# Patient Record
Sex: Female | Born: 1945
Health system: Southern US, Community
[De-identification: ages and names within clinical notes are randomized; demographics above are authoritative.]

## PROBLEM LIST (undated history)

## (undated) DIAGNOSIS — K56609 Unspecified intestinal obstruction, unspecified as to partial versus complete obstruction: Secondary | ICD-10-CM

## (undated) DIAGNOSIS — F329 Major depressive disorder, single episode, unspecified: Secondary | ICD-10-CM

## (undated) DIAGNOSIS — Z87442 Personal history of urinary calculi: Secondary | ICD-10-CM

## (undated) DIAGNOSIS — G43711 Chronic migraine without aura, intractable, with status migrainosus: Secondary | ICD-10-CM

## (undated) DIAGNOSIS — F419 Anxiety disorder, unspecified: Secondary | ICD-10-CM

## (undated) DIAGNOSIS — D72829 Elevated white blood cell count, unspecified: Secondary | ICD-10-CM

## (undated) DIAGNOSIS — M48061 Spinal stenosis, lumbar region without neurogenic claudication: Secondary | ICD-10-CM

## (undated) DIAGNOSIS — I071 Rheumatic tricuspid insufficiency: Secondary | ICD-10-CM

## (undated) DIAGNOSIS — J449 Chronic obstructive pulmonary disease, unspecified: Secondary | ICD-10-CM

## (undated) DIAGNOSIS — G459 Transient cerebral ischemic attack, unspecified: Secondary | ICD-10-CM

## (undated) DIAGNOSIS — E559 Vitamin D deficiency, unspecified: Secondary | ICD-10-CM

## (undated) DIAGNOSIS — M1712 Unilateral primary osteoarthritis, left knee: Secondary | ICD-10-CM

## (undated) DIAGNOSIS — R109 Unspecified abdominal pain: Secondary | ICD-10-CM

## (undated) DIAGNOSIS — R519 Headache, unspecified: Secondary | ICD-10-CM

## (undated) DIAGNOSIS — E538 Deficiency of other specified B group vitamins: Secondary | ICD-10-CM

## (undated) DIAGNOSIS — G5600 Carpal tunnel syndrome, unspecified upper limb: Secondary | ICD-10-CM

## (undated) DIAGNOSIS — K921 Melena: Secondary | ICD-10-CM

## (undated) DIAGNOSIS — I639 Cerebral infarction, unspecified: Secondary | ICD-10-CM

## (undated) DIAGNOSIS — Z7901 Long term (current) use of anticoagulants: Secondary | ICD-10-CM

## (undated) DIAGNOSIS — H269 Unspecified cataract: Secondary | ICD-10-CM

## (undated) DIAGNOSIS — G8929 Other chronic pain: Secondary | ICD-10-CM

## (undated) DIAGNOSIS — K5090 Crohn's disease, unspecified, without complications: Secondary | ICD-10-CM

## (undated) DIAGNOSIS — I700 Atherosclerosis of aorta: Secondary | ICD-10-CM

## (undated) DIAGNOSIS — Z9081 Acquired absence of spleen: Secondary | ICD-10-CM

## (undated) DIAGNOSIS — G4730 Sleep apnea, unspecified: Secondary | ICD-10-CM

## (undated) DIAGNOSIS — K219 Gastro-esophageal reflux disease without esophagitis: Secondary | ICD-10-CM

## (undated) DIAGNOSIS — F32A Depression, unspecified: Secondary | ICD-10-CM

## (undated) DIAGNOSIS — Z86718 Personal history of other venous thrombosis and embolism: Secondary | ICD-10-CM

## (undated) DIAGNOSIS — J189 Pneumonia, unspecified organism: Secondary | ICD-10-CM

## (undated) DIAGNOSIS — M1990 Unspecified osteoarthritis, unspecified site: Secondary | ICD-10-CM

## (undated) DIAGNOSIS — Z8719 Personal history of other diseases of the digestive system: Secondary | ICD-10-CM

## (undated) HISTORY — DX: Unspecified intestinal obstruction, unspecified as to partial versus complete obstruction: K56.609

## (undated) HISTORY — PX: APPENDECTOMY: SHX54

## (undated) HISTORY — PX: VAGINAL HYSTERECTOMY: SHX2639

## (undated) HISTORY — DX: Personal history of other venous thrombosis and embolism: Z86.718

## (undated) HISTORY — PX: CHOLECYSTECTOMY: SHX55

## (undated) HISTORY — PX: TONSILLECTOMY: SUR1361

## (undated) HISTORY — PX: CARPAL TUNNEL RELEASE: SHX101

## (undated) HISTORY — DX: Unspecified abdominal pain: R10.9

## (undated) HISTORY — PX: GIVENS CAPSULE STUDY: SHX5432

## (undated) HISTORY — PX: ESOPHAGOGASTRODUODENOSCOPY: SHX1529

## (undated) HISTORY — DX: Crohn's disease, unspecified, without complications: K50.90

## (undated) HISTORY — PX: ROTATOR CUFF REPAIR: SHX139

## (undated) HISTORY — PX: COLONOSCOPY: SHX174

## (undated) HISTORY — PX: INSERTION OF VENA CAVA FILTER: SHX5871

## (undated) HISTORY — PX: TONSILLECTOMY AND ADENOIDECTOMY: SUR1326

## (undated) HISTORY — PX: NISSEN FUNDOPLICATION: SHX2091

## (undated) HISTORY — PX: CATARACT EXTRACTION: SUR2

## (undated) HISTORY — PX: OTHER SURGICAL HISTORY: SHX169

## (undated) HISTORY — PX: ABDOMINAL HYSTERECTOMY: SHX81

## (undated) HISTORY — PX: ANAL SPHINCTER PROSTHESIS PLACEMENT: SUR1036

## (undated) HISTORY — PX: BACK SURGERY: SHX140

## (undated) HISTORY — DX: Other chronic pain: G89.29

## (undated) HISTORY — PX: SPLENECTOMY: SUR1306

---

## 1997-06-14 DIAGNOSIS — Z9889 Other specified postprocedural states: Secondary | ICD-10-CM

## 1997-06-14 HISTORY — DX: Other specified postprocedural states: Z98.890

## 1997-06-14 HISTORY — PX: CARDIAC CATHETERIZATION: SHX172

## 1999-11-12 DIAGNOSIS — K56609 Unspecified intestinal obstruction, unspecified as to partial versus complete obstruction: Secondary | ICD-10-CM

## 1999-11-12 HISTORY — DX: Unspecified intestinal obstruction, unspecified as to partial versus complete obstruction: K56.609

## 2000-08-28 ENCOUNTER — Encounter: Admission: RE | Admit: 2000-08-28 | Discharge: 2000-08-28 | Payer: Self-pay | Admitting: Family Medicine

## 2000-08-28 ENCOUNTER — Encounter: Payer: Self-pay | Admitting: Family Medicine

## 2001-01-27 ENCOUNTER — Encounter: Payer: Self-pay | Admitting: Family Medicine

## 2001-01-27 ENCOUNTER — Ambulatory Visit (HOSPITAL_COMMUNITY): Admission: RE | Admit: 2001-01-27 | Discharge: 2001-01-27 | Payer: Self-pay | Admitting: Family Medicine

## 2003-04-06 ENCOUNTER — Ambulatory Visit (HOSPITAL_COMMUNITY): Admission: RE | Admit: 2003-04-06 | Discharge: 2003-04-06 | Payer: Self-pay | Admitting: Urology

## 2003-04-06 ENCOUNTER — Encounter: Payer: Self-pay | Admitting: Urology

## 2003-11-12 HISTORY — PX: CERVICAL FUSION: SHX112

## 2004-08-11 ENCOUNTER — Ambulatory Visit: Payer: Self-pay | Admitting: Internal Medicine

## 2004-08-16 ENCOUNTER — Ambulatory Visit: Payer: Self-pay

## 2004-08-19 ENCOUNTER — Emergency Department: Payer: Self-pay | Admitting: Emergency Medicine

## 2004-09-13 ENCOUNTER — Emergency Department (HOSPITAL_COMMUNITY): Admission: EM | Admit: 2004-09-13 | Discharge: 2004-09-13 | Payer: Self-pay | Admitting: Family Medicine

## 2004-09-19 ENCOUNTER — Ambulatory Visit: Payer: Self-pay | Admitting: Specialist

## 2004-11-01 ENCOUNTER — Ambulatory Visit (HOSPITAL_COMMUNITY): Admission: RE | Admit: 2004-11-01 | Discharge: 2004-11-01 | Payer: Self-pay | Admitting: Neurosurgery

## 2004-11-07 ENCOUNTER — Inpatient Hospital Stay (HOSPITAL_COMMUNITY): Admission: RE | Admit: 2004-11-07 | Discharge: 2004-11-09 | Payer: Self-pay | Admitting: Neurosurgery

## 2004-12-24 ENCOUNTER — Inpatient Hospital Stay: Payer: Self-pay | Admitting: Unknown Physician Specialty

## 2004-12-30 ENCOUNTER — Ambulatory Visit: Payer: Self-pay | Admitting: Internal Medicine

## 2005-01-22 ENCOUNTER — Ambulatory Visit: Payer: Self-pay | Admitting: Unknown Physician Specialty

## 2005-02-18 ENCOUNTER — Ambulatory Visit (HOSPITAL_BASED_OUTPATIENT_CLINIC_OR_DEPARTMENT_OTHER): Admission: RE | Admit: 2005-02-18 | Discharge: 2005-02-18 | Payer: Self-pay | Admitting: Orthopedic Surgery

## 2005-05-04 ENCOUNTER — Inpatient Hospital Stay: Payer: Self-pay | Admitting: Gastroenterology

## 2005-08-07 ENCOUNTER — Ambulatory Visit: Payer: Self-pay | Admitting: Unknown Physician Specialty

## 2005-08-28 ENCOUNTER — Ambulatory Visit: Payer: Self-pay | Admitting: Unknown Physician Specialty

## 2005-10-18 ENCOUNTER — Inpatient Hospital Stay: Payer: Self-pay | Admitting: Unknown Physician Specialty

## 2006-01-29 ENCOUNTER — Ambulatory Visit: Payer: Self-pay | Admitting: Unknown Physician Specialty

## 2006-02-10 ENCOUNTER — Inpatient Hospital Stay: Payer: Self-pay | Admitting: Unknown Physician Specialty

## 2006-02-25 IMAGING — CT CT HEAD WITHOUT AND WITH CONTRAST
1 series · 16 of 29 positions shown, 20 images · non-contrast
Comparison: none

REASON FOR EXAM: Headache, neuralgia, neuritis
COMMENTS:

[Series 2: without 5.0 h40s · axial · non-contrast · 0.39mm/px · z∈[-42,+88]mm · 16 of 29 slices shown, 20 images]
[im 2/29  brain]
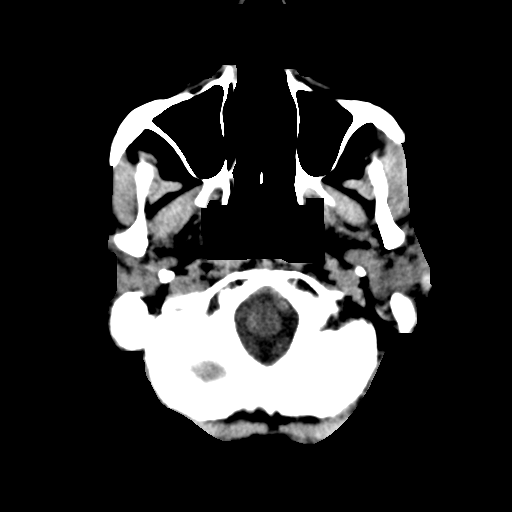
[im 2/29  bone]
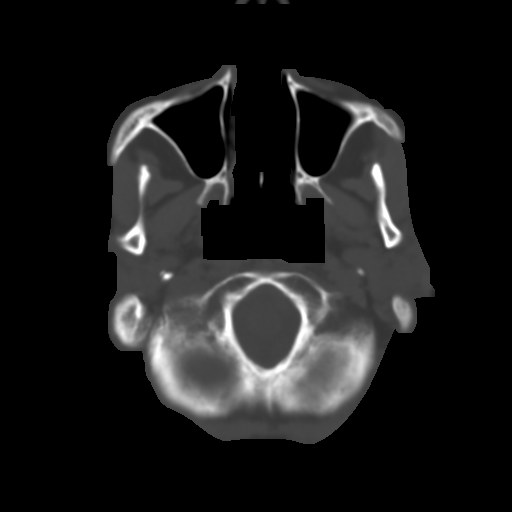
[im 4/29  brain]
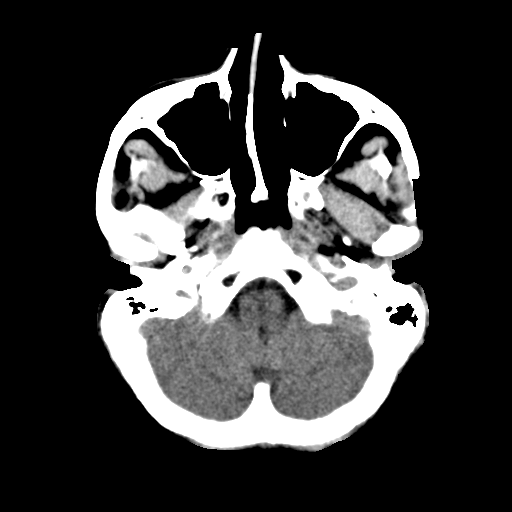
[im 6/29  brain]
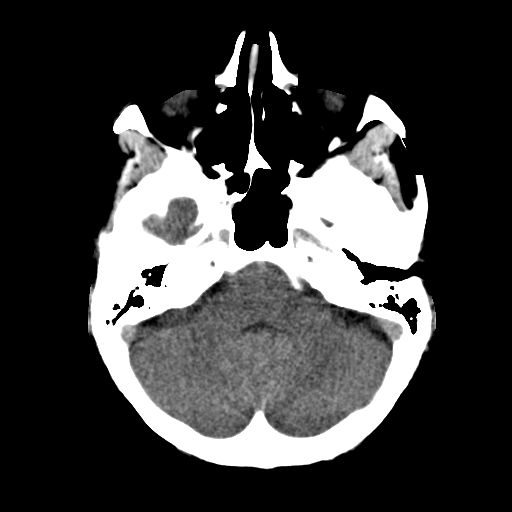
[im 7/29  brain]
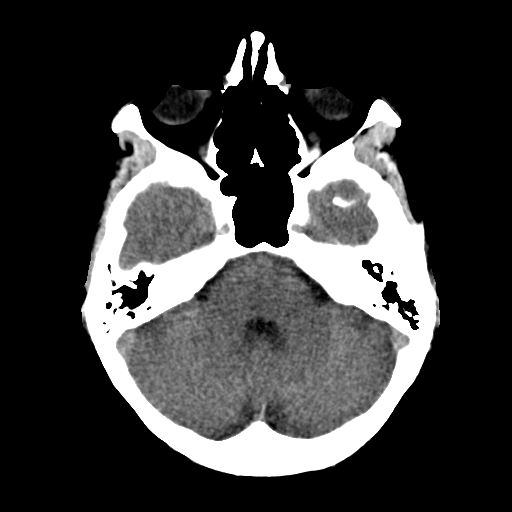
[im 9/29  brain]
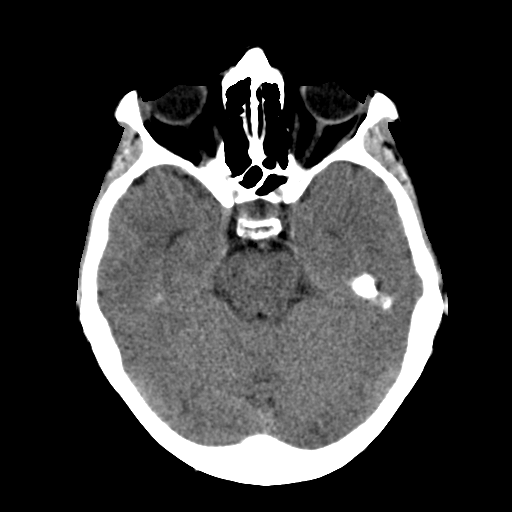
[im 9/29  bone]
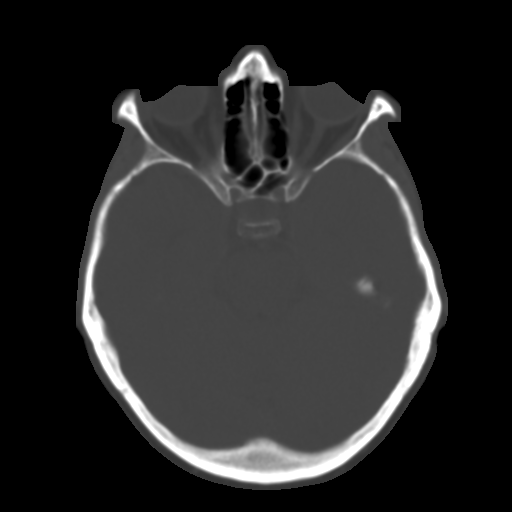
[im 11/29  brain]
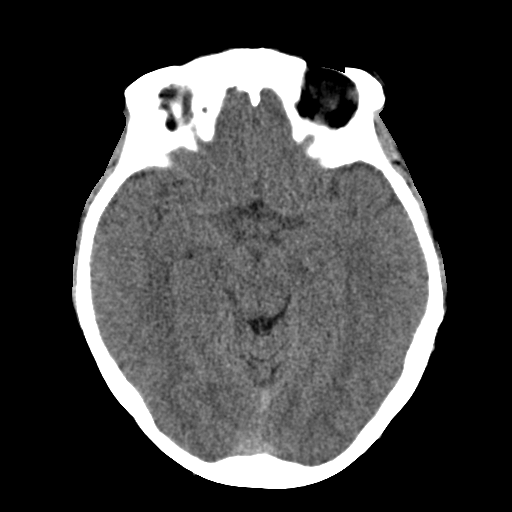
[im 12/29  brain]
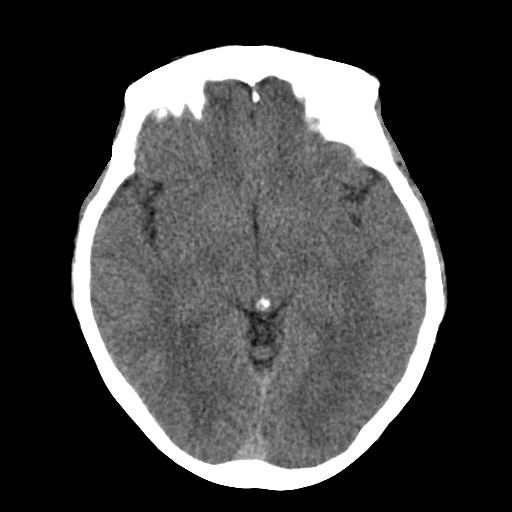
[im 14/29  brain]
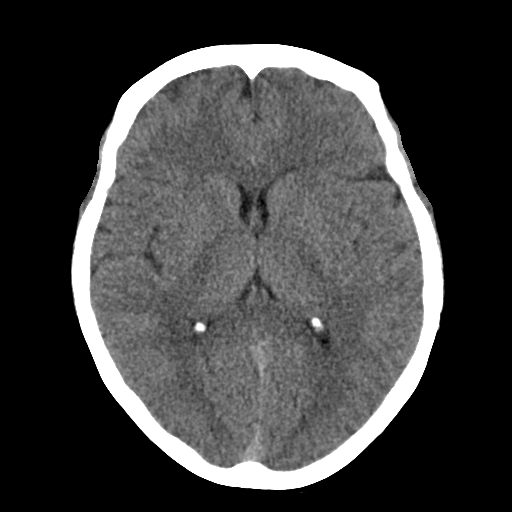
[im 16/29  brain]
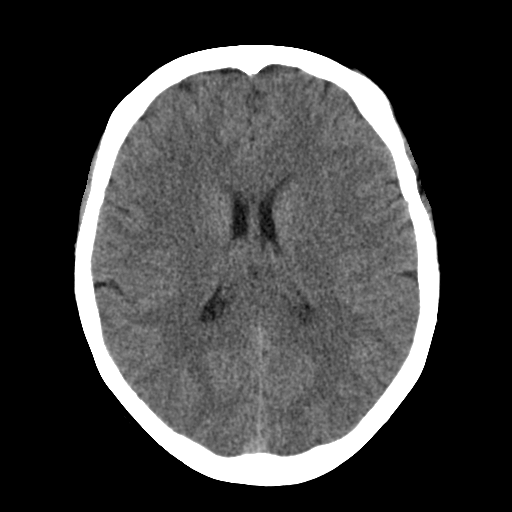
[im 16/29  bone]
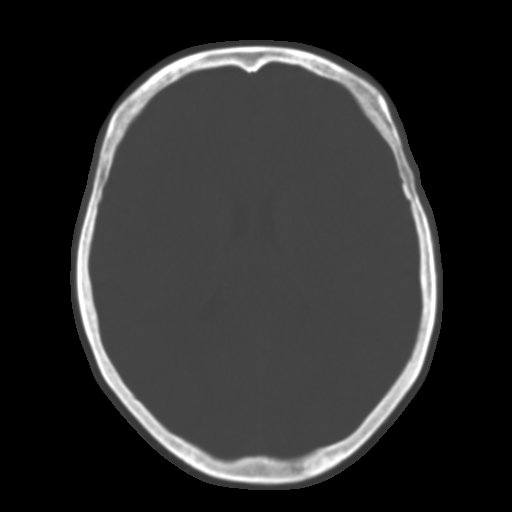
[im 18/29  brain]
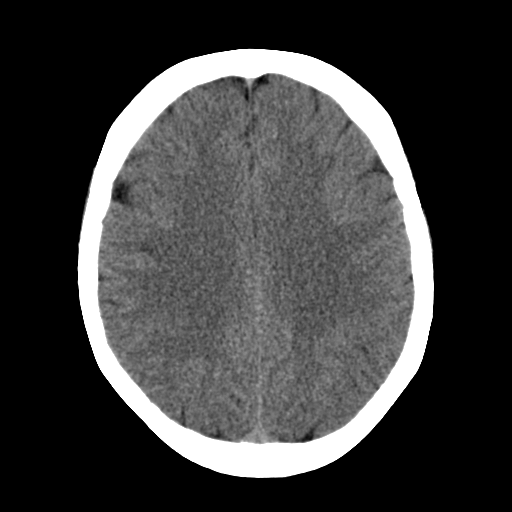
[im 19/29  brain]
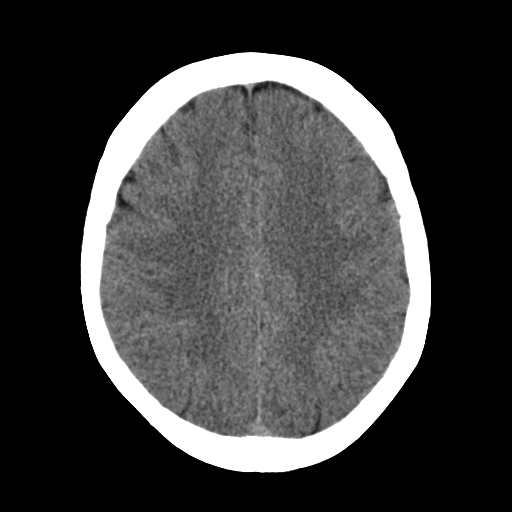
[im 21/29  brain]
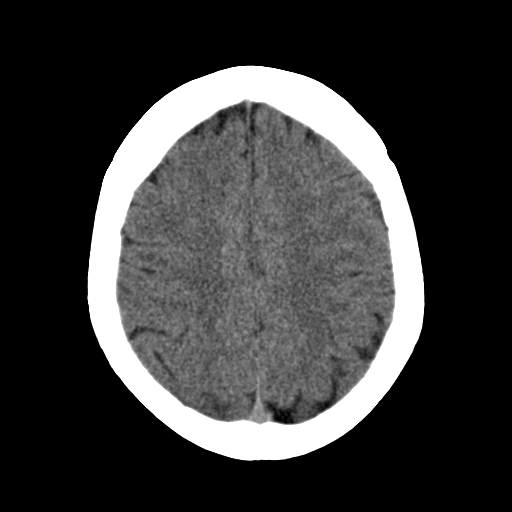
[im 23/29  brain]
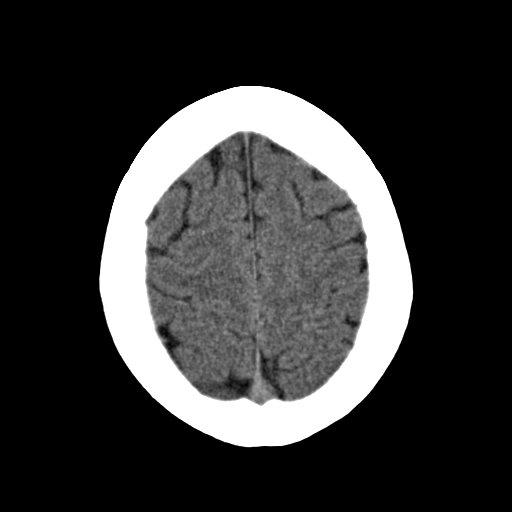
[im 23/29  bone]
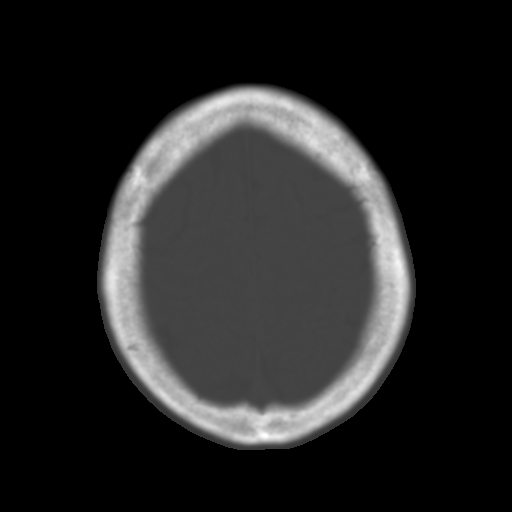
[im 24/29  brain]
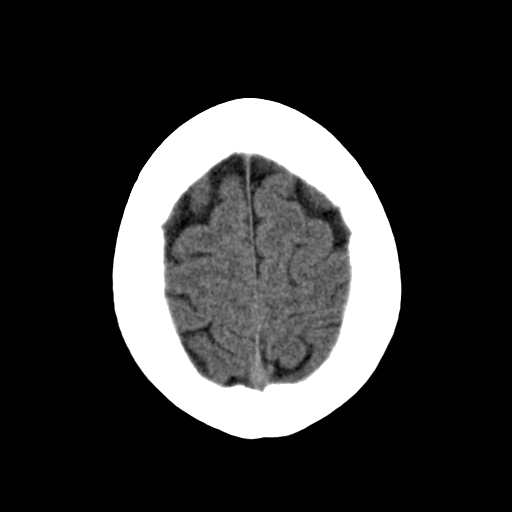
[im 26/29  brain]
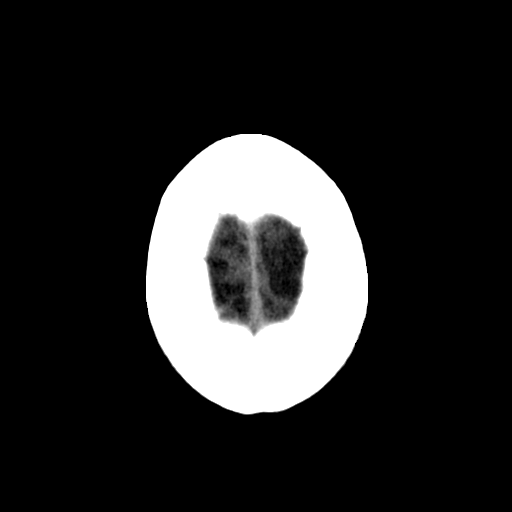
[im 28/29  brain]
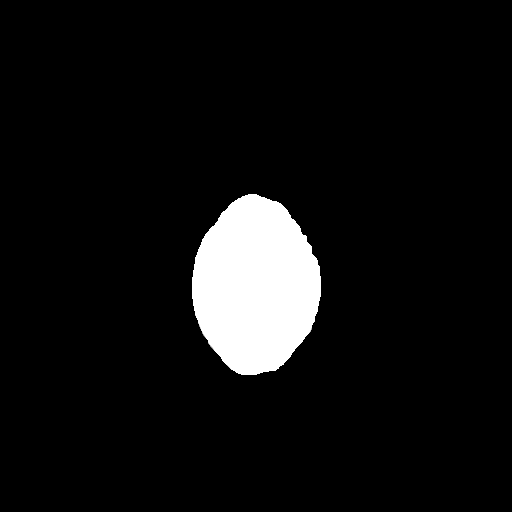

[16 of 29 positions shown; findings below may reference images not displayed]

PROCEDURE:     CT  - CT HEAD W/WO  - August 16, 2004 [DATE]

RESULT:     There is no evidence of intraaxial or extraaxial fluid
collections or evidence of acute hemorrhage.  No secondary signs are
appreciated to suggest mass effect, subacute or chronic infarction.
Evaluation of the brain parenchyma status post administration of intravenous
contrast demonstrates no evidence of abnormal parenchymal enhancement or
enhancing masses.
IMPRESSION: Unremarkable pre and post contrast head CT as described above.

## 2006-02-25 IMAGING — CT CT NECK WITH CONTRAST
1 of 3 series · 7 of 14 positions shown, 9 images · non-contrast
Comparison: none

REASON FOR EXAM: HA neuralgia, neuritis
COMMENTS:

[Series 3: neck soft tissue · axial · 0.38mm/px · z∈[-201,-30]mm · 7 of 76 slices shown, 9 images]
[im 10/76  soft-tissue]
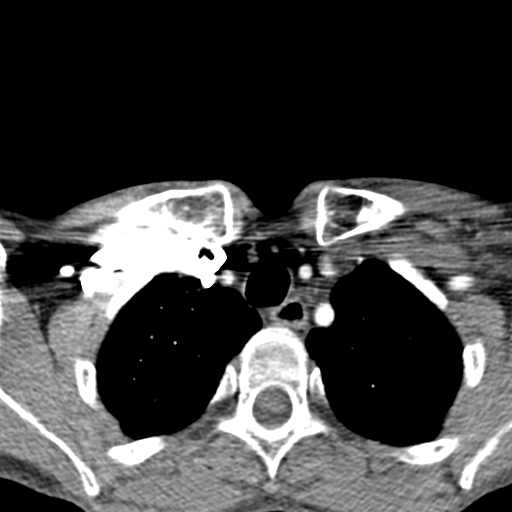
[im 10/76  bone]
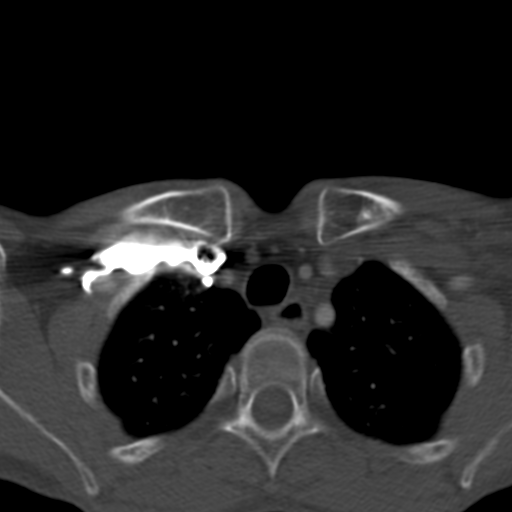
[im 19/76  bone]
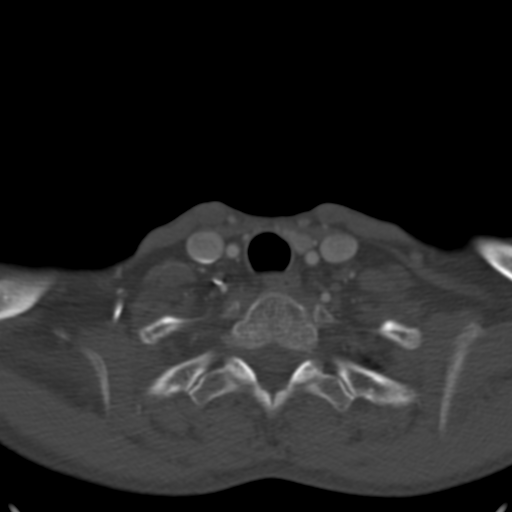
[im 29/76  bone]
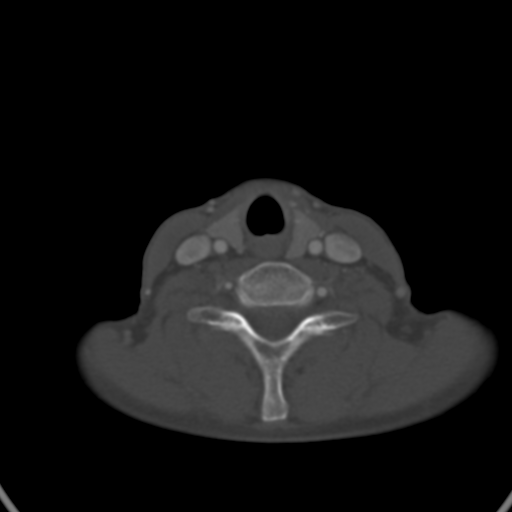
[im 38/76  bone]
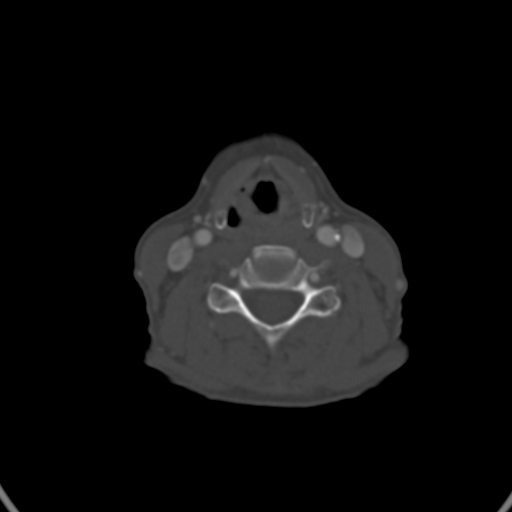
[im 47/76  soft-tissue]
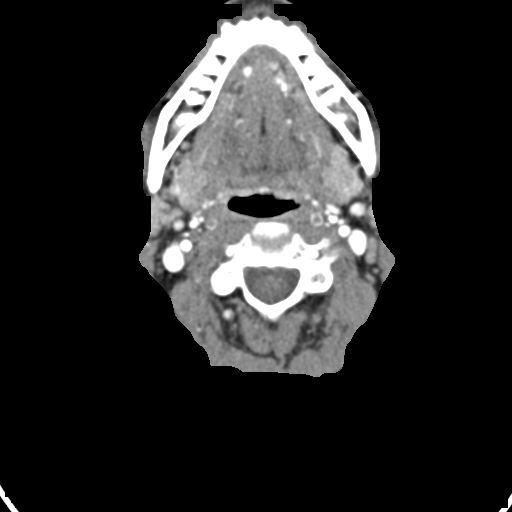
[im 47/76  bone]
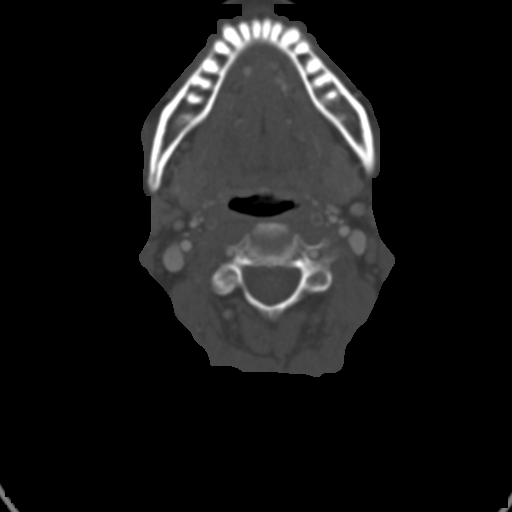
[im 57/76  bone]
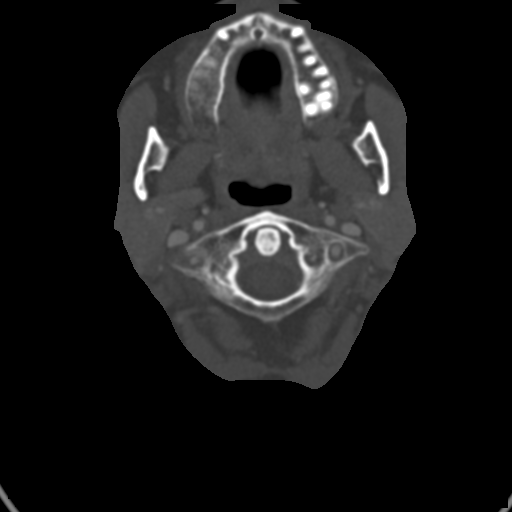
[im 66/76  bone]
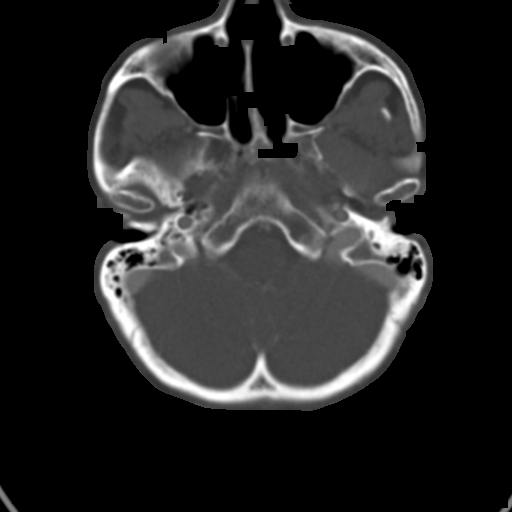

[7 of 14 positions shown; findings below may reference images not displayed]

PROCEDURE:     CT  - CT NECK WITH CONTRAST  - August 16, 2004 [DATE]

RESULT:       Axial 3 mm sections were obtained from the skull base through
the thoracic inlet.  This was performed status post intravenous
administration of 75 cc of Isovue 370.  Evaluation of the neck demonstrates
no evidence of free fluid, drainable loculated fluid collection nor masses.
A 7 mm lymph node is demonstrated within the RIGHT parotid space laterally.
The opacified vascular structures appear unremarkable.  Evaluation of the
lung apices demonstrates no evidence of focal infiltrates, effusions, edema,
masses or nodules.
IMPRESSION: RIGHT 7 mm lymph node in the parotid space.  Otherwise no further evidence
of masses, nodules, free fluid or drainable loculated fluid collections.

## 2006-03-25 IMAGING — CR DG HAND COMPLETE 3+V*R*
3 series · 3 of 3 positions shown · non-contrast
Comparison: none

CLINICAL DATA: Twisted hand with pain. 
 RIGHT HAND 3-VIEW:
 Three views of the right hand were obtained.  No acute fracture is seen.  Alignment is normal.

[view not recorded (1 of 3)]
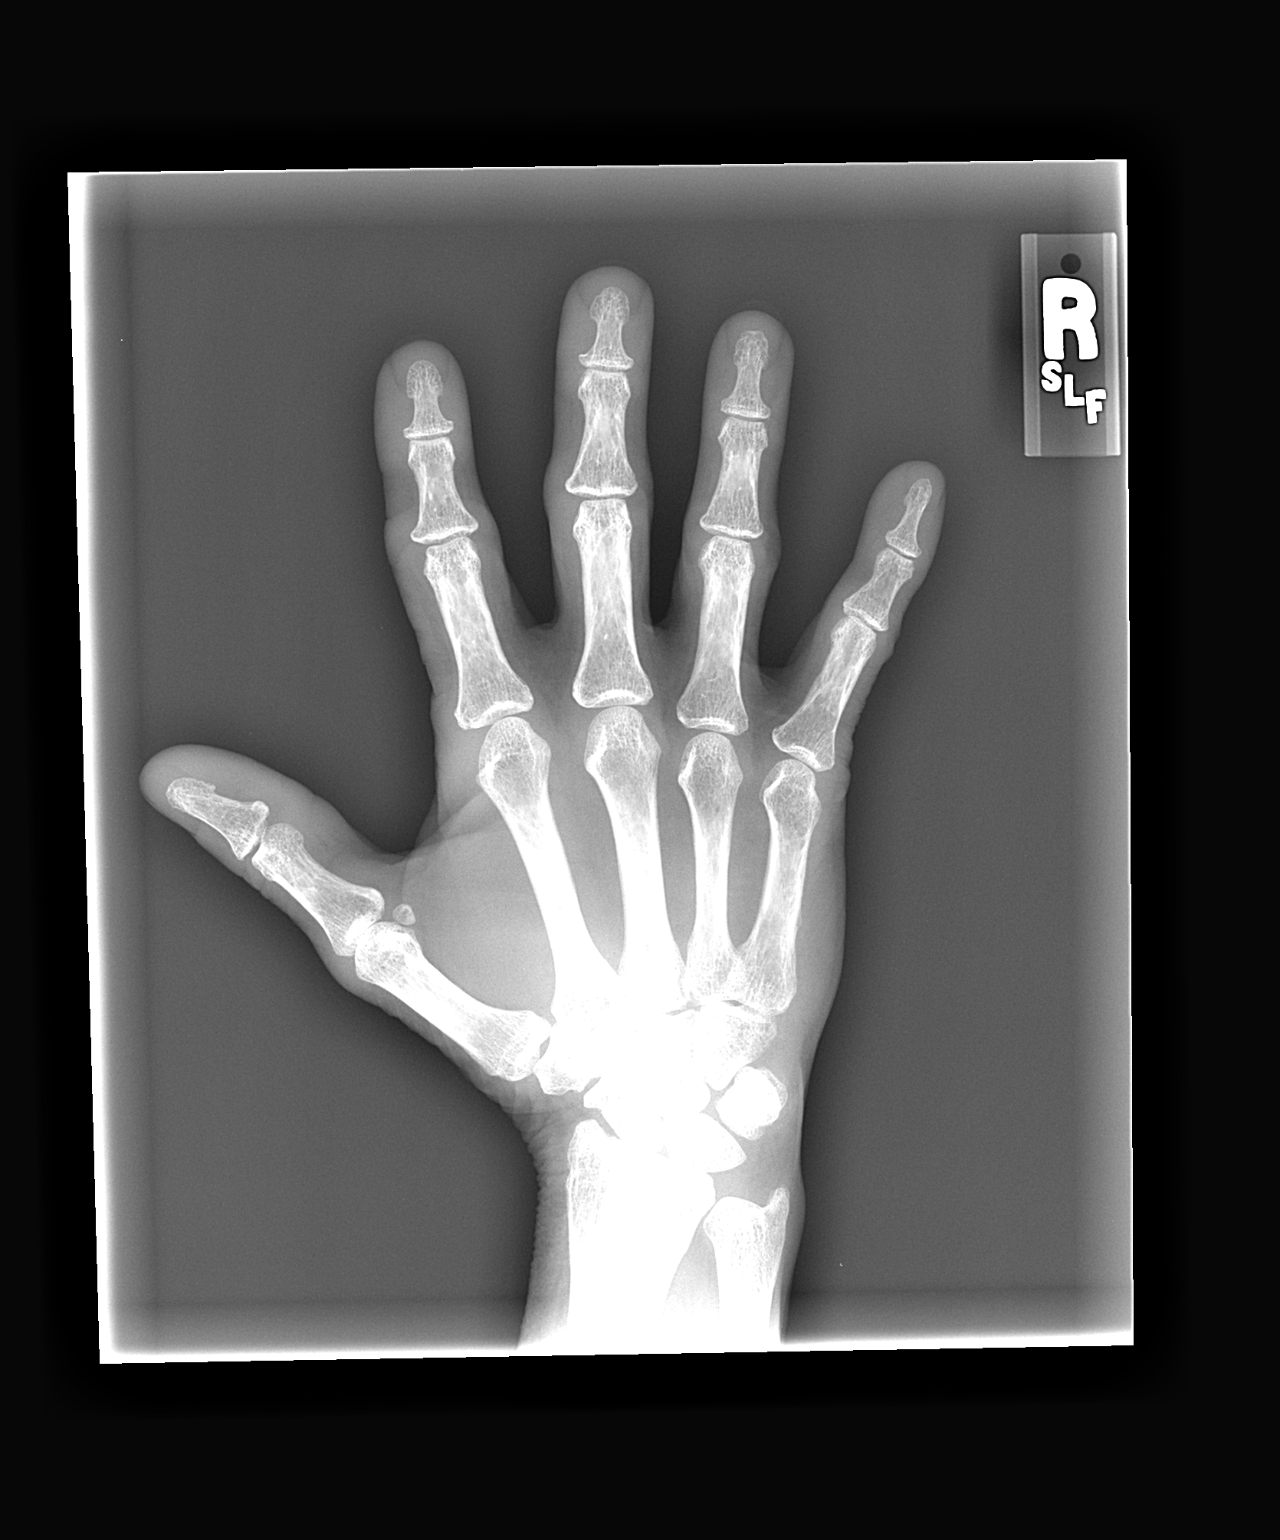

[view not recorded (2 of 3)]
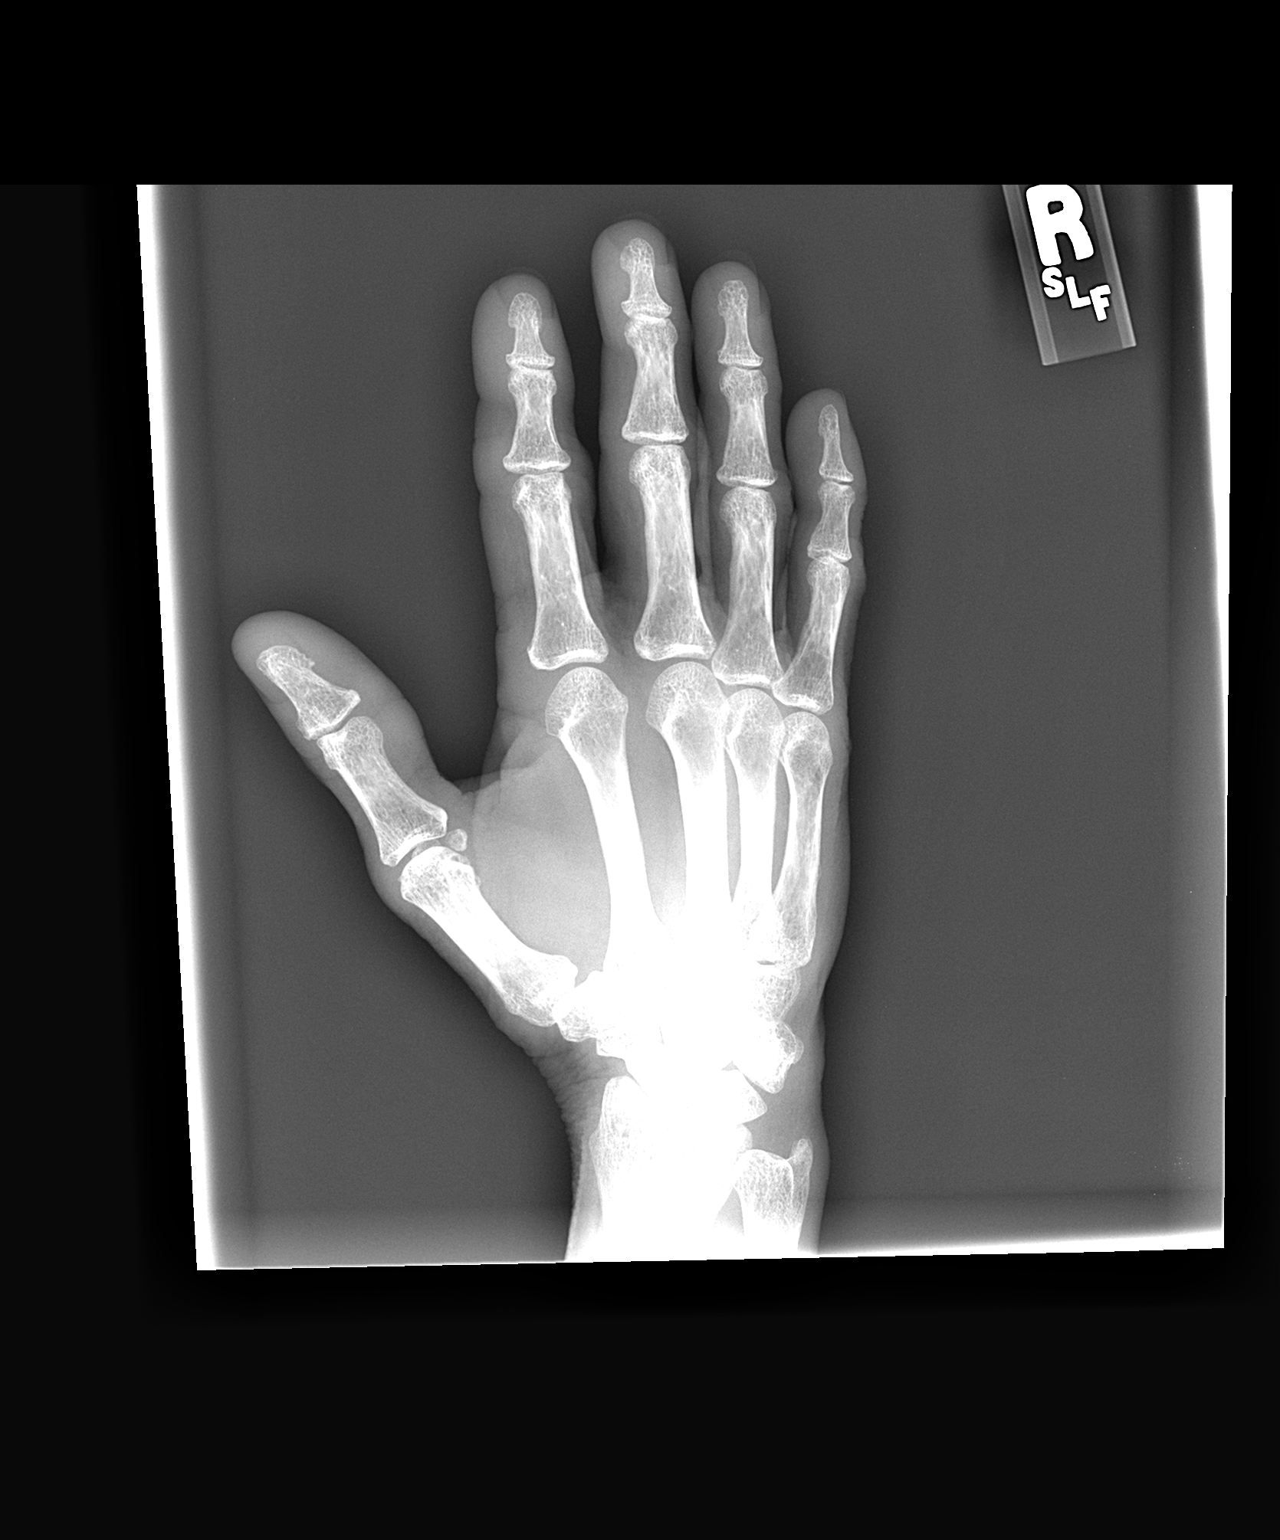

[view not recorded (3 of 3)]
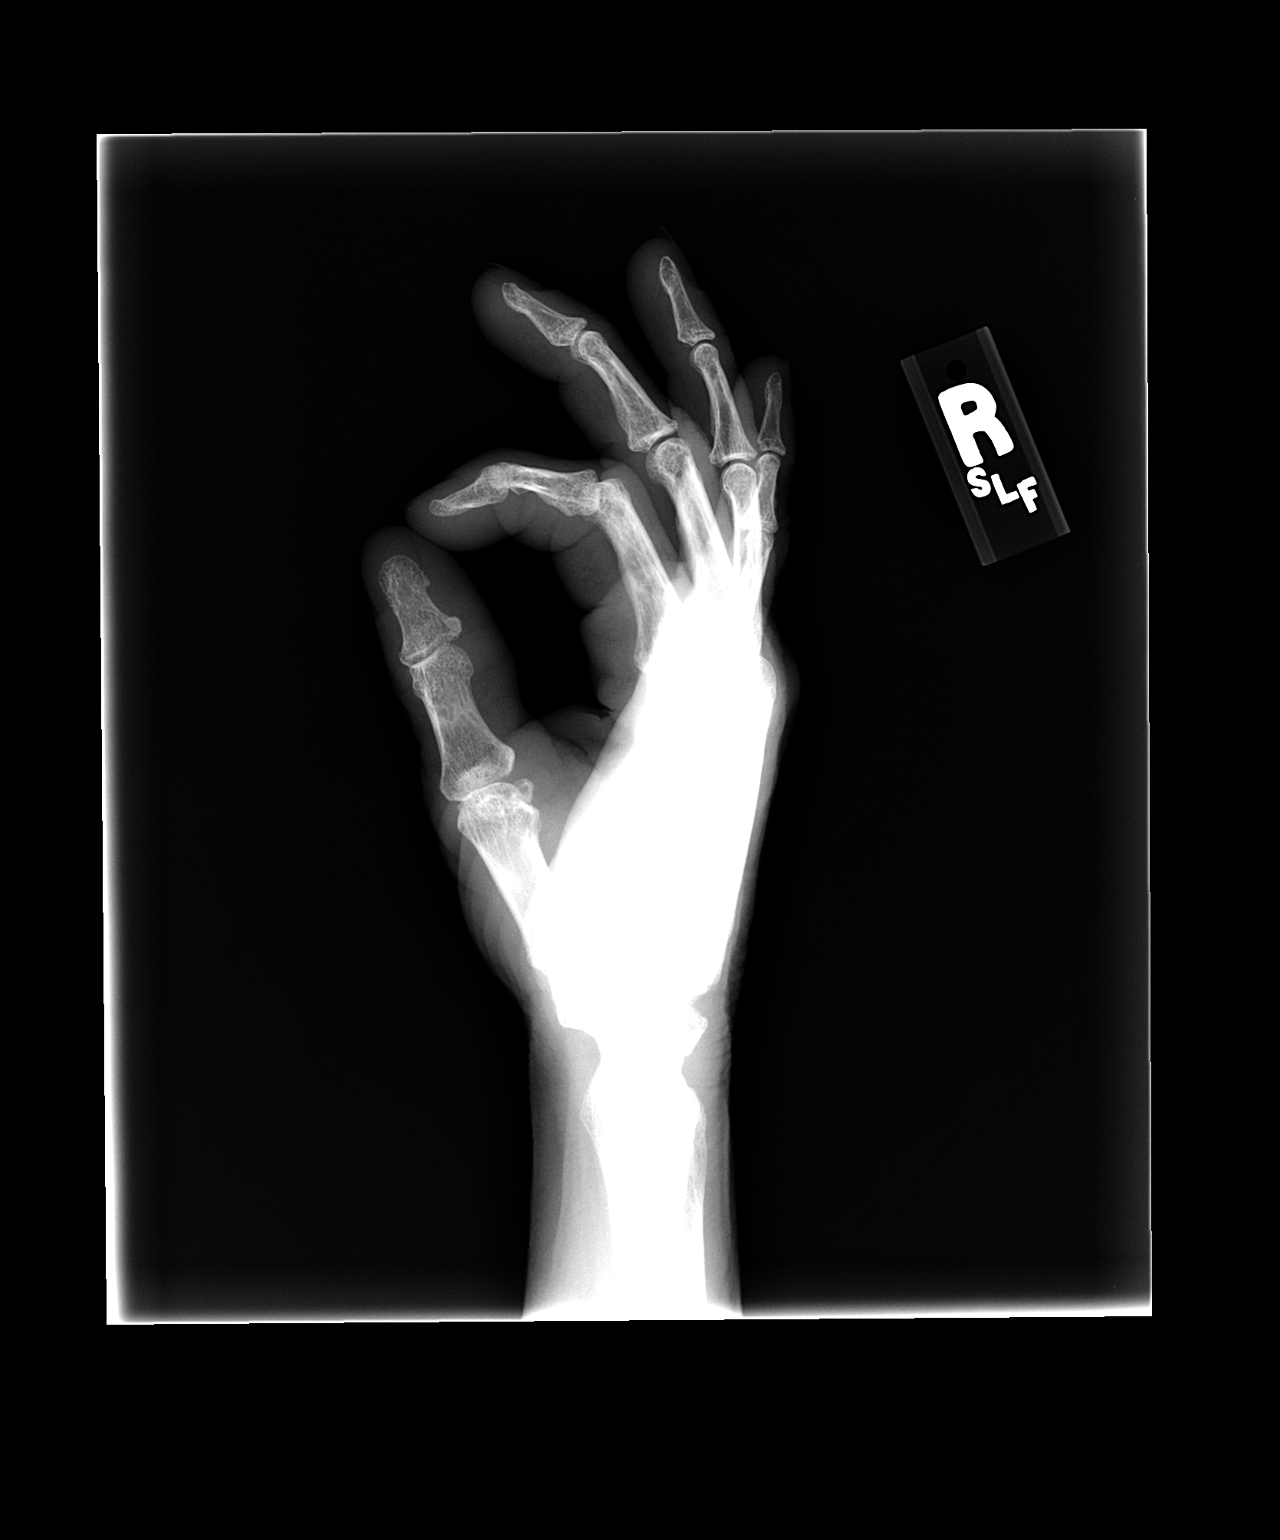

[3 of 3 positions shown; findings below may reference images not displayed]

IMPRESSION: Negative right hand.

## 2006-03-31 IMAGING — MR MRI CERVICAL SPINE WITHOUT CONTRAST
6 of 10 series · 23 of 48 positions shown · non-contrast
Comparison: none

REASON FOR EXAM: Headaches, LEFT arm numbness and pain.
COMMENTS:  [id number]

[Series 10: T2 · sagittal · 3.0mm · 0.81mm/px · 4 of 11 slices shown (1 of 2)]
[im 1/11]
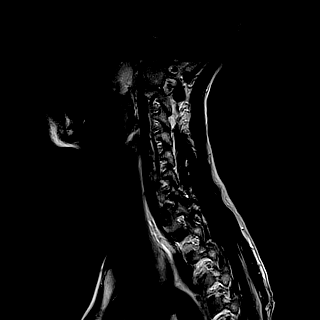
[im 4/11]
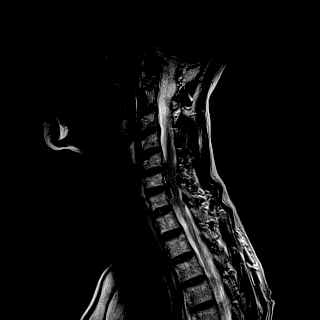
[im 7/11]
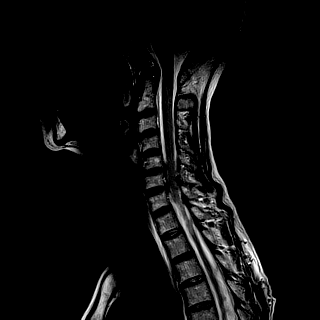
[im 11/11]
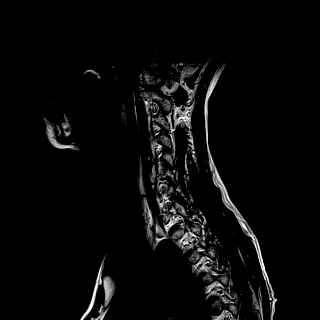

[Series 5001: T2 · sagittal · 3.0mm · 0.81mm/px · 2 of 4 slices shown (2 of 2)]
[im 1/4]
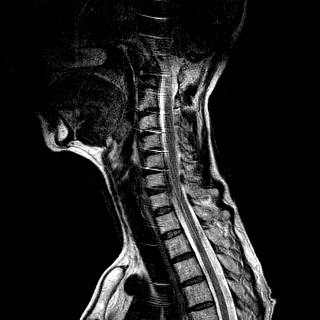
[im 4/4]
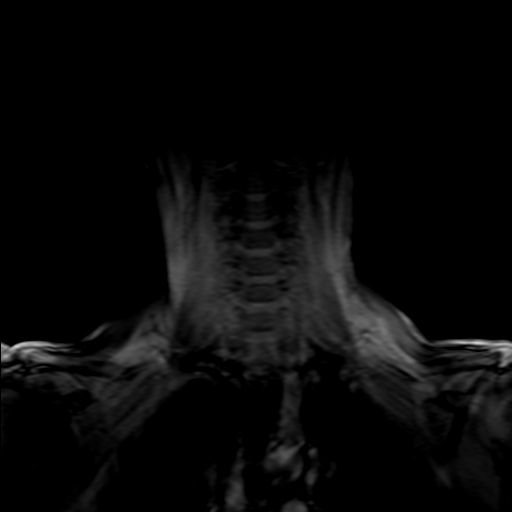

[Series 5002: t1_tse_sag_cv_1 · sagittal · 3.0mm · 0.51mm/px · 5 of 11 slices shown]
[im 1/11]
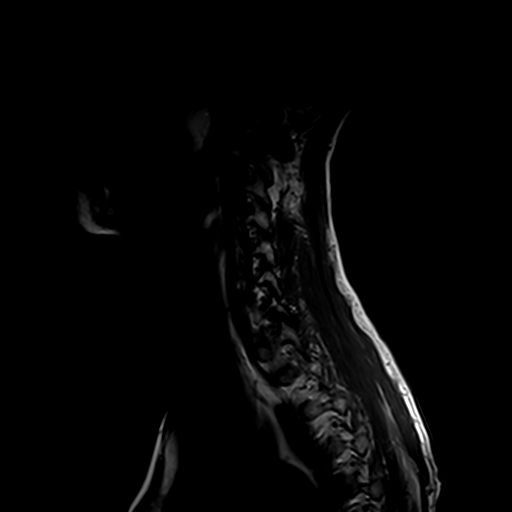
[im 3/11]
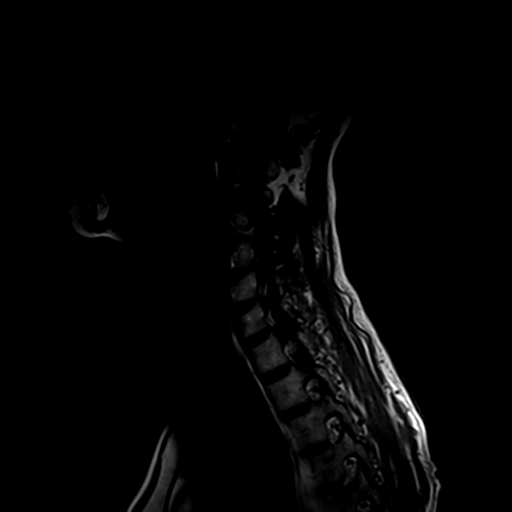
[im 6/11]
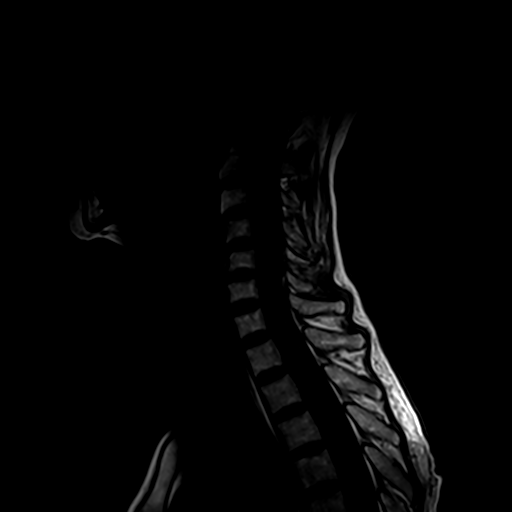
[im 8/11]
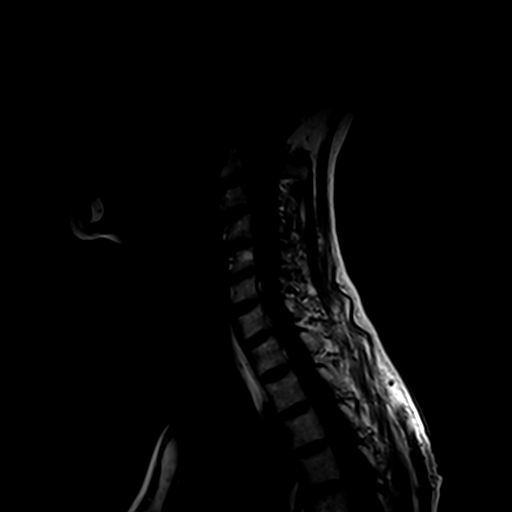
[im 11/11]
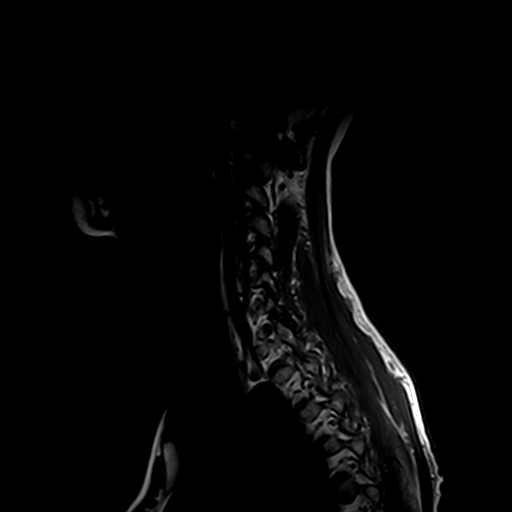

[Series 5003: posdisp: ((id)) t1_tse_sag_cv_1 · sagittal · 3.0mm · 0.81mm/px · 2 of 4 slices shown]
[im 1/4]
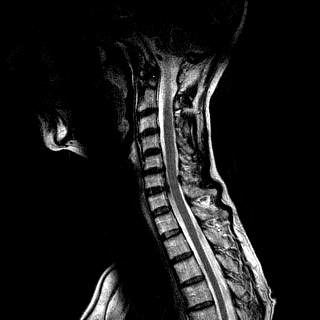
[im 4/4]
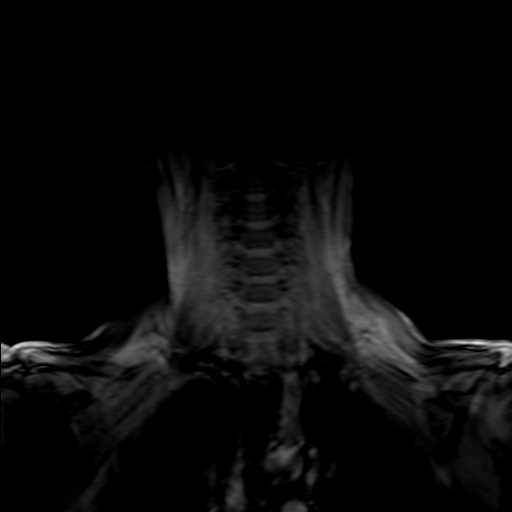

[Series 5004: t2_me2d_ax_pat2_cv_1 · axial · 3.0mm · 0.39mm/px · z∈[-76,+15]mm · 8 of 25 slices shown]
[im 1/25]
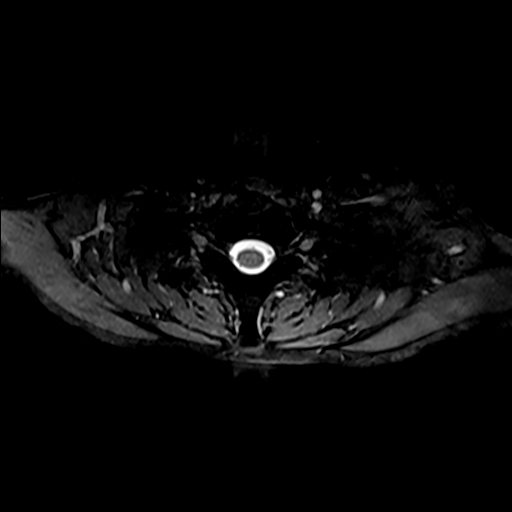
[im 5/25]
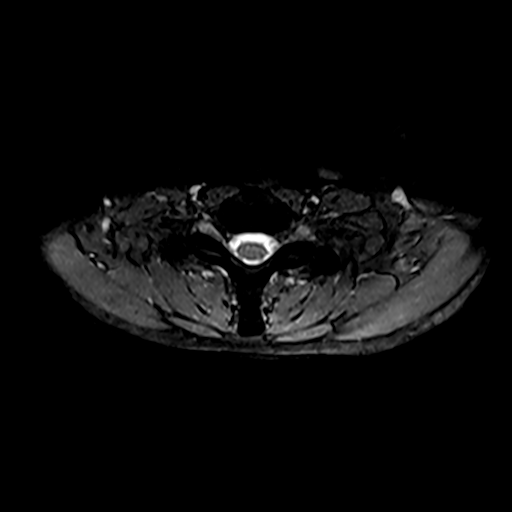
[im 7/25]
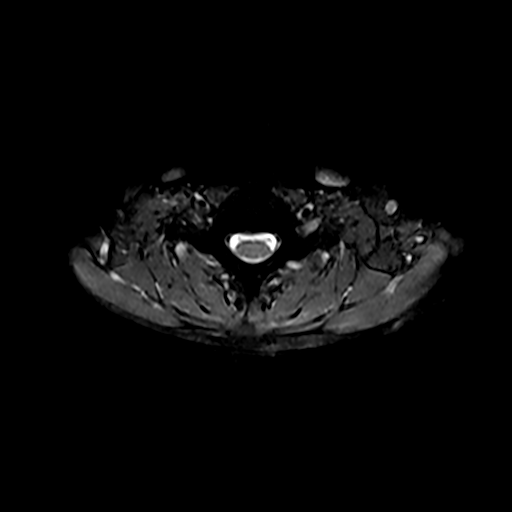
[im 11/25]
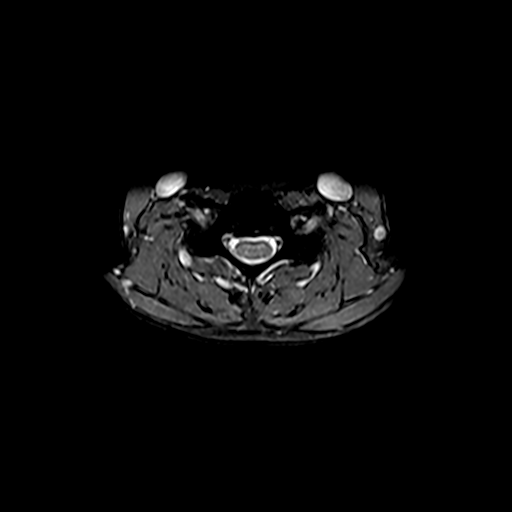
[im 14/25]
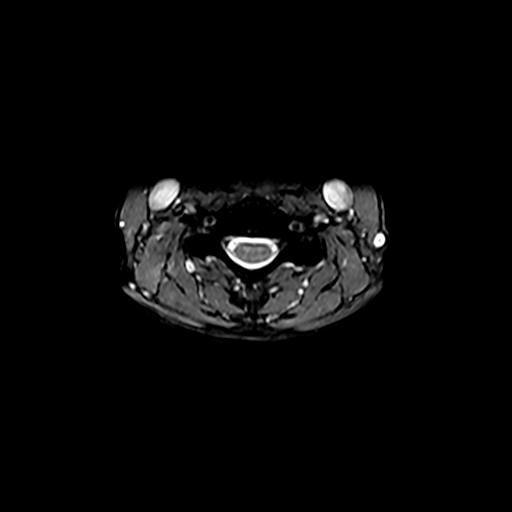
[im 18/25]
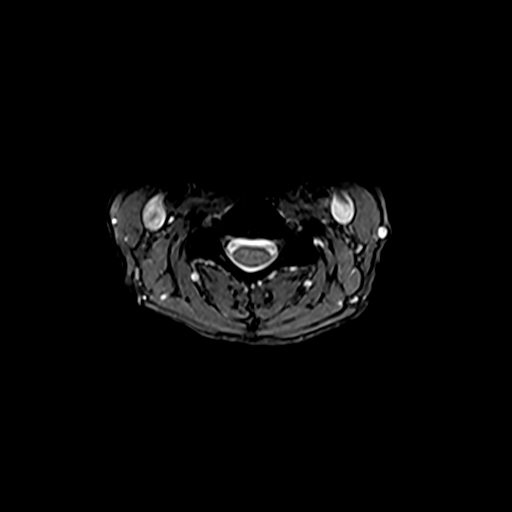
[im 20/25]
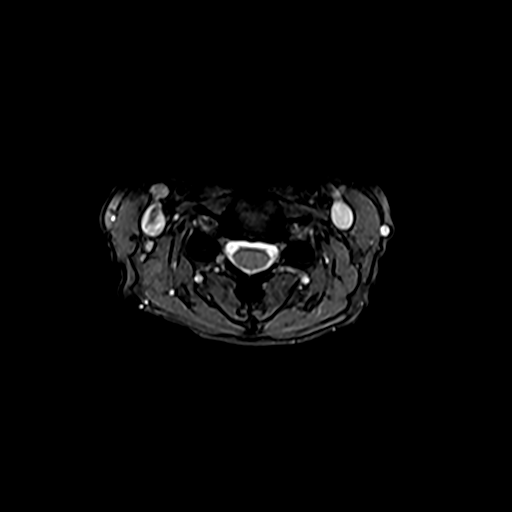
[im 25/25]
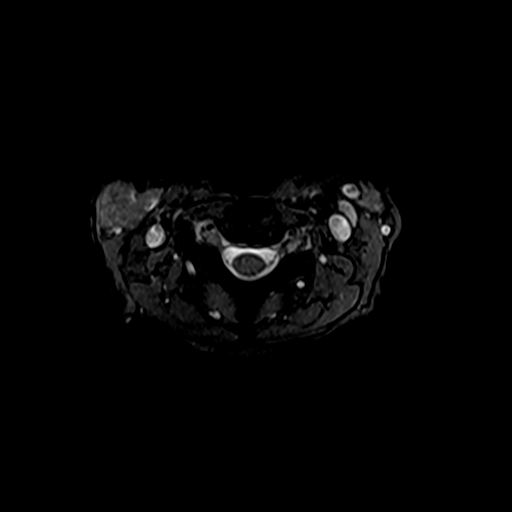

[Series 5005: t1_tse_ax_cv_1 · axial · 3.0mm · 0.35mm/px · z∈[-75,-60]mm · 2 of 25 slices shown]
[im 1/25]
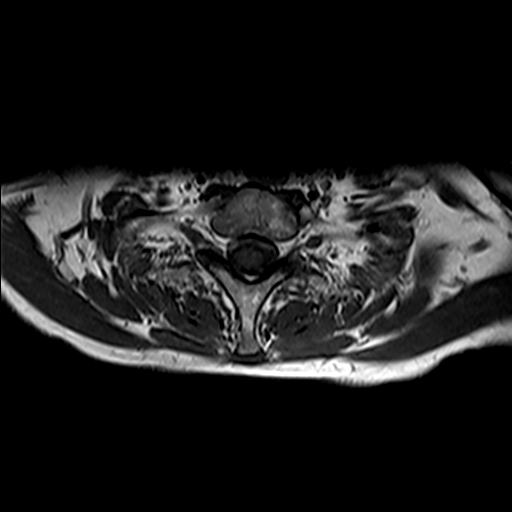
[im 5/25]
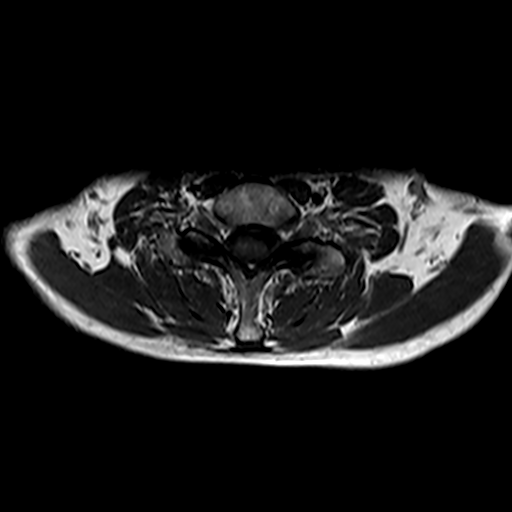

[23 of 48 positions shown; findings below may reference images not displayed]

PROCEDURE:     MR  - MR CERVICAL SPINE WO CONT  - September 19, 2004  [DATE]

RESULT:     Routine pulsing imaging sequences were utilized in the sagittal
and axial planes.

There is noted a herniated disk in the midline and to the RIGHT of the
midline at C6-7 with slight impingement upon the cervical cord and
associated mild stenosis.  No additional herniated disks or spinal stenoses
are seen.

The bone marrow signal appears within normal limits.
IMPRESSION: Herniated disk at C6-7.

## 2006-04-03 ENCOUNTER — Inpatient Hospital Stay: Payer: Self-pay | Admitting: Internal Medicine

## 2006-05-13 IMAGING — MR MR [PERSON_NAME] UP JT W/O CM*L*
5 of 11 series · 19 of 40 positions shown · IV contrast (agent unspecified)
Comparison: none

CLINICAL DATA: Shoulder pain.  No known injury.
MRI OF THE LEFT SHOULDER WITHOUT CONTRAST:
No comparison exams.

[Series 2: PD · axial · 4.0mm · 0.27mm/px · z∈[-57,+18]mm · 6 of 32 slices shown (1 of 5)]
[im 1/32]
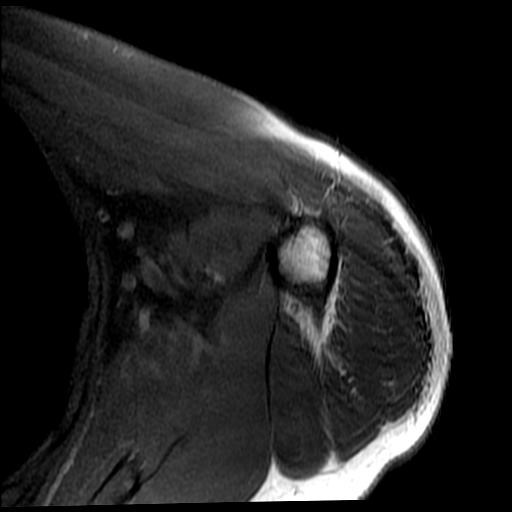
[im 7/32]
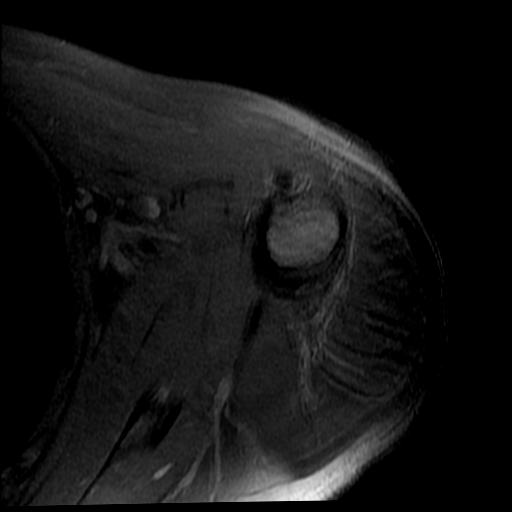
[im 13/32]
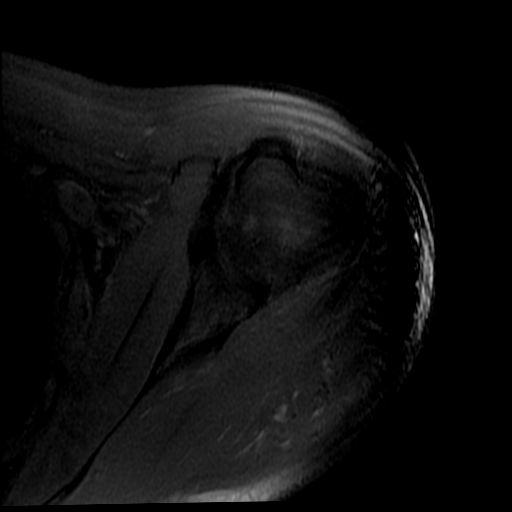
[im 19/32]
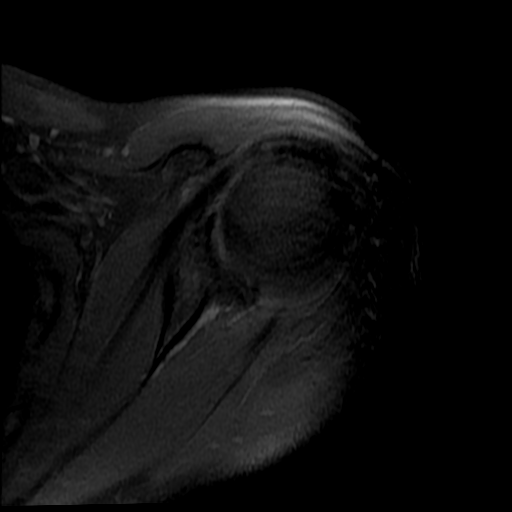
[im 25/32]
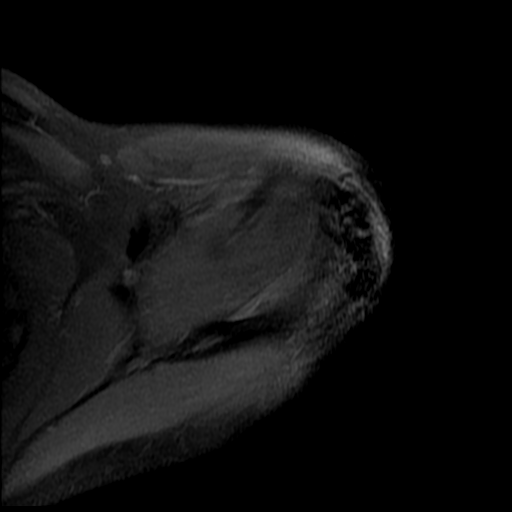
[im 32/32]
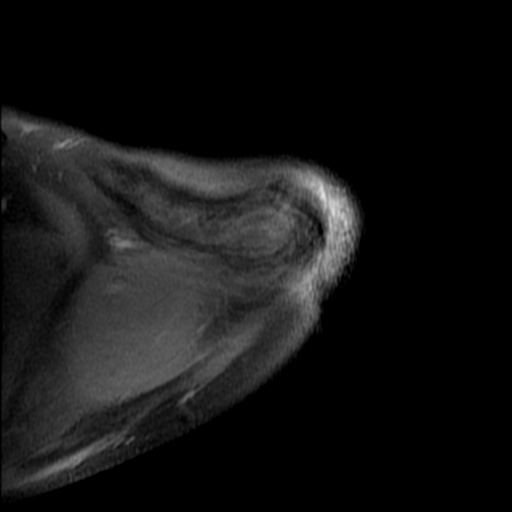

[Series 5: PD · axial · 4.0mm · 0.27mm/px · z∈[-57,+18]mm · 6 of 32 slices shown (2 of 5)]
[im 1/32]
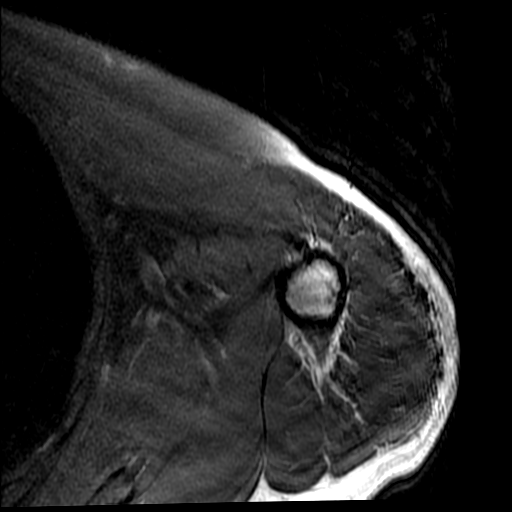
[im 7/32]
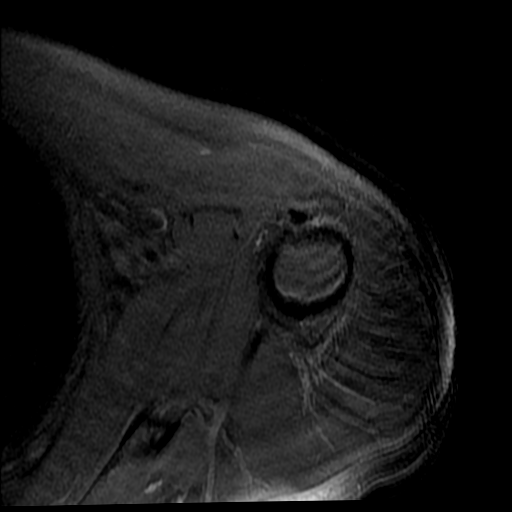
[im 13/32]
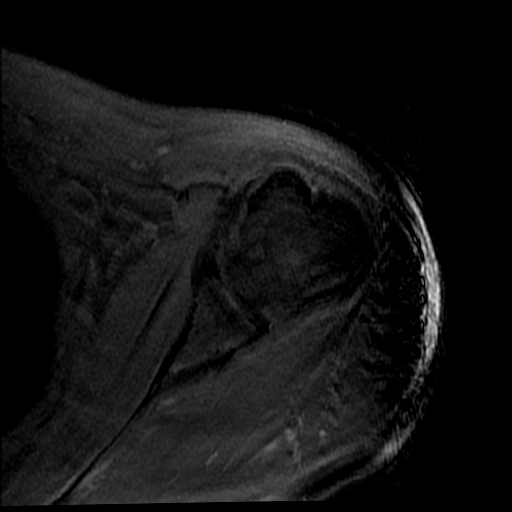
[im 19/32]
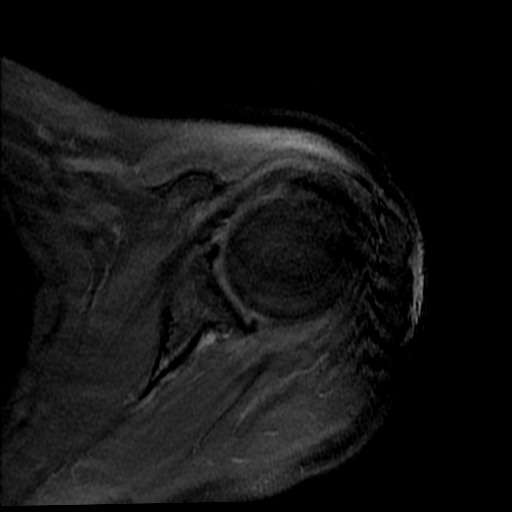
[im 25/32]
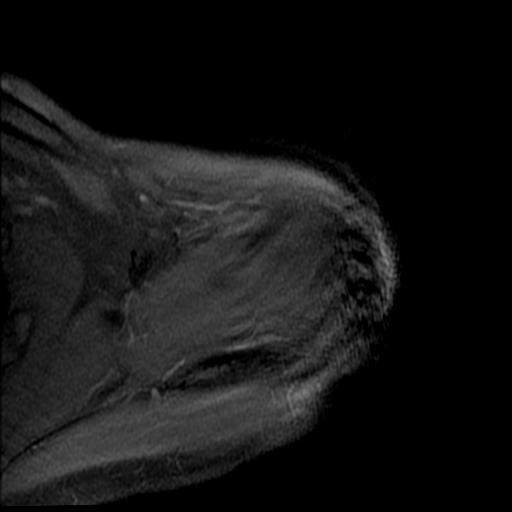
[im 32/32]
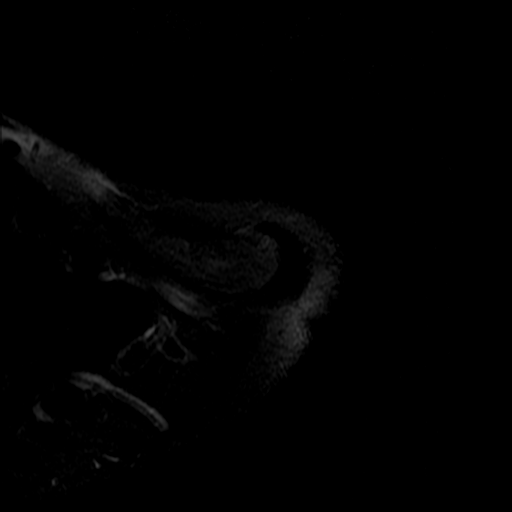

[Series 6: PD · oblique · 4.0mm · 0.27mm/px · 3 of 14 slices shown (3 of 5)]
[im 1/14]
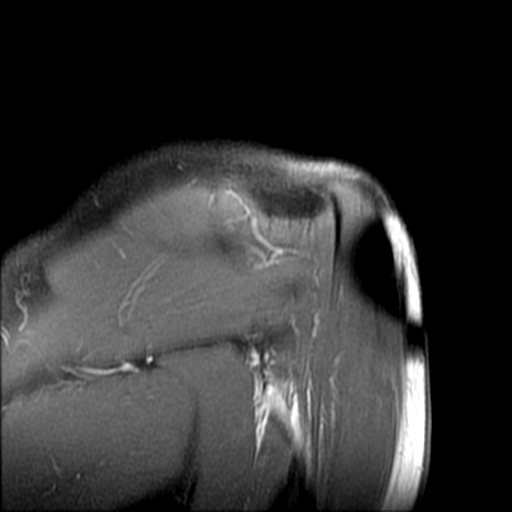
[im 7/14]
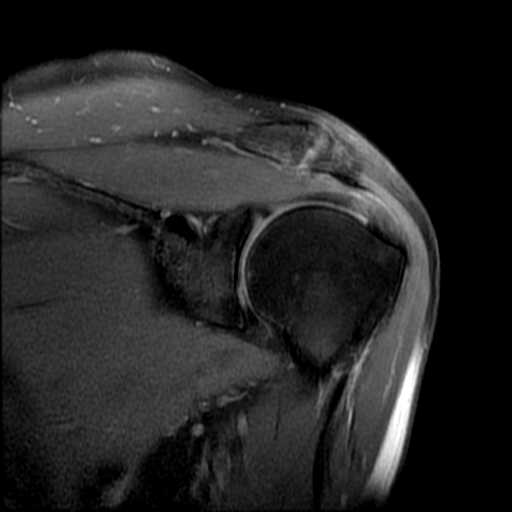
[im 14/14]
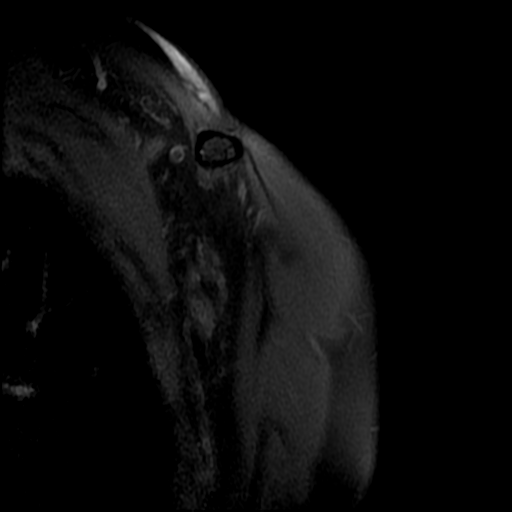

[Series 9: PD · oblique · 4.0mm · 0.27mm/px · 3 of 14 slices shown (4 of 5)]
[im 1/14]
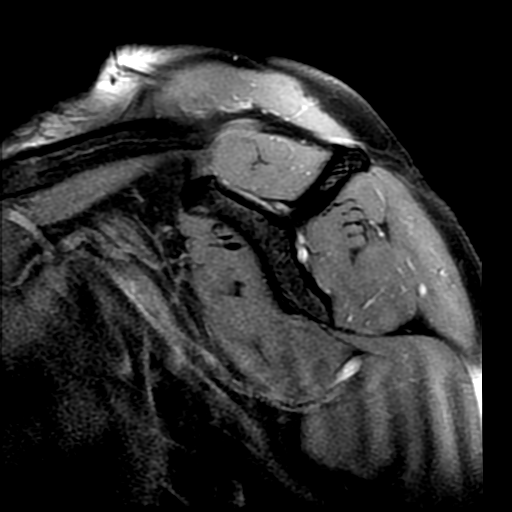
[im 7/14]
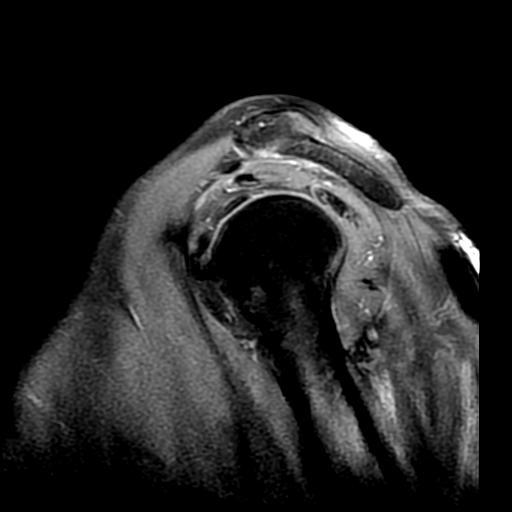
[im 14/14]
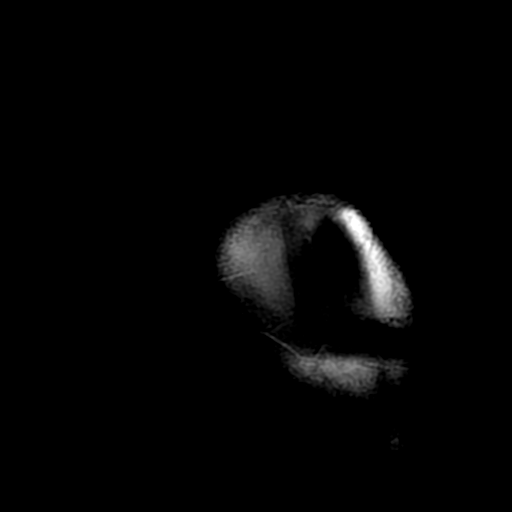

[Series 10: PD · axial · 4.0mm · 0.27mm/px · 1 of 16 slices shown (5 of 5)]
[im 1/16]
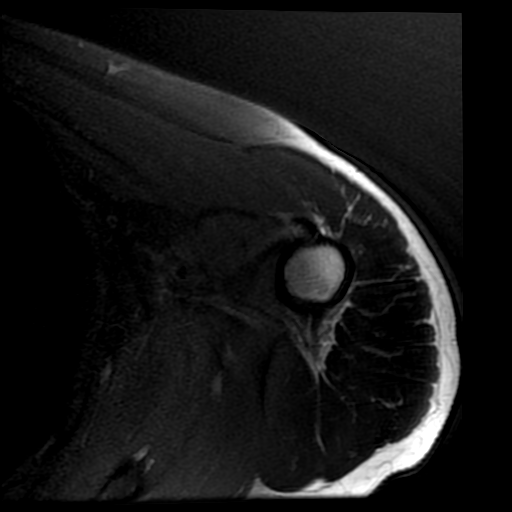

[19 of 40 positions shown; findings below may reference images not displayed]

FINDINGS: Moderate acromioclavicular joint degenerative changes with bony overgrowth and surrounding edema.  Lateral down-sloping acromion.  Mild curvature anterior acromion.  These findings contribute to narrowing of the passage way of the underlying rotator cuff musculature and tendons.
Prominent supraspinatus tendon and infraspinatus tendon tendonosis-type changes with areas of mild bursa surface and undersurface irregularity, with scattered intrasubstance partial tears.  No large full thickness retracted rotator cuff tear with a fluid-filled gap detected on the present exam.  It is possible that with this degree of tendinopathy with slight probing, the distal aspect of the diseased-appearing tendon may prove to be easily traversed.  
No evidence of labral tear.  The biceps tendon is located appropriately.
IMPRESSION: 1. Prominent tendinopathy supraspinatus tendon and infraspinatus tendon with regions of undersurface and bursa surface irregularity and scattered small partial intrasubstance tears without a large full thickness retracted rotator tear, as described above.
2. Moderate acromioclavicular joint degenerative changes.  Lateral down-sloping acromion and mild curvature anterior aspect of the acromion.

## 2006-07-08 IMAGING — RF DG UGI W/ SMALL BOWEL
2 series · 14 of 24 positions shown · non-contrast
Comparison: none

REASON FOR EXAM: ileus
COMMENTS:  LMP: Post Hysterectomy

[Series 1: run · 5 of 15 slices shown (1 of 2)]
[im 1/15]
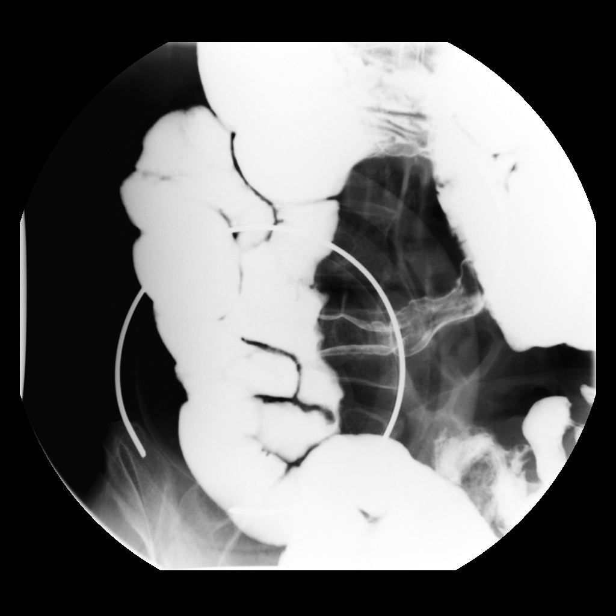
[im 4/15]
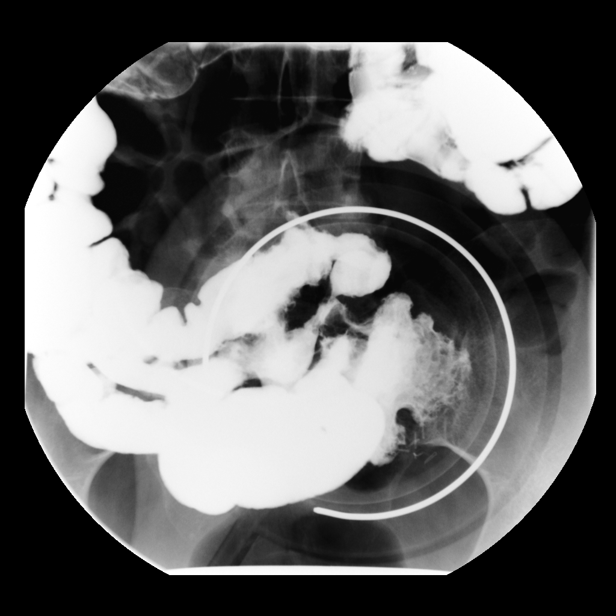
[im 8/15]
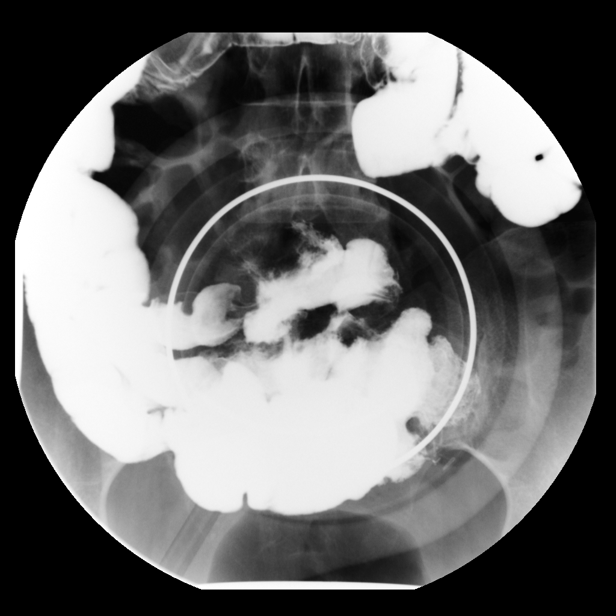
[im 11/15]
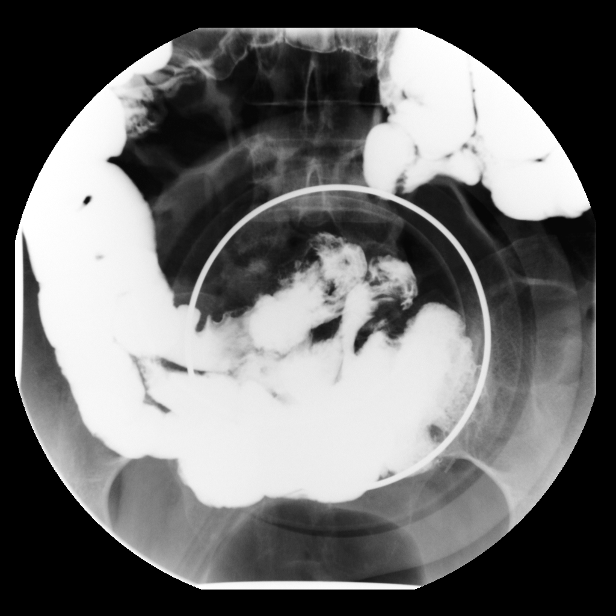
[im 13/15]
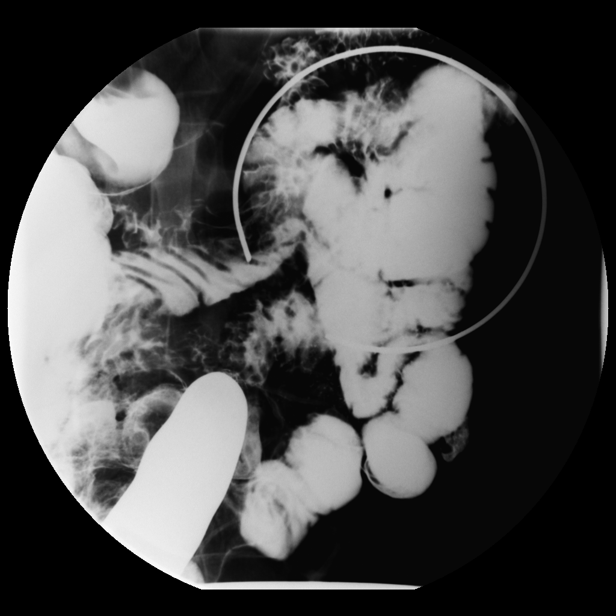

[Series 1: run · 9 of 27 slices shown (2 of 2)]
[im 1/27]
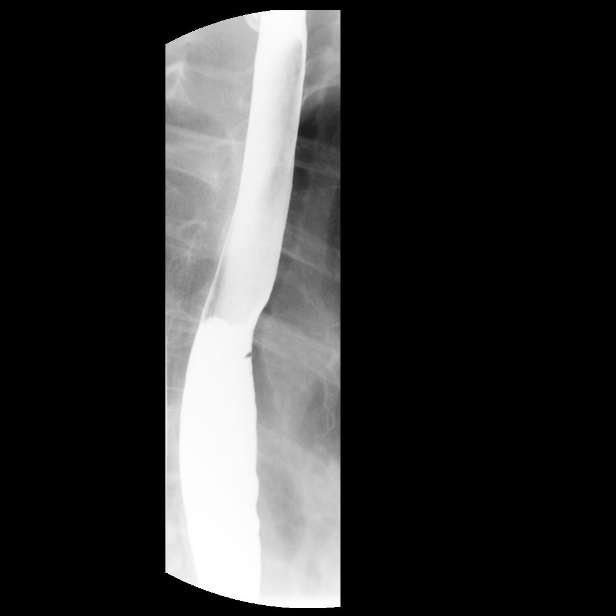
[im 4/27]
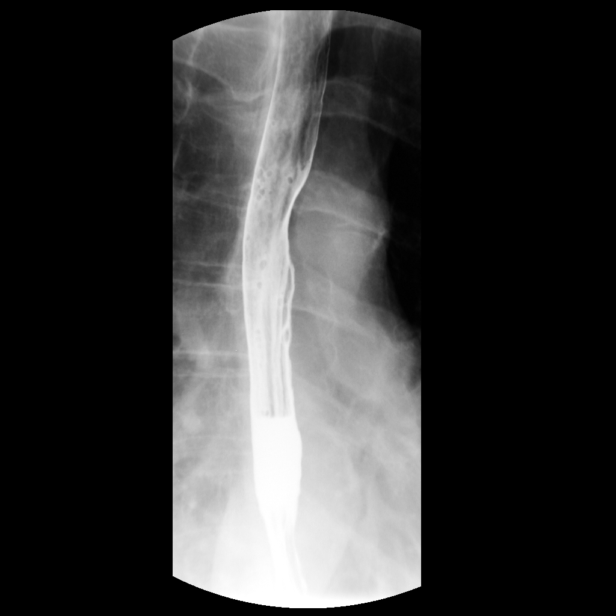
[im 6/27]
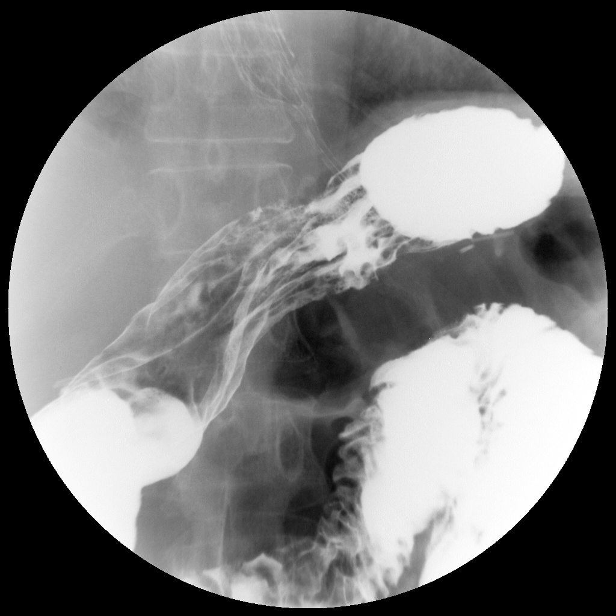
[im 10/27]
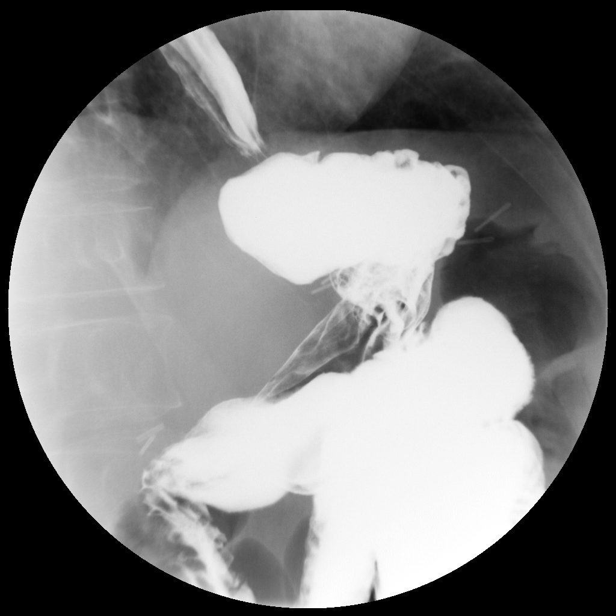
[im 14/27]
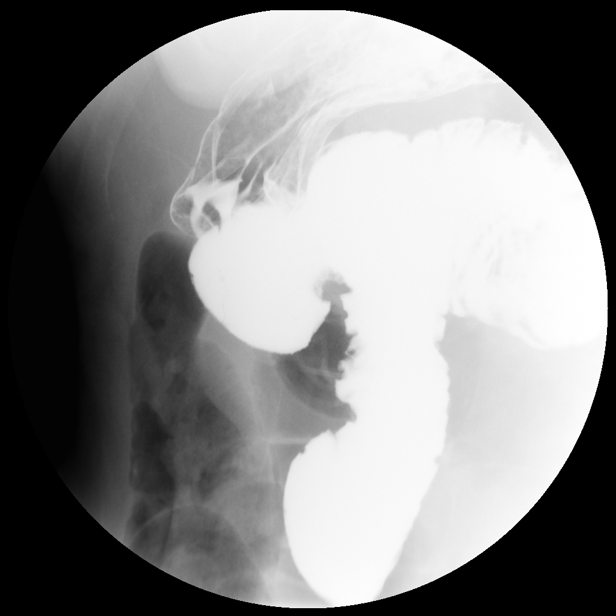
[im 17/27]
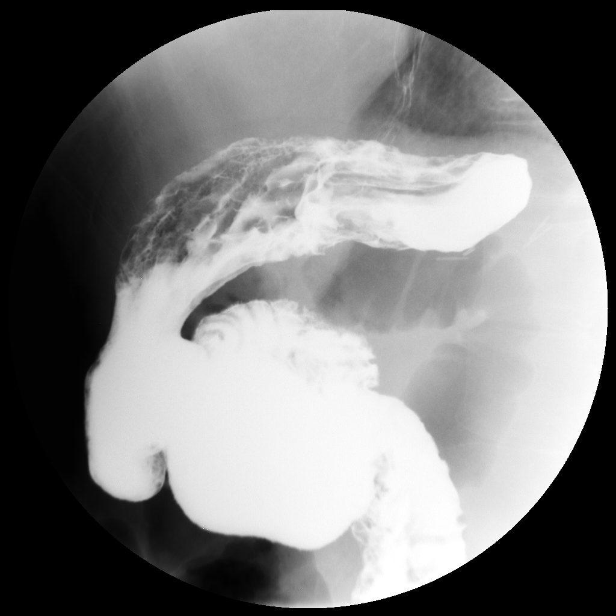
[im 19/27]
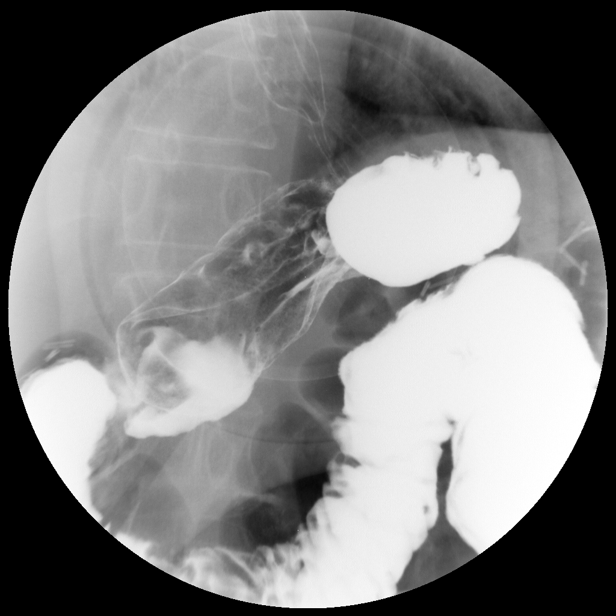
[im 23/27]
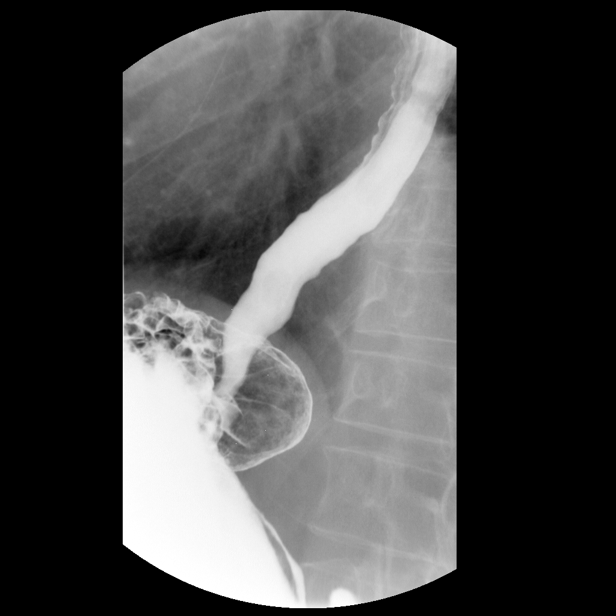
[im 27/27]
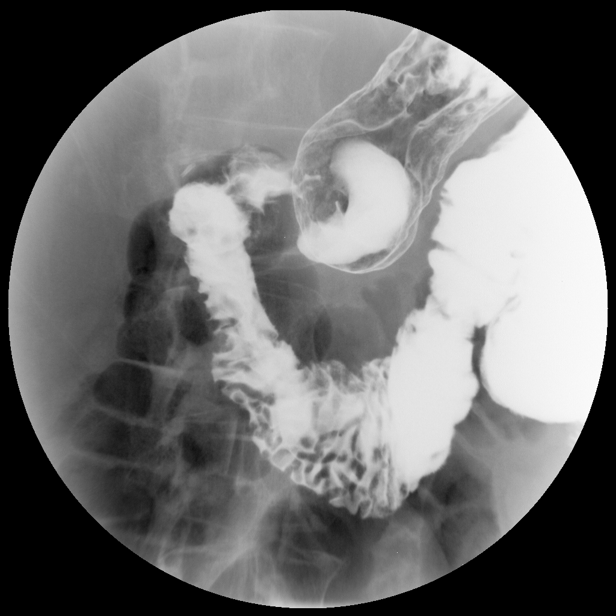

[14 of 24 positions shown; findings below may reference images not displayed]

PROCEDURE:     FL  - FL UPPER GI WITH SMALL BOWEL  - December 27, 2004  [DATE]

RESULT:     The patient was given oral barium and the barium column was
followed through the upper gastrointestinal tract as well as the small
bowel.

Evaluation of the esophagus demonstrates no evidence of filling defects,
strictural narrowing, nor extrinsic compression. The patient has a history
of a Nissen fundoplication.  There does appear to be appropriate relaxation
of the Tiger wrap.  No evidence of gastroesophageal reflux is identified.
Evaluation of the stomach demonstrates no evidence of filling defects,
strictural narrowing nor extrinsic compression. Evaluation of the duodenum
and duodenal bulb demonstrate no radiographic abnormalities.  Specifically,
there is no evidence of filling defects, strictural narrowing or extrinsic
compression. There is appropriate rotation of the duodenum and appropriate
placement of the ligament of Treitz.

Evaluation of the small bowel follow-through demonstrates no evidence of
extrinsic compression, strictural narrowing nor filling defects within the
small bowel. The terminal ileum is partially visualized and demonstrates no
radiographic abnormality. If there is specific clinical concern further
evaluation with direct visualization colonoscopy of the terminal ileum is
recommended if clinically warranted. Transit time to the terminal ileum is
approximately 1.5 hours.
IMPRESSION: 1)Unremarkable upper GI small bowel follow-through as described above.

## 2006-08-03 IMAGING — CT CT ANGIOGRAPHY ABDOMEN
2 of 6 series · 13 of 42 positions shown, 18 images · IV contrast (APPLIED)
Comparison: none

REASON FOR EXAM: Generalized abdominal pain.
COMMENTS:

[Series 5: inspace · axial · 0.63mm/px · z∈[-606,-265]mm · 10 of 504 slices shown, 15 images]
[im 39/504  soft-tissue]
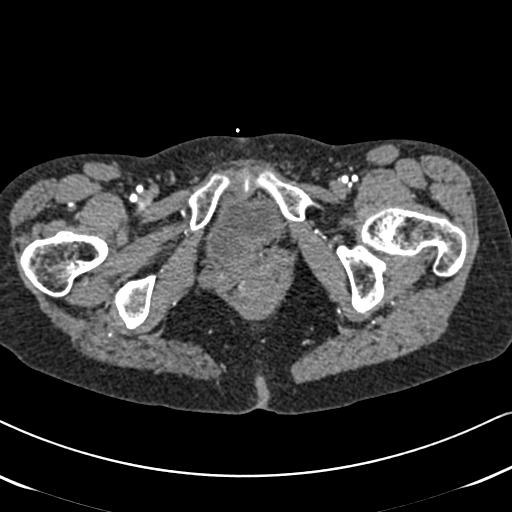
[im 39/504  bone]
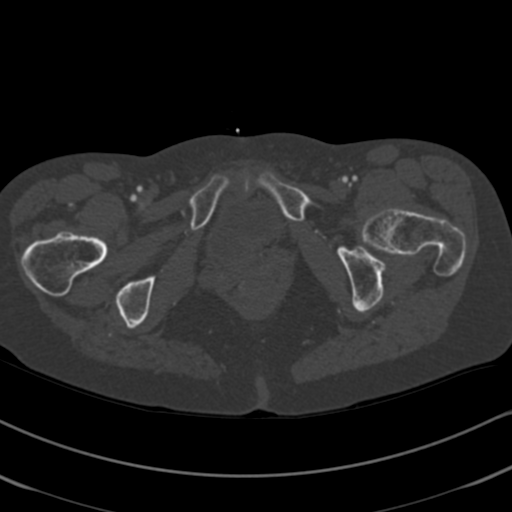
[im 117/504  soft-tissue]
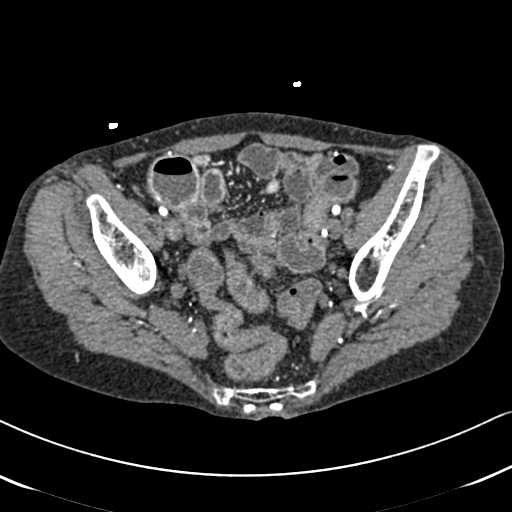
[im 155/504  soft-tissue]
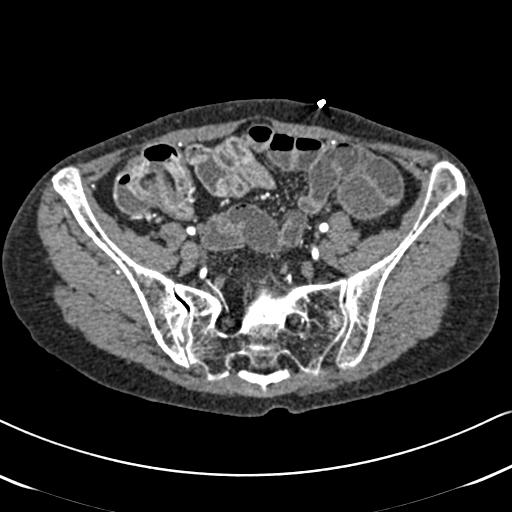
[im 194/504  soft-tissue]
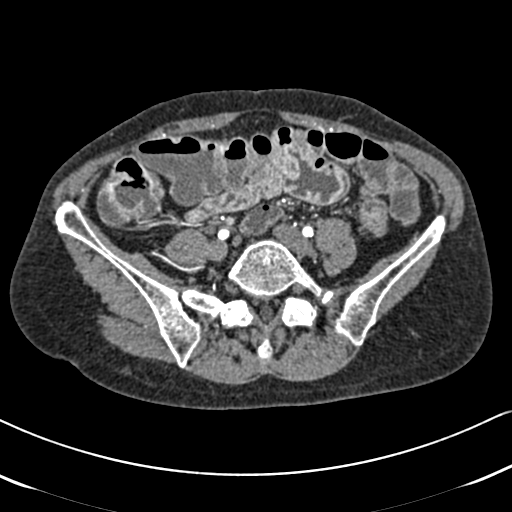
[im 271/504  soft-tissue]
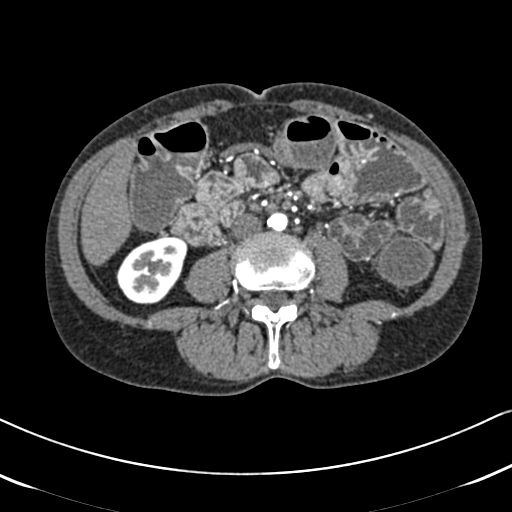
[im 310/504  soft-tissue]
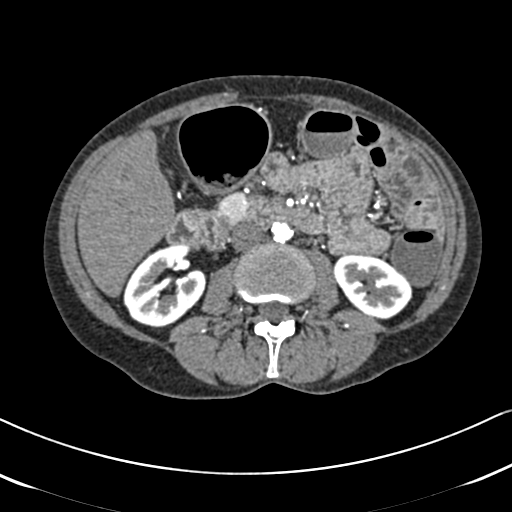
[im 349/504  soft-tissue]
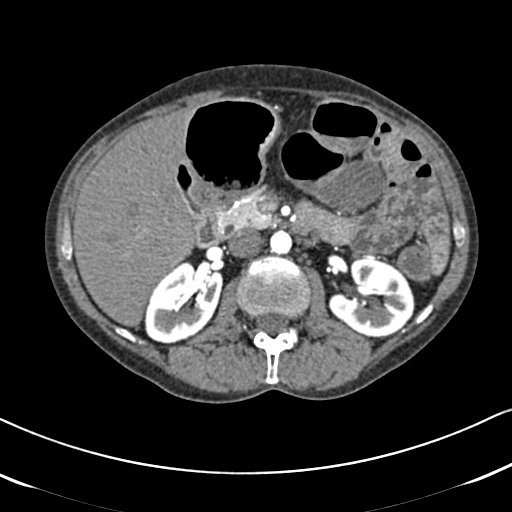
[im 349/504  lung]
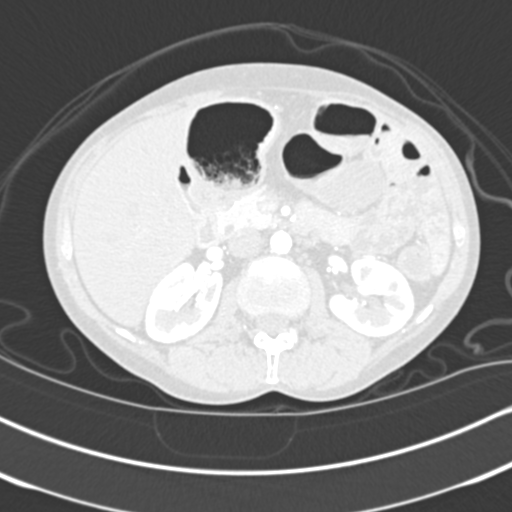
[im 387/504  lung]
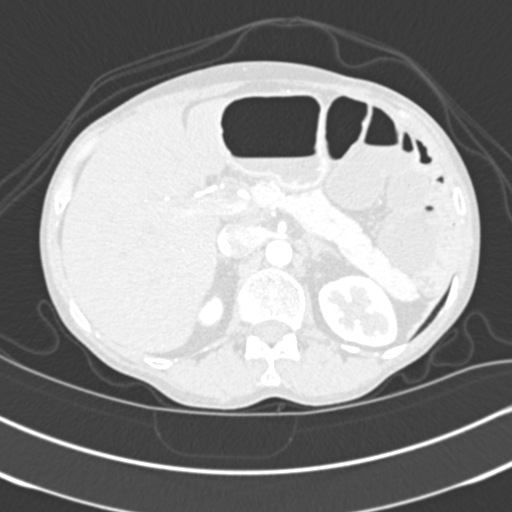
[im 426/504  soft-tissue]
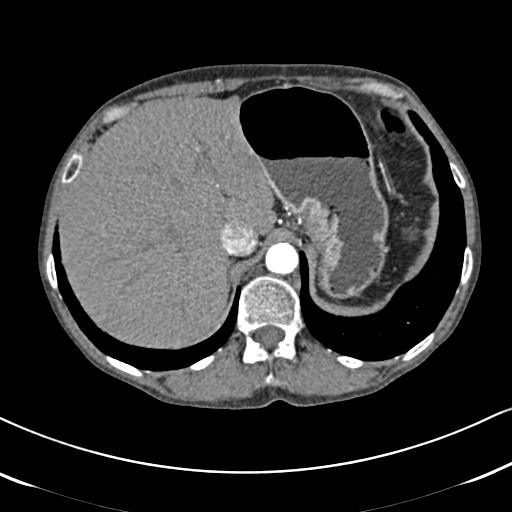
[im 426/504  lung]
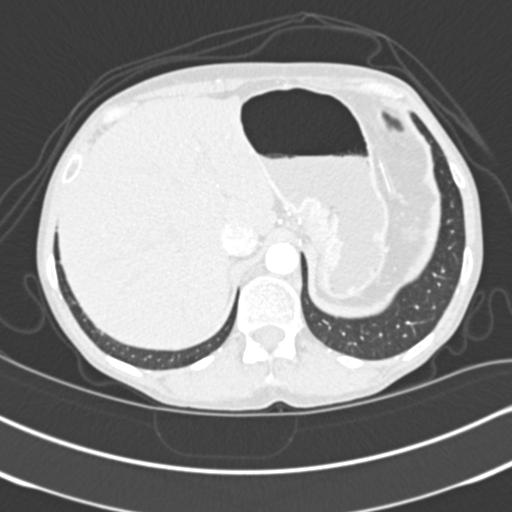
[im 465/504  soft-tissue]
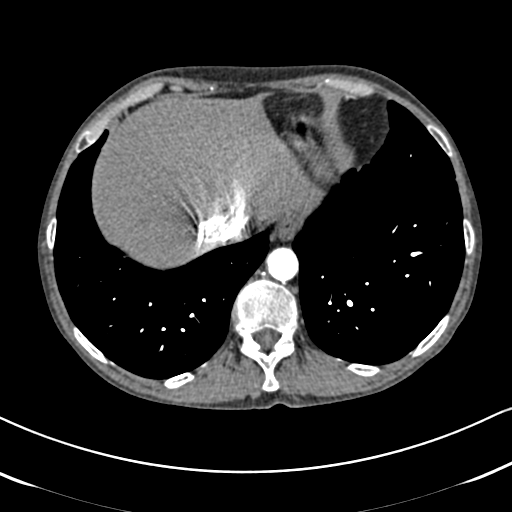
[im 465/504  lung]
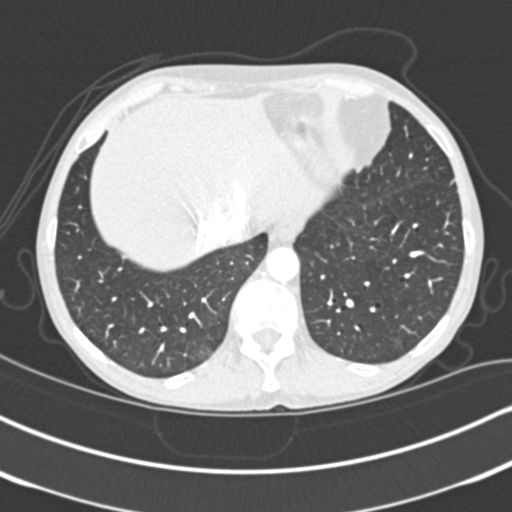
[im 465/504  bone]
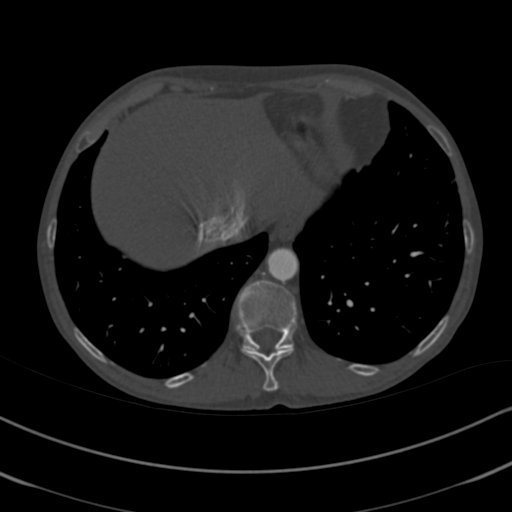

[Series 602: <mpr thick range> · coronal · 0.79mm/px · 3 of 86 slices shown]
[im 29/86  soft-tissue]
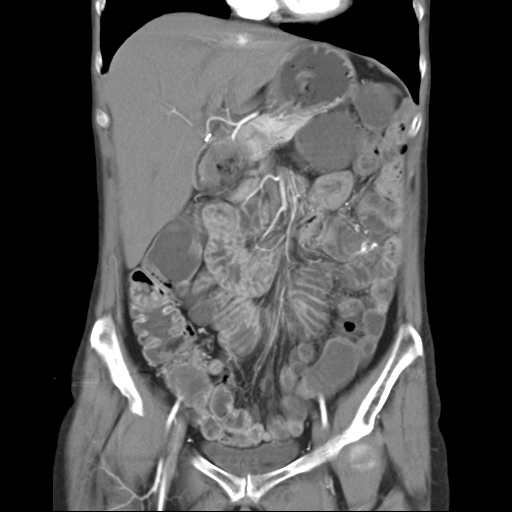
[im 38/86  soft-tissue]
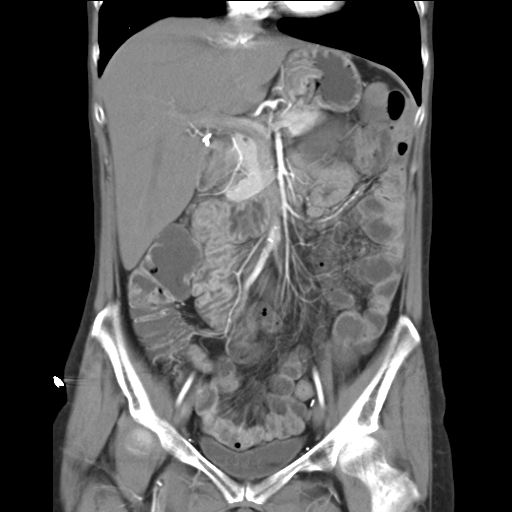
[im 48/86  soft-tissue]
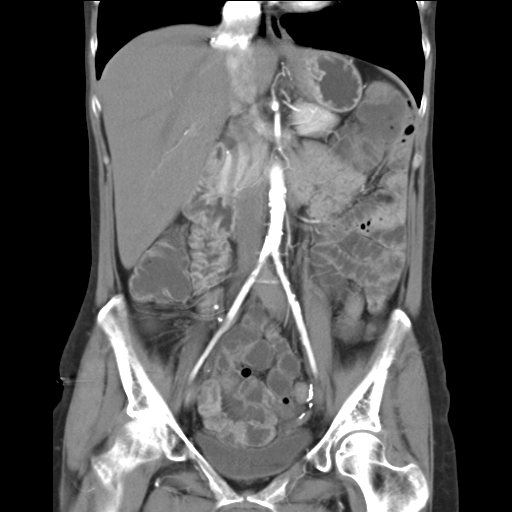

[13 of 42 positions shown; findings below may reference images not displayed]

PROCEDURE:     CT  - CT ANGIOGRAPHY ABDOMEN ONLY WWO  - January 22, 2005  [DATE]

RESULT:         5-mm helical cuts were performed through the abdomen first
without and then with 125 cc of Rsovue-XKG contrast.  The source images were
then reconfigured by the computer to render a 3D image of the abdominal
aorta and its branches.   The source images show a normal appearing liver
and I suspect the spleen has been removed. The stomach, pancreas, adrenals
and kidneys are unremarkable.  The gallbladder has also been surgically
removed. There is a marked abnormal appearance to the small bowel with
subtle enhancement and thickening.  After discussion with Dr. Athana,
however, the patient is known to have Crohn's disease, which is consistent
with the findings.  The bladder distends normally without evidence of
filling defect or wall thickening.  There is no free intraperitoneal fluid,
air or adenopathy or evidence of a hernia.  The lung bases are clear.

The reconstructed images show the abdominal aorta to taper normally without
evidence of aneurysm or stenosis.  Neither renal artery shows evidence of
hemodynamically significant stenosis at its origin or along its course.  The
SMA and celiac trunk also demonstrate normal origins.  The SMA is somewhat
diminished in caliber but there is a normal cascade of branches to the
intestines.
IMPRESSION: 1.     The aorta tapers normally without evidence of aneurysmal dilatation.
There is peripheral atherosclerotic calcifications along the course of the
aorta but there is no obvious hemodynamically significant stenosis in the
renal arteries, celiac trunk, SMA and there is normal bifurcation near the
common iliac arteries.
2.     The small bowel shows abnormal thickening, however, after
consultation with Dr. Athana the patient has Crohn's disease which explains
the unusual abnormal appearance to the proximal small bowel.  After
discussion with Dr. Athana and the appearance of the SMA, there is no
obvious intestinal ischemia or angina.  I suspect the thickening of the
small bowel loops is due to the patient's history of Crohn's disease.
3.     No free intraperitoneal fluid, air or adenopathy .
4.     Degenerative bony changes.

## 2006-09-09 ENCOUNTER — Ambulatory Visit: Payer: Self-pay

## 2006-09-18 ENCOUNTER — Inpatient Hospital Stay: Payer: Self-pay | Admitting: Gastroenterology

## 2006-09-30 ENCOUNTER — Ambulatory Visit: Payer: Self-pay | Admitting: Unknown Physician Specialty

## 2006-11-13 IMAGING — CR DG ABDOMEN 3V
1 series · 4 of 4 positions shown · non-contrast
Comparison: none

REASON FOR EXAM: Crohns disease with pain, n/.v. Eval for SBO
COMMENTS:

[Series 1: view not recorded · 0.17mm/px · 4 of 4 slices shown]
[im 1/4]
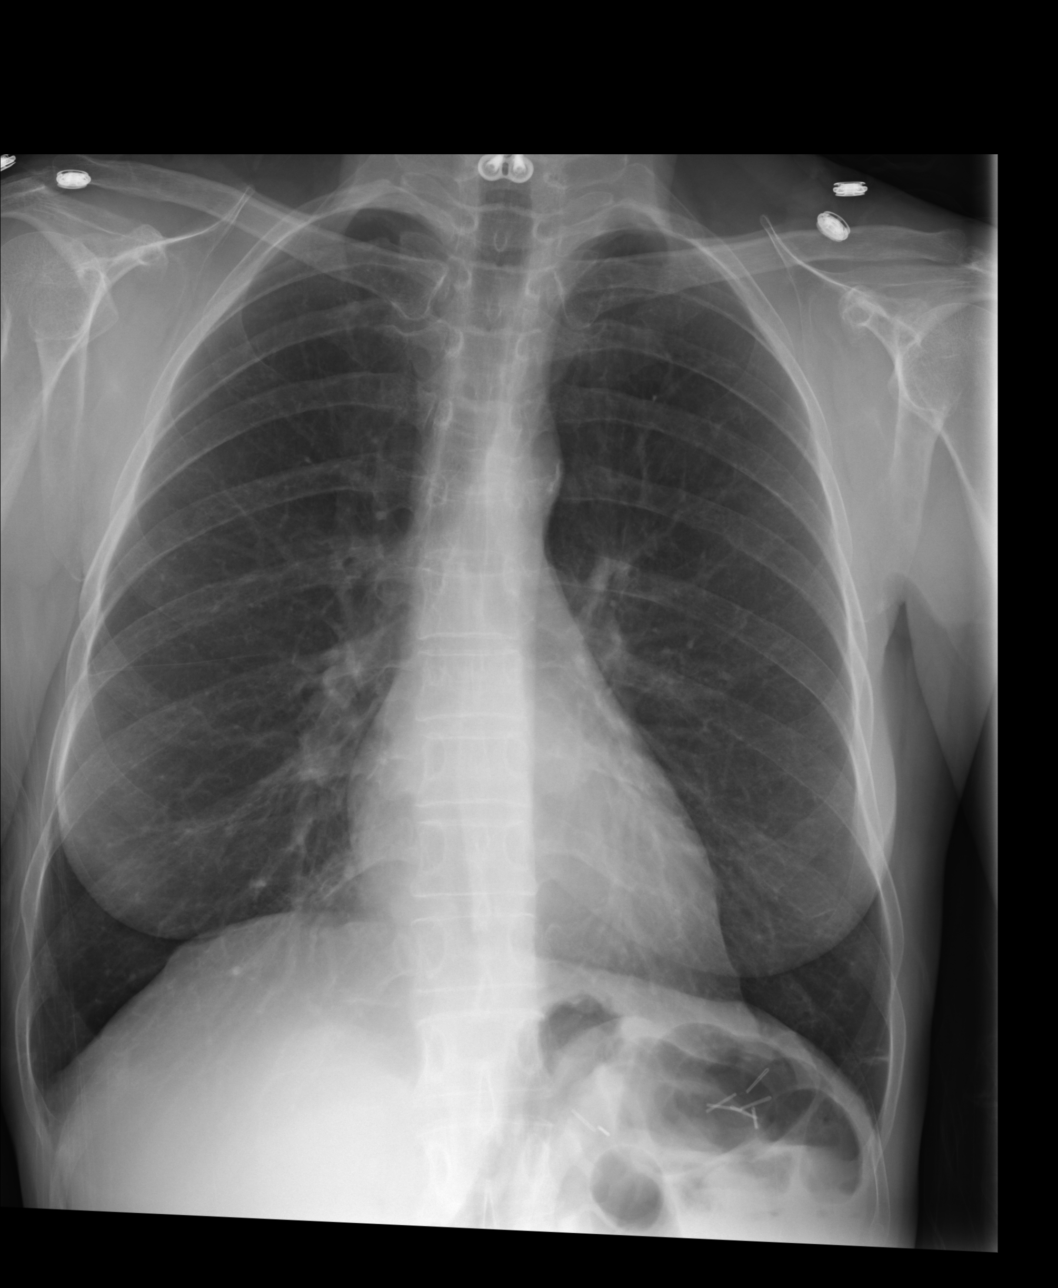
[im 2/4]
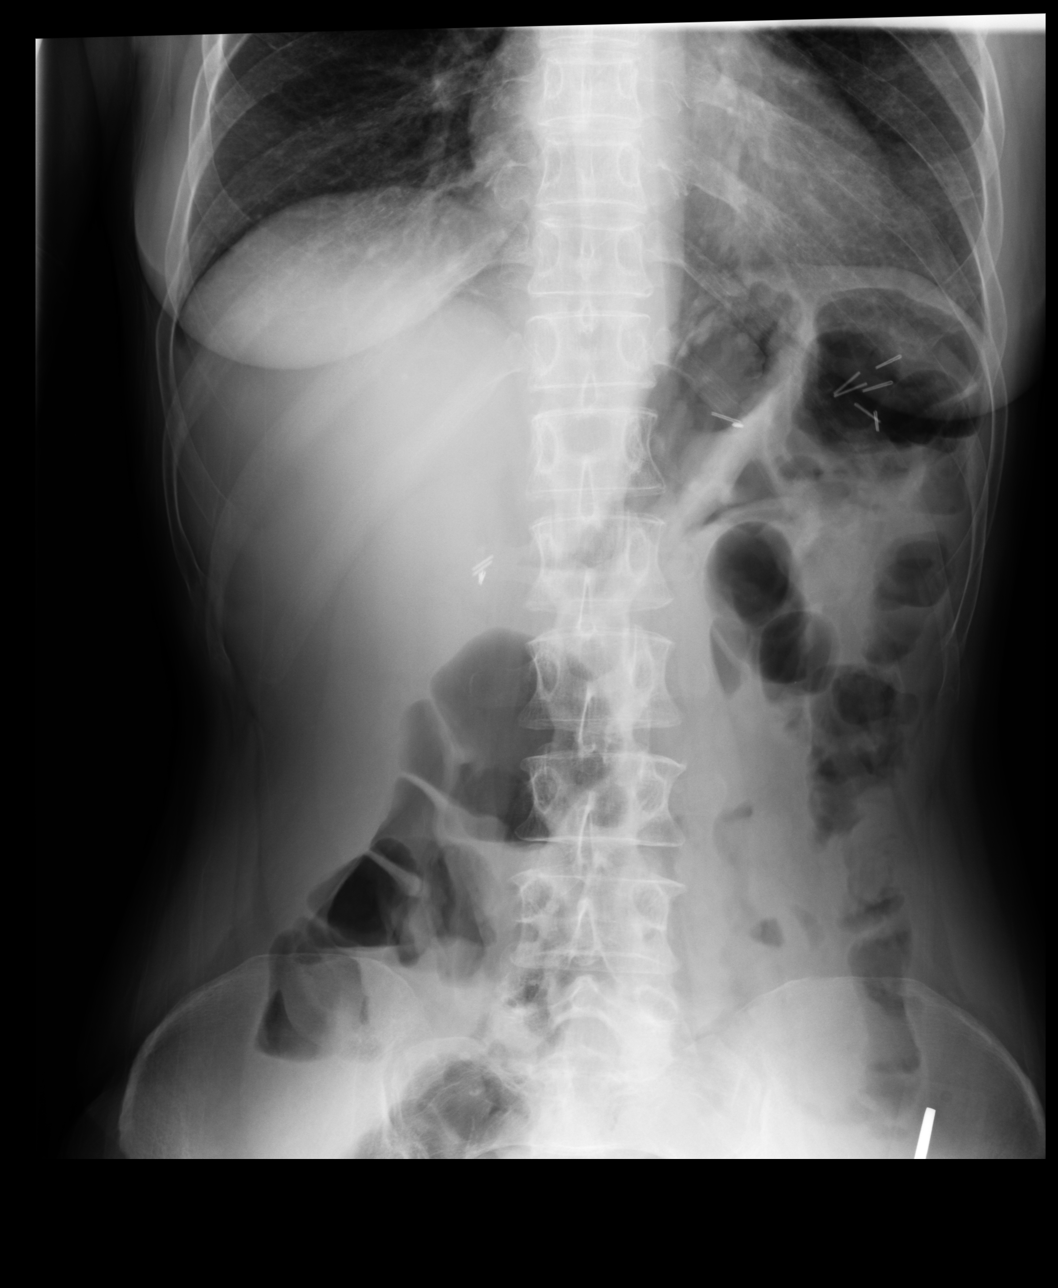
[im 3/4]
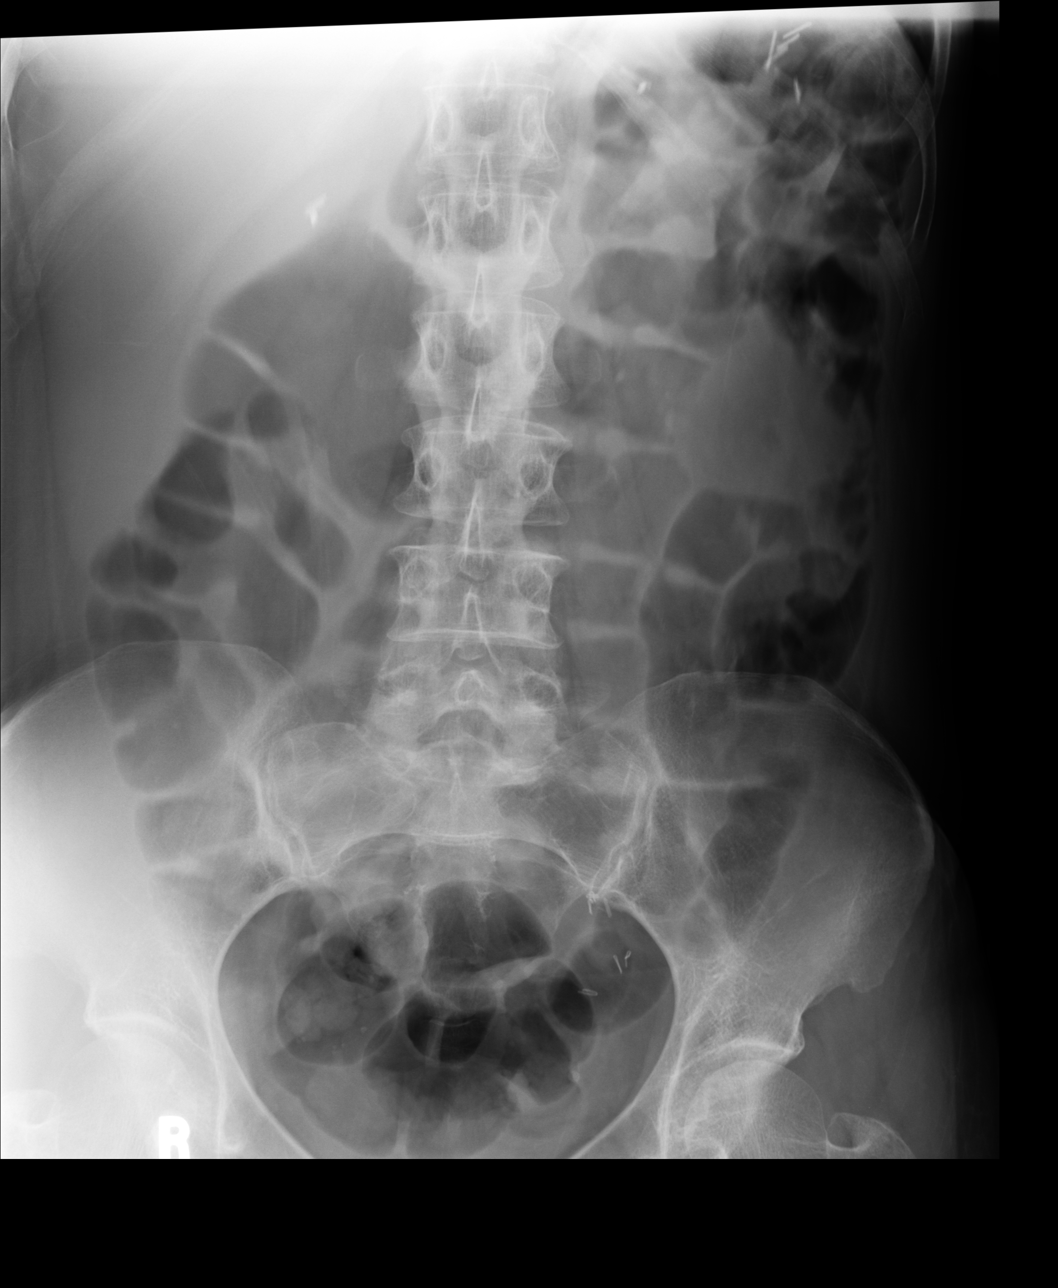
[im 4/4]
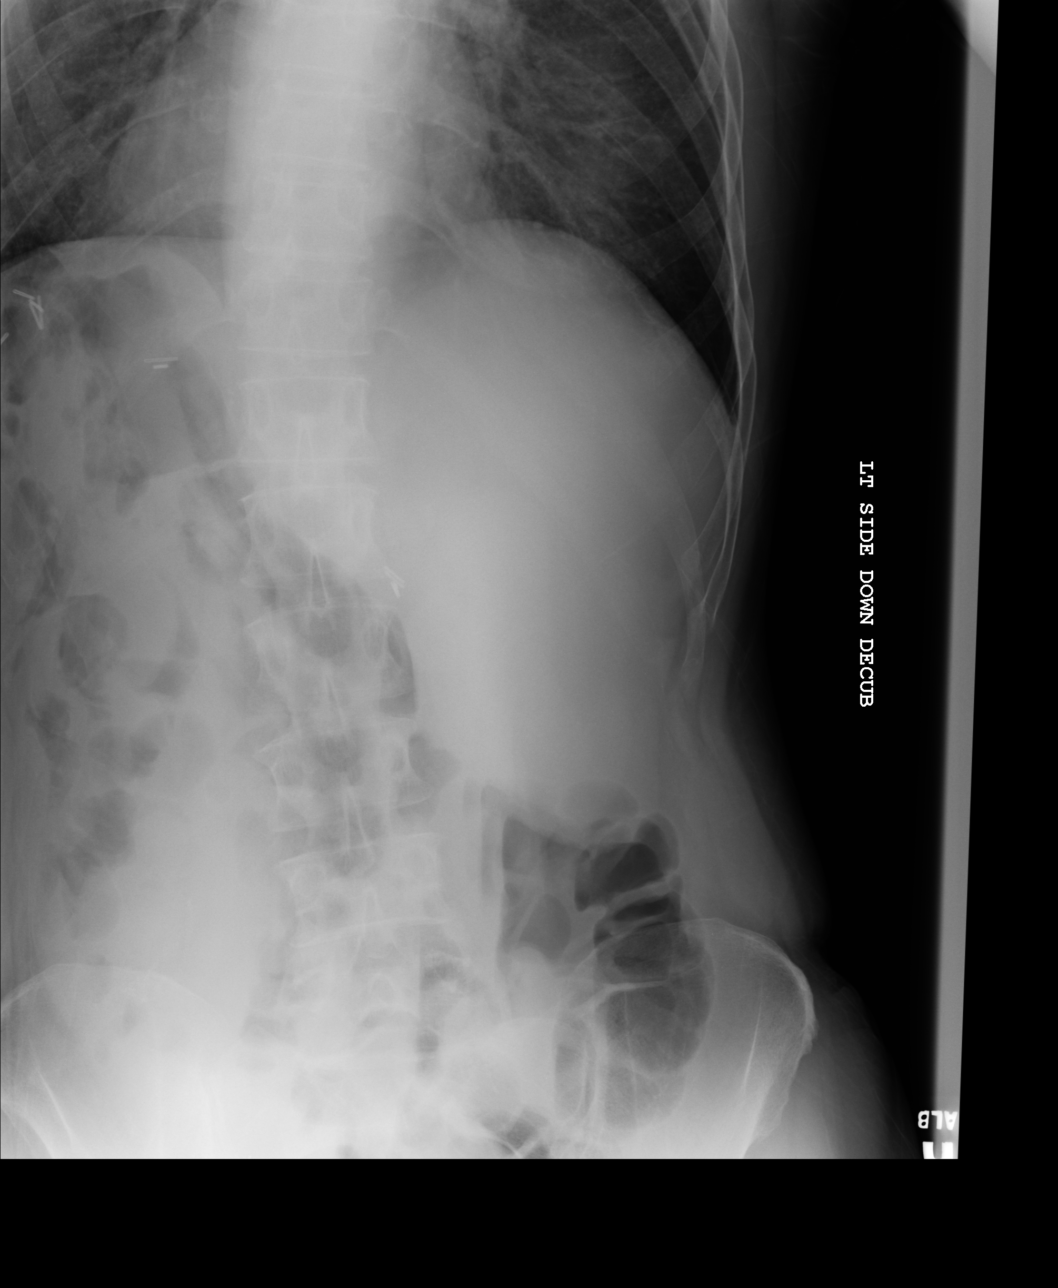

[4 of 4 positions shown; findings below may reference images not displayed]

PROCEDURE:     DXR - DXR ABDOMEN 3-WAY (INCL PA CXR)  - May 04, 2005  [DATE]

RESULT:       The soft tissue structures are unremarkable.  The gas pattern
is nonspecific.  Surgical clips are noted in the RIGHT upper quadrant as
well as over the LEFT upper quadrant.  Surgical clips are noted in the LEFT
lower quadrant.

PA chest reveals no acute cardiopulmonary disease.
IMPRESSION: 1.     No focal abnormalities identified.  Post surgical changes are present.
2.     PA chest demonstrates no acute cardiopulmonary disease.

## 2006-11-15 IMAGING — CT CT ABD-PELV W/ CM
1 of 2 series · 15 of 32 positions shown, 19 images · non-contrast
Comparison: none

REASON FOR EXAM: (1) Crohns disease with abd pain, elev LFT; (2) Crohns Dz
with abd pain
COMMENTS:

[Series 2: soft tissue · axial · 0.64mm/px · z∈[-633,-249]mm · 15 of 54 slices shown, 19 images]
[im 3/54  soft-tissue]
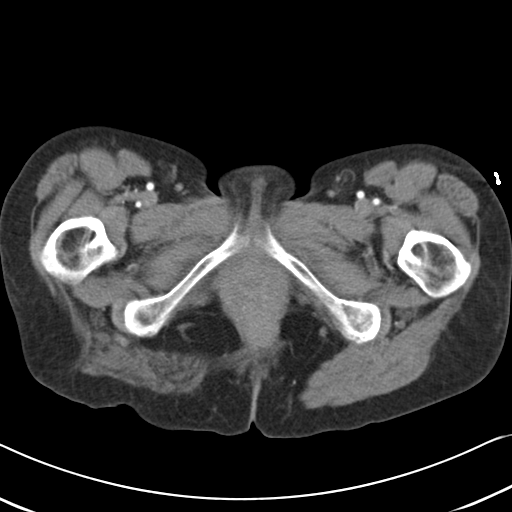
[im 3/54  bone]
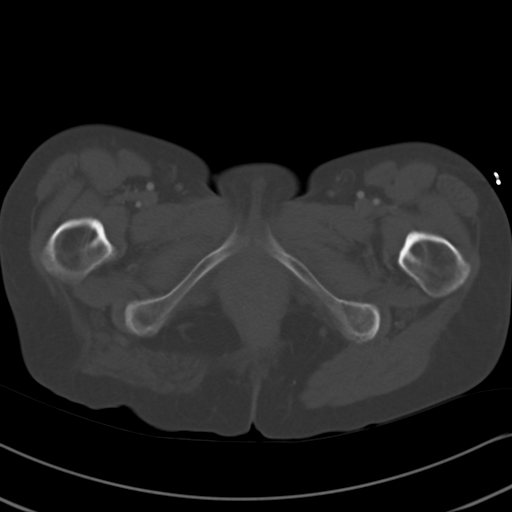
[im 7/54  soft-tissue]
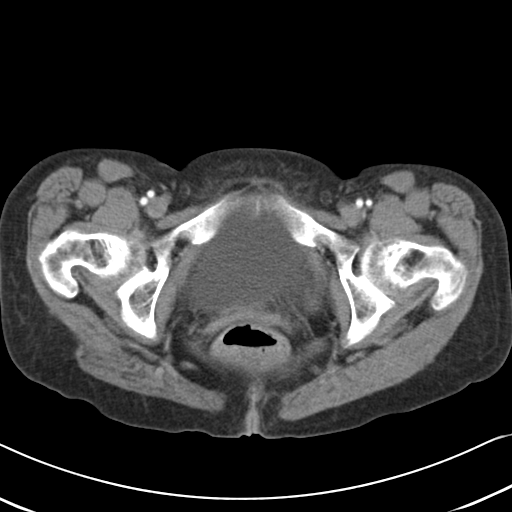
[im 12/54  soft-tissue]
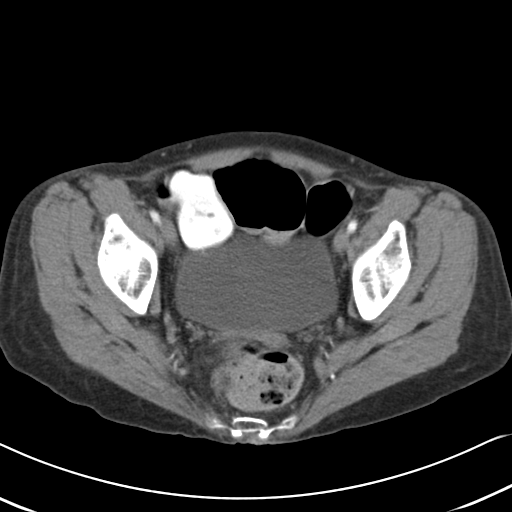
[im 16/54  soft-tissue]
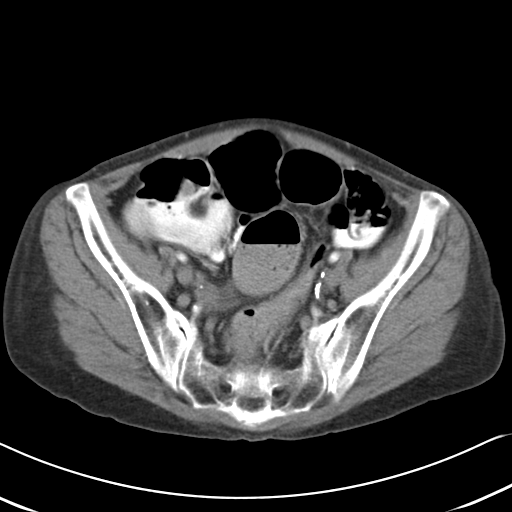
[im 18/54  soft-tissue]
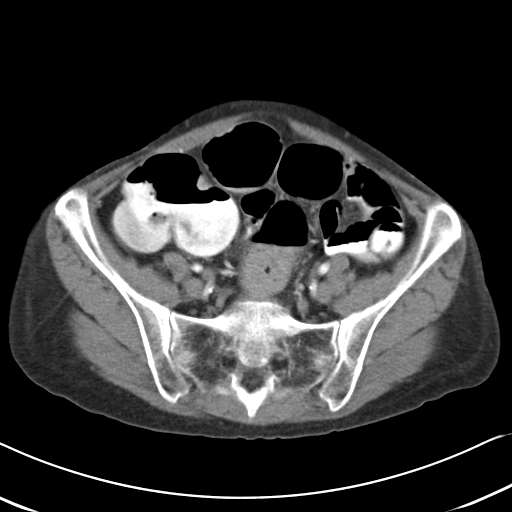
[im 23/54  soft-tissue]
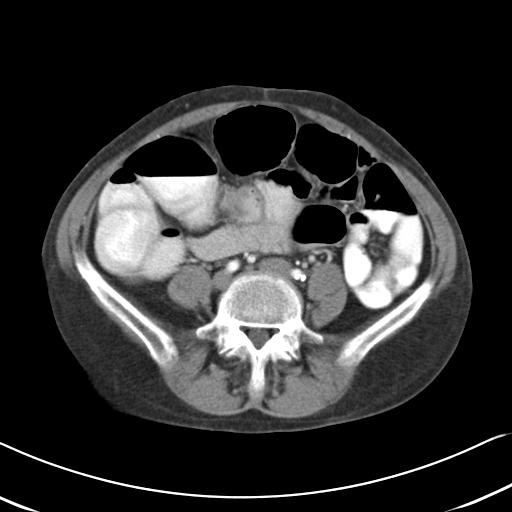
[im 27/54  soft-tissue]
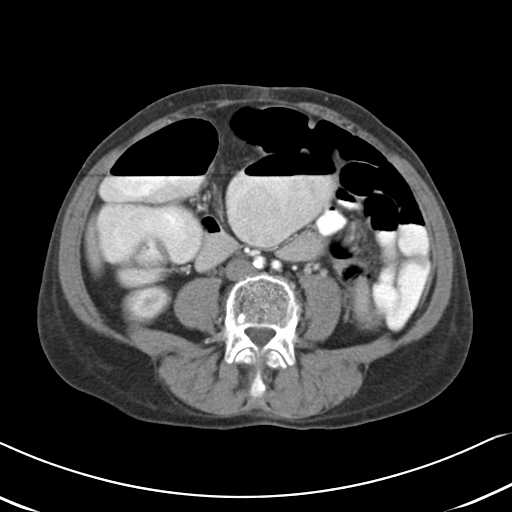
[im 31/54  soft-tissue]
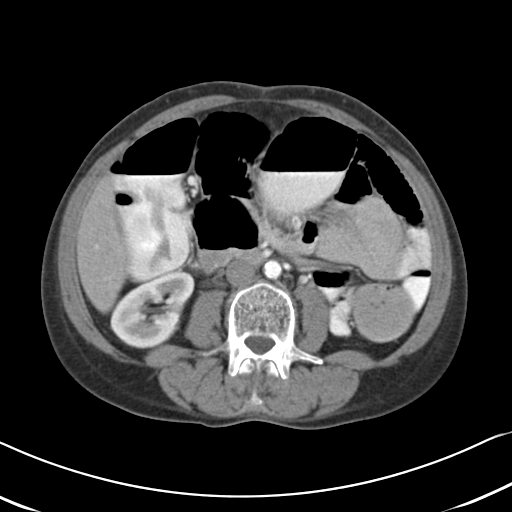
[im 36/54  soft-tissue]
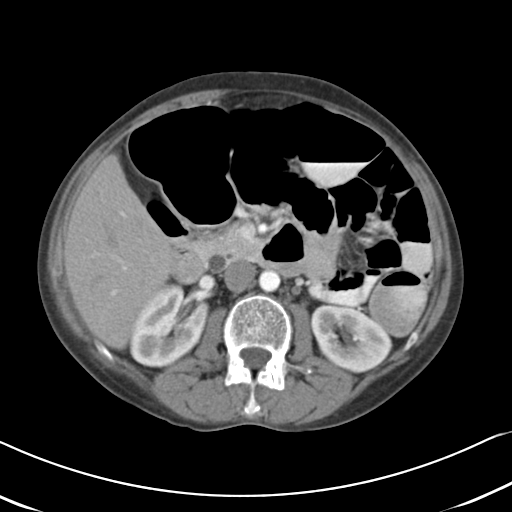
[im 36/54  bone]
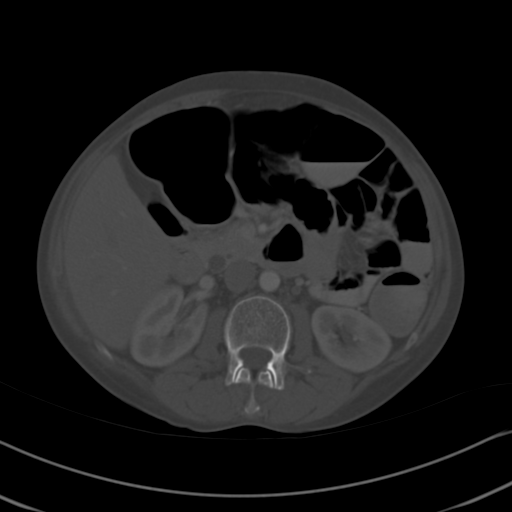
[im 38/54  soft-tissue]
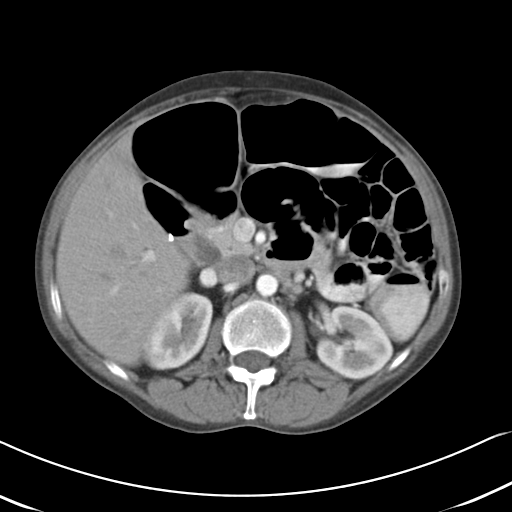
[im 42/54  soft-tissue]
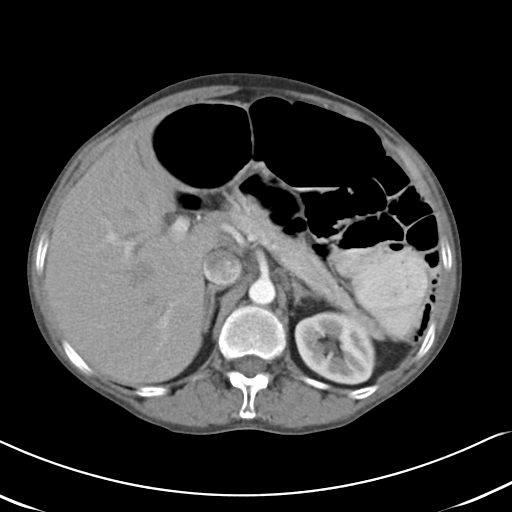
[im 45/54  lung]
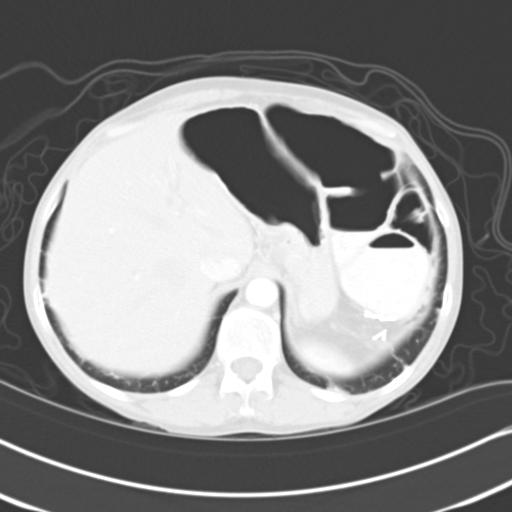
[im 47/54  soft-tissue]
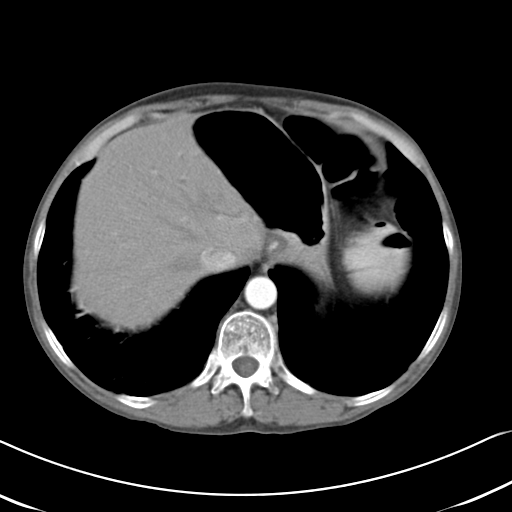
[im 47/54  lung]
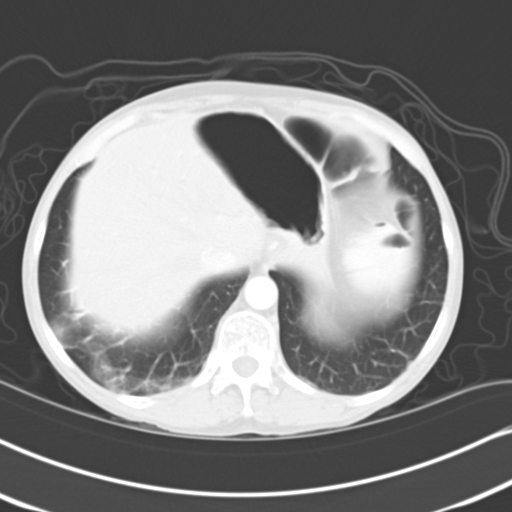
[im 49/54  lung]
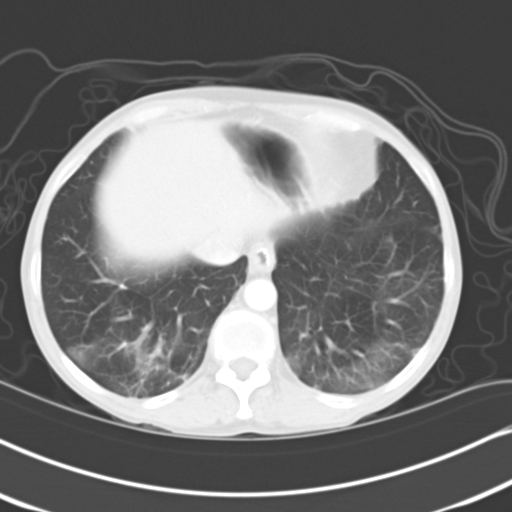
[im 51/54  soft-tissue]
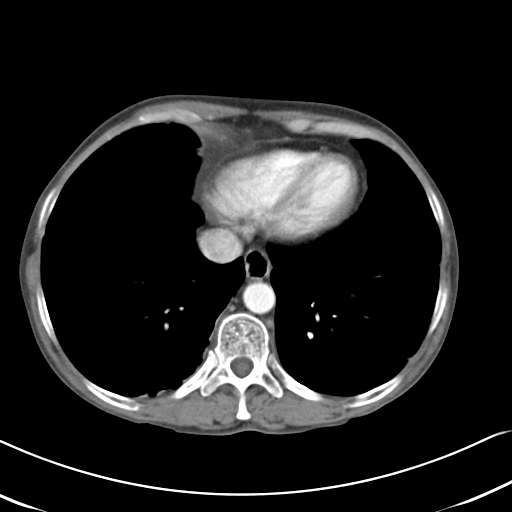
[im 51/54  lung]
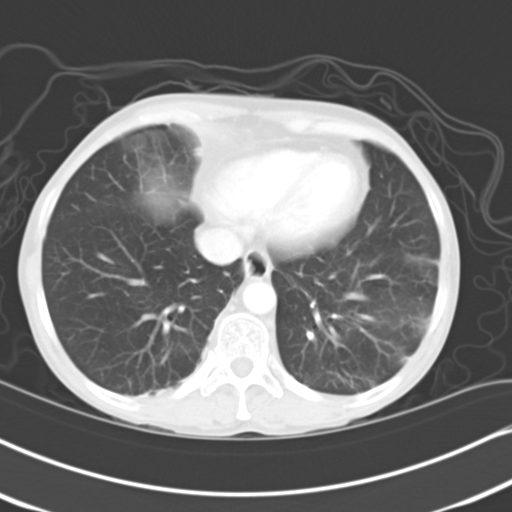

[15 of 32 positions shown; findings below may reference images not displayed]

PROCEDURE:     CT  - CT ABDOMEN / PELVIS  W  - May 06, 2005  [DATE]

RESULT:        8-mm helical cuts through the abdomen and pelvis were
performed with oral and 75 cc of Gsovue-Y1U contrast.  The lung bases show
some dependent atelectasis in both lung bases but are otherwise clear.  No
pleural or pericardial effusions are identified.  There is evidence of prior
abdominal surgery in the LEFT upper quadrant.  The liver enhances normally
without evidence of a suspicious mass lesion.   There is evidence of prior
cholecystectomy.   I suspect the patient has had a prior splenectomy.  The
pancreas, adrenals and kidneys are unremarkable.  Specifically, no evidence
of renal contour abnormality or low density lesion is identified.  There is
no suspicious thickening of small bowel loops to suspect a recurrence of
Crohn's disease but there are numerous mildly distended loops of small bowel
with air-fluid levels that suggest ileus.  No clear transition zone is
identified but I suspect that there is one that is likely in the distal
small intestine.  No free intraperitoneal fluid, air or adenopathy is noted.
 The bladder distends normally without evidence of filling defect or wall
thickening.  No perirectal mass or thickening is identified.  There is no
inguinal or ventral hernia. The bony structures show mild degenerative
change.
IMPRESSION: 1.     No suspicious abnormal thickening of small bowel loops are identified
to suspect the recurrence of Crohn's disease.
2.     There are distended loops of small bowel with multiple air fluid
levels consistent with ileus.
3.     No free intraperitoneal fluid, air or adenopathy.
4.     Degenerative bony changes without lytic or blastic bony lesion.
5.     Dependent atelectasis is identified in both lung bases.

## 2006-11-17 IMAGING — CR DG ABDOMEN 2V
1 series · 3 of 3 positions shown · non-contrast
Comparison: none

REASON FOR EXAM: obstruction
COMMENTS:

[Series 1: view not recorded · 0.17mm/px · 3 of 3 slices shown]
[im 1/3]
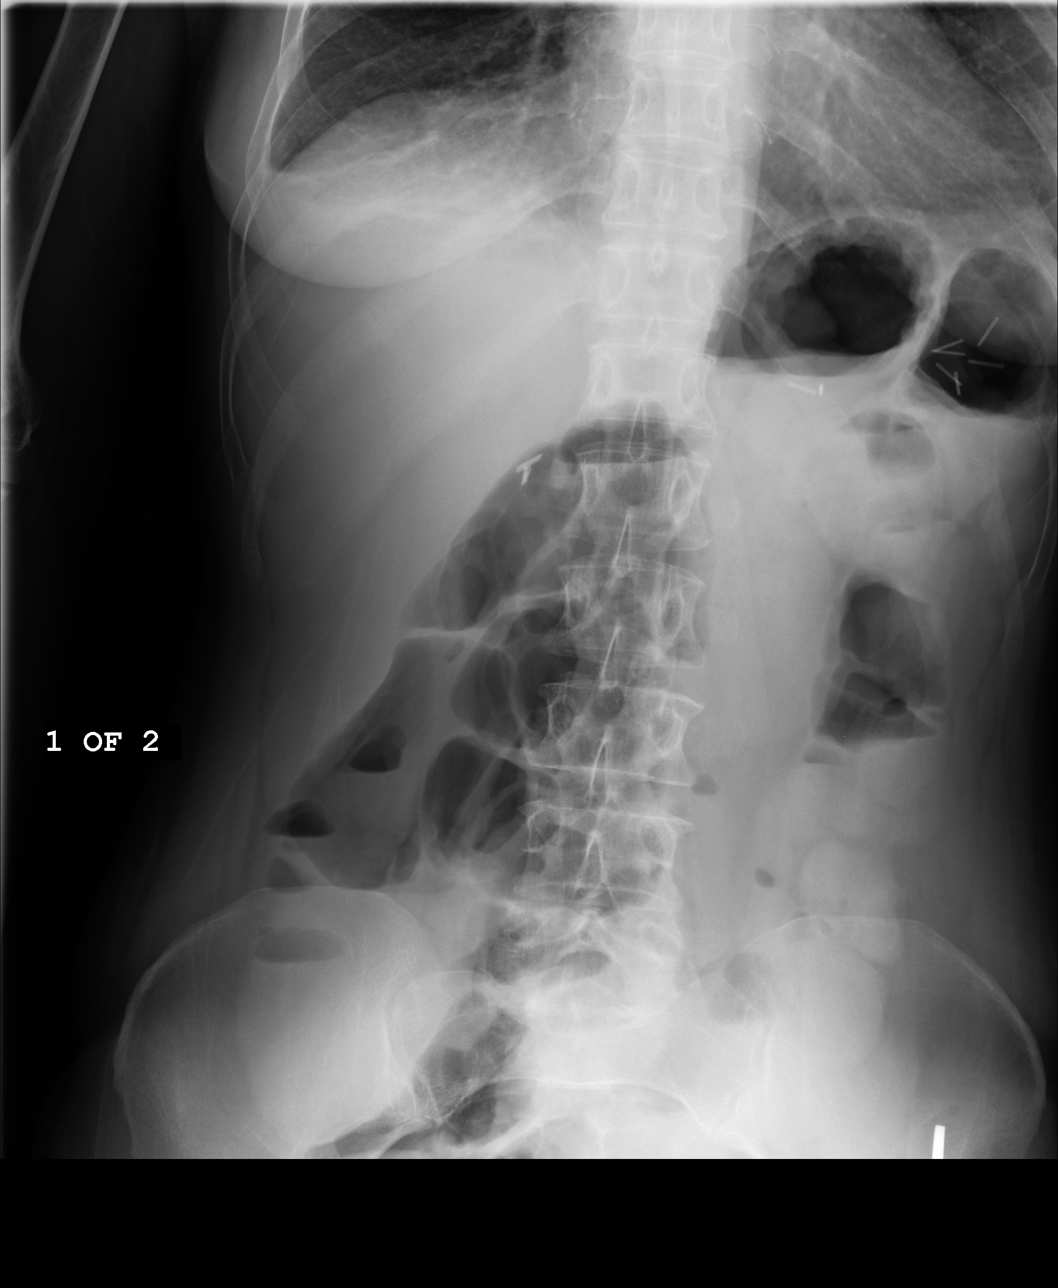
[im 2/3]
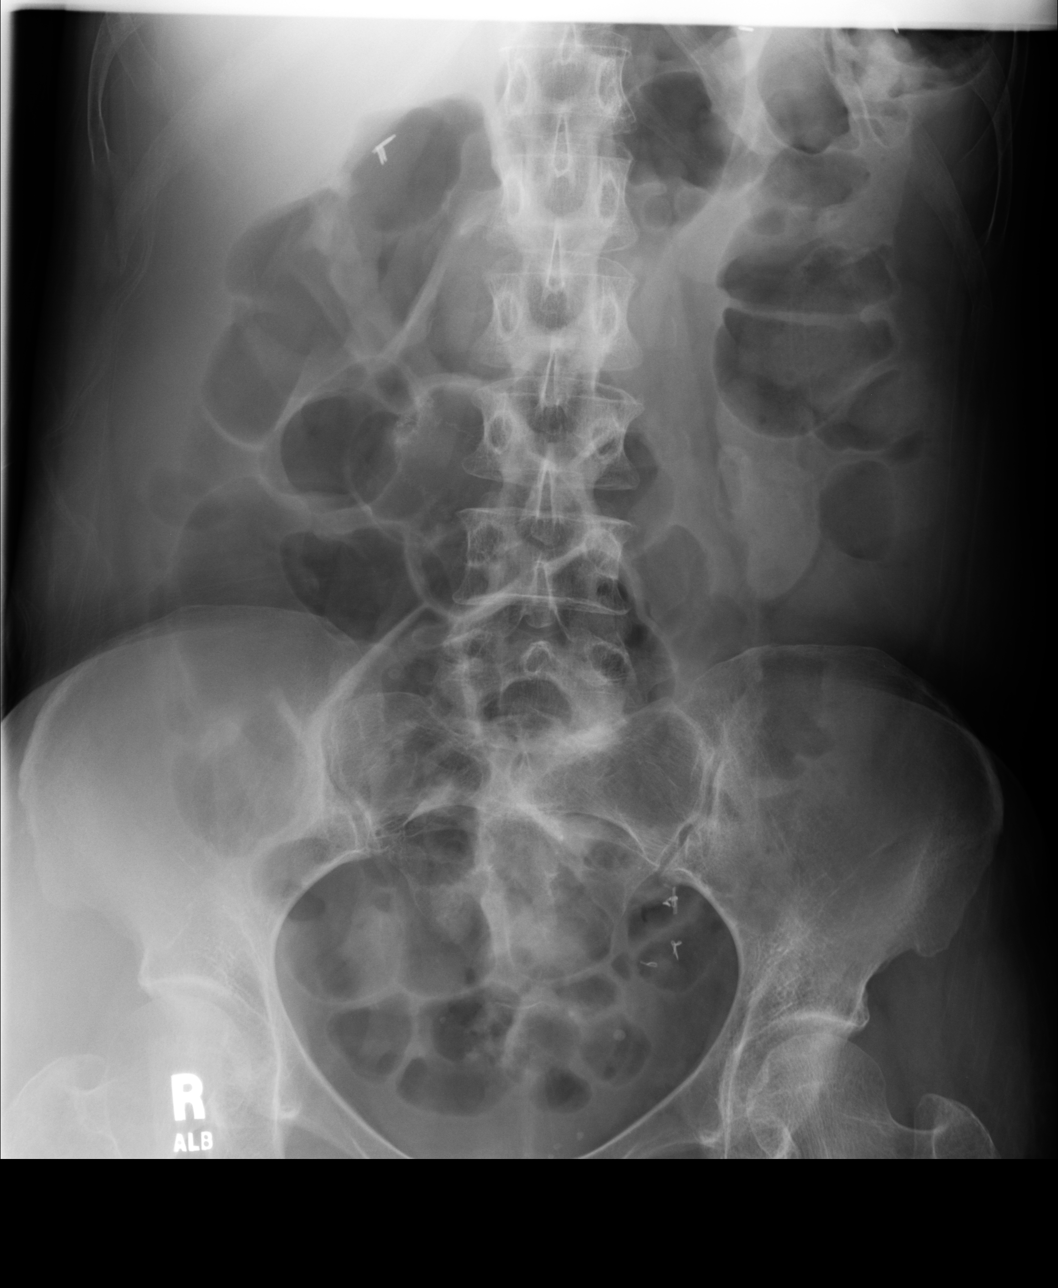
[im 3/3]
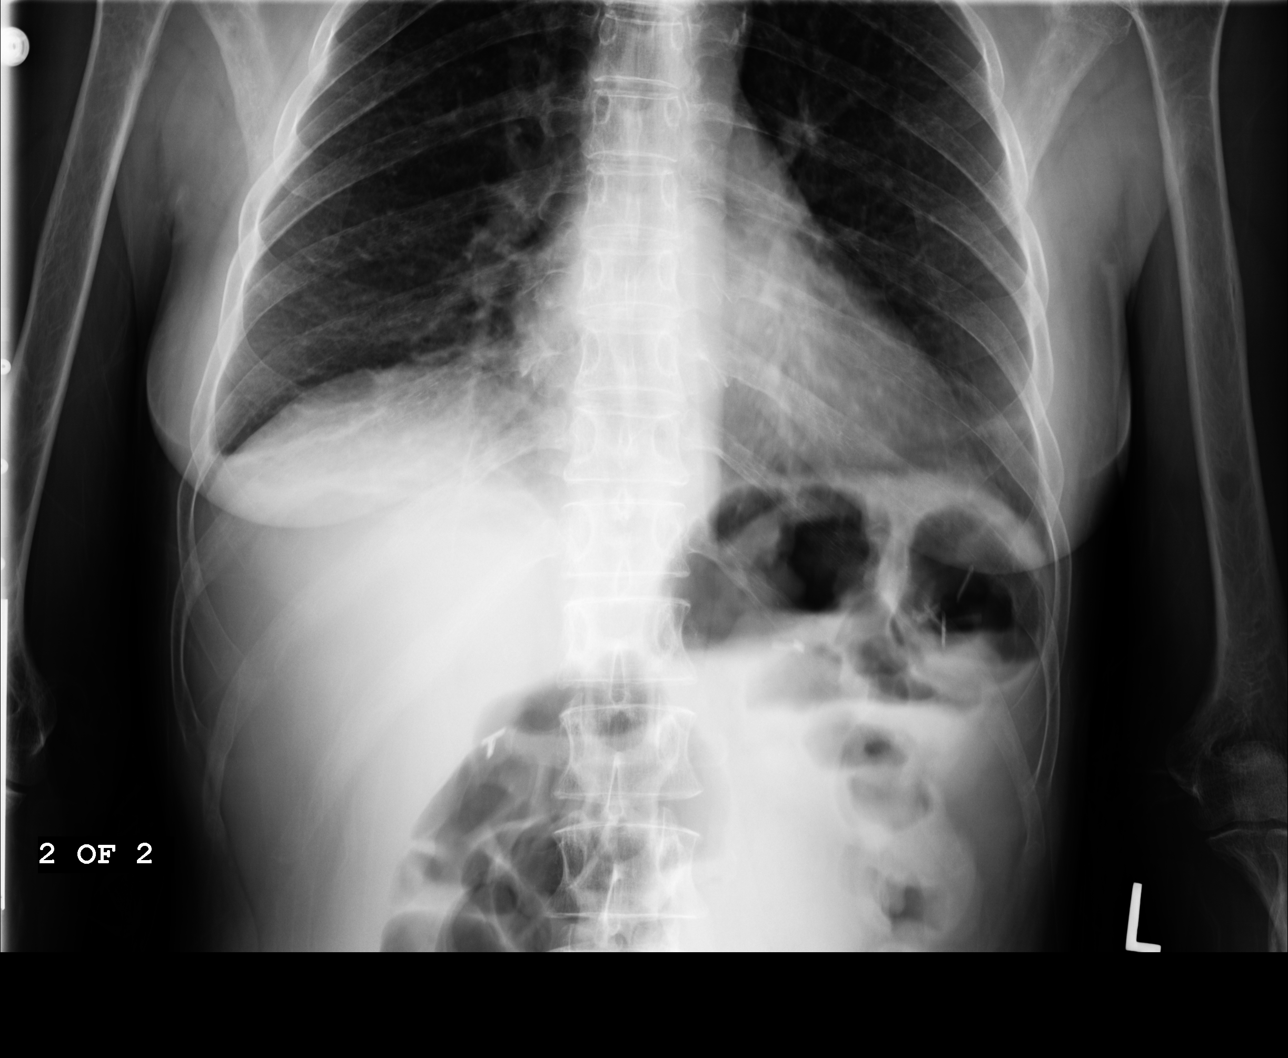

[3 of 3 positions shown; findings below may reference images not displayed]

PROCEDURE:     DXR - DXR ABDOMEN 2 V FLAT AND ERECT  - May 08, 2005  [DATE]

RESULT:     Flat and erect views of the abdomen are compared to a prior exam
of 05-04-05.  There persists mild dilatation of multiple loops of bowel
including large and small bowel. Gas is visualized in both the large and
small intestine. There are a few scattered nonspecific fluid levels in loops
of small bowel.  The findings remain nonspecific and are of the type
frequently seen on the basis of ileus from gastroenteritis, enteritis or
intraabdominal inflammation. Partial small bowel obstruction cannot be
totally excluded, but is considered unlikely in view of the amount of colon
gas present.
IMPRESSION: 1)There persists increased gas in loops of large and small bowel, most
compatible with an ileus pattern although partial small bowel obstruction
still cannot be totally excluded from the differential and continued
follow-up is recommended.

2)Post surgical clips are again noted in the upper abdomen bilaterally and
in the LEFT lower quadrant.

## 2006-12-20 ENCOUNTER — Inpatient Hospital Stay: Payer: Self-pay | Admitting: Internal Medicine

## 2007-01-05 ENCOUNTER — Ambulatory Visit: Payer: Self-pay | Admitting: Unknown Physician Specialty

## 2007-01-10 DIAGNOSIS — I82401 Acute embolism and thrombosis of unspecified deep veins of right lower extremity: Secondary | ICD-10-CM

## 2007-01-10 DIAGNOSIS — Z86718 Personal history of other venous thrombosis and embolism: Secondary | ICD-10-CM

## 2007-01-10 HISTORY — DX: Acute embolism and thrombosis of unspecified deep veins of right lower extremity: I82.401

## 2007-01-10 HISTORY — DX: Personal history of other venous thrombosis and embolism: Z86.718

## 2007-01-12 ENCOUNTER — Other Ambulatory Visit: Payer: Self-pay

## 2007-01-12 ENCOUNTER — Ambulatory Visit: Payer: Self-pay | Admitting: Surgery

## 2007-01-19 ENCOUNTER — Inpatient Hospital Stay: Payer: Self-pay | Admitting: Surgery

## 2007-01-29 ENCOUNTER — Inpatient Hospital Stay: Payer: Self-pay | Admitting: Surgery

## 2007-01-30 HISTORY — PX: LAPAROTOMY: SHX154

## 2007-02-12 ENCOUNTER — Ambulatory Visit: Payer: Self-pay | Admitting: Surgery

## 2007-02-16 IMAGING — MR MR CHOLANGIOGRAM
4 of 5 series · 25 of 48 positions shown · non-contrast
Comparison: none

REASON FOR EXAM: Crohn's disease, nausea and vomiting
COMMENTS:

[Series 2: t2_axial_trufi bh · axial · 4.0mm · 0.78mm/px · z∈[-116,+200]mm · 9 of 41 slices shown]
[im 1/41]
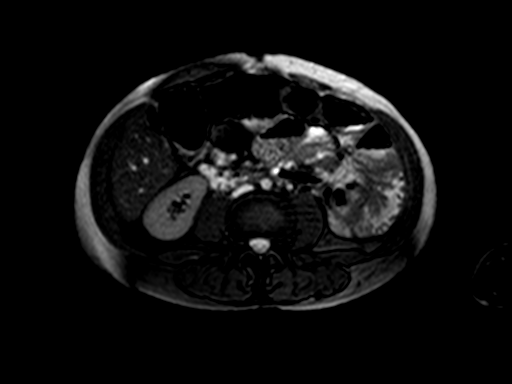
[im 7/41]
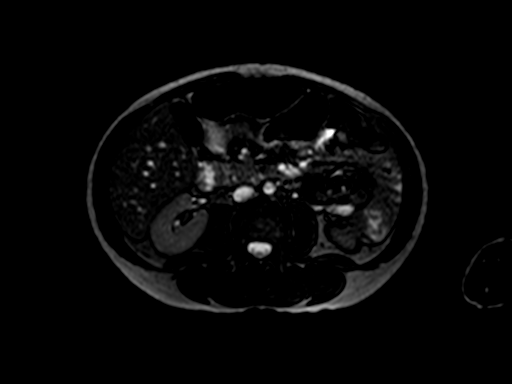
[im 14/41]
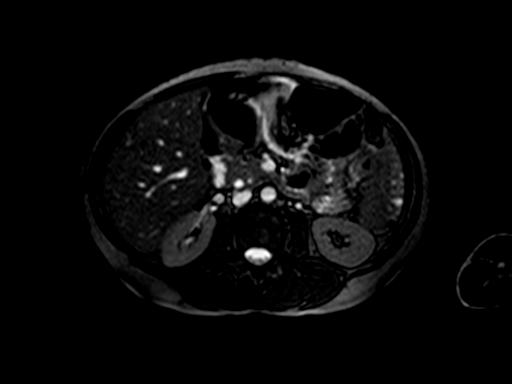
[im 17/41]
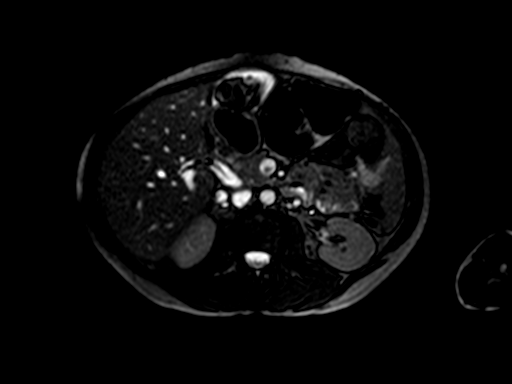
[im 21/41]
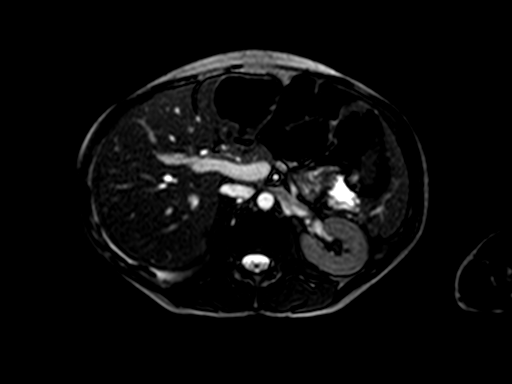
[im 24/41]
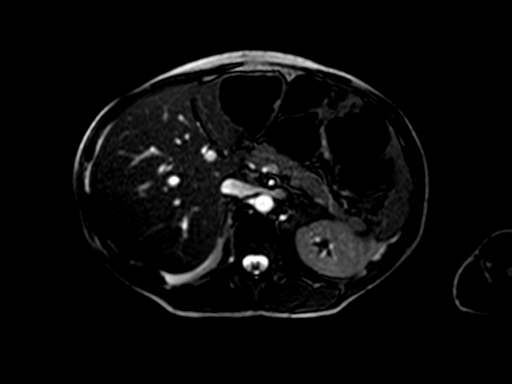
[im 27/41]
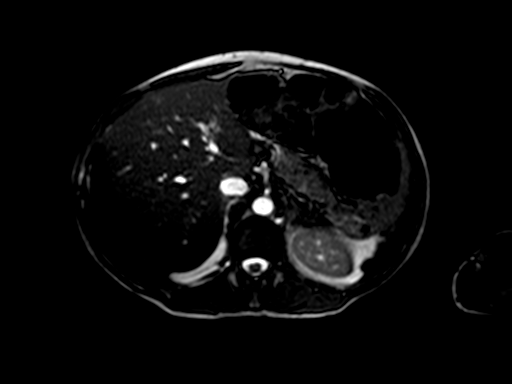
[im 34/41]
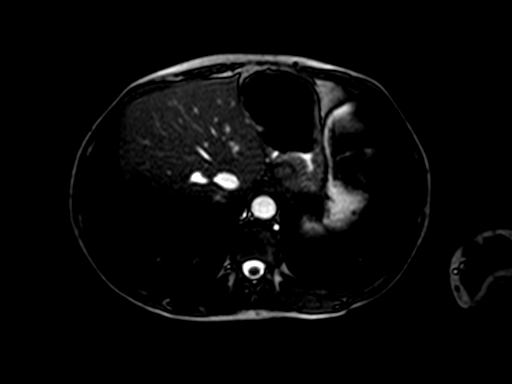
[im 41/41]
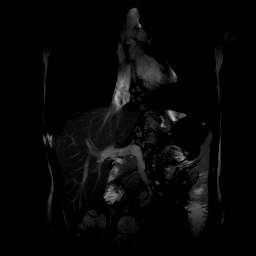

[Series 3: t2_axial_mbh tse · axial · 8.0mm · 0.78mm/px · z∈[-136,+200]mm · 6 of 21 slices shown]
[im 1/21]
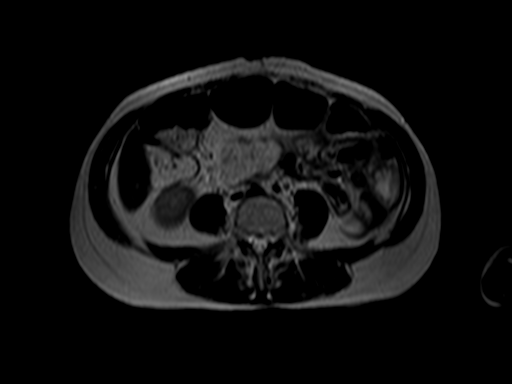
[im 5/21]
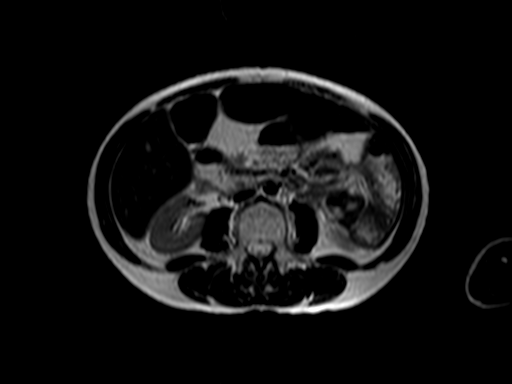
[im 9/21]
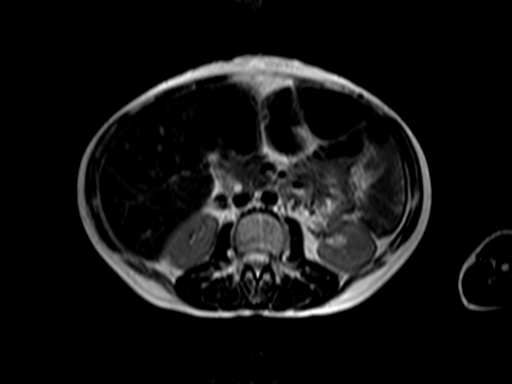
[im 13/21]
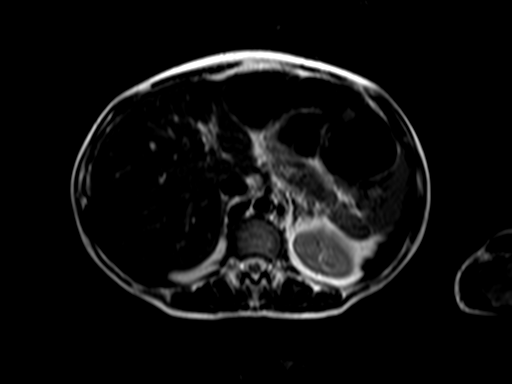
[im 17/21]
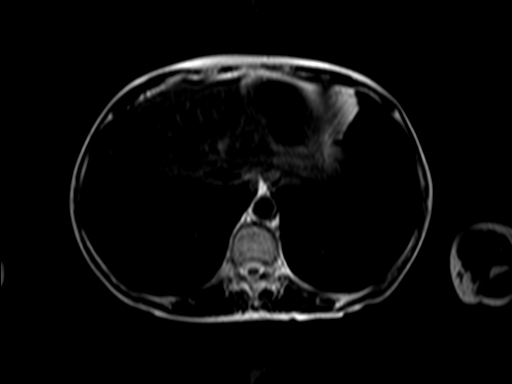
[im 21/21]
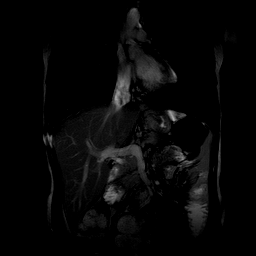

[Series 4: t2_cor_trufi bh new · axial · 4.0mm · 0.78mm/px · z∈[-40,+204]mm · 7 of 37 slices shown]
[im 1/37]
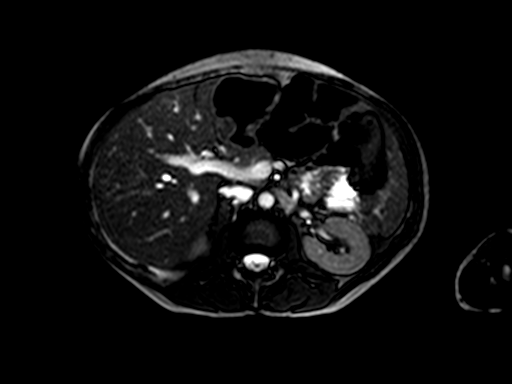
[im 4/37]
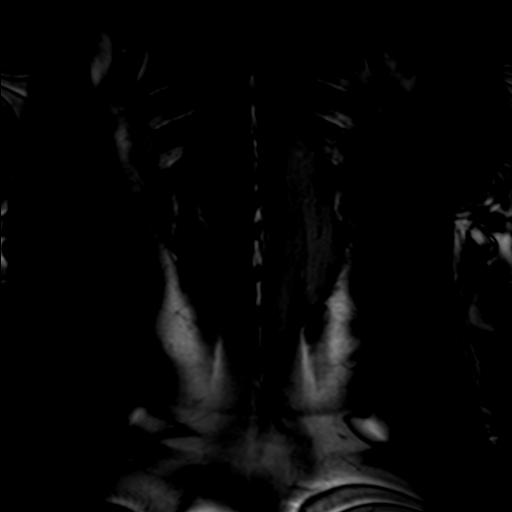
[im 8/37]
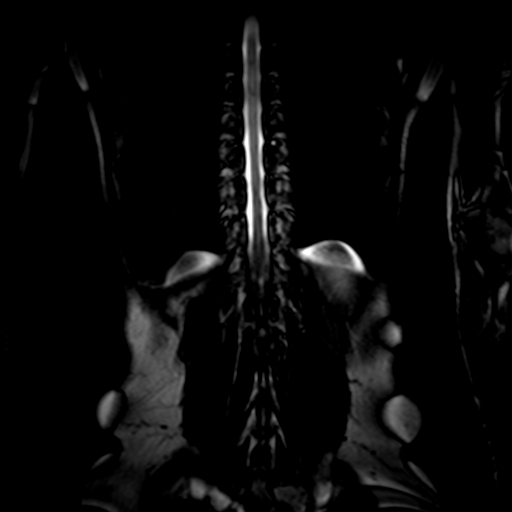
[im 11/37]
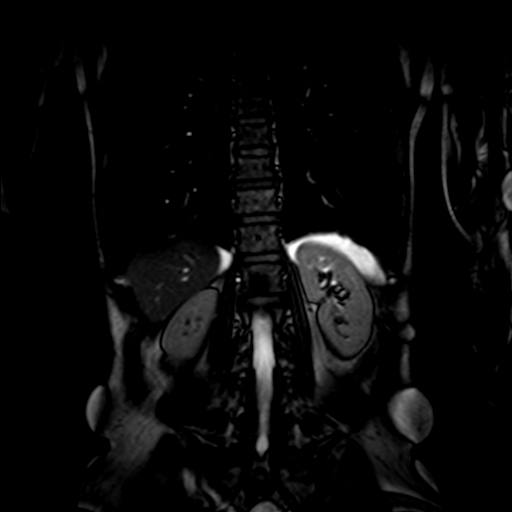
[im 15/37]
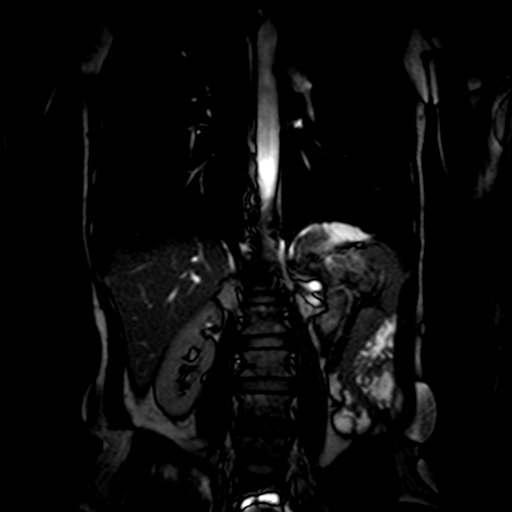
[im 19/37]
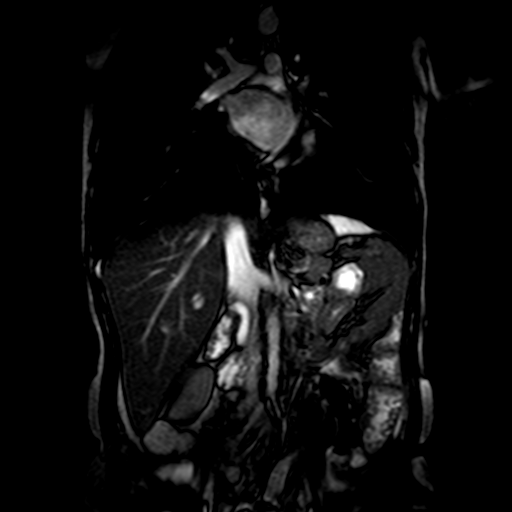
[im 33/37]
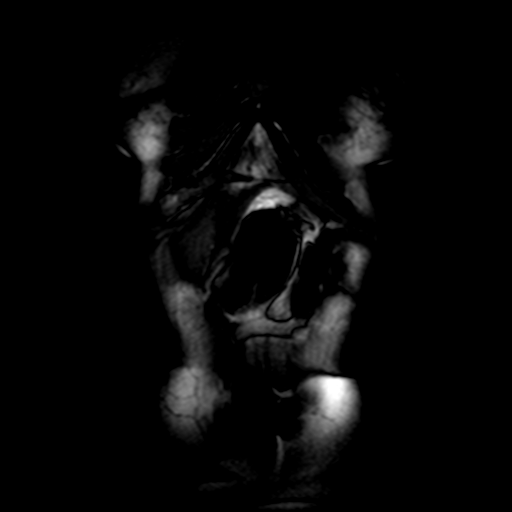

[Series 5: T1 · axial · 8.0mm · 0.78mm/px · z∈[-95,+200]mm · 3 of 20 slices shown]
[im 4/20]
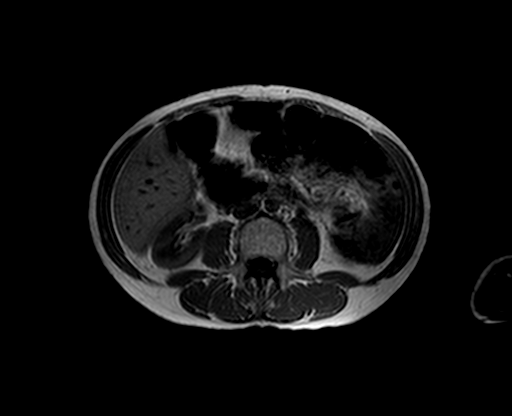
[im 12/20]
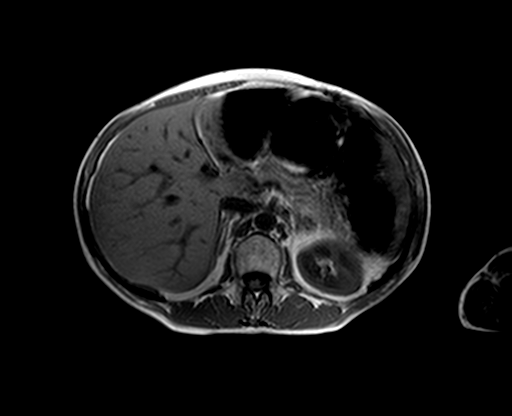
[im 20/20]
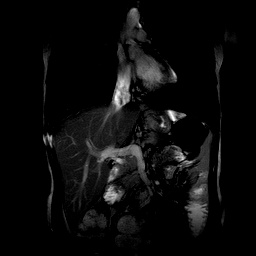

[25 of 48 positions shown; findings below may reference images not displayed]

PROCEDURE:     MR  - MR CHOLANGIOGRAM  - August 07, 2005  [DATE]

RESULT:        Maximum intensity projections, 2D reconstructions and coronal
and sagittal source images were obtained.

Evaluation of the common bile duct and visualized intrahepatic ductal
systems demonstrate no abnormalities.  The common bile duct measures 9.9 mm
in diameter though this is within normal limits considering the patient's
history of a previous cholecystectomy.  There is no evidence of focal
dilatation, strictural narrowing, fusiform dilatation or focal outpouchings.
 The proximal and mid pancreatic duct is also appreciated and demonstrates
no abnormalities.  The common bile duct and pancreatic duct are demonstrated
to the level of the duodenum.  Gross evaluation of the abdomen demonstrates
no gross masses.  There is no evidence of filling defects within the common
bile duct or visualized hepatic ducts.
IMPRESSION: Unremarkable MRCP as described above.  The common bile duct is ectatic
though this is likely secondary to the patient's previous cholecystectomy in

## 2007-02-23 ENCOUNTER — Ambulatory Visit: Payer: Self-pay | Admitting: Surgery

## 2007-03-09 ENCOUNTER — Ambulatory Visit: Payer: Self-pay | Admitting: Surgery

## 2007-03-23 ENCOUNTER — Ambulatory Visit: Payer: Self-pay | Admitting: Surgery

## 2007-03-31 ENCOUNTER — Ambulatory Visit: Payer: Self-pay | Admitting: Unknown Physician Specialty

## 2007-04-06 ENCOUNTER — Encounter: Payer: Self-pay | Admitting: Surgery

## 2007-04-12 ENCOUNTER — Encounter: Payer: Self-pay | Admitting: Surgery

## 2007-04-16 ENCOUNTER — Ambulatory Visit: Payer: Self-pay

## 2007-04-24 ENCOUNTER — Ambulatory Visit: Payer: Self-pay

## 2007-04-29 IMAGING — CR DG ABDOMEN 3V
1 series · 3 of 3 positions shown · non-contrast
Comparison: none

REASON FOR EXAM: small bowel obstruction
COMMENTS:

[Series 1: view not recorded · 0.17mm/px · 3 of 3 slices shown]
[im 1/3]
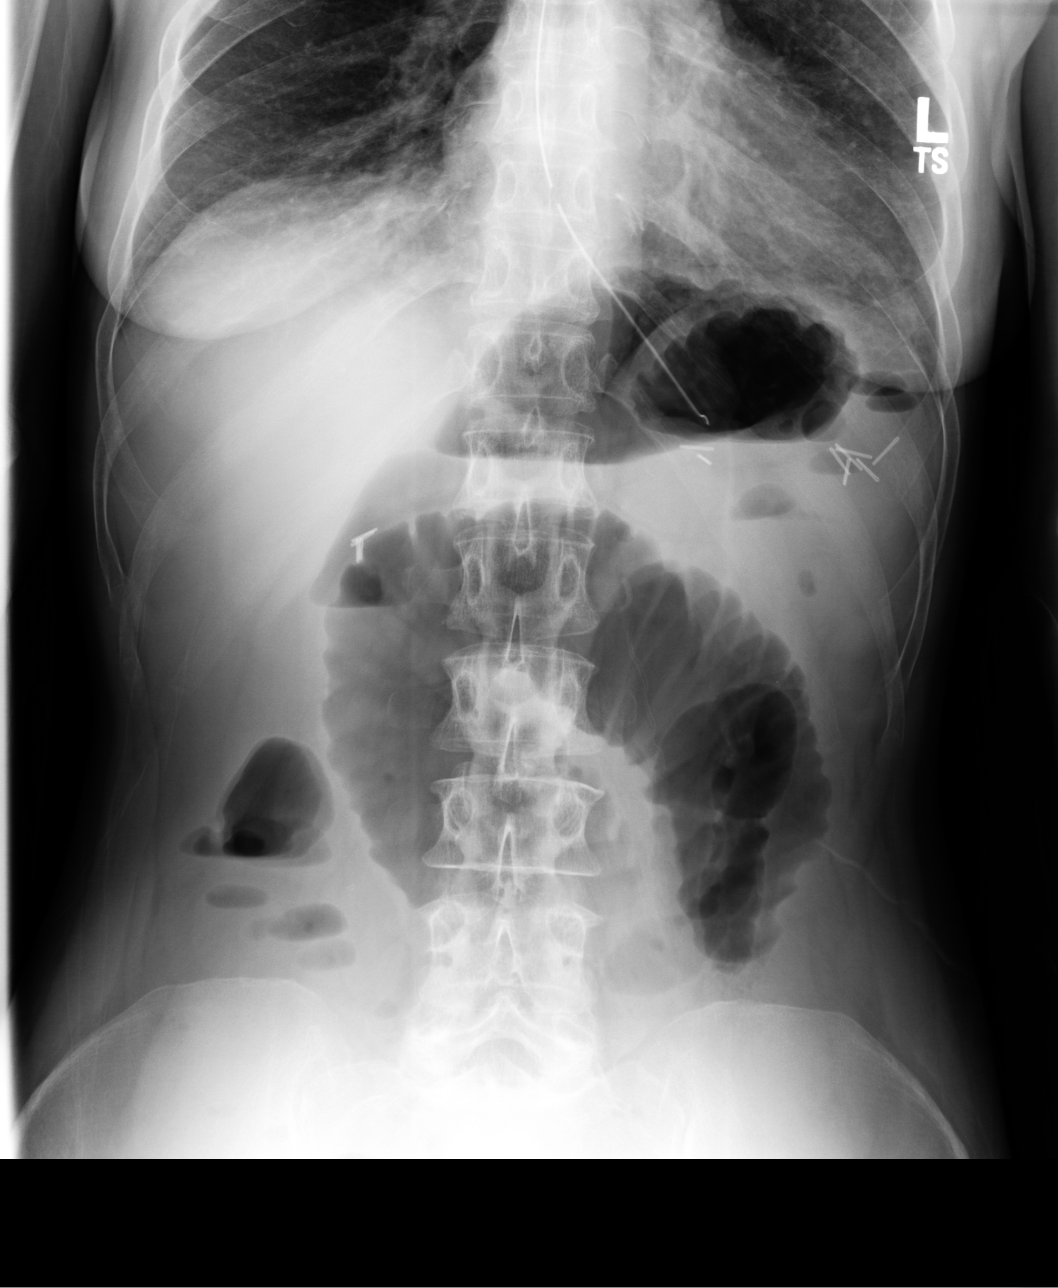
[im 2/3]
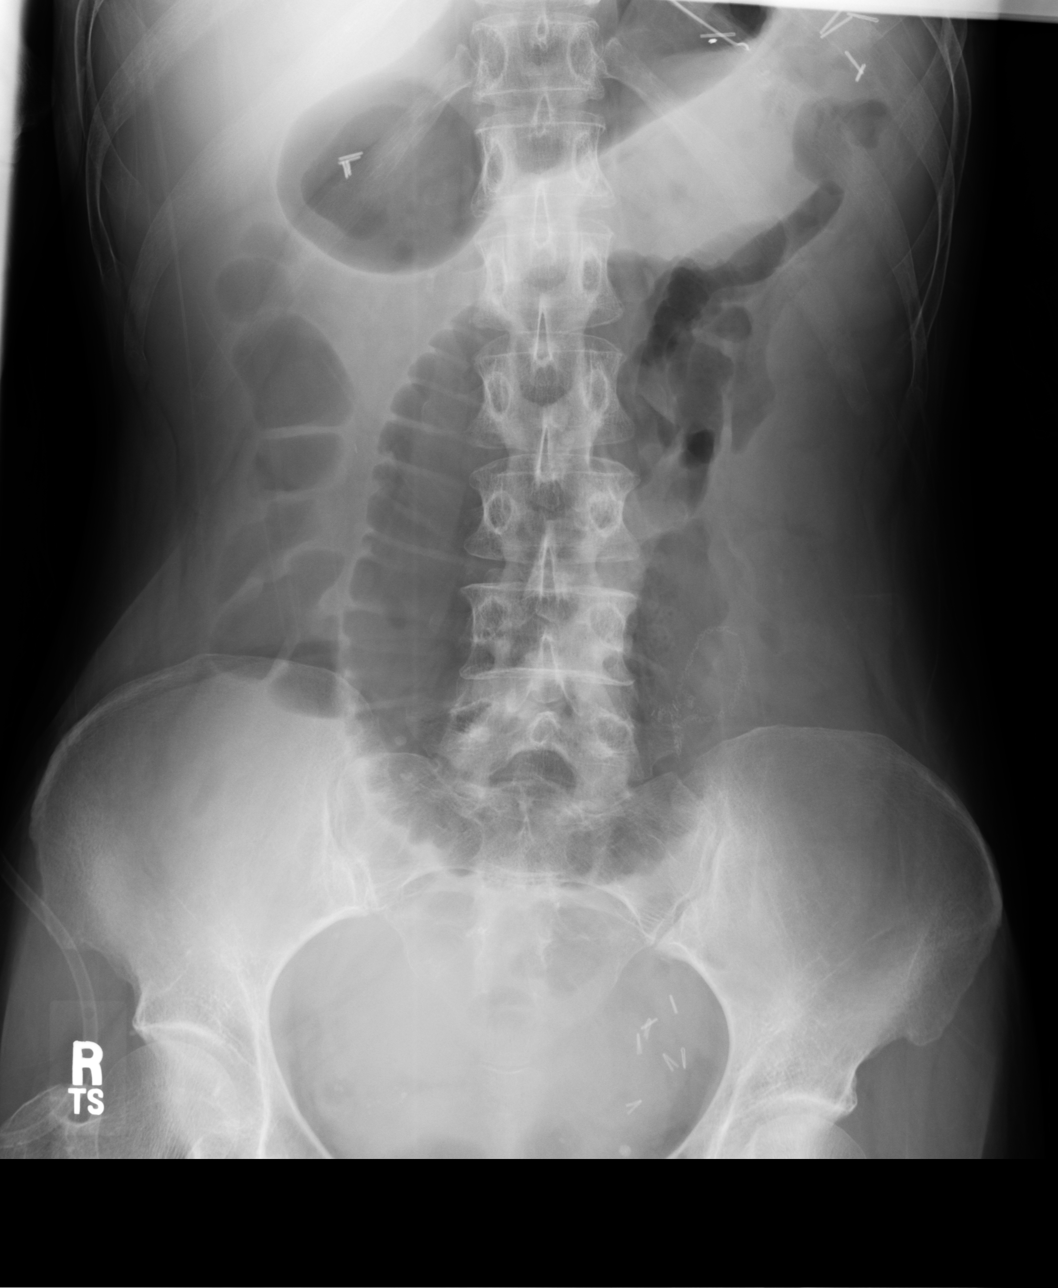
[im 3/3]
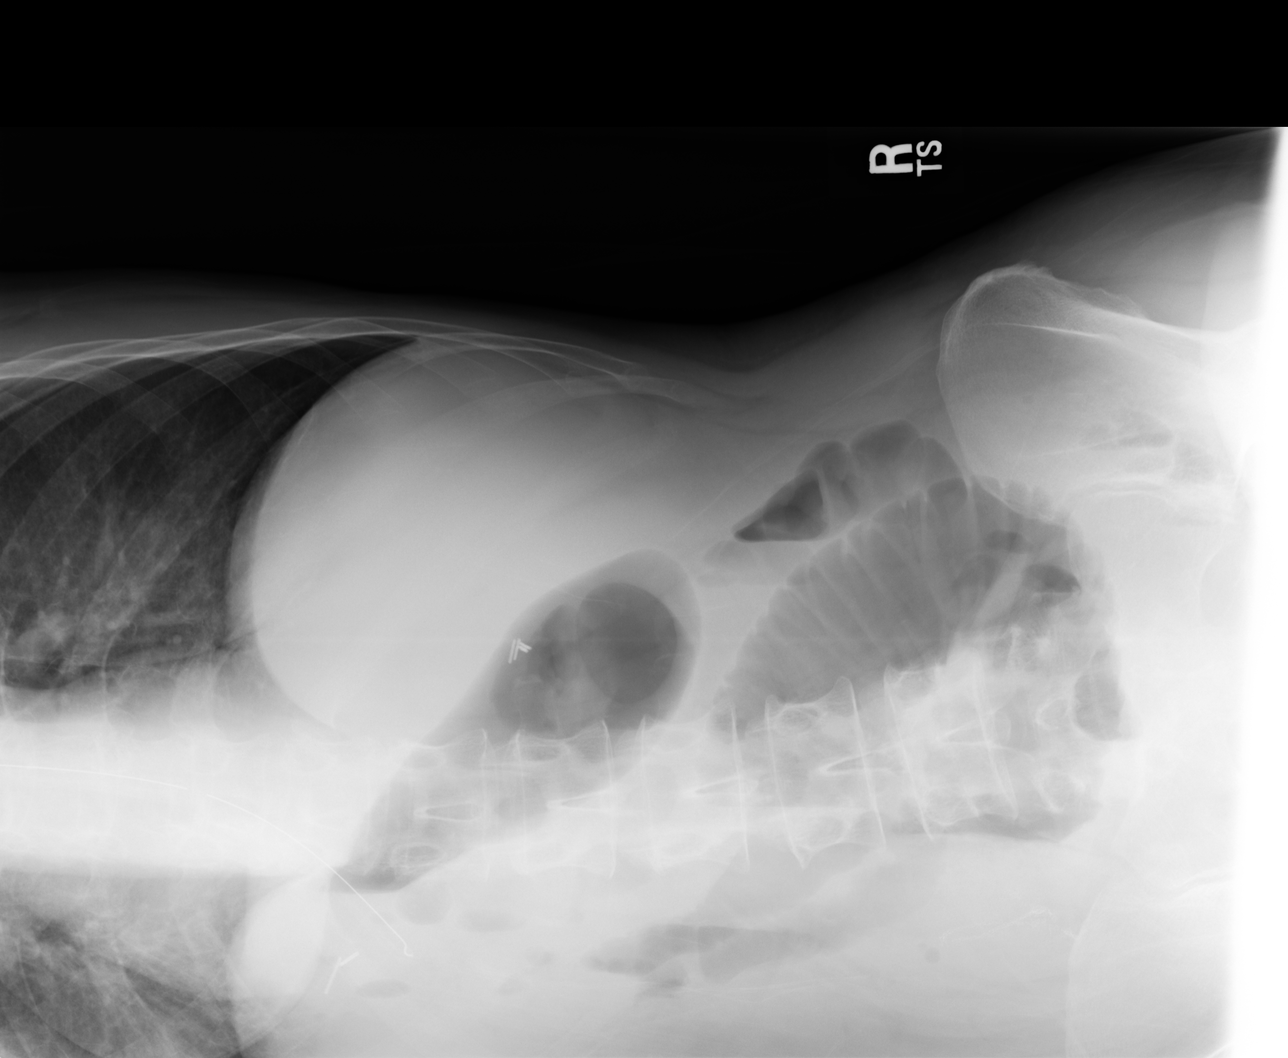

[3 of 3 positions shown; findings below may reference images not displayed]

PROCEDURE:     DXR - DXR ABDOMEN COMPLETE  - October 18, 2005  [DATE]

RESULT:     Flat and erect views of the abdomen were obtained. No
subdiaphragmatic free air is seen. A nasogastric tube is present with the
distal portion in the gastric fundus, but with the first port in the distal
esophagus above the level of the hemidiaphragms.  There is noted a
prominently dilated loop of small bowel in the mid abdomen. There are
scattered, small nonspecific air fluid levels in both the RIGHT and LEFT
abdomen.  There is a relative decrease in colon gas.  The general appearance
is suggestive of small bowel obstruction although a marked ileus could
produce a similar appearance radiographically.  No abnormal intraabdominal
calcifications are seen.  Postoperative metallic clips are noted in the
region of the gallbladder bed and in the LEFT upper quadrant.
IMPRESSION: 1)There is a prominently dilated loop of small bowel in the mid abdomen.

2)Scattered, small air fluid levels are noted bilaterally.

3)Partial small bowel obstruction would be the first consideration, but an
ileus could produce similar findings.

4)Continued follow-up is recommended.

## 2007-04-29 IMAGING — CR DG CHEST 2V
1 series · 2 of 2 positions shown · non-contrast
Comparison: none

REASON FOR EXAM: Small bowel obstruction.
COMMENTS:

[Series 1: view not recorded · 0.17mm/px · 2 of 2 slices shown]
[im 1/2]
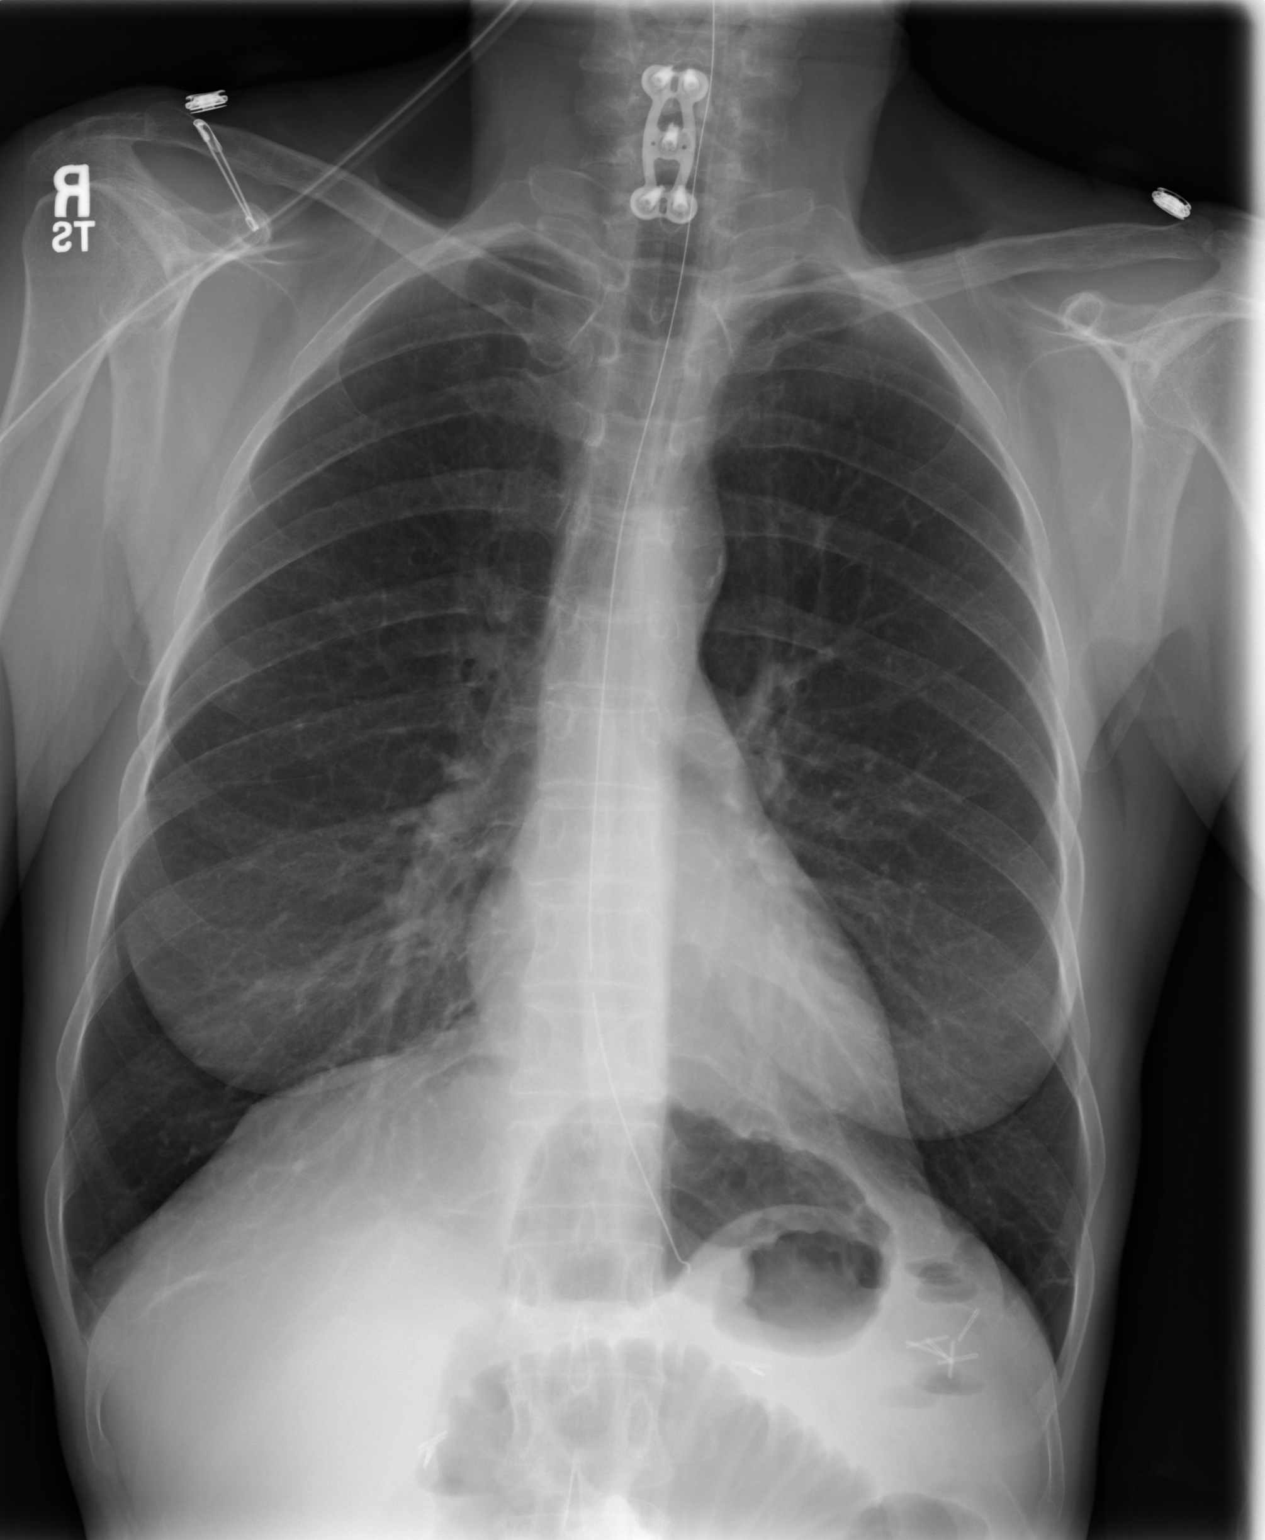
[im 2/2]
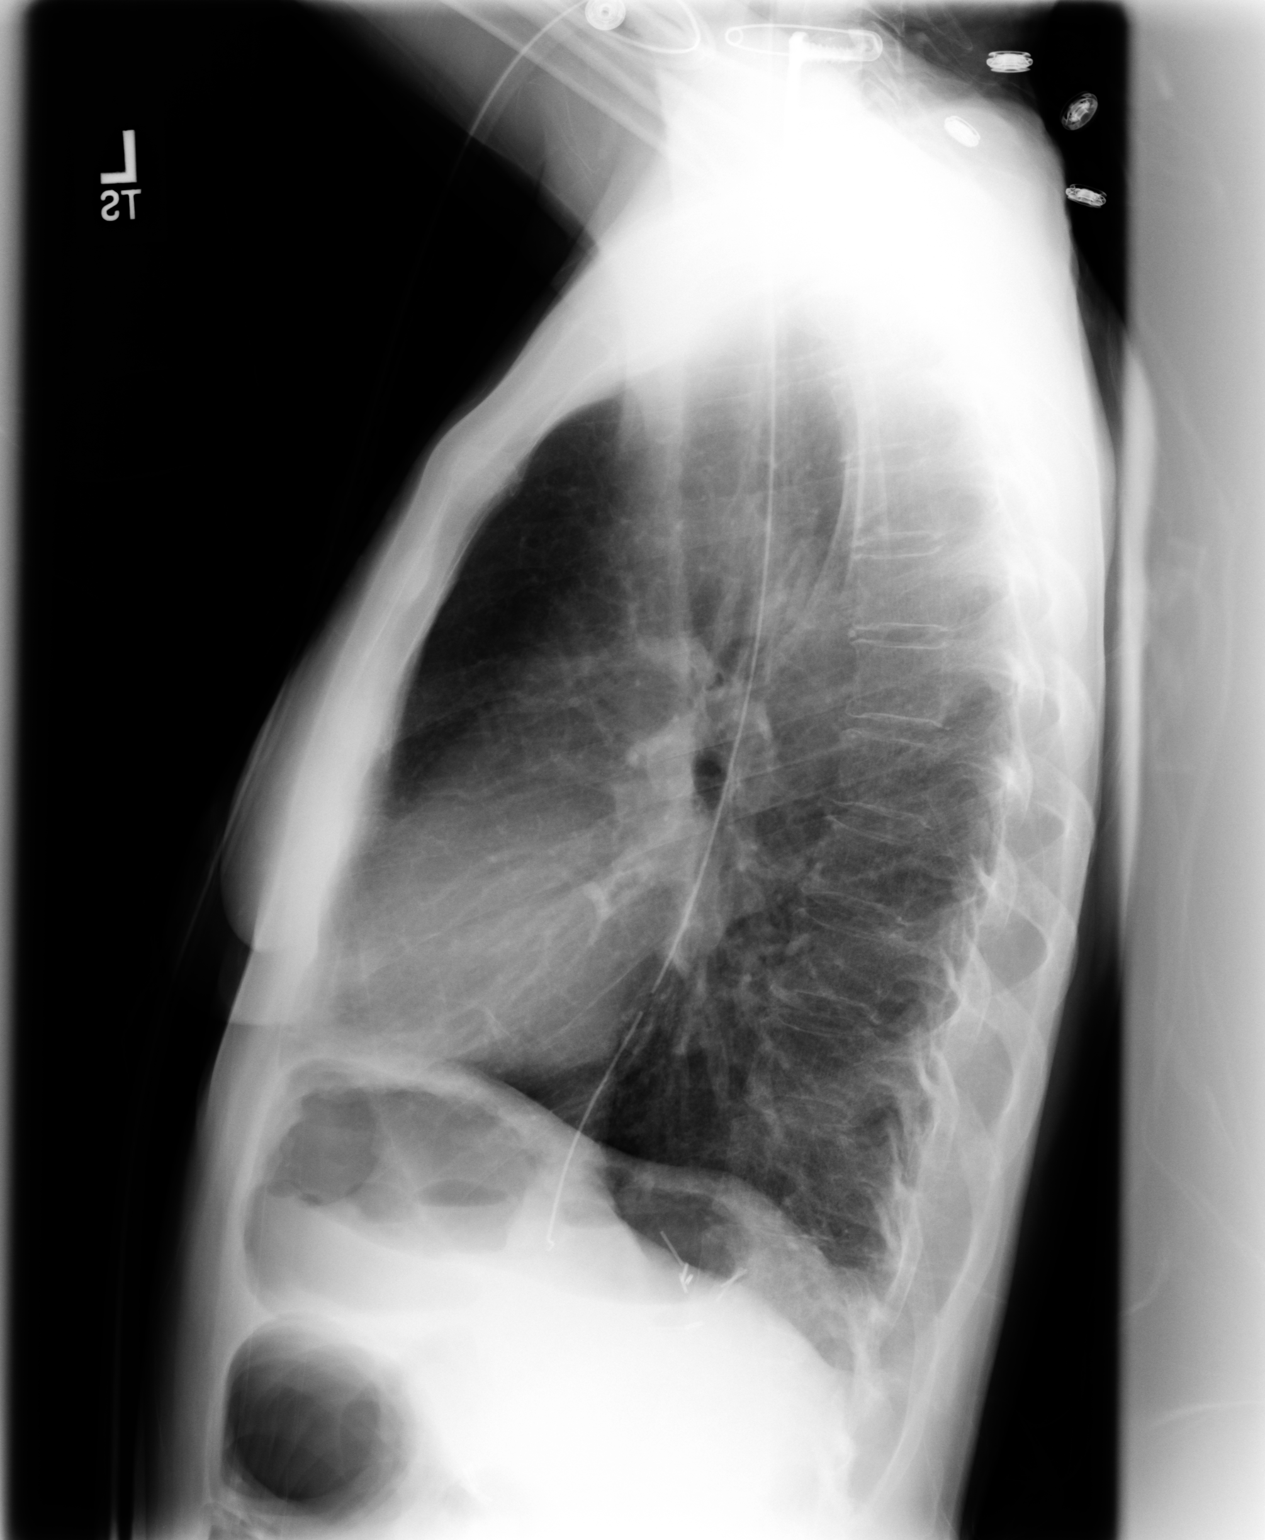

[2 of 2 positions shown; findings below may reference images not displayed]

PROCEDURE:     DXR - DXR CHEST PA (OR AP) AND LATERAL  - October 18, 2005  [DATE]

RESULT:     The lung fields are clear. No pneumonia, pneumothorax or pleural
effusion is seen. Heart size is normal. A nasogastric tube is present with
the distal portion projected over the stomach.  The tube should probably be
advanced a few cms so that all of the suction ports are below the diaphragm.
The lungs are hyperexpanded compatible with COPD.
IMPRESSION: 1)The lung fields are clear.

2)COPD.

3)A nasogastric tube is present with the superior ports appearing to be
above the level of the hemidiaphragms.

## 2007-05-01 IMAGING — CT CT ABD-PELV W/ CM
1 of 2 series · 15 of 32 positions shown, 19 images · non-contrast
Comparison: none

REASON FOR EXAM: Small bowel obstruction
COMMENTS:

[Series 2: abdomen · axial · 0.59mm/px · z∈[-445,-53]mm · 15 of 55 slices shown, 19 images]
[im 3/55  soft-tissue]
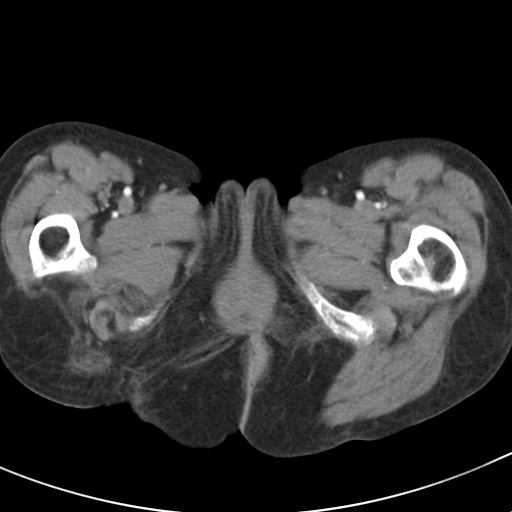
[im 3/55  bone]
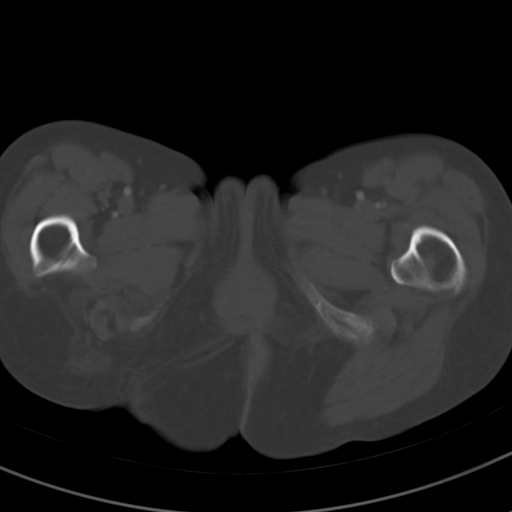
[im 7/55  soft-tissue]
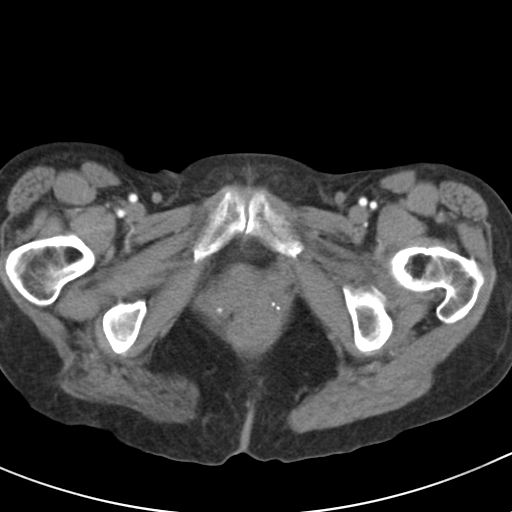
[im 11/55  soft-tissue]
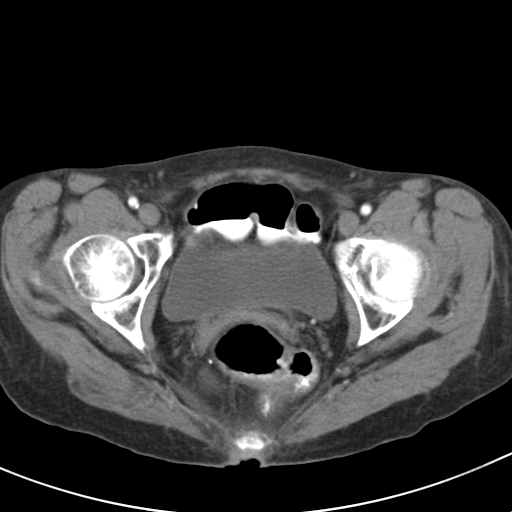
[im 16/55  soft-tissue]
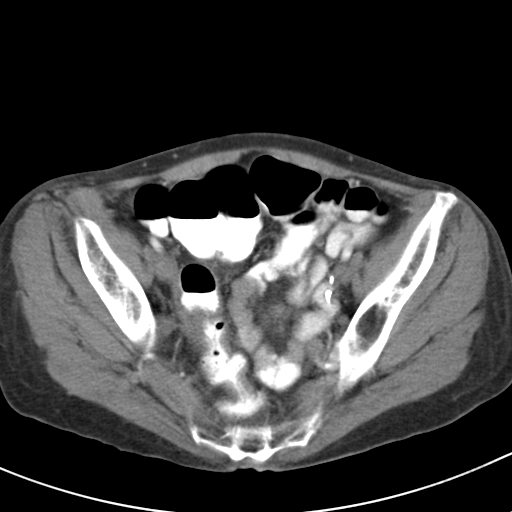
[im 20/55  soft-tissue]
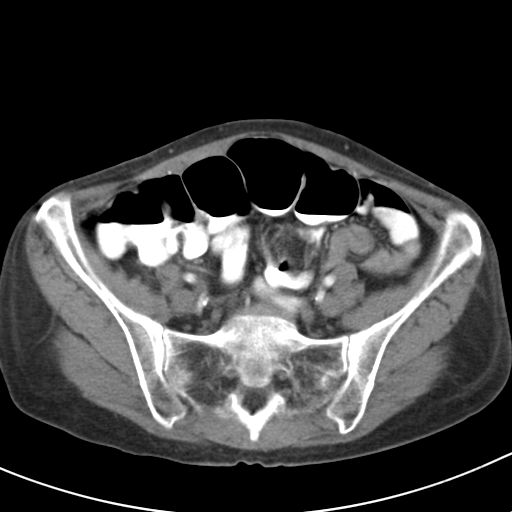
[im 24/55  soft-tissue]
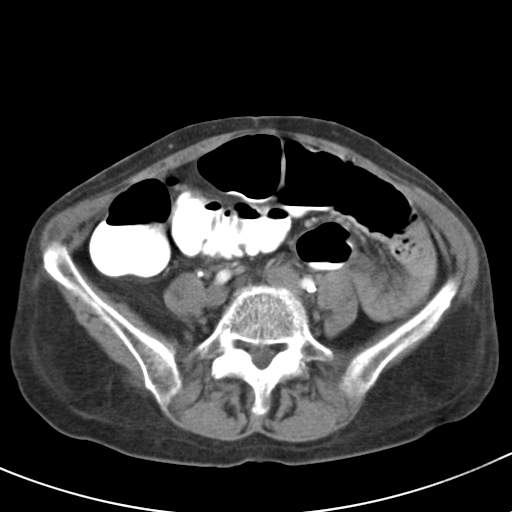
[im 29/55  soft-tissue]
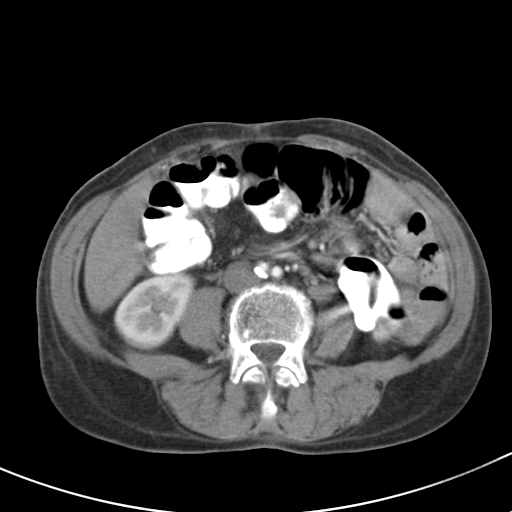
[im 31/55  soft-tissue]
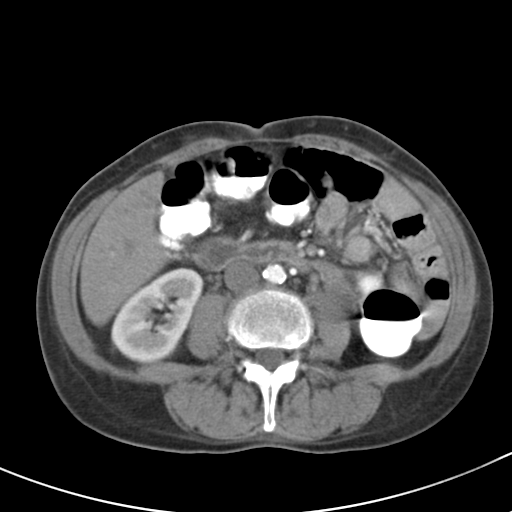
[im 35/55  soft-tissue]
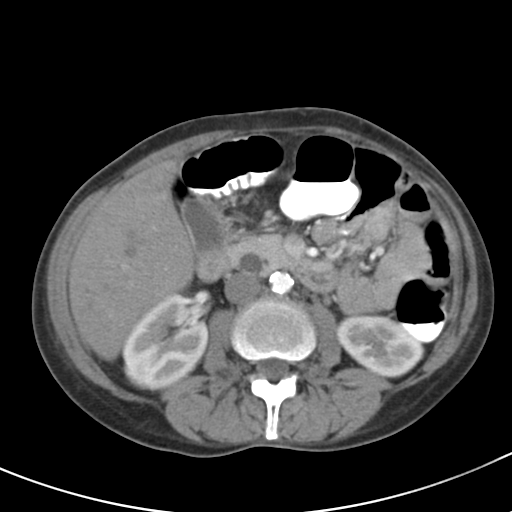
[im 35/55  bone]
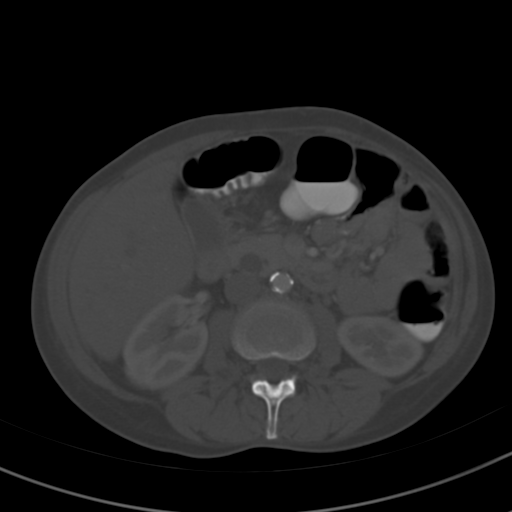
[im 39/55  soft-tissue]
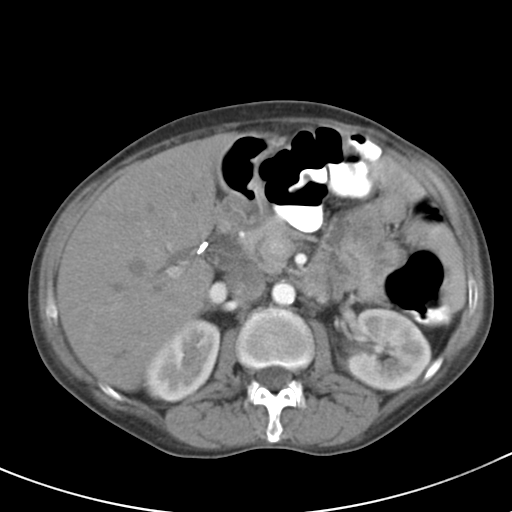
[im 44/55  soft-tissue]
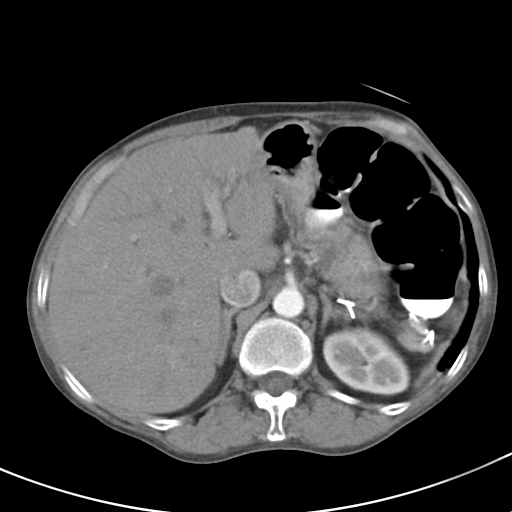
[im 46/55  lung]
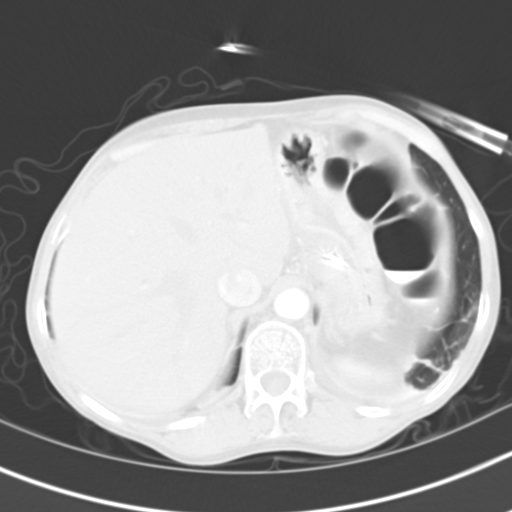
[im 48/55  soft-tissue]
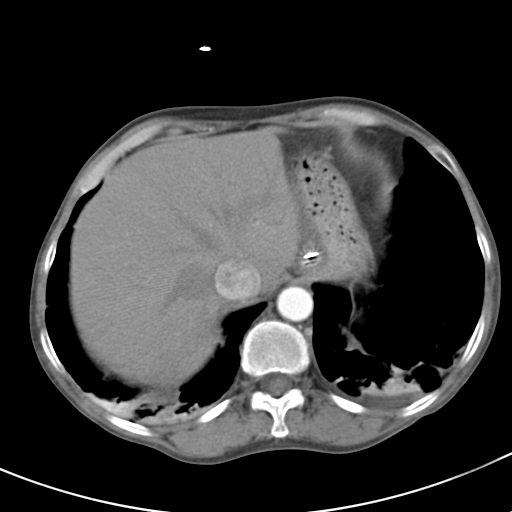
[im 48/55  lung]
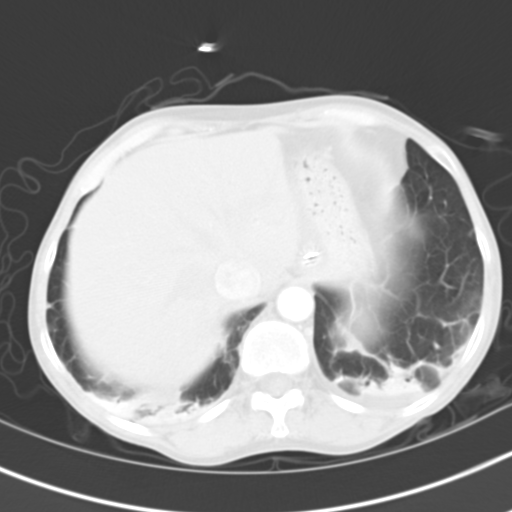
[im 50/55  lung]
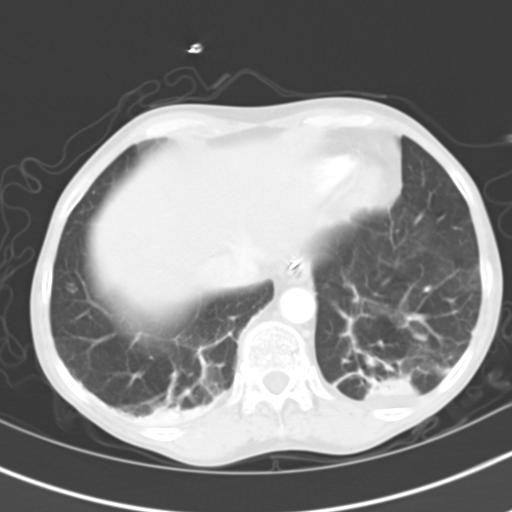
[im 52/55  soft-tissue]
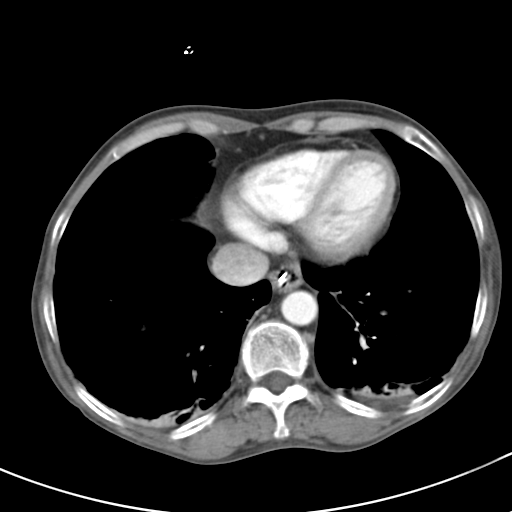
[im 52/55  lung]
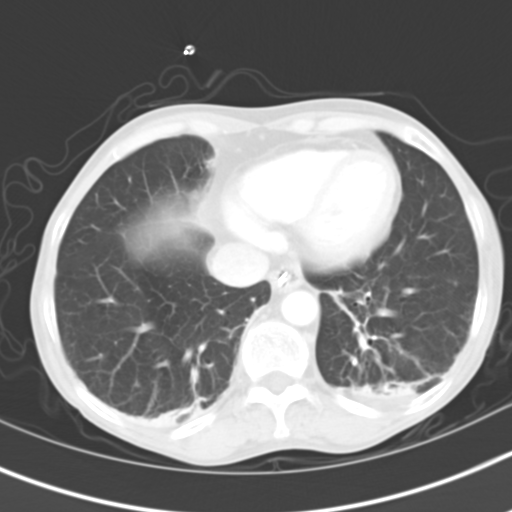

[15 of 32 positions shown; findings below may reference images not displayed]

PROCEDURE:     CT  - CT ABDOMEN / PELVIS  W  - October 20, 2005 [DATE]

RESULT:          IV and oral contrast-enhanced CT scan of the abdomen and
pelvis show incomplete opacification of the small bowel.  Oral contrast has
passed through the small bowel into the colon, down to the rectum.  No
significant abnormal bowel distention is present.  No free fluid or abnormal
fluid collections are seen.  No acute inflammatory changes are evident.
Cholecystectomy clips are present.  The pancreas, kidneys, spleen, liver,
and adrenal glands appear to be unremarkable.  There is some trace pleural
fluid bilaterally and some minimal atelectasis at both lung bases.
IMPRESSION: 1.     No evidence of bowel obstruction.  There is a moderate amount of gas
scattered through the colon down to the rectum, and there is some air seen
within nondistended loops of small bowel as well.  Again, the oral contrast
has passed through the small bowel and colon.
2.     Trace pleural effusions with some basilar atelectasis bilaterally.

## 2007-05-02 IMAGING — CR DG ABDOMEN 3V
1 series · 3 of 3 positions shown · non-contrast
Comparison: none

REASON FOR EXAM: Crohns disease, partial small bowel obstruction
COMMENTS:

[Series 1: view not recorded · 0.17mm/px · 3 of 3 slices shown]
[im 1/3]
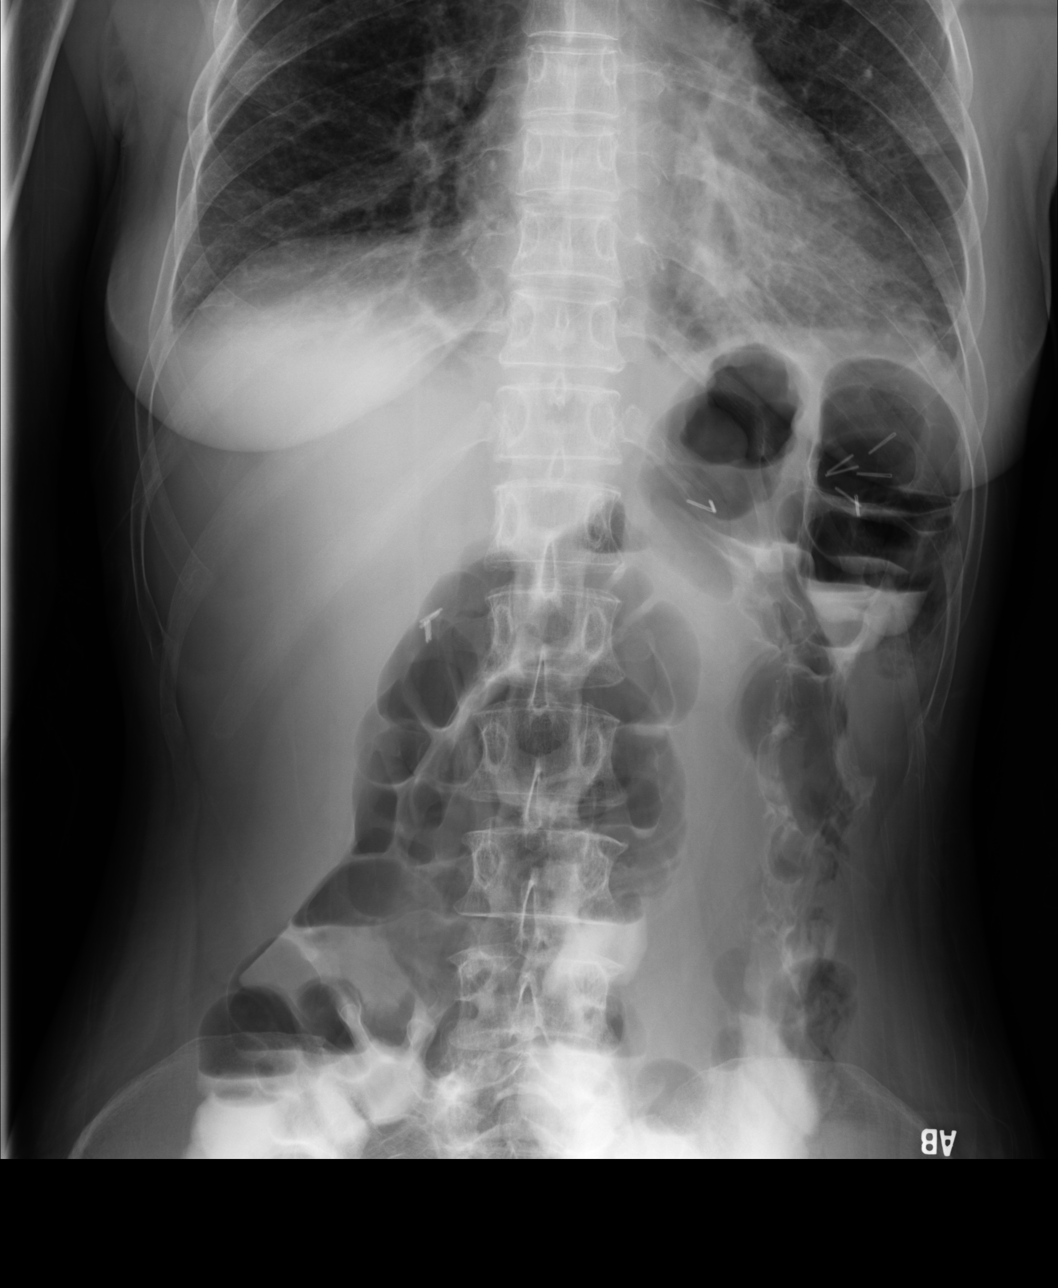
[im 2/3]
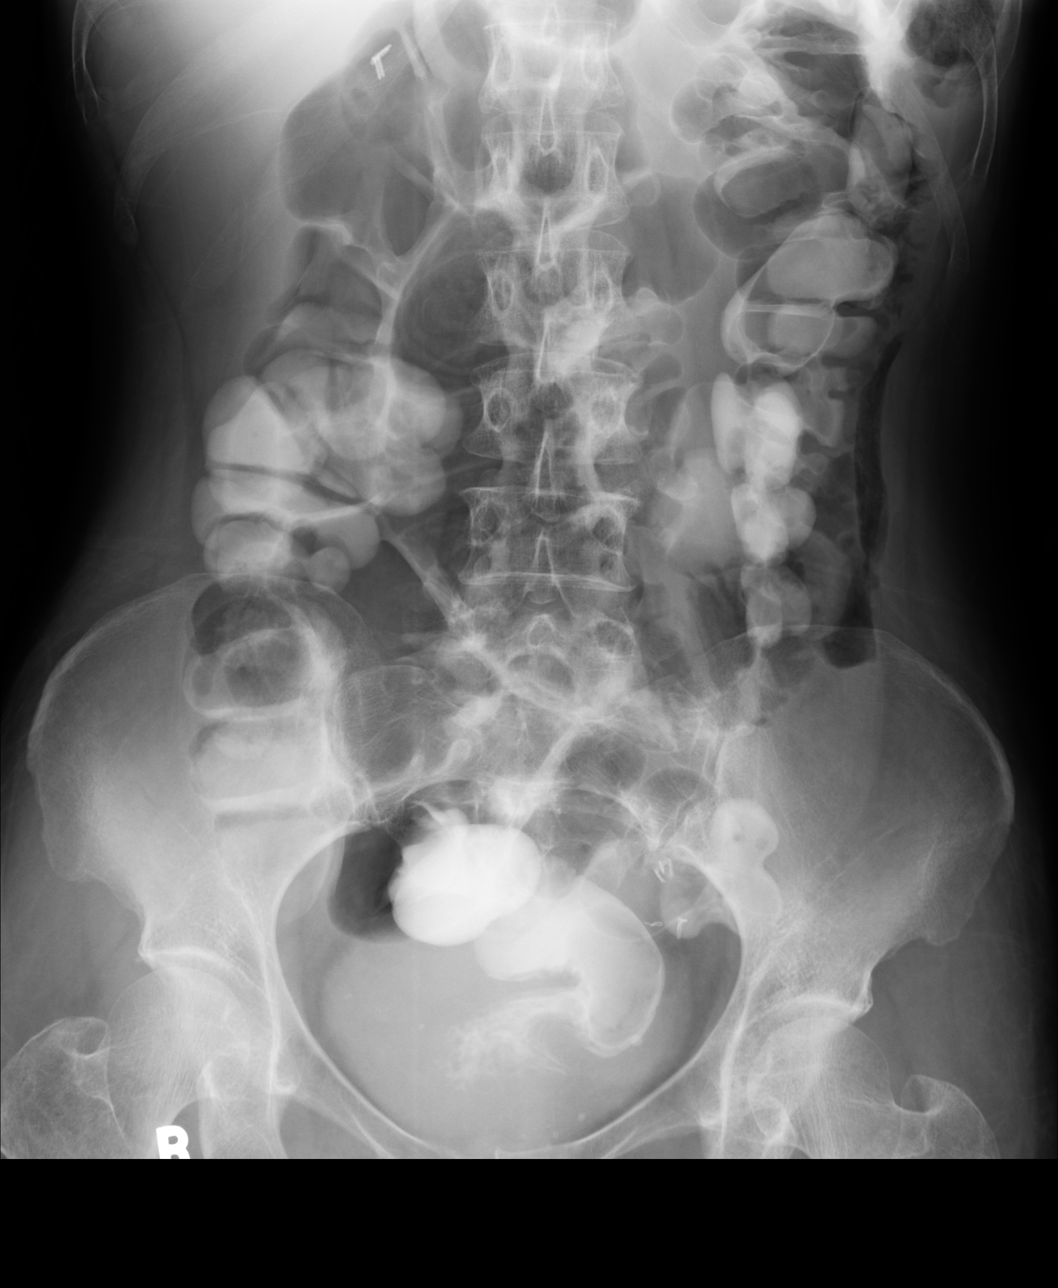
[im 3/3]
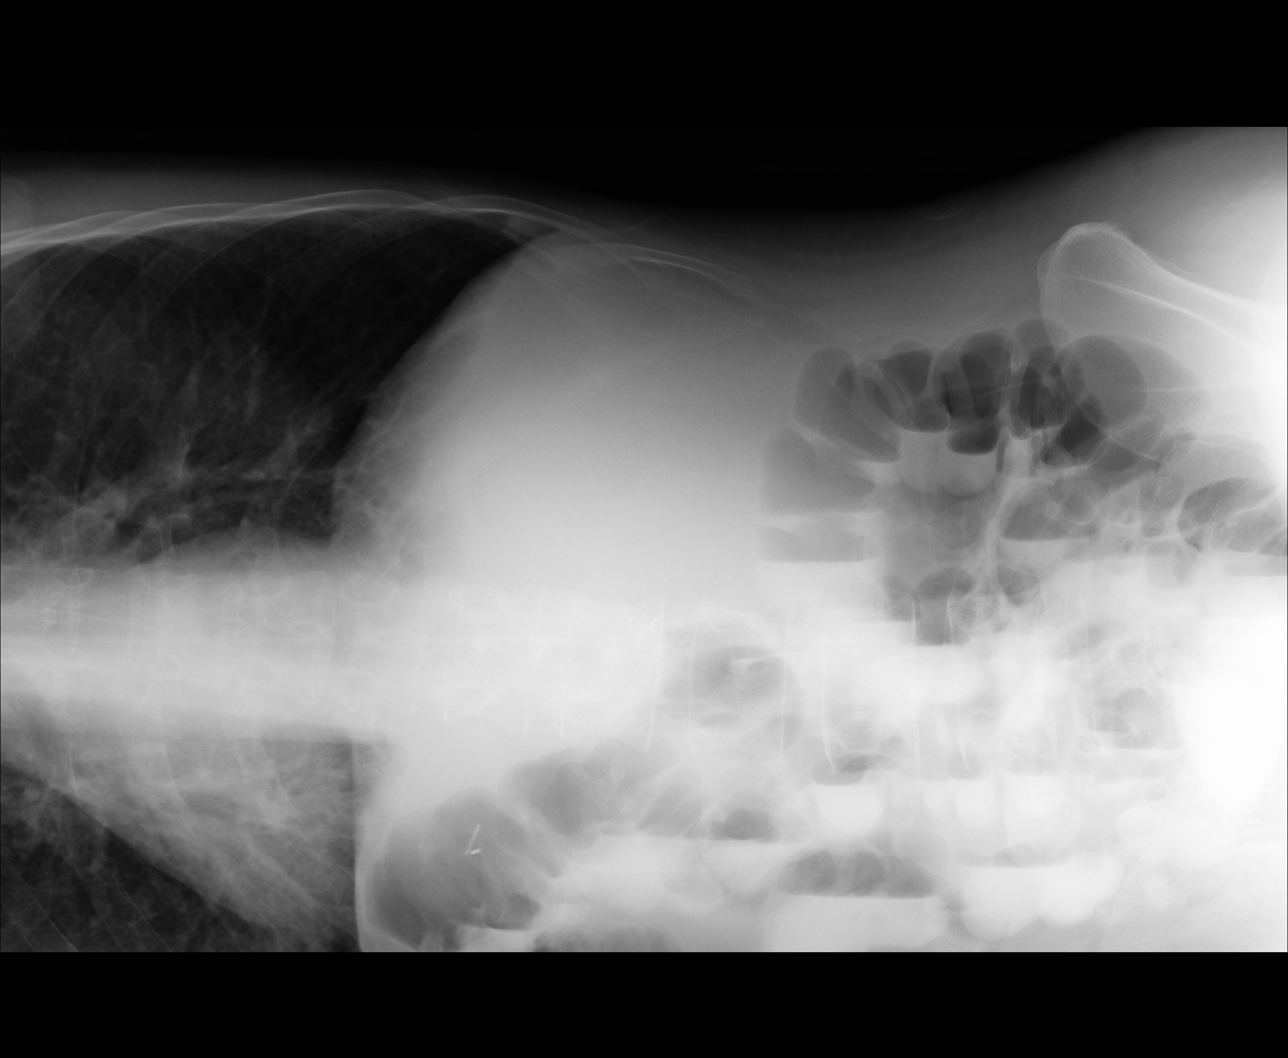

[3 of 3 positions shown; findings below may reference images not displayed]

PROCEDURE:     DXR - DXR ABDOMEN COMPLETE  - October 21, 2005  [DATE]

RESULT:        Three views of the abdomen were obtained.  There is contrast
material in the colon which probably represents oral contrast from prior CT.
 Fluid levels are noted in loops of large and small bowel and in the colon
but no evidence for bowel obstruction is seen.  Such nonspecific fluid
levels are frequently associated with gastroenteritis, enteritis or
intra-abdominal inflammation.  No definitely abnormal intra-abdominal
calcifications are identified.  Postoperative metallic clips are present in
the LEFT upper quadrant in the region of the gallbladder bed.  Additionally,
postoperative metallic clips are seen in the LEFT pelvis.  No abnormal
intra-abdominal calcifications are noted.  No acute bony abnormalities are
seen.
IMPRESSION: 1.     There are nonspecific scattered fluid levels in loops of large and
small bowel.
2.     No evidence for bowel obstruction is seen.

## 2007-05-12 ENCOUNTER — Encounter: Payer: Self-pay | Admitting: Surgery

## 2007-06-12 ENCOUNTER — Encounter: Payer: Self-pay | Admitting: Surgery

## 2007-07-21 ENCOUNTER — Ambulatory Visit: Payer: Self-pay | Admitting: Surgery

## 2007-08-06 ENCOUNTER — Ambulatory Visit: Payer: Self-pay | Admitting: Surgery

## 2007-08-10 ENCOUNTER — Ambulatory Visit: Payer: Self-pay | Admitting: Vascular Surgery

## 2007-08-17 ENCOUNTER — Ambulatory Visit: Payer: Self-pay | Admitting: Surgery

## 2007-08-22 IMAGING — CR DG ABDOMEN 3V
1 series · 4 of 4 positions shown · non-contrast
Comparison: none

REASON FOR EXAM: Distention
COMMENTS:

[Series 1: view not recorded · 0.17mm/px · 4 of 4 slices shown]
[im 1/4]
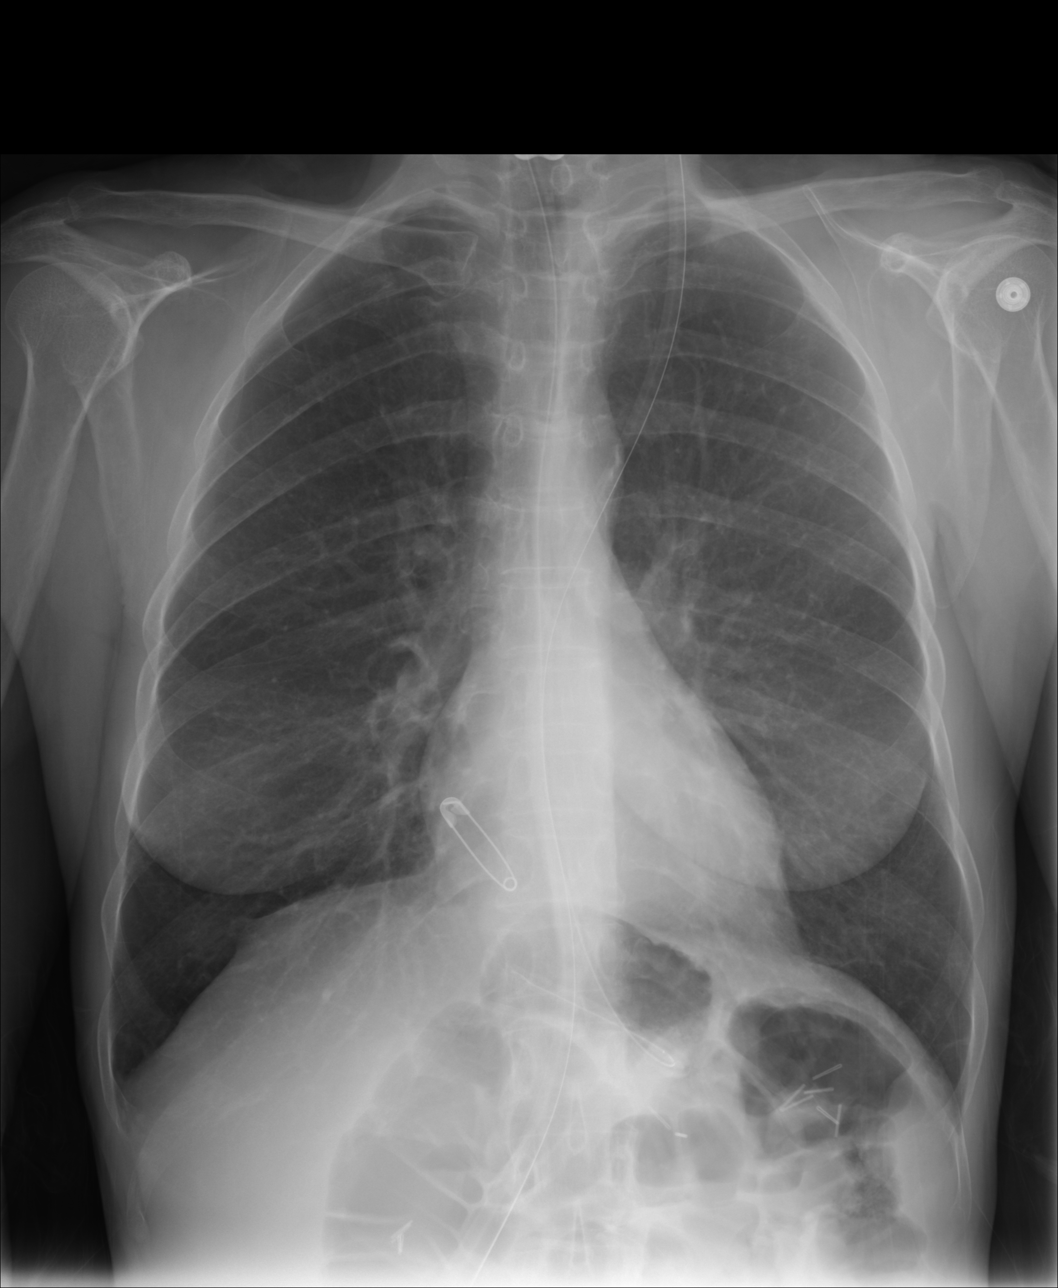
[im 2/4]
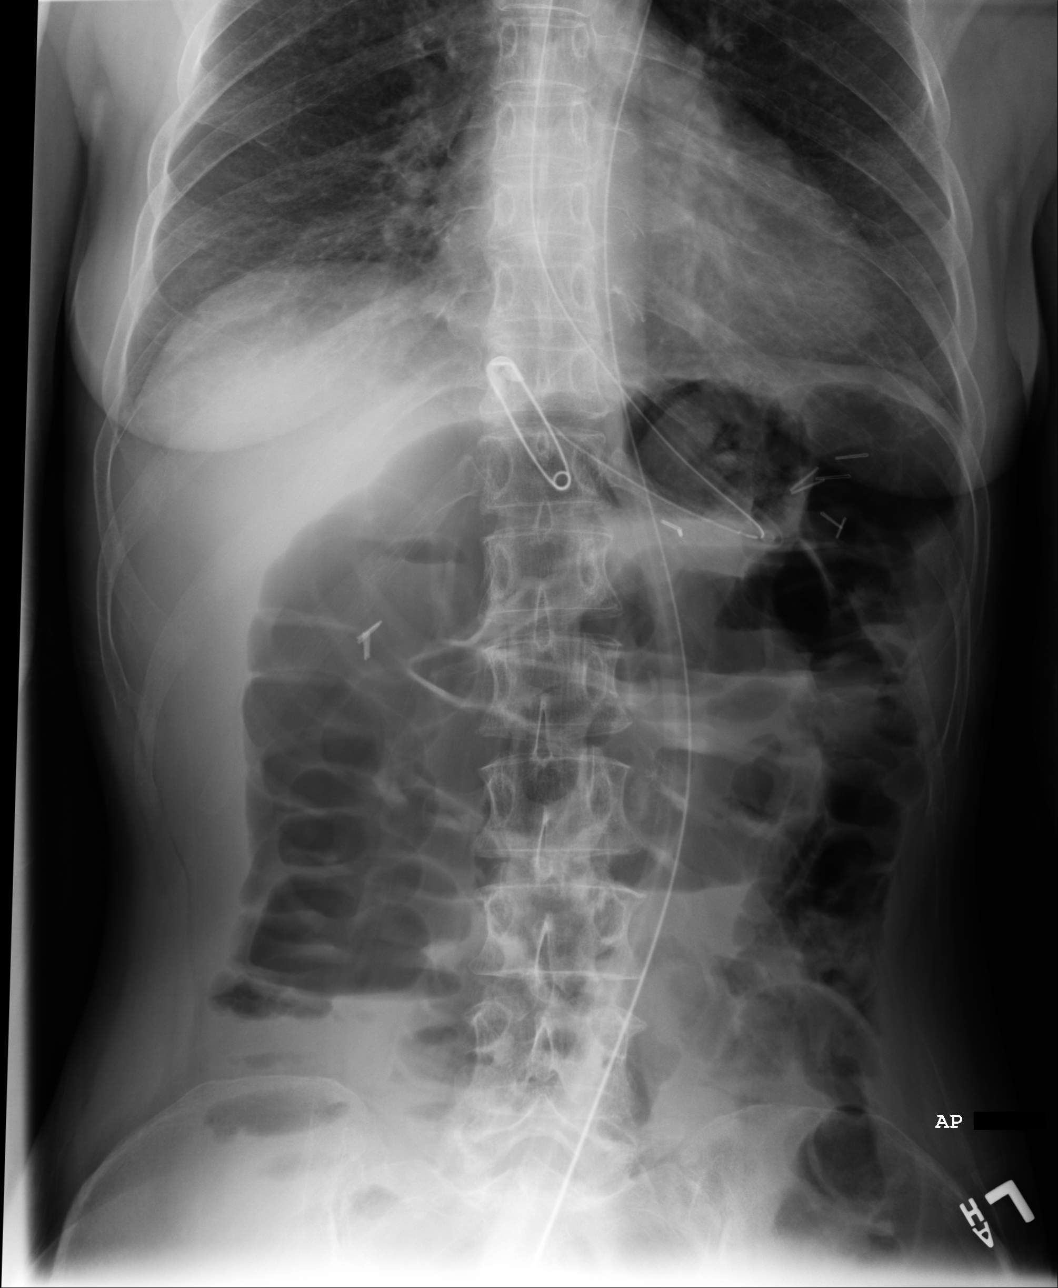
[im 3/4]
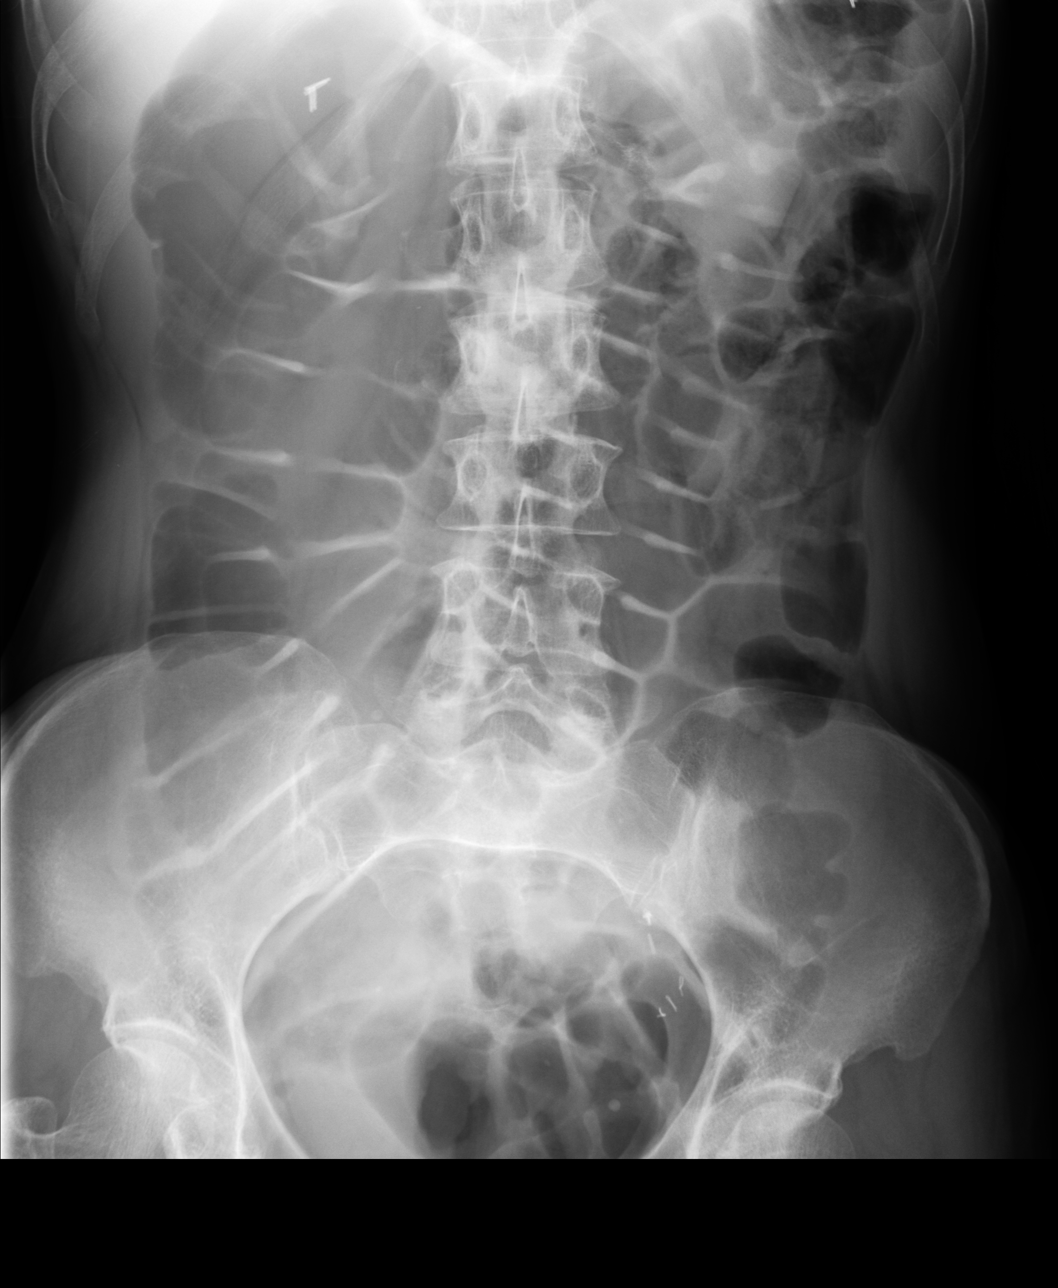
[im 4/4]
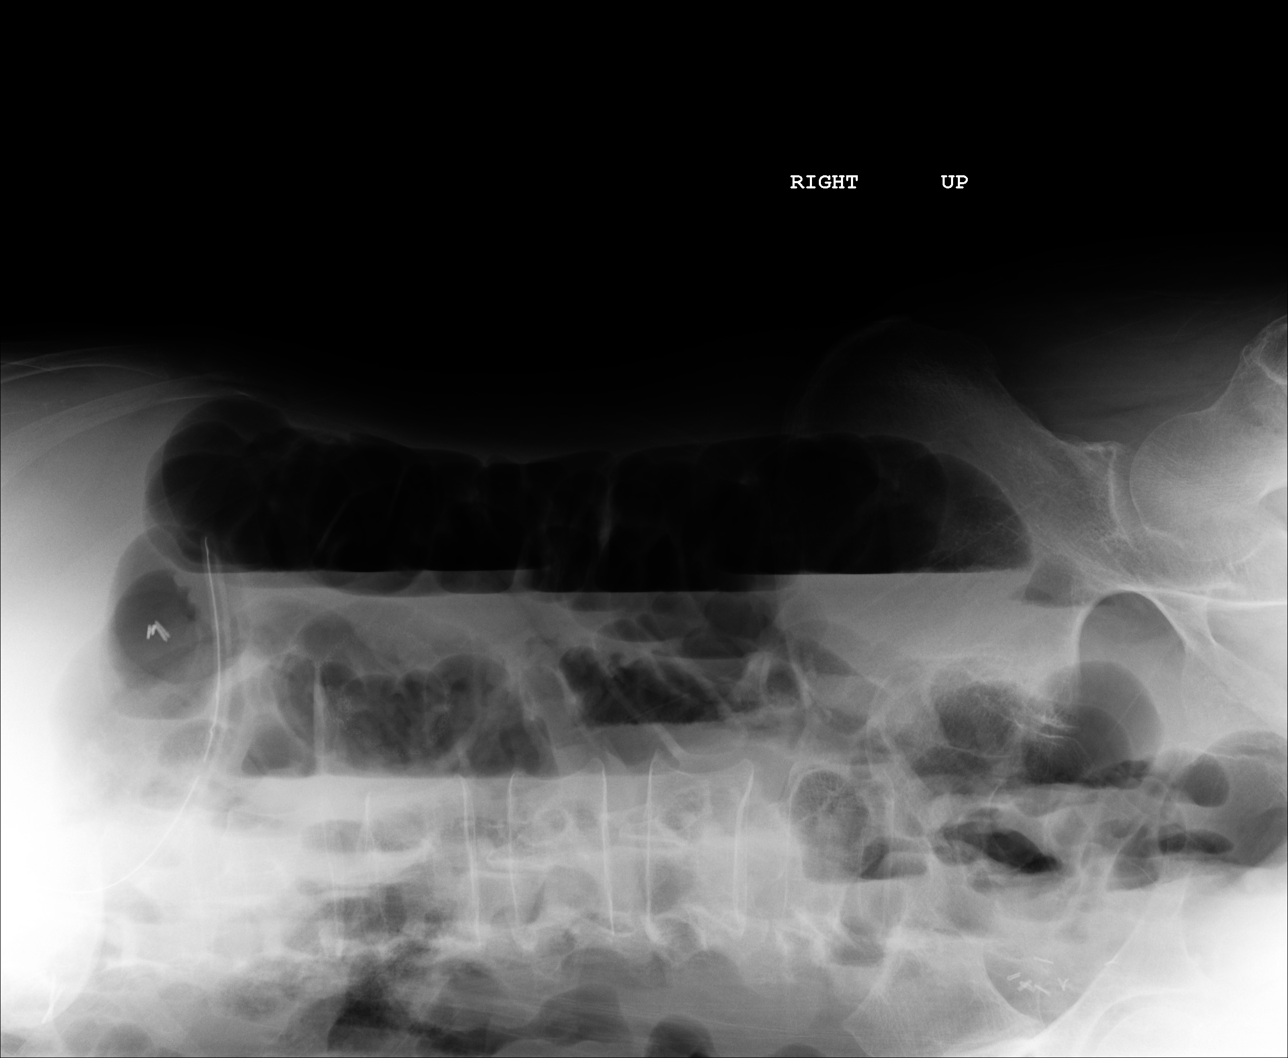

[4 of 4 positions shown; findings below may reference images not displayed]

PROCEDURE:     DXR - DXR ABDOMEN 3-WAY (INCL PA CXR)  - February 10, 2006  [DATE]

RESULT:        There is noted distention of both large and small bowel.  An
NG tube is in place with its tip in the body region of the stomach.   There
are some scattered air-fluid levels seen.  No free intraperitoneal air.
Surgical clips are noted in the LEFT pelvis as well as multiple phleboliths.
 Findings may be compatible with diffuse ileus.

The accompanying chest film reveals the lung fields to be clear.  The heart
is normal in size.
IMPRESSION: Nonspecific gas pattern.  Can be compatible with diffuse ileus.

## 2007-08-23 IMAGING — CR DG ABDOMEN 3V
1 series · 4 of 4 positions shown · non-contrast
Comparison: none

REASON FOR EXAM: Ileus versus partial obstruction
COMMENTS:

[Series 1: view not recorded · 0.17mm/px · 4 of 4 slices shown]
[im 1/4]
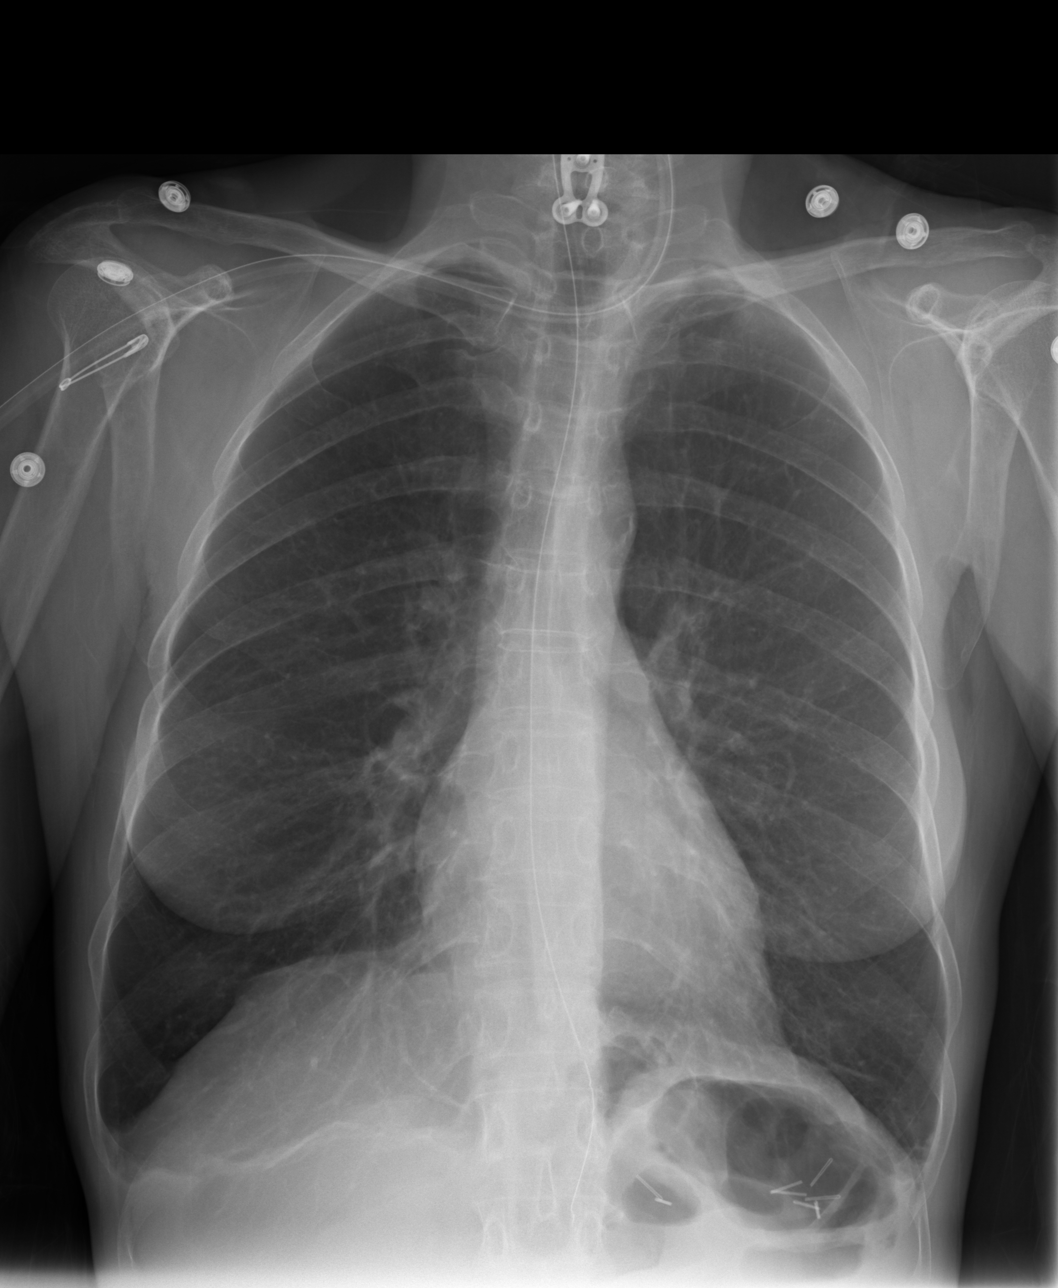
[im 2/4]
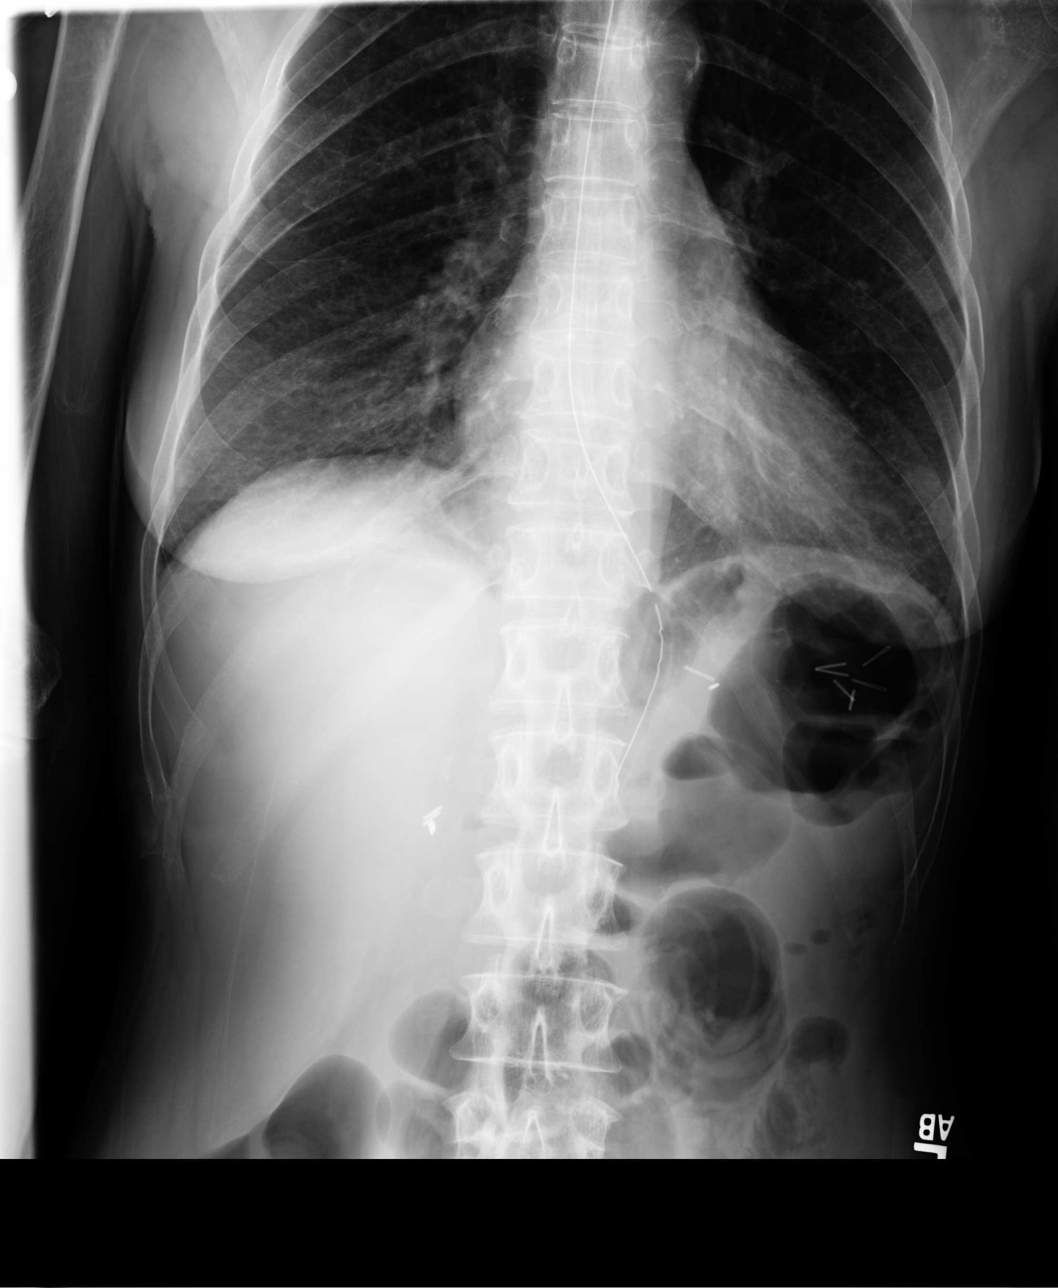
[im 3/4]
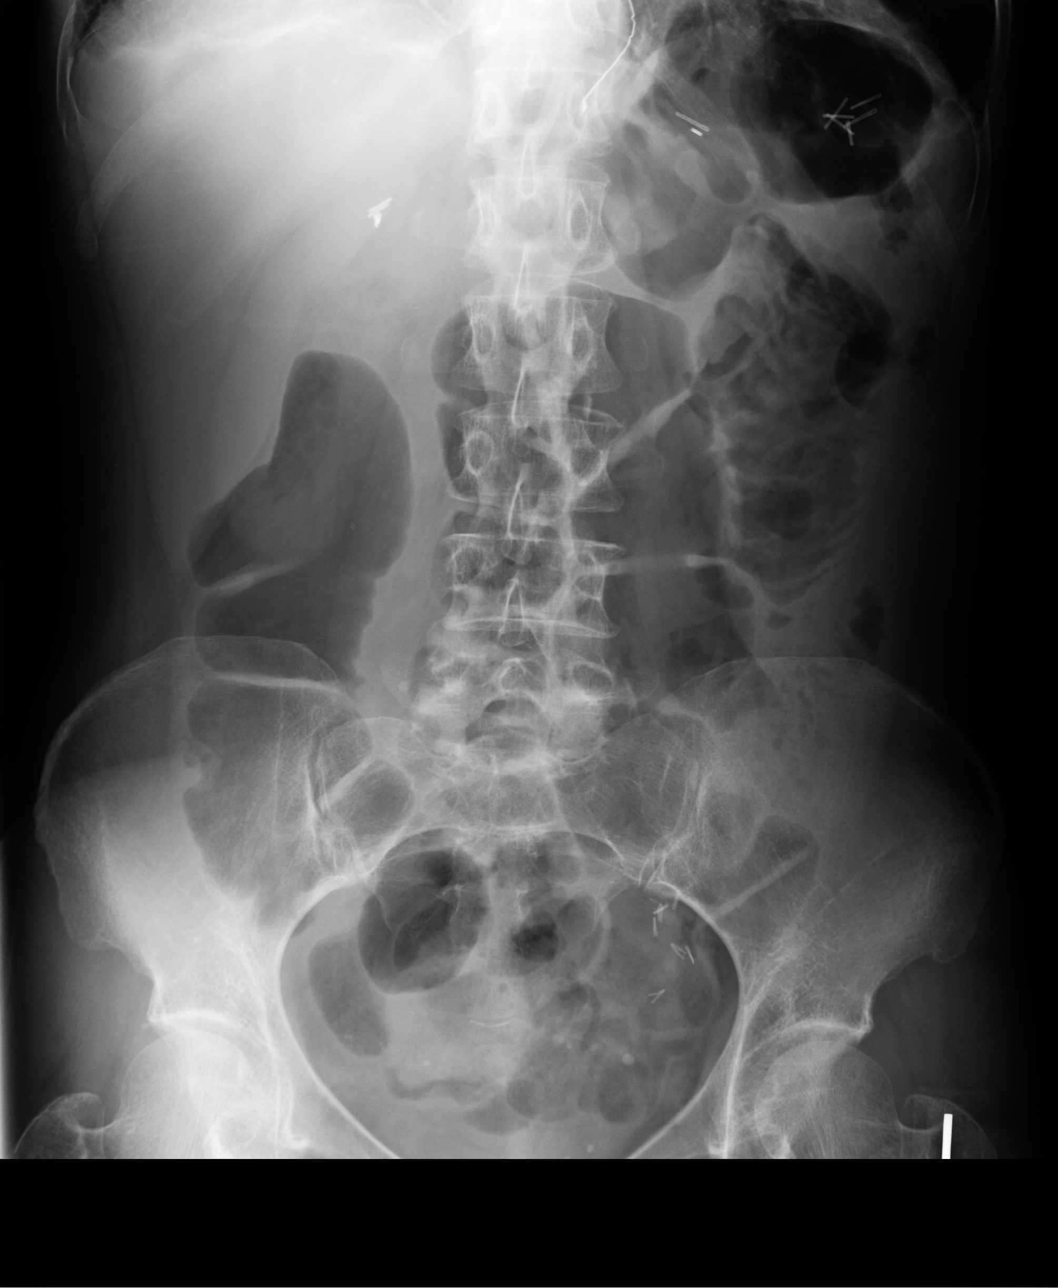
[im 4/4]
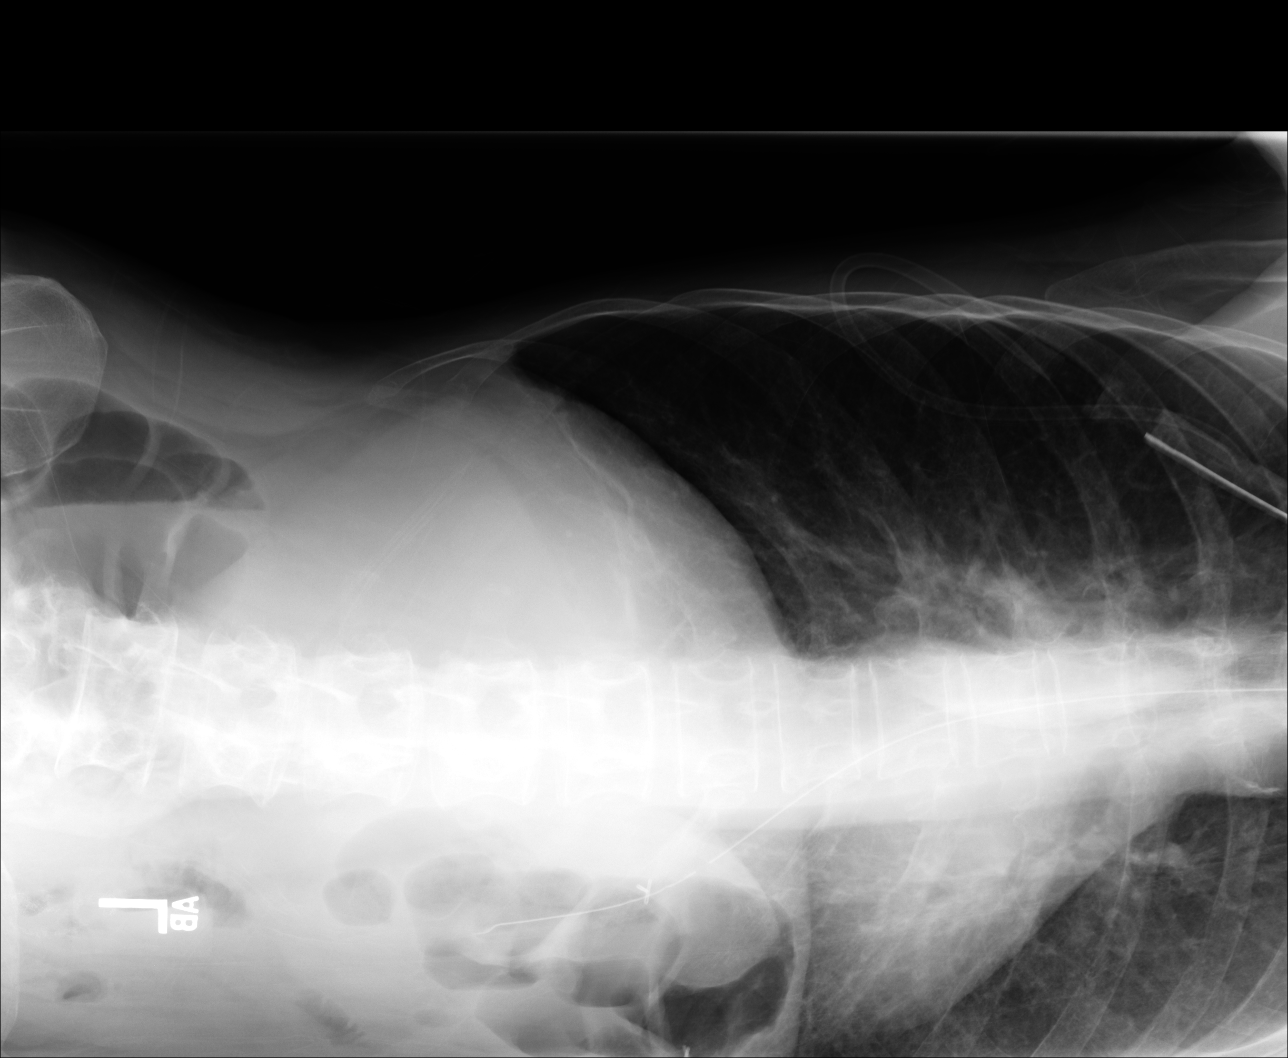

[4 of 4 positions shown; findings below may reference images not displayed]

PROCEDURE:     DXR - DXR ABDOMEN 3-WAY (INCL PA CXR)  - February 11, 2006  [DATE]

RESULT:       The tip of the NG tube pulled back somewhat with the tip in
the fundal area.  There is less distention of the colon as seen previously.
However, there is some mild distention as well as a few scattered air-fluid
levels and a few loops of small bowel in the midabdomen.  The accompanying
chest film reveals the lung fields to be clear.  The heart is normal in
size.
IMPRESSION: Less distention of the colon since previous exam.

## 2007-08-23 IMAGING — CR DG ABDOMEN 2V
1 series · 3 of 3 positions shown · non-contrast
Comparison: none

REASON FOR EXAM: Ileus
COMMENTS:

[Series 1: view not recorded · 0.17mm/px · 3 of 3 slices shown]
[im 1/3]
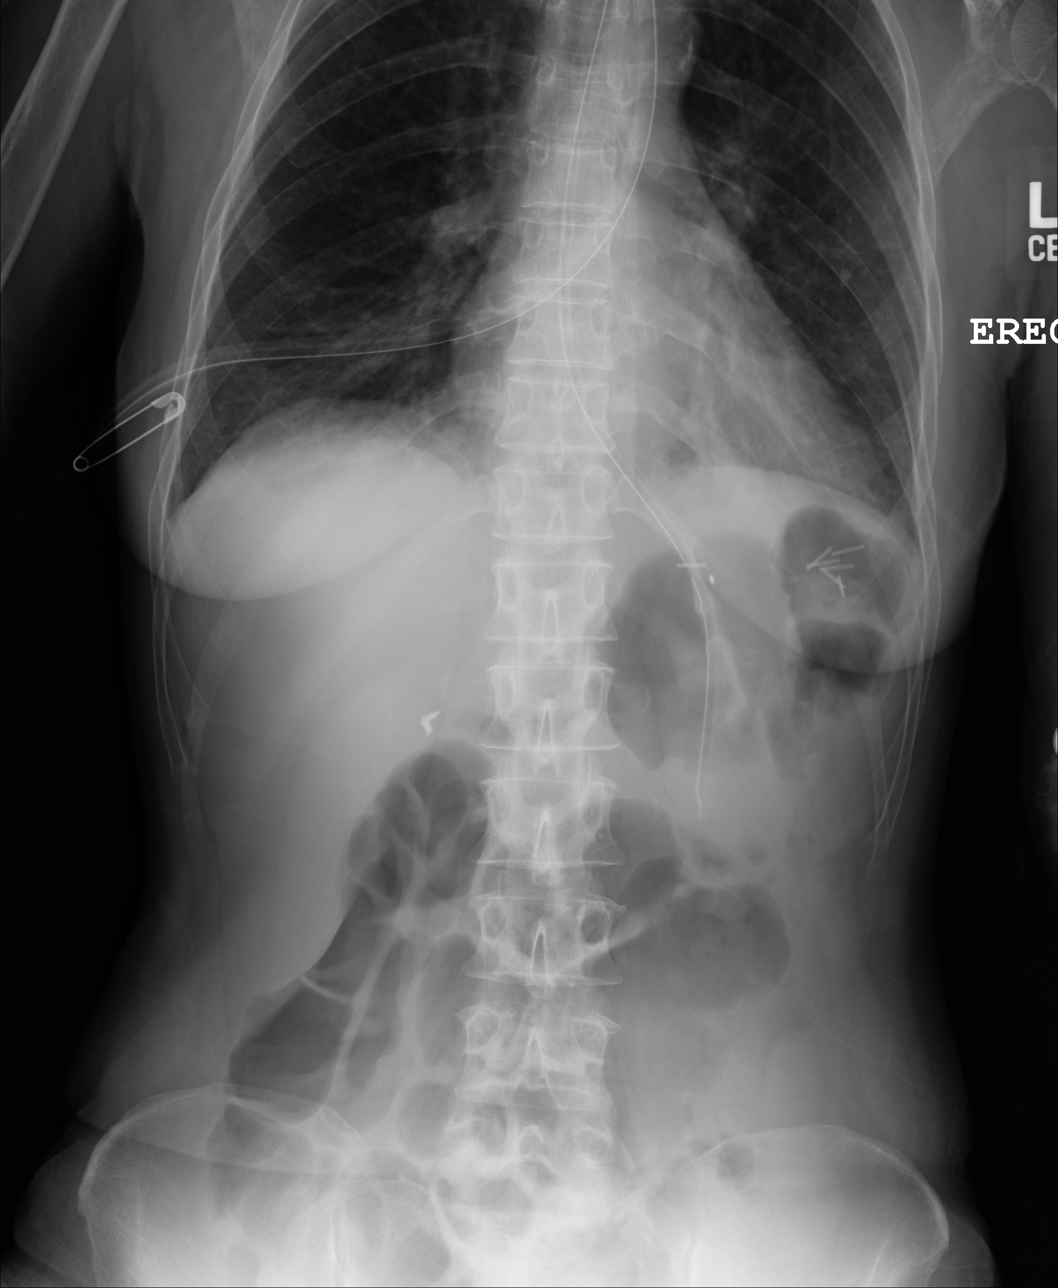
[im 2/3]
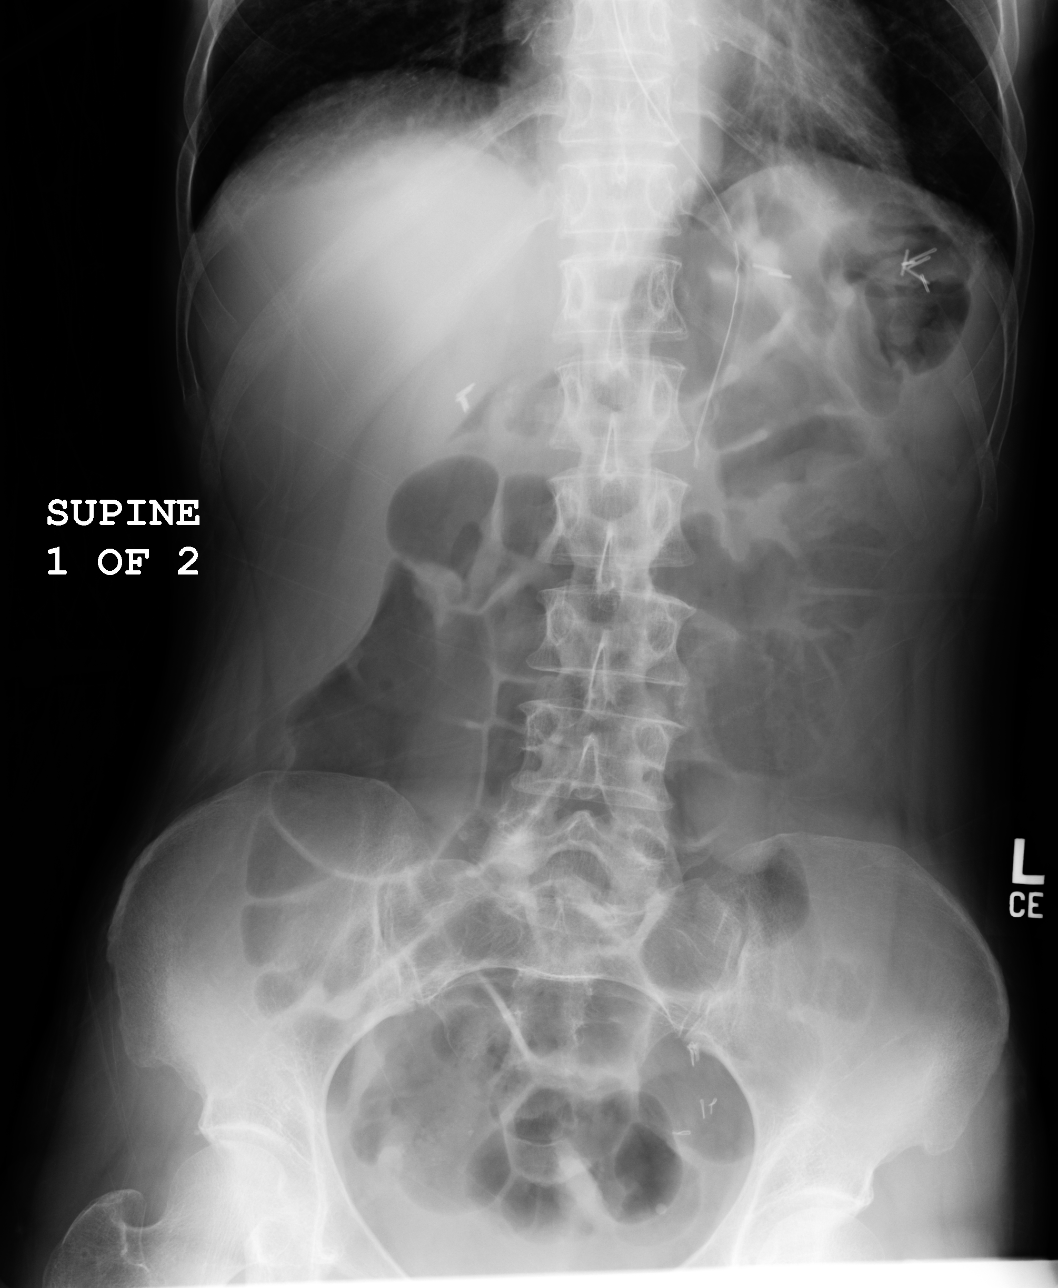
[im 3/3]
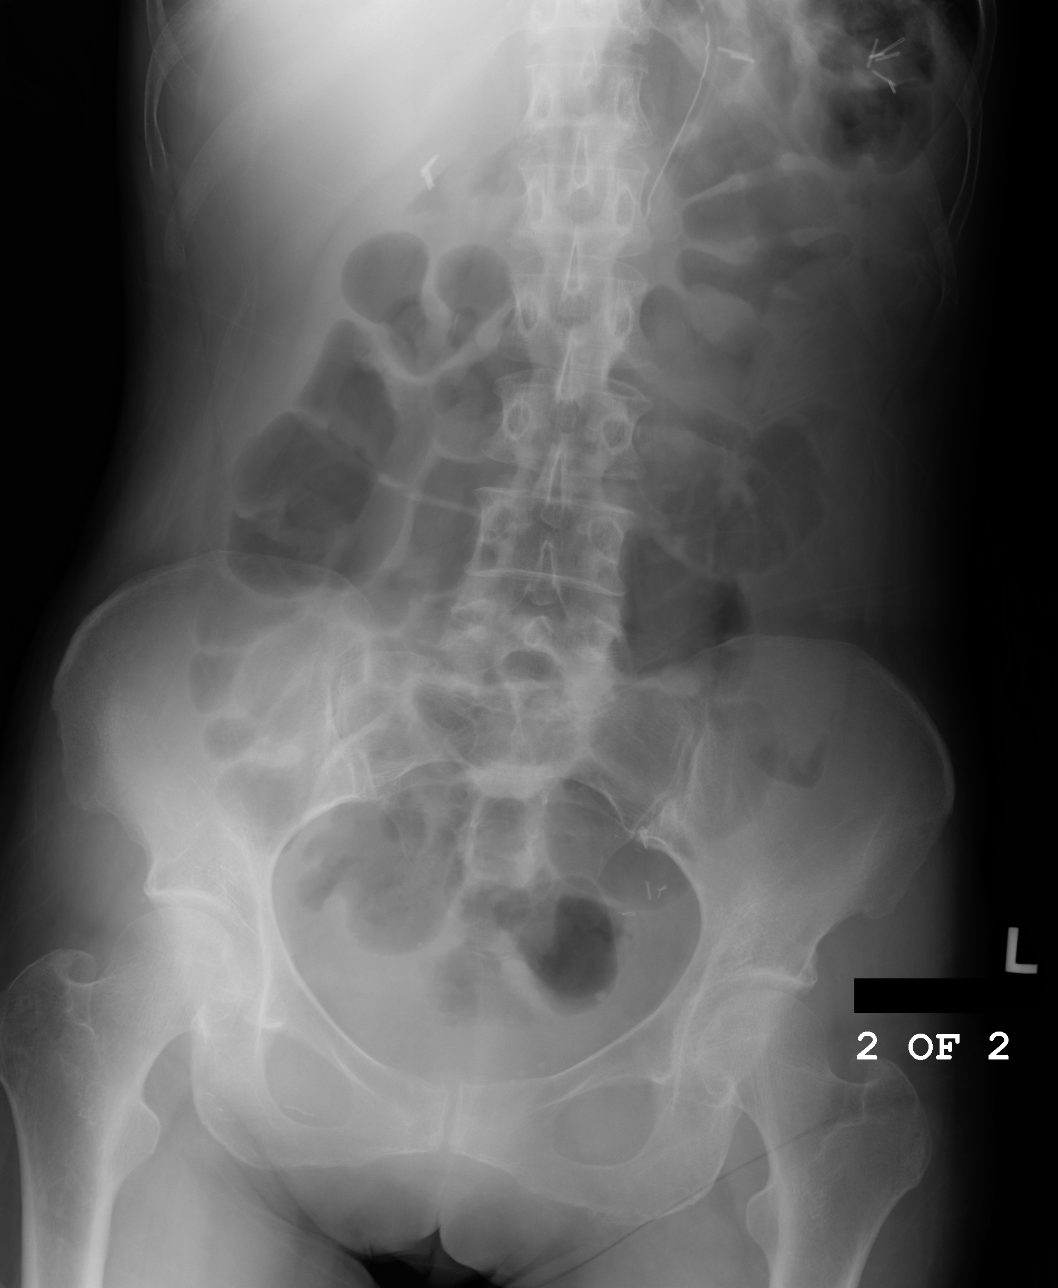

[3 of 3 positions shown; findings below may reference images not displayed]

PROCEDURE:     DXR - DXR ABDOMEN 2 V FLAT AND ERECT  - February 11, 2006  [DATE]

RESULT:     AP supine and erect view was obtained at [DATE] p.m. on 02/11/06 and
compared with the same day exam of 02/11/06 at [DATE] a.m.

The tip of the NG tube remains in the body of the stomach.  There is noted
some continued distention of the large and small bowel with slightly less
distention of the small bowel than seen previously.  Findings may represent
a diffuse ileus.  Surgical clips are noted in the LEFT upper quadrant as
well as the RIGHT upper quadrant and within the LEFT pelvis.
IMPRESSION: Gas pattern remains essentially unchanged.  There is air in both large and
small bowel that is mildly distended.  Findings may represent an ileus.  NG
tube remains in place.

## 2007-08-26 IMAGING — CR DG ABDOMEN 2V
1 series · 2 of 2 positions shown · non-contrast
Comparison: none

REASON FOR EXAM: follow up of ileus
COMMENTS:

[Series 1: view not recorded · 0.17mm/px · 2 of 2 slices shown]
[im 1/2]
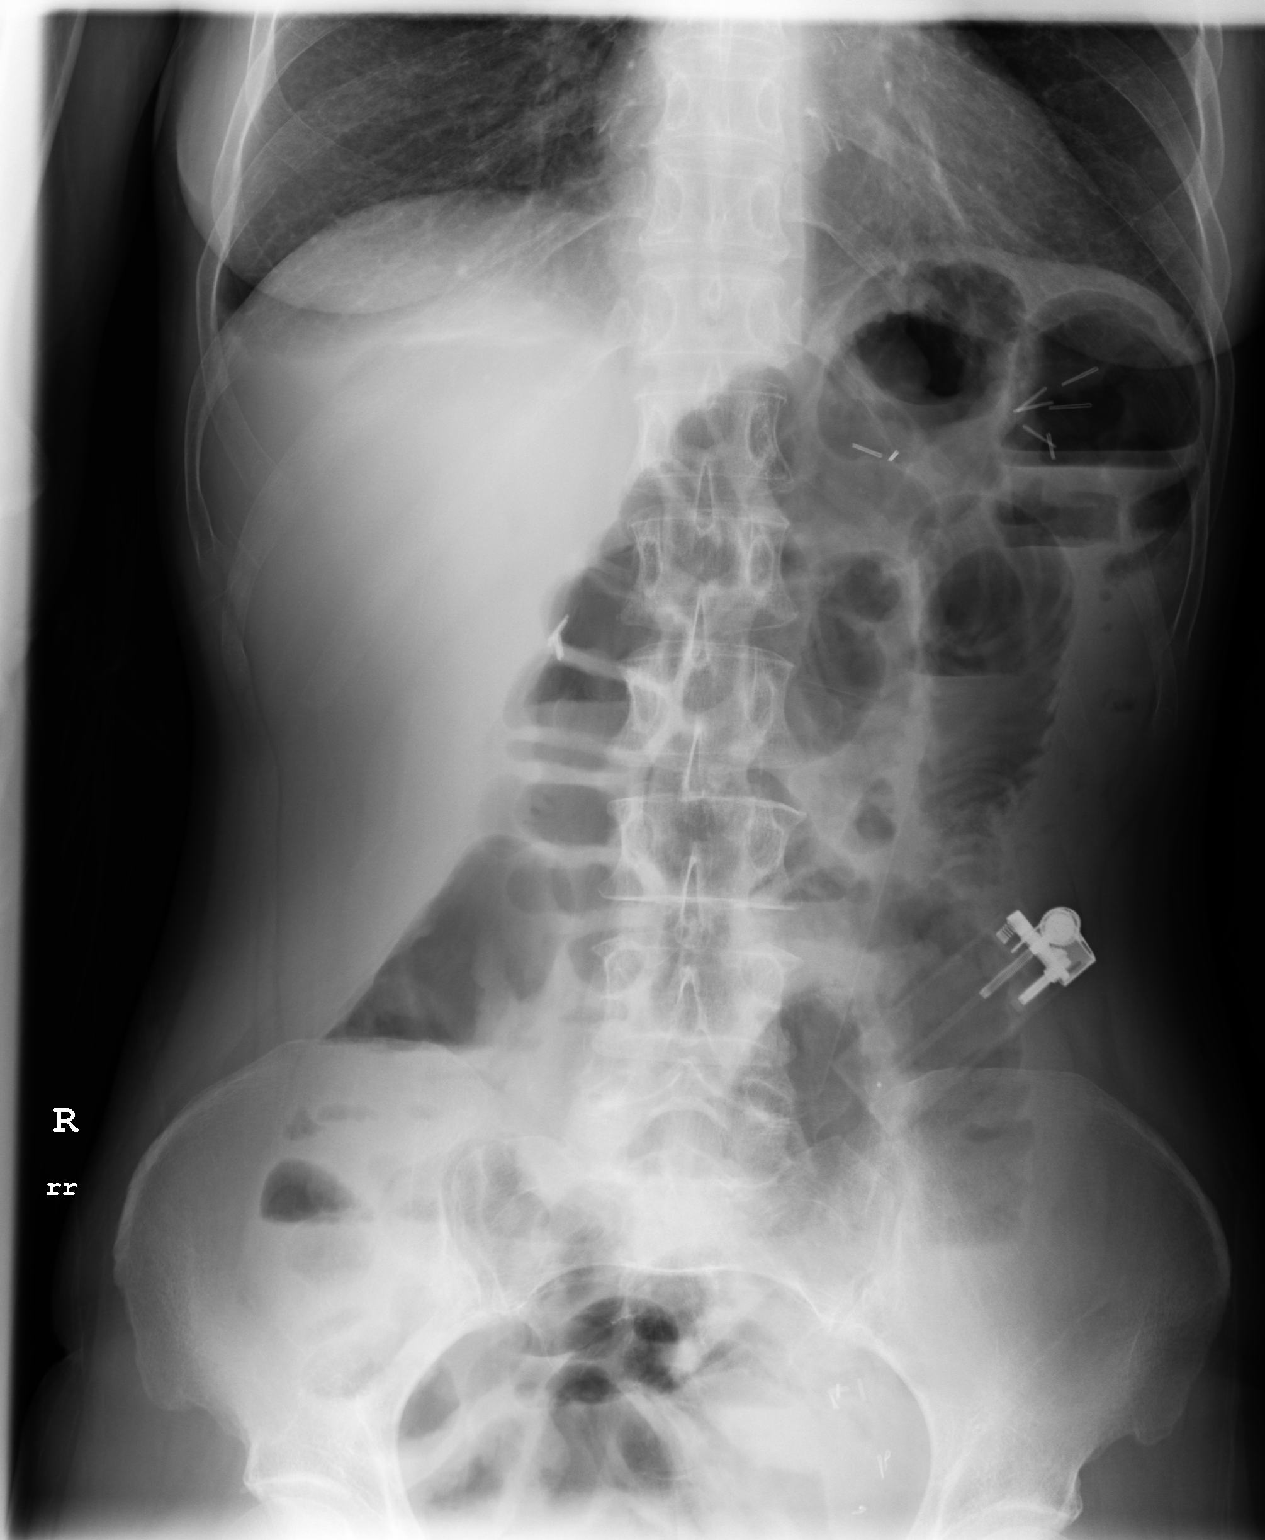
[im 2/2]
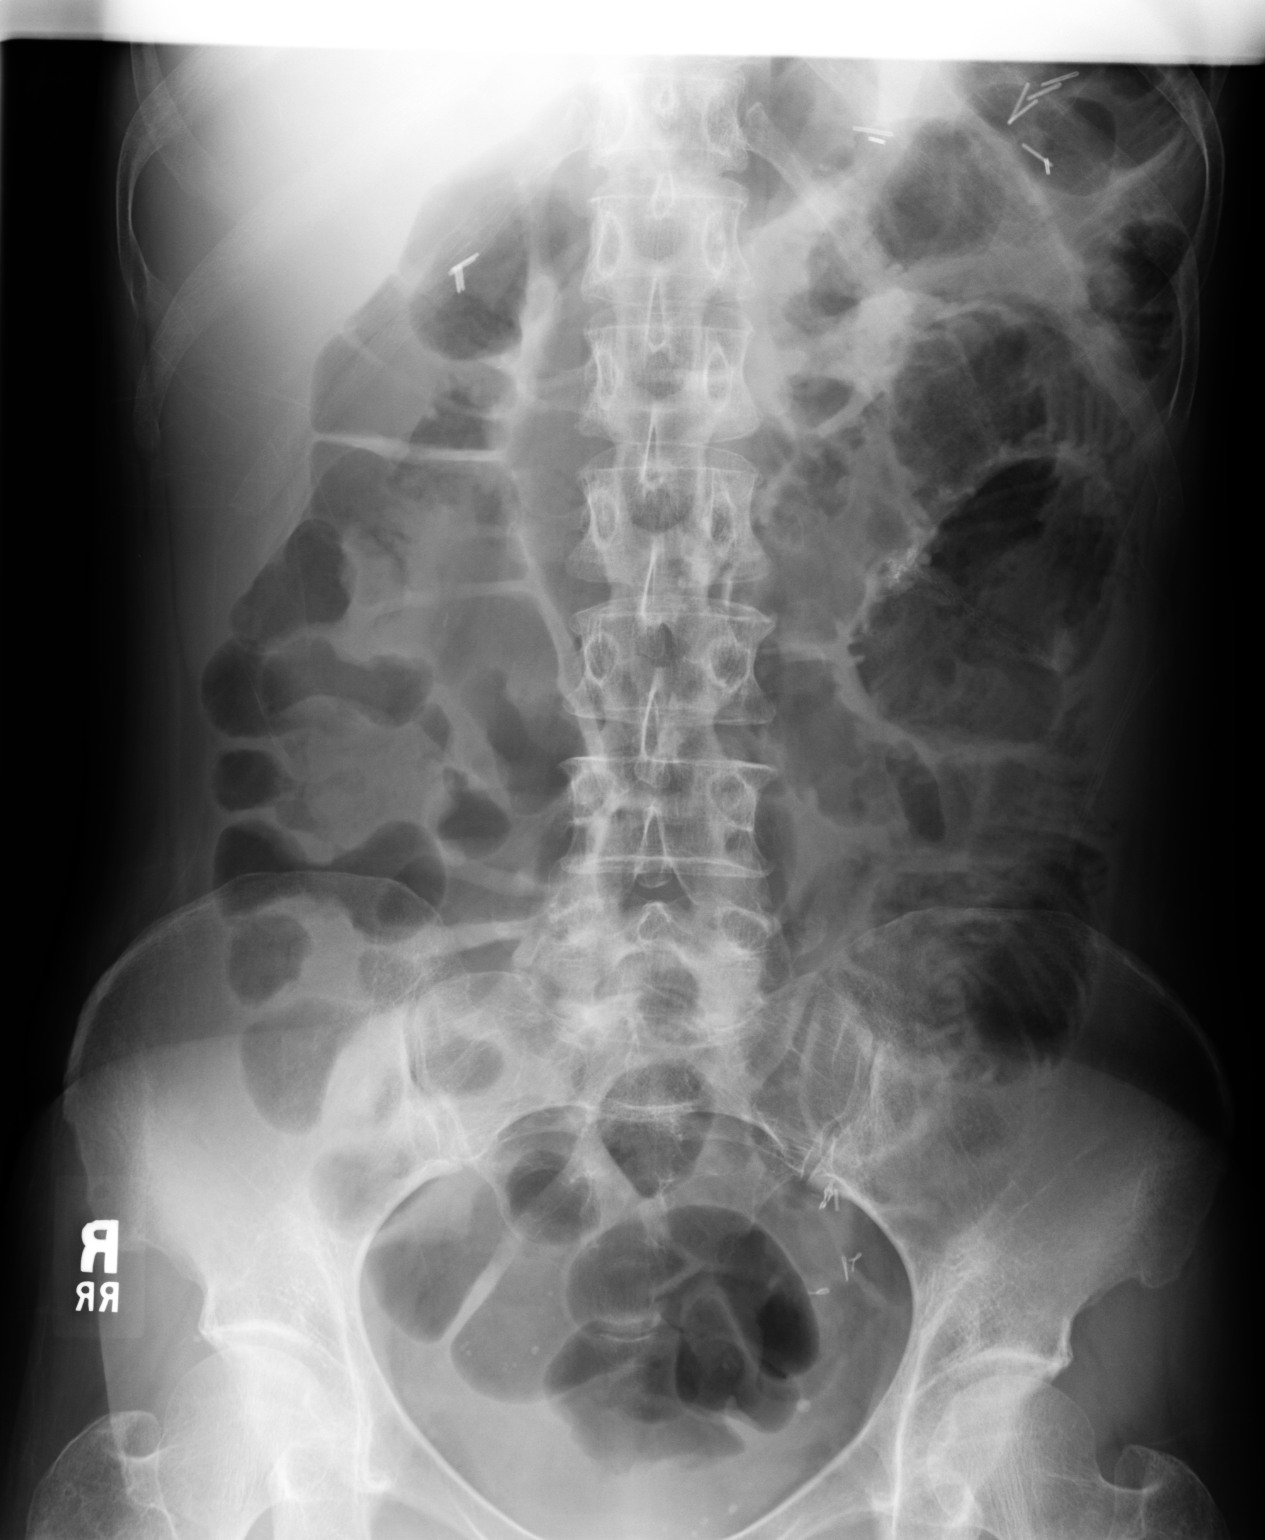

[2 of 2 positions shown; findings below may reference images not displayed]

PROCEDURE:     DXR - DXR ABDOMEN 2 V FLAT AND ERECT  - February 14, 2006  [DATE]

RESULT:     Comparison is made to prior study dated 02-13-06.

Distended/dilated loops of small bowel are once again appreciated within the
abdomen.  Multiple air filled loops of large bowel are also appreciated.
There appears to be an increased amount of air within the bowel when
compared to the previous study.  Air is appreciated in the region of the
pelvis suggesting air within the rectosigmoid colon or at least the sigmoid
colon region.  There does not appear to be evidence of free air.
IMPRESSION: 1)Findings consistent with an ileus type pattern. When compared to the
previous study there has been increase in bowel gas as described above.

## 2007-10-12 IMAGING — CR DG ABDOMEN 3V
1 series · 4 of 4 positions shown · non-contrast
Comparison: none

REASON FOR EXAM: Abdominal pain. [HOSPITAL]
COMMENTS:

[Series 1: view not recorded · 0.17mm/px · 4 of 4 slices shown]
[im 1/4]
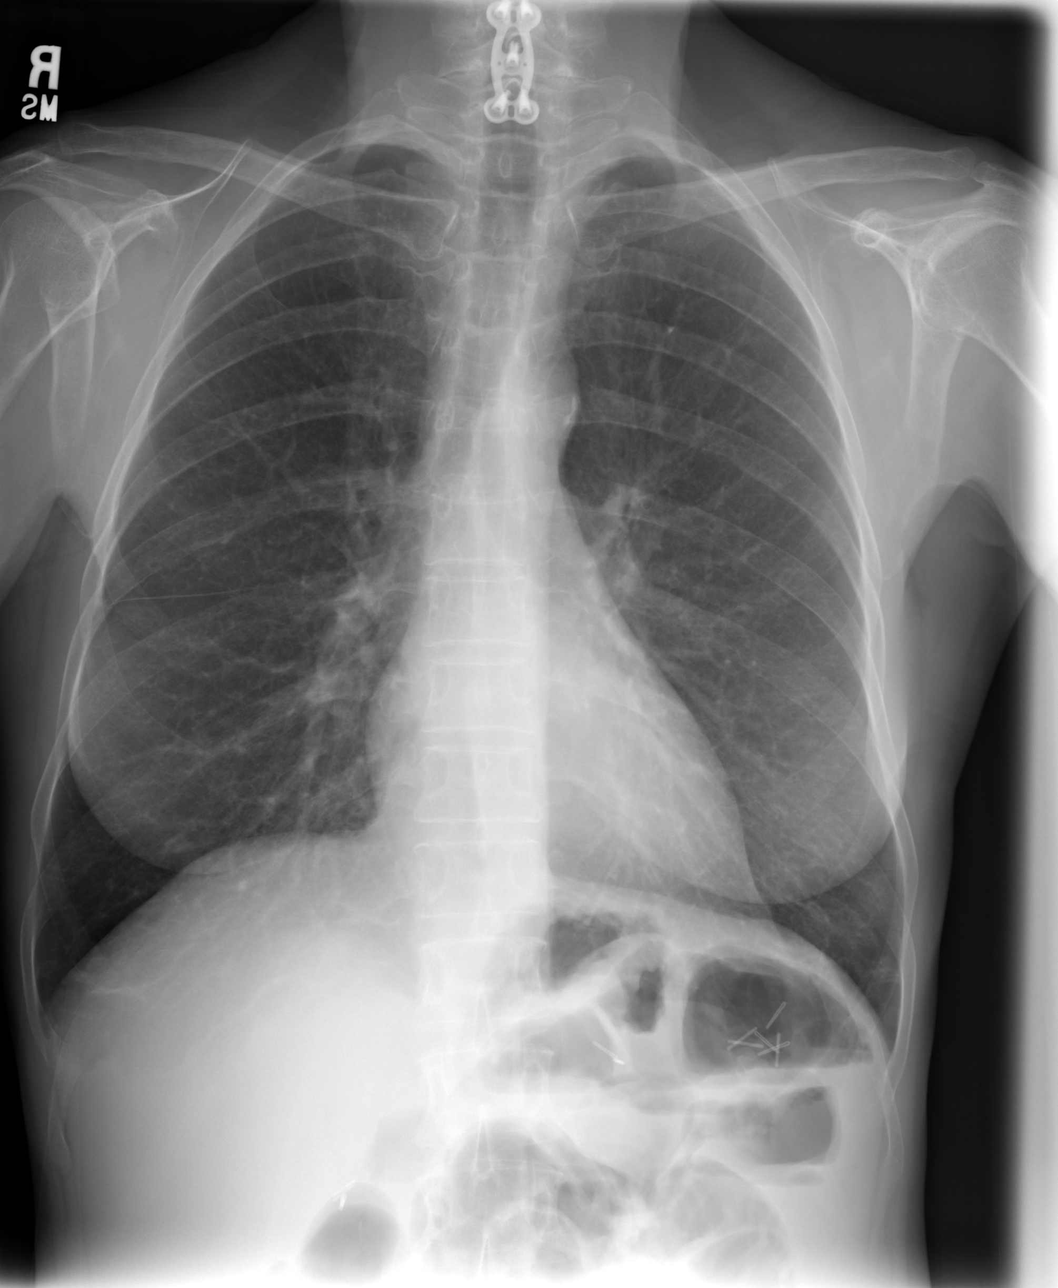
[im 2/4]
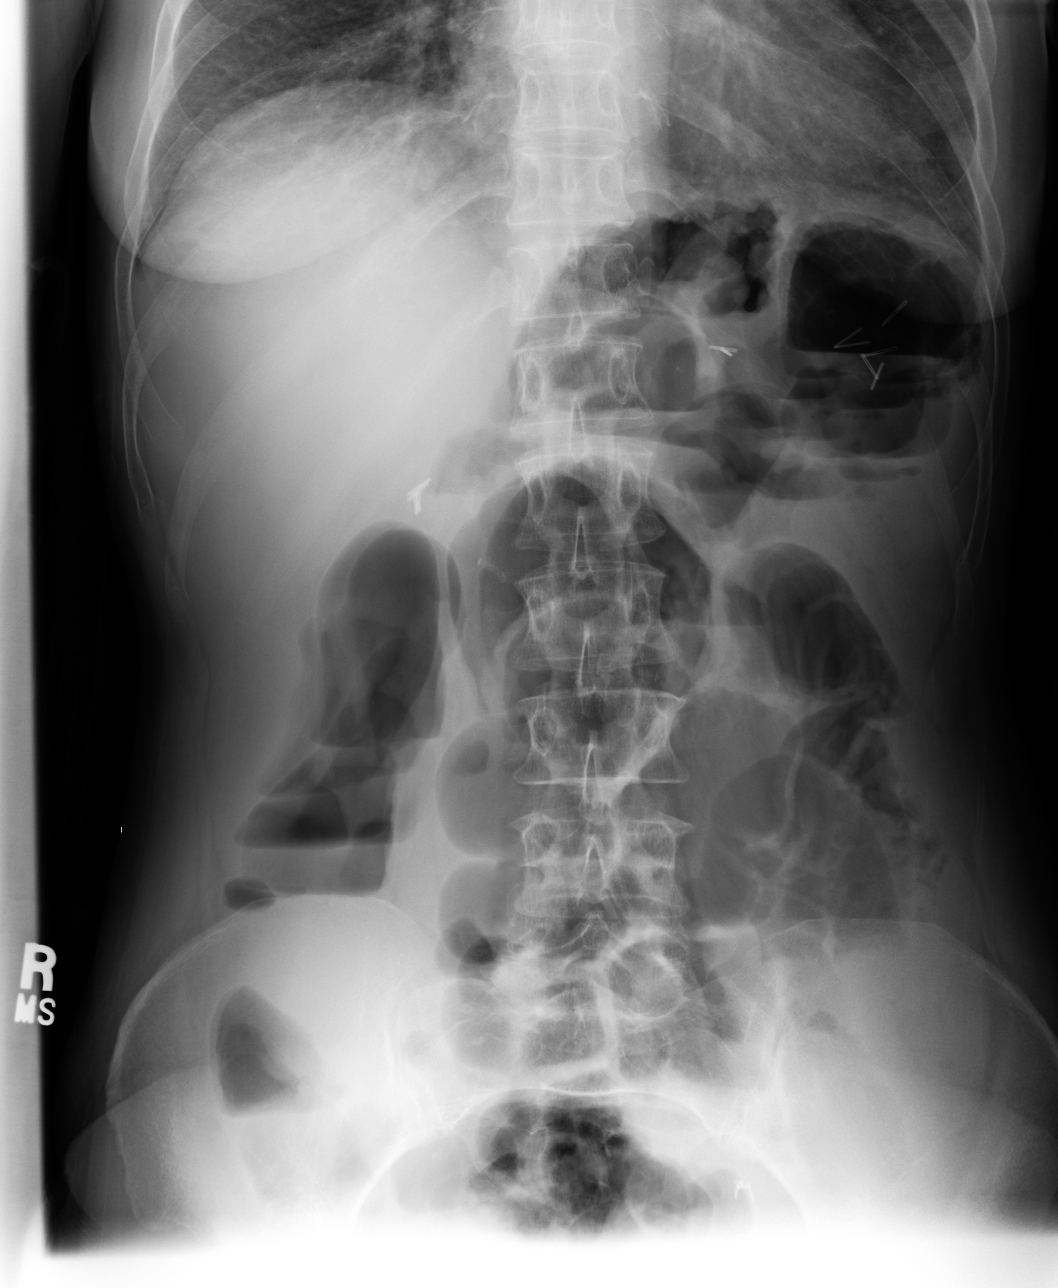
[im 3/4]
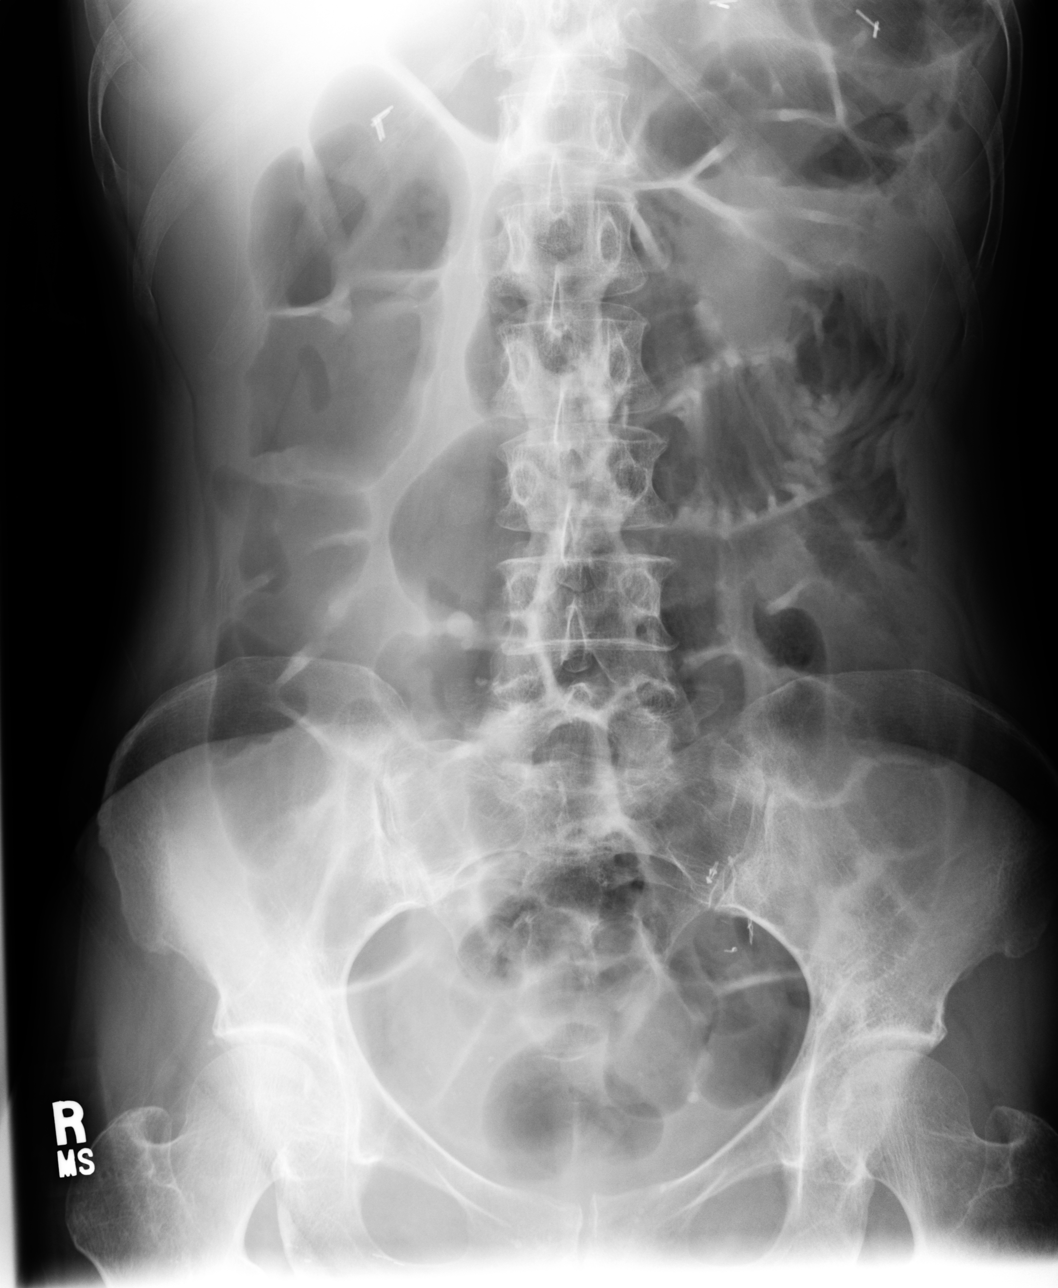
[im 4/4]
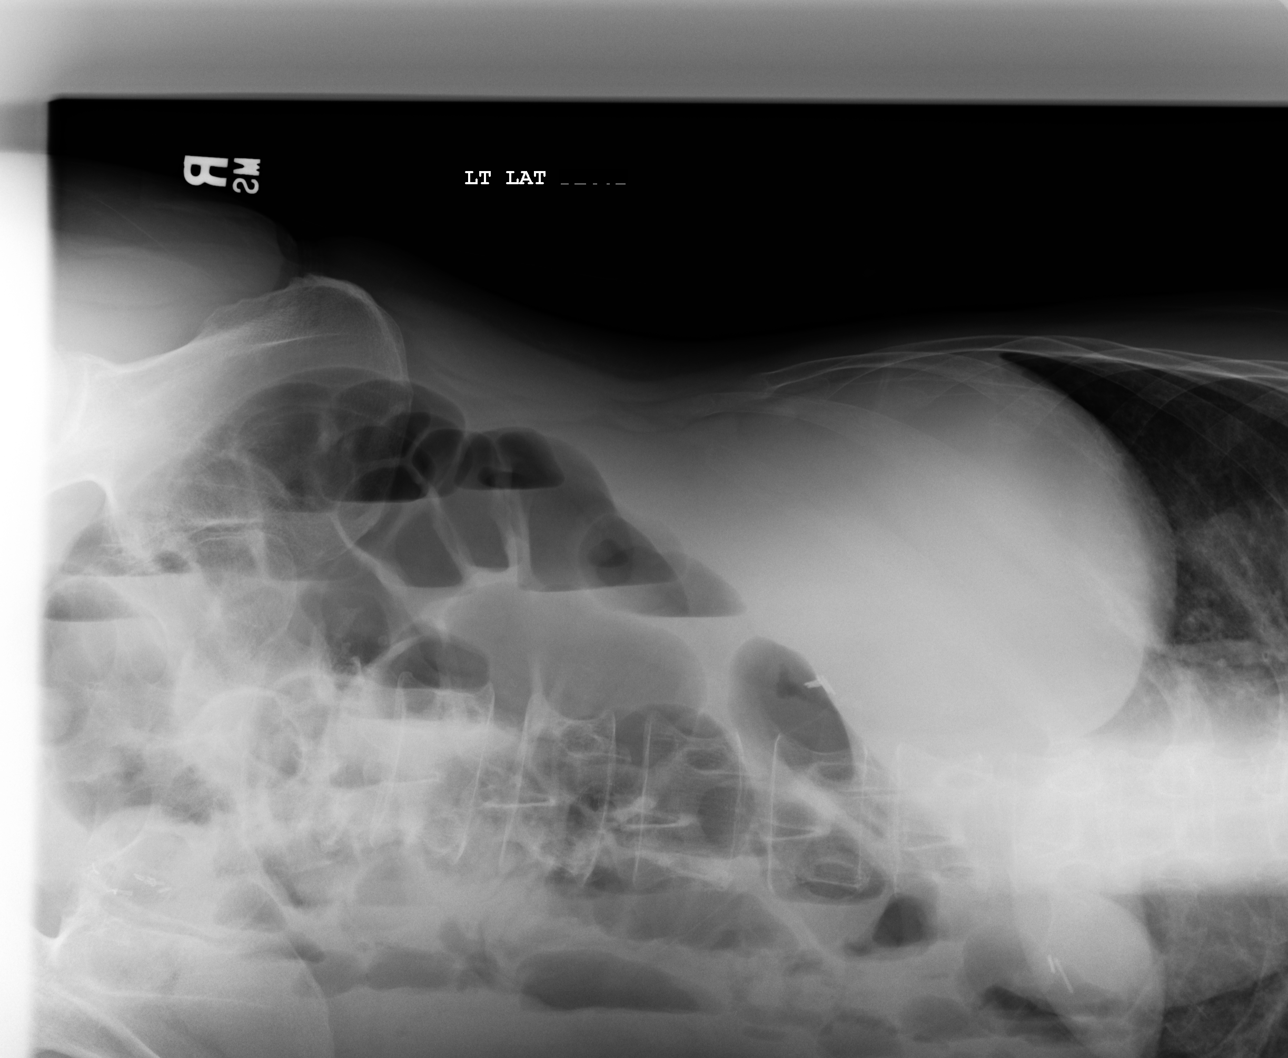

[4 of 4 positions shown; findings below may reference images not displayed]

PROCEDURE:     DXR - DXR ABDOMEN 3-WAY (INCL PA CXR)  - April 02, 2006 [DATE]

RESULT:     Single view of the chest demonstrates previous anterior cervical
fusion in the mid to lower cervical spine.  The lungs are hyperinflated
consistent with COPD.  There are some scattered air-fluid levels and some
mildly distended loops of small bowel seen.  There is air scattered through
the colon down to the rectum.  There is no definite free air demonstrated.
Surgical changes appear to be present in the LEFT pelvic region near the
LEFT sacroiliac joint.
IMPRESSION: Findings which can be consistent with ileus.

COPD.

Surgical clips are also noted in the LEFT upper quadrant and in the
gallbladder fossa region.

## 2007-10-15 IMAGING — CR DG ABDOMEN 2V
1 series · 3 of 3 positions shown · non-contrast
Comparison: none

REASON FOR EXAM: ileus
COMMENTS:

[Series 1: view not recorded · 0.17mm/px · 3 of 3 slices shown]
[im 1/3]
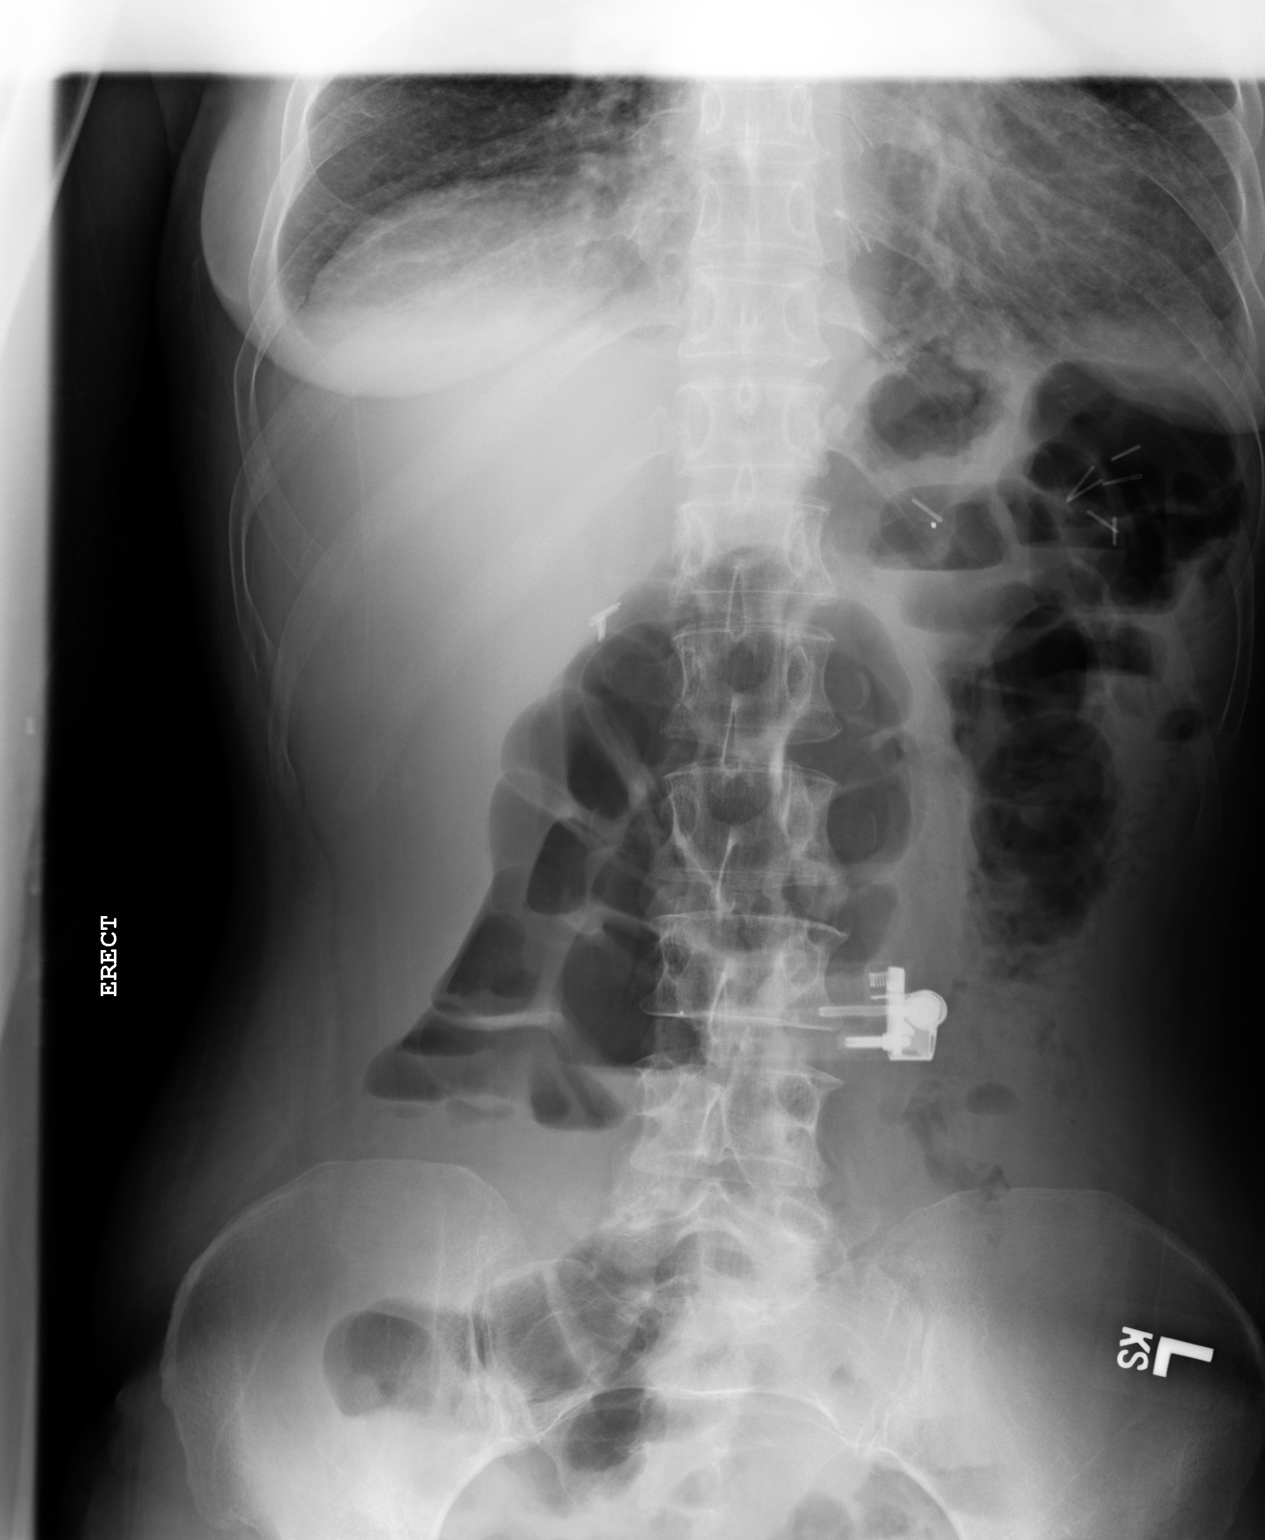
[im 2/3]
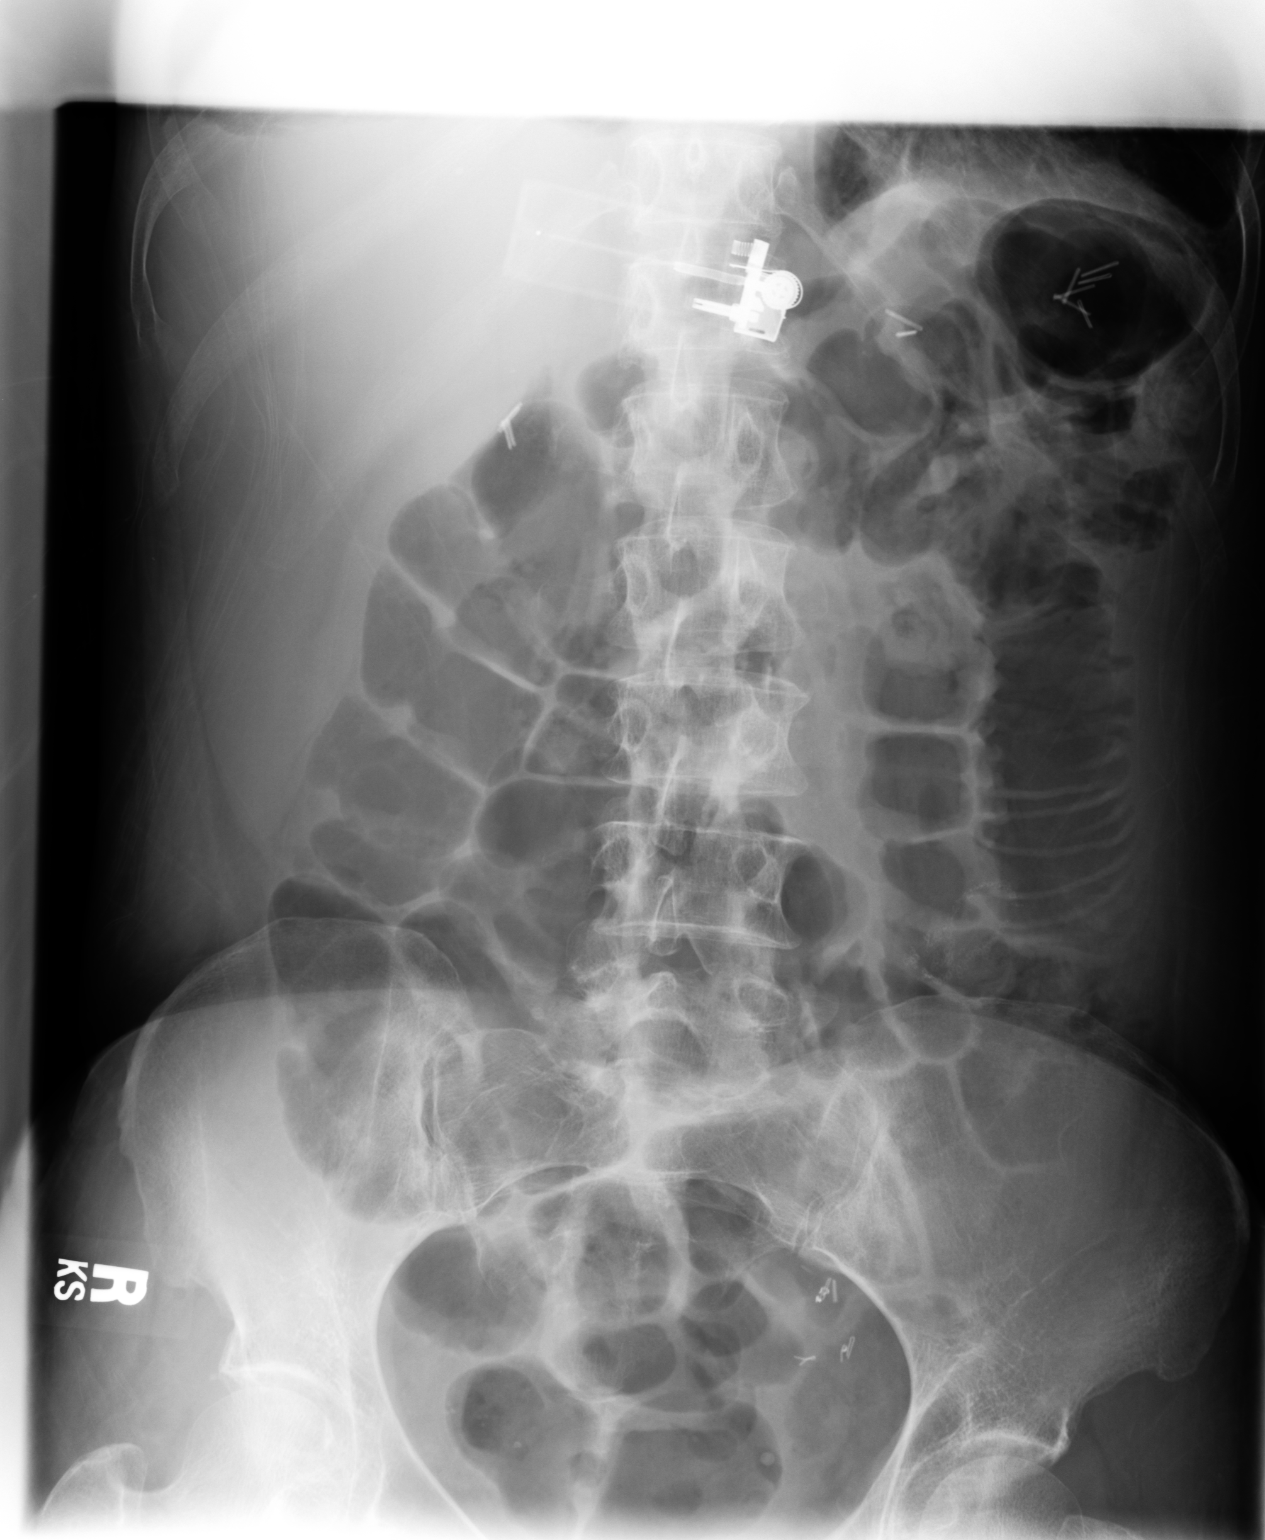
[im 3/3]
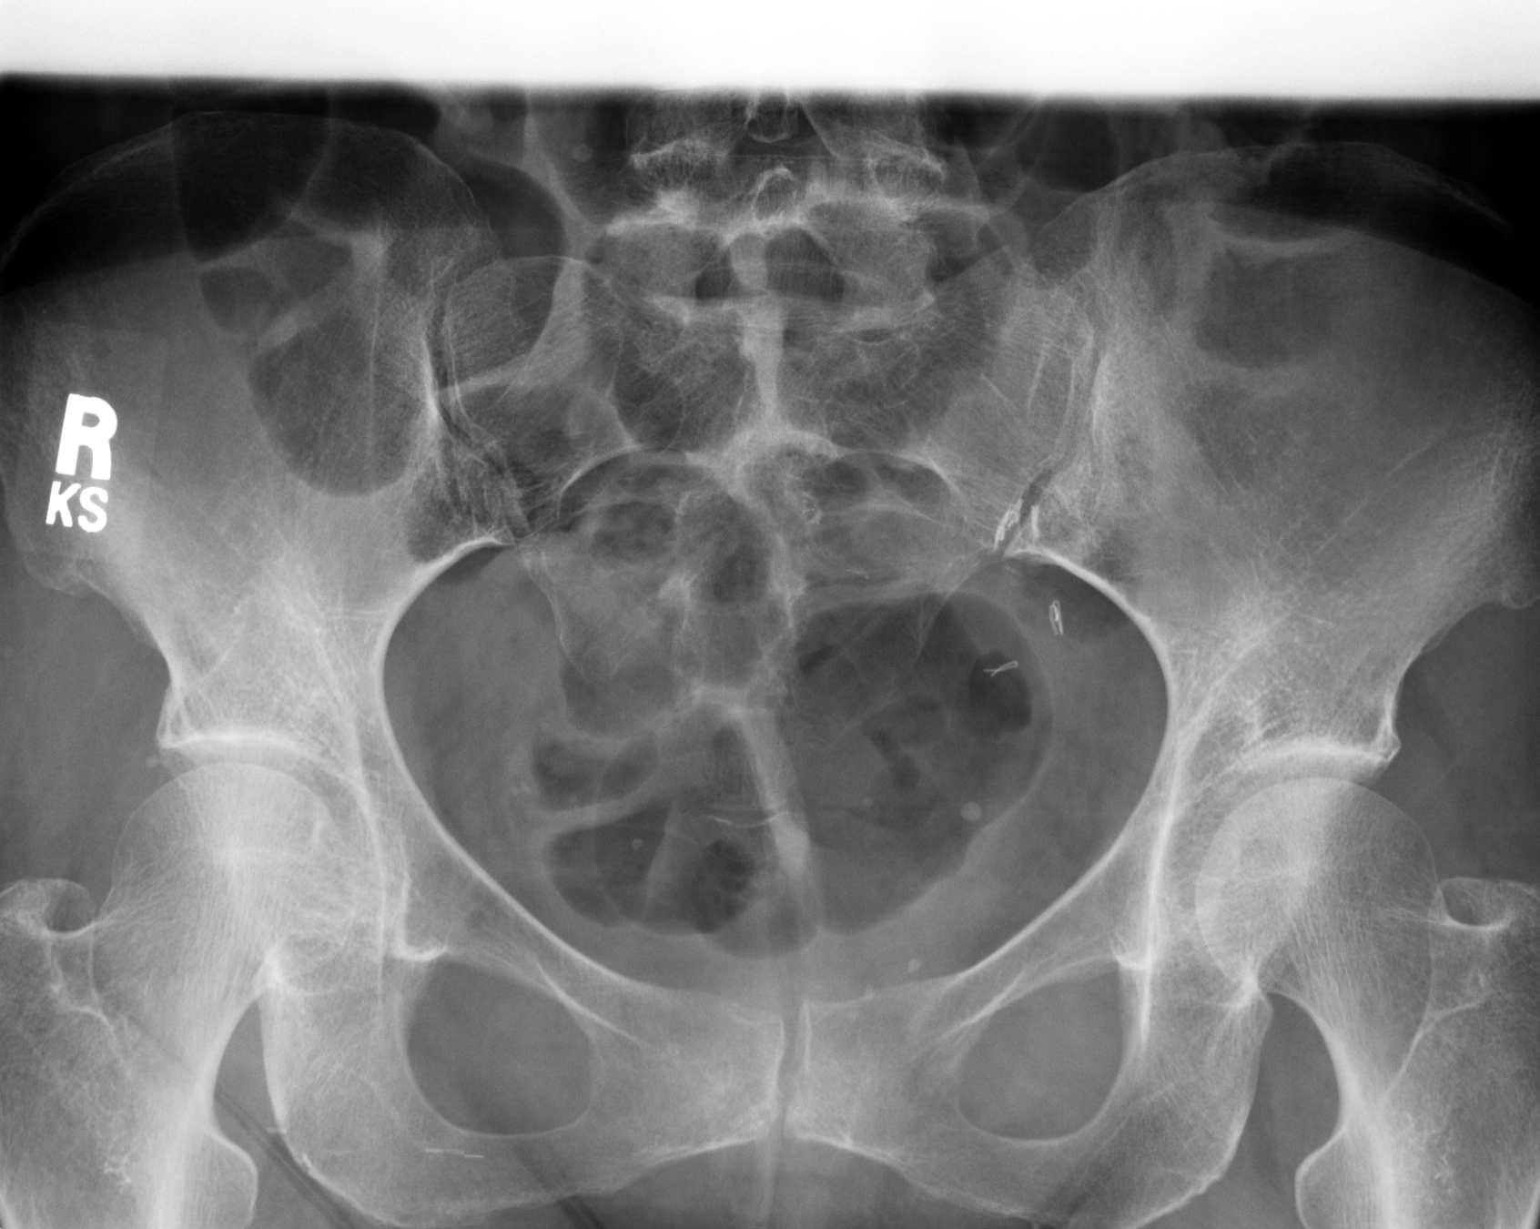

[3 of 3 positions shown; findings below may reference images not displayed]

PROCEDURE:     DXR - DXR ABDOMEN 2 V FLAT AND ERECT  - April 05, 2006  [DATE]

RESULT:          AP supine and erect views were obtained and compared to the
prior study of 04/02/2006.

There is noted distention of large and small bowel. Some air-fluid levels
are  noted on the upright film, not as numerous as seen on 04/02/2006.  Noted
are multiple pelvic phleboliths.  Surgical clips are noted in the LEFT upper
quadrant and RIGHT upper quadrant.  No free intraperitoneal air.
IMPRESSION: Findings which can be compatible with diffuse ileus.

## 2007-10-17 IMAGING — RF DG SMALL BOWEL
2 series · 10 of 10 positions shown · non-contrast
Comparison: none

REASON FOR EXAM: Crohns disease, partial SBO
COMMENTS:

[Series 1: view not recorded · 0.17mm/px · 3 of 3 slices shown]
[im 1/3]
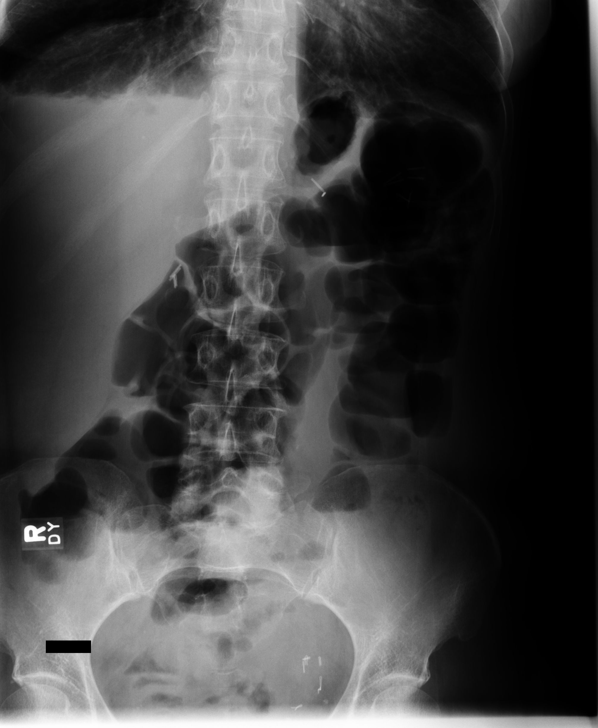
[im 2/3]
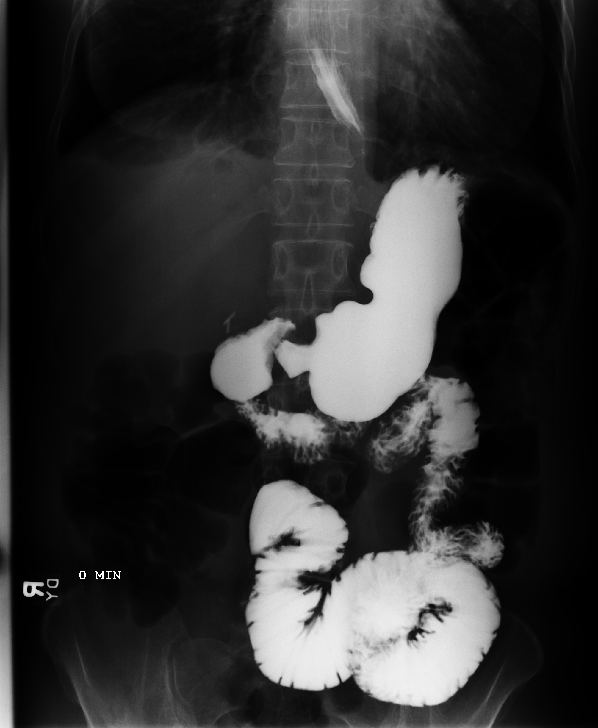
[im 3/3]
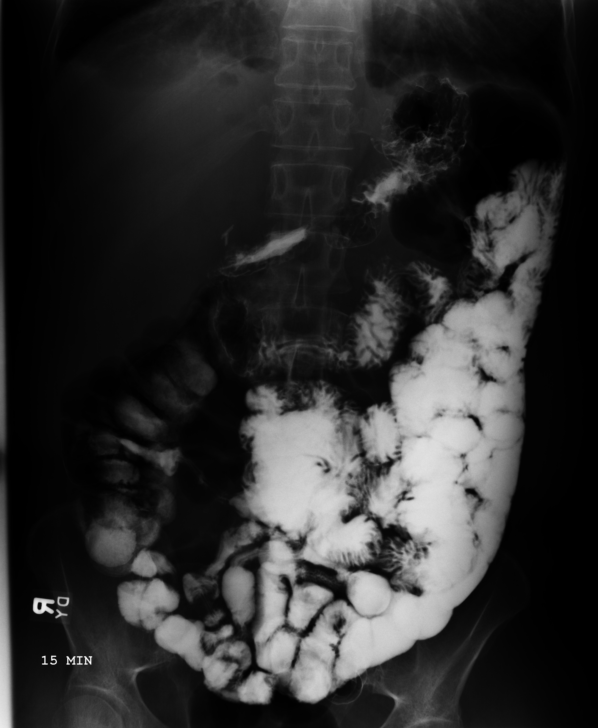

[Series 1: run · 7 of 7 slices shown]
[im 1/7]
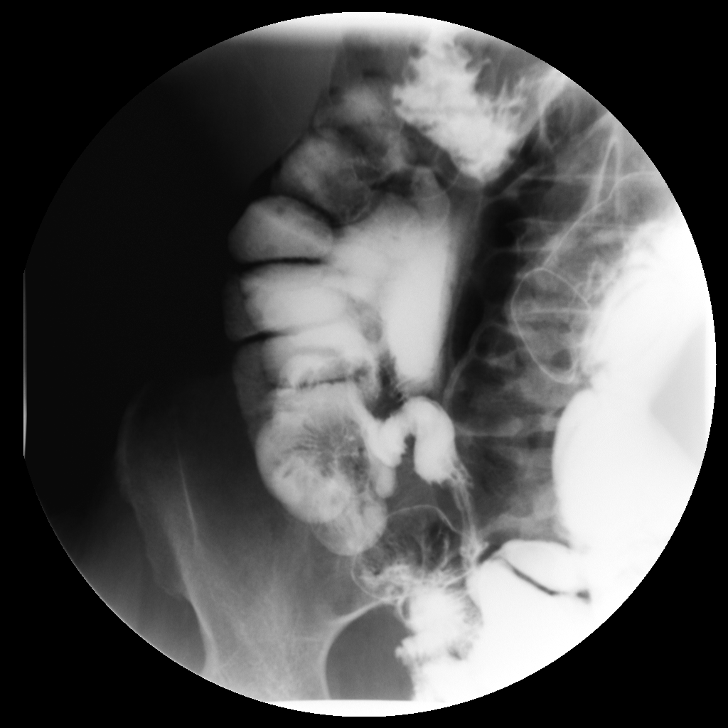
[im 2/7]
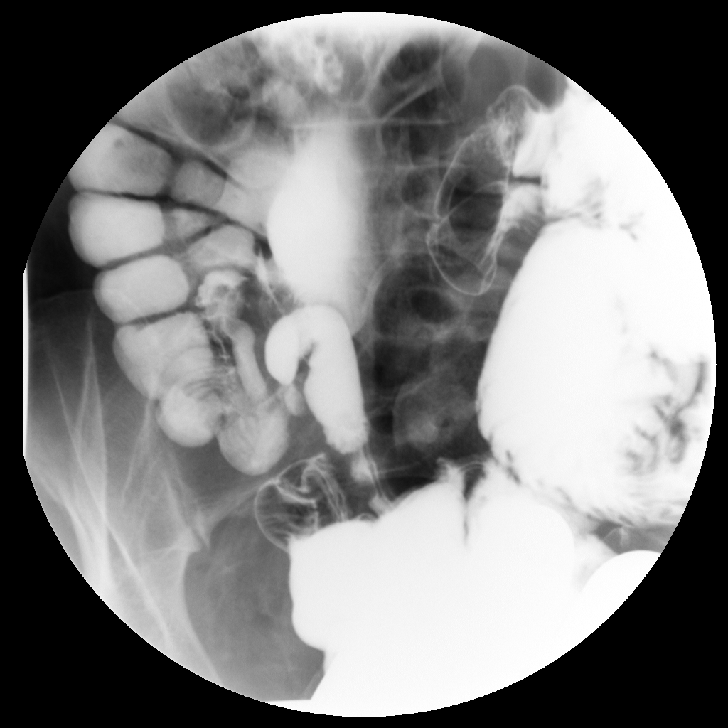
[im 3/7]
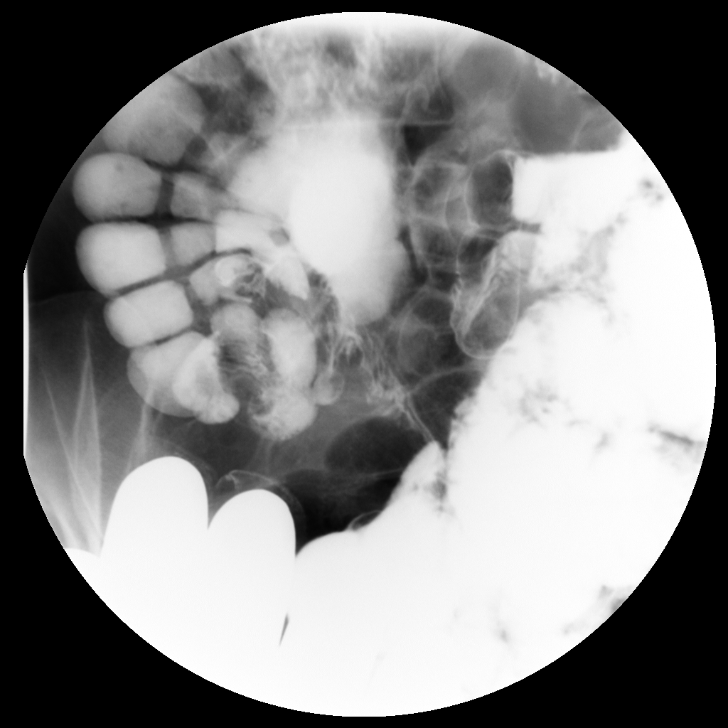
[im 4/7]
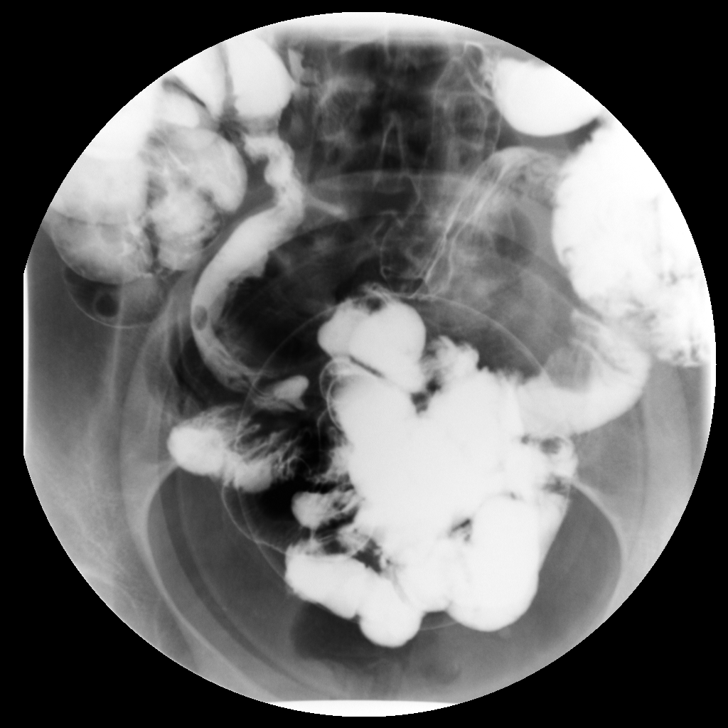
[im 5/7]
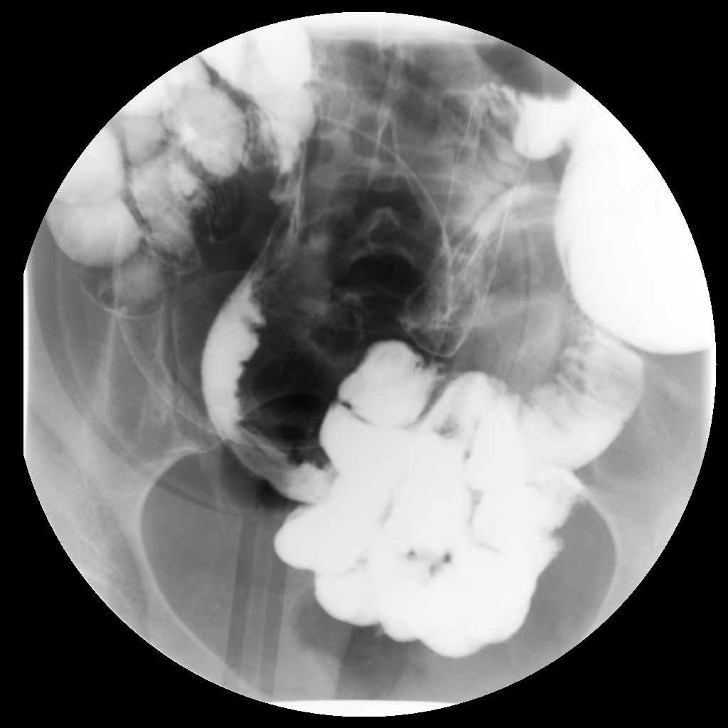
[im 6/7]
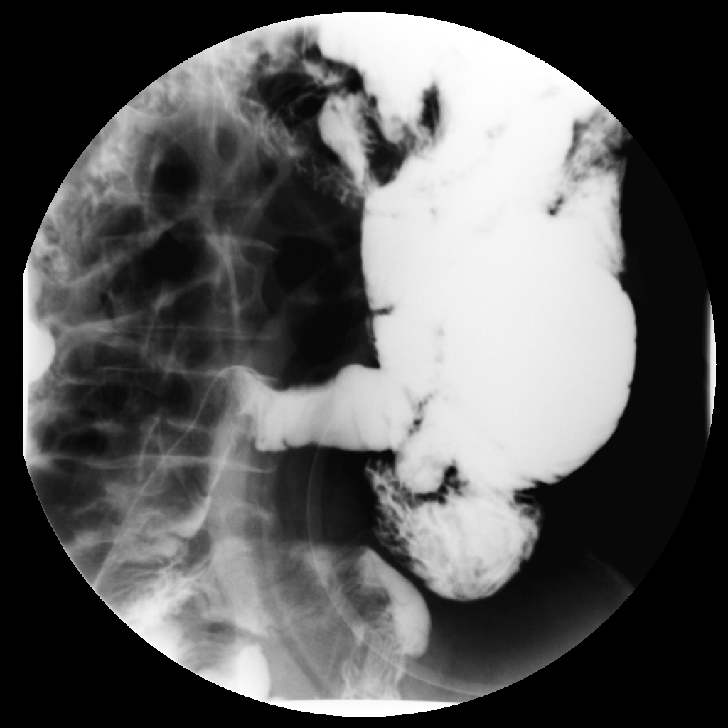
[im 7/7]
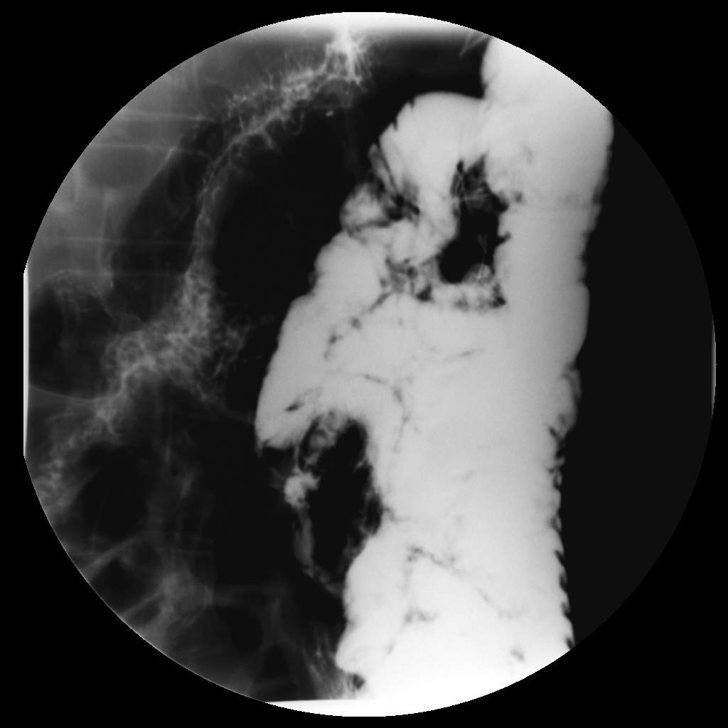

[10 of 10 positions shown; findings below may reference images not displayed]

PROCEDURE:     FL  - FL SMALL BOWEL  - April 07, 2006  [DATE]

RESULT:     Barium passed through the small bowel in 15 minutes.  The
terminal ileum is isolated and appears within normal limits.  No narrowing
is identified. No thickening of the wall is seen.  No thickening of the
folds or distention of small bowel loops is noted.
IMPRESSION: 1)Small bowel series within normal limits.  No areas of narrowing are
identified.

## 2007-10-19 IMAGING — CR DG ABDOMEN 2V
1 series · 2 of 2 positions shown · non-contrast
Comparison: none

REASON FOR EXAM: Ileus
COMMENTS:

[Series 1: view not recorded · 0.17mm/px · 2 of 2 slices shown]
[im 1/2]
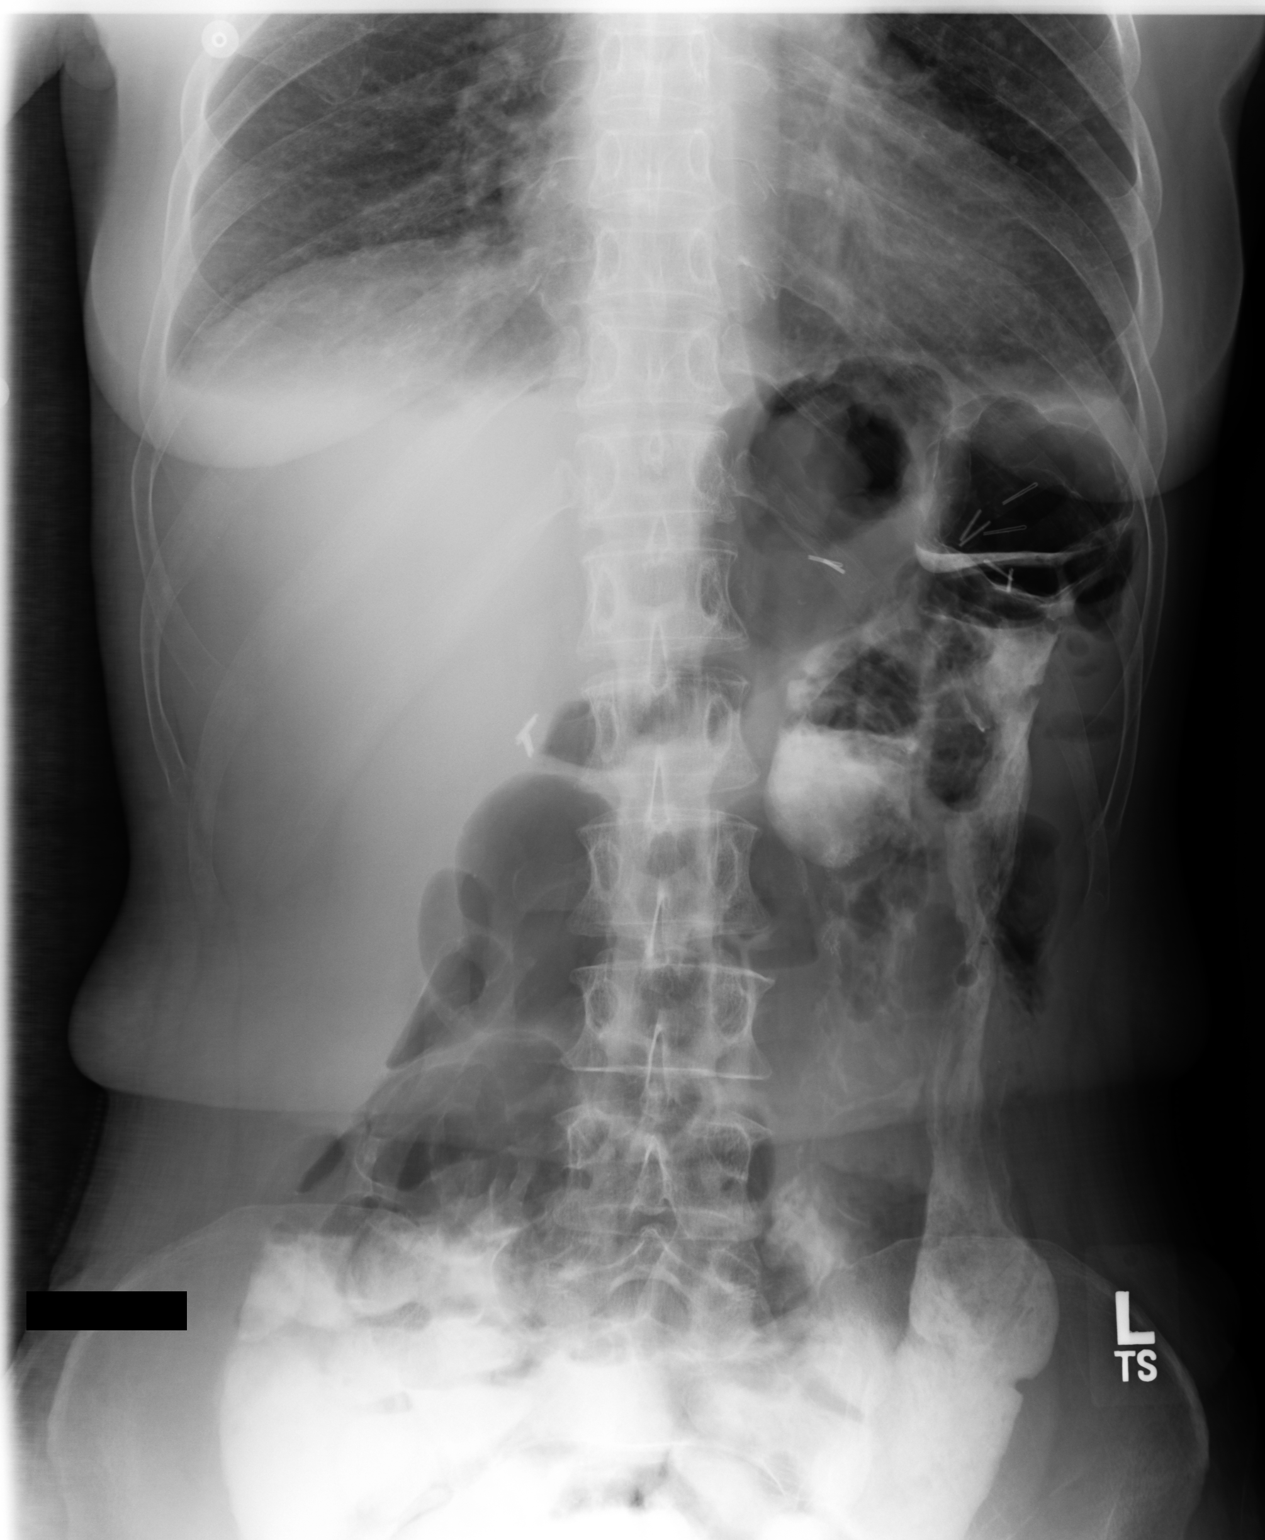
[im 2/2]
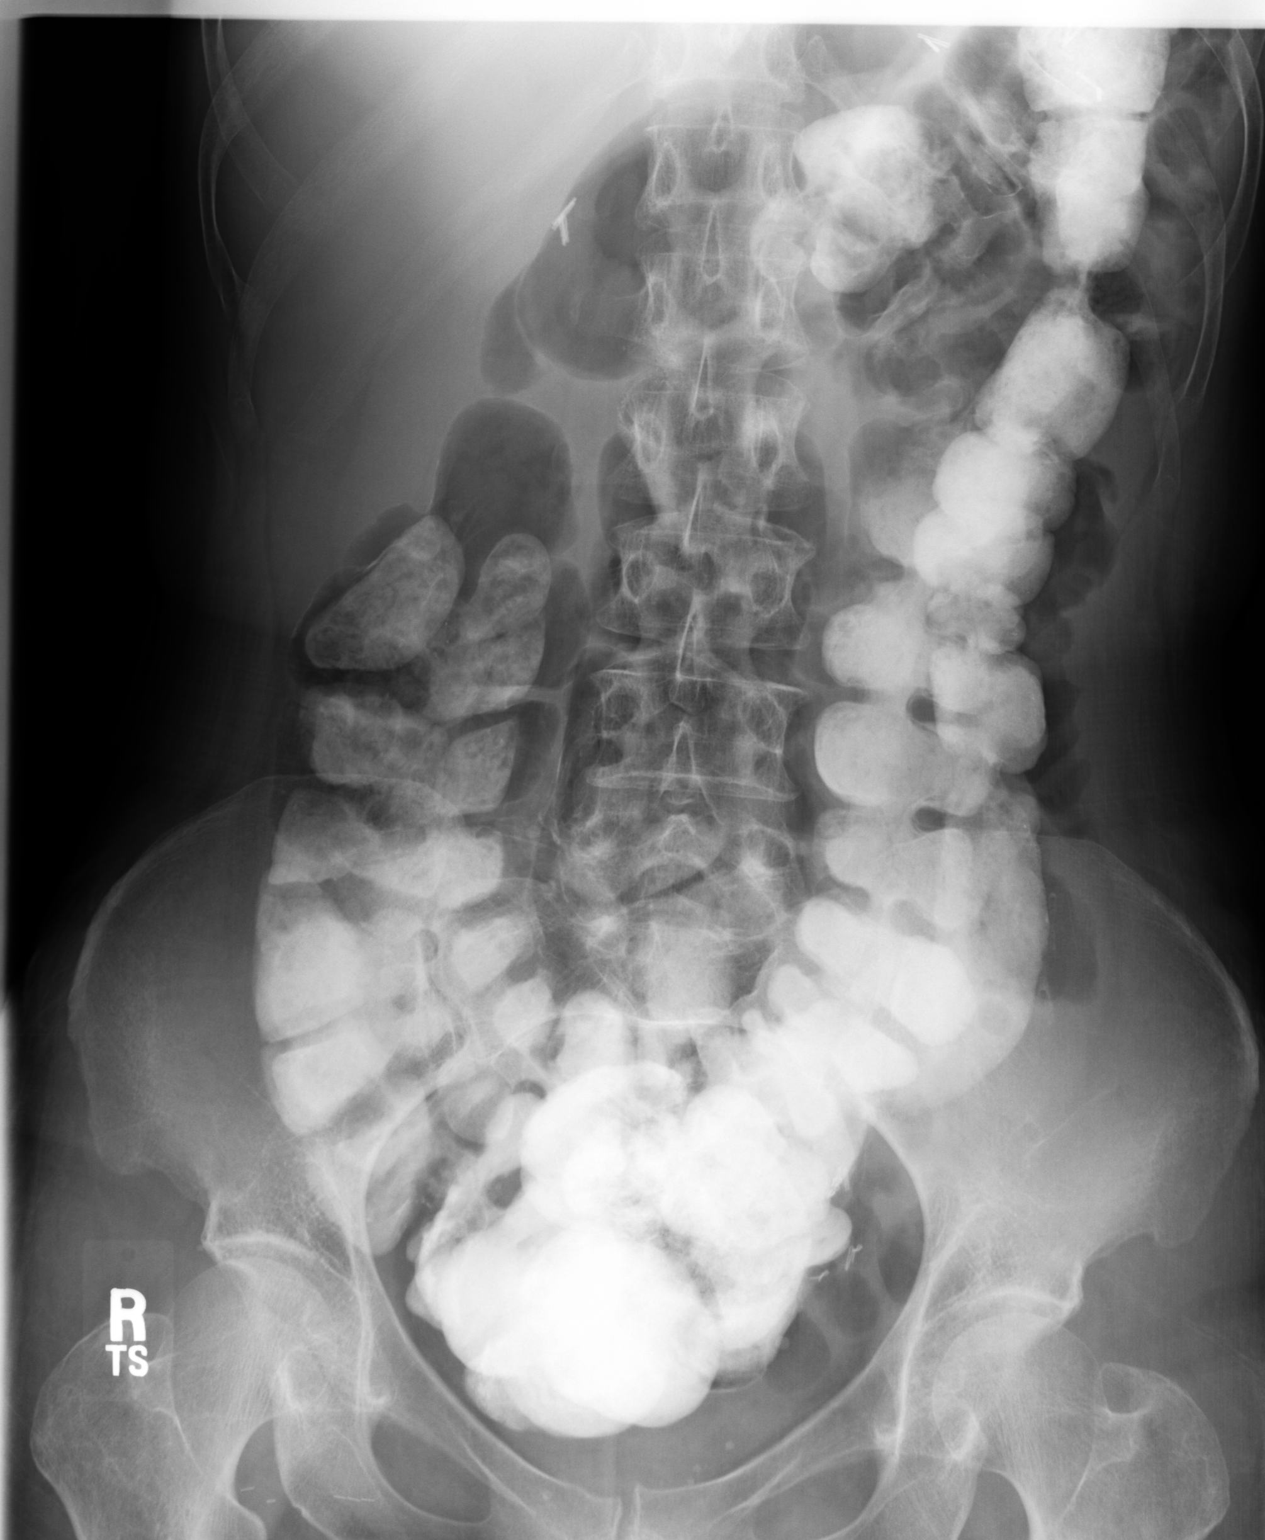

[2 of 2 positions shown; findings below may reference images not displayed]

PROCEDURE:     DXR - DXR ABDOMEN 2 V FLAT AND ERECT  - April 09, 2006 [DATE]

RESULT:       Flat and erect views of the abdomen show contrast material in
a few loops of small bowel but primarily in the colon.  The air-fluid levels
observed in small bowel loops on the prior exam of 04/05/06 are no longer
definitely seen.  The findings are compatible with improvement in small
bowel ileus or partial obstruction present previously.
IMPRESSION: 1.     No bowel obstruction is identified.
2.     The previously noted air-fluid levels are no longer definitely seen.
3.     Barium is present in the colon.

## 2007-11-25 ENCOUNTER — Ambulatory Visit: Payer: Self-pay | Admitting: Internal Medicine

## 2008-02-10 ENCOUNTER — Ambulatory Visit: Payer: Self-pay | Admitting: Unknown Physician Specialty

## 2008-03-29 IMAGING — CR DG ABDOMEN 3V
1 series · 5 of 5 positions shown · non-contrast
Comparison: none

REASON FOR EXAM: Pain
COMMENTS:

[Series 1: view not recorded · 0.17mm/px · 5 of 5 slices shown]
[im 1/5]
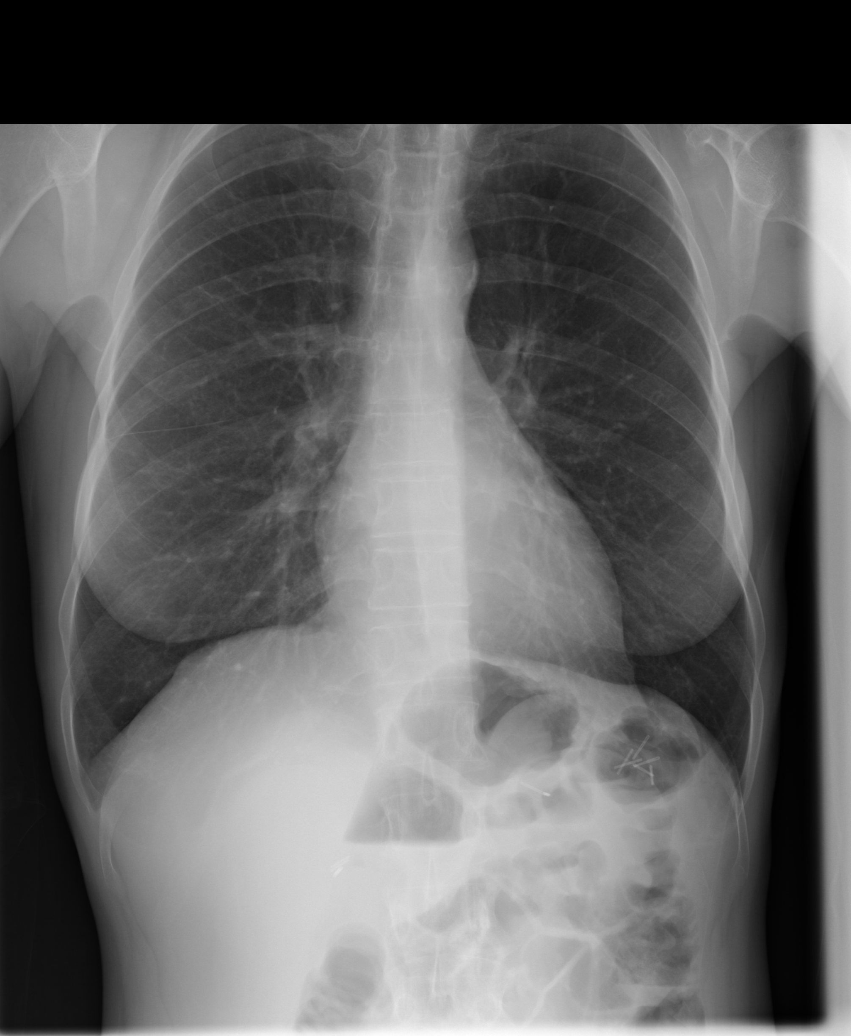
[im 2/5]
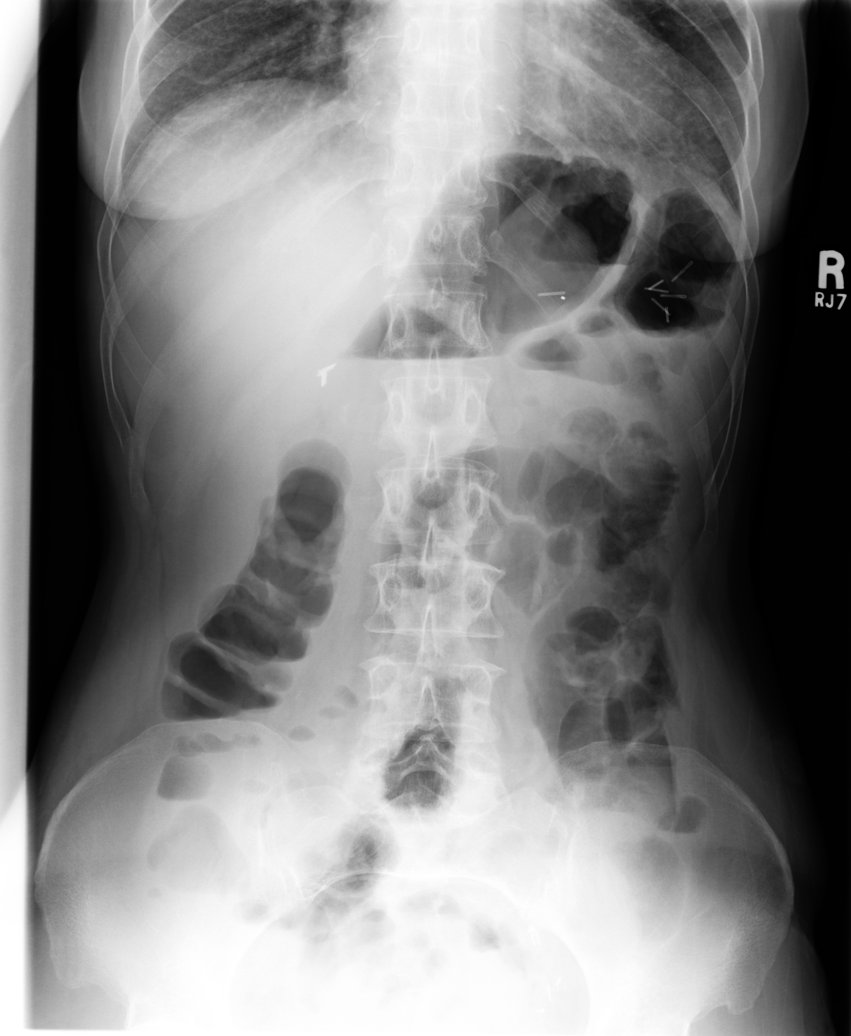
[im 3/5]
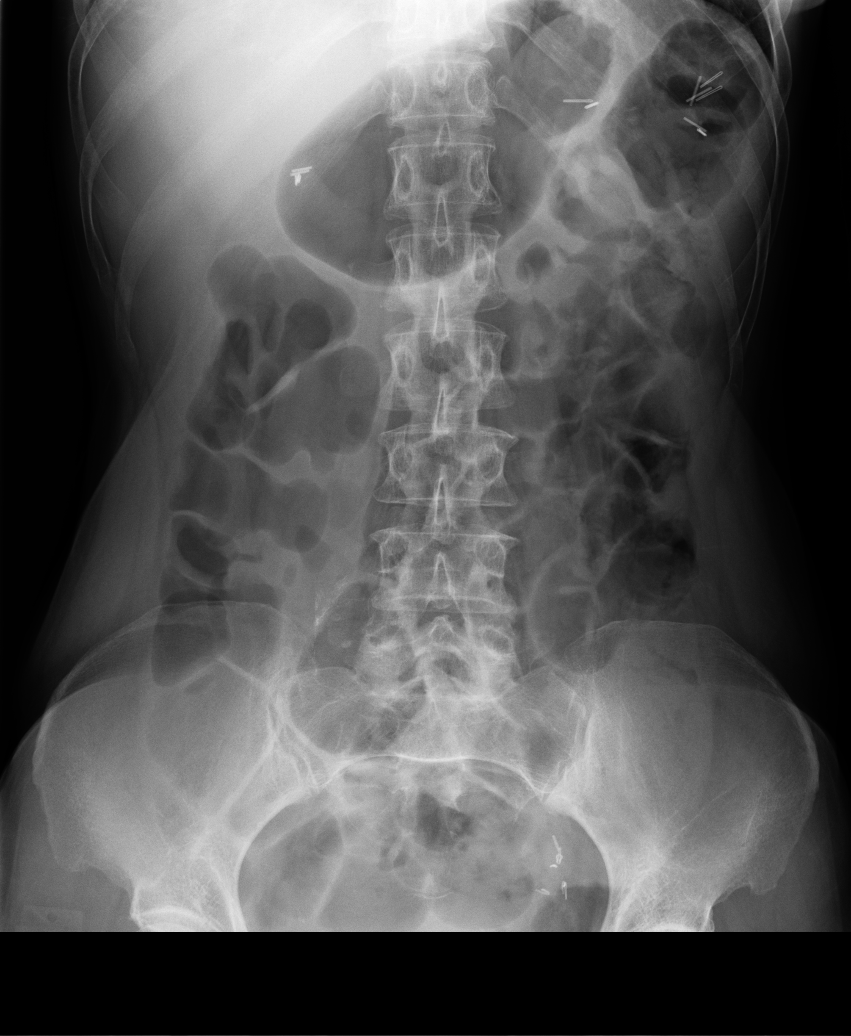
[im 4/5]
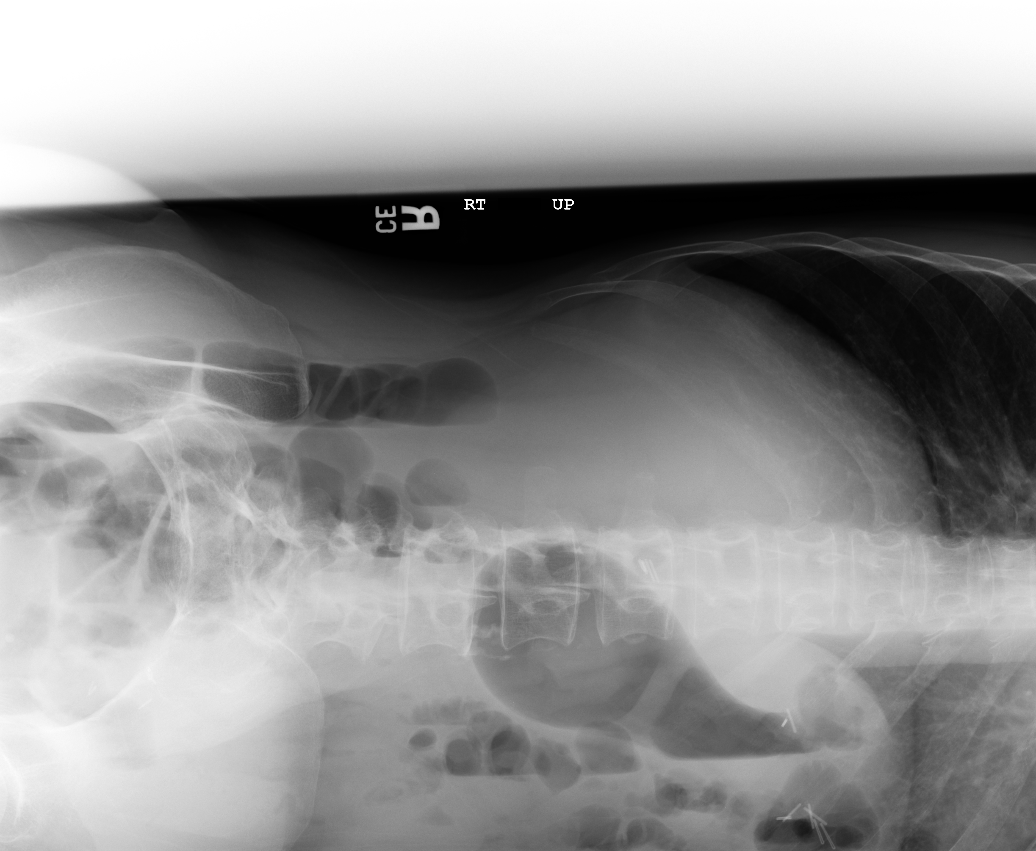
[im 5/5]
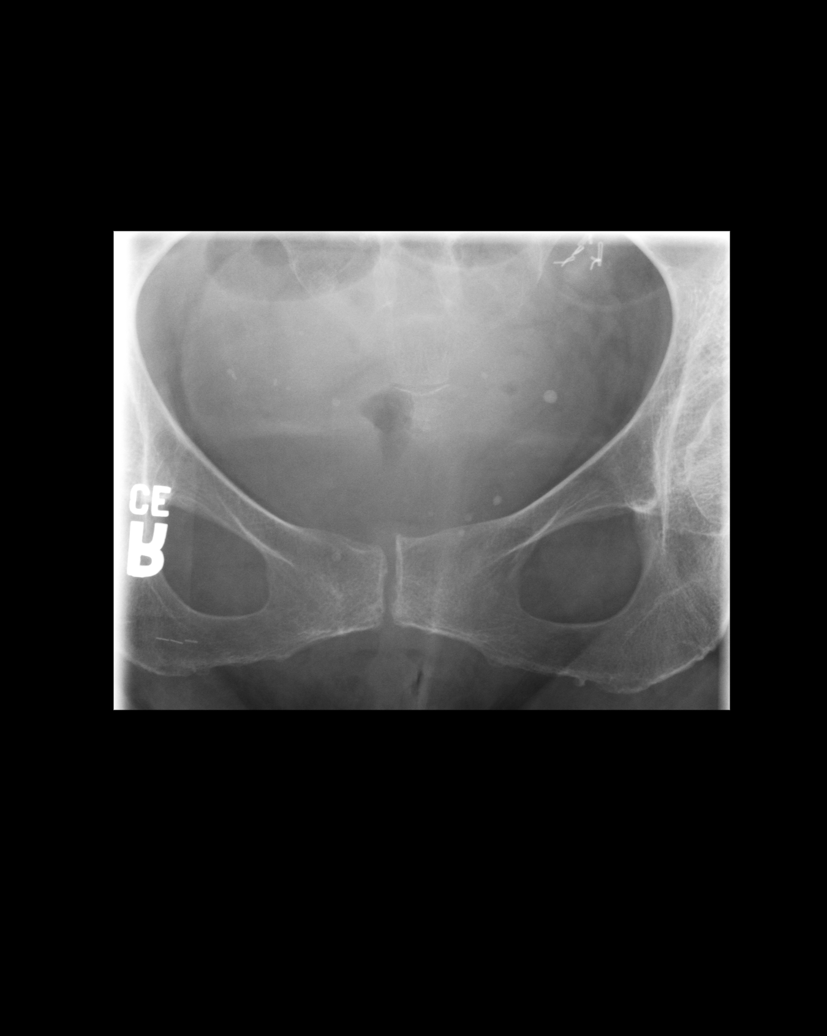

[5 of 5 positions shown; findings below may reference images not displayed]

PROCEDURE:     DXR - DXR ABDOMEN 3-WAY (INCL PA CXR)  - September 18, 2006 [DATE]

RESULT:     PA view of the chest shows the lung fields to be clear.

Four views of the abdomen were obtained. No subdiaphragmatic free air is
seen. Gas is visualized in both the large and small bowel. Fluid levels are
noted in loops of both large and small bowel. The findings are nonspecific
but are of the type that can be associated with gastroenteritis, enteritis
or intra-abdominal inflammation. Currently, no evidence for bowel
obstruction is seen. Post-operative metallic clips are noted in the LEFT
upper quadrant, RIGHT upper quadrant and LEFT pelvis. No definitely abnormal
intra-abdominal calcifications are seen.
IMPRESSION: 1.     The lung fields are clear.
2.     There are nonspecific fluid levels in loops of large and small bowel.
Additionally, a fluid level is seen in the stomach. Currently, no findings
are identified to indicate bowel obstruction.

## 2008-03-30 IMAGING — CR DG ABDOMEN 2V
1 series · 2 of 2 positions shown · non-contrast
Comparison: none

REASON FOR EXAM: Follow up SBO
COMMENTS:

[Series 1: view not recorded · 0.17mm/px · 2 of 2 slices shown]
[im 1/2]
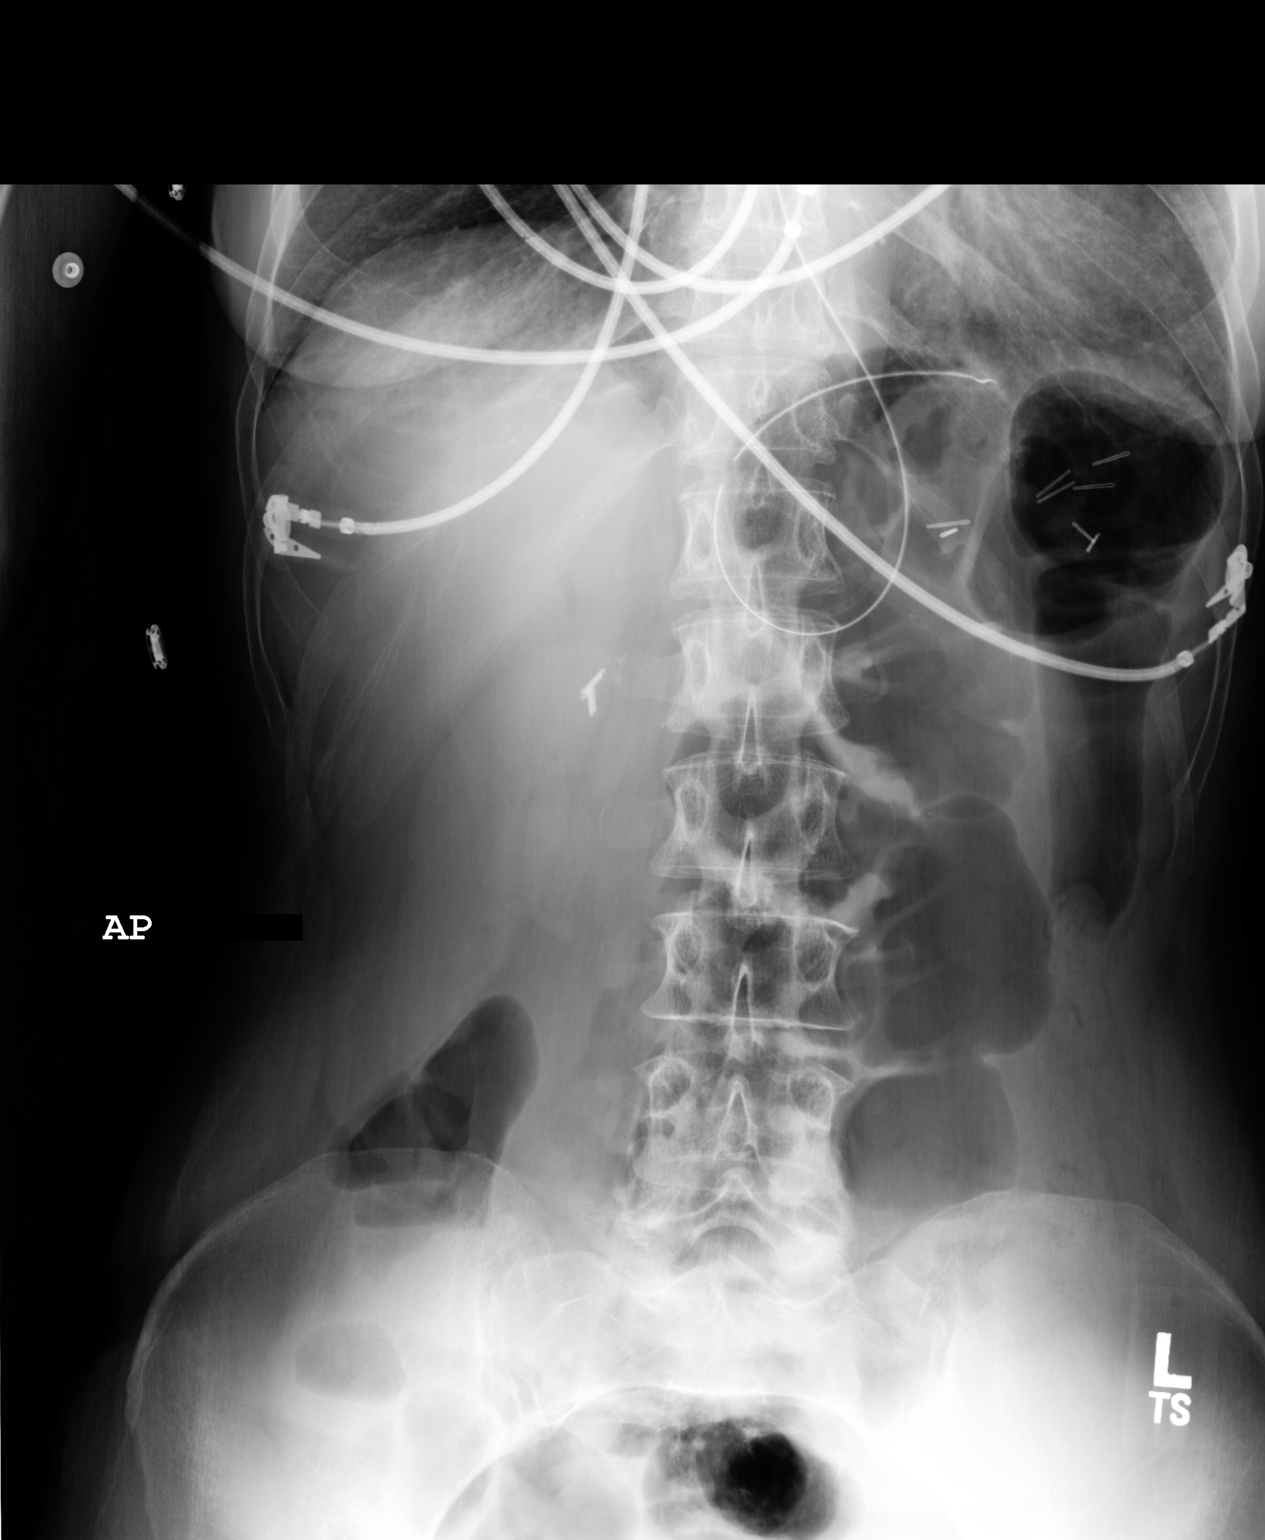
[im 2/2]
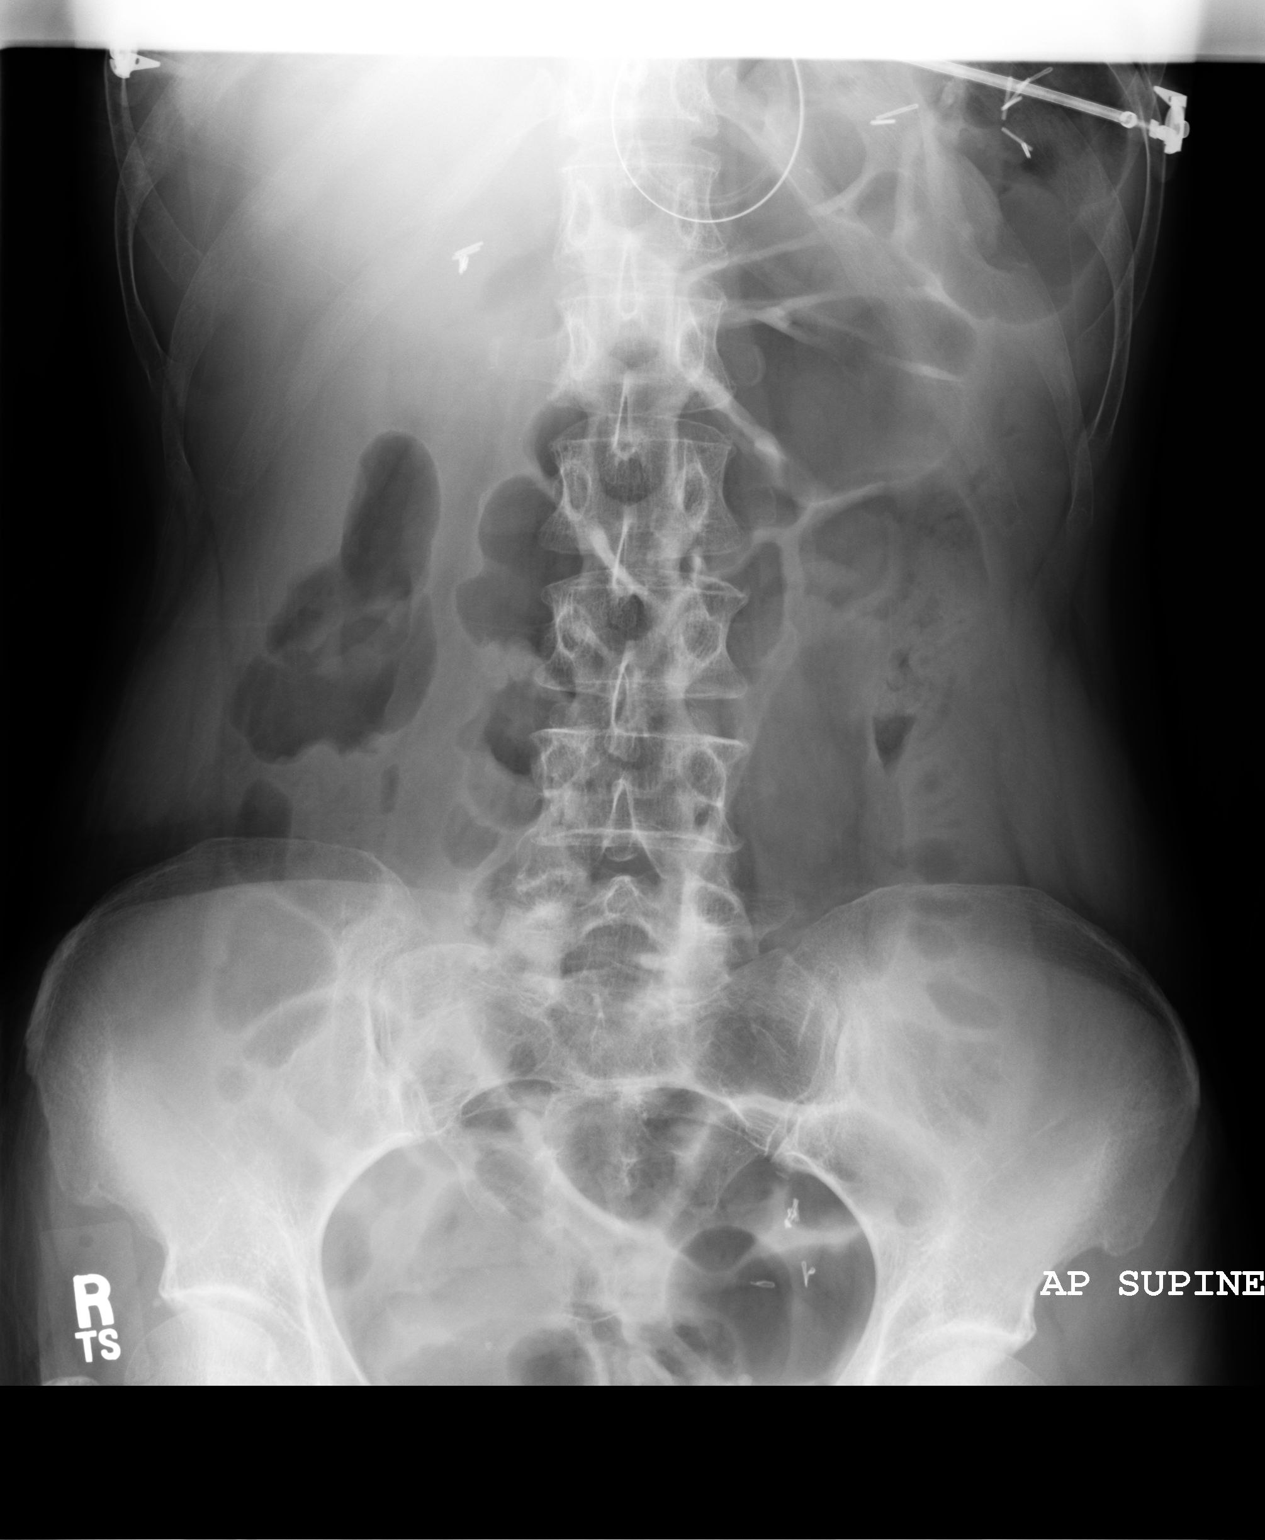

[2 of 2 positions shown; findings below may reference images not displayed]

PROCEDURE:     DXR - DXR ABDOMEN 2 V FLAT AND ERECT  - September 19, 2006  [DATE]

RESULT:       Flat and erect views of the abdomen show no subdiaphragmatic
free air. Gas is visualized in loops of large and small bowel.  In the erect
view, a few nonspecific small fluid levels are noted in the RIGHT abdomen.
There are postoperative metallic clips in the LEFT upper quadrant and in the
region of the gallbladder bed. A nasogastric tube is present.
IMPRESSION: 1.     No subdiaphragmatic free air is seen.
2.     No bowel obstruction is identified.
3.     A few nonspecific air-fluid levels are noted in the RIGHT abdomen.

## 2008-04-10 IMAGING — CR PARANASAL SINUSES - COMPLETE 3 + VIEW
1 series · 5 of 5 positions shown · non-contrast
Comparison: none

REASON FOR EXAM: Sinusitis
COMMENTS:

PROCEDURE:     DXR - DXR SINUSES PARANASAL COMPLETE  - September 30, 2006 [DATE]
RESULT:     The paranasal sinuses are clear. The mastoids are clear. No bony
abnormality is identified.

[Series 1: view not recorded · 0.17mm/px · 5 of 5 slices shown]
[im 1/5]
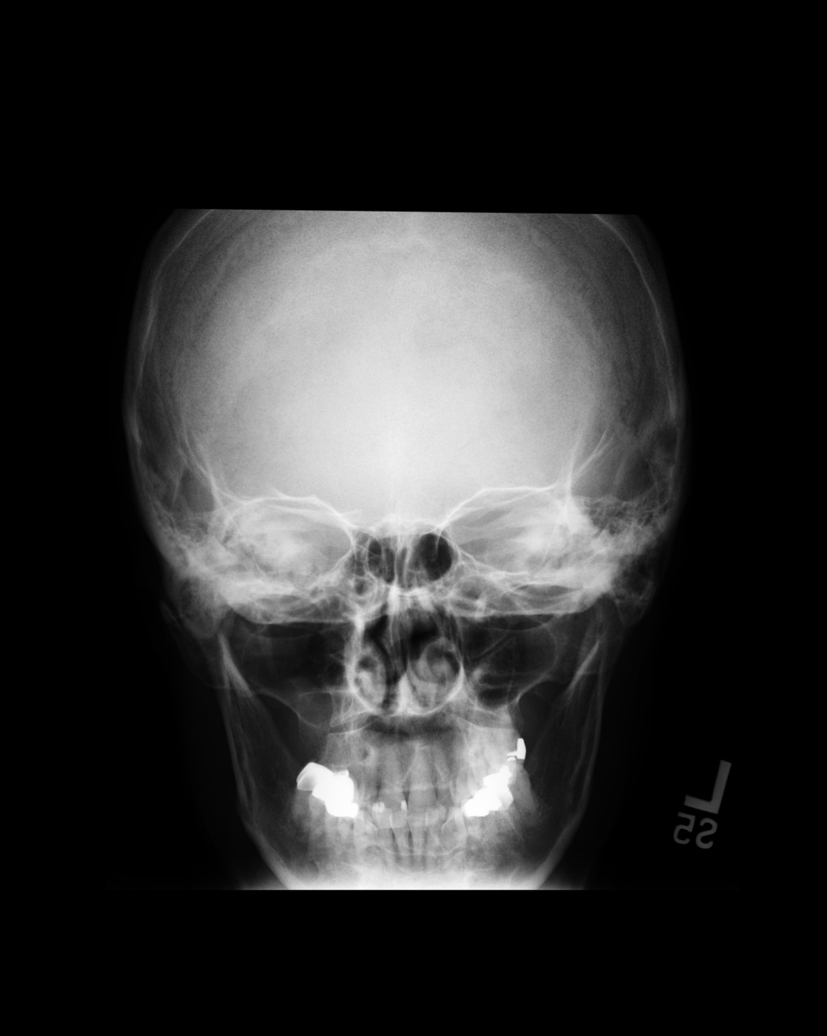
[im 2/5]
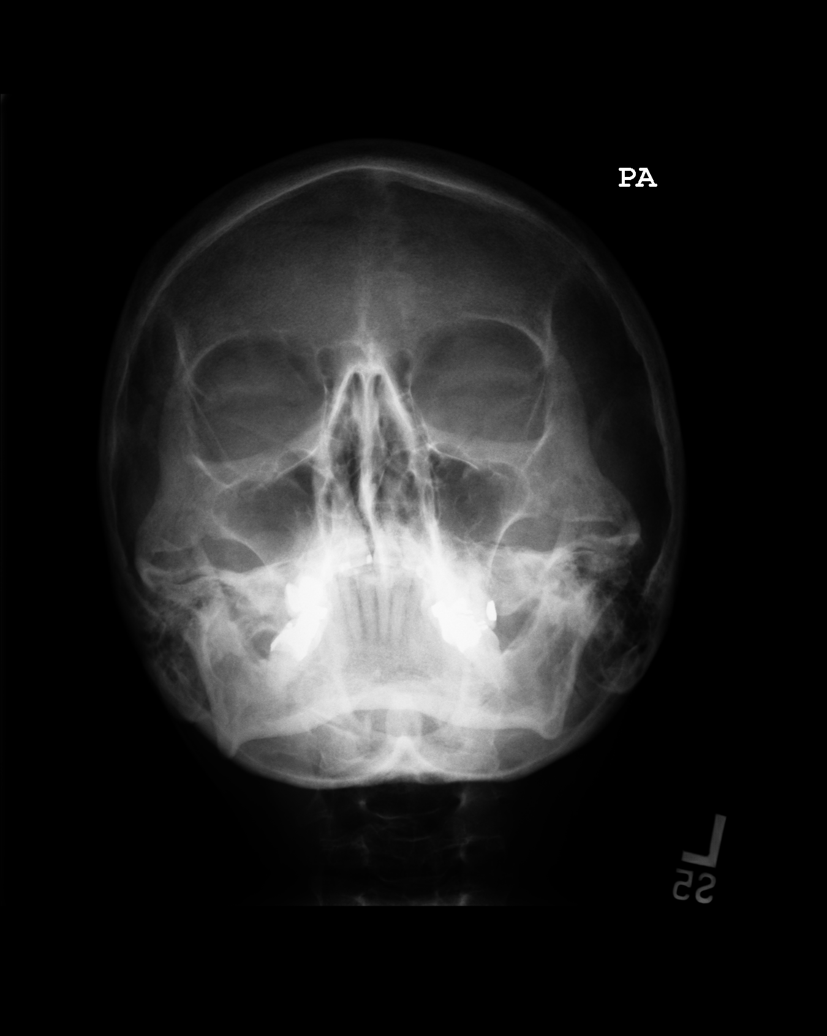
[im 3/5]
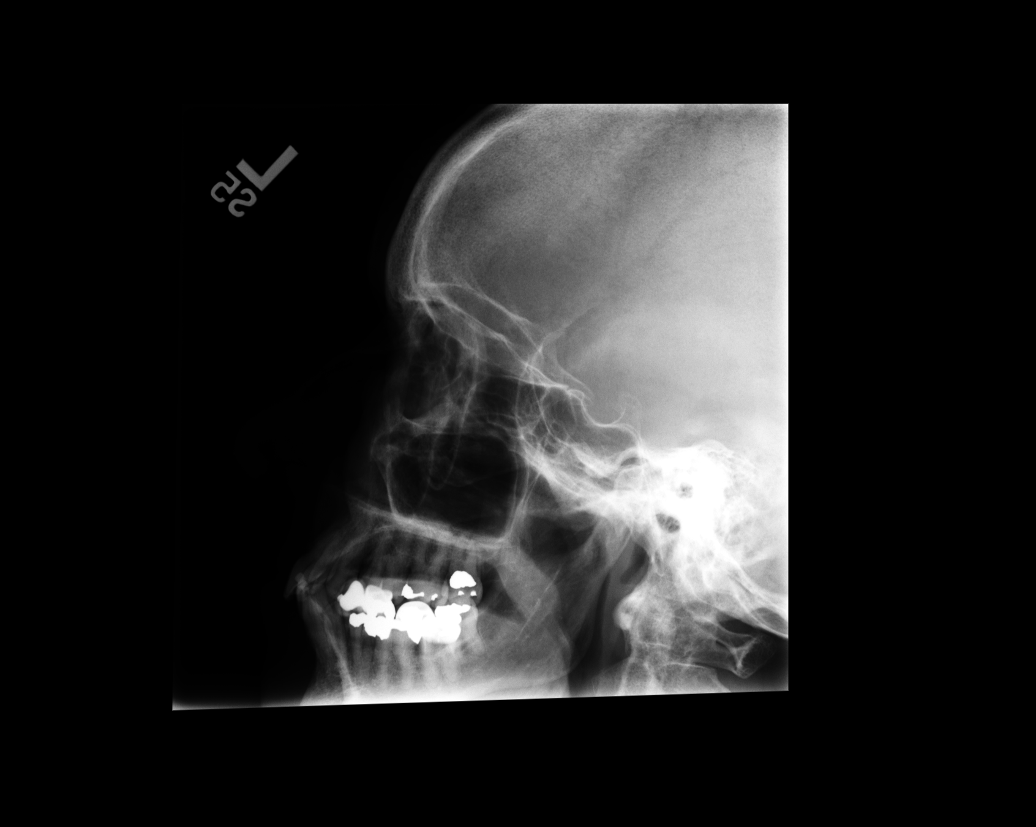
[im 4/5]
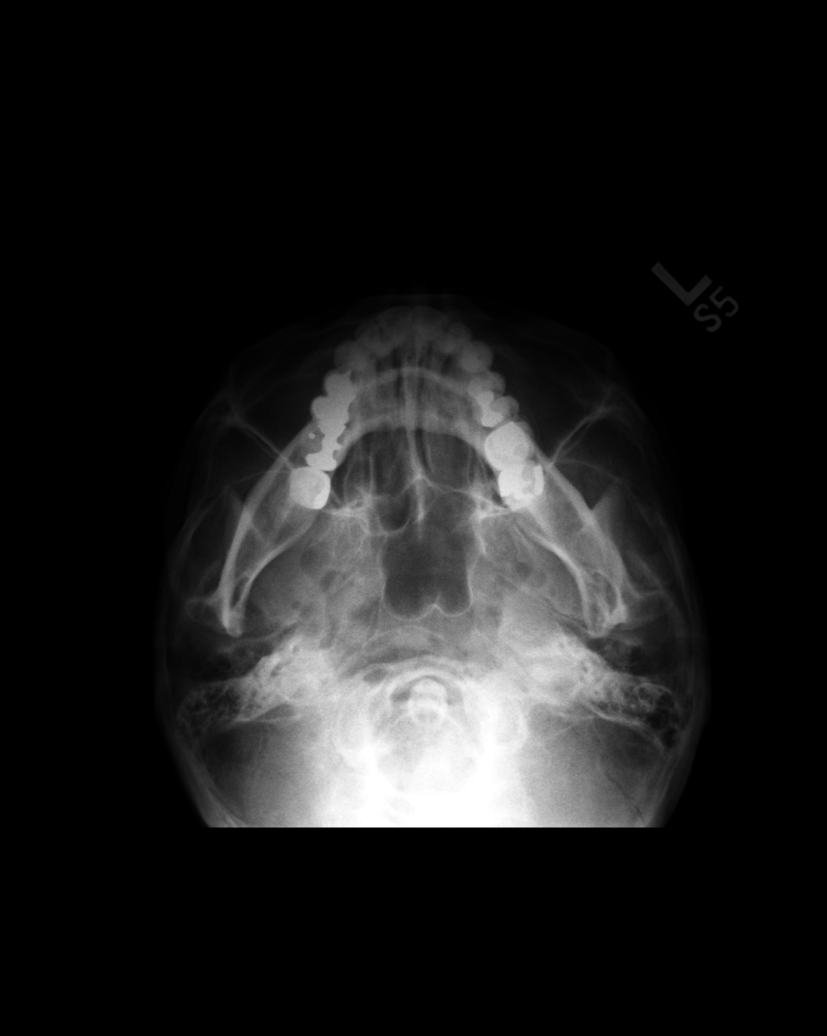
[im 5/5]
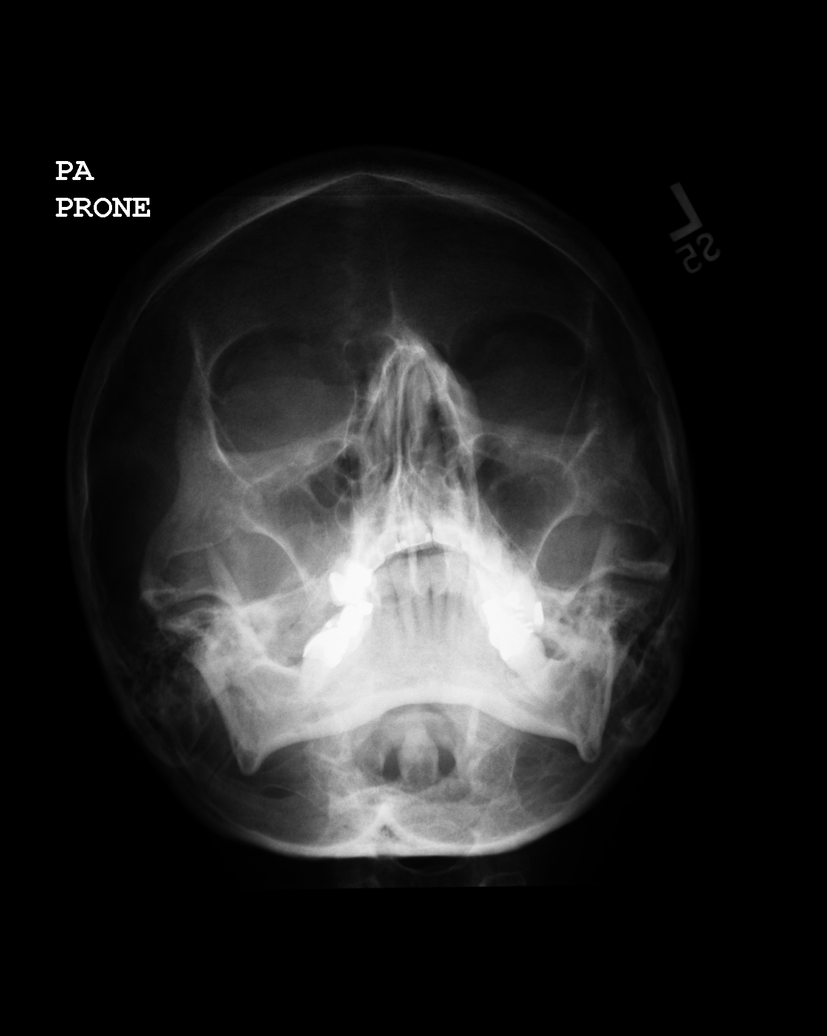

[5 of 5 positions shown; findings below may reference images not displayed]

IMPRESSION: 1)No acute abnormalities.

## 2008-05-03 ENCOUNTER — Ambulatory Visit (HOSPITAL_BASED_OUTPATIENT_CLINIC_OR_DEPARTMENT_OTHER): Admission: RE | Admit: 2008-05-03 | Discharge: 2008-05-04 | Payer: Self-pay | Admitting: Orthopedic Surgery

## 2008-06-30 IMAGING — CR DG ABDOMEN 3V
1 series · 5 of 5 positions shown · non-contrast
Comparison: none

REASON FOR EXAM: bowel obstruction
COMMENTS:

[Series 1: view not recorded · 0.17mm/px · 5 of 5 slices shown]
[im 1/5]
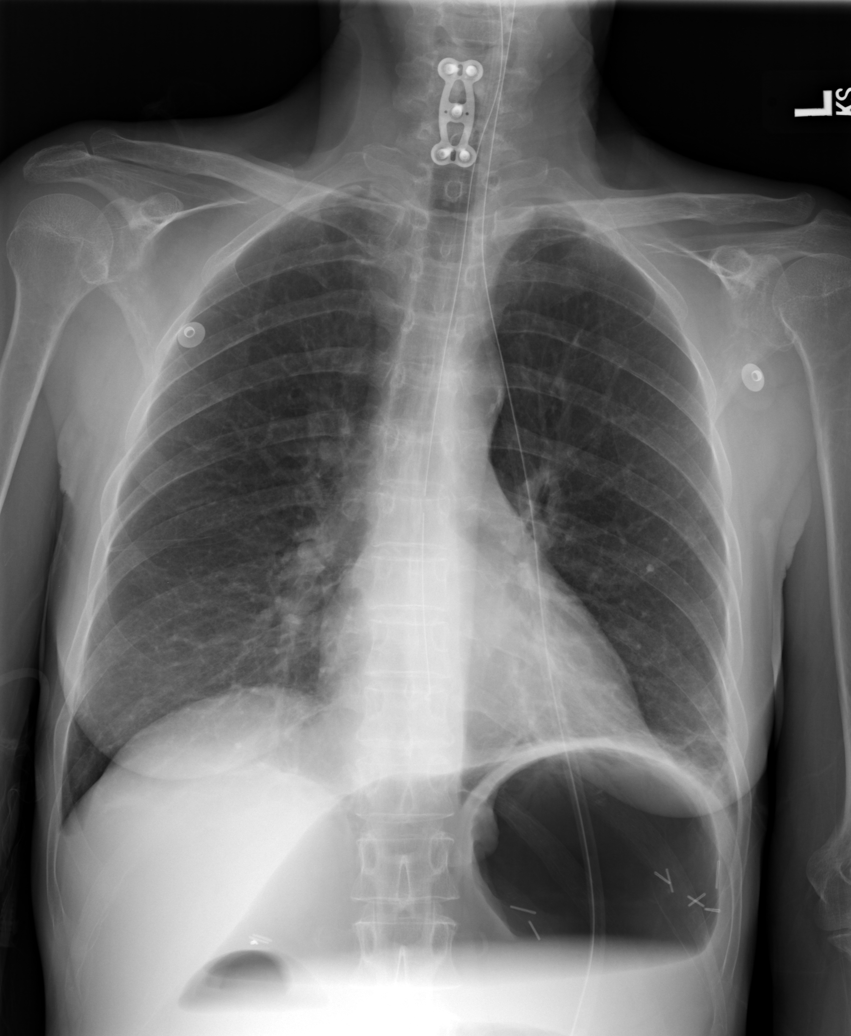
[im 2/5]
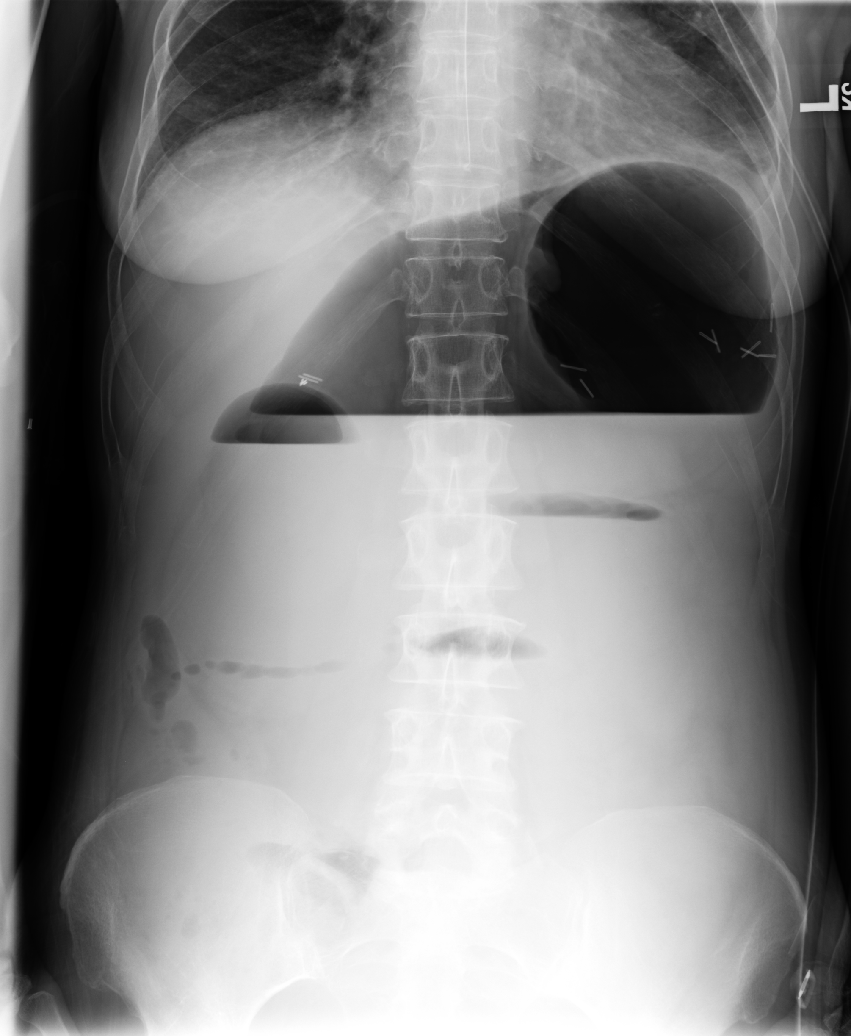
[im 3/5]
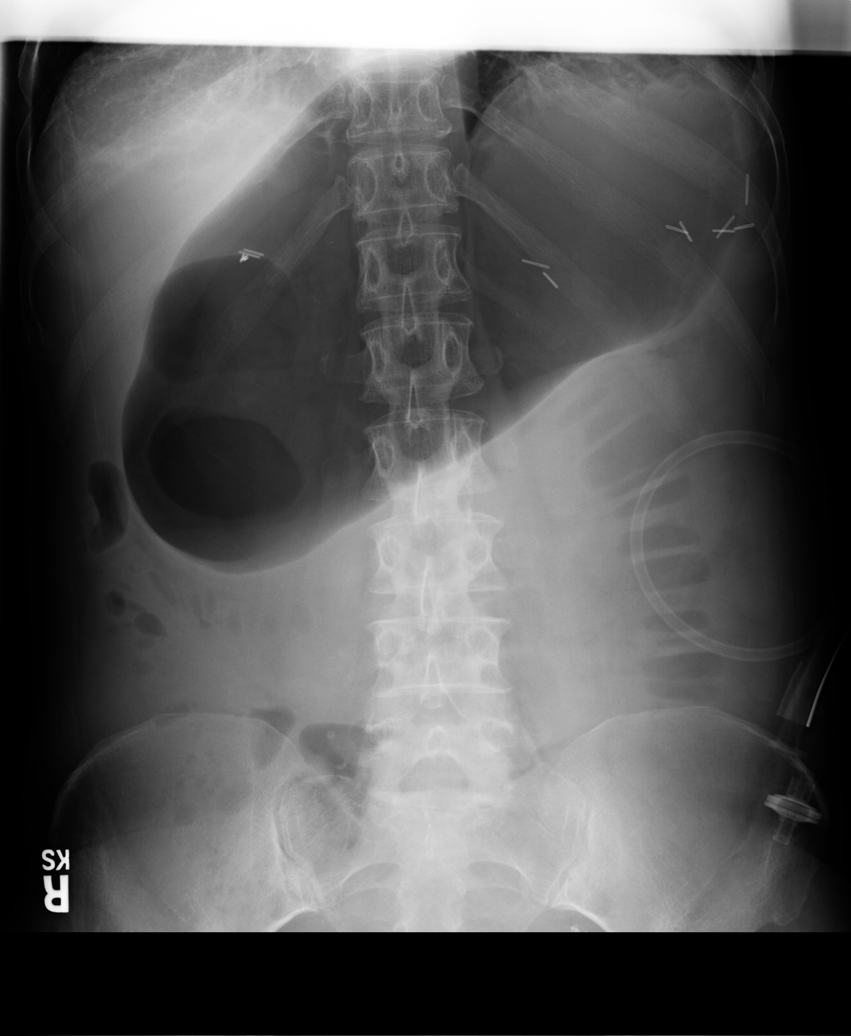
[im 4/5]
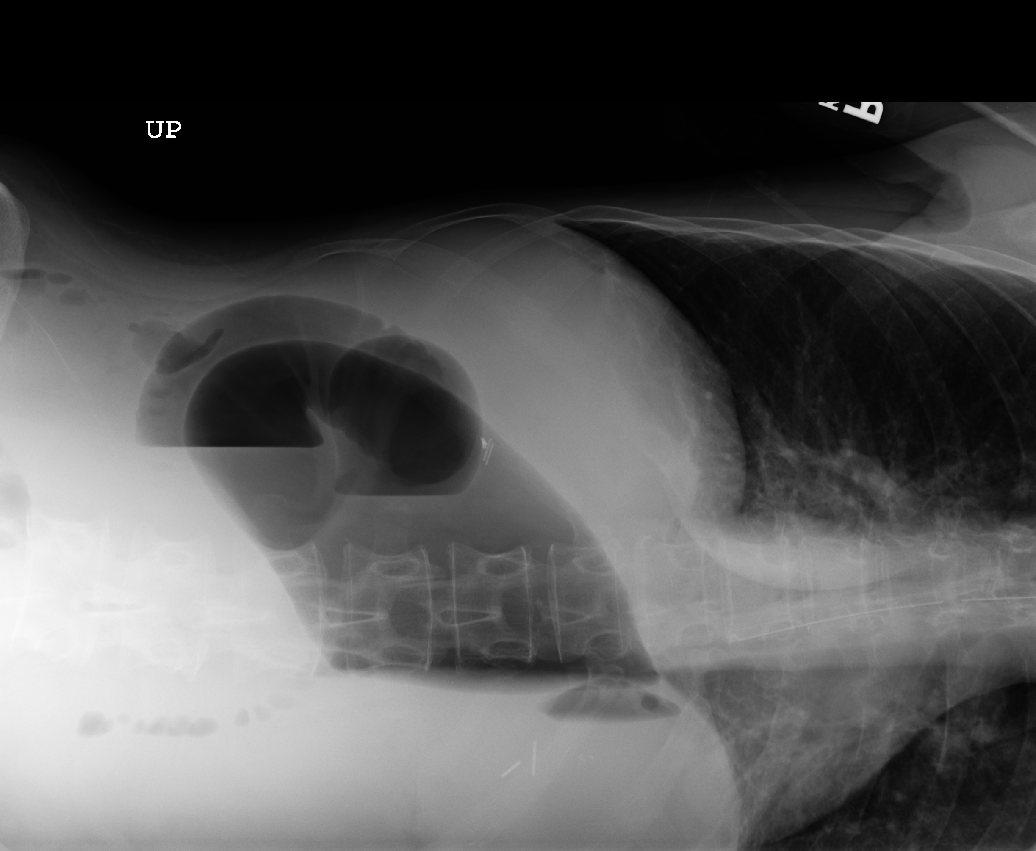
[im 5/5]
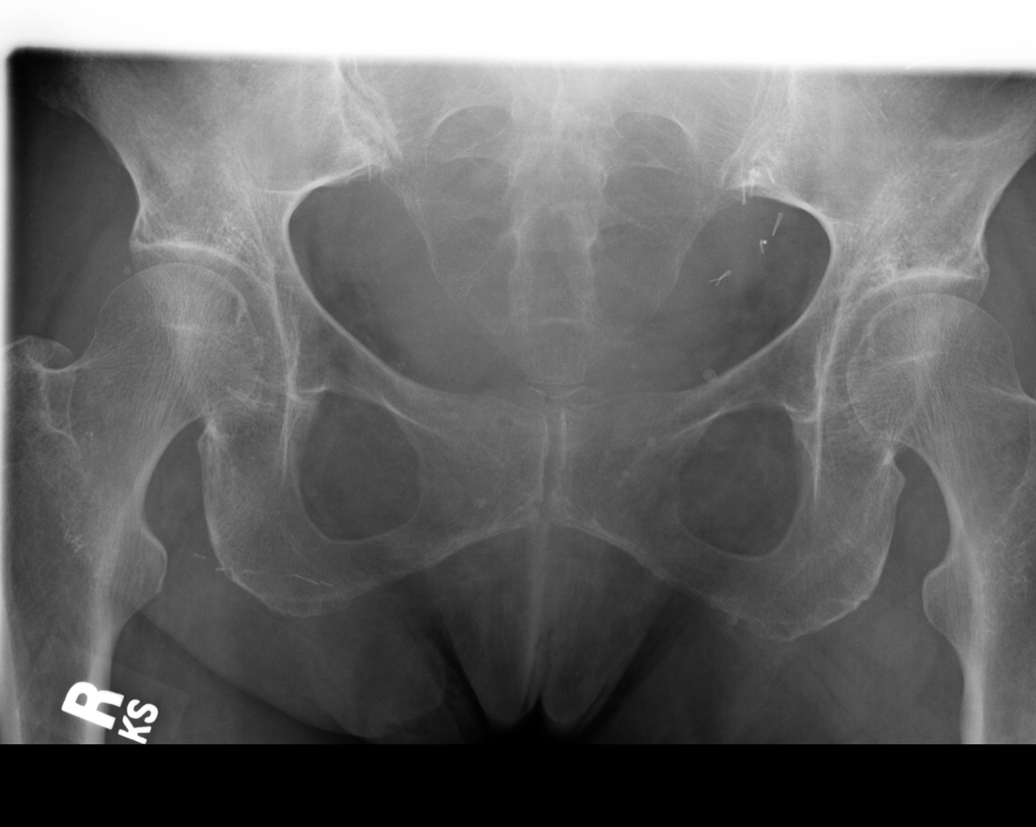

[5 of 5 positions shown; findings below may reference images not displayed]

PROCEDURE:     DXR - DXR ABDOMEN 3-WAY (INCL PA CXR)  - December 21, 2006  [DATE]

RESULT:     Dilated loops of proximal small bowel are noted containing
fluid.  The stomach and duodenum are extremely dilated.  NG tube is noted
with its tip at the gastroesophageal junction.  Distal placement may prove
useful.  Basilar atelectasis is noted.  Cardiovascular structures are
unremarkable.

This report was phoned to the floor with instructions to inform the
patient's physician.
IMPRESSION: Small bowel obstruction with associated prominent gastroduodenal
obstruction.  Surgical clips are present in the abdomen.  Adhesions may be
the etiology.

NG tube has been placed with its tip at the gastroesophageal junction.  More
distal placement may prove useful.

## 2008-06-30 IMAGING — CR DG ABDOMEN 3V
1 series · 4 of 4 positions shown · non-contrast
Comparison: none

REASON FOR EXAM: abd pain//rm 5
COMMENTS:

[Series 1: view not recorded · 0.17mm/px · 4 of 4 slices shown]
[im 1/4]
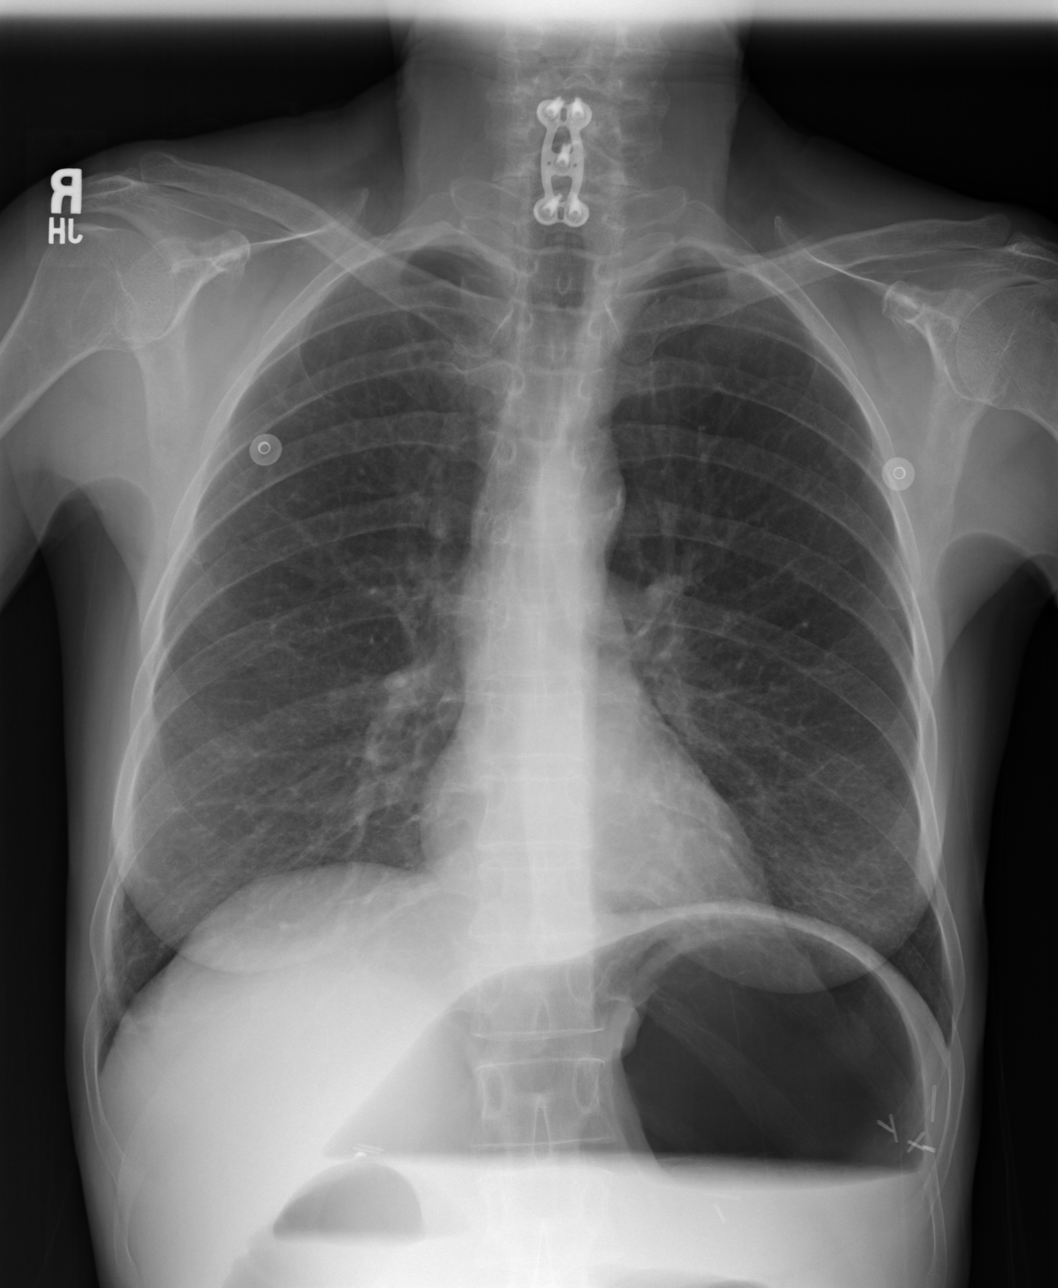
[im 2/4]
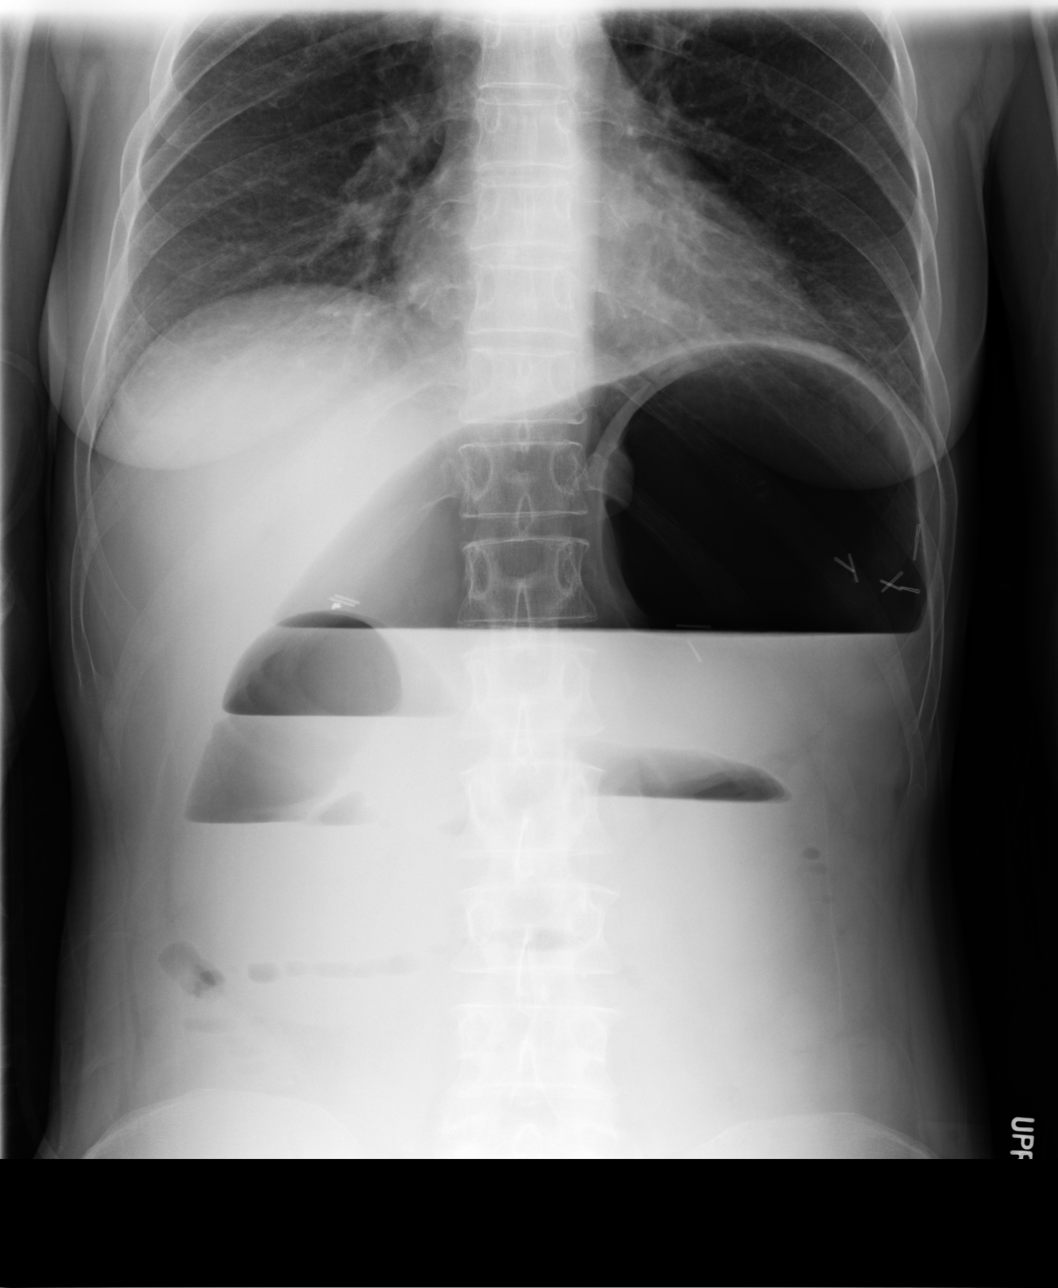
[im 3/4]
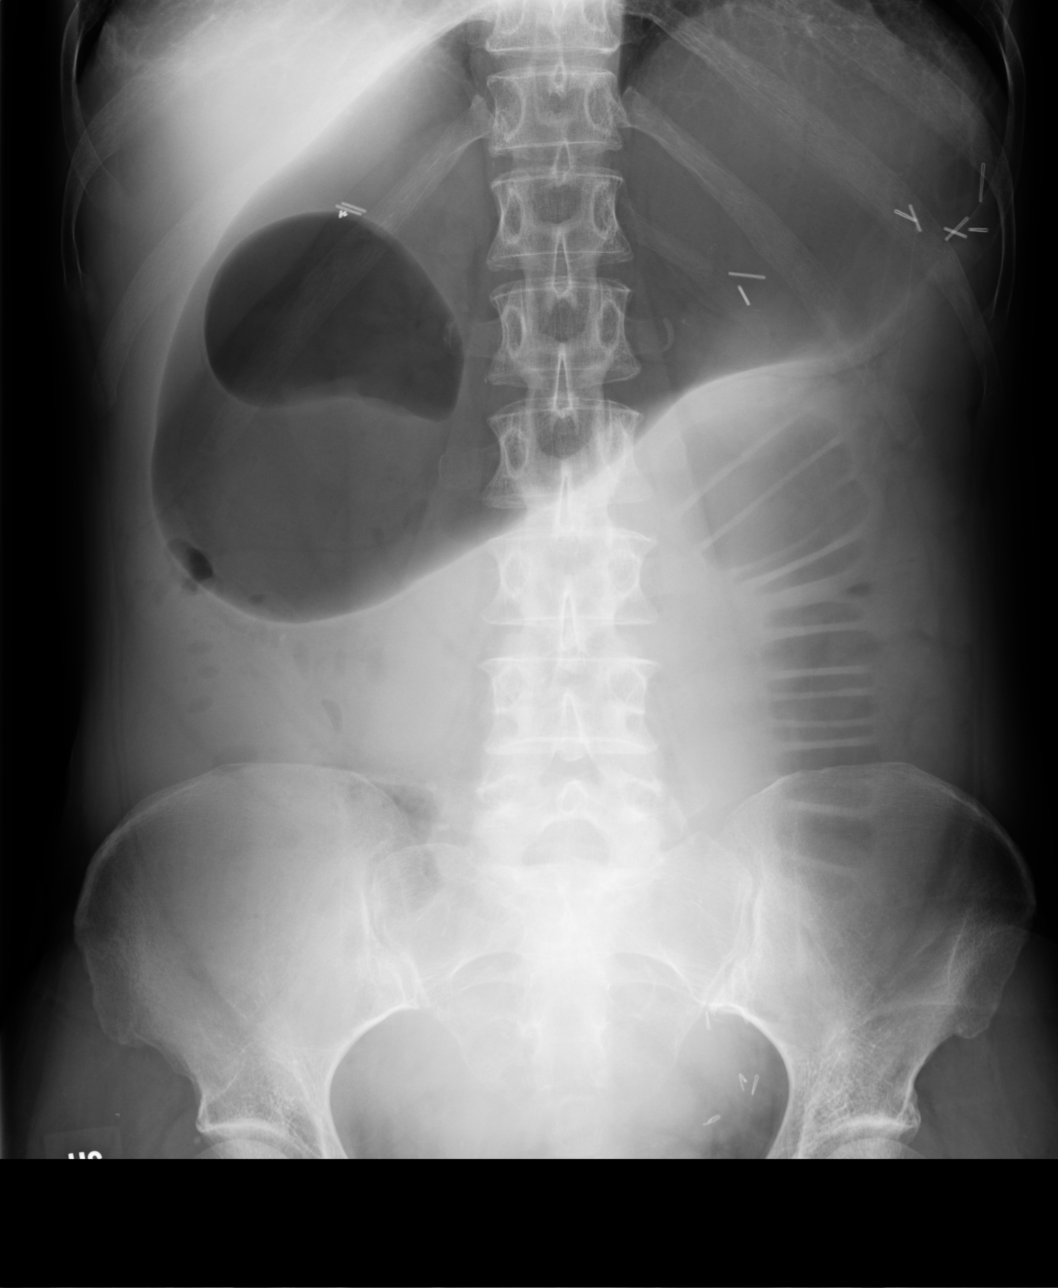
[im 4/4]
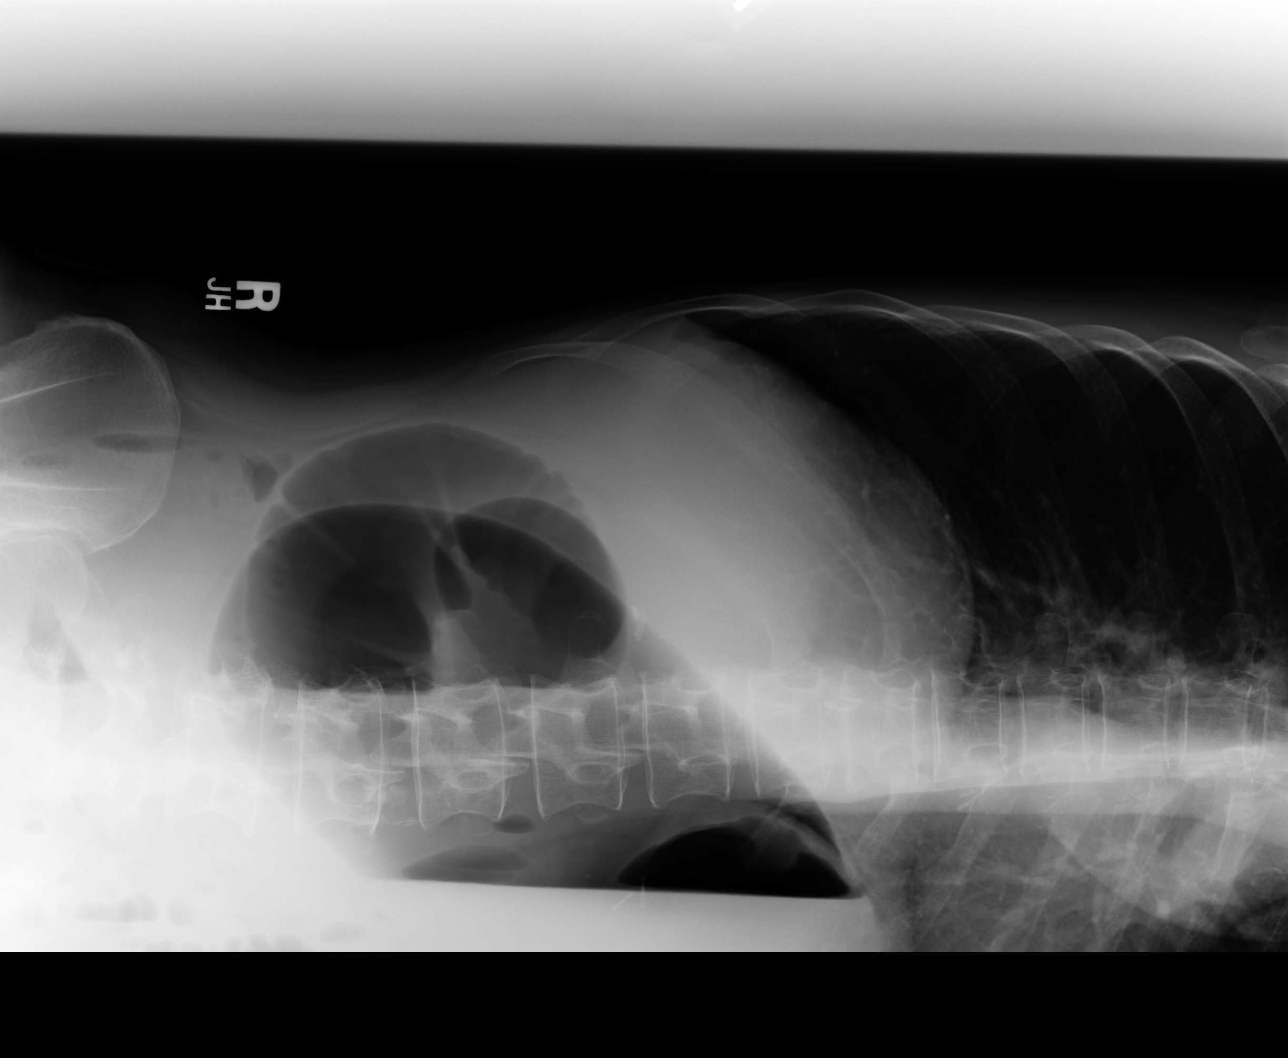

[4 of 4 positions shown; findings below may reference images not displayed]

PROCEDURE:     DXR - DXR ABDOMEN 3-WAY (INCL PA CXR)  - December 20, 2006 [DATE]

RESULT:     The mediastinum and hilar structures are normal.  The lungs are
clear.  Cardiovascular structures are unremarkable.  Small bowel and
gastroduodenal distention is noted.  These findings are consistent with a
proximal small bowel obstruction.  There is a paucity of distal gas.
Surgical clips are noted in the pelvis.  Given there are surgical clips in
the abdomen and pelvis the possibility of adhesions should be considered.
IMPRESSION: Findings consistent with proximal small bowel complete obstruction.  There
is proximal small bowel and gastroduodenal distention.

Surgical clips are noted in the abdomen and pelvis.  The possibility of
adhesions should be considered in this patient.

## 2008-07-03 IMAGING — CT CT ABD-PELV W/ CM
1 of 2 series · 14 of 32 positions shown, 18 images · non-contrast
Comparison: none

REASON FOR EXAM: (1) small bowel obstruction; (2) small bowel obstruction
COMMENTS:  LMP: Post-Menopausal

[Series 2: abdomen · axial · 0.60mm/px · z∈[+105,+473]mm · 14 of 54 slices shown, 18 images]
[im 5/54  soft-tissue]
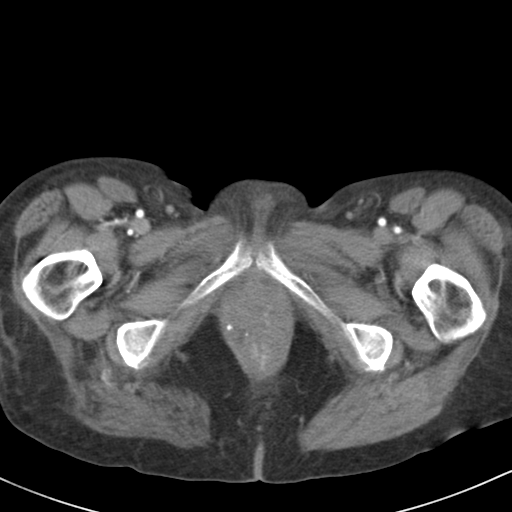
[im 5/54  bone]
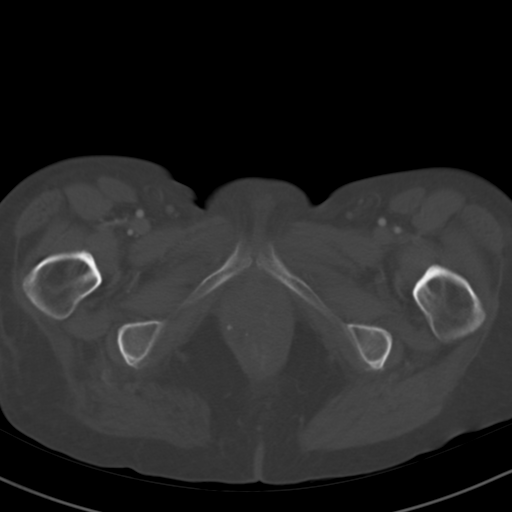
[im 9/54  soft-tissue]
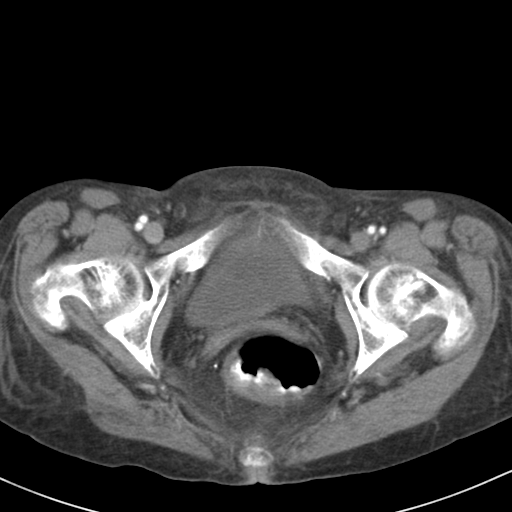
[im 13/54  soft-tissue]
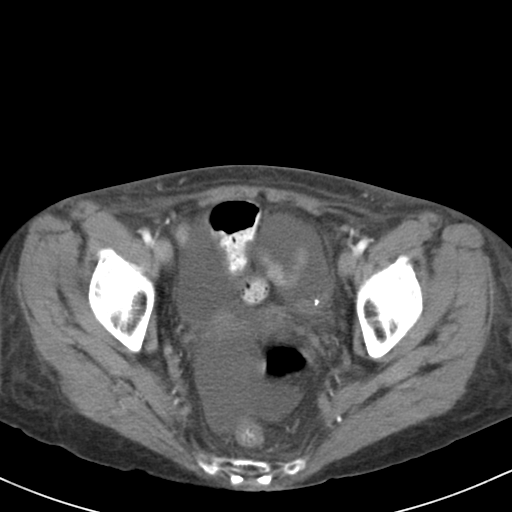
[im 17/54  soft-tissue]
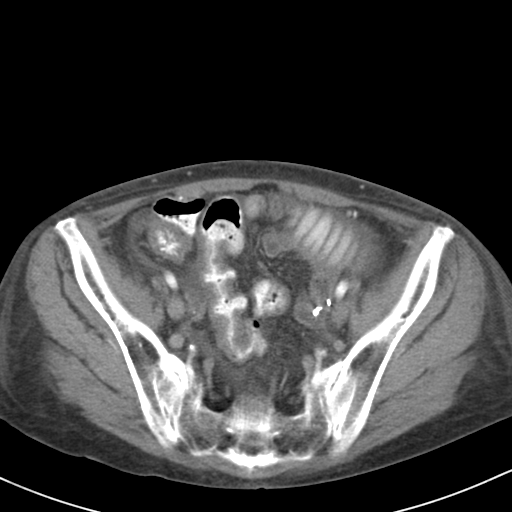
[im 21/54  soft-tissue]
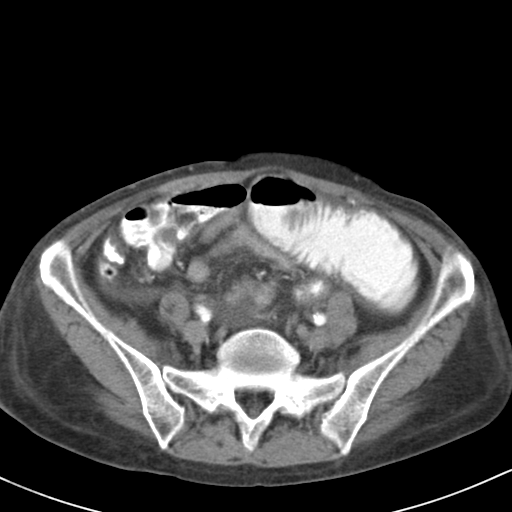
[im 25/54  soft-tissue]
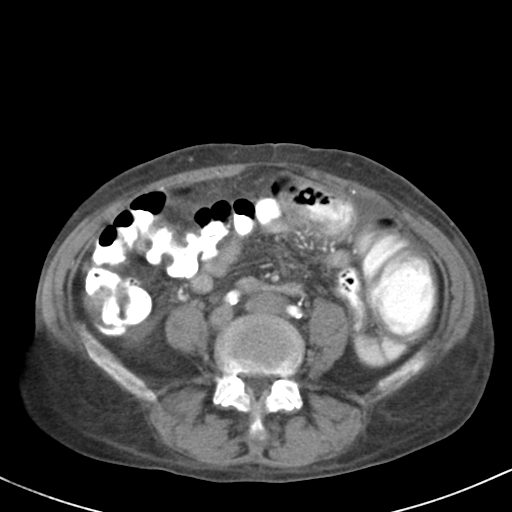
[im 29/54  soft-tissue]
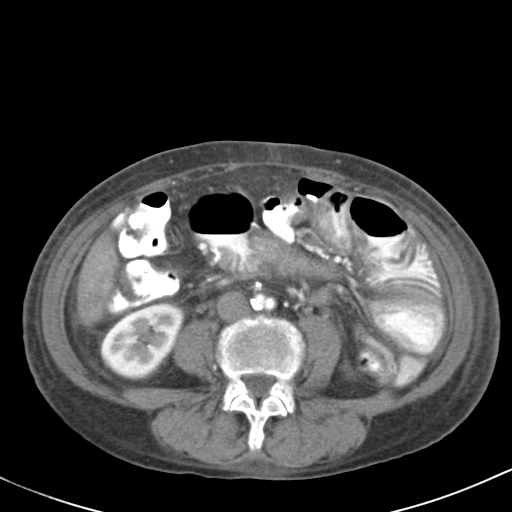
[im 33/54  soft-tissue]
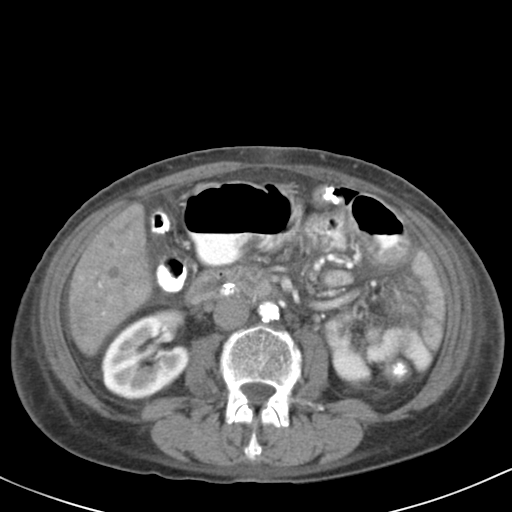
[im 37/54  soft-tissue]
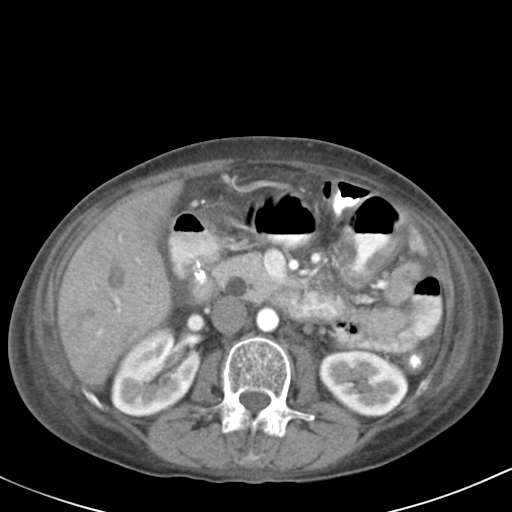
[im 37/54  bone]
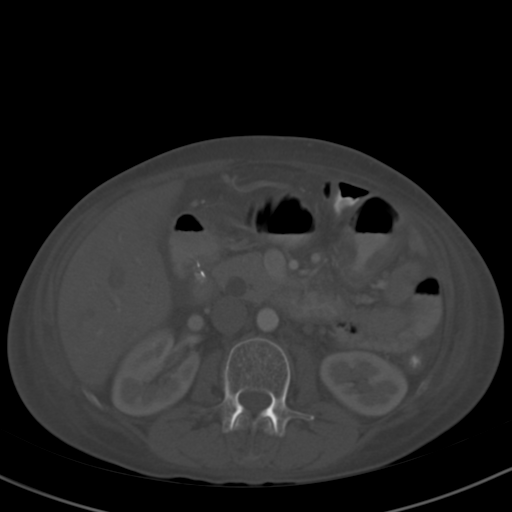
[im 41/54  soft-tissue]
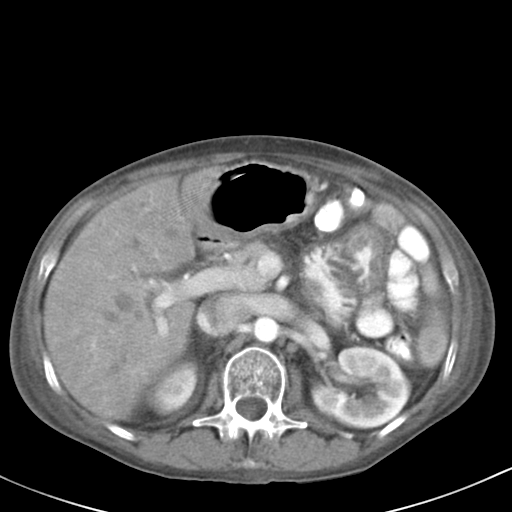
[im 45/54  soft-tissue]
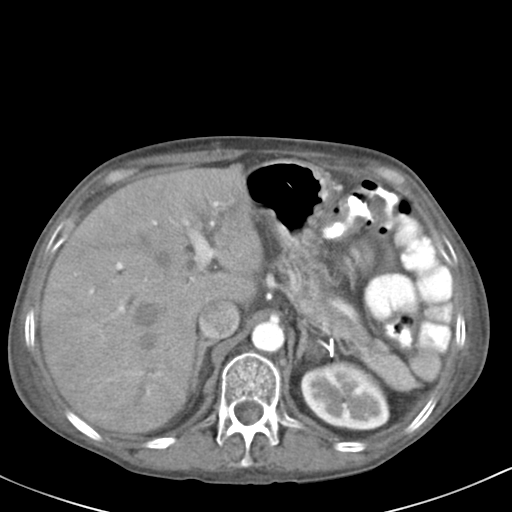
[im 45/54  lung]
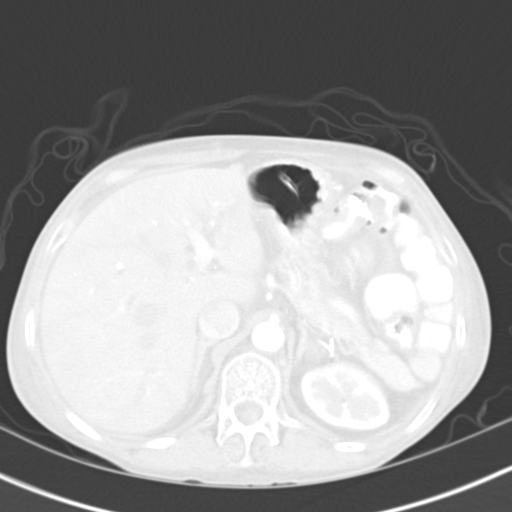
[im 47/54  lung]
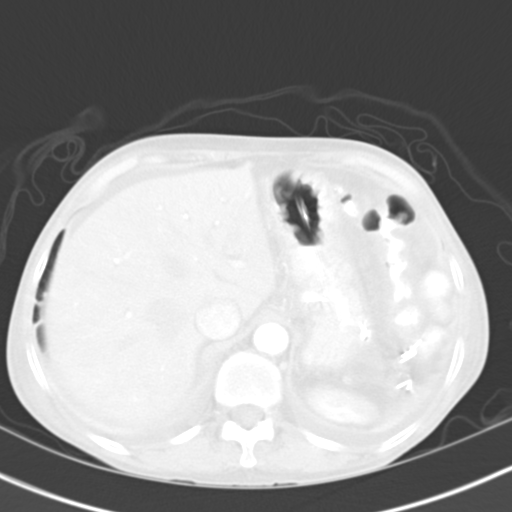
[im 49/54  soft-tissue]
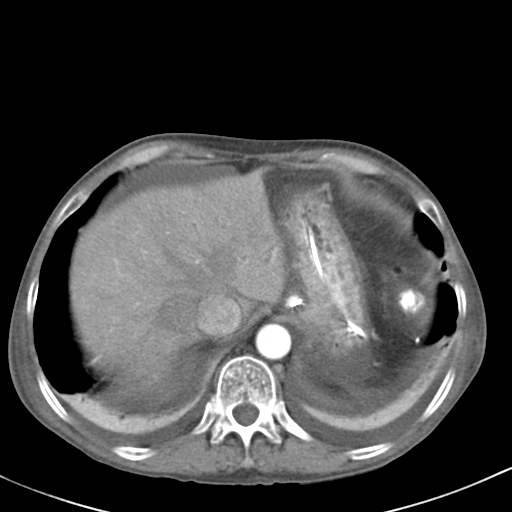
[im 49/54  lung]
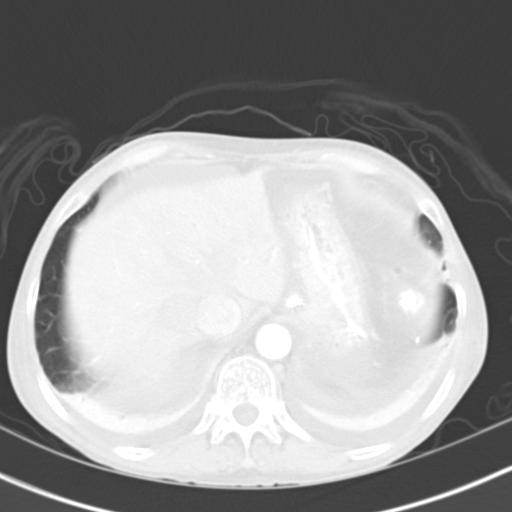
[im 51/54  lung]
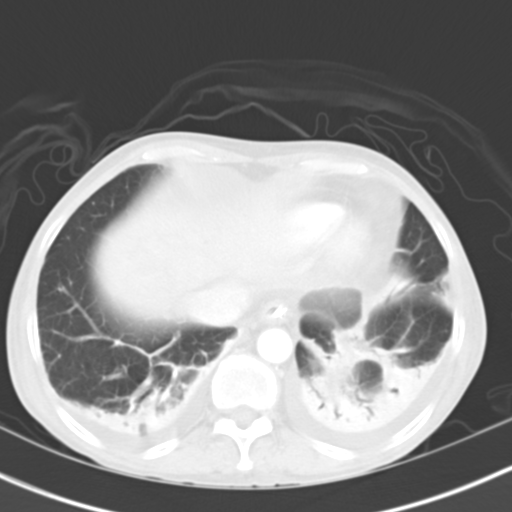

[14 of 32 positions shown; findings below may reference images not displayed]

PROCEDURE:     CT  - CT ABDOMEN / PELVIS  W  - December 23, 2006  [DATE]

RESULT:     Axial 8 mm sections were obtained from the lung bases to the
pubic symphysis status post intravenous administration of 100 ml of Isovue
370 and oral contrast.

Evaluation of the lung bases demonstrates thickened interstitial markings as
well as small bilateral effusions and atelectasis and/or consolidative
infiltrate within the lung bases.

The liver, pancreas, adrenals and kidneys are unremarkable. The spleen is
not identified. The celiac, SMA, IMA, portal vein and SMV appear patent. The
common bile duct is dilated measuring 1.8 cm in diameter. There does not
appear to be evidence of intrahepatic biliary ductal dilatation. The patient
is status post cholecystectomy. An NG tube has been placed with the tip
projected in the region of the mid duodenum.

A small amount of ascites is demonstrated within the upper abdomen with a
small to moderate amount demonstrated within the pelvis. Dilated loops of
small bowel are demonstrated within the right upper quadrant, which have the
appearance of loops of jejunum. There also appears to be evidence of small
bowel wall thickening within this area and considering the patient's history
of Crohn's disease, this finding is suspicious for Crohn's involvement.
There appears to be a decompressed loop of sigmoid colon within the pelvis,
which is suspicious for possible bowel wall thickening again possibly
representing an element of Crohn's involvement. No definitive masses are
identified nor drainable loculated fluid collections.
IMPRESSION: 1. Findings consistent with a partial small bowel obstruction versus a focal
ileus. There is bowel wall thickening within the small bowel and considering
the patient's history of Crohn's disease, this may represent the sequela of
that etiology. Alternatively, infectious enteritis cannot be excluded.
2. Dilated common bile duct without evidence of intrahepatic biliary ductal
dilatation.
3. Fluid within the region of the cul-de-sac.
4. Decompressed sigmoid colon though an element of bowel wall thickening is
also a diagnostic consideration considering the patient's history.
5. Small bilateral effusions.
6. Atelectasis versus infiltrate in the lung bases.

## 2008-07-04 IMAGING — CR DG ABDOMEN 2V
1 series · 2 of 2 positions shown · non-contrast
Comparison: none

REASON FOR EXAM: Check partial small bowel obst.
COMMENTS:

[Series 1: view not recorded · 0.17mm/px · 2 of 2 slices shown]
[im 1/2]
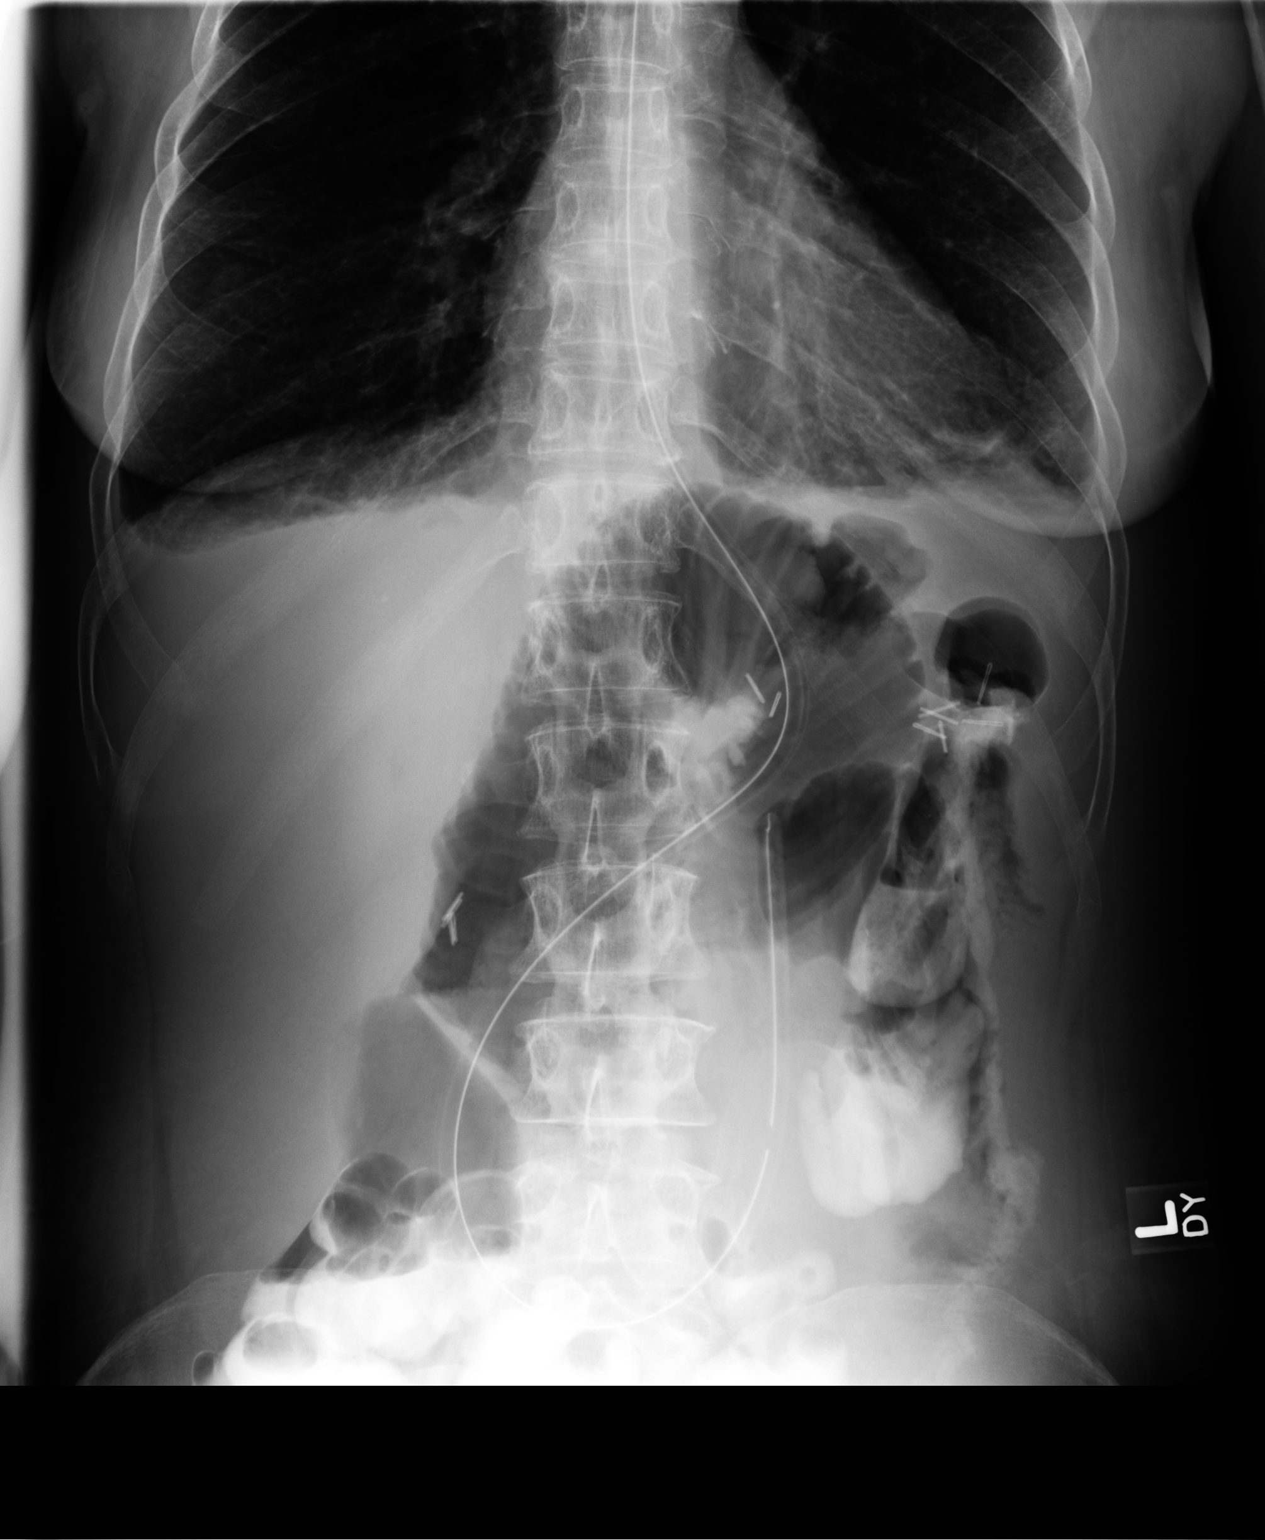
[im 2/2]
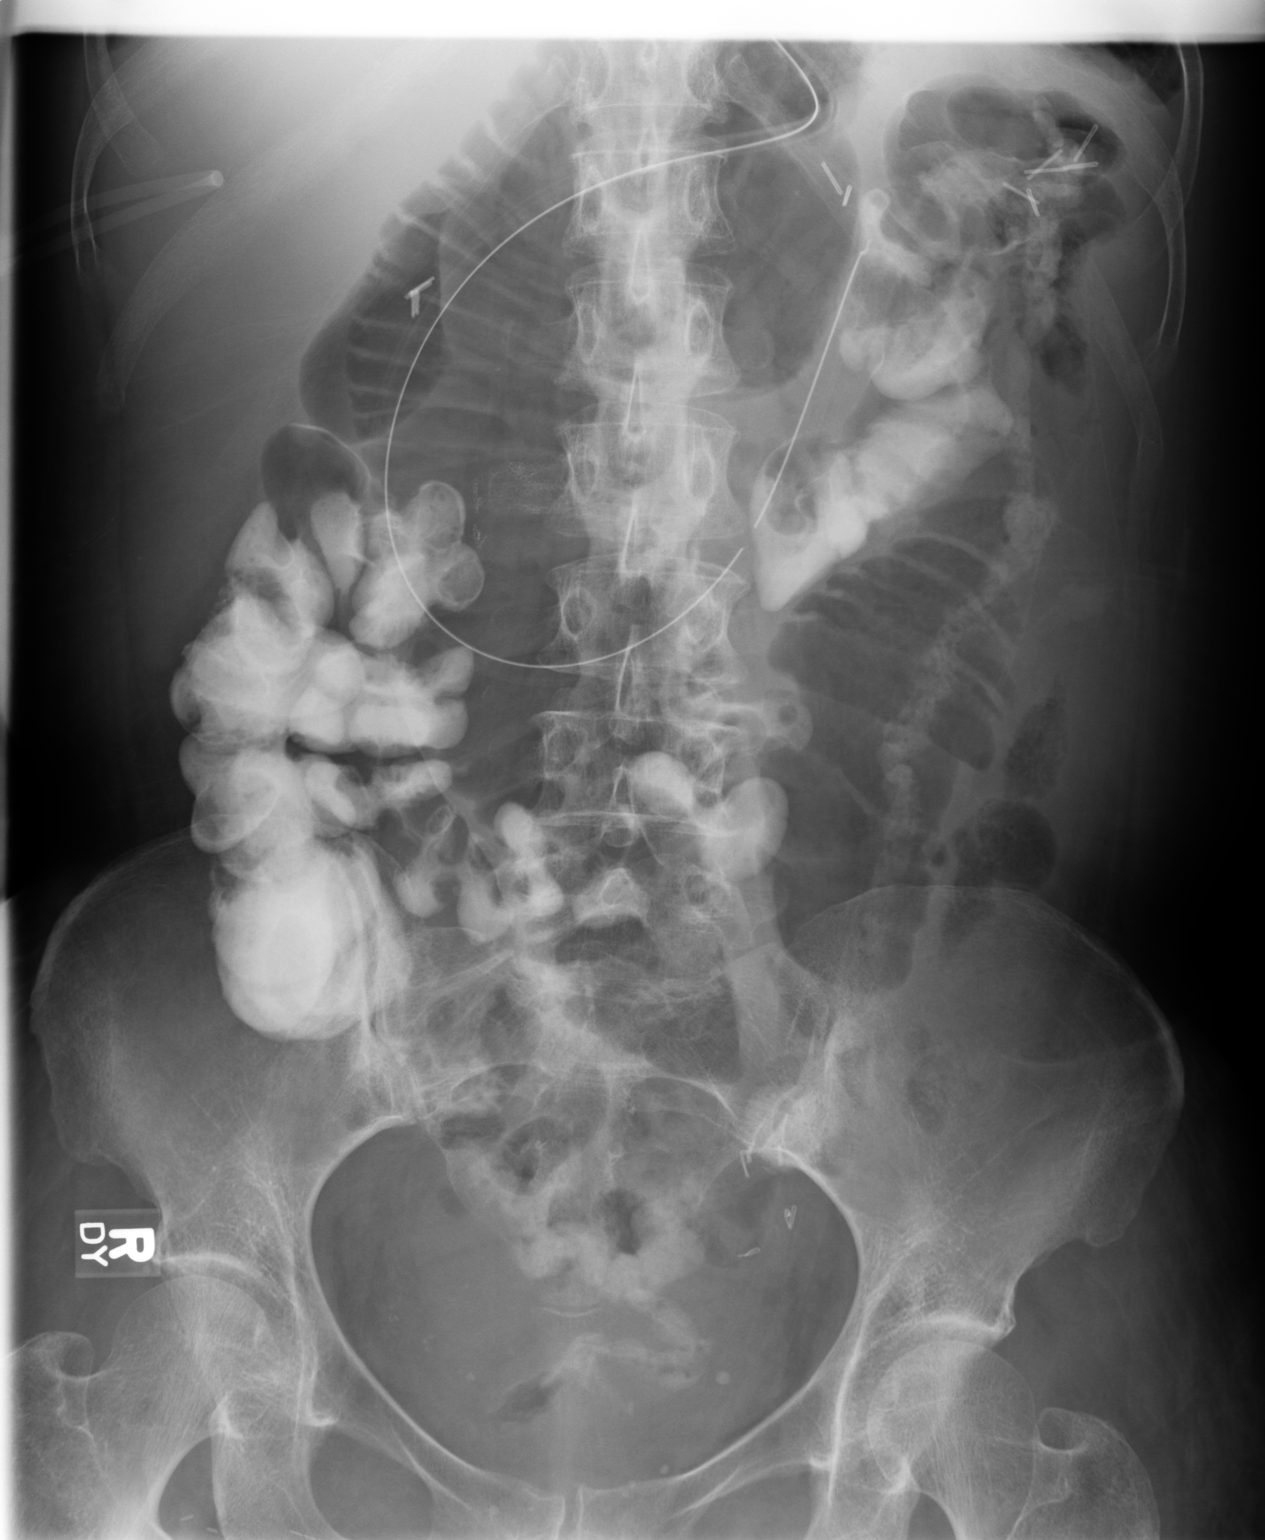

[2 of 2 positions shown; findings below may reference images not displayed]

PROCEDURE:     DXR - DXR ABDOMEN 2 V FLAT AND ERECT  - December 24, 2006  [DATE]

RESULT:

There are loops of distended small bowel containing gas present. There is
some contrast within what appears to be colonic loops. The NG tube tip lies
in the region of the distal duodenum though it could in fact be coiled
within the stomach though the stomach does not appear markedly distended.
IMPRESSION: 1. There are findings that likely reflect partial distal small bowel
obstruction. Barium has passed into the colon from the prior CT scan,
however. No extraluminal contrast is seen.
2. There is hyperinflation of the lungs and there is blunting of the
costophrenic angles consistent with bibasilar fluid. The patient has known
bibasilar atelectasis and/or infiltrate.

## 2008-07-04 IMAGING — CR DG CHEST 2V
1 series · 2 of 2 positions shown · non-contrast
Comparison: none

REASON FOR EXAM: Atelectasis vs infiltrate on CT scan
COMMENTS:

[Series 1: view not recorded · 0.17mm/px · 2 of 2 slices shown]
[im 1/2]
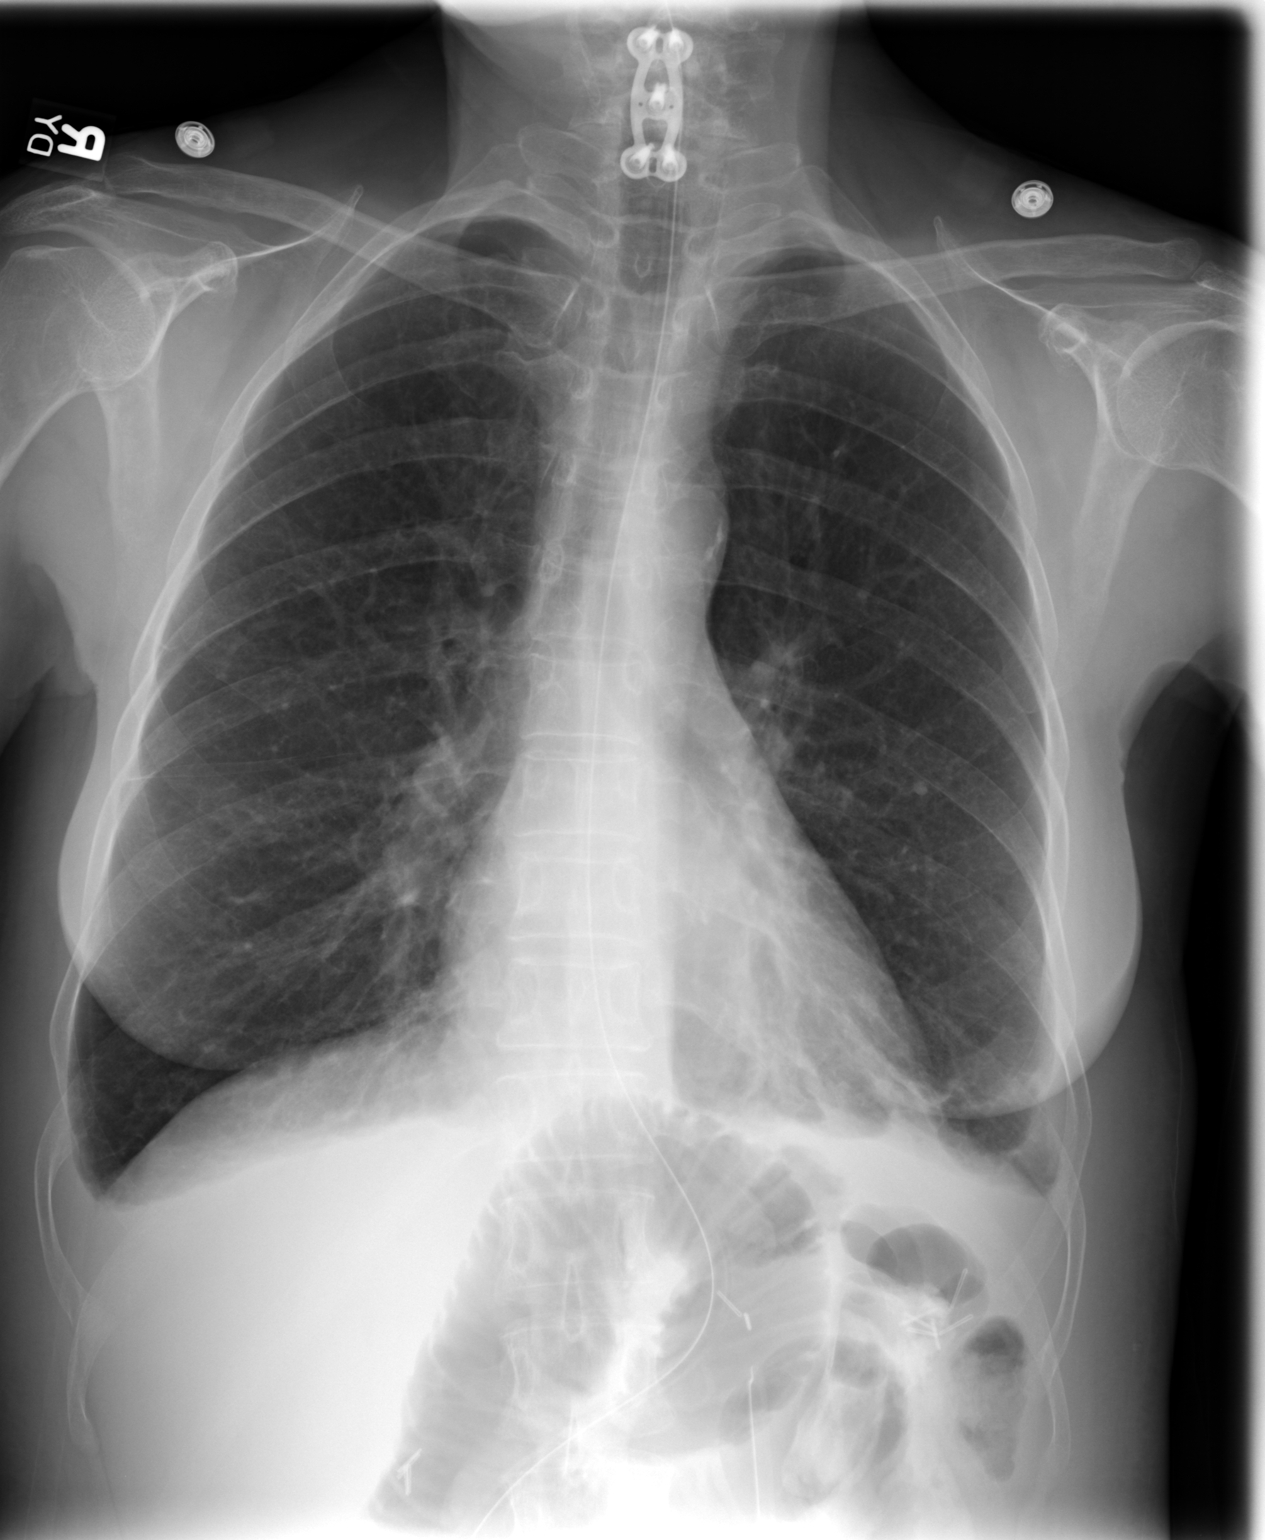
[im 2/2]
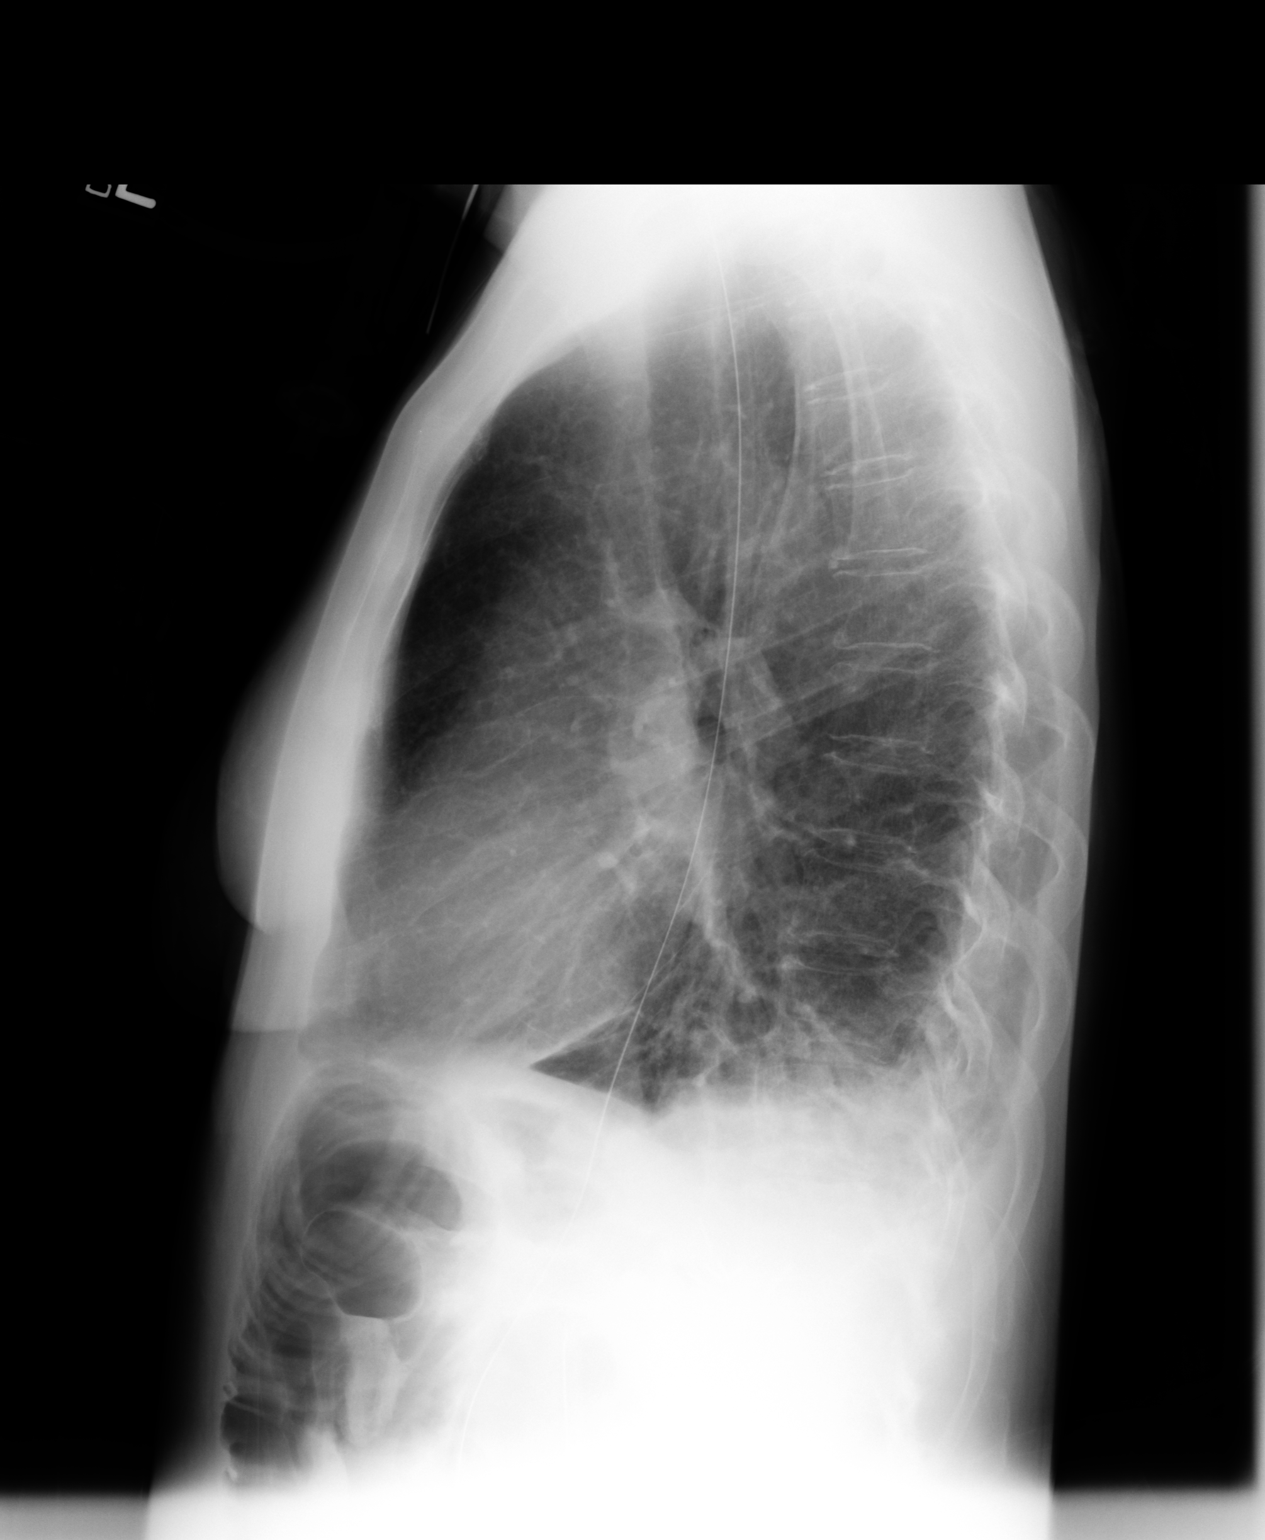

[2 of 2 positions shown; findings below may reference images not displayed]

PROCEDURE:     DXR - DXR CHEST PA (OR AP) AND LATERAL  - December 24, 2006  [DATE]

RESULT:     Comparison is made study 18 October, 2005.

The lungs are hyperinflated with hemidiaphragm flattening. There is density
at the lung bases consistent with small amounts of pleural fluid. The heart
is not enlarged. There are coarse lung markings noted in the left infrahilar
region. The mediastinum is not widened. The patient has undergone prior
cervical fusion.
IMPRESSION: There are findings consistent with COPD with superimposed
small bilateral pleural effusions and left lower lobe atelectasis or
infiltrate. Followup films are recommended. The findings here are consistent
with the findings on the CT scan and I suspect that there is bibasilar
pneumonia rather than merely atelectasis based on the CT appearance.

## 2008-07-23 IMAGING — CR DG CHEST 2V
1 series · 2 of 2 positions shown · non-contrast
Comparison: none

REASON FOR EXAM: intestinal obst,smoker
COMMENTS:

[Series 1: view not recorded · 0.17mm/px · 2 of 2 slices shown]
[im 1/2]
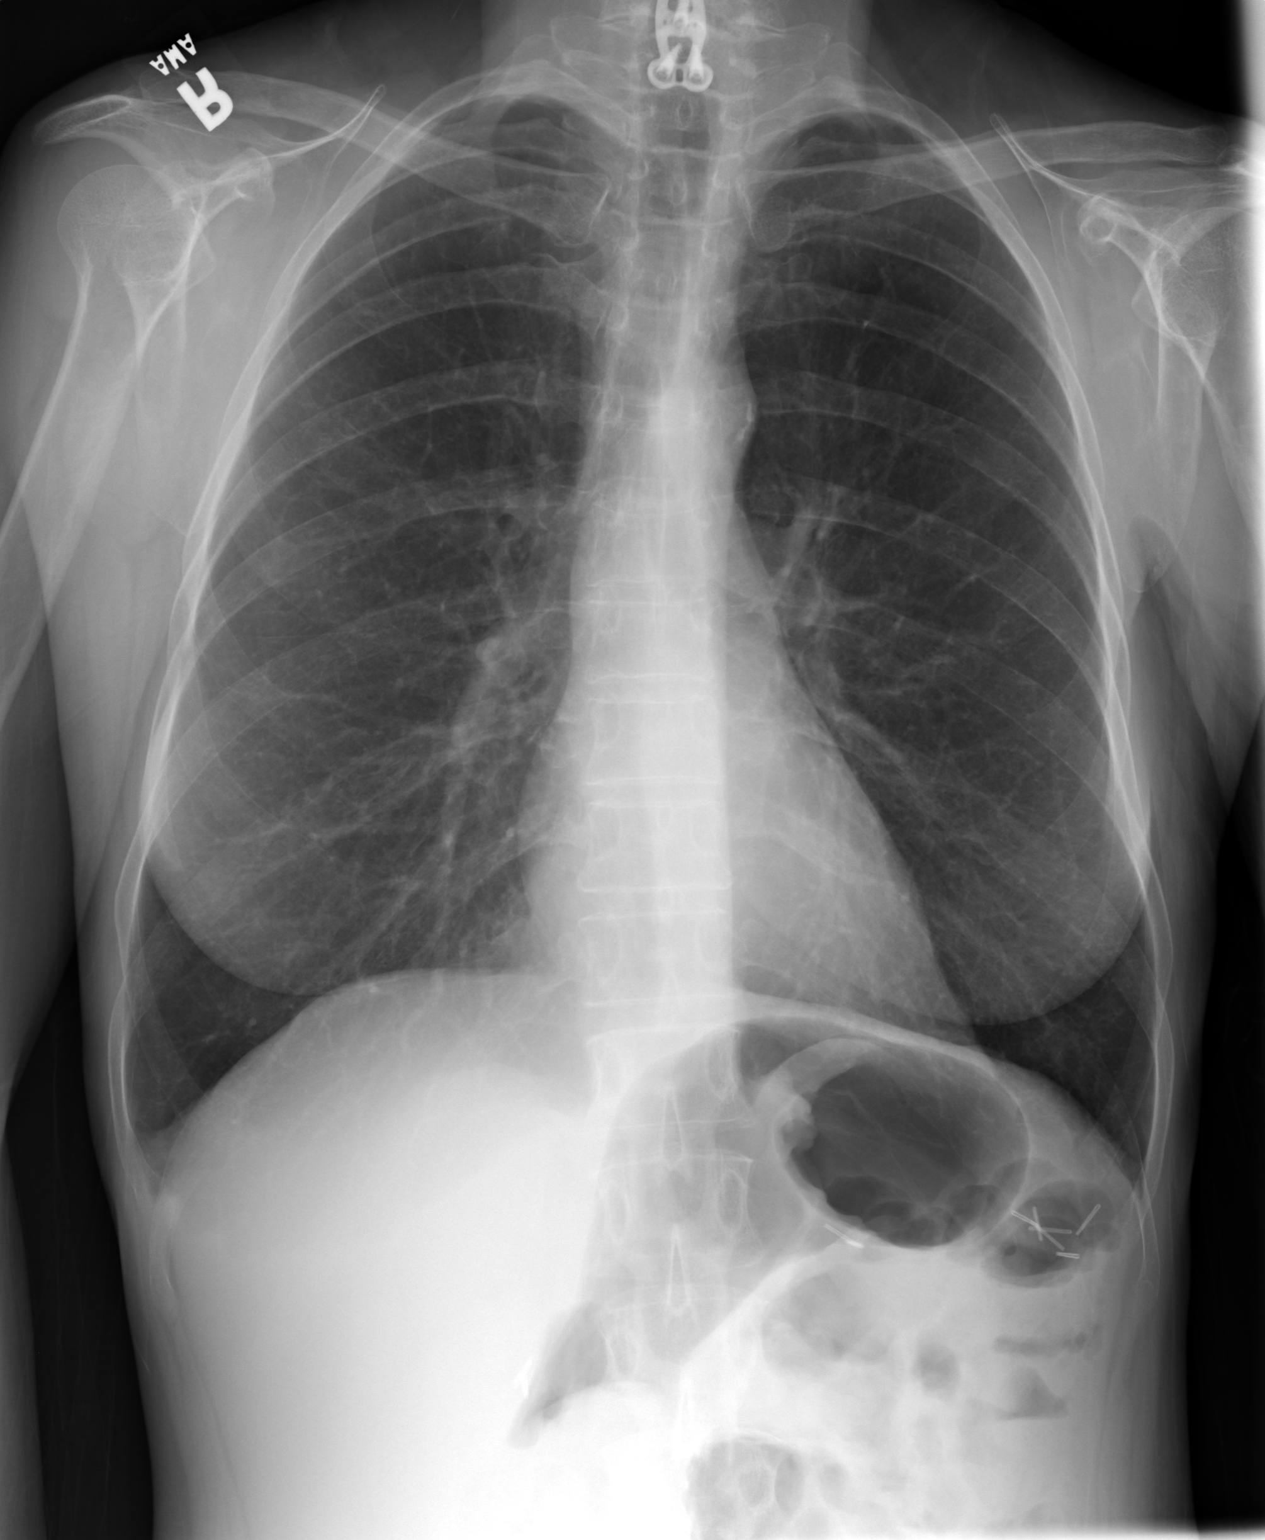
[im 2/2]
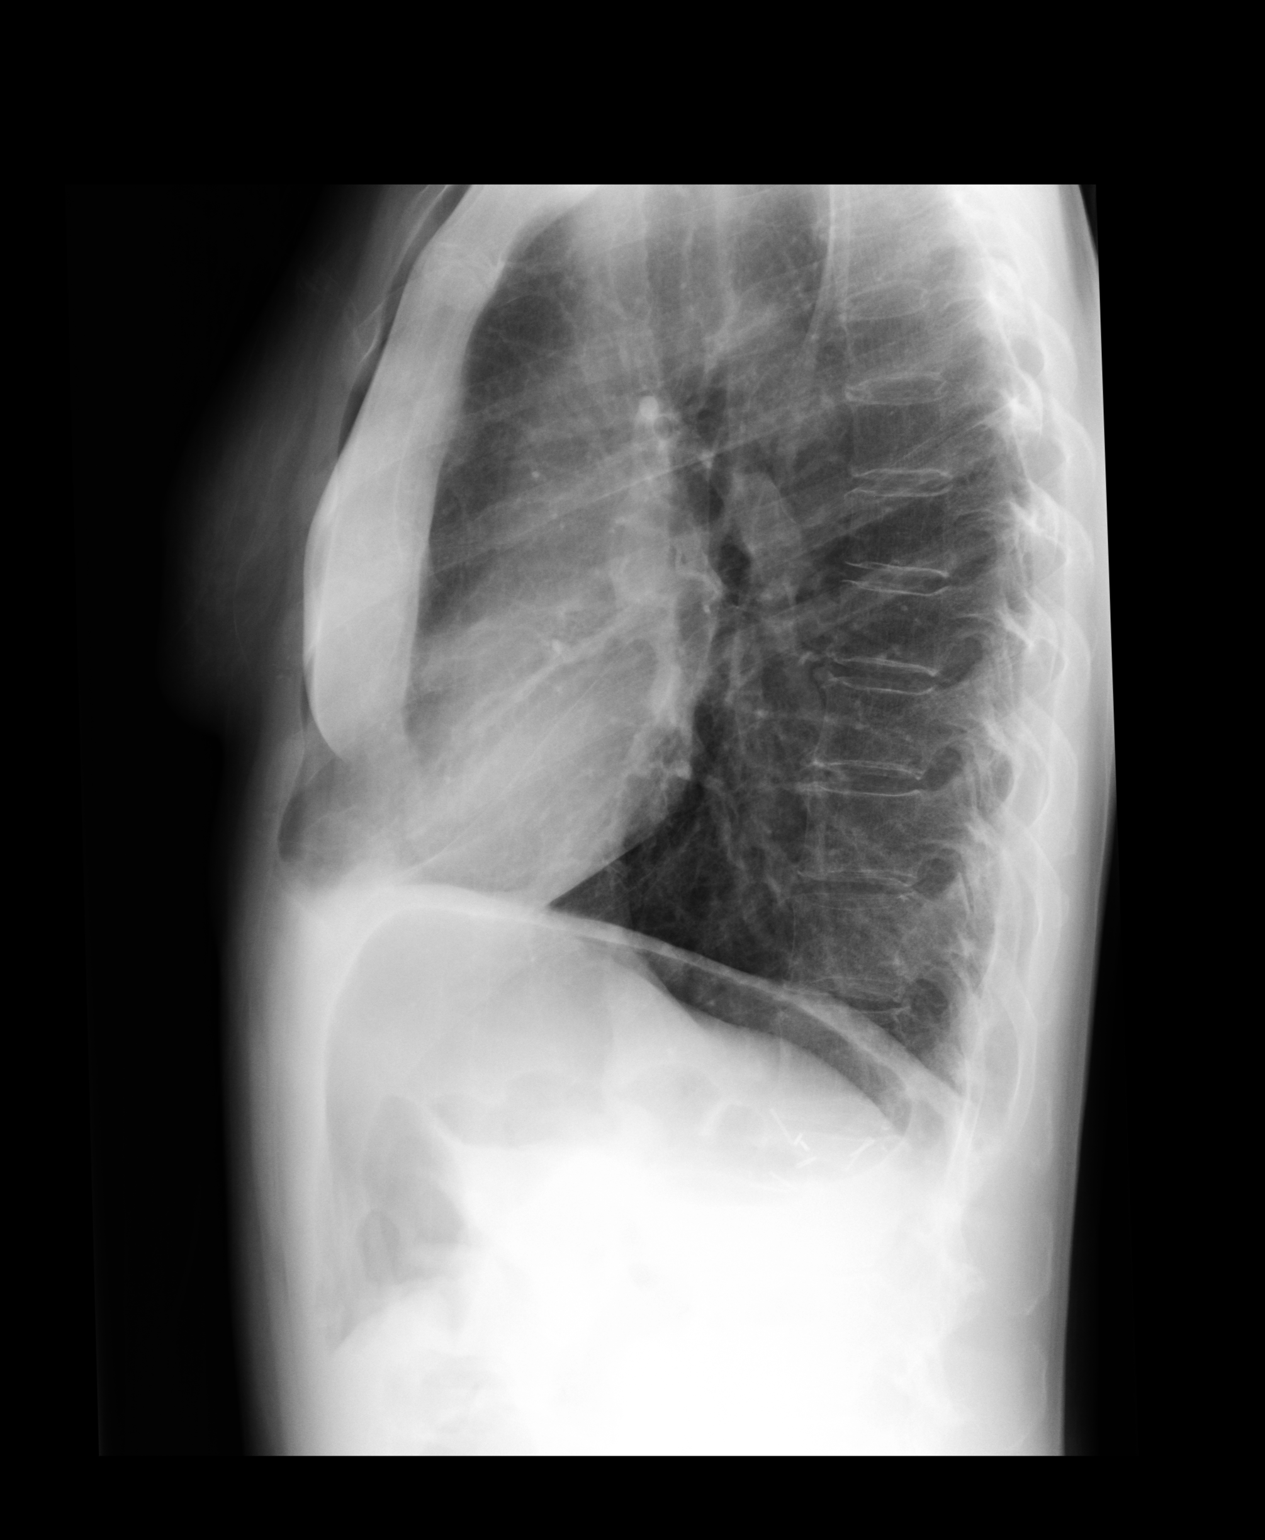

[2 of 2 positions shown; findings below may reference images not displayed]

PROCEDURE:     DXR - DXR CHEST PA (OR AP) AND LATERAL  - January 12, 2007  [DATE]

RESULT:     Changes of a previous anterior cervical fusion are noted and
were present on the study of 12/24/2006. The esophagogastric tube present at
that time has been removed. Surgical staples are seen in the left upper
quadrant. The lungs are hyperinflated but clear. The heart and pulmonary
vessels appear unremarkable.
IMPRESSION: 1. Hyperinflation consistent with COPD.
2. No acute cardiopulmonary disease.

## 2008-08-09 IMAGING — US US EXTREM LOW VENOUS*R*
1 series · 17 of 24 positions shown · non-contrast
Comparison: none

REASON FOR EXAM: Right leg swelling, evaluate for DVT, recent surgery -
laparotomy 01/19/2007
COMMENTS:

[Series 1: us extrem low venous*right* · 17 of 25 slices shown]
[im 1/25]
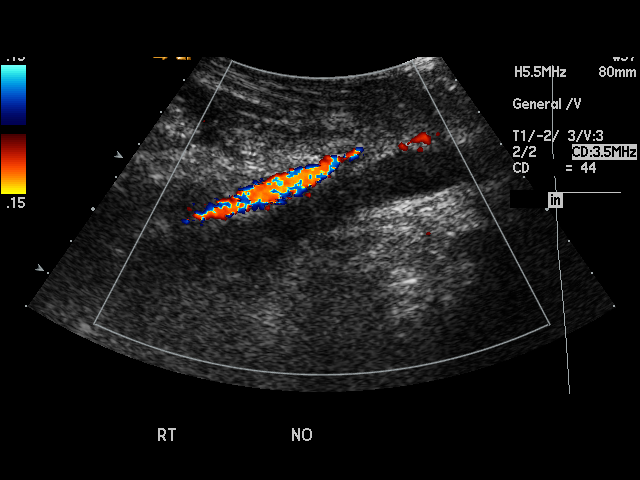
[im 3/25]
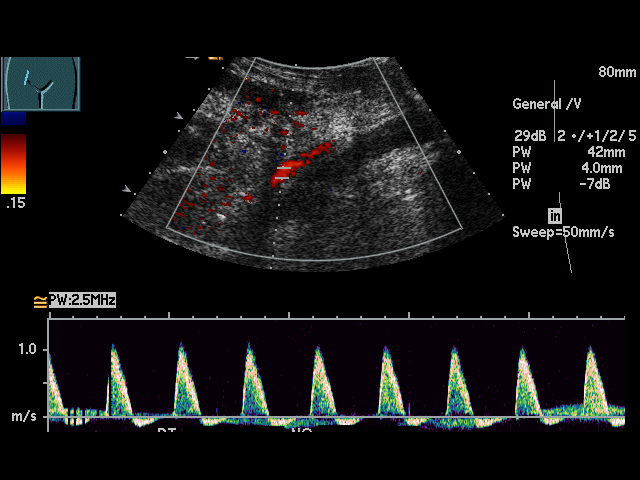
[im 4/25]
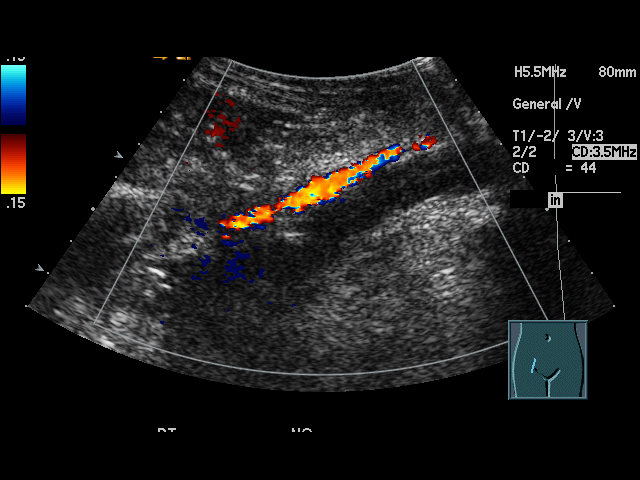
[im 5/25]
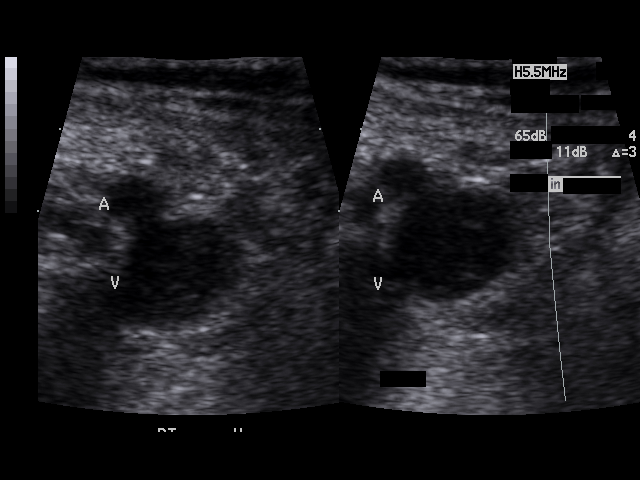
[im 7/25]
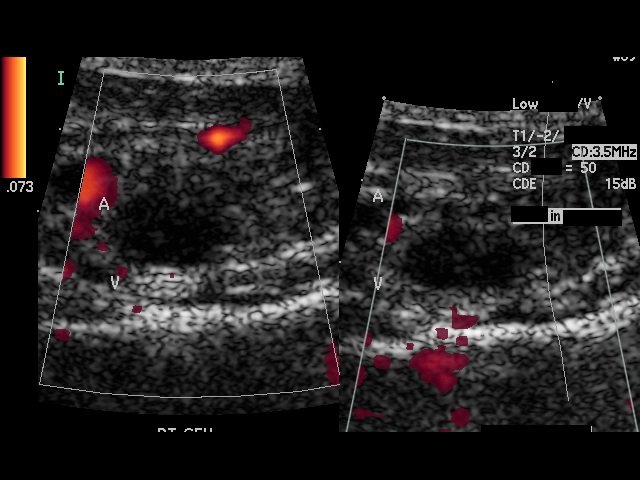
[im 8/25]
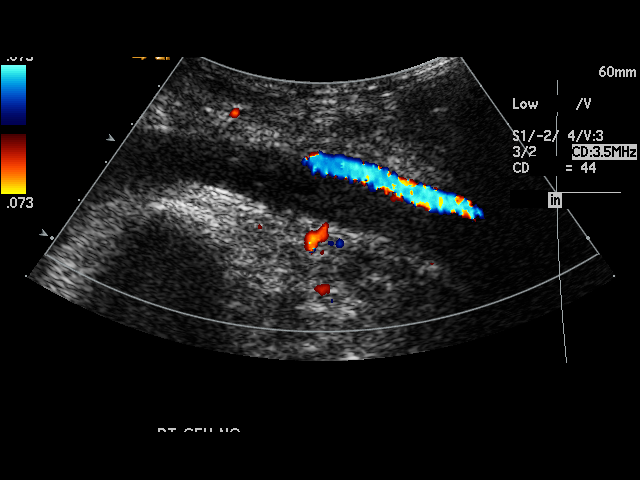
[im 10/25]
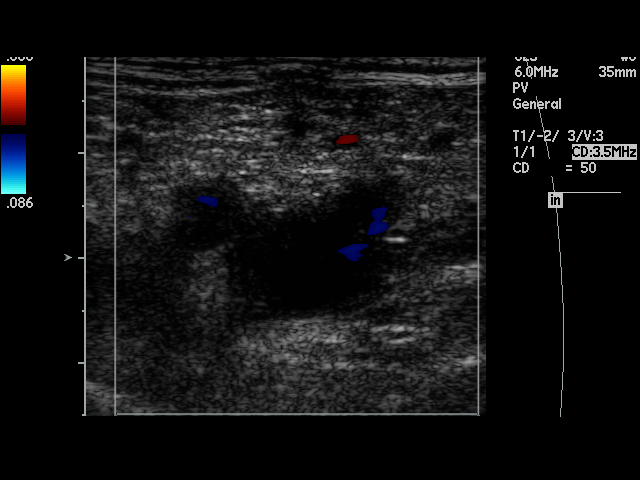
[im 11/25]
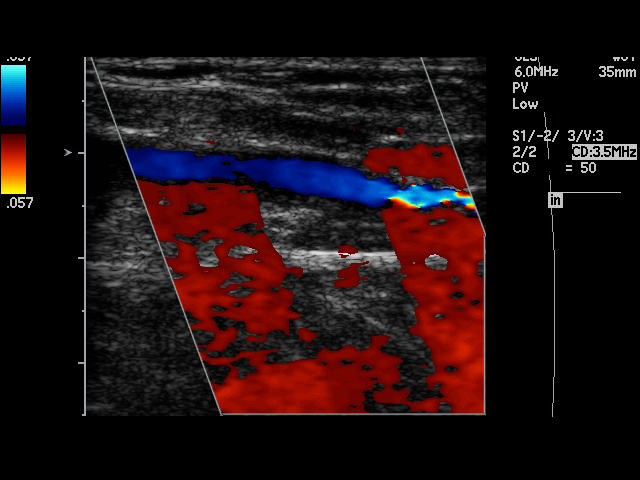
[im 13/25]
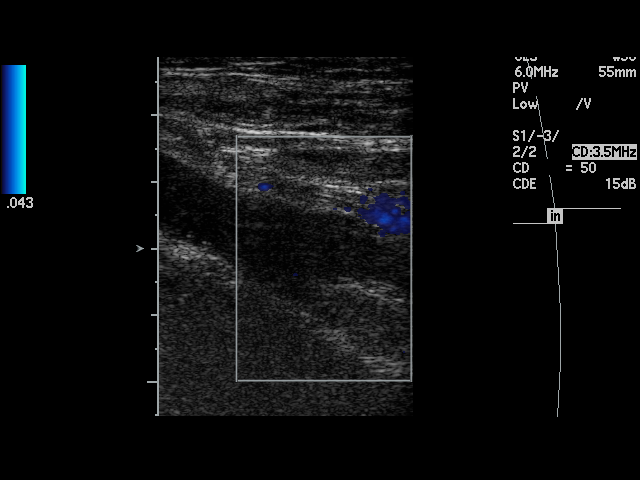
[im 14/25]
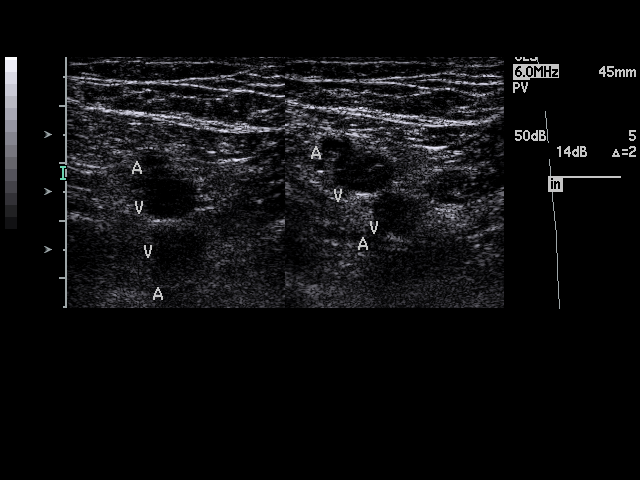
[im 15/25]
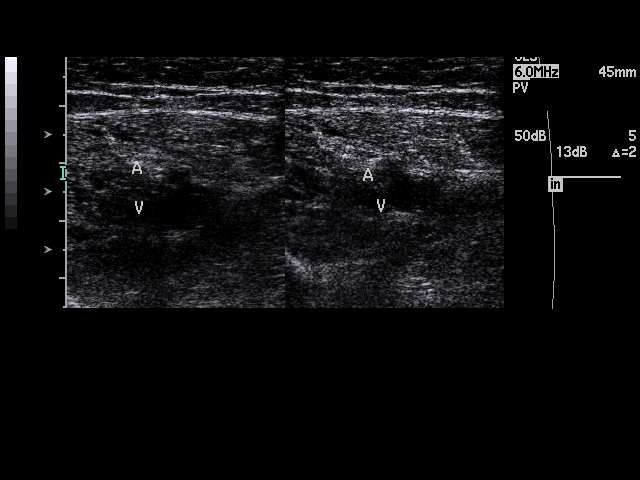
[im 17/25]
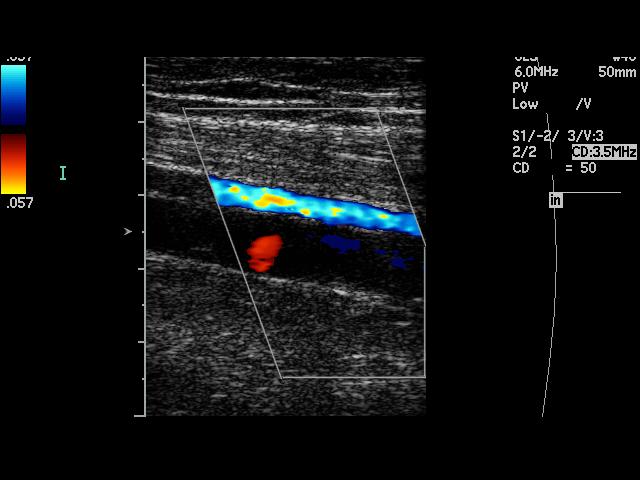
[im 18/25]
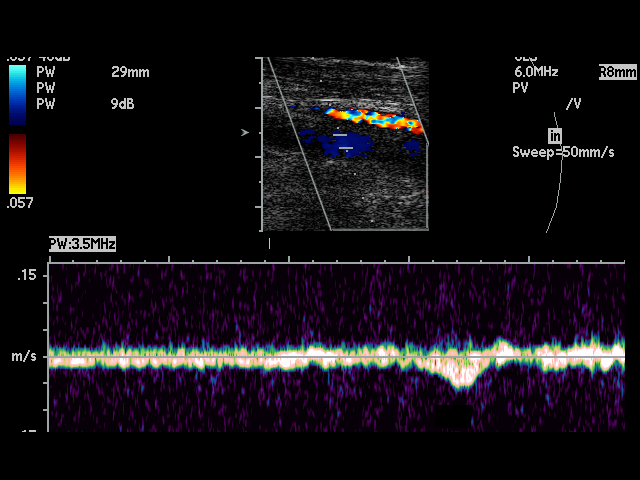
[im 20/25]
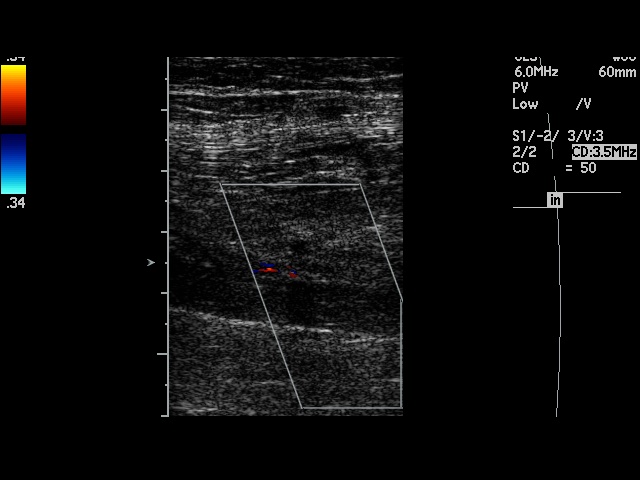
[im 21/25]
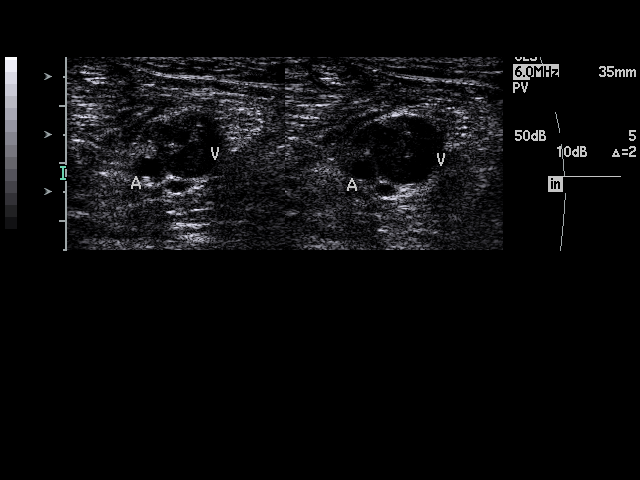
[im 22/25]
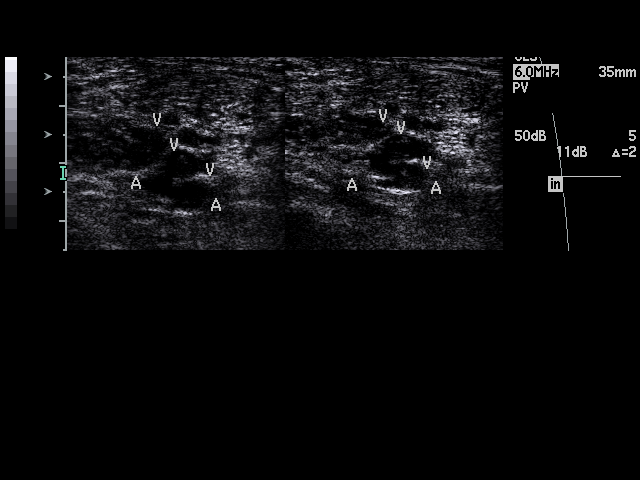
[im 25/25]
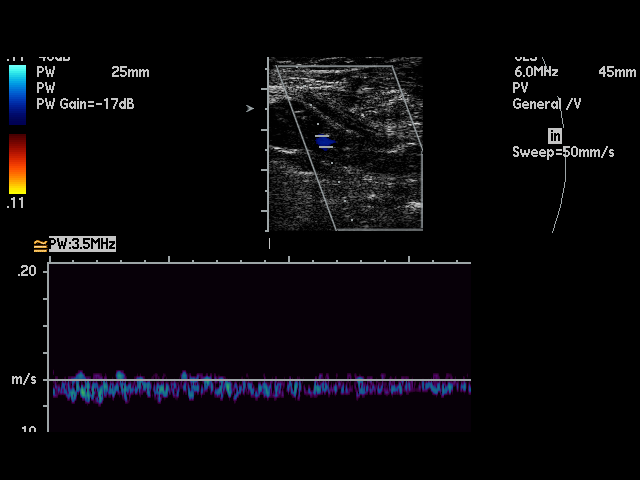

[17 of 24 positions shown; findings below may reference images not displayed]

PROCEDURE:     US  - US DOPPLER LOW EXTR RIGHT  - January 29, 2007 [DATE]

RESULT:     There is extensive deep venous thrombosis on the RIGHT extending
from below the knee to include the popliteal, superficial femoral and common
femoral veins. There is also thrombus in the proximal greater saphenous.
Thrombus is visualized as far superiorly as could be visualized
sonographically. We were unable to interrogate the inferior vena cava due to
bowel gas and bandage. Consequently, the superior extent of the thrombus is
not established on this study.
IMPRESSION: There is extensive deep venous thrombosis on the RIGHT as
described above. The superior extent of the thrombus is not visualized since
the upper common iliac and inferior vena cava is obscured by gas and bandage.

## 2008-08-10 IMAGING — XA DG OUTSIDE FILMS CHEST
3 series · 15 of 20 positions shown · non-contrast
Comparison: none

[Series 1: run · 2 of 3 slices shown (1 of 3)]
[im 1/3]
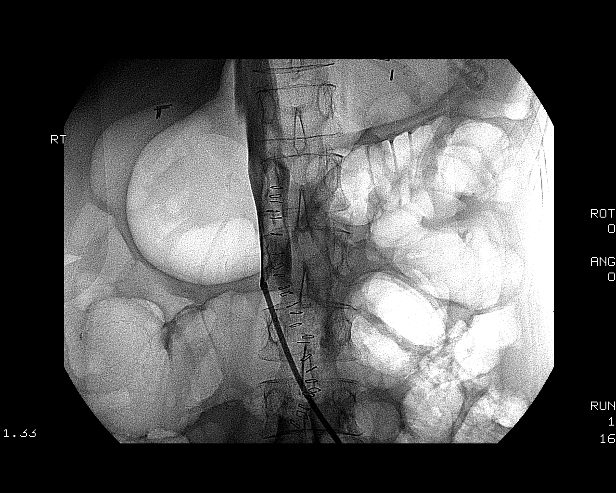
[im 3/3]
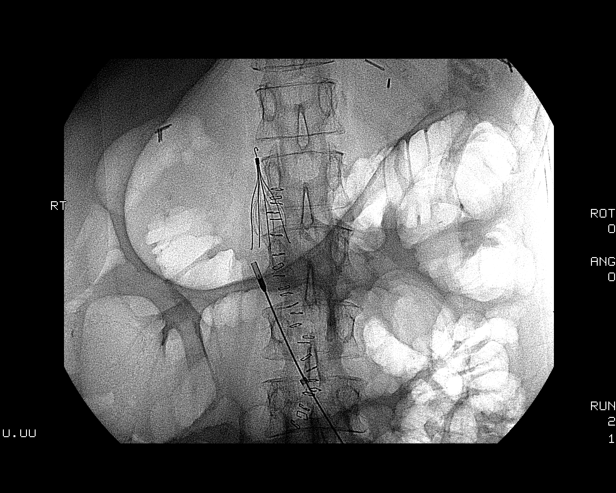

[Series 1: run · 12 of 16 slices shown (2 of 3)]
[im 1/16]
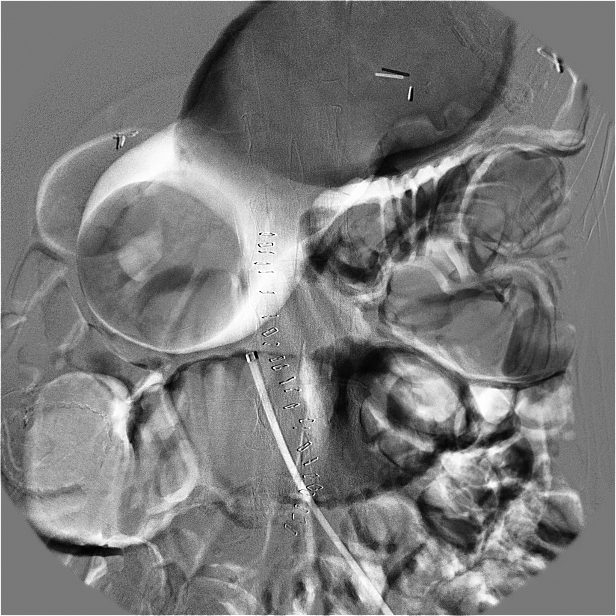
[im 2/16]
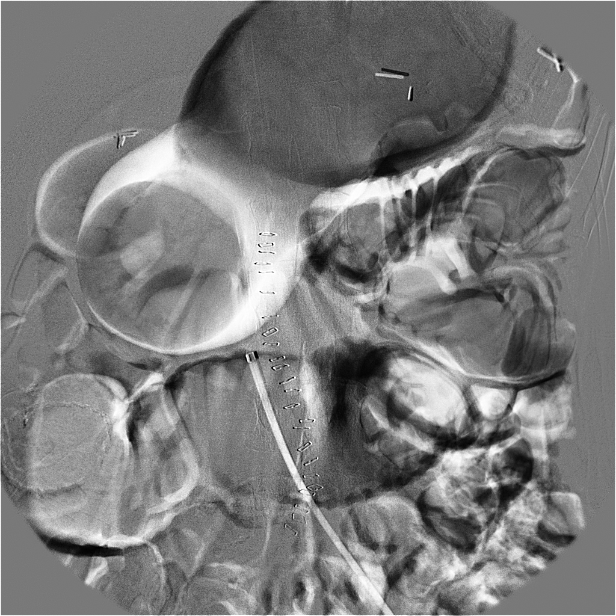
[im 4/16]
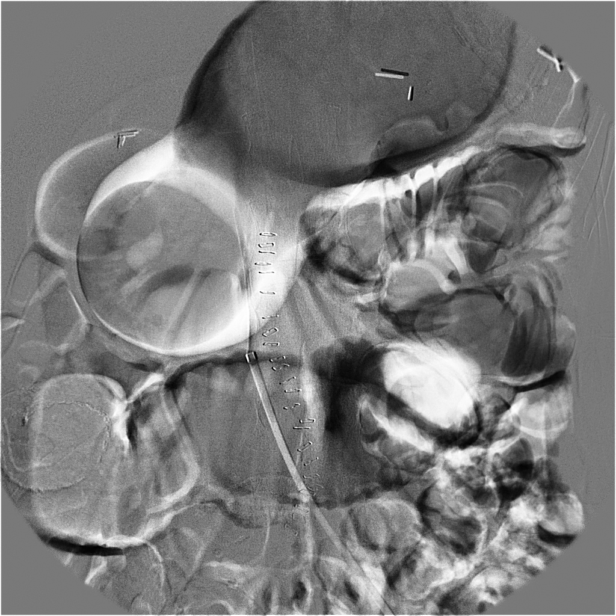
[im 5/16]
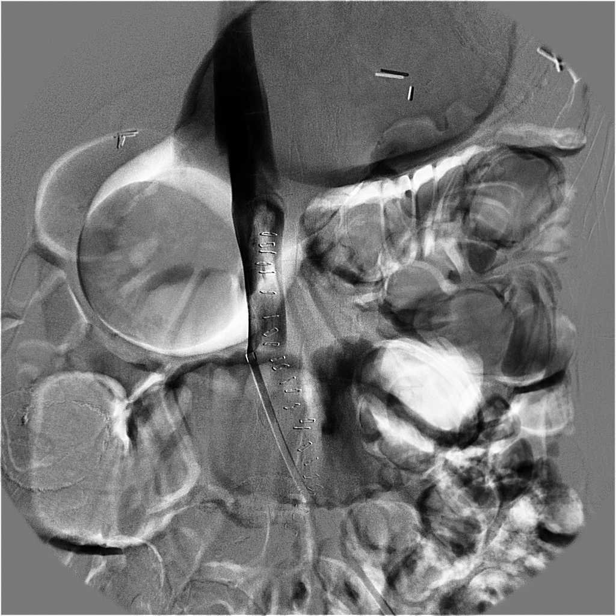
[im 6/16]
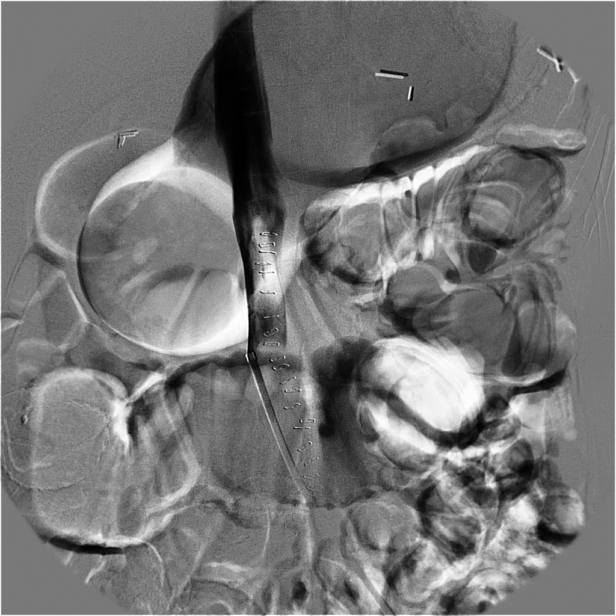
[im 8/16]
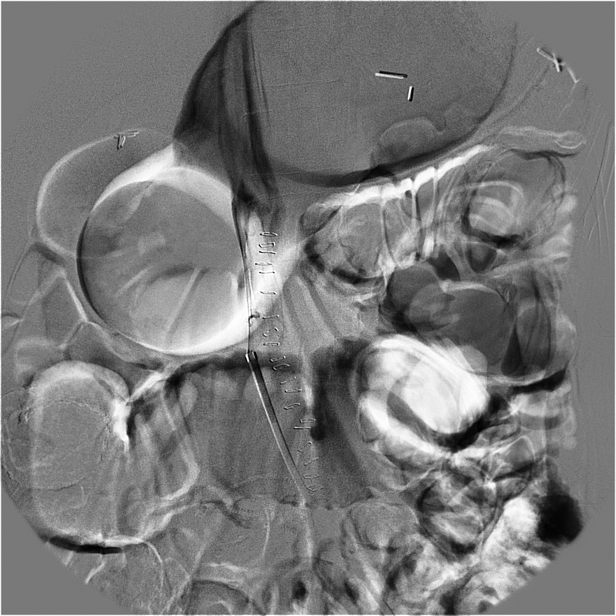
[im 9/16]
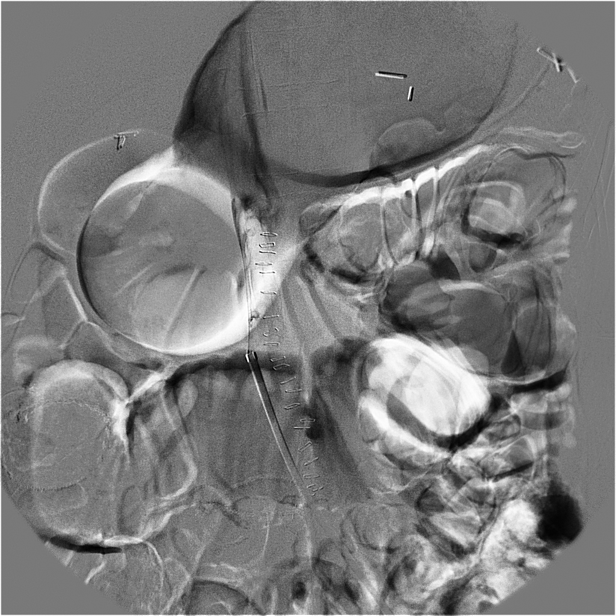
[im 10/16]
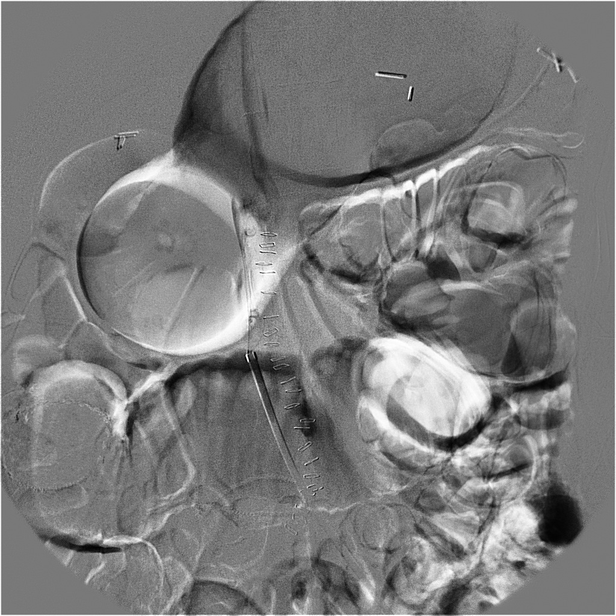
[im 12/16]
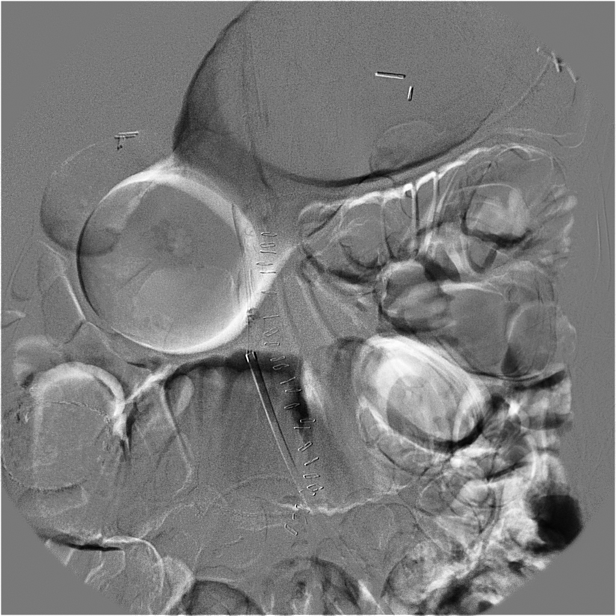
[im 13/16]
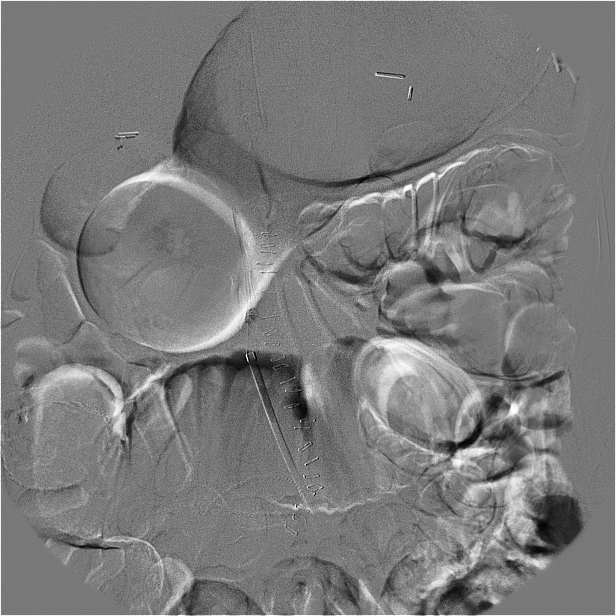
[im 14/16]
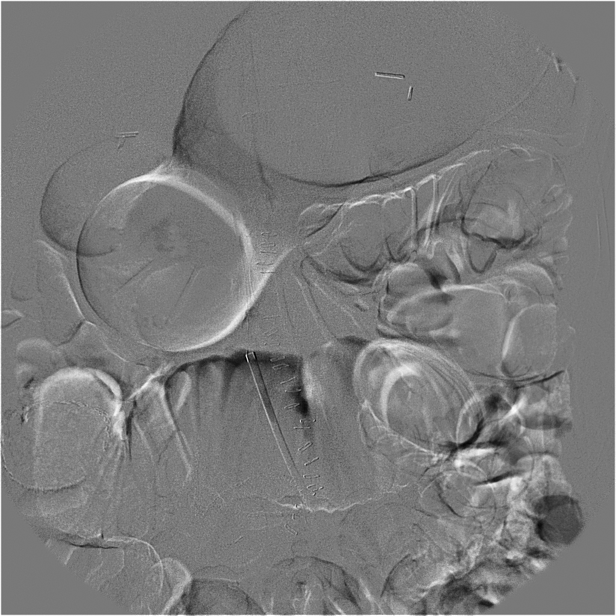
[im 16/16]
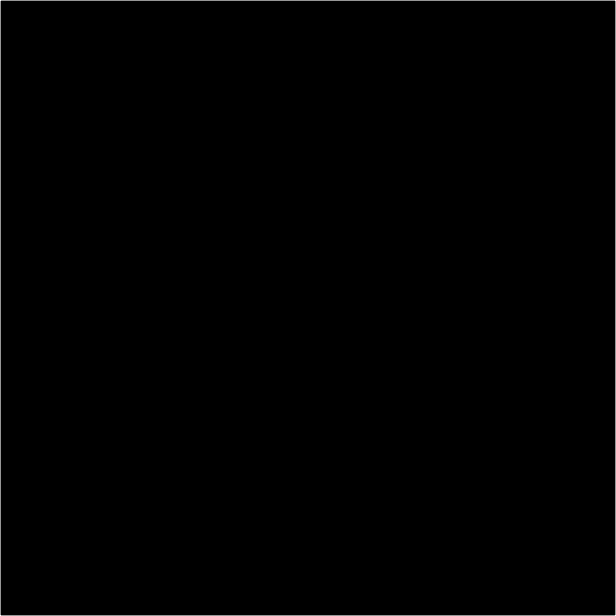

[Series 2: run · 1 of 1 slices shown (3 of 3)]
[im 1/1]
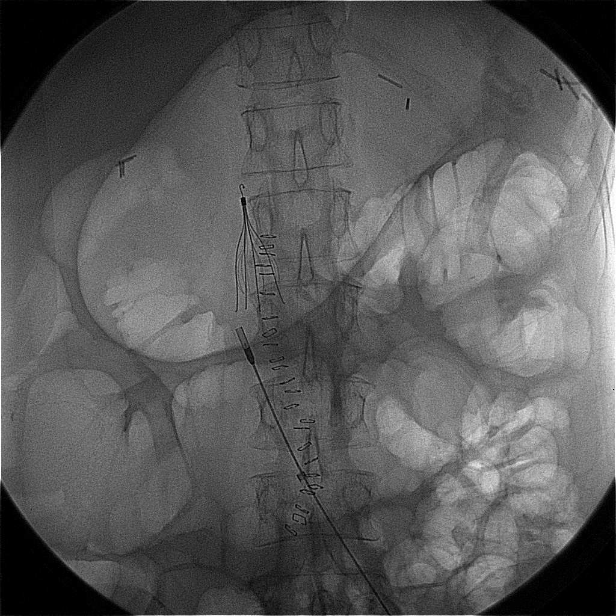

[15 of 20 positions shown; findings below may reference images not displayed]

**** An original report or order could not be provided from the [HOSPITAL] [facility] ****

## 2008-08-13 IMAGING — CR DG UGI W/ SMALL BOWEL
2 series · 3 of 3 positions shown · non-contrast
Comparison: none

REASON FOR EXAM: Abdominal distension, nausea
COMMENTS:

[Series 1: run · 2 of 2 slices shown]
[im 1/2]
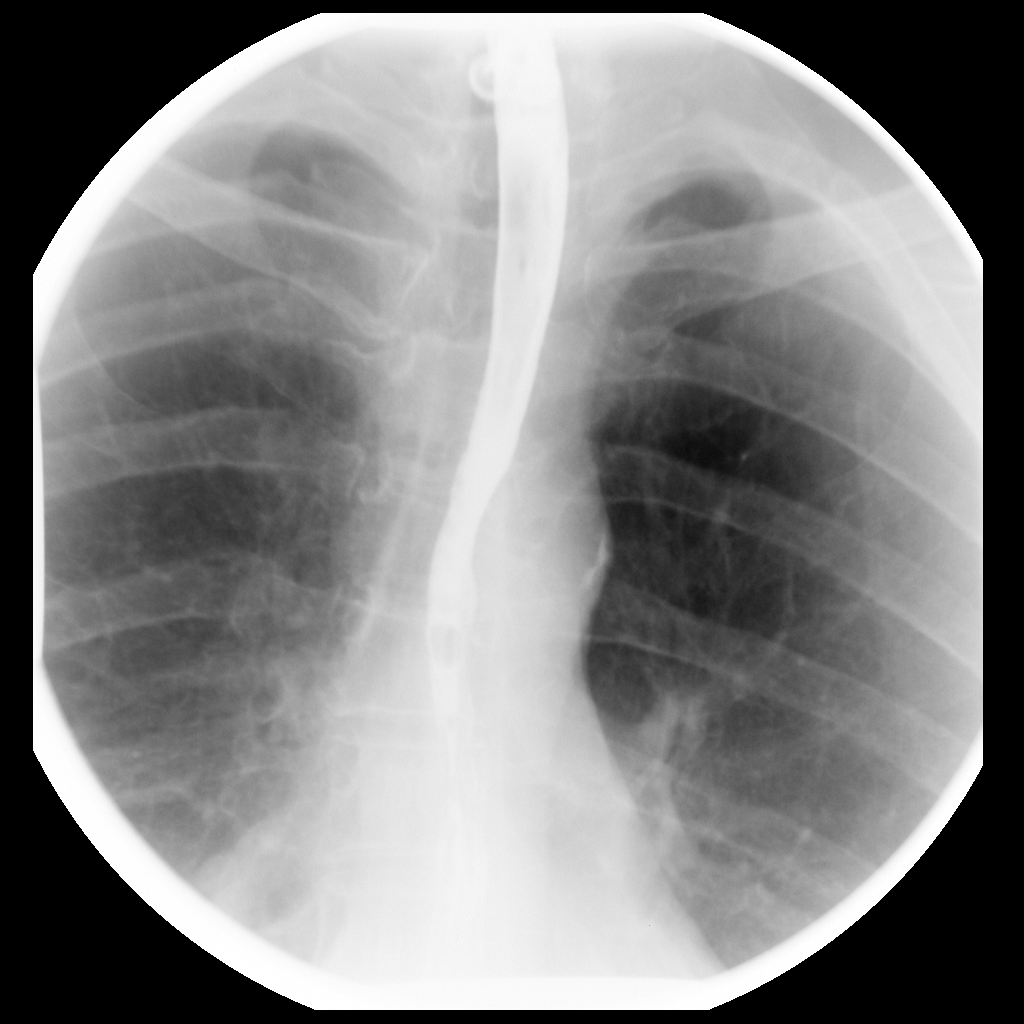
[im 2/2]
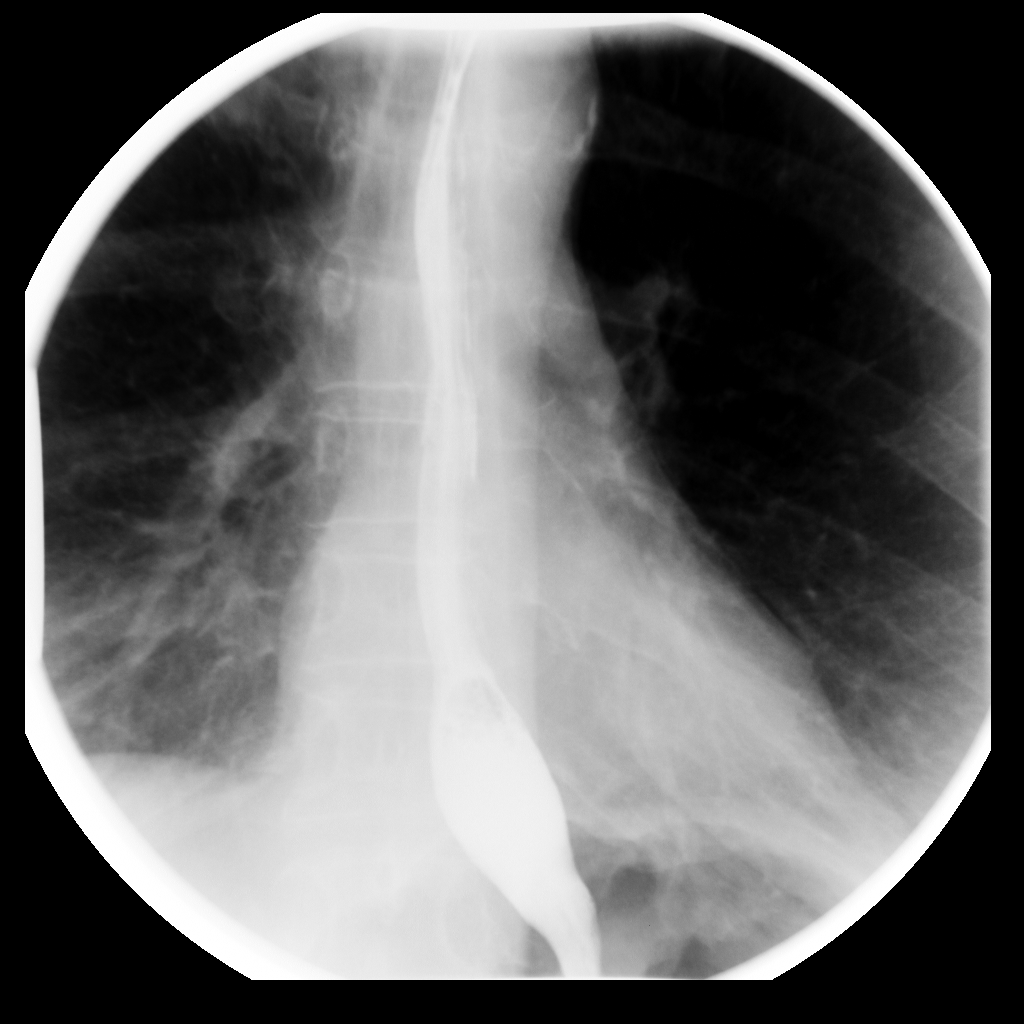

[view not recorded]
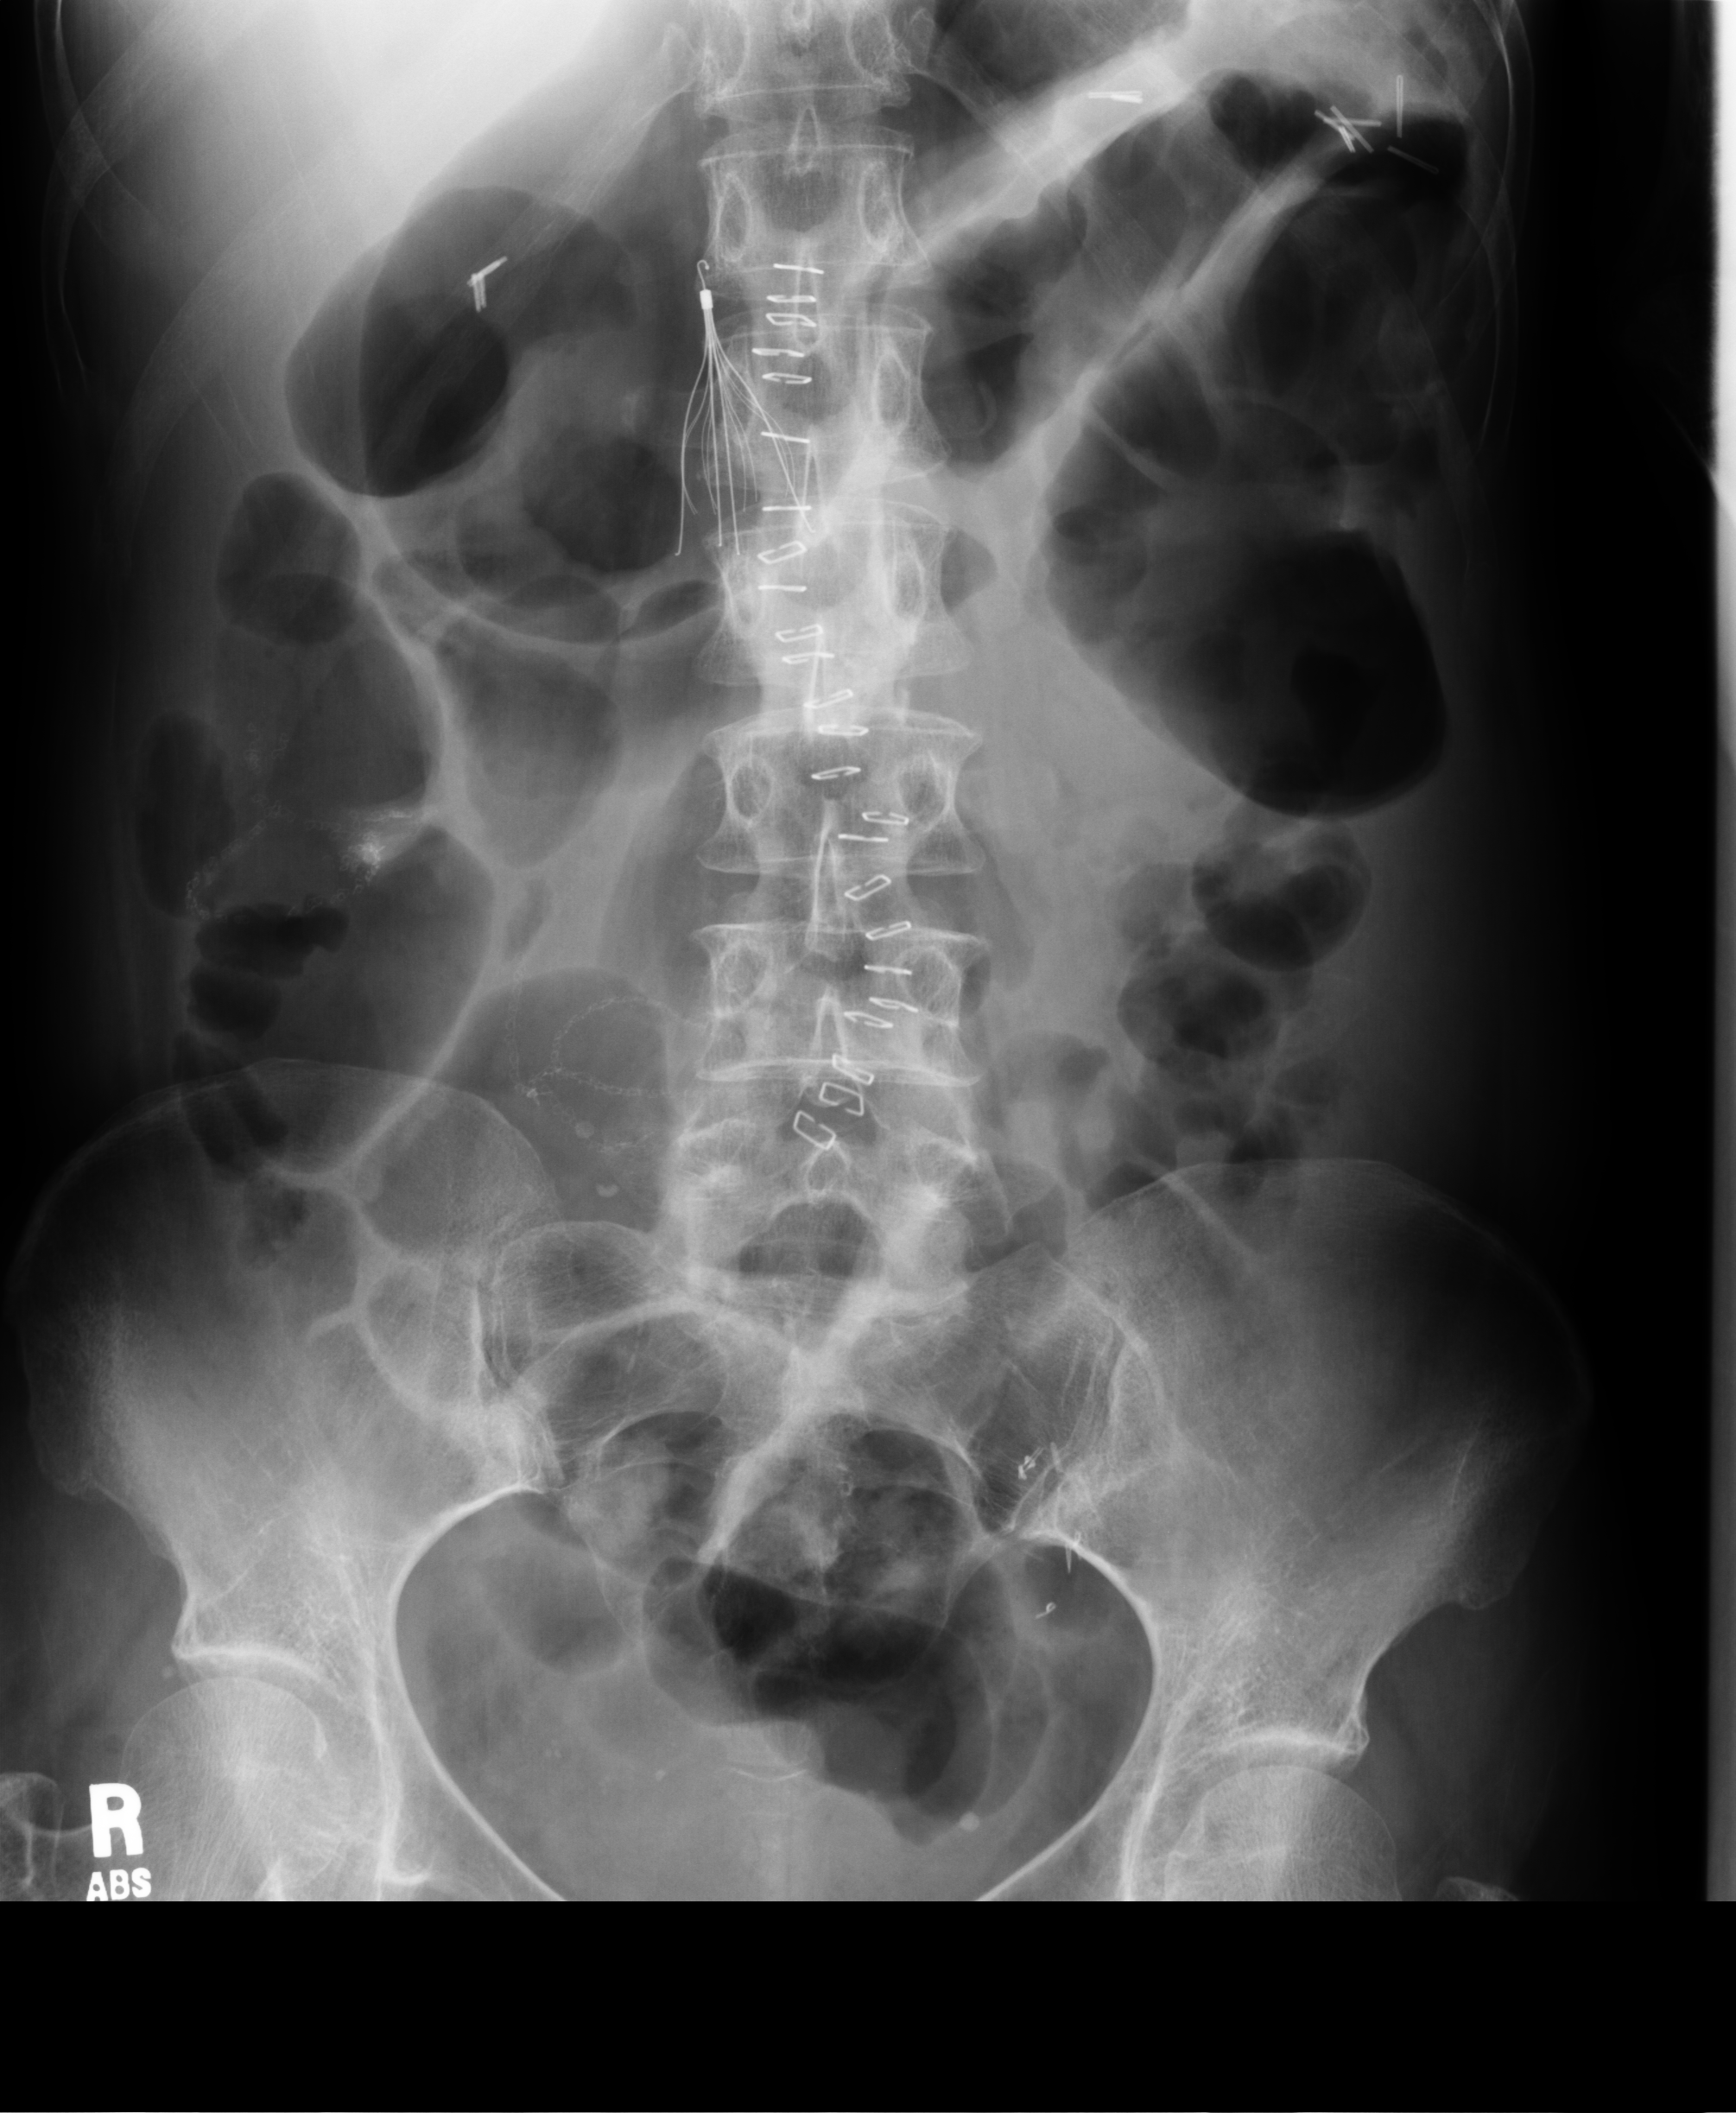

[3 of 3 positions shown; findings below may reference images not displayed]

PROCEDURE:     FL  - FL UPPER GI WITH SMALL BOWEL  - February 02, 2007  [DATE]

RESULT:     Upper GI was attempted. The patient could only swallow a small
amount of barium and could not tolerate any more. Upper GI and small bowel
follow through could not be obtained. The esophagus is patent. Scout film
reveals inferior vena caval filter and surgical clips throughout the
abdomen.
IMPRESSION: The patient could not tolerate Upper GI examination. The
patient could not swallow the barium well without becoming nauseous. The
esophagus appears patent. When the patient's nausea subsides, we will be
glad to reattempt Upper GI/small bowel follow through examination.

## 2008-10-09 IMAGING — MR MRI LUMBAR SPINE WITHOUT CONTRAST
4 of 5 series · 26 of 48 positions shown · non-contrast
Comparison: none

REASON FOR EXAM: Low back pain
COMMENTS:

PROCEDURE:     MR  - MR LUMBAR SPINE WO CONTRAST  - March 31, 2007  [DATE]
RESULT:     Multiplanar/multisequence imaging of the lumbar spine is
obtained.

[Series 2: T2 · sagittal · 4.0mm · 0.78mm/px · 7 of 14 slices shown (1 of 2)]
[im 1/14]
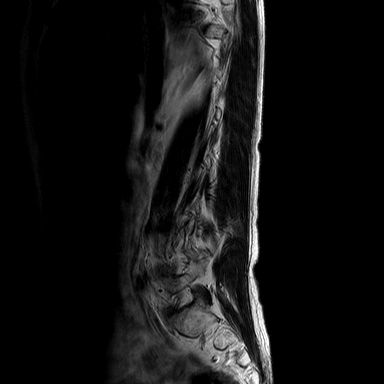
[im 3/14]
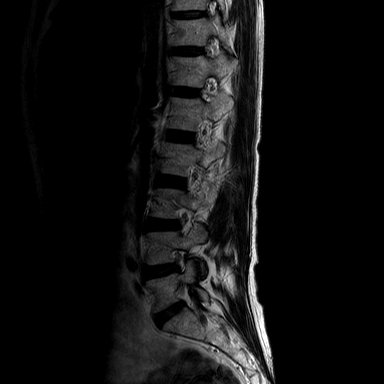
[im 5/14]
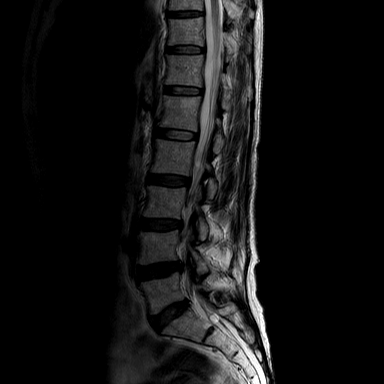
[im 7/14]
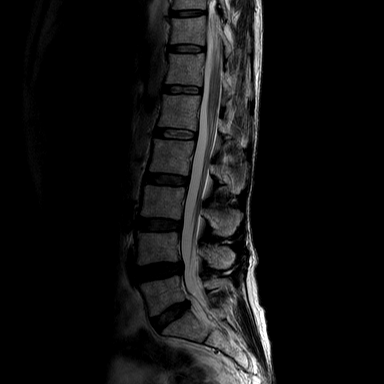
[im 9/14]
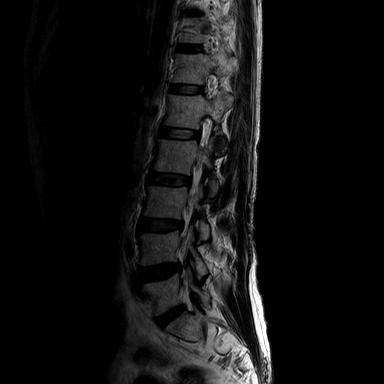
[im 11/14]
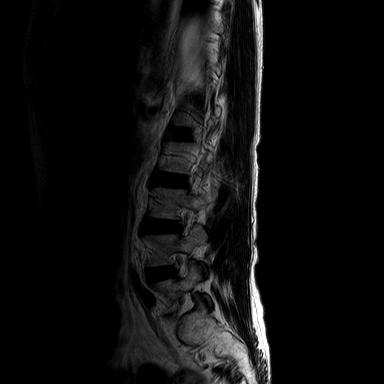
[im 14/14]
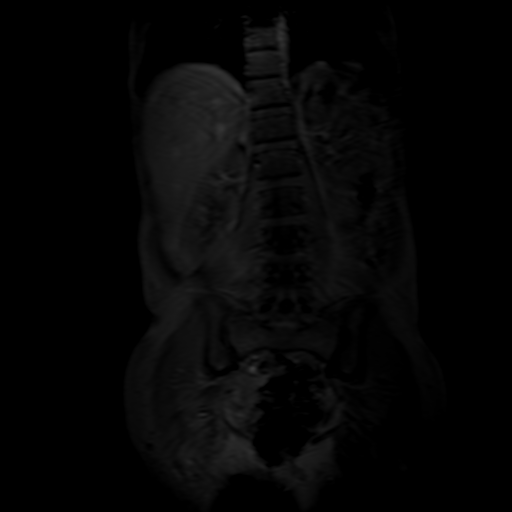

[Series 3: T1 · sagittal · 4.0mm · 0.59mm/px · 7 of 14 slices shown (1 of 2)]
[im 1/14]
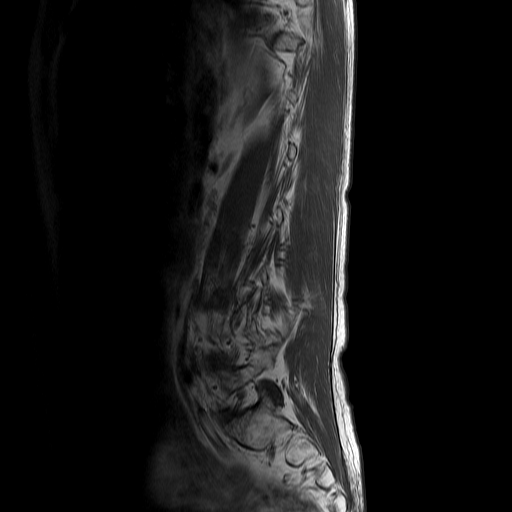
[im 3/14]
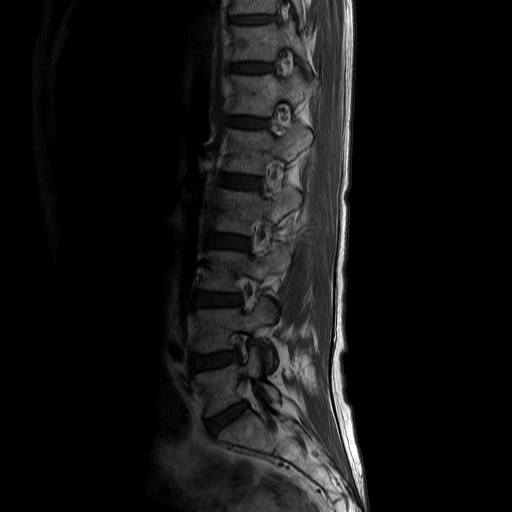
[im 5/14]
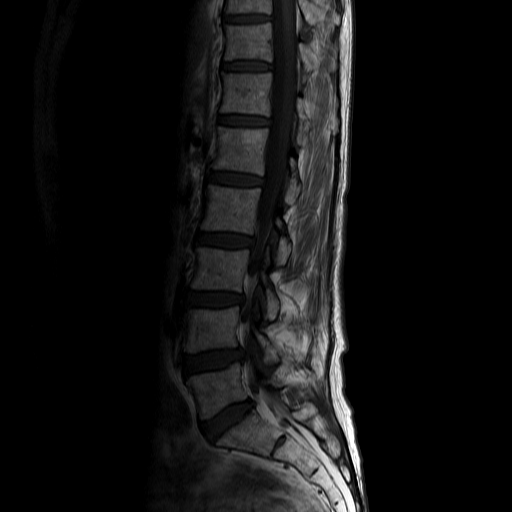
[im 7/14]
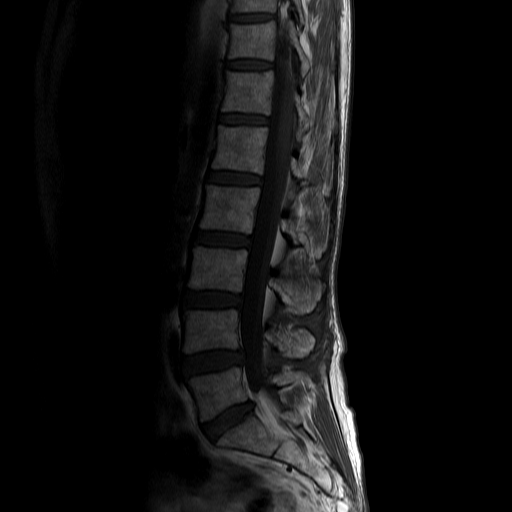
[im 9/14]
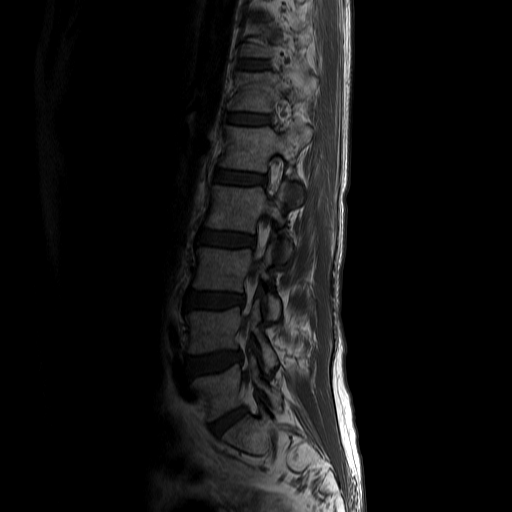
[im 11/14]
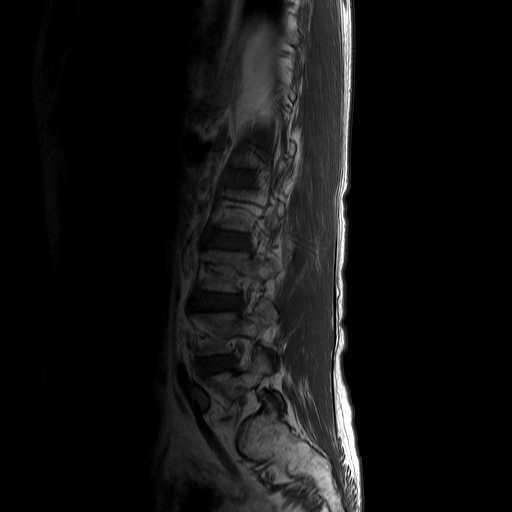
[im 14/14]
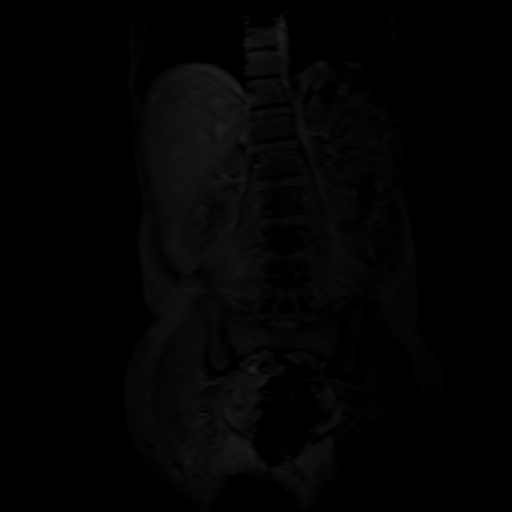

[Series 5004: T2 · axial · 5.0mm · 0.52mm/px · z∈[-113,+174]mm · 8 of 32 slices shown (2 of 2)]
[im 1/32]
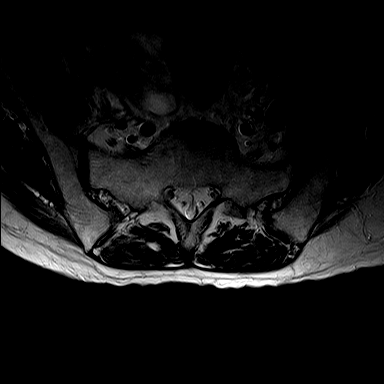
[im 5/32]
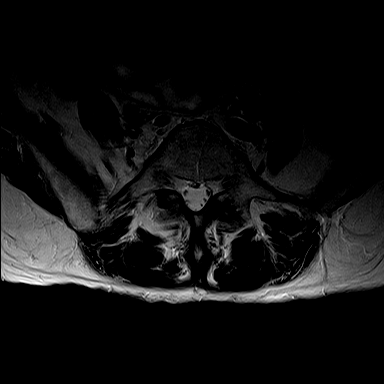
[im 10/32]
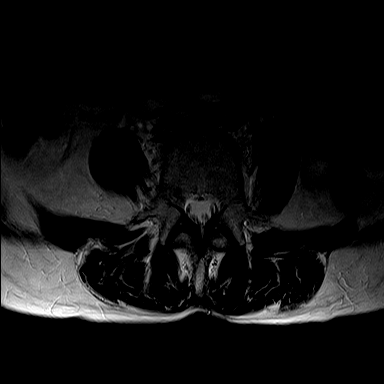
[im 15/32]
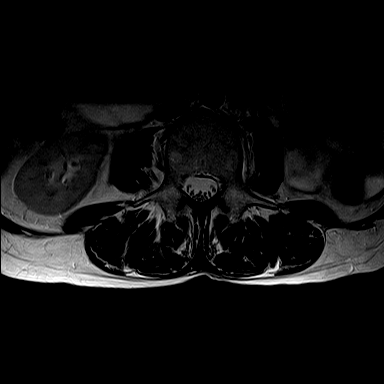
[im 17/32]
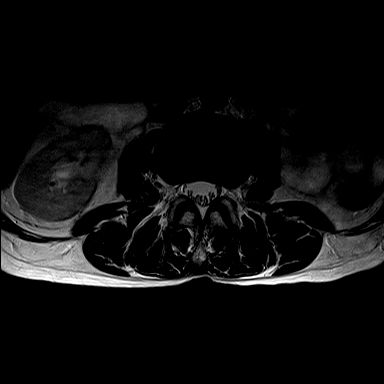
[im 22/32]
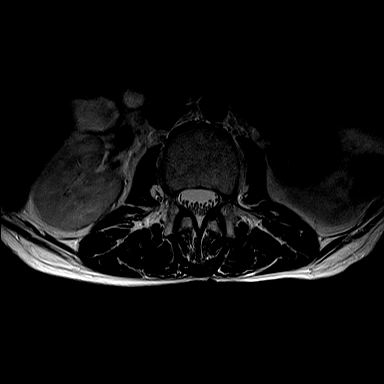
[im 27/32]
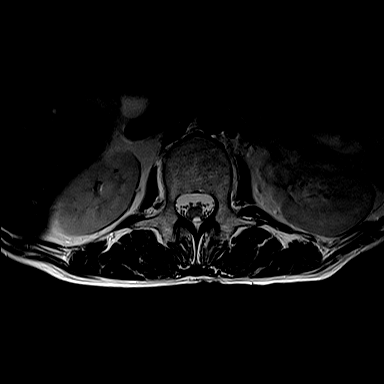
[im 32/32]
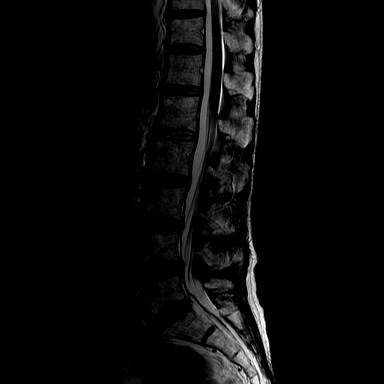

[Series 5007: T1 · axial · 5.0mm · 0.39mm/px · z∈[-113,+88]mm · 4 of 32 slices shown (2 of 2)]
[im 1/32]
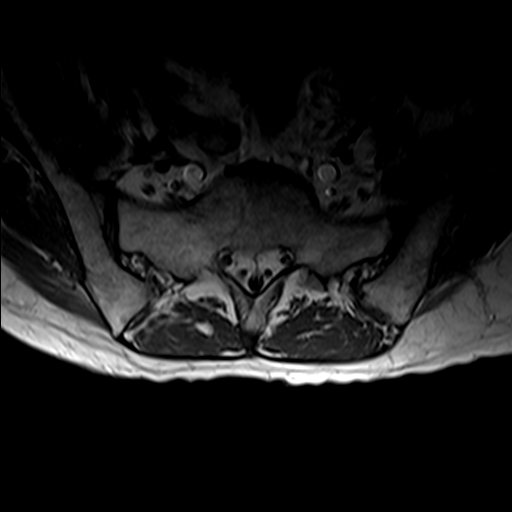
[im 5/32]
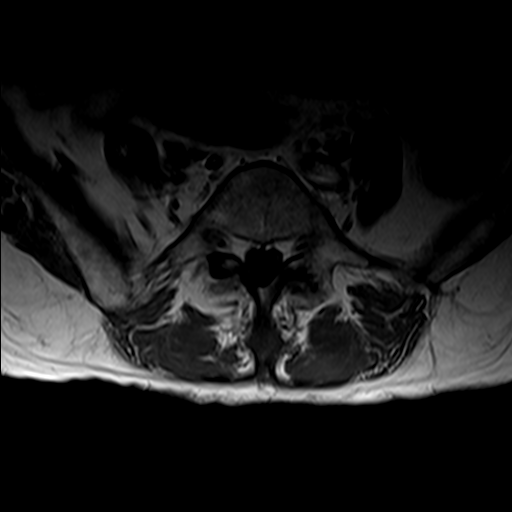
[im 17/32]
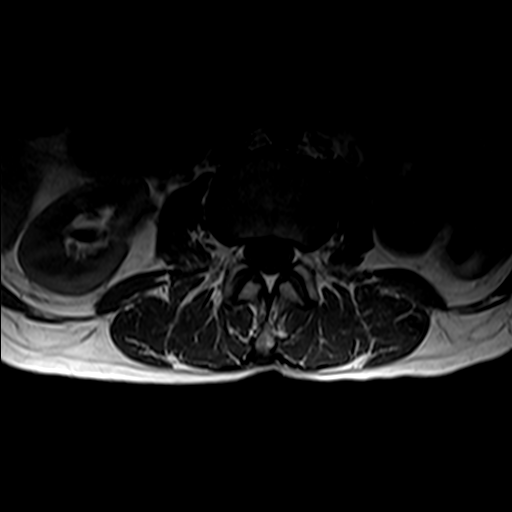
[im 27/32]
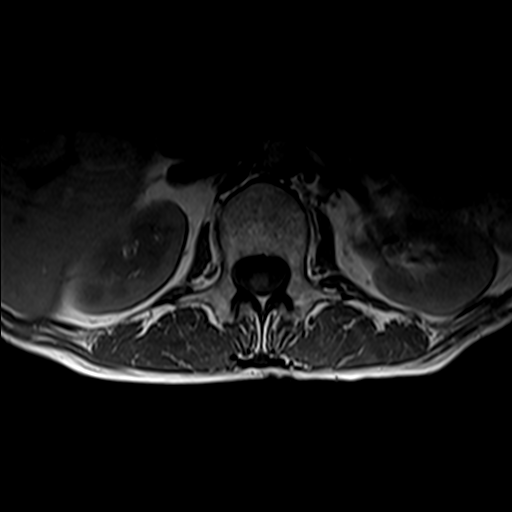

[26 of 48 positions shown; findings below may reference images not displayed]

FINDINGS: There is multilevel disc degeneration. Multiple annular bulges are
present at multiple levels. There are no prominent disc protrusions. There
is a tiny disc protrusion at L5-S1. The neural foramina and nerve roots are
normal in appearance. The bony structures are normal. The lumbar cord is
normal. The paraspinal tissues are normal.
IMPRESSION: Multilevel disc degeneration without significant disc
protrusion or spinal stenosis. The neural foramina are patent.

## 2008-10-25 IMAGING — CT CT CHEST W/ CM
1 series · 15 of 32 positions shown, 19 images · non-contrast
Comparison: none

REASON FOR EXAM: Abdominal pain, eval PE  call  report    NEW Doctor  Dr
Keyli Tiger  PH  [phone]...
COMMENTS:

[Series 4: soft tissue · axial · 0.65mm/px · z∈[-66,+214]mm · 15 of 104 slices shown, 19 images]
[im 7/104  soft-tissue]
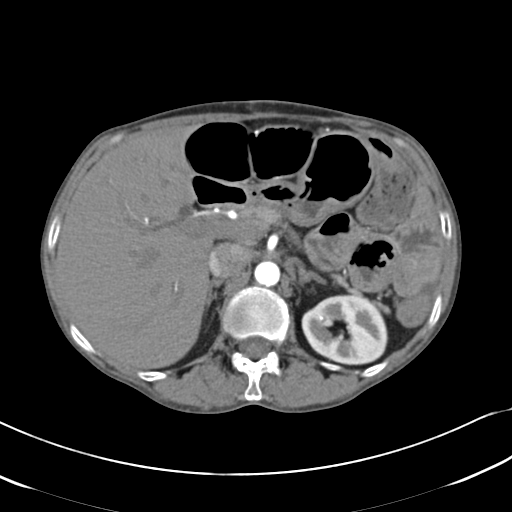
[im 7/104  bone]
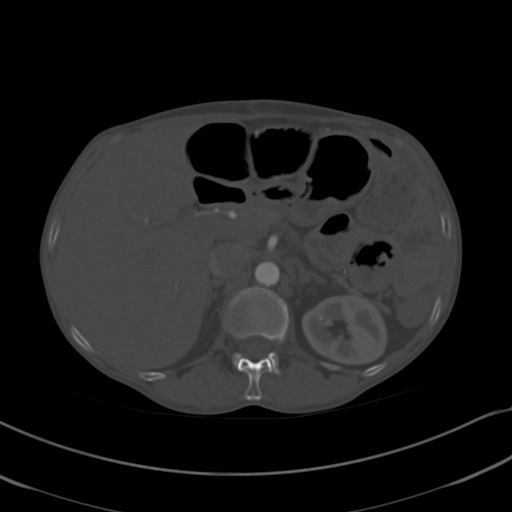
[im 14/104  soft-tissue]
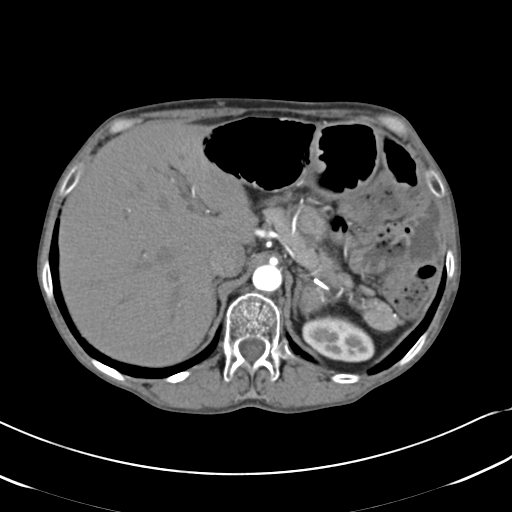
[im 20/104  soft-tissue]
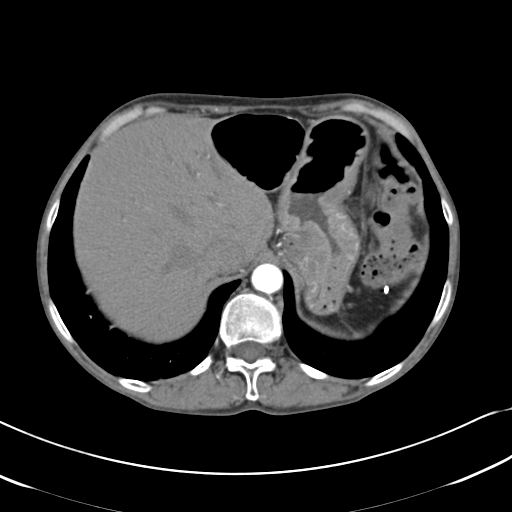
[im 30/104  soft-tissue]
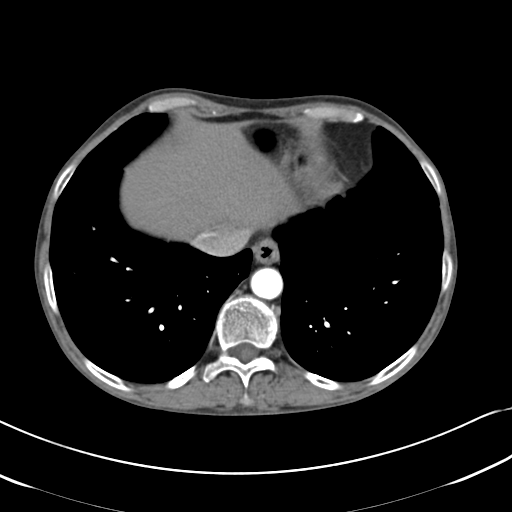
[im 37/104  soft-tissue]
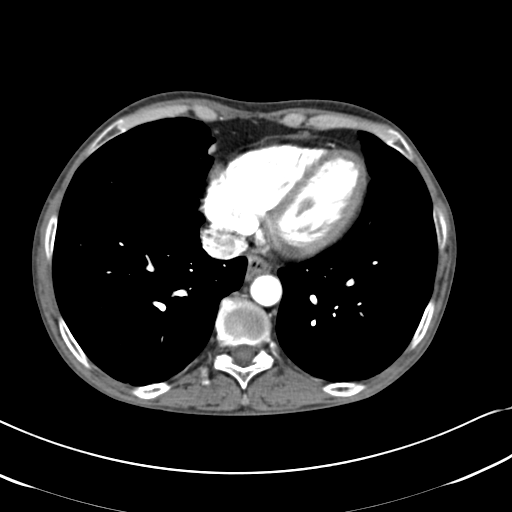
[im 44/104  soft-tissue]
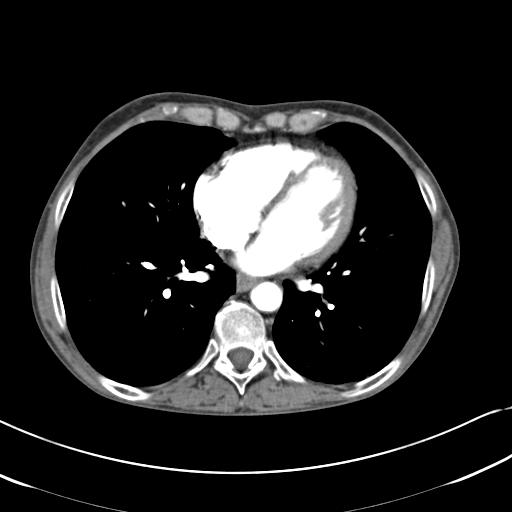
[im 54/104  soft-tissue]
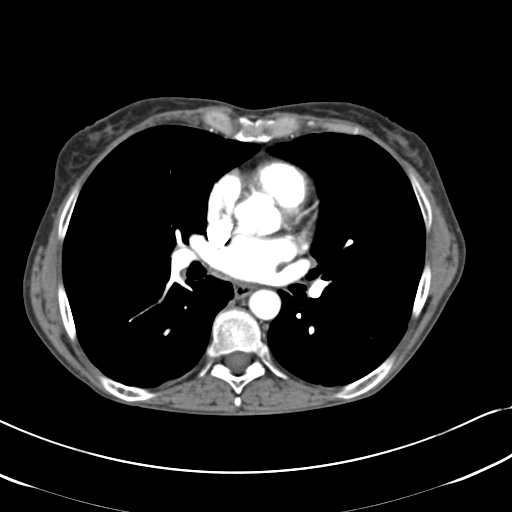
[im 60/104  soft-tissue]
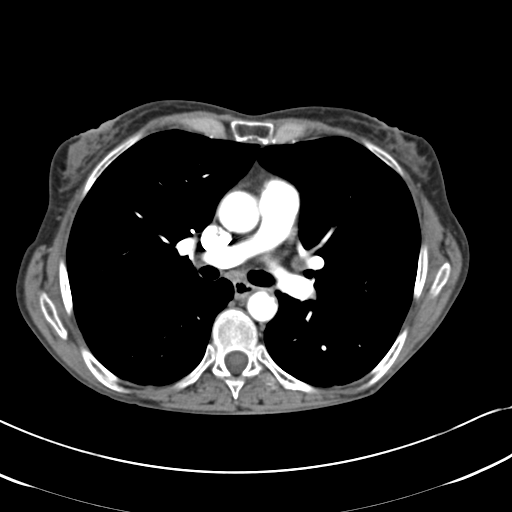
[im 67/104  soft-tissue]
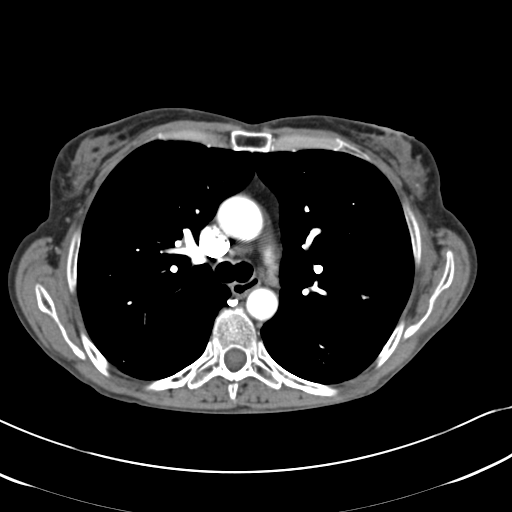
[im 67/104  bone]
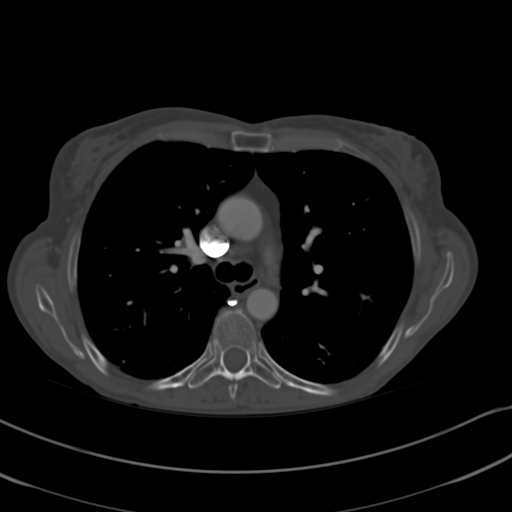
[im 74/104  soft-tissue]
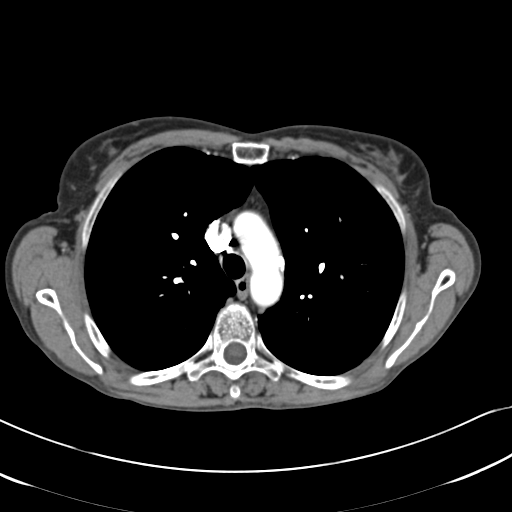
[im 84/104  soft-tissue]
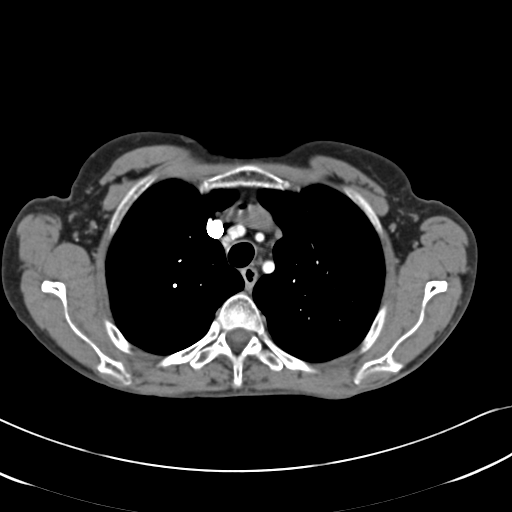
[im 90/104  soft-tissue]
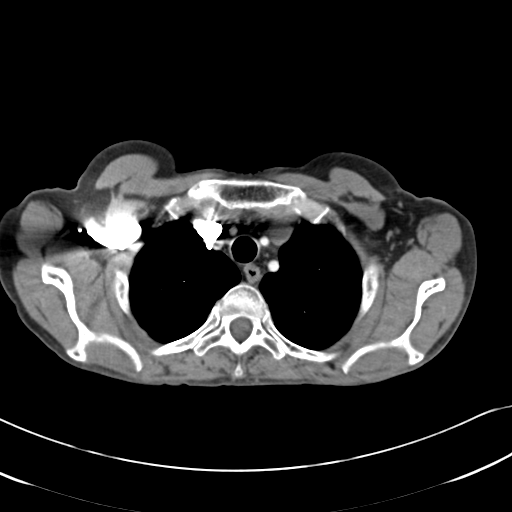
[im 90/104  lung]
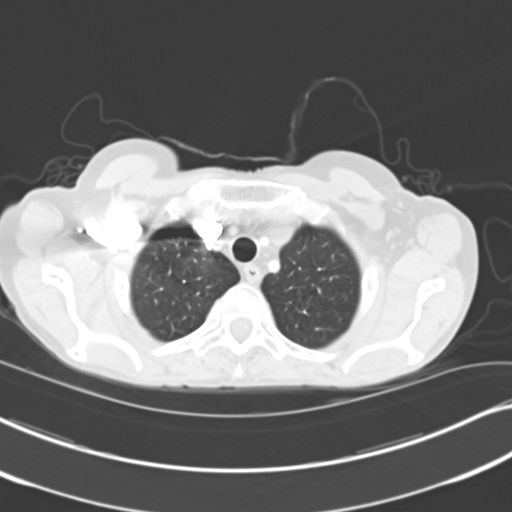
[im 94/104  lung]
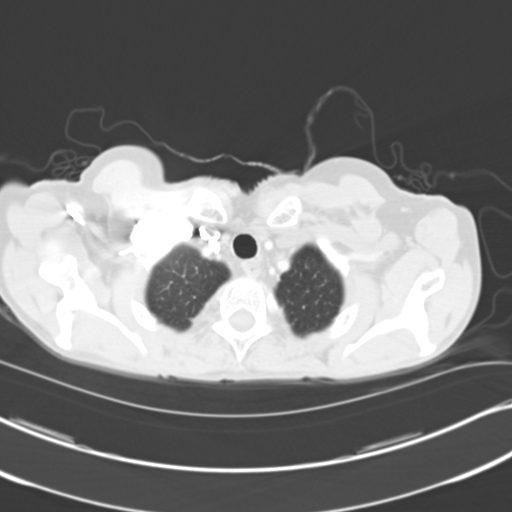
[im 97/104  soft-tissue]
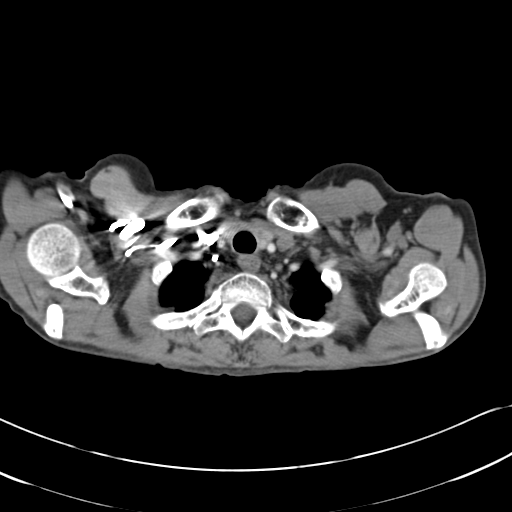
[im 97/104  lung]
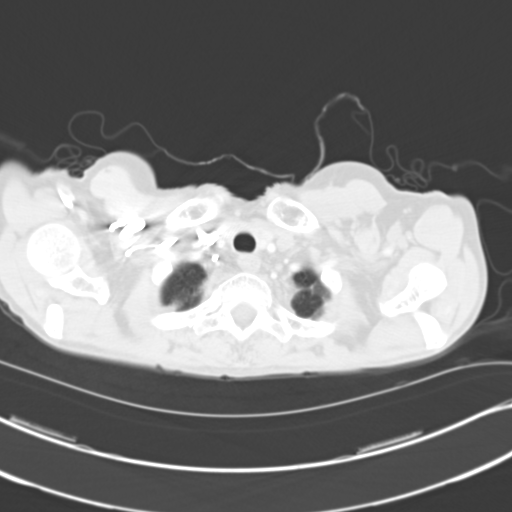
[im 100/104  lung]
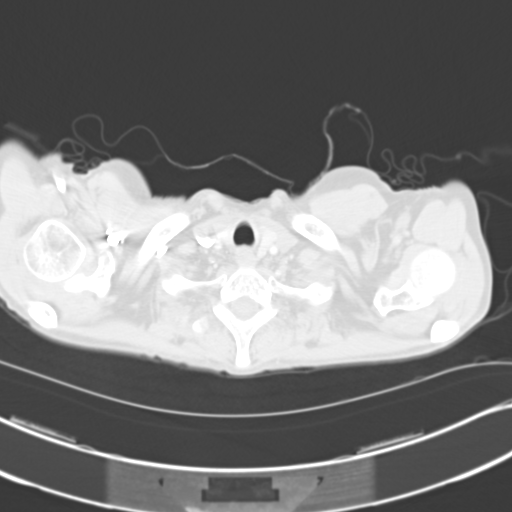

[15 of 32 positions shown; findings below may reference images not displayed]

PROCEDURE:     CT  - CT CHEST (FOR PE) W  - April 16, 2007  [DATE]

RESULT:     The patient received 100 mL of Tsovue-I46. The study is tailored
to evaluate the pulmonary arterial tree. Contrast within the pulmonary
arterial tree is normal in appearance. I do not see evidence of an acute
pulmonary embolism. I see no pathologic sized mediastinal or hilar lymph
nodes. The cardiac chambers are normal in size. The caliber of the thoracic
aorta is normal. There is no pleural or pericardial effusion. At lung window
settings there is a small amount of apical pleural scarring. I see no
interstitial or alveolar infiltrates or evidence of abnormal pulmonary
nodules. There is an approximately 2-mm faintly calcified nodule in a
subpleural location seen on image 23 in the superior segment of the LEFT
lower lobe.

Within the upper abdomen the adrenal glands are normal in appearance. The
observed portions of the liver exhibit minimal intrahepatic ductal dilation.
The pancreatic duct also appears mildly dilated.
IMPRESSION: 1. I do not see evidence of an acute pulmonary embolism.
2. There is no evidence of CHF nor pneumonia nor pathologic sized
mediastinal or hilar lymph nodes.
3. The caliber of the thoracic aorta is normal.
4. I seen no acute pulmonary parenchymal abnormality.

This report was called to Dr. Keyli Tiger at [phone] at the conclusion
of the study.

## 2008-11-02 IMAGING — US US SOFT TISSUE EXCLUDE HEAD/NECK
1 series · 8 of 8 positions shown · non-contrast
Comparison: none

REASON FOR EXAM: cyst under skin right buttock   New Doctor Dr Minal
Melgoza  PH [phone] ...
COMMENTS:

[Series 1: us soft tissue exclude head/neck · 8 of 8 slices shown]
[im 1/8]
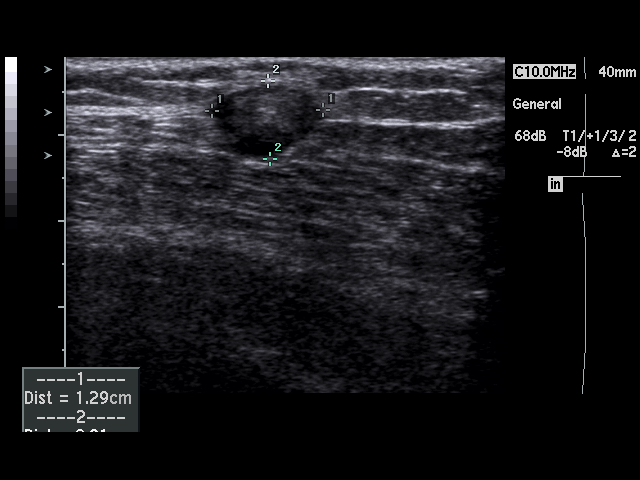
[im 2/8]
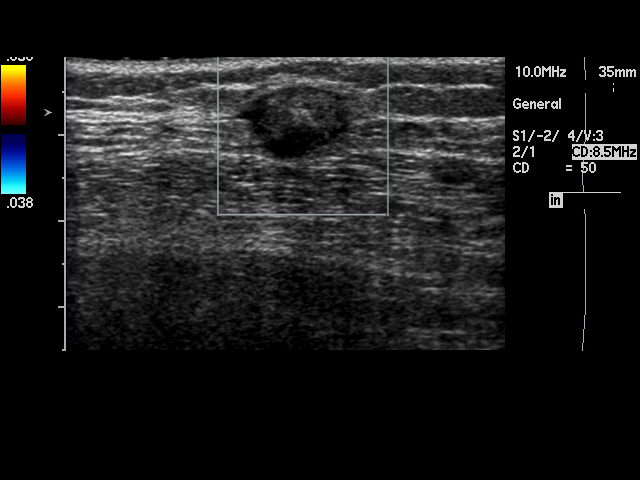
[im 3/8]
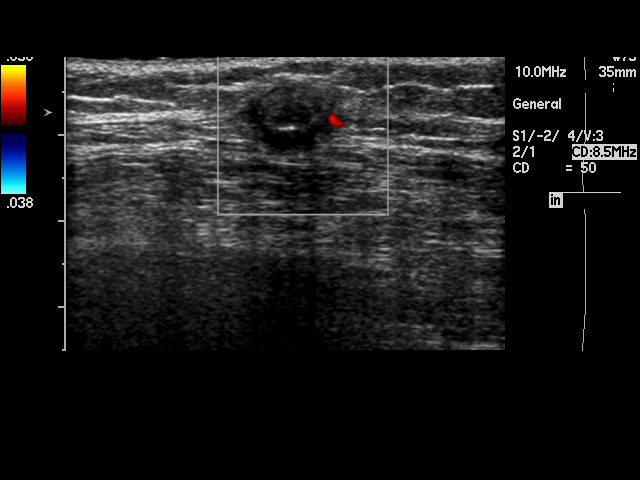
[im 4/8]
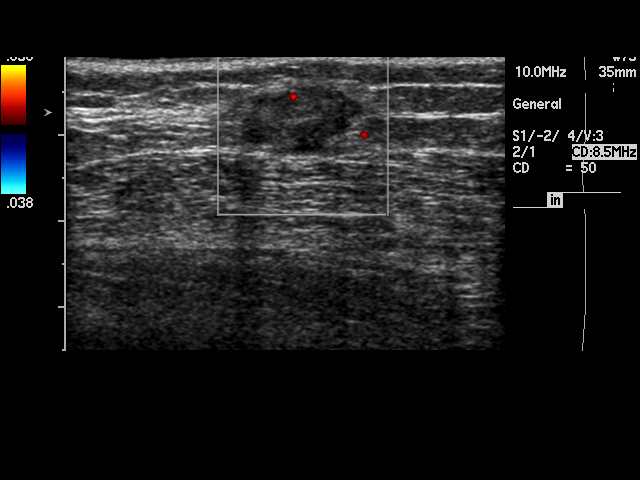
[im 5/8]
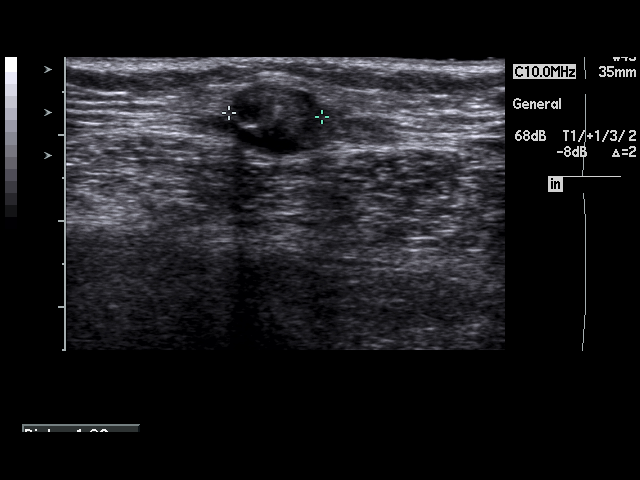
[im 6/8]
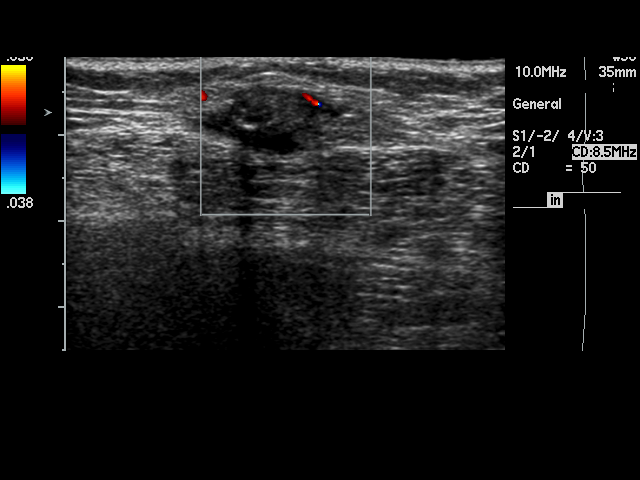
[im 7/8]
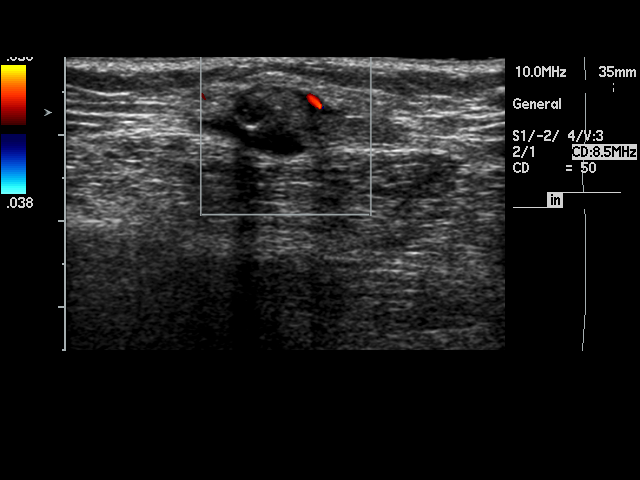
[im 8/8]
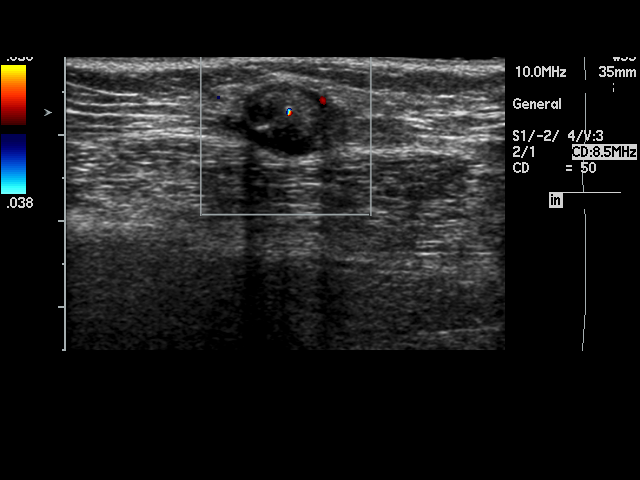

[8 of 8 positions shown; findings below may reference images not displayed]

PROCEDURE:     US  - US SOFT TISSUE, NOT NECK /  HEAD  - April 24, 2007  [DATE]

RESULT:     There is a complex oval shaped hypoechoic mass located
superficially in the soft tissues of the upper RIGHT buttocks. There is
observed some calcification present within the mass. The nature of this mass
is uncertain. This could possibly represent an organized hematoma or
residual from prior abscess. Neoplastic tumor cannot be excluded but at this
point is considered less likely. Followup examination to evaluate for
stability or resolution is suggested. Alternatively this site could be
further evaluated by MR if clinically indicated.
IMPRESSION: There is some nonspecific complex 1.29 cm hypoechoic soft tissue mass as
noted above.

## 2009-01-11 LAB — HM COLONOSCOPY

## 2009-01-29 IMAGING — US US EXTREM LOW VENOUS*R*
1 series · 17 of 24 positions shown · non-contrast
Comparison: none

REASON FOR EXAM: F/U DVT   pt on coumadin
COMMENTS:

PROCEDURE:     CX - CX DOPPLER LOW EXTR RIGHT  - July 21, 2007 [DATE]
RESULT:     There is evidence of nonocclusive thrombus in the external iliac
vein on the RIGHT as well as in the common femoral vein. The superficial
femoral vein and the popliteal vein do not reveal evidence of thrombus.

[Series 1: us extrem low venous*right* · 17 of 26 slices shown]
[im 1/26]
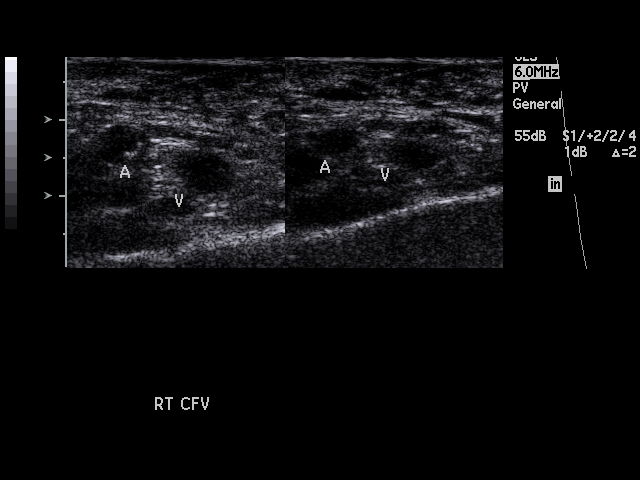
[im 3/26]
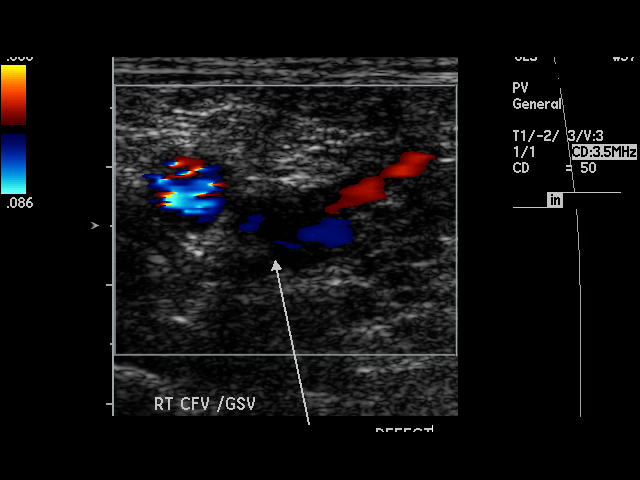
[im 4/26]
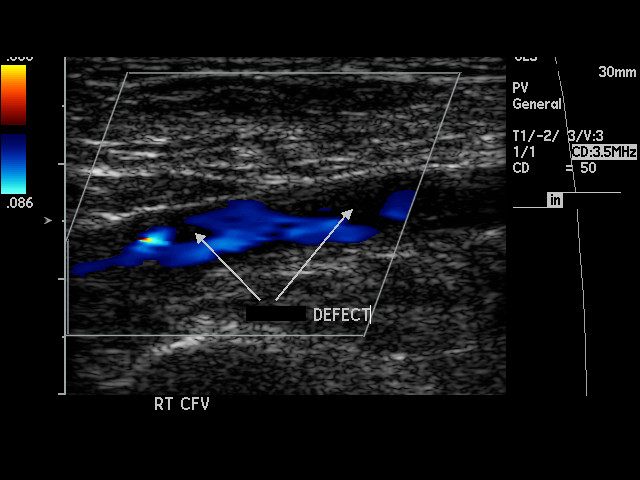
[im 5/26]
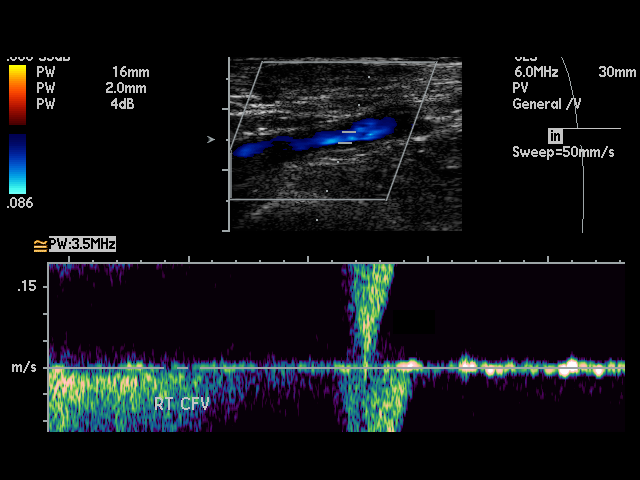
[im 7/26]
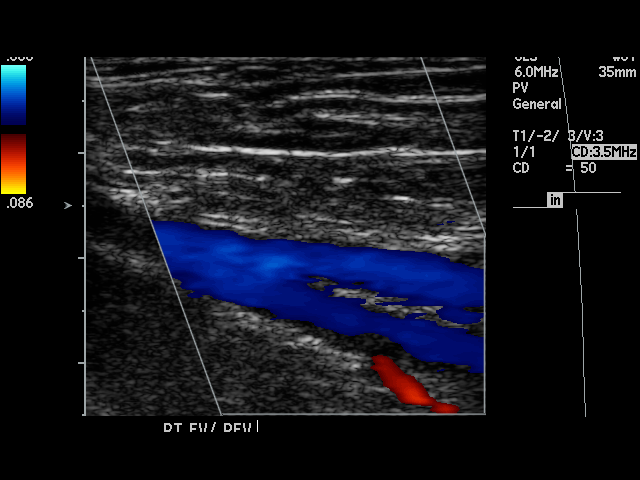
[im 8/26]
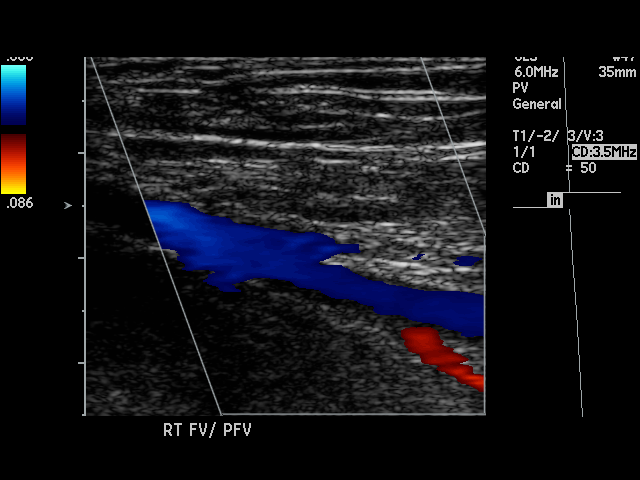
[im 10/26]
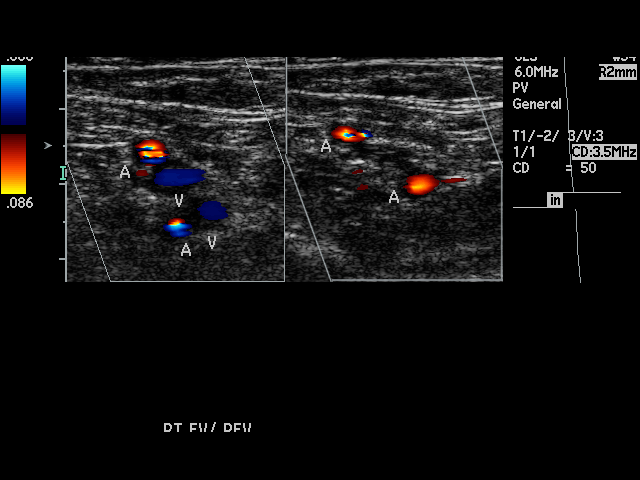
[im 11/26]
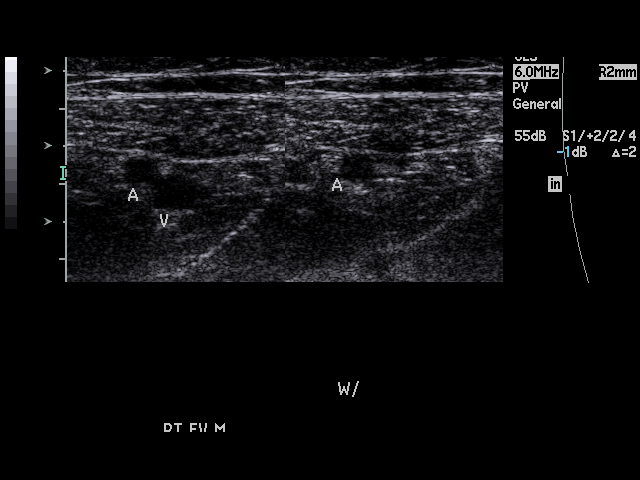
[im 14/26]
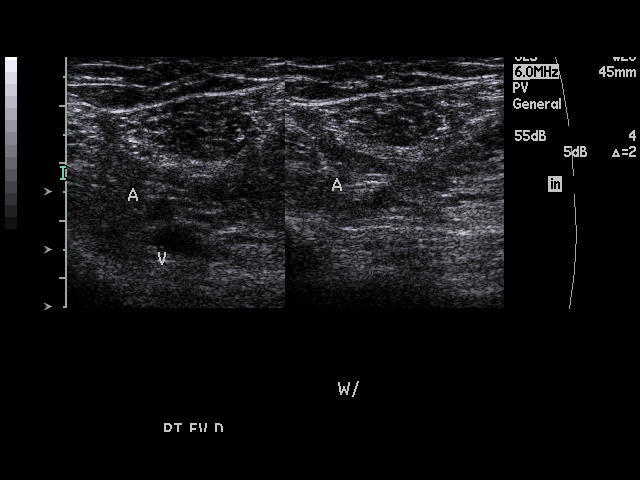
[im 15/26]
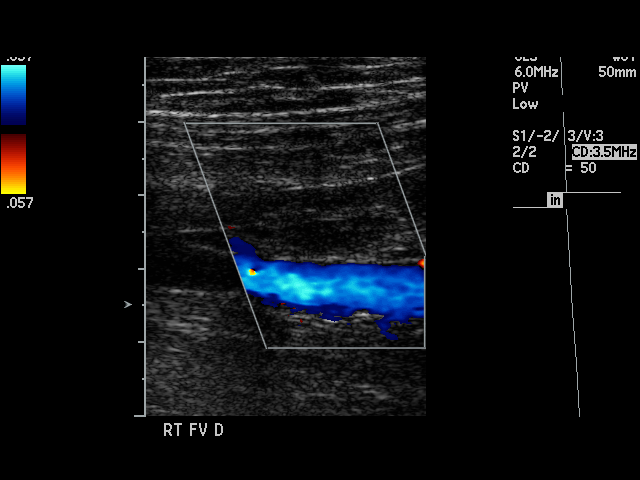
[im 16/26]
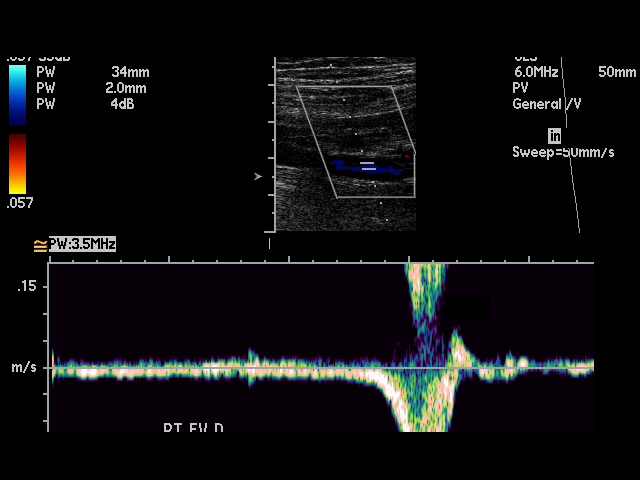
[im 18/26]
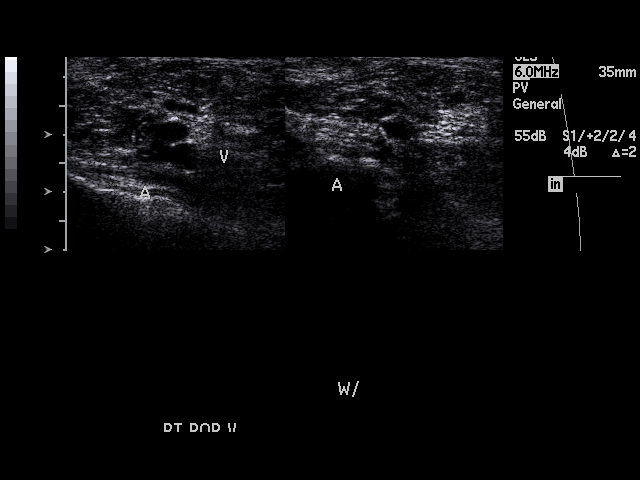
[im 19/26]
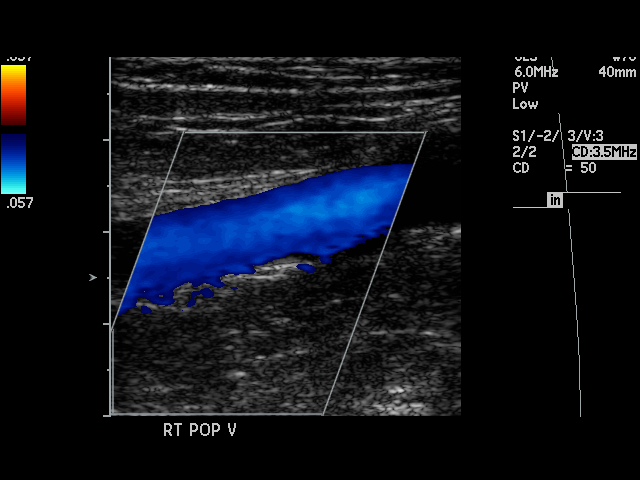
[im 21/26]
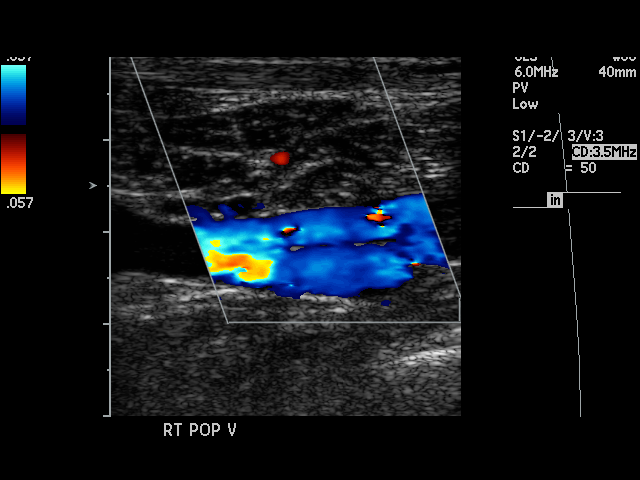
[im 22/26]
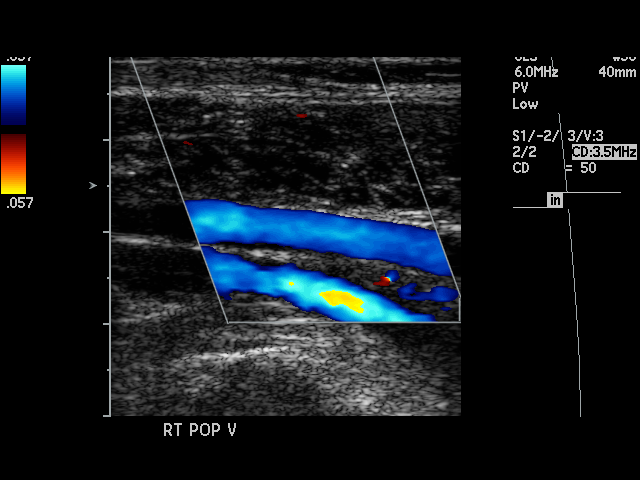
[im 23/26]
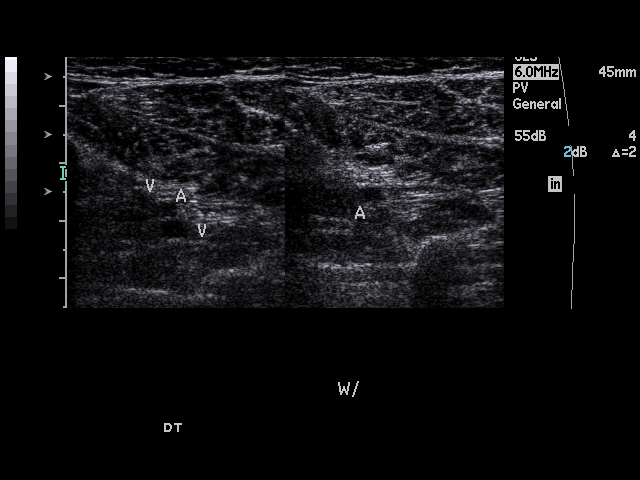
[im 26/26]
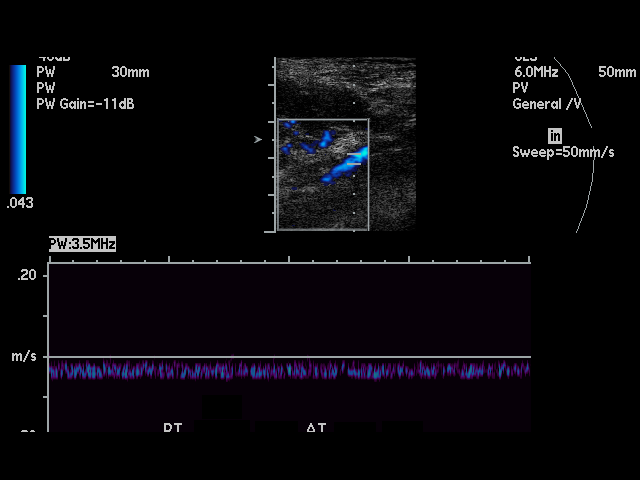

[17 of 24 positions shown; findings below may reference images not displayed]

IMPRESSION: There is nonocclusive thrombus within the RIGHT external iliac vein and
RIGHT common femoral vein. This is a marked improvement over the findings on
29 January, 2007.

This report was called to Dr. [REDACTED] at the conclusion of the study.

## 2009-02-01 ENCOUNTER — Inpatient Hospital Stay: Payer: Self-pay | Admitting: Internal Medicine

## 2009-02-25 IMAGING — CR DG CHEST 2V
1 series · 2 of 2 positions shown · non-contrast
Comparison: none

REASON FOR EXAM: cough
COMMENTS:

PROCEDURE:     MDR - MDR CHEST PA(OR AP) AND LATERAL  - August 17, 2007  [DATE]
RESULT:     The lung fields are clear. The heart, mediastinal and osseous
structures are normal in appearance. The chest is hyperexpanded compatible
with a history of COPD or asthma.

[Series 1: view not recorded · 0.17mm/px · 2 of 2 slices shown]
[im 1/2]
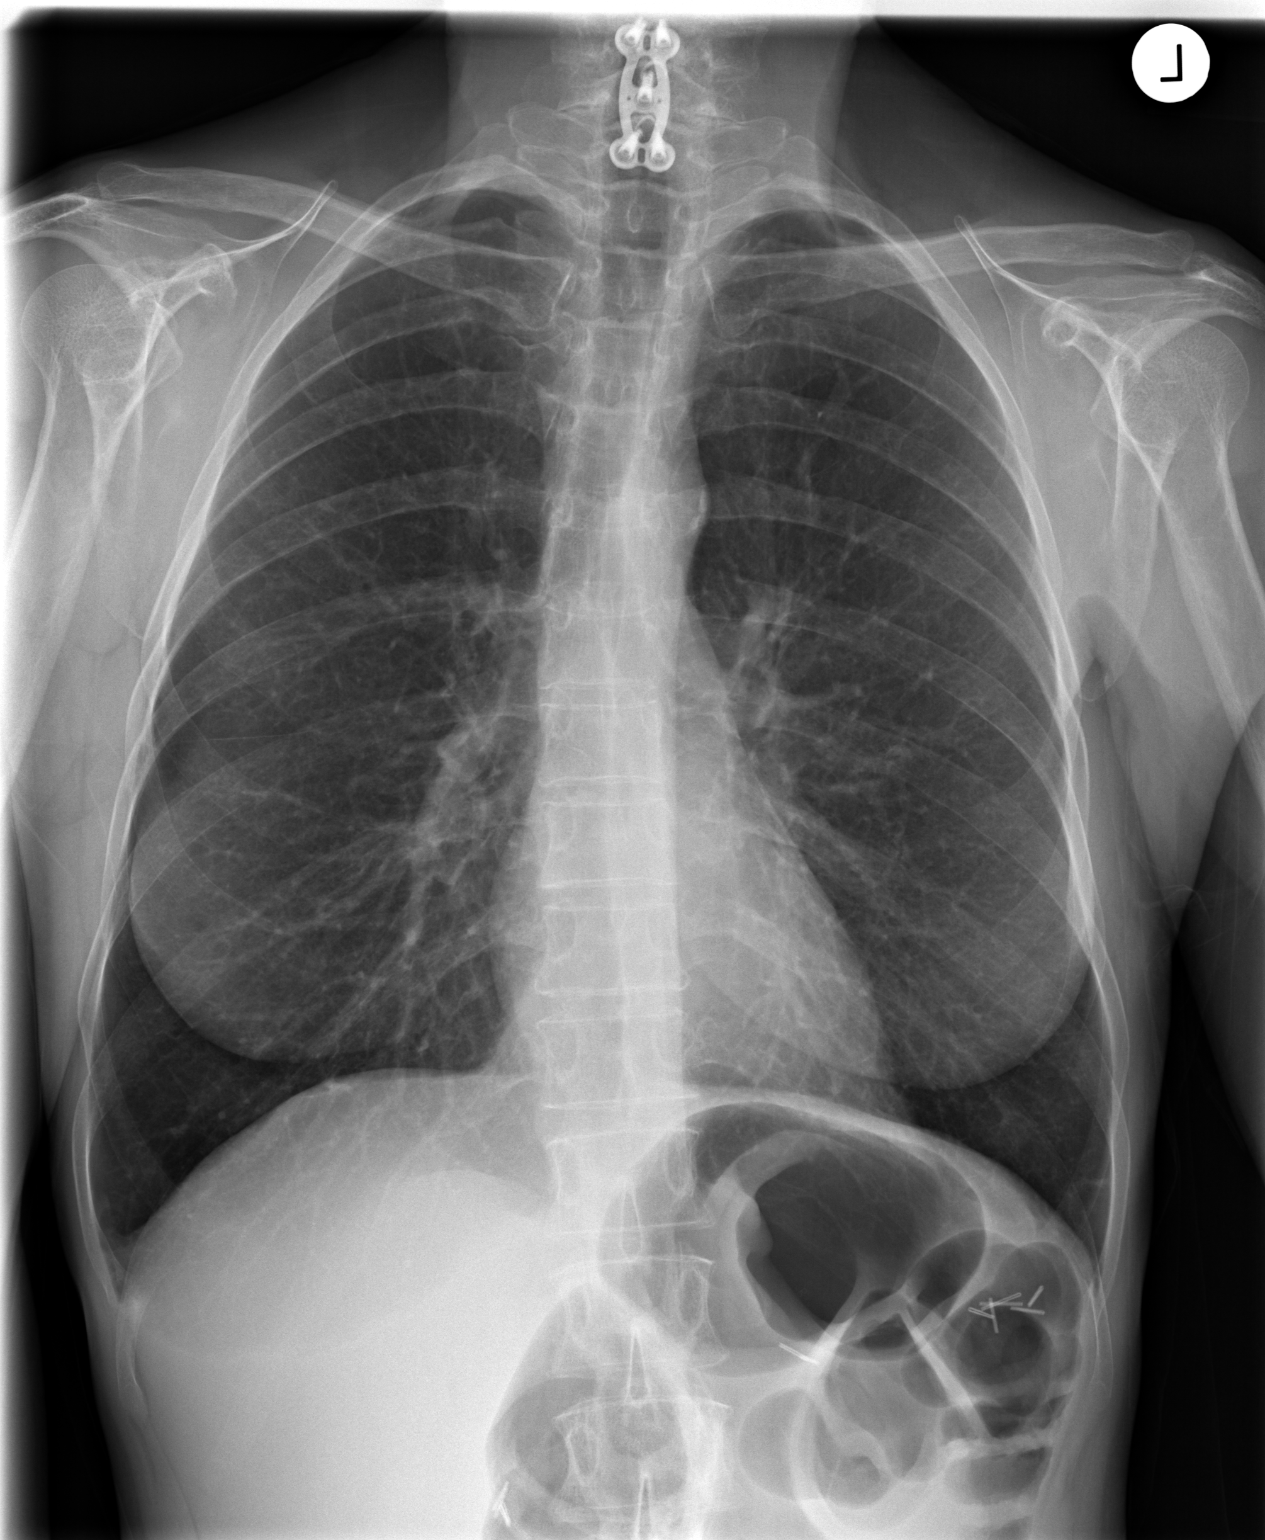
[im 2/2]
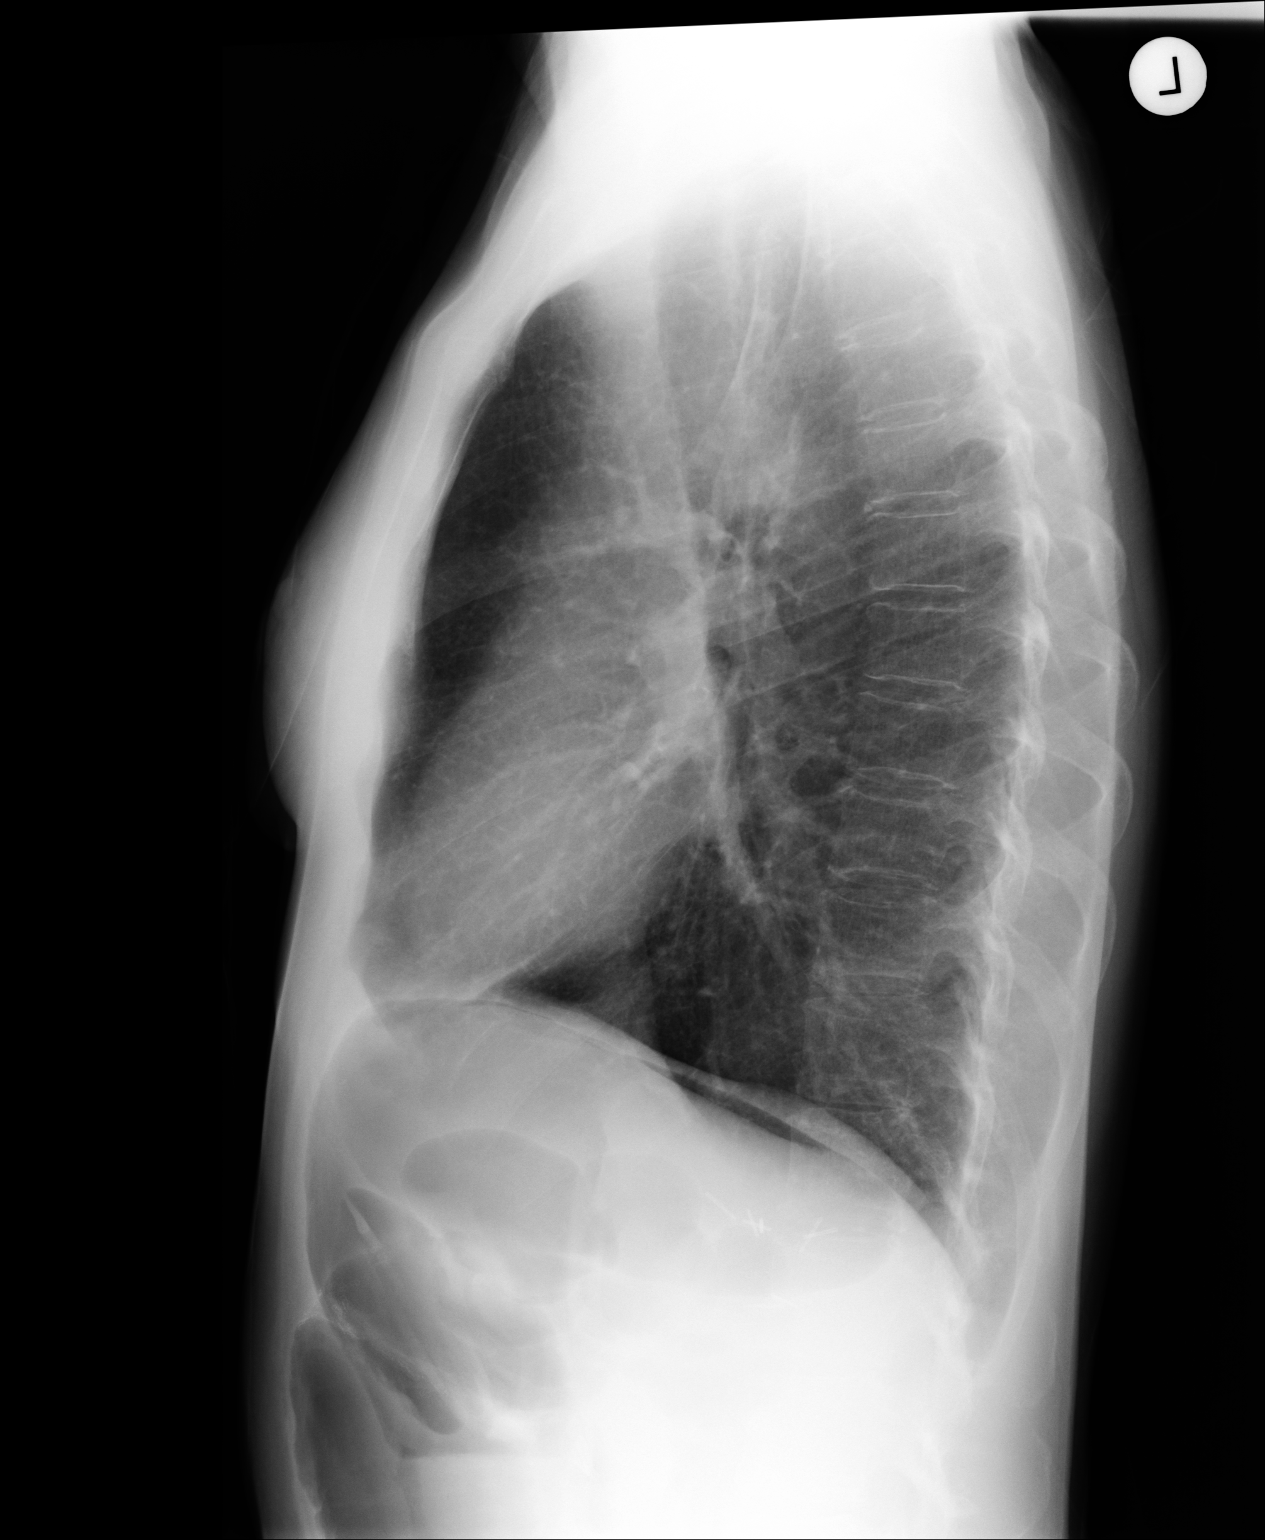

[2 of 2 positions shown; findings below may reference images not displayed]

IMPRESSION: 1. The lung fields are clear.
2. The chest is hyperexpanded compatible with a history of COPD or asthma.

## 2009-03-06 ENCOUNTER — Ambulatory Visit: Payer: Self-pay | Admitting: Surgery

## 2009-05-03 ENCOUNTER — Ambulatory Visit: Payer: Self-pay | Admitting: Family

## 2009-06-05 IMAGING — CR RIGHT ANKLE - COMPLETE 3+ VIEW
1 series · 5 of 5 positions shown · non-contrast
Comparison: none

REASON FOR EXAM: Ankle pain from recent fall-CALL RESULTS DR.LAMSLATEE
[phone]
COMMENTS:

PROCEDURE:     DXR - DXR ANKLE RIGHT COMPLETE  - November 25, 2007 [DATE]
RESULT:     Views of the ankle reveal the joint mortise to be preserved. The
talar dome is intact. The hindfoot appears intact as well. The bones are
osteopenic.

[Series 1: view not recorded · 0.17mm/px · 5 of 5 slices shown]
[im 1/5]
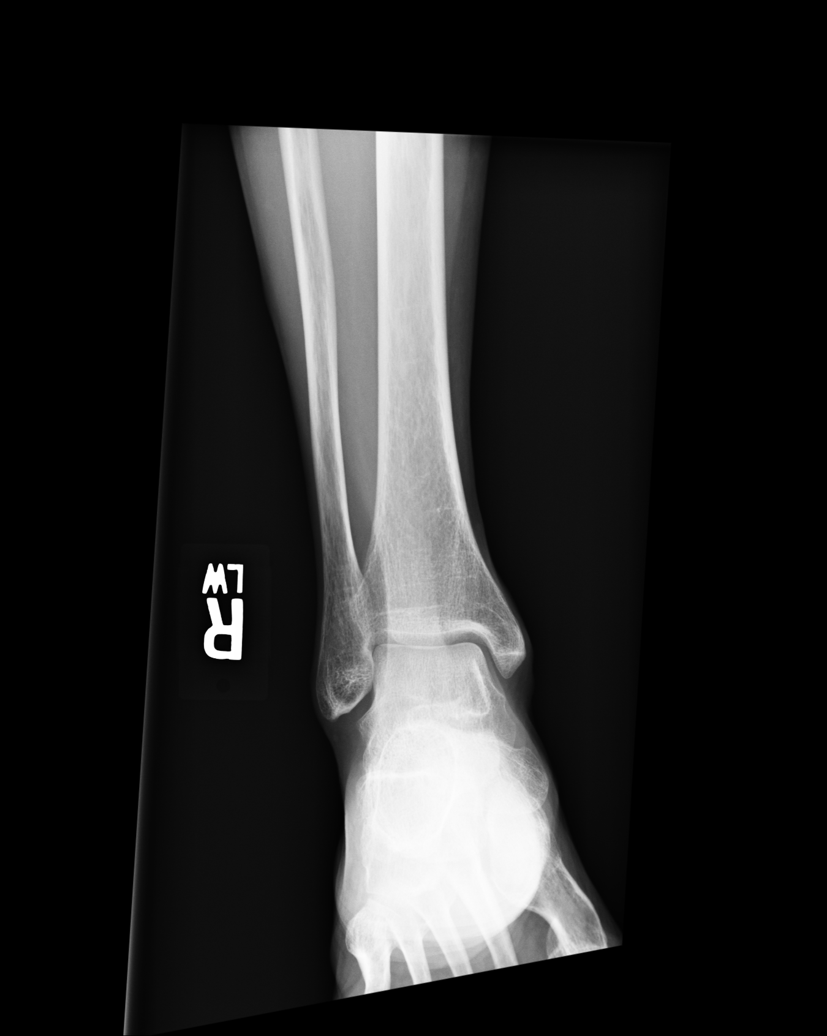
[im 2/5]
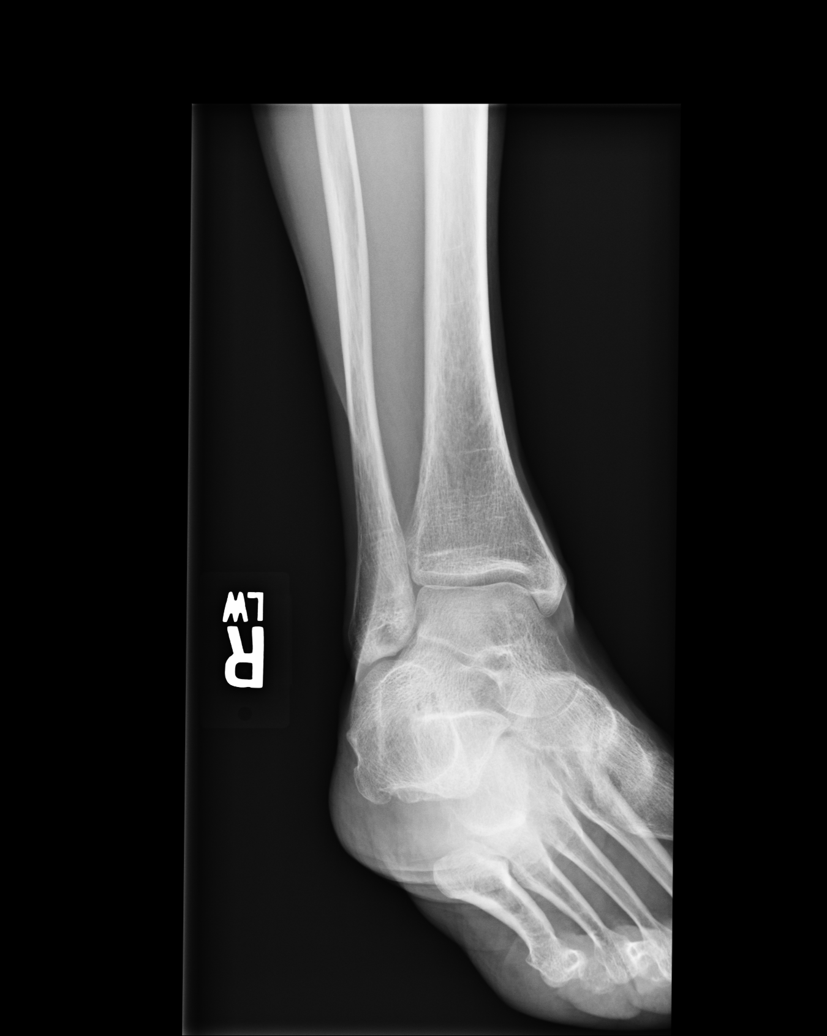
[im 3/5]
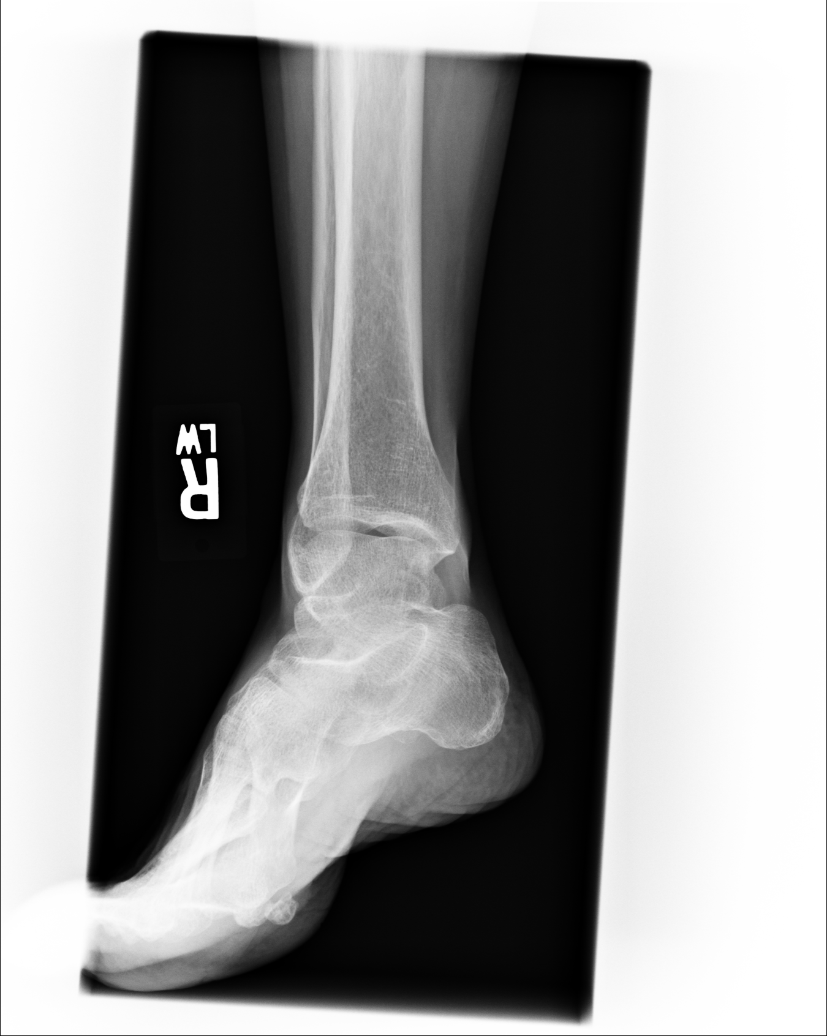
[im 4/5]
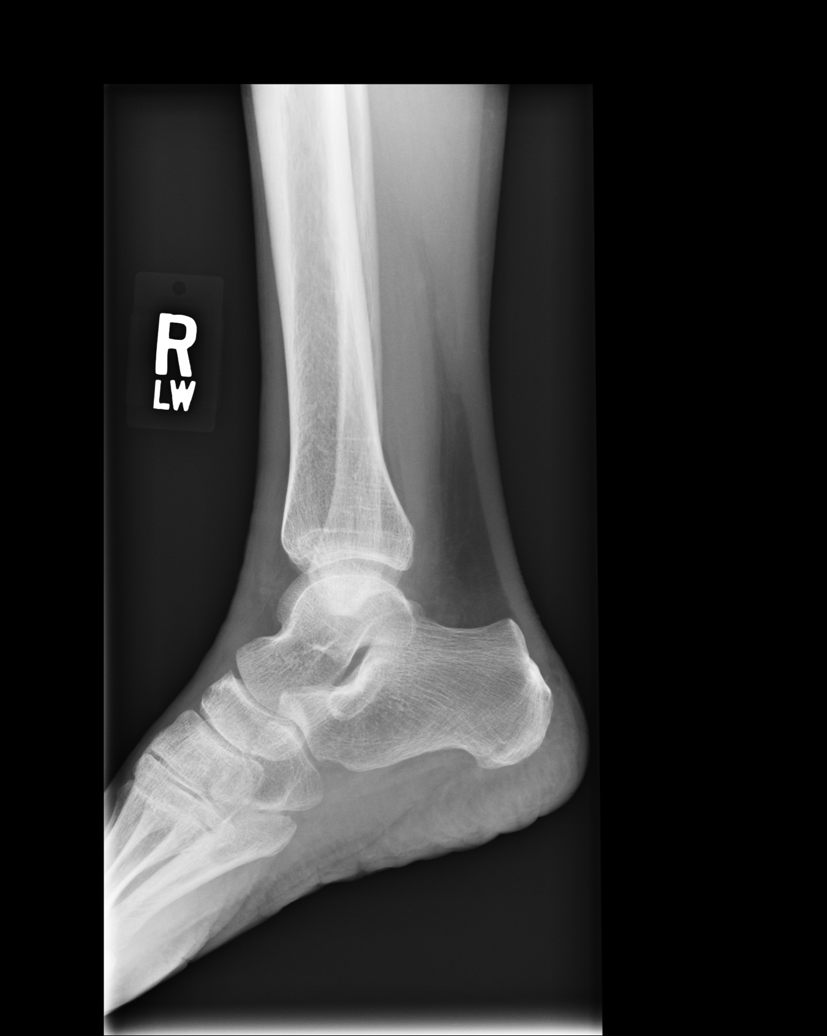
[im 5/5]
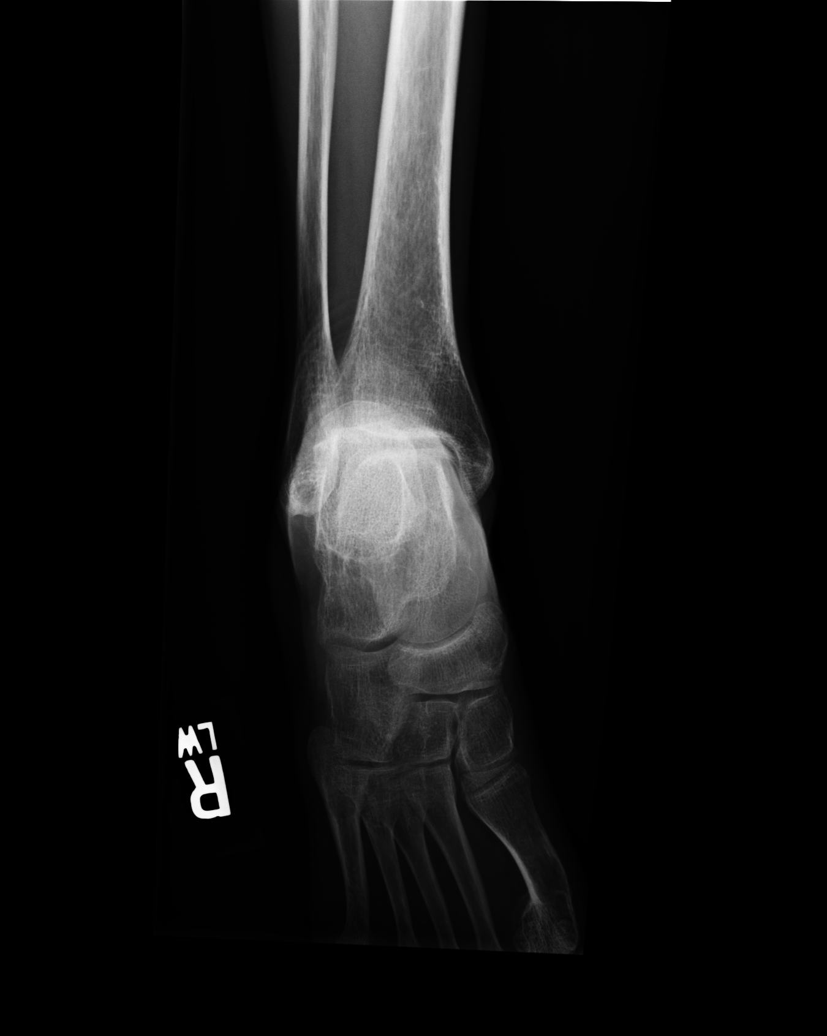

[5 of 5 positions shown; findings below may reference images not displayed]

IMPRESSION: I do not see acute abnormality of the ankle. Followup
imaging is available if the patient's symptoms warrant this.

## 2009-08-21 IMAGING — CT CT ABD-PELV W/ CM
1 of 2 series · 15 of 32 positions shown, 19 images · non-contrast
Comparison: none

REASON FOR EXAM: abd pain  epigastric
COMMENTS:

[Series 2: abdomen · axial · 0.63mm/px · z∈[-325,+51]mm · 15 of 53 slices shown, 19 images]
[im 3/53  soft-tissue]
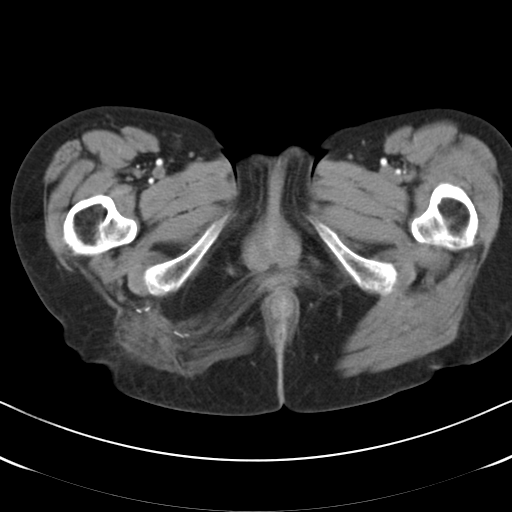
[im 3/53  bone]
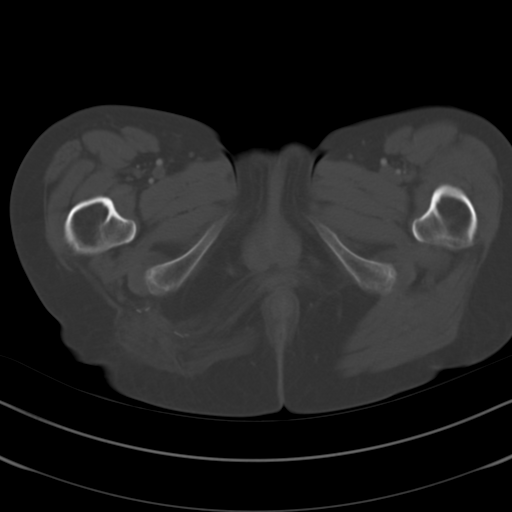
[im 7/53  soft-tissue]
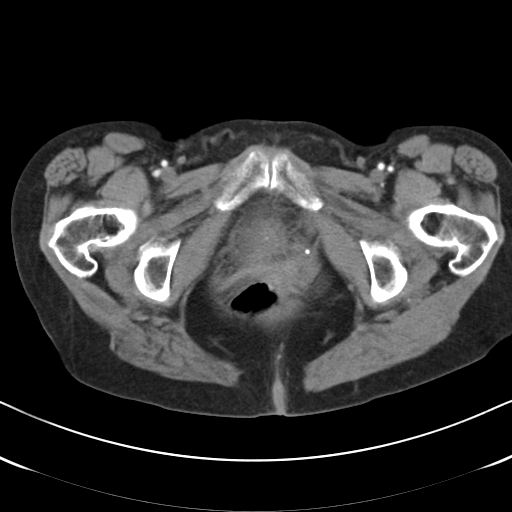
[im 11/53  soft-tissue]
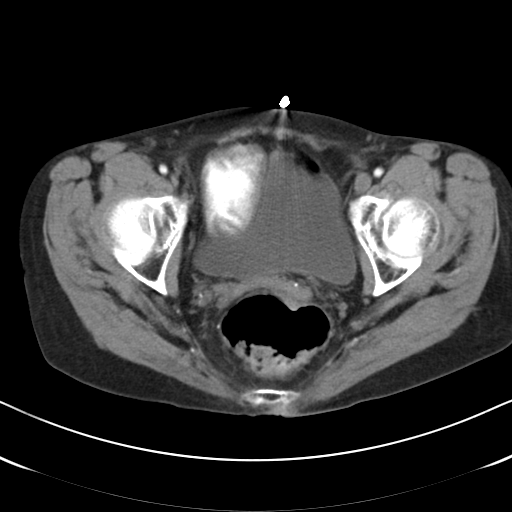
[im 16/53  soft-tissue]
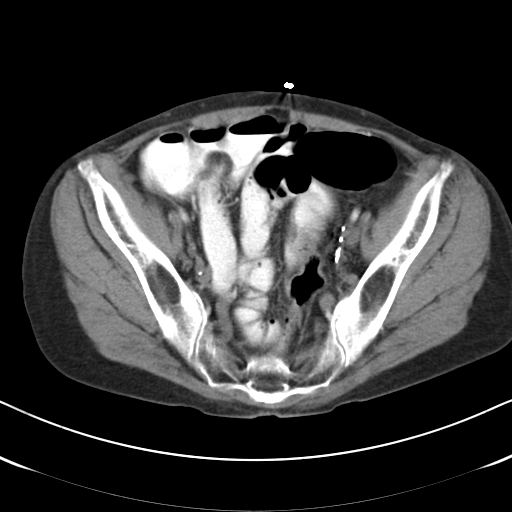
[im 18/53  soft-tissue]
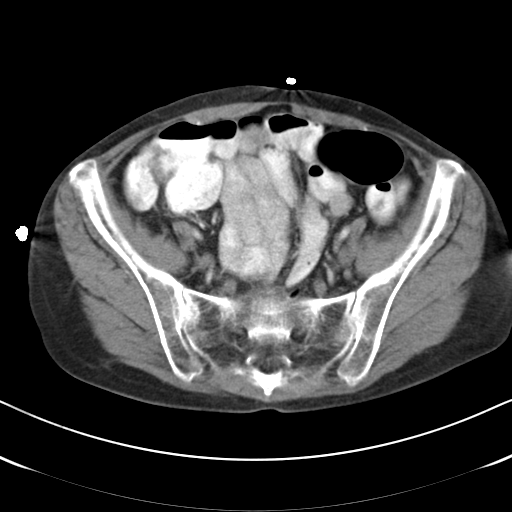
[im 22/53  soft-tissue]
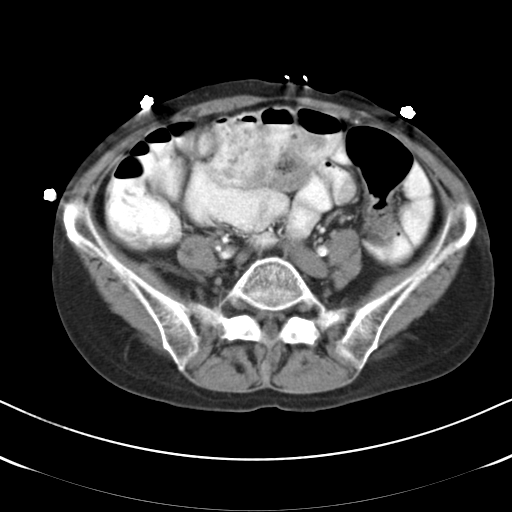
[im 27/53  soft-tissue]
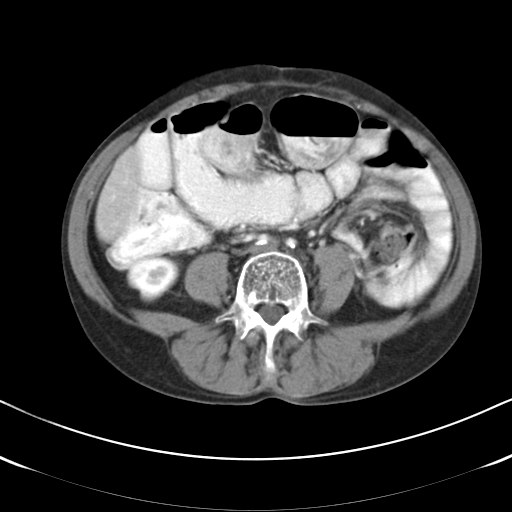
[im 31/53  soft-tissue]
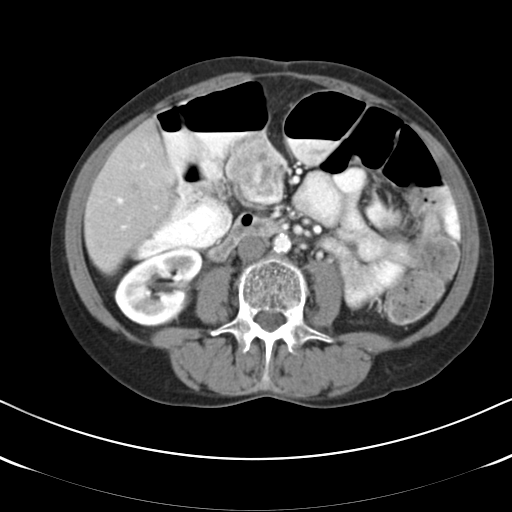
[im 35/53  soft-tissue]
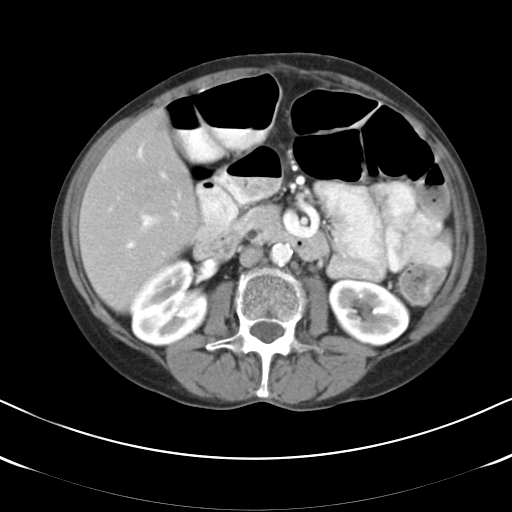
[im 35/53  bone]
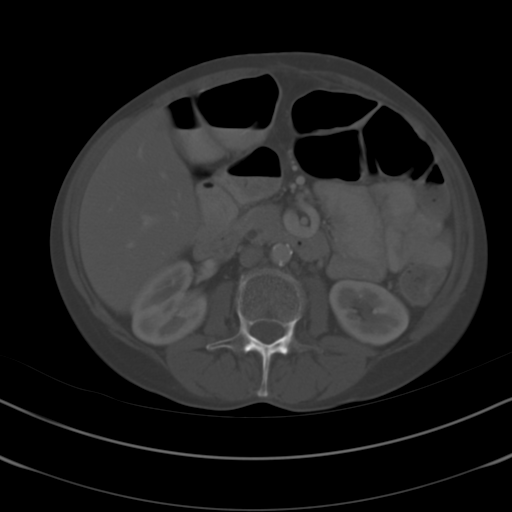
[im 37/53  soft-tissue]
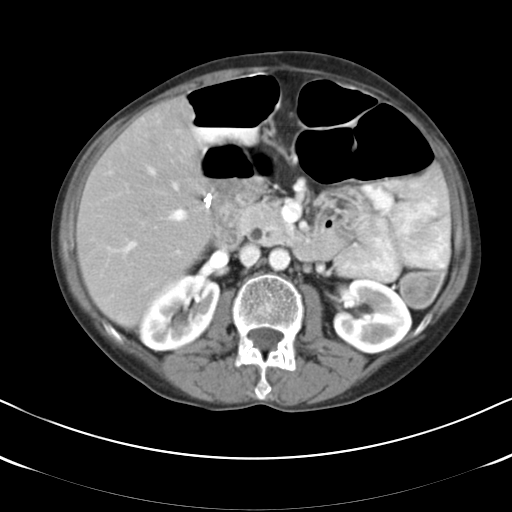
[im 42/53  soft-tissue]
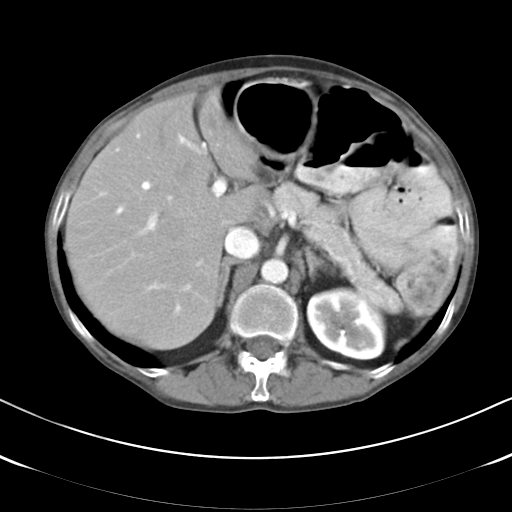
[im 44/53  lung]
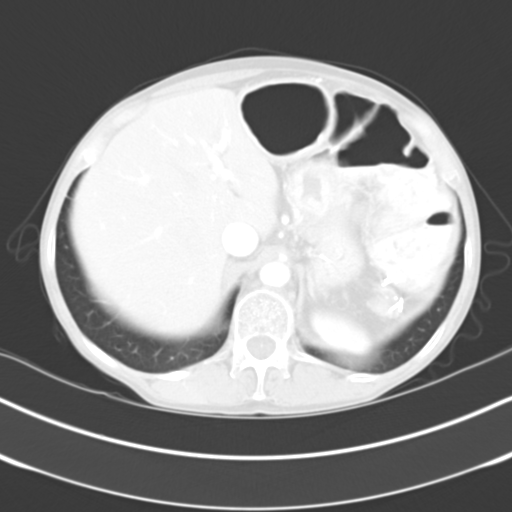
[im 46/53  soft-tissue]
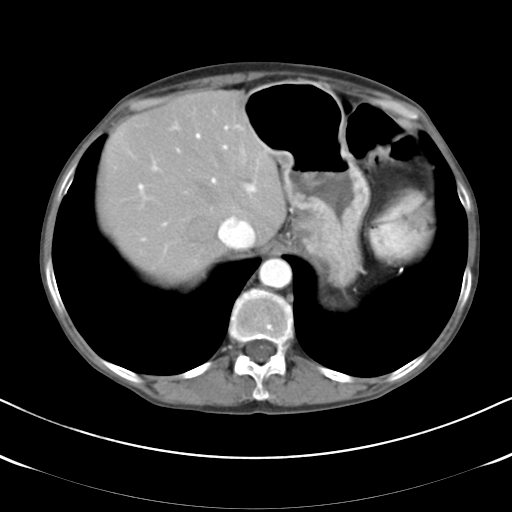
[im 46/53  lung]
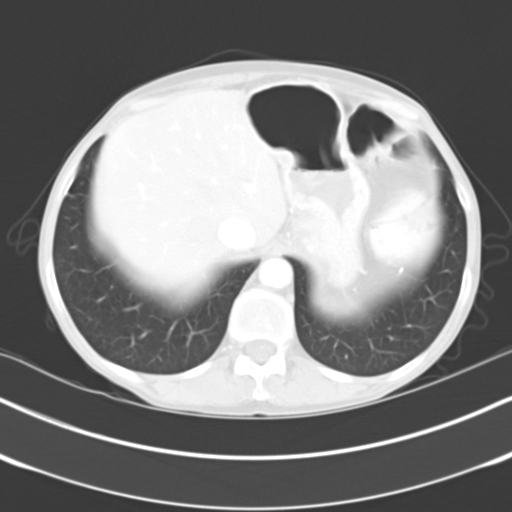
[im 48/53  lung]
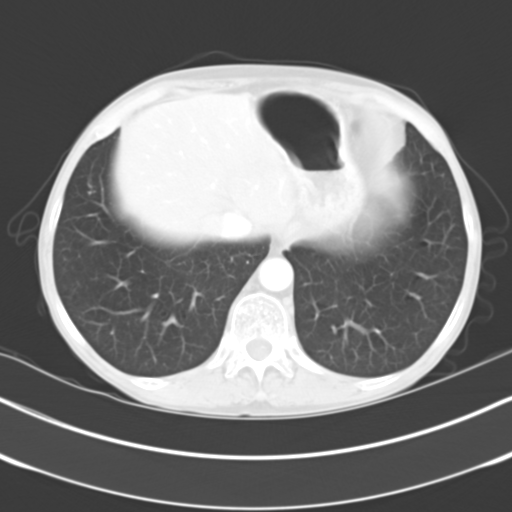
[im 50/53  soft-tissue]
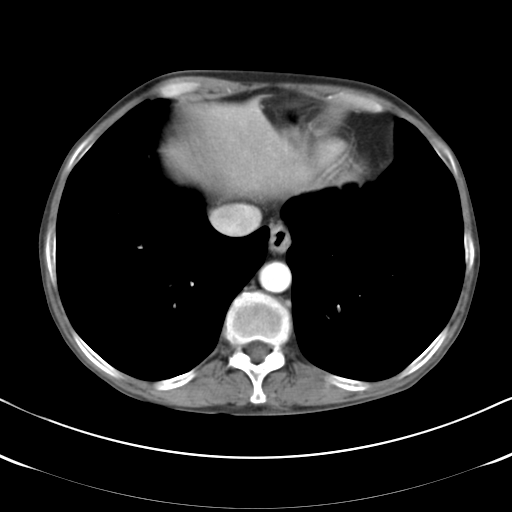
[im 50/53  lung]
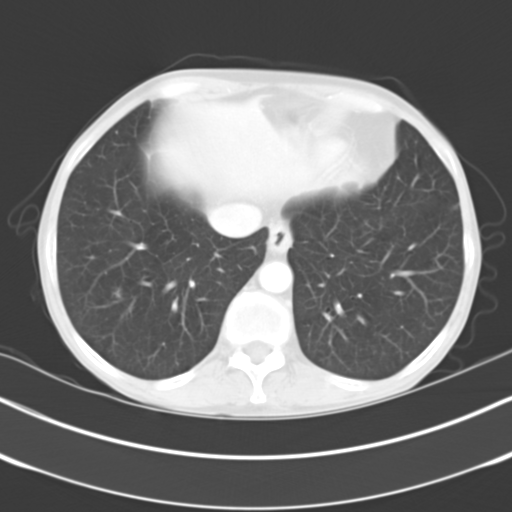

[15 of 32 positions shown; findings below may reference images not displayed]

PROCEDURE:     CT  - CT ABDOMEN / PELVIS  W  - February 10, 2008  [DATE]

RESULT:     The patient is experiencing epigastric discomfort. There is a
history of Crohn's disease. Comparison is made to a study 23 December, 2006. The patient received 85 ml of 5sovue-IIJ as well as oral contrast
material.

There is mild distention of large bowel loops with contrast and air. There
is stool and gas in the region of the rectum. I do not see evidence of
abnormal bowel wall thickening. The pattern does not suggest obstruction.

The liver exhibits normal density with no focal mass nor evidence of ductal
dilation. The gallbladder is surgically absent. The pancreas is normal in
density and contour. The common bile duct is mildly prominent in the
pancreatic head but has become less conspicuous than on the prior study.
Currently, the maximal diameter of the common bile duct is approximately
mm. No findings are seen to suggest a pancreatic mass or acute inflammation.
The stomach is partially distended and grossly normal. The spleen is
surgically absent. The kidneys exhibit no evidence of obstruction or other
acute abnormality. The adrenal glands are normal in density and contour. The
caliber of the abdominal aorta is normal. I see no periaortic nor pericaval
lymphadenopathy. Within the pelvis the partially distended urinary bladder
is normal in appearance. The uterus is apparently surgically absent. There
are no adnexal masses. The lumbar vertebral bodies are preserved in height.

The lung bases are clear. The previously demonstrated effusions as well as
areas of atelectasis and/or infiltrate have cleared.
IMPRESSION: 1. I do not see evidence of bowel obstruction. The bowel loops are mildly
distended. These are principally large bowel loops but the pattern does not
appear obstructive.
2. I do not see evidence of acute urinary tract abnormality nor acute
abnormality of the liver or pancreas.
3. The lung bases are now clear with interval resolution of previously
demonstrated infiltrates, atelectasis, and pleural effusions.

## 2009-10-20 ENCOUNTER — Ambulatory Visit: Payer: Self-pay | Admitting: Internal Medicine

## 2010-05-16 ENCOUNTER — Ambulatory Visit: Payer: Self-pay | Admitting: Internal Medicine

## 2010-05-25 ENCOUNTER — Ambulatory Visit: Payer: Self-pay | Admitting: Internal Medicine

## 2010-06-01 ENCOUNTER — Emergency Department: Payer: Self-pay | Admitting: Emergency Medicine

## 2010-08-13 IMAGING — CT CT ABD-PELV W/O CM
1 of 2 series · 14 of 32 positions shown, 18 images · non-contrast
Comparison: none

REASON FOR EXAM: (1) Abdominal Pain-  NO IV CONTRAST, ORAL ONLY; (2)
Abdominal Pain
COMMENTS:

[Series 2: abdomen · axial · 0.57mm/px · z∈[-126,+248]mm · 14 of 83 slices shown, 18 images]
[im 4/83  soft-tissue]
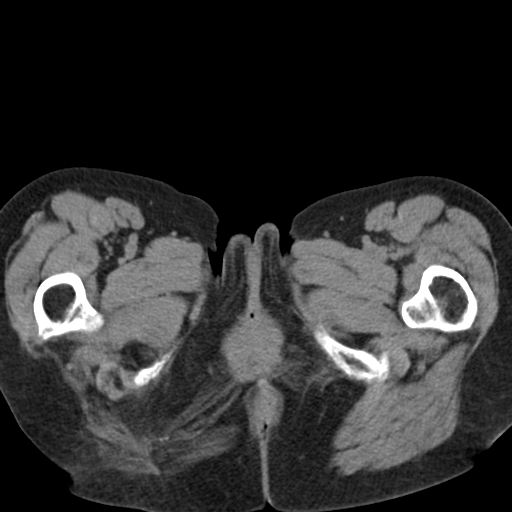
[im 4/83  bone]
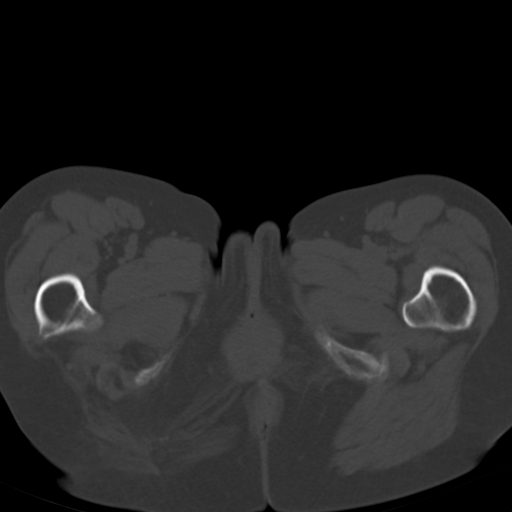
[im 11/83  soft-tissue]
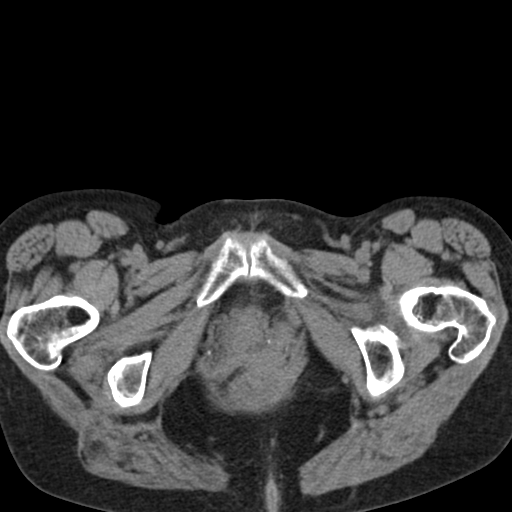
[im 18/83  soft-tissue]
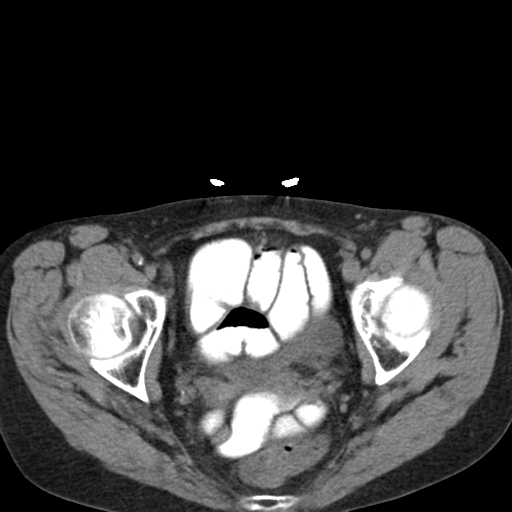
[im 24/83  soft-tissue]
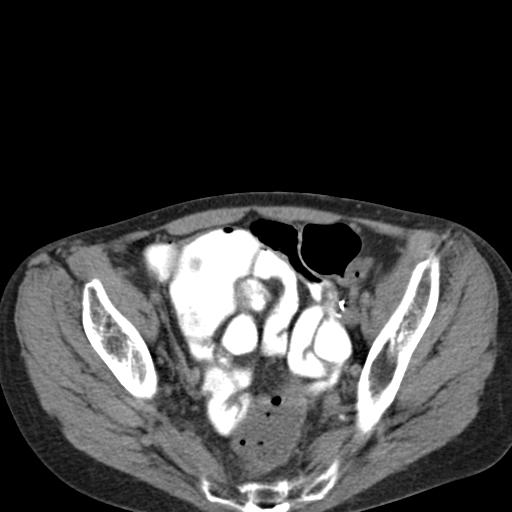
[im 31/83  soft-tissue]
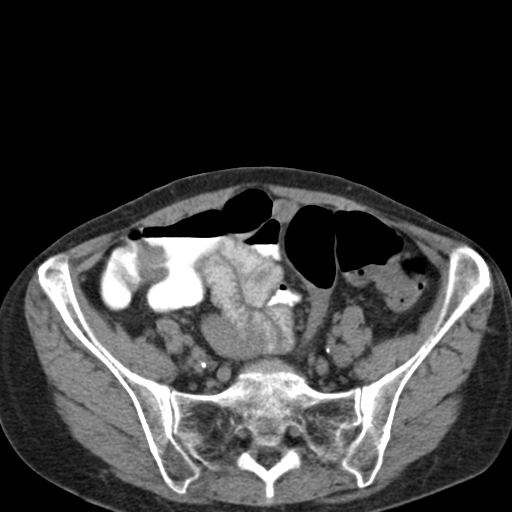
[im 38/83  soft-tissue]
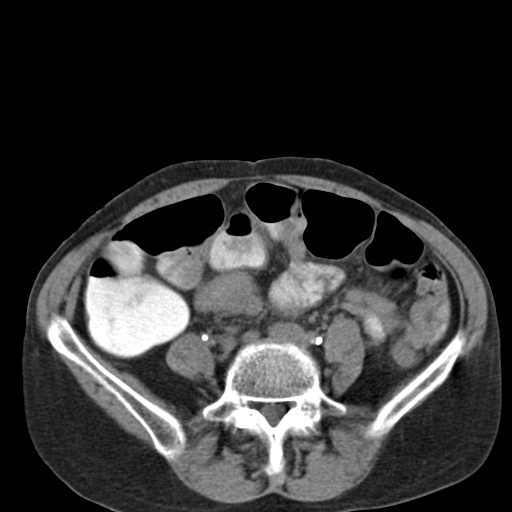
[im 45/83  soft-tissue]
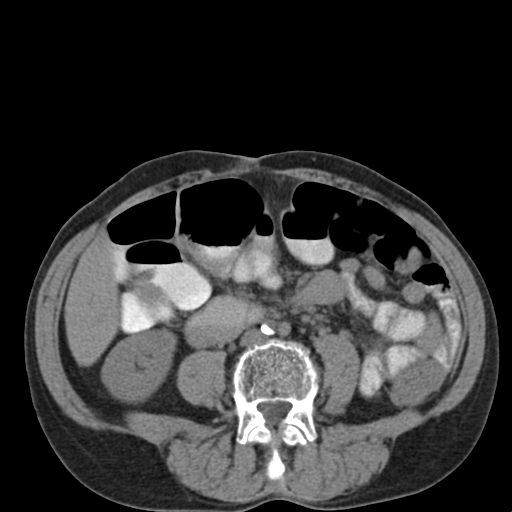
[im 52/83  soft-tissue]
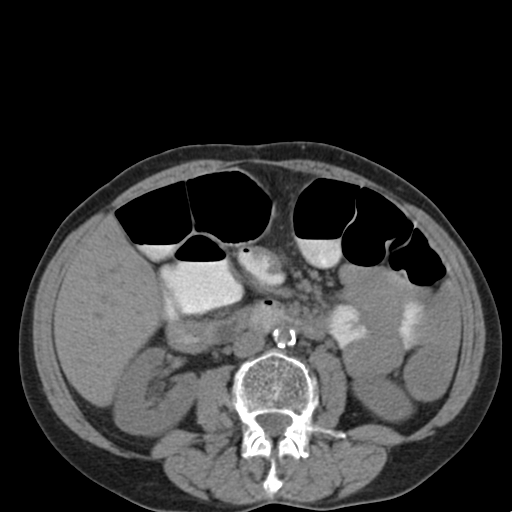
[im 59/83  soft-tissue]
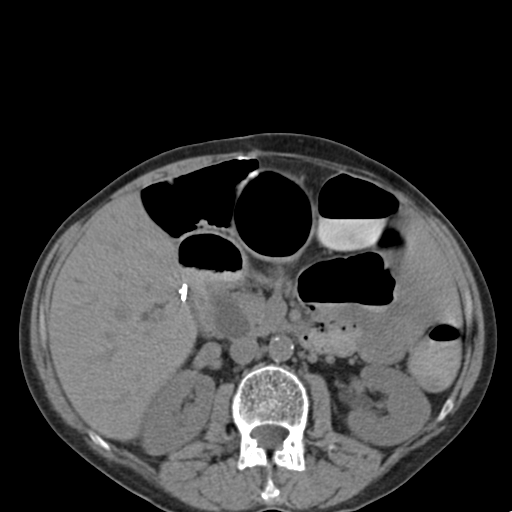
[im 59/83  bone]
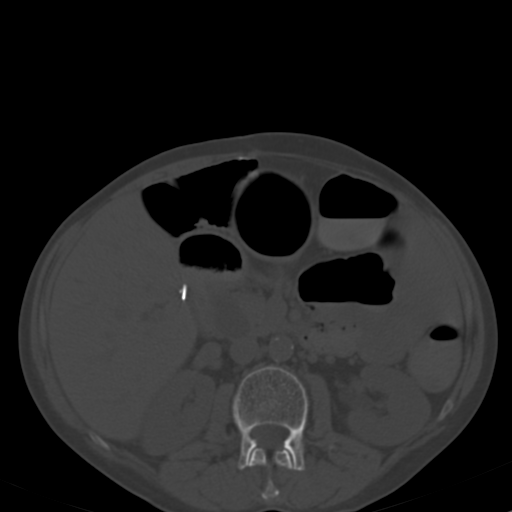
[im 65/83  soft-tissue]
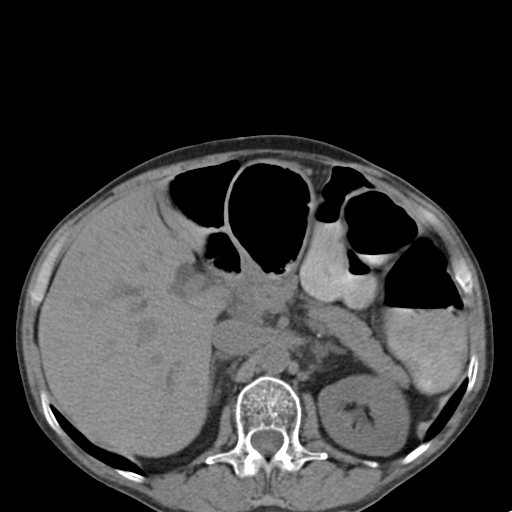
[im 69/83  lung]
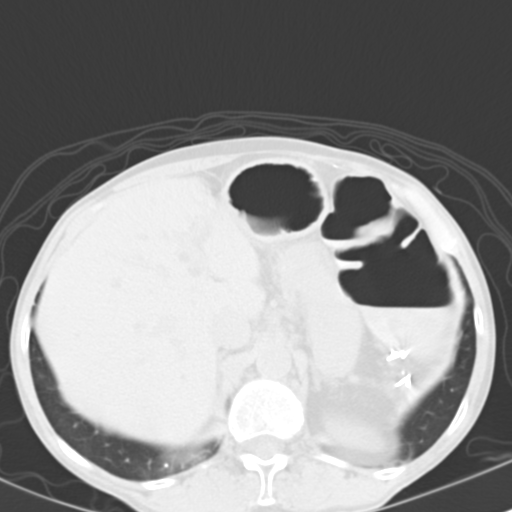
[im 72/83  soft-tissue]
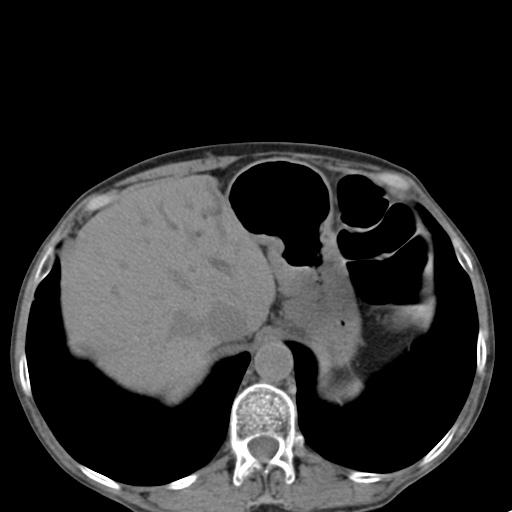
[im 72/83  lung]
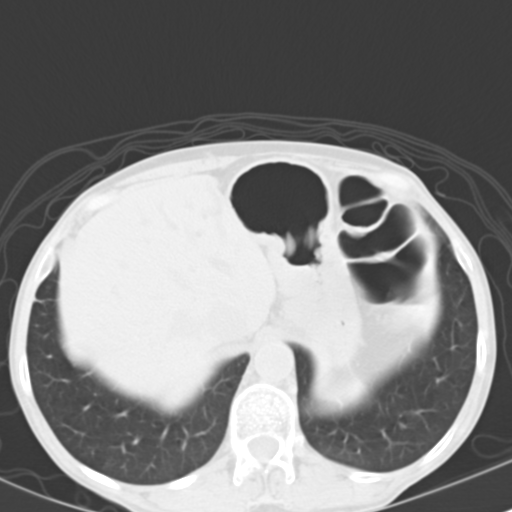
[im 76/83  lung]
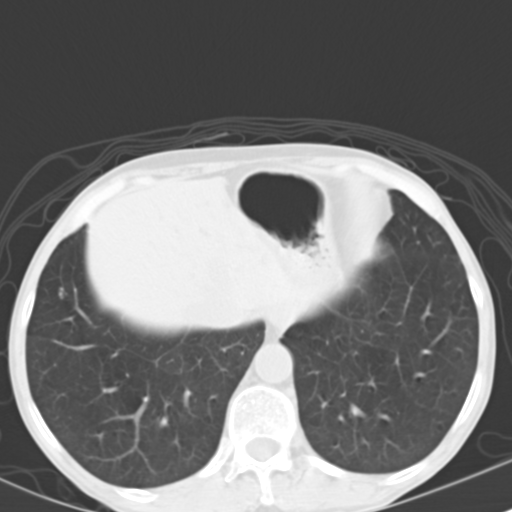
[im 79/83  soft-tissue]
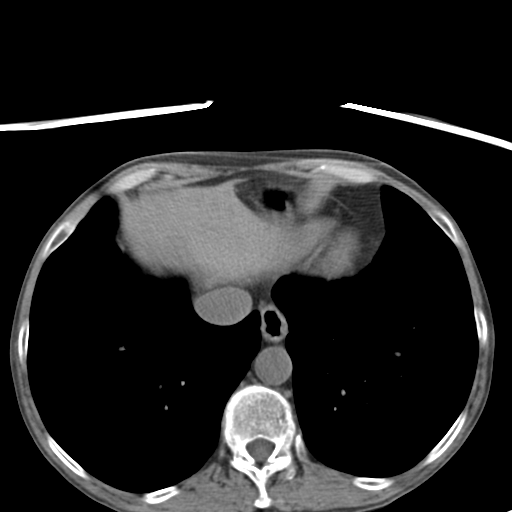
[im 79/83  lung]
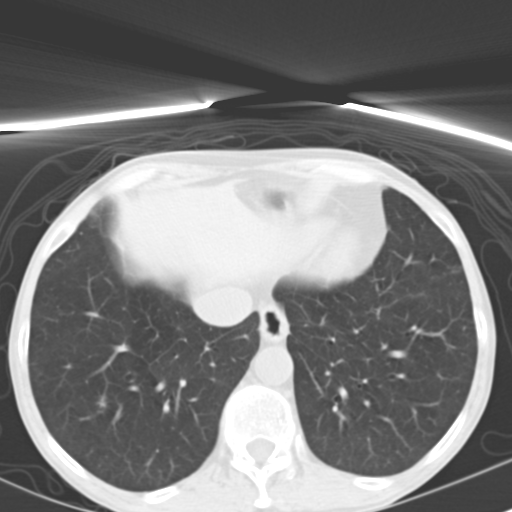

[14 of 32 positions shown; findings below may reference images not displayed]

PROCEDURE:     CT  - CT ABDOMEN AND PELVIS W[DATE]  [DATE]

RESULT:     Axial noncontrast CT scanning was performed through the abdomen
and pelvis at 5 mm intervals and slice thicknesses. Review of 3-dimensional
reconstructed images was performed separately on the [employer]
monitor. The scout film revealed considerable gaseous distention of bowel.

The CT images reveal that the orally administered contrast has reached the
transverse colon. These portions of bowel are moderately distended with gas.
The sigmoid colon and rectum are not contrast-filled. There is some stool
and gas present. I cannot exclude the possibility of there being a sigmoid
volvulus given the appearance of a suspected transition from the proximal
sigmoid to the mid sigmoid demonstrated best approximate images 48 through
57. The partially contrast-filled loops of small bowel are mildly
distended.. There is surgical suture material to the right of midline in the
upper pelvis. There is no significant free fluid in the abdomen or pelvis.
The urinary bladder is partially distended and grossly normal.

The gallbladder surgically absent. The liver exhibits no focal mass. There
is a small amount of intrahepatic ductal dilation. The common bile duct is
moderately dilated and has increased in conspicuity since the prior study
from February 2008. The spleen is apparently surgically absent. The pancreas,
kidneys, and adrenal glands are grossly normal. The caliber of the abdominal
aorta is normal. The lung bases are clear.
IMPRESSION: 1. There are findings which may reflect a sigmoid volvulus in the
appropriate clinical setting. I do not see a discrete mass to suggest an
obstructing mass resulting in the more proximal distention of the colon.
2. I do not see objective evidence of acute hepatobiliary abnormality. The
gallbladder is surgically absent. Mild intrahepatic ductal dilation is
present and there is moderate common bile ductal dilation. A definite
pancreatic mass is not demonstrated.

This report was discussed with Dr. Quirijn by telephone at [DATE] p.m. on 01 February, 2009.

## 2010-08-14 IMAGING — CR DG ABDOMEN 3V
1 series · 3 of 3 positions shown · non-contrast
Comparison: none

REASON FOR EXAM: followup on question of volvulus on CT,. h/o Crohn's
COMMENTS:

[Series 1: view not recorded · 0.17mm/px · 3 of 3 slices shown]
[im 1/3]
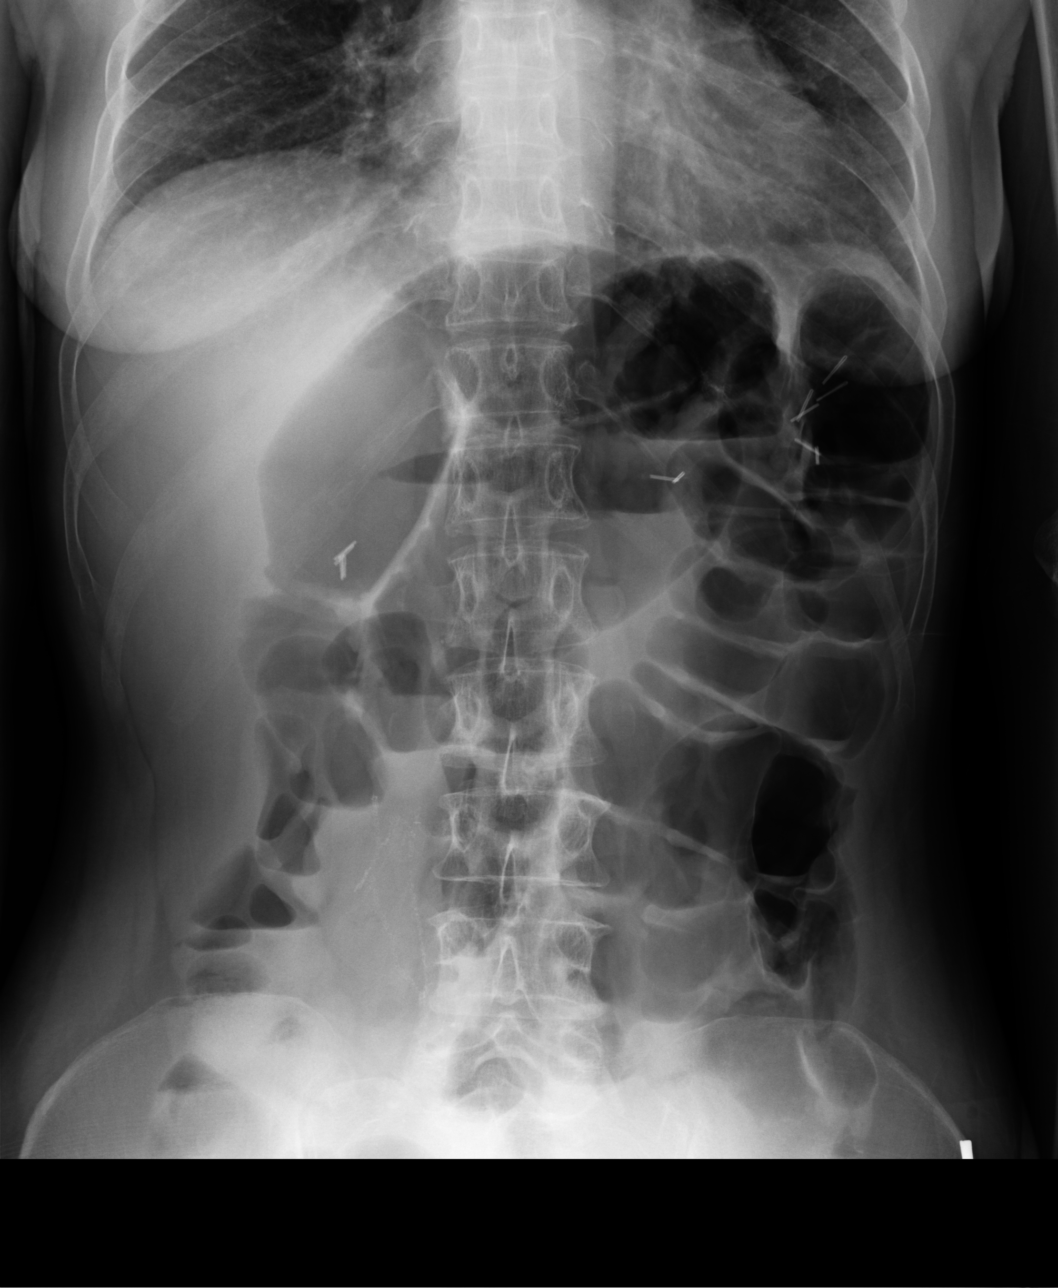
[im 2/3]
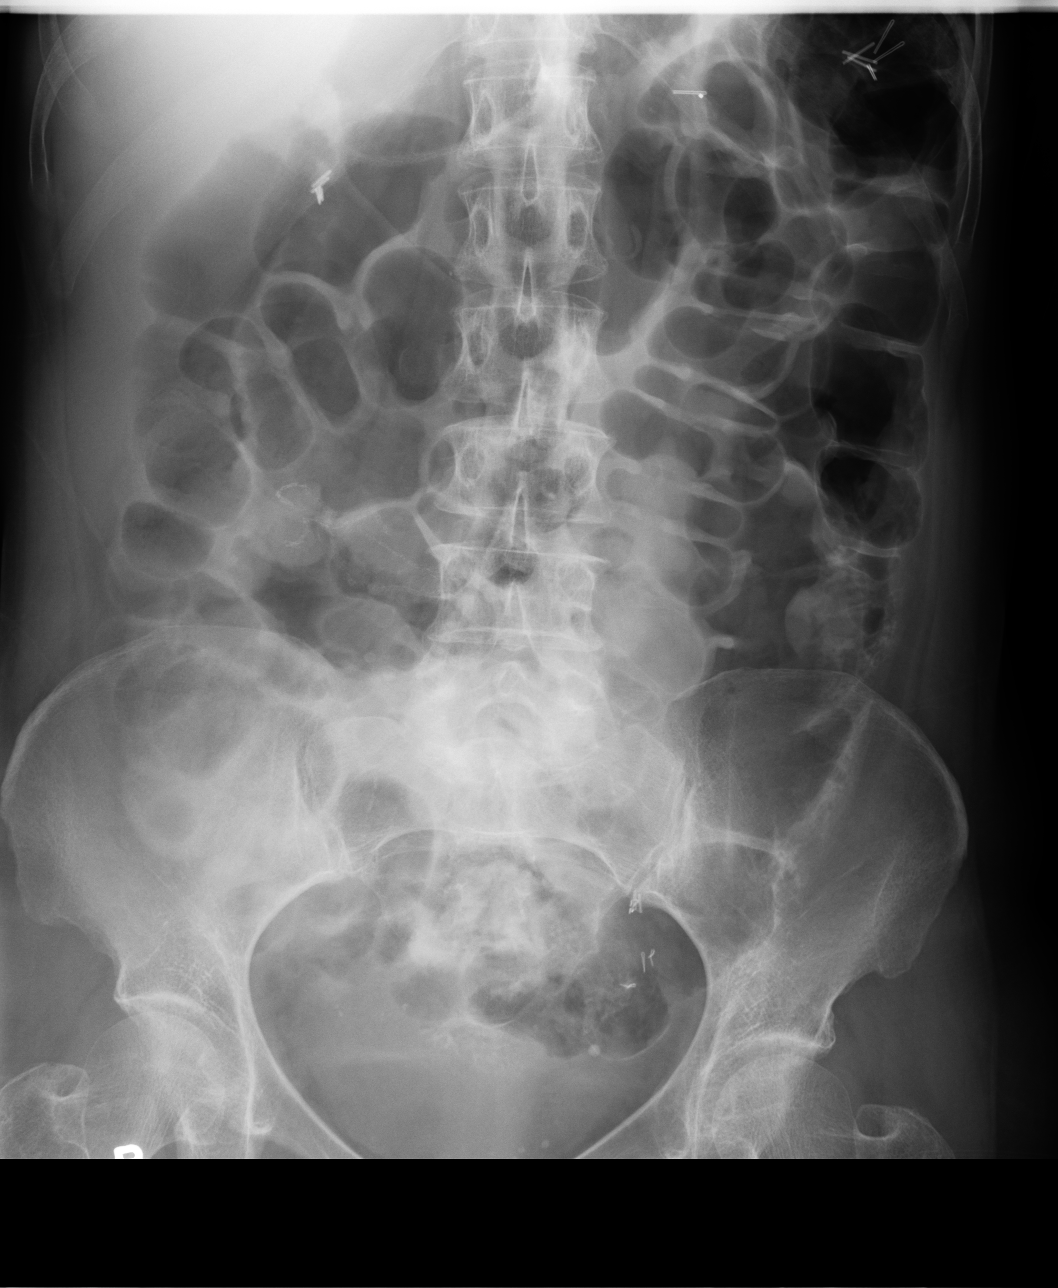
[im 3/3]
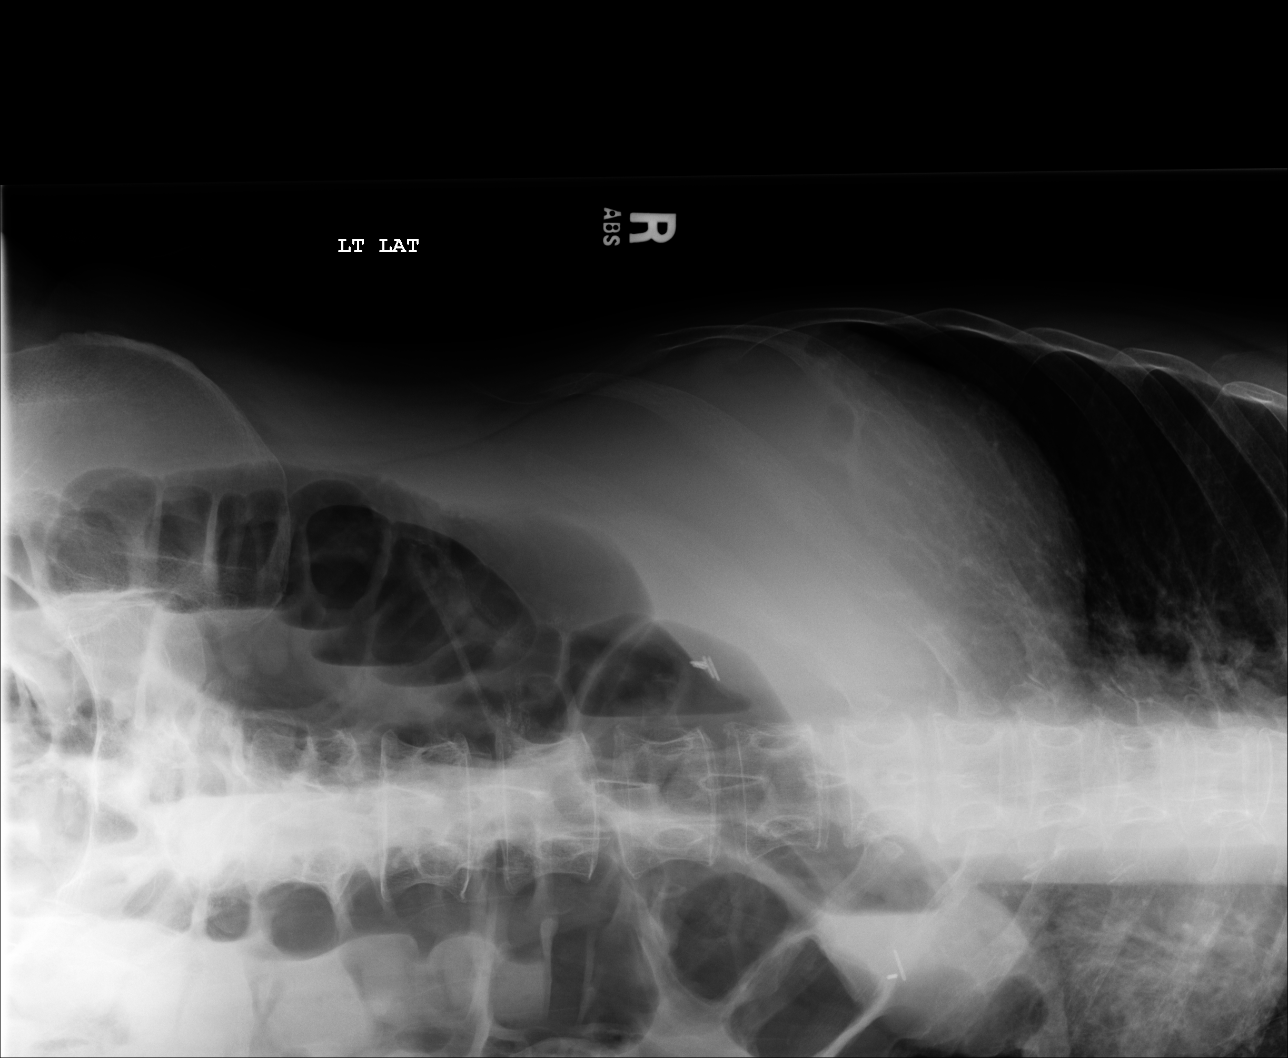

[3 of 3 positions shown; findings below may reference images not displayed]

PROCEDURE:     DXR - DXR ABDOMEN COMPLETE  - February 02, 2009  [DATE]

RESULT:     Flat, erect and lateral decubitus views of the abdomen were
obtained.

There is noted increased gas in loops of large and small bowel. There are
scattered fluid levels observed. No definite findings indicative of volvulus
are noted by routine radiography. The possibility of volvulus could,
however, be further evaluated by Barium Enema, if such is clinically
indicated. Post operative metallic clips are noted in multiple areas of the
abdomen. No abnormal intra-abdominal calcifications are seen.
IMPRESSION: 1.  There are nonspecific fluid levels observed in multiple loops of bowel.
2.  No definite findings of bowel obstruction are seen by routine
radiography.

## 2010-08-15 IMAGING — CR DG ABDOMEN 2V
1 series · 2 of 2 positions shown · non-contrast
Comparison: none

REASON FOR EXAM: partial large bowel obst
COMMENTS:   LMP: Post-Menopausal

[Series 1: view not recorded · 0.17mm/px · 2 of 2 slices shown]
[im 1/2]
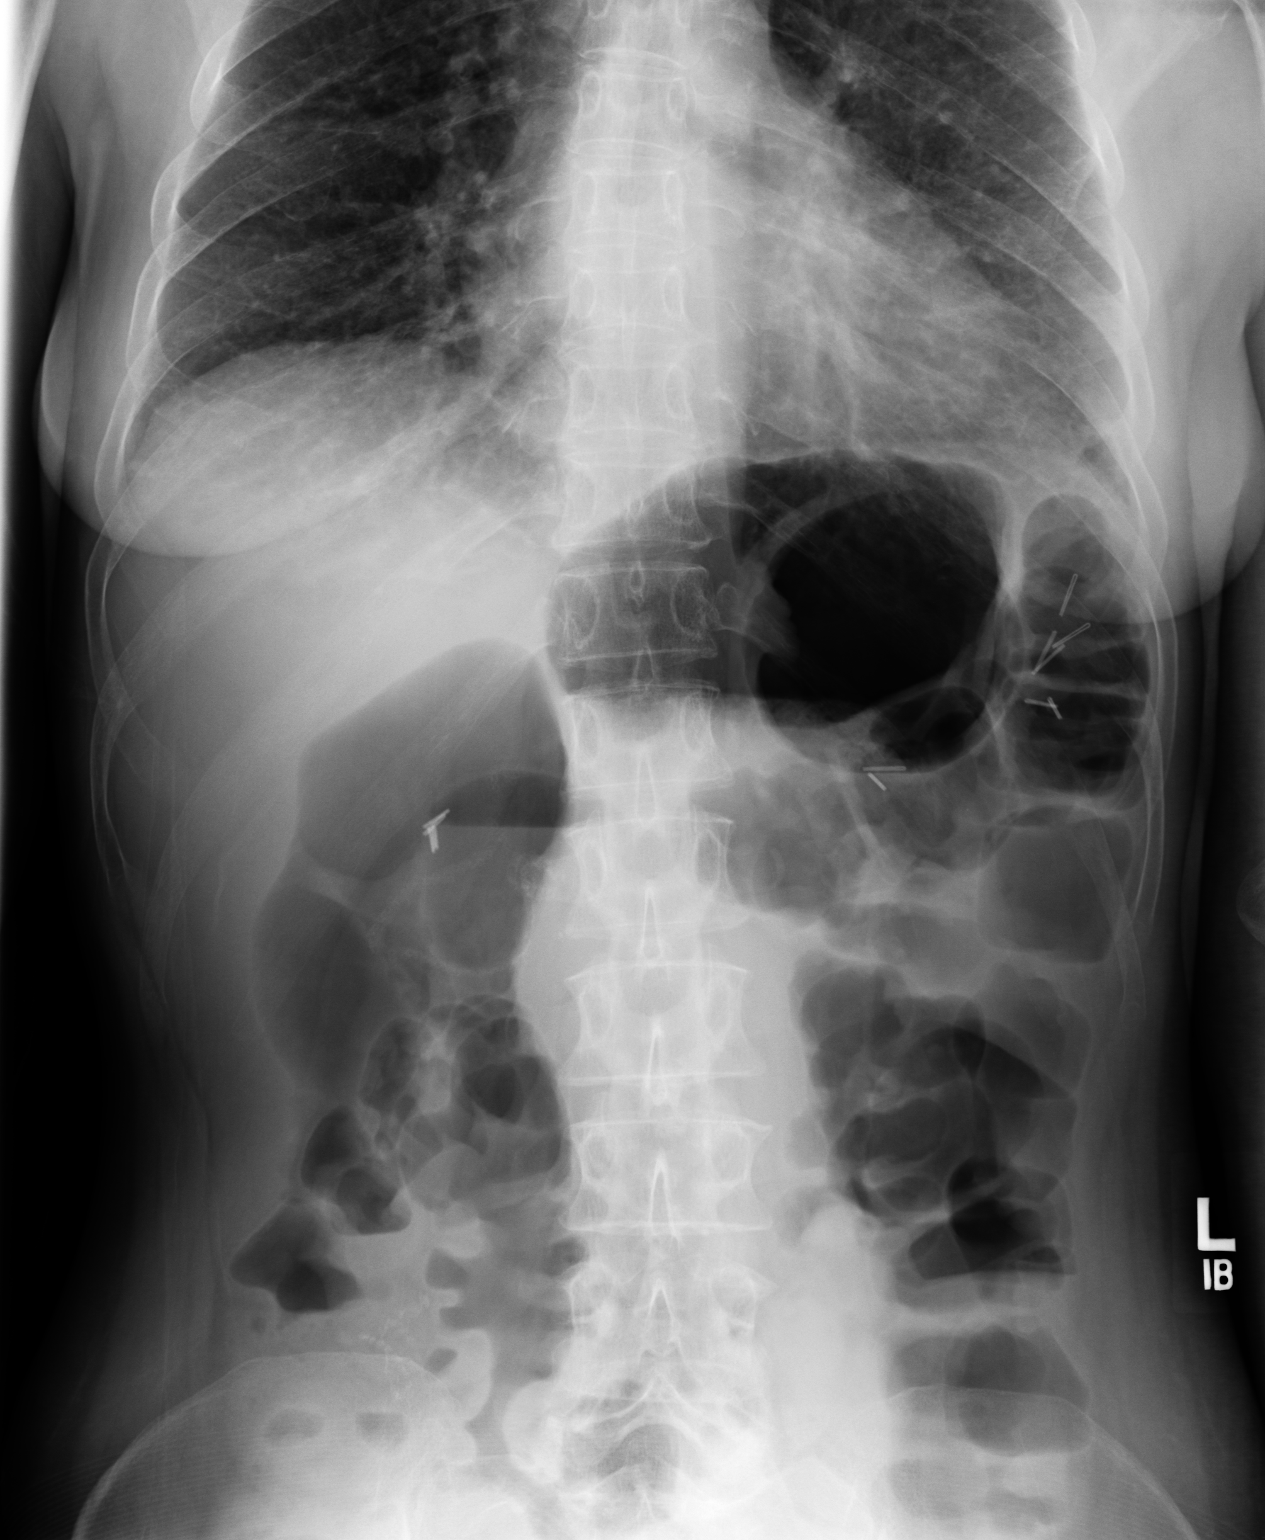
[im 2/2]
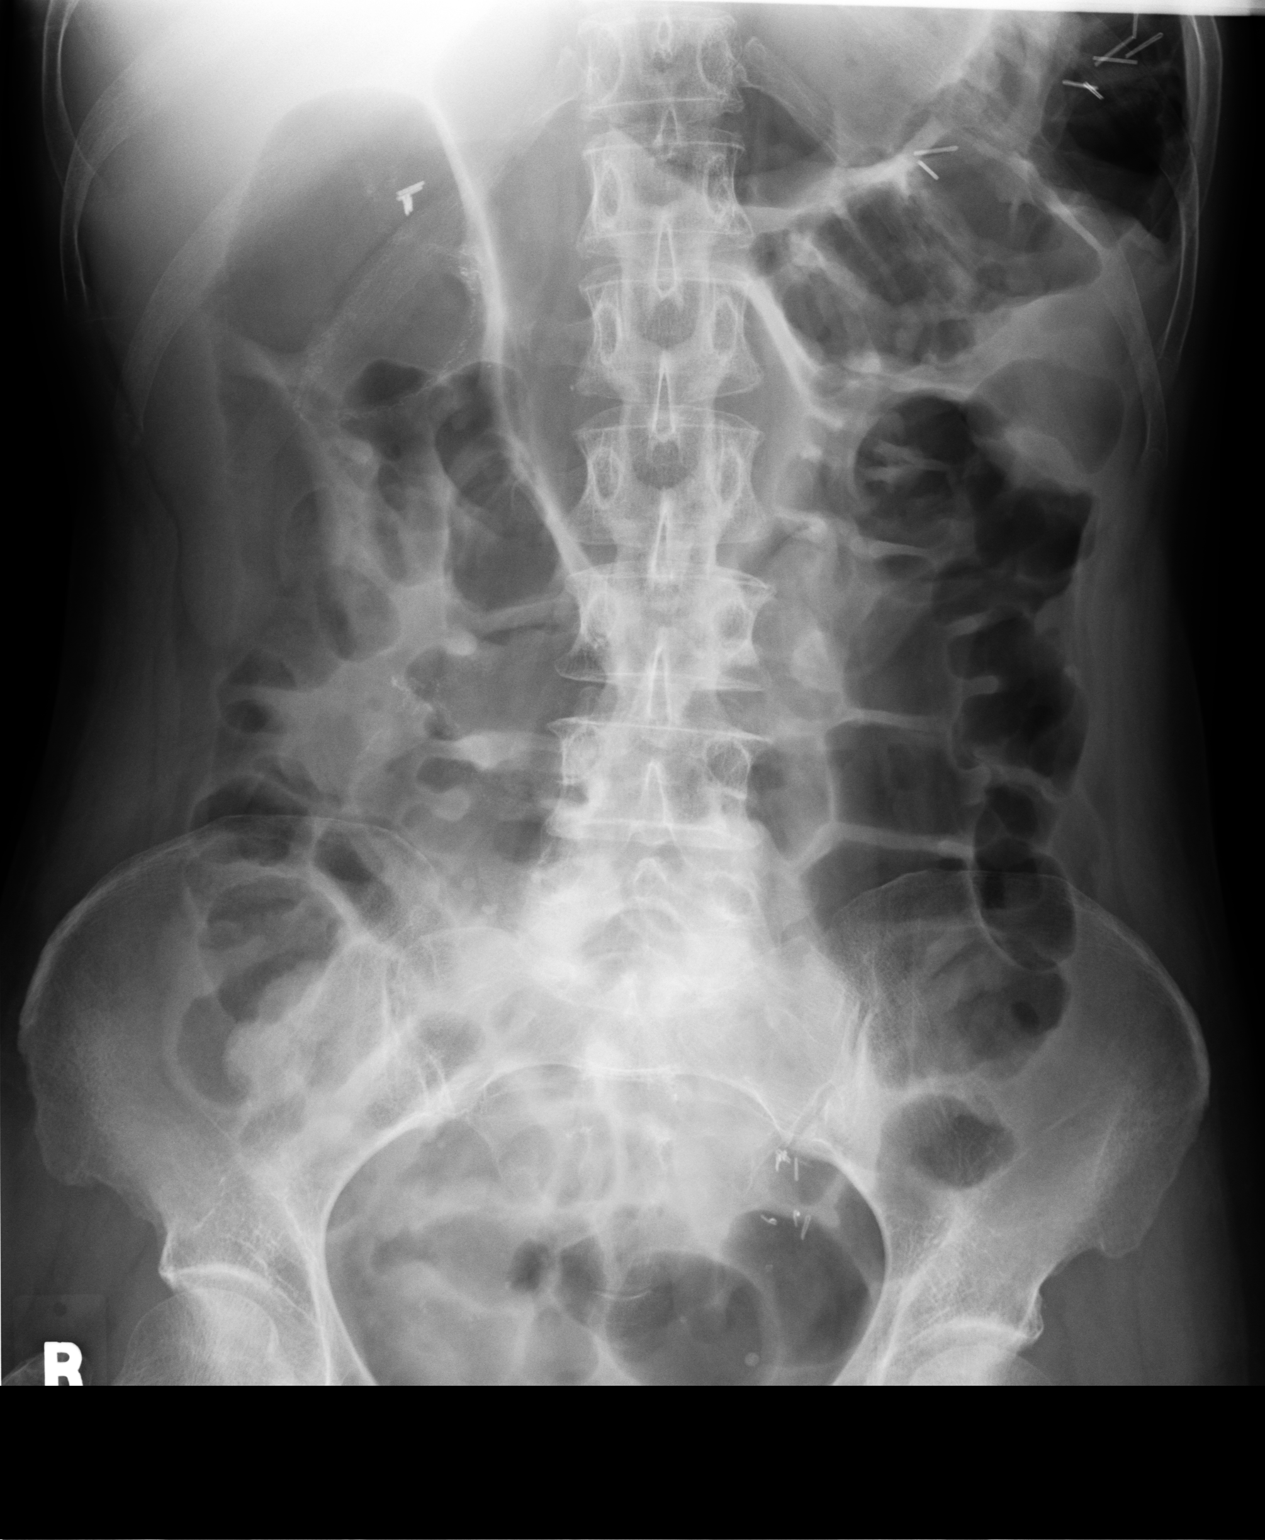

[2 of 2 positions shown; findings below may reference images not displayed]

PROCEDURE:     DXR - DXR ABDOMEN 2 V FLAT AND ERECT  - February 03, 2009  [DATE]

RESULT:     Comparison is made to the prior exam of 02/02/2009.

Again noted is mild to moderate dilatation of multiple loops of small bowel.
Scattered air/fluid levels are noted in the right abdomen. The air/fluid
levels are less prominent than on the prior examinations. There is a large
amount of colon gas. Post operative metallic clips are again seen
bilaterally. No abnormal intra-abdominal calcifications are identified.
IMPRESSION: There is improvement in the previously noted changes of partial
small bowel obstruction.

## 2010-08-16 IMAGING — CR DG ABDOMEN 2V
1 series · 2 of 2 positions shown · non-contrast
Comparison: none

REASON FOR EXAM: abd pain, partial volvulus
COMMENTS:   LMP: Post-Menopausal

[Series 1: view not recorded · 0.17mm/px · 2 of 2 slices shown]
[im 1/2]
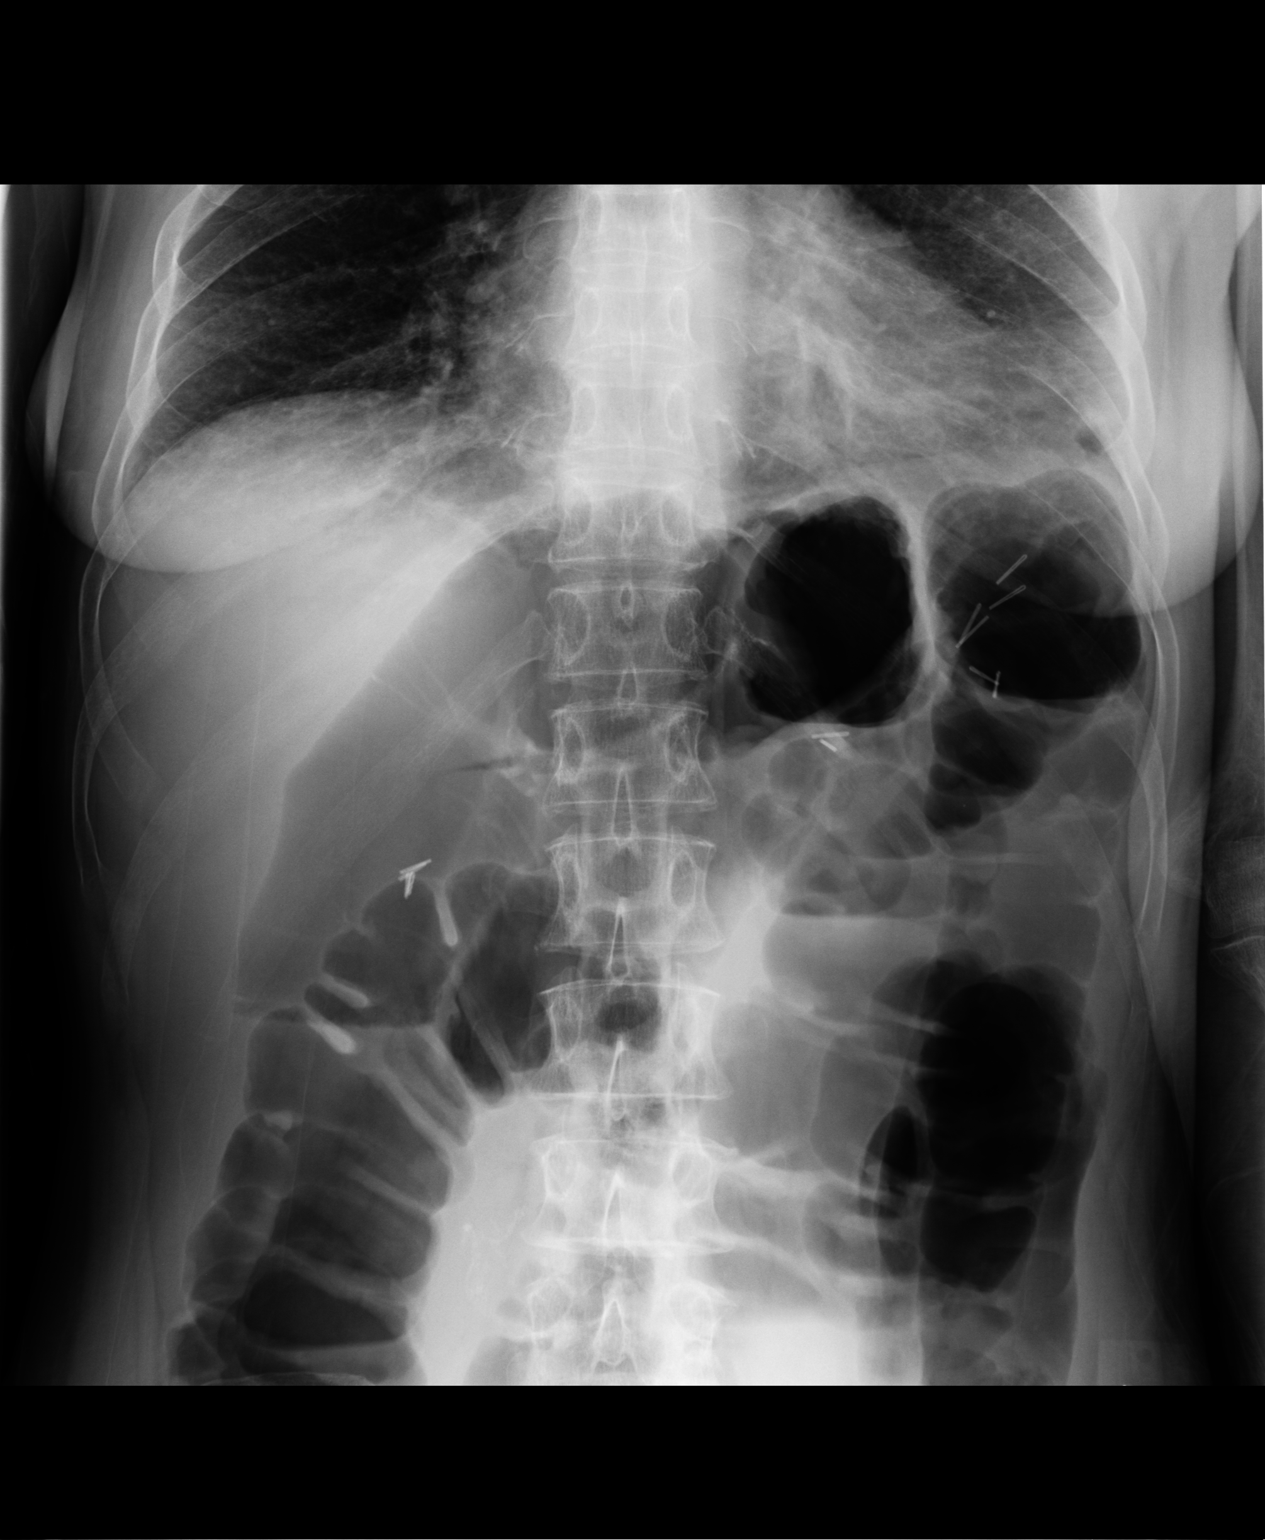
[im 2/2]
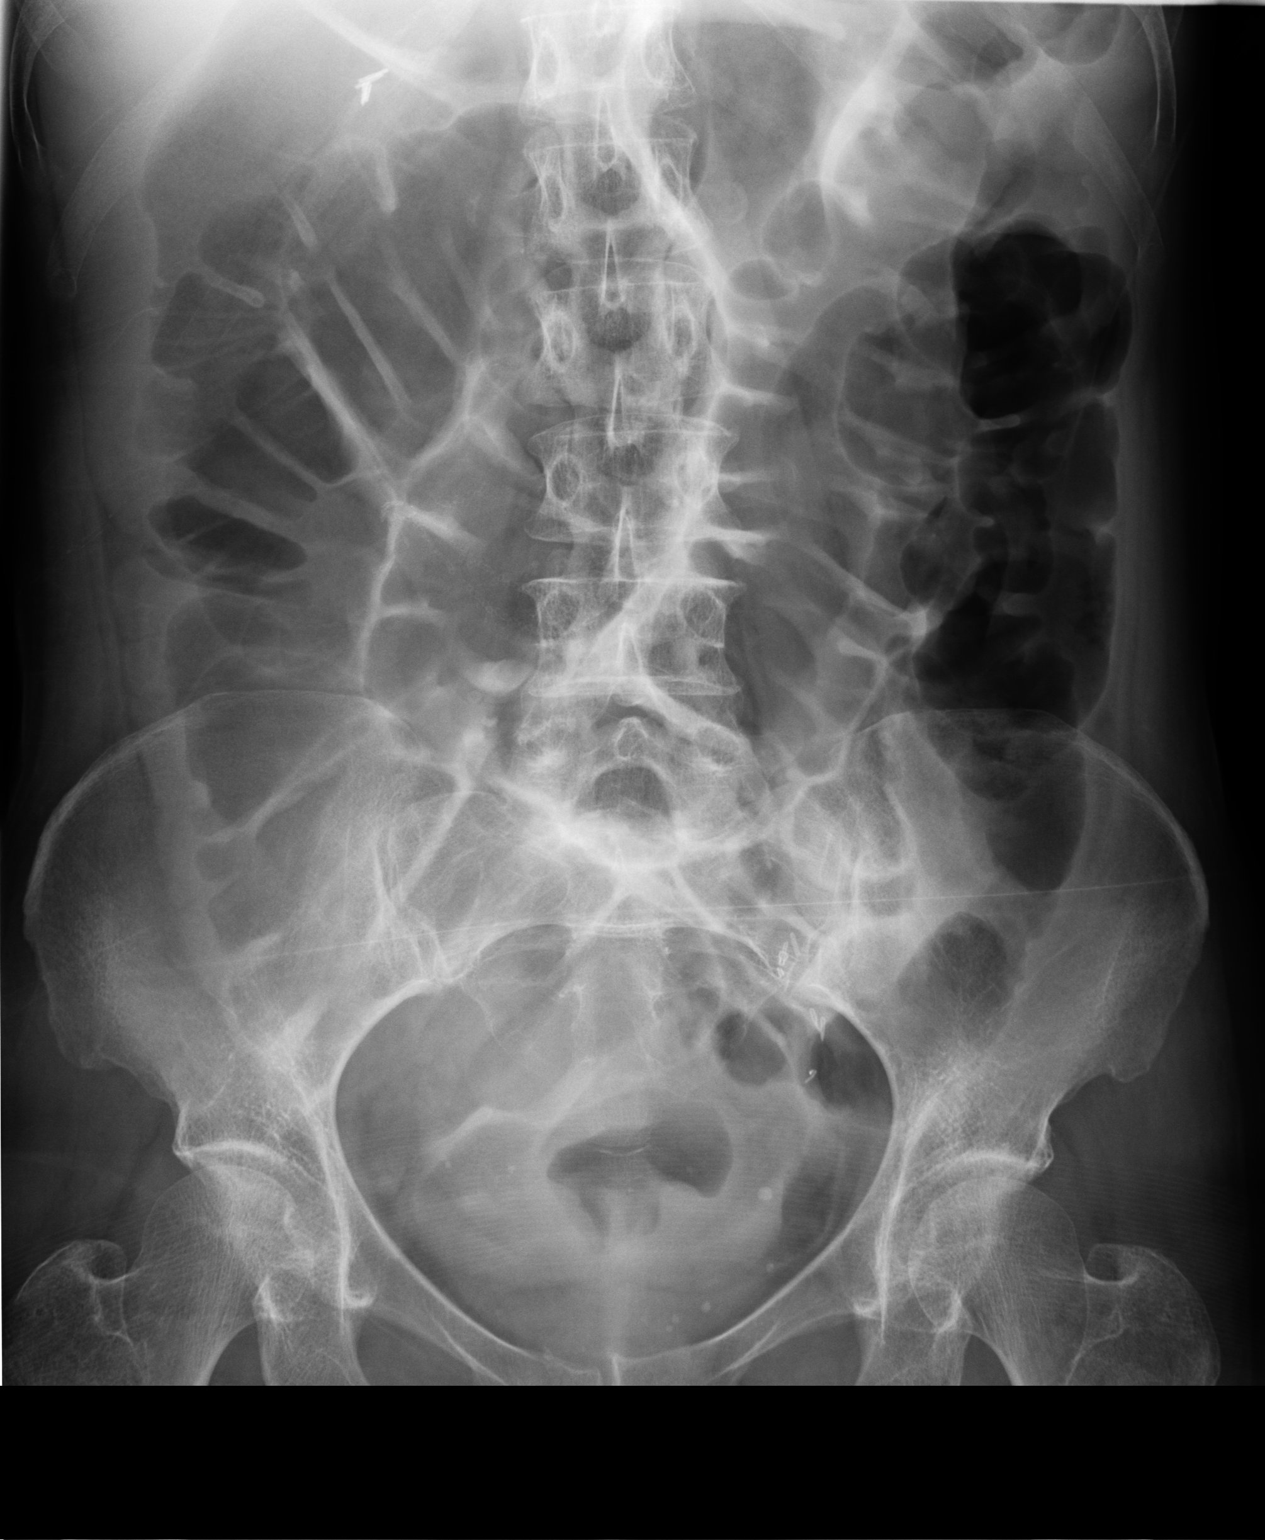

[2 of 2 positions shown; findings below may reference images not displayed]

PROCEDURE:     DXR - DXR ABDOMEN 2 V FLAT AND ERECT  - February 04, 2009 [DATE]

RESULT:     Comparison made to study 03 February, 2009.

There remains gaseous distention of the large bowel. Small bowel distention
is less conspicuous today. The volume of gas present has not greatly
changed. There is gas and stool in the region of the rectum. Surgical clips
are noted in the gallbladder fossa.
IMPRESSION: There remains considerable gas gaseous distention of the
colon without evidence of massively distended bowel loops. This may reflect
a colonic ileus or partial distal colonic obstruction. I do not see definite
evidence of small bowel obstruction.

## 2010-09-15 IMAGING — CR DG SMALL BOWEL
1 series · 3 of 3 positions shown · non-contrast
Comparison: none

REASON FOR EXAM: Crohn's Disease Nausea Abdominal Pain
COMMENTS:

[Series 1: view not recorded · 0.17mm/px · 3 of 3 slices shown]
[im 1/3]
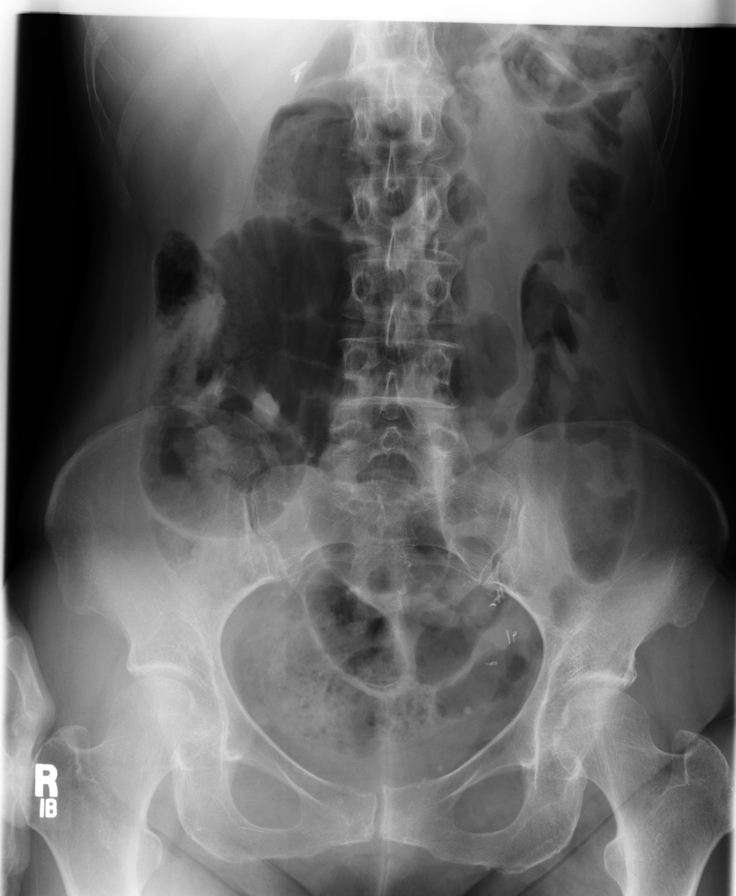
[im 2/3]
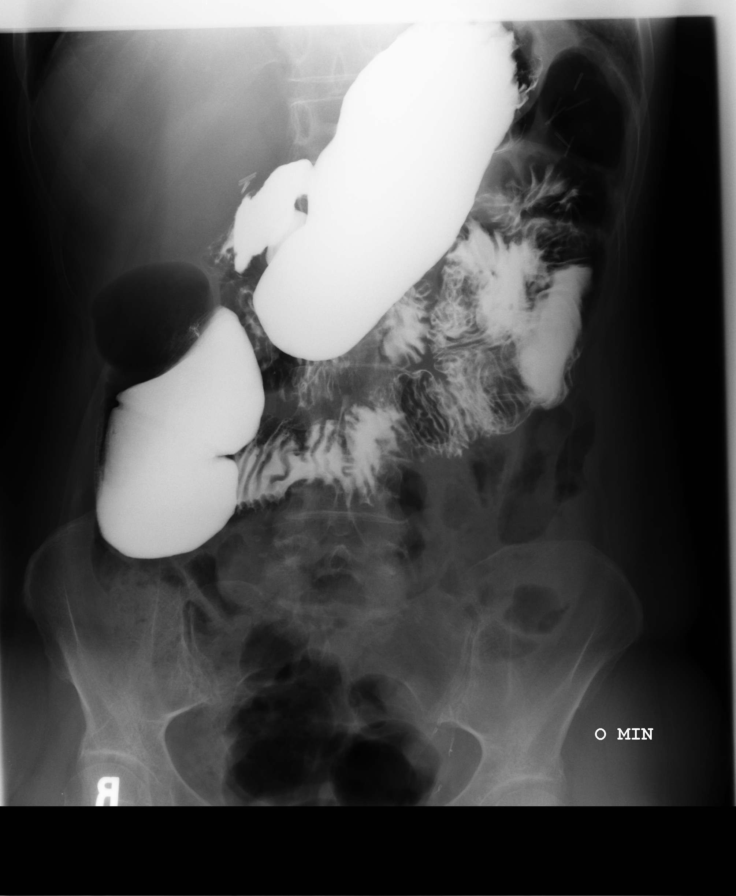
[im 3/3]
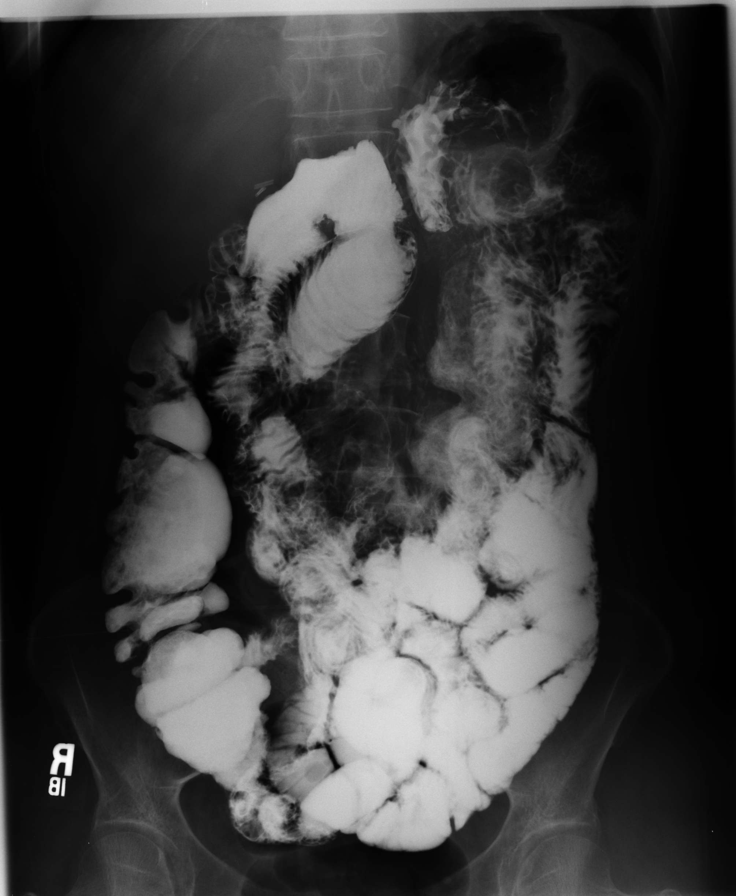

[3 of 3 positions shown; findings below may reference images not displayed]

PROCEDURE:     FL  - FL SMALL BOWEL  - March 06, 2009  [DATE]

RESULT:     The patient was given oral barium and the barium column was
followed through the small bowel.

An initial scout image was obtained. Air is seen within non-dilated loops of
large and small bowel. The distal stomach is distended with air.

The duodenum demonstrates normal course and there is appropriate placement
of the ligamentum of Treitz. Contrast transit time to the terminal ileum is
approximately 15 minutes. Focused quadrant evaluation of the bowel was
obtained. The terminal ileum is unremarkable without evidence of strictural
narrowing, filling defects nor focal outpouchings. The remaining components
of the small bowel demonstrate no radiographic abnormalities. There is no
evidence of focal outpouchings, strictural narrowing nor filling defects. A
focal distended loop of small bowel is appreciated within the RIGHT upper
quadrant. This loop of bowel collapses and distends appropriately and is
otherwise unremarkable.
IMPRESSION: 1. Unremarkable small bowel follow-through. The patient does demonstrate a
slightly rapid transit time though this may be secondary to the patient's
history of partial bowel resection. The terminal ileum is unremarkable.

## 2010-11-12 IMAGING — CR DG FOOT COMPLETE 3+V*L*
1 series · 3 of 3 positions shown · non-contrast
Comparison: None

REASON FOR EXAM: knee pain & foot injury
COMMENTS:

PROCEDURE:     DXR - DXR FOOT LT COMP W/OBLIQUES  - May 03, 2009 [DATE]
RESULT:     History: Pain

[Series 1: view not recorded · 0.17mm/px · 3 of 3 slices shown]
[im 1/3]
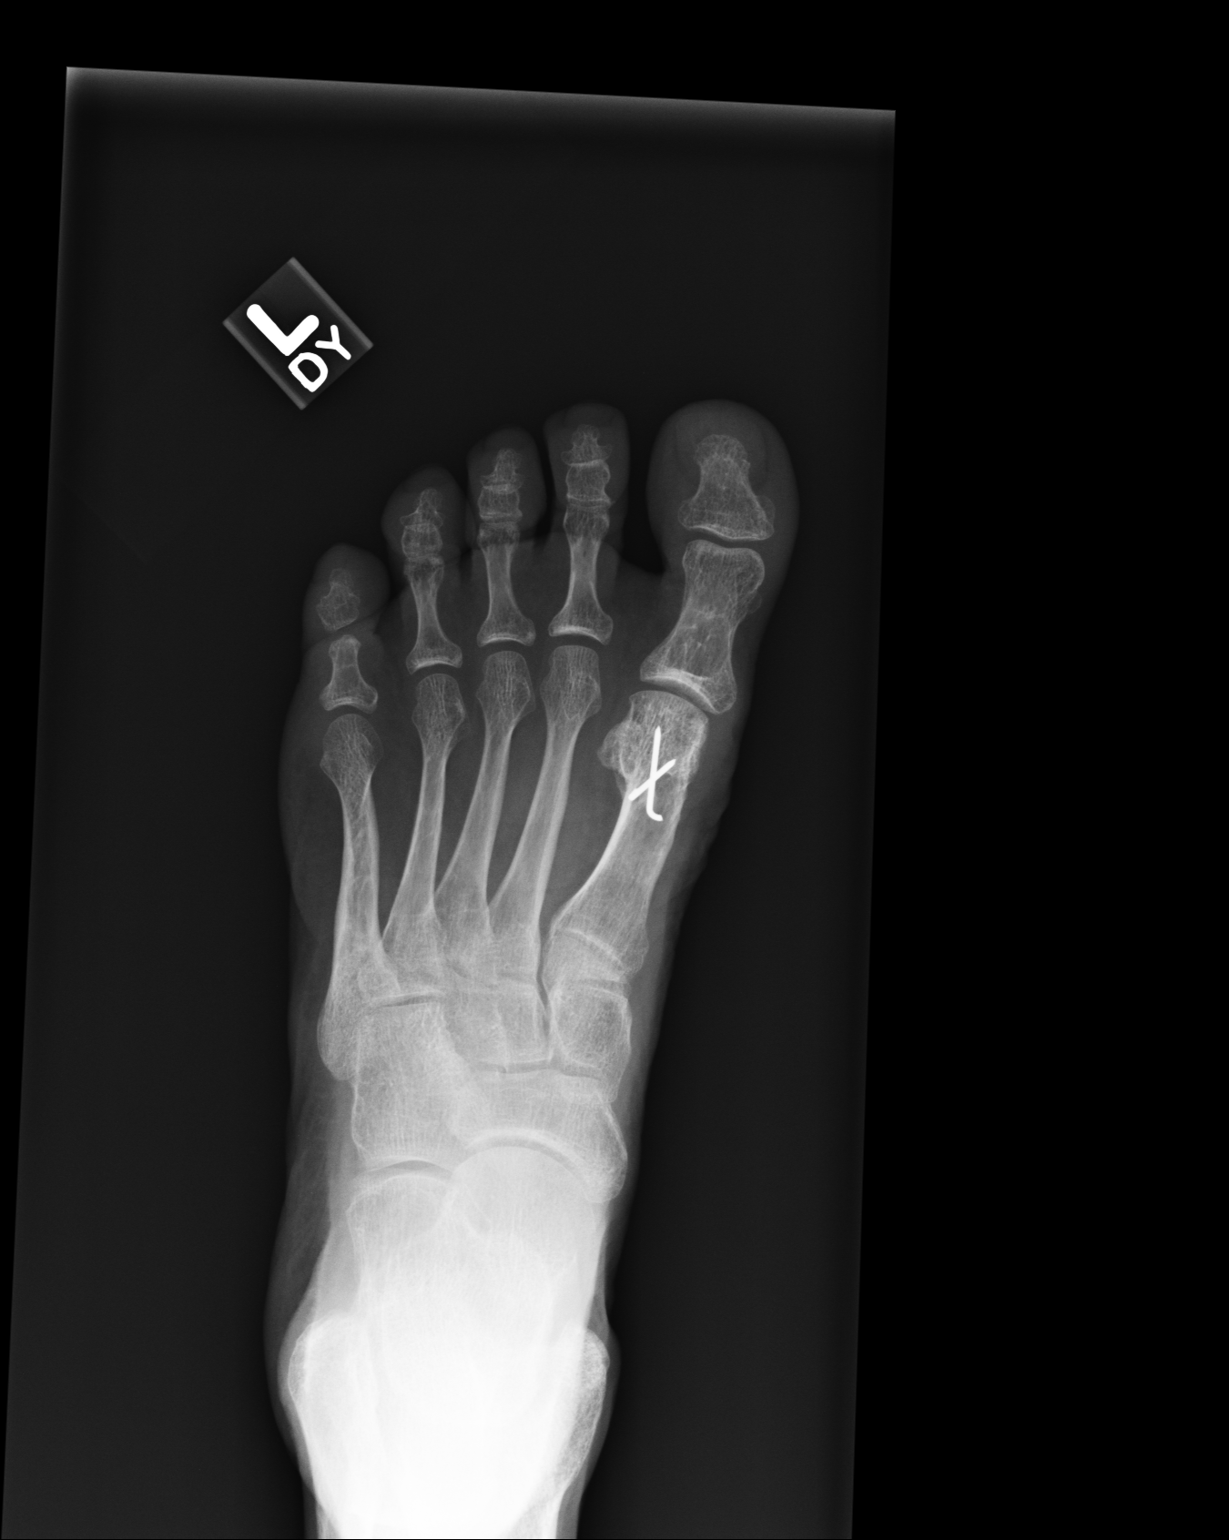
[im 2/3]
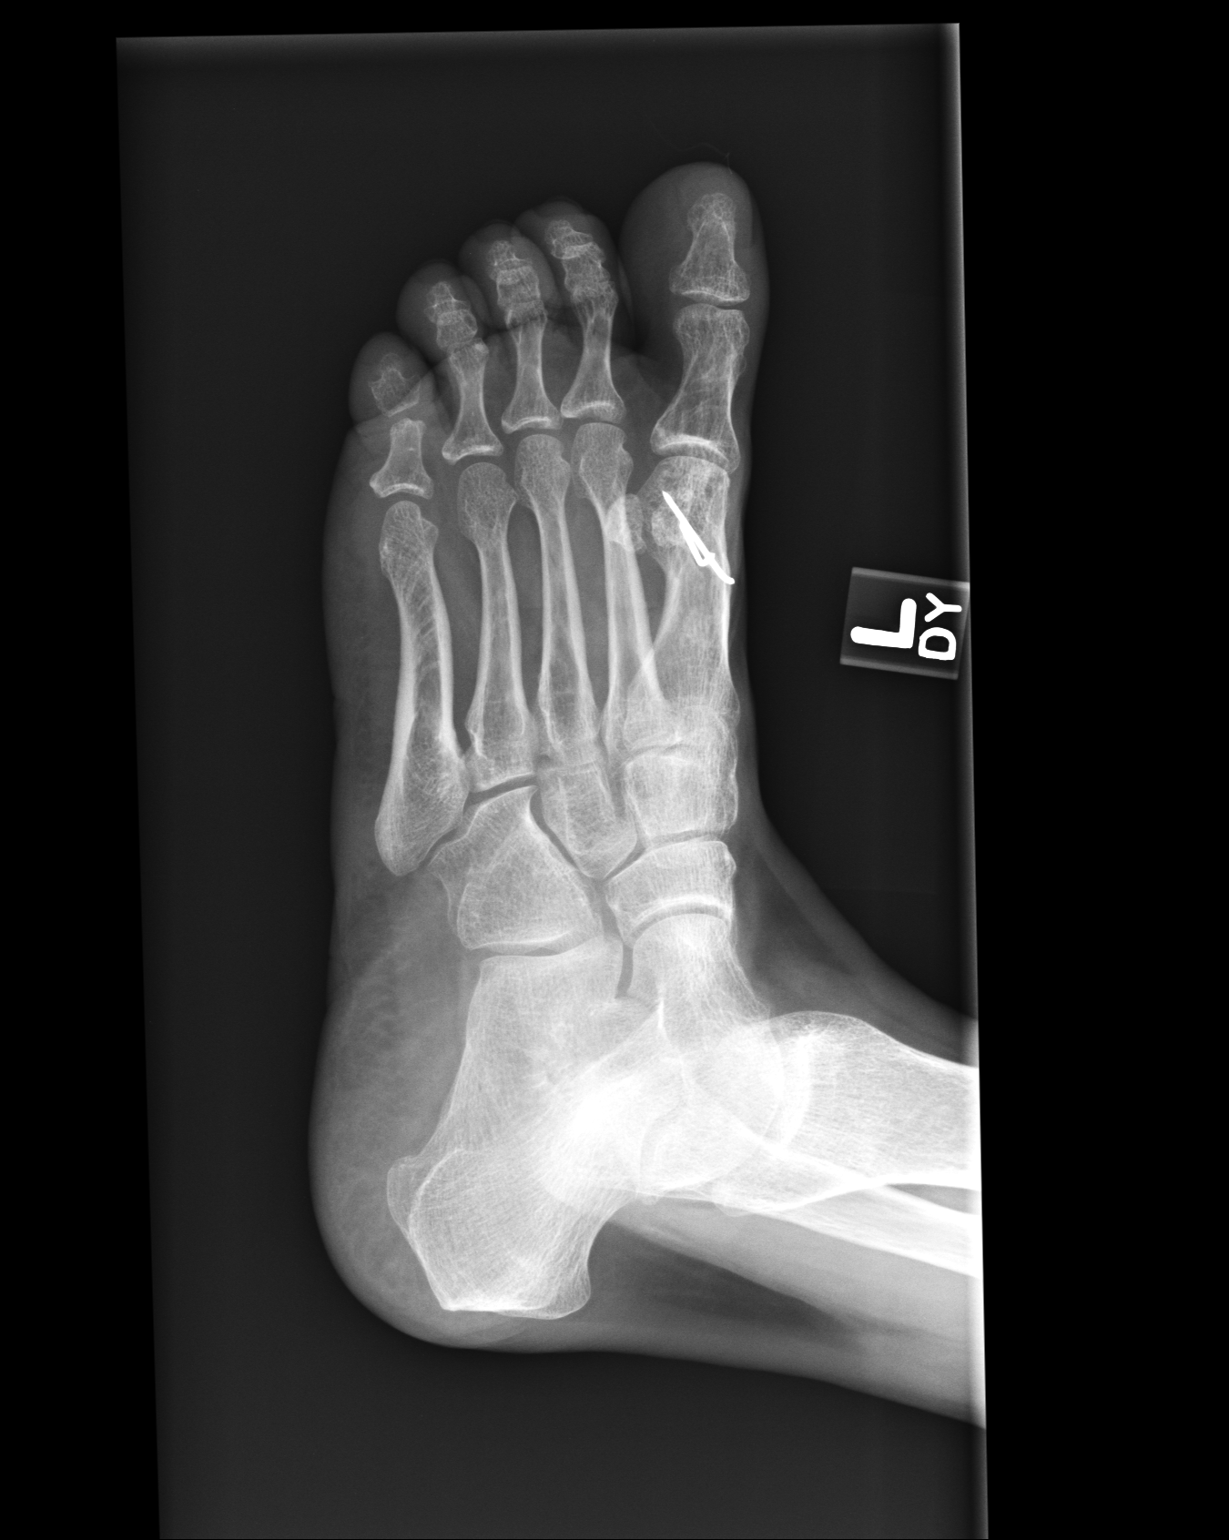
[im 3/3]
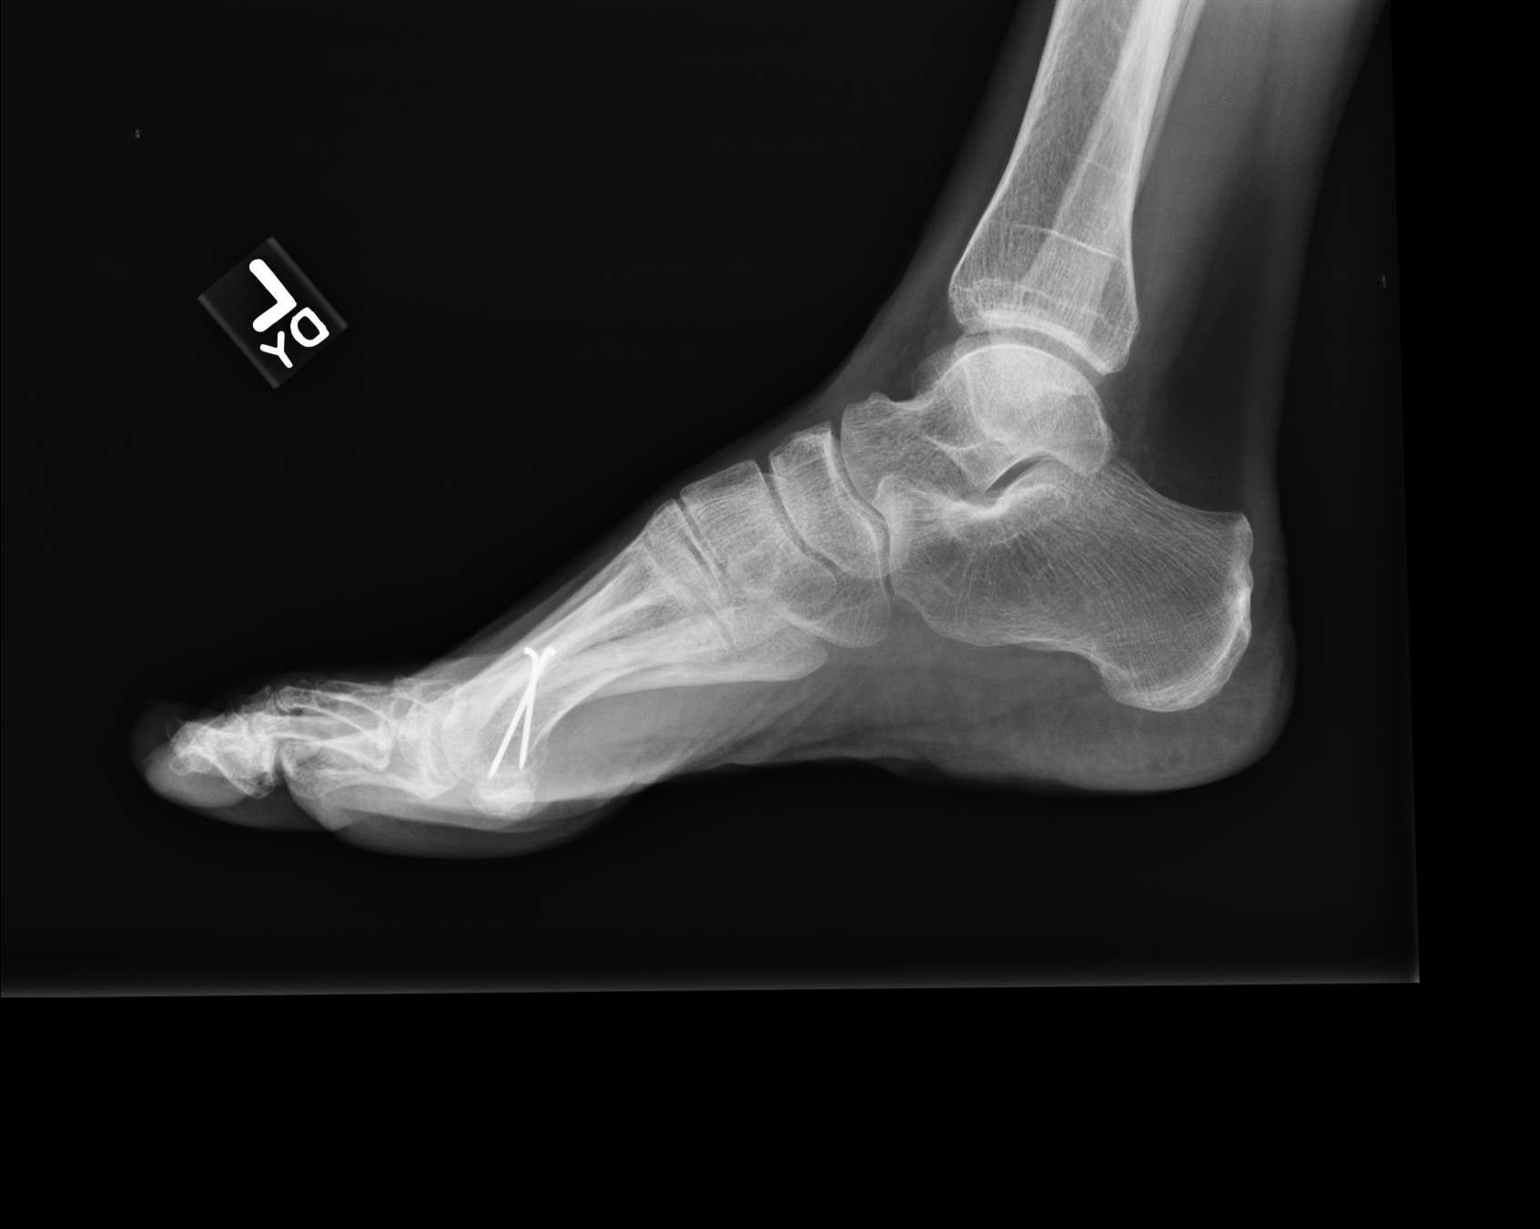

[3 of 3 positions shown; findings below may reference images not displayed]

FINDINGS: AP, oblique, and lateral views of the left foot demonstrates no fracture or
dislocation. There is evidence of prior first metatarsal osteotomy with 2 K
wires in place. The osteotomy site appears healed. There is generalized
osteopenia. There is no soft tissue abnormality. There is no subcutaneous
emphysema or radiopaque foreign bodies.
IMPRESSION: No acute osseous injury of the left foot.

## 2011-01-01 ENCOUNTER — Ambulatory Visit: Payer: Self-pay | Admitting: Unknown Physician Specialty

## 2011-03-26 NOTE — Op Note (Signed)
Tamara [name], Tamara [name]              ACCOUNT NO.:  [id number]   MEDICAL RECORD NO.:  [id number]          PATIENT TYPE:  AMB   LOCATION:  [facility] [facility]                          FACILITY:  [facility]   PHYSICIAN:  Robert A. Noemi Chapel, M.D. DATE OF BIRTH:  [birth year]   DATE OF PROCEDURE:  05/03/2008  DATE OF DISCHARGE:                               OPERATIVE REPORT   PREOPERATIVE DIAGNOSES:  1. Right shoulder rotator cuff tear.  2. Right shoulder partial labrum tear.  3. Right shoulder impingement.  4. Right shoulder acromioclavicular joint degenerative joint disease      and spurring.   POSTOPERATIVE DIAGNOSES:  1. Right shoulder rotator cuff tear.  2. Right shoulder partial labrum tear.  3. Right shoulder impingement.  4. Right shoulder acromioclavicular joint degenerative joint disease      and spurring.   PROCEDURE:  1. Right shoulder EOA from arthroscopic resisted rotator cuff repair      using Arthrex Suture Anchor x1.  2. Right shoulder debridement of partial labrum tear.  3. Right shoulder subacromial decompression.  4. Right shoulder distal clavicle excision.   SURGEON:  Lorn Junes, MD.   ASSISTANT:  Vonda Antigua, PA.   ANESTHESIA:  General.   OPERATIVE TIME:  50 minutes.   COMPLICATIONS:  None.   INDICATIONS FOR PROCEDURE:  Tamara [name] is a 65 year old woman who has  had significant pain in her right shoulder for the past 4-5 months  increasing in nature with exam and MRI documenting a rotator cuff tear,  partial labrum tear with impingement with AC joint arthropathy.  She has  failed conservative care and is now to undergo arthroscopy and repair.   DESCRIPTION:  Tamara [name] was brought to the operating on May 03, 2008.  After an interscalene block was placed in holding area by anesthesia,  she was placed on operating table in supine position.  After being  placed under general anesthesia, she received Ancef 1 g IV  preoperatively for prophylaxis.  She was then placed  in beach-chair  position and her shoulder and arm was prepped using sterile DuraPrep and  draped using sterile technique.  Initially, through a posterior  arthroscopic portal, the arthroscope with a pump attached was placed and  through an anterior portal, an arthroscopic probe was placed.  On  initial inspection, the articular cartilage in the glenohumeral joint  was found to be intact.  She had partial tear in the anterior, superior  and posterior labrum 25%-30%, which was debrided.  Anterior-inferior  labrum and anterior-inferior glenohumeral ligament complex was intact.  Superior labrum, biceps, tendon, and ankle was intact.  Biceps tendon  was intact.  The rotator cuff showed a complete tear of the  supraspinatus and the partial tear of the infraspinatus and this was  partially debrided arthroscopically.  The rest of the rotator cuff was  intact.  The inferior capsular recess free of pathology.  Subacromial  space was entered and a lateral arthroscopic portal was made.  Moderately thickened bursitis was resected, impingement was noted and a  subacromial decompression was carried out removing 6-8 mm of the  undersurface of the anterolateral and anteromedial acromion and CA  ligament release carried out as well.  The [facility] joint showed significant  spurring and degenerative changes and distal 6 mm of clavicle was  resected with a 6-mm bur.  After this was done, an accessory lateral  portal was placed and the rotator cuff tear was further visualized and  debrided.  Then, an Arthrex 5.5-mm suture anchor was placed in the  greater tuberosity.  Each of the sutures in this anchor were passed  arthroscopically in a mattress suture technique and then tied down with  a firm and tight repair.  After this was done, the shoulder could be  brought through a full range of motion with no impingement on the  repair.  At this point, it was felt that all the pathology have been  satisfactorily addressed.   The instruments were removed.  Portals were  closed with 3-0 nylon suture.  Sterile dressings and a sling applied and  the patient awakened and taken to recovery room in stable condition.   FOLLOWUP CARE:  Tamara [name] to be followed overnight in the [facility] for IV pain control neuromuscular monitoring.  Discharge tomorrow  on Percocet and Robaxin.  Begin early physical therapy.  She will be  back in office in a week for sutures out and followup.      Robert A. Noemi Chapel, M.D.  Electronically Signed     RAW/MEDQ  D:  05/03/2008  T:  05/04/2008  Job:  [id number]

## 2011-03-29 NOTE — Op Note (Signed)
NAME:  Tamara [name], Tamara [name]                    ACCOUNT NO.:  [id number]   MEDICAL RECORD NO.:  [id number]                   PATIENT TYPE:  AMB   LOCATION:  DAY                                  FACILITY:  [facility]   PHYSICIAN:  Domingo Pulse, M.D.               DATE OF BIRTH:  [birth year]   DATE OF PROCEDURE:  04/06/2003  DATE OF DISCHARGE:                                 OPERATIVE REPORT   PREOPERATIVE DIAGNOSES:  1. Duplicated system, right collecting system.  2. Two stones, right lower pole ureter.   POSTOPERATIVE DIAGNOSES:  1. Duplicated collecting system, right.  2. Probable passed stones, lower pole collecting system, right.   PROCEDURES:  Cystoscopy, right retrograde, right ureteroscopy, right double  J catheter insertion.   SURGEON:  Domingo Pulse, M.D.   ANESTHESIA:  General.   COMPLICATIONS:  None.   DRAINS:  A 26 French x 24 cm double J catheter.   BRIEF HISTORY:  This 65 year old female underwent a CT scan over in  [place] and was told that she had a duplicated system on the right-hand  side and that the lower pole segment had hydronephrosis secondary to two  stones in the ureter.  She was told that the stones were small and would  likely pass.  The patient claims the stones have not passed and still had  continuing problems with pain.  The patient's KUB is nondiagnostic.  CT scan  was made available to me by disk and does show what appears to be a  duplicated collecting system and probable stones in the ureter.  The patient  is still having pain and wishes to have the stones removed.  She understands  the risks and benefits of the procedure and gave full and informed consent.   DESCRIPTION OF PROCEDURE:  After the successful induction of general  anesthesia, the patient was placed in the dorsal lithotomy position and  prepped with Betadine and draped in the usual sterile fashion.  Cystoscopy  was performed and the bladder was carefully inspected.  It was  free of any  tumor or stones.  The left ureteral orifice was normal in its appearance.  On the right-hand side, one ureteral orifice could be seen very laterally.  Based on the Meyer-Weigert law, this would have to be the opening for the  lower pole segment.  The upper pole segment should be more medially placed.  While there was an area that appeared to be consistent with a possible  ureteral opening, this could not be cannulated and it appears to be very  atretic.  The area was inspected and nothing was seen to come out of the one  ureteral opening but to drain the upper pole segment.  The retrograde study  performed on the lower pole segment confirmed the fact that this was a  duplicated system with a classic drooping lily sign without any filling of  an upper pole segment.  There was moderate hydronephrosis but no real  filling defects could be seen.  The distal ureter was then dilated with a  balloon dilator.  The ureteroscope was then inserted along a guidewire and  negotiated all the way up to the UPJ.  Because of the angulation caused by  the drooping lily sign, this could not be passed into the kidney proper but  the entire ureter was visualized.  It was not felt that flexible  ureteroscopy to view the inside of the pelvis was necessary.  The  ureteroscope was withdrawn and everything appeared to be wide open.  The  decision was made to place the double J catheter with the though that  perhaps the patient had residual pain following passage of the stone and  that with time this will settle down and the patient should be pain-free.  The cystoscope was back-loaded over the wire.  The double J catheter was  passed up to the kidney, where it coiled in the lower pole segment and also  in the bladder.  An additional attempt to try and cannulate an upper pole  ureter was unsuccessful.  The opposite side, however, was completely normal.  The bladder was drained and the patient was taken to  the recovery room in  good condition.  She will be sent home with Lorcet Plus, Pyridium Plus, and  Septra, and will return to see me in two weeks' time.                                               Domingo Pulse, M.D.    RJE/MEDQ  D:  04/06/2003  T:  04/06/2003  Job:  [id number]

## 2011-03-29 NOTE — Op Note (Signed)
[name], [name]              ACCOUNT NO.:  [id number]   MEDICAL RECORD NO.:  [id number]          PATIENT TYPE:  AMB   LOCATION:  [place]                          FACILITY:  [facility]   PHYSICIAN:  Robert A. Noemi Chapel, M.D. DATE OF BIRTH:  Feb 03, 1946   DATE OF PROCEDURE:  02/18/2005  DATE OF DISCHARGE:                                 OPERATIVE REPORT   PREOPERATIVE DIAGNOSIS:  Left shoulder partial rotator cuff tear, partial  labrum tear with impingement.   POSTOPERATIVE DIAGNOSIS:  Left shoulder partial rotator cuff tear, partial  labrum tear with impingement.   PROCEDURE:  1.  Left shoulder EUA followed by arthroscopic debridement, partial rotator      cuff tear, partial labrum tear.  2.  Left shoulder subacromial decompression.   SURGEON:  Audree Camel. Noemi Chapel, M.D.   ASSISTANT:  Matthew Saras, P.A.   ANESTHESIA:  General.   OPERATIVE TIME:  45 minutes.   COMPLICATIONS:  None.   INDICATIONS FOR PROCEDURE:  Tamara [name] is a 67-year woman who has had  significant left shoulder pain for the past eight to 12 months increasing in  nature with exam and MRI documenting partial rotator cuff tear, partial  labrum tear with impingement and with possible adhesive capsulitis. She has  failed conservative care and is now to undergo arthroscopy.   DESCRIPTION:  Tamara [name] was brought to operating room on February 18, 2005,  placed on the operating table in supine position. After adequate level of  general anesthesia was obtained, her left shoulder was examined. She had  full range of motion with no signs of adhesive capsulitis. Her shoulder was  stable to ligamentous exam. She was then placed in beach chair position and  her shoulder was sterilely injected with 0.25% Marcaine with epinephrine.  The left shoulder and arm was then prepped using sterile DuraPrep and draped  using sterile technique. Originally through a posterior arthroscopic portal,  the arthroscope with a pump attached was  placed and through an anterior  portal, and arthroscopic probe was placed. On initial inspection the  articular cartilage in the glenohumeral joint was found to be intact.  Anterior labrum partial tearing 25% which was debrided, inferior labrum and  anterior inferior glenohumeral ligament complex was intact. Superior labrum,  biceps tendon anchor was intact. The biceps tendon was intact. The rotator  cuff showed partial tear 30-40% of the supraspinatus and infraspinatus and  this was debrided. The rest of the rotator cuff was intact. Inferior  capsular recess free of pathology. Subacromial space showed a large amount  of bursitis which was resected.  Rotator cuff was partially torn on the  bursal surface 20% but still had satisfactory attachment points to the  greater tuberosity. Impingement was noted.  A subacromial decompression was  carried out removing 5-6 mm of the undersurface of the anterior,  anterolateral, anteromedial acromion.  CA ligament release carried out as  well. The [facility] joint was not impinging on motion and was not disturbed. After  the decompression been carried out, the shoulder could be brought through a  full range of motion with no  impingement on the rotator cuff.  At this point  it was felt that all pathology been satisfactorily addressed. The  instruments were removed. Portals closed with 3-0 nylon suture and injected  with 0.25% Marcaine with epinephrine. Sterile dressings and a sling applied  and the patient awakened and taken to recovery in stable condition.   FOLLOW UP CARE:  Tamara [name] will be followed as an outpatient on Percocet  for pain with early physical therapy.  See her back in the office in a week  for sutures out follow-up.       RAW/MEDQ  D:  02/18/2005  T:  02/18/2005  Job:  [id number]

## 2011-03-29 NOTE — Op Note (Signed)
NAMEJIL, PENLAND              ACCOUNT NO.:  [id number]   MEDICAL RECORD NO.:  [id number]          PATIENT TYPE:  OUT   LOCATION:  MRI                          FACILITY:  [facility]   PHYSICIAN:  Hosie Spangle, M.D.DATE OF BIRTH:  Apr 30, 1946   DATE OF PROCEDURE:  11/07/2004  DATE OF DISCHARGE:  11/01/2004                                 OPERATIVE REPORT   PREOPERATIVE DIAGNOSIS:  C5-6 and C6-7 cervical disk herniation,  degenerative disk disease, spondylosis and radiculopathy.   POSTOPERATIVE DIAGNOSIS:  C5-6 and C6-7 cervical disk herniation,  degenerative disk disease, spondylosis and radiculopathy.   OPERATION PERFORMED:  C5-6 and C6-7 anterior cervical diskectomy and  arthrodesis with structural allograft and tether cervical plating.   SURGEON:  Hosie Spangle, M.D.   ASSISTANT:  Elizabeth Sauer, M.D.   ANESTHESIA:  General endotracheal.   INDICATIONS FOR PROCEDURE:  The patient is a 64 year old woman who presented  with neck pain and cervical radiculopathy.  She was found to have cervical  disk herniation C6-7 onto C5-6 with underlying degenerative disk disease and  spondylosis.  Decision was made to proceed with two level anterior cervical  diskectomy and arthrodesis.   DESCRIPTION OF PROCEDURE:  The patient was brought to the operating room and  placed under general endotracheal anesthesia.  The patient was placed in 10  pounds of halter traction.  The neck was prepped with Betadine soap and  solution and draped in sterile fashion.  A horizontal incision was made on  the left side of the neck.  The line of the incision was infiltrated with  local anesthetic and epinephrine.  Dissection was carried down through the  subcutaneous tissue and platysma and then dissection was carried out to an  avascular plane, leaving the sternocleidomastoid, carotid artery and jugular  vein laterally and trachea and esophagus medially.  Bipolar cautery was used  to maintain  hemostasis.  An x-ray was taken and we identified the C5-6 and  C6-7 disk spaces.  Diskectomy was begun with incision of the annulus,  continued with microcurettes and pituitary rongeurs.  The operating  microscope was draped and brought into the field to provide additional  magnification, illumination and visualization and the remainder of the  decompression was performed using microdissection and microsurgical  technique.  The cartilaginous end plates of the corresponding vertebrae were  removed using microcurettes along with the ExMax drill.  Posterior  osteophytic overgrowth was removed using the ExMax drill along with 2 mm  Kerrison punch with a thin foot plate.  The posterior longitudinal ligament  was carefully removed along with the spondylitic disk herniation and we were  able to decompress the spinal canal and thecal sac as well as the foramina  and nerve roots bilaterally. Hemostasis was established at each level with  the use of Gelfoam soaked in Thrombin.  We then used bone sizers to select 6  mm graft for the C5-6 level and a 7 mm graft for the C6-7 level.  Each graft  was hydrated in saline solution and placed in the intervertebral disk space  and countersunk.  The traction was discontinued.  We selected a tether  cervical plate and it was positioned over the fusion construct and secured  to C5 and C7 with a pair of 4 x 13 mm screws at each level.  We placed a  single 4 x 13 mm screw at C6.  Each screw hole was drilled and tapped and  the screw placed.  Screws were placed in alternating fashion.  We placed all  five screws and did final tightening of all screws.  The wound was irrigated  with bacitracin solution, checked for hemostasis which was established and  confirmed and then we proceeded with closure.  The platysma was closed with  interrupted inverted 2-0 undyed Vicryl sutures.  Subcutaneous and  subcuticular layer were closed with interrupted inverted 3-0 undyed  Vicryl  sutures and the skin edges were approximated with DermaBond.  The patient  tolerated the procedure well.  The estimated blood loss was 100 mL.  Sponge,  needle and instrument counts were correct.  Following surgery, the patient  was placed in a soft cervical collar to be reversed from anesthetic,  extubated and transferred to the recovery room for further care.      Robe   RWN/MEDQ  D:  11/07/2004  T:  11/07/2004  Job:  [id number]

## 2011-04-10 ENCOUNTER — Other Ambulatory Visit: Payer: Self-pay

## 2011-05-01 IMAGING — US US PELV - US TRANSVAGINAL
1 series · 17 of 25 positions shown · non-contrast
Comparison: none

REASON FOR EXAM: left lower quad pain hx ovarian cyst   pt has had
partial hysterectomy
COMMENTS:

[Series 1: us pelv - us transvaginal · 17 of 34 slices shown]
[im 1/34]
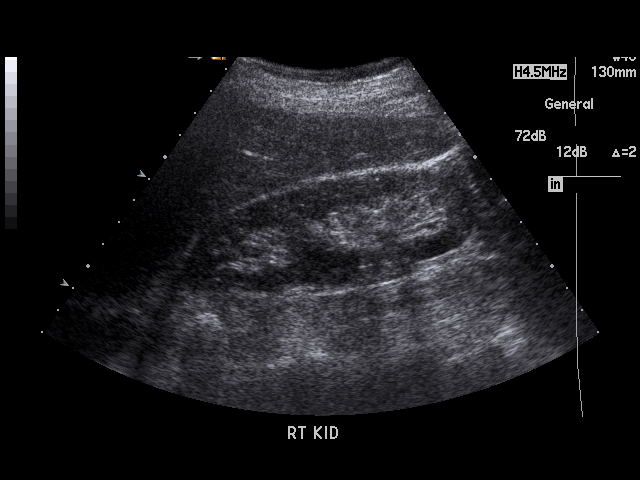
[im 3/34]
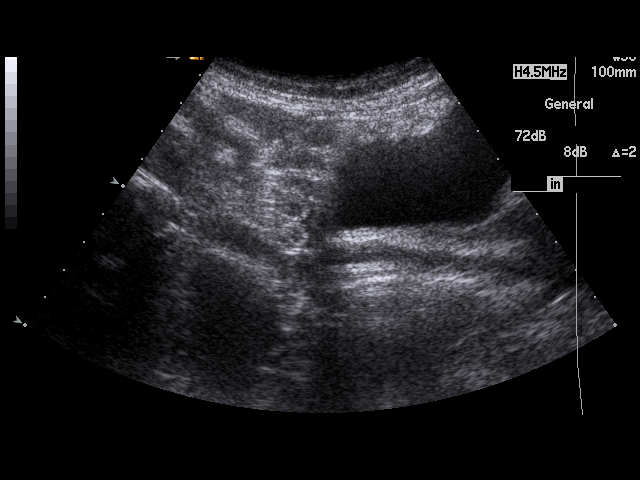
[im 5/34]
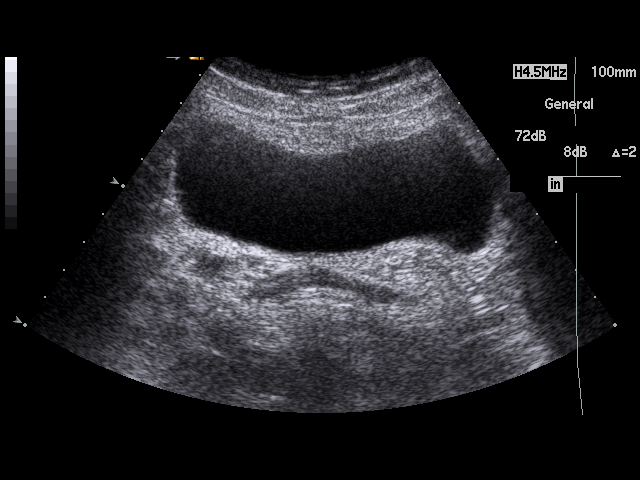
[im 7/34]
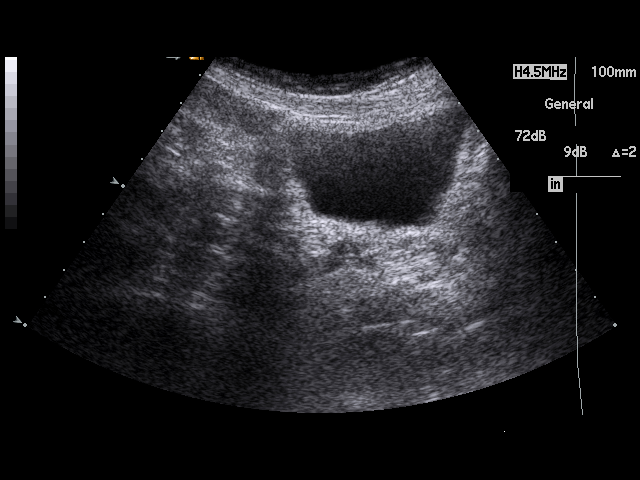
[im 9/34]
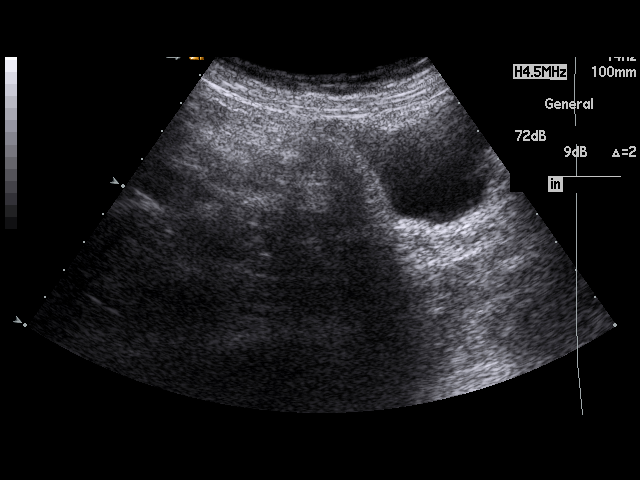
[im 12/34]
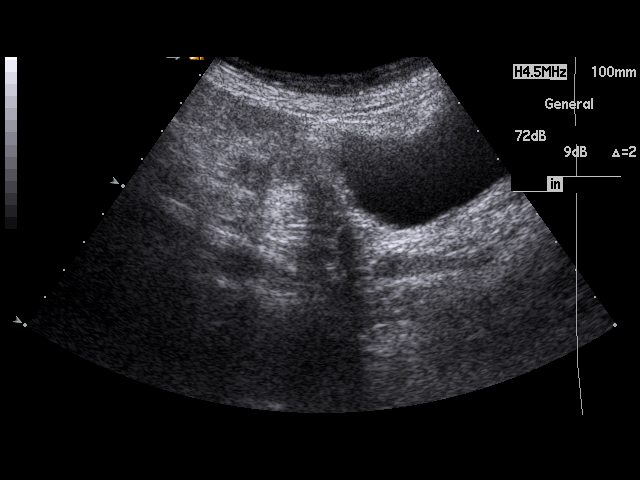
[im 13/34]
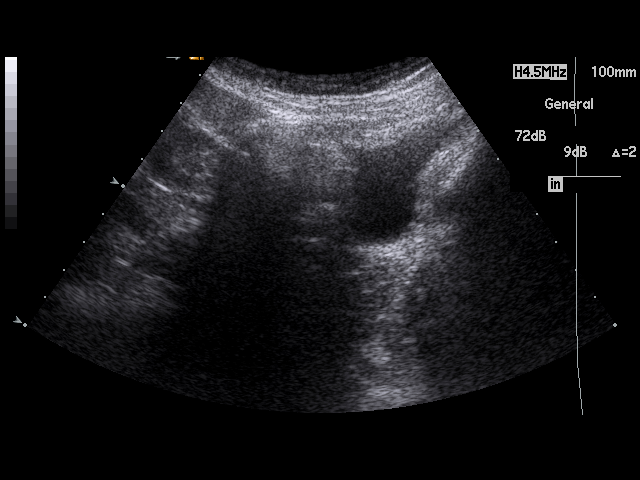
[im 16/34]
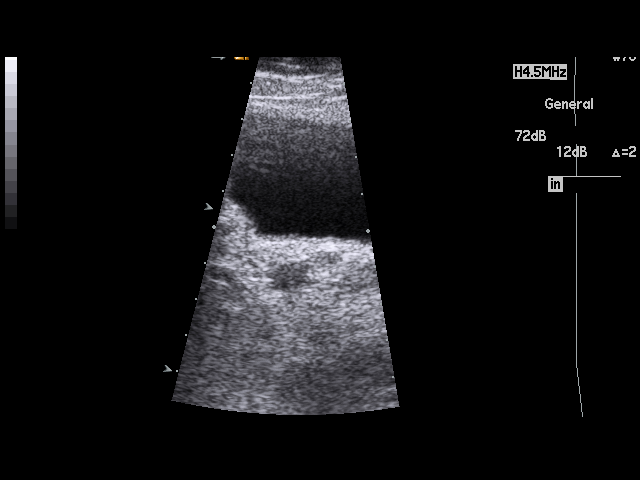
[im 17/34]
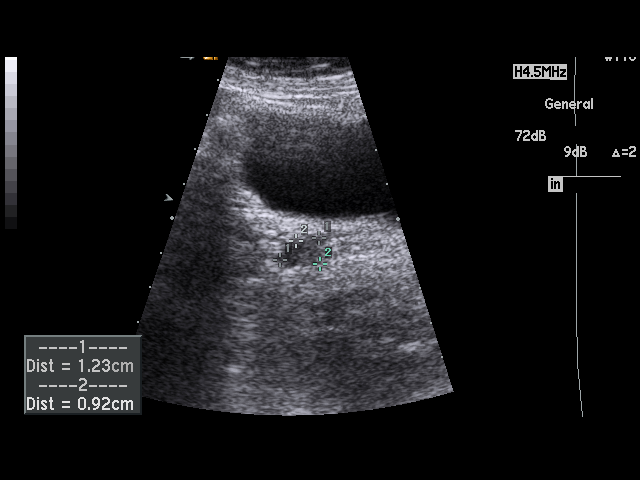
[im 18/34]
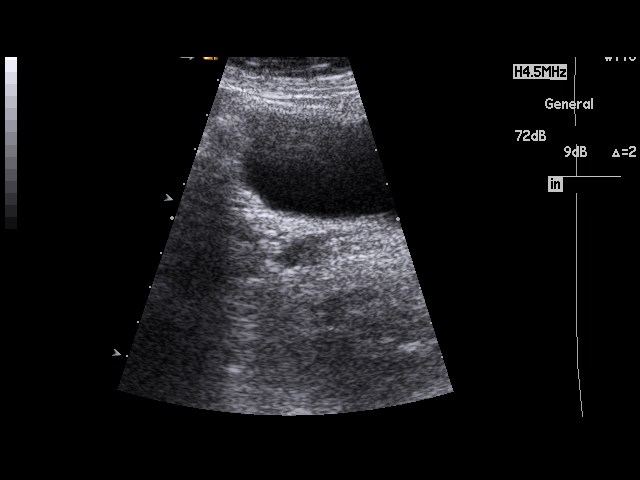
[im 21/34]
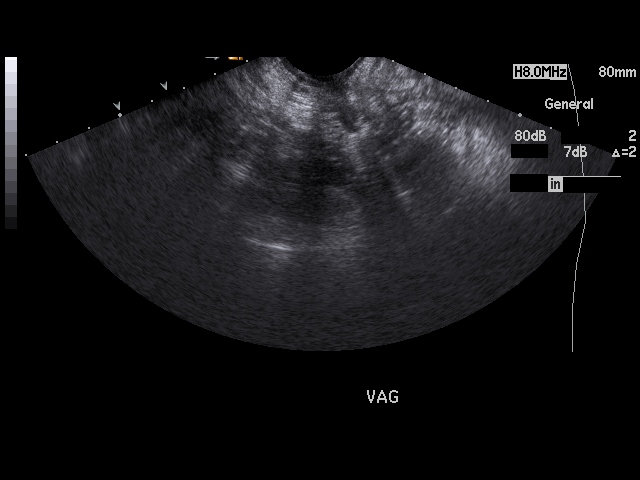
[im 23/34]
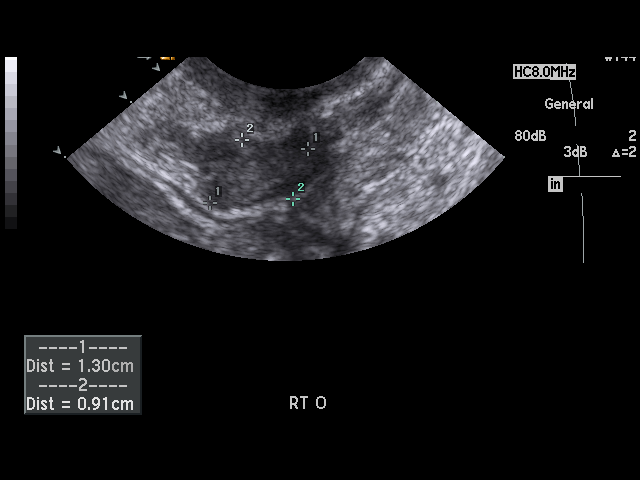
[im 25/34]
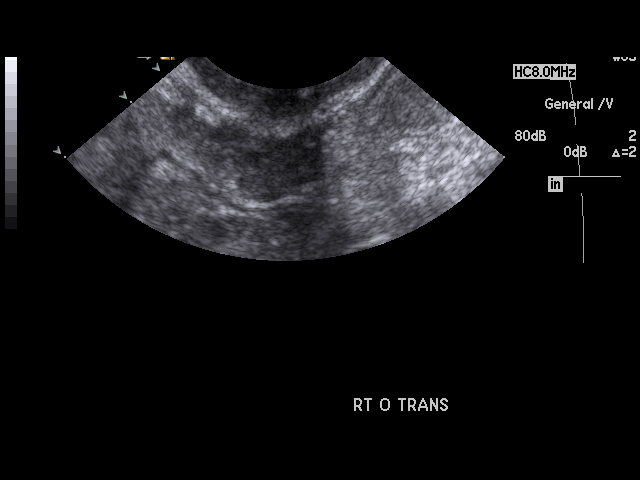
[im 27/34]
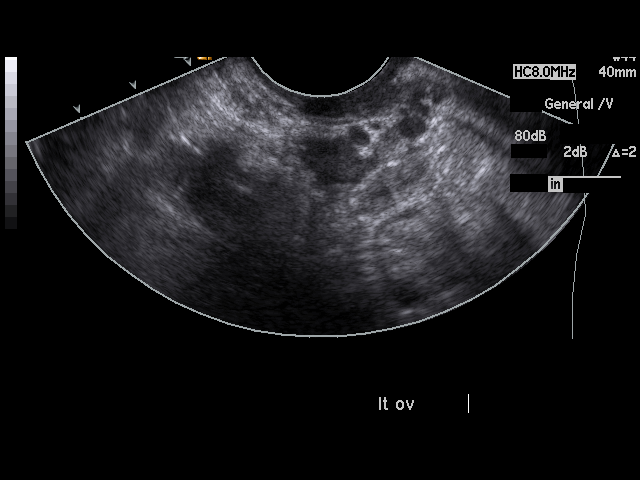
[im 29/34]
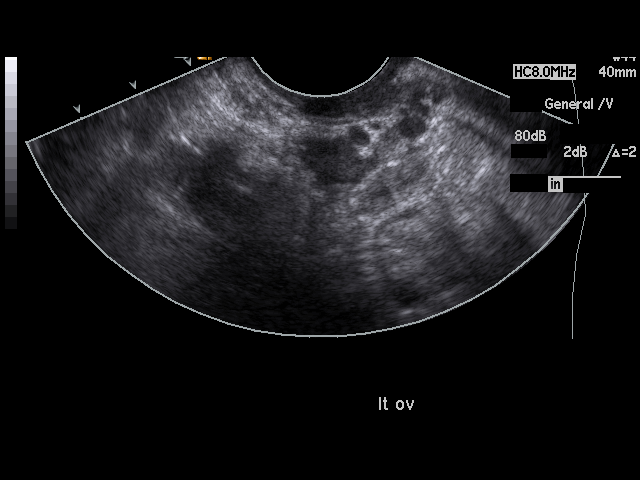
[im 31/34]
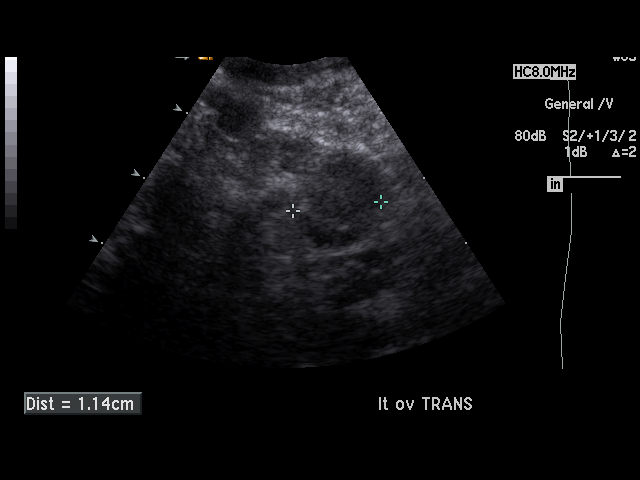
[im 34/34]
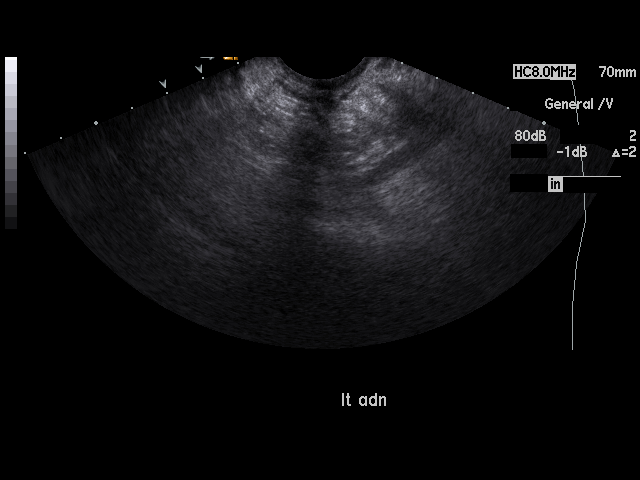

[17 of 25 positions shown; findings below may reference images not displayed]

PROCEDURE:     US  - US PELVIS EXAM W/TRANSVAGINAL  - October 20, 2009  [DATE]

RESULT:     Transabdominal and endovaginal imaging of the pelvis was
obtained.

There is no evidence of pelvic free fluid or loculated fluid collections.
The patient's uterus has been surgically removed. The right ovary measures
1.3 x 0.9 x 1.1 cm and the left 1.1 x 0.9 x 1.1 cm. The ovaries demonstrate
a homogeneous echotexture. No evidence of pelvic masses are identified.
Limited evaluation of the kidneys is unremarkable. The urinary bladder is
partially distended with urine.
IMPRESSION: 1. Unremarkable pelvic ultrasound as described above.

## 2011-07-05 ENCOUNTER — Other Ambulatory Visit: Payer: Self-pay | Admitting: *Deleted

## 2011-07-05 ENCOUNTER — Other Ambulatory Visit: Payer: Self-pay | Admitting: Internal Medicine

## 2011-07-05 MED ORDER — CLONAZEPAM 0.5 MG PO TABS: 0.5000 mg | ORAL_TABLET | Freq: Every day | ORAL | Status: DC

## 2011-07-05 NOTE — Telephone Encounter (Signed)
OK TO REFILL  WITH 3 ADDITIONAL REFILLS

## 2011-08-08 LAB — POCT HEMOGLOBIN-HEMACUE: Hemoglobin: 14.1

## 2011-08-14 ENCOUNTER — Ambulatory Visit: Payer: Self-pay | Admitting: Unknown Physician Specialty

## 2011-08-14 ENCOUNTER — Observation Stay: Payer: Self-pay | Admitting: Unknown Physician Specialty

## 2011-08-14 DIAGNOSIS — I498 Other specified cardiac arrhythmias: Secondary | ICD-10-CM

## 2011-10-07 ENCOUNTER — Other Ambulatory Visit: Payer: Self-pay | Admitting: Internal Medicine

## 2011-10-07 MED ORDER — ACETAZOLAMIDE ER 500 MG PO CP12: 500.0000 mg | ORAL_CAPSULE | Freq: Every day | ORAL | Status: DC

## 2011-10-07 MED ORDER — CLONAZEPAM 0.5 MG PO TABS: 0.5000 mg | ORAL_TABLET | Freq: Every day | ORAL | Status: DC

## 2011-10-07 NOTE — Telephone Encounter (Signed)
Refills on clonazepam #30  2 refills,  acetazolamide #30 3 refills.

## 2011-10-08 MED ORDER — ACETAZOLAMIDE ER 500 MG PO CP12: 500.0000 mg | ORAL_CAPSULE | Freq: Every day | ORAL | Status: DC

## 2011-10-08 MED ORDER — CLONAZEPAM 0.5 MG PO TABS: 0.5000 mg | ORAL_TABLET | Freq: Every day | ORAL | Status: DC

## 2011-11-06 ENCOUNTER — Telehealth: Payer: Self-pay | Admitting: Internal Medicine

## 2011-11-06 NOTE — Telephone Encounter (Signed)
she uses [facility] she needs klonopin, diamox, and antitriplin(not sure of spelling)    By Roney Marion     See above, pt should have RF's looking at 11/26 RF encounter. Will need to call pharm to confirm.

## 2011-11-07 ENCOUNTER — Other Ambulatory Visit: Payer: Self-pay | Admitting: *Deleted

## 2011-11-07 MED ORDER — CLONAZEPAM 0.5 MG PO TABS: 0.5000 mg | ORAL_TABLET | Freq: Every day | ORAL | Status: DC

## 2011-11-07 MED ORDER — ACETAZOLAMIDE ER 500 MG PO CP12: 500.0000 mg | ORAL_CAPSULE | Freq: Every day | ORAL | Status: DC

## 2011-11-07 NOTE — Telephone Encounter (Signed)
Faxed refill requests from [facility], last filled date on both meds was 10/05/11.

## 2011-11-07 NOTE — Telephone Encounter (Signed)
Attempted to call patient. No answer, no machine. Will try again later.  

## 2011-11-07 NOTE — Telephone Encounter (Signed)
Kim, can you help clear this up? THANKS!

## 2011-11-08 NOTE — Telephone Encounter (Signed)
No answer, no machine. Routing back to Judson Roch since I won't be in the office to handle this next week.

## 2011-11-11 NOTE — Telephone Encounter (Signed)
Klonopin and diamox called to [facility] per 12/27 phone note.

## 2011-11-19 ENCOUNTER — Ambulatory Visit (INDEPENDENT_AMBULATORY_CARE_PROVIDER_SITE_OTHER): Payer: PRIVATE HEALTH INSURANCE | Admitting: Internal Medicine

## 2011-11-19 ENCOUNTER — Encounter: Payer: Self-pay | Admitting: Internal Medicine

## 2011-11-19 VITALS — BP 116/60 | HR 84 | Temp 97.9°F | Wt 103.0 lb

## 2011-11-19 DIAGNOSIS — Z8719 Personal history of other diseases of the digestive system: Secondary | ICD-10-CM | POA: Insufficient documentation

## 2011-11-19 DIAGNOSIS — K5090 Crohn's disease, unspecified, without complications: Secondary | ICD-10-CM

## 2011-11-19 DIAGNOSIS — K50912 Crohn's disease, unspecified, with intestinal obstruction: Secondary | ICD-10-CM | POA: Insufficient documentation

## 2011-11-19 DIAGNOSIS — M810 Age-related osteoporosis without current pathological fracture: Secondary | ICD-10-CM

## 2011-11-19 DIAGNOSIS — G8929 Other chronic pain: Secondary | ICD-10-CM | POA: Insufficient documentation

## 2011-11-19 DIAGNOSIS — J069 Acute upper respiratory infection, unspecified: Secondary | ICD-10-CM | POA: Insufficient documentation

## 2011-11-19 DIAGNOSIS — M818 Other osteoporosis without current pathological fracture: Secondary | ICD-10-CM | POA: Insufficient documentation

## 2011-11-19 MED ORDER — AMOXICILLIN-POT CLAVULANATE 875-125 MG PO TABS: 1.0000 | ORAL_TABLET | Freq: Two times a day (BID) | ORAL | Status: AC

## 2011-11-19 MED ORDER — SERTRALINE HCL 100 MG PO TABS: 100.0000 mg | ORAL_TABLET | Freq: Every day | ORAL | Status: DC

## 2011-11-19 MED ORDER — CLONAZEPAM 0.5 MG PO TABS: 0.5000 mg | ORAL_TABLET | Freq: Every day | ORAL | Status: DC

## 2011-11-19 MED ORDER — ACETAZOLAMIDE ER 500 MG PO CP12: 500.0000 mg | ORAL_CAPSULE | Freq: Every day | ORAL | Status: DC

## 2011-11-19 NOTE — Assessment & Plan Note (Signed)
Symptoms are currently viral and she is not wheezing.  but given her long tobacco history she is at increased risk for bacterial infection.  Will give her an rx for abx to start if she develops fever, purulent sputum or facial pain .

## 2011-11-19 NOTE — Patient Instructions (Signed)
Do not start the augmentin unless you develop fevers (t > 100.4)..  Facial or ear pain or green/brown discharge

## 2011-11-19 NOTE — Progress Notes (Signed)
Subjective:    Patient ID: Tamara [name], female    DOB: [birth year], 66 y.o.   MRN: [id number]  HPI  Tamara [name] is a 66 yr old married white female with a history of Crohn's disease and chronic abdominal pain who was admitted to [facility] in October by Dr. Vira Agar for a SBO, who left  [facility] while still symptomatic to take care of mother Eric Form, who has been under the care of Hospice for nearly one year.  Her vomiting has resolved, but she continues to have anorexia and is eating only one meal per day.  She feels bloated but is not losing weight. Her CC is a mild productive cough,  accompanied by a swollen LN on the right  side of her neck, as well as mild diffuse arthralgias.  She received the influenza vaccine last month.      Review of Systems  Constitutional: Negative for fever, chills and unexpected weight change.  HENT: Negative for hearing loss, ear pain, nosebleeds, congestion, sore throat, facial swelling, rhinorrhea, sneezing, mouth sores, trouble swallowing, neck pain, neck stiffness, voice change, postnasal drip, sinus pressure, tinnitus and ear discharge.   Eyes: Negative for pain, discharge, redness and visual disturbance.  Respiratory: Negative for cough, chest tightness, shortness of breath, wheezing and stridor.   Cardiovascular: Negative for chest pain, palpitations and leg swelling.  Musculoskeletal: Negative for myalgias and arthralgias.  Skin: Negative for color change and rash.  Neurological: Negative for dizziness, weakness, light-headedness and headaches.  Hematological: Negative for adenopathy.       Objective:   Physical Exam  Constitutional: She is oriented to person, place, and time. She appears well-developed and well-nourished.  HENT:  Mouth/Throat: Oropharynx is clear and moist.  Eyes: EOM are normal. Pupils are equal, round, and reactive to light. No scleral icterus.  Neck: Normal range of motion. Neck supple. No JVD present. No thyromegaly present.    Cardiovascular: Normal rate, regular rhythm, normal heart sounds and intact distal pulses.   Pulmonary/Chest: Effort normal and breath sounds normal.  Abdominal: Soft. Bowel sounds are normal. She exhibits no mass. There is no tenderness.  Musculoskeletal: Normal range of motion. She exhibits no edema.  Lymphadenopathy:    She has no cervical adenopathy.  Neurological: She is alert and oriented to person, place, and time.  Skin: Skin is warm and dry.  Psychiatric: She has a normal mood and affect.          Assessment & Plan:  Anxiety:  Secondary to mother's impending death.  Spent 10 minutes counselling patient.  Refill clonazepam.  Upper respiratory tract infection Symptoms are currently viral and she is not wheezing.  but given her long tobacco history she is at increased risk for bacterial infection.  Will give her an rx for abx to start if she develops fever, purulent sputum or facial pain .   Osteoporosis, unspecified Secondary to Crohn's disease and use of steroids.  Continue Reclast by Dr . Jefm Bryant  Crohn's disease Managed by Dr. Vira Agar with mesalamine     Updated Medication List Outpatient Encounter Prescriptions as of 11/19/2011  Medication Sig Dispense Refill  . acetaZOLAMIDE (DIAMOX) 500 MG capsule Take 1 capsule (500 mg total) by mouth daily.  30 capsule  5  . amitriptyline (ELAVIL) 50 MG tablet Take 50 mg by mouth at bedtime.        . budesonide (ENTOCORT EC) 3 MG 24 hr capsule Take one by mouth three times daily       .  clonazePAM (KLONOPIN) 0.5 MG tablet Take 1 tablet (0.5 mg total) by mouth daily.  30 tablet  3  . HYDROcodone-acetaminophen (VICODIN) 5-500 MG per tablet Take one by mouth as needed       . mesalamine (PENTASA) 250 MG CR capsule Take 250 mg by mouth 3 (three) times daily.        Marland Kitchen omeprazole (PRILOSEC) 20 MG capsule Take 2 by mouth daily       . promethazine (PHENERGAN) 25 MG tablet Take one by mouth as needed       . sertraline (ZOLOFT) 100 MG  tablet Take 1 tablet (100 mg total) by mouth daily.  30 tablet  6  . zoledronic acid (RECLAST) 5 MG/100ML SOLN Inject 5 mg into the vein once. yearly       . DISCONTD: acetaZOLAMIDE (DIAMOX) 500 MG capsule Take 1 capsule (500 mg total) by mouth daily.  30 capsule  5  . DISCONTD: clonazePAM (KLONOPIN) 0.5 MG tablet Take 1 tablet (0.5 mg total) by mouth daily.  30 tablet  3  . DISCONTD: sertraline (ZOLOFT) 100 MG tablet Take 100 mg by mouth daily.        Marland Kitchen amoxicillin-clavulanate (AUGMENTIN) 875-125 MG per tablet Take 1 tablet by mouth 2 (two) times daily.  14 tablet  0

## 2011-11-19 NOTE — Assessment & Plan Note (Signed)
Managed by Dr. Vira Agar with mesalamine

## 2011-11-19 NOTE — Assessment & Plan Note (Signed)
Secondary to Crohn's disease and use of steroids.  Continue Reclast by Dr . Jefm Bryant

## 2011-11-25 IMAGING — MR MRI HEAD WITHOUT AND WITH CONTRAST
5 of 9 series · 27 of 48 positions shown · IV contrast (8 ML MAGNEVIST)
Comparison: none

REASON FOR EXAM: new onset daily HA with vision changes weight loss x 4
weeks
COMMENTS:

[Series 7: FLAIR · axial · 5.0mm · 0.45mm/px · z∈[-51,+128]mm · 5 of 24 slices shown]
[im 1/24]
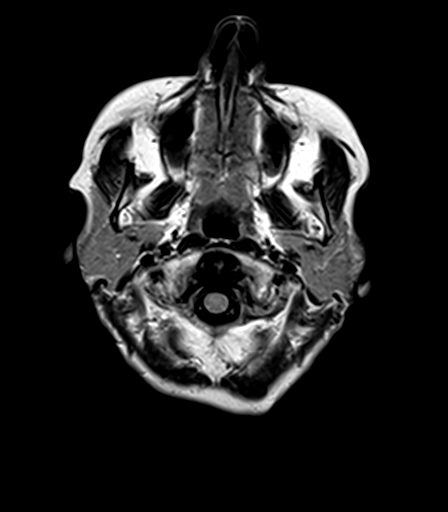
[im 6/24]
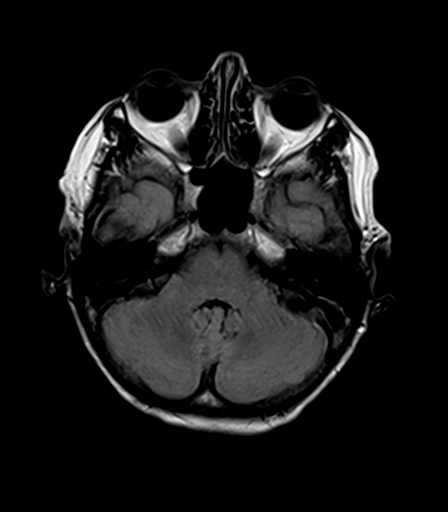
[im 12/24]
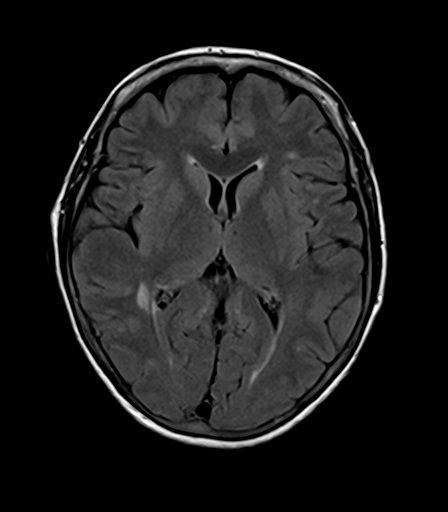
[im 18/24]
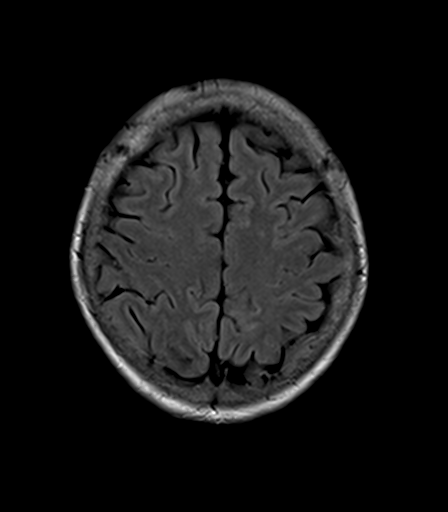
[im 24/24]
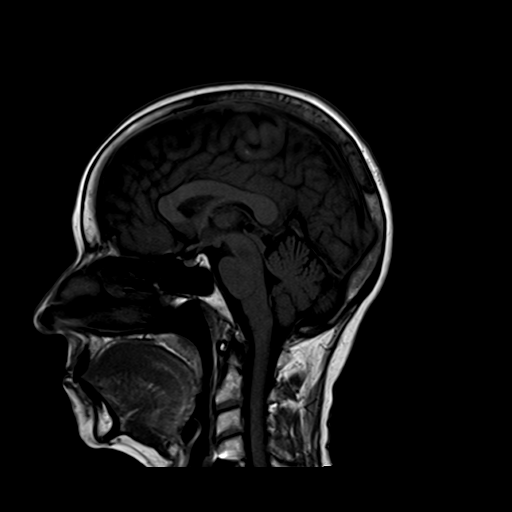

[Series 9: T1 post-contrast · coronal · 4.0mm · 0.45mm/px · 6 of 32 slices shown (1 of 2)]
[im 1/32]
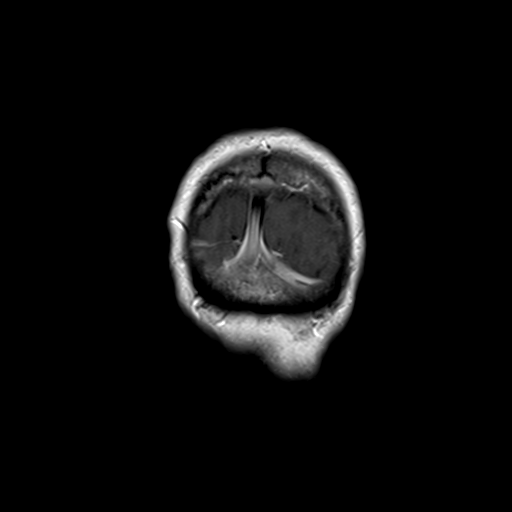
[im 7/32]
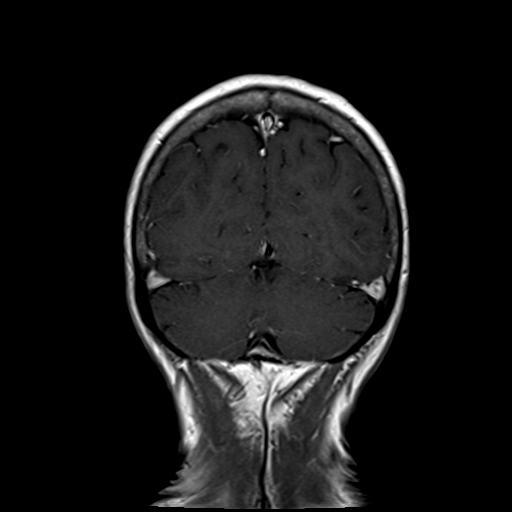
[im 13/32]
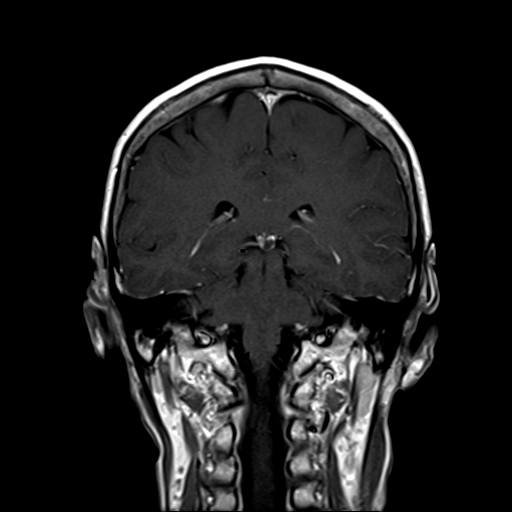
[im 19/32]
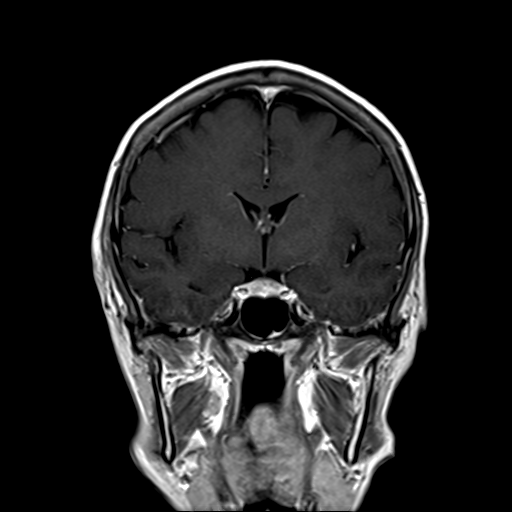
[im 25/32]
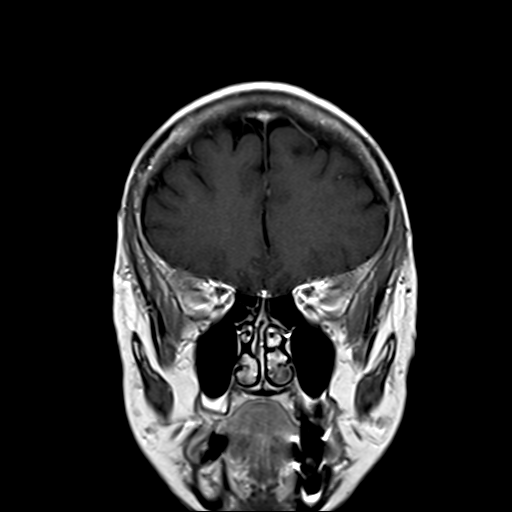
[im 32/32]
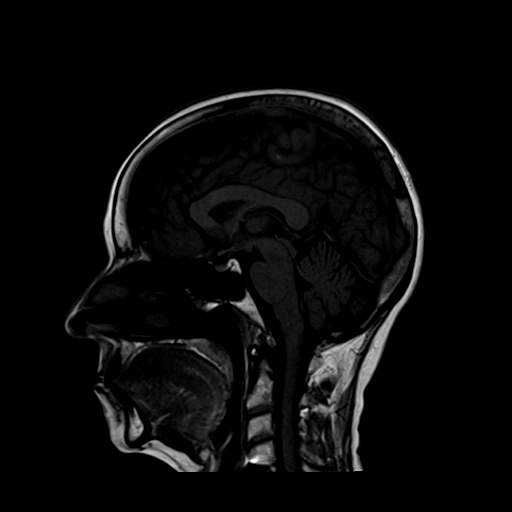

[Series 10: T1 post-contrast · axial · 4.0mm · 0.45mm/px · z∈[-55,+130]mm · 6 of 32 slices shown (2 of 2)]
[im 1/32]
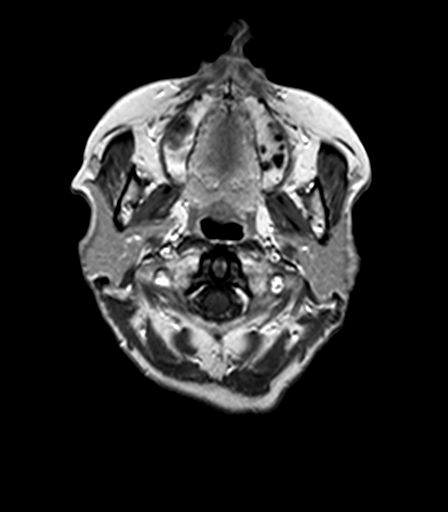
[im 7/32]
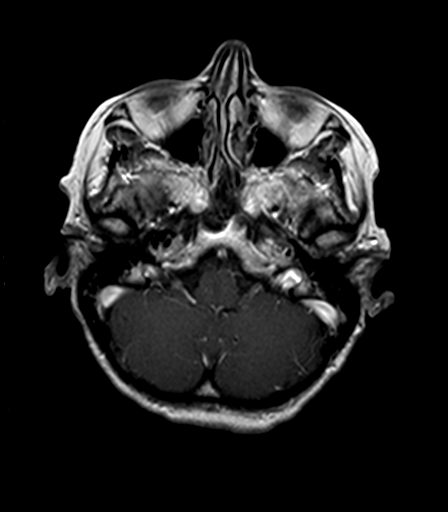
[im 13/32]
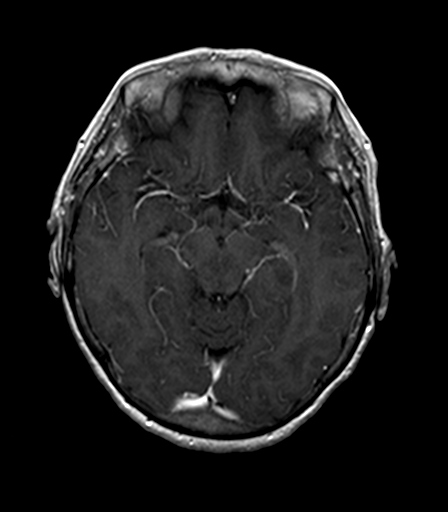
[im 19/32]
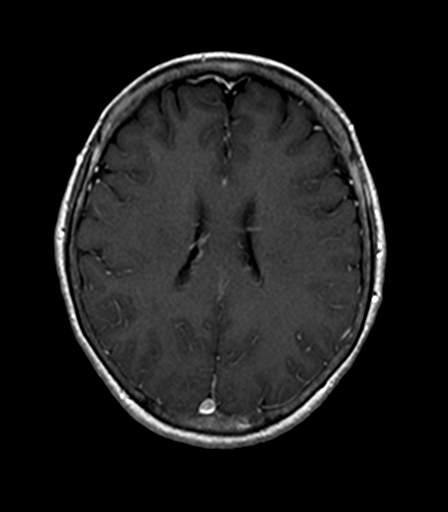
[im 25/32]
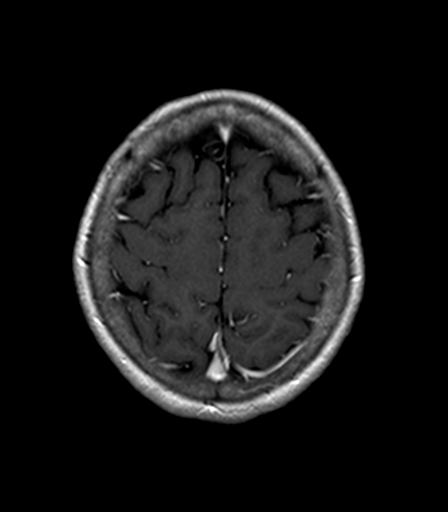
[im 32/32]
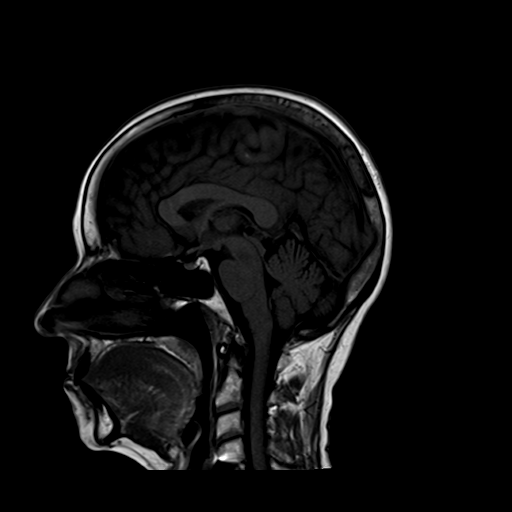

[Series 5002: ADC · axial · 5.0mm · 1.20mm/px · z∈[-51,+158]mm · 5 of 24 slices shown]
[im 1/24]
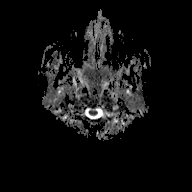
[im 6/24]
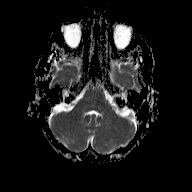
[im 12/24]
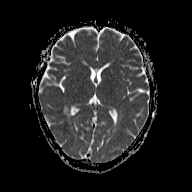
[im 18/24]
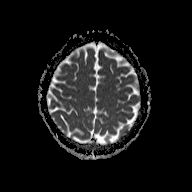
[im 24/24]
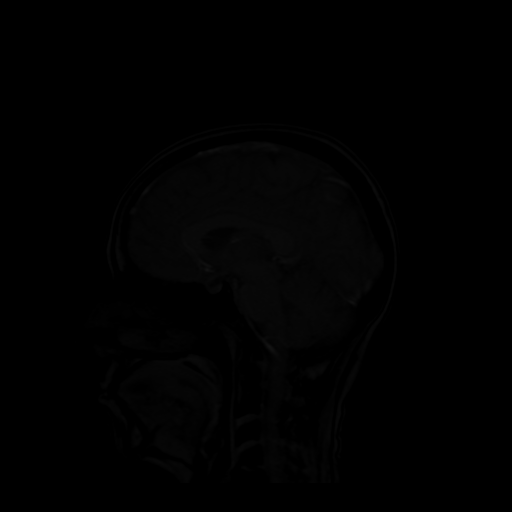

[Series 5003: DWI · axial · 5.0mm · 1.20mm/px · z∈[-51,+158]mm · 5 of 24 slices shown]
[im 1/24]
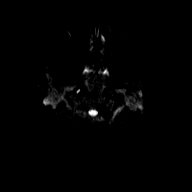
[im 6/24]
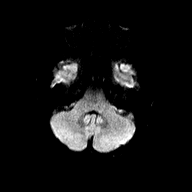
[im 12/24]
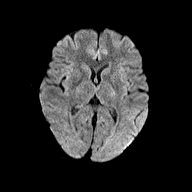
[im 18/24]
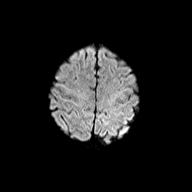
[im 24/24]
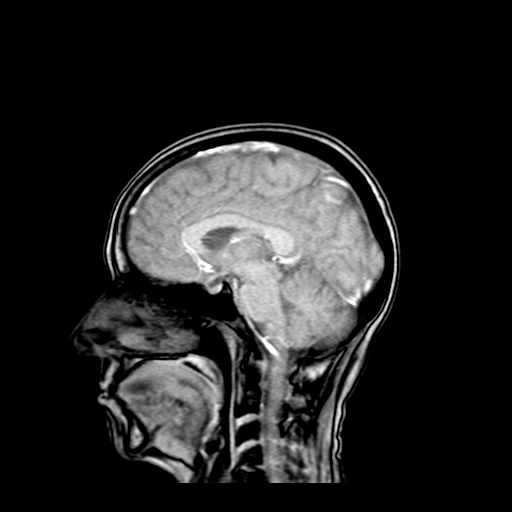

[27 of 48 positions shown; findings below may reference images not displayed]

PROCEDURE:     MMR - MMR BRAIN WO/W CONTRAST  - May 16, 2010  [DATE]

RESULT:     Multiplanar, multisequence imaging of the brain was obtained
pre- and postintravenous administration of 8 mL IV Magnevist.

Evaluation of diffusion-weighted imaging demonstrates no signal
abnormalities.

There is no evidence of abnormal parenchymal enhancement or enhancing masses
or nodules. There is no evidence of intra-axial or extra-axial fluid
collections. Mild areas of increased T2 signal project within the
subcortical and deep white matter regions. These areas demonstrate minimal
concomitant decreased T1 signal. The sella and parasellar regions and
structures as well as cerebellopontine angle regions and visualized portions
of the seventh and eighth cranial nerves demonstrate no signal or
enhancement abnormalities. The cerebellum, pons and midbrain demonstrate no
signal or enhancement abnormalities.  The ventricles and cisterns are
patent. Vascular flow voids unremarkable.
IMPRESSION: 1. Findings likely representing the sequela of mild small vessel white
matter ischemic disease considering the patient's age. Otherwise, no
evidence of focal or acute abnormalities.
2. Note, the white matter changes if clinically appropriate may represent
the sequela of a mild demyelinating disorder such as MS if the patient has
appropriate clinical history.

## 2011-12-09 ENCOUNTER — Encounter: Payer: Self-pay | Admitting: Internal Medicine

## 2012-03-06 ENCOUNTER — Other Ambulatory Visit: Payer: Self-pay | Admitting: Internal Medicine

## 2012-03-06 MED ORDER — CLONAZEPAM 0.5 MG PO TABS: 0.5000 mg | ORAL_TABLET | Freq: Every day | ORAL | Status: DC

## 2012-04-29 ENCOUNTER — Ambulatory Visit: Payer: Self-pay | Admitting: Unknown Physician Specialty

## 2012-05-05 ENCOUNTER — Telehealth: Payer: Self-pay | Admitting: Internal Medicine

## 2012-05-05 NOTE — Telephone Encounter (Signed)
Patient called in and needs a prescription for amitriptyline 50 mg called into [facility] in [place].  I asked patient if she had called her pharmacy and they advised patient that she needed to call here.

## 2012-05-06 MED ORDER — AMITRIPTYLINE HCL 50 MG PO TABS: 50.0000 mg | ORAL_TABLET | Freq: Every day | ORAL | Status: DC

## 2012-06-04 ENCOUNTER — Other Ambulatory Visit: Payer: Self-pay | Admitting: *Deleted

## 2012-06-19 ENCOUNTER — Encounter: Payer: Self-pay | Admitting: Internal Medicine

## 2012-07-06 ENCOUNTER — Other Ambulatory Visit: Payer: Self-pay | Admitting: Internal Medicine

## 2012-07-07 ENCOUNTER — Telehealth: Payer: Self-pay | Admitting: Internal Medicine

## 2012-07-07 MED ORDER — CLONAZEPAM 0.5 MG PO TABS: 0.5000 mg | ORAL_TABLET | Freq: Every day | ORAL | Status: DC

## 2012-07-07 MED ORDER — ACETAZOLAMIDE ER 500 MG PO CP12: 500.0000 mg | ORAL_CAPSULE | Freq: Every day | ORAL | Status: DC

## 2012-07-07 NOTE — Telephone Encounter (Signed)
Pt called stated she called yesterday needing a refill on diamox Pt stated she called  [facility] this morning and they do not have rx Pt has enough for this evening

## 2012-07-07 NOTE — Telephone Encounter (Signed)
Waiting for Dr. Lupita Dawn approval.

## 2012-07-12 IMAGING — CT CT ABD-PELV W/O CM
1 of 2 series · 15 of 32 positions shown, 19 images · non-contrast
Comparison: none

REASON FOR EXAM: STAT CR [id number] ORAL CONTRAST ONLY  abd pain
Crohns Disease
COMMENTS:

PROCEDURE:     CT  - CT ABDOMEN AND PELVIS W[DATE] [DATE]
RESULT:     CT abdomen pelvis dated 01/01/2011 comparison made to prior study
dated 02/01/2009.
TECHNIQUE: Helical 5 mm sections were obtained from the lung bases through
the pubic symphysis status post administration of oral contrast.

[Series 2: soft tissue · axial · 0.64mm/px · z∈[-1030,-630]mm · 15 of 88 slices shown, 19 images]
[im 4/88  soft-tissue]
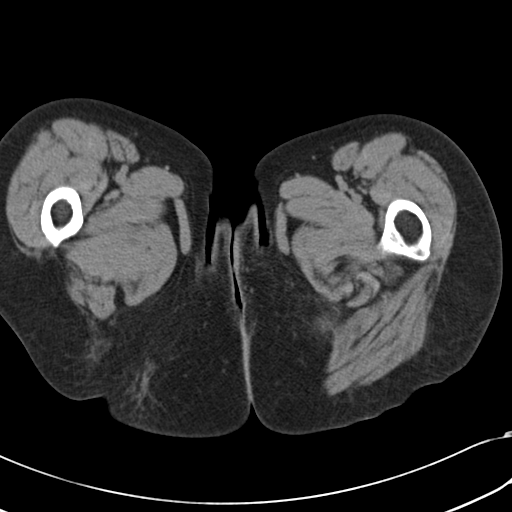
[im 4/88  bone]
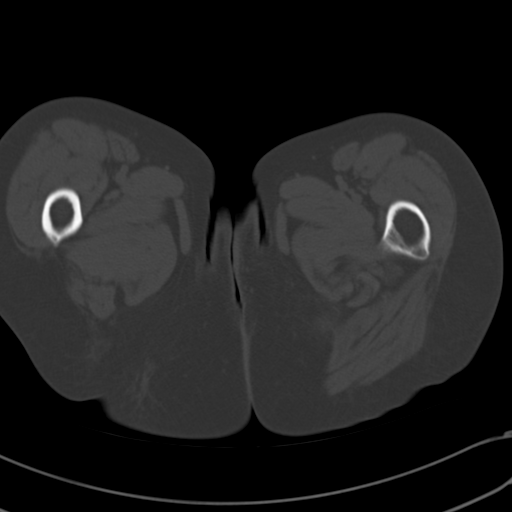
[im 11/88  soft-tissue]
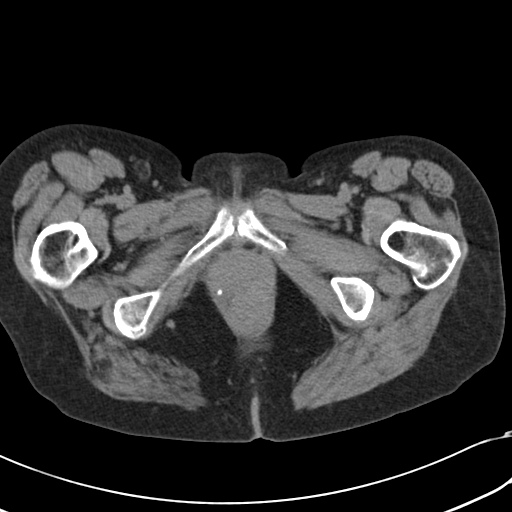
[im 19/88  soft-tissue]
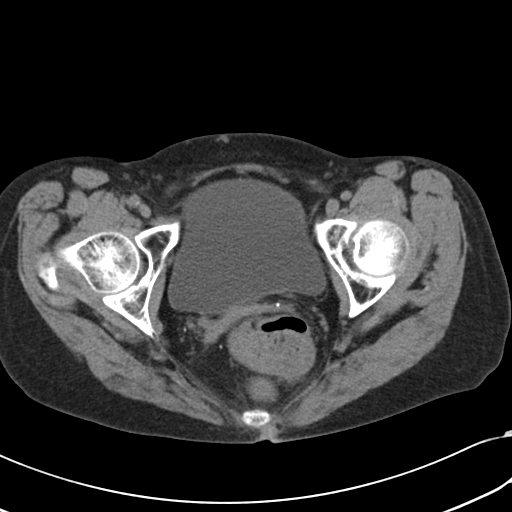
[im 26/88  soft-tissue]
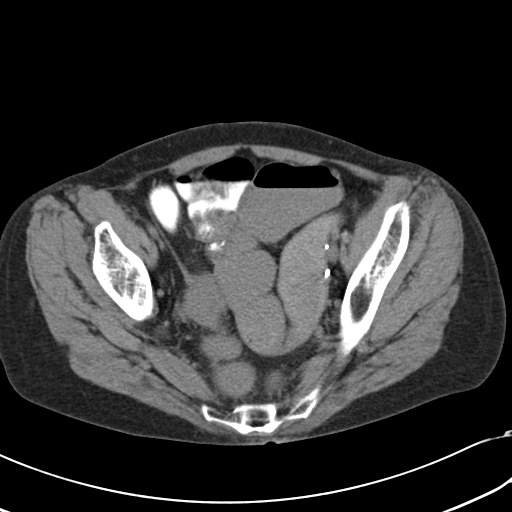
[im 30/88  soft-tissue]
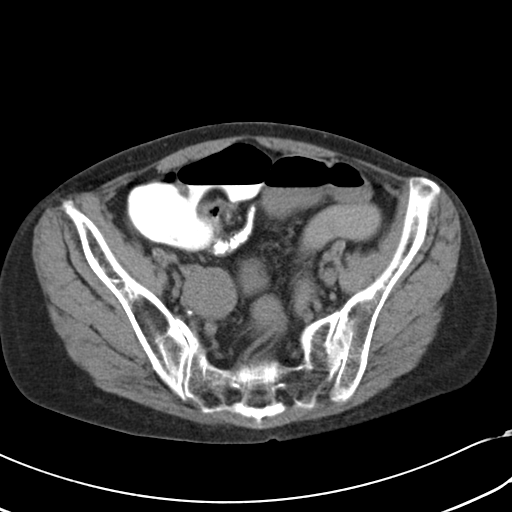
[im 37/88  soft-tissue]
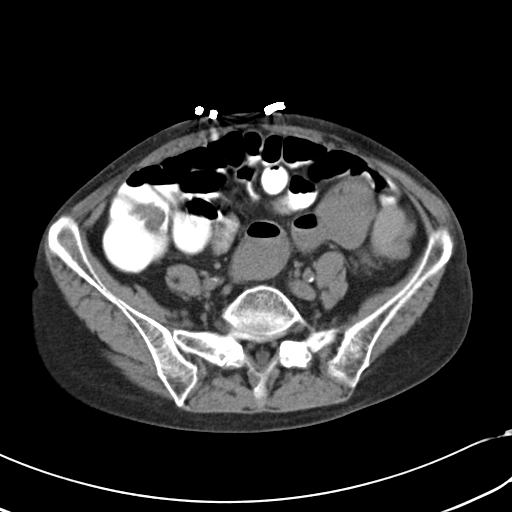
[im 44/88  soft-tissue]
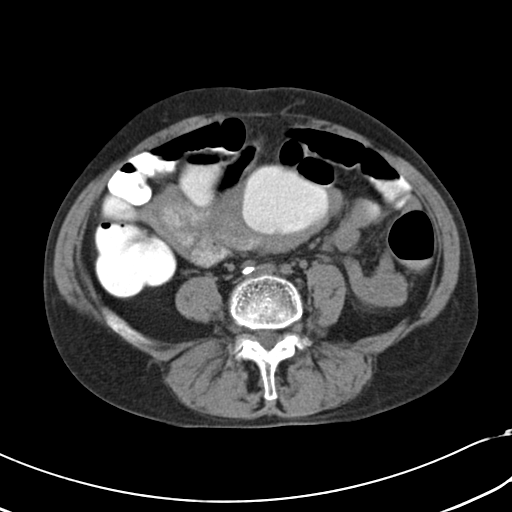
[im 51/88  soft-tissue]
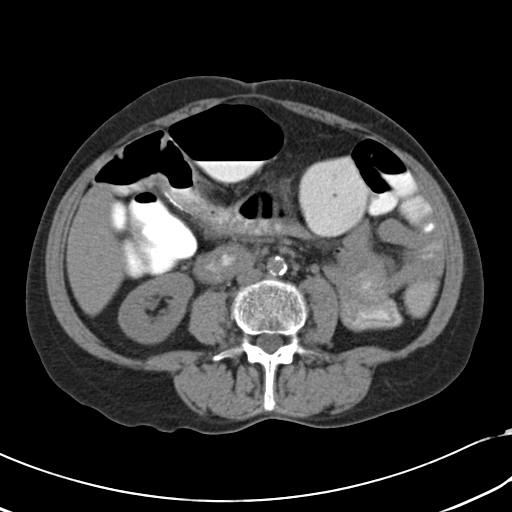
[im 59/88  soft-tissue]
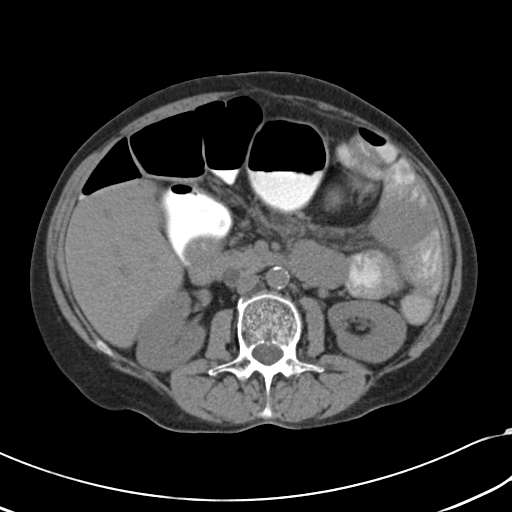
[im 59/88  bone]
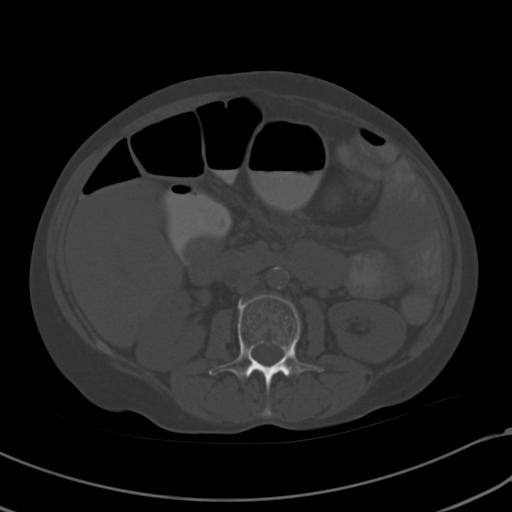
[im 62/88  soft-tissue]
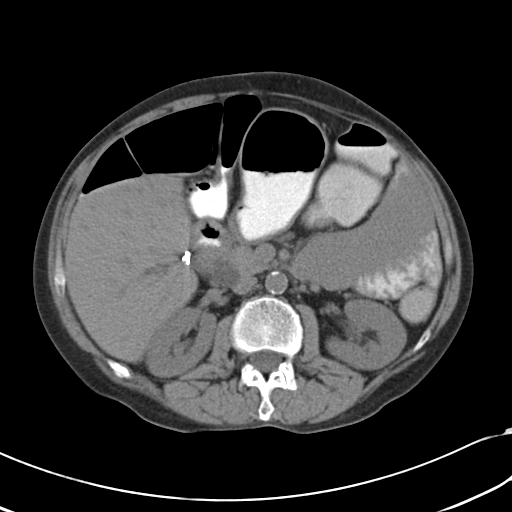
[im 69/88  soft-tissue]
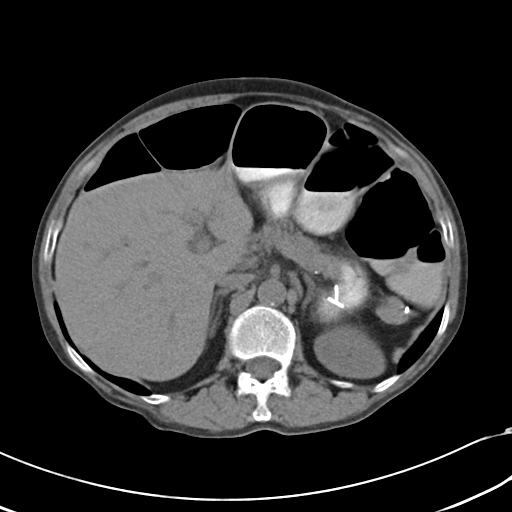
[im 73/88  lung]
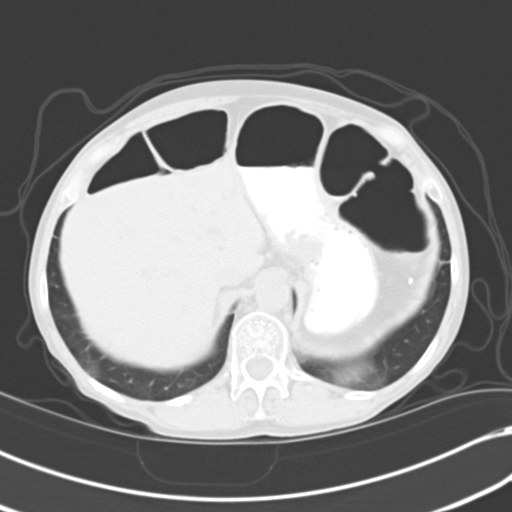
[im 77/88  soft-tissue]
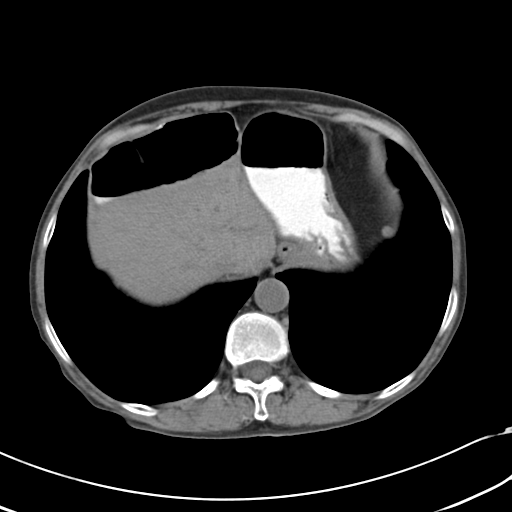
[im 77/88  lung]
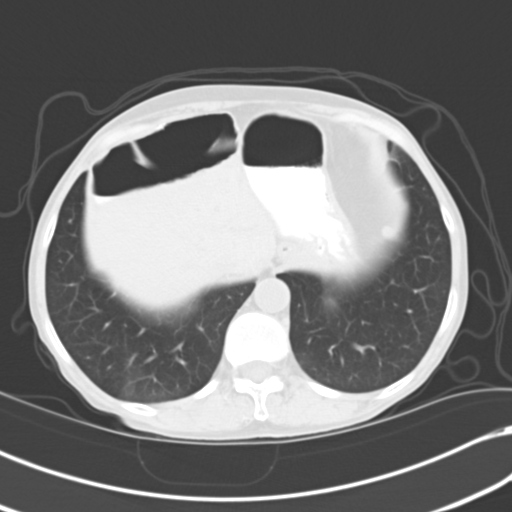
[im 80/88  lung]
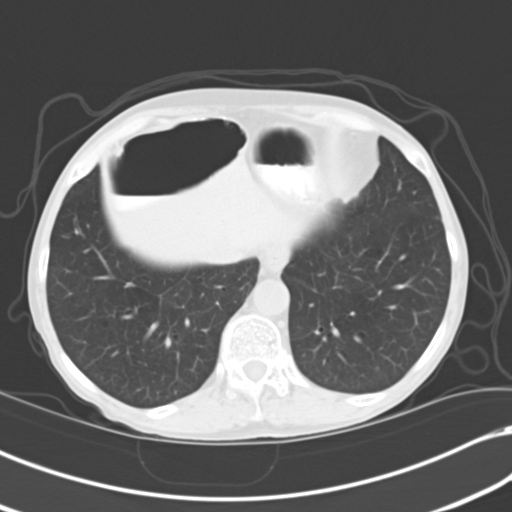
[im 84/88  soft-tissue]
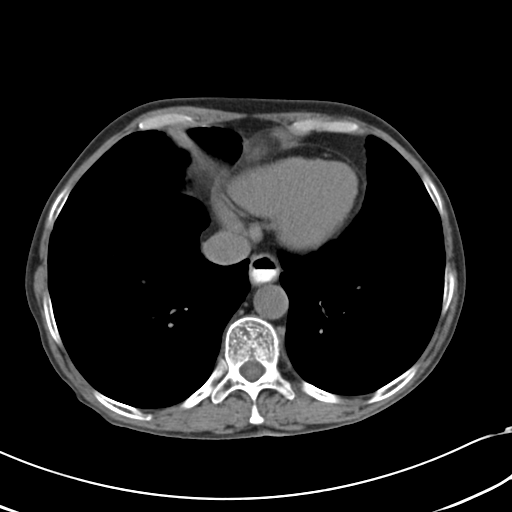
[im 84/88  lung]
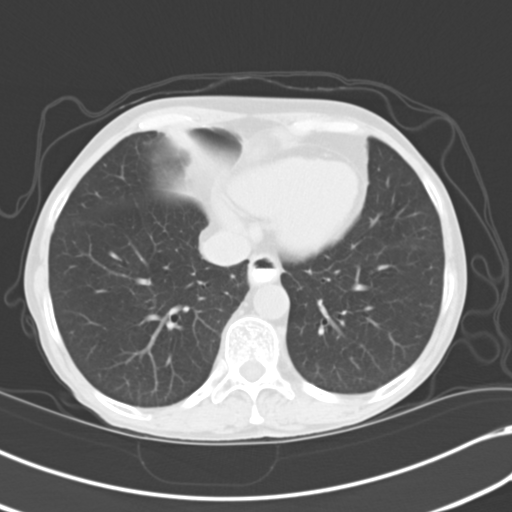

[15 of 32 positions shown; findings below may reference images not displayed]

FINDINGS: The demonstrates a vague nodular density along the medial aspect
of the base of the lingula differential consideration atelectasis versus
infiltrate, a small nodule cannot be excluded surveillance evaluation
recommended. The remaining lung parenchyma is unremarkable.

The adrenals, pancreas,and kidneys are unremarkable. Postsurgical changes
identified within the abdomen.

Multiple distended of small bowel are scattered throughout the abdomen.
Evaluation of the terminal ileum demonstrates bowel wall thickening with
mild irregularity of the most distal portion of the terminal ileum.
Component of the narrowing may be secondary to partial decompression. There
is no evidence of drainable loculated fluid collections nor significant
inflammatory change within the mesenteric fat. The aorta demonstrates no
evidence of abdominal aortic aneurysm. Air,fluid and contrst-filled loops of
colon are appreciated within the abdomen. There is no evidence of abdominal
free fluid, loculated fluid collections, or intraperitoneal air.
IMPRESSION: 1. Findings suspicious for early or mild active inflammatory changes/
Crohn's disease involving the distal ileum extending to the terminal ileum
with associated ileus versus a partial small bowel obstruction. Findings
best appreciated on images 59-62. There is no evidence of drainable
loculated fluid collections nor free fluid.

## 2012-09-04 ENCOUNTER — Other Ambulatory Visit: Payer: Self-pay | Admitting: Internal Medicine

## 2012-09-04 NOTE — Telephone Encounter (Signed)
Refill request for acetazolamide er 500 mg cer #30 Sig: take one capsule by mouth daily

## 2012-09-07 MED ORDER — ACETAZOLAMIDE ER 500 MG PO CP12: 500.0000 mg | ORAL_CAPSULE | Freq: Every day | ORAL | Status: DC

## 2012-10-12 ENCOUNTER — Ambulatory Visit: Payer: Self-pay | Admitting: Unknown Physician Specialty

## 2012-11-03 ENCOUNTER — Other Ambulatory Visit: Payer: Self-pay | Admitting: General Practice

## 2012-11-03 NOTE — Telephone Encounter (Signed)
Received this refill request. Pt has not been seen since 11/29/11. Please advise.

## 2012-11-04 MED ORDER — AMITRIPTYLINE HCL 50 MG PO TABS: 50.0000 mg | ORAL_TABLET | Freq: Every day | ORAL | Status: DC

## 2012-11-04 MED ORDER — CLONAZEPAM 0.5 MG PO TABS: 0.5000 mg | ORAL_TABLET | Freq: Every day | ORAL | Status: DC

## 2012-11-04 MED ORDER — ACETAZOLAMIDE ER 500 MG PO CP12: 500.0000 mg | ORAL_CAPSULE | Freq: Every day | ORAL | Status: DC

## 2012-11-04 NOTE — Telephone Encounter (Signed)
Yes ok to refill all 3 includingf the clonazepam ,  I have known this patient for years ,  Her mother recently passed away after a long Hospice requiring illness . It would be a good idea to get her to make an appt though prior to future refills

## 2012-11-04 NOTE — Addendum Note (Signed)
Addended by: Crecencio Mc on: 11/04/2012 09:21 AM   Modules accepted: Orders

## 2012-11-05 NOTE — Telephone Encounter (Signed)
Filled this morning.

## 2013-01-21 ENCOUNTER — Ambulatory Visit: Payer: Self-pay | Admitting: Unknown Physician Specialty

## 2013-02-22 IMAGING — CT CT ABD-PELV W/O CM
1 of 2 series · 15 of 32 positions shown, 19 images · non-contrast
Comparison: none

REASON FOR EXAM: (1) SBO; Crohn's;  Dr Nice order for patient to be
given   RED ORAL CONTRAST;
COMMENTS:

[Series 2: 3mm soft tissue · axial · 0.63mm/px · z∈[-1116,-718]mm · 15 of 145 slices shown, 19 images]
[im 6/145  soft-tissue]
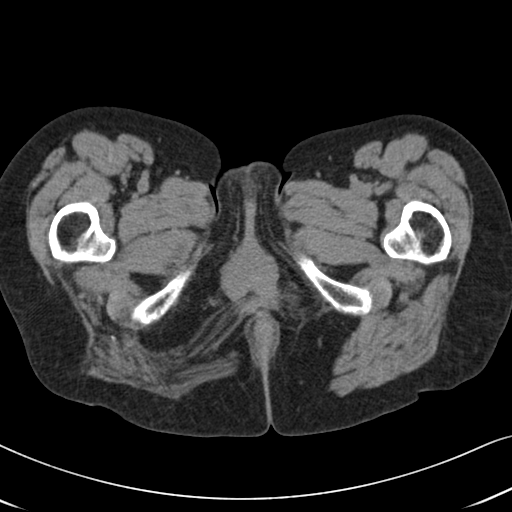
[im 6/145  bone]
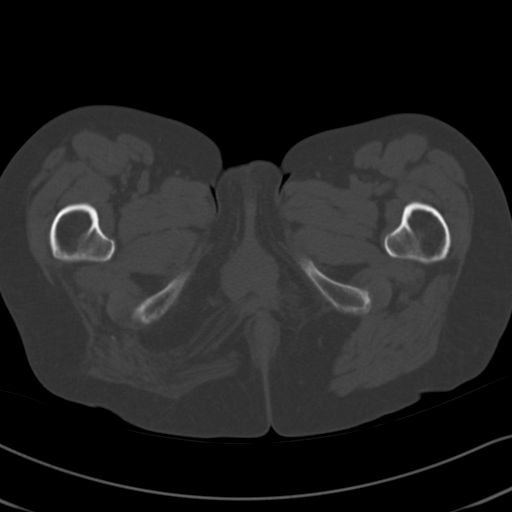
[im 18/145  soft-tissue]
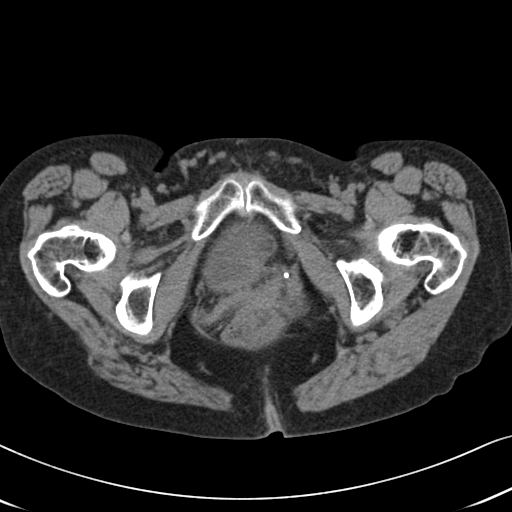
[im 29/145  soft-tissue]
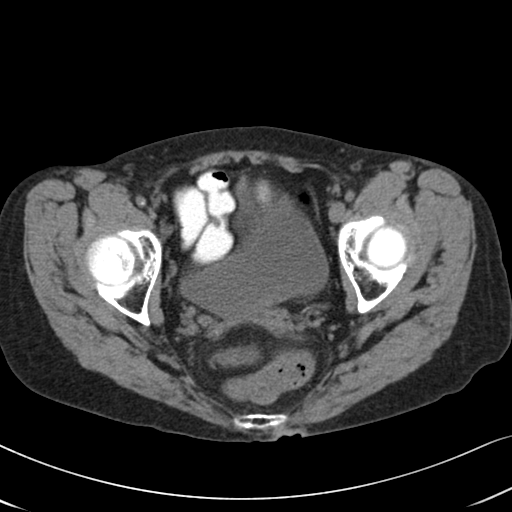
[im 41/145  soft-tissue]
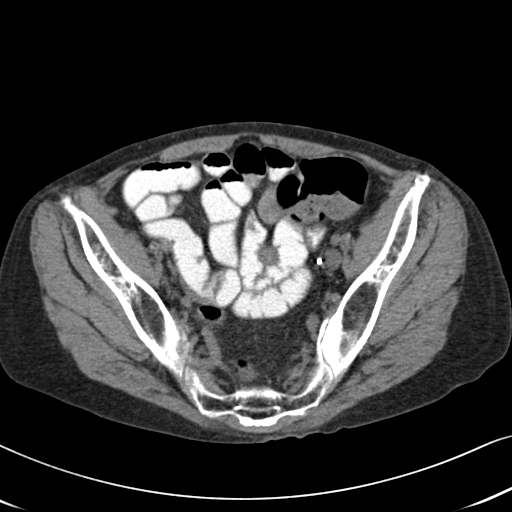
[im 52/145  soft-tissue]
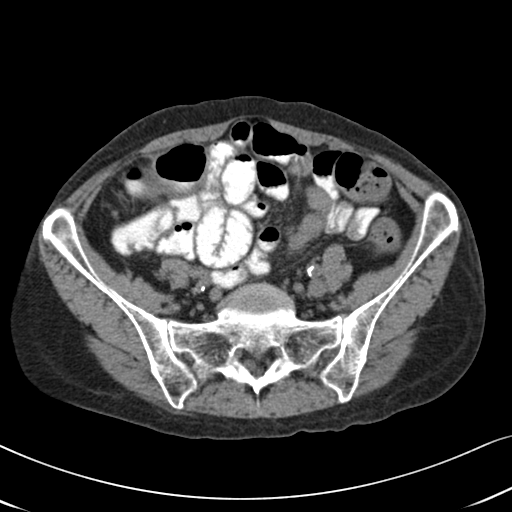
[im 64/145  soft-tissue]
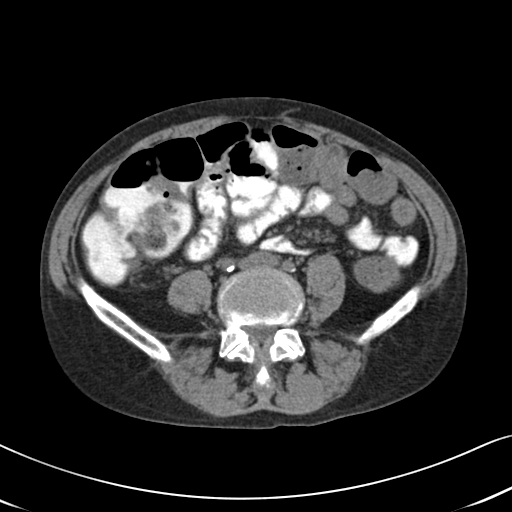
[im 75/145  soft-tissue]
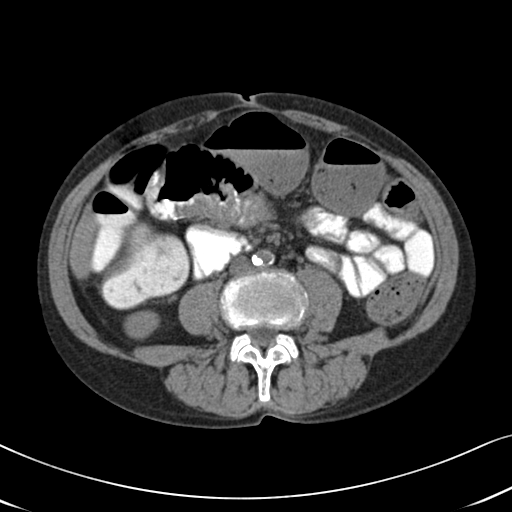
[im 81/145  soft-tissue]
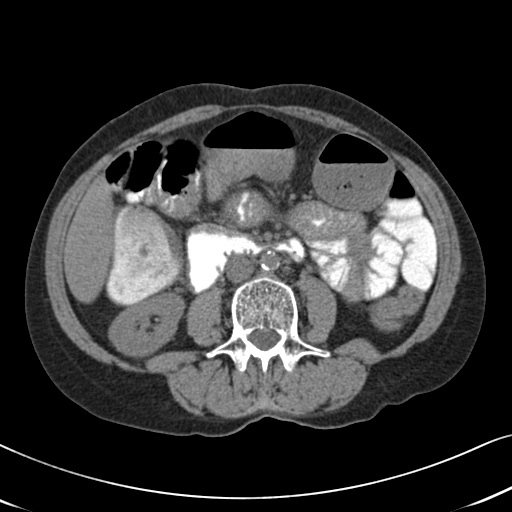
[im 93/145  soft-tissue]
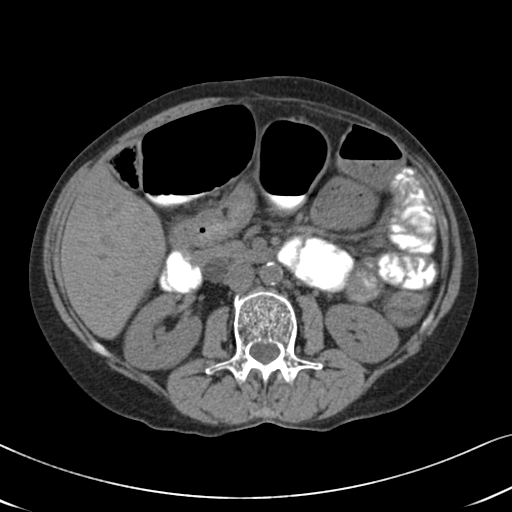
[im 93/145  bone]
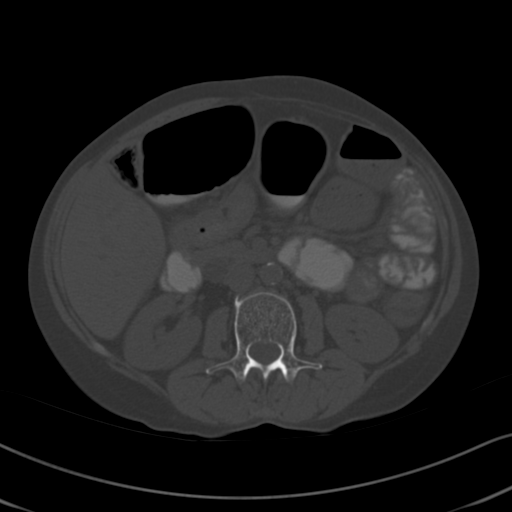
[im 104/145  soft-tissue]
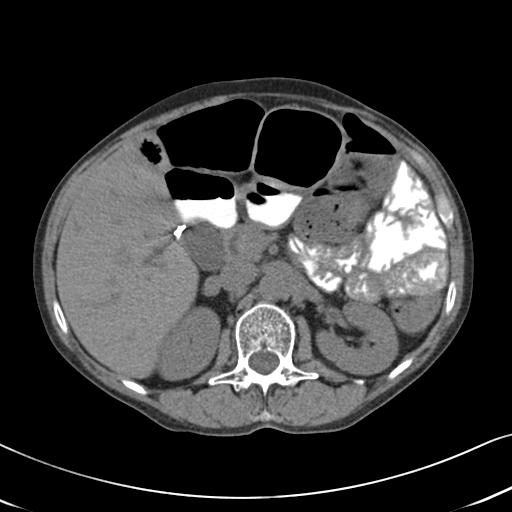
[im 116/145  soft-tissue]
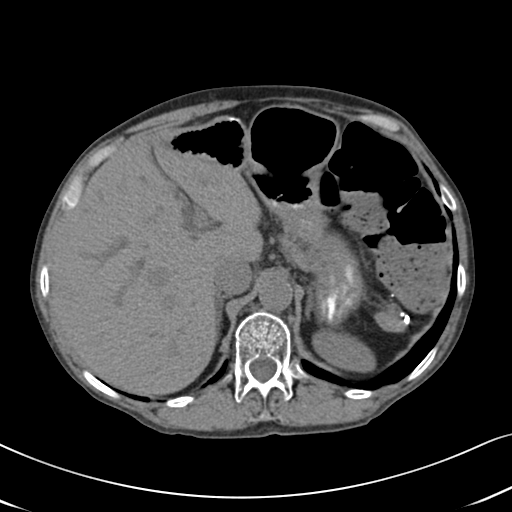
[im 121/145  lung]
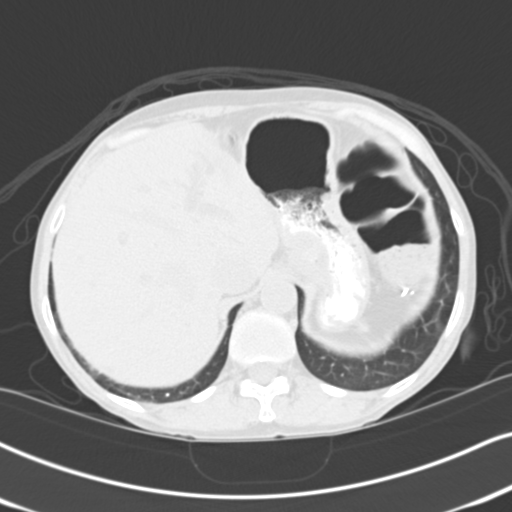
[im 127/145  soft-tissue]
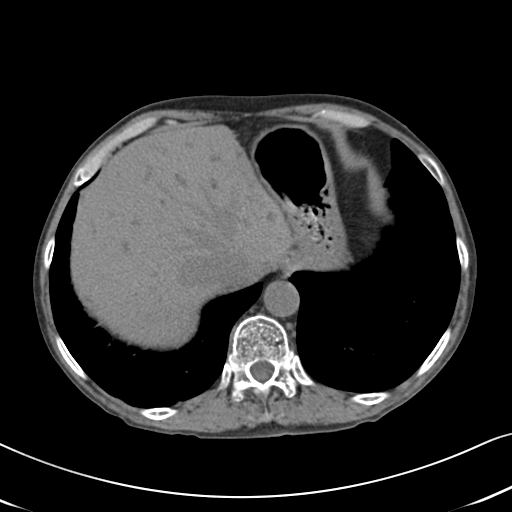
[im 127/145  lung]
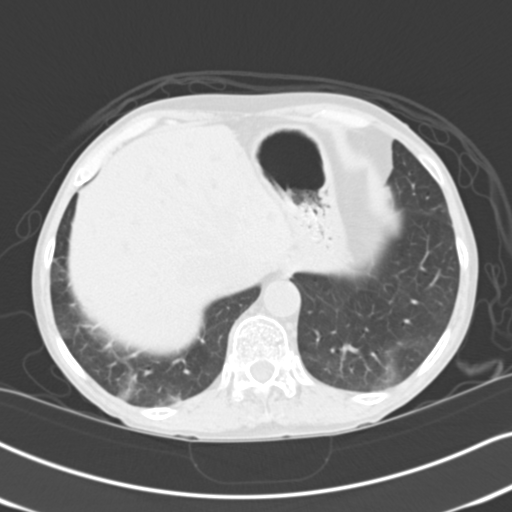
[im 133/145  lung]
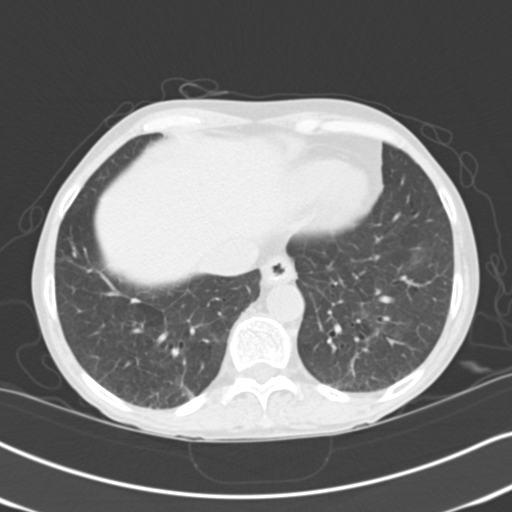
[im 139/145  soft-tissue]
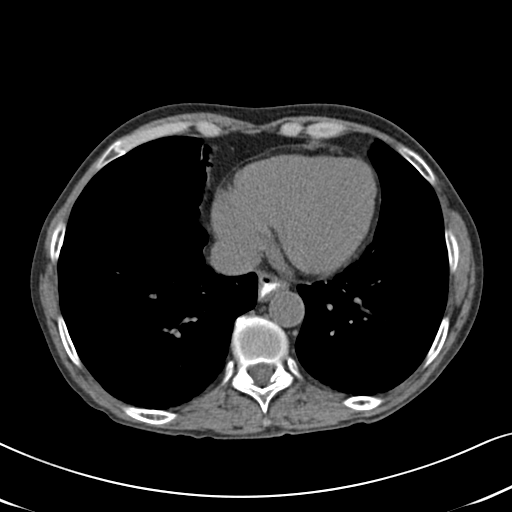
[im 139/145  lung]
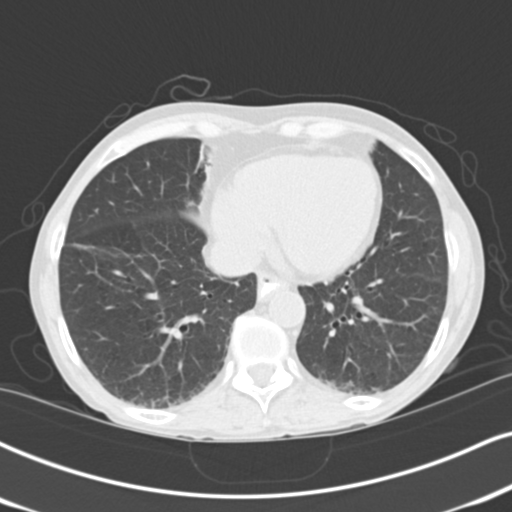

[15 of 32 positions shown; findings below may reference images not displayed]

PROCEDURE:     CT  - CT ABDOMEN AND PELVIS W[DATE]  [DATE]

RESULT:     Axial CT scanning was performed through the abdomen and pelvis
at 3 mm intervals and slice thicknesses following administration of oral
contrast only. The liver exhibits no focal mass. There may be a trace of
intrahepatic ductal dilation. The common bile duct is significantly dilated
measuring is much as 2.5 cm. The gallbladder is surgically absent. There is
minimal dilation of the pancreatic duct. I do not see pancreatic masses. The
spleen is surgically absent. The partially distended stomach is normal in
appearance. There are no adrenal masses. The kidneys are normal in
appearance.

The orally administered contrast has traversed the small bowel and reached
as far as the proximal transverse colon. The bowel gas pattern is fairly
nonspecific with there being stool and gas noted throughout the colon. I see
no small or large bowel obstruction. I see no free fluid in the abdomen or
pelvis. The urinary bladder is normal in appearance. The uterus is
surgically absent. I see no adnexal masses.

The lung bases exhibit emphysematous changes. The lumbar vertebral bodies
are preserved in height.
IMPRESSION: 1. The bowel gas pattern is fairly nonspecific. The pattern suggests an
ileus rather than an obstructive process. There is some thickening of what
is felt to likely be the terminal ileum consistent with known Crohn's
colitis. I do not see evidence of perforation or abscess formation.
2. There is dilation of the common bile duct which appears chronic. There is
a trace of intrahepatic ductal dilation.
3. I see no evidence of urinary tract obstruction.
4. There are emphysematous changes at the lung bases.

## 2013-02-22 IMAGING — CR DG ABDOMEN 3V
1 series · 3 of 3 positions shown · non-contrast
Comparison: none

REASON FOR EXAM: abd pain, CALL RESULTS TORE-JAN THINN [id number]
COMMENTS:

[Series 1: view not recorded · 0.17mm/px · 3 of 3 slices shown]
[im 1/3]
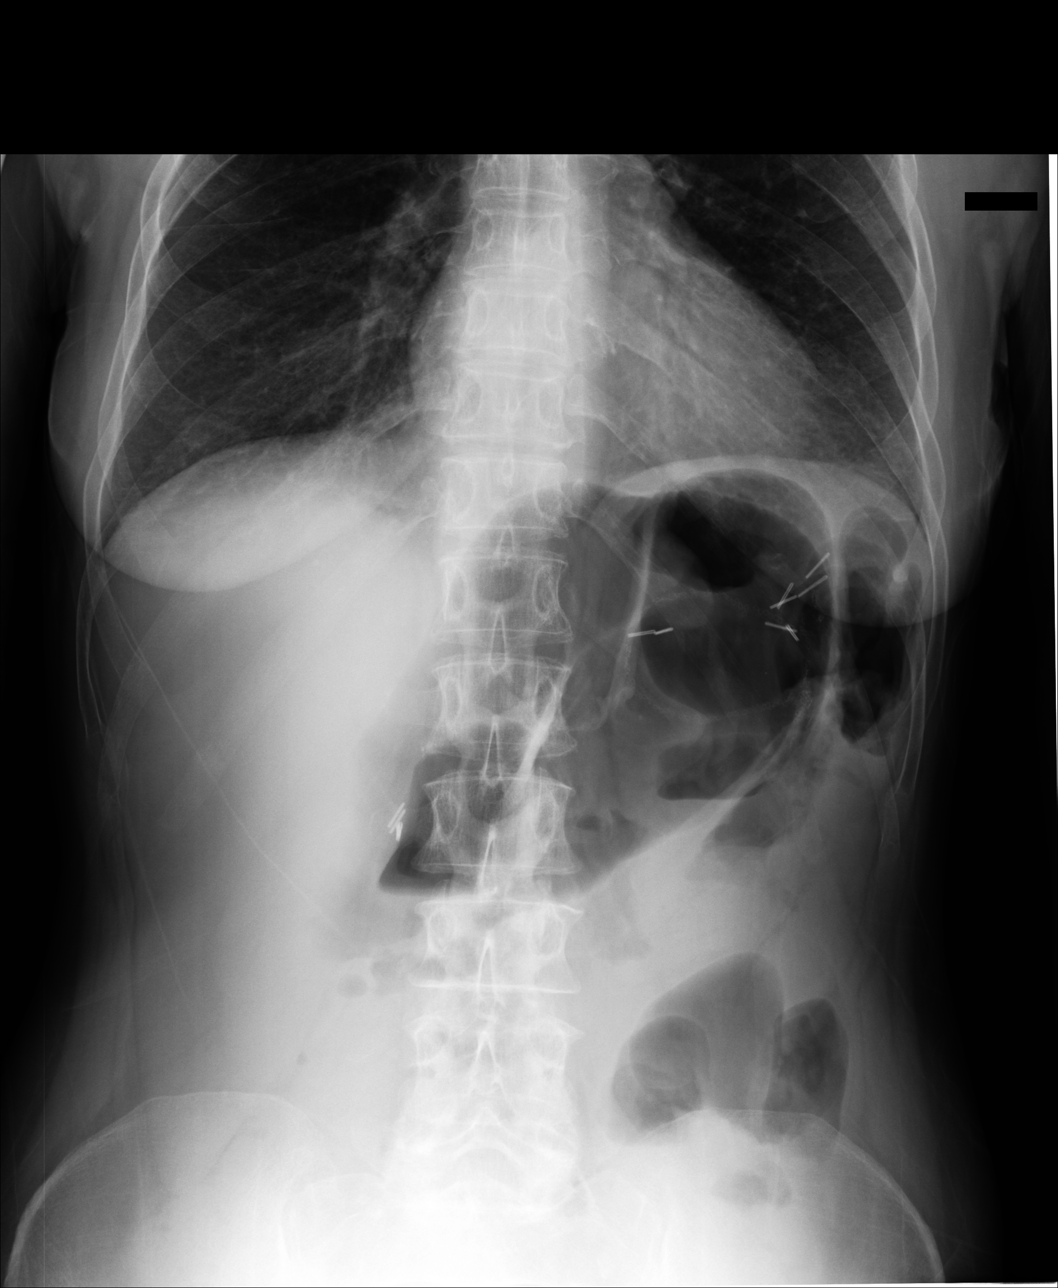
[im 2/3]
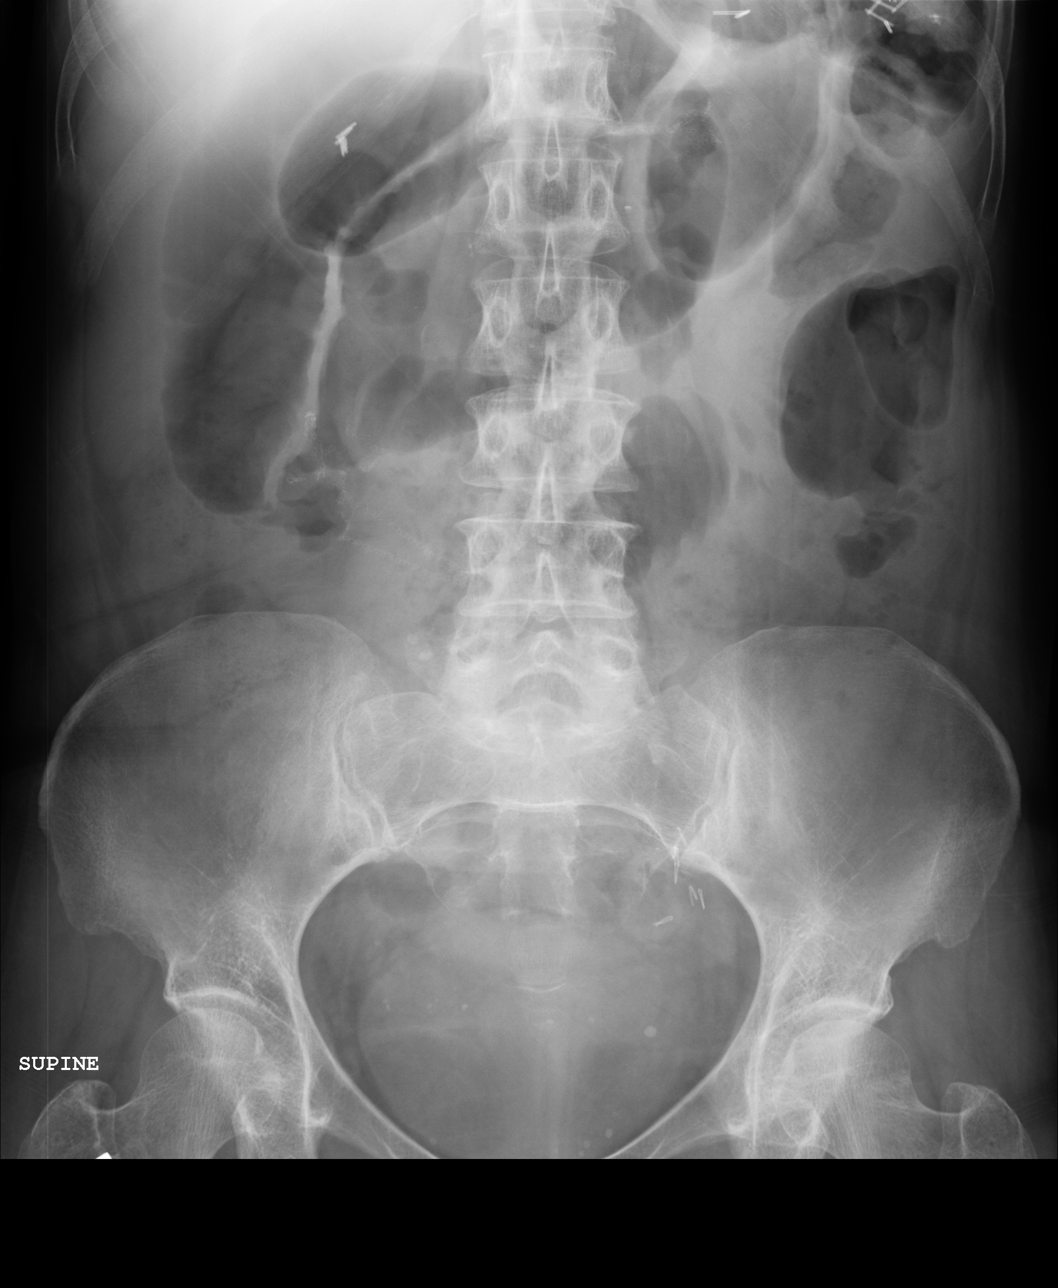
[im 3/3]
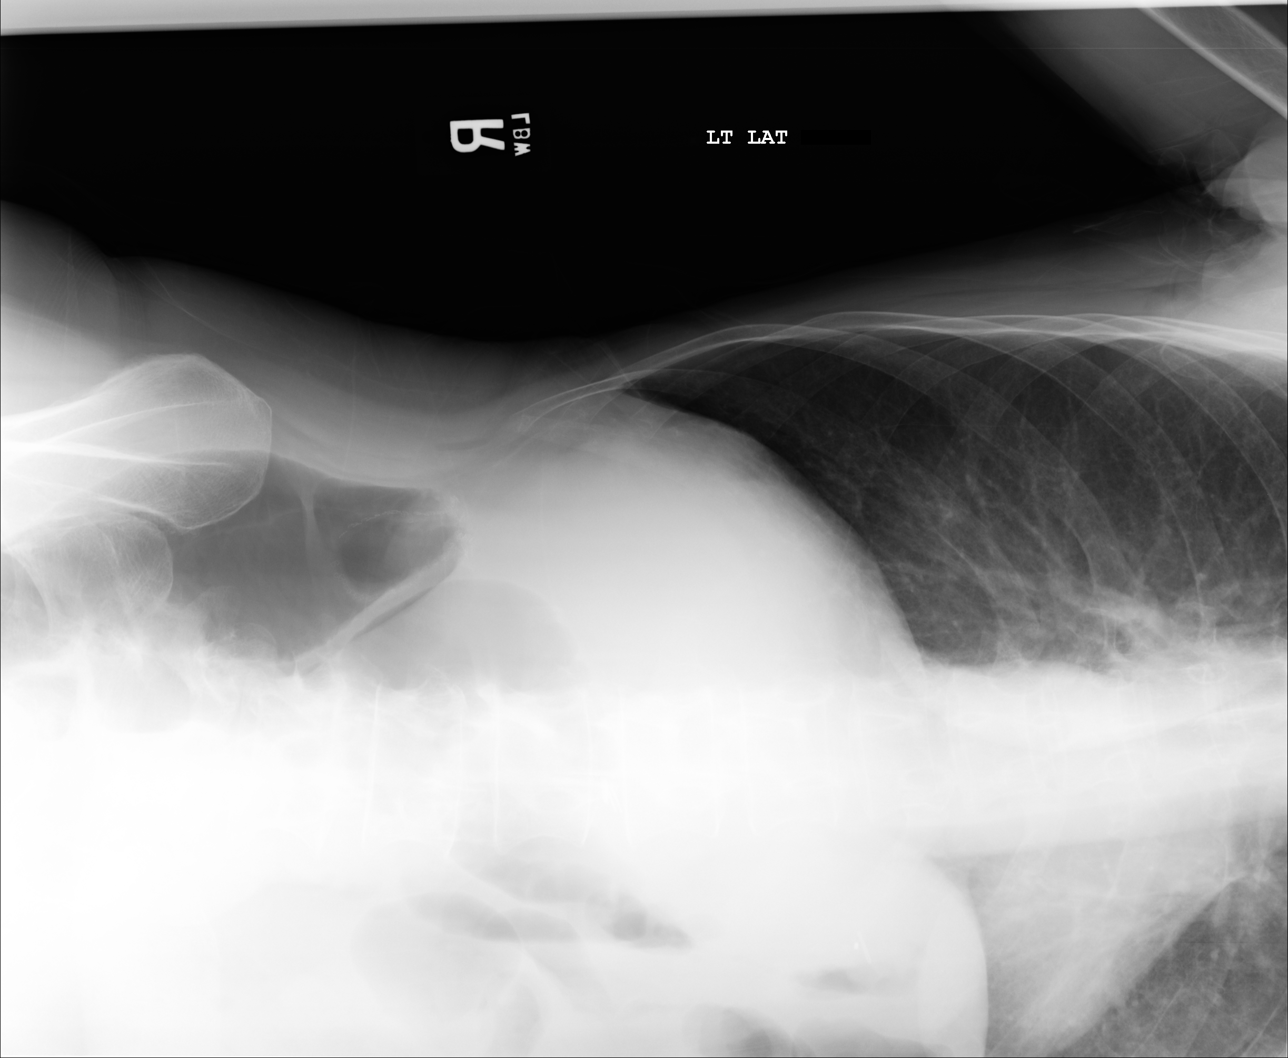

[3 of 3 positions shown; findings below may reference images not displayed]

PROCEDURE:     DXR - DXR ABDOMEN COMPLETE  - August 14, 2011  [DATE]

RESULT:     Comparison is made to the prior exam of 02/05/2011. No
subdiaphragmatic free air is observed. There are noted dilated bowel loops
in the upper mid abdomen. Air-fluid levels are seen in multiple small bowel
loops in the lateral decubitus view. There is visualized gas in the colon
but colonic gas is relatively decreased. Postoperative metallic clips are
seen in the left upper quadrant, right upper quadrant, right lower quadrant
and left pelvis. Partial small bowel obstruction cannot be excluded in this
point, particularly in view of the multiple postoperative abdominal changes
seen. The abdomen could be further evaluated abdominal CT with contrast or
upper GI series with small bowel follow through if clinically indicated. No
acute bony abnormalities are seen. Phleboliths are noted in the pelvic area
bilaterally.
IMPRESSION: 1. There are nonspecific dilated loops of bowel with air-fluid levels
evident. Partial small bowel obstruction cannot be excluded at this point.
The abdomen could be further evaluated by CT or upper GI series with small
bowel follow-through if clinically indicated.
2. No subdiaphragmatic free air is seen.
3. Postoperative abdominal clips are noted bilaterally.

## 2013-03-04 ENCOUNTER — Other Ambulatory Visit: Payer: Self-pay | Admitting: *Deleted

## 2013-03-05 NOTE — Addendum Note (Signed)
Addended by: Wynonia Lawman E on: 03/05/2013 04:46 PM   Modules accepted: Orders

## 2013-03-05 NOTE — Telephone Encounter (Signed)
Refill denied   Has not been seen in over a year, if she establishes care will need a 45 minute visit. And can refill clonazepam until appt.

## 2013-03-05 NOTE — Telephone Encounter (Signed)
Phone call with patient, appointment scheduled 05/11/13, 45 minute visit, would like refill of clonazepam

## 2013-03-07 MED ORDER — CLONAZEPAM 0.5 MG PO TABS: 0.5000 mg | ORAL_TABLET | Freq: Every day | ORAL | Status: DC

## 2013-03-07 NOTE — Addendum Note (Signed)
Addended by: Crecencio Mc on: 03/07/2013 03:44 PM   Modules accepted: Orders

## 2013-03-07 NOTE — Telephone Encounter (Signed)
Per original note, ok to refill since she has set up appt.  #30 with 2 refills

## 2013-05-11 ENCOUNTER — Ambulatory Visit (INDEPENDENT_AMBULATORY_CARE_PROVIDER_SITE_OTHER): Payer: PRIVATE HEALTH INSURANCE | Admitting: Internal Medicine

## 2013-05-11 ENCOUNTER — Encounter: Payer: Self-pay | Admitting: Internal Medicine

## 2013-05-11 VITALS — BP 99/66 | HR 86 | Temp 98.1°F | Resp 16 | Ht 62.0 in | Wt 105.8 lb

## 2013-05-11 DIAGNOSIS — G8929 Other chronic pain: Secondary | ICD-10-CM

## 2013-05-11 DIAGNOSIS — Z0000 Encounter for general adult medical examination without abnormal findings: Secondary | ICD-10-CM | POA: Insufficient documentation

## 2013-05-11 DIAGNOSIS — E559 Vitamin D deficiency, unspecified: Secondary | ICD-10-CM

## 2013-05-11 DIAGNOSIS — R5381 Other malaise: Secondary | ICD-10-CM

## 2013-05-11 DIAGNOSIS — N760 Acute vaginitis: Secondary | ICD-10-CM | POA: Insufficient documentation

## 2013-05-11 DIAGNOSIS — R109 Unspecified abdominal pain: Secondary | ICD-10-CM

## 2013-05-11 DIAGNOSIS — D649 Anemia, unspecified: Secondary | ICD-10-CM

## 2013-05-11 DIAGNOSIS — D62 Acute posthemorrhagic anemia: Secondary | ICD-10-CM

## 2013-05-11 DIAGNOSIS — R5383 Other fatigue: Secondary | ICD-10-CM

## 2013-05-11 DIAGNOSIS — R079 Chest pain, unspecified: Secondary | ICD-10-CM | POA: Insufficient documentation

## 2013-05-11 DIAGNOSIS — Z23 Encounter for immunization: Secondary | ICD-10-CM

## 2013-05-11 DIAGNOSIS — N390 Urinary tract infection, site not specified: Secondary | ICD-10-CM

## 2013-05-11 DIAGNOSIS — E538 Deficiency of other specified B group vitamins: Secondary | ICD-10-CM

## 2013-05-11 LAB — COMPREHENSIVE METABOLIC PANEL
BUN: 11 mg/dL (ref 6–23)
CO2: 19 mEq/L (ref 19–32)
Calcium: 9.5 mg/dL (ref 8.4–10.5)
Chloride: 111 mEq/L (ref 96–112)
Creatinine, Ser: 0.8 mg/dL (ref 0.4–1.2)
GFR: 72.21 mL/min (ref >60.00)
Glucose, Bld: 95 mg/dL (ref 70–99)
Total Bilirubin: 0.4 mg/dL (ref 0.3–1.2)

## 2013-05-11 LAB — CBC WITH DIFFERENTIAL/PLATELET
Basophils Relative: 0.4 % (ref 0.0–3.0)
Eosinophils Relative: 0.7 % (ref 0.0–5.0)
Lymphocytes Relative: 34.9 % (ref 12.0–46.0)
Monocytes Absolute: 0.7 10*3/uL (ref 0.1–1.0)
Monocytes Relative: 5.8 % (ref 3.0–12.0)
Neutrophils Relative %: 58.2 % (ref 43.0–77.0)
Platelets: 379 10*3/uL (ref 150.0–400.0)
RBC: 4.29 Mil/uL (ref 3.87–5.11)
WBC: 11.9 10*3/uL — ABNORMAL HIGH (ref 4.5–10.5)

## 2013-05-11 LAB — POCT URINALYSIS DIPSTICK
Bilirubin, UA: NEGATIVE
Blood, UA: NEGATIVE
Ketones, UA: NEGATIVE
Leukocytes, UA: NEGATIVE
Protein, UA: NEGATIVE
Spec Grav, UA: <=1.005
pH, UA: 5

## 2013-05-11 LAB — LIPID PANEL
Cholesterol: 215 mg/dL — ABNORMAL HIGH (ref 0–200)
Total CHOL/HDL Ratio: 3
Triglycerides: 121 mg/dL (ref 0.0–149.0)
VLDL: 24.2 mg/dL (ref 0.0–40.0)

## 2013-05-11 LAB — TSH: TSH: 1.96 u[IU]/mL (ref 0.35–5.50)

## 2013-05-11 MED ORDER — CLOBETASOL PROPIONATE 0.05 % EX OINT: TOPICAL_OINTMENT | Freq: Two times a day (BID) | CUTANEOUS | Status: DC

## 2013-05-11 NOTE — Assessment & Plan Note (Addendum)
Recurrent,   Atypical.  Multiple CRFs.   EKG and stat cardiac enzymes were negative for ischemic changes. Refer to [facility] for further evaluation

## 2013-05-11 NOTE — Progress Notes (Signed)
Patient ID: PHINLEY SCHALL, female   DOB: 26-Jul-1946, 67 y.o.   MRN: [id number]   Subjective:     Tamara [name] is a 67 y.o. female and is here for a comprehensive physical exam. The patient reports was last seen Jan 2013 .  She has been having chest pain for the last 2 weeks brought on at rest and with exertion  Which lasts 5 to 10 minutes.  She describes it as feeling heavy,  Not sharp,  ,  Sometimes radiates to the neck and sometimes starts in the neck and radiates to her chest  Thinks it may be brought on by emotional stress due to caring for her 75 yr old husband who has multiple health problems including dementa.  She has had prior stress testing by [facility] which was complete due to her mother's health issues,  Niow that her mother has passed m, she  wants to return to him to finish the testing that he wanted done.   She continues to smoke 1 pack os cigarrettes daily and and has frequetn episodes of abdominal pain and partial SBO secondary to ulceritive colitis and adhesions from prior colonic resction. Last colonoscopy 2010 for volvulus.   2) Knee pain,  History of DKD left knee s/p Synvisc injections x 2 by Dr Leanor Kail   3) dysuria x 1 week.  Took amoxicillin for 4 days at onset of pain .  Still burning,  UA was negative here in the office.  She is S/p hysterectomy,  Last pelvic exam over 3 years ago.      History   Social History  . Marital Status: Married    Spouse Name: N/A    Number of Children: N/A  . Years of Education: N/A   Occupational History  . Not on file.   Social History Main Topics  . Smoking status: Current Every Day Smoker  . Smokeless tobacco: Not on file     Comment: half to one pack of cigs per day  . Alcohol Use: No  . Drug Use: Not on file  . Sexually Active: Not on file   Other Topics Concern  . Not on file   Social History Narrative   Lives with spouse.   Health Maintenance  Topic Date Due  . Tetanus/tdap  11/03/1966  .  Colonoscopy  11/03/1997  . Zostavax  11/04/2007  . Mammogram  09/09/2008  . Influenza Vaccine  07/12/2013  . Pneumococcal Polysaccharide Vaccine Age 75 And Over  Completed    The following portions of the patient's history were reviewed and updated as appropriate: allergies, current medications, past family history, past medical history, past social history, past surgical history and problem list.  Review of Systems A comprehensive review of systems was negative.   Objective:  BP 99/66  Pulse 86  Temp(Src) 98.1 F (36.7 C) (Oral)  Resp 16  Ht 5' 2"  (1.575 m)  Wt 105 lb 12 oz (47.968 kg)  BMI 19.34 kg/m2  SpO2 96%  General Appearance:    Alert, cooperative, no distress, appears stated age  Head:    Normocephalic, without obvious abnormality, atraumatic  Eyes:    PERRL, conjunctiva/corneas clear, EOM's intact, fundi    benign, both eyes  Ears:    Normal TM's and external ear canals, both ears  Nose:   Nares normal, septum midline, mucosa normal, no drainage    or sinus tenderness  Throat:   Lips, mucosa, and tongue normal; teeth and gums  normal  Neck:   Supple, symmetrical, trachea midline, no adenopathy;    thyroid:  no enlargement/tenderness/nodules; no carotid   bruit or JVD  Back:     Symmetric, no curvature, ROM normal, no CVA tenderness  Lungs:     Clear to auscultation bilaterally, respirations unlabored  Chest Wall:    No tenderness or deformity   Heart:    Regular rate and rhythm, S1 and S2 normal, no murmur, rub   or gallop  Breast Exam:    No tenderness, masses, or nipple abnormality  Abdomen:     Soft, non-tender, bowel sounds active all four quadrants,    no masses, no organomegaly  Genitalia:    Pelvic: cervix normal in appearance, external genitalia normal, no adnexal masses or tenderness, no cervical motion tenderness, rectovaginal septum normal, uterus normal size, shape, and consistency and vagina normal without discharge  Extremities:   Extremities normal,  atraumatic, no cyanosis or edema  Pulses:   2+ and symmetric all extremities  Skin:   Skin color, texture, turgor normal, no rashes or lesions  Lymph nodes:   Cervical, supraclavicular, and axillary nodes normal  Neurologic:   CNII-XII intact, normal strength, sensation and reflexes    throughout    Assessment and Plan:   Routine general medical examination at a health care facility Annual comprehensive exam was done including breast and pelvic.  She refuses mammogram and is s/p  hysterectomy.   Chest pain Recurrent,   Atypical.  Multiple CRFs.   EKG and stat cardiac enzymes were negative for ischemic changes. Refer to [facility] for further evaluation   Vaginitis and vulvovaginitis, unspecified Pelvic exam notable only for atrophic vaginitis. I discussed use of hormonal therapy but consider it C/I due to her ongoing tobacco abuse.  Recommend trial of OTC emollients /lubricants, and trial of steroid cream   Chronic abdominal pain Secondary to IBD and adhesions.     Updated Medication List Outpatient Encounter Prescriptions as of 05/11/2013  Medication Sig Dispense Refill  . amitriptyline (ELAVIL) 50 MG tablet Take 1 tablet (50 mg total) by mouth at bedtime.  30 tablet  6  . clonazePAM (KLONOPIN) 0.5 MG tablet Take 1 tablet (0.5 mg total) by mouth daily.  30 tablet  3  . HYDROcodone-acetaminophen (VICODIN) 5-500 MG per tablet Take one by mouth as needed       . mesalamine (PENTASA) 250 MG CR capsule Take 250 mg by mouth 3 (three) times daily.        Marland Kitchen omeprazole (PRILOSEC) 20 MG capsule Take 2 by mouth daily       . promethazine (PHENERGAN) 25 MG tablet Take one by mouth as needed       . sertraline (ZOLOFT) 100 MG tablet Take 1 tablet (100 mg total) by mouth daily.  30 tablet  6  . zoledronic acid (RECLAST) 5 MG/100ML SOLN Inject 5 mg into the vein once. yearly       . acetaZOLAMIDE (DIAMOX) 500 MG capsule Take 1 capsule (500 mg total) by mouth daily.  30 capsule  1  . acetaZOLAMIDE (DIAMOX)  500 MG capsule Take 1 capsule (500 mg total) by mouth daily.  30 capsule  5  . budesonide (ENTOCORT EC) 3 MG 24 hr capsule Take one by mouth three times daily       . clobetasol ointment (TEMOVATE) 0.05 % Apply topically 2 (two) times daily. To labia For two to three weeks,  Then reduce use to twice per week  30 g  1  . clonazePAM (KLONOPIN) 0.5 MG tablet Take 1 tablet (0.5 mg total) by mouth daily.  30 tablet  0   No facility-administered encounter medications on file as of 05/11/2013.

## 2013-05-11 NOTE — Patient Instructions (Addendum)
You do not have a UTI  Your symptoms may be coming from atrophy. Due to menopause  We cannot use estrogen because of your risk for clots.  I will call in a steroid cream to use twice daily for 2 weeks,  Then reduc e use to twice a week  Referral to Dr Annice Needy for evaluaiton of chest pain  We will call you with the results of your labs today

## 2013-05-11 NOTE — Assessment & Plan Note (Signed)
Annual comprehensive exam was done including breast and pelvic.  She refuses mammogram and is s/p  hysterectomy.

## 2013-05-12 LAB — VITAMIN D 25 HYDROXY (VIT D DEFICIENCY, FRACTURES): Vit D, 25-Hydroxy: 26 ng/mL — ABNORMAL LOW (ref 30–89)

## 2013-05-12 LAB — CK TOTAL AND CKMB (NOT AT ARMC)
CK, MB: 1.1 ng/mL (ref 0.3–4.0)
Total CK: 63 U/L (ref 7–177)

## 2013-05-12 LAB — FOLATE RBC: RBC Folate: 377 ng/mL (ref >=366)

## 2013-05-13 NOTE — Assessment & Plan Note (Signed)
Pelvic exam notable only for atrophic vaginitis. I discussed use of hormonal therapy but consider it C/I due to her ongoing tobacco abuse.  Recommend trial of OTC emollients /lubricants, and trial of steroid cream

## 2013-05-13 NOTE — Assessment & Plan Note (Signed)
Secondary to IBD and adhesions.

## 2013-05-24 ENCOUNTER — Other Ambulatory Visit: Payer: Self-pay | Admitting: Internal Medicine

## 2013-05-24 ENCOUNTER — Telehealth: Payer: Self-pay | Admitting: Internal Medicine

## 2013-05-24 MED ORDER — ERGOCALCIFEROL 1.25 MG (50000 UT) PO CAPS: 50000.0000 [IU] | ORAL_CAPSULE | ORAL | Status: DC

## 2013-05-24 MED ORDER — CYANOCOBALAMIN 1000 MCG/ML IJ SOLN: 1000.0000 ug | INTRAMUSCULAR | Status: DC

## 2013-05-24 NOTE — Telephone Encounter (Signed)
Patient stated when she receives letter she will contact office about B12 injections.

## 2013-05-24 NOTE — Telephone Encounter (Signed)
Tamara [name],  I have printed a letter to go with the labs . Couldn't figure out how to incorporate them into letter.

## 2013-05-24 NOTE — Telephone Encounter (Signed)
No notes on these labs 05/12/13 some abnormal any orders before I send patient copy?

## 2013-05-24 NOTE — Telephone Encounter (Signed)
The patient is wanting her lab results and he TDAP prescription mailed. Please call patient after 12:00 pm.

## 2013-06-03 ENCOUNTER — Other Ambulatory Visit: Payer: Self-pay | Admitting: *Deleted

## 2013-06-04 MED ORDER — AMITRIPTYLINE HCL 50 MG PO TABS: 50.0000 mg | ORAL_TABLET | Freq: Every day | ORAL | Status: DC

## 2013-06-04 MED ORDER — ACETAZOLAMIDE ER 500 MG PO CP12: 500.0000 mg | ORAL_CAPSULE | Freq: Every day | ORAL | Status: DC

## 2013-06-12 LAB — HM MAMMOGRAPHY

## 2013-06-21 ENCOUNTER — Telehealth: Payer: Self-pay | Admitting: Internal Medicine

## 2013-06-21 DIAGNOSIS — Z1239 Encounter for other screening for malignant neoplasm of breast: Secondary | ICD-10-CM

## 2013-06-21 NOTE — Telephone Encounter (Signed)
The patient wants her mammogram scheduled. She does not remember which of her breast the lump was found in when she was seen by Dr. Derrel Nip.

## 2013-06-21 NOTE — Telephone Encounter (Signed)
Vanessa set up appointment for general Mammogram but patient called and is stating a lump was found by you but cannot tell what breast. [facility] stated they will do screening and if they find anything will call back for further orders. FYI

## 2013-06-21 NOTE — Telephone Encounter (Signed)
I do not remember either.  iwill order, but in the future she can order her own.

## 2013-06-21 NOTE — Telephone Encounter (Signed)
Office note from 05/11/13 does not mention any lump on breast only that patient refused Mammogram at this time because of HX Hysterectomy. Please advise.

## 2013-06-29 ENCOUNTER — Ambulatory Visit: Payer: Self-pay | Admitting: Internal Medicine

## 2013-07-05 ENCOUNTER — Other Ambulatory Visit: Payer: Self-pay | Admitting: *Deleted

## 2013-07-06 NOTE — Telephone Encounter (Signed)
Last OV 7/14 okay to fill?

## 2013-07-07 ENCOUNTER — Encounter: Payer: Self-pay | Admitting: Internal Medicine

## 2013-07-13 ENCOUNTER — Other Ambulatory Visit: Payer: Self-pay | Admitting: *Deleted

## 2013-07-14 NOTE — Telephone Encounter (Signed)
Last OV 05/11/13

## 2013-07-15 MED ORDER — CLONAZEPAM 0.5 MG PO TABS: 0.5000 mg | ORAL_TABLET | Freq: Every day | ORAL | Status: DC

## 2013-07-15 NOTE — Telephone Encounter (Signed)
Ok to refill clonazepam,  should have  printed rx

## 2013-09-01 ENCOUNTER — Ambulatory Visit: Payer: Self-pay | Admitting: Unknown Physician Specialty

## 2013-09-21 ENCOUNTER — Encounter: Payer: Self-pay | Admitting: Internal Medicine

## 2013-11-01 ENCOUNTER — Other Ambulatory Visit: Payer: Self-pay | Admitting: *Deleted

## 2013-11-01 MED ORDER — CLONAZEPAM 0.5 MG PO TABS: 0.5000 mg | ORAL_TABLET | Freq: Every day | ORAL | Status: DC

## 2013-11-01 NOTE — Telephone Encounter (Signed)
Ok refill? 

## 2013-11-01 NOTE — Telephone Encounter (Signed)
Rx faxed to pharmacy  

## 2013-11-08 IMAGING — MR MRI OF THE LEFT KNEE WITHOUT CONTRAST
6 series · 40 of 40 positions shown · non-contrast
Comparison: None

REASON FOR EXAM: left knee pain
COMMENTS:

PROCEDURE:     MMR - MMR KNEE LT WO CONTRAST  - April 29, 2012 [DATE]
RESULT:     History: Pain
TECHNIQUE: Multiplanar and multisequence MRI of the left knee was performed
without IV contrast.

[Series 3: T2 fat-sat · axial · 4.0mm · 0.62mm/px · z∈[-69,+125]mm · 6 of 27 slices shown (1 of 3)]
[im 1/27]
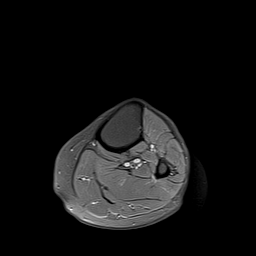
[im 6/27]
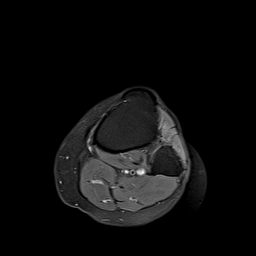
[im 11/27]
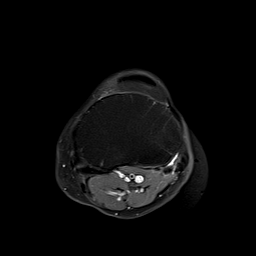
[im 16/27]
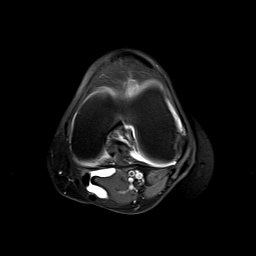
[im 21/27]
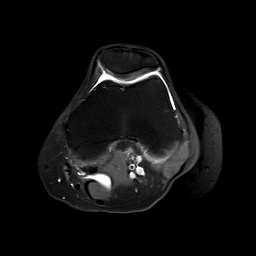
[im 27/27]
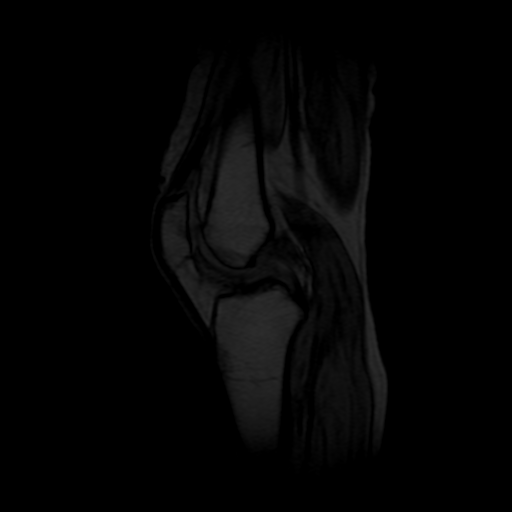

[Series 4: T1 · axial · 4.0mm · 0.62mm/px · z∈[-63,+125]mm · 6 of 27 slices shown (1 of 2)]
[im 1/27]
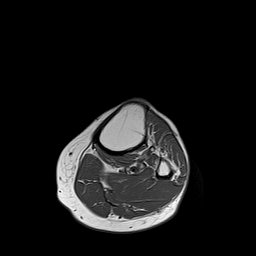
[im 6/27]
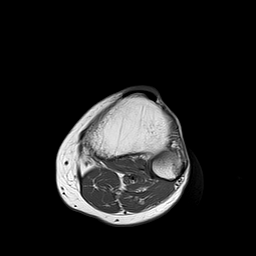
[im 11/27]
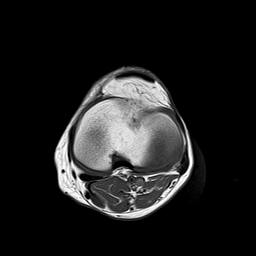
[im 16/27]
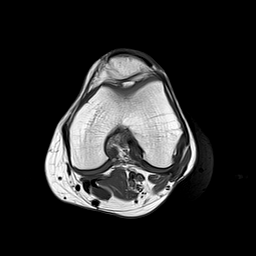
[im 21/27]
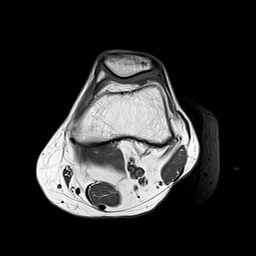
[im 27/27]
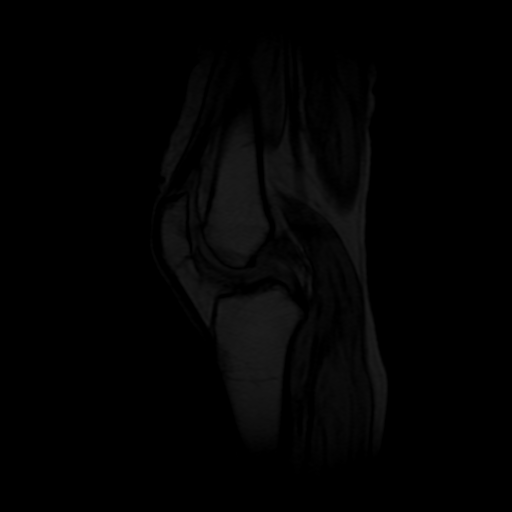

[Series 5: T2 fat-sat · axial · 4.0mm · 0.73mm/px · z∈[+10,+83]mm · 7 of 26 slices shown (2 of 3)]
[im 1/26]
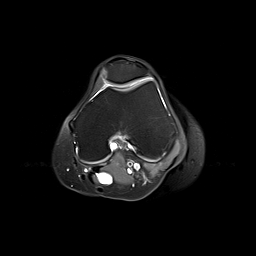
[im 5/26]
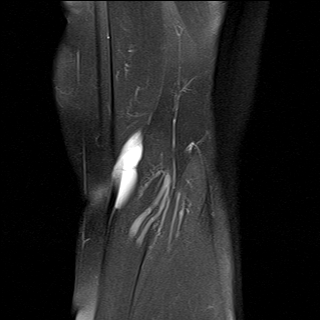
[im 9/26]
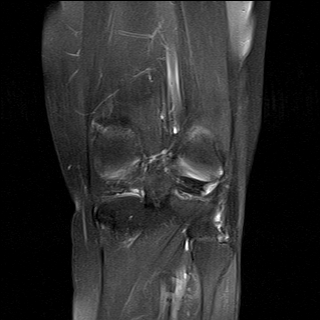
[im 13/26]
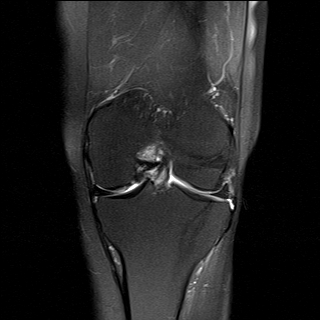
[im 17/26]
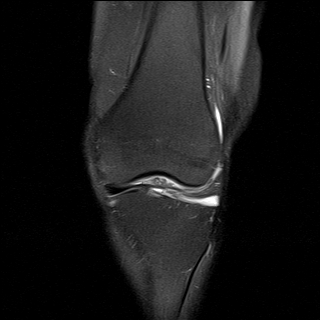
[im 21/26]
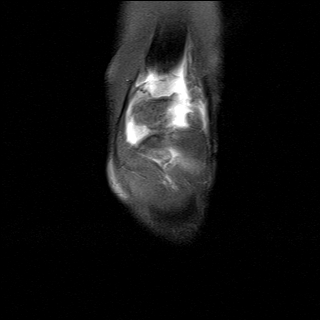
[im 26/26]
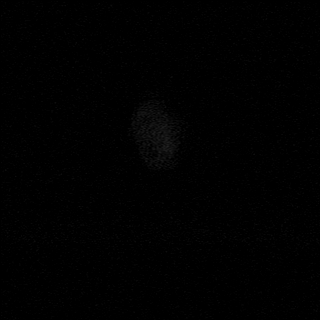

[Series 6: T1 · axial · 4.0mm · 0.73mm/px · z∈[+10,+83]mm · 7 of 26 slices shown (2 of 2)]
[im 1/26]
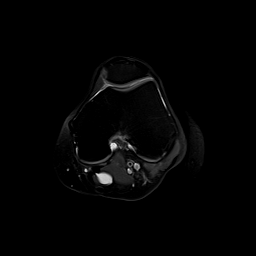
[im 5/26]
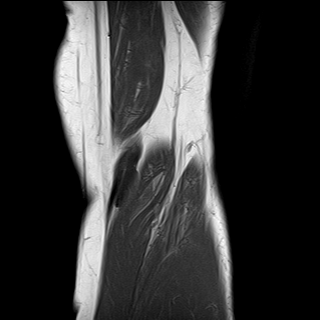
[im 9/26]
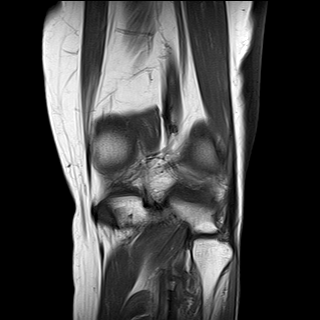
[im 13/26]
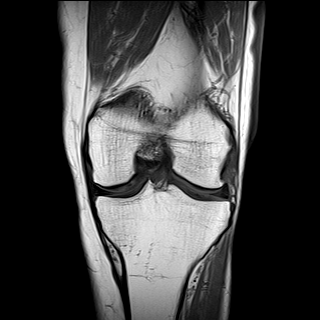
[im 17/26]
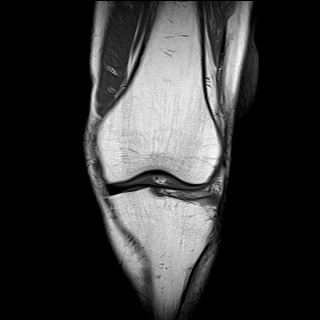
[im 21/26]
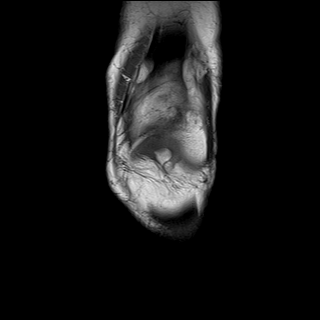
[im 26/26]
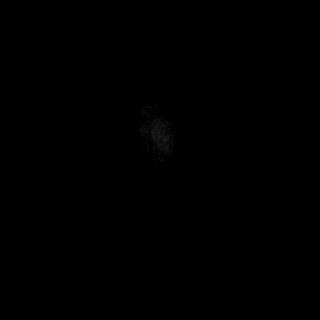

[Series 7: PD fat-sat · axial · 4.0mm · 0.86mm/px · z∈[+11,+73]mm · 7 of 27 slices shown]
[im 1/27]
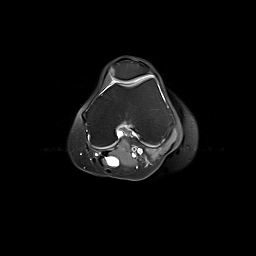
[im 5/27]
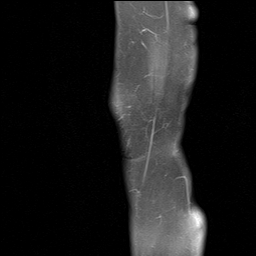
[im 9/27]
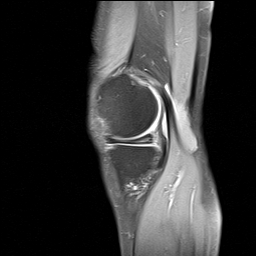
[im 14/27]
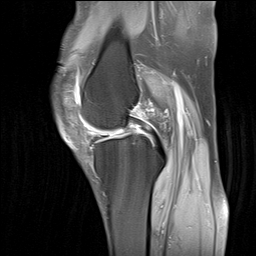
[im 18/27]
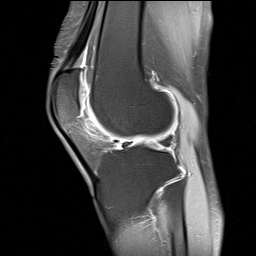
[im 22/27]
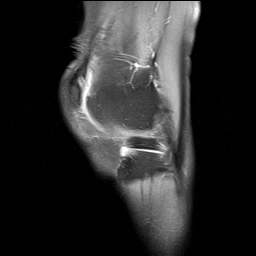
[im 27/27]
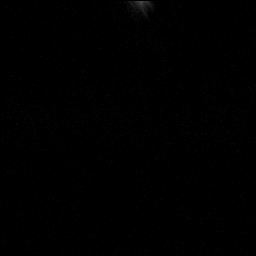

[Series 8: T2 fat-sat · axial · 4.0mm · 0.84mm/px · z∈[+11,+73]mm · 7 of 26 slices shown (3 of 3)]
[im 1/26]
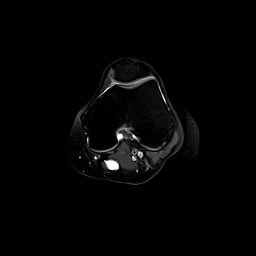
[im 5/26]
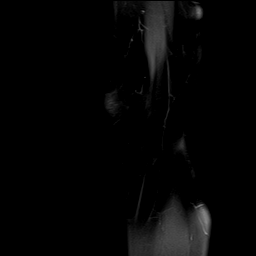
[im 9/26]
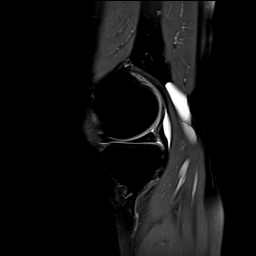
[im 13/26]
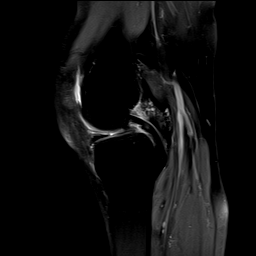
[im 17/26]
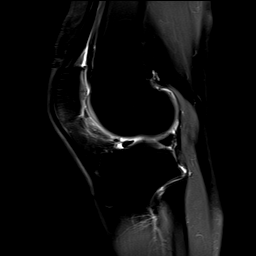
[im 21/26]
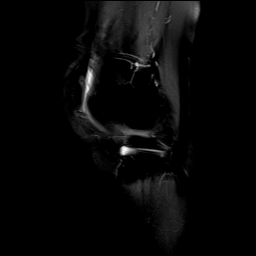
[im 26/26]
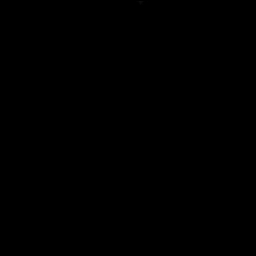

[40 of 40 positions shown; findings below may reference images not displayed]

FINDINGS: The lateral meniscus is intact. There is mild increased signal in the
posterior horn of the of the medial meniscus with which does not extend to
the articular surface.

The anterior and posterior cruciate ligaments are intact.

The medial collateral ligament and lateral collateral ligamentous complex
are intact.  The popliteus tendon is intact.

The retinacular complex is intact. The extensor mechanism is intact.

There is mild chondromalacia of the lateral patellar facet. There is partial
thickness cartilage loss of the lateral aspect of the medial femoral condyle.

There is a small Baker's cyst. There is no abnormal signal within Hoffa's
fat. There is no plical thickening. There is no bursal abnormality. There is
no abnormal bone marrow signal. There is no significant joint effusion.
IMPRESSION: 1. Mild cartilage abnormalities as described above.

2. There is mild increased signal within the posterior horn of the medial
meniscus which does not extend to the articular surface likely resenting
degeneration.

[REDACTED]

## 2013-11-11 HISTORY — PX: COLONOSCOPY: SHX174

## 2013-12-02 ENCOUNTER — Telehealth: Payer: Self-pay | Admitting: *Deleted

## 2013-12-02 NOTE — Telephone Encounter (Signed)
Patient requesting refills on Klonopin, Diamox, and Amitriptyline please advise as to refill.

## 2013-12-03 MED ORDER — CLONAZEPAM 0.5 MG PO TABS: 0.5000 mg | ORAL_TABLET | Freq: Every day | ORAL | Status: DC

## 2013-12-03 MED ORDER — ACETAZOLAMIDE ER 500 MG PO CP12: 500.0000 mg | ORAL_CAPSULE | Freq: Every day | ORAL | Status: DC

## 2013-12-03 MED ORDER — AMITRIPTYLINE HCL 50 MG PO TABS: 50.0000 mg | ORAL_TABLET | Freq: Every day | ORAL | Status: DC

## 2013-12-03 NOTE — Telephone Encounter (Signed)
Patient notified scripts sent.

## 2013-12-03 NOTE — Telephone Encounter (Signed)
You may refill each requested medication for one month only. She has not been seen since July she needs an appointment. The clonazepam has been printed

## 2013-12-15 ENCOUNTER — Telehealth: Payer: Self-pay | Admitting: Internal Medicine

## 2013-12-15 NOTE — Telephone Encounter (Signed)
Pt states her insurance will not cover amitriptyline.  States she has to have something else prescribed or an explanation to them for why she has to have it.  Ph: [phone]  Fx:  [phone].  Pt does not care if the med is changed to something different.

## 2013-12-16 NOTE — Telephone Encounter (Signed)
Have received form will put in red folder unless you decide to change medication.

## 2013-12-19 ENCOUNTER — Telehealth: Payer: Self-pay | Admitting: Internal Medicine

## 2013-12-19 NOTE — Telephone Encounter (Signed)
Why is she using amitriptyline?  To help her sleep?  To manage pain?  Abdominal pain ?

## 2013-12-20 MED ORDER — TRAZODONE HCL 50 MG PO TABS
25.0000 mg | ORAL_TABLET | Freq: Every evening | ORAL | Status: DC | PRN
Start: 2013-12-20 — End: 2014-04-08

## 2013-12-20 NOTE — Telephone Encounter (Signed)
Trial of trazodone  1 to 2 tablets 30 mintes prior to bedtime  Sent to [facility]

## 2013-12-20 NOTE — Telephone Encounter (Signed)
Patient stated it was prescribed during the time her mother was dying to help with sleep,now husband is ill and she needs medication to help with sleep. During his illness patient stated he has dementia and congestive heart failure, Patient is willing to try something else if you advise.

## 2013-12-20 NOTE — Telephone Encounter (Signed)
Patient notified as instructed.

## 2013-12-21 ENCOUNTER — Telehealth: Payer: Self-pay | Admitting: *Deleted

## 2013-12-21 NOTE — Telephone Encounter (Signed)
Received PA request form for the Amitriptylin, form placed in Dr.tullo box

## 2014-01-03 ENCOUNTER — Telehealth: Payer: Self-pay | Admitting: *Deleted

## 2014-01-03 MED ORDER — CLONAZEPAM 0.5 MG PO TABS: 0.5000 mg | ORAL_TABLET | Freq: Every day | ORAL | Status: DC

## 2014-01-03 MED ORDER — ACETAZOLAMIDE ER 500 MG PO CP12: 500.0000 mg | ORAL_CAPSULE | Freq: Every day | ORAL | Status: DC

## 2014-01-03 NOTE — Telephone Encounter (Signed)
Yes you can refill for 30 days to give her time to get an appt with me. Clonazepam and diamox.

## 2014-01-03 NOTE — Telephone Encounter (Signed)
Patient requesting through pharmacy refill for Clonopin and the Diamox Patient was notified on 12/03/13 needs OV for further refills. Tried to reach patiemnt to notify again but there is a phone record where patient was notified 12/03/13 FYI.

## 2014-01-03 NOTE — Telephone Encounter (Signed)
Pt returned call.  States she has been on these medications for years and does not understand why she cannot get a refill.  No avail appt with Dr. Derrel Nip until next week.  Pt states she is already out and cannot sleep without her meds.  Appt scheduled 2/26 with R. Rey, okay per [facility].  Pt asking if there is any way we can give her #3-4 pills to get her through to Thursday when she sees R. Rey.  Pt would prefer to see Dr. Derrel Nip if possible.

## 2014-01-03 NOTE — Telephone Encounter (Signed)
Called refill sent to [facility] as requested.

## 2014-01-06 ENCOUNTER — Ambulatory Visit: Payer: PRIVATE HEALTH INSURANCE | Admitting: Adult Health

## 2014-01-19 ENCOUNTER — Ambulatory Visit: Payer: PRIVATE HEALTH INSURANCE | Admitting: Internal Medicine

## 2014-01-26 ENCOUNTER — Ambulatory Visit (INDEPENDENT_AMBULATORY_CARE_PROVIDER_SITE_OTHER): Payer: Medicare Other | Admitting: Internal Medicine

## 2014-01-26 ENCOUNTER — Encounter: Payer: Self-pay | Admitting: Internal Medicine

## 2014-01-26 ENCOUNTER — Encounter (INDEPENDENT_AMBULATORY_CARE_PROVIDER_SITE_OTHER): Payer: Self-pay

## 2014-01-26 ENCOUNTER — Ambulatory Visit: Payer: Self-pay | Admitting: Internal Medicine

## 2014-01-26 VITALS — BP 120/70 | HR 80 | Temp 98.2°F | Ht 62.0 in | Wt 107.0 lb

## 2014-01-26 DIAGNOSIS — R5381 Other malaise: Secondary | ICD-10-CM

## 2014-01-26 DIAGNOSIS — J069 Acute upper respiratory infection, unspecified: Secondary | ICD-10-CM

## 2014-01-26 DIAGNOSIS — Z87891 Personal history of nicotine dependence: Secondary | ICD-10-CM | POA: Insufficient documentation

## 2014-01-26 DIAGNOSIS — E538 Deficiency of other specified B group vitamins: Secondary | ICD-10-CM | POA: Insufficient documentation

## 2014-01-26 DIAGNOSIS — Z720 Tobacco use: Secondary | ICD-10-CM

## 2014-01-26 DIAGNOSIS — R5383 Other fatigue: Secondary | ICD-10-CM

## 2014-01-26 DIAGNOSIS — H6690 Otitis media, unspecified, unspecified ear: Secondary | ICD-10-CM

## 2014-01-26 DIAGNOSIS — Z79899 Other long term (current) drug therapy: Secondary | ICD-10-CM

## 2014-01-26 DIAGNOSIS — Z7189 Other specified counseling: Secondary | ICD-10-CM

## 2014-01-26 DIAGNOSIS — E785 Hyperlipidemia, unspecified: Secondary | ICD-10-CM

## 2014-01-26 DIAGNOSIS — Z716 Tobacco abuse counseling: Secondary | ICD-10-CM

## 2014-01-26 DIAGNOSIS — E559 Vitamin D deficiency, unspecified: Secondary | ICD-10-CM | POA: Insufficient documentation

## 2014-01-26 DIAGNOSIS — F17200 Nicotine dependence, unspecified, uncomplicated: Secondary | ICD-10-CM

## 2014-01-26 LAB — COMPREHENSIVE METABOLIC PANEL
ALK PHOS: 46 U/L (ref 39–117)
ALT: 14 U/L (ref 0–35)
AST: 20 U/L (ref 0–37)
Albumin: 4.2 g/dL (ref 3.5–5.2)
BILIRUBIN TOTAL: 0.3 mg/dL (ref 0.3–1.2)
BUN: 11 mg/dL (ref 6–23)
CO2: 23 mEq/L (ref 19–32)
Calcium: 9.3 mg/dL (ref 8.4–10.5)
Chloride: 110 mEq/L (ref 96–112)
Creatinine, Ser: 0.9 mg/dL (ref 0.4–1.2)
GFR: 69.19 mL/min (ref >60.00)
GLUCOSE: 84 mg/dL (ref 70–99)
Potassium: 3.5 mEq/L (ref 3.5–5.1)
SODIUM: 140 meq/L (ref 135–145)
TOTAL PROTEIN: 6.7 g/dL (ref 6.0–8.3)

## 2014-01-26 LAB — CBC WITH DIFFERENTIAL/PLATELET
Basophils Absolute: 0.1 10*3/uL (ref 0.0–0.1)
Basophils Relative: 0.9 % (ref 0.0–3.0)
Eosinophils Absolute: 0.1 10*3/uL (ref 0.0–0.7)
Eosinophils Relative: 1.1 % (ref 0.0–5.0)
HEMATOCRIT: 42.4 % (ref 36.0–46.0)
HEMOGLOBIN: 14.1 g/dL (ref 12.0–15.0)
LYMPHS ABS: 3.9 10*3/uL (ref 0.7–4.0)
Lymphocytes Relative: 39 % (ref 12.0–46.0)
MCHC: 33.2 g/dL (ref 30.0–36.0)
MCV: 103.1 fl — AB (ref 78.0–100.0)
MONO ABS: 0.8 10*3/uL (ref 0.1–1.0)
MONOS PCT: 8.1 % (ref 3.0–12.0)
NEUTROS ABS: 5.1 10*3/uL (ref 1.4–7.7)
Neutrophils Relative %: 50.9 % (ref 43.0–77.0)
Platelets: 308 10*3/uL (ref 150.0–400.0)
RBC: 4.11 Mil/uL (ref 3.87–5.11)
RDW: 13.7 % (ref 11.5–14.6)
WBC: 10.1 10*3/uL (ref 4.5–10.5)

## 2014-01-26 LAB — LDL CHOLESTEROL, DIRECT: Direct LDL: 121.1 mg/dL

## 2014-01-26 LAB — TSH: TSH: 1.2 u[IU]/mL (ref 0.35–5.50)

## 2014-01-26 LAB — VITAMIN B12: Vitamin B-12: 263 pg/mL (ref 211–911)

## 2014-01-26 MED ORDER — PREDNISONE (PAK) 10 MG PO TABS: ORAL_TABLET | ORAL | Status: DC

## 2014-01-26 MED ORDER — AMOXICILLIN-POT CLAVULANATE 875-125 MG PO TABS: 1.0000 | ORAL_TABLET | Freq: Two times a day (BID) | ORAL | Status: DC

## 2014-01-26 MED ORDER — FLUTICASONE PROPIONATE 50 MCG/ACT NA SUSP: NASAL | Status: DC

## 2014-01-26 NOTE — Patient Instructions (Addendum)
We are tapering your diamox to off.  decrease diamox to every other day for one week,  Then stop   Your left ear looks a little inflamed .  I am prescribing a predisone taper and a steroid nasal spray. If no better in 2 or 3 days,  You may start the antibiotic

## 2014-01-26 NOTE — Progress Notes (Signed)
Patient ID: Tamara [name], female   DOB: [birth year], 68 y.o.   MRN: [id number]  Patient Active Problem List   Diagnosis Date Noted  . Acute upper respiratory infections of unspecified site 01/29/2014  . Tobacco abuse counseling 01/26/2014  . Unspecified vitamin D deficiency 01/26/2014  . B12 deficiency 01/26/2014  . Tobacco abuse 01/26/2014  . Routine general medical examination at a health care facility 05/11/2013  . Chest pain 05/11/2013  . Osteoporosis, unspecified 11/19/2011  . Chronic abdominal pain   . Bowel obstruction   . Crohn's disease   . History of DVT (deep vein thrombosis) 01/10/2007    Subjective:  CC:   Chief Complaint  Patient presents with  . Medication Refill    HPI:   Tamara [name] is a 68 y.o. female who presents for Follow up on chronic conditions including chronic abdominal pain.  Anxiety, tobacco abuse and insomnia.   Sinus pressure,  Frontal bilateral, aggravated by outside work ,  [employer] with bending over,    Going on for over a week.   She has been using trazadone instead of elavil bc her insurance would no longer pay for it. She is sleeping ok. Husband has heart failure, dementia and DM.,  Stressed out by caring for him   She had a recent episode of abdominal pain  Suggestive of a Crohn's flare and was seen by Dr. Vira Agar.  She has a history of 7 operations and adhesions.     She has been having left sided rib pain , worse with deep breathing,  Which started after hearing a "crack:' when she leaned on a sink counter    Past Medical History  Diagnosis Date  . Osteoporosis   . Chronic abdominal pain   . Crohn's disease   . History of DVT (deep vein thrombosis) 01/2007    post operative right leg, s/p vena cava filter  . Bowel obstruction     s/p laparotomy/ LOA 3/08, prior reversal of jejunal bypasss 2004    Past Surgical History  Procedure Laterality Date  . Cholecystectomy    . Cervical fusion  2005    C6-7, anterior approach   . 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 Rotator cuff repair      right  . Appendectomy    . Gyn surgery      hysterectomy  . Laparotomy  01/30/07    for bowel obstruction with LOA  . Cataract extraction    . Nissen fundoplication    . Abdominal hysterectomy         The following portions of the patient's history were reviewed and updated as appropriate: Allergies, current medications, and problem list.    Review of Systems:   Patient denies headache, fevers, malaise, unintentional weight loss, skin rash, eye pain, sinus congestion and sinus pain, sore throat, dysphagia,  hemoptysis , cough, dyspnea, wheezing, chest pain, palpitations, orthopnea, edema, abdominal pain, nausea, melena, diarrhea, constipation, flank pain, dysuria, hematuria, urinary  Frequency, nocturia, numbness, tingling, seizures,  Focal weakness, Loss of consciousness,  Tremor, insomnia, depression, anxiety, and suicidal ideation.     History   Social History  . Marital Status: Married    Spouse Name: N/A    Number of Children: N/A  . Years of Education: N/A   Occupational History  . Not on file.   Social History Main Topics  . Smoking status: Current Every Day Smoker  . Smokeless tobacco: Not on file  Comment: half to one pack of cigs per day  . Alcohol Use: No  . Drug Use: Not on file  . Sexual Activity: Not on file   Other Topics Concern  . Not on file   Social History Narrative   Lives with spouse.    Objective:  Filed Vitals:   01/26/14 1311  BP: 120/70  Pulse: 80  Temp: 98.2 F (36.8 C)     General appearance: alert, cooperative and appears stated age Ears: normal TM's and external ear canals both ears Throat: lips, mucosa, and tongue normal; teeth and gums normal Neck: no adenopathy, no carotid bruit, supple, symmetrical, trachea midline and thyroid not enlarged, symmetric, no tenderness/mass/nodules Back: symmetric, no curvature. ROM normal. No CVA  tenderness. Lungs: clear to auscultation bilaterally Heart: regular rate and rhythm, S1, S2 normal, no murmur, click, rub or gallop Abdomen: soft, non-tender; bowel sounds normal; no masses,  no organomegaly Pulses: 2+ and symmetric Skin: Skin color, texture, turgor normal. No rashes or lesions Lymph nodes: Cervical, supraclavicular, and axillary nodes normal.  Assessment and Plan:  Acute upper respiratory infections of unspecified site Prednisone taper and steroid nasal spray  Prescribed for persistent congestion .   Tobacco abuse counseling The patient was counseled on the dangers of tobacco use, and was advised to quit.  Reviewed strategies to maximize success, including removing cigarettes and smoking materials from environment.   Unspecified vitamin D deficiency Repeat Vitamin D is low at 62.  Will prescribe Drisdol x 1 month   Updated Medication List Outpatient Encounter Prescriptions as of 01/26/2014  Medication Sig  . budesonide (ENTOCORT EC) 3 MG 24 hr capsule Take one by mouth three times daily   . clobetasol ointment (TEMOVATE) 0.05 % Apply topically 2 (two) times daily. To labia For two to three weeks,  Then reduce use to twice per week  . clonazePAM (KLONOPIN) 0.5 MG tablet Take 1 tablet (0.5 mg total) by mouth daily.  . cyanocobalamin (,VITAMIN B-12,) 1000 MCG/ML injection 1000 mcg IM injection weekly for 3 weeks,  Then continue to inject I ml IM monthly  . HYDROcodone-acetaminophen (VICODIN) 5-500 MG per tablet Take one by mouth as needed   . mesalamine (PENTASA) 250 MG CR capsule Take 250 mg by mouth 3 (three) times daily.    Marland Kitchen omeprazole (PRILOSEC) 20 MG capsule Take 2 by mouth daily   . promethazine (PHENERGAN) 25 MG tablet Take one by mouth as needed   . sertraline (ZOLOFT) 100 MG tablet Take 1 tablet (100 mg total) by mouth daily.  . traZODone (DESYREL) 50 MG tablet Take 0.5-1 tablets (25-50 mg total) by mouth at bedtime as needed for sleep.  Marland Kitchen zoledronic acid  (RECLAST) 5 MG/100ML SOLN Inject 5 mg into the vein once. yearly   . [DISCONTINUED] acetaZOLAMIDE (DIAMOX) 500 MG capsule Take 1 capsule (500 mg total) by mouth daily.  . [DISCONTINUED] amitriptyline (ELAVIL) 50 MG tablet Take 1 tablet (50 mg total) by mouth at bedtime.  . [DISCONTINUED] cyanocobalamin (,VITAMIN B-12,) 1000 MCG/ML injection Inject 1 mL (1,000 mcg total) into the muscle once a week. I ml IM injection monthly  . [DISCONTINUED] ergocalciferol (DRISDOL) 50000 UNITS capsule Take 1 capsule (50,000 Units total) by mouth once a week.  Marland Kitchen amoxicillin-clavulanate (AUGMENTIN) 875-125 MG per tablet Take 1 tablet by mouth 2 (two) times daily.  . ergocalciferol (DRISDOL) 50000 UNITS capsule Take 1 capsule (50,000 Units total) by mouth once a week.  . fluticasone (FLONASE) 50 MCG/ACT nasal spray 2  sprays in each nostril once daily  . predniSONE (STERAPRED UNI-PAK) 10 MG tablet 6 tablets on Day 1 , then reduce by 1 tablet daily until gone  . [DISCONTINUED] clonazePAM (KLONOPIN) 0.5 MG tablet Take 1 tablet (0.5 mg total) by mouth daily.     Orders Placed This Encounter  Procedures  . CBC with Differential  . Comprehensive metabolic panel  . TSH  . Vit D  25 hydroxy (rtn osteoporosis monitoring)  . LDL cholesterol, direct  . Vitamin B12    Return in about 6 months (around 07/29/2014).

## 2014-01-26 NOTE — Progress Notes (Signed)
Pre-visit discussion using our clinic review tool. No additional management support is needed unless otherwise documented below in the visit note.  

## 2014-01-27 ENCOUNTER — Telehealth: Payer: Self-pay | Admitting: Internal Medicine

## 2014-01-27 LAB — VITAMIN D 25 HYDROXY (VIT D DEFICIENCY, FRACTURES): VIT D 25 HYDROXY: 27 ng/mL — AB (ref 30–89)

## 2014-01-27 MED ORDER — ERGOCALCIFEROL 1.25 MG (50000 UT) PO CAPS: 50000.0000 [IU] | ORAL_CAPSULE | ORAL | Status: DC

## 2014-01-27 NOTE — Telephone Encounter (Signed)
Relevant patient education mailed to patient.  

## 2014-01-28 MED ORDER — CYANOCOBALAMIN 1000 MCG/ML IJ SOLN: INTRAMUSCULAR | Status: DC

## 2014-01-29 ENCOUNTER — Encounter: Payer: Self-pay | Admitting: Internal Medicine

## 2014-01-29 DIAGNOSIS — J069 Acute upper respiratory infection, unspecified: Secondary | ICD-10-CM | POA: Insufficient documentation

## 2014-01-29 NOTE — Assessment & Plan Note (Signed)
The patient was counseled on the dangers of tobacco use, and was advised to quit.  Reviewed strategies to maximize success, including removing cigarettes and smoking materials from environment. 

## 2014-01-29 NOTE — Assessment & Plan Note (Signed)
Prednisone taper and steroid nasal spray  Prescribed for persistent congestion .

## 2014-01-29 NOTE — Assessment & Plan Note (Signed)
Repeat Vitamin D is low at 27.  Will prescribe Drisdol x 1 month

## 2014-01-31 ENCOUNTER — Other Ambulatory Visit: Payer: Self-pay | Admitting: Internal Medicine

## 2014-01-31 ENCOUNTER — Telehealth: Payer: Self-pay | Admitting: Internal Medicine

## 2014-01-31 MED ORDER — CLONAZEPAM 0.5 MG PO TABS: 0.5000 mg | ORAL_TABLET | Freq: Every day | ORAL | Status: DC

## 2014-01-31 NOTE — Telephone Encounter (Signed)
The patient returned your call and she is also wanting a refill on her clonazePAM (KLONOPIN) 0.5 MG tablet.

## 2014-01-31 NOTE — Telephone Encounter (Signed)
She can take ibuprofen 600 mg every 8 hours and tylenol 1000 mg every 8 hours as needed for pain

## 2014-01-31 NOTE — Telephone Encounter (Signed)
Patient notified of results and that I had already sent refill request for Clonazepam, Patient stated she is still having pain under left scapula on scale  Of 6 out of 10, Please advise?

## 2014-01-31 NOTE — Telephone Encounter (Signed)
Ok to fill 

## 2014-01-31 NOTE — Telephone Encounter (Signed)
Tried to call patient no answer unable to leave voicemail no answer.

## 2014-01-31 NOTE — Telephone Encounter (Signed)
X rays were negative for rib  fractures

## 2014-01-31 NOTE — Telephone Encounter (Signed)
Tried to reach patient no answer no voicemail.

## 2014-02-16 ENCOUNTER — Telehealth: Payer: Self-pay | Admitting: *Deleted

## 2014-02-16 MED ORDER — SPIRONOLACTONE 25 MG PO TABS: 25.0000 mg | ORAL_TABLET | Freq: Every day | ORAL | Status: DC

## 2014-02-16 NOTE — Telephone Encounter (Signed)
Rx for spironolactone sent to pharmacy  Prn fluid retention

## 2014-02-16 NOTE — Telephone Encounter (Signed)
Patient notified

## 2014-02-16 NOTE — Telephone Encounter (Signed)
Patient C\O since the D/C of Diamox has had Bilateral feet, ankles, and hand swelling and wanted to know if something could be called in for fluid retention?

## 2014-03-08 ENCOUNTER — Ambulatory Visit (INDEPENDENT_AMBULATORY_CARE_PROVIDER_SITE_OTHER)
Admission: RE | Admit: 2014-03-08 | Discharge: 2014-03-08 | Disposition: A | Discharge status: Home / Self Care | Payer: Medicare Other | Source: Ambulatory Visit | Attending: Family Medicine | Admitting: Family Medicine

## 2014-03-08 ENCOUNTER — Encounter: Payer: Self-pay | Admitting: Family Medicine

## 2014-03-08 ENCOUNTER — Telehealth: Payer: Self-pay | Admitting: Internal Medicine

## 2014-03-08 ENCOUNTER — Ambulatory Visit (INDEPENDENT_AMBULATORY_CARE_PROVIDER_SITE_OTHER): Payer: Medicare Other | Admitting: Family Medicine

## 2014-03-08 VITALS — BP 108/60 | HR 73 | Temp 97.8°F | Ht 61.25 in | Wt 104.5 lb

## 2014-03-08 DIAGNOSIS — R05 Cough: Secondary | ICD-10-CM

## 2014-03-08 DIAGNOSIS — G5600 Carpal tunnel syndrome, unspecified upper limb: Secondary | ICD-10-CM

## 2014-03-08 DIAGNOSIS — R200 Anesthesia of skin: Secondary | ICD-10-CM

## 2014-03-08 DIAGNOSIS — R5383 Other fatigue: Secondary | ICD-10-CM

## 2014-03-08 DIAGNOSIS — R209 Unspecified disturbances of skin sensation: Secondary | ICD-10-CM

## 2014-03-08 DIAGNOSIS — R059 Cough, unspecified: Secondary | ICD-10-CM

## 2014-03-08 DIAGNOSIS — R051 Acute cough: Secondary | ICD-10-CM | POA: Insufficient documentation

## 2014-03-08 DIAGNOSIS — R531 Weakness: Secondary | ICD-10-CM | POA: Insufficient documentation

## 2014-03-08 DIAGNOSIS — R5381 Other malaise: Secondary | ICD-10-CM

## 2014-03-08 HISTORY — DX: Carpal tunnel syndrome, unspecified upper limb: G56.00

## 2014-03-08 NOTE — Telephone Encounter (Signed)
Appt at [facility] at 11:45, Tamara [name]

## 2014-03-08 NOTE — Telephone Encounter (Signed)
Patient Information:  Caller Name: [place]aley[place]  Phone: [phone]  Patient: Tamara [name], Tamara [name]  Gender: Female  DOB: 1946/10/12  Age: 68 Years  PCP: Deborra Medina (Adults only)  Office Follow Up:  Does the office need to follow up with this patient?: No  Instructions For The Office: N/A  RN Note:  Multiple complaints but worst one is dizziness.  Using Vicodan or Ibuprofen for pain at bedtime. Seen at [facility] 02/21/14 for swollen glands and migraine.  Diagnosed with carpel tunnel and migraine.  Treated with Minocycline 100 mg BID for 10 days. Reports eye pressure and episodes of seeing spots with migraine.  Pulse 60 bpm. Ambulatory. Coherent responses.  Instructed to hydrate and eat something now.  Get up slowly to avoid falling.  Advised to see MD within 4 hours for dizziness present now after 2 hours of rest and fluids per Dizziness.  No appointments remain at [facility] office.  Scheduled for 1145 03/08/14 with Dr Deborra Medina at [facility]   Symptoms  Reason For Call & Symptoms: "Not feeling right" with tingling in both hands, lower leg  and back pain, swelling of eyes "like looking thru a tunnel" and feels spacy; "in another world."  Reviewed Health History In EMR: Yes  Reviewed Medications In EMR: Yes  Reviewed Allergies In EMR: Yes  Reviewed Surgeries / Procedures: Yes  Date of Onset of Symptoms: 02/15/2014  Treatments Tried: wears hand braces, Aspercream, Vicodan  Treatments Tried Worked: Yes  Guideline(s) Used:  Dizziness  Disposition Per Guideline:   Go to Office Now  Reason For Disposition Reached:   Lightheadedness (dizziness) present now, after 2 hours of rest and fluids  Advice Given:  N/A  Patient Will Follow Care Advice:  YES  Appointment Scheduled:  03/08/2014 11:45:00 Appointment Scheduled Provider:  Other

## 2014-03-08 NOTE — Assessment & Plan Note (Signed)
Does seem consistent with Carpal Tunnel. Discussed continuing wrist splints.  Talk to her orthopedist about cortisone injections.

## 2014-03-08 NOTE — Patient Instructions (Addendum)
It was nice to see you. I will call you with your xray results. Please keep your appointment with Dr. Derrel Nip tomorrow.

## 2014-03-08 NOTE — Progress Notes (Signed)
Subjective:   Patient ID: NIL Tamara [name], female    DOB: 03-05-1946, 68 y.o.   MRN: [id number]  Tamara [name] is a pleasant 68 y.o. year old female patient of Dr. Derrel Nip, new to me, who presents to clinic today with Numbness  on 03/08/2014 Dr. Lupita Dawn schedule was full and Call a Nurse felt she needed to be seen today for dizziness.  HPI: She has multiple concerns today.  She told Call a Nurse that her main complaint is dizziness but tell us that her main complaint is numbness in her hands bilaterally that has been ongoing for 3 weeks ago .  H/o cervical fusion- C6/7 in 2005.  Went to walk in clinic two weeks ago, given imitrex and minocylcin for sinus infection.  Finished minocylcin last week.  Was told she had carpal tunnel and did not have a stroke.  Wearing wrist splints at night and has not helping.  Taking Vicodin for pain.  No UE weakness.   Recently had lab work done- reviewed in [employer]. Vit D and Vit B12 only slightly abnormal.  Lab Results  Component Value Date   NA 140 01/26/2014   K 3.5 01/26/2014   CL 110 01/26/2014   CO2 23 01/26/2014   Lab Results  Component Value Date   WBC 10.1 01/26/2014   HGB 14.1 01/26/2014   HCT 42.4 01/26/2014   MCV 103.1* 01/26/2014   PLT 308.0 01/26/2014   Lab Results  Component Value Date   CREATININE 0.9 01/26/2014    Patient Active Problem List   Diagnosis Date Noted  . Bilateral hand numbness 03/08/2014  . Feeling weak 03/08/2014  . Cough 03/08/2014  . Tobacco abuse counseling 01/26/2014  . Unspecified vitamin D deficiency 01/26/2014  . B12 deficiency 01/26/2014  . Tobacco abuse 01/26/2014  . Routine general medical examination at a health care facility 05/11/2013  . Chest pain 05/11/2013  . Osteoporosis, unspecified 11/19/2011  . Chronic abdominal pain   . Bowel obstruction   . Crohn's disease   . History of DVT (deep vein thrombosis) 01/10/2007   Past Medical History  Diagnosis Date  . Osteoporosis   . Chronic abdominal  pain   . Crohn's disease   . History of DVT (deep vein thrombosis) 01/2007    post operative right leg, s/p vena cava filter  . Bowel obstruction     s/p laparotomy/ LOA 3/08, prior reversal of jejunal bypasss 2004   Past Surgical History  Procedure Laterality Date  . Cholecystectomy    . Cervical fusion  2005    C6-7, anterior approach  . 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 Rotator cuff repair      right  . Appendectomy    . Gyn surgery      hysterectomy  . Laparotomy  01/30/07    for bowel obstruction with LOA  . Cataract extraction    . Nissen fundoplication    . Abdominal hysterectomy     History  Substance Use Topics  . Smoking status: Current Every Day Smoker  . Smokeless tobacco: Not on file     Comment: half to one pack of cigs per day  . Alcohol Use: No   Family History  Problem Relation Age of Onset  . Diabetes Mother     type 2  . Heart disease Mother   . Hypertension Mother   . Coronary artery disease Father    Allergies  Allergen Reactions  .  Sulfa Antibiotics Rash   Current Outpatient Prescriptions on File Prior to Visit  Medication Sig Dispense Refill  . budesonide (ENTOCORT EC) 3 MG 24 hr capsule Take one by mouth three times daily       . clobetasol ointment (TEMOVATE) 0.05 % Apply topically 2 (two) times daily. To labia For two to three weeks,  Then reduce use to twice per week  30 g  1  . clonazePAM (KLONOPIN) 0.5 MG tablet Take 1 tablet (0.5 mg total) by mouth daily. As needed for anxiety  30 tablet  2  . cyanocobalamin (,VITAMIN B-12,) 1000 MCG/ML injection 1000 mcg IM injection weekly for 3 weeks,  Then continue to inject I ml IM monthly  10 mL  2  . ergocalciferol (DRISDOL) 50000 UNITS capsule Take 1 capsule (50,000 Units total) by mouth once a week.  12 capsule  0  . fluticasone (FLONASE) 50 MCG/ACT nasal spray 2 sprays in each nostril once daily  16 g  6  . mesalamine (PENTASA) 250 MG CR capsule Take 250 mg by mouth 3 (three)  times daily.        Marland Kitchen omeprazole (PRILOSEC) 20 MG capsule Take 2 by mouth daily       . promethazine (PHENERGAN) 25 MG tablet Take one by mouth as needed       . sertraline (ZOLOFT) 100 MG tablet Take 1 tablet (100 mg total) by mouth daily.  30 tablet  6  . spironolactone (ALDACTONE) 25 MG tablet Take 1 tablet (25 mg total) by mouth daily. Prn edema  30 tablet  3  . traZODone (DESYREL) 50 MG tablet Take 0.5-1 tablets (25-50 mg total) by mouth at bedtime as needed for sleep.  30 tablet  3  . zoledronic acid (RECLAST) 5 MG/100ML SOLN Inject 5 mg into the vein once. yearly        No current facility-administered medications on file prior to visit.   The PMH, PSH, Social History, Family History, Medications, and allergies have been reviewed in [facility], and have been updated if relevant.  Review of Systems    Appetite increased Wt Readings from Last 3 Encounters:  03/08/14 104 lb 8 oz (47.401 kg)  01/26/14 107 lb (48.535 kg)  05/11/13 105 lb 12 oz (47.968 kg)   +HA +dizziness +"feeling out of it." No CP or SOB +cough, no hemoptysis Objective:    BP 108/60  Pulse 73  Temp(Src) 97.8 F (36.6 C) (Oral)  Ht 5' 1.25" (1.556 m)  Wt 104 lb 8 oz (47.401 kg)  BMI 19.58 kg/m2  SpO2 96%   Physical Exam  Nursing note and vitals reviewed. Constitutional: She appears well-developed and well-nourished. No distress.  HENT:  Head: Normocephalic and atraumatic.  Right Ear: Tympanic membrane is retracted.  Nose: Rhinorrhea present. No mucosal edema or sinus tenderness. Right sinus exhibits no maxillary sinus tenderness and no frontal sinus tenderness. Left sinus exhibits no maxillary sinus tenderness and no frontal sinus tenderness.  Musculoskeletal:  +phalen  Psychiatric: She has a normal mood and affect. Her speech is normal and behavior is normal. Judgment and thought content normal. Cognition and memory are normal.          Assessment & Plan:   Bilateral hand numbness No Follow-up on  file.

## 2014-03-08 NOTE — Assessment & Plan Note (Signed)
With several other long term complaints.  We made an appointment with her PCP, Dr. Derrel Nip for tomorrow to discuss these concerns.

## 2014-03-08 NOTE — Assessment & Plan Note (Signed)
Lungs clear but given long term cough in smoker, will get CXR today.

## 2014-03-09 ENCOUNTER — Ambulatory Visit (INDEPENDENT_AMBULATORY_CARE_PROVIDER_SITE_OTHER): Payer: Medicare Other | Admitting: Internal Medicine

## 2014-03-09 ENCOUNTER — Encounter: Payer: Self-pay | Admitting: Internal Medicine

## 2014-03-09 VITALS — BP 98/58 | HR 80 | Temp 98.1°F | Wt 105.0 lb

## 2014-03-09 DIAGNOSIS — H539 Unspecified visual disturbance: Secondary | ICD-10-CM

## 2014-03-09 DIAGNOSIS — G8929 Other chronic pain: Secondary | ICD-10-CM

## 2014-03-09 DIAGNOSIS — R200 Anesthesia of skin: Secondary | ICD-10-CM

## 2014-03-09 DIAGNOSIS — R519 Headache, unspecified: Secondary | ICD-10-CM

## 2014-03-09 DIAGNOSIS — R51 Headache: Secondary | ICD-10-CM

## 2014-03-09 DIAGNOSIS — R209 Unspecified disturbances of skin sensation: Secondary | ICD-10-CM

## 2014-03-09 NOTE — Progress Notes (Signed)
Pre visit review using our clinic review tool, if applicable. No additional management support is needed unless otherwise documented below in the visit note. 

## 2014-03-09 NOTE — Progress Notes (Signed)
Patient ID: Tamara [name], female   DOB: 1946-05-16, 68 y.o.   MRN: [id number]  Patient Active Problem List   Diagnosis Date Noted  . Chronic headache 03/11/2014  . Bilateral hand numbness 03/08/2014  . Feeling weak 03/08/2014  . Cough 03/08/2014  . Tobacco abuse counseling 01/26/2014  . Unspecified vitamin D deficiency 01/26/2014  . B12 deficiency 01/26/2014  . Tobacco abuse 01/26/2014  . Routine general medical examination at a health care facility 05/11/2013  . Chest pain 05/11/2013  . Osteoporosis, unspecified 11/19/2011  . Chronic abdominal pain   . Bowel obstruction   . Crohn's disease   . History of DVT (deep vein thrombosis) 01/10/2007    Subjective:  CC:   Chief Complaint  Patient presents with  . Headache  . Dizziness  . fingers tingling    HPI:   Tamara [name] is a 68 y.o. female who presents for Multiple complaints. Seen yesterday at [facility],  Referred back here. "I wasted $20"   Recurrent  headaches followed by scotomas that get gradually larger with blurring of vision in left eye.  Occurring nearly daily For the past two months. Worse at night but still driving and has not had any accidents.    2) Sinus congestion.  Was treated pn March 18 for viral uri with prednisone AND  Nasal spray,  And she added  A 7 day course of amoxicilllin a few days later when symptoms did not improve.  Symptoms persisted so she went to  urgent care on April 11 and was treated with minocycline and imitrex.  Ears feel less congested and inflamed.   3) Went to [facility] yesterday for dizziness, loss of vision and tingling of fingers. Nothing was done, and she was sent back to me.   Very anxious,  Multiple caregiver issues (husband and mother)   Past Medical History  Diagnosis Date  . Osteoporosis   . Chronic abdominal pain   . Crohn's disease   . History of DVT (deep vein thrombosis) 01/2007    post operative right leg, s/p vena cava filter  . Bowel obstruction      s/p laparotomy/ LOA 3/08, prior reversal of jejunal bypasss 2004    Past Surgical History  Procedure Laterality Date  . Cholecystectomy    . Cervical fusion  2005    C6-7, anterior approach  . 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 Rotator cuff repair      right  . Appendectomy    . Gyn surgery      hysterectomy  . Laparotomy  01/30/07    for bowel obstruction with LOA  . Cataract extraction    . Nissen fundoplication    . Abdominal hysterectomy         The following portions of the patient's history were reviewed and updated as appropriate: Allergies, current medications, and problem list.    Review of Systems:   Patient denies headache, fevers, malaise, unintentional weight loss, skin rash, eye pain, sinus congestion and sinus pain, sore throat, dysphagia,  hemoptysis , cough, dyspnea, wheezing, chest pain, palpitations, orthopnea, edema, abdominal pain, nausea, melena, diarrhea, constipation, flank pain, dysuria, hematuria, urinary  Frequency, nocturia, numbness, tingling, seizures,  Focal weakness, Loss of consciousness,  Tremor, insomnia, depression, anxiety, and suicidal ideation.     History   Social History  . Marital Status: Married    Spouse Name: N/A    Number of Children: N/A  . Years  of Education: N/A   Occupational History  . Not on file.   Social History Main Topics  . Smoking status: Current Every Day Smoker  . Smokeless tobacco: Not on file     Comment: half to one pack of cigs per day  . Alcohol Use: No  . Drug Use: Not on file  . Sexual Activity: Not on file   Other Topics Concern  . Not on file   Social History Narrative   Lives with spouse.    Objective:  Filed Vitals:   03/09/14 1327  BP: 98/58  Pulse: 80  Temp: 98.1 F (36.7 C)     General appearance: alert, cooperative and appears stated age Ears: normal TM's and external ear canals both ears Throat: lips, mucosa, and tongue normal; teeth and gums  normal Neck: no adenopathy, no carotid bruit, supple, symmetrical, trachea midline and thyroid not enlarged, symmetric, no tenderness/mass/nodules Back: symmetric, no curvature. ROM normal. No CVA tenderness. Lungs: clear to auscultation bilaterally Heart: regular rate and rhythm, S1, S2 normal, no murmur, click, rub or gallop Abdomen: soft, non-tender; bowel sounds normal; no masses,  no organomegaly Pulses: 2+ and symmetric Skin: Skin color, texture, turgor normal. No rashes or lesions Lymph nodes: Cervical, supraclavicular, and axillary nodes normal.  Assessment and Plan:  Bilateral hand numbness Prior MD evaluation advised that her symptoms seemed consistent with Carpal Tunnel.and discussed continuing wrist splints and seeing her  orthopedist about cortisone injections. Will confirm with EMV nerve conduction studies since she also has a history of DDD cervical spine .     Chronic headache Accompanied by vision changes in left eye.  Neuro exam normal.  Since she is a smoker.  Need to rule out aneursym and mass.  MRI>MRA ordered.    Updated Medication List Outpatient Encounter Prescriptions as of 03/09/2014  Medication Sig  . budesonide (ENTOCORT EC) 3 MG 24 hr capsule Take one by mouth three times daily   . clobetasol ointment (TEMOVATE) 0.05 % Apply topically 2 (two) times daily. To labia For two to three weeks,  Then reduce use to twice per week  . clonazePAM (KLONOPIN) 0.5 MG tablet Take 1 tablet (0.5 mg total) by mouth daily. As needed for anxiety  . cyanocobalamin (,VITAMIN B-12,) 1000 MCG/ML injection 1000 mcg IM injection weekly for 3 weeks,  Then continue to inject I ml IM monthly  . ergocalciferol (DRISDOL) 50000 UNITS capsule Take 1 capsule (50,000 Units total) by mouth once a week.  . fluticasone (FLONASE) 50 MCG/ACT nasal spray 2 sprays in each nostril once daily  . mesalamine (PENTASA) 250 MG CR capsule Take 250 mg by mouth 3 (three) times daily.    Marland Kitchen omeprazole  (PRILOSEC) 20 MG capsule Take 2 by mouth daily   . promethazine (PHENERGAN) 25 MG tablet Take one by mouth as needed   . sertraline (ZOLOFT) 100 MG tablet Take 1 tablet (100 mg total) by mouth daily.  Marland Kitchen spironolactone (ALDACTONE) 25 MG tablet Take 1 tablet (25 mg total) by mouth daily. Prn edema  . SUMAtriptan (IMITREX) 25 MG tablet Take 25 mg by mouth every 2 (two) hours as needed for migraine or headache. May repeat in 2 hours if headache persists or recurs.  . traZODone (DESYREL) 50 MG tablet Take 0.5-1 tablets (25-50 mg total) by mouth at bedtime as needed for sleep.  Marland Kitchen zoledronic acid (RECLAST) 5 MG/100ML SOLN Inject 5 mg into the vein once. yearly      Orders Placed This Encounter  Procedures  . MR MRA Head/Brain Wo Cm  . Ambulatory referral to Neurology  . Ambulatory referral to Ophthalmology    Return in about 4 weeks (around 04/06/2014).

## 2014-03-09 NOTE — Patient Instructions (Signed)
MRI of brain to investigate you rheaacehs and left eye visual changes  Referral to [facility] for exam  Referral to Dr Manuella Ghazi for nerve conduction studies

## 2014-03-11 DIAGNOSIS — R519 Headache, unspecified: Secondary | ICD-10-CM | POA: Insufficient documentation

## 2014-03-11 DIAGNOSIS — G8929 Other chronic pain: Secondary | ICD-10-CM | POA: Insufficient documentation

## 2014-03-11 DIAGNOSIS — R51 Headache: Secondary | ICD-10-CM

## 2014-03-11 NOTE — Assessment & Plan Note (Signed)
Prior MD evaluation advised that her symptoms seemed consistent with Carpal Tunnel.and discussed continuing wrist splints and seeing her  orthopedist about cortisone injections. Will confirm with EMV nerve conduction studies since she also has a history of DDD cervical spine .

## 2014-03-11 NOTE — Assessment & Plan Note (Signed)
Accompanied by vision changes in left eye.  Neuro exam normal.  Since she is a smoker.  Need to rule out aneursym and mass.  MRI>MRA ordered.

## 2014-03-19 ENCOUNTER — Ambulatory Visit: Payer: Self-pay | Admitting: Internal Medicine

## 2014-03-21 ENCOUNTER — Ambulatory Visit: Payer: Self-pay | Admitting: Unknown Physician Specialty

## 2014-03-22 ENCOUNTER — Telehealth: Payer: Self-pay | Admitting: Internal Medicine

## 2014-03-22 LAB — PATHOLOGY REPORT

## 2014-03-22 NOTE — Telephone Encounter (Signed)
Patient informed and verbalized understanding. Already has an appointment scheduled for 5/29

## 2014-03-22 NOTE — Telephone Encounter (Signed)
MRI was negative for strokes and aneurysms and tumors,  But suggested she is at risk for cva due to small vessel disease.  Make appt to discuss

## 2014-03-23 ENCOUNTER — Encounter: Payer: Self-pay | Admitting: Internal Medicine

## 2014-04-05 ENCOUNTER — Other Ambulatory Visit: Payer: Self-pay | Admitting: Internal Medicine

## 2014-04-06 LAB — HM COLONOSCOPY

## 2014-04-06 NOTE — Telephone Encounter (Signed)
Last visit 03/09/14

## 2014-04-06 NOTE — Telephone Encounter (Signed)
Ok to refill,  printed rx  

## 2014-04-07 ENCOUNTER — Emergency Department: Payer: Self-pay | Admitting: Emergency Medicine

## 2014-04-08 ENCOUNTER — Encounter: Payer: Self-pay | Admitting: Internal Medicine

## 2014-04-08 ENCOUNTER — Encounter: Payer: Self-pay | Admitting: *Deleted

## 2014-04-08 ENCOUNTER — Ambulatory Visit (INDEPENDENT_AMBULATORY_CARE_PROVIDER_SITE_OTHER): Payer: Medicare Other | Admitting: Internal Medicine

## 2014-04-08 VITALS — BP 98/56 | HR 93 | Temp 98.2°F | Resp 18 | Ht 61.25 in | Wt 102.5 lb

## 2014-04-08 DIAGNOSIS — R519 Headache, unspecified: Secondary | ICD-10-CM

## 2014-04-08 DIAGNOSIS — E559 Vitamin D deficiency, unspecified: Secondary | ICD-10-CM

## 2014-04-08 DIAGNOSIS — E785 Hyperlipidemia, unspecified: Secondary | ICD-10-CM

## 2014-04-08 DIAGNOSIS — R200 Anesthesia of skin: Secondary | ICD-10-CM

## 2014-04-08 DIAGNOSIS — Z7189 Other specified counseling: Secondary | ICD-10-CM

## 2014-04-08 DIAGNOSIS — R51 Headache: Secondary | ICD-10-CM

## 2014-04-08 DIAGNOSIS — N760 Acute vaginitis: Secondary | ICD-10-CM

## 2014-04-08 DIAGNOSIS — F17200 Nicotine dependence, unspecified, uncomplicated: Secondary | ICD-10-CM

## 2014-04-08 DIAGNOSIS — Z716 Tobacco abuse counseling: Secondary | ICD-10-CM

## 2014-04-08 DIAGNOSIS — G8929 Other chronic pain: Secondary | ICD-10-CM

## 2014-04-08 DIAGNOSIS — G5600 Carpal tunnel syndrome, unspecified upper limb: Secondary | ICD-10-CM

## 2014-04-08 DIAGNOSIS — R209 Unspecified disturbances of skin sensation: Secondary | ICD-10-CM

## 2014-04-08 LAB — COMPREHENSIVE METABOLIC PANEL
ALBUMIN: 4 g/dL (ref 3.5–5.2)
ALK PHOS: 43 U/L (ref 39–117)
ALT: 24 U/L (ref 0–35)
AST: 22 U/L (ref 0–37)
BUN: 13 mg/dL (ref 6–23)
CHLORIDE: 106 meq/L (ref 96–112)
CO2: 25 mEq/L (ref 19–32)
CREATININE: 0.8 mg/dL (ref 0.4–1.2)
Calcium: 9.9 mg/dL (ref 8.4–10.5)
GFR: 78.43 mL/min (ref >60.00)
Glucose, Bld: 87 mg/dL (ref 70–99)
Potassium: 4.4 mEq/L (ref 3.5–5.1)
Sodium: 138 mEq/L (ref 135–145)
Total Bilirubin: 0.6 mg/dL (ref 0.2–1.2)
Total Protein: 6.2 g/dL (ref 6.0–8.3)

## 2014-04-08 LAB — LIPID PANEL
CHOLESTEROL: 204 mg/dL — AB (ref 0–200)
HDL: 65.6 mg/dL (ref >39.00)
LDL Cholesterol: 112 mg/dL — ABNORMAL HIGH (ref 0–99)
Total CHOL/HDL Ratio: 3
Triglycerides: 133 mg/dL (ref 0.0–149.0)
VLDL: 26.6 mg/dL (ref 0.0–40.0)

## 2014-04-08 MED ORDER — SERTRALINE HCL 100 MG PO TABS: 100.0000 mg | ORAL_TABLET | Freq: Every day | ORAL | Status: DC

## 2014-04-08 MED ORDER — HYDROCODONE-ACETAMINOPHEN 10-325 MG PO TABS
1.0000 | ORAL_TABLET | Freq: Three times a day (TID) | ORAL | Status: DC | PRN
Start: 2014-04-08 — End: 2015-02-22

## 2014-04-08 MED ORDER — PREDNISONE (PAK) 10 MG PO TABS: ORAL_TABLET | ORAL | Status: DC

## 2014-04-08 MED ORDER — ERGOCALCIFEROL 1.25 MG (50000 UT) PO CAPS: 50000.0000 [IU] | ORAL_CAPSULE | ORAL | Status: DC

## 2014-04-08 MED ORDER — CLONAZEPAM 0.5 MG PO TABS: ORAL_TABLET | ORAL | Status: DC

## 2014-04-08 MED ORDER — TRAZODONE HCL 50 MG PO TABS: 25.0000 mg | ORAL_TABLET | Freq: Every evening | ORAL | Status: DC | PRN

## 2014-04-08 NOTE — Patient Instructions (Signed)
12 Day prednisone taper for  Your carpal tunnel  I have given you a 30 day supply of ES vicodin to use 3 times daily   Referral to Neurosurgery pending receipt of Dr Lannie Fields notes

## 2014-04-08 NOTE — Assessment & Plan Note (Addendum)
Smoking cessation instruction/counseling given:  counseled patient on the dangers of tobacco use, advised patient to stop smoking, and reviewed strategies to maximize success 

## 2014-04-08 NOTE — Progress Notes (Addendum)
Patient ID: Tamara [name], female   DOB: Feb 17, 1946, 68 y.o.   MRN: [id number]  Patient Active Problem List   Diagnosis Date Noted  . Chronic headache 03/11/2014  . Bilateral hand numbness 03/08/2014  . Feeling weak 03/08/2014  . Cough 03/08/2014  . Tobacco abuse counseling 01/26/2014  . Unspecified vitamin D deficiency 01/26/2014  . B12 deficiency 01/26/2014  . Tobacco abuse 01/26/2014  . Routine general medical examination at a health care facility 05/11/2013  . Chest pain 05/11/2013  . Osteoporosis, unspecified 11/19/2011  . Chronic abdominal pain   . Bowel obstruction   . Crohn's disease   . History of DVT (deep vein thrombosis) 01/10/2007    Subjective:  CC:   Chief Complaint  Patient presents with  . Follow-up    headaches , MRI .    HPI:   Tamara [name] is a 68 y.o. female who presents for  Follow up on chronic conditions, including fatigue,  Chronic headache and abdominal pain,  Anxiety  And wrist pain.   Past Medical History  Diagnosis Date  . Osteoporosis   . Chronic abdominal pain   . Crohn's disease   . History of DVT (deep vein thrombosis) 01/2007    post operative right leg, s/p vena cava filter  . Bowel obstruction     s/p laparotomy/ LOA 3/08, prior reversal of jejunal bypasss 2004    Past Surgical History  Procedure Laterality Date  . Cholecystectomy    . Cervical fusion  2005    C6-7, anterior approach  . 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 Rotator cuff repair      right  . Appendectomy    . Gyn surgery      hysterectomy  . Laparotomy  01/30/07    for bowel obstruction with LOA  . Cataract extraction    . Nissen fundoplication    . Abdominal hysterectomy         The following portions of the patient's history were reviewed and updated as appropriate: Allergies, current medications, and problem list.    Review of Systems:   Patient denies headache, fevers, malaise, unintentional weight loss, skin rash, eye  pain, sinus congestion and sinus pain, sore throat, dysphagia,  hemoptysis , cough, dyspnea, wheezing, chest pain, palpitations, orthopnea, edema, abdominal pain, nausea, melena, diarrhea, constipation, flank pain, dysuria, hematuria, urinary  Frequency, nocturia, numbness, tingling, seizures,  Focal weakness, Loss of consciousness,  Tremor, insomnia, depression, anxiety, and suicidal ideation.     History   Social History  . Marital Status: Married    Spouse Name: N/A    Number of Children: N/A  . Years of Education: N/A   Occupational History  . Not on file.   Social History Main Topics  . Smoking status: Current Every Day Smoker  . Smokeless tobacco: Not on file     Comment: half to one pack of cigs per day  . Alcohol Use: No  . Drug Use: Not on file  . Sexual Activity: Not on file   Other Topics Concern  . Not on file   Social History Narrative   Lives with spouse.    Objective:  Filed Vitals:   04/08/14 0944  BP: 98/56  Pulse: 93  Temp: 98.2 F (36.8 C)  Resp: 18     General appearance: alert, cooperative and appears stated age Ears: normal TM's and external ear canals both ears Throat: lips, mucosa, and tongue normal; teeth and  gums normal Neck: no adenopathy, no carotid bruit, supple, symmetrical, trachea midline and thyroid not enlarged, symmetric, no tenderness/mass/nodules Back: symmetric, no curvature. ROM normal. No CVA tenderness. Lungs: clear to auscultation bilaterally Heart: regular rate and rhythm, S1, S2 normal, no murmur, click, rub or gallop Abdomen: soft, non-tender; bowel sounds normal; no masses,  no organomegaly Pulses: 2+ and symmetric Skin: Skin color, texture, turgor normal. No rashes or lesions Lymph nodes: Cervical, supraclavicular, and axillary nodes normal.  Assessment and Plan:  Tobacco abuse counseling Smoking cessation instruction/counseling given:  counseled patient on the dangers of tobacco use, advised patient to stop  smoking, and reviewed strategies to maximize success  Bilateral hand numbness Diagnosed with CTS by neurology .  Pain is not controlled currenlty on vicodin and in pain constantly.  Dose increased and prednisone taper added.  Referral to Neurosurgery  For surgical opinion   Chronic headache MRi was done to  Rule out mass and showed only small vessel ischemia.  Advised to quit smoking.  Fasting lipids were normal.    Updated Medication List Outpatient Encounter Prescriptions as of 04/08/2014  Medication Sig  . budesonide (ENTOCORT EC) 3 MG 24 hr capsule Take one by mouth three times daily   . clobetasol ointment (TEMOVATE) 0.05 % Apply topically 2 (two) times daily. To labia For two to three weeks,  Then reduce use to twice per week  . clonazePAM (KLONOPIN) 0.5 MG tablet TAKE 1 TABLET BY MOUTH ONCE DAILY AS NEEDED FOR ANXIETY.  . cyanocobalamin (,VITAMIN B-12,) 1000 MCG/ML injection 1000 mcg IM injection weekly for 3 weeks,  Then continue to inject I ml IM monthly  . ergocalciferol (DRISDOL) 50000 UNITS capsule Take 1 capsule (50,000 Units total) by mouth once a week.  . fluticasone (FLONASE) 50 MCG/ACT nasal spray 2 sprays in each nostril once daily  . mesalamine (PENTASA) 250 MG CR capsule Take 250 mg by mouth 3 (three) times daily.    . nortriptyline (PAMELOR) 10 MG capsule Take 2 capsules by mouth at bedtime.  Marland Kitchen omeprazole (PRILOSEC) 20 MG capsule Take 2 by mouth daily   . promethazine (PHENERGAN) 25 MG tablet Take one by mouth as needed   . sertraline (ZOLOFT) 100 MG tablet Take 1 tablet (100 mg total) by mouth daily.  Marland Kitchen spironolactone (ALDACTONE) 25 MG tablet Take 1 tablet (25 mg total) by mouth daily. Prn edema  . SUMAtriptan (IMITREX) 25 MG tablet Take 25 mg by mouth every 2 (two) hours as needed for migraine or headache. May repeat in 2 hours if headache persists or recurs.  . traZODone (DESYREL) 50 MG tablet Take 0.5-1 tablets (25-50 mg total) by mouth at bedtime as needed for sleep.   Marland Kitchen zoledronic acid (RECLAST) 5 MG/100ML SOLN Inject 5 mg into the vein once. yearly   . [DISCONTINUED] clonazePAM (KLONOPIN) 0.5 MG tablet TAKE 1 TABLET BY MOUTH ONCE DAILY AS NEEDED FOR ANXIETY.  . [DISCONTINUED] ergocalciferol (DRISDOL) 50000 UNITS capsule Take 1 capsule (50,000 Units total) by mouth once a week.  . [DISCONTINUED] sertraline (ZOLOFT) 100 MG tablet Take 1 tablet (100 mg total) by mouth daily.  . [DISCONTINUED] traZODone (DESYREL) 50 MG tablet Take 0.5-1 tablets (25-50 mg total) by mouth at bedtime as needed for sleep.  Marland Kitchen HYDROcodone-acetaminophen (NORCO) 10-325 MG per tablet Take 1 tablet by mouth every 8 (eight) hours as needed.  . predniSONE (STERAPRED UNI-PAK) 10 MG tablet 6 tablets on Days 1 and 2 , then reduce by 1 tablet every 2 days  until gone

## 2014-04-08 NOTE — Progress Notes (Signed)
Pre-visit discussion using our clinic review tool. No additional management support is needed unless otherwise documented below in the visit note.  

## 2014-04-09 LAB — VITAMIN D 25 HYDROXY (VIT D DEFICIENCY, FRACTURES): Vit D, 25-Hydroxy: 61 ng/mL (ref 30–89)

## 2014-04-10 ENCOUNTER — Encounter: Payer: Self-pay | Admitting: Internal Medicine

## 2014-04-10 NOTE — Assessment & Plan Note (Signed)
Diagnosed with CTS by neurology .  Pain is not controlled currenlty on vicodin and in pain constantly.  Dose increased and prednisone taper added.  Referral to Neurosurgery  For surgical opinion

## 2014-04-10 NOTE — Assessment & Plan Note (Addendum)
MRi was done to  Rule out mass and showed only small vessel ischemia.  Advised to quit smoking.  Fasting lipids were normal.  Avoid triptans .

## 2014-04-11 ENCOUNTER — Encounter: Payer: Self-pay | Admitting: *Deleted

## 2014-04-11 ENCOUNTER — Telehealth: Payer: Self-pay | Admitting: Internal Medicine

## 2014-04-11 DIAGNOSIS — R519 Headache, unspecified: Secondary | ICD-10-CM

## 2014-04-11 DIAGNOSIS — G8929 Other chronic pain: Secondary | ICD-10-CM

## 2014-04-11 DIAGNOSIS — R51 Headache: Principal | ICD-10-CM

## 2014-04-13 ENCOUNTER — Encounter: Payer: Self-pay | Admitting: Internal Medicine

## 2014-04-22 HISTORY — PX: CARPAL TUNNEL RELEASE: SHX101

## 2014-04-26 ENCOUNTER — Telehealth: Payer: Self-pay | Admitting: Internal Medicine

## 2014-04-26 MED ORDER — TRAZODONE HCL 50 MG PO TABS
25.0000 mg | ORAL_TABLET | Freq: Every evening | ORAL | Status: DC | PRN
Start: 2014-04-26 — End: 2014-10-24

## 2014-04-26 NOTE — Telephone Encounter (Signed)
Patient requesting refill on trazodone ok to fill?

## 2014-04-26 NOTE — Telephone Encounter (Signed)
Ok to refill,  Authorized in epic 

## 2014-05-09 ENCOUNTER — Ambulatory Visit: Payer: Medicare Other | Admitting: Internal Medicine

## 2014-05-17 ENCOUNTER — Encounter: Payer: Self-pay | Admitting: Internal Medicine

## 2014-06-06 ENCOUNTER — Ambulatory Visit (INDEPENDENT_AMBULATORY_CARE_PROVIDER_SITE_OTHER): Payer: Medicare Other | Admitting: Internal Medicine

## 2014-06-06 ENCOUNTER — Encounter: Payer: Self-pay | Admitting: Internal Medicine

## 2014-06-06 VITALS — BP 98/60 | HR 75 | Temp 98.2°F | Resp 16 | Ht 61.25 in | Wt 101.8 lb

## 2014-06-06 DIAGNOSIS — G5603 Carpal tunnel syndrome, bilateral upper limbs: Secondary | ICD-10-CM

## 2014-06-06 DIAGNOSIS — M810 Age-related osteoporosis without current pathological fracture: Secondary | ICD-10-CM

## 2014-06-06 DIAGNOSIS — G44229 Chronic tension-type headache, not intractable: Secondary | ICD-10-CM

## 2014-06-06 DIAGNOSIS — G5600 Carpal tunnel syndrome, unspecified upper limb: Secondary | ICD-10-CM

## 2014-06-06 NOTE — Progress Notes (Signed)
Pre-visit discussion using our clinic review tool. No additional management support is needed unless otherwise documented below in the visit note.  

## 2014-06-06 NOTE — Progress Notes (Addendum)
Patient ID: Tamara [name], female   DOB: 04-23-1946, 68 y.o.   MRN: [id number]  Had bilateral CTS sequentially by Fransisco Beau. Had the right one done on June 10,   the left July 6th,  Was done in the office to save her $$$,  He has mowed the grass and used the weedeater since then Her wrist pain has improved  But she is not  pain free yet.   Has reduced her tobacco consumption to less than half pack daily since MRA showed small vessel disease and noncritical stenosis of the right vertebral artery.   Chronic headaches:  Dr Melrose Nakayama has prescribed pamelor  At night for headache prevention.  She has  reduced pamelor dose and headache are returning.  Discussed need to avoid using imitrex since triptans may be relatively c/i in small  vessel ischemic disease seen on brain MRA in May

## 2014-06-08 NOTE — Assessment & Plan Note (Addendum)
Encouraged to resume higher dose of pamelor , quit smoking , and avoid use of triptans given chronic small vessel ischemic changes noted on MRI

## 2014-06-08 NOTE — Assessment & Plan Note (Signed)
Secondary to Crohn's disease and use of steroids.  Continue Reclast by Dr . Jefm Bryant

## 2014-06-08 NOTE — Assessment & Plan Note (Signed)
S/p CT release sugery by Adalberto Cole. Last month.  Doing well post operatively

## 2014-06-08 NOTE — Progress Notes (Signed)
Patient ID: Tamara [name], female   DOB: [birth year], 68 y.o.   MRN: [id number]   Patient Active Problem List   Diagnosis Date Noted  . Chronic headache 03/11/2014  . Carpal tunnel syndrome 03/08/2014  . Feeling weak 03/08/2014  . Cough 03/08/2014  . Tobacco abuse counseling 01/26/2014  . B12 deficiency 01/26/2014  . Tobacco abuse 01/26/2014  . Routine general medical examination at a health care facility 05/11/2013  . Chest pain 05/11/2013  . Osteoporosis, unspecified 11/19/2011  . Chronic abdominal pain   . Bowel obstruction   . Crohn's disease   . History of DVT (deep vein thrombosis) 01/10/2007    Subjective:  CC:   Chief Complaint  Patient presents with  . Follow-up    CT and Hyperlipidemia    HPI:   Tamara [name] is a 68 y.o. female who presents for One month follow up on multiple issues.  Carotid dopplers were done as well as a CT angiogram of carotids which identified noncritical stenosis. She is still smoking cigarettes daily .  Heaache has improved but are still recurrent .  Had carpal tunnel surgery , bilateral by Dr Rowland Lathe. Recently ,  Has already returned to using  Lawnmower. Some pain in left volar zurface of wrist   r   Past Medical History  Diagnosis Date  . Osteoporosis   . Chronic abdominal pain   . Crohn's disease   . History of DVT (deep vein thrombosis) 01/2007    post operative right leg, s/p vena cava filter  . Bowel obstruction     s/p laparotomy/ LOA 3/08, prior reversal of jejunal bypasss 2004    Past Surgical History  Procedure Laterality Date  . Cholecystectomy    . Cervical fusion  2005    C6-7, anterior approach  . 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 Rotator cuff repair      right  . Appendectomy    . Gyn surgery      hysterectomy  . Laparotomy  01/30/07    for bowel obstruction with LOA  . Cataract extraction    . Nissen fundoplication    . Abdominal hysterectomy         The following portions of the  patient's history were reviewed and updated as appropriate: Allergies, current medications, and problem list.    Review of Systems:   Patient denies headache, fevers, malaise, unintentional weight loss, skin rash, eye pain, sinus congestion and sinus pain, sore throat, dysphagia,  hemoptysis , cough, dyspnea, wheezing, chest pain, palpitations, orthopnea, edema, abdominal pain, nausea, melena, diarrhea, constipation, flank pain, dysuria, hematuria, urinary  Frequency, nocturia, numbness, tingling, seizures,  Focal weakness, Loss of consciousness,  Tremor, insomnia, depression, anxiety, and suicidal ideation.     History   Social History  . Marital Status: Married    Spouse Name: N/A    Number of Children: N/A  . Years of Education: N/A   Occupational History  . Not on file.   Social History Main Topics  . Smoking status: Current Every Day Smoker  . Smokeless tobacco: Not on file     Comment: half to one pack of cigs per day  . Alcohol Use: No  . Drug Use: Not on file  . Sexual Activity: Not on file   Other Topics Concern  . Not on file   Social History Narrative   Lives with spouse.    Objective:  Filed Vitals:   06/06/14 1627  BP: 98/60  Pulse: 75  Temp: 98.2 F (36.8 C)  Resp: 16     General appearance: alert, cooperative and appears stated age Ears: normal TM's and external ear canals both ears Throat: lips, mucosa, and tongue normal; teeth and gums normal Neck: no adenopathy, no carotid bruit, supple, symmetrical, trachea midline and thyroid not enlarged, symmetric, no tenderness/mass/nodules Back: symmetric, no curvature. ROM normal. No CVA tenderness. Lungs: clear to auscultation bilaterally Heart: regular rate and rhythm, S1, S2 normal, no murmur, click, rub or gallop Abdomen: soft, non-tender; bowel sounds normal; no masses,  no organomegaly Pulses: 2+ and symmetric Skin: healing surgical incision both wrists/palms. No rashes or lesions Lymph nodes:  Cervical, supraclavicular, and axillary nodes normal.  Assessment and Plan:  Chronic headache Encouraged to resume higher dose of pamelor , quit smoking , and avoid use of triptans given chronic small vessel ischemic changes noted on MRI  Carpal tunnel syndrome S/p CT release sugery by Micheal Minz. Last month.  Doing well post operatively    Osteoporosis, unspecified Secondary to Crohn's disease and use of steroids.  Continue Reclast by Dr . Jefm Bryant     Updated Medication List Outpatient Encounter Prescriptions as of 06/06/2014  Medication Sig  . budesonide (ENTOCORT EC) 3 MG 24 hr capsule Take one by mouth three times daily   . clonazePAM (KLONOPIN) 0.5 MG tablet TAKE 1 TABLET BY MOUTH ONCE DAILY AS NEEDED FOR ANXIETY.  . cyanocobalamin (,VITAMIN B-12,) 1000 MCG/ML injection 1000 mcg IM injection weekly for 3 weeks,  Then continue to inject I ml IM monthly  . ergocalciferol (DRISDOL) 50000 UNITS capsule Take 1 capsule (50,000 Units total) by mouth once a week.  . fluticasone (FLONASE) 50 MCG/ACT nasal spray 2 sprays in each nostril once daily  . HYDROcodone-acetaminophen (NORCO) 10-325 MG per tablet Take 1 tablet by mouth every 8 (eight) hours as needed.  . mesalamine (PENTASA) 250 MG CR capsule Take 250 mg by mouth 3 (three) times daily.    . nortriptyline (PAMELOR) 10 MG capsule Take 2 capsules by mouth at bedtime.  Marland Kitchen omeprazole (PRILOSEC) 20 MG capsule Take 2 by mouth daily   . promethazine (PHENERGAN) 25 MG tablet Take one by mouth as needed   . sertraline (ZOLOFT) 100 MG tablet Take 1 tablet (100 mg total) by mouth daily.  Marland Kitchen spironolactone (ALDACTONE) 25 MG tablet Take 1 tablet (25 mg total) by mouth daily. Prn edema  . SUMAtriptan (IMITREX) 25 MG tablet Take 25 mg by mouth every 2 (two) hours as needed for migraine or headache. May repeat in 2 hours if headache persists or recurs.  . traZODone (DESYREL) 50 MG tablet Take 0.5-1 tablets (25-50 mg total) by mouth at bedtime as  needed for sleep.  Marland Kitchen zoledronic acid (RECLAST) 5 MG/100ML SOLN Inject 5 mg into the vein once. yearly   . clobetasol ointment (TEMOVATE) 0.05 % Apply topically 2 (two) times daily. To labia For two to three weeks,  Then reduce use to twice per week  . predniSONE (STERAPRED UNI-PAK) 10 MG tablet 6 tablets on Days 1 and 2 , then reduce by 1 tablet every 2 days  until gone     Orders Placed This Encounter  Procedures  . HM COLONOSCOPY    No Follow-up on file.

## 2014-07-06 ENCOUNTER — Other Ambulatory Visit: Payer: Self-pay | Admitting: Internal Medicine

## 2014-07-06 NOTE — Telephone Encounter (Signed)
Ok to refill,  printed rx  

## 2014-07-06 NOTE — Telephone Encounter (Signed)
Last refill 7.27.15, last OV 7.27.15, next OV 10.29.15.  Please advise refill

## 2014-09-07 ENCOUNTER — Ambulatory Visit: Payer: Medicare Other | Admitting: Internal Medicine

## 2014-09-08 ENCOUNTER — Encounter: Payer: Self-pay | Admitting: Internal Medicine

## 2014-09-08 ENCOUNTER — Ambulatory Visit (INDEPENDENT_AMBULATORY_CARE_PROVIDER_SITE_OTHER): Payer: Medicare Other | Admitting: Internal Medicine

## 2014-09-08 VITALS — BP 102/64 | HR 84 | Temp 98.3°F | Resp 16 | Ht 61.25 in | Wt 101.5 lb

## 2014-09-08 DIAGNOSIS — G43719 Chronic migraine without aura, intractable, without status migrainosus: Secondary | ICD-10-CM

## 2014-09-08 DIAGNOSIS — G44229 Chronic tension-type headache, not intractable: Secondary | ICD-10-CM

## 2014-09-08 DIAGNOSIS — E538 Deficiency of other specified B group vitamins: Secondary | ICD-10-CM

## 2014-09-08 DIAGNOSIS — Z716 Tobacco abuse counseling: Secondary | ICD-10-CM

## 2014-09-08 DIAGNOSIS — Z23 Encounter for immunization: Secondary | ICD-10-CM

## 2014-09-08 MED ORDER — CYANOCOBALAMIN 1000 MCG/ML IJ SOLN: INTRAMUSCULAR | Status: DC

## 2014-09-08 MED ORDER — SYRINGE (DISPOSABLE) 3 ML MISC: Status: DC

## 2014-09-08 MED ORDER — NORTRIPTYLINE HCL 25 MG PO CAPS: 25.0000 mg | ORAL_CAPSULE | Freq: Every day | ORAL | Status: DC

## 2014-09-08 NOTE — Progress Notes (Signed)
Pre-visit discussion using our clinic review tool. No additional management support is needed unless otherwise documented below in the visit note.  

## 2014-09-08 NOTE — Progress Notes (Signed)
Patient ID: Tamara [name], female   DOB: December 27, 1945, 68 y.o.   MRN: [id number]   Patient Active Problem List   Diagnosis Date Noted  . Headache, chronic migraine without aura 09/10/2014  . Chronic headache 03/11/2014  . Carpal tunnel syndrome 03/08/2014  . Feeling weak 03/08/2014  . Cough 03/08/2014  . Tobacco abuse counseling 01/26/2014  . B12 deficiency 01/26/2014  . Tobacco abuse 01/26/2014  . Routine general medical examination at a health care facility 05/11/2013  . Chest pain 05/11/2013  . Osteoporosis, unspecified 11/19/2011  . Chronic abdominal pain   . H/O small bowel obstruction   . Crohn's disease   . History of DVT (deep vein thrombosis) 01/10/2007    Subjective:  CC:   Chief Complaint  Patient presents with  . Follow-up    Having headaches.    HPI:   Tamara [name] is a 68 y.o. female who presents for Follow up on chronic conditions including  B12 deficiency,  chronic headaches and tobacco abuse.  Her migraines occurring 2-3 times per week,  Despite seeing neurologist Dr. Melrose Nakayama,  Who has started her on Nortriptyline  20 mg qhs for the past several months   Not taking b12 for unclear reasons  Husband has dementia ,  She is not getting any sleep  Because of his nighttime activities and needs.    Past Medical History  Diagnosis Date  . Osteoporosis   . Chronic abdominal pain   . Crohn's disease   . History of DVT (deep vein thrombosis) 01/2007    post operative right leg, s/p vena cava filter  . Bowel obstruction     s/p laparotomy/ LOA 3/08, prior reversal of jejunal bypasss 2004    Past Surgical History  Procedure Laterality Date  . Cholecystectomy    . Cervical fusion  2005    C6-7, anterior approach  . 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 Rotator cuff repair      right  . Appendectomy    . Gyn surgery      hysterectomy  . Laparotomy  01/30/07    for bowel obstruction with LOA  . Cataract extraction    . Nissen  fundoplication    . Abdominal hysterectomy         The following portions of the patient's history were reviewed and updated as appropriate: Allergies, current medications, and problem list.    Review of Systems:   Patient denies headache, fevers, malaise, unintentional weight loss, skin rash, eye pain, sinus congestion and sinus pain, sore throat, dysphagia,  hemoptysis , cough, dyspnea, wheezing, chest pain, palpitations, orthopnea, edema, abdominal pain, nausea, melena, diarrhea, constipation, flank pain, dysuria, hematuria, urinary  Frequency, nocturia, numbness, tingling, seizures,  Focal weakness, Loss of consciousness,  Tremor, insomnia, depression, anxiety, and suicidal ideation.     History   Social History  . Marital Status: Married    Spouse Name: N/A    Number of Children: N/A  . Years of Education: N/A   Occupational History  . Not on file.   Social History Main Topics  . Smoking status: Current Every Day Smoker  . Smokeless tobacco: Not on file     Comment: half to one pack of cigs per day  . Alcohol Use: No  . Drug Use: Not on file  . Sexual Activity: Not on file   Other Topics Concern  . Not on file   Social History Narrative   Lives with  spouse.    Objective:  Filed Vitals:   09/08/14 0802  BP: 102/64  Pulse: 84  Temp: 98.3 F (36.8 C)  Resp: 16     General appearance: alert, cooperative and appears stated age Neck: no adenopathy, no carotid bruit, supple, symmetrical, trachea midline and thyroid not enlarged, symmetric, no tenderness/mass/nodules Back: symmetric, no curvature. ROM normal. No CVA tenderness. Lungs: clear to auscultation bilaterally Heart: regular rate and rhythm, S1, S2 normal, no murmur, click, rub or gallop Abdomen: soft, non-tender; bowel sounds normal; no masses,  no organomegaly Pulses: 2+ and symmetric Neuro: CNs 2-12 intact. DTRs 2+/4 in biceps, brachioradialis, patellars and achilles. Muscle strength 5/5 in upper  and lower exremities. Fine resting tremor bilaterally both hands cerebellar function normal. Romberg negative.  No pronator drift.   Gait normal.   Assessment and Plan:  Headache, chronic migraine without aura Workup for vascular cause done after last visit,  Advised her to increase pamelor to 25 mg DAILY.   B12 deficiency Secondary to Crohn's disease and malabsorption  .  Refills given   Lab Results  Component Value Date   [id number] 263 01/26/2014     Chronic headache Neurology eval with Lyndel Pleasure May 2015: Carotid dopplers:  No significant stenosis . MRA: stenosis of  right vertebral artery , nondominant.  Chronic small vessel ischemic changes.  Urged to quit smoking,  Increase Pamelor    Tobacco abuse counseling Smoking cessation instruction/counseling given:  counseled patient on the dangers of tobacco use, advised patient to stop smoking, and reviewed strategies to maximize success   Updated Medication List Outpatient Encounter Prescriptions as of 09/08/2014  Medication Sig  . budesonide (ENTOCORT EC) 3 MG 24 hr capsule Take one by mouth three times daily   . clobetasol ointment (TEMOVATE) 0.05 % Apply topically 2 (two) times daily. To labia For two to three weeks,  Then reduce use to twice per week  . clonazePAM (KLONOPIN) 0.5 MG tablet TAKE 1 TABLET BY MOUTH ONCE DAILY AS NEEDED FOR ANXIETY.  . cyanocobalamin (,VITAMIN B-12,) 1000 MCG/ML injection 1000 mcg IM injection weekly for 3 weeks,  Then continue to inject I ml IM monthly  . ergocalciferol (DRISDOL) 50000 UNITS capsule Take 1 capsule (50,000 Units total) by mouth once a week.  . fluticasone (FLONASE) 50 MCG/ACT nasal spray 2 sprays in each nostril once daily  . HYDROcodone-acetaminophen (NORCO) 10-325 MG per tablet Take 1 tablet by mouth every 8 (eight) hours as needed.  . mesalamine (PENTASA) 250 MG CR capsule Take 250 mg by mouth 3 (three) times daily.    . nortriptyline (PAMELOR) 25 MG capsule Take 1 capsule (25  mg total) by mouth at bedtime.  Marland Kitchen omeprazole (PRILOSEC) 20 MG capsule Take 2 by mouth daily   . promethazine (PHENERGAN) 25 MG tablet Take one by mouth as needed   . sertraline (ZOLOFT) 100 MG tablet Take 1 tablet (100 mg total) by mouth daily.  Marland Kitchen spironolactone (ALDACTONE) 25 MG tablet Take 1 tablet (25 mg total) by mouth daily. Prn edema  . traZODone (DESYREL) 50 MG tablet Take 0.5-1 tablets (25-50 mg total) by mouth at bedtime as needed for sleep.  Marland Kitchen zoledronic acid (RECLAST) 5 MG/100ML SOLN Inject 5 mg into the vein once. yearly   . [DISCONTINUED] cyanocobalamin (,VITAMIN B-12,) 1000 MCG/ML injection 1000 mcg IM injection weekly for 3 weeks,  Then continue to inject I ml IM monthly  . [DISCONTINUED] nortriptyline (PAMELOR) 10 MG capsule Take 2 capsules by mouth at  bedtime.  . Syringe, Disposable, 3 ML MISC For use with B12 injections weekly/monthly  . [DISCONTINUED] predniSONE (STERAPRED UNI-PAK) 10 MG tablet 6 tablets on Days 1 and 2 , then reduce by 1 tablet every 2 days  until gone  . [DISCONTINUED] SUMAtriptan (IMITREX) 25 MG tablet Take 25 mg by mouth every 2 (two) hours as needed for migraine or headache. May repeat in 2 hours if headache persists or recurs.     Orders Placed This Encounter  Procedures  . Pneumococcal conjugate vaccine 13-valent    Return in about 3 months (around 12/09/2014).

## 2014-09-08 NOTE — Patient Instructions (Signed)
I have increased the nortriptylin to 25 mg at bedtime to help reduce the recurrence of your migraine headaches  Please contact me before the end of the one month supply to see if we need to increase it again   Return for fasting labs in late November   Please resume your B12 injections,  Weekly for 3 weeks, the monthly

## 2014-09-09 ENCOUNTER — Telehealth: Payer: Self-pay | Admitting: Internal Medicine

## 2014-09-09 NOTE — Telephone Encounter (Signed)
emmi emailed °

## 2014-09-10 ENCOUNTER — Encounter: Payer: Self-pay | Admitting: Internal Medicine

## 2014-09-10 DIAGNOSIS — G44309 Post-traumatic headache, unspecified, not intractable: Secondary | ICD-10-CM | POA: Insufficient documentation

## 2014-09-10 NOTE — Assessment & Plan Note (Signed)
Secondary to Crohn's disease and malabsorption  .  Refills given   Lab Results  Component Value Date   [id number] 263 01/26/2014

## 2014-09-10 NOTE — Assessment & Plan Note (Signed)
Neurology eval with Lyndel Pleasure May 2015: Carotid dopplers:  No significant stenosis . MRA: stenosis of  right vertebral artery , nondominant.  Chronic small vessel ischemic changes.  Urged to quit smoking,  Increase Pamelor

## 2014-09-10 NOTE — Assessment & Plan Note (Signed)
Workup for vascular cause done after last visit,  Advised her to increase pamelor to 25 mg DAILY.

## 2014-09-10 NOTE — Assessment & Plan Note (Signed)
Smoking cessation instruction/counseling given:  counseled patient on the dangers of tobacco use, advised patient to stop smoking, and reviewed strategies to maximize success 

## 2014-09-14 ENCOUNTER — Encounter: Payer: Self-pay | Admitting: Internal Medicine

## 2014-10-05 ENCOUNTER — Telehealth: Payer: Self-pay | Admitting: *Deleted

## 2014-10-05 NOTE — Telephone Encounter (Signed)
Pt is coming in Monday what labs and dx?

## 2014-10-07 ENCOUNTER — Emergency Department: Payer: Self-pay | Admitting: Student

## 2014-10-07 LAB — CBC
HCT: 41.5 % (ref 35.0–47.0)
HGB: 13.6 g/dL (ref 12.0–16.0)
MCH: 34.2 pg — ABNORMAL HIGH (ref 26.0–34.0)
MCHC: 32.9 g/dL (ref 32.0–36.0)
MCV: 104 fL — ABNORMAL HIGH (ref 80–100)
PLATELETS: 274 10*3/uL (ref 150–440)
RBC: 4 10*6/uL (ref 3.80–5.20)
RDW: 13 % (ref 11.5–14.5)
WBC: 14.6 10*3/uL — ABNORMAL HIGH (ref 3.6–11.0)

## 2014-10-07 LAB — BASIC METABOLIC PANEL
Anion Gap: 6 — ABNORMAL LOW (ref 7–16)
BUN: 12 mg/dL (ref 7–18)
CREATININE: 0.76 mg/dL (ref 0.60–1.30)
Calcium, Total: 8.5 mg/dL (ref 8.5–10.1)
Chloride: 102 mmol/L (ref 98–107)
Co2: 25 mmol/L (ref 21–32)
EGFR (African American): >60
GLUCOSE: 133 mg/dL — AB (ref 65–99)
Osmolality: 268 (ref 275–301)
POTASSIUM: 3.8 mmol/L (ref 3.5–5.1)
SODIUM: 133 mmol/L — AB (ref 136–145)

## 2014-10-07 LAB — D-DIMER(ARMC): D-Dimer: 350 ng/ml

## 2014-10-07 LAB — TROPONIN I
Troponin-I: <0.02 ng/mL
Troponin-I: <0.02 ng/mL

## 2014-10-08 ENCOUNTER — Encounter: Payer: Self-pay | Admitting: Family Medicine

## 2014-10-08 ENCOUNTER — Other Ambulatory Visit: Payer: Self-pay | Admitting: Internal Medicine

## 2014-10-08 ENCOUNTER — Encounter: Payer: Self-pay | Admitting: Internal Medicine

## 2014-10-08 ENCOUNTER — Ambulatory Visit (INDEPENDENT_AMBULATORY_CARE_PROVIDER_SITE_OTHER): Payer: Medicare Other | Admitting: Family Medicine

## 2014-10-08 VITALS — BP 100/60 | HR 80 | Temp 98.0°F | Wt 102.0 lb

## 2014-10-08 DIAGNOSIS — R079 Chest pain, unspecified: Secondary | ICD-10-CM

## 2014-10-08 DIAGNOSIS — M109 Gout, unspecified: Secondary | ICD-10-CM

## 2014-10-08 DIAGNOSIS — M1009 Idiopathic gout, multiple sites: Secondary | ICD-10-CM

## 2014-10-08 MED ORDER — INDOMETHACIN ER 75 MG PO CPCR: 75.0000 mg | ORAL_CAPSULE | Freq: Two times a day (BID) | ORAL | Status: DC

## 2014-10-08 MED ORDER — METHYLPREDNISOLONE ACETATE 80 MG/ML IJ SUSP
80.0000 mg | Freq: Once | INTRAMUSCULAR | Status: AC
  Administered 2014-10-08: 80 mg via INTRAMUSCULAR

## 2014-10-08 NOTE — Patient Instructions (Addendum)
Very nice to meet you Get labs on Monday and I will release them to you when I get them Try the indomethacin twice daily for next week.  Stop the ibuprofen.  Increase the omeprazole to 2 times daily while on the indomethacin.  Ice 20 minutes 2 times daily. Usually after activity and before bed. Gave you a shot today to help with some of the pain The ECG looks perfect.  See me again  In 2 weeks if elbow is not perfect.  Otherwise follow up with Dr. Derrel Nip.  If worsening chest pain, shortness of breath or fever please go to ED immediately.    Pseudogout Pseudogout is similar to gout. It is an arthritis that causes pain, swelling, and inflammation in a joint. This is due to the presence of calcium pyrophosphate crystals in the joint fluid. This is a different type of crystal than the crystals that cause gout. The joint pain can be severe and may last for days. In some cases, it may last much longer and can mimic rheumatoid arthritis or osteoarthritis. CAUSES  The exact cause of the disease is not known. It develops when crystals of calcium pyrophosphate build up in a joint. These crystals cause inflammation that leads to pain and swelling of the joint.  Chances of developing pseudogout increase with age. It often follows a minor injury.  The condition may be passed down from parent to child (hereditary).  Events such as strokes, heart attacks, or surgery may increase the risk of pseudogout.  Pseudogout can be associated with other disorders (hemophilia, ochronosis, amyloidosis, or hormonal disorders).  Pseudogout can be associated with dehydration, especially following surgery or hospitalization. Patients with known pseudogout should stay well hydrated before and after surgery. SYMPTOMS   Intense, constant pain in one joint that seems to come on for no reason.  The joint area may be hot to the touch, red, swollen, and stiff.  Pain may last from several days to a few weeks. It may then  disappear. Later, it may start again, possibly in a different joint.  Pseudogout usually affects the knees. It can also affect the wrists, elbows, shoulders, and ankles. DIAGNOSIS  The diagnosis is often suggested by your exam or is suspected when an abnormal buildup of calcium salts (calcifications) are seen in the cartilage of joints on X-rays. The final diagnosis is made when fluid from the joint is examined under a special microscope used to find calcium pyrophosphate crystals. The crystals of pseudogout and gout may both be present at the same time. TREATMENT  Nonsteroidal anti-inflammatory drugs (NSAIDs), such as naproxen, treat the pain. Identifying the trigger of pseudogout and treating the underlying cause, such as dehydration, is also important. There is no way to remove the crystals themselves. HOME CARE INSTRUCTIONS   Put ice on the sore joint.  Put ice in a plastic bag.  Place a towel between your skin and the bag.  Leave the ice on for 15-20 minutes at a time, 03-04 times a day, for the first 2 days.  Keep your affected joints raised (elevated) when possible to lessen swelling.  Use crutches (non-weight bearing) as needed. Walk without crutches as the pain allows, or as instructed. Gradually, start bearing weight.  Only take over-the-counter or prescription medicines for pain, discomfort, or fever as directed by your caregiver.  Once you recover from the painful attack, exercise regularly to keep your muscle strength. Not using a sore joint will cause the muscles around it to become  weak. This may increase pain. Low-impact exercises, such as swimming, bicycling, water aerobics, and walking, may be best. This will give you energy, strengthen your heart, help you control your weight, and improve your well-being.  Maintain a healthy weight so your joints do not need to bear more weight than necessary. SEEK MEDICAL CARE IF:   You have an increase in joint pain not relieved with  medicine.  You have a fever.  You have more serious symptoms such as skin rash, diarrhea, vomiting, headache, or other joint pains. FOR MORE INFORMATION  Arthritis Foundation: www.arthritis.[facility] of Arthritis and Musculoskeletal and Skin Diseases: www.niams.[employer] Document Released: 07/20/2004 Document Revised: 01/20/2012 Document Reviewed: 02/09/2010 [facility] Patient Information 2015 [place], [place]. This information is not intended to replace advice given to you by your health care provider. Make sure you discuss any questions you have with your health care provider.

## 2014-10-08 NOTE — Progress Notes (Signed)
  [facility] [address] [facility], [place] [address] Phone: [phone] Subjective:     CC: Chest and arm discomfort  [id number] Tamara [name] is a 68 y.o. female coming in with complaint of chest and left elbow pain. Patient was seen and [facility] yesterday. Patient did have an EKG at that time. She was told that his potentials changes needed to follow-up. Patient states that the chest pain seems to be getting a little bit better. Denies that it is with any type of exacerbation in his, more of a chronic dull aching pain. Denies that it is associated with any food or eating. Patient does have a past medical history significant for ulcerative colitis as well as gastroesophageal reflux disease. Patient states overall she is not having worsening pain of the chest. He is responding to conservative therapy at this time.  Patient was also complaining of left elbow pain. States it has been swollen and even a little bit red. States that there is no fevers or chills or any abnormal weight loss. States though that is difficult to move or extend her elbow completely. Describes it as a throbbing sensation that can be sharp. Patient did have x-rays. X-rays the patient's elbow previously did show some CPPD but no significant fracture or dislocation of the elbow. Patient rates the pain as 8 out of 10 in severity. Has not tried any other home modalities other than ibuprofen. Is waking her up at night.     Past medical history, social, surgical and family history all reviewed in electronic medical record.   Review of Systems: No headache, visual changes, nausea, vomiting, diarrhea, constipation, dizziness, abdominal pain, skin rash, fevers, chills, night sweats, weight loss, swollen lymph nodes, body aches, joint swelling, muscle aches, chest pain, shortness of breath, mood changes.   Objective Blood pressure 100/60, pulse 80, temperature 98 F (36.7 C), temperature  source Oral, weight 102 lb (46.267 kg), SpO2 95 %.  General: No apparent distress alert and oriented x3 mood and affect normal, dressed appropriately.  HEENT: Pupils equal, extraocular movements intact  Respiratory: Patient's speak in full sentences and does not appear short of breath  Cardiovascular: No lower extremity edema, minimal tenderness over the costochondritis juncture on the left side, no erythema regular rate and rhythm no murmur appreciated Skin: Warm dry intact with no signs of infection or rash on extremities or on axial skeleton.  Abdomen: Soft nontender  Neuro: Cranial nerves II through XII are intact, neurovascularly intact in all extremities with 2+ DTRs and 2+ pulses.  Lymph: No lymphadenopathy of posterior or anterior cervical chain or axillae bilaterally.  Gait normal with good balance and coordination.  MSK:  Non tender with full range of motion and good stability and symmetric strength and tone of shoulders,  wrist, hip, knee and ankles bilaterally.  Elbow: Left On inspection patient does have trace effusion compared to the contralateral side. Range of motion is limited lacking the last 5 of extension Stable to varus, valgus stress. Negative moving valgus stress test. Diffuse tenderness noted. No signs of infection. Ulnar nerve does not sublux. Negative cubital tunnel Tinel's. Contralateral elbow unremarkable.   Impression and Recommendations:     This case required medical decision making of moderate complexity.

## 2014-10-09 ENCOUNTER — Other Ambulatory Visit: Payer: Self-pay | Admitting: Internal Medicine

## 2014-10-10 ENCOUNTER — Other Ambulatory Visit: Payer: Medicare Other

## 2014-10-10 ENCOUNTER — Telehealth: Payer: Self-pay | Admitting: Internal Medicine

## 2014-10-10 NOTE — Telephone Encounter (Signed)
Pt called stating she saw Dr Tamala Julian over the weekend. She wanted to know if her chest should still be hurting. I told her to see her discharge instructions regarding if chest pain worsens or any chest issues to go to ER. Pt wants note sent to Dr Tamala Julian

## 2014-10-10 NOTE — Telephone Encounter (Signed)
Noted. Dr. Tamala Julian has been made aware & agrees with what the pt has been told.

## 2014-10-12 ENCOUNTER — Emergency Department: Payer: Self-pay | Admitting: Emergency Medicine

## 2014-10-12 LAB — CBC WITH DIFFERENTIAL/PLATELET
Basophil #: 0.1 10*3/uL (ref 0.0–0.1)
Basophil %: 0.6 %
EOS ABS: 0.1 10*3/uL (ref 0.0–0.7)
Eosinophil %: 1.1 %
HCT: 42.4 % (ref 35.0–47.0)
HGB: 14 g/dL (ref 12.0–16.0)
Lymphocyte #: 3.6 10*3/uL (ref 1.0–3.6)
Lymphocyte %: 34 %
MCH: 34.4 pg — ABNORMAL HIGH (ref 26.0–34.0)
MCHC: 33 g/dL (ref 32.0–36.0)
MCV: 104 fL — ABNORMAL HIGH (ref 80–100)
MONO ABS: 1.6 x10 3/mm — AB (ref 0.2–0.9)
Monocyte %: 15 %
Neutrophil #: 5.2 10*3/uL (ref 1.4–6.5)
Neutrophil %: 49.3 %
Platelet: 270 10*3/uL (ref 150–440)
RBC: 4.07 10*6/uL (ref 3.80–5.20)
RDW: 13.3 % (ref 11.5–14.5)
WBC: 10.6 10*3/uL (ref 3.6–11.0)

## 2014-10-12 LAB — COMPREHENSIVE METABOLIC PANEL
ALT: 37 U/L
Albumin: 3.7 g/dL (ref 3.4–5.0)
Alkaline Phosphatase: 55 U/L
Anion Gap: 8 (ref 7–16)
BILIRUBIN TOTAL: 0.3 mg/dL (ref 0.2–1.0)
BUN: 19 mg/dL — ABNORMAL HIGH (ref 7–18)
Calcium, Total: 8.4 mg/dL — ABNORMAL LOW (ref 8.5–10.1)
Chloride: 104 mmol/L (ref 98–107)
Co2: 24 mmol/L (ref 21–32)
Creatinine: 0.8 mg/dL (ref 0.60–1.30)
GLUCOSE: 110 mg/dL — AB (ref 65–99)
Osmolality: 275 (ref 275–301)
Potassium: 4.1 mmol/L (ref 3.5–5.1)
SGOT(AST): 29 U/L (ref 15–37)
SODIUM: 136 mmol/L (ref 136–145)
Total Protein: 6.1 g/dL — ABNORMAL LOW (ref 6.4–8.2)

## 2014-10-12 LAB — URINALYSIS, COMPLETE
Bacteria: NONE SEEN
Bilirubin,UR: NEGATIVE
Blood: NEGATIVE
Glucose,UR: NEGATIVE mg/dL (ref 0–75)
Ketone: NEGATIVE
LEUKOCYTE ESTERASE: NEGATIVE
NITRITE: NEGATIVE
PH: 6 (ref 4.5–8.0)
PROTEIN: NEGATIVE
RBC,UR: NONE SEEN /HPF (ref 0–5)
SPECIFIC GRAVITY: 1.006 (ref 1.003–1.030)
Squamous Epithelial: <1
WBC UR: NONE SEEN /HPF (ref 0–5)

## 2014-10-12 LAB — TROPONIN I

## 2014-10-24 ENCOUNTER — Other Ambulatory Visit: Payer: Self-pay | Admitting: Internal Medicine

## 2014-10-24 ENCOUNTER — Encounter: Payer: Self-pay | Admitting: Internal Medicine

## 2014-11-02 ENCOUNTER — Encounter: Payer: Self-pay | Admitting: Internal Medicine

## 2014-11-05 ENCOUNTER — Other Ambulatory Visit: Payer: Self-pay | Admitting: Internal Medicine

## 2014-11-07 ENCOUNTER — Encounter: Payer: Self-pay | Admitting: Internal Medicine

## 2014-11-07 NOTE — Telephone Encounter (Signed)
Called to pharmacy 

## 2014-11-07 NOTE — Telephone Encounter (Signed)
Last visit 09/08/14

## 2014-11-07 NOTE — Telephone Encounter (Signed)
Ok to refill,  Authorized in epic for phone in

## 2014-12-12 ENCOUNTER — Telehealth: Payer: Self-pay | Admitting: *Deleted

## 2014-12-12 ENCOUNTER — Encounter: Payer: Self-pay | Admitting: Internal Medicine

## 2014-12-12 ENCOUNTER — Ambulatory Visit (INDEPENDENT_AMBULATORY_CARE_PROVIDER_SITE_OTHER): Payer: Medicare Other | Admitting: Internal Medicine

## 2014-12-12 VITALS — BP 100/64 | HR 94 | Temp 98.1°F | Resp 14 | Ht 61.0 in | Wt 101.5 lb

## 2014-12-12 DIAGNOSIS — I951 Orthostatic hypotension: Secondary | ICD-10-CM

## 2014-12-12 DIAGNOSIS — H539 Unspecified visual disturbance: Secondary | ICD-10-CM

## 2014-12-12 DIAGNOSIS — Z716 Tobacco abuse counseling: Secondary | ICD-10-CM

## 2014-12-12 DIAGNOSIS — E441 Mild protein-calorie malnutrition: Secondary | ICD-10-CM

## 2014-12-12 DIAGNOSIS — R079 Chest pain, unspecified: Secondary | ICD-10-CM

## 2014-12-12 DIAGNOSIS — E785 Hyperlipidemia, unspecified: Secondary | ICD-10-CM

## 2014-12-12 LAB — COMPREHENSIVE METABOLIC PANEL
ALBUMIN: 4.6 g/dL (ref 3.5–5.2)
ALT: 28 U/L (ref 0–35)
AST: 26 U/L (ref 0–37)
Alkaline Phosphatase: 49 U/L (ref 39–117)
BUN: 10 mg/dL (ref 6–23)
CO2: 25 mEq/L (ref 19–32)
CREATININE: 0.74 mg/dL (ref 0.40–1.20)
Calcium: 9.9 mg/dL (ref 8.4–10.5)
Chloride: 105 mEq/L (ref 96–112)
GFR: 83.17 mL/min (ref >60.00)
Glucose, Bld: 95 mg/dL (ref 70–99)
Potassium: 3.8 mEq/L (ref 3.5–5.1)
Sodium: 138 mEq/L (ref 135–145)
Total Bilirubin: 0.5 mg/dL (ref 0.2–1.2)
Total Protein: 7 g/dL (ref 6.0–8.3)

## 2014-12-12 LAB — MAGNESIUM: Magnesium: 1.8 mg/dL (ref 1.5–2.5)

## 2014-12-12 LAB — LIPID PANEL
Cholesterol: 209 mg/dL — ABNORMAL HIGH (ref 0–200)
HDL: 75.4 mg/dL (ref >39.00)
LDL Cholesterol: 110 mg/dL — ABNORMAL HIGH (ref 0–99)
NONHDL: 133.6
TRIGLYCERIDES: 118 mg/dL (ref 0.0–149.0)
Total CHOL/HDL Ratio: 3
VLDL: 23.6 mg/dL (ref 0.0–40.0)

## 2014-12-12 LAB — URIC ACID: Uric Acid, Serum: 3.9 mg/dL (ref 2.4–7.0)

## 2014-12-12 MED ORDER — CYANOCOBALAMIN 1000 MCG/ML IJ SOLN: INTRAMUSCULAR | Status: DC

## 2014-12-12 MED ORDER — BUPROPION HCL ER (XL) 150 MG PO TB24: 150.0000 mg | ORAL_TABLET | Freq: Every day | ORAL | Status: DC

## 2014-12-12 NOTE — Progress Notes (Signed)
Pre-visit discussion using our clinic review tool. No additional management support is needed unless otherwise documented below in the visit note.  

## 2014-12-12 NOTE — Telephone Encounter (Signed)
Pt said she forgot to mention to you that she is taking a Asprin

## 2014-12-12 NOTE — Patient Instructions (Addendum)
You need 3 16 ounce servings of noncaffeinate beverages daily  (water,  Gatorade,  Fruit juice)  To keep your blood pressure up because you are dehydrated.   You recent vision loss may have been a pre-stroke  (TIA).  Transient Ischemic Attack A transient ischemic attack (TIA) is a "warning stroke" that causes stroke-like symptoms. Unlike a stroke, a TIA does not cause permanent damage to the brain. The symptoms of a TIA can happen very fast and do not last long. It is important to know the symptoms of a TIA and what to do. This can help prevent a major stroke or death. CAUSES   A TIA is caused by a temporary blockage in an artery in the brain or neck (carotid artery). The blockage does not allow the brain to get the blood supply it needs and can cause different symptoms. The blockage can be caused by either:  A blood clot.  Fatty buildup (plaque) in a neck or brain artery. RISK FACTORS  High blood pressure (hypertension).  High cholesterol.  Diabetes mellitus.  Heart disease.  The build up of plaque in the blood vessels (peripheral artery disease or atherosclerosis).  The build up of plaque in the blood vessels providing blood and oxygen to the brain (carotid artery stenosis).  An abnormal heart rhythm (atrial fibrillation).  Obesity.  Smoking.  Taking oral contraceptives (especially in combination with smoking).  Physical inactivity.  A diet high in fats, salt (sodium), and calories.  Alcohol use.  Use of illegal drugs (especially cocaine and methamphetamine).  Being female.  Being African American.  Being over the age of 87.  Family history of stroke.  Previous history of blood clots, stroke, TIA, or heart attack.  Sickle cell disease. SYMPTOMS  TIA symptoms are the same as a stroke but are temporary. These symptoms usually develop suddenly, or may be newly present upon awakening from sleep:  Sudden weakness or numbness of the face, arm, or leg, especially on  one side of the body.  Sudden trouble walking or difficulty moving arms or legs.  Sudden confusion.  Sudden personality changes.  Trouble speaking (aphasia) or understanding.  Difficulty swallowing.  Sudden trouble seeing in one or both eyes.  Double vision.  Dizziness.  Loss of balance or coordination.  Sudden severe headache with no known cause.  Trouble reading or writing.  Loss of bowel or bladder control.  Loss of consciousness. DIAGNOSIS  Your caregiver may be able to determine the presence or absence of a TIA based on your symptoms, history, and physical exam. Computed tomography (CT scan) of the brain is usually performed to help identify a TIA. Other tests may be done to diagnose a TIA. These tests may include:  Electrocardiography.  Continuous heart monitoring.  Echocardiography.  Carotid ultrasonography.  Magnetic resonance imaging (MRI).  A scan of the brain circulation.  Blood tests. PREVENTION  The risk of a TIA can be decreased by appropriately treating high blood pressure, high cholesterol, diabetes, heart disease, and obesity and by quitting smoking, limiting alcohol, and staying physically active. TREATMENT  Time is of the essence. Since the symptoms of TIA are the same as a stroke, it is important to seek treatment as soon as possible because you may need a medicine to dissolve the clot (thrombolytic) that cannot be given if too much time has passed. Treatment options vary. Treatment options may include rest, oxygen, intravenous (IV) fluids, and medicines to thin the blood (anticoagulants). Medicines and diet may be used  to address diabetes, high blood pressure, and other risk factors. Measures will be taken to prevent short-term and long-term complications, including infection from breathing foreign material into the lungs (aspiration pneumonia), blood clots in the legs, and falls. Treatment options include procedures to either remove plaque in the  carotid arteries or dilate carotid arteries that have narrowed due to plaque. Those procedures are:  Carotid endarterectomy.  Carotid angioplasty and stenting. HOME CARE INSTRUCTIONS   Take all medicines prescribed by your caregiver. Follow the directions carefully. Medicines may be used to control risk factors for a stroke. Be sure you understand all your medicine instructions.  You may be told to take aspirin or the anticoagulant warfarin. Warfarin needs to be taken exactly as instructed.  Taking too much or too little warfarin is dangerous. Too much warfarin increases the risk of bleeding. Too little warfarin continues to allow the risk for blood clots. While taking warfarin, you will need to have regular blood tests to measure your blood clotting time. A PT blood test measures how long it takes for blood to clot. Your PT is used to calculate another value called an INR. Your PT and INR help your caregiver to adjust your dose of warfarin. The dose can change for many reasons. It is critically important that you take warfarin exactly as prescribed.  Many foods, especially foods high in vitamin K can interfere with warfarin and affect the PT and INR. Foods high in vitamin K include spinach, kale, broccoli, cabbage, collard and turnip greens, brussels sprouts, peas, cauliflower, seaweed, and parsley as well as beef and pork liver, green tea, and soybean oil. You should eat a consistent amount of foods high in vitamin K. Avoid major changes in your diet, or notify your caregiver before changing your diet. Arrange a visit with a dietitian to answer your questions.  Many medicines can interfere with warfarin and affect the PT and INR. You must tell your caregiver about any and all medicines you take, this includes all vitamins and supplements. Be especially cautious with aspirin and anti-inflammatory medicines. Do not take or discontinue any prescribed or over-the-counter medicine except on the advice of  your caregiver or pharmacist.  Warfarin can have side effects, such as excessive bruising or bleeding. You will need to hold pressure over cuts for longer than usual. Your caregiver or pharmacist will discuss other potential side effects.  Avoid sports or activities that may cause injury or bleeding.  Be mindful when shaving, flossing your teeth, or handling sharp objects.  Alcohol can change the body's ability to handle warfarin. It is best to avoid alcoholic drinks or consume only very small amounts while taking warfarin. Notify your caregiver if you change your alcohol intake.  Notify your dentist or other caregivers before procedures.  Eat a diet that includes 5 or more servings of fruits and vegetables each day. This may reduce the risk of stroke. Certain diets may be prescribed to address high blood pressure, high cholesterol, diabetes, or obesity.  A low-sodium, low-saturated fat, low-trans fat, low-cholesterol diet is recommended to manage high blood pressure.  A low-saturated fat, low-trans fat, low-cholesterol, and high-fiber diet may control cholesterol levels.  A controlled-carbohydrate, controlled-sugar diet is recommended to manage diabetes.  A reduced-calorie, low-sodium, low-saturated fat, low-trans fat, low-cholesterol diet is recommended to manage obesity.  Maintain a healthy weight.  Stay physically active. It is recommended that you get at least 30 minutes of activity on most or all days.  Do not smoke.  Limit alcohol use even if you are not taking warfarin. Moderate alcohol use is considered to be:  No more than 2 drinks each day for men.  No more than 1 drink each day for nonpregnant women.  Stop drug abuse.  Home safety. A safe home environment is important to reduce the risk of falls. Your caregiver may arrange for specialists to evaluate your home. Having grab bars in the bedroom and bathroom is often important. Your caregiver may arrange for equipment to  be used at home, such as raised toilets and a seat for the shower.  Follow all instructions for follow-up with your caregiver. This is very important. This includes any referrals and lab tests. Proper follow up can prevent a stroke or another TIA from occurring. SEEK MEDICAL CARE IF:  You have personality changes.  You have difficulty swallowing.  You are seeing double.  You have dizziness.  You have a fever.  You have skin breakdown. SEEK IMMEDIATE MEDICAL CARE IF:  Any of these symptoms may represent a serious problem that is an emergency. Do not wait to see if the symptoms will go away. Get medical help right away. Call your local emergency services (911 in [place] Do not drive yourself to the hospital.  You have sudden weakness or numbness of the face, arm, or leg, especially on one side of the body.  You have sudden trouble walking or difficulty moving arms or legs.  You have sudden confusion.  You have trouble speaking (aphasia) or understanding.  You have sudden trouble seeing in one or both eyes.  You have a loss of balance or coordination.  You have a sudden, severe headache with no known cause.  You have new chest pain or an irregular heartbeat.  You have a partial or total loss of consciousness. MAKE SURE YOU:   Understand these instructions.  Will watch your condition.  Will get help right away if you are not doing well or get worse. Document Released: 08/07/2005 Document Revised: 11/02/2013 Document Reviewed: 02/02/2014 [facility] Patient Information 2015 [place], [place]. This information is not intended to replace advice given to you by your health care provider. Make sure you discuss any questions you have with your health care provider.       I am starting  you on Wellbutrin to help you quit smoking.  Take it once daily in the morning   This is  one version of a  "Low GI"  Diet:  It will still lower yourhusband's blood sugars and allow  Him to lose 4  to 8  lbs  per month if you follow it carefully.  Your goal with exercise is a minimum of 30 minutes of aerobic exercise 5 days per week (Walking does not count once it becomes easy!)    All of the foods can be found at grocery stores and in bulk at [employer].  The Atkins protein bars and shakes are available in more varieties at Target, [employer] and [place].     7 AM Breakfast:  Choose from the following:  Low carbohydrate Protein  Shakes (I recommend the EAS AdvantEdge "Carb Control" shakes  Or the low carb shakes by Atkins.    2.5 carbs   Arnold's "Sandwhich Thin"toasted  w/ peanut butter (no jelly: about 20 net carbs  "Bagel Thin" with cream cheese and salmon: about 20 carbs   a scrambled egg/bacon/cheese burrito made with Mission's "carb balance" whole wheat tortilla  (about 10 net carbs )  Avoid cereal and bananas, oatmeal and cream of wheat and grits. They are loaded with carbohydrates!   10 AM: high protein snack  Protein bar by Atkins (the snack size, under 200 cal, usually < 6 net carbs).    A stick of cheese:  Around 1 carb,  100 cal     Dannon Light n Fit [place] Yogurt  (80 cal, 8 carbs)  Other so called "protein bars" and Greek yogurts tend to be loaded with carbohydrates.  Remember, in food advertising, the word "energy" is synonymous for " carbohydrate."  Lunch:   A Sandwich using the bread choices listed, Can use any  Eggs,  lunchmeat, grilled meat or canned tuna), avocado, regular mayo/mustard  and cheese.  A Salad using blue cheese, ranch,  Goddess or vinagrette,  No croutons or "confetti" and no "candied nuts" but regular nuts OK.   No pretzels or chips.  Pickles and miniature sweet peppers are a good low carb alternative that provide a "crunch"  The bread is the only source of carbohydrate in a sandwich and  can be decreased by trying some of these alternatives to traditional loaf bread  Joseph's makes a pita bread and a flat bread that are 50 cal and 4 net carbs  available at [place] and [place].  This can be toasted to use with hummous as well  Toufayan makes a low carb flatbread that's 100 cal and 9 net carbs available at [employer] and [employer]J's makes 2 sizes of  Low carb whole wheat tortilla  (The large one is 210 cal and 6 net carbs) Avoid "Low fat dressings, as well as Barry Brunner and [place] dressings They are loaded with sugar!   3 PM/ Mid day  Snack:  Consider  1 ounce of  almonds, walnuts, pistachios, pecans, peanuts,  Macadamia nuts or a nut medley.  Avoid "granola"; the dried cranberries and raisins are loaded with carbohydrates. Mixed nuts as long as there are no raisins,  cranberries or dried fruit.    Try the prosciutto/mozzarella cheese sticks by Fiorruci  In deli /backery section   High protein      6 PM  Dinner:     Meat/fowl/fish with a green salad, and either broccoli, cauliflower, green beans, spinach, brussel sprouts or  Lima beans. DO NOT BREAD THE PROTEIN!!      There is a low carb pasta by Dreamfield's that is acceptable and tastes great: only 5 digestible carbs/serving.( All grocery stores but BJs carry it )  Try Hurley Cisco Angelo's chicken piccata or chicken or eggplant parm over low carb pasta.(Lowes and BJs)   Marjory Lies Sanchez's "Carnitas" (pulled pork, no sauce,  0 carbs) or his beef pot roast to make a dinner burrito (at BJ's)  Pesto over low carb pasta (bj's sells a good quality pesto in the center refrigerated section of the deli   Try satueeing  Cheral Marker with mushroooms  Whole wheat pasta is still full of digestible carbs and  Not as low in glycemic index as Dreamfield's.   Brown rice is still rice,  So skip the rice and noodles if you eat [place] or [place] (or at least limit to 1/2 cup)  9 PM snack :   Breyer's "low carb" fudgsicle or  ice cream bar (Carb Smart line), or  Weight Watcher's ice cream bar , or another "no sugar added" ice cream;  a serving of fresh berries/cherries with whipped cream   Cheese or DANNON'S  LlGHT N  [place] YOGURT  8 ounces of [place] unsweetened almond/cococunut milk    Avoid bananas, pineapple, grapes  and watermelon on a regular basis because they are high in sugar.  THINK OF THEM AS DESSERT  Remember that snack Substitutions should be less than 10 NET carbs per serving and meals < 20 carbs. Remember to subtract fiber grams to get the "net carbs."

## 2014-12-12 NOTE — Progress Notes (Signed)
Patient ID: Tamara [name], female   DOB: 09-10-1946, 69 y.o.   MRN: [id number]  Patient Active Problem List   Diagnosis Date Noted  . Orthostasis 12/13/2014  . Transient vision disturbance, bilateral 12/13/2014  . Uric acid arthropathy 10/08/2014  . Headache, chronic migraine without aura 09/10/2014  . Chronic headache 03/11/2014  . Carpal tunnel syndrome 03/08/2014  . Feeling weak 03/08/2014  . Cough 03/08/2014  . Tobacco abuse counseling 01/26/2014  . B12 deficiency 01/26/2014  . Tobacco abuse 01/26/2014  . Routine general medical examination at a health care facility 05/11/2013  . Chest pain 05/11/2013  . Osteoporosis, unspecified 11/19/2011  . Chronic abdominal pain   . H/O small bowel obstruction   . Crohn's disease   . History of DVT (deep vein thrombosis) 01/10/2007    Subjective:  CC:   Chief Complaint  Patient presents with  . Follow-up    Patient feels spaced out has seen dr. Sharyne Peach for chest pain last week  is having stress tst on Thursday.    HPI:   Tamara [name] is a 69 y.o. female who presents for  3 month follow up on chronic conditions including anxiety,  Chronic headaches, and tobacco abuse.  1) Chest pain.  She has been having episodes of atypical chest pain, saw Serafina Royals andis scheduled for a stress ECHO this week.  2) Orthostasis  With light headedness. Doesn't vomit because of esophageal surgery for reflux,  And has only had one episode of mild  Nause.  Denies  diarrhea .    Appetite is ok and is upset that she has gained a lb. Light headedness has been occurring daily for a month .  No use of diuretics.  Drinks a lot of caffeinated beverages,  No water.  During one such episode she states that she had complete bilateral loss of  Vision which lasted for 30 minutes,  But she did not call 91 or go to ER .  She has no history of falls,  Continues to smoke cigarettes daily but has reduced daily use to under a pack.   3) Anxiety:  Husband has  serious medical conditions along with dementia,  Often cites his health as the main aggravating factor for her fatigue , insomnia,  anxiety and inconsistent care.  Cannot afford additional help for him at home.      Past Medical History  Diagnosis Date  . Osteoporosis   . Chronic abdominal pain   . Crohn's disease   . History of DVT (deep vein thrombosis) 01/2007    post operative right leg, s/p vena cava filter  . Bowel obstruction     s/p laparotomy/ LOA 3/08, prior reversal of jejunal bypasss 2004    Past Surgical History  Procedure Laterality Date  . Cholecystectomy    . Cervical fusion  2005    C6-7, anterior approach  . 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 Rotator cuff repair      right  . Appendectomy    . Gyn surgery      hysterectomy  . Laparotomy  01/30/07    for bowel obstruction with LOA  . Cataract extraction    . Nissen fundoplication    . Abdominal hysterectomy         The following portions of the patient's history were reviewed and updated as appropriate: Allergies, current medications, and problem list.    Review of Systems:   Patient denies headache, fevers,  malaise, unintentional weight loss, skin rash, eye pain, sinus congestion and sinus pain, sore throat, dysphagia,  hemoptysis , cough, dyspnea, wheezing, chest pain, palpitations, orthopnea, edema, abdominal pain, nausea, melena, diarrhea, constipation, flank pain, dysuria, hematuria, urinary  Frequency, nocturia, numbness, tingling, seizures,  Focal weakness, Loss of consciousness,  Tremor, insomnia, depression, anxiety, and suicidal ideation.     History   Social History  . Marital Status: Married    Spouse Name: N/A    Number of Children: N/A  . Years of Education: N/A   Occupational History  . Not on file.   Social History Main Topics  . Smoking status: Current Every Day Smoker  . Smokeless tobacco: Not on file     Comment: half to one pack of cigs per day  . Alcohol  Use: No  . Drug Use: Not on file  . Sexual Activity: Not on file   Other Topics Concern  . Not on file   Social History Narrative   Lives with spouse.    Objective:  Filed Vitals:   12/12/14 0908  BP: 100/64  Pulse: 94  Temp: 98.1 F (36.7 C)  Resp: 14     General appearance: alert, cooperative and appears stated age Ears: normal TM's and external ear canals both ears Throat: lips, mucosa, and tongue normal; teeth and gums normal Neck: no adenopathy, no carotid bruit, supple, symmetrical, trachea midline and thyroid not enlarged, symmetric, no tenderness/mass/nodules Back: symmetric, no curvature. ROM normal. No CVA tenderness. Lungs: clear to auscultation bilaterally Heart: regular rate and rhythm, S1, S2 normal, no murmur, click, rub or gallop Abdomen: soft, non-tender; bowel sounds normal; no masses,  no organomegaly Pulses: 2+ and symmetric Skin: Skin color, texture, turgor normal. No rashes or lesions Lymph nodes: Cervical, supraclavicular, and axillary nodes normal.  Assessment and Plan:  Orthostasis Symptomatic ,reviewed list of meds and not taking a diuretic.  Her Am cortisol was normal today,  Advised to increase hydration and stop prn use of spironolactone, whci she has NOT been taking, .  Will consider referral to cardiology for POTS if persistent.    Tobacco abuse counseling Sh eis at high risk for stroke and CAD .  wellbutrin prescribed today after long discussio nof pros and cons of pharmacotherapy.    Transient vision disturbance, bilateral Her recent episode of vision loss , bilateral is concerning for migraine vs VBI given risk factors for CVA (tobacco) , orthostasis and brain MRI done May 2015 reporting  diffuse small vessel ischemic changes .  Advised to quit smoking and follow up with her neurologist .  MRA of brain ordered to rule out VBI    Updated Medication List Outpatient Encounter Prescriptions as of 12/12/2014  Medication Sig  . budesonide  (ENTOCORT EC) 3 MG 24 hr capsule Take one by mouth three times daily   . clobetasol ointment (TEMOVATE) 0.05 % Apply topically 2 (two) times daily. To labia For two to three weeks,  Then reduce use to twice per week  . clonazePAM (KLONOPIN) 0.5 MG tablet TAKE 1 TABLET BY MOUTH ONCE DAILY AS NEEDED FOR ANXIETY.  . cyanocobalamin (,VITAMIN B-12,) 1000 MCG/ML injection 1000 mcg IM injection weekly  . ergocalciferol (DRISDOL) 50000 UNITS capsule Take 1 capsule (50,000 Units total) by mouth once a week.  . fluticasone (FLONASE) 50 MCG/ACT nasal spray 2 sprays in each nostril once daily  . HYDROcodone-acetaminophen (NORCO) 10-325 MG per tablet Take 1 tablet by mouth every 8 (eight) hours as needed.  Marland Kitchen  indomethacin (INDOCIN SR) 75 MG CR capsule Take 1 capsule (75 mg total) by mouth 2 (two) times daily with a meal.  . mesalamine (PENTASA) 250 MG CR capsule Take 250 mg by mouth 3 (three) times daily.    . nortriptyline (PAMELOR) 25 MG capsule TAKE 1 CAPSULE BY MOUTH EACH NIGHT AT BEDTIME.  Marland Kitchen omeprazole (PRILOSEC) 20 MG capsule Take 2 by mouth daily   . promethazine (PHENERGAN) 25 MG tablet Take one by mouth as needed   . sertraline (ZOLOFT) 100 MG tablet Take 1 tablet (100 mg total) by mouth daily.  . Syringe, Disposable, 3 ML MISC For use with B12 injections weekly/monthly  . traZODone (DESYREL) 50 MG tablet TAKE 1/2 TO 1 TABLET BY MOUTH AT BEDTIMEAS NEEDED FOR SLEEP  . zoledronic acid (RECLAST) 5 MG/100ML SOLN Inject 5 mg into the vein once. yearly   . [DISCONTINUED] cyanocobalamin (,VITAMIN B-12,) 1000 MCG/ML injection 1000 mcg IM injection weekly for 3 weeks,  Then continue to inject I ml IM monthly  . [DISCONTINUED] spironolactone (ALDACTONE) 25 MG tablet Take 1 tablet (25 mg total) by mouth daily. Prn edema  . buPROPion (WELLBUTRIN XL) 150 MG 24 hr tablet Take 1 tablet (150 mg total) by mouth daily.     Orders Placed This Encounter  Procedures  . HM MAMMOGRAPHY  . MR MRA Head/Brain Wo Cm  .  Cortisol-am, blood  . Lipid panel  . Comp Met (CMET)  . T4 AND TSH  . Magnesium    No Follow-up on file.

## 2014-12-13 ENCOUNTER — Telehealth: Payer: Self-pay | Admitting: Internal Medicine

## 2014-12-13 ENCOUNTER — Encounter: Payer: Self-pay | Admitting: Internal Medicine

## 2014-12-13 DIAGNOSIS — G453 Amaurosis fugax: Secondary | ICD-10-CM

## 2014-12-13 DIAGNOSIS — I951 Orthostatic hypotension: Secondary | ICD-10-CM | POA: Insufficient documentation

## 2014-12-13 HISTORY — DX: Amaurosis fugax: G45.3

## 2014-12-13 LAB — T4 AND TSH
T4 TOTAL: 6.5 ug/dL (ref 4.5–12.0)
TSH: 2.47 u[IU]/mL (ref 0.450–4.500)

## 2014-12-13 LAB — CORTISOL-AM, BLOOD: CORTISOL - AM: 9.2 ug/dL (ref 4.3–22.4)

## 2014-12-13 NOTE — Assessment & Plan Note (Addendum)
Symptomatic ,reviewed list of meds and not taking a diuretic.  Her Am cortisol was normal today,  Advised to increase hydration and stop prn use of spironolactone, whci she has NOT been taking, .  Will consider referral to cardiology for POTS if persistent.

## 2014-12-13 NOTE — Assessment & Plan Note (Signed)
Sh eis at high risk for stroke and CAD .  wellbutrin prescribed today after long discussio nof pros and cons of pharmacotherapy.

## 2014-12-13 NOTE — Assessment & Plan Note (Addendum)
Her recent episode of vision loss , bilateral is concerning for migraine vs VBI given risk factors for CVA (tobacco) , orthostasis and brain MRI done May 2015 reporting  diffuse small vessel ischemic changes .  Advised to quit smoking and follow up with her neurologist .  MRA of brain ordered to rule out VBI

## 2014-12-14 ENCOUNTER — Encounter: Payer: Self-pay | Admitting: Internal Medicine

## 2014-12-19 NOTE — Telephone Encounter (Signed)
Message sent

## 2014-12-20 ENCOUNTER — Encounter: Payer: Self-pay | Admitting: Internal Medicine

## 2014-12-20 ENCOUNTER — Other Ambulatory Visit: Payer: Self-pay | Admitting: Internal Medicine

## 2014-12-24 ENCOUNTER — Ambulatory Visit: Payer: Self-pay | Admitting: Internal Medicine

## 2014-12-28 ENCOUNTER — Telehealth: Payer: Self-pay | Admitting: Internal Medicine

## 2014-12-28 ENCOUNTER — Other Ambulatory Visit: Payer: Self-pay | Admitting: Internal Medicine

## 2014-12-28 ENCOUNTER — Telehealth: Payer: Self-pay

## 2014-12-28 DIAGNOSIS — I6501 Occlusion and stenosis of right vertebral artery: Secondary | ICD-10-CM

## 2014-12-28 DIAGNOSIS — H539 Unspecified visual disturbance: Secondary | ICD-10-CM

## 2014-12-28 MED ORDER — ENOXAPARIN SODIUM 40 MG/0.4ML ~~LOC~~ SOLN: SUBCUTANEOUS | Status: DC

## 2014-12-28 NOTE — Assessment & Plan Note (Signed)
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 Will treat for prevention of embolic stroke,  Refer to [facility] for CTA and stent and refer to cardiology for ECHO

## 2014-12-28 NOTE — Telephone Encounter (Signed)
The patient called and is hoping to discuss her MRI report.  She stated she is concerned and is hoping for clarification.

## 2014-12-28 NOTE — Telephone Encounter (Signed)
Pt has read [employer], see messages.

## 2014-12-28 NOTE — Telephone Encounter (Signed)
My chart message sent recommending referral to Dr Fletcher Anon foer vertebral artery stnosis 75 to 90% on the right , by MRA brain

## 2014-12-28 NOTE — Telephone Encounter (Signed)
My chart messages sent to patient re vertebral artery stenosis,  Referral to [facility],  And need to start lovenox injections.  She may call the office to set up a nurse visit to help her get comfortable givening herslef the subQ injection .  Bonnita Nasuti , can you make the referral urgent?

## 2014-12-28 NOTE — Telephone Encounter (Signed)
Talked with patient and advised patient that the referral has ben placed and that Dr. Fletcher Anon would advise the next step if any required.

## 2014-12-29 ENCOUNTER — Ambulatory Visit (INDEPENDENT_AMBULATORY_CARE_PROVIDER_SITE_OTHER): Payer: Medicare Other | Admitting: *Deleted

## 2014-12-29 ENCOUNTER — Telehealth: Payer: Self-pay | Admitting: Internal Medicine

## 2014-12-29 DIAGNOSIS — I6501 Occlusion and stenosis of right vertebral artery: Secondary | ICD-10-CM

## 2014-12-29 NOTE — Progress Notes (Signed)
Pt presents for Lovenox administration teaching. Discussed dosing with patient, syringes have 60 mg, pt directions are for 50 mg. Discussed correct injection sites and need to rotate sites. Discussed not to rub injection area after administration. Observed pt clean site and let dry, prep syringe for 50 mg dosing. Observed pt use correct technique for subcutaneous injection. Pt tolerated without difficulty. Handout with step by step directions and pictures given to patient. Discussed every 12 hour dosing. Answered all patient's questions. Recommended to call office with any further questions. Verbalized understanding.

## 2014-12-29 NOTE — Telephone Encounter (Signed)
Arbie Cookey, pharmacist called to verify Lovenox dosage of 18m, states the syringes comes in 40 mg/0.458mor 60 mg/0.7m31m Spoke with Dr TulDerrel Nipo said to round up to 34m21mVerbal given to CaroArbie Cookeychange dosage to 34mg77mm 47mg.77m

## 2014-12-30 ENCOUNTER — Encounter: Payer: Self-pay | Admitting: Internal Medicine

## 2015-01-03 ENCOUNTER — Encounter: Payer: Self-pay | Admitting: Internal Medicine

## 2015-01-03 NOTE — Telephone Encounter (Signed)
Please advise 

## 2015-01-05 ENCOUNTER — Other Ambulatory Visit: Payer: Self-pay | Admitting: Internal Medicine

## 2015-01-07 ENCOUNTER — Encounter: Payer: Self-pay | Admitting: Internal Medicine

## 2015-01-09 ENCOUNTER — Other Ambulatory Visit: Payer: Self-pay | Admitting: Internal Medicine

## 2015-01-09 DIAGNOSIS — H539 Unspecified visual disturbance: Secondary | ICD-10-CM

## 2015-01-09 MED ORDER — NORTRIPTYLINE HCL 25 MG PO CAPS: ORAL_CAPSULE | ORAL | Status: DC

## 2015-01-09 NOTE — Assessment & Plan Note (Signed)
The location of the stenosis is not operable per Vascular Surgery,  Lovenox has been stopped and aspiring and tobacco abstinence has been advised by Dr. Lucky Cowboy.

## 2015-01-20 ENCOUNTER — Encounter: Payer: Self-pay | Admitting: Internal Medicine

## 2015-01-20 DIAGNOSIS — R42 Dizziness and giddiness: Secondary | ICD-10-CM

## 2015-01-23 NOTE — Telephone Encounter (Signed)
Referral is in process as requested

## 2015-01-26 ENCOUNTER — Encounter: Payer: Self-pay | Admitting: Internal Medicine

## 2015-01-26 ENCOUNTER — Other Ambulatory Visit: Payer: Self-pay | Admitting: Internal Medicine

## 2015-01-31 ENCOUNTER — Encounter: Payer: Self-pay | Admitting: Internal Medicine

## 2015-02-08 ENCOUNTER — Encounter: Payer: Self-pay | Admitting: Internal Medicine

## 2015-02-20 ENCOUNTER — Encounter: Payer: Self-pay | Admitting: Internal Medicine

## 2015-02-21 ENCOUNTER — Encounter: Payer: Self-pay | Admitting: Internal Medicine

## 2015-02-22 ENCOUNTER — Ambulatory Visit (INDEPENDENT_AMBULATORY_CARE_PROVIDER_SITE_OTHER): Payer: Medicare Other | Admitting: Internal Medicine

## 2015-02-22 ENCOUNTER — Encounter: Payer: Self-pay | Admitting: Internal Medicine

## 2015-02-22 VITALS — BP 98/54 | HR 86 | Temp 98.3°F | Resp 14 | Ht 61.0 in | Wt 110.0 lb

## 2015-02-22 DIAGNOSIS — I6501 Occlusion and stenosis of right vertebral artery: Secondary | ICD-10-CM | POA: Diagnosis not present

## 2015-02-22 DIAGNOSIS — G44229 Chronic tension-type headache, not intractable: Secondary | ICD-10-CM | POA: Diagnosis not present

## 2015-02-22 DIAGNOSIS — G453 Amaurosis fugax: Secondary | ICD-10-CM

## 2015-02-22 MED ORDER — HYDROCODONE-ACETAMINOPHEN 10-325 MG PO TABS: 1.0000 | ORAL_TABLET | Freq: Three times a day (TID) | ORAL | Status: DC | PRN

## 2015-02-22 NOTE — Patient Instructions (Signed)
Referral to Interventional Radiology is in process  To determine if your vertebral artery stenosis is severe enough to undergo revascularization   Please continue taking a full strength aspirin daily   You may use the vicodin every 8 hours for pain control  Keep you appointment with Dr Melrose Nakayama

## 2015-02-22 NOTE — Assessment & Plan Note (Addendum)
Difficult to say if these episodes are migrainous or due to VBI.  Vascular evaluation does not feel that the latter is the cause.  Patient is requesting a second opinion. Neurology referral is in process but will refer to INterventional radiology at [facility] for second opinion on degree of stenosis . Continue daily aspirin , tobacco abstinence and recommend statin therapy despite good HDL/LDL ratio

## 2015-02-22 NOTE — Progress Notes (Signed)
Pre-visit discussion using our clinic review tool. No additional management support is needed unless otherwise documented below in the visit note.  

## 2015-02-22 NOTE — Progress Notes (Signed)
Patient ID: Tamara [name], female   DOB: Dec 24, 1945, 69 y.o.   MRN: [id number] Patient Active Problem List   Diagnosis Date Noted  . Orthostasis 12/13/2014  . Amaurosis fugax 12/13/2014  . Uric acid arthropathy 10/08/2014  . Headache, chronic migraine without aura 09/10/2014  . Chronic headache 03/11/2014  . Carpal tunnel syndrome 03/08/2014  . Feeling weak 03/08/2014  . Cough 03/08/2014  . Tobacco abuse counseling 01/26/2014  . B12 deficiency 01/26/2014  . Tobacco abuse 01/26/2014  . Routine general medical examination at a health care facility 05/11/2013  . Chest pain 05/11/2013  . Osteoporosis, unspecified 11/19/2011  . Chronic abdominal pain   . H/O small bowel obstruction   . Crohn's disease   . History of DVT (deep vein thrombosis) 01/10/2007    Subjective:  CC:   Chief Complaint  Patient presents with  . Headache    since 02/19/15 eyes senitive to light and head hurt across top of head.    HPI:   Tamara [name] is a 69 y.o. female who presents for  Persistent headaches.  Patient has currently had a headache across top of her head spanning from from ear to ear since April 10,   Quality is tight , like a vice, and pulsating at times.  Accompanied  by photosensitivity and nausea.  Pain is persistent, eases up with use of vicodin 10/325 for about an hour.  Has been using zofran .  The headache began with an episode of transient binocular visual loss which lasted 25-30 minutes before resolving spontaneously.  This is her second occurrence of amaursis fugax. Her initial episode occurred last month and prompted  Imaging with MRI/MRA brain which noted chronic small vessel disease and right nondominant vertebral artery stenosis, followed by a vascular consult by [facility], who continued the evaluation with a carotid ultrasound which noted 1-49% stenosis , antegrade flow in the vertebrals, and no evidence of subclavian stenosis.  She has not seen neurology yet. She continues to  report recurrent dizziness as well, but has chronic hypotension and Crohn's Disease.  She has a long history of tobacco abuse but stopped smoking last month when the MRI was done. She has had cardiology evalution in the past month for chest pain with a stress ECHO which was normal except for trivial mitral and tricuspid regurgitation.    Past Medical History  Diagnosis Date  . Osteoporosis   . Chronic abdominal pain   . Crohn's disease   . History of DVT (deep vein thrombosis) 01/2007    post operative right leg, s/p vena cava filter  . Bowel obstruction     s/p laparotomy/ LOA 3/08, prior reversal of jejunal bypasss 2004    Past Surgical History  Procedure Laterality Date  . Cholecystectomy    . Cervical fusion  2005    C6-7, anterior approach  . 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 Rotator cuff repair      right  . Appendectomy    . Gyn surgery      hysterectomy  . Laparotomy  01/30/07    for bowel obstruction with LOA  . Cataract extraction    . Nissen fundoplication    . Abdominal hysterectomy         The following portions of the patient's history were reviewed and updated as appropriate: Allergies, current medications, and problem list.    Review of Systems:   Patient denies , fevers, malaise, unintentional weight loss,  skin rash, eye pain, sinus congestion and sinus pain, sore throat, dysphagia,  hemoptysis , cough, dyspnea, wheezing, chest pain, palpitations, orthopnea, edema, abdominal pain, nausea, melena, diarrhea, constipation, flank pain, dysuria, hematuria, urinary  Frequency, nocturia, numbness, tingling, seizures,  Focal weakness, Loss of consciousness,  Tremor, insomnia, depression, anxiety, and suicidal ideation.     History   Social History  . Marital Status: Married    Spouse Name: N/A  . Number of Children: N/A  . Years of Education: N/A   Occupational History  . Not on file.   Social History Main Topics  . Smoking status:  Former Smoker    Quit date: 12/13/2014  . Smokeless tobacco: Not on file     Comment: half to one pack of cigs per day  . Alcohol Use: No  . Drug Use: Not on file  . Sexual Activity: Not on file   Other Topics Concern  . Not on file   Social History Narrative   Lives with spouse.    Objective:  Filed Vitals:   02/22/15 1448  BP: 98/54  Pulse: 86  Temp: 98.3 F (36.8 C)  Resp: 14     General appearance: alert, cooperative and appears stated age Ears: normal TM's and external ear canals both ears Throat: lips, mucosa, and tongue normal; teeth and gums normal Neck: no adenopathy, no carotid bruit, supple, symmetrical, trachea midline and thyroid not enlarged, symmetric, no tenderness/mass/nodules Back: symmetric, no curvature. ROM normal. No CVA tenderness. Lungs: clear to auscultation bilaterally Heart: regular rate and rhythm, S1, S2 normal, no murmur, click, rub or gallop Abdomen: soft, non-tender; bowel sounds normal; no masses,  no organomegaly Pulses: 2+ and symmetric Skin: Skin color, texture, turgor normal. No rashes or lesions Lymph nodes: Cervical, supraclavicular, and axillary nodes normal. Neuro: CNs 2-12 intact. DTRs 2+/4 in biceps, brachioradialis, patellars and achilles. Muscle strength 5/5 in upper and lower exremities. Fine resting tremor bilaterally both hands cerebellar function normal. Romberg negative.  No pronator drift.   Gait normal.  Psych: affect normal, makes good eye contact. No fidgeting,  Smiles easily.  Denies suicidal thoughts    Assessment and Plan:  Amaurosis fugax Difficult to say if these episodes are migrainous or due to VBI.  Vascular evaluation does not feel that the latter is the cause.  Patient is requesting a second opinion. Neurology referral is in process but will refer to INterventional radiology at [facility] for second opinion on degree of stenosis . Continue daily aspirin , tobacco abstinence and recommend statin therapy  despite good HDL/LDL ratio    Chronic headache Persistent despite regular use of pamelor.  Will repeat Neurology eval with Lyndel Pleasure , last seen in May 2015   A total of 40 minutes was spent with patient more than half of which was spent in counseling patient on the above mentioned issues , reviewing and explaining recent labs and imaging studies done, and coordination of care.  Updated Medication List Outpatient Encounter Prescriptions as of 02/22/2015  Medication Sig  . budesonide (ENTOCORT EC) 3 MG 24 hr capsule Take one by mouth three times daily   . buPROPion (WELLBUTRIN XL) 150 MG 24 hr tablet Take 1 tablet (150 mg total) by mouth daily.  . clobetasol ointment (TEMOVATE) 0.05 % Apply topically 2 (two) times daily. To labia For two to three weeks,  Then reduce use to twice per week  . clonazePAM (KLONOPIN) 0.5 MG tablet TAKE 1 TABLET BY MOUTH ONCE DAILY AS NEEDED  FOR ANXIETY.  . cyanocobalamin (,VITAMIN B-12,) 1000 MCG/ML injection 1000 mcg IM injection weekly  . ergocalciferol (DRISDOL) 50000 UNITS capsule Take 1 capsule (50,000 Units total) by mouth once a week.  . fluticasone (FLONASE) 50 MCG/ACT nasal spray 2 sprays in each nostril once daily  . HYDROcodone-acetaminophen (NORCO) 10-325 MG per tablet Take 1 tablet by mouth every 8 (eight) hours as needed.  . indomethacin (INDOCIN SR) 75 MG CR capsule Take 1 capsule (75 mg total) by mouth 2 (two) times daily with a meal.  . mesalamine (PENTASA) 250 MG CR capsule Take 250 mg by mouth 3 (three) times daily.    . nortriptyline (PAMELOR) 25 MG capsule TAKE 1 CAPSULE BY MOUTH EACH NIGHT AT BEDTIME.  Marland Kitchen omeprazole (PRILOSEC) 20 MG capsule Take 2 by mouth daily   . promethazine (PHENERGAN) 25 MG tablet Take one by mouth as needed   . sertraline (ZOLOFT) 100 MG tablet Take 1 tablet (100 mg total) by mouth daily.  . Syringe, Disposable, 3 ML MISC For use with B12 injections weekly/monthly  . traZODone (DESYREL) 50 MG tablet TAKE 1/2 TO 1  TABLET BY MOUTH AT BEDTIMEAS NEEDED FOR SLEEP  . zoledronic acid (RECLAST) 5 MG/100ML SOLN Inject 5 mg into the vein once. yearly   . [DISCONTINUED] HYDROcodone-acetaminophen (NORCO) 10-325 MG per tablet Take 1 tablet by mouth every 8 (eight) hours as needed.     Orders Placed This Encounter  Procedures  . Ambulatory referral to Interventional Radiology    No Follow-up on file.

## 2015-02-23 ENCOUNTER — Telehealth: Payer: Self-pay | Admitting: Internal Medicine

## 2015-02-23 NOTE — Telephone Encounter (Signed)
Talked with Amy at [facility] they are to fax over notes from 01/06/15 ASAP. FYI

## 2015-02-23 NOTE — Assessment & Plan Note (Signed)
Persistent despite regular use of pamelor.  Will repeat Neurology eval with Tamara [name] , last seen in May 2015

## 2015-03-01 ENCOUNTER — Encounter: Payer: Self-pay | Admitting: Internal Medicine

## 2015-03-02 ENCOUNTER — Telehealth: Payer: Self-pay | Admitting: Internal Medicine

## 2015-03-02 NOTE — Telephone Encounter (Signed)
Added medication from [facility] Chart message from patient forwarded to MD for [place].

## 2015-03-02 NOTE — Telephone Encounter (Signed)
I have Updated med list with new meds this FYI

## 2015-03-08 ENCOUNTER — Encounter: Payer: Self-pay | Admitting: Internal Medicine

## 2015-03-08 ENCOUNTER — Telehealth (HOSPITAL_COMMUNITY): Payer: Self-pay | Admitting: Interventional Radiology

## 2015-03-08 ENCOUNTER — Other Ambulatory Visit: Payer: Self-pay | Admitting: Internal Medicine

## 2015-03-08 ENCOUNTER — Telehealth: Payer: Self-pay | Admitting: Internal Medicine

## 2015-03-08 NOTE — Telephone Encounter (Signed)
Called pt to schedule consult for new pt referred by Deborra Medina. No answer, no VM. JM

## 2015-03-08 NOTE — Telephone Encounter (Signed)
Placed in red folder need order instead of referral for interventional radiology.

## 2015-03-08 NOTE — Telephone Encounter (Signed)
Tamara [name],  Please let me know if this is not resolved today ,  This is ridiculous

## 2015-03-12 ENCOUNTER — Encounter: Payer: Self-pay | Admitting: Internal Medicine

## 2015-03-14 ENCOUNTER — Encounter (INDEPENDENT_AMBULATORY_CARE_PROVIDER_SITE_OTHER): Payer: Self-pay

## 2015-03-14 ENCOUNTER — Encounter: Payer: Self-pay | Admitting: Emergency Medicine

## 2015-03-14 ENCOUNTER — Emergency Department
Admission: EM | Admit: 2015-03-14 | Discharge: 2015-03-14 | Disposition: A | Discharge status: Home / Self Care | Payer: Medicare Other | Attending: Emergency Medicine | Admitting: Emergency Medicine

## 2015-03-14 ENCOUNTER — Other Ambulatory Visit (HOSPITAL_COMMUNITY): Payer: Self-pay | Admitting: Interventional Radiology

## 2015-03-14 ENCOUNTER — Emergency Department: Payer: Medicare Other

## 2015-03-14 DIAGNOSIS — Y9389 Activity, other specified: Secondary | ICD-10-CM | POA: Diagnosis not present

## 2015-03-14 DIAGNOSIS — Z87891 Personal history of nicotine dependence: Secondary | ICD-10-CM | POA: Insufficient documentation

## 2015-03-14 DIAGNOSIS — Z7951 Long term (current) use of inhaled steroids: Secondary | ICD-10-CM | POA: Insufficient documentation

## 2015-03-14 DIAGNOSIS — Y9289 Other specified places as the place of occurrence of the external cause: Secondary | ICD-10-CM | POA: Diagnosis not present

## 2015-03-14 DIAGNOSIS — Z79899 Other long term (current) drug therapy: Secondary | ICD-10-CM | POA: Diagnosis not present

## 2015-03-14 DIAGNOSIS — S60221A Contusion of right hand, initial encounter: Secondary | ICD-10-CM | POA: Diagnosis not present

## 2015-03-14 DIAGNOSIS — S6991XA Unspecified injury of right wrist, hand and finger(s), initial encounter: Secondary | ICD-10-CM | POA: Diagnosis present

## 2015-03-14 DIAGNOSIS — I771 Stricture of artery: Secondary | ICD-10-CM

## 2015-03-14 DIAGNOSIS — W208XXA Other cause of strike by thrown, projected or falling object, initial encounter: Secondary | ICD-10-CM | POA: Diagnosis not present

## 2015-03-14 DIAGNOSIS — Y998 Other external cause status: Secondary | ICD-10-CM | POA: Diagnosis not present

## 2015-03-14 DIAGNOSIS — Z7902 Long term (current) use of antithrombotics/antiplatelets: Secondary | ICD-10-CM | POA: Diagnosis not present

## 2015-03-14 MED ORDER — IBUPROFEN 800 MG PO TABS: 800.0000 mg | ORAL_TABLET | Freq: Three times a day (TID) | ORAL | Status: DC | PRN

## 2015-03-14 MED ORDER — HYDROCODONE-ACETAMINOPHEN 10-325 MG PO TABS: 1.0000 | ORAL_TABLET | Freq: Four times a day (QID) | ORAL | Status: DC | PRN

## 2015-03-14 NOTE — Discharge Instructions (Signed)
Blunt Trauma You have been evaluated for injuries. You have been examined and your caregiver has not found injuries serious enough to require hospitalization. It is common to have multiple bruises and sore muscles following an accident. These tend to feel worse for the first 24 hours. You will feel more stiffness and soreness over the next several hours and worse when you wake up the first morning after your accident. After this point, you should begin to improve with each passing day. The amount of improvement depends on the amount of damage done in the accident. Following your accident, if some part of your body does not work as it should, or if the pain in any area continues to increase, you should return to the Emergency Department for re-evaluation.  HOME CARE INSTRUCTIONS  Routine care for sore areas should include:  Ice to sore areas every 2 hours for 20 minutes while awake for the next 2 days.  Drink extra fluids (not alcohol).  Take a hot or warm shower or bath once or twice a day to increase blood flow to sore muscles. This will help you "limber up".  Activity as tolerated. Lifting may aggravate neck or back pain.  Only take over-the-counter or prescription medicines for pain, discomfort, or fever as directed by your caregiver. Do not use aspirin. This may increase bruising or increase bleeding if there are small areas where this is happening. SEEK IMMEDIATE MEDICAL CARE IF:  Numbness, tingling, weakness, or problem with the use of your arms or legs.  A severe headache is not relieved with medications.  There is a change in bowel or bladder control.  Increasing pain in any areas of the body.  Short of breath or dizzy.  Nauseated, vomiting, or sweating.  Increasing belly (abdominal) discomfort.  Blood in urine, stool, or vomiting blood.  Pain in either shoulder in an area where a shoulder strap would be.  Feelings of lightheadedness or if you have a fainting  episode. Sometimes it is not possible to identify all injuries immediately after the trauma. It is important that you continue to monitor your condition after the emergency department visit. If you feel you are not improving, or improving more slowly than should be expected, call your physician. If you feel your symptoms (problems) are worsening, return to the Emergency Department immediately. Document Released: 07/24/2001 Document Revised: 01/20/2012 Document Reviewed: 06/15/2008 [facility] Patient Information 2015 [place], [place]. This information is not intended to replace advice given to you by your health care provider. Make sure you discuss any questions you have with your health care provider.  Wear ace wrap as needed for comfort.

## 2015-03-14 NOTE — ED Notes (Signed)
Pt here with swelling and bruising to her right hand. Pt states that she had a piece on wood fall on it last night. Pt states that she has had a lot of swelling, it has went down some with ice. The area is very bruised.  Pt is able to move all of her fingers, but states that it is very painful and burning. Pt is in NAD at this time will cont to monitor pt at all times.

## 2015-03-14 NOTE — ED Notes (Signed)
Pt was working last night and a piece of fence fell on her right hand. Her hand is swollen and very bruised.

## 2015-03-15 ENCOUNTER — Encounter: Payer: Self-pay | Admitting: Physician Assistant

## 2015-03-15 NOTE — ED Provider Notes (Signed)
[facility] Emergency Department Provider Note    ____________________________________________  Time seen: ----------------------------------------- 1215 PM on 03/14/2015 -----------------------------------------    I have reviewed the triage vital signs and the nursing notes.   HISTORY  Chief Complaint Hand Injury       HPI Tamara [name] is a 69 y.o. female resents to the ER with left hand contusion. Patient states this occurred on 03/13/2015 when a piece of wood fell on the back of her hand. Patient states that she has bruising noted to her hand with painful movement. Reports she is able to move all fingers upon arrival. Patient rates pain 7 out of 10. Has tried ice to help with the swelling.    Past Medical History  Diagnosis Date  . Osteoporosis   . Chronic abdominal pain   . Crohn's disease   . History of DVT (deep vein thrombosis) 01/2007    post operative right leg, s/p vena cava filter  . Bowel obstruction     s/p laparotomy/ LOA 3/08, prior reversal of jejunal bypasss 2004    Patient Active Problem List   Diagnosis Date Noted  . Orthostasis 12/13/2014  . Amaurosis fugax 12/13/2014  . Uric acid arthropathy 10/08/2014  . Headache, chronic migraine without aura 09/10/2014  . Chronic headache 03/11/2014  . Carpal tunnel syndrome 03/08/2014  . Feeling weak 03/08/2014  . Cough 03/08/2014  . Tobacco abuse counseling 01/26/2014  . B12 deficiency 01/26/2014  . Tobacco abuse 01/26/2014  . Routine general medical examination at a health care facility 05/11/2013  . Chest pain 05/11/2013  . Osteoporosis, unspecified 11/19/2011  . Chronic abdominal pain   . H/O small bowel obstruction   . Crohn's disease   . History of DVT (deep vein thrombosis) 01/10/2007    Past Surgical History  Procedure Laterality Date  . Cholecystectomy    . Cervical fusion  2005    C6-7, anterior approach  . Splenectomy      secondary to colonoscopy prep   . Rotator cuff repair      right  . Appendectomy    . Gyn surgery      hysterectomy  . Laparotomy  01/30/07    for bowel obstruction with LOA  . Cataract extraction    . Nissen fundoplication    . Abdominal hysterectomy      Current Outpatient Rx  Name  Route  Sig  Dispense  Refill  . budesonide (ENTOCORT EC) 3 MG 24 hr capsule      Take one by mouth three times daily          . buPROPion (WELLBUTRIN XL) 150 MG 24 hr tablet   Oral   Take 1 tablet (150 mg total) by mouth daily.   30 tablet   2   . clobetasol ointment (TEMOVATE) 0.05 %   Topical   Apply topically 2 (two) times daily. To labia For two to three weeks,  Then reduce use to twice per week   30 g   1   . clonazePAM (KLONOPIN) 0.5 MG tablet      TAKE 1 TABLET BY MOUTH ONCE DAILY AS NEEDED FOR ANXIETY.   30 tablet   4   . clopidogrel (PLAVIX) 75 MG tablet   Oral   Take 75 mg by mouth daily.         . cyanocobalamin (,VITAMIN B-12,) 1000 MCG/ML injection      1000 mcg IM injection weekly   10 mL  3   . ergocalciferol (DRISDOL) 50000 UNITS capsule   Oral   Take 1 capsule (50,000 Units total) by mouth once a week.   12 capsule   0   . fluticasone (FLONASE) 50 MCG/ACT nasal spray      2 sprays in each nostril once daily   16 g   6   . gabapentin (NEURONTIN) 100 MG capsule   Oral   Take 100 mg by mouth 3 (three) times daily.         Marland Kitchen HYDROcodone-acetaminophen (NORCO) 10-325 MG per tablet   Oral   Take 1 tablet by mouth every 6 (six) hours as needed.   15 tablet   0   . ibuprofen (ADVIL,MOTRIN) 800 MG tablet   Oral   Take 1 tablet (800 mg total) by mouth every 8 (eight) hours as needed.   30 tablet   0   . indomethacin (INDOCIN SR) 75 MG CR capsule   Oral   Take 1 capsule (75 mg total) by mouth 2 (two) times daily with a meal.   14 capsule   1   . mesalamine (PENTASA) 250 MG CR capsule   Oral   Take 250 mg by mouth 3 (three) times daily.           . nortriptyline  (PAMELOR) 25 MG capsule      TAKE 1 CAPSULE BY MOUTH EACH NIGHT AT BEDTIME.   90 capsule   3   . omeprazole (PRILOSEC) 20 MG capsule      Take 2 by mouth daily          . promethazine (PHENERGAN) 25 MG tablet      Take one by mouth as needed          . sertraline (ZOLOFT) 100 MG tablet   Oral   Take 1 tablet (100 mg total) by mouth daily.   30 tablet   6   . Syringe, Disposable, 3 ML MISC      For use with B12 injections weekly/monthly   25 each   0   . traZODone (DESYREL) 50 MG tablet      TAKE 1/2 TO 1 TABLET BY MOUTH AT BEDTIMEAS NEEDED FOR SLEEP   90 tablet   0   . zoledronic acid (RECLAST) 5 MG/100ML SOLN   Intravenous   Inject 5 mg into the vein once. yearly            Allergies Sulfa antibiotics  Family History  Problem Relation Age of Onset  . Diabetes Mother     type 2  . Heart disease Mother   . Hypertension Mother   . Coronary artery disease Father     Social History History  Substance Use Topics  . Smoking status: Former Smoker    Quit date: 12/13/2014  . Smokeless tobacco: Not on file     Comment: half to one pack of cigs per day  . Alcohol Use: No    Review of Systems  Constitutional: Negative for fever. Cardiovascular: Negative for chest pain. Respiratory: Negative for shortness of breath. Skin: Negative for rash. Denies laceration   10-point ROS otherwise negative.  ____________________________________________   PHYSICAL EXAM:  VITAL SIGNS: ED Triage Vitals  Enc Vitals Group     BP 03/14/15 1218 121/65 mmHg     Pulse Rate 03/14/15 1218 102     Resp --      Temp 03/14/15 1218 98 F (36.7 C)  Temp Source 03/14/15 1218 Oral     SpO2 03/14/15 1218 97 %     Weight 03/14/15 1218 110 lb (49.896 kg)     Height 03/14/15 1218 5' 1"  (1.549 m)     Head Cir --      Peak Flow --      Pain Score 03/14/15 1219 7     Pain Loc --      Pain Edu? --      Excl. in [place]? --      Constitutional: Alert and oriented. Well  appearing and in no distress. Cardiovascular: Normal rate, regular rhythm. Normal and symmetric distal pulses are present in all extremities. No murmurs, rubs, or gallops. Respiratory: Normal respiratory effort without tachypnea nor retractions. Breath sounds are clear and equal bilaterally. No wheezes/rales/rhonchi. Musculoskeletal: Nontender with normal range of motion in all extremities. No joint effusions.  No lower extremity tenderness nor edema. Ecchymosis and edema noted to the dorsum of left hand pain to clear. Face of the third and fourth digits. Neurologic:  Normal speech and language. No gross focal neurologic deficits are appreciated. Speech is normal. No gait instability. Skin:  Skin is warm, dry and intact. No rash noted. Psychiatric: Mood and affect are normal. Speech and behavior are normal. Patient exhibits appropriate insight and judgment.  ____________________________________________    LABS (pertinent positives/negatives)  Not applicable  ____________________________________________   EKG  Not Applicable  ____________________________________________    RADIOLOGY  CLINICAL DATA: Right hand pain, right hand injury yesterday, third and fifth metacarpal pain  EXAM: RIGHT HAND - COMPLETE 3+ VIEW  COMPARISON: None.  FINDINGS: Three views of the right hand submitted. No acute fracture or subluxation. Mild soft tissue swelling noted dorsal metacarpal region. Mild degenerative changes distal interphalangeal joints. Mild narrowing of radiocarpal joint space.  IMPRESSION: No acute fracture or subluxation. Mild degenerative changes.   Electronically Signed  By: Lahoma Crocker M.D.  On: 03/14/2015 13:24  ____________________________________________   PROCEDURES  Procedure(s) performed: None  Critical Care performed: No  ____________________________________________   INITIAL IMPRESSION / ASSESSMENT AND PLAN / ED COURSE  Pertinent labs &  imaging results that were available during my care of the patient were reviewed by me and considered in my medical decision making (see chart for details).  Discussed radiological findings with patient will wrap patient's hand and follow-up as needed. No other EMC complaints at this time.  ____________________________________________   FINAL CLINICAL IMPRESSION(S) / ED DIAGNOSES  Final diagnoses:  Hand contusion, right, initial encounter     Arlyss Repress, PA-C 03/15/15 1832  Carrie Mew, MD 03/16/15 [phone]

## 2015-03-16 ENCOUNTER — Encounter: Payer: Self-pay | Admitting: Internal Medicine

## 2015-03-20 ENCOUNTER — Other Ambulatory Visit (HOSPITAL_COMMUNITY): Payer: Self-pay | Admitting: Interventional Radiology

## 2015-03-20 ENCOUNTER — Ambulatory Visit (HOSPITAL_COMMUNITY)
Admission: RE | Admit: 2015-03-20 | Discharge: 2015-03-20 | Disposition: A | Discharge status: Home / Self Care | Payer: Medicare Other | Source: Ambulatory Visit | Attending: Interventional Radiology | Admitting: Interventional Radiology

## 2015-03-20 DIAGNOSIS — I771 Stricture of artery: Secondary | ICD-10-CM

## 2015-03-23 ENCOUNTER — Other Ambulatory Visit: Payer: Self-pay | Admitting: Radiology

## 2015-03-24 ENCOUNTER — Encounter (HOSPITAL_COMMUNITY): Payer: Self-pay

## 2015-03-24 ENCOUNTER — Other Ambulatory Visit (HOSPITAL_COMMUNITY): Payer: Self-pay | Admitting: Interventional Radiology

## 2015-03-24 ENCOUNTER — Observation Stay (HOSPITAL_COMMUNITY)
Admission: EM | Admit: 2015-03-24 | Discharge: 2015-03-25 | Disposition: A | Discharge status: Home / Self Care | Payer: Medicare Other | Source: Ambulatory Visit | Attending: Interventional Radiology | Admitting: Interventional Radiology

## 2015-03-24 VITALS — BP 124/62 | HR 92 | Temp 98.4°F | Resp 16 | Ht 61.0 in | Wt 113.5 lb

## 2015-03-24 DIAGNOSIS — I771 Stricture of artery: Secondary | ICD-10-CM | POA: Diagnosis present

## 2015-03-24 DIAGNOSIS — K5090 Crohn's disease, unspecified, without complications: Secondary | ICD-10-CM | POA: Insufficient documentation

## 2015-03-24 DIAGNOSIS — T148XXA Other injury of unspecified body region, initial encounter: Secondary | ICD-10-CM

## 2015-03-24 DIAGNOSIS — Z7902 Long term (current) use of antithrombotics/antiplatelets: Secondary | ICD-10-CM | POA: Insufficient documentation

## 2015-03-24 DIAGNOSIS — Z87891 Personal history of nicotine dependence: Secondary | ICD-10-CM | POA: Insufficient documentation

## 2015-03-24 DIAGNOSIS — M1990 Unspecified osteoarthritis, unspecified site: Secondary | ICD-10-CM | POA: Diagnosis not present

## 2015-03-24 DIAGNOSIS — G450 Vertebro-basilar artery syndrome: Principal | ICD-10-CM | POA: Insufficient documentation

## 2015-03-24 LAB — CBC
HEMATOCRIT: 40.3 % (ref 36.0–46.0)
HEMOGLOBIN: 13.2 g/dL (ref 12.0–15.0)
MCH: 32.8 pg (ref 26.0–34.0)
MCHC: 32.8 g/dL (ref 30.0–36.0)
MCV: 100 fL (ref 78.0–100.0)
Platelets: 316 10*3/uL (ref 150–400)
RBC: 4.03 MIL/uL (ref 3.87–5.11)
RDW: 13.3 % (ref 11.5–15.5)
WBC: 8.9 10*3/uL (ref 4.0–10.5)

## 2015-03-24 LAB — BASIC METABOLIC PANEL
Anion gap: 8 (ref 5–15)
BUN: 14 mg/dL (ref 6–20)
CO2: 25 mmol/L (ref 22–32)
Calcium: 9.2 mg/dL (ref 8.9–10.3)
Chloride: 107 mmol/L (ref 101–111)
Creatinine, Ser: 0.77 mg/dL (ref 0.44–1.00)
GFR calc non Af Amer: >60 mL/min (ref >60)
GLUCOSE: 98 mg/dL (ref 65–99)
POTASSIUM: 4.1 mmol/L (ref 3.5–5.1)
Sodium: 140 mmol/L (ref 135–145)

## 2015-03-24 LAB — PROTIME-INR
INR: 0.94 (ref 0.00–1.49)
INR: 0.96 (ref 0.00–1.49)
PROTHROMBIN TIME: 12.6 s (ref 11.6–15.2)
PROTHROMBIN TIME: 12.9 s (ref 11.6–15.2)

## 2015-03-24 LAB — PLATELET INHIBITION P2Y12: PLATELET FUNCTION P2Y12: 40 [PRU] — AB (ref 194–418)

## 2015-03-24 LAB — APTT
aPTT: 29 seconds (ref 24–37)
aPTT: 29 seconds (ref 24–37)

## 2015-03-24 MED ORDER — FENTANYL CITRATE (PF) 100 MCG/2ML IJ SOLN
INTRAMUSCULAR | Status: AC | PRN
  Administered 2015-03-24 (×3): 25 ug via INTRAVENOUS

## 2015-03-24 MED ORDER — SODIUM CHLORIDE 0.9 % IV SOLN
INTRAVENOUS | Status: AC | PRN
  Administered 2015-03-24: 50 mL/h via INTRAVENOUS

## 2015-03-24 MED ORDER — TRAZODONE HCL 50 MG PO TABS
25.0000 mg | ORAL_TABLET | Freq: Once | ORAL | Status: AC
  Administered 2015-03-24: 25 mg via ORAL
  Filled 2015-03-24: qty 1

## 2015-03-24 MED ORDER — HEPARIN SOD (PORK) LOCK FLUSH 100 UNIT/ML IV SOLN
INTRAVENOUS | Status: AC
  Filled 2015-03-24: qty 20

## 2015-03-24 MED ORDER — HEPARIN SODIUM (PORCINE) 1000 UNIT/ML IJ SOLN
INTRAMUSCULAR | Status: AC | PRN
  Administered 2015-03-24: 1000 [IU] via INTRAVENOUS

## 2015-03-24 MED ORDER — MIDAZOLAM HCL 2 MG/2ML IJ SOLN
INTRAMUSCULAR | Status: AC
  Filled 2015-03-24: qty 2

## 2015-03-24 MED ORDER — FENTANYL CITRATE (PF) 100 MCG/2ML IJ SOLN
INTRAMUSCULAR | Status: AC
  Filled 2015-03-24: qty 2

## 2015-03-24 MED ORDER — SODIUM CHLORIDE 0.9 % IV SOLN
Freq: Once | INTRAVENOUS | Status: AC
  Administered 2015-03-24: 11:00:00 via INTRAVENOUS

## 2015-03-24 MED ORDER — LIDOCAINE HCL 1 % IJ SOLN
INTRAMUSCULAR | Status: AC
  Filled 2015-03-24: qty 20

## 2015-03-24 MED ORDER — MIDAZOLAM HCL 2 MG/2ML IJ SOLN
INTRAMUSCULAR | Status: AC | PRN
  Administered 2015-03-24: 1 mg via INTRAVENOUS

## 2015-03-24 MED ORDER — SODIUM CHLORIDE 0.9 % IV SOLN: INTRAVENOUS | Status: AC

## 2015-03-24 MED ORDER — IOHEXOL 300 MG/ML  SOLN
150.0000 mL | Freq: Once | INTRAMUSCULAR | Status: AC | PRN
  Administered 2015-03-24: 80 mL via INTRA_ARTERIAL

## 2015-03-24 NOTE — Discharge Instructions (Signed)
Angiogram, Care After Refer to this sheet in the next few weeks. These instructions provide you with information on caring for yourself after your procedure. Your health care provider may also give you more specific instructions. Your treatment has been planned according to current medical practices, but problems sometimes occur. Call your health care provider if you have any problems or questions after your procedure.  WHAT TO EXPECT AFTER THE PROCEDURE After your procedure, it is typical to have the following sensations:  Minor discomfort or tenderness and a small bump at the catheter insertion site. The bump should usually decrease in size and tenderness within 1 to 2 weeks.  Any bruising will usually fade within 2 to 4 weeks. HOME CARE INSTRUCTIONS   You may need to keep taking blood thinners if they were prescribed for you. Take medicines only as directed by your health care provider.  Do not apply powder or lotion to the site.  Do not take baths, swim, or use a hot tub until your health care provider approves.  You may shower 24 hours after the procedure. Remove the bandage (dressing) and gently wash the site with plain soap and water. Gently pat the site dry.  Inspect the site at least twice daily.  Limit your activity for the first 48 hours. Do not bend, squat, or lift anything over 20 lb (9 kg) or as directed by your health care provider.  Plan to have someone take you home after the procedure. Follow instructions about when you can drive or return to work. SEEK MEDICAL CARE IF:  You get light-headed when standing up.  You have drainage (other than a small amount of blood on the dressing).  You have chills.  You have a fever.  You have redness, warmth, swelling, or pain at the insertion site. SEEK IMMEDIATE MEDICAL CARE IF:   You develop chest pain or shortness of breath, feel faint, or pass out.  You have bleeding, swelling larger than a walnut, or drainage from the  catheter insertion site.  You develop pain, discoloration, coldness, or severe bruising in the leg or arm that held the catheter.  You develop bleeding from any other place, such as the bowels. You may see bright red blood in your urine or stools, or your stools may appear black and tarry.  You have heavy bleeding from the site. If this happens, hold pressure on the site. CALL 911 MAKE SURE YOU:  Understand these instructions.  Will watch your condition.  Will get help right away if you are not doing well or get worse. Document Released: 05/16/2005 Document Revised: 03/14/2014 Document Reviewed: 03/22/2013 [facility] Patient Information 2015 [place], [place]. This information is not intended to replace advice given to you by your health care provider. Make sure you discuss any questions you have with your health care provider.

## 2015-03-24 NOTE — Progress Notes (Signed)
Heather IR tech stopped bleeding after 15 minutes of pressure/ pressure dressing applied and 10 lb sandbag placed over the groin. Pt to be admitted to 6C02. Pt cautioned to lay flat/ still. Family waiting in holding area.

## 2015-03-24 NOTE — Progress Notes (Signed)
Patient with small ooze at [facility] dressing site, RLE warm and soft with no palpable hematoma, PT palpable b/l, cap refill intact b/l. Informed Dr. Estanislado Pandy and IR will hold pressure and patient to remain flat with head down for 2 hours.   Tsosie Billing PA-C Interventional Radiology  03/24/15  4:17 PM

## 2015-03-24 NOTE — Progress Notes (Signed)
Pt c/o right leg pain, foot coldness 7/10. Right groin level 0, feet cool to touch bilateral, right pt +1,  Dr/PA Levan Hurst called and arrived for assessment.  Warm blankets applied to feet.

## 2015-03-24 NOTE — Procedures (Signed)
S/P  4 vessel cerebral arteriogram. RT CFa approach. Findings. 1.No occlusions,stenosis ,dissections or intraluminal defects seen. 2.No aneurysms or AV shunting noted. 3.Venous outflow WNLs

## 2015-03-24 NOTE — Sedation Documentation (Signed)
Review groin precautions with pt and family deny questions

## 2015-03-24 NOTE — H&P (Signed)
Chief Complaint: Dizziness; gait instability Abnormal MRI/MRA Brain  Referring Physician(s): Dr Deborra Medina  History of Present Illness: Tamara [name] is a 69 y.o. female   Pt developed headache; dizziness; gait instability approx 2 mo ago Blindness lasted  x 1 hr +associated  N/V  All resolved and was evaluated by PMD MRI/MRA Brain 12/2014 revealed R Vertebrobasilar junction stenosis Placed on ASA/Plavix No complaints since then Stopped smoking Consulted with Dr Estanislado Pandy 03/22/2015 Now scheduled for cerebral arteriogram   Past Medical History  Diagnosis Date  . Osteoporosis   . Chronic abdominal pain   . Crohn's disease   . History of DVT (deep vein thrombosis) 01/2007    post operative right leg, s/p vena cava filter  . Bowel obstruction     s/p laparotomy/ LOA 3/08, prior reversal of jejunal bypasss 2004    Past Surgical History  Procedure Laterality Date  . Cholecystectomy    . Cervical fusion  2005    C6-7, anterior approach  . 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 Rotator cuff repair      right  . Appendectomy    . Gyn surgery      hysterectomy  . Laparotomy  01/30/07    for bowel obstruction with LOA  . Cataract extraction    . Nissen fundoplication    . Abdominal hysterectomy      Allergies: Sulfa antibiotics  Medications: Prior to Admission medications   Medication Sig Start Date End Date Taking? Authorizing Provider  clonazePAM (KLONOPIN) 0.5 MG tablet TAKE 1 TABLET BY MOUTH ONCE DAILY AS NEEDED FOR ANXIETY. Patient taking differently: Take 0.17m by mouth at night 11/07/14  Yes TCrecencio Mc MD  clopidogrel (PLAVIX) 75 MG tablet Take 75 mg by mouth daily.   Yes Historical Provider, MD  cyanocobalamin (,VITAMIN B-12,) 1000 MCG/ML injection 1000 mcg IM injection weekly 12/12/14  Yes TCrecencio Mc MD  gabapentin (NEURONTIN) 100 MG capsule Take 200 mg by mouth 2 (two) times daily.    Yes Historical Provider, MD    HYDROcodone-acetaminophen (NORCO) 10-325 MG per tablet Take 1 tablet by mouth every 6 (six) hours as needed. Patient taking differently: Take 1 tablet by mouth every 6 (six) hours as needed for moderate pain.  03/14/15  Yes CPierce CraneBeers, PA-C  nortriptyline (PAMELOR) 25 MG capsule TAKE 1 CAPSULE BY MOUTH EACH NIGHT AT BEDTIME. Patient taking differently: Take 25 mg by mouth at bedtime.  01/09/15  Yes TCrecencio Mc MD  sertraline (ZOLOFT) 100 MG tablet Take 1 tablet (100 mg total) by mouth daily. 04/08/14  Yes TCrecencio Mc MD  Syringe, Disposable, 3 ML MISC For use with B12 injections weekly/monthly 09/08/14  Yes TCrecencio Mc MD  traZODone (DESYREL) 50 MG tablet TAKE 1/2 TO 1 TABLET BY [place]OUTH [place] taking differently: Take 566mby mouth at night 01/26/15  Yes TeCrecencio McMD  buPROPion (WELLBUTRIN XL) 150 MG 24 hr tablet Take 1 tablet (150 mg total) by mouth daily. Patient not taking: Reported on 03/23/2015 12/12/14   TeCrecencio McMD  clobetasol ointment (TEMOVATE) 0.05 % Apply topically 2 (two) times daily. To labia For two to three weeks,  Then reduce use to twice per week Patient not taking: Reported on 03/23/2015 05/11/13   TeCrecencio McMD  ergocalciferol (DRISDOL) 50000 UNITS capsule Take 1 capsule (50,000 Units total) by mouth once a week. Patient not  taking: Reported on 03/23/2015 04/08/14   Crecencio Mc, MD  fluticasone [facility]) 50 MCG/ACT nasal spray 2 sprays in each nostril once daily Patient not taking: Reported on 03/23/2015 01/26/14   Crecencio Mc, MD  ibuprofen (ADVIL,MOTRIN) 800 MG tablet Take 1 tablet (800 mg total) by mouth every 8 (eight) hours as needed. Patient not taking: Reported on 03/23/2015 03/14/15   Arlyss Repress, PA-C  indomethacin (INDOCIN SR) 75 MG CR capsule Take 1 capsule (75 mg total) by mouth 2 (two) times daily with a meal. Patient not taking: Reported on 03/23/2015 10/08/14   Lyndal Pulley, DO     Family History   Problem Relation Age of Onset  . Diabetes Mother     type 2  . Heart disease Mother   . Hypertension Mother   . Coronary artery disease Father     History   Social History  . Marital Status: Married    Spouse Name: N/A  . Number of Children: N/A  . Years of Education: N/A   Social History Main Topics  . Smoking status: Former Smoker    Quit date: 12/13/2014  . Smokeless tobacco: Not on file     Comment: half to one pack of cigs per day  . Alcohol Use: No  . Drug Use: Not on file  . Sexual Activity: Not on file   Other Topics Concern  . None   Social History Narrative   Lives with spouse.     Review of Systems: A 12 point ROS discussed and pertinent positives are indicated in the HPI above.  All other systems are negative.  Review of Systems  Constitutional: Negative for activity change and appetite change.  HENT: Negative for tinnitus.   Eyes: Negative for photophobia and visual disturbance.  Respiratory: Negative for cough and shortness of breath.   Cardiovascular: Negative for chest pain.  Neurological: Positive for dizziness, weakness, light-headedness and headaches. Negative for tremors, seizures, syncope, facial asymmetry, speech difficulty and numbness.  Psychiatric/Behavioral: Negative for behavioral problems and confusion.    Vital Signs: BP 133/73 mmHg  Pulse 89  Temp(Src) 97.8 F (36.6 C) (Oral)  Resp 18  Ht 5' 1"  (1.549 m)  Wt 49.896 kg (110 lb)  BMI 20.80 kg/m2  SpO2 99%  Physical Exam  Constitutional: She is oriented to person, place, and time. She appears well-nourished.  Cardiovascular: Normal rate, regular rhythm and normal heart sounds.   No murmur heard. Pulmonary/Chest: Effort normal and breath sounds normal. She has no wheezes.  Abdominal: Soft. Bowel sounds are normal. There is no tenderness.  Musculoskeletal: Normal range of motion.  Neurological: She is alert and oriented to person, place, and time.  Skin: Skin is warm and dry.   Psychiatric: She has a normal mood and affect. Her behavior is normal. Judgment and thought content normal.  Nursing note and vitals reviewed.   Mallampati Score:  MD Evaluation Airway: WNL Heart: WNL Abdomen: WNL Chest/ Lungs: WNL ASA  Classification: 3 Mallampati/Airway Score: Two  Imaging: Dg Hand Complete Right  03/14/2015   CLINICAL DATA:  Right hand pain, right hand injury yesterday, third and fifth metacarpal pain  EXAM: RIGHT HAND - COMPLETE 3+ VIEW  COMPARISON:  None.  FINDINGS: Three views of the right hand submitted. No acute fracture or subluxation. Mild soft tissue swelling noted dorsal metacarpal region. Mild degenerative changes distal interphalangeal joints. Mild narrowing of radiocarpal joint space.  IMPRESSION: No acute fracture or subluxation.  Mild degenerative changes.  Electronically Signed   By: Lahoma Crocker M.D.   On: 03/14/2015 13:24   Ir Radiologist Eval & Mgmt  03/22/2015   EXAM: NEW PATIENT OFFICE VISIT  CHIEF COMPLAINT: Vertebrobasilar insufficiency.  Headaches.  Current Pain Level: 1-10  HISTORY OF PRESENT ILLNESS: The patient is a 69 year old right handed lady who has been referred for evaluation of new onset symptoms of dizziness and gait instability associated with transient blindness lasting up to an hour. The patient reports these may be proceeded by a occipital headache which then radiates bifrontally. She describes this headache as a sharp constant ache, with her head feeling as though it was going to burst. This is relieved by taking hydrocodone. There is associated nausea and vomiting. The loss of vision is from the periphery inwards. After the episode the patient feels significantly tired and spent out.  She denies having passed out or having had seizures associated with these.  She denies any associated palpitations, chest pains or shortness of breath or difficulty breathing during the spells.  The patient reports she may at times feel confused following  these spells.  A subsequent workup in the form of a CT scan of brain, MRI of the brain and MRA of the brain revealed significant decreased caliber of the right vertebrobasilar junction. The more proximal portions of the vertebrobasilar system were not included on the MRA study.  The patient reports these symptoms having started about a year ago and so far has had about 3 or 4 the most recent being a few weeks ago.  Past Medical History: Splenectomy 1990. Tubal ligation 1980. Hysterectomy 1972. Cholecystectomy 1988. Resection of small bowel for Crohn's disease 2008. The patient has had no flare up of Crohn's for a year now. She also underwent a catheter angiogram years ago her for heart difficulties reportedly unremarkable.  Medications: Nortriptyline. Trazodone. Clonazepam. Aspirin 81 mg a day. Plavix 75 mg a day. Gabapentin.  Allergies: Allergies to sulfa drugs which cause her to break out and swell.  Social History: Patient is married. Has three healthy boys. One brother healthy. One sister died of MI. The patient stopped smoking 4 months ago. Otherwise, prior to that smoked a pack a day for 50 years. Denies use of illicit chemicals.  Family History: Sister with cancer. Mother with headaches. High blood pressure. Sister also had migraines. Mother died of MI. Father died of MI also.  REVIEW OF SYSTEMS: Here from above the essentially negative for pathologic symptomatology.  PHYSICAL EXAMINATION: Affect normal.  In no acute distress.  Alert, awake and oriented to time, place, space. Speech and comprehension within normal limits. No lateralizing cranial nerve, motor or sensory findings. Coordination grossly unremarkable. Station and gait within normal limits.  ASSESSMENT AND PLAN: The patient's recent MRI of the brain and MRA of the brain were reviewed with her.  It was felt given the above symptoms and the risk factors of smoking and high blood pressure, the patient would probably need a formal catheter angiogram to  accurately evaluate the suspected findings on the MRA examination in regards to right vertebrobasilar junction significant narrowing. Also this would provide an opportunity to evaluate the extracranial vertebrobasilar system.  The procedure, the risks, benefits, and alternatives were reviewed with the patient.  Questions were answered.  The patient is willing to proceed with a diagnostic catheter angiogram. This will be scheduled at the earliest possible. The patient has been asked to call should she have any concerns or questions.   Electronically Signed  By: Luanne Bras M.D.   On: 03/21/2015 08:52    Labs:  CBC:  Recent Labs  10/07/14 0603 10/12/14 1004 03/24/15 1027  WBC 14.6* 10.6 8.9  HGB 13.6 14.0 13.2  HCT 41.5 42.4 40.3  PLT 274 270 316    COAGS:  Recent Labs  03/24/15 1027  INR 0.94  APTT 29    BMP:  Recent Labs  10/07/14 0603 10/12/14 1004 12/12/14 0945 03/24/15 1027  NA 133* 136 138 140  K 3.8 4.1 3.8 4.1  CL 102 104 105 107  CO2 25 24 25 25   GLUCOSE 133* 110* 95 98  BUN 12 19* 10 14  CALCIUM 8.5 8.4* 9.9 9.2  CREATININE 0.76 0.80 0.74 0.77  GFRNONAA  --   --   --  >60  GFRAA  --   --   --  >60    LIVER FUNCTION TESTS:  Recent Labs  04/08/14 1026 10/12/14 1004 12/12/14 0945  BILITOT 0.6  --  0.5  AST 22 29 26   ALT 24 37 28  ALKPHOS 43 55 49  PROT 6.2 6.1* 7.0  ALBUMIN 4.0 3.7 4.6    TUMOR MARKERS: No results for input(s): AFPTM, CEA, CA199, CHROMGRNA in the last 8760 hours.  Assessment and Plan:  Dizziness; gait instability; headache; blindness occurred 2 mo ago Associated N/Vlasting 1 hr--all has resolved MRI/MRA reveals R Vertebrobasilar artery stenosis Now scheduled for cerebral arteriogram Risks and Benefits discussed with the patient including, but not limited to bleeding, infection, vascular injury, contrast induced renal failure, stroke or even death. All of the patient's questions were answered, patient is agreeable to  proceed. Consent signed and in chart.  Thank you for this interesting consult.  I greatly enjoyed meeting Tamara [name] and look forward to participating in their care.  Signed: Leanette Eutsler A 03/24/2015, 11:19 AM   I spent a total of  20 Minutes   in face to face in clinical consultation, greater than 50% of which was counseling/coordinating care for cerebral arteriogram

## 2015-03-24 NOTE — Sedation Documentation (Signed)
C/O headache

## 2015-03-24 NOTE — Sedation Documentation (Signed)
Transferred via stretcher to Short stay 12 report to Denice Paradise RN, Dressing to R groin c/d/i no hematoma

## 2015-03-24 NOTE — Progress Notes (Addendum)
1650 groin check/ pt bleeding minimal amount, pressure held, Dr Estanislado Pandy was paged., told to hold pressure 15 minutes, keep pt flat, V pad in place while pressure held. Sand bag was on site prior to check. Pressure held 40 minutes with V pad/ continues to ooze at 15 minutes/ 30 minutes/ 40 minutes.Vilinda Blanks, RN continues to hold pressure right groin, pt has palpable pt +1 pulse on right leg. Pt states her right leg still hurt, no hematoma at site, groin level 0, Dr Estanislado Pandy paged/ returned call with orders, Heather tech from IR now holding pressure.

## 2015-03-25 DIAGNOSIS — G450 Vertebro-basilar artery syndrome: Secondary | ICD-10-CM | POA: Diagnosis not present

## 2015-03-25 MED ORDER — HYDROCODONE-ACETAMINOPHEN 10-325 MG PO TABS
1.0000 | ORAL_TABLET | Freq: Four times a day (QID) | ORAL | Status: DC | PRN
  Administered 2015-03-25: 1 via ORAL
  Filled 2015-03-25: qty 1

## 2015-03-25 MED ORDER — SERTRALINE HCL 100 MG PO TABS
100.0000 mg | ORAL_TABLET | Freq: Every day | ORAL | Status: DC
  Filled 2015-03-25: qty 1

## 2015-03-25 MED ORDER — GABAPENTIN 100 MG PO CAPS
200.0000 mg | ORAL_CAPSULE | Freq: Two times a day (BID) | ORAL | Status: DC
  Filled 2015-03-25: qty 2

## 2015-03-25 MED ORDER — SODIUM CHLORIDE 0.9 % IV SOLN: INTRAVENOUS | Status: DC

## 2015-03-25 MED ORDER — NORTRIPTYLINE HCL 25 MG PO CAPS
25.0000 mg | ORAL_CAPSULE | Freq: Every day | ORAL | Status: DC
  Filled 2015-03-25: qty 1

## 2015-03-25 NOTE — Discharge Summary (Signed)
Physician Discharge Summary  Patient ID: Tamara [name] MRN: [id number] DOB/AGE: 07/15/1946 69 y.o.  Admit date: 03/24/2015 Discharge date: 03/25/2015  Admission Diagnoses: Active Problems:   Hematoma  Discharge Diagnoses:  Active Problems:   Hematoma    Procedures: Diagnostic cerebral arteriogram  Discharged Condition: good  Hospital Course: Pt admitted for observation after cerebral arteriogram. Right groin hematoma developed post procedure requiring prolonged compression and bed rest with straight leg. Pt doing well this morning, has been ambulating with signs of recurrent bleed. Determined stable for discharge Medications reviewed, patient to discontinue use of Plavix.   Consults: None   Discharge Exam: Blood pressure 126/70, pulse 79, temperature 98.1 F (36.7 C), temperature source Oral, resp. rate 14, height 5' 1"  (1.549 m), weight 113 lb 8.6 oz (51.5 kg), SpO2 96 %. General: NAD Lungs: CTA without w/r/r Heart: Regular Ext: (R)groin soft, NT, some ecchymosis but no hematoma Feet warm bilat, pulses intact   Disposition: 01-Home or Self Care  Discharge Instructions    Call MD for:  redness, tenderness, or signs of infection (pain, swelling, redness, odor or green/yellow discharge around incision site)    Complete by:  As directed      Call MD for:  severe uncontrolled pain    Complete by:  As directed      Diet - low sodium heart healthy    Complete by:  As directed      Driving Restrictions    Complete by:  As directed   Avoid driving for a few days unless an emergency     Increase activity slowly    Complete by:  As directed      May shower / Bathe    Complete by:  As directed      May walk up steps    Complete by:  As directed      No dressing needed    Complete by:  As directed             Medication List    STOP taking these medications        buPROPion 150 MG 24 hr tablet  Commonly known as:  WELLBUTRIN XL     clobetasol ointment  0.05 %  Commonly known as:  TEMOVATE     clopidogrel 75 MG tablet  Commonly known as:  PLAVIX     ergocalciferol 50000 UNITS capsule  Commonly known as:  DRISDOL     fluticasone 50 MCG/ACT nasal spray  Commonly known as:  FLONASE     ibuprofen 800 MG tablet  Commonly known as:  ADVIL,MOTRIN     indomethacin 75 MG CR capsule  Commonly known as:  INDOCIN SR      TAKE these medications        clonazePAM 0.5 MG tablet  Commonly known as:  KLONOPIN  TAKE 1 TABLET BY MOUTH ONCE DAILY AS NEEDED FOR ANXIETY.     cyanocobalamin 1000 MCG/ML injection  Commonly known as:  (VITAMIN B-12)  1000 mcg IM injection weekly     gabapentin 100 MG capsule  Commonly known as:  NEURONTIN  Take 200 mg by mouth 2 (two) times daily.     HYDROcodone-acetaminophen 10-325 MG per tablet  Commonly known as:  NORCO  Take 1 tablet by mouth every 6 (six) hours as needed.     nortriptyline 25 MG capsule  Commonly known as:  PAMELOR  TAKE 1 CAPSULE BY MOUTH EACH NIGHT AT BEDTIME.     sertraline 100  MG tablet  Commonly known as:  ZOLOFT  Take 1 tablet (100 mg total) by mouth daily.     Syringe (Disposable) 3 ML Misc  For use with B12 injections weekly/monthly     traZODone 50 MG tablet  Commonly known as:  DESYREL  TAKE 1/2 TO 1 TABLET BY MOUTH AT [facility] NEEDED FOR SLEEP         Signed: Ascencion Dike PA-C 03/25/2015, 8:17 AM

## 2015-03-27 ENCOUNTER — Encounter: Payer: Self-pay | Admitting: Internal Medicine

## 2015-03-29 ENCOUNTER — Encounter: Payer: Self-pay | Admitting: Internal Medicine

## 2015-04-03 ENCOUNTER — Encounter: Payer: Self-pay | Admitting: Internal Medicine

## 2015-04-04 MED ORDER — ACYCLOVIR 400 MG PO TABS: 400.0000 mg | ORAL_TABLET | Freq: Three times a day (TID) | ORAL | Status: DC

## 2015-04-04 NOTE — Telephone Encounter (Signed)
Pt called, left message requesting  Valacyclovir HCL 1GM.(B-Valtrex). I have a few from last time. I have 5 more from where I took them last time. It 3 times a day.She states she has shingles. I know what they are I have had them before. Please advise

## 2015-04-04 NOTE — Telephone Encounter (Signed)
done

## 2015-04-07 ENCOUNTER — Other Ambulatory Visit: Payer: Self-pay | Admitting: Internal Medicine

## 2015-04-07 NOTE — Telephone Encounter (Signed)
Refill authorized and printed

## 2015-04-07 NOTE — Telephone Encounter (Signed)
Ok to fill 

## 2015-04-25 ENCOUNTER — Other Ambulatory Visit: Payer: Self-pay | Admitting: *Deleted

## 2015-04-25 ENCOUNTER — Other Ambulatory Visit: Payer: Self-pay | Admitting: Internal Medicine

## 2015-04-25 MED ORDER — SYRINGE (DISPOSABLE) 3 ML MISC: Status: DC

## 2015-04-25 MED ORDER — TRAZODONE HCL 50 MG PO TABS: ORAL_TABLET | ORAL | Status: DC

## 2015-06-27 ENCOUNTER — Emergency Department: Payer: Medicare Other

## 2015-06-27 ENCOUNTER — Encounter: Payer: Self-pay | Admitting: Emergency Medicine

## 2015-06-27 ENCOUNTER — Emergency Department
Admission: EM | Admit: 2015-06-27 | Discharge: 2015-06-27 | Disposition: A | Discharge status: Home / Self Care | Payer: Medicare Other | Attending: Emergency Medicine | Admitting: Emergency Medicine

## 2015-06-27 DIAGNOSIS — Z79899 Other long term (current) drug therapy: Secondary | ICD-10-CM | POA: Insufficient documentation

## 2015-06-27 DIAGNOSIS — Y998 Other external cause status: Secondary | ICD-10-CM | POA: Insufficient documentation

## 2015-06-27 DIAGNOSIS — Y9289 Other specified places as the place of occurrence of the external cause: Secondary | ICD-10-CM | POA: Diagnosis not present

## 2015-06-27 DIAGNOSIS — Z87891 Personal history of nicotine dependence: Secondary | ICD-10-CM | POA: Diagnosis not present

## 2015-06-27 DIAGNOSIS — S63502A Unspecified sprain of left wrist, initial encounter: Secondary | ICD-10-CM | POA: Insufficient documentation

## 2015-06-27 DIAGNOSIS — S199XXA Unspecified injury of neck, initial encounter: Secondary | ICD-10-CM | POA: Insufficient documentation

## 2015-06-27 DIAGNOSIS — W07XXXA Fall from chair, initial encounter: Secondary | ICD-10-CM | POA: Diagnosis not present

## 2015-06-27 DIAGNOSIS — S0990XA Unspecified injury of head, initial encounter: Secondary | ICD-10-CM

## 2015-06-27 DIAGNOSIS — W2209XA Striking against other stationary object, initial encounter: Secondary | ICD-10-CM | POA: Diagnosis not present

## 2015-06-27 DIAGNOSIS — S0181XA Laceration without foreign body of other part of head, initial encounter: Secondary | ICD-10-CM | POA: Diagnosis not present

## 2015-06-27 DIAGNOSIS — Y9389 Activity, other specified: Secondary | ICD-10-CM | POA: Diagnosis not present

## 2015-06-27 MED ORDER — OXYCODONE HCL 5 MG PO TABS
ORAL_TABLET | ORAL | Status: AC
  Administered 2015-06-27: 5 mg via ORAL
  Filled 2015-06-27: qty 1

## 2015-06-27 MED ORDER — LIDOCAINE-EPINEPHRINE (PF) 1 %-1:200000 IJ SOLN
30.0000 mL | Freq: Once | INTRAMUSCULAR | Status: AC
  Administered 2015-06-27: 30 mL
  Filled 2015-06-27: qty 30

## 2015-06-27 MED ORDER — OXYCODONE HCL 5 MG PO TABS
5.0000 mg | ORAL_TABLET | Freq: Once | ORAL | Status: AC
  Administered 2015-06-27: 5 mg via ORAL

## 2015-06-27 MED ORDER — KETOROLAC TROMETHAMINE 30 MG/ML IJ SOLN
30.0000 mg | Freq: Once | INTRAMUSCULAR | Status: AC
  Administered 2015-06-27: 30 mg via INTRAVENOUS
  Filled 2015-06-27: qty 1

## 2015-06-27 MED ORDER — OXYCODONE-ACETAMINOPHEN 5-325 MG PO TABS: 1.0000 | ORAL_TABLET | Freq: Four times a day (QID) | ORAL | Status: DC | PRN

## 2015-06-27 MED ORDER — LIDOCAINE-EPINEPHRINE (PF) 1 %-1:200000 IJ SOLN
INTRAMUSCULAR | Status: AC
  Administered 2015-06-27: 30 mL
  Filled 2015-06-27: qty 30

## 2015-06-27 NOTE — ED Notes (Addendum)
Pt presents to ED via ED EMS for fall. Pt states she was standing on stool, trying to hand a picture and slipped off. Pt states she hit head on a wall. 7cm/2cm laceration noted on forehead. Bleeding controlled. Pt reports slight head. Pt also reports  Neck pain and left forearm pain. No deformity noted. C-collar placed. Pt alerts and oriented x4 at this time. Pt denies taking blood thinner, states does not up to date on Tdap.
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
## 2015-06-28 ENCOUNTER — Encounter: Payer: Self-pay | Admitting: Internal Medicine

## 2015-06-28 NOTE — Telephone Encounter (Signed)
Scheduled patient for Monday OK to remove stitches from head placed at ER.

## 2015-07-03 ENCOUNTER — Encounter: Payer: Self-pay | Admitting: Internal Medicine

## 2015-07-03 ENCOUNTER — Ambulatory Visit (INDEPENDENT_AMBULATORY_CARE_PROVIDER_SITE_OTHER): Payer: Medicare Other | Admitting: Internal Medicine

## 2015-07-03 VITALS — BP 124/74 | HR 76 | Temp 98.2°F | Resp 14 | Ht 61.0 in | Wt 114.2 lb

## 2015-07-03 DIAGNOSIS — G44309 Post-traumatic headache, unspecified, not intractable: Secondary | ICD-10-CM

## 2015-07-03 DIAGNOSIS — Z23 Encounter for immunization: Secondary | ICD-10-CM | POA: Diagnosis not present

## 2015-07-03 DIAGNOSIS — S63502A Unspecified sprain of left wrist, initial encounter: Secondary | ICD-10-CM

## 2015-07-03 DIAGNOSIS — S0101XA Laceration without foreign body of scalp, initial encounter: Secondary | ICD-10-CM

## 2015-07-03 DIAGNOSIS — S66912A Strain of unspecified muscle, fascia and tendon at wrist and hand level, left hand, initial encounter: Secondary | ICD-10-CM

## 2015-07-03 MED ORDER — MELOXICAM 15 MG PO TABS: 15.0000 mg | ORAL_TABLET | Freq: Every day | ORAL | Status: DC

## 2015-07-03 NOTE — Patient Instructions (Signed)
Do not wash hair or forehead until Steri strips fall off   Use ice packs to forehead for 15 minutes every few hours  Adding meloxicam for muscle contusions/pain  Continue using wrist splint until PT sees you

## 2015-07-03 NOTE — Progress Notes (Signed)
Pre-visit discussion using our clinic review tool. No additional management support is needed unless otherwise documented below in the visit note.  

## 2015-07-03 NOTE — Progress Notes (Signed)
Subjective:  Patient ID: Tamara [name], female    DOB: 03/22/1946  Age: 69 y.o. MRN: [id number]  CC: The primary encounter diagnosis was Left wrist sprain, initial encounter. Diagnoses of Need for prophylactic vaccination with combined diphtheria-tetanus-pertussis (DTP) vaccine, Laceration of scalp without complication, initial encounter, Post-traumatic headache, not intractable, unspecified chronicity pattern, and Strain of wrist, left, initial encounter were also pertinent to this visit.  HPI Tamara [name] presents for removal of stitches from forehead laceration.  Stitches were placed last week by ER physician after patient presented with a superficial scalp laceration sustained during her recent fall off a a step ladder while trying to hang a heavy picture frame.  She reports that she struck the floor face first and landed On top of the wooden frame  No Td vaccine was given .  Head CT and cervical spine films were negative for fractures,  SDH, etc.     Has had a persistent headache and left wrist pain since the fall,   Headache in the occipital l region, laceration in the forehead.  Taking oxycodone 5/325  But too strong, has changed to vicodin .  Not using any NSAIDs.  No vision changes aor altered consiciousness.  Forehead tender to palpation.  X ray sof wrist were negative for fracutre,  But was placed in a wrist splint and has been wearing it since last Tuesday 24/7   Needs PT for wrist sprain left wrist        Outpatient Prescriptions Prior to Visit  Medication Sig Dispense Refill  . acyclovir (ZOVIRAX) 400 MG tablet Take 1 tablet (400 mg total) by mouth 3 (three) times daily. 21 tablet 0  . clonazePAM (KLONOPIN) 0.5 MG tablet TAKE 1 TABLET BY MOUTH ONCE DAILY AS NEEDED FOR ANXIETY. 30 tablet 3  . cyanocobalamin (,VITAMIN B-12,) 1000 MCG/ML injection 1000 mcg IM injection weekly 10 mL 3  . gabapentin (NEURONTIN) 100 MG capsule Take 200 mg by mouth 2 (two) times daily.     Marland Kitchen  HYDROcodone-acetaminophen (NORCO) 10-325 MG per tablet Take 1 tablet by mouth every 6 (six) hours as needed. 15 tablet 0  . nortriptyline (PAMELOR) 25 MG capsule TAKE 1 CAPSULE BY MOUTH EACH NIGHT AT BEDTIME. (Patient taking differently: Take 25 mg by mouth at bedtime. ) 90 capsule 3  . oxyCODONE-acetaminophen (ROXICET) 5-325 MG per tablet Take 1 tablet by mouth every 6 (six) hours as needed. 9 tablet 0  . sertraline (ZOLOFT) 100 MG tablet Take 1 tablet (100 mg total) by mouth daily. 30 tablet 6  . Syringe, Disposable, 3 ML MISC For use with B12 injections weekly/monthly 25 each 0  . traZODone (DESYREL) 50 MG tablet TAKE 1/2 TO 1 TABLET BY MOUTH AT BEDTIMEAS NEEDED FOR SLEEP 90 tablet 0   No facility-administered medications prior to visit.    Review of Systems;  Patient denies headache, fevers, malaise, unintentional weight loss, skin rash, eye pain, sinus congestion and sinus pain, sore throat, dysphagia,  hemoptysis , cough, dyspnea, wheezing, chest pain, palpitations, orthopnea, edema, abdominal pain, nausea, melena, diarrhea, constipation, flank pain, dysuria, hematuria, urinary  Frequency, nocturia, numbness, tingling, seizures,  Focal weakness, Loss of consciousness,  Tremor, insomnia, depression, anxiety, and suicidal ideation.      Objective:  BP 124/74 mmHg  Pulse 76  Temp(Src) 98.2 F (36.8 C) (Oral)  Resp 14  Ht 5' 1"  (1.549 m)  Wt 114 lb 4 oz (51.823 kg)  BMI 21.60 kg/m2  SpO2 96%  BP  Readings from Last 3 Encounters:  07/03/15 124/74  06/27/15 117/72  03/25/15 124/62    Wt Readings from Last 3 Encounters:  07/03/15 114 lb 4 oz (51.823 kg)  06/27/15 113 lb (51.256 kg)  03/25/15 113 lb 8.6 oz (51.5 kg)    General appearance: alert, cooperative and appears stated age Head: L shaped laceration on forehead with sutures  Intact,  Some bruising noted around the stitching without erythema or drainage.  Ears: normal TM's and external ear canals both ears Neck: no  cervical spine tenderness Back: symmetric, no curvature. ROM normal. No CVA tenderness. Lungs: clear to auscultation bilaterally Heart: regular rate and rhythm, S1, S2 normal, no murmur, click, rub or gallop Abdomen: soft, non-tender; bowel sounds normal; no masses,  no organomegaly Neuro: CNs 2-12 intact. DTRs 2+/4 in biceps, brachioradialis, patellars and achilles. Muscle strength 5/5 in upper and lower exremities. Fine resting tremor bilaterally both hands cerebellar function normal. Romberg negative.  No pronator drift.   Gait normal.   No results found for: HGBA1C  Lab Results  Component Value Date   CREATININE 0.77 03/24/2015   CREATININE 0.74 12/12/2014   CREATININE 0.80 10/12/2014    Lab Results  Component Value Date   WBC 8.9 03/24/2015   HGB 13.2 03/24/2015   HCT 40.3 03/24/2015   PLT 316 03/24/2015   GLUCOSE 98 03/24/2015   CHOL 209* 12/12/2014   TRIG 118.0 12/12/2014   HDL 75.40 12/12/2014   LDLDIRECT 121.1 01/26/2014   LDLCALC 110* 12/12/2014   ALT 28 12/12/2014   AST 26 12/12/2014   NA 140 03/24/2015   K 4.1 03/24/2015   CL 107 03/24/2015   CREATININE 0.77 03/24/2015   BUN 14 03/24/2015   CO2 25 03/24/2015   TSH 2.470 12/12/2014   INR 0.96 03/24/2015    Dg Wrist Complete Left  06/27/2015   CLINICAL DATA:  Patient fell off of a stool while hanging a picture at home earlier today, injuring the left wrist. Initial encounter.  EXAM: LEFT WRIST - COMPLETE 3+ VIEW  COMPARISON:  None.  FINDINGS: No evidence of acute fracture or dislocation. Joint spaces well preserved for age. Mild osseous demineralization. Tiny accessory ossicle or calcification in the triangular fibrocartilage complex adjacent to the ulnar styloid.  IMPRESSION: No acute osseous abnormality.   Electronically Signed   By: Evangeline Dakin M.D.   On: 06/27/2015 17:41   Ct Head Wo Contrast  06/27/2015   CLINICAL DATA:  Fall. Head injury. Laceration noted on forehead. Neck pain. Prior cervical fusion.   EXAM: CT HEAD WITHOUT CONTRAST  CT CERVICAL SPINE WITHOUT CONTRAST  TECHNIQUE: Multidetector CT imaging of the head and cervical spine was performed following the standard protocol without intravenous contrast. Multiplanar CT image reconstructions of the cervical spine were also generated.  COMPARISON:  Brain MR of 12/24/2014. Cervical spine plain films of 05/25/2010.  FINDINGS: CT HEAD FINDINGS  Sinuses/Soft tissues: Soft tissue injury about the left frontal scalp. No underlying skull fracture. Clear paranasal sinuses and mastoid air cells.  Intracranial: No mass lesion, hemorrhage, hydrocephalus, acute infarct, intra-axial, or extra-axial fluid collection.  CT CERVICAL SPINE FINDINGS  Spinal visualization through the bottom of T2. Prevertebral soft tissues are within normal limits. Status post anterior fixation at C5-7 without acute hardware complication. Centrilobular emphysema, without apical pneumothorax. Skull base intact. Maintenance of vertebral body height and alignment. Facets are well-aligned. Coronal reformats demonstrate a normal C1-C2 articulation.  IMPRESSION: 1. Left frontal scalp soft tissue injury, without acute intracranial  abnormality. 2. No acute findings in the cervical spine. Anterior fixation at C5-7, without acute complication   Electronically Signed   By: Abigail Miyamoto M.D.   On: 06/27/2015 18:34   Ct Cervical Spine Wo Contrast  06/27/2015   CLINICAL DATA:  Fall. Head injury. Laceration noted on forehead. Neck pain. Prior cervical fusion.  EXAM: CT HEAD WITHOUT CONTRAST  CT CERVICAL SPINE WITHOUT CONTRAST  TECHNIQUE: Multidetector CT imaging of the head and cervical spine was performed following the standard protocol without intravenous contrast. Multiplanar CT image reconstructions of the cervical spine were also generated.  COMPARISON:  Brain MR of 12/24/2014. Cervical spine plain films of 05/25/2010.  FINDINGS: CT HEAD FINDINGS  Sinuses/Soft tissues: Soft tissue injury about the left  frontal scalp. No underlying skull fracture. Clear paranasal sinuses and mastoid air cells.  Intracranial: No mass lesion, hemorrhage, hydrocephalus, acute infarct, intra-axial, or extra-axial fluid collection.  CT CERVICAL SPINE FINDINGS  Spinal visualization through the bottom of T2. Prevertebral soft tissues are within normal limits. Status post anterior fixation at C5-7 without acute hardware complication. Centrilobular emphysema, without apical pneumothorax. Skull base intact. Maintenance of vertebral body height and alignment. Facets are well-aligned. Coronal reformats demonstrate a normal C1-C2 articulation.  IMPRESSION: 1. Left frontal scalp soft tissue injury, without acute intracranial abnormality. 2. No acute findings in the cervical spine. Anterior fixation at C5-7, without acute complication   Electronically Signed   By: Abigail Miyamoto M.D.   On: 06/27/2015 18:34    Assessment & Plan:   Problem List Items Addressed This Visit      Unprioritized   Post-traumatic headache    Post traumatic,  Occipital .  Adding NSAID.  Head CT and cervical spine CT unremarkable       Relevant Medications   meloxicam (MOBIC) 15 MG tablet   Laceration of scalp without complication    Er records including CT of head reviewed with patinet today.   sutures were removed  From forehead today and wound was dressed with steri strips to prevent reopening.  Wound was covered with  non stick dressing Pateint was advised to avoid saturating head until steri strips fall off.       Strain of wrist, left    secondary to fall.  Reports of Plain films reviewed.  No fractures reported, . Adding NSAID,  Continue brace,  PT referral.        Other Visit Diagnoses    Left wrist sprain, initial encounter    -  Primary    Relevant Orders    Ambulatory referral to Physical Therapy    Need for prophylactic vaccination with combined diphtheria-tetanus-pertussis (DTP) vaccine        Relevant Orders    Tdap vaccine greater  than or equal to 7yo IM (Completed)       I am having Ms. Mckelvy start on meloxicam. I am also having her maintain her sertraline, cyanocobalamin, nortriptyline, gabapentin, HYDROcodone-acetaminophen, acyclovir, clonazePAM, Syringe (Disposable), traZODone, and oxyCODONE-acetaminophen.  Meds ordered this encounter  Medications  . meloxicam (MOBIC) 15 MG tablet    Sig: Take 1 tablet (15 mg total) by mouth daily.    Dispense:  30 tablet    Refill:  0    There are no discontinued medications.  Follow-up: No Follow-up on file.   Crecencio Mc, MD

## 2015-07-04 DIAGNOSIS — S66912A Strain of unspecified muscle, fascia and tendon at wrist and hand level, left hand, initial encounter: Secondary | ICD-10-CM | POA: Insufficient documentation

## 2015-07-04 NOTE — Assessment & Plan Note (Addendum)
secondary to fall.  Reports of Plain films reviewed.  No fractures reported, . Adding NSAID,  Continue brace,  PT referral.

## 2015-07-04 NOTE — Assessment & Plan Note (Addendum)
Post traumatic,  Occipital .  Adding NSAID.  Head CT and cervical spine CT unremarkable

## 2015-07-04 NOTE — Assessment & Plan Note (Signed)
Er records including CT of head reviewed with patinet today.   sutures were removed  From forehead today and wound was dressed with steri strips to prevent reopening.  Wound was covered with  non stick dressing Pateint was advised to avoid saturating head until steri strips fall off.

## 2015-07-09 ENCOUNTER — Encounter: Payer: Self-pay | Admitting: Internal Medicine

## 2015-07-10 NOTE — Telephone Encounter (Signed)
Please advise 

## 2015-07-26 ENCOUNTER — Other Ambulatory Visit: Payer: Self-pay | Admitting: Internal Medicine

## 2015-07-31 ENCOUNTER — Encounter: Payer: Self-pay | Admitting: Internal Medicine

## 2015-08-05 ENCOUNTER — Other Ambulatory Visit: Payer: Self-pay | Admitting: Internal Medicine

## 2015-08-07 IMAGING — CR DG CHEST 1V
1 series · 1 of 1 positions shown · non-contrast
Comparison: None.

CLINICAL DATA: Left anterior rib pain

EXAM:
CHEST - 1 VIEW; LEFT RIBS - 2 VIEW

[lat]
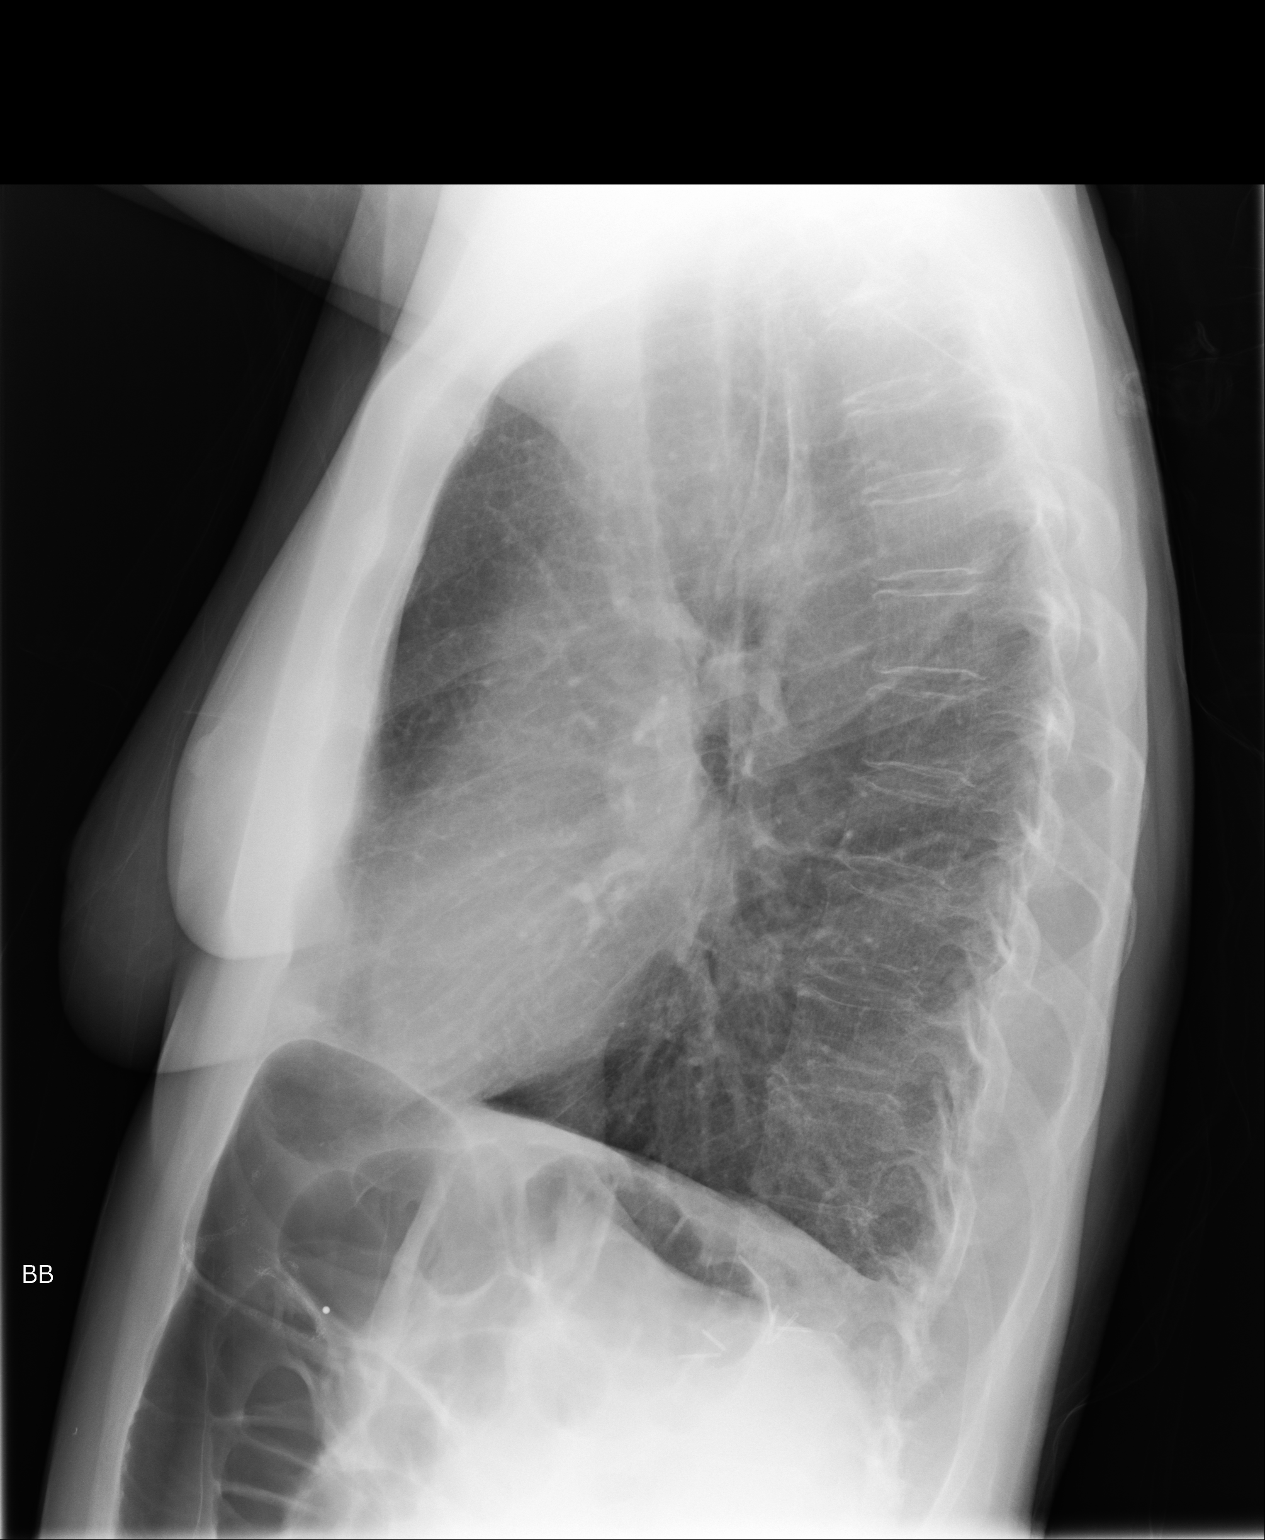

[1 of 1 positions shown; findings below may reference images not displayed]

FINDINGS: No fracture or other bone lesions are seen involving the ribs. There
is no evidence of pneumothorax or pleural effusion. Both lungs are
clear. Heart size and mediastinal contours are within normal limits.
IMPRESSION: Negative.

## 2015-08-07 IMAGING — CR DG RIBS 2V*L*
1 series · 5 of 5 positions shown · non-contrast
Comparison: None.

CLINICAL DATA: Left anterior rib pain

EXAM:
CHEST - 1 VIEW; LEFT RIBS - 2 VIEW

[Series 1: pa · 0.17mm/px · 5 of 5 slices shown]
[im 1/5]
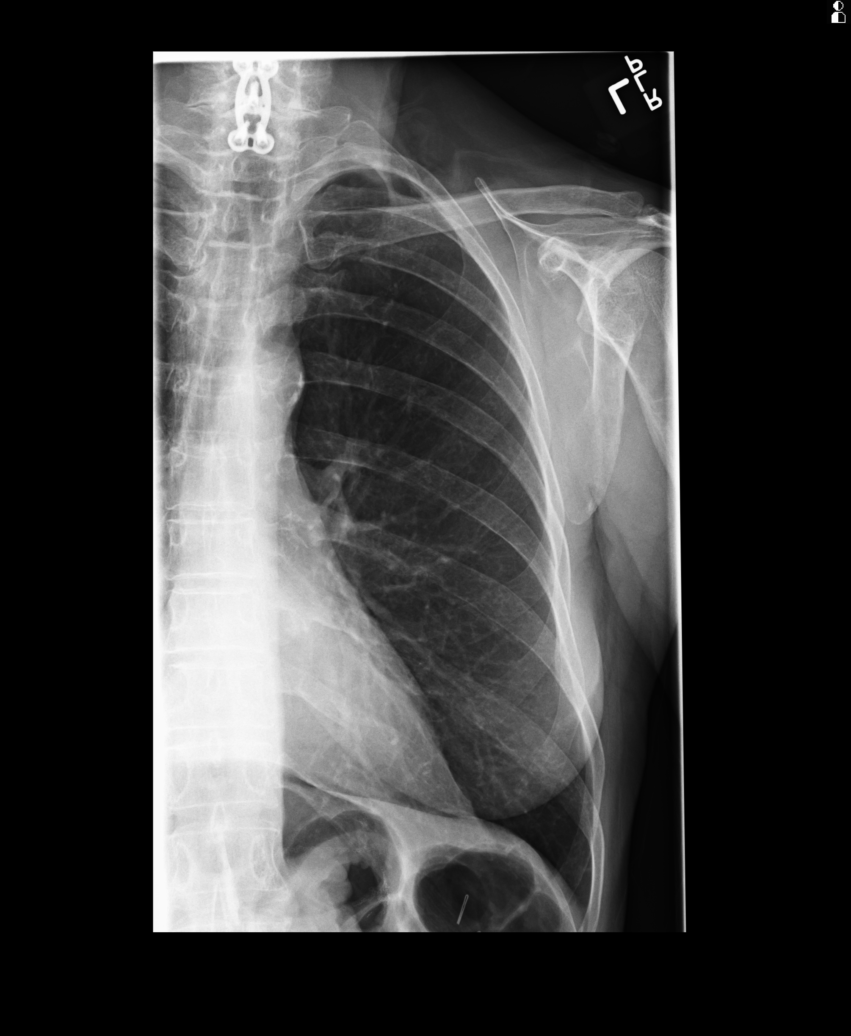
[im 2/5]
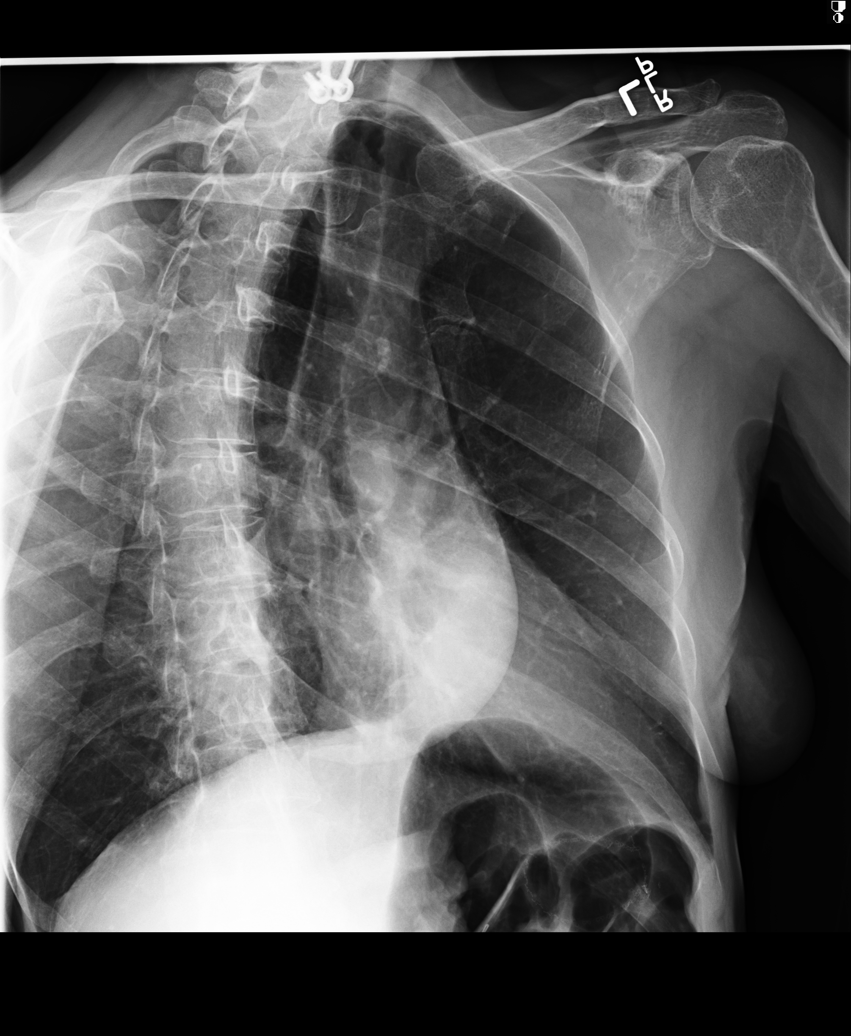
[im 3/5]
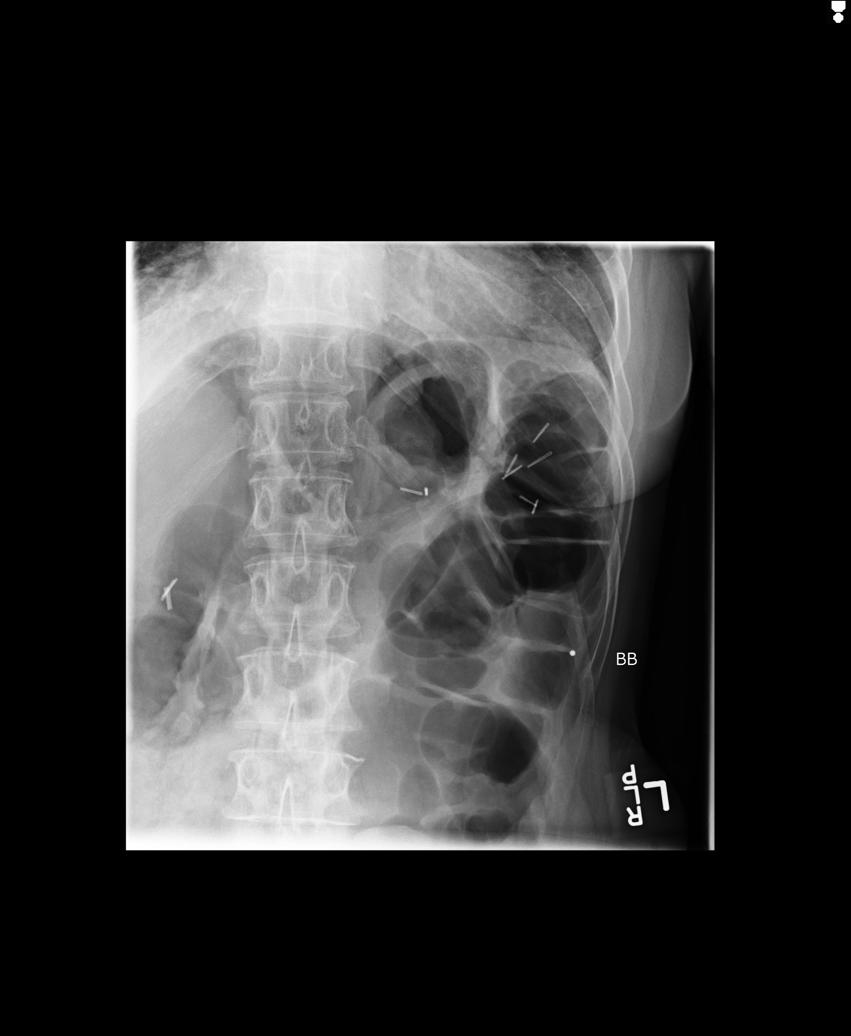
[im 4/5]
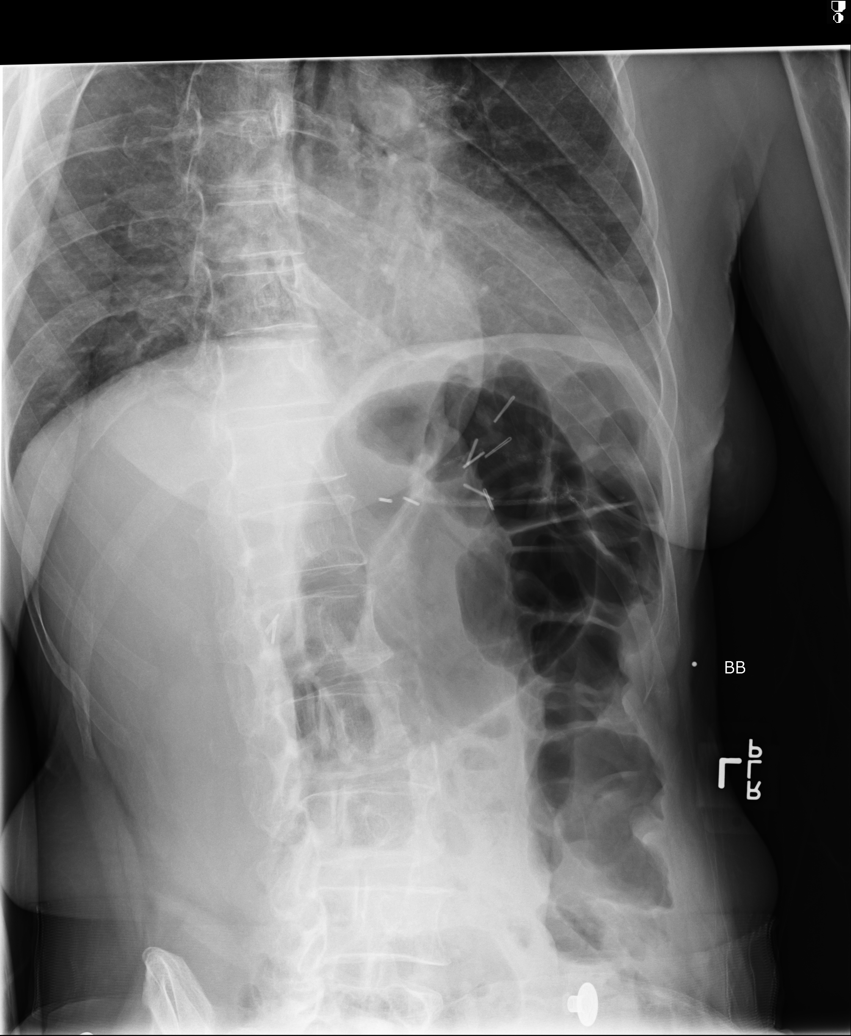
[im 5/5]
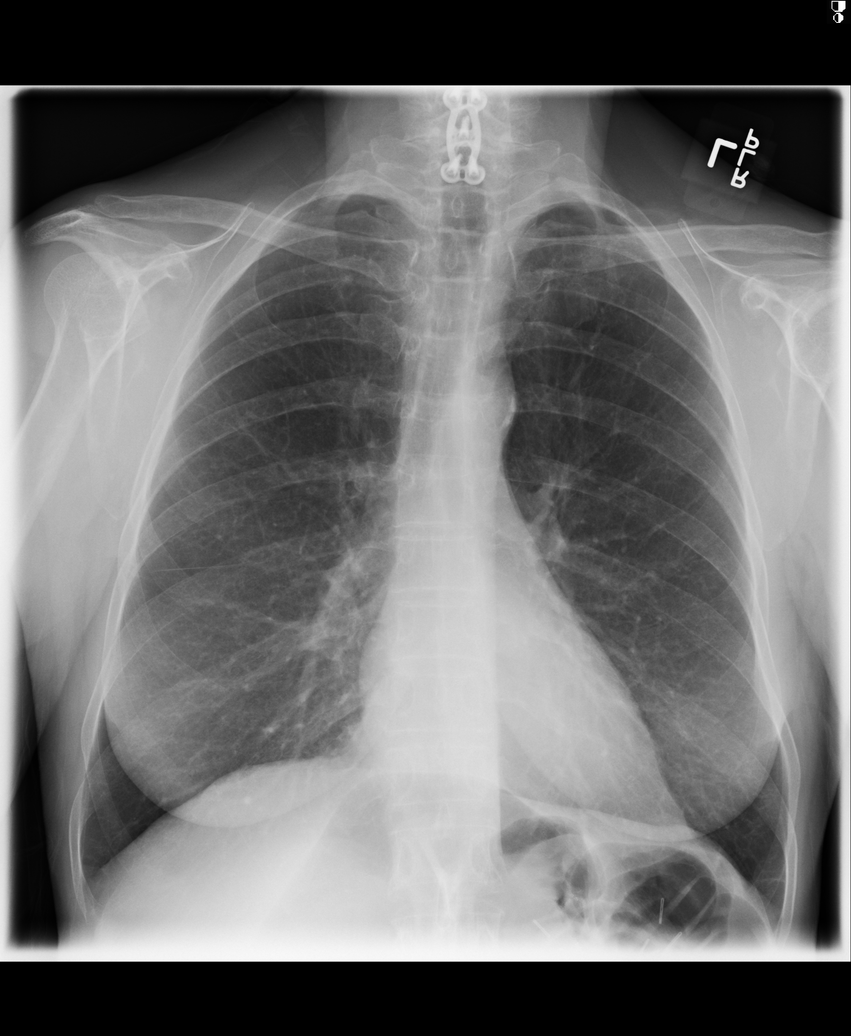

[5 of 5 positions shown; findings below may reference images not displayed]

FINDINGS: No fracture or other bone lesions are seen involving the ribs. There
is no evidence of pneumothorax or pleural effusion. Both lungs are
clear. Heart size and mediastinal contours are within normal limits.
IMPRESSION: Negative.

## 2015-08-08 NOTE — Telephone Encounter (Signed)
Please advise 

## 2015-08-09 NOTE — Telephone Encounter (Signed)
Ok to refill,  printed rx  

## 2015-08-24 ENCOUNTER — Encounter: Payer: Self-pay | Admitting: Internal Medicine

## 2015-08-24 ENCOUNTER — Ambulatory Visit (INDEPENDENT_AMBULATORY_CARE_PROVIDER_SITE_OTHER): Payer: Medicare Other | Admitting: Internal Medicine

## 2015-08-24 VITALS — BP 110/64 | HR 100 | Temp 98.2°F | Resp 12 | Ht 61.0 in | Wt 113.0 lb

## 2015-08-24 DIAGNOSIS — R258 Other abnormal involuntary movements: Secondary | ICD-10-CM

## 2015-08-24 DIAGNOSIS — S0101XS Laceration without foreign body of scalp, sequela: Secondary | ICD-10-CM | POA: Diagnosis not present

## 2015-08-24 DIAGNOSIS — M79602 Pain in left arm: Secondary | ICD-10-CM | POA: Insufficient documentation

## 2015-08-24 DIAGNOSIS — E559 Vitamin D deficiency, unspecified: Secondary | ICD-10-CM | POA: Diagnosis not present

## 2015-08-24 DIAGNOSIS — Z720 Tobacco use: Secondary | ICD-10-CM

## 2015-08-24 DIAGNOSIS — R636 Underweight: Secondary | ICD-10-CM

## 2015-08-24 DIAGNOSIS — Z23 Encounter for immunization: Secondary | ICD-10-CM

## 2015-08-24 DIAGNOSIS — R253 Fasciculation: Secondary | ICD-10-CM

## 2015-08-24 DIAGNOSIS — R5383 Other fatigue: Secondary | ICD-10-CM

## 2015-08-24 DIAGNOSIS — M79601 Pain in right arm: Secondary | ICD-10-CM | POA: Insufficient documentation

## 2015-08-24 LAB — COMPREHENSIVE METABOLIC PANEL
ALT: 17 U/L (ref 0–35)
AST: 24 U/L (ref 0–37)
Albumin: 4.7 g/dL (ref 3.5–5.2)
Alkaline Phosphatase: 56 U/L (ref 39–117)
BUN: 9 mg/dL (ref 6–23)
CHLORIDE: 103 meq/L (ref 96–112)
CO2: 29 meq/L (ref 19–32)
Calcium: 9.6 mg/dL (ref 8.4–10.5)
Creatinine, Ser: 0.82 mg/dL (ref 0.40–1.20)
GFR: 73.73 mL/min (ref >60.00)
Glucose, Bld: 92 mg/dL (ref 70–99)
Potassium: 4 mEq/L (ref 3.5–5.1)
Sodium: 141 mEq/L (ref 135–145)
Total Bilirubin: 0.5 mg/dL (ref 0.2–1.2)
Total Protein: 6.9 g/dL (ref 6.0–8.3)

## 2015-08-24 LAB — MAGNESIUM: Magnesium: 1.8 mg/dL (ref 1.5–2.5)

## 2015-08-24 LAB — VITAMIN D 25 HYDROXY (VIT D DEFICIENCY, FRACTURES): VITD: 19.14 ng/mL — ABNORMAL LOW (ref 30.00–100.00)

## 2015-08-24 NOTE — Patient Instructions (Signed)
I want you to start drinking a protein shake in the morning Premier protein  Has a wonderful chocolate flavored hi protein low carb drink that has everything you need.   Checking Vit d and calcium level today

## 2015-08-24 NOTE — Progress Notes (Signed)
Pre-visit discussion using our clinic review tool. No additional management support is needed unless otherwise documented below in the visit note.  

## 2015-08-24 NOTE — Progress Notes (Signed)
Subjective:  Patient ID: Tamara [name], female    DOB: 04/08/1946  Age: 69 y.o. MRN: [id number]  CC: The primary encounter diagnosis was Vitamin D deficiency. Diagnoses of Muscle twitching, Laceration of scalp without complication, sequela, Tobacco abuse, Underweight, and Caregiver with fatigue were also pertinent to this visit.  HPI Tamara [name] presents for follow up on multiple issues including forehead/scalp laceration  In August resulting from a fall off a stopladder.  August 22 stitches removed from forehead,  Thinks there is still one stitch retained because she has pulled on the end of it . It is not painful or red or draining.   Left wrist pain:  She was referred for PT but never went bc she decided the pain was from her middle finger sprain. She is scheduled to have surgery on her middle finger, left hand for trigger finger repaired  By Margaretann Loveless  In the next several days.   Underweight;  Chronic, with patient worried that her 3 lb weight gain is making her fat.  Diet discussed,  She typically skips breakfast.  Diet is not healthy.    Outpatient Prescriptions Prior to Visit  Medication Sig Dispense Refill  . acyclovir (ZOVIRAX) 400 MG tablet Take 1 tablet (400 mg total) by mouth 3 (three) times daily. 21 tablet 0  . clonazePAM (KLONOPIN) 0.5 MG tablet TAKE 1 TABLET BY MOUTH ONCE DAILY AS NEEDED FOR ANXIETY. 30 tablet 2  . cyanocobalamin (,VITAMIN B-12,) 1000 MCG/ML injection 1000 mcg IM injection weekly 10 mL 3  . gabapentin (NEURONTIN) 100 MG capsule Take 300 mg by mouth 2 (two) times daily.     Marland Kitchen HYDROcodone-acetaminophen (NORCO) 10-325 MG per tablet Take 1 tablet by mouth every 6 (six) hours as needed. 15 tablet 0  . meloxicam (MOBIC) 15 MG tablet Take 1 tablet (15 mg total) by mouth daily. 30 tablet 0  . nortriptyline (PAMELOR) 25 MG capsule TAKE 1 CAPSULE BY MOUTH EACH NIGHT AT BEDTIME. (Patient taking differently: Take 25 mg by mouth at bedtime. ) 90 capsule 3  .  sertraline (ZOLOFT) 100 MG tablet Take 1 tablet (100 mg total) by mouth daily. 30 tablet 6  . Syringe, Disposable, 3 ML MISC For use with B12 injections weekly/monthly 25 each 0  . traZODone (DESYREL) 50 MG tablet TAKE 1/2 OR 1 TABLET BY MOUTH AT BEDTIMEAS NEEDED FOR SLEEP. 90 tablet 2  . oxyCODONE-acetaminophen (ROXICET) 5-325 MG per tablet Take 1 tablet by mouth every 6 (six) hours as needed. 9 tablet 0   No facility-administered medications prior to visit.    Review of Systems;  Patient denies headache, fevers, malaise, unintentional weight loss, skin rash, eye pain, sinus congestion and sinus pain, sore throat, dysphagia,  hemoptysis , cough, dyspnea, wheezing, chest pain, palpitations, orthopnea, edema, abdominal pain, nausea, melena, diarrhea, constipation, flank pain, dysuria, hematuria, urinary  Frequency, nocturia, numbness, tingling, seizures,  Focal weakness, Loss of consciousness,  Tremor, insomnia, depression, anxiety, and suicidal ideation.      Objective:  BP 110/64 mmHg  Pulse 100  Temp(Src) 98.2 F (36.8 C) (Oral)  Resp 12  Ht 5' 1"  (1.549 m)  Wt 113 lb (51.256 kg)  BMI 21.36 kg/m2  SpO2 95%  BP Readings from Last 3 Encounters:  08/24/15 110/64  07/03/15 124/74  06/27/15 117/72    Wt Readings from Last 3 Encounters:  08/24/15 113 lb (51.256 kg)  07/03/15 114 lb 4 oz (51.823 kg)  06/27/15 113 lb (51.256 kg)  General appearance: alert, cooperative and appears stated age Ears: normal TM's and external ear canals both ears Throat: lips, mucosa, and tongue normal; teeth and gums normal Neck: no adenopathy, no carotid bruit, supple, symmetrical, trachea midline and thyroid not enlarged, symmetric, no tenderness/mass/nodules Back: symmetric, no curvature. ROM normal. No CVA tenderness. Lungs: clear to auscultation bilaterally Heart: regular rate and rhythm, S1, S2 normal, no murmur, click, rub or gallop Abdomen: soft, non-tender; bowel sounds normal; no masses,   no organomegaly Pulses: 2+ and symmetric Skin: Skin color, texture, turgor normal. No rashes or lesions Lymph nodes: Cervical, supraclavicular, and axillary nodes normal.  No results found for: HGBA1C  Lab Results  Component Value Date   CREATININE 0.82 08/24/2015   CREATININE 0.77 03/24/2015   CREATININE 0.74 12/12/2014    Lab Results  Component Value Date   WBC 8.9 03/24/2015   HGB 13.2 03/24/2015   HCT 40.3 03/24/2015   PLT 316 03/24/2015   GLUCOSE 92 08/24/2015   CHOL 209* 12/12/2014   TRIG 118.0 12/12/2014   HDL 75.40 12/12/2014   LDLDIRECT 121.1 01/26/2014   LDLCALC 110* 12/12/2014   ALT 17 08/24/2015   AST 24 08/24/2015   NA 141 08/24/2015   K 4.0 08/24/2015   CL 103 08/24/2015   CREATININE 0.82 08/24/2015   BUN 9 08/24/2015   CO2 29 08/24/2015   TSH 2.470 12/12/2014   INR 0.96 03/24/2015    Dg Wrist Complete Left  06/27/2015  CLINICAL DATA:  Patient fell off of a stool while hanging a picture at home earlier today, injuring the left wrist. Initial encounter. EXAM: LEFT WRIST - COMPLETE 3+ VIEW COMPARISON:  None. FINDINGS: No evidence of acute fracture or dislocation. Joint spaces well preserved for age. Mild osseous demineralization. Tiny accessory ossicle or calcification in the triangular fibrocartilage complex adjacent to the ulnar styloid. IMPRESSION: No acute osseous abnormality. Electronically Signed   By: Evangeline Dakin M.D.   On: 06/27/2015 17:41   Ct Head Wo Contrast  06/27/2015  CLINICAL DATA:  Fall. Head injury. Laceration noted on forehead. Neck pain. Prior cervical fusion. EXAM: CT HEAD WITHOUT CONTRAST CT CERVICAL SPINE WITHOUT CONTRAST TECHNIQUE: Multidetector CT imaging of the head and cervical spine was performed following the standard protocol without intravenous contrast. Multiplanar CT image reconstructions of the cervical spine were also generated. COMPARISON:  Brain MR of 12/24/2014. Cervical spine plain films of 05/25/2010. FINDINGS: CT HEAD  FINDINGS Sinuses/Soft tissues: Soft tissue injury about the left frontal scalp. No underlying skull fracture. Clear paranasal sinuses and mastoid air cells. Intracranial: No mass lesion, hemorrhage, hydrocephalus, acute infarct, intra-axial, or extra-axial fluid collection. CT CERVICAL SPINE FINDINGS Spinal visualization through the bottom of T2. Prevertebral soft tissues are within normal limits. Status post anterior fixation at C5-7 without acute hardware complication. Centrilobular emphysema, without apical pneumothorax. Skull base intact. Maintenance of vertebral body height and alignment. Facets are well-aligned. Coronal reformats demonstrate a normal C1-C2 articulation. IMPRESSION: 1. Left frontal scalp soft tissue injury, without acute intracranial abnormality. 2. No acute findings in the cervical spine. Anterior fixation at C5-7, without acute complication Electronically Signed   By: Abigail Miyamoto M.D.   On: 06/27/2015 18:34   Ct Cervical Spine Wo Contrast  06/27/2015  CLINICAL DATA:  Fall. Head injury. Laceration noted on forehead. Neck pain. Prior cervical fusion. EXAM: CT HEAD WITHOUT CONTRAST CT CERVICAL SPINE WITHOUT CONTRAST TECHNIQUE: Multidetector CT imaging of the head and cervical spine was performed following the standard protocol without intravenous contrast.  Multiplanar CT image reconstructions of the cervical spine were also generated. COMPARISON:  Brain MR of 12/24/2014. Cervical spine plain films of 05/25/2010. FINDINGS: CT HEAD FINDINGS Sinuses/Soft tissues: Soft tissue injury about the left frontal scalp. No underlying skull fracture. Clear paranasal sinuses and mastoid air cells. Intracranial: No mass lesion, hemorrhage, hydrocephalus, acute infarct, intra-axial, or extra-axial fluid collection. CT CERVICAL SPINE FINDINGS Spinal visualization through the bottom of T2. Prevertebral soft tissues are within normal limits. Status post anterior fixation at C5-7 without acute hardware  complication. Centrilobular emphysema, without apical pneumothorax. Skull base intact. Maintenance of vertebral body height and alignment. Facets are well-aligned. Coronal reformats demonstrate a normal C1-C2 articulation. IMPRESSION: 1. Left frontal scalp soft tissue injury, without acute intracranial abnormality. 2. No acute findings in the cervical spine. Anterior fixation at C5-7, without acute complication Electronically Signed   By: Abigail Miyamoto M.D.   On: 06/27/2015 18:34    Assessment & Plan:   Problem List Items Addressed This Visit    Tobacco abuse    Smoking cessation instruction/counseling given:  counseled patient on the dangers of tobacco use, advised patient to stop smoking, and reviewed strategies to maximize success      Laceration of scalp without complication    Now well healed.  There is a retained suture that cannot be removed without reopening the wound as the knot is subcutaneous,  Suture was trimmed.       Underweight     I have reviewed her diet and lack of appetite ,and recommended that she increase her protein and fat intake with a daily morning protein shake.  Albumin, and lytes are normal but her vitamin D is low.       Caregiver with fatigue    A total of 30 minutes of face to face time was spent with patient more than half of which was spent in counselling and coordination of care .She is emotionally distached from her second husband, whom she continues to live with and care for due to hi declining health.  A substantial number of her somatic complaints are due to her unhappiness and unresolved conflict.       Vitamin D deficiency - Primary    Treating with Drisdol weekly dose x 3 months for level of192, followed by daily dosing of 2,000 units        Other Visit Diagnoses    Muscle twitching        Relevant Orders    Comprehensive metabolic panel (Completed)    Magnesium (Completed)       I have discontinued Ms. Nehring's oxyCODONE-acetaminophen. I  am also having her start on ergocalciferol. Additionally, I am having her maintain her sertraline, cyanocobalamin, nortriptyline, gabapentin, HYDROcodone-acetaminophen, acyclovir, Syringe (Disposable), meloxicam, traZODone, and clonazePAM.  Meds ordered this encounter  Medications  . ergocalciferol (DRISDOL) 50000 UNITS capsule    Sig: Take 1 capsule (50,000 Units total) by mouth once a week.    Dispense:  12 capsule    Refill:  0    Medications Discontinued During This Encounter  Medication Reason  . oxyCODONE-acetaminophen (ROXICET) 5-325 MG per tablet Change in therapy    Follow-up: No Follow-up on file.   Crecencio Mc, MD

## 2015-08-25 HISTORY — PX: TRIGGER FINGER RELEASE: SHX641

## 2015-08-27 ENCOUNTER — Encounter: Payer: Self-pay | Admitting: Internal Medicine

## 2015-08-27 DIAGNOSIS — R636 Underweight: Secondary | ICD-10-CM | POA: Insufficient documentation

## 2015-08-27 DIAGNOSIS — E559 Vitamin D deficiency, unspecified: Secondary | ICD-10-CM | POA: Insufficient documentation

## 2015-08-27 DIAGNOSIS — R5383 Other fatigue: Secondary | ICD-10-CM | POA: Insufficient documentation

## 2015-08-27 MED ORDER — ERGOCALCIFEROL 1.25 MG (50000 UT) PO CAPS: 50000.0000 [IU] | ORAL_CAPSULE | ORAL | Status: DC

## 2015-08-27 NOTE — Assessment & Plan Note (Signed)
Now well healed.  There is a retained suture that cannot be removed without reopening the wound as the knot is subcutaneous,  Suture was trimmed.

## 2015-08-27 NOTE — Assessment & Plan Note (Signed)
Smoking cessation instruction/counseling given:  counseled patient on the dangers of tobacco use, advised patient to stop smoking, and reviewed strategies to maximize success 

## 2015-08-27 NOTE — Assessment & Plan Note (Signed)
Treating with Drisdol weekly dose x 3 months for level of192, followed by daily dosing of 2,000 units

## 2015-08-27 NOTE — Assessment & Plan Note (Addendum)
A total of 30 minutes of face to face time was spent with patient more than half of which was spent in counselling and coordination of care .She is emotionally distached from her second husband, whom she continues to live with and care for due to hi declining health.  A substantial number of her somatic complaints are due to her unhappiness and unresolved conflict.

## 2015-08-27 NOTE — Assessment & Plan Note (Addendum)
I have reviewed her diet and lack of appetite ,and recommended that she increase her protein and fat intake with a daily morning protein shake.  Albumin, and lytes are normal but her vitamin D is low.

## 2015-08-28 ENCOUNTER — Other Ambulatory Visit: Payer: Self-pay | Admitting: Internal Medicine

## 2015-09-18 HISTORY — PX: TRIGGER FINGER RELEASE: SHX641

## 2015-09-28 IMAGING — MR MRA HEAD WITHOUT CONTRAST
1 series · 23 of 48 positions shown · non-contrast
Comparison: MRI brain 05/16/2010

CLINICAL DATA: Headaches. Visual changes. Dizziness. Bilateral arm
pain.

EXAM:
MRA HEAD WITHOUT CONTRAST
TECHNIQUE: Angiographic images of the Circle of Willis were obtained using MRA
technique without intravenous contrast.

[Series 3: TOF · axial · non-contrast · 0.7mm · 0.37mm/px · z∈[-41,+45]mm · 23 of 130 slices shown]
[im 1/130]
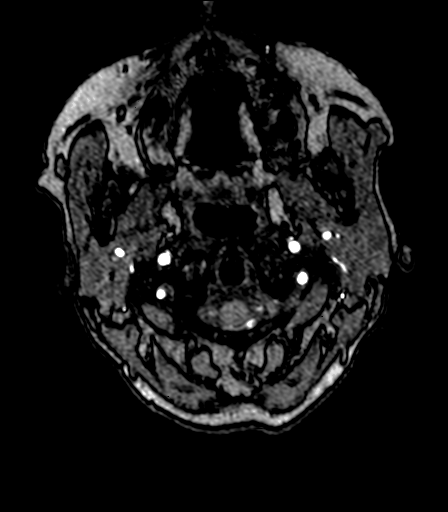
[im 3/130]
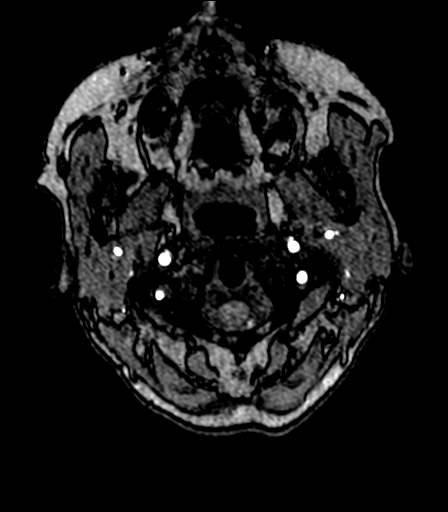
[im 6/130]
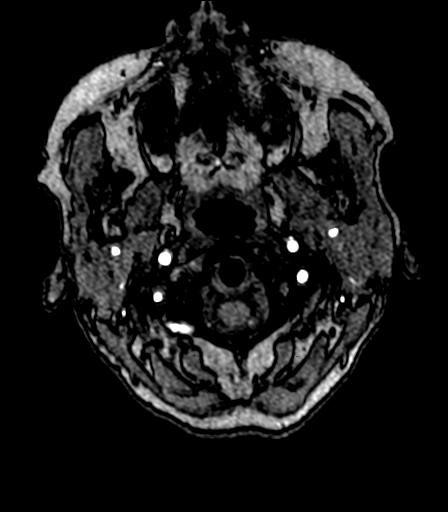
[im 9/130]
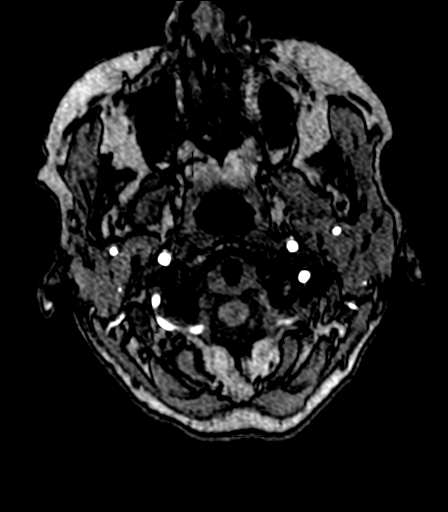
[im 11/130]
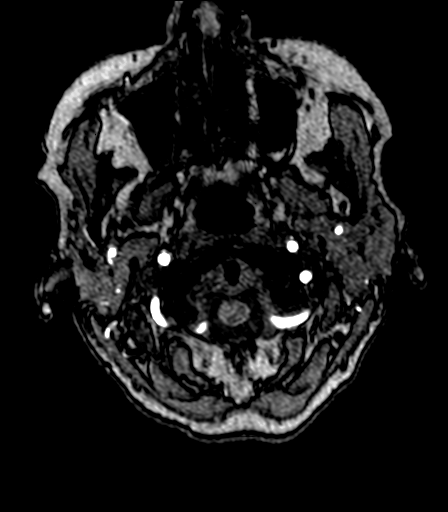
[im 14/130]
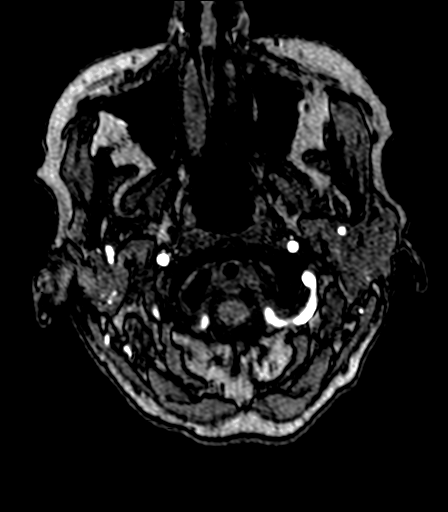
[im 17/130]
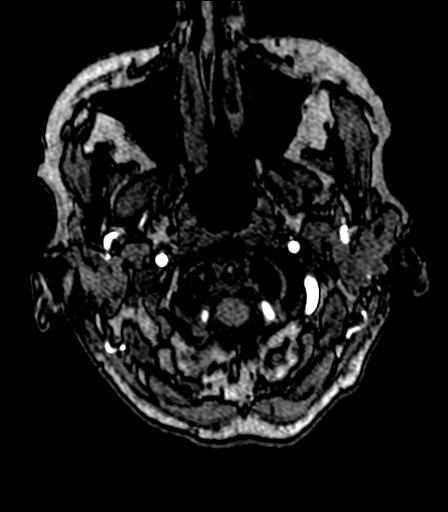
[im 20/130]
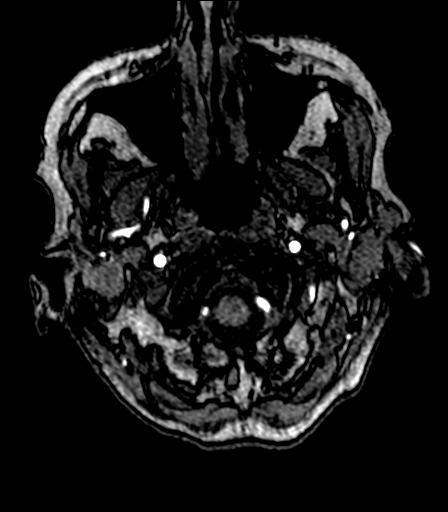
[im 22/130]
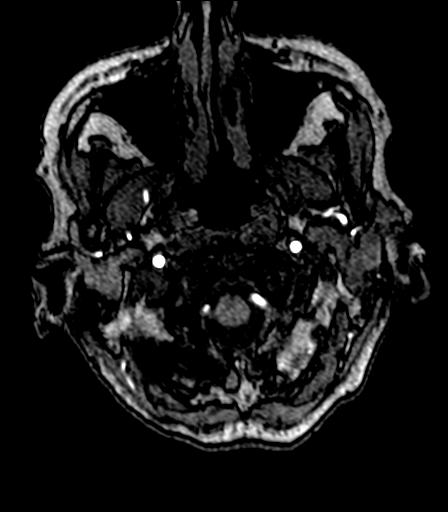
[im 25/130]
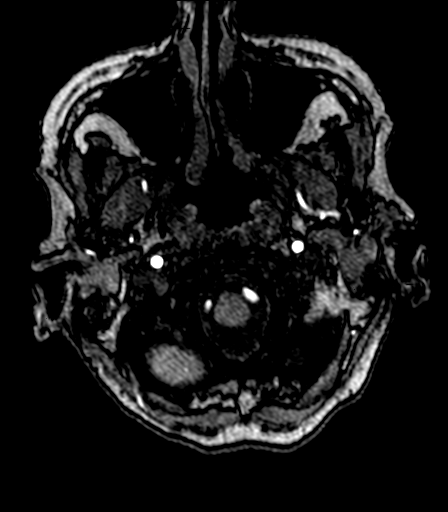
[im 28/130]
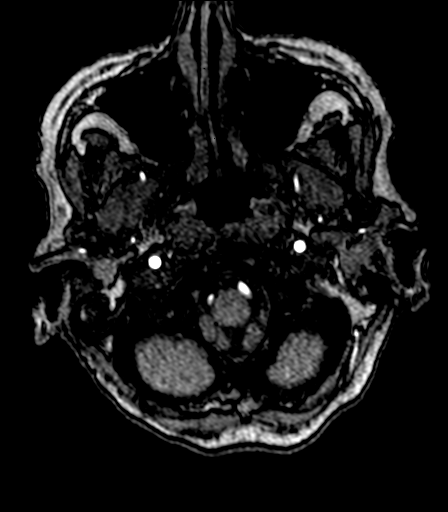
[im 31/130]
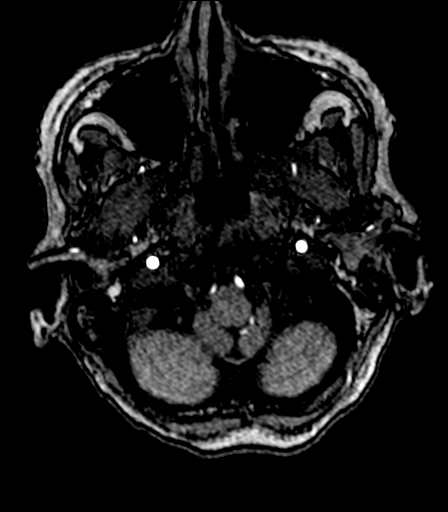
[im 33/130]
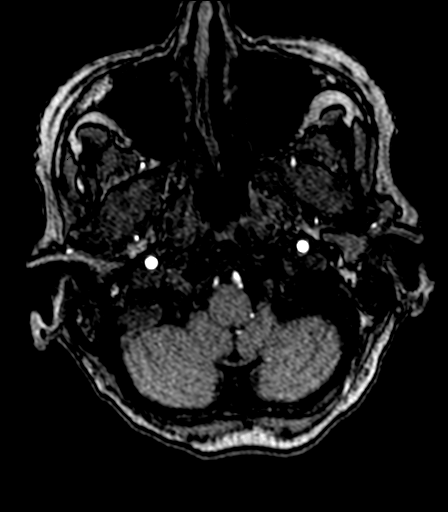
[im 36/130]
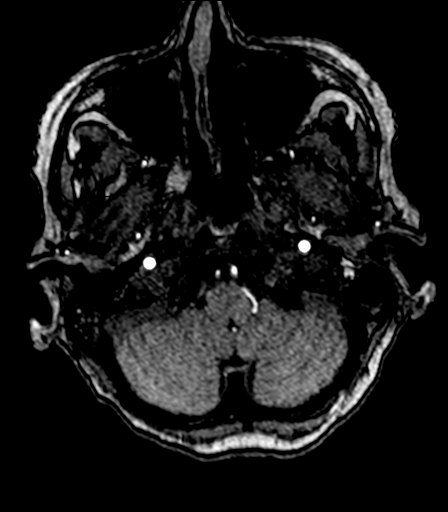
[im 39/130]
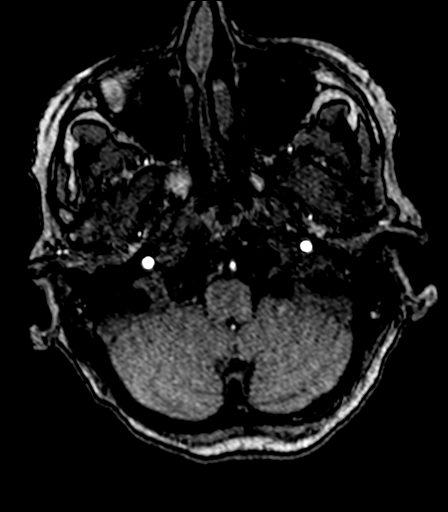
[im 42/130]
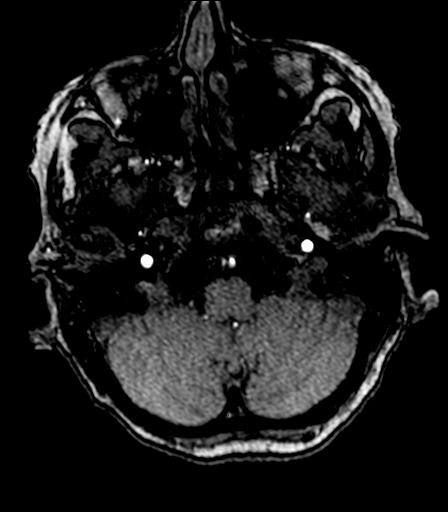
[im 58/130]
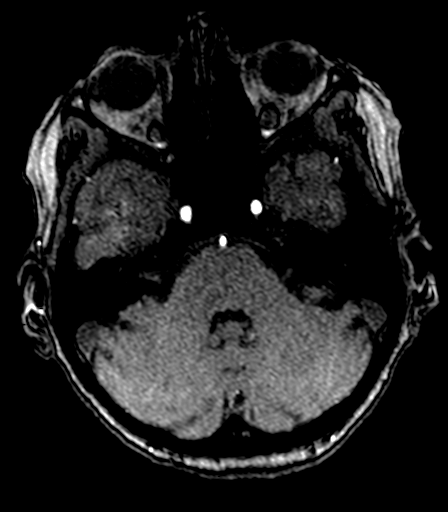
[im 66/130]
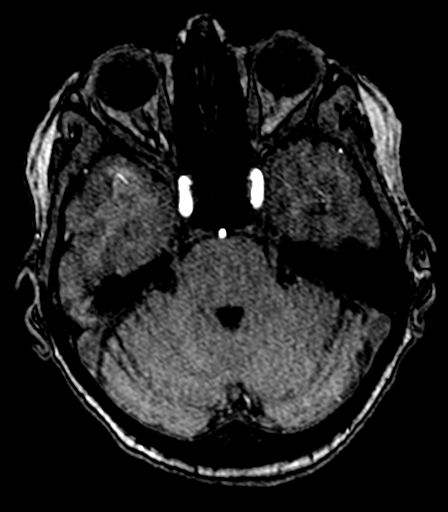
[im 75/130]
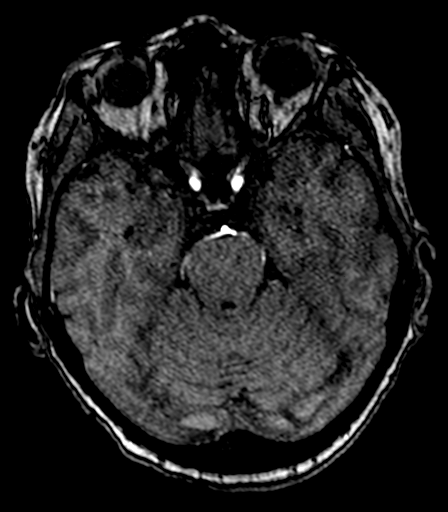
[im 91/130]
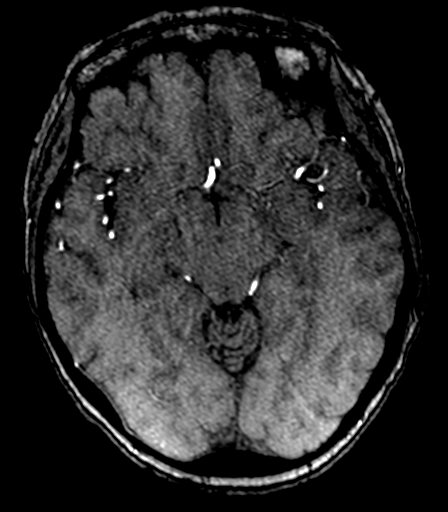
[im 108/130]
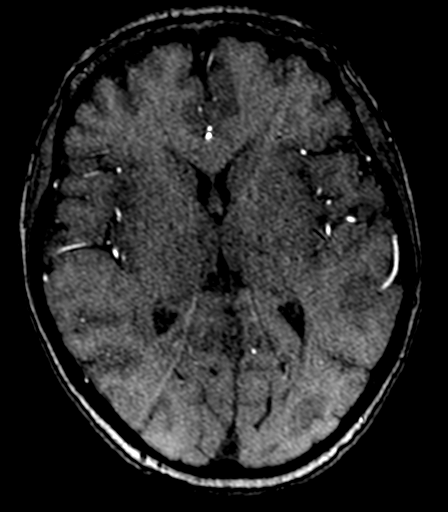
[im 110/130]
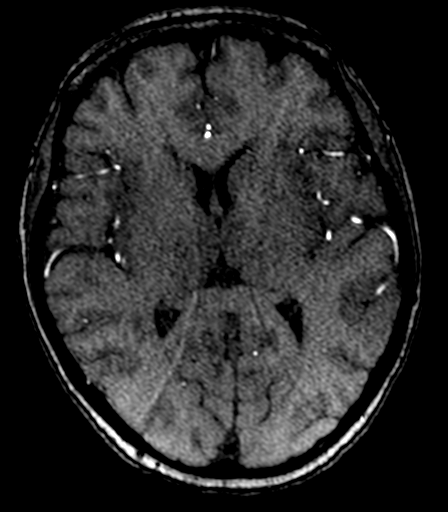
[im 124/130]
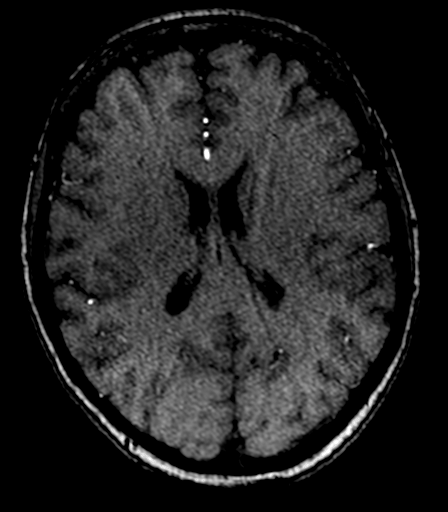

[23 of 48 positions shown; findings below may reference images not displayed]

FINDINGS: The internal carotid arteries are within normal limits from the high
cervical segments through the ICA termini bilaterally. The A1 and M1
segments are normal. The anterior communicating artery is patent.
MCA bifurcations are intact. There is mild attenuation of distal ACA
and MCA branch vessels bilaterally.

The left vertebral artery is the dominant vessel. There is mild
narrowing of the distal right vertebral artery. The left PICA origin
is visualized and normal. Both posterior cerebral arteries originate
from the basilar tip. The right posterior communicating artery
contributes. There is some attenuation of distal PCA branch vessels
bilaterally.
IMPRESSION: 1. Mild to moderate narrowing of the nondominant distal right
vertebral artery.
2. No other significant proximal stenosis, aneurysm, or branch
vessel occlusion.
3. Mild diffuse small vessel disease compatible with signal changes
seen on the prior MRI.

## 2015-10-09 ENCOUNTER — Ambulatory Visit: Payer: Medicare Other | Admitting: Family Medicine

## 2015-10-09 DIAGNOSIS — Z0289 Encounter for other administrative examinations: Secondary | ICD-10-CM

## 2015-10-24 ENCOUNTER — Encounter: Payer: Self-pay | Admitting: *Deleted

## 2015-10-24 ENCOUNTER — Ambulatory Visit
Admission: EM | Admit: 2015-10-24 | Discharge: 2015-10-24 | Disposition: A | Discharge status: Home / Self Care | Payer: Medicare Other | Attending: Family Medicine | Admitting: Family Medicine

## 2015-10-24 ENCOUNTER — Ambulatory Visit (INDEPENDENT_AMBULATORY_CARE_PROVIDER_SITE_OTHER): Payer: Medicare Other

## 2015-10-24 DIAGNOSIS — S300XXA Contusion of lower back and pelvis, initial encounter: Secondary | ICD-10-CM

## 2015-10-24 DIAGNOSIS — S2231XA Fracture of one rib, right side, initial encounter for closed fracture: Secondary | ICD-10-CM

## 2015-10-24 DIAGNOSIS — S0093XA Contusion of unspecified part of head, initial encounter: Secondary | ICD-10-CM

## 2015-10-24 DIAGNOSIS — J019 Acute sinusitis, unspecified: Secondary | ICD-10-CM | POA: Diagnosis not present

## 2015-10-24 MED ORDER — KETOROLAC TROMETHAMINE 60 MG/2ML IM SOLN
60.0000 mg | Freq: Once | INTRAMUSCULAR | Status: AC
  Administered 2015-10-24: 60 mg via INTRAMUSCULAR

## 2015-10-24 MED ORDER — MELOXICAM 7.5 MG PO TABS: 7.5000 mg | ORAL_TABLET | Freq: Every day | ORAL | Status: DC

## 2015-10-24 MED ORDER — AMOXICILLIN-POT CLAVULANATE 875-125 MG PO TABS: 1.0000 | ORAL_TABLET | Freq: Two times a day (BID) | ORAL | Status: DC

## 2015-10-24 MED ORDER — HYDROCODONE-ACETAMINOPHEN 5-325 MG PO TABS: 1.0000 | ORAL_TABLET | Freq: Three times a day (TID) | ORAL | Status: DC | PRN

## 2015-10-24 MED ORDER — FEXOFENADINE-PSEUDOEPHED ER 180-240 MG PO TB24: 1.0000 | ORAL_TABLET | Freq: Every day | ORAL | Status: DC

## 2015-10-24 MED ORDER — TIZANIDINE HCL 4 MG PO TABS: 4.0000 mg | ORAL_TABLET | Freq: Three times a day (TID) | ORAL | Status: DC | PRN

## 2015-10-24 NOTE — ED Provider Notes (Signed)
CSN: [id number]     Arrival date & time 10/24/15  1051 History   First MD Initiated Contact with Patient 10/24/15 1220    Nurses notes were reviewed.  Chief Complaint  Patient presents with  . Head Injury  . Tailbone Pain  . Dizziness   Patient reports falling this morning. She reports losing her vision. She is not worried about losing her vision since she has vascular ocular migraines and she'll have blurred visions. She did states it took a little longer for her vision to come back and usual but once again she still not worried about that. She states that she lost consciousness she thinks and she is pain over her mid forehead and pain over or above her left eyebrow as well. She states that she fell off a barstool about 4 months ago and was seen at [facility] ED required about 16's sutures and had a CT scan that time. I have expecting to her that we do not have CT scan capabilities here at that time and that she needs to go to the ER for evaluation for concussion.  She also reports hurting her neck on the right side and right lower ribs she also reports hurting over her right buttocks and right sacral area as well.   She is also concerned about a sinus infection and she states that last 2 weeks she's had nasal congestion and cough.  (Consider location/radiation/quality/duration/timing/severity/associated sxs/prior Treatment) Patient is a 69 y.o. female presenting with head injury, dizziness, and URI. The history is provided by the patient. No language interpreter was used.  Head Injury Location:  Frontal Time since incident:  4 hours Mechanism of injury: direct blow   Pain details:    Quality:  Throbbing and sharp   Severity:  Moderate   Timing:  Constant   Progression:  Worsening Chronicity:  New Relieved by:  Nothing Worsened by:  Nothing tried Ineffective treatments:  None tried Associated symptoms: blurred vision, headache, loss of consciousness and neck pain   Associated symptoms: no  difficulty breathing, no disorientation, no memory loss, no nausea, no numbness, no seizures and no vomiting   Dizziness Associated symptoms: headaches   Associated symptoms: no nausea and no vomiting   URI Presenting symptoms: congestion, facial pain and rhinorrhea   Presenting symptoms: no sore throat   Severity:  Moderate Onset quality:  Sudden Timing:  Constant Progression:  Worsening Chronicity:  New Relieved by:  Nothing Ineffective treatments:  None tried Associated symptoms: headaches and neck pain     Past Medical History  Diagnosis Date  . Osteoporosis   . Chronic abdominal pain   . Crohn's disease ([place])   . History of DVT (deep vein thrombosis) 01/2007    post operative right leg, s/p vena cava filter  . Bowel obstruction ([place])     s/p laparotomy/ LOA 3/08, prior reversal of jejunal bypasss 2004   Past Surgical History  Procedure Laterality Date  . Cholecystectomy    . Cervical fusion  2005    C6-7, anterior approach  . 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 Rotator cuff repair      right  . Appendectomy    . Gyn surgery      hysterectomy  . Laparotomy  01/30/07    for bowel obstruction with LOA  . Cataract extraction    . Nissen fundoplication    . Abdominal hysterectomy     Family History  Problem Relation Age of  Onset  . Diabetes Mother     type 2  . Heart disease Mother   . Hypertension Mother   . Coronary artery disease Father    Social History  Substance Use Topics  . Smoking status: Former Smoker    Quit date: 12/13/2014  . Smokeless tobacco: Never Used     Comment: half to one pack of cigs per day  . Alcohol Use: No   OB History    No data available     Review of Systems  HENT: Positive for congestion and rhinorrhea. Negative for sore throat.   Eyes: Positive for blurred vision.  Gastrointestinal: Negative for nausea and vomiting.  Musculoskeletal: Positive for neck pain.  Neurological: Positive for dizziness, loss of  consciousness and headaches. Negative for seizures and numbness.  Psychiatric/Behavioral: Negative for memory loss.  All other systems reviewed and are negative.   Allergies  Sulfa antibiotics  Home Medications   Prior to Admission medications   Medication Sig Start Date End Date Taking? Authorizing Provider  clonazePAM (KLONOPIN) 0.5 MG tablet TAKE 1 TABLET BY MOUTH ONCE DAILY AS NEEDED FOR ANXIETY. 08/09/15  Yes Crecencio Mc, MD  cyanocobalamin (,VITAMIN B-12,) 1000 MCG/ML injection INJECT 1 VIAL (73m) INTRAMUSCULARLY ONCEPER WEEK. 08/28/15  Yes TCrecencio Mc MD  ergocalciferol (DRISDOL) 50000 UNITS capsule Take 1 capsule (50,000 Units total) by mouth once a week. 08/27/15  Yes TCrecencio Mc MD  gabapentin (NEURONTIN) 300 MG capsule Take 300 mg by mouth daily.   Yes Historical Provider, MD  nortriptyline (PAMELOR) 25 MG capsule TAKE 1 CAPSULE BY MOUTH EACH NIGHT AT BEDTIME. Patient taking differently: Take 25 mg by mouth at bedtime.  01/09/15  Yes TCrecencio Mc MD  sertraline (ZOLOFT) 100 MG tablet Take 1 tablet (100 mg total) by mouth daily. 04/08/14  Yes TCrecencio Mc MD  Syringe, Disposable, 3 ML MISC For use with B12 injections weekly/monthly 04/25/15  Yes TCrecencio Mc MD  traZODone (DESYREL) 50 MG tablet TAKE 1/2 OR 1 TABLET BY MOUTH AT BEDTIMEAS NEEDED FOR SLEEP. 07/26/15  Yes TCrecencio Mc MD  acyclovir (ZOVIRAX) 400 MG tablet Take 1 tablet (400 mg total) by mouth 3 (three) times daily. 04/04/15   TCrecencio Mc MD  amoxicillin-clavulanate (AUGMENTIN) 875-125 MG tablet Take 1 tablet by mouth 2 (two) times daily. 10/24/15   EFrederich Cha MD  fexofenadine-pseudoephedrine (ALLEGRA-D ALLERGY & CONGESTION) 180-240 MG 24 hr tablet Take 1 tablet by mouth daily. 10/24/15   EFrederich Cha MD  gabapentin (NEURONTIN) 100 MG capsule Take 300 mg by mouth 2 (two) times daily.     Historical Provider, MD  HYDROcodone-acetaminophen (NORCO) 10-325 MG per tablet Take 1 tablet by mouth every 6  (six) hours as needed. 03/14/15   CArlyss Repress PA-C  HYDROcodone-acetaminophen (NORCO) 5-325 MG tablet Take 1 tablet by mouth every 8 (eight) hours as needed for moderate pain. 10/24/15   EFrederich Cha MD  meloxicam (MOBIC) 15 MG tablet Take 1 tablet (15 mg total) by mouth daily. 07/03/15   TCrecencio Mc MD  meloxicam (MOBIC) 7.5 MG tablet Take 1 tablet (7.5 mg total) by mouth daily. 10/24/15   EFrederich Cha MD  tiZANidine (ZANAFLEX) 4 MG tablet Take 1 tablet (4 mg total) by mouth every 8 (eight) hours as needed for muscle spasms. Recommend if sedation occurs just take at night 10/24/15   EFrederich Cha MD   Meds Ordered and Administered this Visit   Medications  ketorolac (TORADOL)  injection 60 mg (60 mg Intramuscular Given 10/24/15 1250)    BP 122/65 mmHg  Pulse 98  Temp(Src) 98.2 F (36.8 C) (Oral)  Resp 18  Ht 5' 2"  (1.575 m)  Wt 107 lb (48.535 kg)  BMI 19.57 kg/m2  SpO2 100% No data found.   Physical Exam  Constitutional: She is oriented to person, place, and time. She appears well-developed and well-nourished. She appears distressed.  HENT:  Head: Head is with contusion. Head is without abrasion and without laceration.    Right Ear: Hearing, tympanic membrane, external ear and ear canal normal.  Left Ear: Hearing, tympanic membrane, external ear and ear canal normal.  Nose: Mucosal edema present. No rhinorrhea. Right sinus exhibits maxillary sinus tenderness. Left sinus exhibits maxillary sinus tenderness.  Mouth/Throat: Uvula is midline and oropharynx is clear and moist. No posterior oropharyngeal erythema.  Eyes: EOM are normal. Pupils are equal, round, and reactive to light. Right conjunctiva is not injected. Left conjunctiva is injected.  Neck: Neck supple.  Musculoskeletal: Normal range of motion. She exhibits tenderness. She exhibits no edema.       Cervical back: She exhibits tenderness and pain. She exhibits no edema, no deformity and no laceration.       Lumbar  back: She exhibits tenderness, bony tenderness, pain and spasm. She exhibits normal range of motion, no swelling and no laceration.       Back:  Lymphadenopathy:    She has no cervical adenopathy.  Neurological: She is alert and oriented to person, place, and time. No cranial nerve deficit.  Skin: Skin is intact. No rash noted. She is not diaphoretic. No erythema.  Psychiatric: She has a normal mood and affect.  Vitals reviewed.   ED Course  Procedures (including critical care time)  Labs Review Labs Reviewed - No data to display  Imaging Review Dg Ribs Unilateral W/chest Right  10/24/2015  CLINICAL DATA:  [place] today and injured right ribs. EXAM: RIGHT RIBS AND CHEST - 3+ VIEW COMPARISON:  Chest x-ray 10/07/2014 FINDINGS: The cardiac silhouette, mediastinal and hilar contours are normal. There is mild tortuosity and calcification of the thoracic aorta. The lungs are clear. No pleural effusion or pneumothorax Dedicated views of the right ribs demonstrate minimally displaced fractures of the ninth and tenth anterior ribs. IMPRESSION: Minimally displaced fractures of the right ninth and tenth anterior ribs. No acute cardiopulmonary findings. Electronically Signed   By: Marijo Sanes M.D.   On: 10/24/2015 14:02   Dg Cervical Spine Complete  10/24/2015  CLINICAL DATA:  Syncope and fall this morning with onset of neck pain. Initial encounter. EXAM: CERVICAL SPINE - COMPLETE 4+ VIEW COMPARISON:  Head and cervical spine CT scans 06/29/2015. FINDINGS: The patient is status post C5-7 ACDF. The levels are solidly fused. Trace anterolisthesis C7 on T1 due to facet degenerative disease is unchanged. Alignment is otherwise normal. No fracture is identified. IMPRESSION: No acute abnormality. Status post C5-7 ACDF. Electronically Signed   By: Inge Rise M.D.   On: 10/24/2015 13:55   Dg Sacrum/coccyx  10/24/2015  CLINICAL DATA:  Fall this morning, tailbone pain EXAM: SACRUM AND COCCYX - 2+ VIEW  COMPARISON:  None. FINDINGS: There is no evidence of fracture or other focal bone lesions. There is diffuse osteopenia. Mild degenerative changes pubic symphysis. IMPRESSION: No acute fracture or subluxation. Diffuse osteopenia. Mild degenerative changes pubic symphysis. Electronically Signed   By: Lahoma Crocker M.D.   On: 10/24/2015 13:54     Visual Acuity Review  Right Eye Distance:   Left Eye Distance:   Bilateral Distance:    Right Eye Near:   Left Eye Near:    Bilateral Near:         MDM   1. Closed rib fracture, right, initial encounter   2. Head contusion, initial encounter   3. Coccyx contusion, initial encounter   4. Acute sinusitis, recurrence not specified, unspecified location      Patient fell this morning she reports having she thought some loss of consciousness and wanted to be evaluated for concussion. Explained to her we cannot evaluate for concussion fully without a CT scan. Offered let go to emergency room later evaluate her but she declines. Explained to her I can treat her for the other injuries but I cannot rule out a serious head injury. She definitely had a head injury with loss of consciousness she had a concussion. She wants to go ahead and be evaluated just before the injury to her ribs low back and neck.   Patient was informed she's got no changes on the cervical spine x-rays. She does have osteopenia and degenerative changes of the sacrum and coccyx but no fracture. The right ribs shows a fracture of the ninth and 10th anterior ribs. The Toradol injection did not help much. We'll place her on Mobic 7.5 mg 1 tablet day since she's allergic to sulfur and would not want to try the Celebrex. We'll place her on Zanaflex 1 tablet at night only. Hydrocodone will give her 30 her with the concussion that she had she needs use very sparingly and once again if confusion occurs or dizziness she needs to go to the ED for CT of her head.   For the sinus infection will treat  her with Augmentin 875 one tablet twice a day and Allegra-D.      Frederich Cha, MD 10/24/15 [phone]

## 2015-10-24 NOTE — Discharge Instructions (Signed)
Contusion A contusion is a deep bruise. Contusions happen when an injury causes bleeding under the skin. Symptoms of bruising include pain, swelling, and discolored skin. The skin may turn blue, purple, or yellow. HOME CARE   Rest the injured area.  If told, put ice on the injured area.  Put ice in a plastic bag.  Place a towel between your skin and the bag.  Leave the ice on for 20 minutes, 2-3 times per day.  If told, put light pressure (compression) on the injured area using an elastic bandage. Make sure the bandage is not too tight. Remove it and put it back on as told by your doctor.  If possible, raise (elevate) the injured area above the level of your heart while you are sitting or lying down.  Take over-the-counter and prescription medicines only as told by your doctor. GET HELP IF:  Your symptoms do not get better after several days of treatment.  Your symptoms get worse.  You have trouble moving the injured area. GET HELP RIGHT AWAY IF:   You have very bad pain.  You have a loss of feeling (numbness) in a hand or foot.  Your hand or foot turns pale or cold.   This information is not intended to replace advice given to you by your health care provider. Make sure you discuss any questions you have with your health care provider.   Document Released: 04/15/2008 Document Revised: 07/19/2015 Document Reviewed: 03/15/2015 Elsevier Interactive Patient Education 2016 [place].  Facial or Scalp Contusion A facial or scalp contusion is a deep bruise on the face or head. Injuries to the face and head generally cause a lot of swelling, especially around the eyes. Contusions are the result of an injury that caused bleeding under the skin. The contusion may turn blue, purple, or yellow. Minor injuries will give you a painless contusion, but more severe contusions may stay painful and swollen for a few weeks.  CAUSES  A facial or scalp contusion is caused by a blunt injury  or trauma to the face or head area.  SIGNS AND SYMPTOMS   Swelling of the injured area.   Discoloration of the injured area.   Tenderness, soreness, or pain in the injured area.  DIAGNOSIS  The diagnosis can be made by taking a medical history and doing a physical exam. An X-ray exam, CT scan, or MRI may be needed to determine if there are any associated injuries, such as broken bones (fractures). TREATMENT  Often, the best treatment for a facial or scalp contusion is applying cold compresses to the injured area. Over-the-counter medicines may also be recommended for pain control.  HOME CARE INSTRUCTIONS   Only take over-the-counter or prescription medicines as directed by your health care provider.   Apply ice to the injured area.   Put ice in a plastic bag.   Place a towel between your skin and the bag.   Leave the ice on for 20 minutes, 2-3 times a day.  SEEK MEDICAL CARE IF:  You have bite problems.   You have pain with chewing.   You are concerned about facial defects. SEEK IMMEDIATE MEDICAL CARE IF:  You have severe pain or a headache that is not relieved by medicine.   You have unusual sleepiness, confusion, or personality changes.   You throw up (vomit).   You have a persistent nosebleed.   You have double vision or blurred vision.   You have fluid drainage from your  nose or ear.   You have difficulty walking or using your arms or legs.  MAKE SURE YOU:   Understand these instructions.  Will watch your condition.  Will get help right away if you are not doing well or get worse.   This information is not intended to replace advice given to you by your health care provider. Make sure you discuss any questions you have with your health care provider.   Document Released: 12/05/2004 Document Revised: 11/18/2014 Document Reviewed: 06/10/2013 Elsevier Interactive Patient Education 2016 [place] Injury, Adult You have a head  injury. Headaches and throwing up (vomiting) are common after a head injury. It should be easy to wake up from sleeping. Sometimes you must stay in the hospital. Most problems happen within the first 24 hours. Side effects may occur up to 7-10 days after the injury.  WHAT ARE THE TYPES OF HEAD INJURIES? Head injuries can be as minor as a bump. Some head injuries can be more severe. More severe head injuries include:  A jarring injury to the brain (concussion).  A bruise of the brain (contusion). This mean there is bleeding in the brain that can cause swelling.  A cracked skull (skull fracture).  Bleeding in the brain that collects, clots, and forms a bump (hematoma). WHEN SHOULD I GET HELP RIGHT AWAY?   You are confused or sleepy.  You cannot be woken up.  You feel sick to your stomach (nauseous) or keep throwing up (vomiting).  Your dizziness or unsteadiness is getting worse.  You have very bad, lasting headaches that are not helped by medicine. Take medicines only as told by your doctor.  You cannot use your arms or legs like normal.  You cannot walk.  You notice changes in the black spots in the center of the colored part of your eye (pupil).  You have clear or bloody fluid coming from your nose or ears.  You have trouble seeing. During the next 24 hours after the injury, you must stay with someone who can watch you. This person should get help right away (call 911 in the [place] if you start to shake and are not able to control it (have seizures), you pass out, or you are unable to wake up. HOW CAN I PREVENT A HEAD INJURY IN THE FUTURE?  Wear seat belts.  Wear a helmet while bike riding and playing sports like football.  Stay away from dangerous activities around the house. WHEN CAN I RETURN TO NORMAL ACTIVITIES AND ATHLETICS? See your doctor before doing these activities. You should not do normal activities or play contact sports until 1 week after the following symptoms  have stopped:  Headache that does not go away.  Dizziness.  Poor attention.  Confusion.  Memory problems.  Sickness to your stomach or throwing up.  Tiredness.  Fussiness.  Bothered by bright lights or loud noises.  Anxiousness or depression.  Restless sleep. MAKE SURE YOU:   Understand these instructions.  Will watch your condition.  Will get help right away if you are not doing well or get worse.   This information is not intended to replace advice given to you by your health care provider. Make sure you discuss any questions you have with your health care provider.   Document Released: 10/10/2008 Document Revised: 11/18/2014 Document Reviewed: 07/05/2013 Elsevier Interactive Patient Education 2016 [place].  Rib Fracture A rib fracture is a break or crack in one of the bones of the  ribs. The ribs are like a cage that goes around your upper chest. A broken or cracked rib is often painful, but most do not cause other problems. Most rib fractures heal on their own in 1-3 months. HOME CARE  Avoid activities that cause pain to the injured area. Protect your injured area.  Slowly increase activity as told by your doctor.  Take medicine as told by your doctor.  Put ice on the injured area for the first 1-2 days after you have been treated or as told by your doctor.  Put ice in a plastic bag.  Place a towel between your skin and the bag.  Leave the ice on for 15-20 minutes at a time, every 2 hours while you are awake.  Do deep breathing as told by your doctor. You may be told to:  Take deep breaths many times a day.  Cough many times a day while hugging a pillow.  Use a device (incentive spirometer) to perform deep breathing many times a day.  Drink enough fluids to keep your pee (urine) clear or pale yellow.   Do not wear a rib belt or binder. These do not allow you to breathe deeply. GET HELP RIGHT AWAY IF:   You have a fever.  You have trouble  breathing.   You cannot stop coughing.  You cough up thick or bloody spit (mucus).   You feel sick to your stomach (nauseous), throw up (vomit), or have belly (abdominal) pain.   Your pain gets worse and medicine does not help.  MAKE SURE YOU:   Understand these instructions.  Will watch your condition.  Will get help right away if you are not doing well or get worse.   This information is not intended to replace advice given to you by your health care provider. Make sure you discuss any questions you have with your health care provider.   Document Released: 08/06/2008 Document Revised: 02/22/2013 Document Reviewed: 12/30/2012 Elsevier Interactive Patient Education 2016 [employer].  Sinusitis, Adult Sinusitis is redness, soreness, and puffiness (inflammation) of the air pockets in the bones of your face (sinuses). The redness, soreness, and puffiness can cause air and mucus to get trapped in your sinuses. This can allow germs to grow and cause an infection.  HOME CARE   Drink enough fluids to keep your pee (urine) clear or pale yellow.  Use a humidifier in your home.  Run a hot shower to create steam in the bathroom. Sit in the bathroom with the door closed. Breathe in the steam 3-4 times a day.  Put a warm, moist washcloth on your face 3-4 times a day, or as told by your doctor.  Use salt water sprays (saline sprays) to wet the thick fluid in your nose. This can help the sinuses drain.  Only take medicine as told by your doctor. GET HELP RIGHT AWAY IF:   Your pain gets worse.  You have very bad headaches.  You are sick to your stomach (nauseous).  You throw up (vomit).  You are very sleepy (drowsy) all the time.  Your face is puffy (swollen).  Your vision changes.  You have a stiff neck.  You have trouble breathing. MAKE SURE YOU:   Understand these instructions.  Will watch your condition.  Will get help right away if you are not doing well or  get worse.   This information is not intended to replace advice given to you by your health care provider. Make sure  you discuss any questions you have with your health care provider.   Document Released: 04/15/2008 Document Revised: 11/18/2014 Document Reviewed: 06/02/2012 Elsevier Interactive Patient Education [employer].

## 2015-10-24 NOTE — ED Notes (Signed)
Patient fell today after getting up and becoming dizzy. Patient fell after passing out, and hurt her tailbone and forehead.

## 2015-11-07 ENCOUNTER — Other Ambulatory Visit: Payer: Self-pay | Admitting: Internal Medicine

## 2015-11-08 NOTE — Telephone Encounter (Signed)
Ok to refill,  printed rx  

## 2015-11-08 NOTE — Telephone Encounter (Signed)
Please advise refill.  Thanks

## 2015-11-09 ENCOUNTER — Encounter (INDEPENDENT_AMBULATORY_CARE_PROVIDER_SITE_OTHER): Payer: Self-pay

## 2015-11-09 ENCOUNTER — Encounter: Payer: Self-pay | Admitting: Internal Medicine

## 2016-01-25 ENCOUNTER — Other Ambulatory Visit: Payer: Self-pay | Admitting: Internal Medicine

## 2016-03-22 ENCOUNTER — Telehealth: Payer: Self-pay | Admitting: Internal Medicine

## 2016-03-22 ENCOUNTER — Other Ambulatory Visit (INDEPENDENT_AMBULATORY_CARE_PROVIDER_SITE_OTHER): Payer: Medicare Other

## 2016-03-22 DIAGNOSIS — R109 Unspecified abdominal pain: Secondary | ICD-10-CM | POA: Diagnosis not present

## 2016-03-22 LAB — URINALYSIS, ROUTINE W REFLEX MICROSCOPIC
Bilirubin Urine: NEGATIVE
Hgb urine dipstick: NEGATIVE
Ketones, ur: NEGATIVE
Leukocytes, UA: NEGATIVE
Nitrite: NEGATIVE
PH: 5.5 (ref 5.0–8.0)
RBC / HPF: NONE SEEN (ref 0–?)
TOTAL PROTEIN, URINE-UPE24: NEGATIVE
URINE GLUCOSE: NEGATIVE
Urobilinogen, UA: 0.2 (ref 0.0–1.0)
WBC, UA: NONE SEEN (ref 0–?)

## 2016-03-22 LAB — POCT URINALYSIS DIPSTICK
BILIRUBIN UA: NEGATIVE
Blood, UA: NEGATIVE
GLUCOSE UA: NEGATIVE
KETONES UA: NEGATIVE
LEUKOCYTES UA: NEGATIVE
NITRITE UA: NEGATIVE
Protein, UA: NEGATIVE
Spec Grav, UA: <=1.005
Urobilinogen, UA: 0.2
pH, UA: 5.5

## 2016-03-22 NOTE — Telephone Encounter (Signed)
Santiago Glad please advise patient. thanks

## 2016-03-22 NOTE — Telephone Encounter (Signed)
Ok to drop off. Yes i have ordered the labs

## 2016-03-22 NOTE — Telephone Encounter (Signed)
Pt would like to have a urine test done. She thinks she has a kidney or bladder infection. Pt has an appt on June 5th and doesn't want to come in before. Please call pt at home phone.

## 2016-03-22 NOTE — Telephone Encounter (Signed)
I have no appts open today, please advise if patient can drop off urine or needs to go to urgent care? thanks

## 2016-03-23 ENCOUNTER — Encounter: Payer: Self-pay | Admitting: Internal Medicine

## 2016-03-24 LAB — URINE CULTURE
Colony Count: NO GROWTH
ORGANISM ID, BACTERIA: NO GROWTH

## 2016-03-27 ENCOUNTER — Other Ambulatory Visit: Payer: Self-pay

## 2016-03-27 DIAGNOSIS — Z8781 Personal history of (healed) traumatic fracture: Secondary | ICD-10-CM

## 2016-03-28 ENCOUNTER — Other Ambulatory Visit: Payer: Self-pay | Admitting: Internal Medicine

## 2016-03-28 DIAGNOSIS — S2232XS Fracture of one rib, left side, sequela: Secondary | ICD-10-CM

## 2016-04-02 DIAGNOSIS — H524 Presbyopia: Secondary | ICD-10-CM | POA: Diagnosis not present

## 2016-04-10 ENCOUNTER — Ambulatory Visit: Payer: Medicare Other

## 2016-04-15 ENCOUNTER — Ambulatory Visit (INDEPENDENT_AMBULATORY_CARE_PROVIDER_SITE_OTHER): Payer: Medicare Other | Admitting: Internal Medicine

## 2016-04-15 ENCOUNTER — Encounter: Payer: Self-pay | Admitting: Internal Medicine

## 2016-04-15 VITALS — BP 118/60 | HR 94 | Temp 97.9°F | Resp 12 | Ht 61.0 in | Wt 104.0 lb

## 2016-04-15 DIAGNOSIS — Z1239 Encounter for other screening for malignant neoplasm of breast: Secondary | ICD-10-CM | POA: Diagnosis not present

## 2016-04-15 DIAGNOSIS — Z0000 Encounter for general adult medical examination without abnormal findings: Secondary | ICD-10-CM | POA: Diagnosis not present

## 2016-04-15 DIAGNOSIS — E785 Hyperlipidemia, unspecified: Secondary | ICD-10-CM | POA: Diagnosis not present

## 2016-04-15 DIAGNOSIS — E559 Vitamin D deficiency, unspecified: Secondary | ICD-10-CM | POA: Diagnosis not present

## 2016-04-15 DIAGNOSIS — E538 Deficiency of other specified B group vitamins: Secondary | ICD-10-CM

## 2016-04-15 DIAGNOSIS — R5383 Other fatigue: Secondary | ICD-10-CM

## 2016-04-15 LAB — LIPID PANEL
CHOL/HDL RATIO: 3
CHOLESTEROL: 193 mg/dL (ref 0–200)
HDL: 61.3 mg/dL (ref >39.00)
LDL CALC: 102 mg/dL — AB (ref 0–99)
NonHDL: 131.87
TRIGLYCERIDES: 147 mg/dL (ref 0.0–149.0)
VLDL: 29.4 mg/dL (ref 0.0–40.0)

## 2016-04-15 LAB — TSH: TSH: 1.59 u[IU]/mL (ref 0.35–4.50)

## 2016-04-15 LAB — CBC WITH DIFFERENTIAL/PLATELET
BASOS PCT: 0.7 % (ref 0.0–3.0)
Basophils Absolute: 0.1 10*3/uL (ref 0.0–0.1)
EOS PCT: 2.2 % (ref 0.0–5.0)
Eosinophils Absolute: 0.2 10*3/uL (ref 0.0–0.7)
HEMATOCRIT: 41.4 % (ref 36.0–46.0)
HEMOGLOBIN: 13.7 g/dL (ref 12.0–15.0)
LYMPHS PCT: 47.6 % — AB (ref 12.0–46.0)
Lymphs Abs: 4.2 10*3/uL — ABNORMAL HIGH (ref 0.7–4.0)
MCHC: 33.2 g/dL (ref 30.0–36.0)
MCV: 102.5 fl — ABNORMAL HIGH (ref 78.0–100.0)
Monocytes Absolute: 0.8 10*3/uL (ref 0.1–1.0)
Monocytes Relative: 9.1 % (ref 3.0–12.0)
Neutro Abs: 3.5 10*3/uL (ref 1.4–7.7)
Neutrophils Relative %: 40.4 % — ABNORMAL LOW (ref 43.0–77.0)
Platelets: 277 10*3/uL (ref 150.0–400.0)
RBC: 4.04 Mil/uL (ref 3.87–5.11)
RDW: 13.8 % (ref 11.5–15.5)
WBC: 8.8 10*3/uL (ref 4.0–10.5)

## 2016-04-15 LAB — COMPREHENSIVE METABOLIC PANEL
ALT: 16 U/L (ref 0–35)
AST: 19 U/L (ref 0–37)
Albumin: 4.5 g/dL (ref 3.5–5.2)
Alkaline Phosphatase: 54 U/L (ref 39–117)
BUN: 11 mg/dL (ref 6–23)
CHLORIDE: 108 meq/L (ref 96–112)
CO2: 28 meq/L (ref 19–32)
Calcium: 9.6 mg/dL (ref 8.4–10.5)
Creatinine, Ser: 0.67 mg/dL (ref 0.40–1.20)
GFR: 92.91 mL/min (ref >60.00)
GLUCOSE: 104 mg/dL — AB (ref 70–99)
POTASSIUM: 4.4 meq/L (ref 3.5–5.1)
SODIUM: 141 meq/L (ref 135–145)
TOTAL PROTEIN: 6.2 g/dL (ref 6.0–8.3)
Total Bilirubin: 0.2 mg/dL (ref 0.2–1.2)

## 2016-04-15 LAB — HEPATITIS C ANTIBODY: HCV Ab: NEGATIVE

## 2016-04-15 LAB — VITAMIN B12: VITAMIN B 12: 290 pg/mL (ref 211–911)

## 2016-04-15 LAB — VITAMIN D 25 HYDROXY (VIT D DEFICIENCY, FRACTURES): VITD: 21.25 ng/mL — ABNORMAL LOW (ref 30.00–100.00)

## 2016-04-15 NOTE — Progress Notes (Signed)
Pre-visit discussion using our clinic review tool. No additional management support is needed unless otherwise documented below in the visit note.  

## 2016-04-15 NOTE — Patient Instructions (Addendum)
Mammogram ordered  DEXA scan still needed in order to treat osteoporosis.    Fasting labs, Vitamin D  and b12 level today   Menopause is a normal process in which your reproductive ability comes to an end. This process happens gradually over a span of months to years, usually between the ages of 69 and 66. Menopause is complete when you have missed 12 consecutive menstrual periods. It is important to talk with your health care provider about some of the most common conditions that affect postmenopausal women, such as heart disease, cancer, and bone loss (osteoporosis). Adopting a healthy lifestyle and getting preventive care can help to promote your health and wellness. Those actions can also lower your chances of developing some of these common conditions. WHAT SHOULD I KNOW ABOUT MENOPAUSE? During menopause, you may experience a number of symptoms, such as:  Moderate-to-severe hot flashes.  Night sweats.  Decrease in sex drive.  Mood swings.  Headaches.  Tiredness.  Irritability.  Memory problems.  Insomnia. Choosing to treat or not to treat menopausal changes is an individual decision that you make with your health care provider. WHAT SHOULD I KNOW ABOUT HORMONE REPLACEMENT THERAPY AND SUPPLEMENTS? Hormone therapy products are effective for treating symptoms that are associated with menopause, such as hot flashes and night sweats. Hormone replacement carries certain risks, especially as you become older. If you are thinking about using estrogen or estrogen with progestin treatments, discuss the benefits and risks with your health care provider. WHAT SHOULD I KNOW ABOUT HEART DISEASE AND STROKE? Heart disease, heart attack, and stroke become more likely as you age. This may be due, in part, to the hormonal changes that your body experiences during menopause. These can affect how your body processes dietary fats, triglycerides, and cholesterol. Heart attack and stroke are both  medical emergencies. There are many things that you can do to help prevent heart disease and stroke:  Have your blood pressure checked at least every 1-2 years. High blood pressure causes heart disease and increases the risk of stroke.  If you are 69-69 years old, ask your health care provider if you should take aspirin to prevent a heart attack or a stroke.  Do not use any tobacco products, including cigarettes, chewing tobacco, or electronic cigarettes. If you need help quitting, ask your health care provider.  It is important to eat a healthy diet and maintain a healthy weight.  Be sure to include plenty of vegetables, fruits, low-fat dairy products, and lean protein.  Avoid eating foods that are high in solid fats, added sugars, or salt (sodium).  Get regular exercise. This is one of the most important things that you can do for your health.  Try to exercise for at least 150 minutes each week. The type of exercise that you do should increase your heart rate and make you sweat. This is known as moderate-intensity exercise.  Try to do strengthening exercises at least twice each week. Do these in addition to the moderate-intensity exercise.  Know your numbers.Ask your health care provider to check your cholesterol and your blood glucose. Continue to have your blood tested as directed by your health care provider. WHAT SHOULD I KNOW ABOUT CANCER SCREENING? There are several types of cancer. Take the following steps to reduce your risk and to catch any cancer development as early as possible. Breast Cancer  Practice breast self-awareness.  This means understanding how your breasts normally appear and feel.  It also means doing regular breast  self-exams. Let your health care provider know about any changes, no matter how small.  If you are 69 or older, have a clinician do a breast exam (clinical breast exam or CBE) every year. Depending on your age, family history, and medical history,  it may be recommended that you also have a yearly breast X-ray (mammogram).  If you have a family history of breast cancer, talk with your health care provider about genetic screening.  If you are at high risk for breast cancer, talk with your health care provider about having an MRI and a mammogram every year.  Breast cancer (BRCA) gene test is recommended for women who have family members with BRCA-related cancers. Results of the assessment will determine the need for genetic counseling and BRCA1 and for BRCA2 testing. BRCA-related cancers include these types:  Breast. This occurs in males or females.  Ovarian.  Tubal. This may also be called fallopian tube cancer.  Cancer of the abdominal or pelvic lining (peritoneal cancer).  Prostate.  Pancreatic. Cervical, Uterine, and Ovarian Cancer Your health care provider may recommend that you be screened regularly for cancer of the pelvic organs. These include your ovaries, uterus, and vagina. This screening involves a pelvic exam, which includes checking for microscopic changes to the surface of your cervix (Pap test).  For women ages 21-65, health care providers may recommend a pelvic exam and a Pap test every three years. For women ages 69-69, they may recommend the Pap test and pelvic exam, combined with testing for human papilloma virus (HPV), every five years. Some types of HPV increase your risk of cervical cancer. Testing for HPV may also be done on women of any age who have unclear Pap test results.  Other health care providers may not recommend any screening for nonpregnant women who are considered low risk for pelvic cancer and have no symptoms. Ask your health care provider if a screening pelvic exam is right for you.  If you have had past treatment for cervical cancer or a condition that could lead to cancer, you need Pap tests and screening for cancer for at least 20 years after your treatment. If Pap tests have been discontinued  for you, your risk factors (such as having a new sexual partner) need to be reassessed to determine if you should start having screenings again. Some women have medical problems that increase the chance of getting cervical cancer. In these cases, your health care provider may recommend that you have screening and Pap tests more often.  If you have a family history of uterine cancer or ovarian cancer, talk with your health care provider about genetic screening.  If you have vaginal bleeding after reaching menopause, tell your health care provider.  There are currently no reliable tests available to screen for ovarian cancer. Lung Cancer Lung cancer screening is recommended for adults 83-51 years old who are at high risk for lung cancer because of a history of smoking. A yearly low-dose CT scan of the lungs is recommended if you:  Currently smoke.  Have a history of at least 30 pack-years of smoking and you currently smoke or have quit within the past 15 years. A pack-year is smoking an average of one pack of cigarettes per day for one year. Yearly screening should:  Continue until it has been 15 years since you quit.  Stop if you develop a health problem that would prevent you from having lung cancer treatment. Colorectal Cancer  This type of cancer can  be detected and can often be prevented.  Routine colorectal cancer screening usually begins at age 35 and continues through age 16.  If you have risk factors for colon cancer, your health care provider may recommend that you be screened at an earlier age.  If you have a family history of colorectal cancer, talk with your health care provider about genetic screening.  Your health care provider may also recommend using home test kits to check for hidden blood in your stool.  A small camera at the end of a tube can be used to examine your colon directly (sigmoidoscopy or colonoscopy). This is done to check for the earliest forms of  colorectal cancer.  Direct examination of the colon should be repeated every 5-10 years until age 67. However, if early forms of precancerous polyps or small growths are found or if you have a family history or genetic risk for colorectal cancer, you may need to be screened more often. Skin Cancer  Check your skin from head to toe regularly.  Monitor any moles. Be sure to tell your health care provider:  About any new moles or changes in moles, especially if there is a change in a mole's shape or color.  If you have a mole that is larger than the size of a pencil eraser.  If any of your family members has a history of skin cancer, especially at a young age, talk with your health care provider about genetic screening.  Always use sunscreen. Apply sunscreen liberally and repeatedly throughout the day.  Whenever you are outside, protect yourself by wearing long sleeves, pants, a wide-brimmed hat, and sunglasses. WHAT SHOULD I KNOW ABOUT OSTEOPOROSIS? Osteoporosis is a condition in which bone destruction happens more quickly than new bone creation. After menopause, you may be at an increased risk for osteoporosis. To help prevent osteoporosis or the bone fractures that can happen because of osteoporosis, the following is recommended:  If you are 85-74 years old, get at least 1,000 mg of calcium and at least 600 mg of vitamin D per day.  If you are older than age 45 but younger than age 64, get at least 1,200 mg of calcium and at least 600 mg of vitamin D per day.  If you are older than age 97, get at least 1,200 mg of calcium and at least 800 mg of vitamin D per day. Smoking and excessive alcohol intake increase the risk of osteoporosis. Eat foods that are rich in calcium and vitamin D, and do weight-bearing exercises several times each week as directed by your health care provider. WHAT SHOULD I KNOW ABOUT HOW MENOPAUSE AFFECTS [place]? Depression may occur at any age, but it is  more common as you become older. Common symptoms of depression include:  Low or sad mood.  Changes in sleep patterns.  Changes in appetite or eating patterns.  Feeling an overall lack of motivation or enjoyment of activities that you previously enjoyed.  Frequent crying spells. Talk with your health care provider if you think that you are experiencing depression. WHAT SHOULD I KNOW ABOUT IMMUNIZATIONS? It is important that you get and maintain your immunizations. These include:  Tetanus, diphtheria, and pertussis (Tdap) booster vaccine.  Influenza every year before the flu season begins.  Pneumonia vaccine.  Shingles vaccine. Your health care provider may also recommend other immunizations.   This information is not intended to replace advice given to you by your health care provider. Make sure you discuss  any questions you have with your health care provider.   Document Released: 12/20/2005 Document Revised: 11/18/2014 Document Reviewed: 06/30/2014 Elsevier Interactive Patient Education [employer].   [phone]

## 2016-04-15 NOTE — Progress Notes (Signed)
Patient ID: Tamara [name], female    DOB: Jul 18, 1946  Age: 70 y.o. MRN: [id number]  The patient is here for annual Medicare wellness examination and management of other chronic and acute problems.   colonoscopy 2015  Mammogram overdue  2014 last one Hysterectomy remotely, total  DDD lower spine ,  Sacrum and coccyx   DEXA continually deferred,  osteoporosis  Received  reclast for 3 years, quit going in 2013  Last meal this am    The risk factors are reflected in the social history.  The roster of all physicians providing medical care to patient - is listed in the Snapshot section of the chart.  Activities of daily living:  The patient is 100% independent in all ADLs: dressing, toileting, feeding as well as independent mobility  Home safety : The patient has smoke detectors in the home. They wear seatbelts.  There are no firearms at home. There is no violence in the home.   There is no risks for hepatitis, STDs or HIV. There is no   history of blood transfusion. They have no travel history to infectious disease endemic areas of the world.  The patient has seen their dentist in the last six month. They have seen their eye doctor in the last year. They admit to slight hearing difficulty with regard to whispered voices and some television programs.  They have deferred audiologic testing in the last year.  They do not  have excessive sun exposure. Discussed the need for sun protection: hats, long sleeves and use of sunscreen if there is significant sun exposure.   Diet: the importance of a healthy diet is discussed. They do have a healthy diet.  The benefits of regular aerobic exercise were discussed. She walks 4 times per week ,  20 minutes.   Depression screen: there are no signs or vegative symptoms of depression- irritability, change in appetite, anhedonia, sadness/tearfullness.  Cognitive assessment: the patient manages all their financial and personal affairs and is actively engaged.  They could relate day,date,year and events; recalled 2/3 objects at 3 minutes; performed clock-face test normally.  The following portions of the patient's history were reviewed and updated as appropriate: allergies, current medications, past family history, past medical history,  past surgical history, past social history  and problem list.  Visual acuity was not assessed per patient preference since she has regular follow up with her ophthalmologist. Hearing and body mass index were assessed and reviewed.   During the course of the visit the patient was educated and counseled about appropriate screening and preventive services including : fall prevention , diabetes screening, nutrition counseling, colorectal cancer screening, and recommended immunizations.    CC: There were no encounter diagnoses.   Husband recently passed away.  Has resumed smoking.   Back pain , non radiating,  Doing yard work   Trigger finger release left hand  Tamara [name],  Nov 2016  Recurrent headache with transient loss of vision ,  No vertebral artery stenosis  .  3-4 month  Saw [facility] July , increased gabapentin to 200 mg bid.   Smoking again. One pack daily.  Does not want medication.    History Tamara [name] has a past medical history of Osteoporosis; Chronic abdominal pain; Crohn's disease ([place]); History of DVT (deep vein thrombosis) (01/2007); and Bowel obstruction ([place]).   She has past surgical history that includes Cholecystectomy; Cervical fusion (2005); Splenectomy; Rotator cuff repair; Appendectomy; gyn surgery; laparotomy (01/30/07); Cataract extraction; Nissen fundoplication; and Abdominal hysterectomy.  Her family history includes Coronary artery disease in her father; Diabetes in her mother; Heart disease in her mother; Hypertension in her mother.She reports that she quit smoking about 16 months ago. She has never used smokeless tobacco. She reports that she does not drink alcohol or use illicit  drugs.  Outpatient Prescriptions Prior to Visit  Medication Sig Dispense Refill  . acyclovir (ZOVIRAX) 400 MG tablet Take 1 tablet (400 mg total) by mouth 3 (three) times daily. 21 tablet 0  . clonazePAM (KLONOPIN) 0.5 MG tablet TAKE 1 TABLET BY MOUTH ONCE DAILY AS NEEDED FOR ANXIETY. 30 tablet 5  . gabapentin (NEURONTIN) 300 MG capsule Take 300 mg by mouth daily.    . nortriptyline (PAMELOR) 25 MG capsule TAKE 1 CAPSULE BY MOUTH EACH NIGHT AT BEDTIME. 90 capsule 2  . sertraline (ZOLOFT) 100 MG tablet Take 1 tablet (100 mg total) by mouth daily. 30 tablet 6  . traZODone (DESYREL) 50 MG tablet TAKE 1/2 OR 1 TABLET BY MOUTH AT BEDTIMEAS NEEDED FOR SLEEP. 90 tablet 1  . cyanocobalamin (,VITAMIN B-12,) 1000 MCG/ML injection INJECT 1 VIAL (58m) INTRAMUSCULARLY ONCEPER WEEK. (Patient not taking: Reported on 04/15/2016) 10 mL 6  . ergocalciferol (DRISDOL) 50000 UNITS capsule Take 1 capsule (50,000 Units total) by mouth once a week. (Patient not taking: Reported on 04/15/2016) 12 capsule 0  . fexofenadine-pseudoephedrine (ALLEGRA-D ALLERGY & CONGESTION) 180-240 MG 24 hr tablet Take 1 tablet by mouth daily. (Patient not taking: Reported on 04/15/2016) 30 tablet 0  . gabapentin (NEURONTIN) 100 MG capsule Take 300 mg by mouth 2 (two) times daily. Reported on 04/15/2016    . HYDROcodone-acetaminophen (NORCO) 10-325 MG per tablet Take 1 tablet by mouth every 6 (six) hours as needed. (Patient not taking: Reported on 04/15/2016) 15 tablet 0  . HYDROcodone-acetaminophen (NORCO) 5-325 MG tablet Take 1 tablet by mouth every 8 (eight) hours as needed for moderate pain. (Patient not taking: Reported on 04/15/2016) 20 tablet 0  . meloxicam (MOBIC) 15 MG tablet Take 1 tablet (15 mg total) by mouth daily. (Patient not taking: Reported on 04/15/2016) 30 tablet 0  . meloxicam (MOBIC) 7.5 MG tablet Take 1 tablet (7.5 mg total) by mouth daily. (Patient not taking: Reported on 04/15/2016) 30 tablet 0  . Syringe, Disposable, 3 ML MISC For use  with B12 injections weekly/monthly (Patient not taking: Reported on 04/15/2016) 25 each 0  . tiZANidine (ZANAFLEX) 4 MG tablet Take 1 tablet (4 mg total) by mouth every 8 (eight) hours as needed for muscle spasms. Recommend if sedation occurs just take at night (Patient not taking: Reported on 04/15/2016) 30 tablet 0  . amoxicillin-clavulanate (AUGMENTIN) 875-125 MG tablet Take 1 tablet by mouth 2 (two) times daily. 20 tablet 0   No facility-administered medications prior to visit.    Review of Systems   Patient denies  fevers, malaise, unintentional weight loss, skin rash, eye pain, sinus congestion and sinus pain, sore throat, dysphagia,  hemoptysis , cough, dyspnea, wheezing, chest pain, palpitations, orthopnea, edema, abdominal pain, nausea, melena, diarrhea, constipation, flank pain, dysuria, hematuria, urinary  Frequency, nocturia, numbness, tingling, seizures,  Focal weakness, Loss of consciousness,  Tremor, insomnia, depression, anxiety, and suicidal ideation.      Objective:  BP 118/60 mmHg  Pulse 94  Temp(Src) 97.9 F (36.6 C) (Oral)  Resp 12  Ht 5' 1"  (1.549 m)  Wt 104 lb (47.174 kg)  BMI 19.66 kg/m2  SpO2 97%  Physical Exam   General appearance: alert, cooperative and  appears stated age Head: Normocephalic, without obvious abnormality, atraumatic Eyes: conjunctivae/corneas clear. PERRL, EOM's intact. Fundi benign. Ears: normal TM's and external ear canals both ears Nose: Nares normal. Septum midline. Mucosa normal. No drainage or sinus tenderness. Throat: lips, mucosa, and tongue normal; teeth and gums normal Neck: no adenopathy, no carotid bruit, no JVD, supple, symmetrical, trachea midline and thyroid not enlarged, symmetric, no tenderness/mass/nodules Lungs: clear to auscultation bilaterally Breasts: normal appearance, no masses or tenderness Heart: regular rate and rhythm, S1, S2 normal, no murmur, click, rub or gallop Abdomen: soft, non-tender; bowel sounds normal; no  masses,  no organomegaly Extremities: extremities normal, atraumatic, no cyanosis or edema Pulses: 2+ and symmetric Skin: Skin color, texture, turgor normal. No rashes or lesions Neurologic: Alert and oriented X 3, normal strength and tone. Normal symmetric reflexes. Normal coordination and gait.     Assessment & Plan:   Problem List Items Addressed This Visit    None      I have discontinued Ms. Newlon's amoxicillin-clavulanate. I am also having her maintain her sertraline, gabapentin, HYDROcodone-acetaminophen, acyclovir, Syringe (Disposable), meloxicam, ergocalciferol, cyanocobalamin, gabapentin, HYDROcodone-acetaminophen, fexofenadine-pseudoephedrine, meloxicam, tiZANidine, clonazePAM, nortriptyline, and traZODone.  No orders of the defined types were placed in this encounter.    Medications Discontinued During This Encounter  Medication Reason  . amoxicillin-clavulanate (AUGMENTIN) 875-125 MG tablet Completed Course    Follow-up: No Follow-up on file.   Crecencio Mc, MD

## 2016-04-16 DIAGNOSIS — Z0000 Encounter for general adult medical examination without abnormal findings: Secondary | ICD-10-CM | POA: Insufficient documentation

## 2016-04-16 NOTE — Assessment & Plan Note (Signed)
Annual Medicare wellness  exam was done . During the course of the visit the patient was educated and counseled about appropriate screening and preventive services including : fall prevention , diabetes screening, nutrition counseling, colorectal cancer screening, and recommended immunizations.  Printed recommendations for health maintenance screenings was given.

## 2016-04-16 NOTE — Assessment & Plan Note (Signed)
Annual comprehensive preventive exam was done as well as an evaluation and management of chronic conditions .  During the course of the visit the patient was educated and counseled about appropriate screening and preventive services including :  diabetes screening, lipid analysis with projected  10 year  risk for CAD , nutrition counseling, breast, cervical and colorectal cancer screening, and recommended immunizations.  Printed recommendations for health maintenance screenings was given 

## 2016-04-17 ENCOUNTER — Other Ambulatory Visit: Payer: Self-pay | Admitting: Internal Medicine

## 2016-04-17 ENCOUNTER — Encounter: Payer: Self-pay | Admitting: Internal Medicine

## 2016-04-17 IMAGING — CR DG CHEST 2V
1 series · 2 of 2 positions shown · non-contrast
Comparison: 01/26/2014

CLINICAL DATA: Woke up with chest pain and LEFT elbow pain today,
unable to move LEFT arm, smoker

EXAM:
CHEST  2 VIEW

[Series 1: dxr chest pa (or ap) and lateral · 0.14mm/px · 2 of 2 slices shown]
[im 1/2]
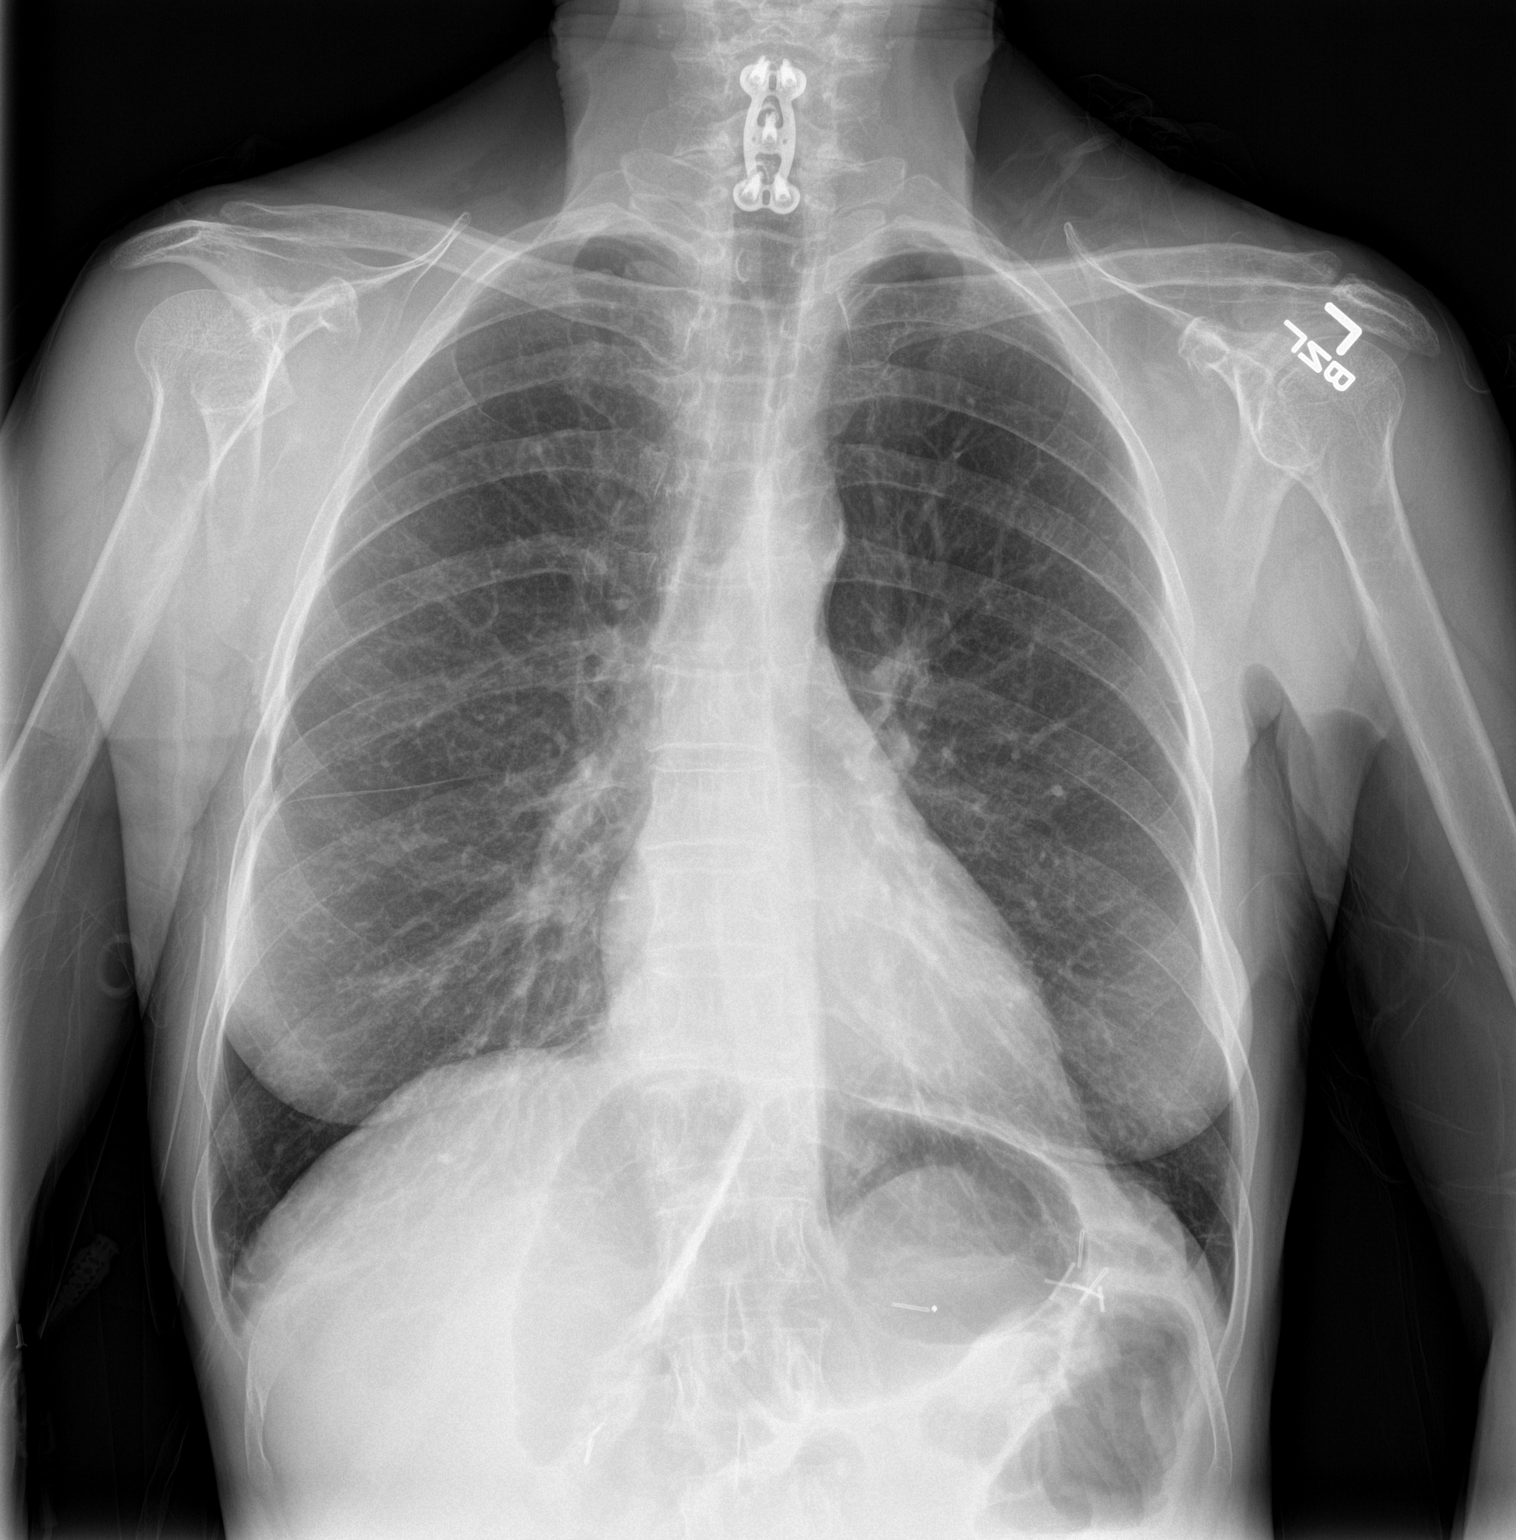
[im 2/2]
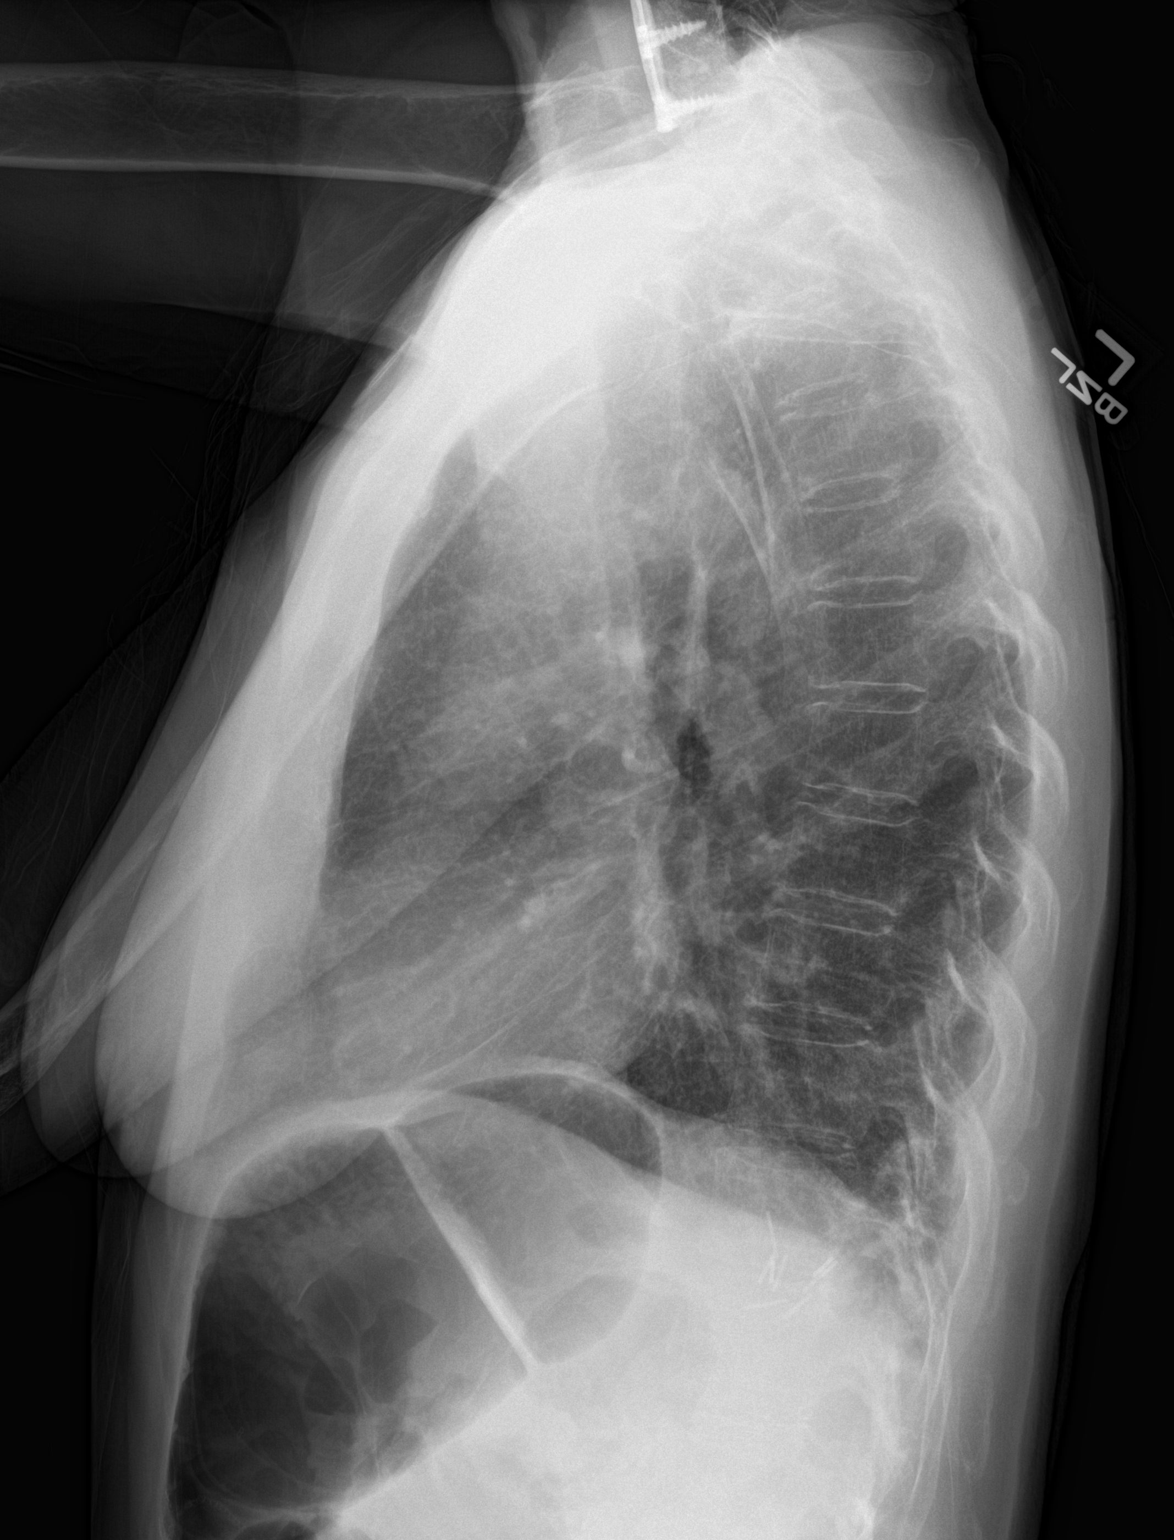

[2 of 2 positions shown; findings below may reference images not displayed]

FINDINGS: Normal heart size, mediastinal contours, and pulmonary vascularity.

Lungs emphysematous but clear.

No pulmonary infiltrate, pleural effusion or pneumothorax.

Diffuse osseous demineralization.

Prior cervical spine fusion.

LEFT upper quadrant surgical clips question prior gastric surgery.
IMPRESSION: Emphysematous changes.

No acute abnormalities.

## 2016-04-17 IMAGING — CR DG ELBOW 2V*L*
1 series · 2 of 2 positions shown · non-contrast
Comparison: None.

CLINICAL DATA: Pain.

EXAM:
LEFT ELBOW - 2 VIEW

[Series 1: dxr elbow left ap and lateral · 0.14mm/px · 2 of 2 slices shown]
[im 1/2]
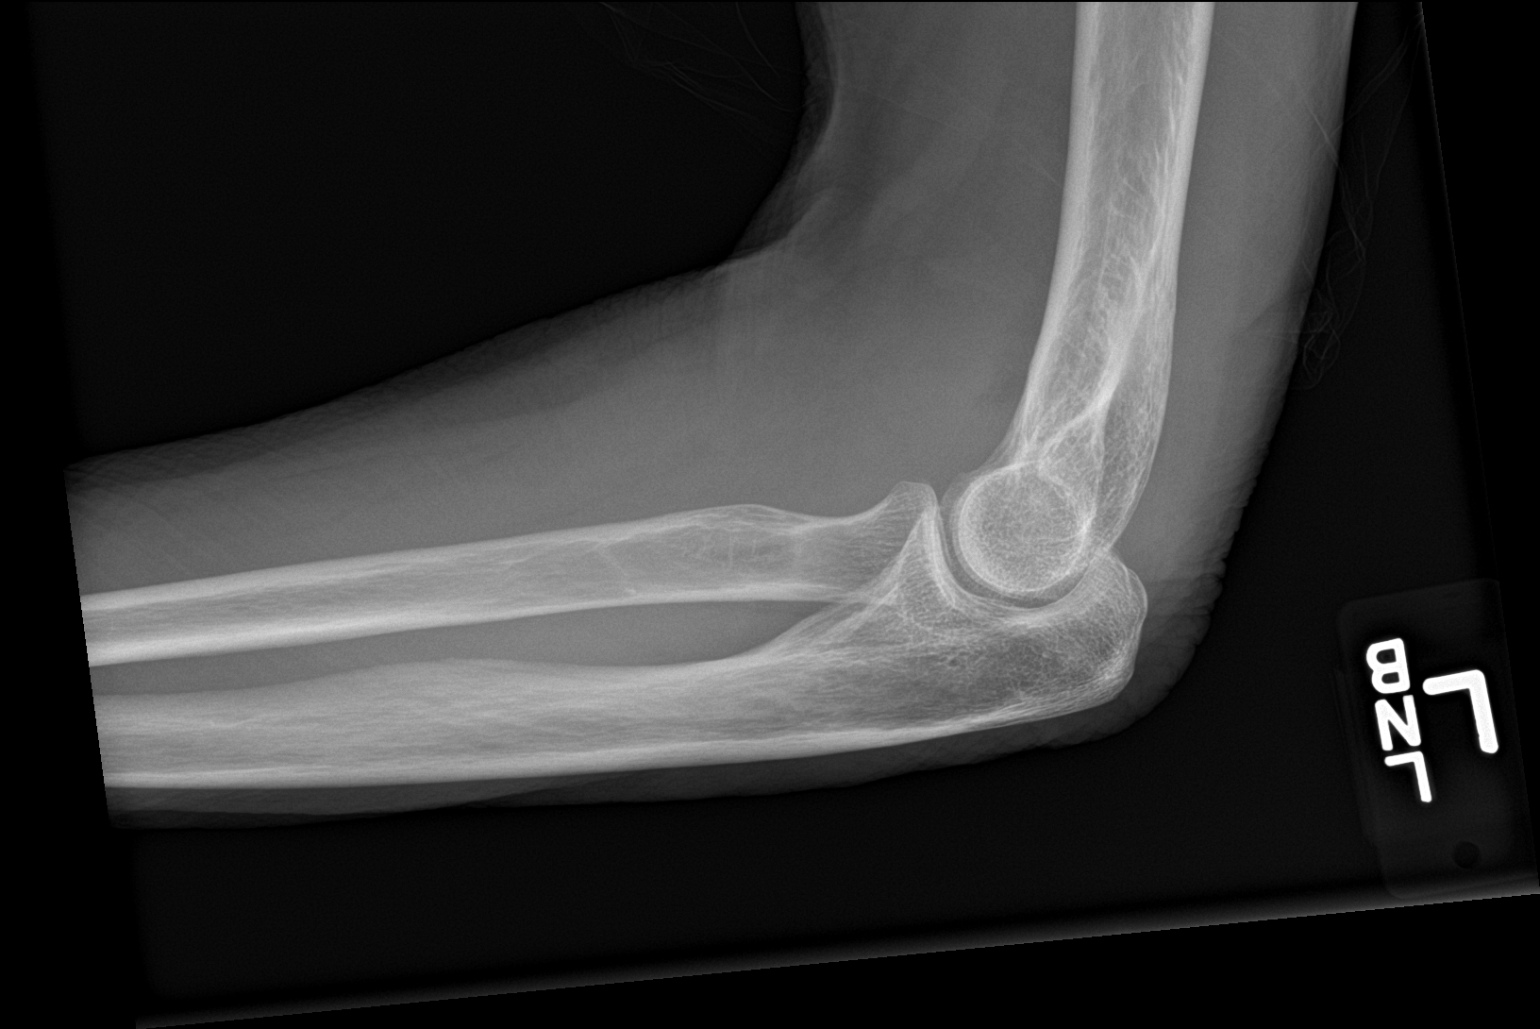
[im 2/2]
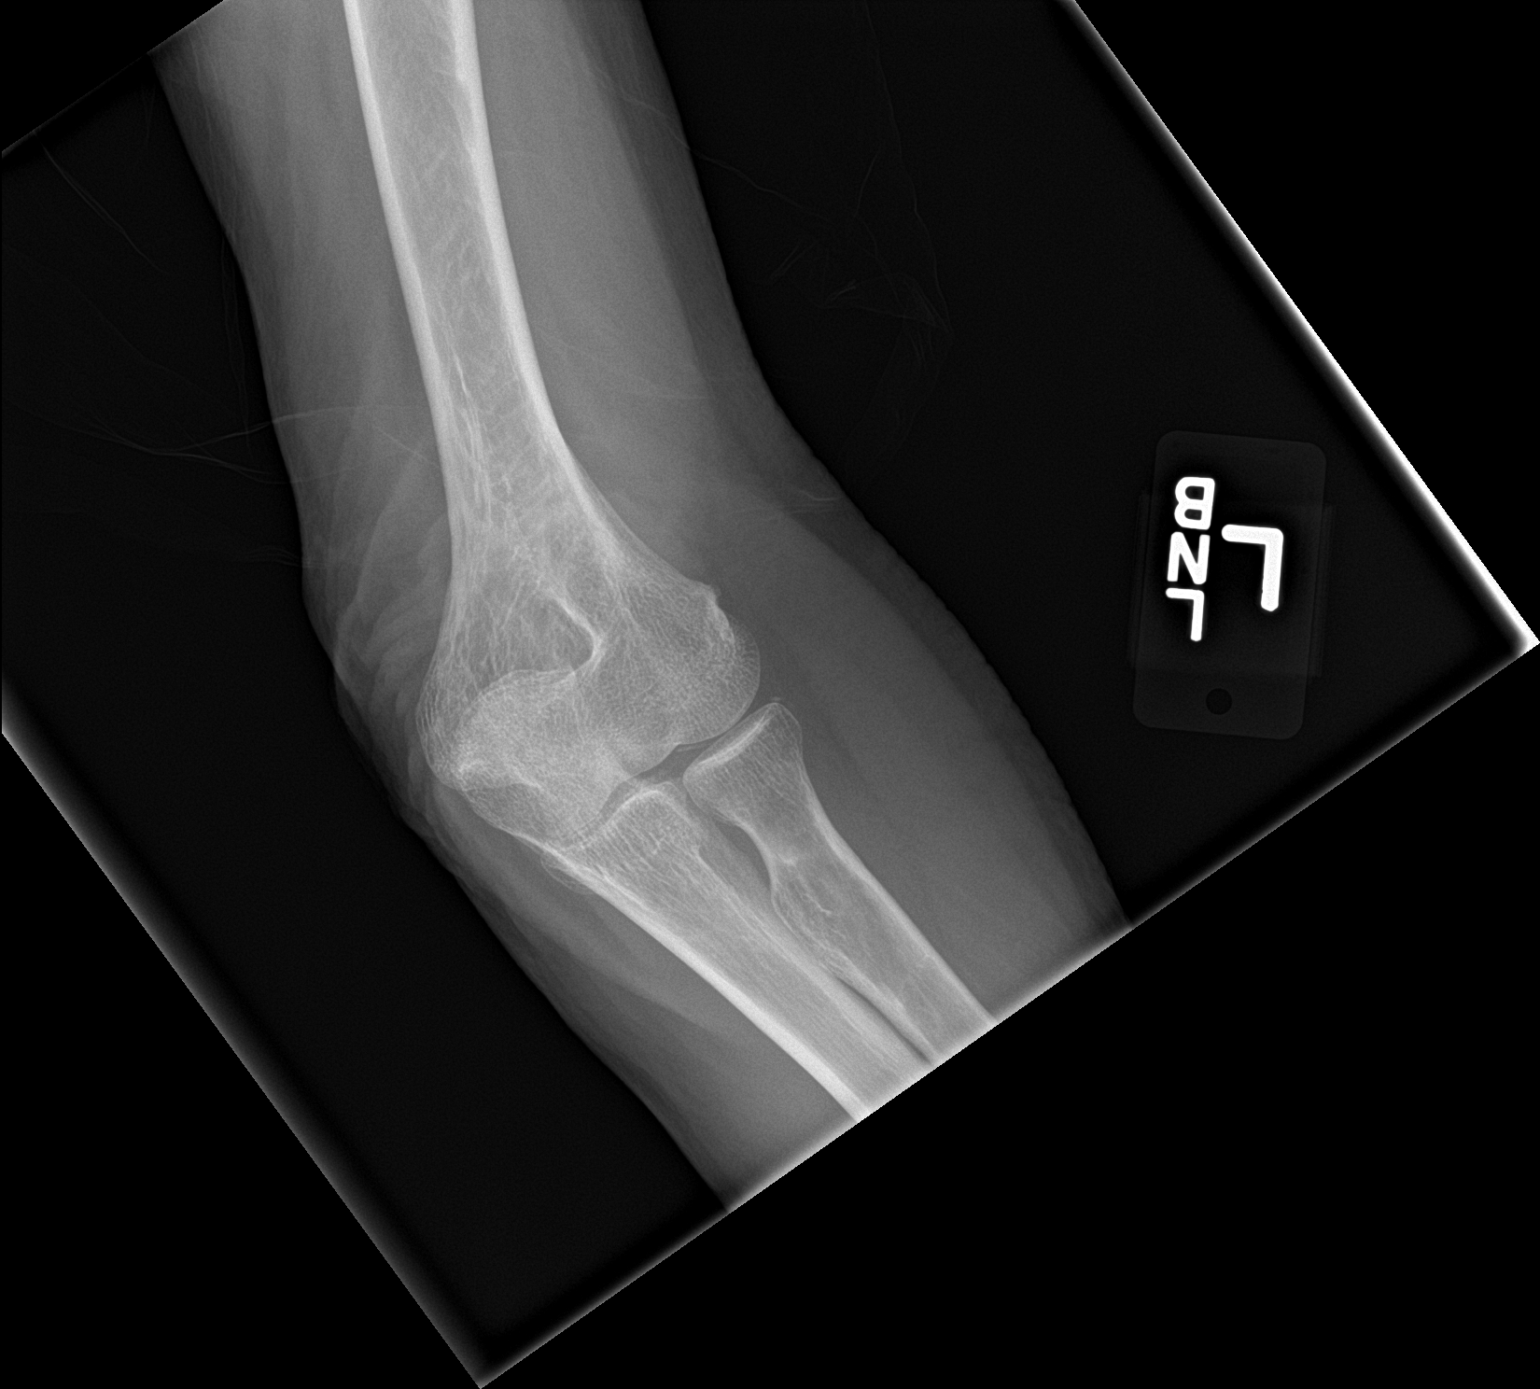

[2 of 2 positions shown; findings below may reference images not displayed]

FINDINGS: Calcium deposition is noted in the elbow joint, suggesting
chondrocalcinosis. Elbow is located. There is no evidence of
fracture. Small joint effusion is suspected on the lateral view.
IMPRESSION: Chondrocalcinosis with probable small joint effusion. Findings raise
a possibility of pseudogout (CPPD arthritis).

No evidence of fracture.

## 2016-04-17 IMAGING — US US EXTREM UP VENOUS*L*
1 series · 13 of 24 positions shown · non-contrast
Comparison: None.

CLINICAL DATA: Pain x2 days, history of DVT, IVC filter placement,
previous tobacco abuse.



[Series 1: us extrem up venous*left* · 0.05mm/px · 13 of 49 slices shown]
[im 1/49]
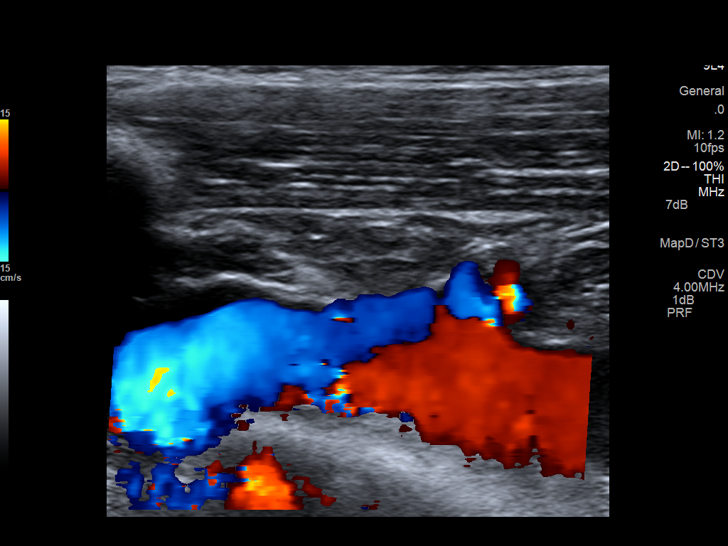
[im 5/49]
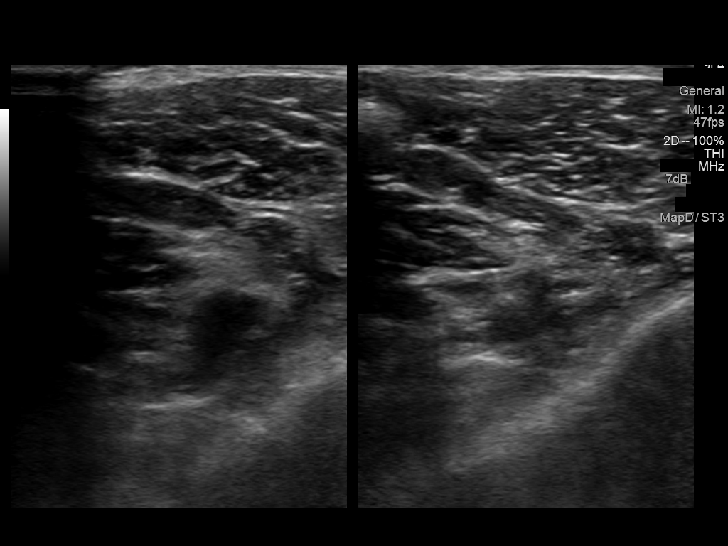
[im 9/49]
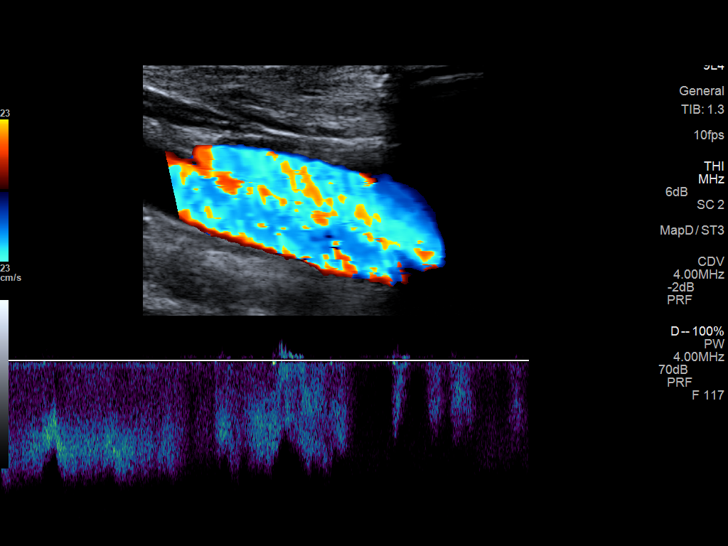
[im 13/49]
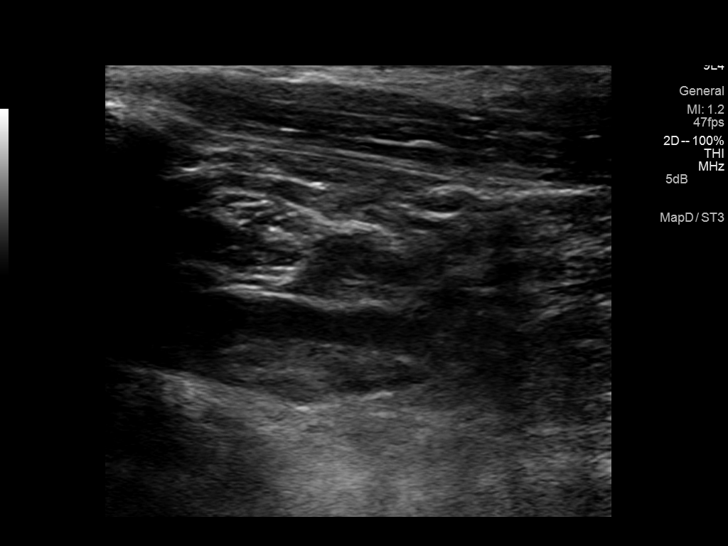
[im 17/49]
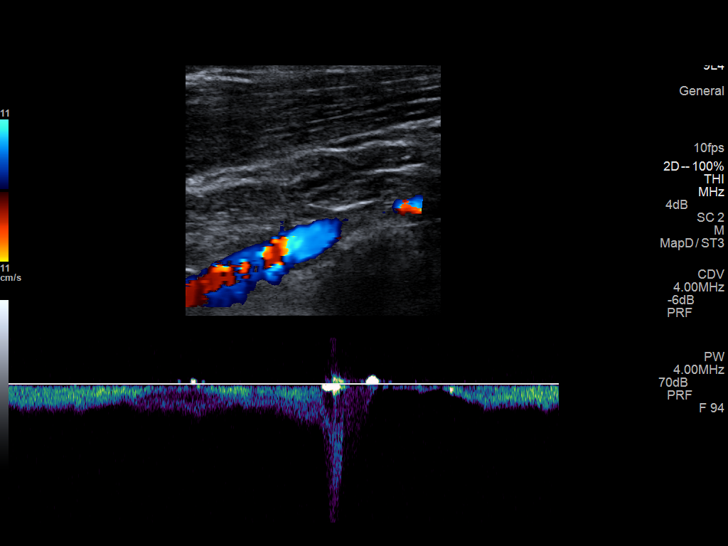
[im 21/49]
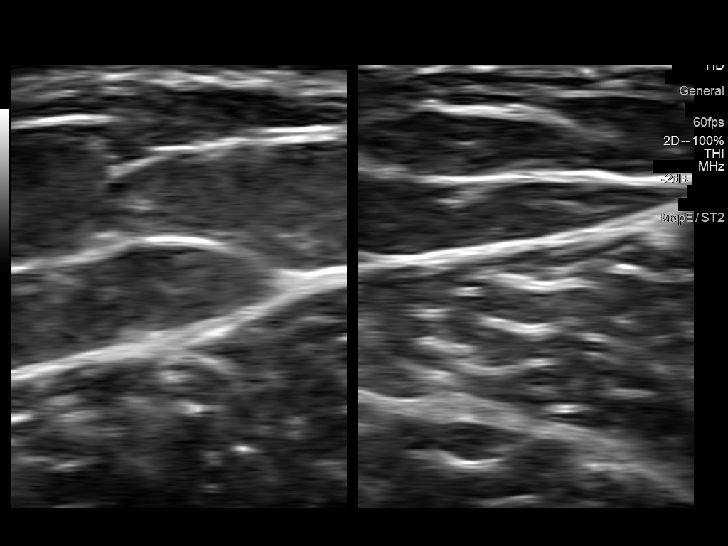
[im 26/49]
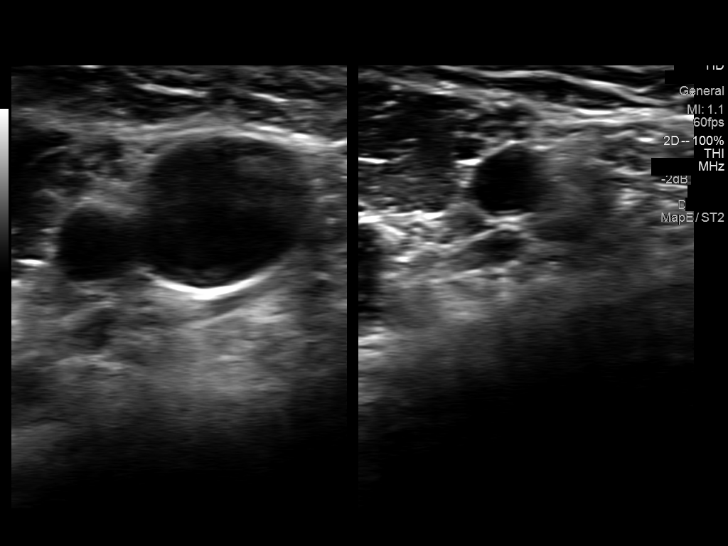
[im 28/49]
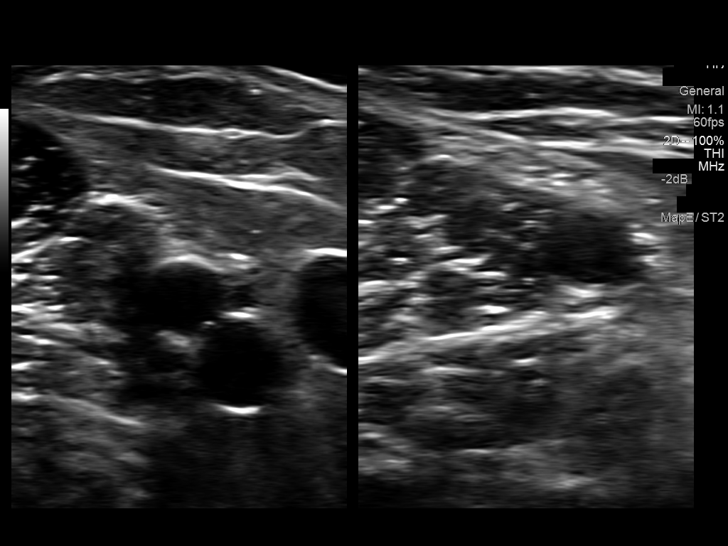
[im 32/49]
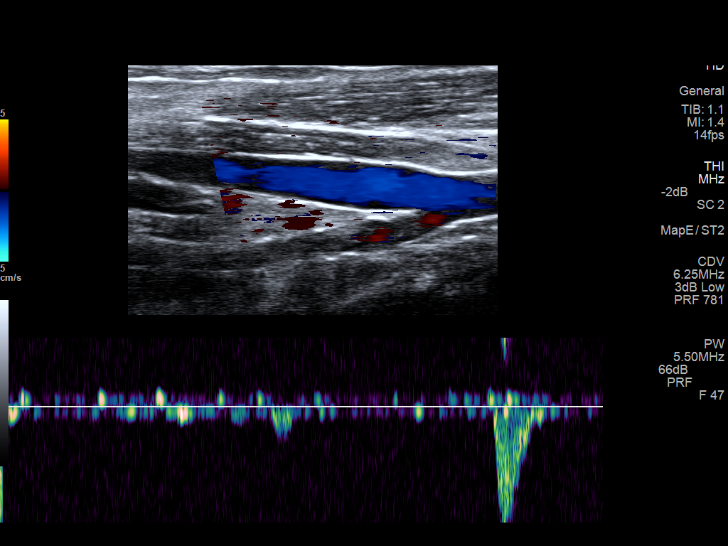
[im 36/49]
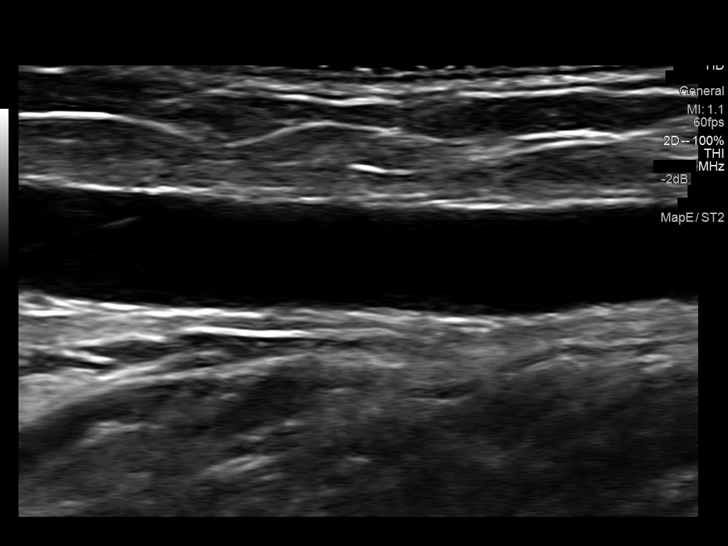
[im 40/49]
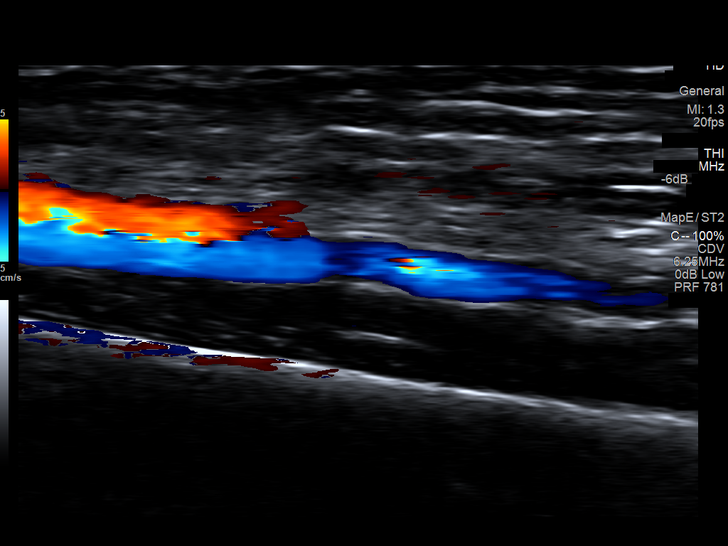
[im 44/49]
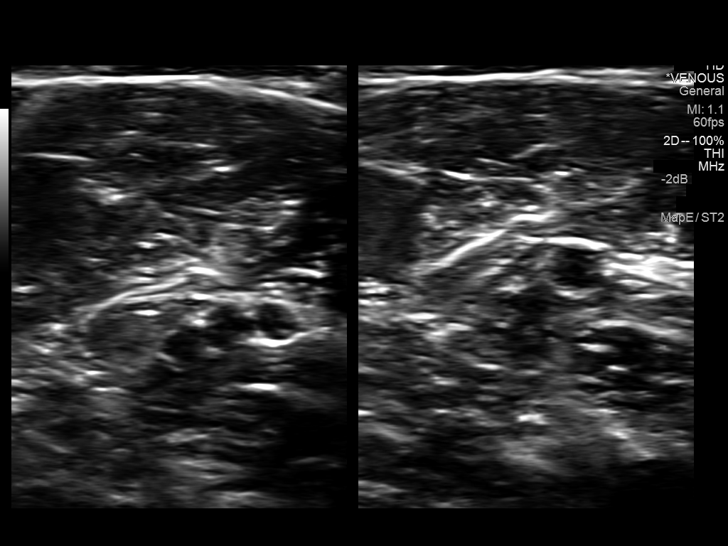
[im 49/49]
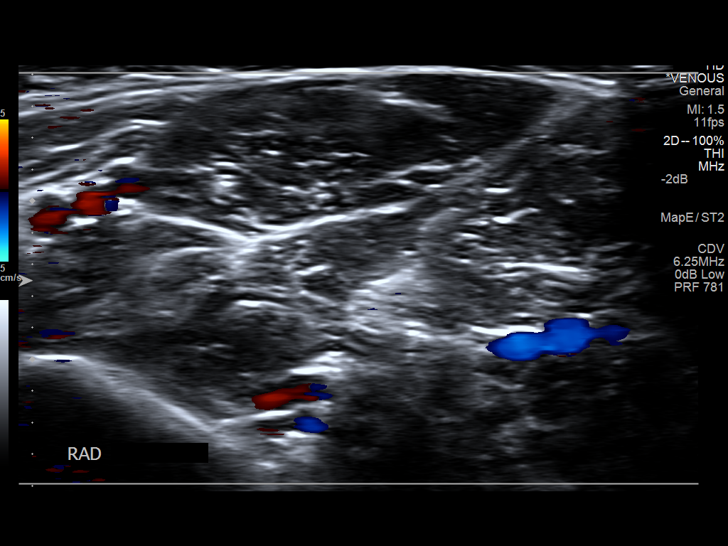

[13 of 24 positions shown; findings below may reference images not displayed]

FINDINGS: Contralateral Subclavian Vein: Respiratory phasicity is normal and
symmetric with the symptomatic side. No evidence of thrombus. Normal
compressibility.

Internal Jugular Vein: No evidence of thrombus. Normal
compressibility, respiratory phasicity and response to augmentation.

Subclavian Vein: No evidence of thrombus. Normal compressibility,
respiratory phasicity and response to augmentation.

Axillary Vein: No evidence of thrombus. Normal compressibility,
respiratory phasicity and response to augmentation.

Cephalic Vein: No evidence of thrombus. Normal compressibility,
respiratory phasicity and response to augmentation.

Basilic Vein: No evidence of thrombus. Normal compressibility,
respiratory phasicity and response to augmentation.

Brachial Veins: No evidence of thrombus. Normal compressibility,
respiratory phasicity and response to augmentation.

Radial Veins: No evidence of thrombus. Normal compressibility,
respiratory phasicity and response to augmentation.

Ulnar Veins: No evidence of thrombus. Normal compressibility,
respiratory phasicity and response to augmentation.

Venous Reflux:  None visualized.

Other Findings:  None visualized.
IMPRESSION: No evidence of deep venous thrombosis, LEFT UPPER EXTREMITY.

## 2016-04-17 MED ORDER — CYANOCOBALAMIN 1000 MCG/ML IJ SOLN: INTRAMUSCULAR | Status: DC

## 2016-04-17 MED ORDER — ERGOCALCIFEROL 1.25 MG (50000 UT) PO CAPS: 50000.0000 [IU] | ORAL_CAPSULE | ORAL | Status: DC

## 2016-04-18 ENCOUNTER — Encounter: Payer: Self-pay | Admitting: Internal Medicine

## 2016-04-18 MED ORDER — SYRINGE (DISPOSABLE) 3 ML MISC: Status: DC

## 2016-04-19 ENCOUNTER — Telehealth: Payer: Self-pay

## 2016-04-19 DIAGNOSIS — Z79899 Other long term (current) drug therapy: Secondary | ICD-10-CM | POA: Diagnosis not present

## 2016-04-19 NOTE — Telephone Encounter (Signed)
PA for Cyanocobalamin started on Cover my meds.

## 2016-04-20 ENCOUNTER — Encounter: Payer: Self-pay | Admitting: Internal Medicine

## 2016-04-22 IMAGING — CR DG ABDOMEN 2V
1 series · 3 of 3 positions shown · non-contrast
Comparison: February 04, 2009.

CLINICAL DATA: Constipation, abdominal pain.

EXAM:
ABDOMEN - 2 VIEW

[Series 1: dxr abdomen 2 v flat and erect · 0.14mm/px · 3 of 3 slices shown]
[im 1/3]
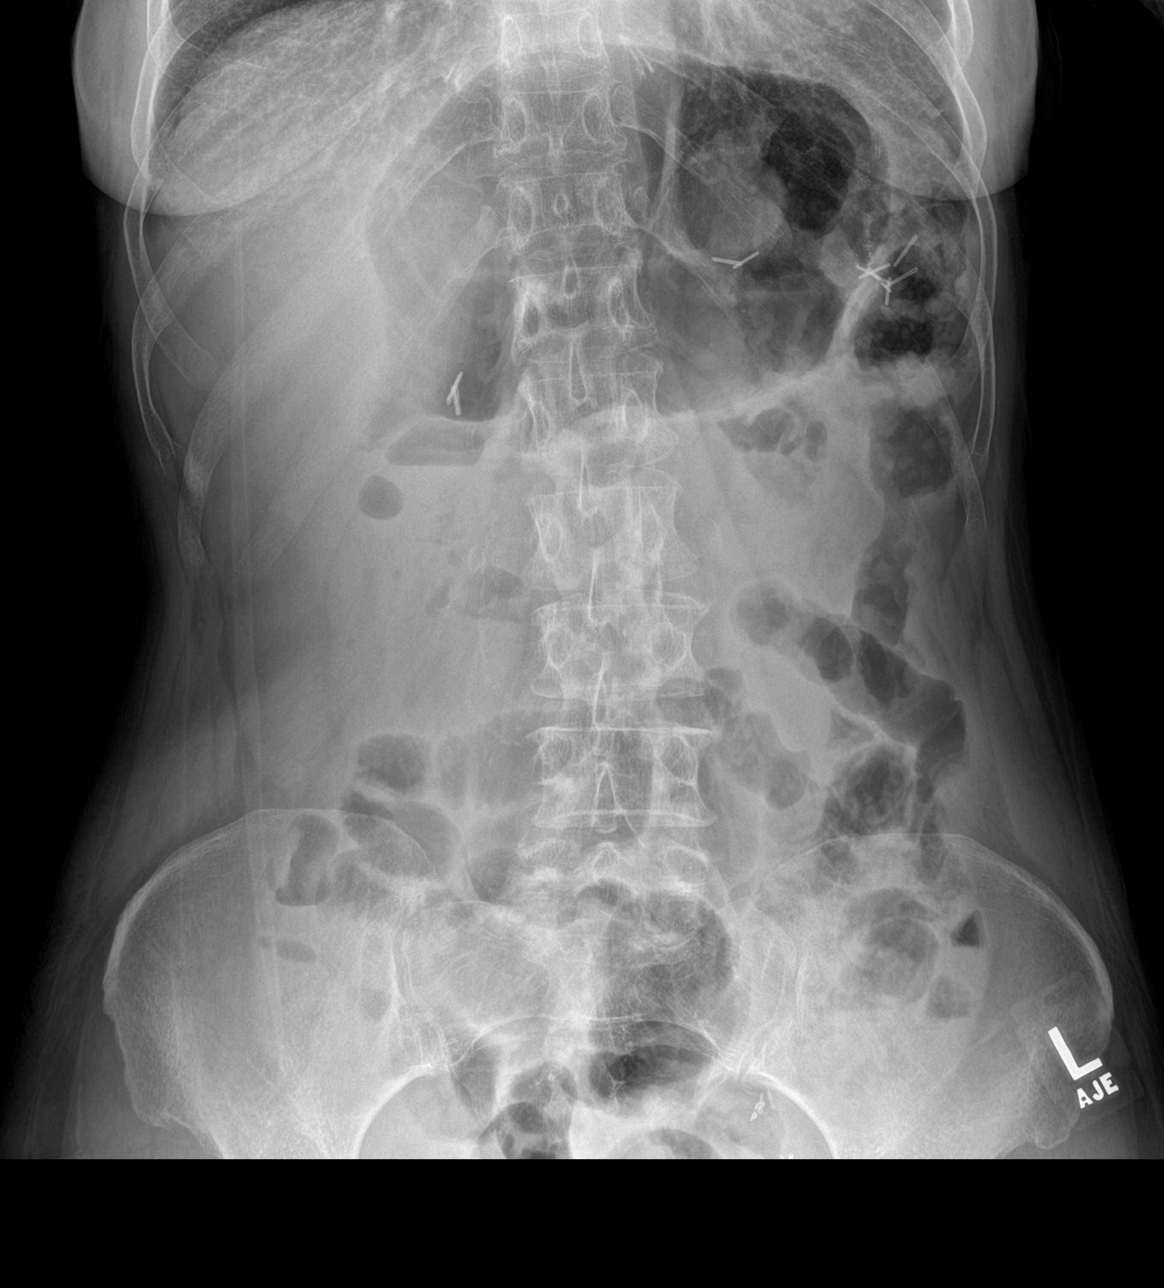
[im 2/3]
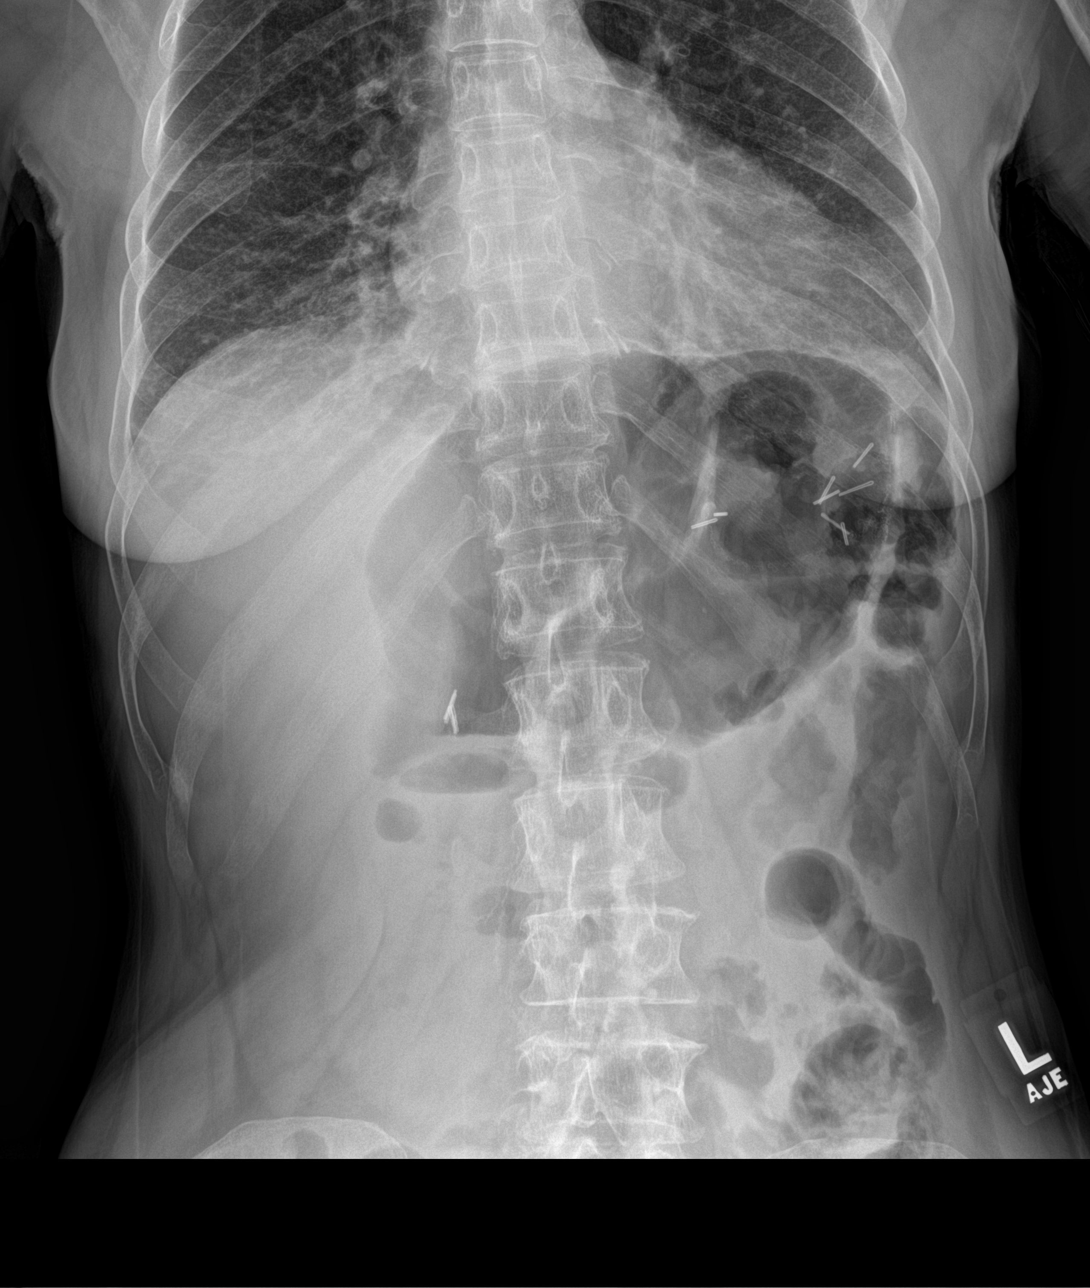
[im 3/3]
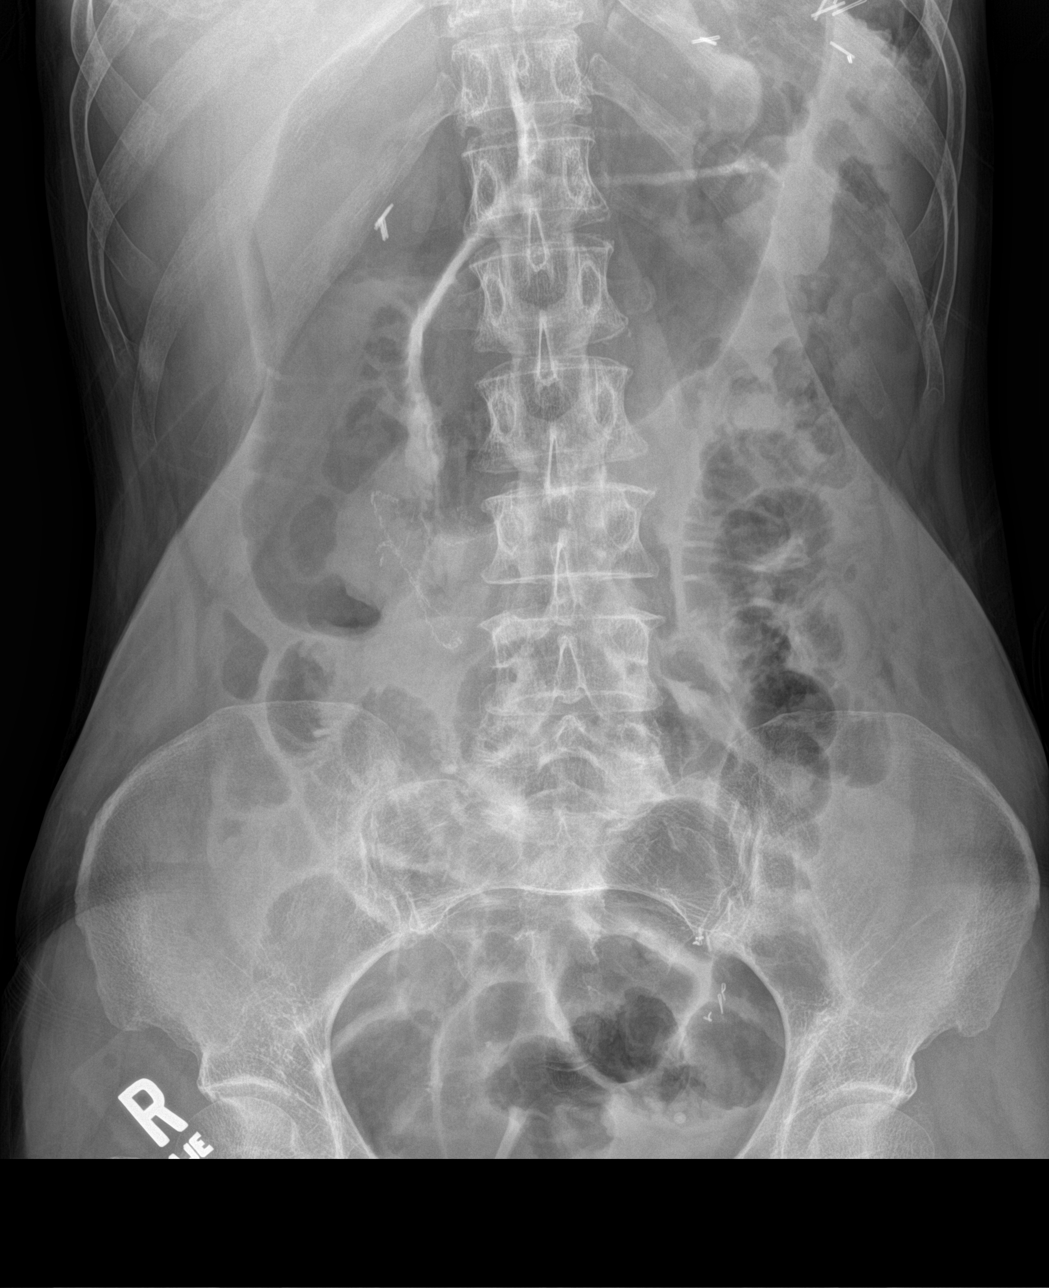

[3 of 3 positions shown; findings below may reference images not displayed]

FINDINGS: The bowel gas pattern is normal. There is no evidence of free air.
Status post cholecystectomy. Surgical clips seen in left upper
quadrant. No significant stool burden is noted. Surgical clips seen
in left pelvis is well. No abnormal calcifications are noted.
IMPRESSION: No evidence of bowel obstruction or ileus.

## 2016-04-22 MED ORDER — CYANOCOBALAMIN 1000 MCG SL SUBL: 1.0000 | SUBLINGUAL_TABLET | Freq: Every day | SUBLINGUAL | Status: DC

## 2016-04-22 NOTE — Telephone Encounter (Signed)
Spoke with the patient, verbalized understanding and will take them after the shots, thanks

## 2016-04-22 NOTE — Addendum Note (Signed)
Addended by: Crecencio Mc on: 04/22/2016 12:35 PM   Modules accepted: Orders

## 2016-04-22 NOTE — Telephone Encounter (Signed)
Will they cover her getting it done here? If not,  She can either pay out of pocket or try taking sub lingual  supplements daily

## 2016-04-22 NOTE — Telephone Encounter (Signed)
SPoke with the patient, they gave her the first month's worth of weekly shots when she picked them up from the pharmacy.  She she is going to take those three weekly shots and then take oral B12 supplements from there (1085m) will that work? .  thanks

## 2016-04-22 NOTE — Telephone Encounter (Signed)
PA denied, it is an exclusion on medicare Part D coverage.  Please advise.

## 2016-04-22 NOTE — Telephone Encounter (Signed)
Yes, sent sublingual tablets to pharmacy to take daily

## 2016-04-22 NOTE — Telephone Encounter (Signed)
The office will call you with the appointment for the bone density test

## 2016-05-06 ENCOUNTER — Other Ambulatory Visit: Payer: Self-pay | Admitting: Internal Medicine

## 2016-05-09 ENCOUNTER — Encounter: Payer: Self-pay | Admitting: Internal Medicine

## 2016-05-24 ENCOUNTER — Telehealth: Payer: Self-pay | Admitting: *Deleted

## 2016-05-24 NOTE — Telephone Encounter (Signed)
Please let Tamara [name] know form was not received. thanks

## 2016-05-24 NOTE — Telephone Encounter (Signed)
No I have not received form.

## 2016-05-24 NOTE — Telephone Encounter (Signed)
Please advise if this was received? thanks 

## 2016-05-24 NOTE — Telephone Encounter (Signed)
Anderson Malta from [employer] requested to know if the provider received the attending physicians statement.  Fax [phone] If this form has no been received please call Anderson Malta at [phone]

## 2016-05-24 NOTE — Telephone Encounter (Signed)
Forms will be re-faxed by [employer]

## 2016-05-26 ENCOUNTER — Encounter: Payer: Self-pay | Admitting: Internal Medicine

## 2016-05-27 MED ORDER — GABAPENTIN 300 MG PO CAPS: 300.0000 mg | ORAL_CAPSULE | Freq: Every day | ORAL | Status: DC

## 2016-05-27 MED ORDER — SERTRALINE HCL 100 MG PO TABS: 100.0000 mg | ORAL_TABLET | Freq: Every day | ORAL | Status: DC

## 2016-05-31 NOTE — Telephone Encounter (Signed)
[employer] has requested a update on this form.

## 2016-06-04 ENCOUNTER — Telehealth: Payer: Self-pay | Admitting: *Deleted

## 2016-06-04 NOTE — Telephone Encounter (Signed)
This form was not received again, not sure that they sent the fax to the right number. thanks

## 2016-06-11 DIAGNOSIS — L814 Other melanin hyperpigmentation: Secondary | ICD-10-CM | POA: Diagnosis not present

## 2016-06-11 DIAGNOSIS — Z789 Other specified health status: Secondary | ICD-10-CM | POA: Diagnosis not present

## 2016-06-11 DIAGNOSIS — L538 Other specified erythematous conditions: Secondary | ICD-10-CM | POA: Diagnosis not present

## 2016-06-11 DIAGNOSIS — B078 Other viral warts: Secondary | ICD-10-CM | POA: Diagnosis not present

## 2016-07-04 ENCOUNTER — Ambulatory Visit
Admission: RE | Admit: 2016-07-04 | Discharge: 2016-07-04 | Disposition: A | Discharge status: Home / Self Care | Payer: Medicare Other | Source: Ambulatory Visit | Attending: Internal Medicine | Admitting: Internal Medicine

## 2016-07-04 DIAGNOSIS — Z1239 Encounter for other screening for malignant neoplasm of breast: Secondary | ICD-10-CM

## 2016-07-04 IMAGING — MR MR HEAD W/O CM - MRA HEAD W/O CM
11 series · 41 of 48 positions shown · non-contrast
Comparison: MRA intracranial 03/19/2014.  MR brain 05/16/2010.

CLINICAL DATA: Syncope upon standing.  Headaches.

EXAM:
MRI HEAD WITHOUT CONTRAST
MRA HEAD WITHOUT CONTRAST
TECHNIQUE: Multiplanar, multiecho pulse sequences of the brain and surrounding
structures were obtained without intravenous contrast. Angiographic
images of the head were obtained using MRA technique without
contrast.

[Series 3: tof_3d_multi-slab · axial · 0.7mm · 0.37mm/px · z∈[-42,+23]mm · 6 of 140 slices shown]
[im 1/140]
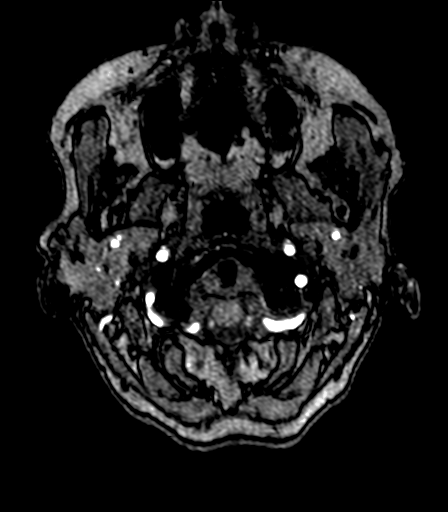
[im 24/140]
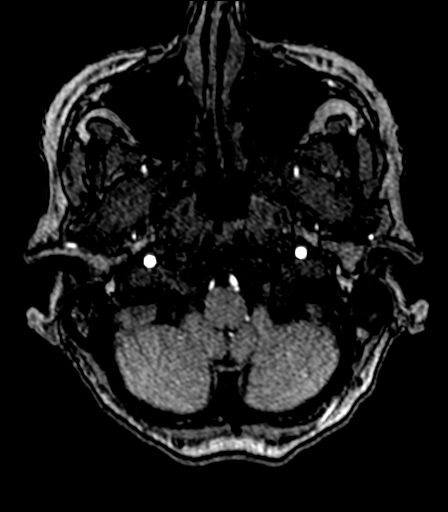
[im 47/140]
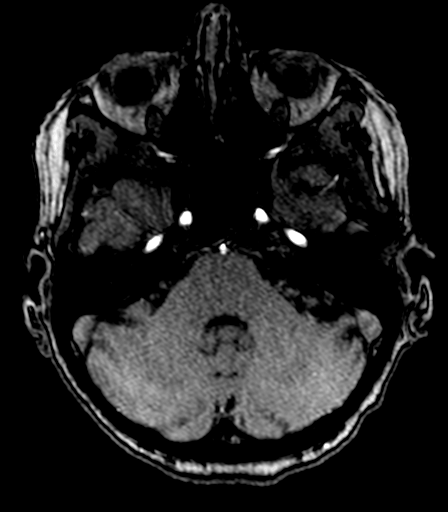
[im 58/140]
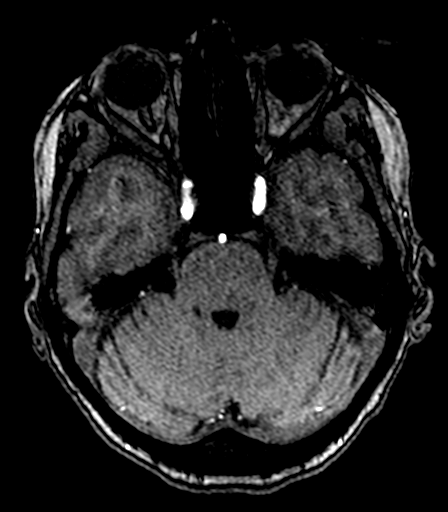
[im 82/140]
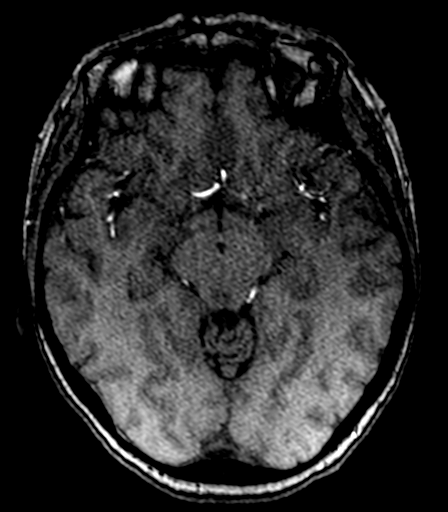
[im 93/140]
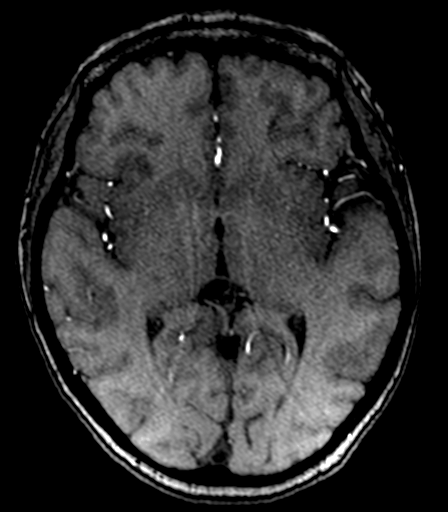

[Series 9: T1 · sagittal · 5.0mm · 0.45mm/px · 3 of 29 slices shown (1 of 2)]
[im 1/29]
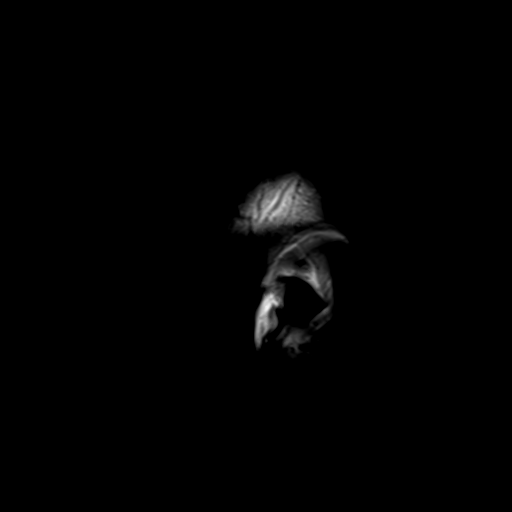
[im 15/29]
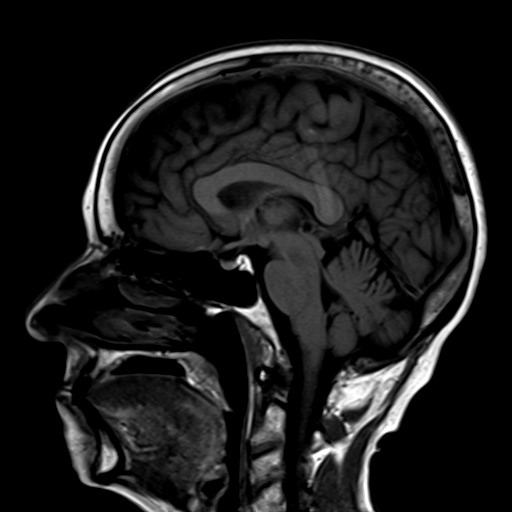
[im 29/29]
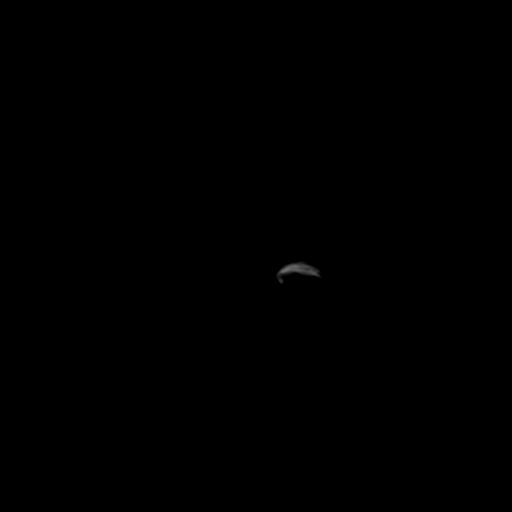

[Series 12: DWI · axial · 3.0mm · 1.80mm/px · z∈[-47,+114]mm · 5 of 54 slices shown (1 of 4)]
[im 1/54]
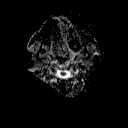
[im 14/54]
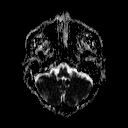
[im 27/54]
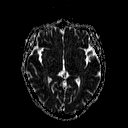
[im 40/54]
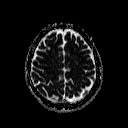
[im 54/54]
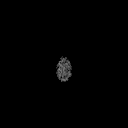

[Series 14: DWI · coronal · 3.0mm · 1.80mm/px · 4 of 45 slices shown (2 of 4)]
[im 1/45]
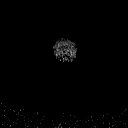
[im 15/45]
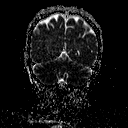
[im 30/45]
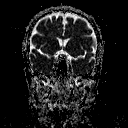
[im 45/45]
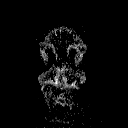

[Series 15: T2 · axial · 5.0mm · 0.60mm/px · z∈[-39,+104]mm · 2 of 23 slices shown (1 of 3)]
[im 1/23]
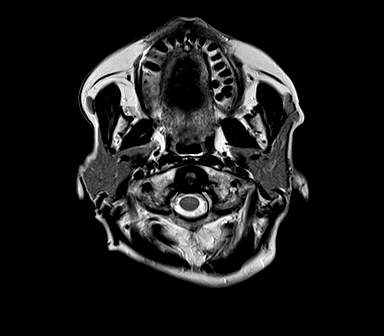
[im 23/23]
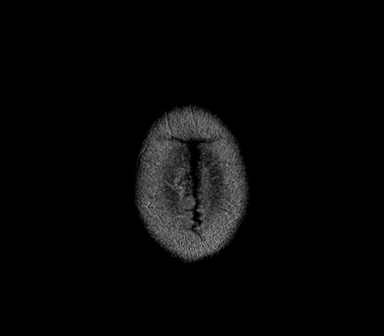

[Series 16: FLAIR · axial · 5.0mm · 0.45mm/px · z∈[-38,+105]mm · 2 of 23 slices shown]
[im 1/23]
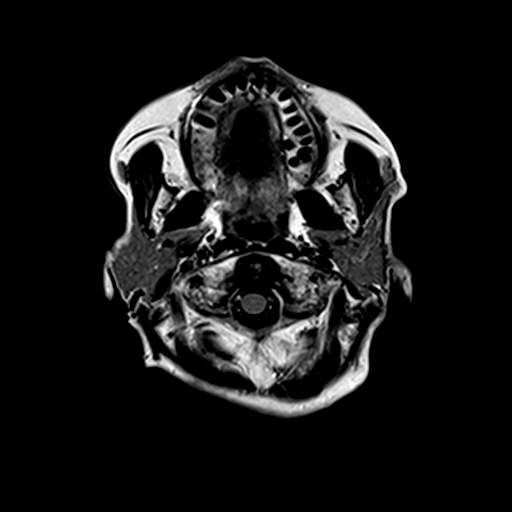
[im 23/23]
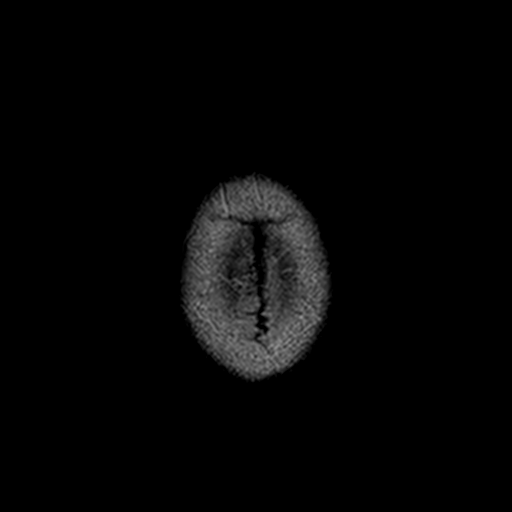

[Series 17: T2 · axial · 5.0mm · 0.45mm/px · z∈[-39,+104]mm · 2 of 23 slices shown (2 of 3)]
[im 1/23]
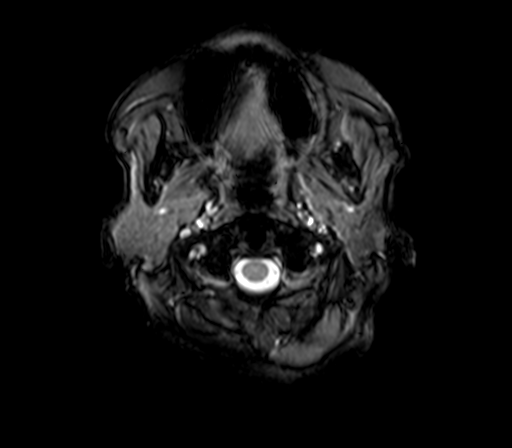
[im 23/23]
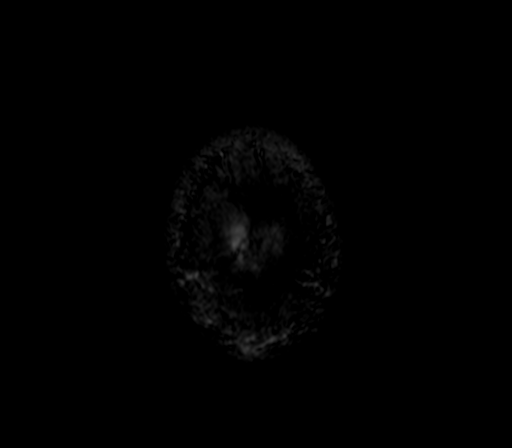

[Series 18: T1 · axial · 3.0mm · 1.00mm/px · z∈[-56,+121]mm · 5 of 60 slices shown (2 of 2)]
[im 1/60]
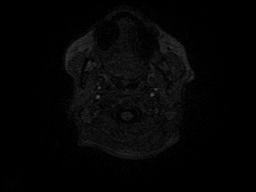
[im 15/60]
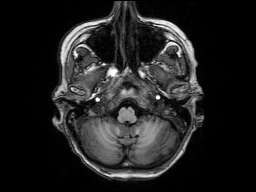
[im 30/60]
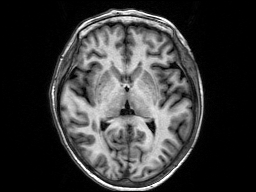
[im 45/60]
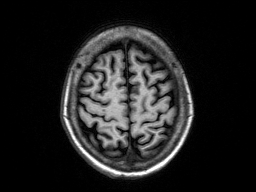
[im 60/60]
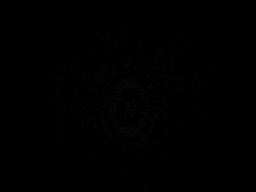

[Series 19: T2 · coronal · 5.0mm · 0.49mm/px · 3 of 29 slices shown (3 of 3)]
[im 1/29]
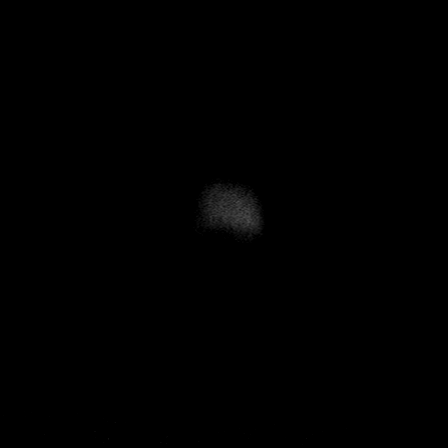
[im 15/29]
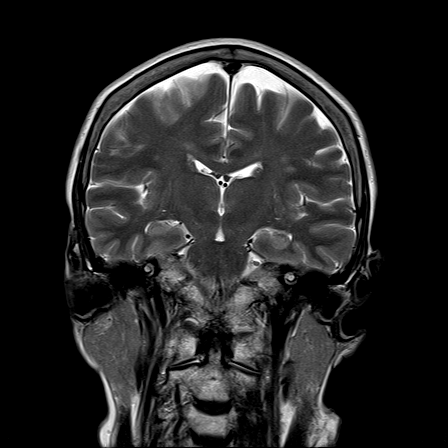
[im 29/29]
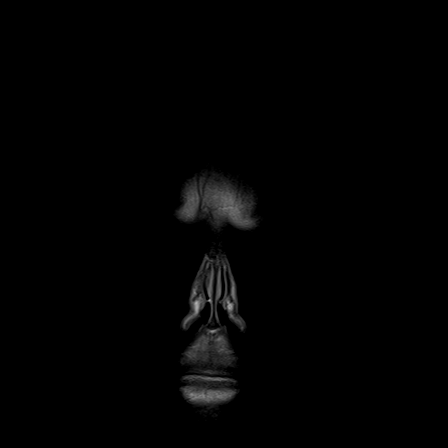

[Series 106: DWI · axial · 3.0mm · 1.80mm/px · z∈[-47,+114]mm · 5 of 54 slices shown (3 of 4)]
[im 1/54]
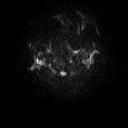
[im 14/54]
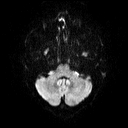
[im 27/54]
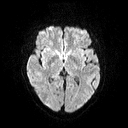
[im 40/54]
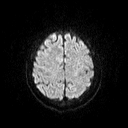
[im 54/54]
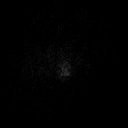

[Series 108: DWI · coronal · 3.0mm · 1.80mm/px · 4 of 45 slices shown (4 of 4)]
[im 1/45]
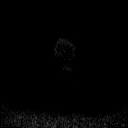
[im 15/45]
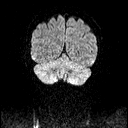
[im 30/45]
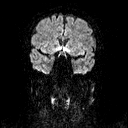
[im 45/45]
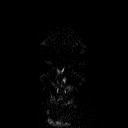

[41 of 48 positions shown; findings below may reference images not displayed]

FINDINGS: MRI HEAD FINDINGS

No evidence for acute infarction, hemorrhage, mass lesion,
hydrocephalus, or extra-axial fluid. Normal for age cerebral volume.
Mild to moderate T2 and FLAIR hyperintensities throughout the
periventricular and subcortical white matter, similar to that seen
in 9633, consistent with chronic microvascular ischemic change.

Flow voids are maintained throughout the carotid, basilar, and
vertebral arteries. There are no areas of chronic hemorrhage.
Pituitary, pineal, and cerebellar tonsils unremarkable. No upper
cervical lesions. Extracranial soft tissues unremarkable.

MRA HEAD FINDINGS

Internal carotid arteries are normal bilaterally throughout their
visualized is course from the upper cervical region through the
termini. Normal anterior and middle cerebral arteries proximally.
Mild attenuation of the distal ACA and MCA branches consistent with
intracranial atherosclerotic disease.

In the posterior circulation, LEFT vertebral remains dominant
without focal narrowing. Flow reducing stenosis of the V4 segment,
distal RIGHT vertebral estimated 75-90% stenosis. There is
artifactual signal loss in the proximal basilar, as noted on axial
source images which appears to be due to misregistration. No
proximal PCA disease. No cerebellar branch occlusion.

Compared with prior studies, similar appearance is noted.
IMPRESSION: Stable mild chronic microvascular ischemic change.

No acute intracranial findings.

Flow reducing lesion of the distal but non dominant RIGHT vertebral
of uncertain significance. Misregistration of signal from the
proximal basilar, but no focal posterior circulation stenosis is
suspected.

## 2016-07-23 ENCOUNTER — Other Ambulatory Visit: Payer: Self-pay | Admitting: Internal Medicine

## 2016-08-02 ENCOUNTER — Ambulatory Visit (INDEPENDENT_AMBULATORY_CARE_PROVIDER_SITE_OTHER): Payer: Medicare Other | Admitting: Family Medicine

## 2016-08-02 ENCOUNTER — Encounter: Payer: Self-pay | Admitting: Family Medicine

## 2016-08-02 VITALS — BP 112/64 | HR 105 | Temp 98.1°F | Wt 104.8 lb

## 2016-08-02 DIAGNOSIS — R05 Cough: Secondary | ICD-10-CM | POA: Diagnosis not present

## 2016-08-02 DIAGNOSIS — J0100 Acute maxillary sinusitis, unspecified: Secondary | ICD-10-CM | POA: Diagnosis not present

## 2016-08-02 DIAGNOSIS — R059 Cough, unspecified: Secondary | ICD-10-CM

## 2016-08-02 MED ORDER — PREDNISONE 20 MG PO TABS: 40.0000 mg | ORAL_TABLET | Freq: Every day | ORAL | 0 refills | Status: DC

## 2016-08-02 MED ORDER — AMOXICILLIN-POT CLAVULANATE 875-125 MG PO TABS: 1.0000 | ORAL_TABLET | Freq: Two times a day (BID) | ORAL | 0 refills | Status: DC

## 2016-08-02 NOTE — Patient Instructions (Signed)
Nice to meet you. You likely have a sinus infection though could also have bronchitis. We will treat you with Augmentin and prednisone for these symptoms. You should use her albuterol inhaler every 6 hours for the next 2 days and then as needed. If you develop chest pressure, persistent shortness of breath, cough productive of blood, fevers, or any new or changing symptoms please seek medical attention.

## 2016-08-02 NOTE — Progress Notes (Signed)
  Tommi Rumps, MD Phone: [phone]  Tamara [name] is a 70 y.o. female who presents today for same-day visit.  Patient notes for the last 2 weeks she's had symptoms. Has had significant sinus congestion and pressure. Is blowing clear mucus out of her nose. Some postnasal drip. Had low-grade fever earlier this week in the 68F range. She started to cough and feel congested in her chest as well. Notes mild tightness with this though no chest pressure. Some shortness of breath as well. She's been taking Mucinex and using albuterol at home  PMH: Former smoker   ROS see history of present illness  Objective  Physical Exam Vitals:   08/02/16 1446  BP: 112/64  Pulse: (!) 105  Temp: 98.1 F (36.7 C)    BP Readings from Last 3 Encounters:  08/02/16 112/64  04/15/16 118/60  10/24/15 122/65   Wt Readings from Last 3 Encounters:  08/02/16 104 lb 12.8 oz (47.5 kg)  04/15/16 104 lb (47.2 kg)  10/24/15 107 lb (48.5 kg)    Physical Exam  Constitutional: No distress.  HENT:  Head: Normocephalic and atraumatic.  Mouth/Throat: Oropharynx is clear and moist. No oropharyngeal exudate.  Normal TMs bilaterally  Eyes: Conjunctivae are normal. Pupils are equal, round, and reactive to light.  Neck: Neck supple.  Cardiovascular: Normal rate, regular rhythm and normal heart sounds.   Pulmonary/Chest: Effort normal. No respiratory distress. She has wheezes (scattered expiratory wheezes). She has no rales.  Lymphadenopathy:    She has no cervical adenopathy (no enlarged lymph nodes though does have some tender lymph nodes).  Neurological: She is alert. Gait normal.  Skin: Skin is warm and dry. She is not diaphoretic.     Assessment/Plan: Please see individual problem list.  Sinusitis, acute, maxillary Patient's symptoms most consistent with sinusitis. Could also have some degree of bronchitis. Given wheezing we gave her an albuterol nebulizer treatment in the office which she reports  was not beneficial though her lung sounds were improved. We'll start on Augmentin for sinus infection coverage and started on prednisone given her wheezing. She'll continue to use her albuterol inhaler at home. We are going to order a chest x-ray as well though the patient opted to defer this and obtain this if she does not improve with treatment. She is given return precautions.   Orders Placed This Encounter  Procedures  . DG Chest 2 View    Standing Status:   Future    Standing Expiration Date:   10/02/2017    Order Specific Question:   Reason for Exam (SYMPTOM  OR DIAGNOSIS REQUIRED)    Answer:   cough, wheezing, shortness of breath    Order Specific Question:   Preferred imaging location?    Answer:   [facility] ordered this encounter  Medications  . amoxicillin-clavulanate (AUGMENTIN) 875-125 MG tablet    Sig: Take 1 tablet by mouth 2 (two) times daily.    Dispense:  14 tablet    Refill:  0  . predniSONE (DELTASONE) 20 MG tablet    Sig: Take 2 tablets (40 mg total) by mouth daily with breakfast.    Dispense:  10 tablet    Refill:  0    Tommi Rumps, MD [facility]

## 2016-08-02 NOTE — Assessment & Plan Note (Addendum)
Patient's symptoms most consistent with sinusitis. Could also have some degree of bronchitis. Given wheezing we gave her an albuterol nebulizer treatment in the office which she reports was not beneficial though her lung sounds were improved. We'll start on Augmentin for sinus infection coverage and started on prednisone given her wheezing. She'll continue to use her albuterol inhaler at home. We are going to order a chest x-ray as well though the patient opted to defer this and obtain this if she does not improve with treatment. She is given return precautions.

## 2016-08-02 NOTE — Progress Notes (Signed)
Pre visit review using our clinic review tool, if applicable. No additional management support is needed unless otherwise documented below in the visit note. 

## 2016-08-04 ENCOUNTER — Other Ambulatory Visit: Payer: Self-pay | Admitting: Internal Medicine

## 2016-08-05 ENCOUNTER — Encounter: Payer: Self-pay | Admitting: Family Medicine

## 2016-08-26 ENCOUNTER — Encounter: Payer: Self-pay | Admitting: Family Medicine

## 2016-08-26 ENCOUNTER — Telehealth: Payer: Self-pay | Admitting: Internal Medicine

## 2016-08-26 ENCOUNTER — Ambulatory Visit (INDEPENDENT_AMBULATORY_CARE_PROVIDER_SITE_OTHER): Payer: Medicare Other | Admitting: Family Medicine

## 2016-08-26 ENCOUNTER — Ambulatory Visit
Admission: RE | Admit: 2016-08-26 | Discharge: 2016-08-26 | Disposition: A | Discharge status: Home / Self Care | Payer: Medicare Other | Source: Ambulatory Visit | Attending: Family Medicine | Admitting: Family Medicine

## 2016-08-26 VITALS — BP 116/72 | HR 103 | Temp 98.1°F | Wt 104.5 lb

## 2016-08-26 DIAGNOSIS — S0990XA Unspecified injury of head, initial encounter: Secondary | ICD-10-CM

## 2016-08-26 DIAGNOSIS — X58XXXA Exposure to other specified factors, initial encounter: Secondary | ICD-10-CM | POA: Insufficient documentation

## 2016-08-26 DIAGNOSIS — R51 Headache: Secondary | ICD-10-CM | POA: Diagnosis not present

## 2016-08-26 NOTE — Telephone Encounter (Signed)
Tried to reach patient line busy no answer. Tried to reach patient a second time no answr.

## 2016-08-26 NOTE — Patient Instructions (Signed)
We will call with the results of your CT.  Use Tylenol as needed for headache.  Follow up soon with Dr. Derrel Nip   Concussion, Adult A concussion, or closed-head injury, is a brain injury caused by a direct blow to the head or by a quick and sudden movement (jolt) of the head or neck. Concussions are usually not life-threatening. Even so, the effects of a concussion can be serious. If you have had a concussion before, you are more likely to experience concussion-like symptoms after a direct blow to the head.  CAUSES  Direct blow to the head, such as from running into another player during a soccer game, being hit in a fight, or hitting your head on a hard surface.  A jolt of the head or neck that causes the brain to move back and forth inside the skull, such as in a car crash. SIGNS AND SYMPTOMS The signs of a concussion can be hard to notice. Early on, they may be missed by you, family members, and health care providers. You may look fine but act or feel differently. Symptoms are usually temporary, but they may last for days, weeks, or even longer. Some symptoms may appear right away while others may not show up for hours or days. Every head injury is different. Symptoms include:  Mild to moderate headaches that will not go away.  A feeling of pressure inside your head.  Having more trouble than usual:  Learning or remembering things you have heard.  Answering questions.  Paying attention or concentrating.  Organizing daily tasks.  Making decisions and solving problems.  Slowness in thinking, acting or reacting, speaking, or reading.  Getting lost or being easily confused.  Feeling tired all the time or lacking energy (fatigued).  Feeling drowsy.  Sleep disturbances.  Sleeping more than usual.  Sleeping less than usual.  Trouble falling asleep.  Trouble sleeping (insomnia).  Loss of balance or feeling lightheaded or dizzy.  Nausea or vomiting.  Numbness or  tingling.  Increased sensitivity to:  Sounds.  Lights.  Distractions.  Vision problems or eyes that tire easily.  Diminished sense of taste or smell.  Ringing in the ears.  Mood changes such as feeling sad or anxious.  Becoming easily irritated or angry for little or no reason.  Lack of motivation.  Seeing or hearing things other people do not see or hear (hallucinations). DIAGNOSIS Your health care provider can usually diagnose a concussion based on a description of your injury and symptoms. He or she will ask whether you passed out (lost consciousness) and whether you are having trouble remembering events that happened right before and during your injury. Your evaluation might include:  A brain scan to look for signs of injury to the brain. Even if the test shows no injury, you may still have a concussion.  Blood tests to be sure other problems are not present. TREATMENT  Concussions are usually treated in an emergency department, in urgent care, or at a clinic. You may need to stay in the hospital overnight for further treatment.  Tell your health care provider if you are taking any medicines, including prescription medicines, over-the-counter medicines, and natural remedies. Some medicines, such as blood thinners (anticoagulants) and aspirin, may increase the chance of complications. Also tell your health care provider whether you have had alcohol or are taking illegal drugs. This information may affect treatment.  Your health care provider will send you home with important instructions to follow.  How fast  you will recover from a concussion depends on many factors. These factors include how severe your concussion is, what part of your brain was injured, your age, and how healthy you were before the concussion.  Most people with mild injuries recover fully. Recovery can take time. In general, recovery is slower in older persons. Also, persons who have had a concussion in  the past or have other medical problems may find that it takes longer to recover from their current injury. HOME CARE INSTRUCTIONS General Instructions  Carefully follow the directions your health care provider gave you.  Only take over-the-counter or prescription medicines for pain, discomfort, or fever as directed by your health care provider.  Take only those medicines that your health care provider has approved.  Do not drink alcohol until your health care provider says you are well enough to do so. Alcohol and certain other drugs may slow your recovery and can put you at risk of further injury.  If it is harder than usual to remember things, write them down.  If you are easily distracted, try to do one thing at a time. For example, do not try to watch TV while fixing dinner.  Talk with family members or close friends when making important decisions.  Keep all follow-up appointments. Repeated evaluation of your symptoms is recommended for your recovery.  Watch your symptoms and tell others to do the same. Complications sometimes occur after a concussion. Older adults with a brain injury may have a higher risk of serious complications, such as a blood clot on the brain.  Tell your teachers, school nurse, school counselor, coach, athletic trainer, or work Freight forwarder about your injury, symptoms, and restrictions. Tell them about what you can or cannot do. They should watch for:  Increased problems with attention or concentration.  Increased difficulty remembering or learning new information.  Increased time needed to complete tasks or assignments.  Increased irritability or decreased ability to cope with stress.  Increased symptoms.  Rest. Rest helps the brain to heal. Make sure you:  Get plenty of sleep at night. Avoid staying up late at night.  Keep the same bedtime hours on weekends and weekdays.  Rest during the day. Take daytime naps or rest breaks when you feel  tired.  Limit activities that require a lot of thought or concentration. These include:  Doing homework or job-related work.  Watching TV.  Working on the computer.  Avoid any situation where there is potential for another head injury (football, hockey, soccer, basketball, martial arts, downhill snow sports and horseback riding). Your condition will get worse every time you experience a concussion. You should avoid these activities until you are evaluated by the appropriate follow-up health care providers. Returning To Your Regular Activities You will need to return to your normal activities slowly, not all at once. You must give your body and brain enough time for recovery.  Do not return to sports or other athletic activities until your health care provider tells you it is safe to do so.  Ask your health care provider when you can drive, ride a bicycle, or operate heavy machinery. Your ability to react may be slower after a brain injury. Never do these activities if you are dizzy.  Ask your health care provider about when you can return to work or school. Preventing Another Concussion It is very important to avoid another brain injury, especially before you have recovered. In rare cases, another injury can lead to permanent brain damage,  brain swelling, or death. The risk of this is greatest during the first 7-10 days after a head injury. Avoid injuries by:  Wearing a seat belt when riding in a car.  Drinking alcohol only in moderation.  Wearing a helmet when biking, skiing, skateboarding, skating, or doing similar activities.  Avoiding activities that could lead to a second concussion, such as contact or recreational sports, until your health care provider says it is okay.  Taking safety measures in your home.  Remove clutter and tripping hazards from floors and stairways.  Use grab bars in bathrooms and handrails by stairs.  Place non-slip mats on floors and in  bathtubs.  Improve lighting in dim areas. SEEK MEDICAL CARE IF:  You have increased problems paying attention or concentrating.  You have increased difficulty remembering or learning new information.  You need more time to complete tasks or assignments than before.  You have increased irritability or decreased ability to cope with stress.  You have more symptoms than before. Seek medical care if you have any of the following symptoms for more than 2 weeks after your injury:  Lasting (chronic) headaches.  Dizziness or balance problems.  Nausea.  Vision problems.  Increased sensitivity to noise or light.  Depression or mood swings.  Anxiety or irritability.  Memory problems.  Difficulty concentrating or paying attention.  Sleep problems.  Feeling tired all the time. SEEK IMMEDIATE MEDICAL CARE IF:  You have severe or worsening headaches. These may be a sign of a blood clot in the brain.  You have weakness (even if only in one hand, leg, or part of the face).  You have numbness.  You have decreased coordination.  You vomit repeatedly.  You have increased sleepiness.  One pupil is larger than the other.  You have convulsions.  You have slurred speech.  You have increased confusion. This may be a sign of a blood clot in the brain.  You have increased restlessness, agitation, or irritability.  You are unable to recognize people or places.  You have neck pain.  It is difficult to wake you up.  You have unusual behavior changes.  You lose consciousness. MAKE SURE YOU:  Understand these instructions.  Will watch your condition.  Will get help right away if you are not doing well or get worse.   This information is not intended to replace advice given to you by your health care provider. Make sure you discuss any questions you have with your health care provider.   Document Released: 01/18/2004 Document Revised: 11/18/2014 Document Reviewed:  05/20/2013 Elsevier Interactive Patient Education [employer].

## 2016-08-26 NOTE — Assessment & Plan Note (Signed)
New problem.  Given age and mechanism of injury as well as physical exam findings, sending for stat CT head. Advised PRN Tylenol for headache.

## 2016-08-26 NOTE — Telephone Encounter (Signed)
Patient seen at ED

## 2016-08-26 NOTE — Telephone Encounter (Signed)
Pt called and stated that she had a stack of baking dishes fall on her head yesterday. Stated that she has a headache that won't go away along with a knot on her head. Schedule pt for an acute visit with Dr.Cook at 9:45 but also sent patient over to [facility] Triage.

## 2016-08-26 NOTE — Telephone Encounter (Signed)
Noted thanks °

## 2016-08-26 NOTE — Progress Notes (Signed)
Pre visit review using our clinic review tool, if applicable. No additional management support is needed unless otherwise documented below in the visit note. 

## 2016-08-26 NOTE — Telephone Encounter (Signed)
[facility]  Patient Name: Tamara [name]  DOB: Mar 18, 1946    Initial Comment Caller states dishes fell out of cabinet onto her head yesterday knocking her out, having headache since then   Nurse Assessment  Nurse: Harlow Mares, RN, Suanne Marker Date/Time Eilene Ghazi Time): 08/26/2016 8:17:05 AM  Confirm and document reason for call. If symptomatic, describe symptoms. You must click the next button to save text entered. ---Caller states dishes fell out of cabinet onto her head yesterday knocking her out, having headache since then.  Has the patient traveled out of the country within the last 30 days? ---Not Applicable  Does the patient have any new or worsening symptoms? ---Yes  Will a triage be completed? ---Yes  Related visit to physician within the last 2 weeks? ---N/A  Does the PT have any chronic conditions? (i.e. diabetes, asthma, etc.) ---Yes  List chronic conditions. ---Depression; anxiety  Is this a behavioral health or substance abuse call? ---No     Guidelines    Guideline Title Affirmed Question Affirmed Notes  Head Injury [1] Knocked out (unconscious) < 1 minute AND [2] now fine    Final Disposition User   Go to ED Now (or PCP triage) Harlow Mares, RN, [facility] - ED   Disagree/Comply: Comply

## 2016-08-26 NOTE — Progress Notes (Signed)
Subjective:  Patient ID: Tamara [name], female    DOB: 03-23-1946  Age: 70 y.o. MRN: [id number]  CC: Head injury  HPI:  70 year old female presents with the above complaint.  Patient states that yesterday morning she was putting some dishes away and they subsequently slipped and fell on her head. She states that she passed out and awoke to her dogs licking her hands. She is unsure of how long she was out. She lives alone. She notes that she had a skin tear on her right forearm. She also notes that she has a bruise on her left forehead and a small abrasion on her nasal bridge as a result of the injury. Since the injury, she reports significant headache and associated photosensitivity. No reports of weakness or other neurological symptoms. She was told initially to go to the emergency department when she called our office but she refused. She has no other complaints or issues at this time.  Social Hx   Social History   Social History  . Marital status: Married    Spouse name: N/A  . Number of children: N/A  . Years of education: N/A   Social History Main Topics  . Smoking status: Former Smoker    Quit date: 12/13/2014  . Smokeless tobacco: Never Used     Comment: half to one pack of cigs per day  . Alcohol use No  . Drug use: No  . Sexual activity: Not Asked   Other Topics Concern  . None   Social History Narrative   Lives with spouse.    Review of Systems  Constitutional: Negative.   Neurological: Positive for syncope and headaches.       Photosensitivity.   Objective:  BP 116/72 (BP Location: Right Arm, Patient Position: Sitting, Cuff Size: Normal)   Pulse (!) 103   Temp 98.1 F (36.7 C) (Oral)   Wt 104 lb 8 oz (47.4 kg)   SpO2 95%   BMI 19.75 kg/m   BP/Weight 08/26/2016 6/72/0947 0/07/6282  Systolic BP 662 947 654  Diastolic BP 72 64 60  Wt. (Lbs) 104.5 104.8 104  BMI 19.75 19.8 19.66    Physical Exam  Constitutional: She is oriented to person, place,  and time. She appears well-developed. No distress.  Cardiovascular: Normal rate and regular rhythm.   Pulmonary/Chest: Effort normal. She has no wheezes. She has no rales.  Neurological: She is alert and oriented to person, place, and time. No cranial nerve deficit. Coordination normal.  Normal muscle strength. Photosensitivity noted with pupil examination.  Skin:  Small abrasion noted at the nasal bridge. Bruising noted on the left side of forehead.  Vitals reviewed.  Lab Results  Component Value Date   WBC 8.8 04/15/2016   HGB 13.7 04/15/2016   HCT 41.4 04/15/2016   PLT 277.0 04/15/2016   GLUCOSE 104 (H) 04/15/2016   CHOL 193 04/15/2016   TRIG 147.0 04/15/2016   HDL 61.30 04/15/2016   LDLDIRECT 121.1 01/26/2014   LDLCALC 102 (H) 04/15/2016   ALT 16 04/15/2016   AST 19 04/15/2016   NA 141 04/15/2016   K 4.4 04/15/2016   CL 108 04/15/2016   CREATININE 0.67 04/15/2016   BUN 11 04/15/2016   CO2 28 04/15/2016   TSH 1.59 04/15/2016   INR 0.96 03/24/2015   Assessment & Plan:   Problem List Items Addressed This Visit    Head injury - Primary    New problem.  Given age and mechanism of injury  as well as physical exam findings, sending for stat CT head. Advised PRN Tylenol for headache.       Relevant Orders   CT Head Wo Contrast    Other Visit Diagnoses   None.    Follow-up: PRN  Fries

## 2016-08-26 NOTE — Telephone Encounter (Signed)
FYI

## 2016-08-29 ENCOUNTER — Other Ambulatory Visit: Payer: Self-pay | Admitting: Internal Medicine

## 2016-08-29 ENCOUNTER — Encounter: Payer: Self-pay | Admitting: Family Medicine

## 2016-09-04 ENCOUNTER — Other Ambulatory Visit: Payer: Self-pay

## 2016-09-04 NOTE — Telephone Encounter (Signed)
Last office 04/15/16 for physical , no follow up visit scheduled.   Last filled 08/04/16 .

## 2016-09-04 NOTE — Telephone Encounter (Signed)
DENIED. PATIENT HAS BEEN ACCIDENT PRONE.  NEEDS OV WITH ME PRIOR TO REFILLS

## 2016-09-05 ENCOUNTER — Other Ambulatory Visit: Payer: Self-pay

## 2016-09-05 NOTE — Telephone Encounter (Signed)
Tried calling patient no answer .  Notified  pharmacy she need pharmacy she needs follow-up appointment.

## 2016-09-22 IMAGING — CR DG HAND COMPLETE 3+V*R*
1 series · 3 of 3 positions shown · non-contrast
Comparison: None.

CLINICAL DATA: Right hand pain, right hand injury yesterday, third
and fifth metacarpal pain

EXAM:
RIGHT HAND - COMPLETE 3+ VIEW

[Series 1: dg hand complete right · 0.14mm/px · 3 of 3 slices shown]
[im 1/3]
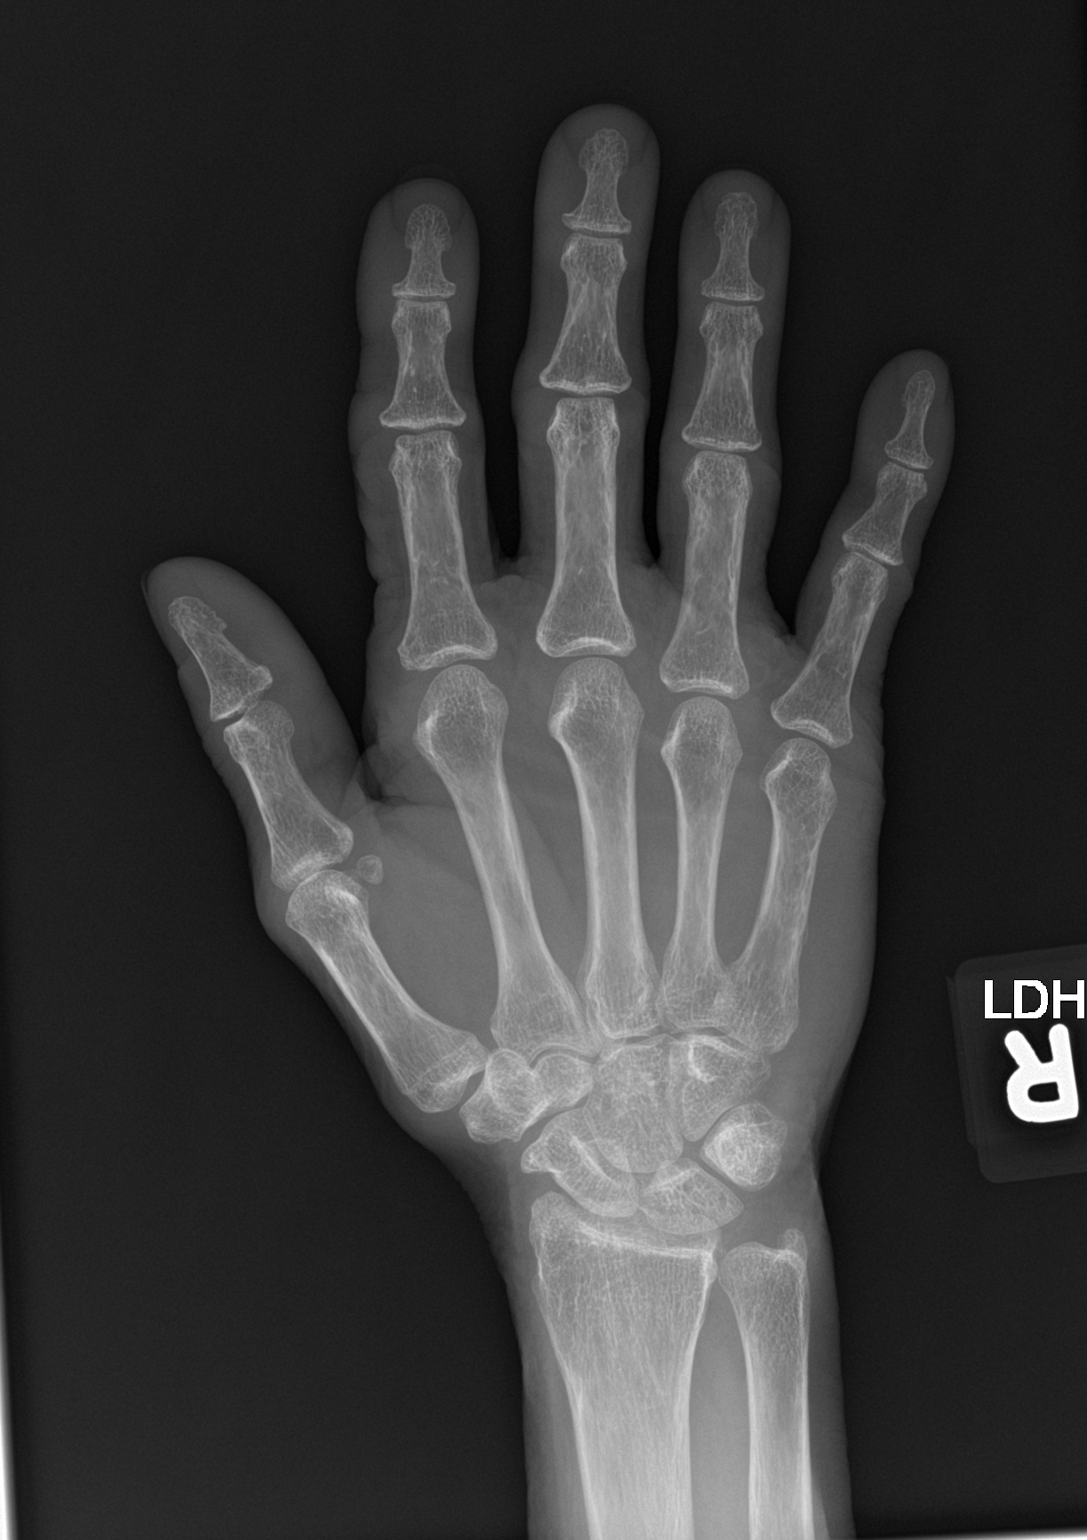
[im 2/3]
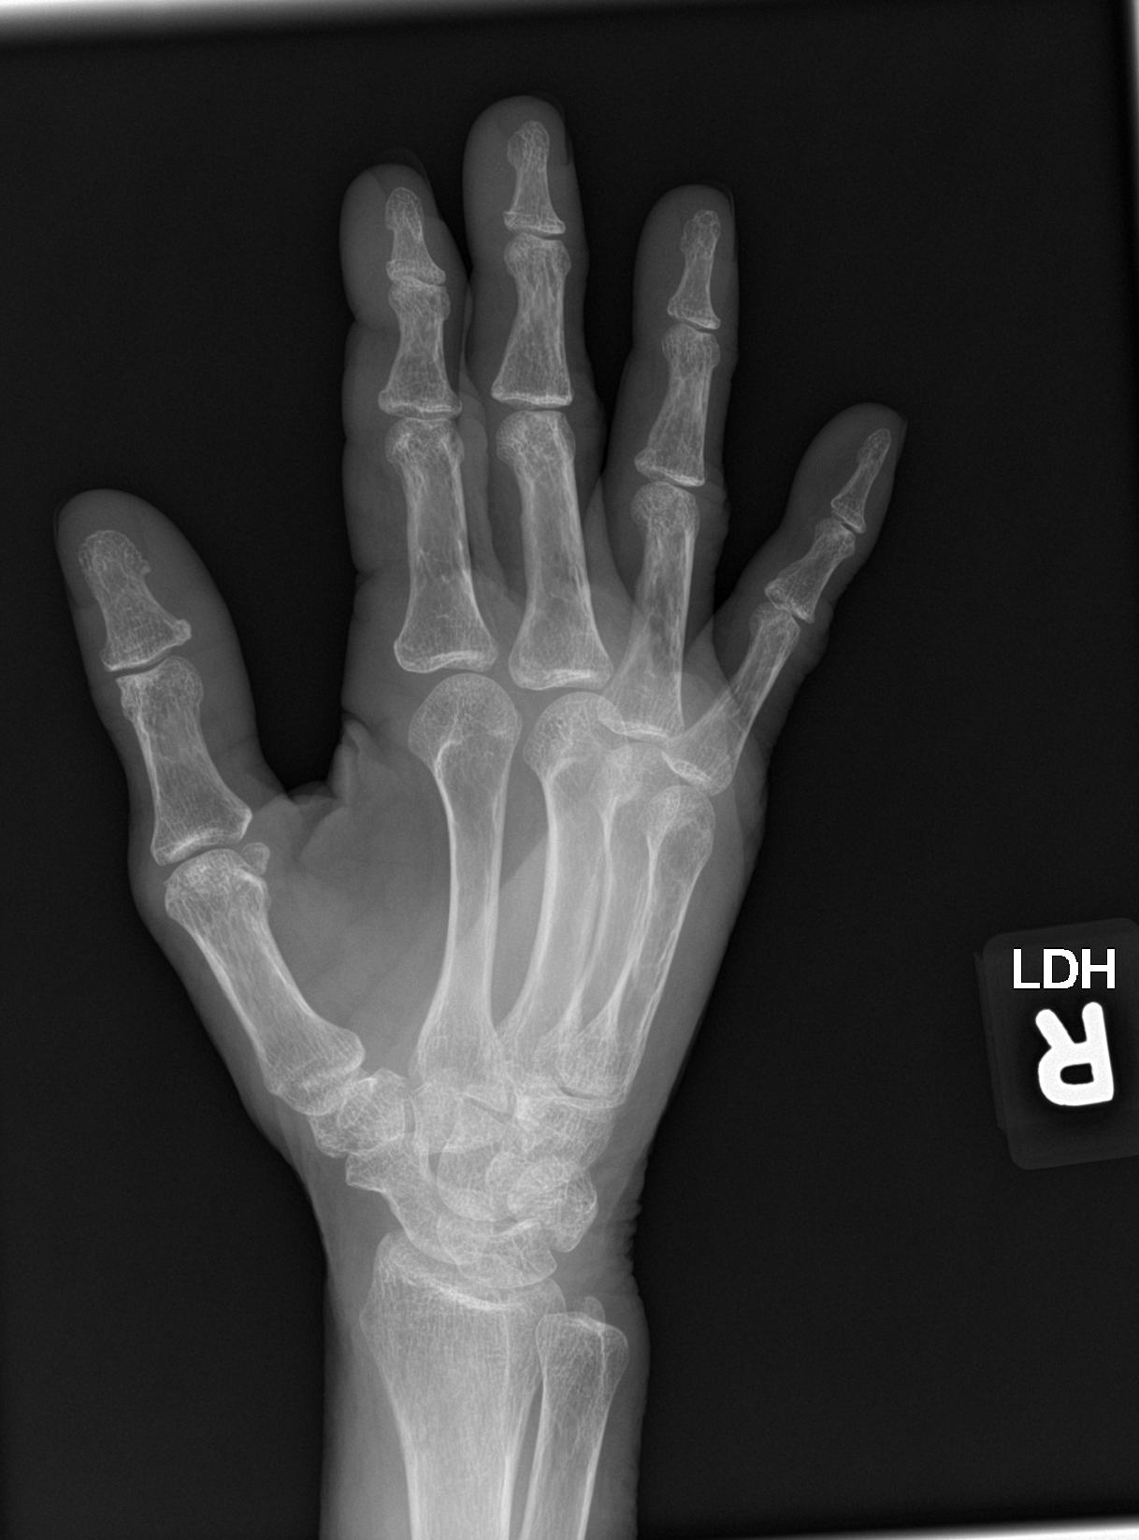
[im 3/3]
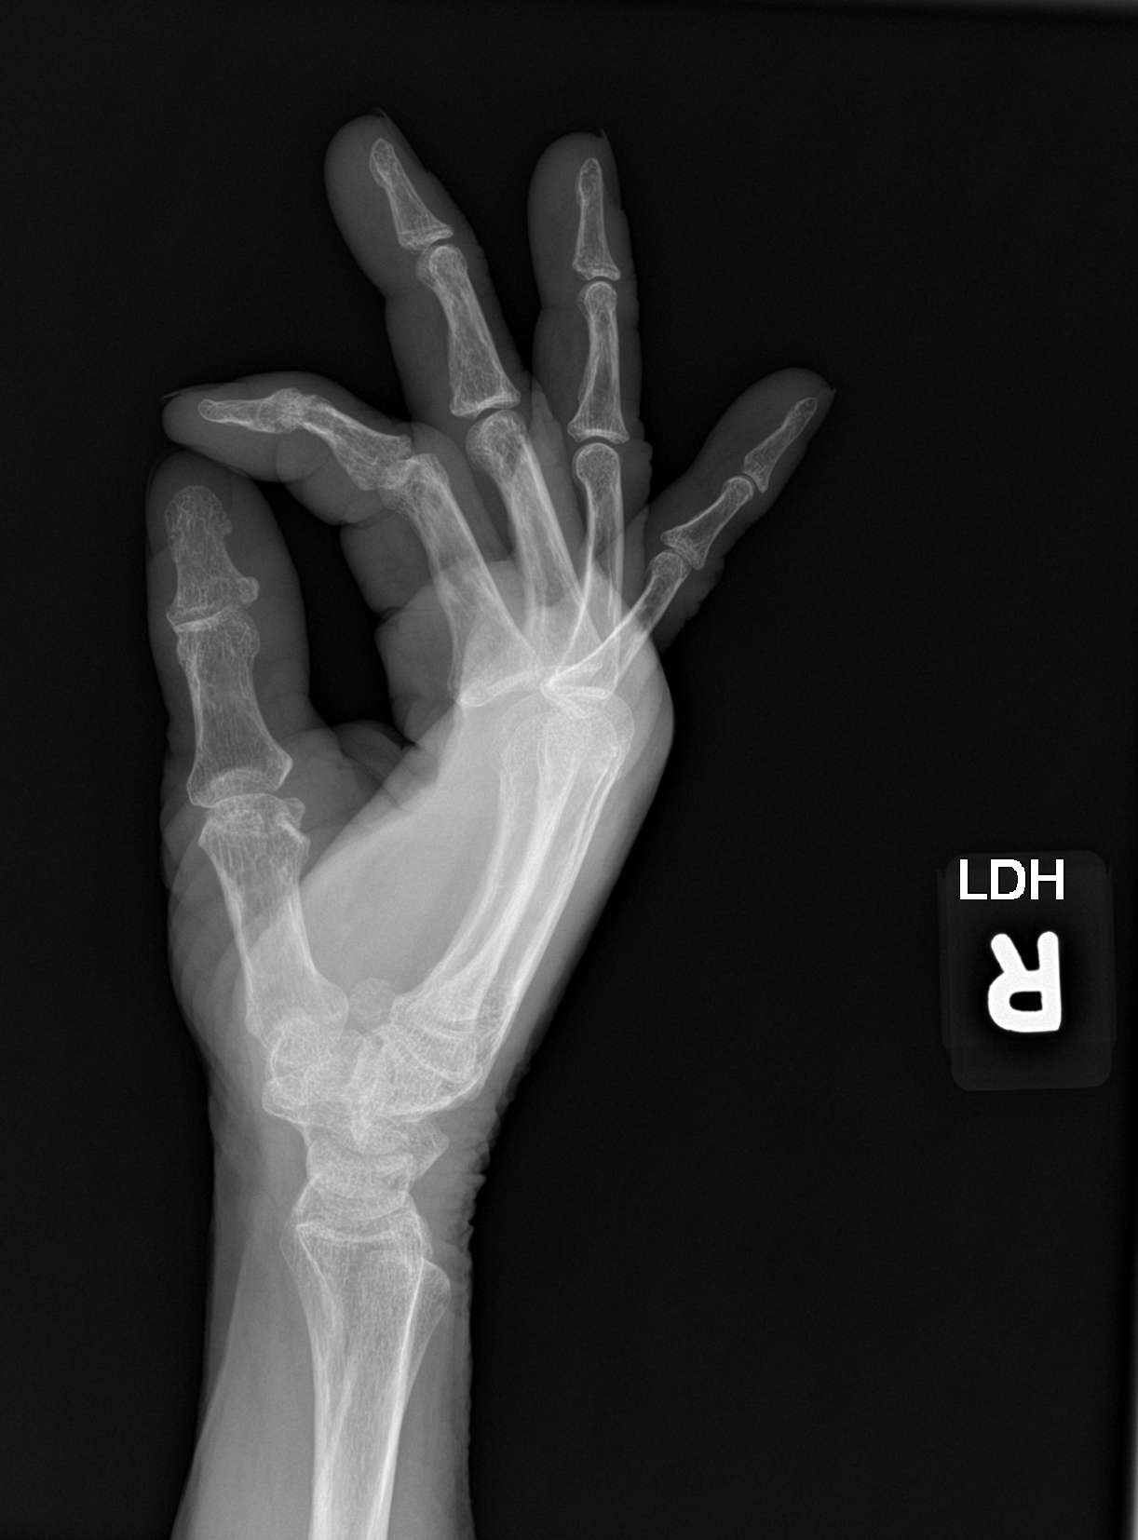

[3 of 3 positions shown; findings below may reference images not displayed]

FINDINGS: Three views of the right hand submitted. No acute fracture or
subluxation. Mild soft tissue swelling noted dorsal metacarpal
region. Mild degenerative changes distal interphalangeal joints.
Mild narrowing of radiocarpal joint space.
IMPRESSION: No acute fracture or subluxation.  Mild degenerative changes.

## 2016-10-01 ENCOUNTER — Telehealth: Payer: Self-pay | Admitting: Internal Medicine

## 2016-10-01 MED ORDER — CLONAZEPAM 0.5 MG PO TABS: ORAL_TABLET | ORAL | 0 refills | Status: DC

## 2016-10-01 NOTE — Telephone Encounter (Signed)
Pt called and is requesting a refill on clonazePAM (KLONOPIN) 0.5 MG tablet. Thank you!  [place], [place].  Call pt @ [phone]

## 2016-10-01 NOTE — Telephone Encounter (Signed)
Courtesy refill.  Needs to be seen every 3 months for refills on controlled substances

## 2016-10-01 NOTE — Telephone Encounter (Signed)
Last OV 04/15/16 with PCP ok to fill?

## 2016-10-02 IMAGING — XA IR ANGIO VETEBRAL SEL VERTEBRAL BILAT MOD SED
1 series · 13 of 24 positions shown · IV contrast (IODINE)
Comparison: none

CLINICAL DATA: Vertebrobasilar insufficiency.

[Series 300: neuro · 13 of 164 slices shown]
[im 1/164]
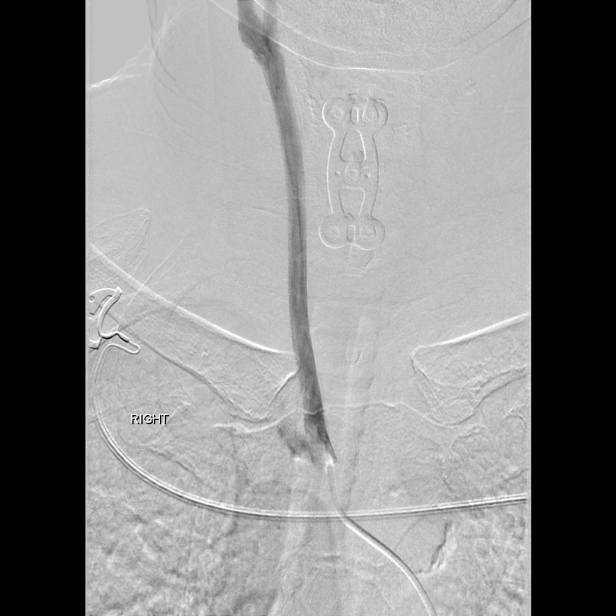
[im 15/164]
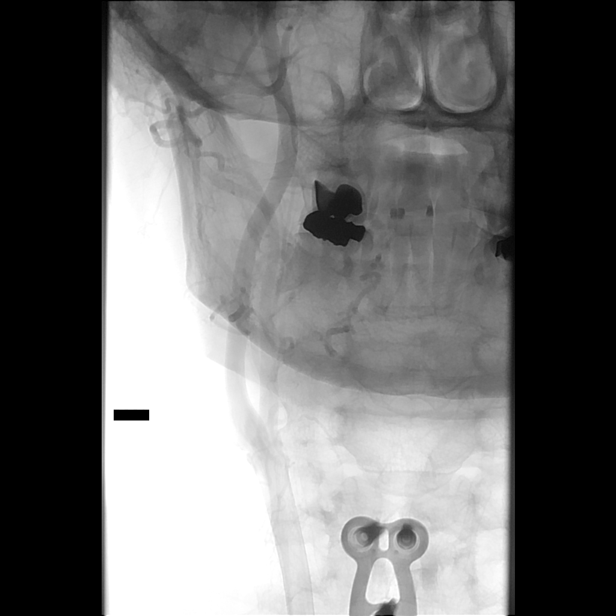
[im 29/164]
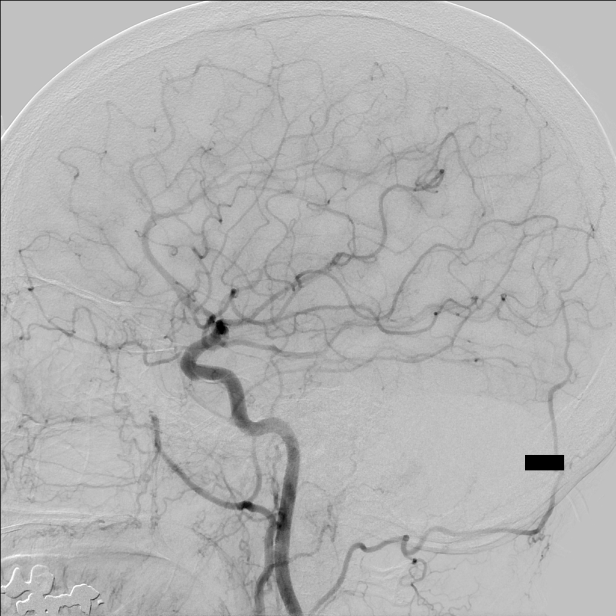
[im 43/164]
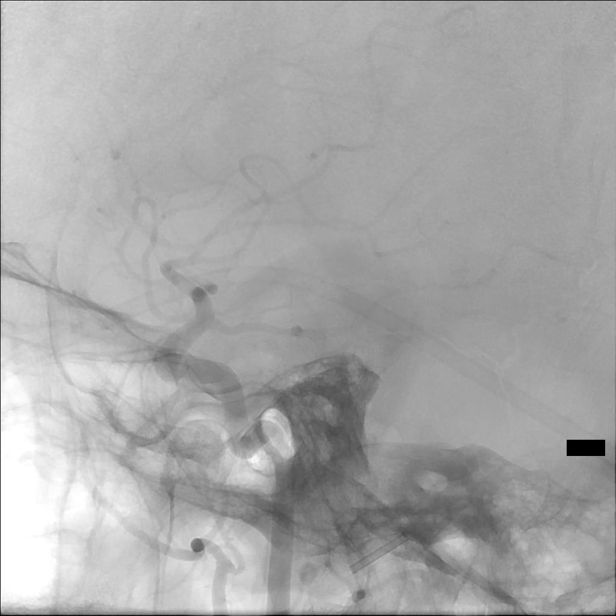
[im 57/164]
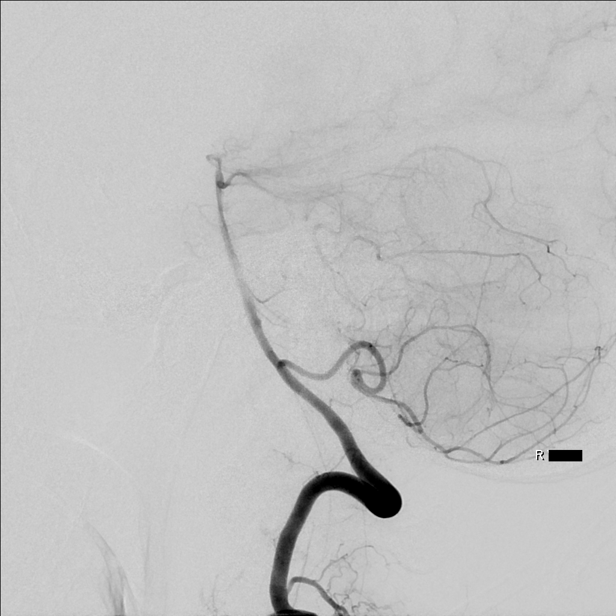
[im 71/164]
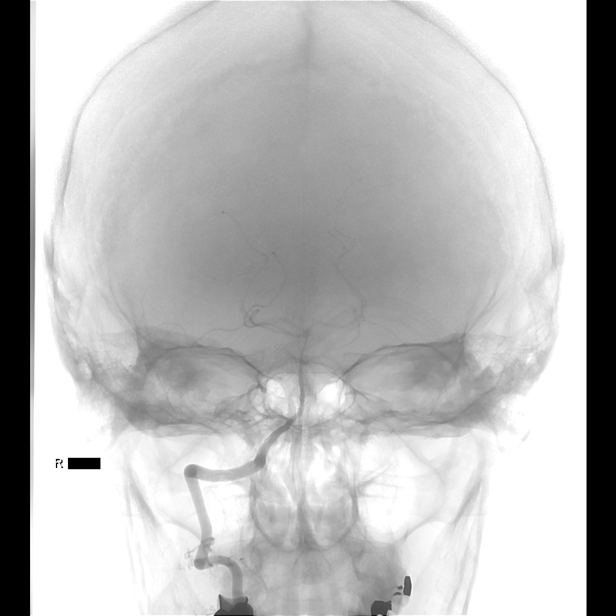
[im 86/164]
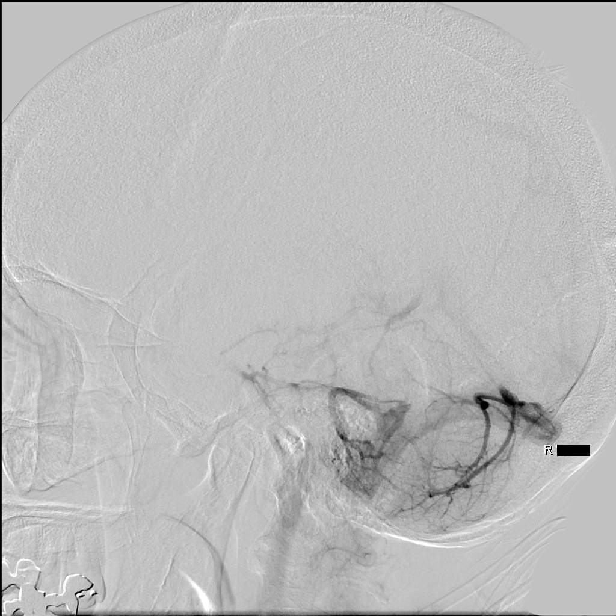
[im 93/164]
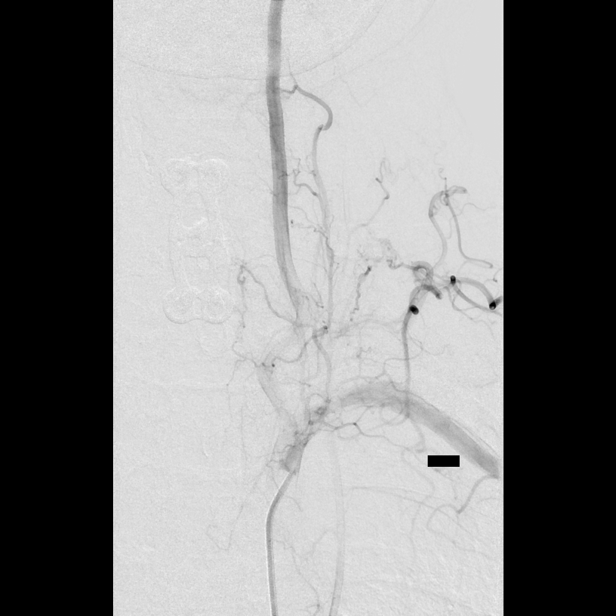
[im 107/164]
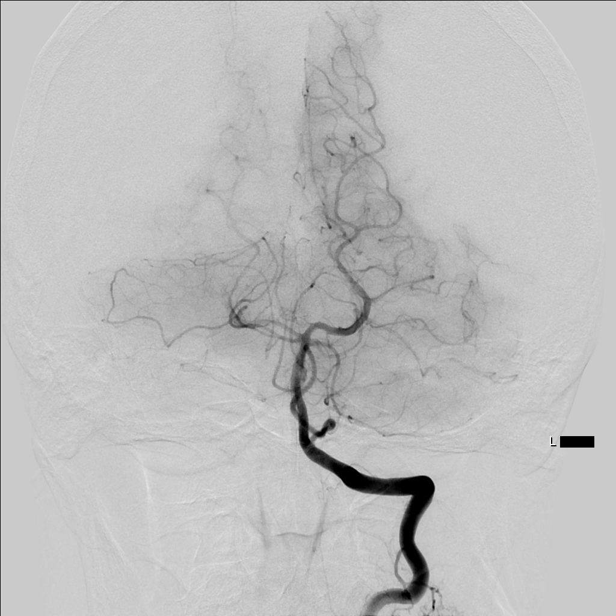
[im 121/164]
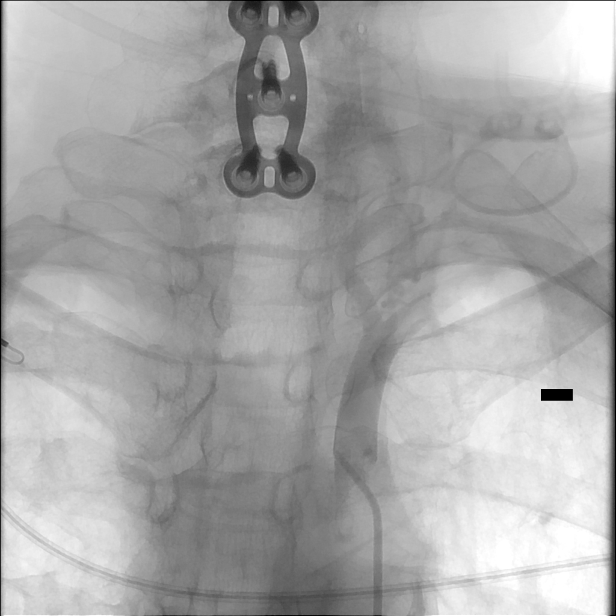
[im 135/164]
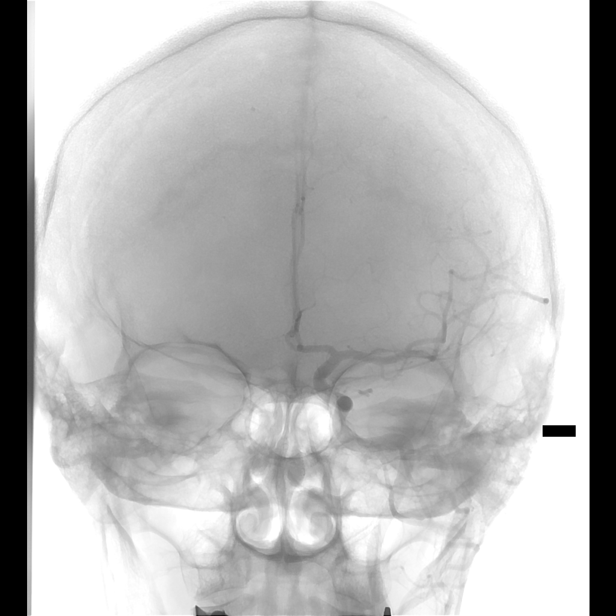
[im 149/164]
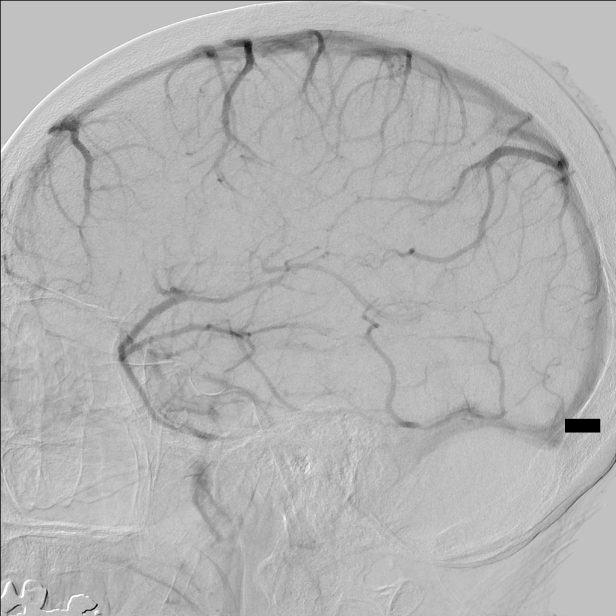
[im 164/164]
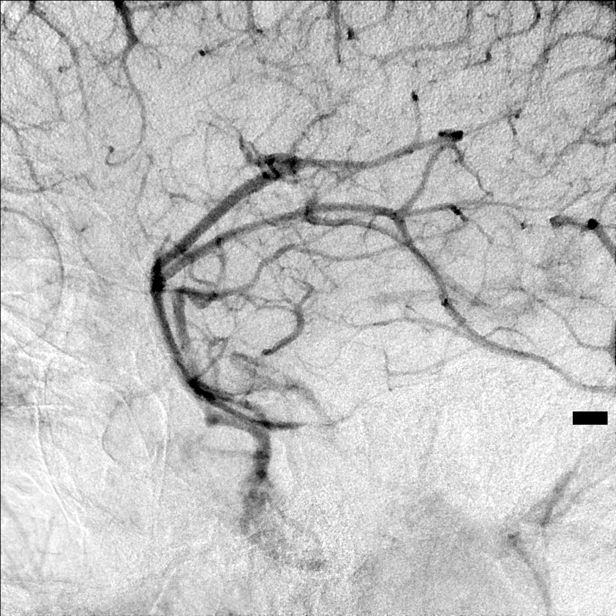

[13 of 24 positions shown; findings below may reference images not displayed]

EXAM:
BILATERAL COMMON CAROTID AND INNOMINATE ANGIOGRAPHY AND BILATERAL
VERTEBRAL ARTERY ANGIOGRAMS

PROCEDURE:
Contrast: 60 mL OMNIPAQUE IOHEXOL 300 MG/ML  SOLN

Anesthesia/Sedation:  Conscious sedation.

Medications: Versed 1 mg IV.  Fentanyl 50 mcg IV.

Following a full explanation of the procedure along with the
potential associated complications, an informed witnessed consent
was obtained.

The right groin was prepped and draped in the usual sterile fashion.
Thereafter using modified Seldinger technique, transfemoral access
into the right common femoral artery was obtained without
difficulty. Over a 0.035 inch guidewire, a 5 French Pinnacle sheath
was inserted. Through this, and also over 0.035 inch guidewire, a 5
French JB1 catheter was advanced to the aortic arch region and
selectively positioned in the right common carotid artery, the right
vertebral artery, the left common carotid artery and the left
vertebral artery.

There were no acute complications. The patient tolerated the
procedure well.
FINDINGS: The right common carotid arteriogram demonstrates the right external
carotid artery and its major branches to be widely patent.

The right internal carotid artery at the bulb to the cranial skull
base opacifies normally.

The petrous, cavernous and the supraclinoid segments are widely
patent.

The right middle cerebral artery and the right anterior cerebral
artery opacify normally into the capillary and venous phases.

A right posterior communicating artery is seen opacifying the right
posterior cerebral artery distribution.

The right vertebral artery origin is normal.

The vessel is seen to opacify normally to the cranial skull base.
There is normal opacification of the right vertebrobasilar junction
and the right posterior inferior cerebral artery.

The opacified portions of the basilar artery, the posterior cerebral
arteries, the superior cerebellar arteries and the anterior inferior
cerebellar arteries is normal into the capillary and venous phases.
Non-opacified blood is seen in the basilar artery from the
contralateral vertebral artery.

The left vertebral artery origin is normal.

The vessel is seen to opacify normally to the cranial skull base.
Normal opacification is seen of the left vertebrobasilar junction
and the left posterior inferior cerebellar artery. The basilar
artery, the posterior cerebral arteries, the superior cerebellar
arteries and the anterior inferior cerebellar arteries opacify
normally into the capillary and venous phases.

The left common carotid arteriogram demonstrates the left external
carotid artery and its major branches to be widely patent.

The left internal carotid artery at the bulb to the cranial skull
base also opacifies normally.

The petrous, cavernous and supraclinoid segments are widely patent.

A small infundibulum is seen at the origin of the left posterior
communicating artery.

The left middle cerebral artery and the left anterior cerebral
artery opacify normally into the capillary and venous phases.

Cross opacification of the right anterior cerebral artery A2 segment
is seen via the anterior communicating artery from the left internal
carotid artery injection.
IMPRESSION: Angiographically no evidence of occlusions, stenoses, dissections,
aneurysms, or of arteriovenous shunting.

The venous outflow is within normal limits.

## 2016-10-02 NOTE — Telephone Encounter (Signed)
Ppt scheduled as requested,

## 2016-10-21 ENCOUNTER — Ambulatory Visit (INDEPENDENT_AMBULATORY_CARE_PROVIDER_SITE_OTHER): Payer: Medicare Other | Admitting: Internal Medicine

## 2016-10-21 ENCOUNTER — Encounter: Payer: Self-pay | Admitting: Internal Medicine

## 2016-10-21 VITALS — BP 110/70 | HR 89 | Temp 98.1°F | Resp 12 | Ht 61.0 in | Wt 105.8 lb

## 2016-10-21 DIAGNOSIS — E7800 Pure hypercholesterolemia, unspecified: Secondary | ICD-10-CM | POA: Diagnosis not present

## 2016-10-21 DIAGNOSIS — Z720 Tobacco use: Secondary | ICD-10-CM

## 2016-10-21 DIAGNOSIS — R5383 Other fatigue: Secondary | ICD-10-CM | POA: Diagnosis not present

## 2016-10-21 DIAGNOSIS — R748 Abnormal levels of other serum enzymes: Secondary | ICD-10-CM

## 2016-10-21 DIAGNOSIS — M5430 Sciatica, unspecified side: Secondary | ICD-10-CM

## 2016-10-21 DIAGNOSIS — E538 Deficiency of other specified B group vitamins: Secondary | ICD-10-CM | POA: Diagnosis not present

## 2016-10-21 DIAGNOSIS — D751 Secondary polycythemia: Secondary | ICD-10-CM

## 2016-10-21 DIAGNOSIS — M5431 Sciatica, right side: Secondary | ICD-10-CM

## 2016-10-21 DIAGNOSIS — E559 Vitamin D deficiency, unspecified: Secondary | ICD-10-CM | POA: Diagnosis not present

## 2016-10-21 DIAGNOSIS — R05 Cough: Secondary | ICD-10-CM

## 2016-10-21 DIAGNOSIS — R059 Cough, unspecified: Secondary | ICD-10-CM

## 2016-10-21 MED ORDER — CLONAZEPAM 0.5 MG PO TABS: ORAL_TABLET | ORAL | 5 refills | Status: DC

## 2016-10-21 MED ORDER — TRAZODONE HCL 50 MG PO TABS: 50.0000 mg | ORAL_TABLET | Freq: Every day | ORAL | 1 refills | Status: DC

## 2016-10-21 NOTE — Progress Notes (Signed)
Subjective:  Patient ID: Tamara [name], female    DOB: 11/17/1945  Age: 69 y.o. MRN: [id number]  CC: The primary encounter diagnosis was Cough. Diagnoses of Sciatica of right side, B12 deficiency, Fatigue, unspecified type, Pure hypercholesterolemia, Vitamin D deficiency, Tobacco abuse, and Sciatic leg pain were also pertinent to this visit.  HPI Tamara [name] presents for medication refills and follow up on multiple issues:    2) chronic cough since September,. Treated on 9/22 for sinusitis/bronchitis with augmentin  and prednisone taper .  Also used mucinex and other ral OTC meds,  Started  Smoking in October.  1 pack daily  .the cough has not improve e.  Never got the chest x ray,  Feels wet but not productive .  Still  having sinus pain    No purulent  Drainage. no fevers.   .     2) remote history of corrective surgery for GERD (chart suggests she had a Nissen fundoplasty)  But she denies that she had a hiatal hernia. Surgery  In the 90's   3) right hip pain , constant in all positions . Points to her ischial tuberosity and states that it  radiates down  The back of her right leg to the ankle present for  At least 1 month   Burning pain , .  Hard to find a comfortable position in bed.  The pain began several weeks after the fall in October . Pain is severe,  Using tylenol arthritis and aspirin . Max 2  FS aspirin daily .  No change with gabapentin .  previously ordered hip  films were ordered buy not done.   3) B12 deficiency now taking oral B12 1000  Mcg pill weekly .  Stopped the injections 3 months ago.    4) GAD with insomnia. Taking zoloft 100 mg daily, clonazepam twice daily,  pamelor for headache prevention. Trazodone for insomnia.    .   Outpatient Medications Prior to Visit  Medication Sig Dispense Refill  . Cyanocobalamin 1000 MCG SUBL Place 1 tablet (1,000 mcg total) under the tongue daily. 90 tablet 3  . gabapentin (NEURONTIN) 300 MG capsule TAKE 1 CAPSULE BY MOUTH ONCE  DAILY 90 capsule 0  . nortriptyline (PAMELOR) 25 MG capsule TAKE 1 CAPSULE BY MOUTH EACH NIGHT AT BEDTIME. 90 capsule 1  . sertraline (ZOLOFT) 100 MG tablet Take 1 tablet (100 mg total) by mouth daily. 30 tablet 6  . clonazePAM (KLONOPIN) 0.5 MG tablet TAKE 1 TABLET BY MOUTH ONCE DAILY AS NEEDED FOR ANXIETY. 30 tablet 0  . traZODone (DESYREL) 50 MG tablet TAKE 1/2 OR 1 TABLET BY MOUTH AT BEDTIMEAS NEEDED FOR SLEEP. 90 tablet 1  . acyclovir (ZOVIRAX) 400 MG tablet Take 1 tablet (400 mg total) by mouth 3 (three) times daily. (Patient not taking: Reported on 10/21/2016) 21 tablet 0  . amoxicillin-clavulanate (AUGMENTIN) 875-125 MG tablet Take 1 tablet by mouth 2 (two) times daily. (Patient not taking: Reported on 10/21/2016) 14 tablet 0  . cyanocobalamin (,VITAMIN B-12,) 1000 MCG/ML injection Use weekly for b12 deficiency x 3 weeks, then monthly (Patient not taking: Reported on 10/21/2016) 10 mL 2  . ergocalciferol (DRISDOL) 50000 UNITS capsule Take 1 capsule (50,000 Units total) by mouth once a week. (Patient not taking: Reported on 10/21/2016) 12 capsule 0  . ergocalciferol (DRISDOL) 50000 units capsule Take 1 capsule (50,000 Units total) by mouth once a week. (Patient not taking: Reported on 10/21/2016) 12 capsule 3  .  gabapentin (NEURONTIN) 100 MG capsule Take 300 mg by mouth 2 (two) times daily. Reported on 04/15/2016    . predniSONE (DELTASONE) 20 MG tablet Take 2 tablets (40 mg total) by mouth daily with breakfast. (Patient not taking: Reported on 10/21/2016) 10 tablet 0  . Syringe, Disposable, 3 ML MISC For use with B12 injections weekly/monthly (Patient not taking: Reported on 10/21/2016) 25 each 0   No facility-administered medications prior to visit.     Review of Systems;  Patient denies headache, fevers, malaise, unintentional weight loss, skin rash, eye pain, sinus congestion and sinus pain, sore throat, dysphagia,  hemoptysis , cough, dyspnea, wheezing, chest pain, palpitations,  orthopnea, edema, abdominal pain, nausea, melena, diarrhea, constipation, flank pain, dysuria, hematuria, urinary  Frequency, nocturia, numbness, tingling, seizures,  Focal weakness, Loss of consciousness,  Tremor, insomnia, depression, anxiety, and suicidal ideation.      Objective:  BP 110/70   Pulse 89   Temp 98.1 F (36.7 C) (Oral)   Resp 12   Ht 5' 1"  (1.549 m)   Wt 105 lb 12 oz (48 kg)   SpO2 97%   BMI 19.98 kg/m   BP Readings from Last 3 Encounters:  10/21/16 110/70  08/26/16 116/72  08/02/16 112/64    Wt Readings from Last 3 Encounters:  10/21/16 105 lb 12 oz (48 kg)  08/26/16 104 lb 8 oz (47.4 kg)  08/02/16 104 lb 12.8 oz (47.5 kg)    General appearance: alert, cooperative and appears stated age Ears: normal TM's and external ear canals both ears Throat: lips, mucosa, and tongue normal; teeth and gums normal Neck: no adenopathy, no carotid bruit, supple, symmetrical, trachea midline and thyroid not enlarged, symmetric, no tenderness/mass/nodules Back: symmetric, no curvature. ROM normal. No CVA tenderness. Lungs: clear to auscultation bilaterally Heart: regular rate and rhythm, S1, S2 normal, no murmur, click, rub or gallop Abdomen: soft, non-tender; bowel sounds normal; no masses,  no organomegaly Pulses: 2+ and symmetric Skin: Skin color, texture, turgor normal. No rashes or lesions Lymph nodes: Cervical, supraclavicular, and axillary nodes normal.  No results found for: HGBA1C  Lab Results  Component Value Date   CREATININE 0.67 04/15/2016   CREATININE 0.82 08/24/2015   CREATININE 0.77 03/24/2015    Lab Results  Component Value Date   WBC 8.8 04/15/2016   HGB 13.7 04/15/2016   HCT 41.4 04/15/2016   PLT 277.0 04/15/2016   GLUCOSE 104 (H) 04/15/2016   CHOL 193 04/15/2016   TRIG 147.0 04/15/2016   HDL 61.30 04/15/2016   LDLDIRECT 121.1 01/26/2014   LDLCALC 102 (H) 04/15/2016   ALT 16 04/15/2016   AST 19 04/15/2016   NA 141 04/15/2016   K 4.4  04/15/2016   CL 108 04/15/2016   CREATININE 0.67 04/15/2016   BUN 11 04/15/2016   CO2 28 04/15/2016   TSH 1.59 04/15/2016   INR 0.96 03/24/2015    Ct Head Wo Contrast  Result Date: 08/26/2016 CLINICAL DATA:  Loss of consciousness and fell on head. Dizziness and headache. EXAM: CT HEAD WITHOUT CONTRAST TECHNIQUE: Contiguous axial images were obtained from the base of the skull through the vertex without intravenous contrast. COMPARISON:  06/27/2015 FINDINGS: Brain: No evidence for acute hemorrhage, mass lesion, midline shift, hydrocephalus or large infarct. Vascular: No hyperdense vessel or unexpected calcification. Skull: Normal. Negative for fracture or focal lesion. Sinuses/Orbits: No acute finding. Other: None. IMPRESSION: No acute intracranial abnormality. Electronically Signed   By: Markus Daft M.D.   On: 08/26/2016 11:09  Assessment & Plan:   Problem List Items Addressed This Visit    B12 deficiency    Advised to increase oral intake to daily.  Repeat level needed  Lab Results  Component Value Date   VITAMINB12 290 04/15/2016         Relevant Orders   Methylmalonic Acid   Vitamin B12   Cough - Primary    Likely secondary to COPD.  Needs chest x ray    Will treat  for GERD and PND.  If no improvement refer for pulmonary testing.       Relevant Orders   DG Chest 2 View   Sedimentation rate   Sciatic leg pain    Right leg,  With normal hip exam.  Plain films of lumbar spine ordered      Relevant Medications   traZODone (DESYREL) 50 MG tablet   clonazePAM (KLONOPIN) 0.5 MG tablet   Other Relevant Orders   DG HIP UNILAT WITH PELVIS 2-3 VIEWS RIGHT   Tobacco abuse    She has resumed smoking  Despite the effects on her general health  Smoking cessation instruction/counseling given:  Spent 3 minutes discussing risk of continued tobacco abuse, including but not limited to CAD, PAD, hypertension, and CA.  She is not interested in pharmacotherapy at this time.       Vitamin D deficiency   Relevant Orders   VITAMIN D 25 Hydroxy (Vit-D Deficiency, Fractures)    Other Visit Diagnoses    Sciatica of right side       Relevant Medications   traZODone (DESYREL) 50 MG tablet   clonazePAM (KLONOPIN) 0.5 MG tablet   Other Relevant Orders   DG Lumbar Spine Complete   DG HIP UNILAT WITH PELVIS 2-3 VIEWS RIGHT   Fatigue, unspecified type       Relevant Orders   Comprehensive metabolic panel   CBC with Differential/Platelet   TSH   Pure hypercholesterolemia       Relevant Orders   Lipid panel      I have discontinued Ms. Labreck's acyclovir, ergocalciferol, ergocalciferol, Syringe (Disposable), amoxicillin-clavulanate, and predniSONE. I have also changed her traZODone. Additionally, I am having her maintain her Cyanocobalamin, sertraline, nortriptyline, gabapentin, and clonazePAM.  Meds ordered this encounter  Medications  . traZODone (DESYREL) 50 MG tablet    Sig: Take 1 tablet (50 mg total) by mouth at bedtime.    Dispense:  90 tablet    Refill:  1  . clonazePAM (KLONOPIN) 0.5 MG tablet    Sig: TAKE 1 TABLET BY MOUTH ONCE DAILY AS NEEDED FOR ANXIETY.    Dispense:  30 tablet    Refill:  5    Medications Discontinued During This Encounter  Medication Reason  . gabapentin (NEURONTIN) 100 MG capsule Change in therapy  . acyclovir (ZOVIRAX) 400 MG tablet   . amoxicillin-clavulanate (AUGMENTIN) 875-125 MG tablet   . cyanocobalamin (,VITAMIN B-12,) 1000 MCG/ML injection   . ergocalciferol (DRISDOL) 50000 UNITS capsule   . ergocalciferol (DRISDOL) 50000 units capsule   . predniSONE (DELTASONE) 20 MG tablet   . Syringe, Disposable, 3 ML MISC   . traZODone (DESYREL) 50 MG tablet Reorder  . clonazePAM (KLONOPIN) 0.5 MG tablet Reorder    Follow-up: No Follow-up on file.   Crecencio Mc, MD

## 2016-10-21 NOTE — Progress Notes (Signed)
Pre-visit discussion using our clinic review tool. No additional management support is needed unless otherwise documented below in the visit note.  

## 2016-10-21 NOTE — Patient Instructions (Signed)
1) Please return this week for x rays of your lumbar sp.ine   2) Your cough may be coming from reflux, allergies, COPD  or post nasal drip (PND).  PND and allergies can be treated with benadryl at bedtime and either Allegra, Zyrtec or  claritin during the day  Benadryl is the most effective for drying you up but it is also the most sedating,  So try taking it at night and use one of the others in the daytime  I am adding  PPI (proton pump inhibitor) to treat reflux: omeprazole one tablet daily ,  I recommend using  simply Saline to flush your sinuses twice daily when you have congestion to prevent sinus infections   Chest x ray this week

## 2016-10-22 DIAGNOSIS — M5430 Sciatica, unspecified side: Secondary | ICD-10-CM | POA: Insufficient documentation

## 2016-10-22 LAB — CBC WITH DIFFERENTIAL/PLATELET
BASOS ABS: 0.1 10*3/uL (ref 0.0–0.1)
Basophils Relative: 0.6 % (ref 0.0–3.0)
EOS PCT: 1.1 % (ref 0.0–5.0)
Eosinophils Absolute: 0.1 10*3/uL (ref 0.0–0.7)
HCT: 47.4 % — ABNORMAL HIGH (ref 36.0–46.0)
HEMOGLOBIN: 16 g/dL — AB (ref 12.0–15.0)
LYMPHS ABS: 5.1 10*3/uL — AB (ref 0.7–4.0)
Lymphocytes Relative: 47.3 % — ABNORMAL HIGH (ref 12.0–46.0)
MCHC: 33.8 g/dL (ref 30.0–36.0)
MCV: 102.7 fl — ABNORMAL HIGH (ref 78.0–100.0)
MONO ABS: 0.9 10*3/uL (ref 0.1–1.0)
Monocytes Relative: 8.5 % (ref 3.0–12.0)
NEUTROS PCT: 42.5 % — AB (ref 43.0–77.0)
Neutro Abs: 4.6 10*3/uL (ref 1.4–7.7)
Platelets: 358 10*3/uL (ref 150.0–400.0)
RBC: 4.61 Mil/uL (ref 3.87–5.11)
RDW: 13.5 % (ref 11.5–15.5)
WBC: 10.7 10*3/uL — AB (ref 4.0–10.5)

## 2016-10-22 LAB — COMPREHENSIVE METABOLIC PANEL
ALBUMIN: 4.9 g/dL (ref 3.5–5.2)
ALK PHOS: 51 U/L (ref 39–117)
ALT: 41 U/L — ABNORMAL HIGH (ref 0–35)
AST: 30 U/L (ref 0–37)
BUN: 11 mg/dL (ref 6–23)
CHLORIDE: 105 meq/L (ref 96–112)
CO2: 27 mEq/L (ref 19–32)
CREATININE: 0.75 mg/dL (ref 0.40–1.20)
Calcium: 10.3 mg/dL (ref 8.4–10.5)
GFR: 81.44 mL/min (ref >60.00)
GLUCOSE: 98 mg/dL (ref 70–99)
Potassium: 4.1 mEq/L (ref 3.5–5.1)
SODIUM: 142 meq/L (ref 135–145)
Total Bilirubin: 0.4 mg/dL (ref 0.2–1.2)
Total Protein: 7.3 g/dL (ref 6.0–8.3)

## 2016-10-22 LAB — SEDIMENTATION RATE: SED RATE: 2 mm/h (ref 0–30)

## 2016-10-22 LAB — VITAMIN D 25 HYDROXY (VIT D DEFICIENCY, FRACTURES): VITD: 32.09 ng/mL (ref 30.00–100.00)

## 2016-10-22 LAB — LIPID PANEL
CHOL/HDL RATIO: 3
Cholesterol: 205 mg/dL — ABNORMAL HIGH (ref 0–200)
HDL: 67.6 mg/dL (ref >39.00)
LDL Cholesterol: 115 mg/dL — ABNORMAL HIGH (ref 0–99)
NONHDL: 137.86
Triglycerides: 115 mg/dL (ref 0.0–149.0)
VLDL: 23 mg/dL (ref 0.0–40.0)

## 2016-10-22 LAB — TSH: TSH: 2.44 u[IU]/mL (ref 0.35–4.50)

## 2016-10-22 LAB — VITAMIN B12: VITAMIN B 12: 666 pg/mL (ref 211–911)

## 2016-10-22 NOTE — Assessment & Plan Note (Signed)
Right leg,  With normal hip exam.  Plain films of lumbar spine ordered

## 2016-10-22 NOTE — Assessment & Plan Note (Signed)
She has resumed smoking  Despite the effects on her general health  Smoking cessation instruction/counseling given:  Spent 3 minutes discussing risk of continued tobacco abuse, including but not limited to CAD, PAD, hypertension, and CA.  She is not interested in pharmacotherapy at this time.

## 2016-10-22 NOTE — Assessment & Plan Note (Signed)
Advised to increase oral intake to daily.  Repeat level needed  Lab Results  Component Value Date   VITAMINB12 290 04/15/2016

## 2016-10-22 NOTE — Assessment & Plan Note (Signed)
Likely secondary to COPD.  Needs chest x ray    Will treat  for GERD and PND.  If no improvement refer for pulmonary testing.

## 2016-10-23 ENCOUNTER — Ambulatory Visit: Payer: Medicare Other

## 2016-10-23 ENCOUNTER — Other Ambulatory Visit: Payer: Self-pay

## 2016-10-23 ENCOUNTER — Ambulatory Visit (HOSPITAL_COMMUNITY)
Admission: RE | Admit: 2016-10-23 | Discharge: 2016-10-23 | Disposition: A | Discharge status: Home / Self Care | Payer: Medicare Other | Source: Ambulatory Visit | Attending: Internal Medicine | Admitting: Internal Medicine

## 2016-10-23 ENCOUNTER — Ambulatory Visit (INDEPENDENT_AMBULATORY_CARE_PROVIDER_SITE_OTHER): Payer: Medicare Other

## 2016-10-23 DIAGNOSIS — M5431 Sciatica, right side: Secondary | ICD-10-CM

## 2016-10-23 DIAGNOSIS — R05 Cough: Secondary | ICD-10-CM

## 2016-10-23 DIAGNOSIS — J449 Chronic obstructive pulmonary disease, unspecified: Secondary | ICD-10-CM | POA: Diagnosis not present

## 2016-10-23 DIAGNOSIS — R059 Cough, unspecified: Secondary | ICD-10-CM

## 2016-10-23 DIAGNOSIS — M5136 Other intervertebral disc degeneration, lumbar region: Secondary | ICD-10-CM | POA: Diagnosis not present

## 2016-10-23 DIAGNOSIS — M5442 Lumbago with sciatica, left side: Secondary | ICD-10-CM | POA: Diagnosis not present

## 2016-10-23 DIAGNOSIS — M5430 Sciatica, unspecified side: Secondary | ICD-10-CM

## 2016-10-24 ENCOUNTER — Encounter: Payer: Self-pay | Admitting: Internal Medicine

## 2016-10-24 DIAGNOSIS — D751 Secondary polycythemia: Secondary | ICD-10-CM | POA: Insufficient documentation

## 2016-10-24 LAB — METHYLMALONIC ACID, SERUM: METHYLMALONIC ACID, QUANT: 222 nmol/L (ref 87–318)

## 2016-10-24 NOTE — Addendum Note (Signed)
Addended by: Crecencio Mc on: 10/24/2016 03:01 PM   Modules accepted: Orders

## 2016-10-25 ENCOUNTER — Encounter: Payer: Self-pay | Admitting: Internal Medicine

## 2016-10-25 DIAGNOSIS — M5386 Other specified dorsopathies, lumbar region: Secondary | ICD-10-CM

## 2016-10-25 NOTE — Telephone Encounter (Signed)
Patient would like to schedule MRI  I have scheduled lab for next month, patient aware.

## 2016-10-27 MED ORDER — VARENICLINE TARTRATE 0.5 MG X 11 & 1 MG X 42 PO MISC: ORAL | 0 refills | Status: DC

## 2016-10-27 NOTE — Telephone Encounter (Signed)
MRI ordered, chantix starting pak ordered from home, may need printing out .  Needs to start it one week prior to elected quit date  And continue it for a minimum of 3 months,  Will need to call prior to end of month to get the refills

## 2016-10-29 ENCOUNTER — Encounter: Payer: Self-pay | Admitting: Internal Medicine

## 2016-10-29 ENCOUNTER — Telehealth: Payer: Self-pay | Admitting: Internal Medicine

## 2016-10-29 MED ORDER — VARENICLINE TARTRATE 0.5 MG X 11 & 1 MG X 42 PO MISC: ORAL | 0 refills | Status: DC

## 2016-10-29 NOTE — Telephone Encounter (Signed)
Script printed waiting on MD signature.

## 2016-10-29 NOTE — Telephone Encounter (Signed)
Script faxed to [facility].

## 2016-10-29 NOTE — Telephone Encounter (Signed)
Pharmacy called and is requesting a rx for Chantix. Pt at pharmacy now. Please advise, thank you!  [place], [place].

## 2016-10-30 ENCOUNTER — Encounter: Payer: Self-pay | Admitting: Internal Medicine

## 2016-10-30 NOTE — Telephone Encounter (Signed)
Chantix to expensive for patient she would like to try something else .

## 2016-10-31 ENCOUNTER — Other Ambulatory Visit: Payer: Self-pay | Admitting: Internal Medicine

## 2016-10-31 DIAGNOSIS — Z720 Tobacco use: Secondary | ICD-10-CM

## 2016-10-31 MED ORDER — NICOTINE 21 MG/24HR TD PT24: 21.0000 mg | MEDICATED_PATCH | Freq: Every day | TRANSDERMAL | 0 refills | Status: DC

## 2016-11-07 ENCOUNTER — Ambulatory Visit
Admission: RE | Admit: 2016-11-07 | Discharge: 2016-11-07 | Disposition: A | Discharge status: Home / Self Care | Payer: Medicare Other | Source: Ambulatory Visit | Attending: Internal Medicine | Admitting: Internal Medicine

## 2016-11-07 DIAGNOSIS — M48062 Spinal stenosis, lumbar region with neurogenic claudication: Secondary | ICD-10-CM

## 2016-11-07 DIAGNOSIS — M539 Dorsopathy, unspecified: Secondary | ICD-10-CM | POA: Diagnosis present

## 2016-11-07 DIAGNOSIS — M48061 Spinal stenosis, lumbar region without neurogenic claudication: Secondary | ICD-10-CM | POA: Diagnosis not present

## 2016-11-07 DIAGNOSIS — M8938 Hypertrophy of bone, other site: Secondary | ICD-10-CM | POA: Diagnosis not present

## 2016-11-07 DIAGNOSIS — M8568 Other cyst of bone, other site: Secondary | ICD-10-CM | POA: Diagnosis not present

## 2016-11-07 DIAGNOSIS — M5386 Other specified dorsopathies, lumbar region: Secondary | ICD-10-CM

## 2016-11-07 DIAGNOSIS — M5126 Other intervertebral disc displacement, lumbar region: Secondary | ICD-10-CM | POA: Insufficient documentation

## 2016-11-07 DIAGNOSIS — N3289 Other specified disorders of bladder: Secondary | ICD-10-CM | POA: Diagnosis not present

## 2016-11-08 ENCOUNTER — Encounter: Payer: Self-pay | Admitting: Internal Medicine

## 2016-11-08 ENCOUNTER — Other Ambulatory Visit: Payer: Self-pay | Admitting: Internal Medicine

## 2016-11-08 DIAGNOSIS — M48062 Spinal stenosis, lumbar region with neurogenic claudication: Secondary | ICD-10-CM

## 2016-11-08 DIAGNOSIS — M5430 Sciatica, unspecified side: Secondary | ICD-10-CM

## 2016-11-08 NOTE — Progress Notes (Signed)
amb ref to

## 2016-11-08 NOTE — Assessment & Plan Note (Signed)
Confirmed by MRI with bilateral L4 nervie root impingement as well.  Recommending neurosurgical referral.

## 2016-11-09 ENCOUNTER — Encounter: Payer: Self-pay | Admitting: Internal Medicine

## 2016-11-12 ENCOUNTER — Other Ambulatory Visit: Payer: Self-pay | Admitting: Internal Medicine

## 2016-11-12 ENCOUNTER — Encounter: Payer: Self-pay | Admitting: Internal Medicine

## 2016-11-12 MED ORDER — PREDNISONE 10 MG PO TABS: ORAL_TABLET | ORAL | 0 refills | Status: DC

## 2016-11-12 MED ORDER — HYDROCODONE-ACETAMINOPHEN 7.5-325 MG PO TABS: 1.0000 | ORAL_TABLET | Freq: Four times a day (QID) | ORAL | 0 refills | Status: DC | PRN

## 2016-11-12 NOTE — Progress Notes (Signed)
red

## 2016-11-13 ENCOUNTER — Encounter: Payer: Self-pay | Admitting: Internal Medicine

## 2016-11-21 DIAGNOSIS — M545 Low back pain: Secondary | ICD-10-CM | POA: Diagnosis not present

## 2016-11-21 DIAGNOSIS — M48061 Spinal stenosis, lumbar region without neurogenic claudication: Secondary | ICD-10-CM | POA: Diagnosis not present

## 2016-11-22 ENCOUNTER — Encounter: Payer: Self-pay | Admitting: Internal Medicine

## 2016-11-25 ENCOUNTER — Other Ambulatory Visit (INDEPENDENT_AMBULATORY_CARE_PROVIDER_SITE_OTHER): Payer: Medicare Other

## 2016-11-25 DIAGNOSIS — D751 Secondary polycythemia: Secondary | ICD-10-CM | POA: Diagnosis not present

## 2016-11-25 DIAGNOSIS — R748 Abnormal levels of other serum enzymes: Secondary | ICD-10-CM | POA: Diagnosis not present

## 2016-11-25 DIAGNOSIS — Z8781 Personal history of (healed) traumatic fracture: Secondary | ICD-10-CM | POA: Diagnosis not present

## 2016-11-25 LAB — CBC WITH DIFFERENTIAL/PLATELET
BASOS PCT: 0.4 % (ref 0.0–3.0)
Basophils Absolute: 0 10*3/uL (ref 0.0–0.1)
EOS ABS: 0.1 10*3/uL (ref 0.0–0.7)
EOS PCT: 1.2 % (ref 0.0–5.0)
HEMATOCRIT: 41 % (ref 36.0–46.0)
HEMOGLOBIN: 13.8 g/dL (ref 12.0–15.0)
Lymphocytes Relative: 44.3 % (ref 12.0–46.0)
Lymphs Abs: 5.4 10*3/uL — ABNORMAL HIGH (ref 0.7–4.0)
MCHC: 33.6 g/dL (ref 30.0–36.0)
MCV: 103.6 fl — ABNORMAL HIGH (ref 78.0–100.0)
MONO ABS: 1.1 10*3/uL — AB (ref 0.1–1.0)
Monocytes Relative: 9.2 % (ref 3.0–12.0)
NEUTROS ABS: 5.5 10*3/uL (ref 1.4–7.7)
Neutrophils Relative %: 44.9 % (ref 43.0–77.0)
PLATELETS: 295 10*3/uL (ref 150.0–400.0)
RBC: 3.96 Mil/uL (ref 3.87–5.11)
RDW: 13.5 % (ref 11.5–15.5)
WBC: 12.2 10*3/uL — AB (ref 4.0–10.5)

## 2016-11-25 LAB — HEPATIC FUNCTION PANEL
ALT: 25 U/L (ref 0–35)
AST: 25 U/L (ref 0–37)
Albumin: 4.2 g/dL (ref 3.5–5.2)
Alkaline Phosphatase: 47 U/L (ref 39–117)
BILIRUBIN DIRECT: 0.1 mg/dL (ref 0.0–0.3)
BILIRUBIN TOTAL: 0.3 mg/dL (ref 0.2–1.2)
Total Protein: 6.1 g/dL (ref 6.0–8.3)

## 2016-11-26 ENCOUNTER — Encounter: Payer: Self-pay | Admitting: Internal Medicine

## 2016-12-02 ENCOUNTER — Other Ambulatory Visit: Payer: Self-pay | Admitting: Internal Medicine

## 2016-12-12 DIAGNOSIS — Z22322 Carrier or suspected carrier of Methicillin resistant Staphylococcus aureus: Secondary | ICD-10-CM

## 2016-12-12 HISTORY — DX: Carrier or suspected carrier of methicillin resistant Staphylococcus aureus: Z22.322

## 2016-12-18 ENCOUNTER — Encounter
Admission: RE | Admit: 2016-12-18 | Discharge: 2016-12-18 | Disposition: A | Discharge status: Home / Self Care | Payer: Medicare Other | Source: Ambulatory Visit | Attending: Neurosurgery | Admitting: Neurosurgery

## 2016-12-18 ENCOUNTER — Ambulatory Visit
Admission: RE | Admit: 2016-12-18 | Discharge: 2016-12-18 | Disposition: A | Discharge status: Home / Self Care | Payer: Medicare Other | Source: Ambulatory Visit | Attending: Neurosurgery | Admitting: Neurosurgery

## 2016-12-18 DIAGNOSIS — Z01818 Encounter for other preprocedural examination: Secondary | ICD-10-CM

## 2016-12-18 DIAGNOSIS — I700 Atherosclerosis of aorta: Secondary | ICD-10-CM | POA: Insufficient documentation

## 2016-12-18 DIAGNOSIS — J449 Chronic obstructive pulmonary disease, unspecified: Secondary | ICD-10-CM | POA: Diagnosis not present

## 2016-12-18 DIAGNOSIS — F17200 Nicotine dependence, unspecified, uncomplicated: Secondary | ICD-10-CM | POA: Diagnosis not present

## 2016-12-18 DIAGNOSIS — Z01810 Encounter for preprocedural cardiovascular examination: Secondary | ICD-10-CM | POA: Diagnosis not present

## 2016-12-18 HISTORY — DX: Major depressive disorder, single episode, unspecified: F32.9

## 2016-12-18 HISTORY — DX: Depression, unspecified: F32.A

## 2016-12-18 LAB — BASIC METABOLIC PANEL
ANION GAP: 7 (ref 5–15)
BUN: 13 mg/dL (ref 6–20)
CALCIUM: 9.3 mg/dL (ref 8.9–10.3)
CO2: 25 mmol/L (ref 22–32)
Chloride: 108 mmol/L (ref 101–111)
Creatinine, Ser: 0.85 mg/dL (ref 0.44–1.00)
GLUCOSE: 83 mg/dL (ref 65–99)
POTASSIUM: 3.9 mmol/L (ref 3.5–5.1)
Sodium: 140 mmol/L (ref 135–145)

## 2016-12-18 LAB — CBC
HEMATOCRIT: 41.7 % (ref 35.0–47.0)
Hemoglobin: 14.5 g/dL (ref 12.0–16.0)
MCH: 35.5 pg — ABNORMAL HIGH (ref 26.0–34.0)
MCHC: 34.8 g/dL (ref 32.0–36.0)
MCV: 102 fL — AB (ref 80.0–100.0)
Platelets: 293 10*3/uL (ref 150–440)
RBC: 4.09 MIL/uL (ref 3.80–5.20)
RDW: 12.9 % (ref 11.5–14.5)
WBC: 11.5 10*3/uL — ABNORMAL HIGH (ref 3.6–11.0)

## 2016-12-18 LAB — TYPE AND SCREEN
ABO/RH(D): O POS
Antibody Screen: NEGATIVE

## 2016-12-18 LAB — URINALYSIS, COMPLETE (UACMP) WITH MICROSCOPIC
Bacteria, UA: NONE SEEN
Bilirubin Urine: NEGATIVE
GLUCOSE, UA: NEGATIVE mg/dL
HGB URINE DIPSTICK: NEGATIVE
Ketones, ur: NEGATIVE mg/dL
LEUKOCYTES UA: NEGATIVE
Nitrite: NEGATIVE
PH: 5 (ref 5.0–8.0)
Protein, ur: NEGATIVE mg/dL
Specific Gravity, Urine: 1.003 — ABNORMAL LOW (ref 1.005–1.030)

## 2016-12-18 LAB — SURGICAL PCR SCREEN
MRSA, PCR: POSITIVE — AB
STAPHYLOCOCCUS AUREUS: POSITIVE — AB

## 2016-12-18 LAB — PROTIME-INR
INR: 0.85
Prothrombin Time: 11.6 seconds (ref 11.4–15.2)

## 2016-12-18 LAB — APTT: APTT: 27 s (ref 24–36)

## 2016-12-18 NOTE — Patient Instructions (Signed)
  Your procedure is scheduled on:  Report to Same Day Surgery 2nd floor medical mall [facility] Entrance-take elevator on left to 2nd floor.  Check in with surgery information desk.) To find out your arrival time please call [phone] between 1PM - 3PM on   Remember: Instructions that are not followed completely may result in serious medical risk, up to and including death, or upon the discretion of your surgeon and anesthesiologist your surgery may need to be rescheduled.    _x___ 1. Do not eat food or drink liquids after midnight. No gum chewing or hard candies.     __x__ 2. No Alcohol for 24 hours before or after surgery.   __x__3. No Smoking for 24 prior to surgery.   ____  4. Bring all medications with you on the day of surgery if instructed.    __x__ 5. Notify your doctor if there is any change in your medical condition     (cold, fever, infections).     Do not wear jewelry, make-up, hairpins, clips or nail polish.  Do not wear lotions, powders, or perfumes. You may wear deodorant.  Do not shave 48 hours prior to surgery. Men may shave face and neck.  Do not bring valuables to the hospital.    [facility] is not responsible for any belongings or valuables.               Contacts, dentures or bridgework may not be worn into surgery.  Leave your suitcase in the car. After surgery it may be brought to your room.  For patients admitted to the hospital, discharge time is determined by your treatment team.   Patients discharged the day of surgery will not be allowed to drive home.  You will need someone to drive you home and stay with you the night of your procedure.    Please read over the following fact sheets that you were given:   [facility] Preparing for Surgery and or MRSA Information   _x___ Take these medicines the morning of surgery with A SIP OF WATER:    1. HYDROcodone-acetaminophen   2.  3.  4.  5.  6.  ____Fleets enema or Magnesium Citrate as  directed.   _x___ Use CHG Soap or sage wipes as directed on instruction sheet   ____ Use inhalers on the day of surgery and bring to hospital day of surgery  ____ Stop metformin 2 days prior to surgery    ____ Take 1/2 of usual insulin dose the night before surgery and none on the morning of           surgery.   _x_ Stop Aspirin, Coumadin, Pllavix ,Eliquis, Effient, or Pradaxa Stopped aspirin yesterday  x__ Stop Anti-inflammatories such as Advil, Aleve, Ibuprofen, Motrin, Naproxen,          Naprosyn, Goodies powders or aspirin products. Ok to take Tylenol.   ____ Stop supplements until after surgery.    ____ Bring C-Pap to the hospital.

## 2016-12-18 NOTE — Pre-Procedure Instructions (Signed)
Narrative   CARDIOLOGY DEPARTMENT Tamara [name], Tamara [name] [id number] [facility] Acct #: [id number] [address] [name], [place] [address] Date: 12/15/2014 10:16 AM  Adult Female Age: 71 yrs  ECHOCARDIOGRAM REPORT Outpatient  KC::KCWC STUDY:Stress EchoTAPE: MD1:TULLO, TERESA  ECHO:Yes DOPPLER:YesFILE:0000-000-000 BP: 99/69 mmHg COLOR:YesCONTRAST:NoMACHINE:PhilipsHeight: 62 in RV BIOPSY:No 3D:No SOUND QLTY:Moderate Weight: 102 lb  MEDIUM:None BSA: 1.4 m2  ___________________________________________________________________________________________ HISTORY:Chest pain  REASON:Assess, LV function  Indication:I20.9 Ischemic chest pain  ___________________________________________________________________________________________ STRESS ECHOCARDIOGRAPHY   Protocol:Treadmill (Modified Bruce) Drugs:None Target Heart Rate:130 bpmMaximum Predicted Heart Rate: 153 bpm  +-------------------+-------------------------+-------------------------+------------+  Stage  Duration (mm:ss) Heart Rate (bpm) BP  +-------------------+-------------------------+-------------------------+------------+ RESTING 86  99/69 +-------------------+-------------------------+-------------------------+------------+  MODIFIED  EXERCISE  5:00 153  / +-------------------+-------------------------+-------------------------+------------+ RECOVERY  7:0699  134/78  +-------------------+-------------------------+-------------------------+------------+  Stress Duration:5:00 mm:ss Max Stress H.R.:153 bpmTarget Heart Rate Achieved: Yes   ___________________________________________________________________________________________ WALL SEGMENT CHANGES  RestStress Anterior Septum Basal:NormalHyperkinetic OTL:[id number]  Apical:NormalHyperkinetic  Anterior Wall Basal:NormalHyperkinetic XMI:[id number]  Apical:NormalHyperkinetic   Lateral Wall Basal:NormalHyperkinetic UGQ:[id number]  Apical:NormalHyperkinetic   Posterior Wall Basal:NormalHyperkinetic ZPH:[id number]  Inferior Wall Basal:NormalHyperkinetic MOL:[id number]  Apical:NormalHyperkinetic  Inferior Septum Basal:NormalHyperkinetic FXJ:[id number]   Resting EF:>55% (Est.) Stress EF: >55% (Est.)   ___________________________________________________________________________________________ ADDITIONAL FINDINGS  Chest Discomfort:None Arrhythmia:None Termination Reason:Fatigue  Adverse Effects:None  Complications:None   ___________________________________________________________________________________________ STRESS ECG RESULTS   ECG  Results:Normal  ___________________________________________________________________________________________  ECHOCARDIOGRAPHIC DESCRIPTIONS  LEFT VENTRICLE Size:Normal  Contraction:Normal  LV Masses:No Masses  MHW:KGSU Dias.FxClass:Normal  RIGHT VENTRICLE Size:Normal Free Wall:Normal  Contraction:Normal RV Masses:No mass  PERICARDIUM  Fluid:No effusion  _______________________________________________________________________________________  DOPPLER ECHO and OTHER SPECIAL PROCEDURES  Aortic:No ARNo AS   Mitral:MILD MRNo MS MV Inflow E Vel=nm*MV Annulus E'Vel=nm* E/E'Ratio=nm*  Tricuspid:MILD TRNo TS 230.0 cm/sec peak TR vel 26.2 mmHg peak RV pressure  Pulmonary:TRIVIAL PR No PS   ___________________________________________________________________________________________  ECHOCARDIOGRAPHIC MEASUREMENTS 2D DIMENSIONS AORTA ValuesNormal RangeMAIN PAValuesNormal Range Annulus:nm* [2.1 - 2.5]PA Main:nm* [1.5 - 2.1] Aorta Sin:nm* [2.7 - 3.3] RIGHT VENTRICLE ST Junction:nm* [2.3 - 2.9]RV Base:nm* [ < 4.2] Asc.Aorta:nm* [2.3 - 3.1] RV Mid:nm* [ < 3.5]  LEFT VENTRICLERV Length:nm* [ < 8.6] LVIDd:3.9 cm[3.9 - 5.3] INFERIOR VENA CAVA LVIDs:2.7 cmMax. IVC:nm* [ <= 2.1]  FS:31.0 %[> 25]Min. IVC:nm* SWT:0.75 cm [0.5 - 0.9] ------------------ PWT:0.75 cm [0.5 - 0.9] nm* - not measured  LEFT  ATRIUM LA Diam:3.0 cm[2.7 - 3.8] LA A4C Area:nm* [ < 20] LA Volume:nm* [22 - 11]  ___________________________________________________________________________________________ INTERPRETATION Normal Stress Echocardiogram NORMAL RIGHT VENTRICULAR SYSTOLIC FUNCTION MILD VALVULAR REGURGITATION (See above) NO VALVULAR STENOSIS NOTED   ___________________________________________________________________________________________ Electronically signed by: MD Serafina Royals on 12/15/2014 11:22 AM Performed By: Johnathan Hausen, RDCS, RVT Ordering Physician: Serafina Royals  ___________________________________________________________________________________________  Status Results Details    Appointment on 11/11/1839 [place]")' href="[email or web address]/">Encounter Summary

## 2016-12-18 NOTE — Pre-Procedure Instructions (Signed)
Faxed positive MRSA and Staph result to Dr Rhea Bleacher office.

## 2016-12-19 ENCOUNTER — Encounter: Payer: Self-pay | Admitting: *Deleted

## 2016-12-20 MED ORDER — CEFAZOLIN IN D5W 1 GM/50ML IV SOLN: 1.0000 g | Freq: Once | INTRAVENOUS | Status: DC

## 2016-12-24 ENCOUNTER — Encounter: Payer: Self-pay | Admitting: Internal Medicine

## 2016-12-25 ENCOUNTER — Ambulatory Visit
Admission: RE | Admit: 2016-12-25 | Discharge: 2016-12-25 | Disposition: A | Discharge status: Home / Self Care | Payer: Medicare Other | Source: Ambulatory Visit | Attending: Neurosurgery | Admitting: Neurosurgery

## 2016-12-25 ENCOUNTER — Ambulatory Visit: Payer: Medicare Other

## 2016-12-25 ENCOUNTER — Encounter: Payer: Self-pay | Admitting: *Deleted

## 2016-12-25 ENCOUNTER — Encounter
Admission: RE | Disposition: A | Discharge status: Home / Self Care | Payer: Self-pay | Source: Ambulatory Visit | Attending: Neurosurgery

## 2016-12-25 ENCOUNTER — Ambulatory Visit: Payer: Medicare Other | Admitting: Anesthesiology

## 2016-12-25 DIAGNOSIS — M810 Age-related osteoporosis without current pathological fracture: Secondary | ICD-10-CM | POA: Diagnosis not present

## 2016-12-25 DIAGNOSIS — F17210 Nicotine dependence, cigarettes, uncomplicated: Secondary | ICD-10-CM | POA: Diagnosis not present

## 2016-12-25 DIAGNOSIS — M5136 Other intervertebral disc degeneration, lumbar region: Secondary | ICD-10-CM | POA: Diagnosis not present

## 2016-12-25 DIAGNOSIS — F329 Major depressive disorder, single episode, unspecified: Secondary | ICD-10-CM | POA: Diagnosis not present

## 2016-12-25 DIAGNOSIS — Z86718 Personal history of other venous thrombosis and embolism: Secondary | ICD-10-CM | POA: Diagnosis not present

## 2016-12-25 DIAGNOSIS — Z7982 Long term (current) use of aspirin: Secondary | ICD-10-CM | POA: Diagnosis not present

## 2016-12-25 DIAGNOSIS — J449 Chronic obstructive pulmonary disease, unspecified: Secondary | ICD-10-CM | POA: Insufficient documentation

## 2016-12-25 DIAGNOSIS — Z882 Allergy status to sulfonamides status: Secondary | ICD-10-CM | POA: Insufficient documentation

## 2016-12-25 DIAGNOSIS — K5090 Crohn's disease, unspecified, without complications: Secondary | ICD-10-CM | POA: Insufficient documentation

## 2016-12-25 DIAGNOSIS — G9619 Other disorders of meninges, not elsewhere classified: Secondary | ICD-10-CM | POA: Insufficient documentation

## 2016-12-25 DIAGNOSIS — M48062 Spinal stenosis, lumbar region with neurogenic claudication: Secondary | ICD-10-CM | POA: Diagnosis not present

## 2016-12-25 DIAGNOSIS — Z419 Encounter for procedure for purposes other than remedying health state, unspecified: Secondary | ICD-10-CM

## 2016-12-25 HISTORY — DX: Chronic obstructive pulmonary disease, unspecified: J44.9

## 2016-12-25 HISTORY — PX: LUMBAR LAMINECTOMY/DECOMPRESSION MICRODISCECTOMY: SHX5026

## 2016-12-25 LAB — ABO/RH: ABO/RH(D): O POS

## 2016-12-25 SURGERY — LUMBAR LAMINECTOMY/DECOMPRESSION MICRODISCECTOMY
Anesthesia: General | Site: Spine Lumbar | Wound class: Clean

## 2016-12-25 MED ORDER — BUPIVACAINE-EPINEPHRINE (PF) 0.5% -1:200000 IJ SOLN
INTRAMUSCULAR | Status: DC | PRN
  Administered 2016-12-25: 10 mL

## 2016-12-25 MED ORDER — LIDOCAINE HCL (CARDIAC) 20 MG/ML IV SOLN
INTRAVENOUS | Status: DC | PRN
  Administered 2016-12-25: 50 mg via INTRAVENOUS

## 2016-12-25 MED ORDER — SUGAMMADEX SODIUM 200 MG/2ML IV SOLN
INTRAVENOUS | Status: DC | PRN
  Administered 2016-12-25: 100 mg via INTRAVENOUS

## 2016-12-25 MED ORDER — BACITRACIN 50000 UNITS IM SOLR
INTRAMUSCULAR | Status: AC
  Filled 2016-12-25: qty 1

## 2016-12-25 MED ORDER — ONDANSETRON HCL 4 MG/2ML IJ SOLN
INTRAMUSCULAR | Status: AC
  Filled 2016-12-25: qty 2

## 2016-12-25 MED ORDER — ROCURONIUM BROMIDE 50 MG/5ML IV SOLN
INTRAVENOUS | Status: AC
  Filled 2016-12-25: qty 1

## 2016-12-25 MED ORDER — DEXAMETHASONE SODIUM PHOSPHATE 10 MG/ML IJ SOLN
INTRAMUSCULAR | Status: DC | PRN
  Administered 2016-12-25: 10 mg via INTRAVENOUS

## 2016-12-25 MED ORDER — BUPIVACAINE LIPOSOME 1.3 % IJ SUSP
INTRAMUSCULAR | Status: DC | PRN
  Administered 2016-12-25: 20 mL

## 2016-12-25 MED ORDER — GELATIN ABSORBABLE 12-7 MM EX MISC
CUTANEOUS | Status: AC
  Filled 2016-12-25: qty 1

## 2016-12-25 MED ORDER — BUPIVACAINE LIPOSOME 1.3 % IJ SUSP
INTRAMUSCULAR | Status: AC
  Filled 2016-12-25: qty 20

## 2016-12-25 MED ORDER — FAMOTIDINE 20 MG PO TABS
20.0000 mg | ORAL_TABLET | Freq: Once | ORAL | Status: AC
  Administered 2016-12-25: 20 mg via ORAL

## 2016-12-25 MED ORDER — METHOCARBAMOL 500 MG PO TABS: 500.0000 mg | ORAL_TABLET | Freq: Four times a day (QID) | ORAL | 0 refills | Status: DC | PRN

## 2016-12-25 MED ORDER — FENTANYL CITRATE (PF) 100 MCG/2ML IJ SOLN
INTRAMUSCULAR | Status: AC
  Filled 2016-12-25: qty 2

## 2016-12-25 MED ORDER — SODIUM CHLORIDE 0.9 % IJ SOLN
INTRAMUSCULAR | Status: AC
  Filled 2016-12-25: qty 10

## 2016-12-25 MED ORDER — FENTANYL CITRATE (PF) 100 MCG/2ML IJ SOLN
INTRAMUSCULAR | Status: DC | PRN
  Administered 2016-12-25: 100 ug via INTRAVENOUS
  Administered 2016-12-25: 50 ug via INTRAVENOUS

## 2016-12-25 MED ORDER — PROPOFOL 10 MG/ML IV BOLUS
INTRAVENOUS | Status: AC
  Filled 2016-12-25: qty 20

## 2016-12-25 MED ORDER — MIDAZOLAM HCL 2 MG/2ML IJ SOLN
INTRAMUSCULAR | Status: DC | PRN
  Administered 2016-12-25: 2 mg via INTRAVENOUS

## 2016-12-25 MED ORDER — PHENYLEPHRINE HCL 10 MG/ML IJ SOLN
INTRAMUSCULAR | Status: AC
  Filled 2016-12-25: qty 1

## 2016-12-25 MED ORDER — ROCURONIUM BROMIDE 100 MG/10ML IV SOLN
INTRAVENOUS | Status: DC | PRN
  Administered 2016-12-25: 30 mg via INTRAVENOUS
  Administered 2016-12-25 (×2): 20 mg via INTRAVENOUS

## 2016-12-25 MED ORDER — SUGAMMADEX SODIUM 200 MG/2ML IV SOLN
INTRAVENOUS | Status: AC
  Filled 2016-12-25: qty 2

## 2016-12-25 MED ORDER — PHENYLEPHRINE HCL 10 MG/ML IJ SOLN
INTRAMUSCULAR | Status: DC | PRN
  Administered 2016-12-25: 200 ug via INTRAVENOUS
  Administered 2016-12-25 (×4): 50 ug via INTRAVENOUS
  Administered 2016-12-25: 100 ug via INTRAVENOUS
  Administered 2016-12-25: 200 ug via INTRAVENOUS
  Administered 2016-12-25: 50 ug via INTRAVENOUS

## 2016-12-25 MED ORDER — MIDAZOLAM HCL 2 MG/2ML IJ SOLN
INTRAMUSCULAR | Status: AC
  Filled 2016-12-25: qty 2

## 2016-12-25 MED ORDER — THROMBIN 5000 UNITS EX SOLR
CUTANEOUS | Status: AC
  Filled 2016-12-25: qty 5000

## 2016-12-25 MED ORDER — LIDOCAINE HCL (PF) 2 % IJ SOLN
INTRAMUSCULAR | Status: AC
  Filled 2016-12-25: qty 2

## 2016-12-25 MED ORDER — PROPOFOL 10 MG/ML IV BOLUS
INTRAVENOUS | Status: DC | PRN
Start: 2016-12-25 — End: 2016-12-25
  Administered 2016-12-25: 140 mg via INTRAVENOUS

## 2016-12-25 MED ORDER — LACTATED RINGERS IV SOLN
INTRAVENOUS | Status: DC
  Administered 2016-12-25: 07:00:00 via INTRAVENOUS

## 2016-12-25 MED ORDER — FAMOTIDINE 20 MG PO TABS
ORAL_TABLET | ORAL | Status: AC
  Filled 2016-12-25: qty 1

## 2016-12-25 MED ORDER — BACITRACIN 50000 UNITS IM SOLR
INTRAMUSCULAR | Status: DC | PRN
  Administered 2016-12-25: 5000 [IU]

## 2016-12-25 MED ORDER — THROMBIN 5000 UNITS EX SOLR
CUTANEOUS | Status: DC | PRN
  Administered 2016-12-25: 5000 [IU] via TOPICAL

## 2016-12-25 MED ORDER — VANCOMYCIN HCL 10 G IV SOLR
1500.0000 mg | Freq: Once | INTRAVENOUS | Status: AC
  Administered 2016-12-25: 1500 mg via INTRAVENOUS
  Filled 2016-12-25: qty 1500

## 2016-12-25 MED ORDER — FENTANYL CITRATE (PF) 100 MCG/2ML IJ SOLN
INTRAMUSCULAR | Status: AC
  Administered 2016-12-25: 25 ug via INTRAVENOUS
  Filled 2016-12-25: qty 2

## 2016-12-25 MED ORDER — BUPIVACAINE-EPINEPHRINE (PF) 0.5% -1:200000 IJ SOLN
INTRAMUSCULAR | Status: AC
  Filled 2016-12-25: qty 30

## 2016-12-25 MED ORDER — ONDANSETRON HCL 4 MG/2ML IJ SOLN: 4.0000 mg | Freq: Once | INTRAMUSCULAR | Status: DC | PRN

## 2016-12-25 MED ORDER — SODIUM CHLORIDE FLUSH 0.9 % IV SOLN
INTRAVENOUS | Status: AC
  Filled 2016-12-25: qty 10

## 2016-12-25 MED ORDER — FENTANYL CITRATE (PF) 100 MCG/2ML IJ SOLN
25.0000 ug | INTRAMUSCULAR | Status: DC | PRN
  Administered 2016-12-25 (×4): 25 ug via INTRAVENOUS

## 2016-12-25 MED ORDER — ONDANSETRON HCL 4 MG/2ML IJ SOLN
INTRAMUSCULAR | Status: DC | PRN
  Administered 2016-12-25: 4 mg via INTRAVENOUS

## 2016-12-25 MED ORDER — METHYLPREDNISOLONE ACETATE 40 MG/ML IJ SUSP
INTRAMUSCULAR | Status: AC
  Filled 2016-12-25: qty 1

## 2016-12-25 MED ORDER — DEXAMETHASONE SODIUM PHOSPHATE 10 MG/ML IJ SOLN
INTRAMUSCULAR | Status: AC
  Filled 2016-12-25: qty 1

## 2016-12-25 SURGICAL SUPPLY — 68 items
ADH SKN CLS APL DERMABOND .7 (GAUZE/BANDAGES/DRESSINGS) ×1
AGENT HMST MTR 8 SURGIFLO (HEMOSTASIS)
APL SRG 60D 8 XTD TIP BNDBL (TIP) ×1
BAND RUBBER 3X1/6 STRL (MISCELLANEOUS) ×2 IMPLANT
BAND RUBBER 3X1/6 TAN STRL (MISCELLANEOUS) ×4 IMPLANT
BLADE BOVIE TIP EXT 4 (BLADE) ×2 IMPLANT
BUR NEURO DRILL SOFT 3.0X3.8M (BURR) ×2 IMPLANT
CANISTER SUCT 1200ML W/VALVE (MISCELLANEOUS) ×2 IMPLANT
CHLORAPREP W/TINT 26ML (MISCELLANEOUS) ×4 IMPLANT
CNTNR SPEC 2.5X3XGRAD LEK (MISCELLANEOUS) ×1
CONT SPEC 4OZ STER OR WHT (MISCELLANEOUS) ×1
CONT SPEC 4OZ STRL OR WHT (MISCELLANEOUS) ×1
CONTAINER SPEC 2.5X3XGRAD LEK (MISCELLANEOUS) ×1 IMPLANT
COUNTER NEEDLE 20/40 LG (NEEDLE) ×2 IMPLANT
COVER LIGHT HANDLE STERIS (MISCELLANEOUS) ×4 IMPLANT
CUP MEDICINE 2OZ PLAST GRAD ST (MISCELLANEOUS) ×4 IMPLANT
DERMABOND ADVANCED (GAUZE/BANDAGES/DRESSINGS) ×1
DERMABOND ADVANCED .7 DNX12 (GAUZE/BANDAGES/DRESSINGS) ×1 IMPLANT
DRAPE C-ARM XRAY 36X54 (DRAPES) ×4 IMPLANT
DRAPE LAPAROTOMY 100X77 ABD (DRAPES) ×2 IMPLANT
DRAPE MICROSCOPE LEICA (MISCELLANEOUS) ×2 IMPLANT
DRAPE POUCH INSTRU U-SHP 10X18 (DRAPES) ×2 IMPLANT
DRAPE SURG 17X11 SM STRL (DRAPES) ×8 IMPLANT
DRESSING TELFA 4X3 1S ST N-ADH (GAUZE/BANDAGES/DRESSINGS) IMPLANT
DRSG TEGADERM 4X4.75 (GAUZE/BANDAGES/DRESSINGS) IMPLANT
DURASEAL APPLICATOR TIP (TIP) ×1 IMPLANT
DURASEAL SPINE SEALANT 3ML (MISCELLANEOUS) ×2 IMPLANT
ELECT CAUTERY BLADE TIP 2.5 (TIP) ×2
ELECT EZSTD 165MM 6.5IN (MISCELLANEOUS)
ELECTRODE CAUTERY BLDE TIP 2.5 (TIP) ×1 IMPLANT
ELECTRODE EZSTD 165MM 6.5IN (MISCELLANEOUS) IMPLANT
FRAME EYE SHIELD (PROTECTIVE WEAR) ×2 IMPLANT
GLOVE SURG SYN 8.5  E (GLOVE) ×3
GLOVE SURG SYN 8.5 E (GLOVE) ×3 IMPLANT
GLOVE SURG SYN 8.5 PF PI (GLOVE) ×3 IMPLANT
GOWN STRL REUS W/ TWL LRG LVL3 (GOWN DISPOSABLE) ×1 IMPLANT
GOWN STRL REUS W/ TWL XL LVL3 (GOWN DISPOSABLE) ×1 IMPLANT
GOWN STRL REUS W/TWL LRG LVL3 (GOWN DISPOSABLE) ×2
GOWN STRL REUS W/TWL XL LVL3 (GOWN DISPOSABLE) ×2
GRADUATE 1200CC STRL 31836 (MISCELLANEOUS) ×2 IMPLANT
GRAFT DURAGEN MATRIX 1WX1L (Tissue) ×1 IMPLANT
KIT SPINAL PRONEVIEW (KITS) ×2 IMPLANT
KNIFE BAYONET SHORT DISCETOMY (MISCELLANEOUS) IMPLANT
MARKER SKIN DUAL TIP RULER LAB (MISCELLANEOUS) ×4 IMPLANT
NDL SAFETY ECLIPSE 18X1.5 (NEEDLE) ×1 IMPLANT
NEEDLE HYPO 18GX1.5 SHARP (NEEDLE) ×2
NEEDLE HYPO 22GX1.5 SAFETY (NEEDLE) ×2 IMPLANT
NS IRRIG 1000ML POUR BTL (IV SOLUTION) ×2 IMPLANT
PACK LAMINECTOMY NEURO (CUSTOM PROCEDURE TRAY) ×2 IMPLANT
PAD ARMBOARD 7.5X6 YLW CONV (MISCELLANEOUS) ×2 IMPLANT
PATTIES SURGICAL .5X1.5 (GAUZE/BANDAGES/DRESSINGS) IMPLANT
SPOGE SURGIFLO 8M (HEMOSTASIS)
SPONGE SURGIFLO 8M (HEMOSTASIS) IMPLANT
STAPLER SKIN PROX 35W (STAPLE) IMPLANT
SUT DVC VLOC 3-0 CL 6 P-12 (SUTURE) ×4 IMPLANT
SUT NURALON 4 0 TR CR/8 (SUTURE) IMPLANT
SUT VIC AB 0 CT1 27 (SUTURE) ×6
SUT VIC AB 0 CT1 27XCR 8 STRN (SUTURE) ×3 IMPLANT
SUT VIC AB 2-0 CT1 18 (SUTURE) ×3 IMPLANT
SUT VIC AB 3-0 SH 27 (SUTURE) ×2
SUT VIC AB 3-0 SH 27X BRD (SUTURE) ×1 IMPLANT
SUT VICRYL 0 UR6 27IN ABS (SUTURE) ×2 IMPLANT
SYR 20CC LL (SYRINGE) ×2 IMPLANT
SYRINGE 10CC LL (SYRINGE) ×2 IMPLANT
TOWEL OR 17X26 4PK STRL BLUE (TOWEL DISPOSABLE) ×4 IMPLANT
TUBE MATRX SPINL 22MM 4CM DISP (INSTRUMENTS) ×2
TUBE METRX SPINAL 22X4 DISP (INSTRUMENTS) IMPLANT
TUBING CONNECTING 10 (TUBING) ×2 IMPLANT

## 2016-12-25 NOTE — Anesthesia Procedure Notes (Addendum)
Procedure Name: Intubation Date/Time: 12/25/2016 7:53 AM Performed by: Jonna Clark Pre-anesthesia Checklist: Patient identified, Patient being monitored, Timeout performed, Emergency Drugs available and Suction available Patient Re-evaluated:Patient Re-evaluated prior to inductionOxygen Delivery Method: Circle system utilized Preoxygenation: Pre-oxygenation with 100% oxygen Intubation Type: IV induction Ventilation: Mask ventilation without difficulty Laryngoscope Size: Mac and 3 Grade View: Grade III Tube type: Oral Tube size: 7.0 mm Number of attempts: 2 Airway Equipment and Method: Stylet Placement Confirmation: ETT inserted through vocal cords under direct vision,  positive ETCO2 and breath sounds checked- equal and bilateral Secured at: 21 cm Tube secured with: Tape Dental Injury: Teeth and Oropharynx as per pre-operative assessment  Difficulty Due To: Difficult Airway- due to anterior larynx Future Recommendations: Recommend- induction with short-acting agent, and alternative techniques readily available Comments: Able to view arytenoids and bottom of cords with mac 3 and tracheal pressure.

## 2016-12-25 NOTE — Anesthesia Postprocedure Evaluation (Signed)
Anesthesia Post Note  Patient: Tamara [name]  Procedure(s) Performed: Procedure(s) (LRB): LUMBAR DECOMPRESSION L4-5 (N/A)  Patient location during evaluation: PACU Anesthesia Type: General Level of consciousness: awake and alert Pain management: pain level controlled Vital Signs Assessment: post-procedure vital signs reviewed and stable Respiratory status: spontaneous breathing, nonlabored ventilation, respiratory function stable and patient connected to nasal cannula oxygen Cardiovascular status: blood pressure returned to baseline and stable Postop Assessment: no signs of nausea or vomiting Anesthetic complications: no     Last Vitals:  Vitals:   12/25/16 1042 12/25/16 1054  BP: 121/70 115/62  Pulse: 91 94  Resp: 12 16  Temp: 36.8 C 36.7 C    Last Pain:  Vitals:   12/25/16 1054  TempSrc: Oral  PainSc:                  Heriberto Stmartin S

## 2016-12-25 NOTE — Anesthesia Preprocedure Evaluation (Addendum)
Anesthesia Evaluation  Patient identified by MRN, date of birth, ID band Patient awake    Reviewed: Allergy & Precautions, NPO status , Patient's Chart, lab work & pertinent test results, reviewed documented beta blocker date and time   Airway Mallampati: II  TM Distance: >3 FB     Dental  (+) Chipped   Pulmonary COPD, Current Smoker,           Cardiovascular      Neuro/Psych PSYCHIATRIC DISORDERS Depression  Neuromuscular disease    GI/Hepatic   Endo/Other    Renal/GU      Musculoskeletal   Abdominal   Peds  (+) NICU stay Hematology   Anesthesia Other Findings Cervical fusion. Neck movement ok. Crohns. Abdominal surgeries for ruptured spleen. No syncope. Hx os PACs.  Reproductive/Obstetrics                            Anesthesia Physical Anesthesia Plan  ASA: III  Anesthesia Plan: General   Post-op Pain Management:    Induction: Intravenous  Airway Management Planned: Oral ETT  Additional Equipment:   Intra-op Plan:   Post-operative Plan:   Informed Consent: I have reviewed the patients History and Physical, chart, labs and discussed the procedure including the risks, benefits and alternatives for the proposed anesthesia with the patient or authorized representative who has indicated his/her understanding and acceptance.     Plan Discussed with: CRNA  Anesthesia Plan Comments:         Anesthesia Quick Evaluation

## 2016-12-25 NOTE — Discharge Instructions (Addendum)
Your surgeon has performed an operation on your lumbar spine (low back) to relieve pressure on one or more nerves. Many times, patients feel better immediately after surgery and can overdo it. Even if you feel well, it is important that you follow these activity guidelines. If you do not let your back heal properly from the surgery, you can increase the chance of a disc herniation and return of your symptoms. The following are instructions to help in your recovery once you have been discharged from the hospital.  * Do not take anti-inflammatory medications for 5 days after surgery (naproxen [Aleve], ibuprofen [Advil, Motrin], celecoxib [Celebrex], etc.)  Activity    Please lay flat most of the day today until the morning tomorrow.   No bending, lifting, or twisting (BLT). Avoid lifting objects heavier than 10 pounds (gallon milk jug).  Where possible, avoid household activities that involve lifting, bending, pushing, or pulling such as laundry, vacuuming, grocery shopping, and childcare. Try to arrange for help from friends and family for these activities while your back heals.  Increase physical activity slowly as tolerated.  Taking short walks is encouraged, but avoid strenuous exercise. Do not jog, run, bicycle, lift weights, or participate in any other exercises unless specifically allowed by your doctor. Avoid prolonged sitting, including car rides.  Talk to your doctor before resuming sexual activity.  You should not drive until cleared by your doctor.  Until released by your doctor, you should not return to work or school.  You should rest at home and let your body heal.   You may shower three days after your surgery.  After showering, lightly dab your incision dry. Do not take a tub bath or go swimming until approved by your doctor at your follow-up appointment.  If you smoke, we strongly recommend that you quit.  Smoking has been proven to interfere with normal healing in your back  and will dramatically reduce the success rate of your surgery. Please contact [facility] (800-QUIT-NOW) and use the resources at www.[facility].com for assistance in stopping smoking.  Surgical Incision   If you have a dressing on your incision, you may remove it four days after your surgery. Keep your incision area clean and dry.  If you have staples or stitches on your incision, you should have a follow up scheduled for removal. If you do not have staples or stitches, you will have steri-strips (small pieces of surgical tape) or Dermabond glue. The steri-strips/glue should begin to peel away within about a week (it is fine if the steri-strips fall off before then). If the strips are still in place one week after your surgery, you may gently remove them.  Diet            You may return to your usual diet. Be sure to stay hydrated.  When to Contact us  Although your surgery and recovery will likely be uneventful, you may have some residual numbness, aches, and pains in your back and/or legs. This is normal and should improve in the next few weeks.  However, should you experience any of the following, contact us immediately:  New numbness or weakness  Pain that is progressively getting worse, and is not relieved by your pain medications or rest  Bleeding, redness, swelling, pain, or drainage from surgical incision  Chills or flu-like symptoms  Fever greater than 101.0 F (38.3 C)  Problems with bowel or bladder functions  Difficulty breathing or shortness of breath  Warmth, tenderness, or swelling in  your calf  Contact Information  During office hours (Monday-Friday 9 am to 5 pm), please call your physician at [phone]  After hours and weekends, please call the [facility] Operator at [phone] and ask for the Neurosurgery Resident On Call   For a life-threatening emergency, call 911

## 2016-12-25 NOTE — H&P (Signed)
Tamara [name] is an 71 y.o. female.   Chief Complaint: leg pain HPI: 71 yo female with right greater than left leg pain. This has been worsening for 1-2 years and is refractory to conservative management. She ultimately opted for surgical management.  Past Medical History:  Diagnosis Date  . Bowel obstruction    s/p laparotomy/ LOA 3/08, prior reversal of jejunal bypasss 2004  . Chronic abdominal pain   . COPD (chronic obstructive pulmonary disease) ([place])   . Crohn's disease ([place])   . Depression   . History of DVT (deep vein thrombosis) 01/2007   post operative right leg, s/p vena cava filter  . Osteoporosis     Past Surgical History:  Procedure Laterality Date  . ABDOMINAL HYSTERECTOMY    . APPENDECTOMY    . CATARACT EXTRACTION    . CERVICAL FUSION  2005   C6-7, anterior approach  . CHOLECYSTECTOMY    . gyn surgery     hysterectomy  . LAPAROTOMY  01/30/07   for bowel obstruction with LOA  . NISSEN FUNDOPLICATION    . ROTATOR CUFF REPAIR     right  . SPLENECTOMY     secondary to colonoscopy prep  . TONSILLECTOMY      Family History  Problem Relation Age of Onset  . Diabetes Mother     type 2  . Heart disease Mother   . Hypertension Mother   . Coronary artery disease Father    Social History:  reports that she has been smoking Cigarettes.  She has been smoking about 1.00 pack per day. She has never used smokeless tobacco. She reports that she does not drink alcohol or use drugs.  Allergies:  Allergies  Allergen Reactions  . Sulfa Antibiotics Rash    Medications Prior to Admission  Medication Sig Dispense Refill  . aspirin EC 81 MG tablet Take 81 mg by mouth every evening.    . clonazePAM (KLONOPIN) 0.5 MG tablet TAKE 1 TABLET BY MOUTH ONCE DAILY AS NEEDED FOR ANXIETY. 30 tablet 5  . Cyanocobalamin 1000 MCG SUBL Place 1 tablet (1,000 mcg total) under the tongue daily. 90 tablet 3  . gabapentin (NEURONTIN) 300 MG capsule TAKE 1 CAPSULE BY MOUTH ONCE DAILY  (Patient taking differently: TAKE 1 CAPSULE BY MOUTH ONCE AT BEDTIME) 90 capsule 0  . HYDROcodone-acetaminophen (NORCO) 7.5-325 MG tablet Take 1 tablet by mouth every 6 (six) hours as needed for moderate pain. 90 tablet 0  . nortriptyline (PAMELOR) 25 MG capsule TAKE 1 CAPSULE BY MOUTH EACH NIGHT AT BEDTIME. 90 capsule 1  . sertraline (ZOLOFT) 100 MG tablet Take 1 tablet (100 mg total) by mouth daily. (Patient taking differently: Take 100 mg by mouth at bedtime. ) 30 tablet 6  . traZODone (DESYREL) 50 MG tablet Take 1 tablet (50 mg total) by mouth at bedtime. 90 tablet 1  . nicotine (NICODERM CQ) 21 mg/24hr patch Place 1 patch (21 mg total) onto the skin daily. (Patient not taking: Reported on 12/13/2016) 28 patch 0  . predniSONE (DELTASONE) 10 MG tablet 6 tablets on Day 1 , then reduce by 1 tablet daily until gone (Patient not taking: Reported on 12/13/2016) 21 tablet 0  . varenicline (CHANTIX STARTING MONTH PAK) 0.5 MG X 11 & 1 MG X 42 tablet Take one 0.5 mg tablet by mouth once daily for 3 days, then increase to one 0.5 mg tablet twice daily for 4 days, then increase to one 1 mg tablet twice daily. (Patient  not taking: Reported on 12/13/2016) 53 tablet 0    No results found for this or any previous visit (from the past 48 hour(s)). No results found.  Review of Systems  All other systems reviewed and are negative.   Blood pressure 109/62, pulse 100, temperature 98.2 F (36.8 C), temperature source Oral, resp. rate 20, SpO2 97 %. Physical Exam  Constitutional: She is oriented to person, place, and time. She appears well-developed and well-nourished.  Cardiovascular: Normal rate, regular rhythm and normal heart sounds.   Respiratory: Effort normal and breath sounds normal. No respiratory distress. She has no wheezes.  Neurological: She is oriented to person, place, and time. She has normal strength and normal reflexes. No cranial nerve deficit or sensory deficit. GCS eye subscore is 4. GCS verbal  subscore is 5. GCS motor subscore is 6.    MRI - L4-5 lumbar stenosis affecting R greater than L L5 nerve roots with slight discal cysts affecting this level.  Assessment/Plan Severe lumbar stenosis with neurogenic claudication  - to OR for MIS decompression - Risks and benefits reviewed.  Meade Maw, MD 12/25/2016, 7:04 AM

## 2016-12-25 NOTE — Progress Notes (Signed)
    Attending Progress Note  History: Tamara [name] is here for lumbar decompression. Her pain is gone. She has some slight numbness in her feet and buttocks, but no pain.  She is able to move throughout with no pain or headaches.  Physical Exam: Vitals:   12/25/16 1042 12/25/16 1054  BP: 121/70 115/62  Pulse: 91 94  Resp: 12 16  Temp: 98.2 F (36.8 C) 98 F (36.7 C)    AA Ox3 CNI  Strength:5/5 throughout BLE  Dressing c/d/i  Data:  No results for input(s): NA, K, CL, CO2, BUN, CREATININE, LABGLOM, GLUCOSE, CALCIUM in the last 168 hours. No results for input(s): AST, ALT, ALKPHOS in the last 168 hours.  Invalid input(s): TBILI   No results for input(s): WBC, HGB, HCT, PLT in the last 168 hours. No results for input(s): APTT, INR in the last 168 hours.       Other tests/results: none  Assessment/Plan:  Tamara [name] is doing well POD0. She can be discharged home per our prior plan.  Instructions given to patient.   Meade Maw MD, [facility] Department of Neurosurgery

## 2016-12-25 NOTE — Transfer of Care (Signed)
Immediate Anesthesia Transfer of Care Note  Patient: Tamara [name]  Procedure(s) Performed: Procedure(s): LUMBAR DECOMPRESSION L4-5 (N/A)  Patient Location: PACU  Anesthesia Type:General  Level of Consciousness: awake, alert  and oriented  Airway & Oxygen Therapy: Patient Spontanous Breathing and Patient connected to face mask oxygen  Post-op Assessment: Report given to RN and Post -op Vital signs reviewed and stable  Post vital signs: Reviewed and stable  Last Vitals:  Vitals:   12/25/16 0617 12/25/16 1000  BP: 109/62 126/71  Pulse: 100 96  Resp: 20 18  Temp: 36.8 C 36.7 C    Last Pain:  Vitals:   12/25/16 0617  TempSrc: Oral  PainSc: 3          Complications: No apparent anesthesia complications

## 2016-12-25 NOTE — Op Note (Addendum)
Indications: Tamara [name] is a 71 yo female with severe lumbar stenosis at L4/5 with neurogenic claudication.  She failed conservative management, and elected for surgical intervention.  Findings: severe lumber stenosis  Preoperative and Postoperative Diagnosis - Lumbar stenosis with neurogenic claudication  Preoperative Note:The patient's symptoms were confirmed, and risks and benefits reviewed.   Risks of surgery discussed include: infection, bleeding, stroke, coma, death, paralysis, CSF leak, nerve/spinal cord injury, numbness, tingling, weakness, complex regional pain syndrome, recurrent stenosis and/or disc herniation, vascular injury, development of instability, neck/back pain, need for further surgery, persistent symptoms, development of deformity, and the risks of anesthesia. They understood these risks and have agreed to proceed.  Operative Note:   Procedure:   1. L4-5 lumbar decompression including central laminectomy and bilateral medial facetectomies including foraminotomies using a minimally invasive approach  The patient was then brought from the preoperative center with intravenous access established.  The patient underwent general anesthesia and endotracheal tube intubation, and was then rotated on the [place] rail top where all pressure points were appropriately padded.  The skin was then thoroughly cleansed.  Perioperative antibiotic prophylaxis was administered.  Sterile prep and drapes were then applied and a timeout was then observed.  C-arm was brought into the field under sterile conditions and under lateral visualization the L4-5 interspace was identified and marked.  The incision was marked on the right 1 cm lateral to the midline and injected with local anesthetic. Once this was complete a 2 cm incision was opened with the use of a #10 blade knife.  The metrx tubes were sequentially advanced and confirmed in position. An 9m by 430mtube was locked in place to the bed  side attachment.    The microscope was then sterilely brought into the field and muscle creep was hemostased with a bipolar and resected with a pituitary rongeur.  A Bovie extender was then used to expose the spinous process and lamina.  Careful attention was placed to not violate the facet capsule. A 3 mm matchstick drill bit was then used to make a hemi-laminotomy trough until the ligamentum flavum was exposed.  This was extended to the base of the spinous process and to the contralateral side to remove all the central bone from each side.  Once this was complete and the underlying ligamentum flavum was visualized, it was dissected with a curette and resected with Kerrison rongeurs.  Extensive ligamentum hypertrophy was noted, requiring a substantial amount of time and care for removal.  The dura was identified and palpated. The kerrison rongeur was then used to remove the medial facet bilaterally until no compression was noted.  A balltip probe was used to confirm decompression of the right L5 nerve root.  Additional attention was paid to completion of the left L4-5 foraminotomy until the left L5 nerve root was completely free.  Once this was complete, L4-5 central decompression including medial facetectomy and foraminotomy was confirmed and decompression on both sides was confirmed. During decompression of the midline, a CSF leak was noted. A small hole was noted at the superior margin of the central decompression, not amenable to stitch placement. This was fully identified and inspected.   Fluoroscopy was then removed from the field after confirmation of successful decompression.  The wound was copiously irrigated. Duragen was placed over the dural defect.  This was augmented with Duraseal.  No active CSF leak was noted at this time.   The tube system was then removed under microscopic visualization and hemostasis was  obtained with a bipolar.  The fascial layer was reapproximated with the use of a 0  Vicryl suture.  Subcutaneous tissue layer was reapproximated using 2-0 Vicryl suture.  3-0 monocryl was placed in subcuticular fashion. The skin was then cleansed and Dermabond was used to close the skin opening.  Patient was then rotated back to the preoperative bed awakened from anesthesia and taken to recovery all counts are correct in this case.  I performed the entire procedure.  Renan Danese K. Izora Ribas MD  EBL: 25 ml No drains IVF: 500 ml Disposition: PACU extubated and stable

## 2016-12-25 NOTE — Anesthesia Post-op Follow-up Note (Cosign Needed)
Anesthesia QCDR form completed.        

## 2016-12-26 ENCOUNTER — Encounter: Payer: Self-pay | Admitting: Neurosurgery

## 2017-01-05 IMAGING — CR DG WRIST COMPLETE 3+V*L*
1 series · 4 of 4 positions shown · non-contrast
Comparison: None.

CLINICAL DATA: Patient fell off of a stool while hanging a picture
at home earlier today, injuring the left wrist. Initial encounter.

EXAM:
LEFT WRIST - COMPLETE 3+ VIEW

[Series 1: dg wrist complete left · 0.14mm/px · 4 of 4 slices shown]
[im 1/4]
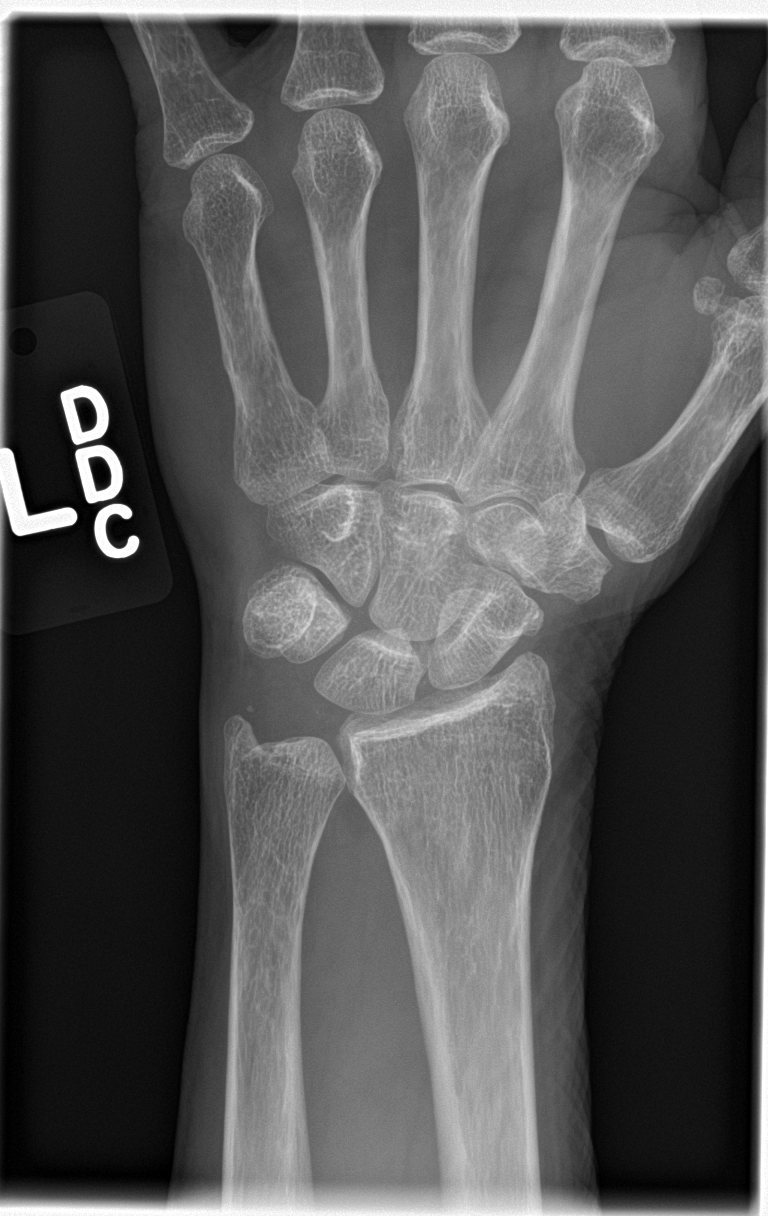
[im 2/4]
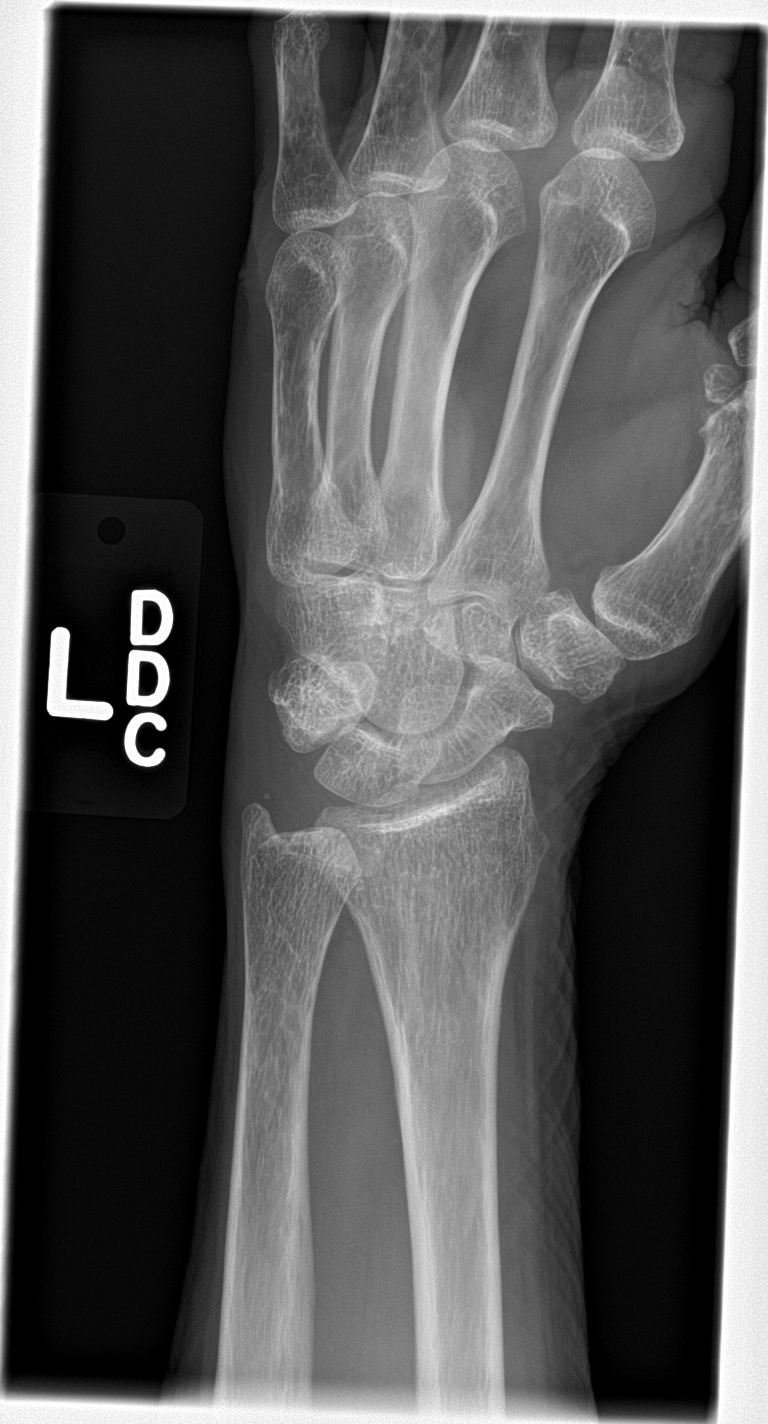
[im 3/4]
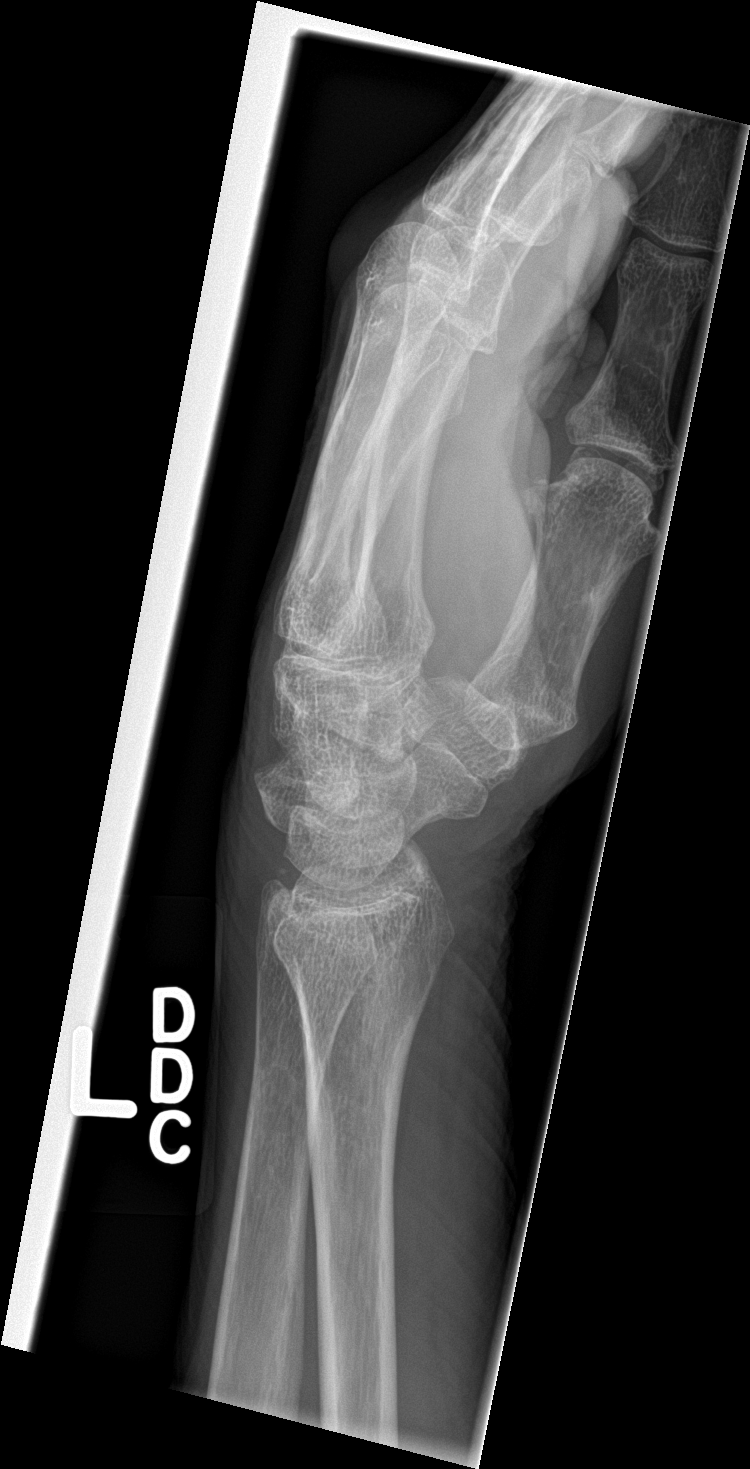
[im 4/4]
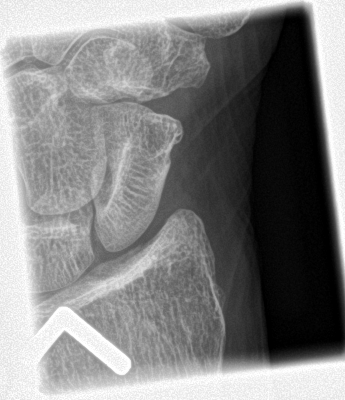

[4 of 4 positions shown; findings below may reference images not displayed]

FINDINGS: No evidence of acute fracture or dislocation. Joint spaces well
preserved for age. Mild osseous demineralization. Tiny accessory
ossicle or calcification in the triangular fibrocartilage complex
adjacent to the ulnar styloid.
IMPRESSION: No acute osseous abnormality.

## 2017-01-05 IMAGING — CT CT HEAD W/O CM
3 of 4 series · 16 of 30 positions shown, 18 images · non-contrast
Comparison: Brain MR of 12/24/2014. Cervical spine plain films of
05/25/2010.

CLINICAL DATA: Fall. Head injury. Laceration noted on forehead.
Neck pain. Prior cervical fusion.

EXAM:
CT HEAD WITHOUT CONTRAST
CT CERVICAL SPINE WITHOUT CONTRAST
TECHNIQUE: Multidetector CT imaging of the head and cervical spine was
performed following the standard protocol without intravenous
contrast. Multiplanar CT image reconstructions of the cervical spine
were also generated.

[Series 3: head bone · axial · 0.40mm/px · z∈[-127,-7]mm · 6 of 85 slices shown]
[im 13/85  bone]
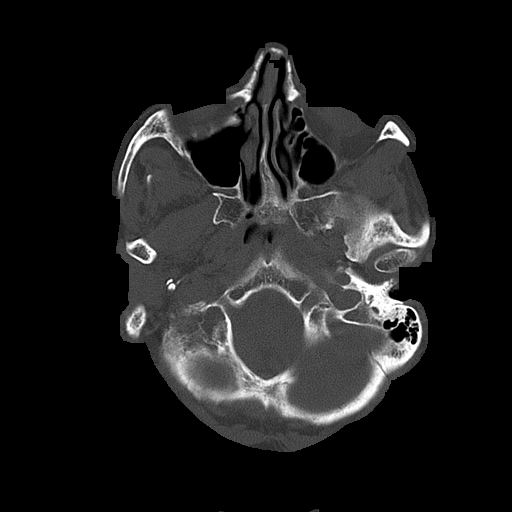
[im 25/85  bone]
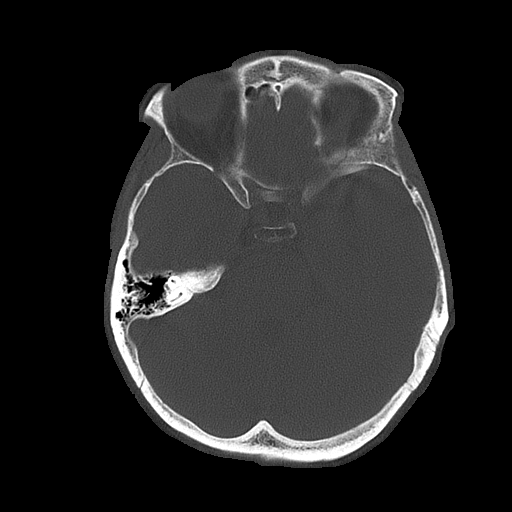
[im 37/85  bone]
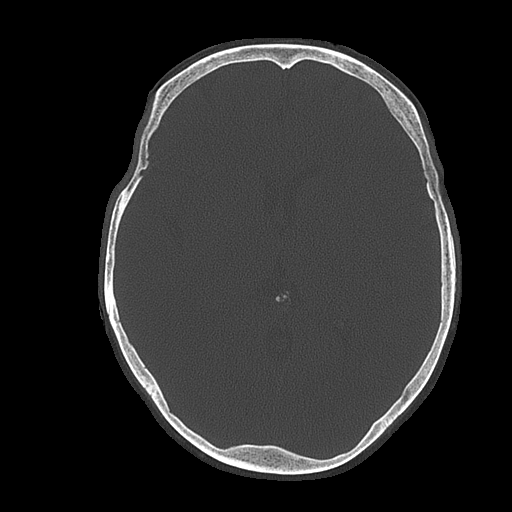
[im 49/85  bone]
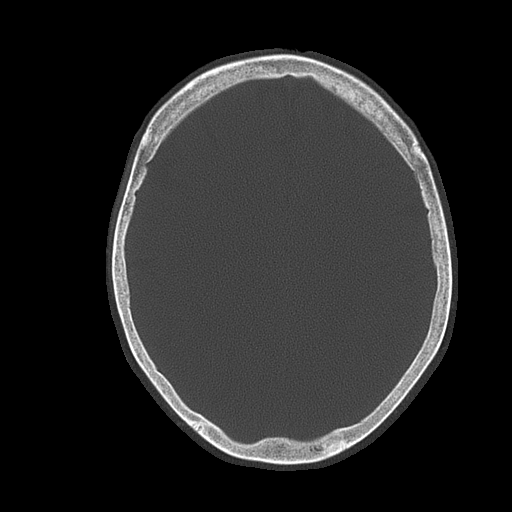
[im 61/85  bone]
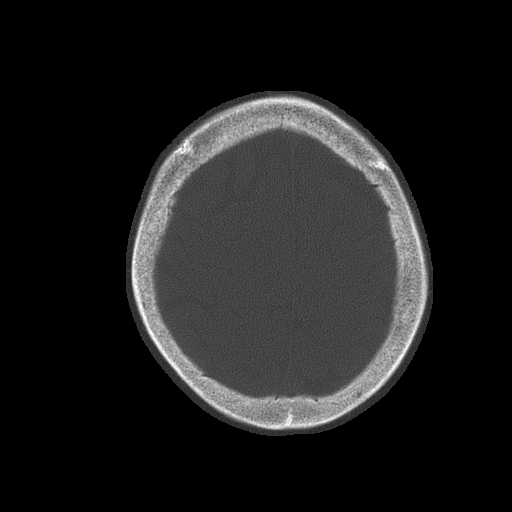
[im 73/85  bone]
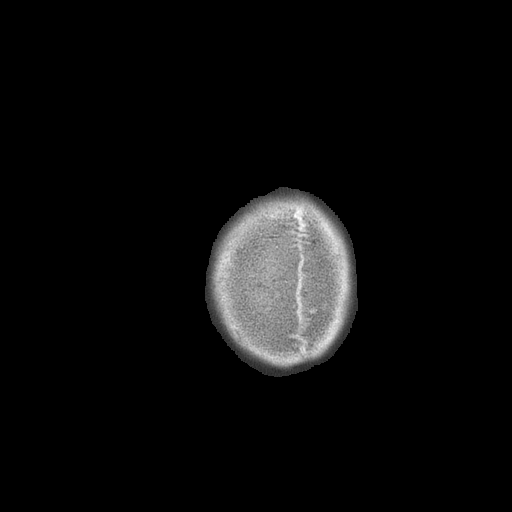

[Series 5: c spine soft · axial · 0.35mm/px · z∈[-268,-244]mm · 2 of 95 slices shown]
[im 12/95  brain]
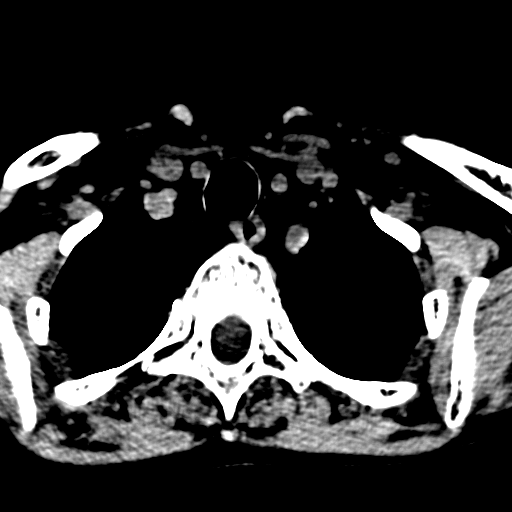
[im 24/95  brain]
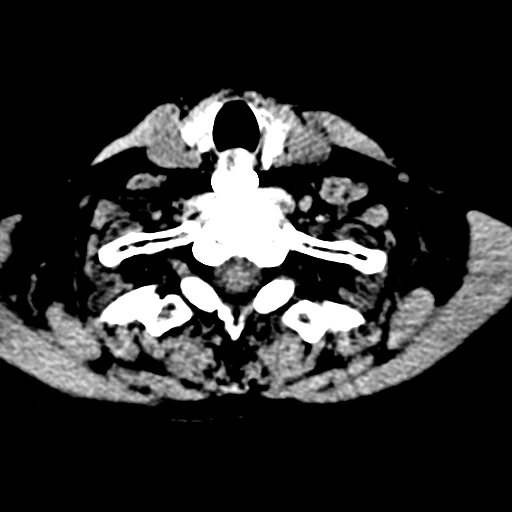

[Series 10: orthogonal axials · axial · 0.19mm/px · z∈[-278,-149]mm · 8 of 100 slices shown, 10 images]
[im 12/100  brain]
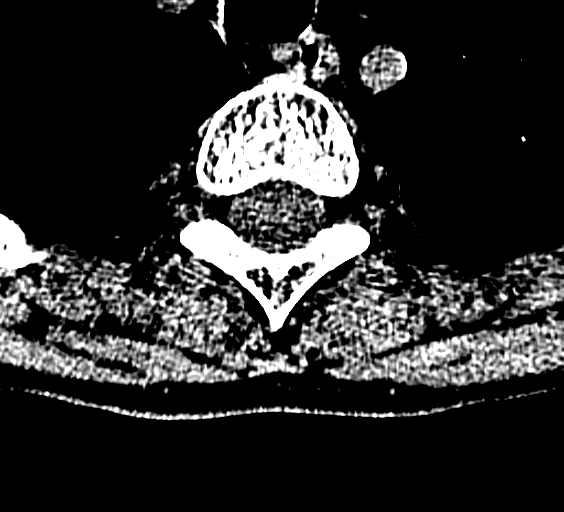
[im 12/100  bone]
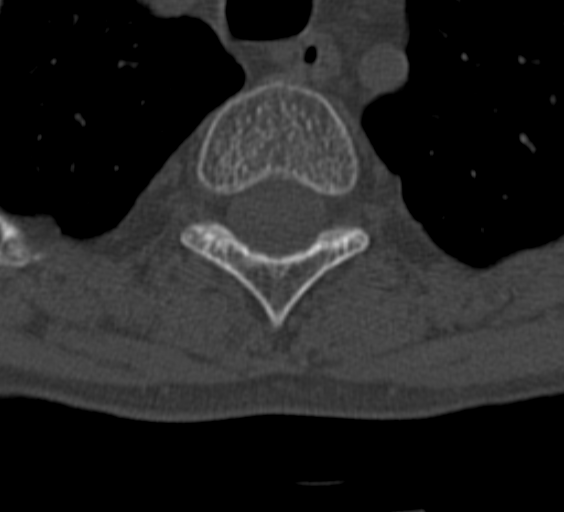
[im 23/100  brain]
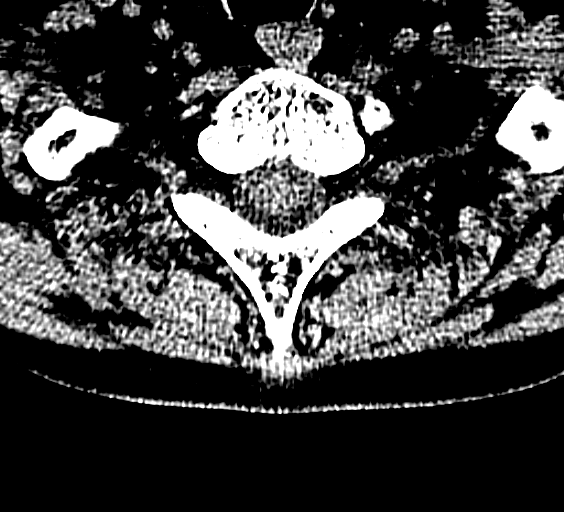
[im 34/100  brain]
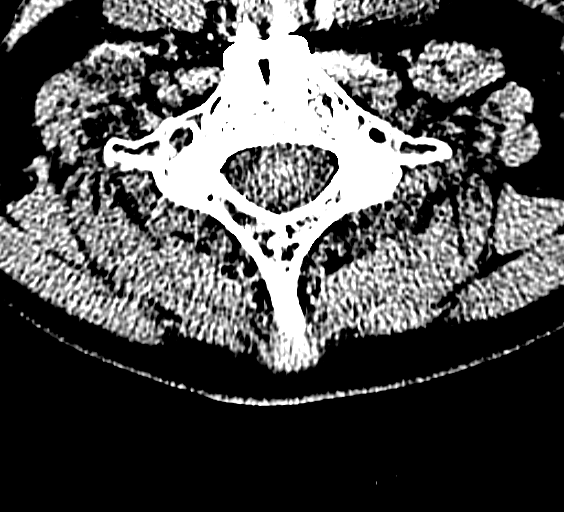
[im 45/100  brain]
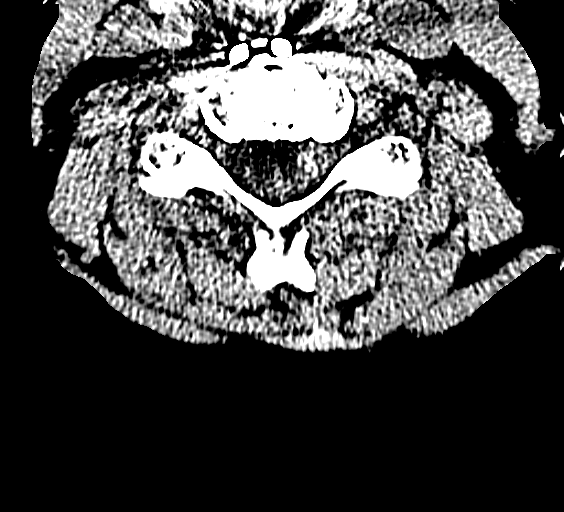
[im 56/100  brain]
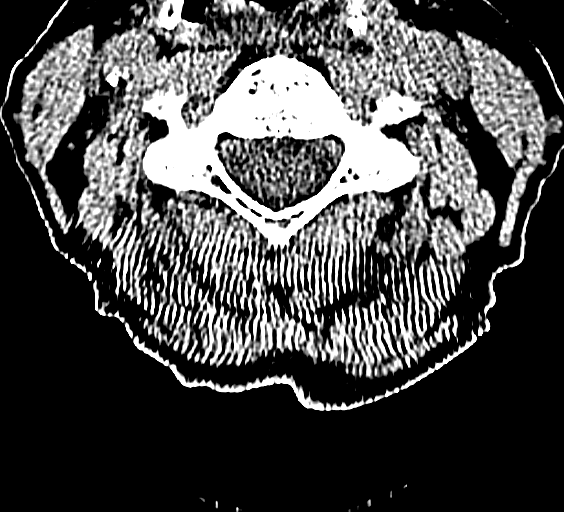
[im 56/100  bone]
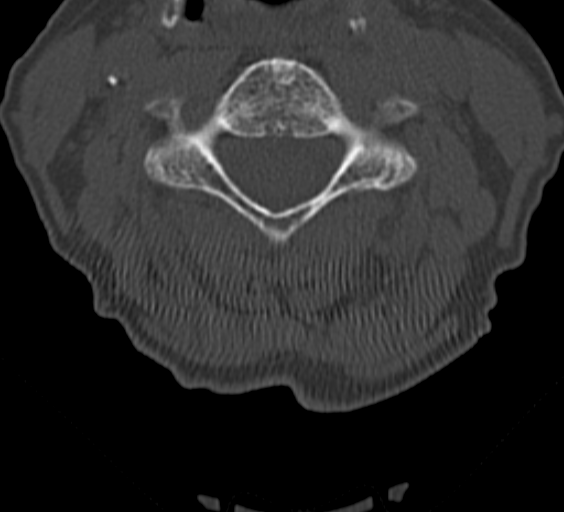
[im 67/100  brain]
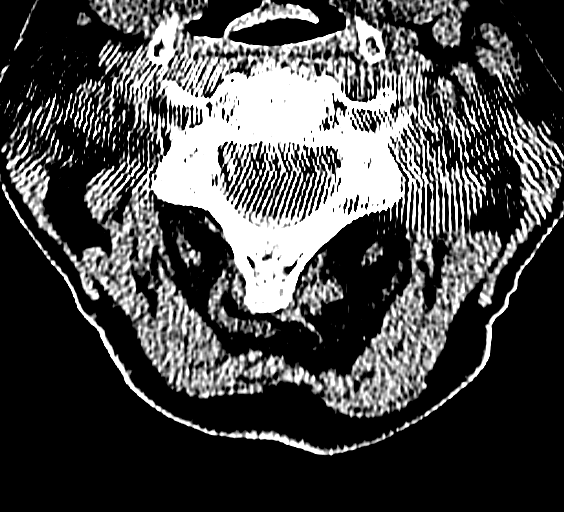
[im 78/100  brain]
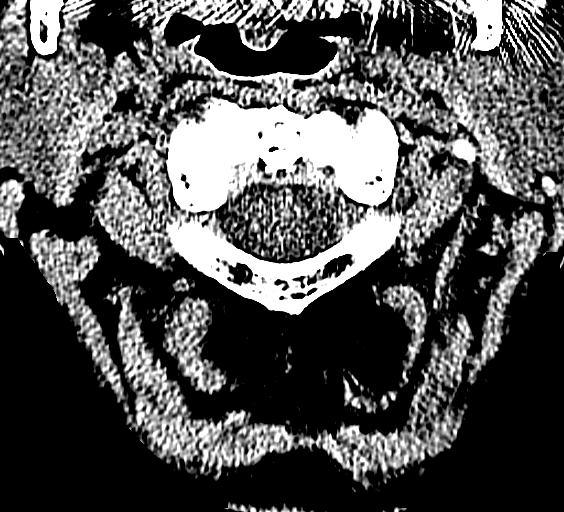
[im 89/100  brain]
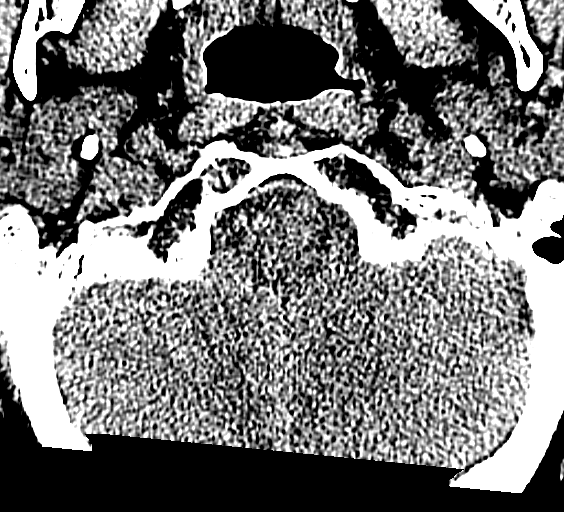

[16 of 30 positions shown; findings below may reference images not displayed]

FINDINGS: CT HEAD FINDINGS

Sinuses/Soft tissues: Soft tissue injury about the left frontal
scalp. No underlying skull fracture. Clear paranasal sinuses and
mastoid air cells.

Intracranial: No mass lesion, hemorrhage, hydrocephalus, acute
infarct, intra-axial, or extra-axial fluid collection.

CT CERVICAL SPINE FINDINGS

Spinal visualization through the bottom of T2. Prevertebral soft
tissues are within normal limits. Status post anterior fixation at
C5-7 without acute hardware complication. Centrilobular emphysema,
without apical pneumothorax. Skull base intact. Maintenance of
vertebral body height and alignment. Facets are well-aligned.
Coronal reformats demonstrate a normal C1-C2 articulation..
IMPRESSION: 1. Left frontal scalp soft tissue injury, without acute intracranial
abnormality.
2. No acute findings in the cervical spine. Anterior fixation at
C5-7, without acute complication

## 2017-03-01 ENCOUNTER — Other Ambulatory Visit: Payer: Self-pay | Admitting: Internal Medicine

## 2017-05-01 ENCOUNTER — Encounter: Payer: Self-pay | Admitting: Internal Medicine

## 2017-05-01 ENCOUNTER — Other Ambulatory Visit: Payer: Self-pay | Admitting: Internal Medicine

## 2017-05-01 NOTE — Telephone Encounter (Signed)
refilled 

## 2017-05-01 NOTE — Telephone Encounter (Signed)
Last OV was 10/21/16, last refill on Klonopin was 10/21/2016 #30 with 5 refills, please advise, has an upcoming appt on 06/11/2017.

## 2017-05-02 ENCOUNTER — Other Ambulatory Visit: Payer: Self-pay | Admitting: Internal Medicine

## 2017-05-02 ENCOUNTER — Encounter: Payer: Self-pay | Admitting: Internal Medicine

## 2017-05-02 MED ORDER — SERTRALINE HCL 100 MG PO TABS: 100.0000 mg | ORAL_TABLET | Freq: Every day | ORAL | 5 refills | Status: DC

## 2017-05-02 NOTE — Telephone Encounter (Signed)
Tanya re faxed rx.

## 2017-05-02 NOTE — Telephone Encounter (Signed)
Did you fax this yesterday, She approved it, if so can you refax today, thanks

## 2017-05-04 IMAGING — CR DG RIBS W/ CHEST 3+V*R*
5 series · 5 of 5 positions shown · non-contrast
Comparison: Chest x-ray 10/07/2014

CLINICAL DATA: Fell today and injured right ribs.

EXAM:
RIGHT RIBS AND CHEST - 3+ VIEW

[chest pa]
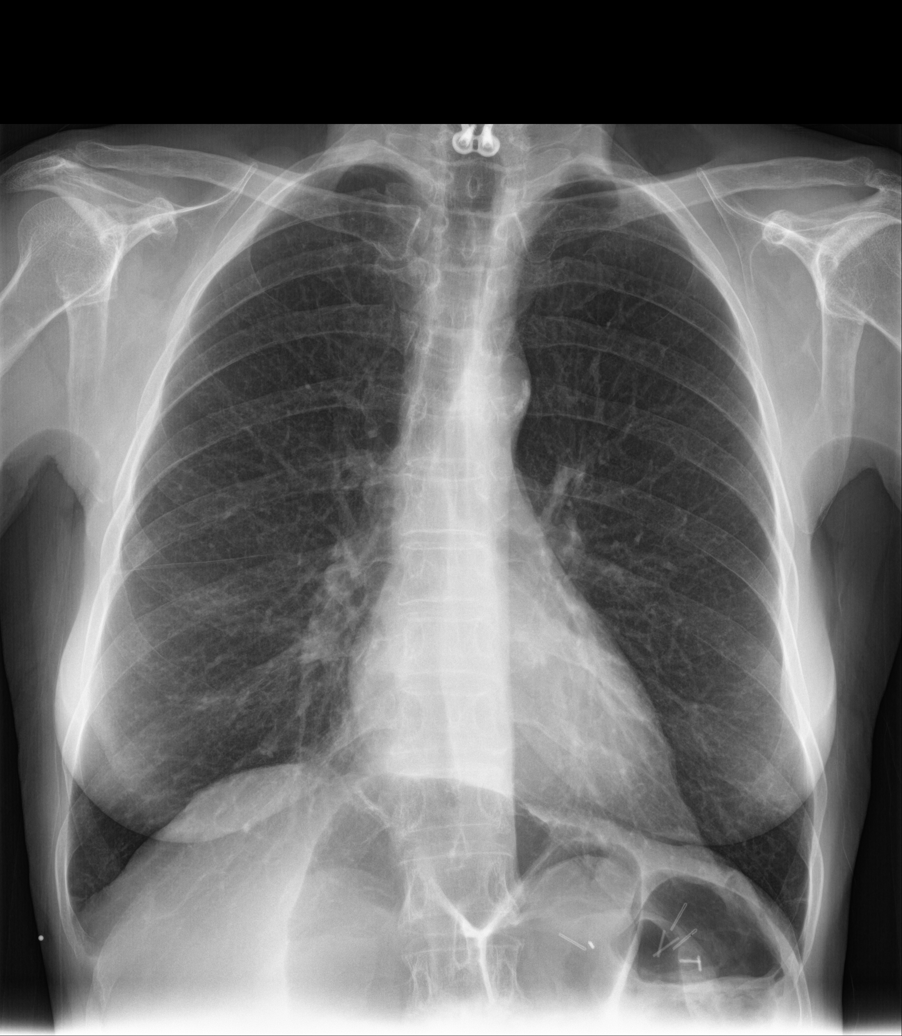

[rib pa (1 of 2)]
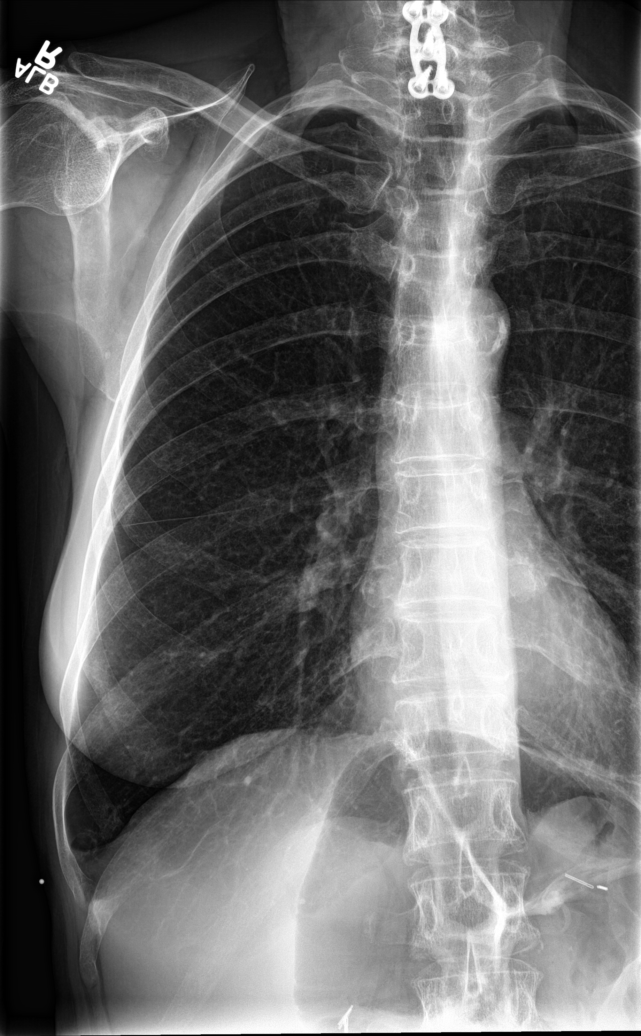

[rib pa (2 of 2)]
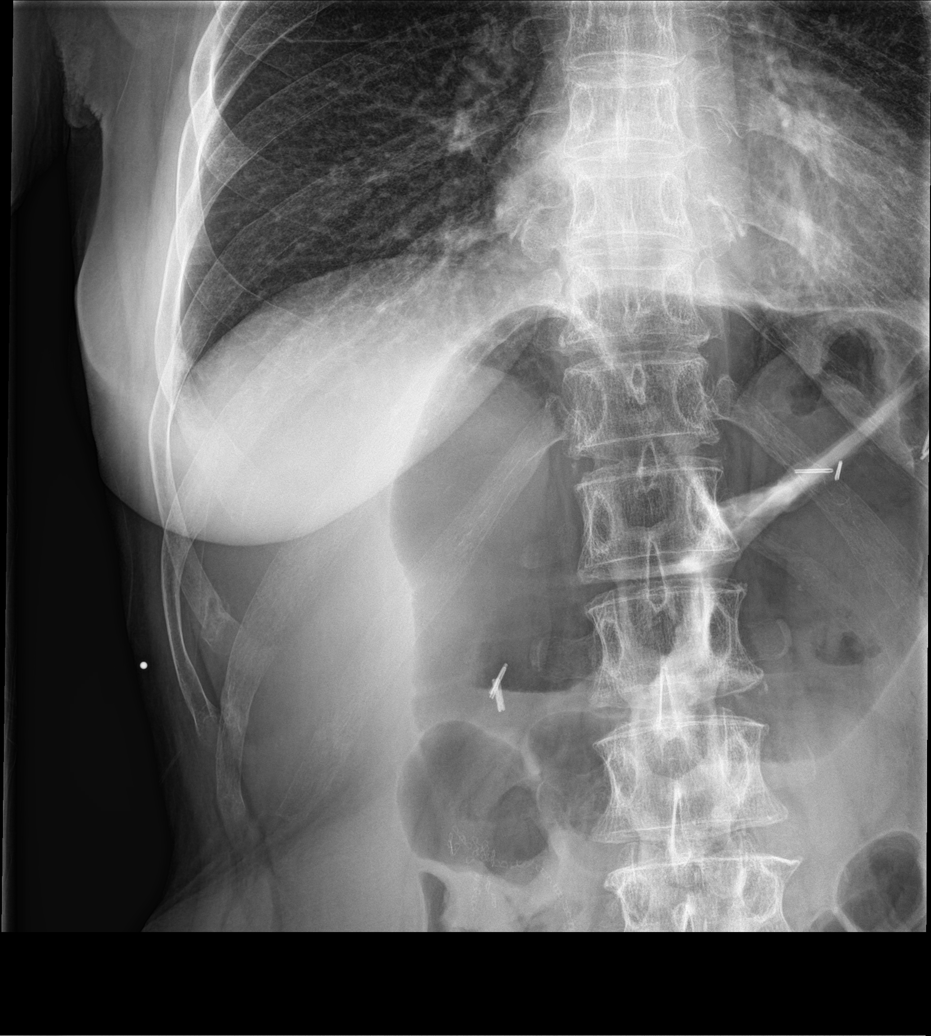

[rib obl (1 of 2)]
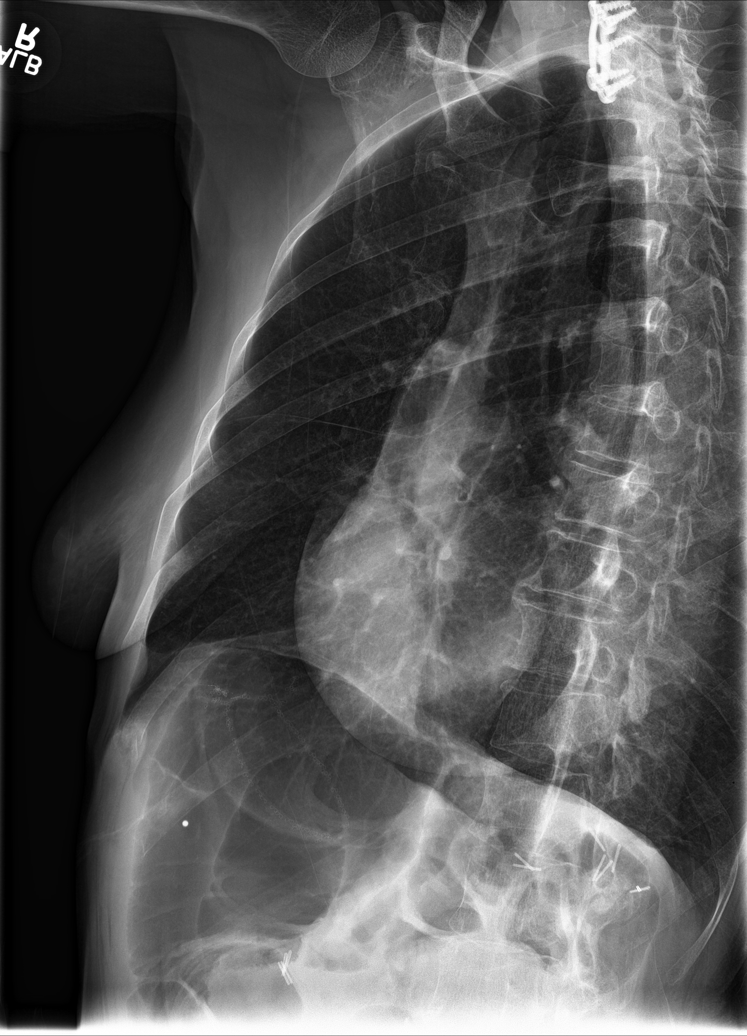

[rib obl (2 of 2)]
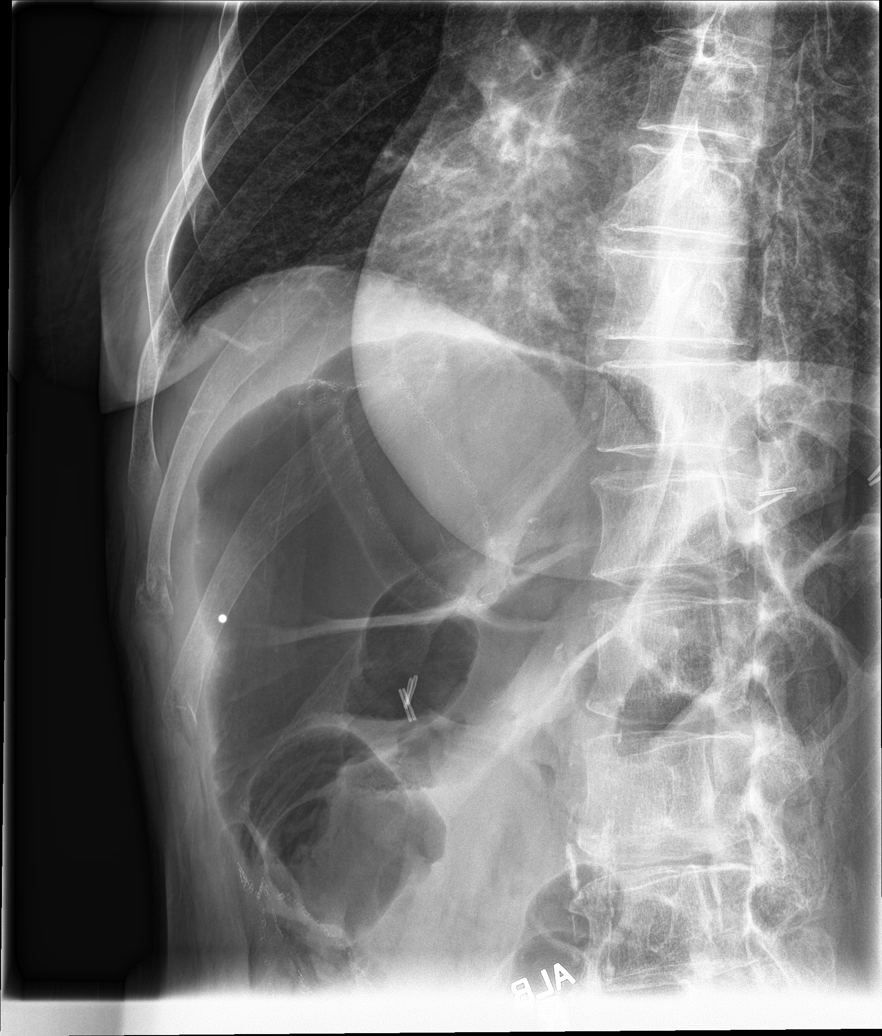

[5 of 5 positions shown; findings below may reference images not displayed]

FINDINGS: The cardiac silhouette, mediastinal and hilar contours are normal.
There is mild tortuosity and calcification of the thoracic aorta.
The lungs are clear. No pleural effusion or pneumothorax

Dedicated views of the right ribs demonstrate minimally displaced
fractures of the ninth and tenth anterior ribs.
IMPRESSION: Minimally displaced fractures of the right ninth and tenth anterior
ribs.

No acute cardiopulmonary findings.

## 2017-05-04 IMAGING — CR DG SACRUM/COCCYX 2+V
3 series · 3 of 3 positions shown · non-contrast
Comparison: None.

CLINICAL DATA: Fall this morning, tailbone pain

EXAM:
SACRUM AND COCCYX - 2+ VIEW

[sacrum lat]
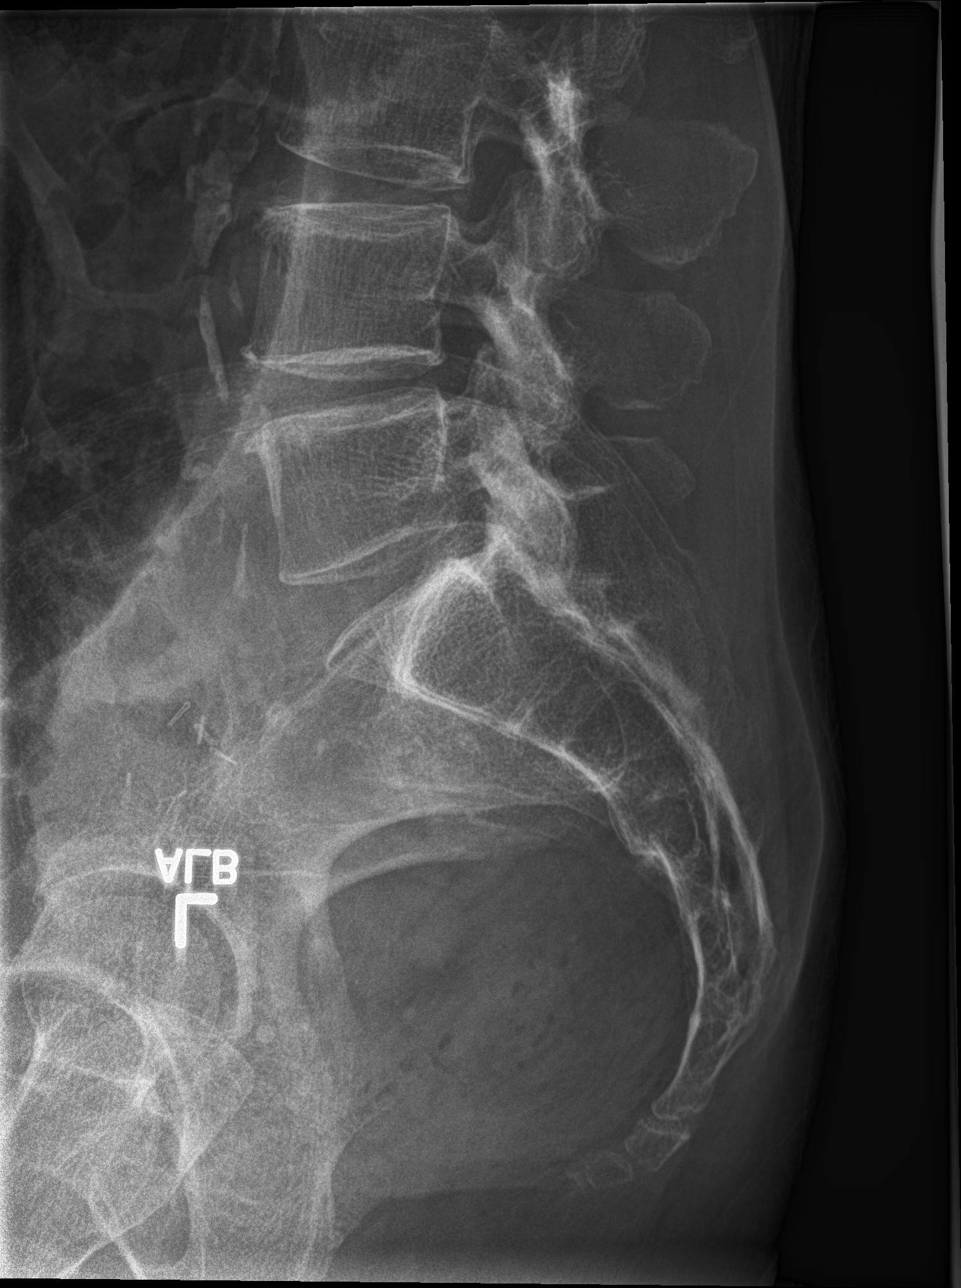

[sacrum ap]
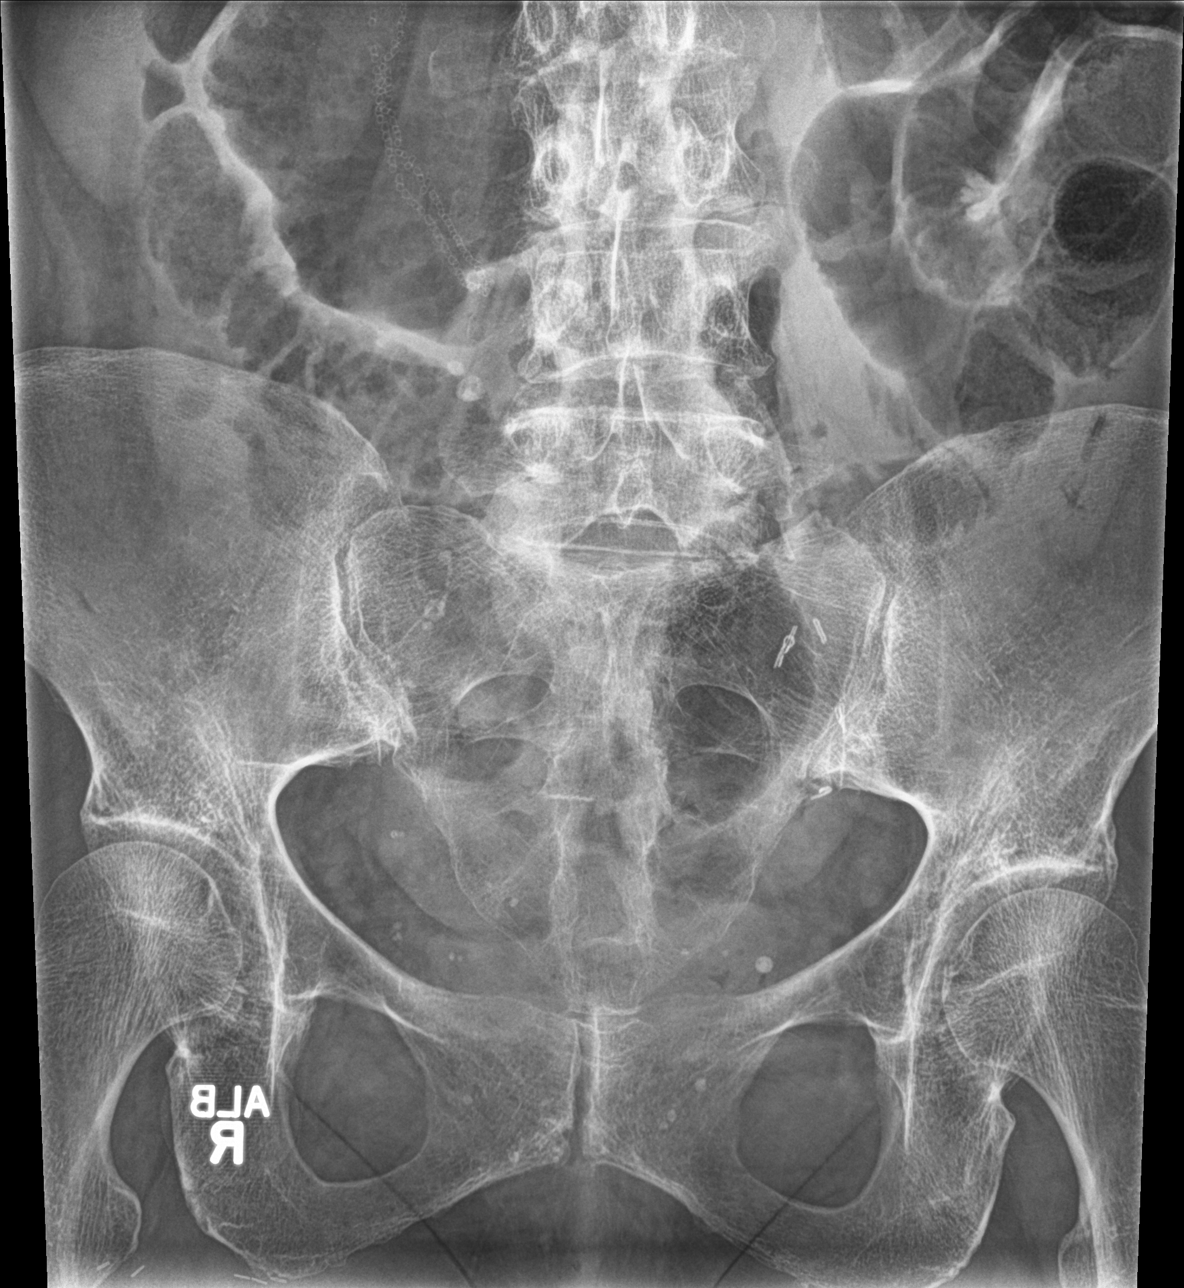

[coccyx ap]
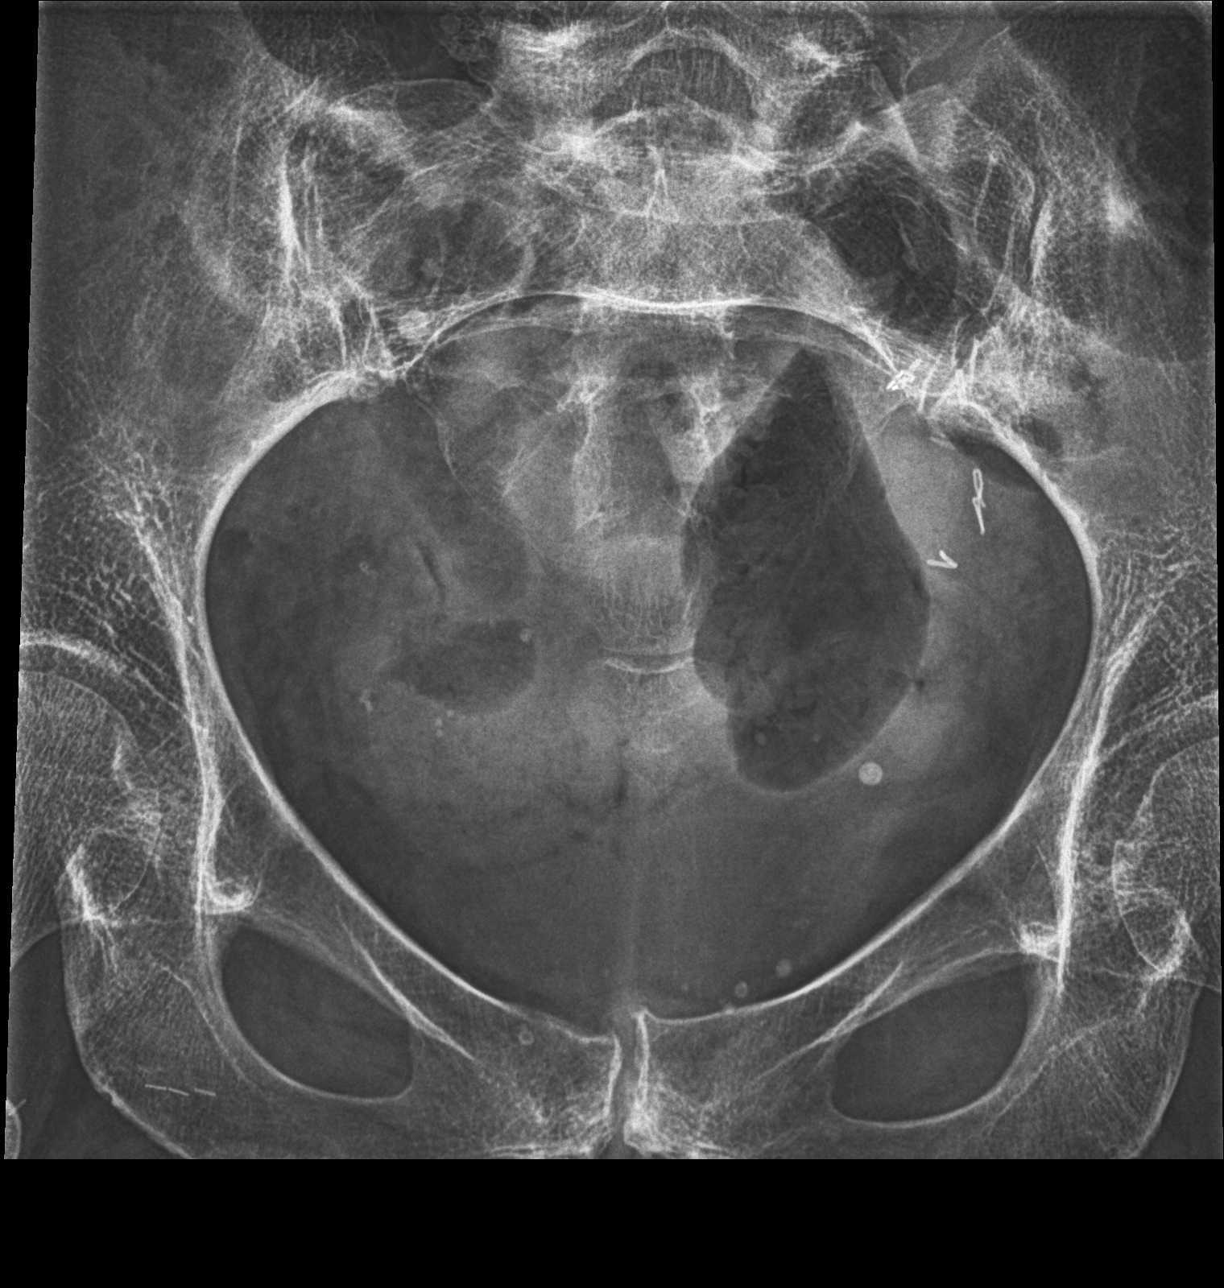

[3 of 3 positions shown; findings below may reference images not displayed]

FINDINGS: There is no evidence of fracture or other focal bone lesions. There
is diffuse osteopenia. Mild degenerative changes pubic symphysis.
IMPRESSION: No acute fracture or subluxation. Diffuse osteopenia. Mild
degenerative changes pubic symphysis.

## 2017-05-04 IMAGING — CR DG CERVICAL SPINE COMPLETE 4+V
6 series · 6 of 6 positions shown · non-contrast
Comparison: Head and cervical spine CT scans 06/29/2015.

CLINICAL DATA: Syncope and fall this morning with onset of neck
pain. Initial encounter.

EXAM:
CERVICAL SPINE - COMPLETE 4+ VIEW

[c-spine lat]
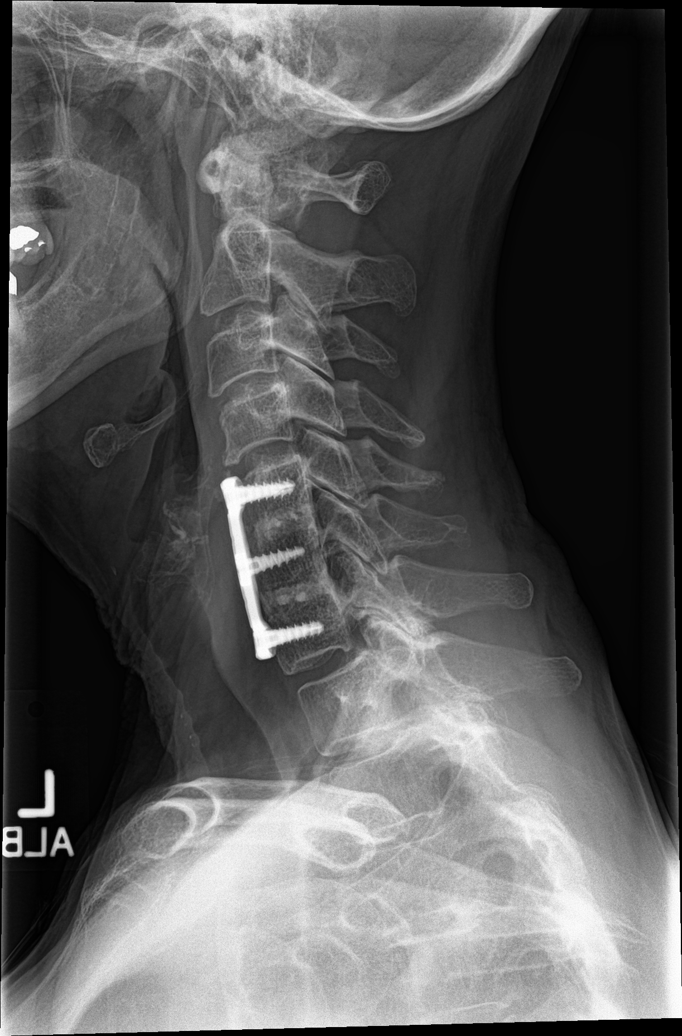

[c-spine obl (1 of 2)]
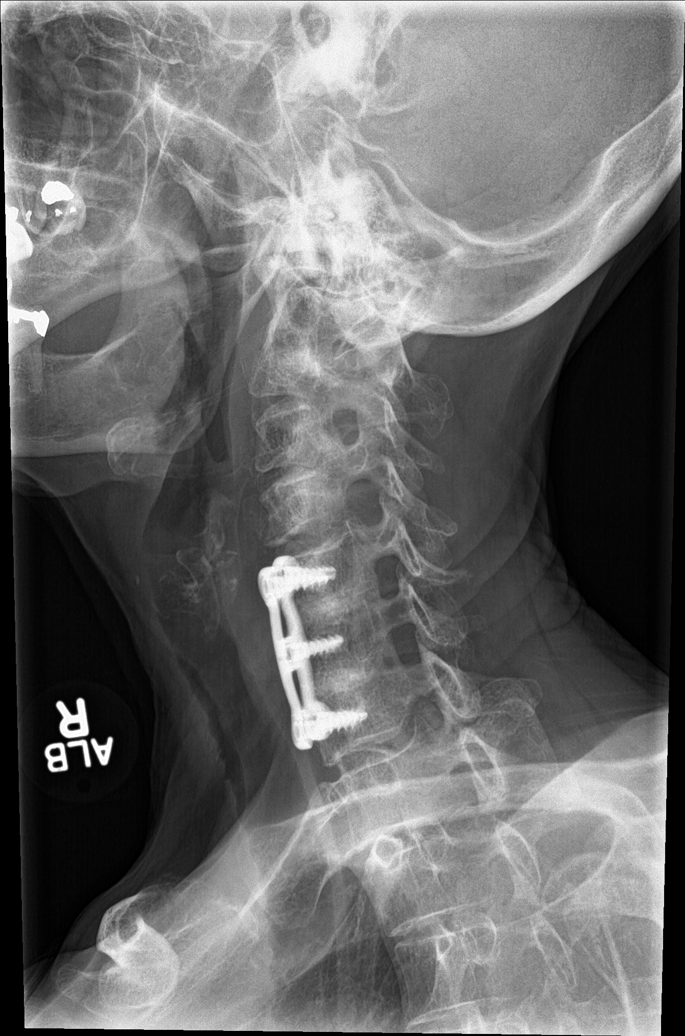

[c-spine obl (2 of 2)]
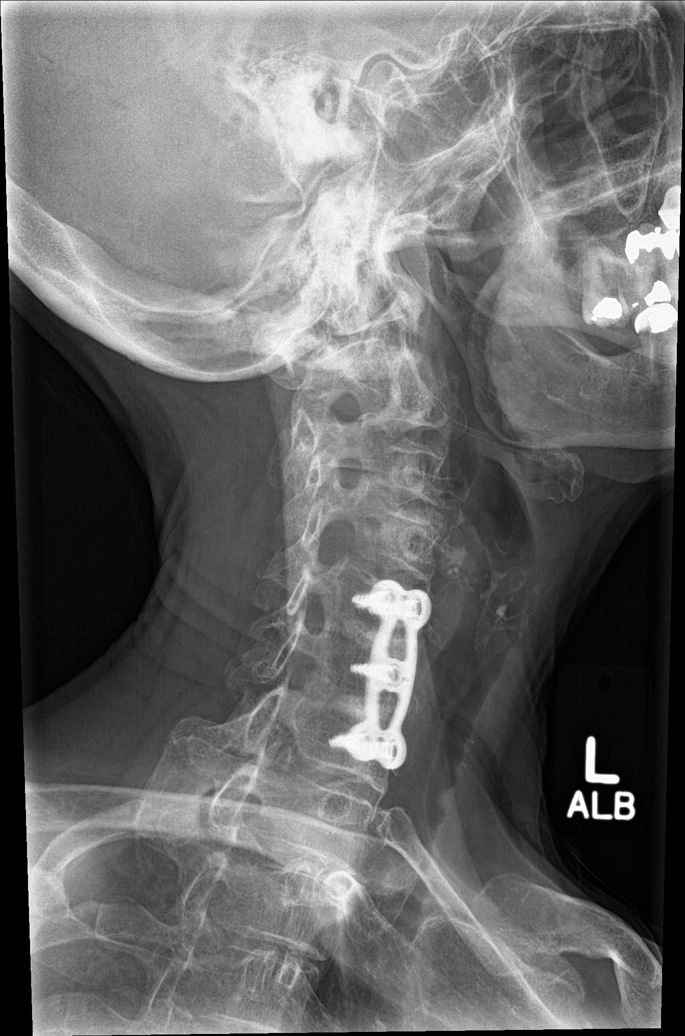

[c-spine ap]
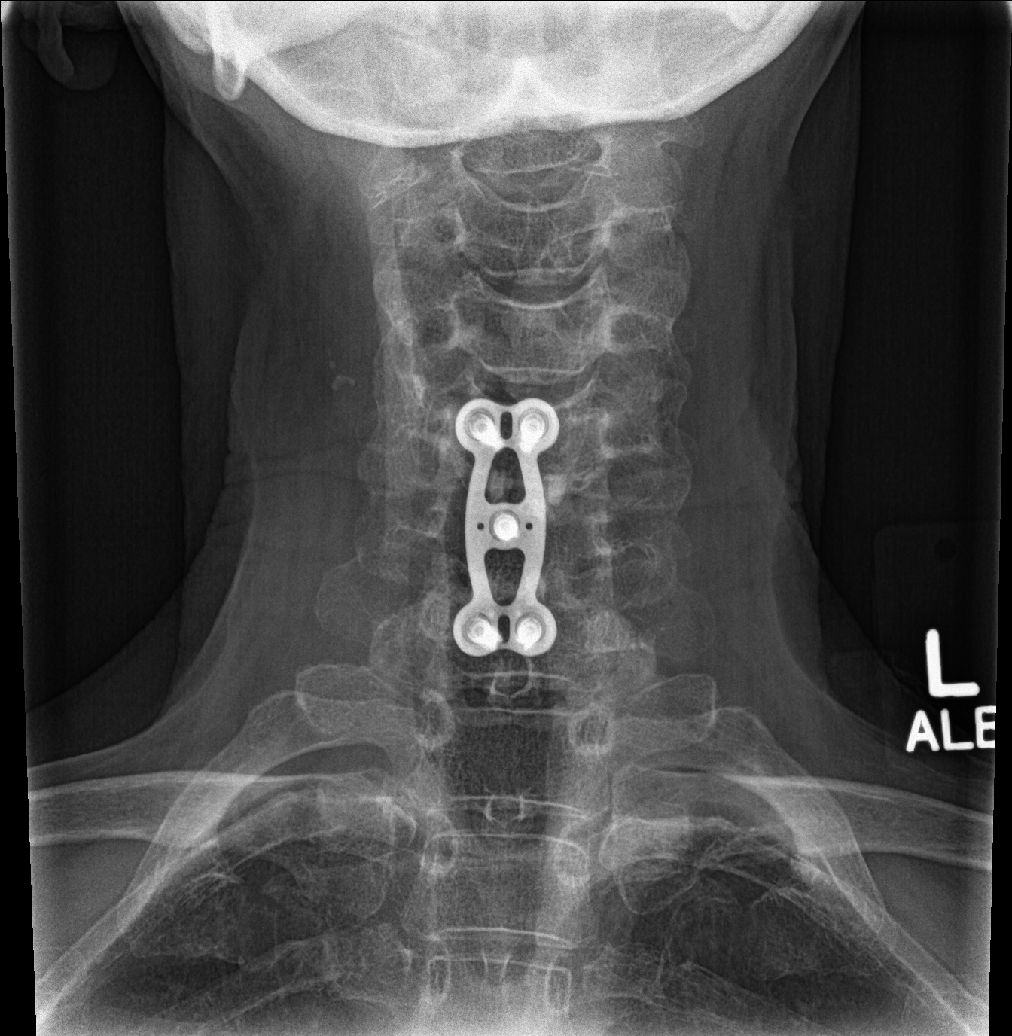

[c-spine open mouth (1 of 2)]
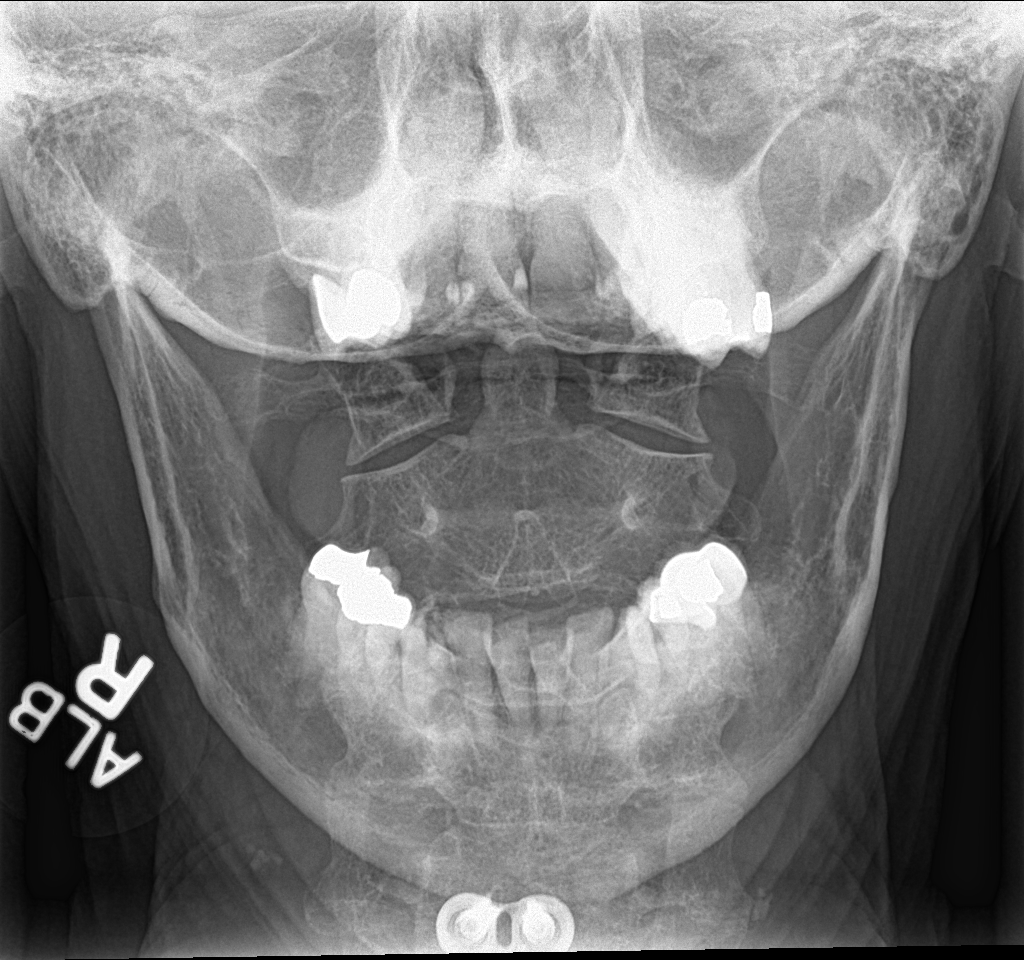

[c-spine open mouth (2 of 2)]
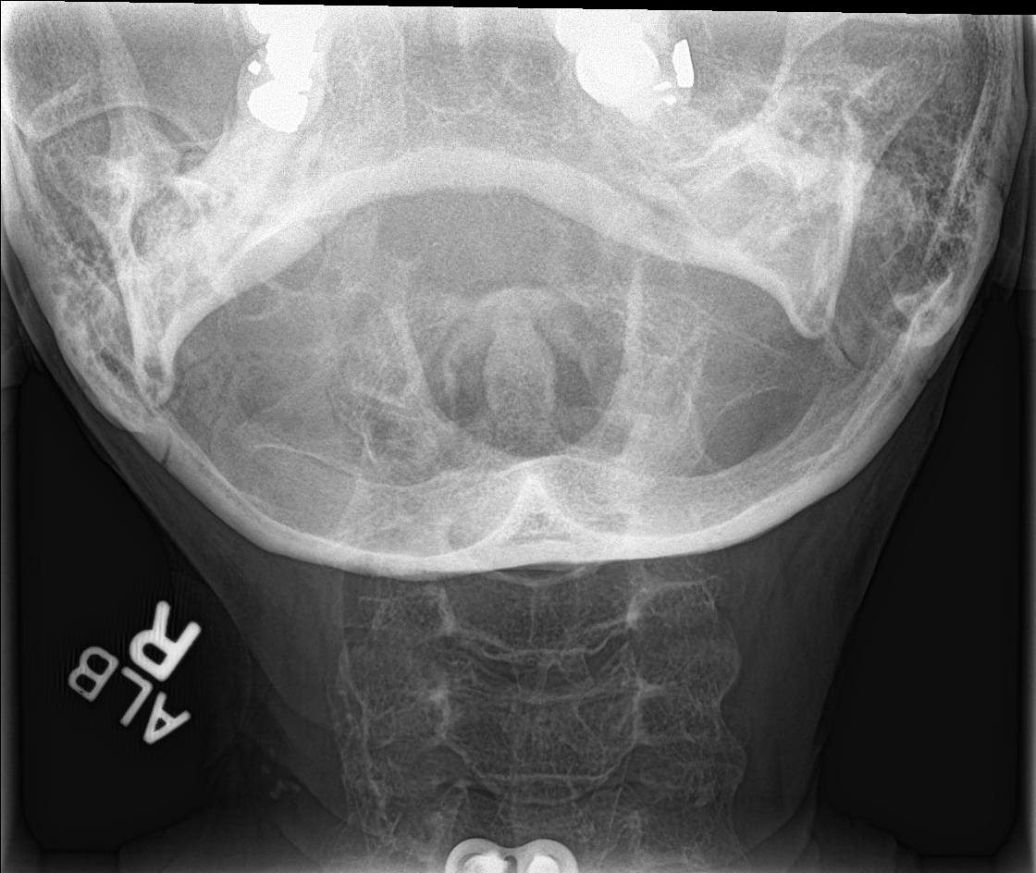

[6 of 6 positions shown; findings below may reference images not displayed]

FINDINGS: The patient is status post C5-7 ACDF. The levels are solidly fused.
Trace anterolisthesis C7 on T1 due to facet degenerative disease is
unchanged. Alignment is otherwise normal. No fracture is identified.
IMPRESSION: No acute abnormality.

Status post C5-7 ACDF.

## 2017-05-07 DIAGNOSIS — G8929 Other chronic pain: Secondary | ICD-10-CM | POA: Diagnosis not present

## 2017-05-07 DIAGNOSIS — M25562 Pain in left knee: Secondary | ICD-10-CM | POA: Diagnosis not present

## 2017-05-07 DIAGNOSIS — M1712 Unilateral primary osteoarthritis, left knee: Secondary | ICD-10-CM | POA: Diagnosis not present

## 2017-05-20 DIAGNOSIS — K5090 Crohn's disease, unspecified, without complications: Secondary | ICD-10-CM | POA: Diagnosis not present

## 2017-05-30 ENCOUNTER — Other Ambulatory Visit: Payer: Self-pay | Admitting: Internal Medicine

## 2017-05-30 MED ORDER — CLONAZEPAM 0.5 MG PO TABS: 0.5000 mg | ORAL_TABLET | Freq: Every day | ORAL | 0 refills | Status: DC | PRN

## 2017-05-30 NOTE — Telephone Encounter (Signed)
Change refill to 30 days only OFFICE VISIT NEEDED

## 2017-05-30 NOTE — Telephone Encounter (Signed)
Refilled: 05/01/2017 Last OV: 10/21/2016 Next OV: 06/11/2017

## 2017-05-30 NOTE — Telephone Encounter (Signed)
Printed, signed and faxed.  

## 2017-06-11 ENCOUNTER — Ambulatory Visit (INDEPENDENT_AMBULATORY_CARE_PROVIDER_SITE_OTHER): Payer: Medicare Other | Admitting: Internal Medicine

## 2017-06-11 ENCOUNTER — Encounter: Payer: Self-pay | Admitting: Internal Medicine

## 2017-06-11 VITALS — BP 100/58 | HR 92 | Temp 98.5°F | Resp 16 | Ht 62.0 in | Wt 111.2 lb

## 2017-06-11 DIAGNOSIS — R5383 Other fatigue: Secondary | ICD-10-CM | POA: Diagnosis not present

## 2017-06-11 DIAGNOSIS — Z0000 Encounter for general adult medical examination without abnormal findings: Secondary | ICD-10-CM | POA: Diagnosis not present

## 2017-06-11 DIAGNOSIS — E785 Hyperlipidemia, unspecified: Secondary | ICD-10-CM

## 2017-06-11 DIAGNOSIS — R636 Underweight: Secondary | ICD-10-CM

## 2017-06-11 DIAGNOSIS — Z1231 Encounter for screening mammogram for malignant neoplasm of breast: Secondary | ICD-10-CM | POA: Diagnosis not present

## 2017-06-11 DIAGNOSIS — E538 Deficiency of other specified B group vitamins: Secondary | ICD-10-CM | POA: Diagnosis not present

## 2017-06-11 DIAGNOSIS — Z720 Tobacco use: Secondary | ICD-10-CM | POA: Diagnosis not present

## 2017-06-11 DIAGNOSIS — M109 Gout, unspecified: Secondary | ICD-10-CM

## 2017-06-11 DIAGNOSIS — E559 Vitamin D deficiency, unspecified: Secondary | ICD-10-CM

## 2017-06-11 DIAGNOSIS — D751 Secondary polycythemia: Secondary | ICD-10-CM | POA: Diagnosis not present

## 2017-06-11 DIAGNOSIS — Z1239 Encounter for other screening for malignant neoplasm of breast: Secondary | ICD-10-CM

## 2017-06-11 LAB — CBC WITH DIFFERENTIAL/PLATELET
BASOS ABS: 0.1 10*3/uL (ref 0.0–0.1)
Basophils Relative: 0.8 % (ref 0.0–3.0)
EOS ABS: 0.1 10*3/uL (ref 0.0–0.7)
Eosinophils Relative: 1.3 % (ref 0.0–5.0)
HCT: 44.4 % (ref 36.0–46.0)
HEMOGLOBIN: 14.7 g/dL (ref 12.0–15.0)
Lymphocytes Relative: 44.3 % (ref 12.0–46.0)
Lymphs Abs: 4.4 10*3/uL — ABNORMAL HIGH (ref 0.7–4.0)
MCHC: 33.1 g/dL (ref 30.0–36.0)
MCV: 105.8 fl — ABNORMAL HIGH (ref 78.0–100.0)
MONO ABS: 0.9 10*3/uL (ref 0.1–1.0)
Monocytes Relative: 8.7 % (ref 3.0–12.0)
NEUTROS PCT: 44.9 % (ref 43.0–77.0)
Neutro Abs: 4.4 10*3/uL (ref 1.4–7.7)
Platelets: 314 10*3/uL (ref 150.0–400.0)
RBC: 4.2 Mil/uL (ref 3.87–5.11)
RDW: 13.1 % (ref 11.5–15.5)
WBC: 9.9 10*3/uL (ref 4.0–10.5)

## 2017-06-11 LAB — COMPREHENSIVE METABOLIC PANEL
ALBUMIN: 4.3 g/dL (ref 3.5–5.2)
ALT: 13 U/L (ref 0–35)
AST: 16 U/L (ref 0–37)
Alkaline Phosphatase: 46 U/L (ref 39–117)
BUN: 11 mg/dL (ref 6–23)
CALCIUM: 9.7 mg/dL (ref 8.4–10.5)
CHLORIDE: 104 meq/L (ref 96–112)
CO2: 27 meq/L (ref 19–32)
Creatinine, Ser: 0.77 mg/dL (ref 0.40–1.20)
GFR: 78.86 mL/min (ref >60.00)
Glucose, Bld: 93 mg/dL (ref 70–99)
POTASSIUM: 4.9 meq/L (ref 3.5–5.1)
Sodium: 138 mEq/L (ref 135–145)
Total Bilirubin: 0.3 mg/dL (ref 0.2–1.2)
Total Protein: 6.2 g/dL (ref 6.0–8.3)

## 2017-06-11 LAB — LIPID PANEL
CHOLESTEROL: 189 mg/dL (ref 0–200)
HDL: 67.2 mg/dL (ref >39.00)
LDL CALC: 87 mg/dL (ref 0–99)
NonHDL: 121.43
TRIGLYCERIDES: 172 mg/dL — AB (ref 0.0–149.0)
Total CHOL/HDL Ratio: 3
VLDL: 34.4 mg/dL (ref 0.0–40.0)

## 2017-06-11 LAB — IBC PANEL
IRON: 151 ug/dL — AB (ref 42–145)
SATURATION RATIOS: 33.4 % (ref 20.0–50.0)
TRANSFERRIN: 323 mg/dL (ref 212.0–360.0)

## 2017-06-11 LAB — VITAMIN B12: Vitamin B-12: 289 pg/mL (ref 211–911)

## 2017-06-11 LAB — URIC ACID: URIC ACID, SERUM: 3.5 mg/dL (ref 2.4–7.0)

## 2017-06-11 LAB — VITAMIN D 25 HYDROXY (VIT D DEFICIENCY, FRACTURES): VITD: 19.48 ng/mL — AB (ref 30.00–100.00)

## 2017-06-11 LAB — TSH: TSH: 1.74 u[IU]/mL (ref 0.35–4.50)

## 2017-06-11 MED ORDER — ZOSTER VAC RECOMB ADJUVANTED 50 MCG/0.5ML IM SUSR: 0.5000 mL | Freq: Once | INTRAMUSCULAR | 1 refills | Status: AC

## 2017-06-11 MED ORDER — TRAZODONE HCL 100 MG PO TABS: 100.0000 mg | ORAL_TABLET | Freq: Every day | ORAL | 0 refills | Status: DC

## 2017-06-11 MED ORDER — CLONAZEPAM 0.5 MG PO TABS: 0.5000 mg | ORAL_TABLET | Freq: Every day | ORAL | 5 refills | Status: DC | PRN

## 2017-06-11 NOTE — Patient Instructions (Addendum)
For your insomnia:  Increase the trazodone to 100 mg  And take it 30 minutes before bedtime  Do not drink any caffeine after 1 pm.  (get decaf mountain Dew!)   If you  Are still havign trouble ,  Let me know    The ShingRx vaccine is now available in local pharmacies and is much more protective thant Zostavaxs,  It is therefore ADVISED for all interested adults over 50 to prevent shingles   Your mammogram has been ordered for late August ,  We will call you with the appointment    Health Maintenance for Postmenopausal Women Menopause is a normal process in which your reproductive ability comes to an end. This process happens gradually over a span of months to years, usually between the ages of 66 and 15. Menopause is complete when you have missed 12 consecutive menstrual periods. It is important to talk with your health care provider about some of the most common conditions that affect postmenopausal women, such as heart disease, cancer, and bone loss (osteoporosis). Adopting a healthy lifestyle and getting preventive care can help to promote your health and wellness. Those actions can also lower your chances of developing some of these common conditions. What should I know about menopause? During menopause, you may experience a number of symptoms, such as:  Moderate-to-severe hot flashes.  Night sweats.  Decrease in sex drive.  Mood swings.  Headaches.  Tiredness.  Irritability.  Memory problems.  Insomnia.  Choosing to treat or not to treat menopausal changes is an individual decision that you make with your health care provider. What should I know about hormone replacement therapy and supplements? Hormone therapy products are effective for treating symptoms that are associated with menopause, such as hot flashes and night sweats. Hormone replacement carries certain risks, especially as you become older. If you are thinking about using estrogen or estrogen with progestin  treatments, discuss the benefits and risks with your health care provider. What should I know about heart disease and stroke? Heart disease, heart attack, and stroke become more likely as you age. This may be due, in part, to the hormonal changes that your body experiences during menopause. These can affect how your body processes dietary fats, triglycerides, and cholesterol. Heart attack and stroke are both medical emergencies. There are many things that you can do to help prevent heart disease and stroke:  Have your blood pressure checked at least every 1-2 years. High blood pressure causes heart disease and increases the risk of stroke.  If you are 55-57 years old, ask your health care provider if you should take aspirin to prevent a heart attack or a stroke.  Do not use any tobacco products, including cigarettes, chewing tobacco, or electronic cigarettes. If you need help quitting, ask your health care provider.  It is important to eat a healthy diet and maintain a healthy weight. ? Be sure to include plenty of vegetables, fruits, low-fat dairy products, and lean protein. ? Avoid eating foods that are high in solid fats, added sugars, or salt (sodium).  Get regular exercise. This is one of the most important things that you can do for your health. ? Try to exercise for at least 150 minutes each week. The type of exercise that you do should increase your heart rate and make you sweat. This is known as moderate-intensity exercise. ? Try to do strengthening exercises at least twice each week. Do these in addition to the moderate-intensity exercise.  Know your  numbers.Ask your health care provider to check your cholesterol and your blood glucose. Continue to have your blood tested as directed by your health care provider.  What should I know about cancer screening? There are several types of cancer. Take the following steps to reduce your risk and to catch any cancer development as early as  possible. Breast Cancer  Practice breast self-awareness. ? This means understanding how your breasts normally appear and feel. ? It also means doing regular breast self-exams. Let your health care provider know about any changes, no matter how small.  If you are 57 or older, have a clinician do a breast exam (clinical breast exam or CBE) every year. Depending on your age, family history, and medical history, it may be recommended that you also have a yearly breast X-ray (mammogram).  If you have a family history of breast cancer, talk with your health care provider about genetic screening.  If you are at high risk for breast cancer, talk with your health care provider about having an MRI and a mammogram every year.  Breast cancer (BRCA) gene test is recommended for women who have family members with BRCA-related cancers. Results of the assessment will determine the need for genetic counseling and BRCA1 and for BRCA2 testing. BRCA-related cancers include these types: ? Breast. This occurs in males or females. ? Ovarian. ? Tubal. This may also be called fallopian tube cancer. ? Cancer of the abdominal or pelvic lining (peritoneal cancer). ? Prostate. ? Pancreatic.  Cervical, Uterine, and Ovarian Cancer Your health care provider may recommend that you be screened regularly for cancer of the pelvic organs. These include your ovaries, uterus, and vagina. This screening involves a pelvic exam, which includes checking for microscopic changes to the surface of your cervix (Pap test).  For women ages 21-65, health care providers may recommend a pelvic exam and a Pap test every three years. For women ages 11-65, they may recommend the Pap test and pelvic exam, combined with testing for human papilloma virus (HPV), every five years. Some types of HPV increase your risk of cervical cancer. Testing for HPV may also be done on women of any age who have unclear Pap test results.  Other health care  providers may not recommend any screening for nonpregnant women who are considered low risk for pelvic cancer and have no symptoms. Ask your health care provider if a screening pelvic exam is right for you.  If you have had past treatment for cervical cancer or a condition that could lead to cancer, you need Pap tests and screening for cancer for at least 20 years after your treatment. If Pap tests have been discontinued for you, your risk factors (such as having a new sexual partner) need to be reassessed to determine if you should start having screenings again. Some women have medical problems that increase the chance of getting cervical cancer. In these cases, your health care provider may recommend that you have screening and Pap tests more often.  If you have a family history of uterine cancer or ovarian cancer, talk with your health care provider about genetic screening.  If you have vaginal bleeding after reaching menopause, tell your health care provider.  There are currently no reliable tests available to screen for ovarian cancer.  Lung Cancer Lung cancer screening is recommended for adults 37-4 years old who are at high risk for lung cancer because of a history of smoking. A yearly low-dose CT scan of the lungs is  recommended if you:  Currently smoke.  Have a history of at least 30 pack-years of smoking and you currently smoke or have quit within the past 15 years. A pack-year is smoking an average of one pack of cigarettes per day for one year.  Yearly screening should:  Continue until it has been 15 years since you quit.  Stop if you develop a health problem that would prevent you from having lung cancer treatment.  Colorectal Cancer  This type of cancer can be detected and can often be prevented.  Routine colorectal cancer screening usually begins at age 54 and continues through age 52.  If you have risk factors for colon cancer, your health care provider may recommend  that you be screened at an earlier age.  If you have a family history of colorectal cancer, talk with your health care provider about genetic screening.  Your health care provider may also recommend using home test kits to check for hidden blood in your stool.  A small camera at the end of a tube can be used to examine your colon directly (sigmoidoscopy or colonoscopy). This is done to check for the earliest forms of colorectal cancer.  Direct examination of the colon should be repeated every 5-10 years until age 8. However, if early forms of precancerous polyps or small growths are found or if you have a family history or genetic risk for colorectal cancer, you may need to be screened more often.  Skin Cancer  Check your skin from head to toe regularly.  Monitor any moles. Be sure to tell your health care provider: ? About any new moles or changes in moles, especially if there is a change in a mole's shape or color. ? If you have a mole that is larger than the size of a pencil eraser.  If any of your family members has a history of skin cancer, especially at a young age, talk with your health care provider about genetic screening.  Always use sunscreen. Apply sunscreen liberally and repeatedly throughout the day.  Whenever you are outside, protect yourself by wearing long sleeves, pants, a wide-brimmed hat, and sunglasses.  What should I know about osteoporosis? Osteoporosis is a condition in which bone destruction happens more quickly than new bone creation. After menopause, you may be at an increased risk for osteoporosis. To help prevent osteoporosis or the bone fractures that can happen because of osteoporosis, the following is recommended:  If you are 84-82 years old, get at least 1,000 mg of calcium and at least 600 mg of vitamin D per day.  If you are older than age 56 but younger than age 14, get at least 1,200 mg of calcium and at least 600 mg of vitamin D per day.  If you  are older than age 60, get at least 1,200 mg of calcium and at least 800 mg of vitamin D per day.  Smoking and excessive alcohol intake increase the risk of osteoporosis. Eat foods that are rich in calcium and vitamin D, and do weight-bearing exercises several times each week as directed by your health care provider. What should I know about how menopause affects my mental health? Depression may occur at any age, but it is more common as you become older. Common symptoms of depression include:  Low or sad mood.  Changes in sleep patterns.  Changes in appetite or eating patterns.  Feeling an overall lack of motivation or enjoyment of activities that you previously enjoyed.  Frequent crying spells.  Talk with your health care provider if you think that you are experiencing depression. What should I know about immunizations? It is important that you get and maintain your immunizations. These include:  Tetanus, diphtheria, and pertussis (Tdap) booster vaccine.  Influenza every year before the flu season begins.  Pneumonia vaccine.  Shingles vaccine.  Your health care provider may also recommend other immunizations. This information is not intended to replace advice given to you by your health care provider. Make sure you discuss any questions you have with your health care provider. Document Released: 12/20/2005 Document Revised: 05/17/2016 Document Reviewed: 08/01/2015 [employer] Interactive Patient Education  2018 [employer].

## 2017-06-11 NOTE — Progress Notes (Signed)
Patient ID: Tamara [name], female    DOB: [birth year]  Age: 71 y.o. MRN: [id number]  The patient is here for annual  preventive  examination and management of other chronic and acute problems.   The risk factors are reflected in the social history.  The roster of all physicians providing medical care to patient - is listed in the Snapshot section of the chart.  Activities of daily living:  The patient is 100% independent in all ADLs: dressing, toileting, feeding as well as independent mobility  Home safety : The patient has smoke detectors in the home. They wear seatbelts.  There are no firearms at home. There is no violence in the home.   There is no risks for hepatitis, STDs or HIV. There is no   history of blood transfusion. They have no travel history to infectious disease endemic areas of the world.  The patient has seen their dentist in the last six month. They have seen their eye doctor in the last year. They admit to slight hearing difficulty with regard to whispered voices and some television programs.  They have deferred audiologic testing in the last year.  They do not  have excessive sun exposure. Discussed the need for sun protection: hats, long sleeves and use of sunscreen if there is significant sun exposure.   Diet: the importance of a healthy diet is discussed. They do NOT have a healthy diet.  The benefits of regular aerobic exercise were discussed. She does not exercise due to OA    Depression screen: there are no signs or vegative symptoms of untreated depression- irritability, change in appetite, anhedonia, sadness/tearfullness.  Cognitive assessment: the patient manages all their financial and personal affairs and is actively engaged. They could relate day,date,year and events; recalled 2/3 objects at 3 minutes; performed clock-face test normally.  The following portions of the patient's history were reviewed and updated as appropriate: allergies, current medications,  past family history, past medical history,  past surgical history, past social history  and problem list.  Visual acuity was not assessed per patient preference since she has regular follow up with  stuil smoking 1/2 pack daly Sees Ortho for OA r ophthalmologist. Hearing and body mass index were assessed and reviewed.   During the course of the visit the patient was educated and counseled about appropriate screening and preventive services including : fall prevention , diabetes screening, nutrition counseling, colorectal cancer screening, and recommended immunizations.    CC: The primary encounter diagnosis was B12 deficiency. Diagnoses of Uric acid arthropathy, Vitamin D deficiency, Underweight, Polycythemia, secondary, Fatigue, unspecified type, Hyperlipidemia LDL goal <130, Breast cancer screening, Tobacco abuse, and Encounter for preventive health examination were also pertinent to this visit.   Chronic insomnia, since husband died a year ago.  Goes to bed at 11:00  ,  Awake at 1:00 am or 3 am and cannot go back to sleep.  Sleepy during the day but does not lie down for nap,  Does not work except in the yard.  Takes trazodone  50 mg .  Takes clonazepam 0.5 mg at bedtime as well  At 11:00 .  Does not use lap top or phone after 8:00 pm .  Drinks diet mountain dew every afternoon,  Last one at 7 pm.    Treated for n/v secondary to obstipation (history of Crohn's) by Tiffany Kocher in early June with omeprazole and tagamet.   still smoking 1/2 pack daily, now up to 1 pack daily [place] lie  to take Chantix due to insurance non coverage.  patchest don't help   No longer taking meloxicam    Treated by ortho for left kneepain . Was taking motrin (synvsc 2014) ,. Received steroid I/A injection and rx for mobic 7.5 mg June 27 .  Did not help,    Seeing neurosurgery for spinal stenosis  S/p L40-5 lumbar decompression done in February  [name] has a past medical history of Bowel obstruction ([place]);  Chronic abdominal pain; COPD (chronic obstructive pulmonary disease) ([place]); Crohn's disease ([place]); Depression; History of DVT (deep vein thrombosis) (01/2007); and Osteoporosis.   She has a past surgical history that includes Cholecystectomy; Cervical fusion (2005); Splenectomy; Rotator cuff repair; Appendectomy; gyn surgery; laparotomy (01/30/07); Cataract extraction; Nissen fundoplication; Abdominal hysterectomy; Tonsillectomy; and Lumbar laminectomy/decompression microdiscectomy (N/A, 12/25/2016).   Her family history includes Coronary artery disease in her father; Diabetes in her mother; Heart disease in her mother; Hypertension in her mother.She reports that she has been smoking Cigarettes.  She has been smoking about 1.00 pack per day. She has never used smokeless tobacco. She reports that she does not drink alcohol or use drugs.  Outpatient Medications Prior to Visit  Medication Sig Dispense Refill  . aspirin EC 81 MG tablet Take 81 mg by mouth every evening.    . gabapentin (NEURONTIN) 300 MG capsule TAKE 1 CAPSULE BY MOUTH ONCE DAILY 90 capsule 1  . HYDROcodone-acetaminophen (NORCO) 7.5-325 MG tablet Take 1 tablet by mouth every 6 (six) hours as needed for moderate pain. 90 tablet 0  . nortriptyline (PAMELOR) 25 MG capsule TAKE 1 CAPSULE BY MOUTH AT BEDTIME 90 capsule 1  . sertraline (ZOLOFT) 100 MG tablet Take 1 tablet (100 mg total) by mouth daily. 30 tablet 5  . clonazePAM (KLONOPIN) 0.5 MG tablet Take 1 tablet (0.5 mg total) by mouth daily as needed for anxiety. 30 tablet 0  . traZODone (DESYREL) 50 MG tablet Take 1 tablet (50 mg total) by mouth at bedtime. 90 tablet 1  . Cyanocobalamin 1000 MCG SUBL Place 1 tablet (1,000 mcg total) under the tongue daily. (Patient not taking: Reported on 06/11/2017) 90 tablet 3  . methocarbamol (ROBAXIN) 500 MG tablet Take 1 tablet (500 mg total) by mouth every 6 (six) hours as needed for muscle spasms. (Patient not taking: Reported on 06/11/2017) 90 tablet 0   . nicotine (NICODERM CQ) 21 mg/24hr patch Place 1 patch (21 mg total) onto the skin daily. (Patient not taking: Reported on 12/13/2016) 28 patch 0  . predniSONE (DELTASONE) 10 MG tablet 6 tablets on Day 1 , then reduce by 1 tablet daily until gone (Patient not taking: Reported on 12/13/2016) 21 tablet 0  . varenicline (CHANTIX STARTING MONTH PAK) 0.5 MG X 11 & 1 MG X 42 tablet Take one 0.5 mg tablet by mouth once daily for 3 days, then increase to one 0.5 mg tablet twice daily for 4 days, then increase to one 1 mg tablet twice daily. (Patient not taking: Reported on 12/13/2016) 53 tablet 0   No facility-administered medications prior to visit.     Review of Systems   Patient denies headache, fevers, malaise, unintentional weight loss, skin rash, eye pain, sinus congestion and sinus pain, sore throat, dysphagia,  hemoptysis , cough, dyspnea, wheezing, chest pain, palpitations, orthopnea, edema, abdominal pain, nausea, melena, diarrhea, constipation, flank pain, dysuria, hematuria, urinary  Frequency, nocturia, numbness, tingling, seizures,  Focal weakness, Loss of consciousness,  Tremor,  and suicidal ideation.  Objective:  BP (!) 100/58 (BP Location: Left Arm, Patient Position: Sitting, Cuff Size: Normal)   Pulse 92   Temp 98.5 F (36.9 C) (Oral)   Resp 16   Ht 5' 2"  (1.575 m)   Wt 111 lb 3.2 oz (50.4 kg)   SpO2 94%   BMI 20.34 kg/m   Physical Exam   General appearance: alert, cooperative and appears stated age Head: Normocephalic, without obvious abnormality, atraumatic Eyes: conjunctivae/corneas clear. PERRL, EOM's intact. Fundi benign. Ears: normal TM's and external ear canals both ears Nose: Nares normal. Septum midline. Mucosa normal. No drainage or sinus tenderness. Throat: lips, mucosa, and tongue normal; teeth and gums normal Neck: no adenopathy, no carotid bruit, no JVD, supple, symmetrical, trachea midline and thyroid not enlarged, symmetric, no  tenderness/mass/nodules Lungs: clear to auscultation bilaterally Breasts: normal appearance, no masses or tenderness Heart: regular rate and rhythm, S1, S2 normal, no murmur, click, rub or gallop Abdomen: soft, non-tender; bowel sounds normal; no masses,  no organomegaly Extremities: extremities normal, atraumatic, no cyanosis or edema Pulses: 2+ and symmetric Skin: Skin color, texture, turgor normal. No rashes or lesions Neurologic: Alert and oriented X 3, normal strength and tone. Normal symmetric reflexes. Normal coordination and gait.      Assessment & Plan:   Problem List Items Addressed This Visit    Vitamin D deficiency    Recurrent,  Will continue weekly vitamin d 50,000 IUs      Relevant Orders   VITAMIN D 25 Hydroxy (Vit-D Deficiency, Fractures) (Completed)   Uric acid arthropathy   Relevant Orders   Uric acid (Completed)   Underweight     I have reviewed her diet and recommended that she increase her protein and fat intake while monitoring her carbohydrates.       Tobacco abuse    She continues to smoke despite the effects on her general health  Smoking cessation instruction/counseling given:  Spent 3 minutes discussing risk of continued tobacco abuse, including but not limited to CAD, PAD, hypertension, and CA.  She is not interested in pharmacotherapy at this time.      Polycythemia, secondary    Resolved currently.   Lab Results  Component Value Date   WBC 9.9 06/11/2017   HGB 14.7 06/11/2017   HCT 44.4 06/11/2017   MCV 105.8 (H) 06/11/2017   PLT 314.0 06/11/2017         Relevant Orders   CBC with Differential/Platelet (Completed)   Encounter for preventive health examination    Annual comprehensive preventive exam was done as well as an evaluation and management of chronic conditions .  During the course of the visit the patient was educated and counseled about appropriate screening and preventive services including :  diabetes screening, lipid analysis  with projected  10 year  risk for CAD , nutrition counseling, breast, cervical and colorectal cancer screening, and recommended immunizations.  Printed recommendations for health maintenance screenings was given      B12 deficiency - Primary   Relevant Orders   Vitamin B12 (Completed)    Other Visit Diagnoses    Fatigue, unspecified type       Relevant Orders   Comprehensive metabolic panel (Completed)   IBC panel (Completed)   TSH (Completed)   Hyperlipidemia LDL goal <130       Relevant Orders   Lipid panel (Completed)   Breast cancer screening       Relevant Orders   MM DIGITAL SCREENING BILATERAL  I have discontinued Ms. Melland's Cyanocobalamin, varenicline, nicotine, predniSONE, and methocarbamol. I have also changed her traZODone. Additionally, I am having her start on Zoster Vac Recomb Adjuvanted and ergocalciferol. Lastly, I am having her maintain her HYDROcodone-acetaminophen, aspirin EC, gabapentin, nortriptyline, sertraline, and clonazePAM.  Meds ordered this encounter  Medications  . traZODone (DESYREL) 100 MG tablet    Sig: Take 1 tablet (100 mg total) by mouth at bedtime.    Dispense:  30 tablet    Refill:  0  . Zoster Vac Recomb Adjuvanted (SHINGRIX) injection    Sig: Inject 0.5 mLs into the muscle once.    Dispense:  1 each    Refill:  1  . clonazePAM (KLONOPIN) 0.5 MG tablet    Sig: Take 1 tablet (0.5 mg total) by mouth daily as needed for anxiety.    Dispense:  30 tablet    Refill:  5  . ergocalciferol (DRISDOL) 50000 units capsule    Sig: Take 1 capsule (50,000 Units total) by mouth once a week.    Dispense:  4 capsule    Refill:  11    Medications Discontinued During This Encounter  Medication Reason  . Cyanocobalamin 1000 MCG SUBL Patient has not taken in last 30 days  . methocarbamol (ROBAXIN) 500 MG tablet Patient has not taken in last 30 days  . nicotine (NICODERM CQ) 21 mg/24hr patch Patient has not taken in last 30 days  . predniSONE  (DELTASONE) 10 MG tablet Patient has not taken in last 30 days  . varenicline (CHANTIX STARTING MONTH PAK) 0.5 MG X 11 & 1 MG X 42 tablet Patient has not taken in last 30 days  . traZODone (DESYREL) 50 MG tablet Reorder  . clonazePAM (KLONOPIN) 0.5 MG tablet Reorder    Follow-up: Return in about 6 months (around 12/12/2017).   Crecencio Mc, MD

## 2017-06-12 MED ORDER — ERGOCALCIFEROL 1.25 MG (50000 UT) PO CAPS: 50000.0000 [IU] | ORAL_CAPSULE | ORAL | 11 refills | Status: DC

## 2017-06-12 NOTE — Assessment & Plan Note (Signed)
Recurrent,  Will continue weekly vitamin d 50,000 IUs

## 2017-06-12 NOTE — Assessment & Plan Note (Signed)
Annual comprehensive preventive exam was done as well as an evaluation and management of chronic conditions .  During the course of the visit the patient was educated and counseled about appropriate screening and preventive services including :  diabetes screening, lipid analysis with projected  10 year  risk for CAD , nutrition counseling, breast, cervical and colorectal cancer screening, and recommended immunizations.  Printed recommendations for health maintenance screenings was given 

## 2017-06-12 NOTE — Assessment & Plan Note (Signed)
I have reviewed her diet and recommended that she increase her protein and fat intake while monitoring her carbohydrates.

## 2017-06-12 NOTE — Assessment & Plan Note (Signed)
Resolved currently.   Lab Results  Component Value Date   WBC 9.9 06/11/2017   HGB 14.7 06/11/2017   HCT 44.4 06/11/2017   MCV 105.8 (H) 06/11/2017   PLT 314.0 06/11/2017

## 2017-06-12 NOTE — Assessment & Plan Note (Addendum)
She continues to smoke despite the effects on her general health  Smoking cessation instruction/counseling given:  Spent 3 minutes discussing risk of continued tobacco abuse, including but not limited to CAD, PAD, hypertension, and CA.  She is not interested in pharmacotherapy at this time.

## 2017-06-14 ENCOUNTER — Other Ambulatory Visit: Payer: Self-pay | Admitting: Internal Medicine

## 2017-06-17 ENCOUNTER — Encounter: Payer: Self-pay | Admitting: Internal Medicine

## 2017-06-18 ENCOUNTER — Encounter: Payer: Self-pay | Admitting: Internal Medicine

## 2017-06-19 ENCOUNTER — Ambulatory Visit (INDEPENDENT_AMBULATORY_CARE_PROVIDER_SITE_OTHER): Payer: Medicare Other

## 2017-06-19 ENCOUNTER — Ambulatory Visit
Admission: EM | Admit: 2017-06-19 | Discharge: 2017-06-19 | Disposition: A | Discharge status: Home / Self Care | Payer: Medicare Other | Attending: Family Medicine | Admitting: Family Medicine

## 2017-06-19 DIAGNOSIS — M25562 Pain in left knee: Secondary | ICD-10-CM

## 2017-06-19 DIAGNOSIS — M79605 Pain in left leg: Secondary | ICD-10-CM

## 2017-06-19 DIAGNOSIS — W19XXXA Unspecified fall, initial encounter: Secondary | ICD-10-CM | POA: Diagnosis not present

## 2017-06-19 DIAGNOSIS — S7012XA Contusion of left thigh, initial encounter: Secondary | ICD-10-CM

## 2017-06-19 DIAGNOSIS — S8992XA Unspecified injury of left lower leg, initial encounter: Secondary | ICD-10-CM | POA: Diagnosis not present

## 2017-06-19 MED ORDER — HYDROCODONE-ACETAMINOPHEN 5-325 MG PO TABS: 1.0000 | ORAL_TABLET | Freq: Three times a day (TID) | ORAL | 0 refills | Status: DC | PRN

## 2017-06-19 MED ORDER — KETOROLAC TROMETHAMINE 60 MG/2ML IM SOLN
60.0000 mg | Freq: Once | INTRAMUSCULAR | Status: AC
  Administered 2017-06-19: 60 mg via INTRAMUSCULAR

## 2017-06-19 NOTE — ED Triage Notes (Addendum)
Pt fell on uneven ground onTuesday and is having a lot of pain on the outside of her left knee. No swelling, or bruising is present at this time.

## 2017-06-19 NOTE — ED Provider Notes (Signed)
[facility]    CSN: [id number] Arrival date & time: 06/19/17  5643     History   Chief Complaint Chief Complaint  Patient presents with  . Leg Pain    HPI Tamara [name] is a 71 y.o. female.   Patient is a 71 year old white female who's had knee injections as well as to get as she puts it gel injections into the knee as well and she states that she fell about 2-3 days ago she was sawing down a rather large tree in a tree caught the saw as she pulled on the sauce she fell. She denies any loss of consciousness but landed on her left knee where she had a cortisone injection about a month ago by Dr. Leanor Kail. She states when she ambulates the leg hurts and is causing more pain. She called his office and they sent her a recommend she come over here to be evaluated and x-rays of the knee. Should be noted that patient complains of pain in the knee but she has bruising all along her leg as well as her face and on the arms as well. She smokes. She denies any chronic medical problems but her chart shows she has COPD history of DVT and osteoporosis as well as Crohn's disease. She had appendectomy abdominal hysterectomy cervical fusion cholecystectomy multiple orthopedic surgery and Nissen fundoplication surgery in the past as well. Family history is positive for diabetes hypertension and coronary artery disease.   The history is provided by the patient. No language interpreter was used.  Leg Pain  Location:  Leg Time since incident:  3 days Injury: yes   Leg location:  L lower leg and L upper leg Pain details:    Radiates to:  LUQ and LLQ   Severity:  Moderate   Onset quality:  Sudden   Timing:  Constant   Progression:  Worsening Chronicity:  New Dislocation: no   Prior injury to area:  Yes Relieved by:  Acetaminophen and NSAIDs Worsened by:  Bearing weight and activity Ineffective treatments:  NSAIDs Risk factors: no frequent fractures and no known bone disorder      Past Medical History:  Diagnosis Date  . Bowel obstruction ([place])    s/p laparotomy/ LOA 3/08, prior reversal of jejunal bypasss 2004  . Chronic abdominal pain   . COPD (chronic obstructive pulmonary disease) ([place])   . Crohn's disease ([place])   . Depression   . History of DVT (deep vein thrombosis) 01/2007   post operative right leg, s/p vena cava filter  . Osteoporosis     Patient Active Problem List   Diagnosis Date Noted  . Spinal stenosis, lumbar region, with neurogenic claudication 11/08/2016  . Polycythemia, secondary 10/24/2016  . Sciatic leg pain 10/22/2016  . Head injury 08/26/2016  . Medicare annual wellness visit, subsequent 04/16/2016  . Underweight 08/27/2015  . Vitamin D deficiency 08/27/2015  . Stenosis of artery ([place])   . Amaurosis fugax 12/13/2014  . Uric acid arthropathy 10/08/2014  . Carpal tunnel syndrome 03/08/2014  . Cough 03/08/2014  . B12 deficiency 01/26/2014  . Tobacco abuse 01/26/2014  . Encounter for preventive health examination 05/11/2013  . Osteoporosis, unspecified 11/19/2011  . Chronic abdominal pain   . H/O small bowel obstruction   . Crohn's disease ([place])   . History of DVT (deep vein thrombosis) 01/10/2007    Past Surgical History:  Procedure Laterality Date  . ABDOMINAL HYSTERECTOMY    . APPENDECTOMY    .  CATARACT EXTRACTION    . CERVICAL FUSION  2005   C6-7, anterior approach  . CHOLECYSTECTOMY    . gyn surgery     hysterectomy  . LAPAROTOMY  01/30/07   for bowel obstruction with LOA  . LUMBAR LAMINECTOMY/DECOMPRESSION MICRODISCECTOMY N/A 12/25/2016   Procedure: LUMBAR DECOMPRESSION L4-5;  Surgeon: Meade Maw, MD;  Location: [facility] ORS;  Service: Neurosurgery;  Laterality: N/A;  . NISSEN FUNDOPLICATION    . ROTATOR CUFF REPAIR     right  . SPLENECTOMY     secondary to colonoscopy prep  . TONSILLECTOMY      OB History    No data available       Home Medications    Prior to Admission medications   Medication  Sig Start Date End Date Taking? Authorizing Provider  aspirin EC 81 MG tablet Take 81 mg by mouth every evening.   Yes [provider]  clonazePAM (KLONOPIN) 0.5 MG tablet Take 1 tablet (0.5 mg total) by mouth daily as needed for anxiety. 06/11/17  Yes Crecencio Mc, MD  ergocalciferol (DRISDOL) 50000 units capsule Take 1 capsule (50,000 Units total) by mouth once a week. 06/12/17  Yes Crecencio Mc, MD  gabapentin (NEURONTIN) 300 MG capsule TAKE 1 CAPSULE BY MOUTH ONCE DAILY 03/03/17  Yes Crecencio Mc, MD  sertraline (ZOLOFT) 100 MG tablet Take 1 tablet (100 mg total) by mouth daily. 05/02/17  Yes Crecencio Mc, MD  HYDROcodone-acetaminophen (NORCO) 5-325 MG tablet Take 1 tablet by mouth every 8 (eight) hours as needed for moderate pain. 06/19/17   Frederich Cha, MD  HYDROcodone-acetaminophen (NORCO) 7.5-325 MG tablet Take 1 tablet by mouth every 6 (six) hours as needed for moderate pain. 11/12/16   Crecencio Mc, MD  nortriptyline (PAMELOR) 25 MG capsule TAKE 1 CAPSULE BY MOUTH AT BEDTIME 05/01/17   Crecencio Mc, MD  traZODone (DESYREL) 100 MG tablet Take 1 tablet (100 mg total) by mouth at bedtime. 06/11/17   Crecencio Mc, MD    Family History Family History  Problem Relation Age of Onset  . Diabetes Mother        type 2  . Heart disease Mother   . Hypertension Mother   . Coronary artery disease Father     Social History Social History  Substance Use Topics  . Smoking status: Current Every Day Smoker    Packs/day: 1.00    Types: Cigarettes    Last attempt to quit: 12/13/2014  . Smokeless tobacco: Never Used     Comment: half to one pack of cigs per day  . Alcohol use No     Allergies   Sulfa antibiotics   Review of Systems Review of Systems  Musculoskeletal: Positive for gait problem and myalgias.  Hematological: Bruises/bleeds easily.  All other systems reviewed and are negative.    Physical Exam Triage Vital Signs ED Triage Vitals  Enc Vitals Group      BP 06/19/17 0849 110/61     Pulse Rate 06/19/17 0849 (!) 105     Resp 06/19/17 0849 18     Temp 06/19/17 0849 98 F (36.7 C)     Temp Source 06/19/17 0849 Oral     SpO2 06/19/17 0849 97 %     Weight 06/19/17 0848 111 lb (50.3 kg)     Height 06/19/17 0848 5' 2"  (1.575 m)     Head Circumference --      Peak Flow --  Pain Score 06/19/17 0849 9     Pain Loc --      Pain Edu? --      Excl. in [place]? --    No data found.   Updated Vital Signs BP 110/61 (BP Location: Right Arm)   Pulse (!) 105   Temp 98 F (36.7 C) (Oral)   Resp 18   Ht 5' 2"  (1.575 m)   Wt 111 lb (50.3 kg)   SpO2 97%   BMI 20.30 kg/m   Visual Acuity Right Eye Distance:   Left Eye Distance:   Bilateral Distance:    Right Eye Near:   Left Eye Near:    Bilateral Near:     Physical Exam  Constitutional: She is oriented to person, place, and time. She appears well-developed and well-nourished.  HENT:  Head: Normocephalic. Head is with contusion.    Right Ear: External ear normal.  Left Ear: External ear normal.  Mouth/Throat: Oropharynx is clear and moist.  Eyes: Pupils are equal, round, and reactive to light. EOM are normal.  Neck: Normal range of motion. Neck supple. No tracheal deviation present.  Pulmonary/Chest: Breath sounds normal.  Musculoskeletal: Normal range of motion. She exhibits tenderness.       Left upper leg: She exhibits tenderness and swelling.       Legs: Patient has multiple bruises  Lymphadenopathy:    She has no cervical adenopathy.  Neurological: She is alert and oriented to person, place, and time.  Skin: Skin is warm. There is erythema.  Psychiatric: She has a normal mood and affect.  Vitals reviewed.    UC Treatments / Results  Labs (all labs ordered are listed, but only abnormal results are displayed) Labs Reviewed - No data to display  EKG  EKG Interpretation None       Radiology Dg Tibia/fibula Left  Result Date: 06/19/2017 CLINICAL DATA:  Left leg  pain after fall 2 days ago. EXAM: LEFT TIBIA AND FIBULA - 2 VIEW COMPARISON:  None. FINDINGS: There is no evidence of fracture or other focal bone lesions. Soft tissues are unremarkable. IMPRESSION: Normal left tibia and fibula. Electronically Signed   By: Marijo Conception, M.D.   On: 06/19/2017 10:22   Dg Knee Complete 4 Views Left  Result Date: 06/19/2017 CLINICAL DATA:  Left knee pain after fall 2 days ago. EXAM: LEFT KNEE - COMPLETE 4+ VIEW COMPARISON:  Radiographs of May 03, 2009. FINDINGS: No evidence of fracture, dislocation, or joint effusion. No significant joint space narrowing is noted. Chondrocalcinosis is noted medially and laterally. Soft tissues are unremarkable. IMPRESSION: Chondrocalcinosis is noted which may represent calcium pyrophosphate deposition disease or possibly early degenerative change. No acute abnormality seen in the left knee. Electronically Signed   By: Marijo Conception, M.D.   On: 06/19/2017 10:15   Dg Femur Min 2 Views Left  Result Date: 06/19/2017 CLINICAL DATA:  Left leg pain after fall 2 days ago. EXAM: LEFT FEMUR 2 VIEWS COMPARISON:  None. FINDINGS: There is no evidence of fracture or other focal bone lesions. Soft tissues are unremarkable. IMPRESSION: Normal left femur. Electronically Signed   By: Marijo Conception, M.D.   On: 06/19/2017 10:21    Procedures Procedures (including critical care time)  Medications Ordered in UC Medications  ketorolac (TORADOL) injection 60 mg (60 mg Intramuscular Given 06/19/17 0931)     Initial Impression / Assessment and Plan / UC Course  I have reviewed the triage vital signs and the  nursing notes.  Pertinent labs & imaging results that were available during my care of the patient were reviewed by me and considered in my medical decision making (see chart for details).   patient requests pain medication woman assisted Toradol IM. She was not the 245 as far as narcotics stating that she doesn't use them but I see that she did  have a prescription January. Will check the drug reporting site to make sure there is no problem. I'm recommending x-raying her femur lower leg and her knee but she is insistent that she is wants her knee done we'll see if she'll allow [place] to do the other x-rays she is tender up in her thigh and in her lower leg as well. She has bruising on her face and on her arms but she states t But she is not having pain in those areas or problems  In reviewing the x-rays all were negative reporting site did not have the last Norco prescription in January 2018 recorded cirrhosis is been no narcotics since January 2018 documented will give her 15 Norco 5-25 tablets use for pain. Dr. Jefm Bryant on about 5-7 days for follow-up as needed Final Clinical Impressions(s) / UC Diagnoses   Final diagnoses:  Contusion of left thigh, initial encounter  Pain of left lower extremity    New Prescriptions New Prescriptions   HYDROCODONE-ACETAMINOPHEN (NORCO) 5-325 MG TABLET    Take 1 tablet by mouth every 8 (eight) hours as needed for moderate pain.     Controlled Substance Prescriptions [place] Controlled Substance Registry consulted? Yes, I have consulted the [place] Controlled Substances Registry for this patient, and feel the risk/benefit ratio today is favorable for proceeding with this prescription for a controlled substance.   Frederich Cha, MD 06/19/17 1049

## 2017-06-24 DIAGNOSIS — M810 Age-related osteoporosis without current pathological fracture: Secondary | ICD-10-CM | POA: Diagnosis not present

## 2017-06-24 DIAGNOSIS — M1712 Unilateral primary osteoarthritis, left knee: Secondary | ICD-10-CM | POA: Insufficient documentation

## 2017-07-04 ENCOUNTER — Telehealth: Payer: Self-pay | Admitting: Internal Medicine

## 2017-07-04 NOTE — Telephone Encounter (Signed)
Called pt to schedule AWV. Phone kept ringing and no voicemail.  Last AWV 04/15/16  [facility] call back # [phone]

## 2017-07-08 DIAGNOSIS — L811 Chloasma: Secondary | ICD-10-CM | POA: Diagnosis not present

## 2017-07-08 DIAGNOSIS — L821 Other seborrheic keratosis: Secondary | ICD-10-CM | POA: Diagnosis not present

## 2017-07-08 DIAGNOSIS — L570 Actinic keratosis: Secondary | ICD-10-CM | POA: Diagnosis not present

## 2017-07-22 DIAGNOSIS — R1084 Generalized abdominal pain: Secondary | ICD-10-CM | POA: Diagnosis not present

## 2017-07-22 DIAGNOSIS — K2950 Unspecified chronic gastritis without bleeding: Secondary | ICD-10-CM | POA: Diagnosis not present

## 2017-07-22 DIAGNOSIS — K3189 Other diseases of stomach and duodenum: Secondary | ICD-10-CM | POA: Diagnosis not present

## 2017-07-22 DIAGNOSIS — R110 Nausea: Secondary | ICD-10-CM | POA: Diagnosis not present

## 2017-07-22 DIAGNOSIS — K2210 Ulcer of esophagus without bleeding: Secondary | ICD-10-CM | POA: Diagnosis not present

## 2017-07-22 DIAGNOSIS — Z980 Intestinal bypass and anastomosis status: Secondary | ICD-10-CM | POA: Diagnosis not present

## 2017-07-22 DIAGNOSIS — R1013 Epigastric pain: Secondary | ICD-10-CM | POA: Diagnosis not present

## 2017-07-31 ENCOUNTER — Other Ambulatory Visit: Payer: Self-pay | Admitting: Internal Medicine

## 2017-08-06 NOTE — Telephone Encounter (Signed)
Error

## 2017-08-07 NOTE — Telephone Encounter (Signed)
Called again. Phone disconnected

## 2017-08-30 ENCOUNTER — Other Ambulatory Visit: Payer: Self-pay | Admitting: Internal Medicine

## 2017-10-09 ENCOUNTER — Encounter: Payer: Self-pay | Admitting: Internal Medicine

## 2017-10-09 DIAGNOSIS — J069 Acute upper respiratory infection, unspecified: Secondary | ICD-10-CM | POA: Diagnosis not present

## 2017-10-11 DIAGNOSIS — J019 Acute sinusitis, unspecified: Secondary | ICD-10-CM | POA: Diagnosis not present

## 2017-10-28 ENCOUNTER — Other Ambulatory Visit: Payer: Self-pay | Admitting: Internal Medicine

## 2017-10-28 NOTE — Telephone Encounter (Signed)
Refilled

## 2017-10-28 NOTE — Telephone Encounter (Signed)
Refilled: 05/01/2017 Last OV: 06/11/2017 Next OV: not scheduled

## 2017-11-27 ENCOUNTER — Other Ambulatory Visit: Payer: Self-pay | Admitting: Internal Medicine

## 2017-11-28 ENCOUNTER — Other Ambulatory Visit: Payer: Self-pay | Admitting: Unknown Physician Specialty

## 2017-11-28 DIAGNOSIS — R1013 Epigastric pain: Secondary | ICD-10-CM

## 2017-11-28 DIAGNOSIS — R1310 Dysphagia, unspecified: Secondary | ICD-10-CM | POA: Diagnosis not present

## 2017-12-02 ENCOUNTER — Ambulatory Visit: Payer: Medicare Other | Admitting: General Surgery

## 2017-12-02 ENCOUNTER — Other Ambulatory Visit
Admission: RE | Admit: 2017-12-02 | Discharge: 2017-12-02 | Disposition: A | Discharge status: Home / Self Care | Payer: Medicare Other | Source: Ambulatory Visit | Attending: Internal Medicine | Admitting: Internal Medicine

## 2017-12-02 ENCOUNTER — Encounter: Payer: Self-pay | Admitting: General Surgery

## 2017-12-02 VITALS — BP 106/62 | HR 104 | Resp 16 | Ht <= 58 in | Wt 113.0 lb

## 2017-12-02 DIAGNOSIS — K5090 Crohn's disease, unspecified, without complications: Secondary | ICD-10-CM | POA: Diagnosis not present

## 2017-12-02 DIAGNOSIS — R1310 Dysphagia, unspecified: Secondary | ICD-10-CM | POA: Insufficient documentation

## 2017-12-02 DIAGNOSIS — D7589 Other specified diseases of blood and blood-forming organs: Secondary | ICD-10-CM

## 2017-12-02 LAB — COMPREHENSIVE METABOLIC PANEL
ALT: 25 U/L (ref 14–54)
AST: 31 U/L (ref 15–41)
Albumin: 4.5 g/dL (ref 3.5–5.0)
Alkaline Phosphatase: 54 U/L (ref 38–126)
Anion gap: 9 (ref 5–15)
BILIRUBIN TOTAL: 0.7 mg/dL (ref 0.3–1.2)
BUN: 9 mg/dL (ref 6–20)
CALCIUM: 9 mg/dL (ref 8.9–10.3)
CHLORIDE: 107 mmol/L (ref 101–111)
CO2: 23 mmol/L (ref 22–32)
CREATININE: 0.89 mg/dL (ref 0.44–1.00)
GFR calc non Af Amer: >60 mL/min (ref >60)
Glucose, Bld: 80 mg/dL (ref 65–99)
Potassium: 4.4 mmol/L (ref 3.5–5.1)
Sodium: 139 mmol/L (ref 135–145)
TOTAL PROTEIN: 6.9 g/dL (ref 6.5–8.1)

## 2017-12-02 LAB — FOLATE: Folate: 21.2 ng/mL (ref >5.9)

## 2017-12-02 LAB — C-REACTIVE PROTEIN: CRP: 1.4 mg/dL — AB (ref <1.0)

## 2017-12-02 LAB — CBC
HEMATOCRIT: 45.5 % (ref 35.0–47.0)
Hemoglobin: 15.8 g/dL (ref 12.0–16.0)
MCH: 36.1 pg — ABNORMAL HIGH (ref 26.0–34.0)
MCHC: 34.7 g/dL (ref 32.0–36.0)
MCV: 104.2 fL — AB (ref 80.0–100.0)
PLATELETS: 270 10*3/uL (ref 150–440)
RBC: 4.36 MIL/uL (ref 3.80–5.20)
RDW: 12.9 % (ref 11.5–14.5)
WBC: 8.7 10*3/uL (ref 3.6–11.0)

## 2017-12-02 LAB — VITAMIN B12: Vitamin B-12: 323 pg/mL (ref 180–914)

## 2017-12-02 LAB — SEDIMENTATION RATE: SED RATE: 2 mm/h (ref 0–30)

## 2017-12-02 NOTE — Progress Notes (Signed)
Patient ID: Tamara [name], female   DOB: April 25, 1946, 72 y.o.   MRN: [id number]  Chief Complaint  Patient presents with  . Other    HPI Tamara [name] is a 72 y.o. female here today for trouble swallowing. Patient states this has been going on for more than 10 years. It has got worse in the last 2 years.  It doesn't matter what she eats.  Liquids goes down fine, but she experiences burning pain when it "when hits her stomach".  Moves her bowels daily. She states her stool are dark black and mucus for the last several months.  She denies any mucus in the stools..  The patient reports that she has had a sensation of food going slowly through the upper esophagus, pointing to the thoracic inlet and then in the epigastric area again a sensation that foods are moving slowly.  The patient is previously undergone a laparoscopic Nissen fundoplication, splenectomy for unclear reasons and a small bowel resection for obstruction.  Details of the latter operation are not clear at this time.  The patient reports no abdominal bloating with oral intake.     HPI  Past Medical History:  Diagnosis Date  . Bowel obstruction ([place])    s/p laparotomy/ LOA 3/08, prior reversal of jejunal bypasss 2004  . Chronic abdominal pain   . COPD (chronic obstructive pulmonary disease) ([place])   . Crohn's disease ([place])   . Depression   . History of DVT (deep vein thrombosis) 01/2007   post operative right leg, s/p vena cava filter  . Osteoporosis     Past Surgical History:  Procedure Laterality Date  . ABDOMINAL HYSTERECTOMY    . APPENDECTOMY    . CATARACT EXTRACTION    . CERVICAL FUSION  2005   C6-7, anterior approach  . CHOLECYSTECTOMY    . COLONOSCOPY  2015  . gyn surgery     hysterectomy  . LAPAROTOMY  01/30/07   for bowel obstruction with LOA  . LUMBAR LAMINECTOMY/DECOMPRESSION MICRODISCECTOMY N/A 12/25/2016   Procedure: LUMBAR DECOMPRESSION L4-5;  Surgeon: Meade Maw, MD;  Location: [facility] ORS;   Service: Neurosurgery;  Laterality: N/A;  . NISSEN FUNDOPLICATION    . ROTATOR CUFF REPAIR     right  . SPLENECTOMY     secondary to colonoscopy prep  . TONSILLECTOMY      Family History  Problem Relation Age of Onset  . Diabetes Mother        type 2  . Heart disease Mother   . Hypertension Mother   . Coronary artery disease Father     Social History Social History   Tobacco Use  . Smoking status: Current Every Day Smoker    Packs/day: 1.00    Types: Cigarettes    Last attempt to quit: 12/13/2014    Years since quitting: 2.9  . Smokeless tobacco: Never Used  . Tobacco comment: half to one pack of cigs per day  Substance Use Topics  . Alcohol use: No    Alcohol/week: 0.0 oz  . Drug use: No    Allergies  Allergen Reactions  . Sulfa Antibiotics Rash    Current Outpatient Medications  Medication Sig Dispense Refill  . clonazePAM (KLONOPIN) 0.5 MG tablet Take 1 tablet (0.5 mg total) by mouth daily as needed for anxiety. 30 tablet 5  . nortriptyline (PAMELOR) 25 MG capsule TAKE 1 CAPSULE BY MOUTH AT BEDTIME 90 capsule 1  . omeprazole (PRILOSEC) 20 MG capsule Take 20 mg by  mouth daily.    . sertraline (ZOLOFT) 100 MG tablet Take 1 tablet (100 mg total) by mouth daily. 30 tablet 5  . traZODone (DESYREL) 100 MG tablet TAKE 1 TABLET BY MOUTH AT BEDTIME 30 tablet 3   No current facility-administered medications for this visit.     Review of Systems Review of Systems  Constitutional: Negative.   HENT: Positive for trouble swallowing.   Respiratory: Positive for cough.   Cardiovascular: Negative.   Gastrointestinal: Positive for abdominal pain, blood in stool, constipation, diarrhea and nausea.    Blood pressure 106/62, height _0  (1.448 m), weight 113 lb (51.3 kg).  Physical Exam Physical Exam  Constitutional: She is oriented to person, place, and time. She appears well-developed and well-nourished.  Eyes: Conjunctivae are normal. No scleral icterus.  Neck: Neck  supple.  Cardiovascular: Normal rate, regular rhythm and normal heart sounds.  Pulmonary/Chest: Effort normal and breath sounds normal.  Abdominal: Soft. Bowel sounds are normal. There is tenderness.  Lymphadenopathy:    She has no cervical adenopathy.  Neurological: She is alert and oriented to person, place, and time.  Skin: Skin is warm and dry.    Data Reviewed Personal discussion with Gaylyn Cheers, MD.  Review of the July 22, 2017 upper endoscopy showed 3 superficial ulcers at the GE junction.  Intact Nissen wrap.  Suggestion that the wrap was "tight" but was able to be traversed.  Laboratory studies from June 11, 2017 showed a hemoglobin of 14.7 with an MCV of 105.8, white blood cell count 9900 with 45% polys, 44% lymphocytes, platelet count 314,000. Serum iron levels were elevated at 151.  Transferrin level 323, saturation 33%.  Comprehensive metabolic panel was normal with electrolytes showing a potassium of 4.9, creatinine 0.77 with an estimated GFR of 79, normal liver function studies.  Assessment    Dysphasia with suggestion of both motility and possible stenosis at the site of her previous Nissen fundoplication.  Black stools, dietary versus ongoing bleeding.    Plan    I spoke with Dr. Vira Agar by phone to coordinate the patient's care.  He had previously arrange for a barium swallow at my request.  This will be expanded to include a small bowel follow-through to assess for her Crohn's disease.  Will check B12 and folate levels based on her macrocytosis noted last fall.  Will obtain a CBC to determine if the black stools related to ongoing blood loss.    I had a long discussion with the patient that should she require revision of her Nissen fundoplication this would be best carried out by Arletta Bale, MD.  She is reluctant to travel, and I am incredibly reluctant to have the patient undergo a redo operation in less than expert hand.  She might respond to  gentle esophageal dilatation with a balloon dilator, but will await the results of her upcoming barium studies. HPI, Physical Exam, Assessment and Plan have been scribed under the direction and in the presence of Hervey Ard, MD.  Gaspar Cola, CMA  I have completed the exam and reviewed the above documentation for accuracy and completeness.  I agree with the above.  Haematologist has been used and any errors in dictation or transcription are unintentional.  Hervey Ard, M.D., F.A.C.S.  The patient will have the following labs done at [facility] today, CBC, CMP, B12, Folate, ESR, and Sed Rate. The patient is scheduled for a small bowel follow thru after her scheduled Barium swallow at [facility] on  12/04/17.  Documented by Lesly Rubenstein LPN  Gaspar Cola 12/02/2017, 9:21 AM

## 2017-12-02 NOTE — Patient Instructions (Addendum)
The patient will have the following labs done at [facility] today, CBC. CMP, B12, Folate, ESR, and Sed Rate. The patient is scheduled for a small bowel follow thru after her scheduled Barium swallow at [facility] on 12/04/17.

## 2017-12-04 ENCOUNTER — Ambulatory Visit
Admission: RE | Admit: 2017-12-04 | Discharge: 2017-12-04 | Disposition: A | Discharge status: Home / Self Care | Payer: Medicare Other | Source: Ambulatory Visit | Attending: Unknown Physician Specialty | Admitting: Unknown Physician Specialty

## 2017-12-04 ENCOUNTER — Ambulatory Visit
Admission: RE | Admit: 2017-12-04 | Discharge: 2017-12-04 | Disposition: A | Discharge status: Home / Self Care | Payer: Medicare Other | Source: Ambulatory Visit | Attending: General Surgery | Admitting: General Surgery

## 2017-12-04 DIAGNOSIS — K222 Esophageal obstruction: Secondary | ICD-10-CM | POA: Insufficient documentation

## 2017-12-04 DIAGNOSIS — R1310 Dysphagia, unspecified: Secondary | ICD-10-CM

## 2017-12-04 DIAGNOSIS — K5090 Crohn's disease, unspecified, without complications: Secondary | ICD-10-CM | POA: Diagnosis present

## 2017-12-04 DIAGNOSIS — K598 Other specified functional intestinal disorders: Secondary | ICD-10-CM | POA: Insufficient documentation

## 2017-12-04 DIAGNOSIS — K228 Other specified diseases of esophagus: Secondary | ICD-10-CM | POA: Insufficient documentation

## 2017-12-04 DIAGNOSIS — N139 Obstructive and reflux uropathy, unspecified: Secondary | ICD-10-CM | POA: Diagnosis not present

## 2017-12-04 DIAGNOSIS — R1013 Epigastric pain: Secondary | ICD-10-CM

## 2017-12-04 DIAGNOSIS — D7589 Other specified diseases of blood and blood-forming organs: Secondary | ICD-10-CM | POA: Insufficient documentation

## 2017-12-04 HISTORY — DX: Dysphagia, unspecified: R13.10

## 2017-12-08 ENCOUNTER — Encounter: Payer: Self-pay | Admitting: General Surgery

## 2017-12-08 NOTE — Progress Notes (Addendum)
Barium swallow and small bowel follow-through films reviewed.  Barium tablet held at the GE junction, correlating with the area of stenosis identified at the time of Dr. Percell Boston upper endoscopy.  Small bowel transit time 20 minutes.  Mild dilation of one section of the colon without evidence of significant transit problems.  Discussed case with Dr. Vira Agar by phone.  Patient may benefit from a trial of dilatation with the balloon dilator system rather than a bougie.  By patient was contacted with recommendations for trial dilatation, recognizing the potential risk for damage or rupture of the esophagus.  This would be in place of immediate referral for redo of her Nissen wrap.  Review of the images from Dr. Percell Boston endoscopy showed a moderate amount of inflammation, and if this is quieted with even a modest increase in the diameter of the GE junction she may resolve her symptoms.  Laboratory studies obtained on January 22 showed a modest elevation of the CRP at 1.4, normal ESR at 2, MCV 104 with a low normal B12 level at 323, up from 289, 8 months ago.  Conference of metabolic panel showed normal liver function studies with an albumin of 4.5, normal electrolytes.  Creatinine 0.89.  Normal estimated GFR greater than 60.  Her hemoglobin was 15.8.  The patient had reported black stools.  No evidence of bleeding based on her laboratory studies.

## 2017-12-09 ENCOUNTER — Telehealth: Payer: Self-pay

## 2017-12-09 ENCOUNTER — Encounter: Payer: Self-pay | Admitting: Unknown Physician Specialty

## 2017-12-09 ENCOUNTER — Other Ambulatory Visit: Payer: Self-pay | Admitting: General Surgery

## 2017-12-09 DIAGNOSIS — J0110 Acute frontal sinusitis, unspecified: Secondary | ICD-10-CM | POA: Diagnosis not present

## 2017-12-09 DIAGNOSIS — R05 Cough: Secondary | ICD-10-CM | POA: Diagnosis not present

## 2017-12-09 DIAGNOSIS — R4702 Dysphasia: Secondary | ICD-10-CM

## 2017-12-09 DIAGNOSIS — R0982 Postnasal drip: Secondary | ICD-10-CM | POA: Diagnosis not present

## 2017-12-09 NOTE — Telephone Encounter (Signed)
The patient is scheduled for an Upper Endoscopy with Dilatation at [facility] on 12/17/17. She is aware to call the day before for arrival time. She is aware of date and instructions.

## 2017-12-15 DIAGNOSIS — J019 Acute sinusitis, unspecified: Secondary | ICD-10-CM | POA: Diagnosis not present

## 2017-12-17 ENCOUNTER — Ambulatory Visit
Admission: RE | Admit: 2017-12-17 | Discharge: 2017-12-17 | Disposition: A | Discharge status: Home / Self Care | Payer: Medicare Other | Source: Ambulatory Visit | Attending: General Surgery | Admitting: General Surgery

## 2017-12-17 ENCOUNTER — Encounter
Admission: RE | Disposition: A | Discharge status: Home / Self Care | Payer: Self-pay | Source: Ambulatory Visit | Attending: General Surgery

## 2017-12-17 ENCOUNTER — Ambulatory Visit: Payer: Medicare Other | Admitting: Anesthesiology

## 2017-12-17 ENCOUNTER — Encounter: Payer: Self-pay | Admitting: Anesthesiology

## 2017-12-17 DIAGNOSIS — K2950 Unspecified chronic gastritis without bleeding: Secondary | ICD-10-CM | POA: Diagnosis not present

## 2017-12-17 DIAGNOSIS — K3189 Other diseases of stomach and duodenum: Secondary | ICD-10-CM | POA: Diagnosis not present

## 2017-12-17 DIAGNOSIS — K208 Other esophagitis: Secondary | ICD-10-CM | POA: Insufficient documentation

## 2017-12-17 DIAGNOSIS — K222 Esophageal obstruction: Secondary | ICD-10-CM | POA: Diagnosis not present

## 2017-12-17 DIAGNOSIS — R1310 Dysphagia, unspecified: Secondary | ICD-10-CM | POA: Diagnosis not present

## 2017-12-17 DIAGNOSIS — J449 Chronic obstructive pulmonary disease, unspecified: Secondary | ICD-10-CM | POA: Insufficient documentation

## 2017-12-17 DIAGNOSIS — Z86718 Personal history of other venous thrombosis and embolism: Secondary | ICD-10-CM | POA: Diagnosis not present

## 2017-12-17 DIAGNOSIS — F17210 Nicotine dependence, cigarettes, uncomplicated: Secondary | ICD-10-CM | POA: Diagnosis not present

## 2017-12-17 DIAGNOSIS — B3781 Candidal esophagitis: Secondary | ICD-10-CM | POA: Diagnosis not present

## 2017-12-17 DIAGNOSIS — Z882 Allergy status to sulfonamides status: Secondary | ICD-10-CM | POA: Insufficient documentation

## 2017-12-17 DIAGNOSIS — Z79899 Other long term (current) drug therapy: Secondary | ICD-10-CM | POA: Insufficient documentation

## 2017-12-17 DIAGNOSIS — Z981 Arthrodesis status: Secondary | ICD-10-CM | POA: Insufficient documentation

## 2017-12-17 DIAGNOSIS — K2960 Other gastritis without bleeding: Secondary | ICD-10-CM | POA: Diagnosis not present

## 2017-12-17 DIAGNOSIS — F329 Major depressive disorder, single episode, unspecified: Secondary | ICD-10-CM | POA: Diagnosis not present

## 2017-12-17 DIAGNOSIS — M1990 Unspecified osteoarthritis, unspecified site: Secondary | ICD-10-CM | POA: Diagnosis not present

## 2017-12-17 DIAGNOSIS — K5090 Crohn's disease, unspecified, without complications: Secondary | ICD-10-CM | POA: Insufficient documentation

## 2017-12-17 DIAGNOSIS — R4702 Dysphasia: Secondary | ICD-10-CM

## 2017-12-17 HISTORY — PX: ESOPHAGOGASTRODUODENOSCOPY (EGD) WITH PROPOFOL: SHX5813

## 2017-12-17 SURGERY — ESOPHAGOGASTRODUODENOSCOPY (EGD) WITH PROPOFOL
Anesthesia: General

## 2017-12-17 MED ORDER — PROPOFOL 500 MG/50ML IV EMUL
INTRAVENOUS | Status: AC
  Filled 2017-12-17: qty 50

## 2017-12-17 MED ORDER — SODIUM CHLORIDE 0.9 % IV SOLN
INTRAVENOUS | Status: DC
  Administered 2017-12-17: 10:00:00 via INTRAVENOUS
  Administered 2017-12-17: 1000 mL via INTRAVENOUS

## 2017-12-17 MED ORDER — LIDOCAINE HCL (PF) 1 % IJ SOLN
2.0000 mL | Freq: Once | INTRAMUSCULAR | Status: AC
  Administered 2017-12-17: 0.3 mL via INTRADERMAL

## 2017-12-17 MED ORDER — LIDOCAINE HCL (PF) 2 % IJ SOLN
INTRAMUSCULAR | Status: DC | PRN
  Administered 2017-12-17: 100 mg via INTRADERMAL

## 2017-12-17 MED ORDER — PROPOFOL 500 MG/50ML IV EMUL
INTRAVENOUS | Status: DC | PRN
  Administered 2017-12-17: 150 ug/kg/min via INTRAVENOUS

## 2017-12-17 MED ORDER — IPRATROPIUM-ALBUTEROL 0.5-2.5 (3) MG/3ML IN SOLN
RESPIRATORY_TRACT | Status: AC
  Administered 2017-12-17: 3 mL via RESPIRATORY_TRACT
  Filled 2017-12-17: qty 3

## 2017-12-17 MED ORDER — IPRATROPIUM-ALBUTEROL 0.5-2.5 (3) MG/3ML IN SOLN
3.0000 mL | Freq: Once | RESPIRATORY_TRACT | Status: AC
  Administered 2017-12-17: 3 mL via RESPIRATORY_TRACT

## 2017-12-17 MED ORDER — LIDOCAINE HCL (PF) 1 % IJ SOLN
INTRAMUSCULAR | Status: AC
  Administered 2017-12-17: 0.3 mL via INTRADERMAL
  Filled 2017-12-17: qty 2

## 2017-12-17 MED ORDER — PROPOFOL 10 MG/ML IV BOLUS
INTRAVENOUS | Status: DC | PRN
  Administered 2017-12-17: 60 mg via INTRAVENOUS

## 2017-12-17 NOTE — Op Note (Signed)
[facility] Gastroenterology Patient Name: Tamara [name] Procedure Date: 12/17/2017 9:28 AM MRN: [id number] Account #: [id number] Date of Birth: [birth year] Admit Type: Outpatient Age: 72 Room: [facility] ENDO ROOM 1 Gender: Female Note Status: Finalized Procedure:            Upper GI endoscopy Providers:            Robert Bellow, MD Referring MD:         Deborra Medina, MD (Referring MD) Medicines:            Monitored Anesthesia Care Complications:        No immediate complications. Procedure:            Pre-Anesthesia Assessment:                       - Prior to the procedure, a History and Physical was                        performed, and patient medications, allergies and                        sensitivities were reviewed. The patient's tolerance of                        previous anesthesia was reviewed.                       - The risks and benefits of the procedure and the                        sedation options and risks were discussed with the                        patient. All questions were answered and informed                        consent was obtained.                       After obtaining informed consent, the endoscope was                        passed under direct vision. Throughout the procedure,                        the patient's blood pressure, pulse, and oxygen                        saturations were monitored continuously. The Endoscope                        was introduced through the mouth, and advanced to the                        second part of duodenum. The upper GI endoscopy was                        accomplished without difficulty. The patient tolerated                        the procedure well. Findings:  One moderate benign-appearing, intrinsic stenosis was found 39 cm from       the incisors. This measured 1 cm (inner diameter) x 1 cm (in length) and       was traversed. A TTS dilator was passed through the scope. Dilation  with       a 12-13.5-15 mm balloon dilator was performed to 15 mm. The dilation       site was examined and showed moderate improvement in luminal narrowing.      LA Grade A (one or more mucosal breaks less than 5 mm, not extending       between tops of 2 mucosal folds) esophagitis with no bleeding was found       36 to 39 cm from the incisors. Biopsies were taken with a cold forceps       for histology.      Localized moderate inflammation characterized by granularity was found       in the prepyloric region of the stomach. Biopsies were taken with a cold       forceps for histology.      The examined duodenum was normal. Impression:           - Benign-appearing esophageal stenosis. Dilated.                       - LA Grade A candidiasis esophagitis. Biopsied.                       - Chronic gastritis. Biopsied.                       - Normal examined duodenum. Recommendation:       - Telephone endoscopist for pathology results in 1 week.                       - Return to endoscopist in 3 weeks. Procedure Code(s):    --- Professional ---                       [phone], Esophagogastroduodenoscopy, flexible, transoral;                        with transendoscopic balloon dilation of esophagus                        (less than 30 mm diameter)                       43239, Esophagogastroduodenoscopy, flexible, transoral;                        with biopsy, single or multiple Diagnosis Code(s):    --- Professional ---                       K29.50, Unspecified chronic gastritis without bleeding                       B37.81, Candidal esophagitis                       K22.2, Esophageal obstruction CPT copyright 2016 American Medical Association. All rights reserved. The codes documented in this report are preliminary and upon coder review may  be revised to meet current  compliance requirements. Robert Bellow, MD 12/17/2017 10:05:32 AM This report has been signed electronically. Number of  Addenda: 0 Note Initiated On: 12/17/2017 9:28 AM      [facility]

## 2017-12-17 NOTE — Anesthesia Preprocedure Evaluation (Signed)
Anesthesia Evaluation  Patient identified by MRN, date of birth, ID band Patient awake    Reviewed: Allergy & Precautions, H&P , NPO status , Patient's Chart, lab work & pertinent test results  History of Anesthesia Complications Negative for: history of anesthetic complications  Airway Mallampati: III  TM Distance: <3 FB Neck ROM: full    Dental  (+) Chipped, Poor Dentition   Pulmonary neg shortness of breath, COPD, Current Smoker,           Cardiovascular Exercise Tolerance: Good (-) angina(-) Past MI and (-) DOE negative cardio ROS       Neuro/Psych PSYCHIATRIC DISORDERS Depression  Neuromuscular disease negative psych ROS   GI/Hepatic negative GI ROS, Neg liver ROS, neg GERD  ,  Endo/Other  negative endocrine ROS  Renal/GU negative Renal ROS  negative genitourinary   Musculoskeletal  (+) Arthritis ,   Abdominal   Peds  Hematology negative hematology ROS (+)   Anesthesia Other Findings Past Medical History: No date: Bowel obstruction ([place])     Comment:  s/p laparotomy/ LOA 3/08, prior reversal of jejunal               bypasss 2004 No date: Chronic abdominal pain No date: COPD (chronic obstructive pulmonary disease) ([facility]) No date: Crohn's disease ([place]) No date: Depression 01/2007: History of DVT (deep vein thrombosis)     Comment:  post operative right leg, s/p vena cava filter No date: Osteoporosis  Past Surgical History: No date: ABDOMINAL HYSTERECTOMY No date: APPENDECTOMY No date: CATARACT EXTRACTION 2005: CERVICAL FUSION     Comment:  C6-7, anterior approach No date: CHOLECYSTECTOMY 2015: COLONOSCOPY No date: gyn surgery     Comment:  hysterectomy 01/30/07: LAPAROTOMY     Comment:  for bowel obstruction with LOA 12/25/2016: LUMBAR LAMINECTOMY/DECOMPRESSION MICRODISCECTOMY; N/A     Comment:  Procedure: LUMBAR DECOMPRESSION L4-5;  Surgeon: Meade Maw, MD;  Location: [facility] ORS;   Service:               Neurosurgery;  Laterality: N/A; No date: NISSEN FUNDOPLICATION No date: ROTATOR CUFF REPAIR     Comment:  right No date: SPLENECTOMY     Comment:  secondary to colonoscopy prep No date: TONSILLECTOMY  BMI    Body Mass Index:  21.48 kg/m      Reproductive/Obstetrics negative OB ROS                             Anesthesia Physical Anesthesia Plan  ASA: III  Anesthesia Plan: General   Post-op Pain Management:    Induction: Intravenous  PONV Risk Score and Plan: Propofol infusion  Airway Management Planned: Natural Airway and Nasal Cannula  Additional Equipment:   Intra-op Plan:   Post-operative Plan:   Informed Consent: I have reviewed the patients History and Physical, chart, labs and discussed the procedure including the risks, benefits and alternatives for the proposed anesthesia with the patient or authorized representative who has indicated his/her understanding and acceptance.   Dental Advisory Given  Plan Discussed with: Anesthesiologist, CRNA and Surgeon  Anesthesia Plan Comments: (Patient consented for risks of anesthesia including but not limited to:  - adverse reactions to medications - risk of intubation if required - damage to teeth, lips or other oral mucosa - sore throat or hoarseness - Damage to heart, brain, lungs or loss of life  Patient voiced understanding.)        Anesthesia Quick Evaluation

## 2017-12-17 NOTE — H&P (Signed)
Tamara [name] [id number] 1946-04-09     HPI:  Dysphagia with evidence of distal esophagitis. S/P Nissen fundoplication. Ba Swallow showed tablet hung at GEJ. For EGD and possible dilatation. Risks related to dilatation discussed.   Medications Prior to Admission  Medication Sig Dispense Refill Last Dose  . clonazePAM (KLONOPIN) 0.5 MG tablet Take 1 tablet (0.5 mg total) by mouth daily as needed for anxiety. 30 tablet 5 Taking  . nortriptyline (PAMELOR) 25 MG capsule TAKE 1 CAPSULE BY MOUTH AT BEDTIME 90 capsule 1 Taking  . omeprazole (PRILOSEC) 20 MG capsule Take 20 mg by mouth daily.   Taking  . sertraline (ZOLOFT) 100 MG tablet Take 1 tablet (100 mg total) by mouth daily. 30 tablet 5 Taking  . traZODone (DESYREL) 100 MG tablet TAKE 1 TABLET BY MOUTH AT BEDTIME 30 tablet 3 Taking   Allergies  Allergen Reactions  . Sulfa Antibiotics Rash   Past Medical History:  Diagnosis Date  . Bowel obstruction ([place])    s/p laparotomy/ LOA 3/08, prior reversal of jejunal bypasss 2004  . Chronic abdominal pain   . COPD (chronic obstructive pulmonary disease) ([place])   . Crohn's disease ([place])   . Depression   . History of DVT (deep vein thrombosis) 01/2007   post operative right leg, s/p vena cava filter  . Osteoporosis    Past Surgical History:  Procedure Laterality Date  . ABDOMINAL HYSTERECTOMY    . APPENDECTOMY    . CATARACT EXTRACTION    . CERVICAL FUSION  2005   C6-7, anterior approach  . CHOLECYSTECTOMY    . COLONOSCOPY  2015  . gyn surgery     hysterectomy  . LAPAROTOMY  01/30/07   for bowel obstruction with LOA  . LUMBAR LAMINECTOMY/DECOMPRESSION MICRODISCECTOMY N/A 12/25/2016   Procedure: LUMBAR DECOMPRESSION L4-5;  Surgeon: Meade Maw, MD;  Location: [facility] ORS;  Service: Neurosurgery;  Laterality: N/A;  . NISSEN FUNDOPLICATION    . ROTATOR CUFF REPAIR     right  . SPLENECTOMY     secondary to colonoscopy prep  . TONSILLECTOMY     Social History   Socioeconomic  History  . Marital status: Married    Spouse name: Not on file  . Number of children: Not on file  . Years of education: Not on file  . Highest education level: Not on file  Social Needs  . Financial resource strain: Not on file  . Food insecurity - worry: Not on file  . Food insecurity - inability: Not on file  . Transportation needs - medical: Not on file  . Transportation needs - non-medical: Not on file  Occupational History  . Not on file  Tobacco Use  . Smoking status: Current Every Day Smoker    Packs/day: 1.00    Types: Cigarettes    Last attempt to quit: 12/13/2014    Years since quitting: 3.0  . Smokeless tobacco: Never Used  . Tobacco comment: half to one pack of cigs per day  Substance and Sexual Activity  . Alcohol use: No    Alcohol/week: 0.0 oz  . Drug use: No  . Sexual activity: Not on file  Other Topics Concern  . Not on file  Social History Narrative   Lives with spouse.   Social History   Social History Narrative   Lives with spouse.     ROS: Negative.     PE: HEENT: Negative. Lungs: Clear. Cardio: RR.  Assessment/Plan:  Proceed with planned upper endoscopy and  dilatation.   Forest Gleason [facility] 12/17/2017

## 2017-12-17 NOTE — Anesthesia Procedure Notes (Signed)
Date/Time: 12/17/2017 9:45 AM Performed by: Nelda Marseille, CRNA Pre-anesthesia Checklist: Patient identified, Emergency Drugs available, Suction available, Patient being monitored and Timeout performed Oxygen Delivery Method: Nasal cannula

## 2017-12-17 NOTE — Anesthesia Post-op Follow-up Note (Signed)
Anesthesia QCDR form completed.        

## 2017-12-17 NOTE — Transfer of Care (Signed)
Immediate Anesthesia Transfer of Care Note  Patient: Tamara [name]  Procedure(s) Performed: ESOPHAGOGASTRODUODENOSCOPY (EGD) WITH PROPOFOL (N/A )  Patient Location: PACU  Anesthesia Type:General  Level of Consciousness: awake, alert  and oriented  Airway & Oxygen Therapy: Patient Spontanous Breathing and Patient connected to nasal cannula oxygen  Post-op Assessment: Report given to RN and Post -op Vital signs reviewed and stable  Post vital signs: Reviewed and stable  Last Vitals:  Vitals:   12/17/17 0843 12/17/17 1004  BP: 120/69 101/70  Pulse: (!) 108   Resp: 17   Temp: 36.6 C (!) 36.1 C  SpO2: 100%     Last Pain:  Vitals:   12/17/17 1004  TempSrc: Tympanic         Complications: No apparent anesthesia complications

## 2017-12-17 NOTE — Anesthesia Postprocedure Evaluation (Signed)
Anesthesia Post Note  Patient: Tamara [name]  Procedure(s) Performed: ESOPHAGOGASTRODUODENOSCOPY (EGD) WITH PROPOFOL (N/A )  Patient location during evaluation: Endoscopy Anesthesia Type: General Level of consciousness: awake and alert Pain management: pain level controlled Vital Signs Assessment: post-procedure vital signs reviewed and stable Respiratory status: spontaneous breathing, nonlabored ventilation, respiratory function stable and patient connected to nasal cannula oxygen Cardiovascular status: blood pressure returned to baseline and stable Postop Assessment: no apparent nausea or vomiting Anesthetic complications: no     Last Vitals:  Vitals:   12/17/17 1024 12/17/17 1029  BP: 117/71   Pulse:  70  Resp:    Temp:    SpO2:      Last Pain:  Vitals:   12/17/17 1004  TempSrc: Tympanic                 Precious Haws Piscitello

## 2017-12-18 ENCOUNTER — Encounter: Payer: Self-pay | Admitting: General Surgery

## 2017-12-22 ENCOUNTER — Encounter: Payer: Self-pay | Admitting: Internal Medicine

## 2017-12-22 LAB — SURGICAL PATHOLOGY

## 2017-12-23 ENCOUNTER — Telehealth: Payer: Self-pay | Admitting: *Deleted

## 2017-12-23 MED ORDER — NYSTATIN 100000 UNIT/ML MT SUSP: 5.0000 mL | Freq: Four times a day (QID) | OROMUCOSAL | 0 refills | Status: DC

## 2017-12-23 NOTE — Telephone Encounter (Signed)
No cancer   No obvious infection .   Follow up next week

## 2017-12-23 NOTE — Telephone Encounter (Signed)
Phone call from [place], nystatin is on national back order, substitution is clotrimazole lozenges QID #[id number]

## 2017-12-23 NOTE — Addendum Note (Signed)
Addended by: Carson Myrtle on: 12/23/2017 03:36 PM   Modules accepted: Orders

## 2017-12-23 NOTE — Telephone Encounter (Signed)
She called asking for most resent biopsy results. She states that she is still having the same trouble swallowing, no change.

## 2017-12-24 ENCOUNTER — Telehealth: Payer: Self-pay | Admitting: Internal Medicine

## 2017-12-24 MED ORDER — CLONAZEPAM 0.5 MG PO TABS: 0.5000 mg | ORAL_TABLET | Freq: Every day | ORAL | 0 refills | Status: DC | PRN

## 2017-12-24 NOTE — Telephone Encounter (Signed)
Refilled: 06/11/2017 Last OV: 06/11/2017 Next OV: not scheduled

## 2017-12-24 NOTE — Telephone Encounter (Signed)
MyChart message sent Refill for 30 days only.  OFFICE VISIT NEEDED prior to any more refills

## 2017-12-24 NOTE — Telephone Encounter (Signed)
Notified patient as instructed, patient pleased. Discussed follow-up appointments, 01-01-18 3:15, patient agrees

## 2017-12-25 NOTE — Telephone Encounter (Signed)
Printed, signed and faxed.  

## 2018-01-01 ENCOUNTER — Ambulatory Visit (INDEPENDENT_AMBULATORY_CARE_PROVIDER_SITE_OTHER): Payer: Medicare Other | Admitting: General Surgery

## 2018-01-01 ENCOUNTER — Encounter: Payer: Self-pay | Admitting: General Surgery

## 2018-01-01 VITALS — BP 116/68 | HR 110 | Resp 12 | Ht 59.0 in | Wt 107.0 lb

## 2018-01-01 DIAGNOSIS — K222 Esophageal obstruction: Secondary | ICD-10-CM | POA: Diagnosis not present

## 2018-01-01 DIAGNOSIS — R1310 Dysphagia, unspecified: Secondary | ICD-10-CM

## 2018-01-01 DIAGNOSIS — R1319 Other dysphagia: Secondary | ICD-10-CM

## 2018-01-01 NOTE — Patient Instructions (Addendum)
  Patient needs to crush her pills and get least 1500 calories in daily.

## 2018-01-01 NOTE — Progress Notes (Signed)
Patient ID: Tamara [name], female   DOB: 13-Jan-1946, 72 y.o.   MRN: [id number]  Chief Complaint  Patient presents with  . Follow-up    HPI Tamara [name] is a 72 y.o. female here today for her follow up dysphagia. Patient states the pills still get caught up in her throat. If she gets food down she states she feels bloated. She has lost 6 ponds in the last month. She is drinking boost and eating soft foods.  HPI  Past Medical History:  Diagnosis Date  . Bowel obstruction ([place])    s/p laparotomy/ LOA 3/08, prior reversal of jejunal bypasss 2004  . Chronic abdominal pain   . COPD (chronic obstructive pulmonary disease) ([place])   . Crohn's disease ([place])   . Depression   . History of DVT (deep vein thrombosis) 01/2007   post operative right leg, s/p vena cava filter  . Osteoporosis     Past Surgical History:  Procedure Laterality Date  . ABDOMINAL HYSTERECTOMY    . APPENDECTOMY    . CATARACT EXTRACTION    . CERVICAL FUSION  2005   C6-7, anterior approach  . CHOLECYSTECTOMY    . COLONOSCOPY  2015  . ESOPHAGOGASTRODUODENOSCOPY (EGD) WITH PROPOFOL N/A 12/17/2017   Procedure: ESOPHAGOGASTRODUODENOSCOPY (EGD) WITH PROPOFOL;  Surgeon: Robert Bellow, MD;  Location: [facility] ENDOSCOPY;  Service: Endoscopy;  Laterality: N/A;  . gyn surgery     hysterectomy  . LAPAROTOMY  01/30/07   for bowel obstruction with LOA  . LUMBAR LAMINECTOMY/DECOMPRESSION MICRODISCECTOMY N/A 12/25/2016   Procedure: LUMBAR DECOMPRESSION L4-5;  Surgeon: Meade Maw, MD;  Location: [facility] ORS;  Service: Neurosurgery;  Laterality: N/A;  . NISSEN FUNDOPLICATION    . ROTATOR CUFF REPAIR     right  . SPLENECTOMY     secondary to colonoscopy prep  . TONSILLECTOMY      Family History  Problem Relation Age of Onset  . Diabetes Mother        type 2  . Heart disease Mother   . Hypertension Mother   . Coronary artery disease Father     Social History Social History   Tobacco Use  . Smoking status:  Current Every Day Smoker    Packs/day: 1.00    Types: Cigarettes    Last attempt to quit: 12/13/2014    Years since quitting: 3.0  . Smokeless tobacco: Never Used  . Tobacco comment: half to one pack of cigs per day  Substance Use Topics  . Alcohol use: No    Alcohol/week: 0.0 oz  . Drug use: No    Allergies  Allergen Reactions  . Sulfa Antibiotics Rash    Current Outpatient Medications  Medication Sig Dispense Refill  . clonazePAM (KLONOPIN) 0.5 MG tablet Take 1 tablet (0.5 mg total) by mouth daily as needed for anxiety. 30 tablet 0  . nortriptyline (PAMELOR) 25 MG capsule TAKE 1 CAPSULE BY MOUTH AT BEDTIME 90 capsule 1  . omeprazole (PRILOSEC) 20 MG capsule Take 20 mg by mouth daily.    . sertraline (ZOLOFT) 100 MG tablet Take 1 tablet (100 mg total) by mouth daily. 30 tablet 5  . traZODone (DESYREL) 100 MG tablet TAKE 1 TABLET BY MOUTH AT BEDTIME 30 tablet 3   No current facility-administered medications for this visit.     Review of Systems Review of Systems  Constitutional: Negative.   Respiratory: Negative.   Cardiovascular: Negative.     Blood pressure 116/68, pulse (!) 110, resp. rate 12,  height 4' 11"  (1.499 m), weight 107 lb (48.5 kg).  Physical Exam Physical Exam  Constitutional: She is oriented to person, place, and time. She appears well-developed and well-nourished.  Cardiovascular: Normal rate, regular rhythm and normal heart sounds.  Pulmonary/Chest: Effort normal.  Neurological: She is alert and oriented to person, place, and time.  Skin: Skin is warm and dry.    Data Reviewed Upper endoscopy and dilatation of December 17, 2017 showed improvement in diameter compared to the described procedure of July 22, 2017.  Dilated with the pneumatic TTS system to 15 mm.  DIAGNOSIS:  A. STOMACH, PREPYLORIC MUCOSA; COLD BIOPSY:  - MILD REACTIVE GASTROPATHY.  - NEGATIVE FOR ACTIVE INFLAMMATION, INTESTINAL METAPLASIA, DYSPLASIA,  AND MALIGNANCY.  - NEGATIVE  FOR H. PYLORI IN HEMATOXYLIN AND EOSIN SECTIONS.   B. ESOPHAGUS, DISTAL, AT 37 CM; COLD BIOPSY:  - SQUAMOUS MUCOSA WITH FOCAL PARAKERATOSIS AND AREAS OF GLYCOGENIC  ACANTHOSIS, CONSISTENT WITH REFLUX ESOPHAGITIS.  - NEGATIVE FOR INTRAEPITHELIAL NEUTROPHILS AND EOSINOPHILS.  - NEGATIVE FOR FUNGAL ELEMENTS.  - TINY DETACHED STRIP OF COLUMNAR EPITHELIUM WITHOUT GOBLET CELLS.  - NEGATIVE FOR DYSPLASIA AND MALIGNANCY.    Assessment    Persistent dysphagia for solids.    Plan  Clinical evidence for reflex based on an intact Nissen fundoplication and the focal irritation just above the wrap at the GE junction.  I think this is more traumatic process from food boluses being lodged in the area, and frequent episodes with the patient will have pills stick resulting in retching.  She is lost weight, primarily related to lower dietary intake.  I have asked her to avoid any solid foods and to crush all pills (except her omeprazole) in an attempt to minimize trauma to the distal esophagus.  The importance of ingesting at least 1500 cal/day was emphasized to counteract her weight loss.  While she feels her weight is about her baseline, the loss of 6 pounds over the last month is of concern, especially if she were to require reoperative therapy.  Her postprandial bloating is difficult to explain as it appears to occur both with ice cream and boost, one containing milk sugars, the other than without.    The small bowel series completed December 04, 2017 showed variable caliber of the jejunum and a gas-filled loop of ileum, but passage of the barium to the colon in 20 minutes.  Certainly not a pattern consistent with obstruction.  Prior to referral for revision of her Nissen fundoplication were both willing to make another trial of dilatation with the dietary modification noted above.  As yeast was not identified on biopsy of the esophagus she will discontinue her nystatin therapy.   The patient is  aware to call back for any questions or concerns.   HPI, Physical Exam, Assessment and Plan have been scribed under the direction and in the presence of Hervey Ard, MD.  Gaspar Cola, CMA   I have completed the exam and reviewed the above documentation for accuracy and completeness.  I agree with the above.  Haematologist has been used and any errors in dictation or transcription are unintentional.  Hervey Ard, M.D., F.A.C.S.   Tamara [name] 01/01/2018, 9:22 PM

## 2018-01-05 ENCOUNTER — Telehealth: Payer: Self-pay | Admitting: *Deleted

## 2018-01-05 ENCOUNTER — Other Ambulatory Visit: Payer: Self-pay | Admitting: General Surgery

## 2018-01-05 DIAGNOSIS — R1310 Dysphagia, unspecified: Secondary | ICD-10-CM

## 2018-01-05 DIAGNOSIS — R1319 Other dysphagia: Secondary | ICD-10-CM

## 2018-01-05 NOTE — Telephone Encounter (Signed)
Patient has been scheduled for an upper endoscopy on 01-08-18 at [facility]. Upper endoscopy instructions have been reviewed with the patient by phone. This patient is aware to call the office if they have further questions.

## 2018-01-08 ENCOUNTER — Encounter
Admission: RE | Disposition: A | Discharge status: Home / Self Care | Payer: Self-pay | Source: Ambulatory Visit | Attending: General Surgery

## 2018-01-08 ENCOUNTER — Ambulatory Visit: Payer: Medicare Other | Admitting: Certified Registered Nurse Anesthetist

## 2018-01-08 ENCOUNTER — Ambulatory Visit
Admission: RE | Admit: 2018-01-08 | Discharge: 2018-01-08 | Disposition: A | Discharge status: Home / Self Care | Payer: Medicare Other | Source: Ambulatory Visit | Attending: General Surgery | Admitting: General Surgery

## 2018-01-08 ENCOUNTER — Encounter: Payer: Self-pay | Admitting: *Deleted

## 2018-01-08 DIAGNOSIS — K222 Esophageal obstruction: Secondary | ICD-10-CM | POA: Insufficient documentation

## 2018-01-08 DIAGNOSIS — K5090 Crohn's disease, unspecified, without complications: Secondary | ICD-10-CM | POA: Diagnosis not present

## 2018-01-08 DIAGNOSIS — R1310 Dysphagia, unspecified: Secondary | ICD-10-CM

## 2018-01-08 DIAGNOSIS — Z79899 Other long term (current) drug therapy: Secondary | ICD-10-CM | POA: Diagnosis not present

## 2018-01-08 DIAGNOSIS — Z8249 Family history of ischemic heart disease and other diseases of the circulatory system: Secondary | ICD-10-CM | POA: Insufficient documentation

## 2018-01-08 DIAGNOSIS — F17210 Nicotine dependence, cigarettes, uncomplicated: Secondary | ICD-10-CM | POA: Insufficient documentation

## 2018-01-08 DIAGNOSIS — R1319 Other dysphagia: Secondary | ICD-10-CM

## 2018-01-08 DIAGNOSIS — F329 Major depressive disorder, single episode, unspecified: Secondary | ICD-10-CM | POA: Insufficient documentation

## 2018-01-08 DIAGNOSIS — Z86718 Personal history of other venous thrombosis and embolism: Secondary | ICD-10-CM | POA: Diagnosis not present

## 2018-01-08 DIAGNOSIS — Z882 Allergy status to sulfonamides status: Secondary | ICD-10-CM | POA: Diagnosis not present

## 2018-01-08 DIAGNOSIS — J449 Chronic obstructive pulmonary disease, unspecified: Secondary | ICD-10-CM | POA: Diagnosis not present

## 2018-01-08 HISTORY — PX: ESOPHAGOGASTRODUODENOSCOPY (EGD) WITH PROPOFOL: SHX5813

## 2018-01-08 SURGERY — ESOPHAGOGASTRODUODENOSCOPY (EGD) WITH PROPOFOL
Anesthesia: General

## 2018-01-08 MED ORDER — SODIUM CHLORIDE 0.9 % IV SOLN
INTRAVENOUS | Status: DC
  Administered 2018-01-08: 07:00:00 via INTRAVENOUS

## 2018-01-08 MED ORDER — PROPOFOL 10 MG/ML IV BOLUS
INTRAVENOUS | Status: AC
  Filled 2018-01-08: qty 20

## 2018-01-08 MED ORDER — PROPOFOL 10 MG/ML IV BOLUS
INTRAVENOUS | Status: DC | PRN
  Administered 2018-01-08: 80 mg via INTRAVENOUS

## 2018-01-08 MED ORDER — PROPOFOL 500 MG/50ML IV EMUL
INTRAVENOUS | Status: DC | PRN
  Administered 2018-01-08: 100 ug/kg/min via INTRAVENOUS

## 2018-01-08 MED ORDER — SUCRALFATE 1 G PO TABS: 1.0000 g | ORAL_TABLET | Freq: Four times a day (QID) | ORAL | 1 refills | Status: DC

## 2018-01-08 MED ORDER — LIDOCAINE HCL (CARDIAC) 20 MG/ML IV SOLN
INTRAVENOUS | Status: DC | PRN
  Administered 2018-01-08: 40 mg via INTRAVENOUS

## 2018-01-08 MED ORDER — LIDOCAINE HCL (PF) 2 % IJ SOLN
INTRAMUSCULAR | Status: AC
Start: 2018-01-08 — End: 2018-01-08
  Filled 2018-01-08: qty 10

## 2018-01-08 NOTE — Procedures (Addendum)
At the end of the procedure, the 18 mm balloon easily slid thru the area of stenosis.  No bleeding.

## 2018-01-08 NOTE — H&P (Signed)
Distal esophageal stricture with progressive weight loss. Minimal improvement after initial dilatation 2 weeks ago. Final trial of dilatation prior to referal for takedown of fundoplication.  No change in health status since last office visit.

## 2018-01-08 NOTE — Anesthesia Postprocedure Evaluation (Signed)
Anesthesia Post Note  Patient: Tamara [name]  Procedure(s) Performed: ESOPHAGOGASTRODUODENOSCOPY (EGD) WITH PROPOFOL (N/A )  Patient location during evaluation: PACU Anesthesia Type: General Level of consciousness: awake and alert Pain management: pain level controlled Vital Signs Assessment: post-procedure vital signs reviewed and stable Respiratory status: spontaneous breathing, nonlabored ventilation, respiratory function stable and patient connected to nasal cannula oxygen Cardiovascular status: blood pressure returned to baseline and stable Postop Assessment: no apparent nausea or vomiting Anesthetic complications: no     Last Vitals:  Vitals:   01/08/18 0820 01/08/18 0830  BP: 112/69 116/70  Pulse: 82 80  Resp: 16 19  Temp:    SpO2: 97% 97%    Last Pain:  Vitals:   01/08/18 0800  TempSrc: Tympanic                 Molli Barrows

## 2018-01-08 NOTE — Anesthesia Preprocedure Evaluation (Signed)
Anesthesia Evaluation  Patient identified by MRN, date of birth, ID band Patient awake    Reviewed: Allergy & Precautions, H&P , NPO status , Patient's Chart, lab work & pertinent test results, reviewed documented beta blocker date and time   Airway Mallampati: II   Neck ROM: full    Dental  (+) Poor Dentition   Pulmonary neg pulmonary ROS, COPD,  COPD inhaler, Current Smoker,    Pulmonary exam normal        Cardiovascular Exercise Tolerance: Good negative cardio ROS Normal cardiovascular exam Rhythm:regular Rate:Normal     Neuro/Psych PSYCHIATRIC DISORDERS Depression  Neuromuscular disease negative neurological ROS  negative psych ROS   GI/Hepatic negative GI ROS, Neg liver ROS,   Endo/Other  negative endocrine ROS  Renal/GU negative Renal ROS  negative genitourinary   Musculoskeletal   Abdominal   Peds  Hematology negative hematology ROS (+)   Anesthesia Other Findings Past Medical History: No date: Bowel obstruction ([place])     Comment:  s/p laparotomy/ LOA 3/08, prior reversal of jejunal               bypasss 2004 No date: Chronic abdominal pain No date: COPD (chronic obstructive pulmonary disease) ([facility]) No date: Crohn's disease ([place]) No date: Depression 01/2007: History of DVT (deep vein thrombosis)     Comment:  post operative right leg, s/p vena cava filter No date: Osteoporosis Past Surgical History: No date: ABDOMINAL HYSTERECTOMY No date: APPENDECTOMY No date: CATARACT EXTRACTION 2005: CERVICAL FUSION     Comment:  C6-7, anterior approach No date: CHOLECYSTECTOMY 2015: COLONOSCOPY 12/17/2017: ESOPHAGOGASTRODUODENOSCOPY (EGD) WITH PROPOFOL; N/A     Comment:  Procedure: ESOPHAGOGASTRODUODENOSCOPY (EGD) WITH               PROPOFOL;  Surgeon: Robert Bellow, MD;  Location:               [facility];  Service: Endoscopy;  Laterality: N/A; No date: gyn surgery     Comment:  hysterectomy 01/30/07:  LAPAROTOMY     Comment:  for bowel obstruction with LOA 12/25/2016: LUMBAR LAMINECTOMY/DECOMPRESSION MICRODISCECTOMY; N/A     Comment:  Procedure: LUMBAR DECOMPRESSION L4-5;  Surgeon: Meade Maw, MD;  Location: [facility] ORS;  Service:               Neurosurgery;  Laterality: N/A; No date: NISSEN FUNDOPLICATION No date: ROTATOR CUFF REPAIR     Comment:  right No date: SPLENECTOMY     Comment:  secondary to colonoscopy prep No date: TONSILLECTOMY BMI    Body Mass Index:  20.80 kg/m     Reproductive/Obstetrics negative OB ROS                             Anesthesia Physical Anesthesia Plan  ASA: III  Anesthesia Plan: General   Post-op Pain Management:    Induction:   PONV Risk Score and Plan:   Airway Management Planned:   Additional Equipment:   Intra-op Plan:   Post-operative Plan:   Informed Consent: I have reviewed the patients History and Physical, chart, labs and discussed the procedure including the risks, benefits and alternatives for the proposed anesthesia with the patient or authorized representative who has indicated his/her understanding and acceptance.   Dental Advisory Given  Plan Discussed with: CRNA  Anesthesia Plan Comments:  Anesthesia Quick Evaluation

## 2018-01-08 NOTE — Transfer of Care (Signed)
Immediate Anesthesia Transfer of Care Note  Patient: Tamara [name]  Procedure(s) Performed: ESOPHAGOGASTRODUODENOSCOPY (EGD) WITH PROPOFOL (N/A )  Patient Location: PACU and Endoscopy Unit  Anesthesia Type:General  Level of Consciousness: awake  Airway & Oxygen Therapy: Patient Spontanous Breathing  Post-op Assessment: Report given to RN  Post vital signs: stable  Last Vitals:  Vitals:   01/08/18 0704 01/08/18 0706  BP: 128/75   Pulse: (!) 110   Resp: 18   Temp: (!) 35.6 C   SpO2:  99%    Last Pain:  Vitals:   01/08/18 0704  TempSrc: Tympanic         Complications: No apparent anesthesia complications

## 2018-01-08 NOTE — Anesthesia Post-op Follow-up Note (Signed)
Anesthesia QCDR form completed.        

## 2018-01-08 NOTE — Op Note (Addendum)
[facility] Patient Name: Tamara [name] Procedure Date: 01/08/2018 7:24 AM MRN: [id number] Account #: [id number] Date of Birth: 1946-06-28 Admit Type: Outpatient Age: 72 Room: [facility] ENDO ROOM 4 Gender: Female Note Status: Finalized Procedure:            Upper GI endoscopy Indications:          Dysphagia Providers:            Robert Bellow, MD Referring MD:         Deborra Medina, MD (Referring MD) Medicines:            Monitored Anesthesia Care Complications:        No immediate complications. Procedure:            Pre-Anesthesia Assessment:                       - Prior to the procedure, a History and Physical was                        performed, and patient medications, allergies and                        sensitivities were reviewed. The patient's tolerance of                        previous anesthesia was reviewed.                       - The risks and benefits of the procedure and the                        sedation options and risks were discussed with the                        patient. All questions were answered and informed                        consent was obtained.                       After obtaining informed consent, the endoscope was                        passed under direct vision. Throughout the procedure,                        the patient's blood pressure, pulse, and oxygen                        saturations were monitored continuously. The Endoscope                        was introduced through the mouth, and advanced to the                        duodenal bulb. The upper GI endoscopy was accomplished                        without difficulty. The patient tolerated the procedure  well. Findings:      One moderate (circumferential scarring or stenosis; an endoscope may       pass) benign-appearing, intrinsic stenosis was found 37 cm from the       incisors. This measured 1 cm (inner diameter) x 1  cm (in length) and was       traversed. Plaque-like exudate note in areas of previous biopsy.      A TTS dilator was passed through the scope. Dilation with a 15-16.5-18       mm balloon dilator was performed to 18 mm. The dilation site was       examined following endoscope reinsertion and showed moderate improvement       in luminal narrowing.      After dilatation, the 18 mm inflated balloon easily passed thru the area       of stricture. No bleeding noted.      The stomach was normal.      The examined duodenum was normal. Impression:           - Benign-appearing esophageal stenosis. Dilated.                       - Normal stomach.                       - Normal examined duodenum.                       - No specimens collected. Recommendation:       - Full liquid diet for 2 days. Robert Bellow, MD 01/08/2018 7:57:48 AM This report has been signed electronically. Number of Addenda: 0 Note Initiated On: 01/08/2018 7:24 AM      [facility]

## 2018-01-09 ENCOUNTER — Encounter: Payer: Self-pay | Admitting: General Surgery

## 2018-01-14 ENCOUNTER — Telehealth: Payer: Self-pay | Admitting: *Deleted

## 2018-01-14 NOTE — Telephone Encounter (Signed)
She called to let Dr Bary Castilla know that she does not think she is any better or maybe just "a little bit". She states food is still getting "hung" and "not going down right." she is drinking and eating cream soups "ok". She is using the Carafate as prescribed. She has a follow up next Tuesday and will discuss with you further at that time.

## 2018-01-14 NOTE — Telephone Encounter (Signed)
Records faxed to Dr. Bernette Mayers office. They will contact the patient directly with an appointment.   Patient notified of this per Rosann Auerbach and she verbalizes understanding.

## 2018-01-14 NOTE — Telephone Encounter (Signed)
Patient needs referral to Dr Duke Salvia

## 2018-01-15 DIAGNOSIS — Z9889 Other specified postprocedural states: Secondary | ICD-10-CM | POA: Diagnosis not present

## 2018-01-15 DIAGNOSIS — Z961 Presence of intraocular lens: Secondary | ICD-10-CM | POA: Diagnosis not present

## 2018-01-20 ENCOUNTER — Ambulatory Visit: Payer: Medicare Other | Admitting: General Surgery

## 2018-01-21 ENCOUNTER — Encounter: Payer: Self-pay | Admitting: Internal Medicine

## 2018-01-21 ENCOUNTER — Ambulatory Visit (INDEPENDENT_AMBULATORY_CARE_PROVIDER_SITE_OTHER): Payer: Medicare Other | Admitting: Internal Medicine

## 2018-01-21 VITALS — BP 110/62 | HR 102 | Temp 98.1°F | Resp 15 | Ht 59.0 in | Wt 105.4 lb

## 2018-01-21 DIAGNOSIS — R636 Underweight: Secondary | ICD-10-CM

## 2018-01-21 DIAGNOSIS — R6889 Other general symptoms and signs: Secondary | ICD-10-CM | POA: Diagnosis not present

## 2018-01-21 DIAGNOSIS — R1310 Dysphagia, unspecified: Secondary | ICD-10-CM | POA: Diagnosis not present

## 2018-01-21 DIAGNOSIS — E441 Mild protein-calorie malnutrition: Secondary | ICD-10-CM

## 2018-01-21 DIAGNOSIS — B9789 Other viral agents as the cause of diseases classified elsewhere: Secondary | ICD-10-CM

## 2018-01-21 DIAGNOSIS — R0981 Nasal congestion: Secondary | ICD-10-CM | POA: Diagnosis not present

## 2018-01-21 DIAGNOSIS — K2100 Gastro-esophageal reflux disease with esophagitis, without bleeding: Secondary | ICD-10-CM

## 2018-01-21 DIAGNOSIS — D7589 Other specified diseases of blood and blood-forming organs: Secondary | ICD-10-CM | POA: Diagnosis not present

## 2018-01-21 DIAGNOSIS — J069 Acute upper respiratory infection, unspecified: Secondary | ICD-10-CM | POA: Diagnosis not present

## 2018-01-21 DIAGNOSIS — R1319 Other dysphagia: Secondary | ICD-10-CM

## 2018-01-21 LAB — POCT INFLUENZA A/B
INFLUENZA A, POC: NEGATIVE
INFLUENZA B, POC: NEGATIVE

## 2018-01-21 MED ORDER — PSEUDOEPHEDRINE-CODEINE-GG 30-10-100 MG/5ML PO SOLN: 10.0000 mL | Freq: Four times a day (QID) | ORAL | 0 refills | Status: DC | PRN

## 2018-01-21 MED ORDER — CLONAZEPAM 0.5 MG PO TABS: 0.5000 mg | ORAL_TABLET | Freq: Every day | ORAL | 5 refills | Status: DC | PRN

## 2018-01-21 MED ORDER — METHYLPREDNISOLONE ACETATE 40 MG/ML IJ SUSP
40.0000 mg | Freq: Once | INTRAMUSCULAR | Status: AC
  Administered 2018-01-21: 40 mg via INTRAMUSCULAR

## 2018-01-21 MED ORDER — DEXLANSOPRAZOLE 60 MG PO CPDR: 60.0000 mg | DELAYED_RELEASE_CAPSULE | Freq: Every day | ORAL | 2 refills | Status: DC

## 2018-01-21 NOTE — Progress Notes (Signed)
Subjective:  Patient ID: Tamara [name], female    DOB: June 03, 1946  Age: 72 y.o. MRN: [id number]  CC: The primary encounter diagnosis was Flu-like symptoms. Diagnoses of Sinus congestion, Underweight, Mild protein-calorie malnutrition ([place]), Macrocytosis, Esophageal dysphagia, Reflux esophagitis, and Viral URI with cough were also pertinent to this visit.  Tamara [name] presents for URI symptoms lasting 3 days,  Including cough,  Rhinitis, and sore throat . Denies fevers, chills sinus pain and ear pain.     She has had chronic dysphagia for nearly a year.  Had  an  attempted esophageal dilation 2 weeks ago by [facility] which was unsuccessful. She is on a liquid diet and losing weight. She is unable to drink 6 cans of Boost daily . She is averaging one milk shake daily , which is quantified as a  16 ounce .  Cannot swallow mashed potatoes Has lost 6 lbs since last visit in August 2018.  EGD reviewed:  path report og esophageal biopsy :  not fungal nor dysplastic,  Changes c/w reflux esophagitis She is taking omeprazole  20 mg twice daily.    She has a history of nissen funduplication by [employer] Guard in the 80's  For hiatal hernia and has been referred to a bariatric surgeon who specializes in reversing Nissen funduplications.  Labs reviewed.  She has a chronic macrocytic anemia:  Normal b12 and folate,  However, her B12 was < 300 however  In August        Throat irritated but no thrush and no ulceration     Outpatient Medications Prior to Visit  Medication Sig Dispense Refill  . nortriptyline (PAMELOR) 25 MG capsule TAKE 1 CAPSULE BY MOUTH AT BEDTIME 90 capsule 1  . omeprazole (PRILOSEC) 20 MG capsule Take 20 mg by mouth daily.    . sertraline (ZOLOFT) 100 MG tablet Take 1 tablet (100 mg total) by mouth daily. 30 tablet 5  . sucralfate (CARAFATE) 1 g tablet Take 1 tablet (1 g total) by mouth 4 (four) times daily. Dissolve in water and take before meals and at bedtime. 120 tablet 1    . traZODone (DESYREL) 100 MG tablet TAKE 1 TABLET BY MOUTH AT BEDTIME 30 tablet 3  . clonazePAM (KLONOPIN) 0.5 MG tablet Take 1 tablet (0.5 mg total) by mouth daily as needed for anxiety. 30 tablet 0   No facility-administered medications prior to visit.     Review of Systems;  Patient denies headache, fevers, malaise, unintentional weight loss, skin rash, eye pain, sinus congestion and sinus pain, sore throat, dysphagia,  hemoptysis , cough, dyspnea, wheezing, chest pain, palpitations, orthopnea, edema, abdominal pain, nausea, melena, diarrhea, constipation, flank pain, dysuria, hematuria, urinary  Frequency, nocturia, numbness, tingling, seizures,  Focal weakness, Loss of consciousness,  Tremor, insomnia, depression, anxiety, and suicidal ideation.      Objective:  BP 110/62 (BP Location: Left Arm, Patient Position: Sitting, Cuff Size: Normal)   Pulse (!) 102   Temp 98.1 F (36.7 C) (Oral)   Resp 15   Ht 4' 11"  (1.499 m)   Wt 105 lb 6.4 oz (47.8 kg)   SpO2 97%   BMI 21.29 kg/m   BP Readings from Last 3 Encounters:  01/21/18 110/62  01/08/18 116/70  01/01/18 116/68    Wt Readings from Last 3 Encounters:  01/21/18 105 lb 6.4 oz (47.8 kg)  01/08/18 103 lb (46.7 kg)  01/01/18 107 lb (48.5 kg)    General appearance: alert, cooperative and appears  stated age Ears: normal TM's and external ear canals both ears Throat: lips, mucosa, and tongue normal; teeth and gums normal Neck: no adenopathy, no carotid bruit, supple, symmetrical, trachea midline and thyroid not enlarged, symmetric, no tenderness/mass/nodules Back: symmetric, no curvature. ROM normal. No CVA tenderness. Lungs: clear to auscultation bilaterally Heart: regular rate and rhythm, S1, S2 normal, no murmur, click, rub or gallop Abdomen: soft, non-tender; bowel sounds normal; no masses,  no organomegaly Pulses: 2+ and symmetric Skin: Skin color, texture, turgor normal. No rashes or lesions Lymph nodes: Cervical,  supraclavicular, and axillary nodes normal.  No results found for: HGBA1C  Lab Results  Component Value Date   CREATININE 0.89 12/02/2017   CREATININE 0.77 06/11/2017   CREATININE 0.85 12/18/2016    Lab Results  Component Value Date   WBC 8.7 12/02/2017   HGB 15.8 12/02/2017   HCT 45.5 12/02/2017   PLT 270 12/02/2017   GLUCOSE 80 12/02/2017   CHOL 189 06/11/2017   TRIG 172.0 (H) 06/11/2017   HDL 67.20 06/11/2017   LDLDIRECT 121.1 01/26/2014   LDLCALC 87 06/11/2017   ALT 25 12/02/2017   AST 31 12/02/2017   NA 139 12/02/2017   K 4.4 12/02/2017   CL 107 12/02/2017   CREATININE 0.89 12/02/2017   BUN 9 12/02/2017   CO2 23 12/02/2017   TSH 1.74 06/11/2017   INR 0.85 12/18/2016    No results found.  Assessment & Plan:   Problem List Items Addressed This Visit    Dysphagia    Secondary to reflux esophagitis and Nissen funduplication .  Increase omeprazole to 40 mg twice dailly.  Dexilant was not covered by insurance despite appearances       Macrocytosis    Hher b12 level was around 300.  Will recommend IM supplementation.  Lab Results  Component Value Date   [id number] 672 12/02/2017   Lab Results  Component Value Date   FOLATE 21.2 12/02/2017         Reflux esophagitis    Confirmed by biopsy .  Increase omeprazole to 40 mg bid       Underweight    Chronic but Worsening,  Secondary to dysphagia for solids.  She states that she is unable to consume more than 16 ounces of protein based dr.  Aletha Halim  Refer to Nutritional dietician for assistance in maximizing her nutritional intake.  She has an appointment with a bariatric surgeon in the near future as well.       Relevant Orders   Amb ref to Medical Nutrition Therapy-MNT   Viral URI with cough    URI is most likely viral given the mild HEENT Symptoms  And normal exam.   I have explained that in viral URIS, an antibiotic will not help the symptoms and will increase the risk of developing diarrhea.,  Continue  oral and nasal decongestants,prn tylenol 650 mq 8 hrs for aches and pains,  Sinus flushes with Milta Deiters Med's rinse,  prednisone  Injection for inflammation, and cough suppressant.   (she does not tolerate prednisone tapers).  Advised to start antibiotic only if symptoms of bacterial sinusitis occur, and advised to take probiotic if the antibiotic is taken,  For a minimum of 3 weeks.        Other Visit Diagnoses    Flu-like symptoms    -  Primary   Relevant Orders   POCT Influenza A/B (Completed)   Sinus congestion       Relevant Medications  methylPREDNISolone acetate (DEPO-MEDROL) injection 40 mg (Completed)   Mild protein-calorie malnutrition ([place])       Relevant Orders   Amb ref to Medical Nutrition Therapy-MNT      I am having Tamara [name] start on dexlansoprazole and pseudoephedrine-codeine-guaifenesin. I am also having her maintain her sertraline, nortriptyline, traZODone, omeprazole, sucralfate, and clonazePAM. We administered methylPREDNISolone acetate.  Meds ordered this encounter  Medications  . dexlansoprazole (DEXILANT) 60 MG capsule    Sig: Take 1 capsule (60 mg total) by mouth daily.    Dispense:  30 capsule    Refill:  2  . pseudoephedrine-codeine-guaifenesin (MYTUSSIN DAC) 30-10-100 MG/5ML solution    Sig: Take 10 mLs by mouth 4 (four) times daily as needed for cough.    Dispense:  120 mL    Refill:  0  . clonazePAM (KLONOPIN) 0.5 MG tablet    Sig: Take 1 tablet (0.5 mg total) by mouth daily as needed for anxiety.    Dispense:  30 tablet    Refill:  5  . methylPREDNISolone acetate (DEPO-MEDROL) injection 40 mg    Medications Discontinued During This Encounter  Medication Reason  . clonazePAM (KLONOPIN) 0.5 MG tablet Reorder    Follow-up: No Follow-up on file.   Crecencio Mc, MD

## 2018-01-21 NOTE — Patient Instructions (Addendum)
You have a viral syndrome which is causing bronchitis and sinus congestion .    The post nasal drip may also be contributing to  your  Cough.  I am prescribing a prednisone injection  to manage the inflammation in your bronchial tubes and ear     Cheratussin  with PE for  cough  And congestion    try adding Premier Protein to  Your ice cream for a high protein  shake    Trial of Dexilant once daily instead of omeprazole ( if it is affordable) to treat eyour reflux esophagitis

## 2018-01-24 DIAGNOSIS — K2100 Gastro-esophageal reflux disease with esophagitis, without bleeding: Secondary | ICD-10-CM

## 2018-01-24 HISTORY — DX: Gastro-esophageal reflux disease with esophagitis, without bleeding: K21.00

## 2018-01-24 NOTE — Assessment & Plan Note (Signed)
Confirmed by biopsy .  Increase omeprazole to 40 mg bid

## 2018-01-24 NOTE — Assessment & Plan Note (Signed)
Hher b12 level was around 300.  Will recommend IM supplementation.  Lab Results  Component Value Date   [id number] 740 12/02/2017   Lab Results  Component Value Date   FOLATE 21.2 12/02/2017

## 2018-01-24 NOTE — Assessment & Plan Note (Signed)
Chronic but Worsening,  Secondary to dysphagia for solids.  She states that she is unable to consume more than 16 ounces of protein based dr.  Aletha Halim  Refer to Nutritional dietician for assistance in maximizing her nutritional intake.  She has an appointment with a bariatric surgeon in the near future as well.

## 2018-01-24 NOTE — Assessment & Plan Note (Addendum)
Secondary to reflux esophagitis and Nissen funduplication .  Increase omeprazole to 40 mg twice dailly.  Dexilant was not covered by insurance despite appearances

## 2018-01-24 NOTE — Assessment & Plan Note (Addendum)
URI is most likely viral given the mild HEENT Symptoms  And normal exam.   I have explained that in viral URIS, an antibiotic will not help the symptoms and will increase the risk of developing diarrhea.,  Continue oral and nasal decongestants,prn tylenol 650 mq 8 hrs for aches and pains,  Sinus flushes with Milta Deiters Med's rinse,  prednisone  Injection for inflammation, and cough suppressant.   (she does not tolerate prednisone tapers).  Advised to start antibiotic only if symptoms of bacterial sinusitis occur, and advised to take probiotic if the antibiotic is taken,  For a minimum of 3 weeks.

## 2018-01-25 ENCOUNTER — Encounter: Payer: Self-pay | Admitting: Internal Medicine

## 2018-01-26 ENCOUNTER — Ambulatory Visit (INDEPENDENT_AMBULATORY_CARE_PROVIDER_SITE_OTHER): Payer: Medicare Other | Admitting: *Deleted

## 2018-01-26 ENCOUNTER — Encounter: Payer: Self-pay | Admitting: *Deleted

## 2018-01-26 ENCOUNTER — Ambulatory Visit: Payer: Self-pay

## 2018-01-26 DIAGNOSIS — R1310 Dysphagia, unspecified: Secondary | ICD-10-CM

## 2018-01-26 DIAGNOSIS — B9789 Other viral agents as the cause of diseases classified elsewhere: Secondary | ICD-10-CM

## 2018-01-26 DIAGNOSIS — J069 Acute upper respiratory infection, unspecified: Secondary | ICD-10-CM | POA: Diagnosis not present

## 2018-01-26 DIAGNOSIS — K222 Esophageal obstruction: Secondary | ICD-10-CM

## 2018-01-26 DIAGNOSIS — R1319 Other dysphagia: Secondary | ICD-10-CM

## 2018-01-26 MED ORDER — AMOXICILLIN-POT CLAVULANATE 875-125 MG PO TABS: 1.0000 | ORAL_TABLET | Freq: Two times a day (BID) | ORAL | 0 refills | Status: DC

## 2018-01-26 MED ORDER — METHYLPREDNISOLONE ACETATE 40 MG/ML IJ SUSP
40.0000 mg | Freq: Once | INTRAMUSCULAR | Status: AC
  Administered 2018-01-26: 40 mg via INTRAMUSCULAR

## 2018-01-26 MED ORDER — PSEUDOEPHEDRINE-CODEINE-GG 30-10-100 MG/5ML PO SOLN: 10.0000 mL | Freq: Four times a day (QID) | ORAL | 0 refills | Status: DC | PRN

## 2018-01-26 NOTE — Progress Notes (Signed)
See My chart message from 01/25/18 patient presented for DepoMedrol injection 40 mg for Viral URI. Patient tolerated injection well.

## 2018-01-26 NOTE — Telephone Encounter (Signed)
Pt is going to come in today for another 52m depo medrol injection at 11:30am. Pt is aware that we are refilling the cough syrup and sending in a rx for an antibiotic.

## 2018-01-26 NOTE — Telephone Encounter (Signed)
Attempted to call pt x 3 with no return call.

## 2018-01-26 NOTE — Progress Notes (Signed)
Patient has been scheduled for an appointment with Dr. Duke Salvia on 02-02-18 at 9:40 am. The patient has been notified of the appointment per their office.

## 2018-01-26 NOTE — Telephone Encounter (Signed)
She can come in for another Depo Medrol injection since she does not tolerate oral tapers,  You can give her the cough syrup refill.   And I will call in augmentin to tke bid for 7 days

## 2018-01-26 NOTE — Telephone Encounter (Signed)
Patient called, unable to leave VM, no answering service set up on the [phone] number and the [phone] said the number has been changed or disconnected.

## 2018-01-27 ENCOUNTER — Telehealth: Payer: Self-pay | Admitting: Internal Medicine

## 2018-01-27 ENCOUNTER — Encounter: Payer: Self-pay | Admitting: Internal Medicine

## 2018-01-27 MED ORDER — PROMETHAZINE-PHENYLEPHRINE 6.25-5 MG/5ML PO SYRP: 5.0000 mL | ORAL_SOLUTION | Freq: Three times a day (TID) | ORAL | 0 refills | Status: DC | PRN

## 2018-01-27 NOTE — Telephone Encounter (Signed)
Med sent,   mychart message sent

## 2018-01-27 NOTE — Telephone Encounter (Signed)
Cough medication prescribed 01/26/18 not covered by insurance.pseudoephedrine-codeine-guaifenesin (MYTUSSIN DAC) 30-10-100 MG/5ML solution

## 2018-01-27 NOTE — Telephone Encounter (Signed)
LOV 01/21/18. See pharmacy request.

## 2018-01-27 NOTE — Telephone Encounter (Signed)
Copied from [place] [phone]. Topic: Quick Communication - See Telephone Encounter >> Jan 27, 2018  1:48 PM Corie Chiquito, [place] wrote: CRM for notification. Lovey Newcomer is calling from the pharmacy because the patients [place] 30-10-100 ML/5ML isn't covered under her insurance and would like to know if there is a different cough medicine that she could have instead. She still would need the medication to go to the [facility] on [address] 01/27/18.

## 2018-01-28 ENCOUNTER — Encounter: Payer: Self-pay | Admitting: Internal Medicine

## 2018-01-28 ENCOUNTER — Telehealth: Payer: Self-pay

## 2018-01-28 MED ORDER — GUAIFENESIN-CODEINE 100-10 MG/5ML PO SYRP: 5.0000 mL | ORAL_SOLUTION | Freq: Three times a day (TID) | ORAL | 0 refills | Status: DC | PRN

## 2018-01-28 NOTE — Telephone Encounter (Unsigned)
Copied from [place] [phone]. Topic: Quick Communication - See Telephone Encounter >> Jan 27, 2018  1:48 PM Corie Chiquito, [place] wrote: CRM for notification. Lovey Newcomer is calling from the pharmacy because the patients [place] 30-10-100 ML/5ML isn't covered under her insurance and would like to know if there is a different cough medicine that she could have instead. She still would need the medication to go to the [facility] [phone] 01/27/18. >> Jan 28, 2018 10:18 AM Ahmed Prima L wrote: [facility] said that either of the scripts that have been sent over are to pricey and she needs something cheaper. Call back is [phone]

## 2018-01-28 NOTE — Telephone Encounter (Signed)
Copied from [place] [phone]. Topic: Quick Communication - See Telephone Encounter >> Jan 27, 2018  1:48 PM Corie Chiquito, [place] wrote: CRM for notification. Lovey Newcomer is calling from the pharmacy because the patients [place] 30-10-100 ML/5ML isn't covered under her insurance and would like to know if there is a different cough medicine that she could have instead. She still would need the medication to go to the [place] [phone] 01/27/18. >> Jan 28, 2018 10:18 AM Ahmed Prima L wrote: [facility] said that either of the scripts that have been sent over are to pricey and she needs something cheaper. Call back is [phone]

## 2018-01-28 NOTE — Telephone Encounter (Signed)
Please fax asap . If not covered ask pharmacy to make a suggestion  As to wHAT IS COVERED?

## 2018-01-28 NOTE — Telephone Encounter (Signed)
Rx has been faxed. Pt is aware and was told that if this medication is not covered by her insurance than she will need to ask the pharmacy what is covered by her insurance and they let us know so we can send that in. Pt gave a verbal understanding.

## 2018-01-28 NOTE — Addendum Note (Signed)
Addended by: Crecencio Mc on: 01/28/2018 03:09 PM   Modules accepted: Orders

## 2018-01-28 NOTE — Telephone Encounter (Addendum)
Pt calling back because she is needing a new cough med called in.  Pt states the first cough med was covered, except it does not have a decongestant. Pt states she really needs something because the cough is very bad. [facility] [place] [place] [address] [phone] (Phone) [phone] (Fax)

## 2018-01-28 NOTE — Telephone Encounter (Signed)
Please see phone encounter. Thank you

## 2018-02-02 DIAGNOSIS — K222 Esophageal obstruction: Secondary | ICD-10-CM | POA: Diagnosis not present

## 2018-02-02 DIAGNOSIS — Z9889 Other specified postprocedural states: Secondary | ICD-10-CM | POA: Diagnosis not present

## 2018-02-02 DIAGNOSIS — Z9081 Acquired absence of spleen: Secondary | ICD-10-CM | POA: Diagnosis not present

## 2018-02-11 ENCOUNTER — Encounter
Admission: RE | Disposition: A | Discharge status: Home / Self Care | Payer: Self-pay | Source: Ambulatory Visit | Attending: Gastroenterology

## 2018-02-11 ENCOUNTER — Ambulatory Visit
Admission: RE | Admit: 2018-02-11 | Discharge: 2018-02-11 | Disposition: A | Discharge status: Home / Self Care | Payer: Medicare Other | Source: Ambulatory Visit | Attending: Gastroenterology | Admitting: Gastroenterology

## 2018-02-11 DIAGNOSIS — K224 Dyskinesia of esophagus: Secondary | ICD-10-CM | POA: Insufficient documentation

## 2018-02-11 DIAGNOSIS — K222 Esophageal obstruction: Secondary | ICD-10-CM | POA: Diagnosis not present

## 2018-02-11 HISTORY — PX: ESOPHAGEAL MANOMETRY: SHX5429

## 2018-02-11 SURGERY — MANOMETRY, ESOPHAGUS

## 2018-02-11 MED ORDER — BUTAMBEN-TETRACAINE-BENZOCAINE 2-2-14 % EX AERO
INHALATION_SPRAY | CUTANEOUS | Status: AC
  Filled 2018-02-11: qty 5

## 2018-02-11 MED ORDER — LIDOCAINE HCL 2 % EX GEL
CUTANEOUS | Status: AC
  Filled 2018-02-11: qty 5

## 2018-02-11 SURGICAL SUPPLY — 2 items
FACESHIELD LNG OPTICON STERILE (SAFETY) IMPLANT
GLOVE BIO SURGEON STRL SZ8 (GLOVE) ×4 IMPLANT

## 2018-02-13 ENCOUNTER — Encounter: Payer: Self-pay | Admitting: Gastroenterology

## 2018-02-17 ENCOUNTER — Telehealth: Payer: Self-pay | Admitting: Gastroenterology

## 2018-02-17 NOTE — Telephone Encounter (Signed)
Please review pt's manometry report. Dr. Duke Salvia is waiting on those results for the next step due to persistent nausea.

## 2018-02-17 NOTE — Telephone Encounter (Signed)
Crystal from dr. Dana Allan office is calling for test results on pt please call at [phone] She states this number is her personal number

## 2018-02-18 NOTE — Telephone Encounter (Signed)
Pt is calling again for results

## 2018-02-20 DIAGNOSIS — Z79899 Other long term (current) drug therapy: Secondary | ICD-10-CM | POA: Diagnosis not present

## 2018-02-20 DIAGNOSIS — R1310 Dysphagia, unspecified: Secondary | ICD-10-CM | POA: Diagnosis not present

## 2018-02-20 DIAGNOSIS — K222 Esophageal obstruction: Secondary | ICD-10-CM | POA: Diagnosis not present

## 2018-02-20 DIAGNOSIS — K9189 Other postprocedural complications and disorders of digestive system: Secondary | ICD-10-CM | POA: Diagnosis not present

## 2018-02-20 DIAGNOSIS — Z9884 Bariatric surgery status: Secondary | ICD-10-CM | POA: Diagnosis not present

## 2018-02-20 DIAGNOSIS — K449 Diaphragmatic hernia without obstruction or gangrene: Secondary | ICD-10-CM | POA: Diagnosis not present

## 2018-02-20 DIAGNOSIS — E46 Unspecified protein-calorie malnutrition: Secondary | ICD-10-CM | POA: Insufficient documentation

## 2018-02-20 DIAGNOSIS — E43 Unspecified severe protein-calorie malnutrition: Secondary | ICD-10-CM | POA: Diagnosis not present

## 2018-02-20 DIAGNOSIS — E441 Mild protein-calorie malnutrition: Secondary | ICD-10-CM | POA: Diagnosis not present

## 2018-02-20 DIAGNOSIS — K224 Dyskinesia of esophagus: Secondary | ICD-10-CM | POA: Diagnosis not present

## 2018-02-20 DIAGNOSIS — Z9081 Acquired absence of spleen: Secondary | ICD-10-CM | POA: Diagnosis not present

## 2018-02-24 DIAGNOSIS — E441 Mild protein-calorie malnutrition: Secondary | ICD-10-CM | POA: Diagnosis not present

## 2018-02-24 DIAGNOSIS — K224 Dyskinesia of esophagus: Secondary | ICD-10-CM | POA: Diagnosis not present

## 2018-02-24 DIAGNOSIS — Z9884 Bariatric surgery status: Secondary | ICD-10-CM | POA: Diagnosis not present

## 2018-02-24 MED ORDER — PANTOPRAZOLE SODIUM 40 MG IV SOLR
40.00 | INTRAVENOUS | Status: DC
Start: 2018-02-25 — End: 2018-02-24

## 2018-02-24 MED ORDER — ACETAMINOPHEN 325 MG PO TABS
650.00 | ORAL_TABLET | ORAL | Status: DC
Start: ? — End: 2018-02-24

## 2018-02-24 MED ORDER — SERTRALINE HCL 100 MG PO TABS
100.00 | ORAL_TABLET | ORAL | Status: DC
Start: 2018-02-24 — End: 2018-02-24

## 2018-02-24 MED ORDER — TRAZODONE HCL 50 MG PO TABS
50.00 | ORAL_TABLET | ORAL | Status: DC
Start: 2018-02-24 — End: 2018-02-24

## 2018-02-24 MED ORDER — OXYCODONE HCL 5 MG/5ML PO SOLN
5.00 | ORAL | Status: DC
Start: ? — End: 2018-02-24

## 2018-02-24 MED ORDER — GUAIFENESIN-DM 100-10 MG/5ML PO SYRP
5.00 | ORAL_SOLUTION | ORAL | Status: DC
Start: ? — End: 2018-02-24

## 2018-02-24 MED ORDER — CLONAZEPAM 0.5 MG PO TABS
0.50 | ORAL_TABLET | ORAL | Status: DC
Start: 2018-02-24 — End: 2018-02-24

## 2018-02-24 MED ORDER — VARENICLINE TARTRATE 1 MG PO TABS
0.50 | ORAL_TABLET | ORAL | Status: DC
Start: 2018-02-24 — End: 2018-02-24

## 2018-02-24 MED ORDER — NORTRIPTYLINE HCL 25 MG PO CAPS
25.00 | ORAL_CAPSULE | ORAL | Status: DC
Start: 2018-02-24 — End: 2018-02-24

## 2018-02-24 MED ORDER — PHENOL 1.4 % MT LIQD
2.00 | OROMUCOSAL | Status: DC
Start: ? — End: 2018-02-24

## 2018-02-24 MED ORDER — INTRALIPID 20 % IV EMUL
21.00 | INTRAVENOUS | Status: DC
Start: ? — End: 2018-02-24

## 2018-02-26 DIAGNOSIS — K224 Dyskinesia of esophagus: Secondary | ICD-10-CM | POA: Diagnosis not present

## 2018-03-01 DIAGNOSIS — Z9884 Bariatric surgery status: Secondary | ICD-10-CM | POA: Diagnosis not present

## 2018-03-01 DIAGNOSIS — E441 Mild protein-calorie malnutrition: Secondary | ICD-10-CM | POA: Diagnosis not present

## 2018-03-01 DIAGNOSIS — K224 Dyskinesia of esophagus: Secondary | ICD-10-CM | POA: Diagnosis not present

## 2018-03-02 DIAGNOSIS — Z9889 Other specified postprocedural states: Secondary | ICD-10-CM | POA: Diagnosis not present

## 2018-03-02 DIAGNOSIS — Z87891 Personal history of nicotine dependence: Secondary | ICD-10-CM | POA: Diagnosis not present

## 2018-03-02 DIAGNOSIS — Z9081 Acquired absence of spleen: Secondary | ICD-10-CM | POA: Diagnosis not present

## 2018-03-02 DIAGNOSIS — K222 Esophageal obstruction: Secondary | ICD-10-CM | POA: Diagnosis not present

## 2018-03-02 DIAGNOSIS — E441 Mild protein-calorie malnutrition: Secondary | ICD-10-CM | POA: Diagnosis not present

## 2018-03-02 DIAGNOSIS — Z9884 Bariatric surgery status: Secondary | ICD-10-CM | POA: Diagnosis not present

## 2018-03-02 DIAGNOSIS — K224 Dyskinesia of esophagus: Secondary | ICD-10-CM | POA: Diagnosis not present

## 2018-03-03 ENCOUNTER — Other Ambulatory Visit: Payer: Self-pay | Admitting: Internal Medicine

## 2018-03-04 DIAGNOSIS — K224 Dyskinesia of esophagus: Secondary | ICD-10-CM | POA: Diagnosis not present

## 2018-03-04 DIAGNOSIS — Z9884 Bariatric surgery status: Secondary | ICD-10-CM | POA: Diagnosis not present

## 2018-03-04 DIAGNOSIS — E441 Mild protein-calorie malnutrition: Secondary | ICD-10-CM | POA: Diagnosis not present

## 2018-03-07 IMAGING — CT CT HEAD W/O CM
3 series · 15 of 44 positions shown, 18 images · non-contrast
Comparison: 06/27/2015

CLINICAL DATA: Loss of consciousness and fell on head. Dizziness
and headache.

EXAM:
CT HEAD WITHOUT CONTRAST
TECHNIQUE: Contiguous axial images were obtained from the base of the skull
through the vertex without intravenous contrast.

[Series 2: head wo · axial · 0.39mm/px · z∈[+143,+253]mm · 9 of 27 slices shown, 12 images]
[im 3/27  brain]
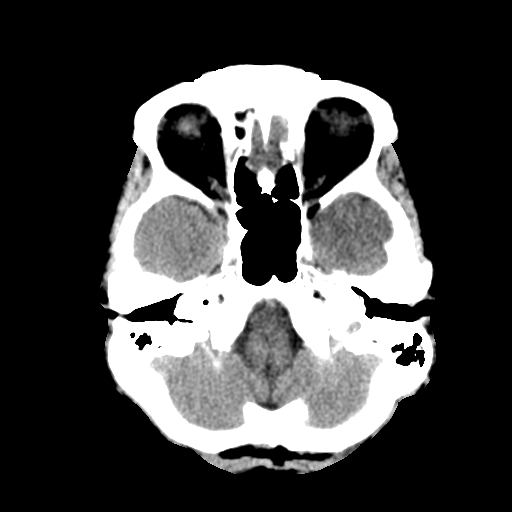
[im 3/27  bone]
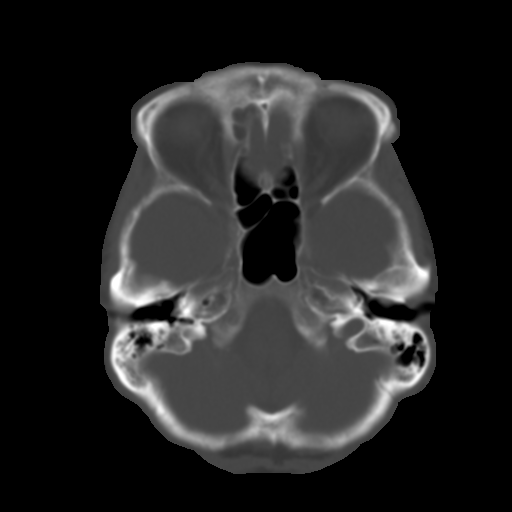
[im 6/27  brain]
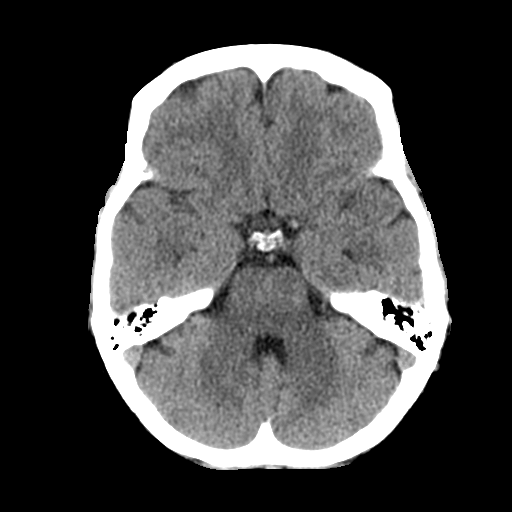
[im 8/27  brain]
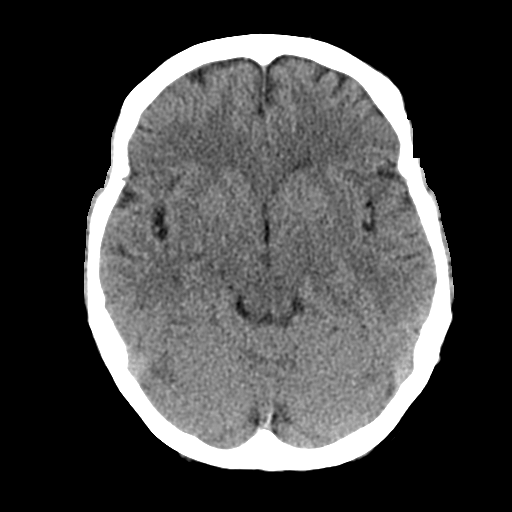
[im 11/27  brain]
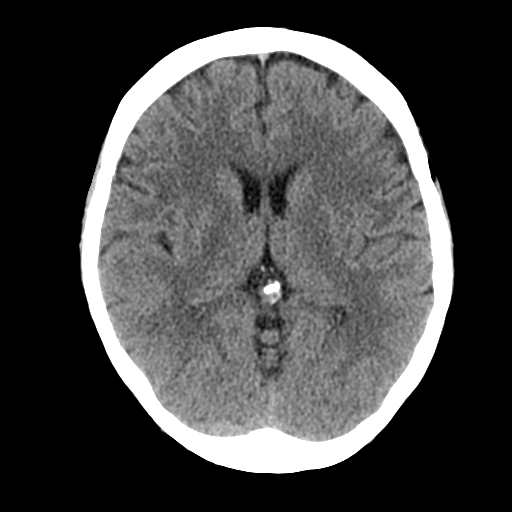
[im 14/27  brain]
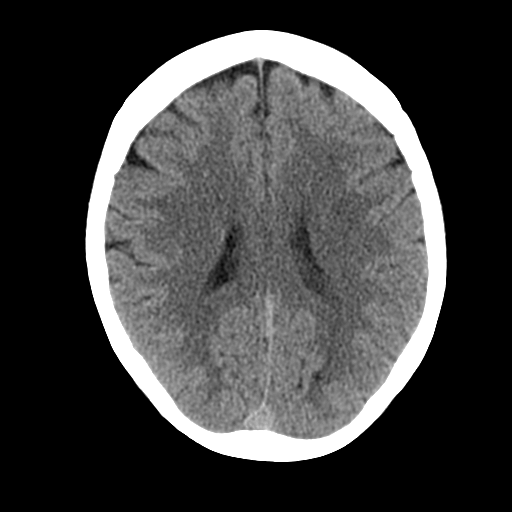
[im 14/27  bone]
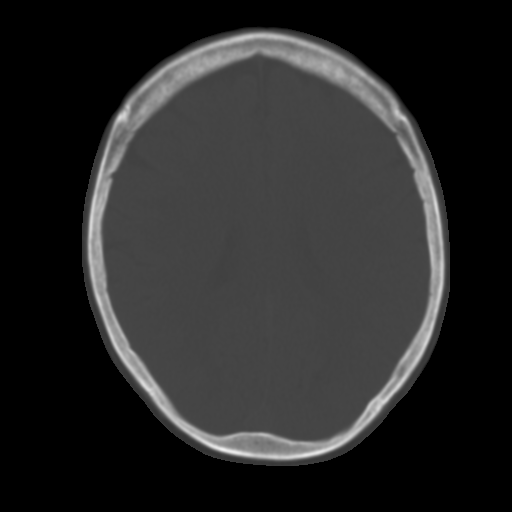
[im 17/27  brain]
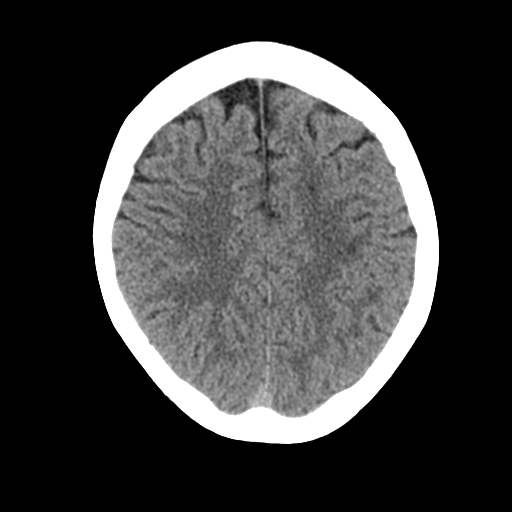
[im 20/27  brain]
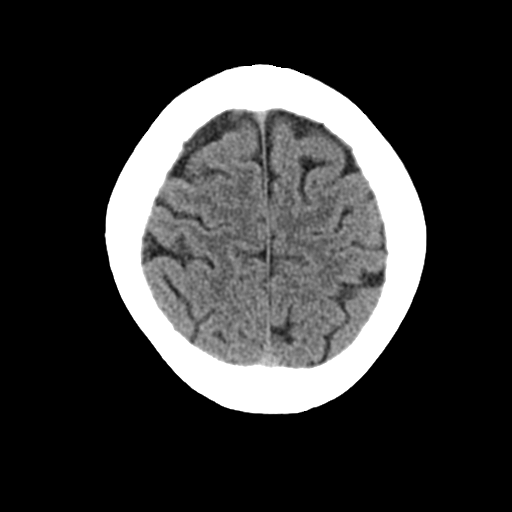
[im 22/27  brain]
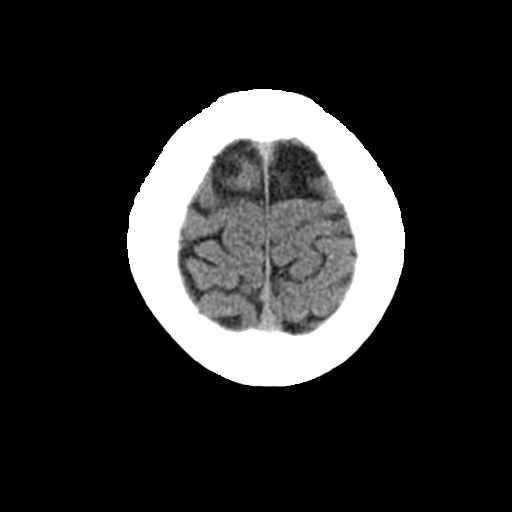
[im 25/27  brain]
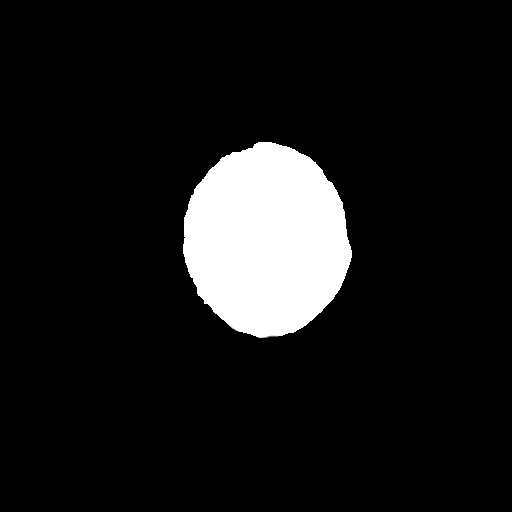
[im 25/27  bone]
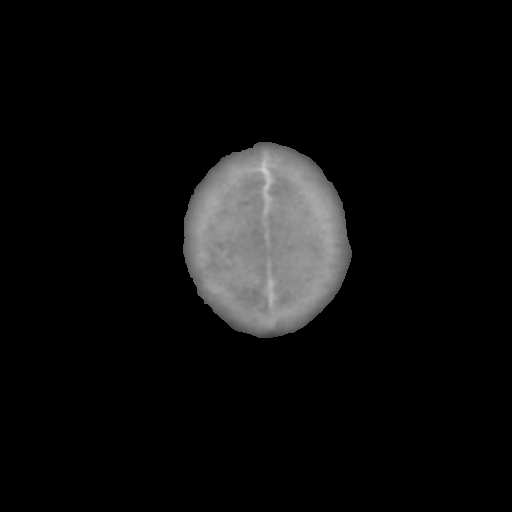

[Series 4: coronal soft tissue · coronal · 0.27mm/px · 3 of 61 slices shown]
[im 21/61  brain]
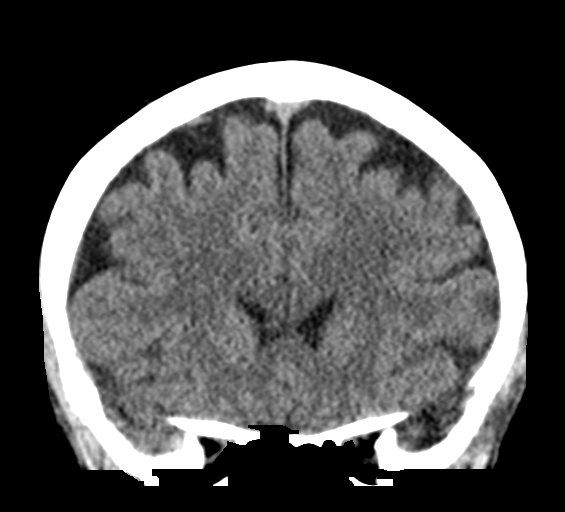
[im 27/61  brain]
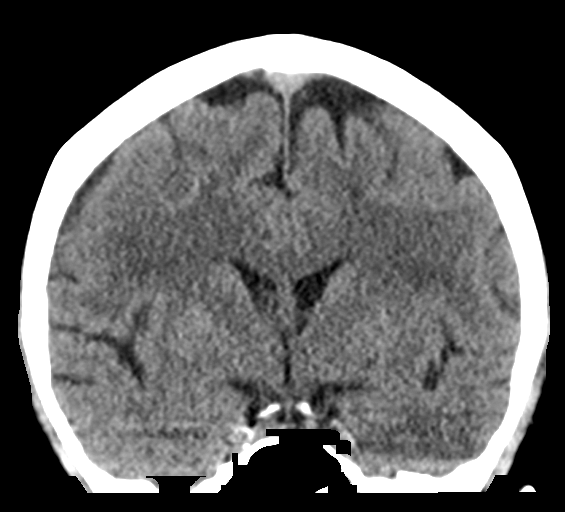
[im 34/61  brain]
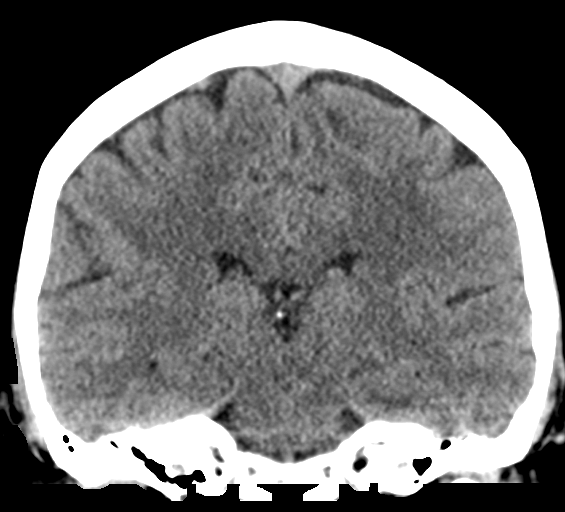

[Series 5: sagittal soft tissue · sagittal · 0.27mm/px · 3 of 52 slices shown]
[im 18/52  brain]
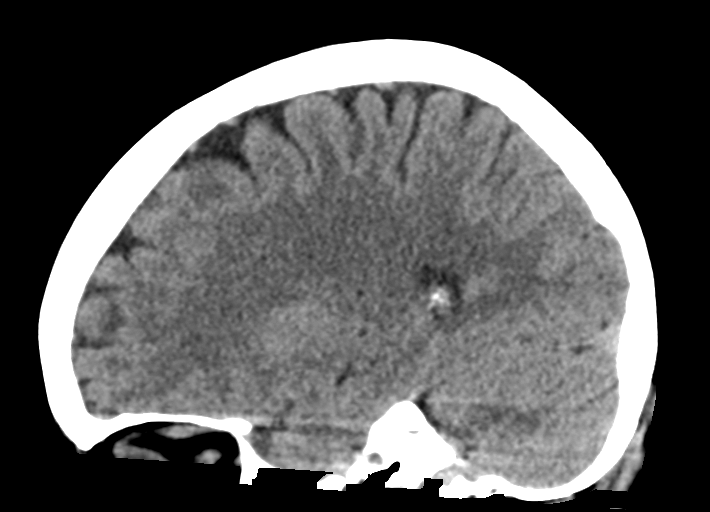
[im 26/52  brain]
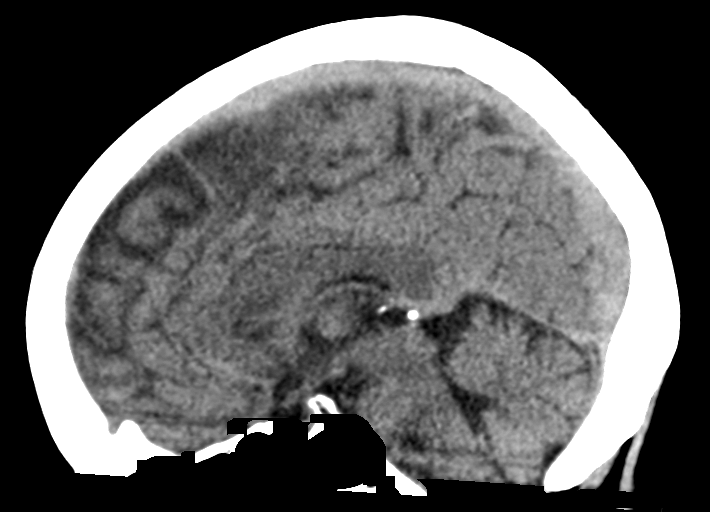
[im 35/52  brain]
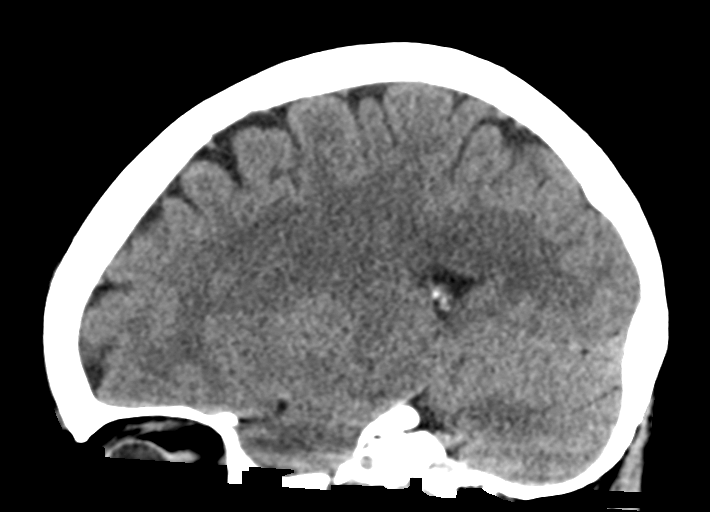

[15 of 44 positions shown; findings below may reference images not displayed]

FINDINGS: Brain: No evidence for acute hemorrhage, mass lesion, midline shift,
hydrocephalus or large infarct.

Vascular: No hyperdense vessel or unexpected calcification.

Skull: Normal. Negative for fracture or focal lesion.

Sinuses/Orbits: No acute finding.

Other: None.
IMPRESSION: No acute intracranial abnormality.

## 2018-03-09 DIAGNOSIS — K224 Dyskinesia of esophagus: Secondary | ICD-10-CM | POA: Diagnosis not present

## 2018-03-09 DIAGNOSIS — Z9884 Bariatric surgery status: Secondary | ICD-10-CM | POA: Diagnosis not present

## 2018-03-09 DIAGNOSIS — E441 Mild protein-calorie malnutrition: Secondary | ICD-10-CM | POA: Diagnosis not present

## 2018-03-11 DIAGNOSIS — K224 Dyskinesia of esophagus: Secondary | ICD-10-CM | POA: Diagnosis not present

## 2018-03-16 DIAGNOSIS — Z9884 Bariatric surgery status: Secondary | ICD-10-CM | POA: Diagnosis not present

## 2018-03-16 DIAGNOSIS — K224 Dyskinesia of esophagus: Secondary | ICD-10-CM | POA: Diagnosis not present

## 2018-03-16 DIAGNOSIS — E441 Mild protein-calorie malnutrition: Secondary | ICD-10-CM | POA: Diagnosis not present

## 2018-03-16 DIAGNOSIS — K219 Gastro-esophageal reflux disease without esophagitis: Secondary | ICD-10-CM | POA: Diagnosis not present

## 2018-03-16 DIAGNOSIS — Z9081 Acquired absence of spleen: Secondary | ICD-10-CM | POA: Diagnosis not present

## 2018-03-16 DIAGNOSIS — Z9889 Other specified postprocedural states: Secondary | ICD-10-CM | POA: Diagnosis not present

## 2018-03-16 DIAGNOSIS — K222 Esophageal obstruction: Secondary | ICD-10-CM | POA: Diagnosis not present

## 2018-03-18 DIAGNOSIS — E441 Mild protein-calorie malnutrition: Secondary | ICD-10-CM | POA: Diagnosis not present

## 2018-03-18 DIAGNOSIS — Z9884 Bariatric surgery status: Secondary | ICD-10-CM | POA: Diagnosis not present

## 2018-03-18 DIAGNOSIS — K224 Dyskinesia of esophagus: Secondary | ICD-10-CM | POA: Diagnosis not present

## 2018-03-23 ENCOUNTER — Telehealth: Payer: Self-pay

## 2018-03-23 DIAGNOSIS — E441 Mild protein-calorie malnutrition: Secondary | ICD-10-CM | POA: Diagnosis not present

## 2018-03-23 DIAGNOSIS — K449 Diaphragmatic hernia without obstruction or gangrene: Secondary | ICD-10-CM

## 2018-03-23 DIAGNOSIS — K224 Dyskinesia of esophagus: Secondary | ICD-10-CM | POA: Diagnosis not present

## 2018-03-23 DIAGNOSIS — Z9884 Bariatric surgery status: Secondary | ICD-10-CM | POA: Diagnosis not present

## 2018-03-23 NOTE — Addendum Note (Signed)
Addended by: Crecencio Mc on: 03/23/2018 06:03 PM   Modules accepted: Orders

## 2018-03-23 NOTE — Telephone Encounter (Signed)
Copied from [place] [id number]. Topic: Referral - Request >> Mar 23, 2018  8:51 AM Synthia Innocent wrote: Reason for CRM: [facility], Dr Rivka Barbara, requesting insurance referral for [facility], for hernia. Fax [phone]

## 2018-03-23 NOTE — Telephone Encounter (Signed)
Please advise 

## 2018-03-23 NOTE — Telephone Encounter (Signed)
Referral in epic

## 2018-03-23 NOTE — Telephone Encounter (Signed)
[facility] needs a referral placed for hernia surgery for insurance reasons.

## 2018-03-25 DIAGNOSIS — E441 Mild protein-calorie malnutrition: Secondary | ICD-10-CM | POA: Diagnosis not present

## 2018-03-25 DIAGNOSIS — Z9884 Bariatric surgery status: Secondary | ICD-10-CM | POA: Diagnosis not present

## 2018-03-25 DIAGNOSIS — K224 Dyskinesia of esophagus: Secondary | ICD-10-CM | POA: Diagnosis not present

## 2018-03-30 DIAGNOSIS — Z9884 Bariatric surgery status: Secondary | ICD-10-CM | POA: Diagnosis not present

## 2018-03-30 DIAGNOSIS — K224 Dyskinesia of esophagus: Secondary | ICD-10-CM | POA: Diagnosis not present

## 2018-03-30 DIAGNOSIS — E441 Mild protein-calorie malnutrition: Secondary | ICD-10-CM | POA: Diagnosis not present

## 2018-04-01 DIAGNOSIS — E441 Mild protein-calorie malnutrition: Secondary | ICD-10-CM | POA: Diagnosis not present

## 2018-04-01 DIAGNOSIS — Z882 Allergy status to sulfonamides status: Secondary | ICD-10-CM | POA: Diagnosis not present

## 2018-04-01 DIAGNOSIS — K224 Dyskinesia of esophagus: Secondary | ICD-10-CM | POA: Diagnosis not present

## 2018-04-01 DIAGNOSIS — Z9884 Bariatric surgery status: Secondary | ICD-10-CM | POA: Diagnosis not present

## 2018-04-01 DIAGNOSIS — K222 Esophageal obstruction: Secondary | ICD-10-CM | POA: Diagnosis not present

## 2018-04-02 ENCOUNTER — Encounter: Payer: Self-pay | Admitting: Internal Medicine

## 2018-04-06 ENCOUNTER — Other Ambulatory Visit (HOSPITAL_COMMUNITY)
Admission: RE | Admit: 2018-04-06 | Discharge: 2018-04-06 | Disposition: A | Discharge status: Home / Self Care | Payer: Medicare Other | Source: Other Acute Inpatient Hospital | Attending: Surgery | Admitting: Surgery

## 2018-04-06 DIAGNOSIS — K224 Dyskinesia of esophagus: Secondary | ICD-10-CM | POA: Diagnosis not present

## 2018-04-06 DIAGNOSIS — Z9884 Bariatric surgery status: Secondary | ICD-10-CM | POA: Diagnosis not present

## 2018-04-06 DIAGNOSIS — E441 Mild protein-calorie malnutrition: Secondary | ICD-10-CM | POA: Diagnosis not present

## 2018-04-06 LAB — CBC WITH DIFFERENTIAL/PLATELET
BASOS PCT: 1 %
Basophils Absolute: 0.1 10*3/uL (ref 0.0–0.1)
EOS ABS: 0.2 10*3/uL (ref 0.0–0.7)
Eosinophils Relative: 2 %
HCT: 40.3 % (ref 36.0–46.0)
Hemoglobin: 12.9 g/dL (ref 12.0–15.0)
LYMPHS PCT: 36 %
Lymphs Abs: 3.7 10*3/uL (ref 0.7–4.0)
MCH: 34.7 pg — AB (ref 26.0–34.0)
MCHC: 32 g/dL (ref 30.0–36.0)
MCV: 108.3 fL — ABNORMAL HIGH (ref 78.0–100.0)
MONO ABS: 0.8 10*3/uL (ref 0.1–1.0)
Monocytes Relative: 8 %
NEUTROS ABS: 5.5 10*3/uL (ref 1.7–7.7)
Neutrophils Relative %: 53 %
PLATELETS: 259 10*3/uL (ref 150–400)
RBC: 3.72 MIL/uL — ABNORMAL LOW (ref 3.87–5.11)
RDW: 14.2 % (ref 11.5–15.5)
WBC: 10.3 10*3/uL (ref 4.0–10.5)

## 2018-04-06 LAB — COMPREHENSIVE METABOLIC PANEL
ALBUMIN: 3.9 g/dL (ref 3.5–5.0)
ALK PHOS: 37 U/L — AB (ref 38–126)
ALT: 26 U/L (ref 14–54)
ANION GAP: 15 (ref 5–15)
AST: 31 U/L (ref 15–41)
BUN: 21 mg/dL — ABNORMAL HIGH (ref 6–20)
CALCIUM: 8.8 mg/dL — AB (ref 8.9–10.3)
CO2: 22 mmol/L (ref 22–32)
Chloride: 102 mmol/L (ref 101–111)
Creatinine, Ser: 0.73 mg/dL (ref 0.44–1.00)
GFR calc non Af Amer: >60 mL/min (ref >60)
GLUCOSE: 82 mg/dL (ref 65–99)
Potassium: 3.9 mmol/L (ref 3.5–5.1)
SODIUM: 139 mmol/L (ref 135–145)
Total Bilirubin: 0.4 mg/dL (ref 0.3–1.2)
Total Protein: 6.3 g/dL — ABNORMAL LOW (ref 6.5–8.1)

## 2018-04-06 LAB — MAGNESIUM: MAGNESIUM: 1.7 mg/dL (ref 1.7–2.4)

## 2018-04-06 LAB — TRIGLYCERIDES: Triglycerides: 58 mg/dL (ref <150)

## 2018-04-06 LAB — PHOSPHORUS: PHOSPHORUS: 3.9 mg/dL (ref 2.5–4.6)

## 2018-04-08 DIAGNOSIS — K224 Dyskinesia of esophagus: Secondary | ICD-10-CM | POA: Diagnosis not present

## 2018-04-08 DIAGNOSIS — E441 Mild protein-calorie malnutrition: Secondary | ICD-10-CM | POA: Diagnosis not present

## 2018-04-08 DIAGNOSIS — Z9884 Bariatric surgery status: Secondary | ICD-10-CM | POA: Diagnosis not present

## 2018-04-12 DIAGNOSIS — E441 Mild protein-calorie malnutrition: Secondary | ICD-10-CM | POA: Diagnosis not present

## 2018-04-12 DIAGNOSIS — Z9884 Bariatric surgery status: Secondary | ICD-10-CM | POA: Diagnosis not present

## 2018-04-12 DIAGNOSIS — K224 Dyskinesia of esophagus: Secondary | ICD-10-CM | POA: Diagnosis not present

## 2018-04-13 DIAGNOSIS — E441 Mild protein-calorie malnutrition: Secondary | ICD-10-CM | POA: Diagnosis not present

## 2018-04-13 DIAGNOSIS — Z9884 Bariatric surgery status: Secondary | ICD-10-CM | POA: Diagnosis not present

## 2018-04-13 DIAGNOSIS — K224 Dyskinesia of esophagus: Secondary | ICD-10-CM | POA: Diagnosis not present

## 2018-04-15 DIAGNOSIS — Z9884 Bariatric surgery status: Secondary | ICD-10-CM | POA: Diagnosis not present

## 2018-04-15 DIAGNOSIS — E441 Mild protein-calorie malnutrition: Secondary | ICD-10-CM | POA: Diagnosis not present

## 2018-04-15 DIAGNOSIS — K224 Dyskinesia of esophagus: Secondary | ICD-10-CM | POA: Diagnosis not present

## 2018-04-20 DIAGNOSIS — E441 Mild protein-calorie malnutrition: Secondary | ICD-10-CM | POA: Diagnosis not present

## 2018-04-20 DIAGNOSIS — K224 Dyskinesia of esophagus: Secondary | ICD-10-CM | POA: Diagnosis not present

## 2018-04-20 DIAGNOSIS — Z9884 Bariatric surgery status: Secondary | ICD-10-CM | POA: Diagnosis not present

## 2018-04-22 DIAGNOSIS — E441 Mild protein-calorie malnutrition: Secondary | ICD-10-CM | POA: Diagnosis not present

## 2018-04-22 DIAGNOSIS — K224 Dyskinesia of esophagus: Secondary | ICD-10-CM | POA: Diagnosis not present

## 2018-04-22 DIAGNOSIS — Z9884 Bariatric surgery status: Secondary | ICD-10-CM | POA: Diagnosis not present

## 2018-04-23 DIAGNOSIS — Z9884 Bariatric surgery status: Secondary | ICD-10-CM | POA: Diagnosis not present

## 2018-04-23 DIAGNOSIS — E441 Mild protein-calorie malnutrition: Secondary | ICD-10-CM | POA: Diagnosis not present

## 2018-04-23 DIAGNOSIS — K224 Dyskinesia of esophagus: Secondary | ICD-10-CM | POA: Diagnosis not present

## 2018-04-24 DIAGNOSIS — Z87891 Personal history of nicotine dependence: Secondary | ICD-10-CM | POA: Diagnosis not present

## 2018-04-24 DIAGNOSIS — Z9884 Bariatric surgery status: Secondary | ICD-10-CM | POA: Diagnosis not present

## 2018-04-24 DIAGNOSIS — Z5331 Laparoscopic surgical procedure converted to open procedure: Secondary | ICD-10-CM | POA: Diagnosis not present

## 2018-04-24 DIAGNOSIS — Z86718 Personal history of other venous thrombosis and embolism: Secondary | ICD-10-CM | POA: Diagnosis not present

## 2018-04-24 DIAGNOSIS — R578 Other shock: Secondary | ICD-10-CM | POA: Diagnosis not present

## 2018-04-24 DIAGNOSIS — E441 Mild protein-calorie malnutrition: Secondary | ICD-10-CM | POA: Diagnosis not present

## 2018-04-24 DIAGNOSIS — Z9049 Acquired absence of other specified parts of digestive tract: Secondary | ICD-10-CM | POA: Diagnosis not present

## 2018-04-24 DIAGNOSIS — K660 Peritoneal adhesions (postprocedural) (postinfection): Secondary | ICD-10-CM | POA: Diagnosis not present

## 2018-04-24 DIAGNOSIS — I82409 Acute embolism and thrombosis of unspecified deep veins of unspecified lower extremity: Secondary | ICD-10-CM

## 2018-04-24 DIAGNOSIS — R1310 Dysphagia, unspecified: Secondary | ICD-10-CM | POA: Diagnosis not present

## 2018-04-24 DIAGNOSIS — Z9889 Other specified postprocedural states: Secondary | ICD-10-CM | POA: Diagnosis not present

## 2018-04-24 DIAGNOSIS — K9189 Other postprocedural complications and disorders of digestive system: Secondary | ICD-10-CM | POA: Diagnosis not present

## 2018-04-24 DIAGNOSIS — Z6821 Body mass index (BMI) 21.0-21.9, adult: Secondary | ICD-10-CM | POA: Diagnosis not present

## 2018-04-24 DIAGNOSIS — K219 Gastro-esophageal reflux disease without esophagitis: Secondary | ICD-10-CM | POA: Diagnosis not present

## 2018-04-24 DIAGNOSIS — Z7901 Long term (current) use of anticoagulants: Secondary | ICD-10-CM | POA: Diagnosis not present

## 2018-04-24 DIAGNOSIS — K224 Dyskinesia of esophagus: Secondary | ICD-10-CM | POA: Diagnosis not present

## 2018-04-24 DIAGNOSIS — D62 Acute posthemorrhagic anemia: Secondary | ICD-10-CM | POA: Diagnosis not present

## 2018-04-24 DIAGNOSIS — K5090 Crohn's disease, unspecified, without complications: Secondary | ICD-10-CM | POA: Diagnosis not present

## 2018-04-24 DIAGNOSIS — K222 Esophageal obstruction: Secondary | ICD-10-CM | POA: Diagnosis not present

## 2018-04-24 DIAGNOSIS — Z9081 Acquired absence of spleen: Secondary | ICD-10-CM | POA: Diagnosis not present

## 2018-04-24 HISTORY — DX: Acute embolism and thrombosis of unspecified deep veins of unspecified lower extremity: I82.409

## 2018-04-25 DIAGNOSIS — E441 Mild protein-calorie malnutrition: Secondary | ICD-10-CM | POA: Diagnosis not present

## 2018-04-25 DIAGNOSIS — K224 Dyskinesia of esophagus: Secondary | ICD-10-CM | POA: Diagnosis not present

## 2018-04-25 DIAGNOSIS — Z9884 Bariatric surgery status: Secondary | ICD-10-CM | POA: Diagnosis not present

## 2018-04-26 DIAGNOSIS — E441 Mild protein-calorie malnutrition: Secondary | ICD-10-CM | POA: Diagnosis not present

## 2018-04-26 DIAGNOSIS — K224 Dyskinesia of esophagus: Secondary | ICD-10-CM | POA: Diagnosis not present

## 2018-04-26 DIAGNOSIS — Z9884 Bariatric surgery status: Secondary | ICD-10-CM | POA: Diagnosis not present

## 2018-04-27 DIAGNOSIS — E441 Mild protein-calorie malnutrition: Secondary | ICD-10-CM | POA: Diagnosis not present

## 2018-04-27 DIAGNOSIS — Z9884 Bariatric surgery status: Secondary | ICD-10-CM | POA: Diagnosis not present

## 2018-04-27 DIAGNOSIS — K224 Dyskinesia of esophagus: Secondary | ICD-10-CM | POA: Diagnosis not present

## 2018-04-28 DIAGNOSIS — R918 Other nonspecific abnormal finding of lung field: Secondary | ICD-10-CM | POA: Diagnosis not present

## 2018-04-28 DIAGNOSIS — K219 Gastro-esophageal reflux disease without esophagitis: Secondary | ICD-10-CM | POA: Diagnosis not present

## 2018-04-28 DIAGNOSIS — Z9889 Other specified postprocedural states: Secondary | ICD-10-CM | POA: Insufficient documentation

## 2018-04-28 DIAGNOSIS — Z9049 Acquired absence of other specified parts of digestive tract: Secondary | ICD-10-CM | POA: Diagnosis not present

## 2018-04-28 DIAGNOSIS — Z7901 Long term (current) use of anticoagulants: Secondary | ICD-10-CM | POA: Diagnosis not present

## 2018-04-28 DIAGNOSIS — Z86718 Personal history of other venous thrombosis and embolism: Secondary | ICD-10-CM | POA: Diagnosis not present

## 2018-04-28 DIAGNOSIS — K9189 Other postprocedural complications and disorders of digestive system: Secondary | ICD-10-CM | POA: Diagnosis not present

## 2018-04-28 DIAGNOSIS — K598 Other specified functional intestinal disorders: Secondary | ICD-10-CM | POA: Diagnosis not present

## 2018-04-28 DIAGNOSIS — D62 Acute posthemorrhagic anemia: Secondary | ICD-10-CM | POA: Diagnosis not present

## 2018-04-28 DIAGNOSIS — K222 Esophageal obstruction: Secondary | ICD-10-CM | POA: Diagnosis not present

## 2018-04-28 DIAGNOSIS — Z9081 Acquired absence of spleen: Secondary | ICD-10-CM | POA: Diagnosis not present

## 2018-04-28 DIAGNOSIS — K223 Perforation of esophagus: Secondary | ICD-10-CM | POA: Diagnosis not present

## 2018-04-28 DIAGNOSIS — R110 Nausea: Secondary | ICD-10-CM | POA: Diagnosis not present

## 2018-04-28 DIAGNOSIS — K224 Dyskinesia of esophagus: Secondary | ICD-10-CM | POA: Diagnosis not present

## 2018-04-28 DIAGNOSIS — J9819 Other pulmonary collapse: Secondary | ICD-10-CM | POA: Diagnosis not present

## 2018-04-28 DIAGNOSIS — K5090 Crohn's disease, unspecified, without complications: Secondary | ICD-10-CM | POA: Diagnosis not present

## 2018-04-28 DIAGNOSIS — Z5331 Laparoscopic surgical procedure converted to open procedure: Secondary | ICD-10-CM | POA: Diagnosis not present

## 2018-04-28 DIAGNOSIS — Z931 Gastrostomy status: Secondary | ICD-10-CM | POA: Diagnosis not present

## 2018-04-28 DIAGNOSIS — K660 Peritoneal adhesions (postprocedural) (postinfection): Secondary | ICD-10-CM | POA: Diagnosis not present

## 2018-04-28 DIAGNOSIS — Z9884 Bariatric surgery status: Secondary | ICD-10-CM | POA: Diagnosis not present

## 2018-04-28 DIAGNOSIS — Z87891 Personal history of nicotine dependence: Secondary | ICD-10-CM | POA: Diagnosis not present

## 2018-04-28 DIAGNOSIS — Z9841 Cataract extraction status, right eye: Secondary | ICD-10-CM | POA: Diagnosis not present

## 2018-04-28 DIAGNOSIS — Z9911 Dependence on respirator [ventilator] status: Secondary | ICD-10-CM | POA: Diagnosis not present

## 2018-04-28 DIAGNOSIS — R000 Tachycardia, unspecified: Secondary | ICD-10-CM | POA: Diagnosis not present

## 2018-04-28 DIAGNOSIS — E441 Mild protein-calorie malnutrition: Secondary | ICD-10-CM | POA: Diagnosis not present

## 2018-04-28 DIAGNOSIS — R578 Other shock: Secondary | ICD-10-CM | POA: Diagnosis not present

## 2018-04-28 DIAGNOSIS — R9431 Abnormal electrocardiogram [ECG] [EKG]: Secondary | ICD-10-CM | POA: Diagnosis not present

## 2018-04-28 DIAGNOSIS — Z9981 Dependence on supplemental oxygen: Secondary | ICD-10-CM | POA: Diagnosis not present

## 2018-04-29 DIAGNOSIS — K224 Dyskinesia of esophagus: Secondary | ICD-10-CM | POA: Diagnosis not present

## 2018-04-29 DIAGNOSIS — Z9884 Bariatric surgery status: Secondary | ICD-10-CM | POA: Diagnosis not present

## 2018-04-29 DIAGNOSIS — E441 Mild protein-calorie malnutrition: Secondary | ICD-10-CM | POA: Diagnosis not present

## 2018-05-04 IMAGING — DX DG LUMBAR SPINE COMPLETE 4+V
5 series · 5 of 5 positions shown · non-contrast
Comparison: None.

CLINICAL DATA: Right side sciatica

EXAM:
LUMBAR SPINE - COMPLETE 4+ VIEW

[lumbar spine ap]
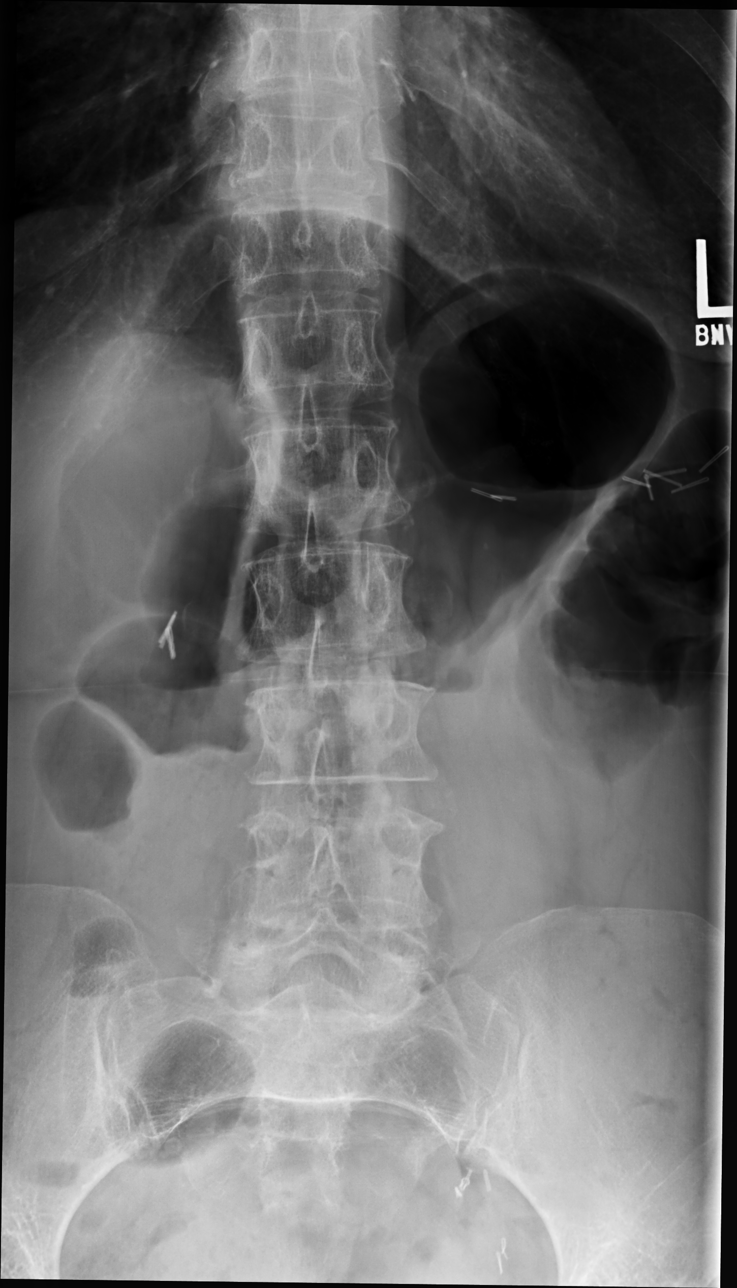

[lumbar spine obl (oblique) (1 of 2)]
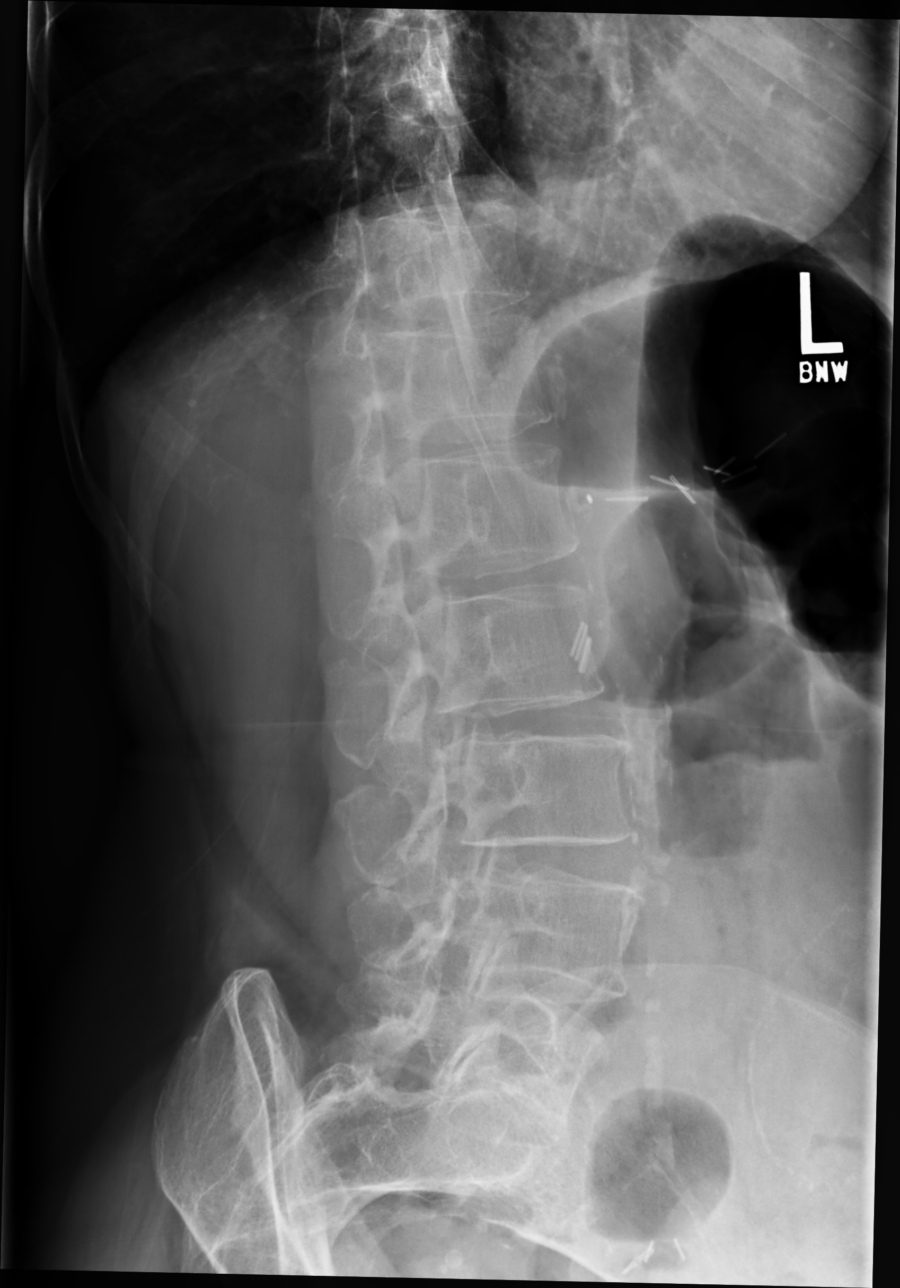

[lumbar spine obl (oblique) (2 of 2)]
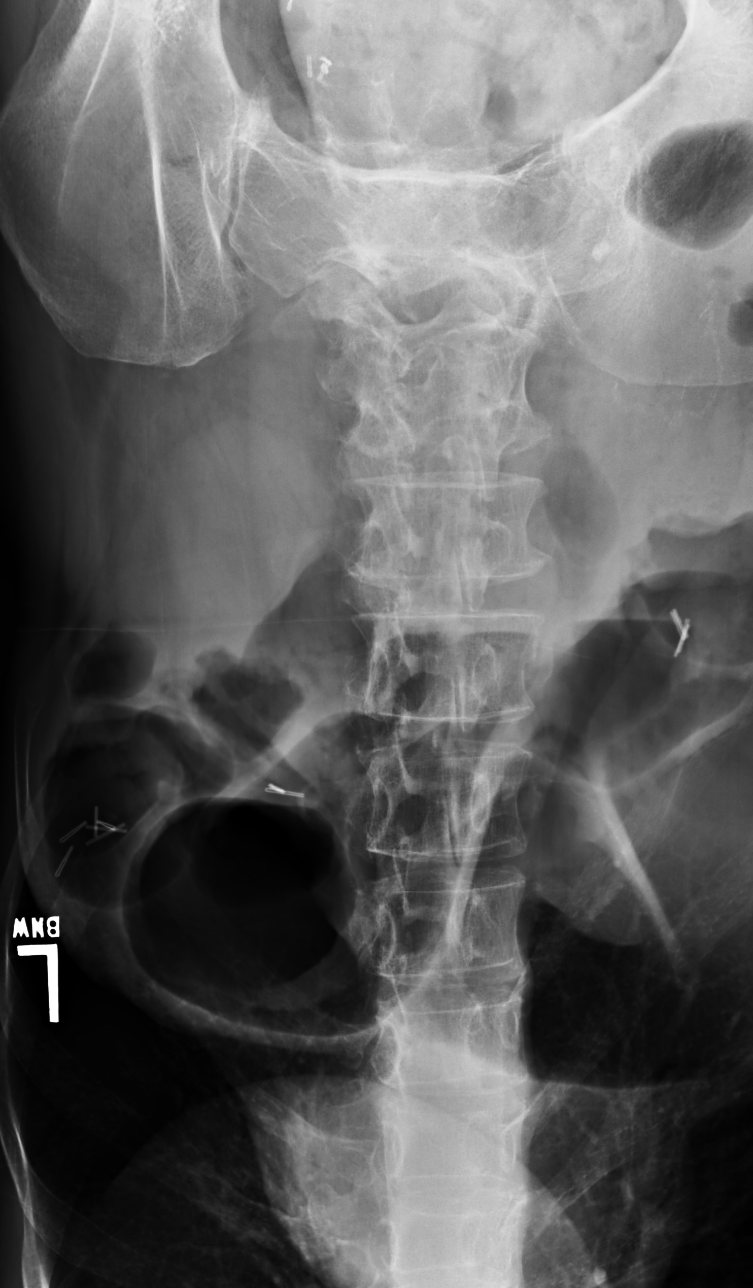

[lumbar spine lat]
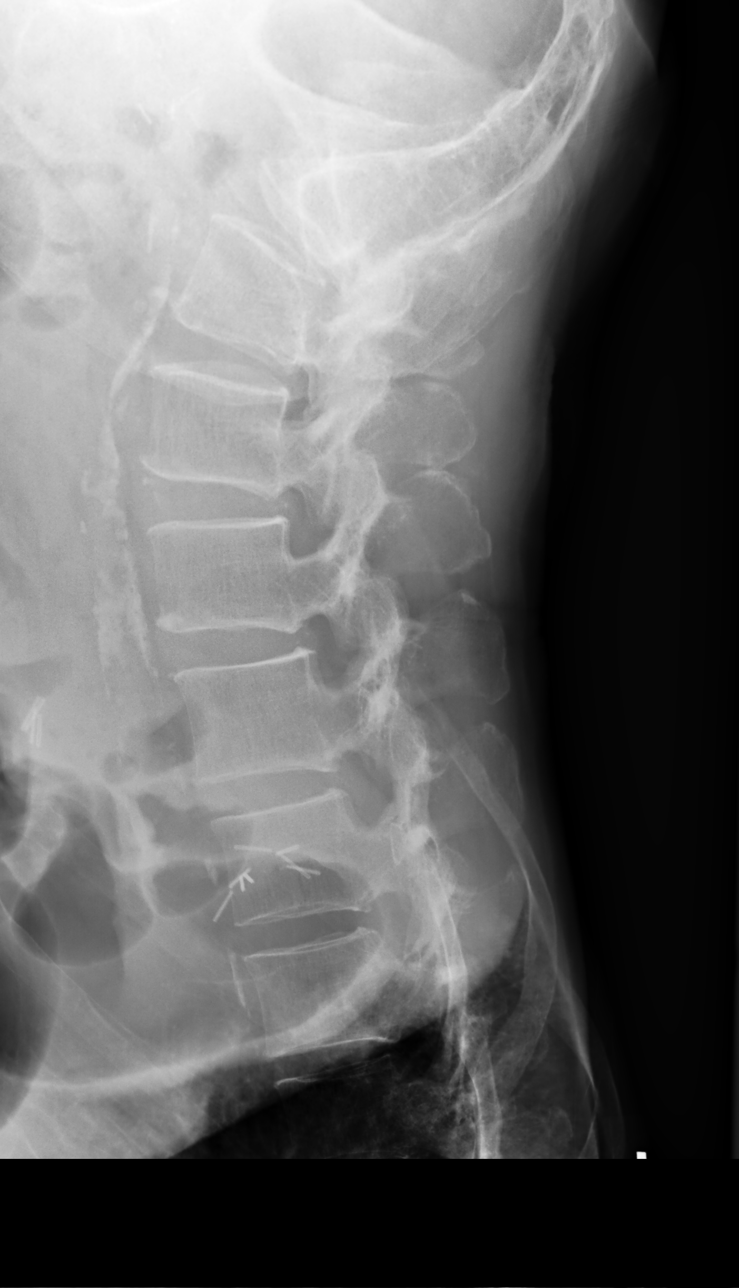

[lumbar spot lat]
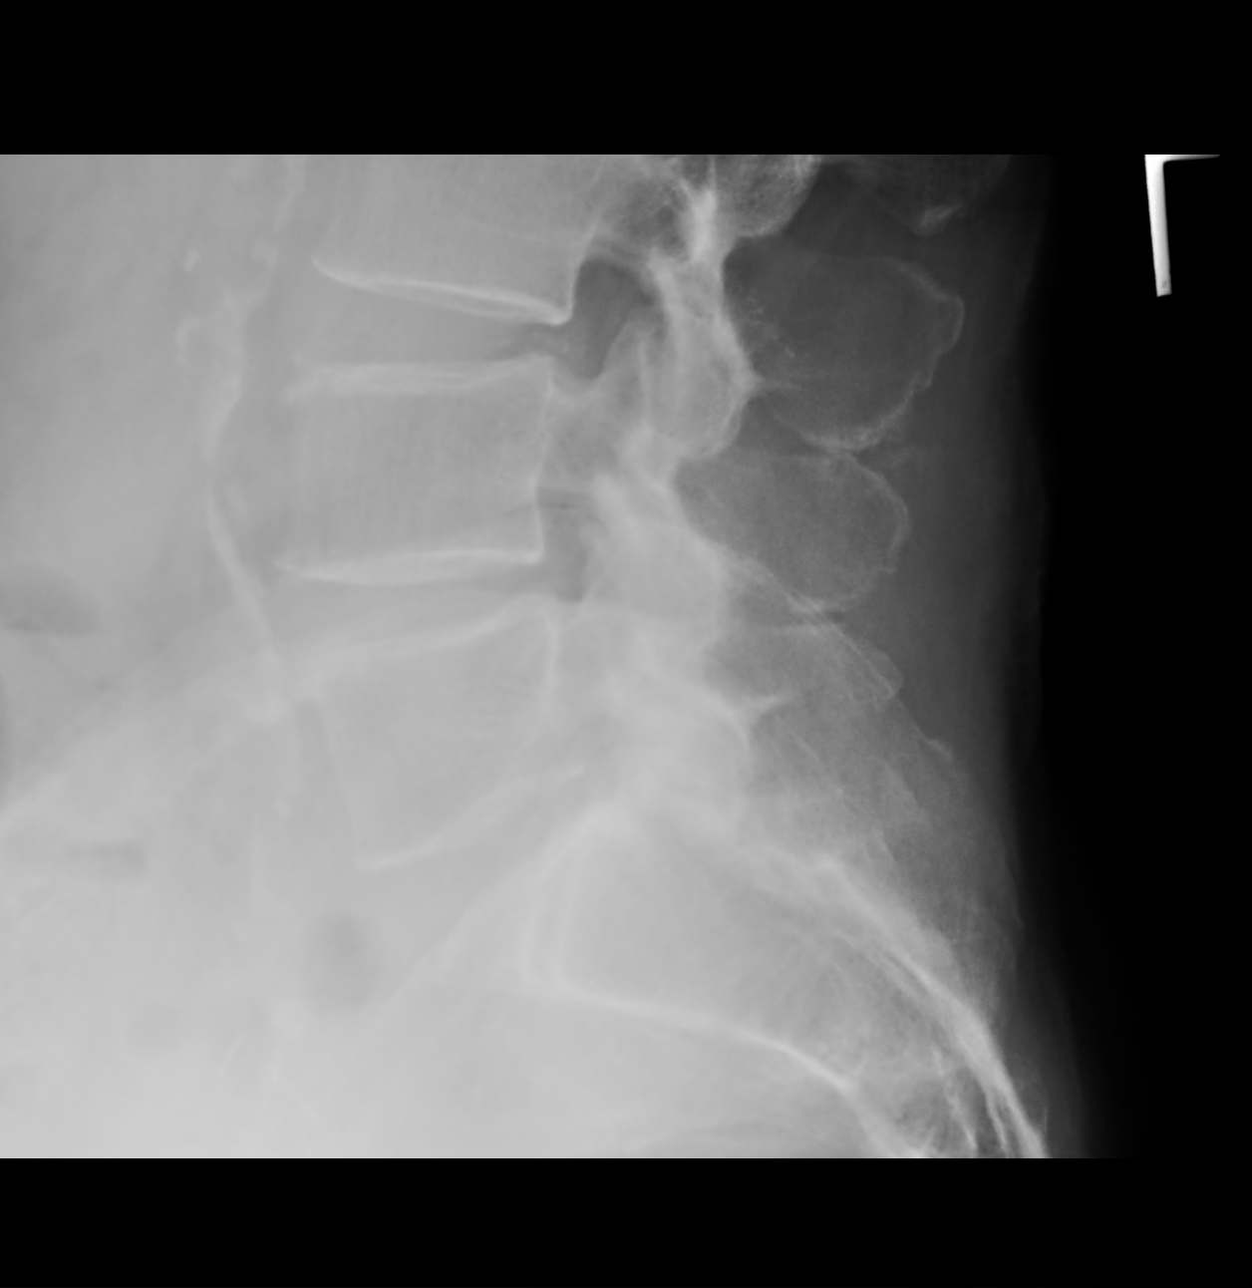

[5 of 5 positions shown; findings below may reference images not displayed]

FINDINGS: Mild degenerative facet disease at L4-5 and L5-S1. Early disc space
narrowing at L5-S1. Normal alignment. No fracture. SI joints are
symmetric and unremarkable. Aortic and iliac calcifications without
visible aneurysm.
IMPRESSION: Mild degenerative facet disease in the lower lumbar spine. Early
degenerative disc disease at L5-S1. No acute findings.

Aortoiliac atherosclerosis.

## 2018-05-04 IMAGING — DX DG HIP (WITH OR WITHOUT PELVIS) 2-3V*R*
3 series · 3 of 3 positions shown · non-contrast
Comparison: None.

CLINICAL DATA: Right-sided sciatica, no known injury, initial
encounter

EXAM:
DG HIP (WITH OR WITHOUT PELVIS) 2-3V RIGHT

[pelvis ap]
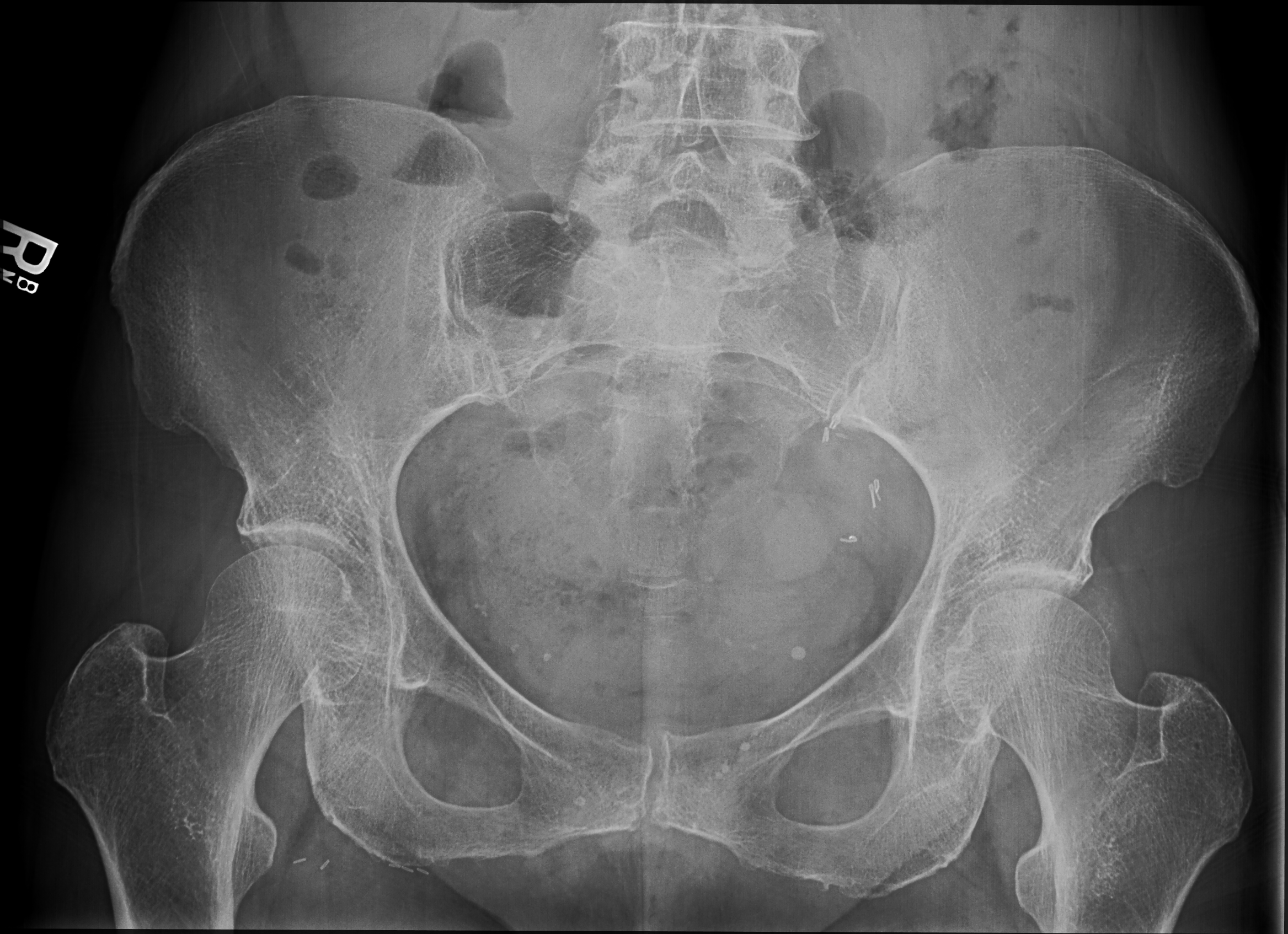

[hip joint ap]
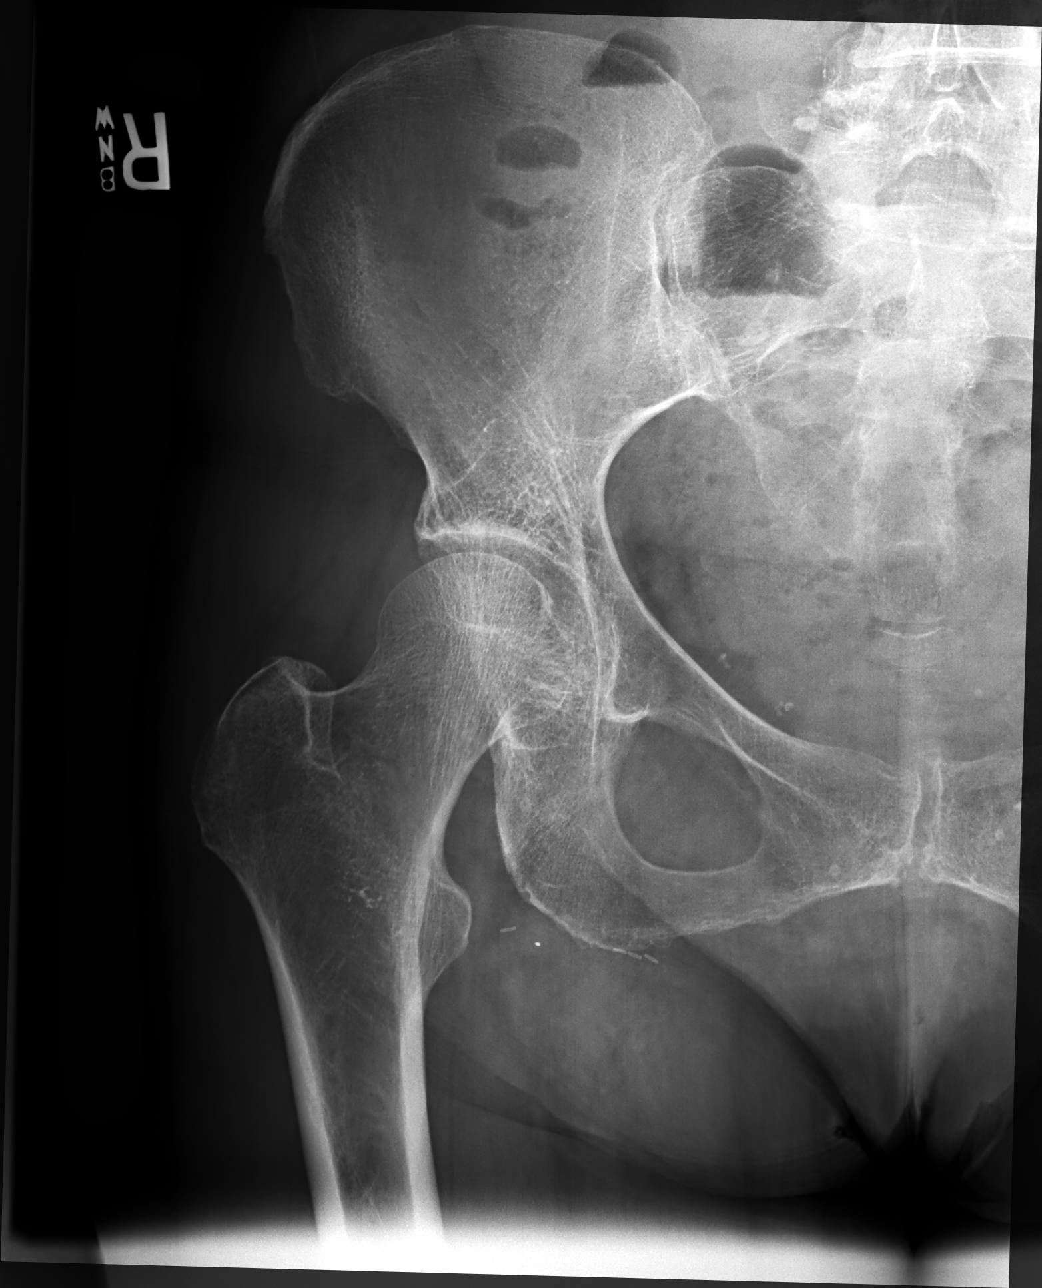

[ap frog obl (oblique)]
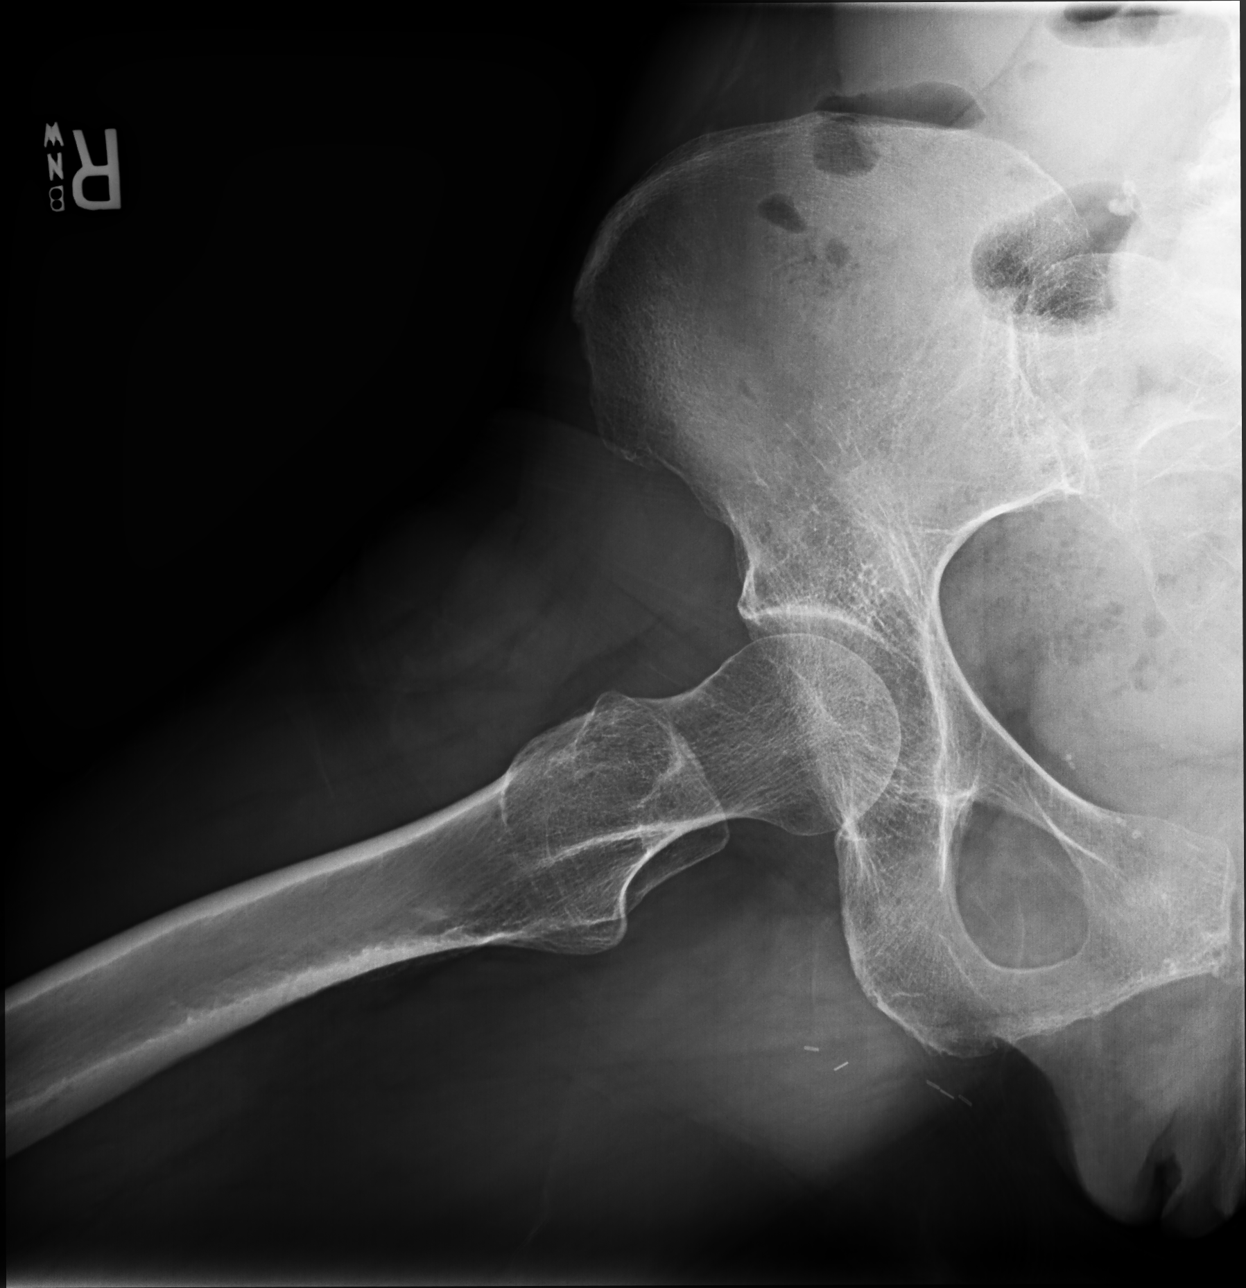

[3 of 3 positions shown; findings below may reference images not displayed]

FINDINGS: Pelvic ring is intact. No acute fracture or dislocation is noted. No
gross soft tissue abnormality is seen. Degenerative changes in the
lumbar spine are noted.
IMPRESSION: No acute abnormality seen.

## 2018-05-04 IMAGING — DX DG CHEST 2V
2 series · 2 of 2 positions shown · non-contrast
Comparison: 10/24/2015

CLINICAL DATA: Cough

EXAM:
CHEST  2 VIEW

[chest pa]
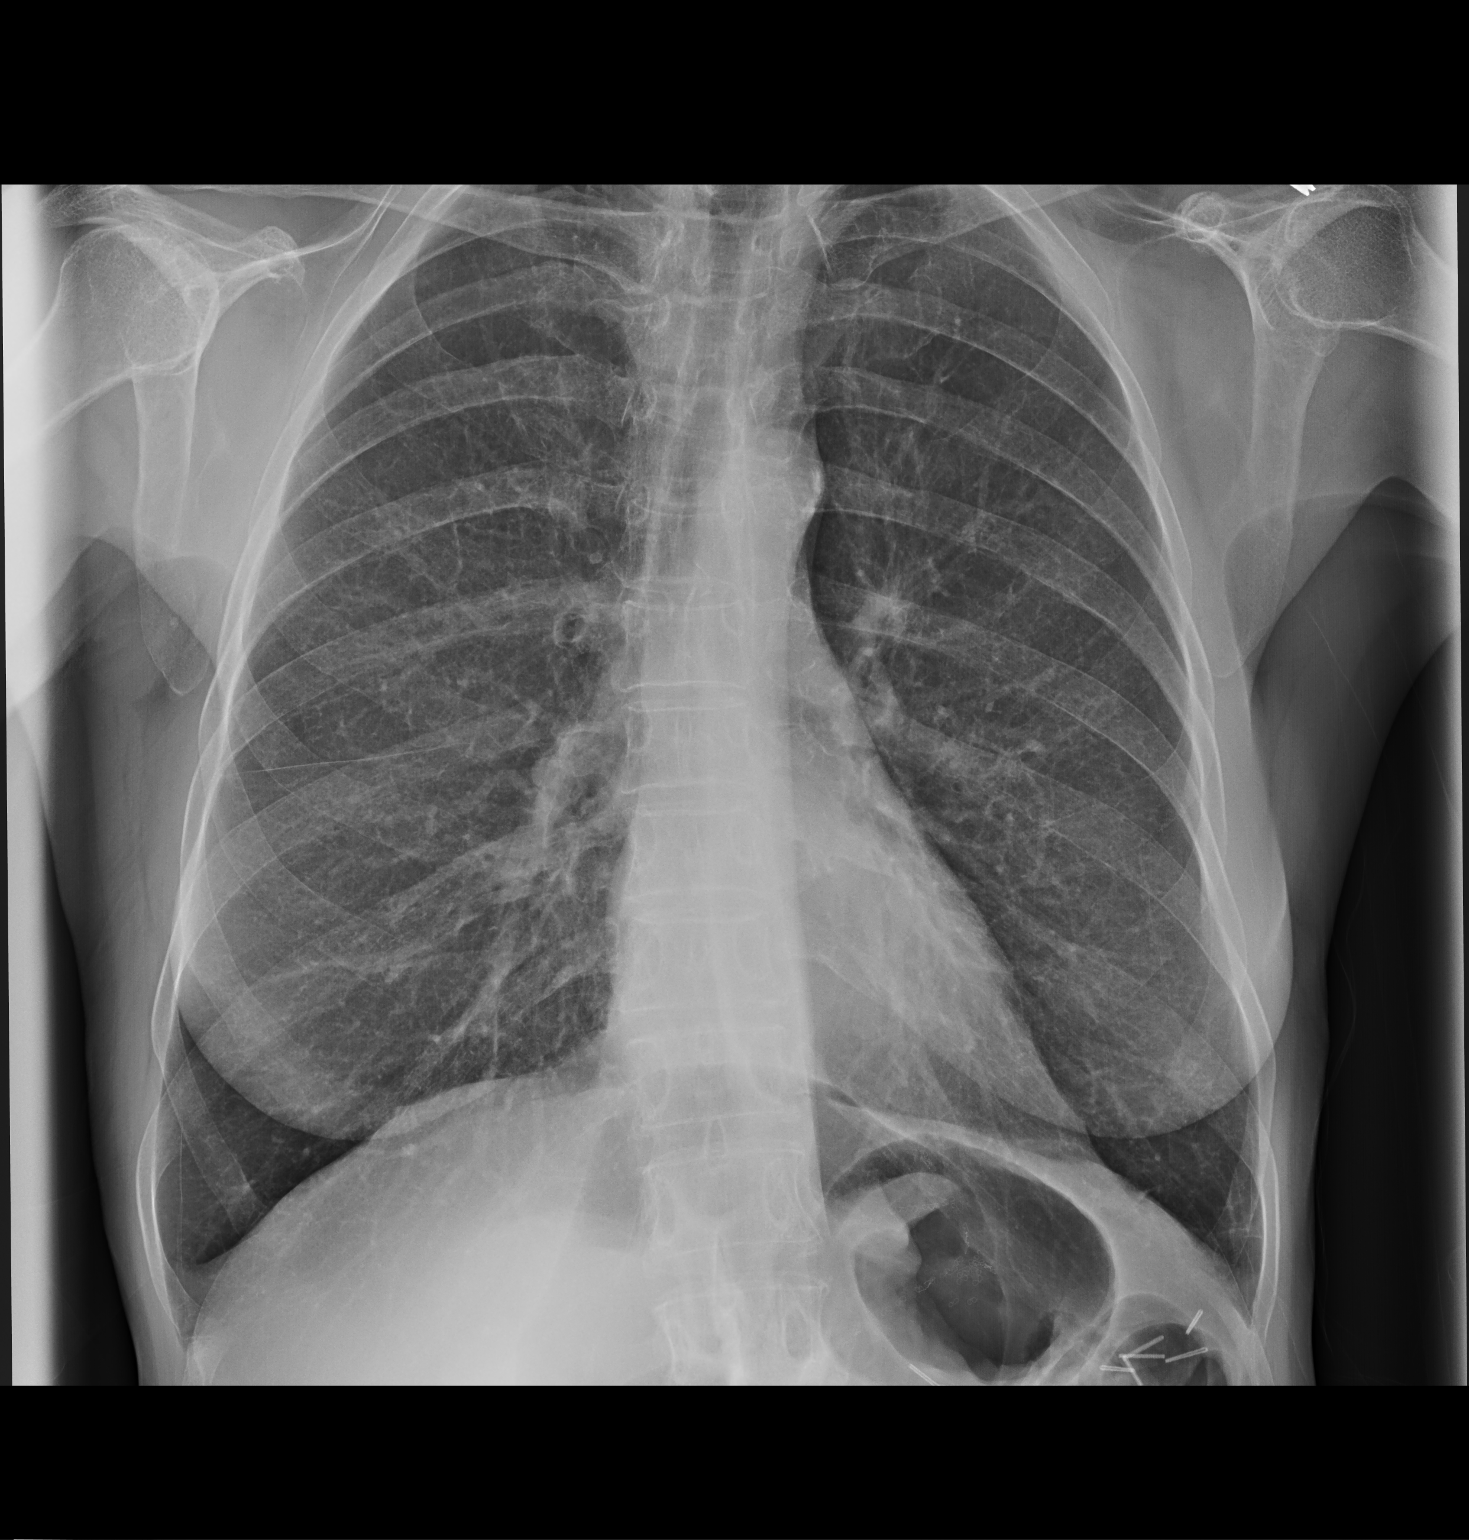

[chest lat]
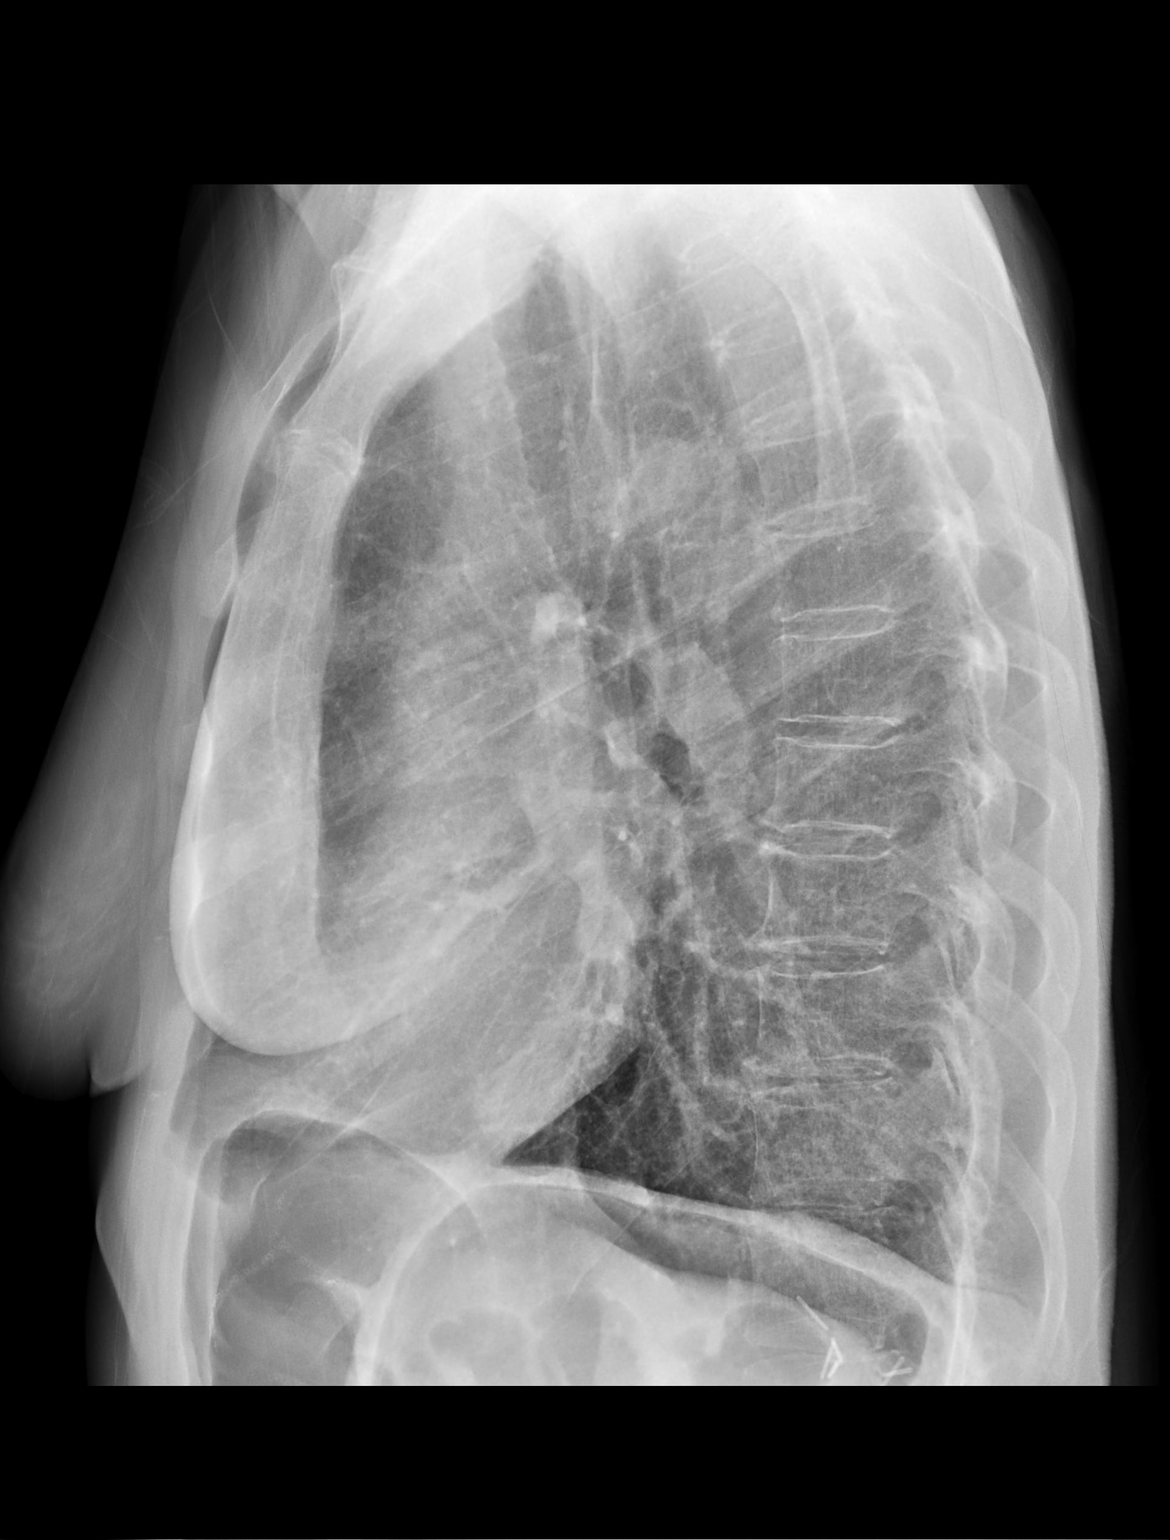

[2 of 2 positions shown; findings below may reference images not displayed]

FINDINGS: Cardiac shadow is within normal limits. The lungs are hyperinflated
bilaterally consistent with COPD. No focal infiltrate or sizable
effusion is seen. Postsurgical changes are noted in the cervical
spine. No acute bony abnormality is noted.
IMPRESSION: COPD without acute abnormality.

## 2018-05-05 DIAGNOSIS — Z9884 Bariatric surgery status: Secondary | ICD-10-CM | POA: Diagnosis not present

## 2018-05-05 DIAGNOSIS — K224 Dyskinesia of esophagus: Secondary | ICD-10-CM | POA: Diagnosis not present

## 2018-05-05 DIAGNOSIS — E441 Mild protein-calorie malnutrition: Secondary | ICD-10-CM | POA: Diagnosis not present

## 2018-05-05 MED ORDER — LACTATED RINGERS IV SOLN
INTRAVENOUS | Status: DC
Start: ? — End: 2018-05-05

## 2018-05-05 MED ORDER — SERTRALINE HCL 25 MG PO TABS
100.00 | ORAL_TABLET | ORAL | Status: DC
Start: 2018-05-05 — End: 2018-05-05

## 2018-05-05 MED ORDER — TRAMADOL HCL 50 MG PO TABS
50.00 | ORAL_TABLET | ORAL | Status: DC
Start: ? — End: 2018-05-05

## 2018-05-05 MED ORDER — ACETAMINOPHEN 325 MG PO TABS
325.00 | ORAL_TABLET | ORAL | Status: DC
Start: 2018-05-05 — End: 2018-05-05

## 2018-05-05 MED ORDER — HYDROXYZINE HCL 25 MG/ML IM SOLN
12.50 | INTRAMUSCULAR | Status: DC
Start: ? — End: 2018-05-05

## 2018-05-05 MED ORDER — NORTRIPTYLINE HCL 25 MG PO CAPS
25.00 | ORAL_CAPSULE | ORAL | Status: DC
Start: 2018-05-05 — End: 2018-05-05

## 2018-05-05 MED ORDER — HYDROCORTISONE 0.5 % EX OINT
TOPICAL_OINTMENT | CUTANEOUS | Status: DC
Start: 2018-05-05 — End: 2018-05-05

## 2018-05-05 MED ORDER — OXYCODONE-ACETAMINOPHEN 5-325 MG PO TABS
1.00 | ORAL_TABLET | ORAL | Status: DC
Start: ? — End: 2018-05-05

## 2018-05-05 MED ORDER — GENERIC EXTERNAL MEDICATION
5.00 | Status: DC
Start: ? — End: 2018-05-05

## 2018-05-05 MED ORDER — ONDANSETRON HCL 4 MG/2ML IJ SOLN
4.00 | INTRAMUSCULAR | Status: DC
Start: ? — End: 2018-05-05

## 2018-05-05 MED ORDER — GENERIC EXTERNAL MEDICATION
40.00 | Status: DC
Start: 2018-05-05 — End: 2018-05-05

## 2018-05-05 MED ORDER — TRAZODONE HCL 100 MG PO TABS
100.00 | ORAL_TABLET | ORAL | Status: DC
Start: 2018-05-05 — End: 2018-05-05

## 2018-05-05 MED ORDER — GENERIC EXTERNAL MEDICATION
Status: DC
Start: ? — End: 2018-05-05

## 2018-05-05 MED ORDER — ENOXAPARIN SODIUM 40 MG/0.4ML ~~LOC~~ SOLN
40.00 | SUBCUTANEOUS | Status: DC
Start: 2018-05-06 — End: 2018-05-05

## 2018-05-06 DIAGNOSIS — Z9884 Bariatric surgery status: Secondary | ICD-10-CM | POA: Diagnosis not present

## 2018-05-06 DIAGNOSIS — K224 Dyskinesia of esophagus: Secondary | ICD-10-CM | POA: Diagnosis not present

## 2018-05-06 DIAGNOSIS — E441 Mild protein-calorie malnutrition: Secondary | ICD-10-CM | POA: Diagnosis not present

## 2018-05-12 DIAGNOSIS — Z9884 Bariatric surgery status: Secondary | ICD-10-CM | POA: Diagnosis not present

## 2018-05-12 DIAGNOSIS — Z48815 Encounter for surgical aftercare following surgery on the digestive system: Secondary | ICD-10-CM | POA: Diagnosis not present

## 2018-05-12 DIAGNOSIS — E441 Mild protein-calorie malnutrition: Secondary | ICD-10-CM | POA: Diagnosis not present

## 2018-05-12 DIAGNOSIS — Z7689 Persons encountering health services in other specified circumstances: Secondary | ICD-10-CM | POA: Diagnosis not present

## 2018-05-12 DIAGNOSIS — K224 Dyskinesia of esophagus: Secondary | ICD-10-CM | POA: Diagnosis not present

## 2018-05-13 DIAGNOSIS — K224 Dyskinesia of esophagus: Secondary | ICD-10-CM | POA: Diagnosis not present

## 2018-05-13 DIAGNOSIS — E441 Mild protein-calorie malnutrition: Secondary | ICD-10-CM | POA: Diagnosis not present

## 2018-05-13 DIAGNOSIS — Z9884 Bariatric surgery status: Secondary | ICD-10-CM | POA: Diagnosis not present

## 2018-05-18 DIAGNOSIS — E441 Mild protein-calorie malnutrition: Secondary | ICD-10-CM | POA: Diagnosis not present

## 2018-05-18 DIAGNOSIS — Z9884 Bariatric surgery status: Secondary | ICD-10-CM | POA: Diagnosis not present

## 2018-05-18 DIAGNOSIS — K224 Dyskinesia of esophagus: Secondary | ICD-10-CM | POA: Diagnosis not present

## 2018-05-19 IMAGING — MR MR LUMBAR SPINE W/O CM
4 of 5 series · 14 of 48 positions shown · non-contrast
Comparison: Lumbar spine radiographs 10/23/2016.

CLINICAL DATA: Low back and RIGHT leg pain for 2 months.

EXAM:
MRI LUMBAR SPINE WITHOUT CONTRAST
TECHNIQUE: Multiplanar, multisequence MR imaging of the lumbar spine was
performed. No intravenous contrast was administered.

[Series 2: T2 · sagittal · 4.0mm · 0.44mm/px · 5 of 17 slices shown (1 of 2)]
[im 1/17]
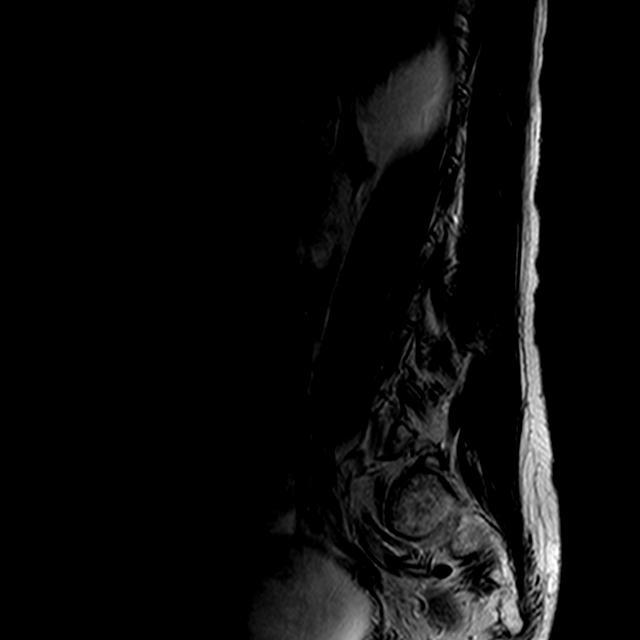
[im 3/17]
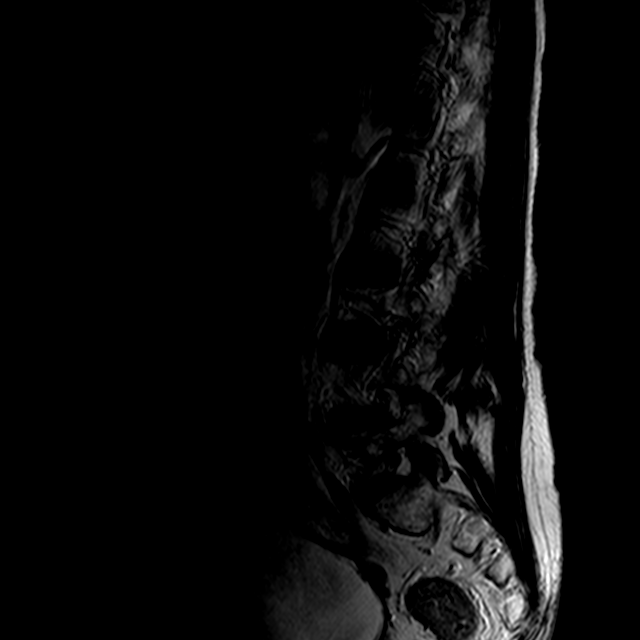
[im 5/17]
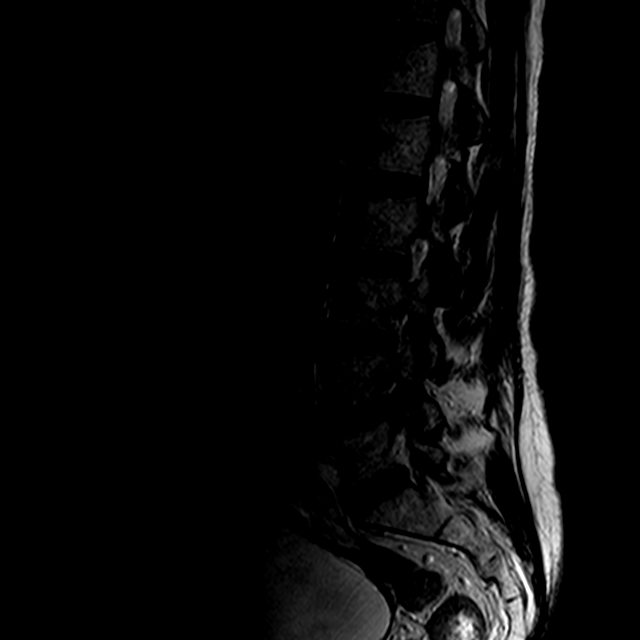
[im 10/17]
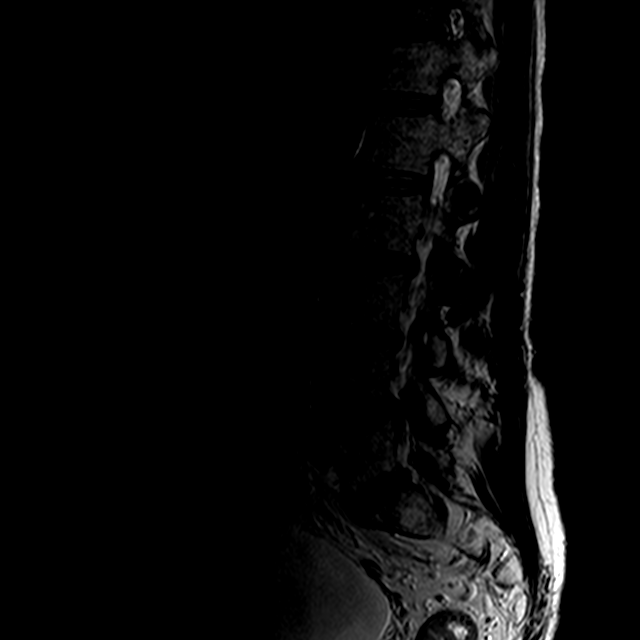
[im 14/17]
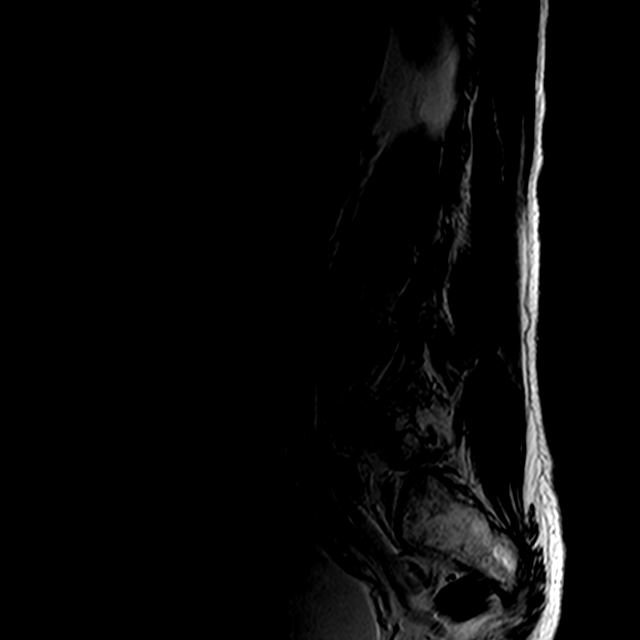

[Series 3: T1 · sagittal · 4.0mm · 0.44mm/px · 3 of 17 slices shown (1 of 2)]
[im 3/17]
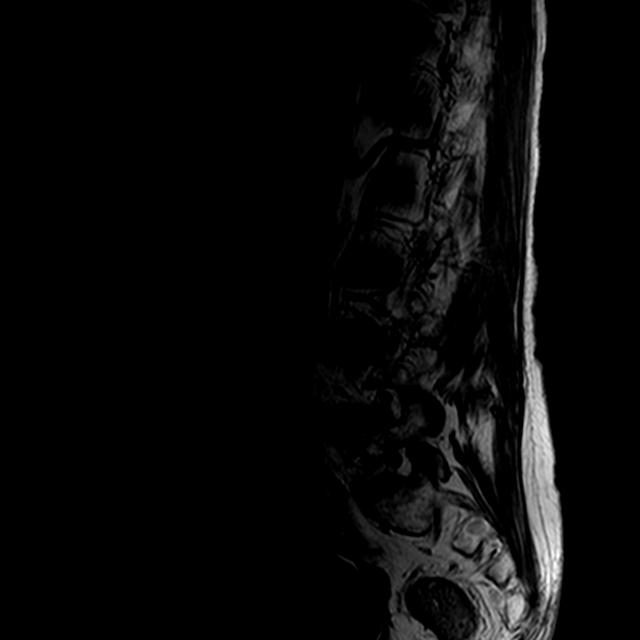
[im 9/17]
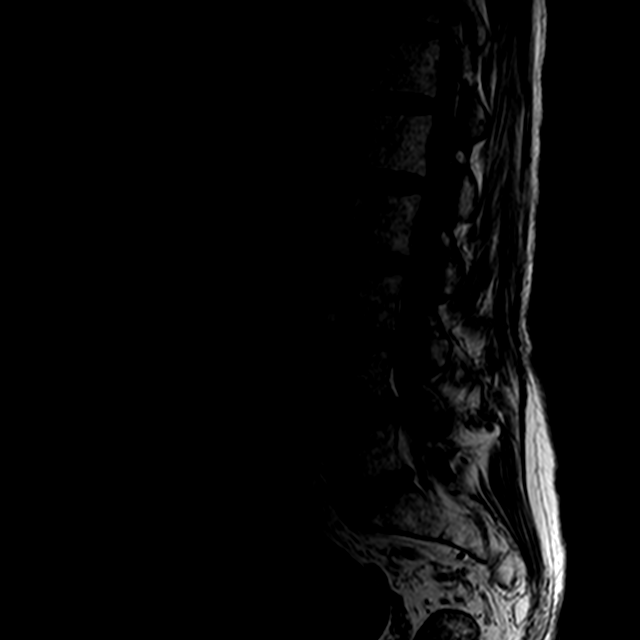
[im 14/17]
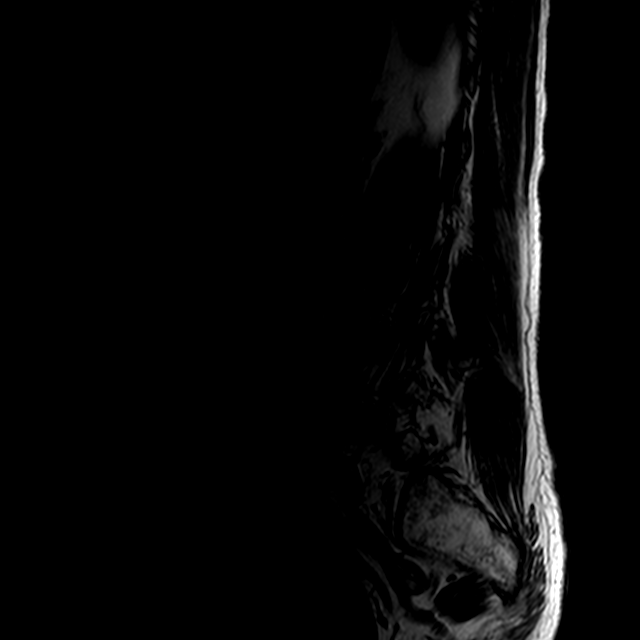

[Series 5: T2 · axial · 4.0mm · 0.39mm/px · z∈[-134,-16]mm · 3 of 32 slices shown (2 of 2)]
[im 6/32]
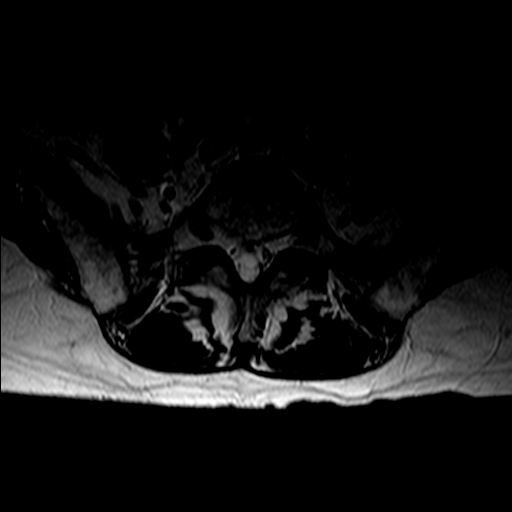
[im 16/32]
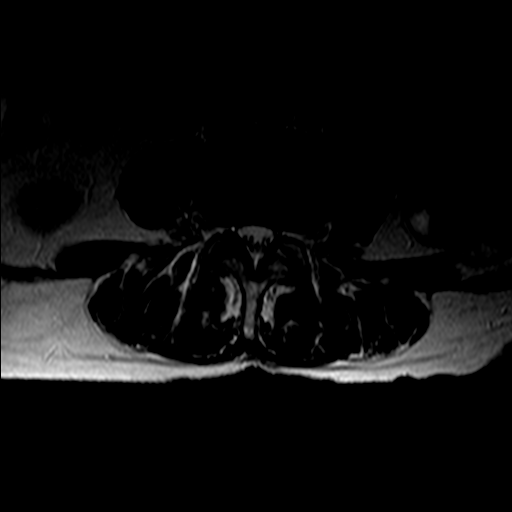
[im 26/32]
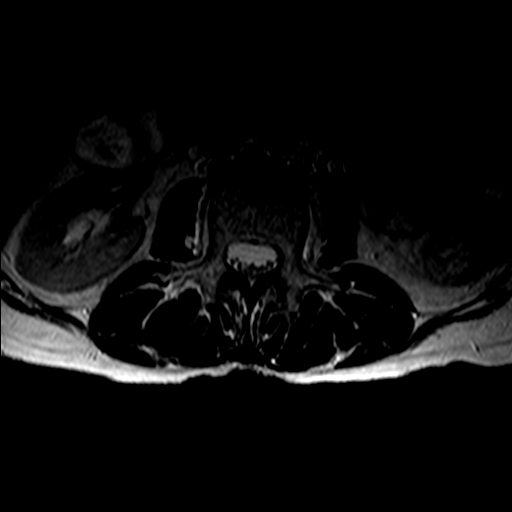

[Series 6: T1 · axial · 4.0mm · 0.39mm/px · z∈[-134,-16]mm · 3 of 32 slices shown (2 of 2)]
[im 6/32]
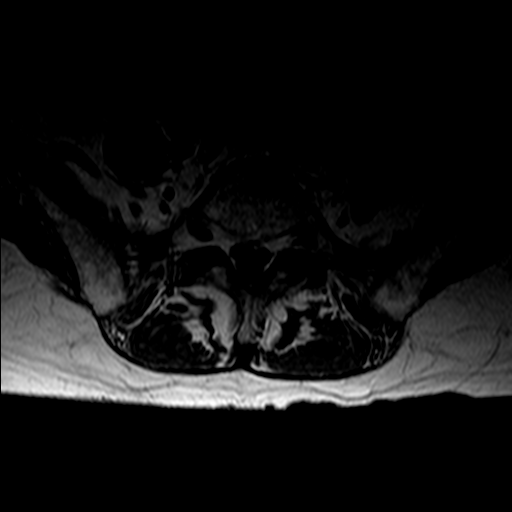
[im 16/32]
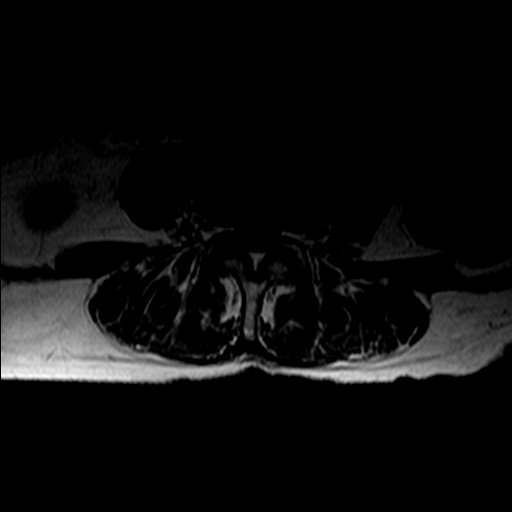
[im 26/32]
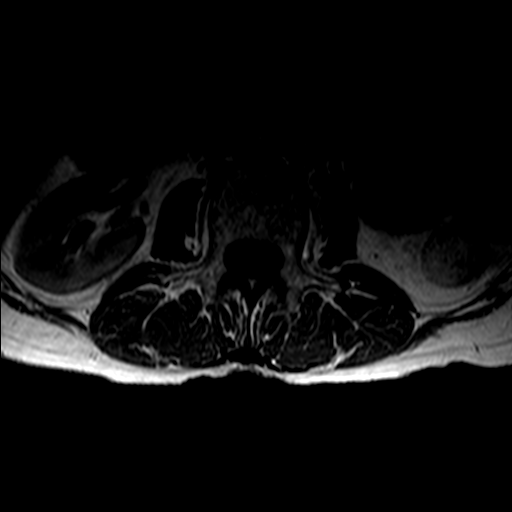

[14 of 48 positions shown; findings below may reference images not displayed]

FINDINGS: Segmentation:  Standard.  Five lumbar type vertebrae.

Alignment:  Anatomic.

Vertebrae:  No fracture, evidence of discitis, or bone lesion.

Conus medullaris: Extends to the  L1 level and appears normal.

Paraspinal and other soft tissues: The bladder is markedly
distended. Query bladder outlet obstruction. No visible
hydronephrosis.

Disc levels:

L1-L2:  Normal.

L2-L3:  Mild bulge.  Mild facet arthropathy.  No impingement.

L3-L4: Mild bulge. Mild facet arthropathy. Annular fissures extend
to the RIGHT and LEFT but there is no significant foraminal
narrowing or subarticular zone narrowing.

L4-L5: Broad-based disc protrusion. Disc material also extends into
both neural foramina, greater on the RIGHT. There is a T2
hyperintense structure in the midline, slight cephalad migration
from the interspace, which is most consistent with a discal cyst.
Discal cyst measurements are 10 x 3 x 5 mm. There is advanced facet
arthropathy and ligamentum flavum hypertrophy. Severe spinal
stenosis is present. RIGHT greater than LEFT L4 and L5 nerve root
impingement. Baastrup's disease with edema of the interspinous
bursa, but no posterior intraspinal synovial cyst.

L5-S1: Small central protrusion. Mild facet arthropathy. No
impingement.
IMPRESSION: The dominant findings are at L4-5 where soft disc protrusion
extending into both foramina, as well as a superimposed discal cyst
with slight cephalad migration, in conjunction with posterior
element hypertrophy, contributes to RIGHT greater than LEFT L4 and
L5 nerve root impingement as well as severe stenosis. Discal cysts
have a more favorable prognosis with regard to conservative
treatment than frank disc extrusions, but surgical consultation may
be warranted if severe radicular symptoms persist.

Marked bladder distention. Query bladder outlet obstruction.
Correlate clinically.

## 2018-05-20 DIAGNOSIS — Z9884 Bariatric surgery status: Secondary | ICD-10-CM | POA: Diagnosis not present

## 2018-05-20 DIAGNOSIS — K224 Dyskinesia of esophagus: Secondary | ICD-10-CM | POA: Diagnosis not present

## 2018-05-20 DIAGNOSIS — E441 Mild protein-calorie malnutrition: Secondary | ICD-10-CM | POA: Diagnosis not present

## 2018-05-22 DIAGNOSIS — Z9889 Other specified postprocedural states: Secondary | ICD-10-CM | POA: Diagnosis not present

## 2018-05-22 DIAGNOSIS — K9189 Other postprocedural complications and disorders of digestive system: Secondary | ICD-10-CM | POA: Diagnosis not present

## 2018-05-25 DIAGNOSIS — K224 Dyskinesia of esophagus: Secondary | ICD-10-CM | POA: Diagnosis not present

## 2018-05-25 DIAGNOSIS — E441 Mild protein-calorie malnutrition: Secondary | ICD-10-CM | POA: Diagnosis not present

## 2018-05-25 DIAGNOSIS — Z9884 Bariatric surgery status: Secondary | ICD-10-CM | POA: Diagnosis not present

## 2018-05-28 DIAGNOSIS — K224 Dyskinesia of esophagus: Secondary | ICD-10-CM | POA: Diagnosis not present

## 2018-05-28 DIAGNOSIS — Z9884 Bariatric surgery status: Secondary | ICD-10-CM | POA: Diagnosis not present

## 2018-05-28 DIAGNOSIS — E441 Mild protein-calorie malnutrition: Secondary | ICD-10-CM | POA: Diagnosis not present

## 2018-06-01 DIAGNOSIS — K224 Dyskinesia of esophagus: Secondary | ICD-10-CM | POA: Diagnosis not present

## 2018-06-01 DIAGNOSIS — Z9884 Bariatric surgery status: Secondary | ICD-10-CM | POA: Diagnosis not present

## 2018-06-01 DIAGNOSIS — E441 Mild protein-calorie malnutrition: Secondary | ICD-10-CM | POA: Diagnosis not present

## 2018-06-03 DIAGNOSIS — Z789 Other specified health status: Secondary | ICD-10-CM | POA: Diagnosis not present

## 2018-06-03 DIAGNOSIS — Z9889 Other specified postprocedural states: Secondary | ICD-10-CM | POA: Diagnosis not present

## 2018-06-03 DIAGNOSIS — Z931 Gastrostomy status: Secondary | ICD-10-CM | POA: Diagnosis not present

## 2018-06-05 DIAGNOSIS — K224 Dyskinesia of esophagus: Secondary | ICD-10-CM | POA: Diagnosis not present

## 2018-06-05 DIAGNOSIS — Z9884 Bariatric surgery status: Secondary | ICD-10-CM | POA: Diagnosis not present

## 2018-06-05 DIAGNOSIS — E441 Mild protein-calorie malnutrition: Secondary | ICD-10-CM | POA: Diagnosis not present

## 2018-06-08 DIAGNOSIS — E441 Mild protein-calorie malnutrition: Secondary | ICD-10-CM | POA: Diagnosis not present

## 2018-06-08 DIAGNOSIS — K224 Dyskinesia of esophagus: Secondary | ICD-10-CM | POA: Diagnosis not present

## 2018-06-08 DIAGNOSIS — Z9884 Bariatric surgery status: Secondary | ICD-10-CM | POA: Diagnosis not present

## 2018-06-10 DIAGNOSIS — Z4659 Encounter for fitting and adjustment of other gastrointestinal appliance and device: Secondary | ICD-10-CM | POA: Diagnosis not present

## 2018-06-10 DIAGNOSIS — Z9889 Other specified postprocedural states: Secondary | ICD-10-CM | POA: Diagnosis not present

## 2018-06-10 DIAGNOSIS — Z48815 Encounter for surgical aftercare following surgery on the digestive system: Secondary | ICD-10-CM | POA: Diagnosis not present

## 2018-06-29 IMAGING — CR DG CHEST 2V
1 series · 2 of 2 positions shown · non-contrast
Comparison: Chest x-ray of October 23, 2016

CLINICAL DATA: Preoperative examination prior lumbar decompression.
No known cardiopulmonary disease. Current smoker.

EXAM:
CHEST  2 VIEW

[Series 1: w chest pa · 0.14mm/px · 2 of 2 slices shown]
[im 1/2]
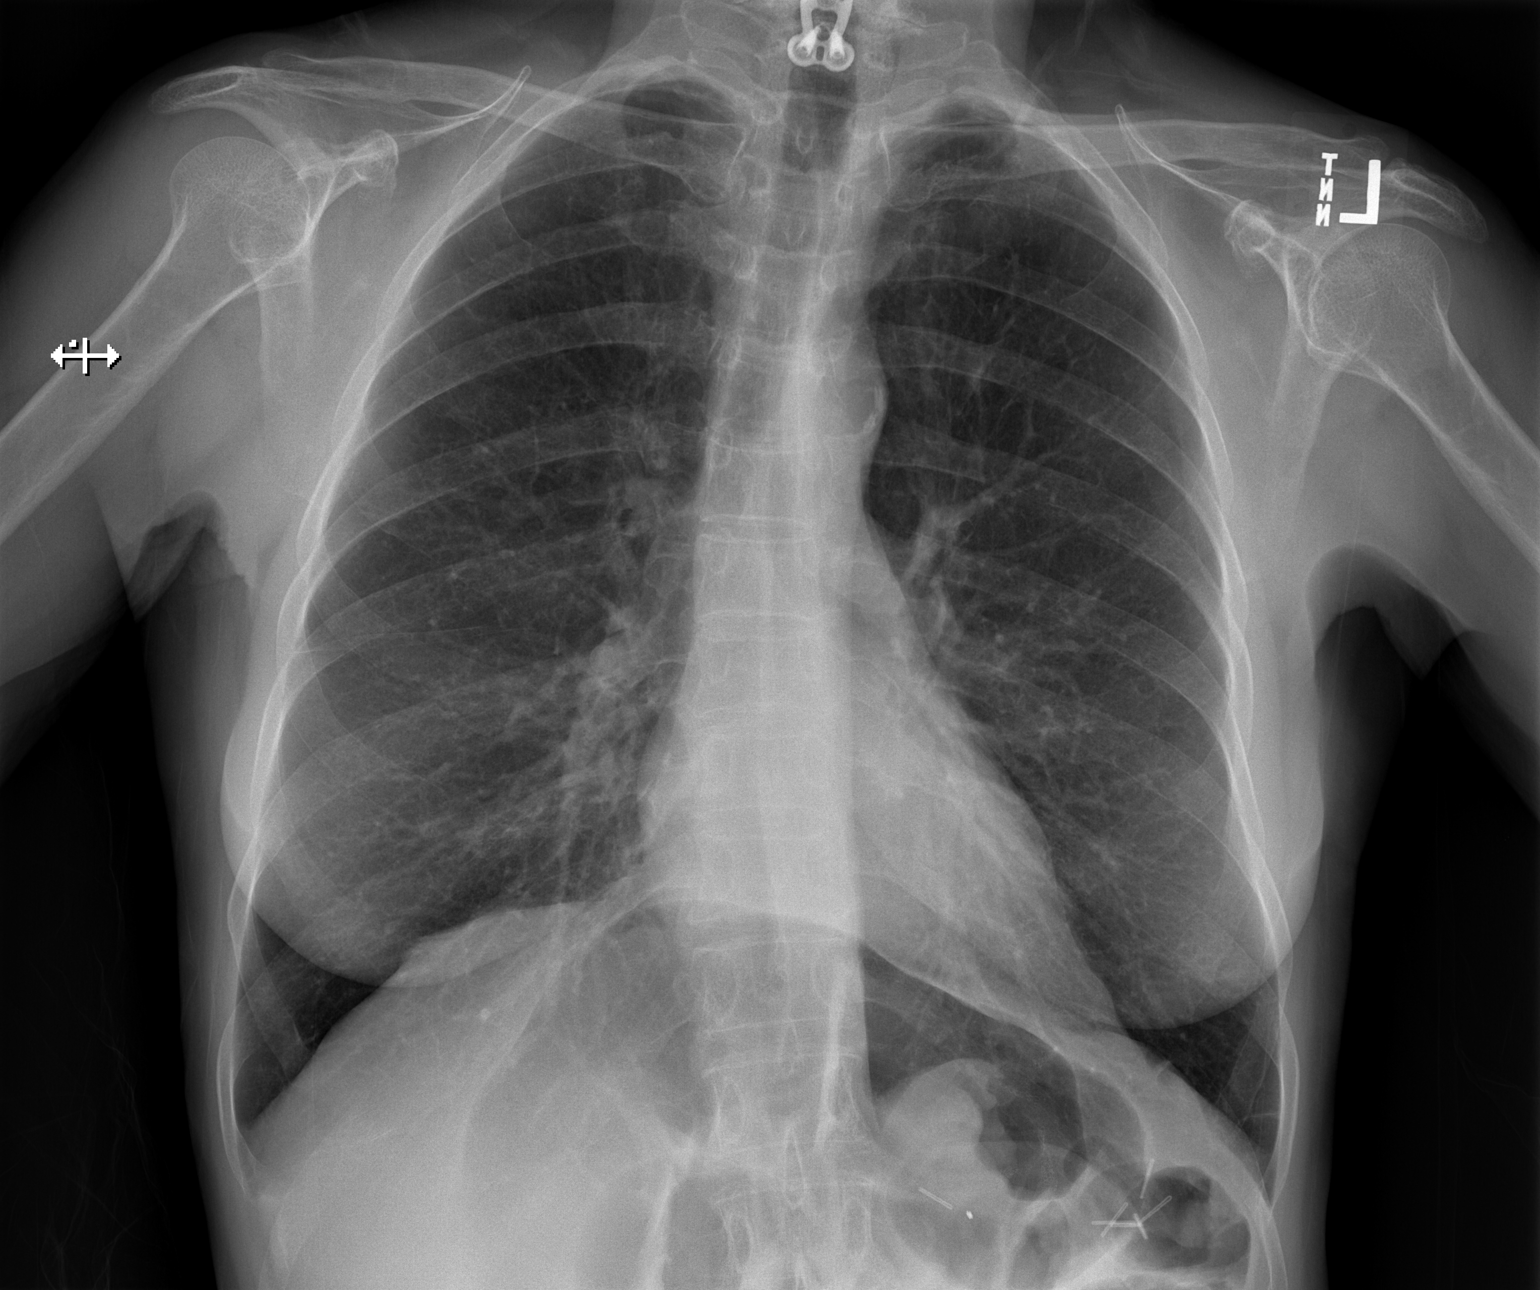
[im 2/2]
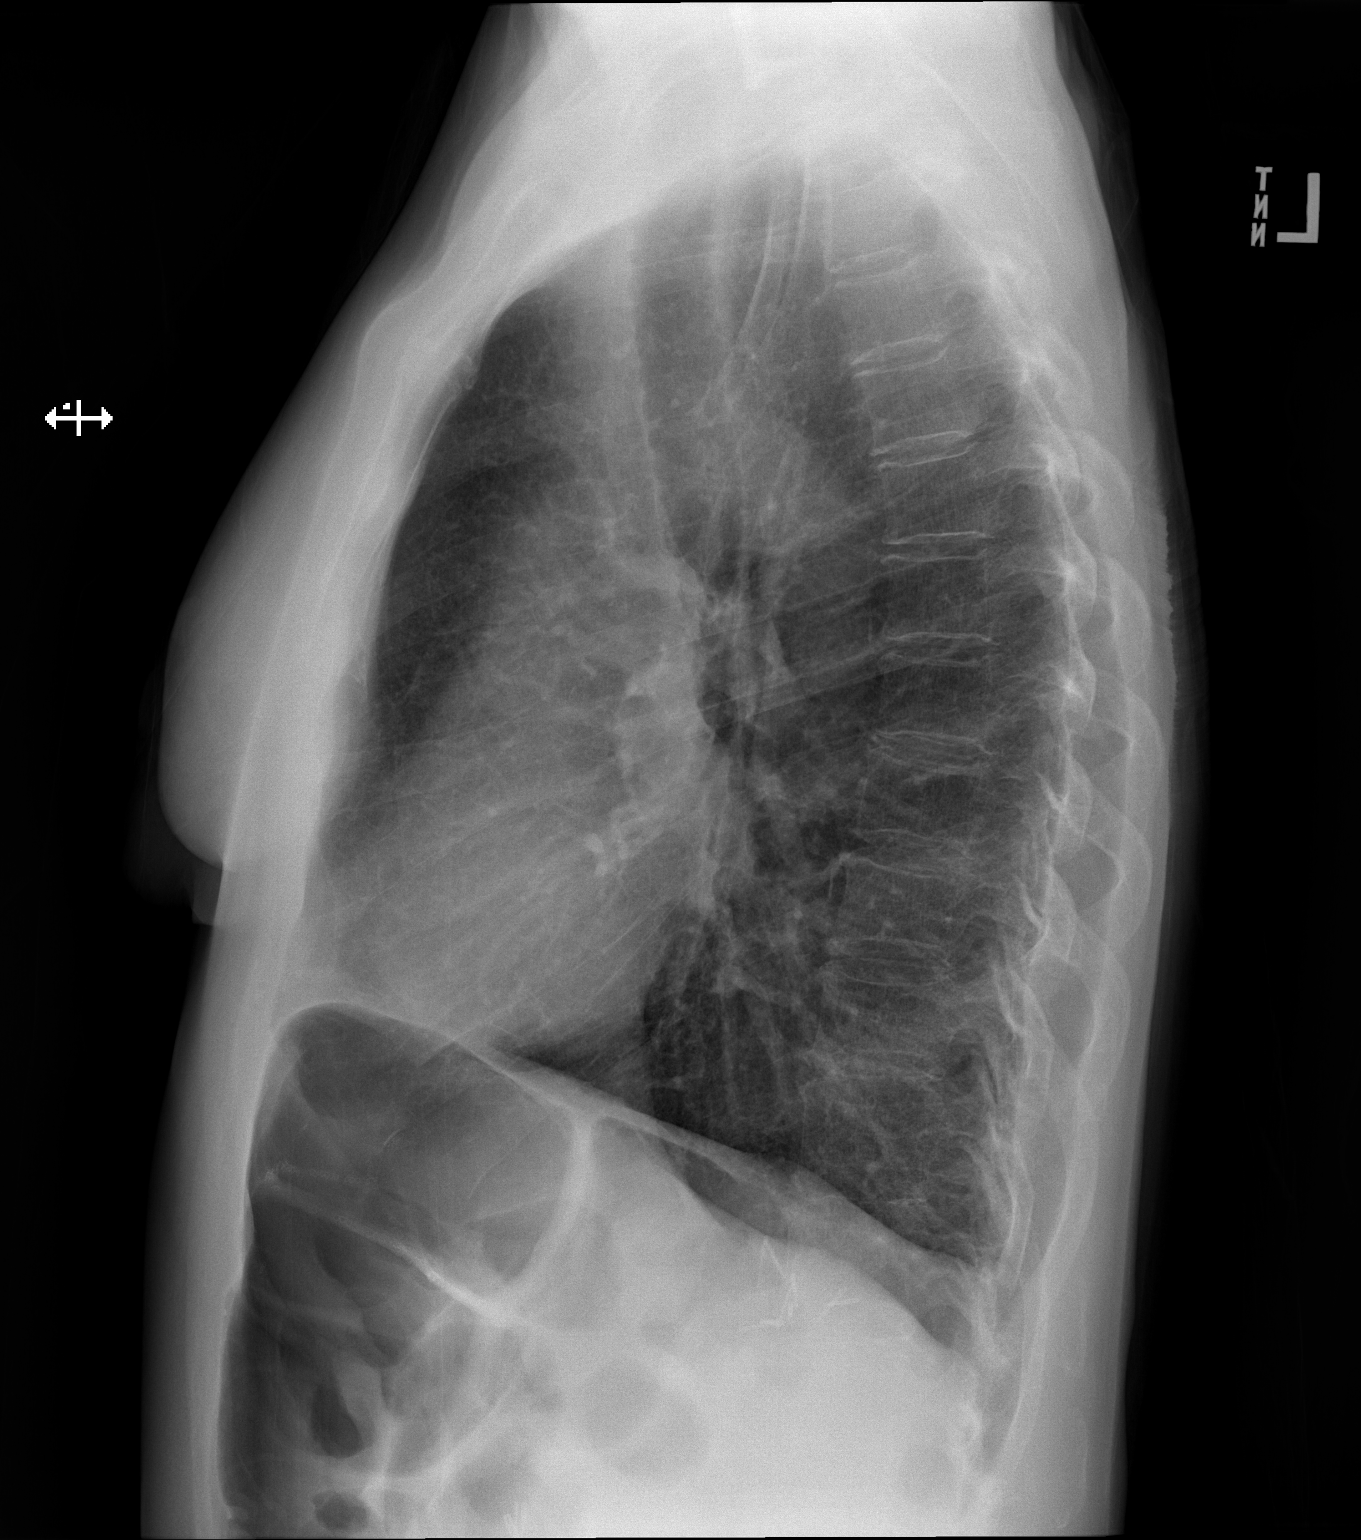

[2 of 2 positions shown; findings below may reference images not displayed]

FINDINGS: The lungs remain hyperinflated. The interstitial markings are coarse
but stable. The heart and pulmonary vascularity are normal. The
mediastinum is normal in width. There is calcification in the wall
of the aortic arch. There is gentle dextrocurvature centered in the
mid to lower thoracic spine which is stable.
IMPRESSION: COPD. No pneumonia, CHF, nor other acute cardiopulmonary
abnormality.

Thoracic aortic atherosclerosis.

## 2018-07-08 ENCOUNTER — Ambulatory Visit (INDEPENDENT_AMBULATORY_CARE_PROVIDER_SITE_OTHER): Payer: Medicare Other | Admitting: Internal Medicine

## 2018-07-08 ENCOUNTER — Encounter: Payer: Self-pay | Admitting: Internal Medicine

## 2018-07-08 VITALS — BP 116/68 | HR 99 | Temp 97.8°F | Resp 15 | Ht 59.0 in | Wt 101.4 lb

## 2018-07-08 DIAGNOSIS — Z23 Encounter for immunization: Secondary | ICD-10-CM | POA: Diagnosis not present

## 2018-07-08 DIAGNOSIS — Z720 Tobacco use: Secondary | ICD-10-CM

## 2018-07-08 DIAGNOSIS — R1310 Dysphagia, unspecified: Secondary | ICD-10-CM | POA: Diagnosis not present

## 2018-07-08 DIAGNOSIS — K2100 Gastro-esophageal reflux disease with esophagitis, without bleeding: Secondary | ICD-10-CM

## 2018-07-08 DIAGNOSIS — M62838 Other muscle spasm: Secondary | ICD-10-CM | POA: Diagnosis not present

## 2018-07-08 DIAGNOSIS — F409 Phobic anxiety disorder, unspecified: Secondary | ICD-10-CM

## 2018-07-08 DIAGNOSIS — M818 Other osteoporosis without current pathological fracture: Secondary | ICD-10-CM

## 2018-07-08 DIAGNOSIS — G514 Facial myokymia: Secondary | ICD-10-CM | POA: Diagnosis not present

## 2018-07-08 DIAGNOSIS — Z1231 Encounter for screening mammogram for malignant neoplasm of breast: Secondary | ICD-10-CM

## 2018-07-08 DIAGNOSIS — Z1239 Encounter for other screening for malignant neoplasm of breast: Secondary | ICD-10-CM

## 2018-07-08 DIAGNOSIS — R1319 Other dysphagia: Secondary | ICD-10-CM

## 2018-07-08 DIAGNOSIS — F5105 Insomnia due to other mental disorder: Secondary | ICD-10-CM

## 2018-07-08 LAB — COMPREHENSIVE METABOLIC PANEL
ALBUMIN: 4.2 g/dL (ref 3.5–5.2)
ALK PHOS: 46 U/L (ref 39–117)
ALT: 19 U/L (ref 0–35)
AST: 24 U/L (ref 0–37)
BILIRUBIN TOTAL: 0.4 mg/dL (ref 0.2–1.2)
BUN: 14 mg/dL (ref 6–23)
CHLORIDE: 107 meq/L (ref 96–112)
CO2: 23 mEq/L (ref 19–32)
CREATININE: 0.75 mg/dL (ref 0.40–1.20)
Calcium: 8.9 mg/dL (ref 8.4–10.5)
GFR: 81.04 mL/min (ref >60.00)
Glucose, Bld: 85 mg/dL (ref 70–99)
Potassium: 4.3 mEq/L (ref 3.5–5.1)
SODIUM: 139 meq/L (ref 135–145)
TOTAL PROTEIN: 6.4 g/dL (ref 6.0–8.3)

## 2018-07-08 LAB — MAGNESIUM: MAGNESIUM: 1.9 mg/dL (ref 1.5–2.5)

## 2018-07-08 MED ORDER — TRAZODONE HCL 100 MG PO TABS: 200.0000 mg | ORAL_TABLET | Freq: Every day | ORAL | 1 refills | Status: DC

## 2018-07-08 MED ORDER — CLONAZEPAM 0.5 MG PO TABS: 0.5000 mg | ORAL_TABLET | Freq: Every day | ORAL | 5 refills | Status: DC | PRN

## 2018-07-08 NOTE — Patient Instructions (Addendum)
Try Using Beano befrore any meal with greens,  To reduce bloating/gas.  You can use Gas X afterward  You need to drink at least 60 ounces of non caffeinated beverages  QUIT SMOKING!   Stay on the front porch with brother if he refuses to quit smoking   I am increasing your trazodone to 150 of 200 mg daily at bedtime

## 2018-07-08 NOTE — Progress Notes (Signed)
Subjective:  Patient ID: Tamara [name], female    DOB: January 25, 1946  Age: 72 y.o. MRN: [id number]  CC: The primary encounter diagnosis was Muscle spasm. Diagnoses of Need for influenza vaccination, Facial twitching, Esophageal dysphagia, Tobacco abuse, Reflux esophagitis, Other osteoporosis without current pathological fracture, and Insomnia due to anxiety and fear were also pertinent to this visit.  HPI Tamara [name] presents for 6 month follow up .  Since her last visit she had an open funduplication takedown , partial gastrectomy and G tube placement to resolve esophageal stricture .  Surgery was done  at [facility] on June 18th  . Esophageal leak noted prior to D/c so TPN was continued.  Clear liquids were started July 24,  And by July 30 she was tolerating full diet so TPN was stopped.  PICC line removed and G tube removed  [facility]   appetite good,  Best weight was 107 while on TPN  Dropped to 99 when TPN was stopped and now on the rise  Again. .  Some pain with swallowing , mild  Not interfering with eating.  Site of G tube is tender as well   Had quit smoking until last week when her alcoholic brother came to visit and smoked in her house despite her requesting him not to.   Facial twitching noticed today. Patient unaware  Not sleeping .  Has run out of clonazepam . Using increased amounts of  trazodone . Moving bowels daily      Outpatient Medications Prior to Visit  Medication Sig Dispense Refill  . nortriptyline (PAMELOR) 25 MG capsule TAKE 1 CAPSULE BY MOUTH AT BEDTIME 90 capsule 1  . omeprazole (PRILOSEC) 20 MG capsule Take 20 mg by mouth daily.    . sertraline (ZOLOFT) 100 MG tablet Take 1 tablet (100 mg total) by mouth daily. 30 tablet 5  . sucralfate (CARAFATE) 1 g tablet Take 1 tablet (1 g total) by mouth 4 (four) times daily. Dissolve in water and take before meals and at bedtime. 120 tablet 1  . clonazePAM (KLONOPIN) 0.5 MG tablet Take 1 tablet (0.5 mg total) by mouth  daily as needed for anxiety. 30 tablet 5  . traZODone (DESYREL) 100 MG tablet TAKE 1 TABLET BY MOUTH AT BEDTIME 90 tablet 1  . amoxicillin-clavulanate (AUGMENTIN) 875-125 MG tablet Take 1 tablet by mouth 2 (two) times daily. (Patient not taking: Reported on 07/08/2018) 14 tablet 0  . dexlansoprazole (DEXILANT) 60 MG capsule Take 1 capsule (60 mg total) by mouth daily. (Patient not taking: Reported on 07/08/2018) 30 capsule 2  . guaiFENesin-codeine (ROBITUSSIN AC) 100-10 MG/5ML syrup Take 5 mLs by mouth 3 (three) times daily as needed for cough. (Patient not taking: Reported on 07/08/2018) 120 mL 0  . promethazine-phenylephrine (PROMETHAZINE VC) 6.25-5 MG/5ML SYRP Take 5 mLs by mouth every 8 (eight) hours as needed for congestion. (Patient not taking: Reported on 07/08/2018) 280 mL 0  . pseudoephedrine-codeine-guaifenesin (MYTUSSIN DAC) 30-10-100 MG/5ML solution Take 10 mLs by mouth 4 (four) times daily as needed for cough. (Patient not taking: Reported on 07/08/2018) 240 mL 0  . sertraline (ZOLOFT) 100 MG tablet TAKE 1 TABLET BY MOUTH ONCE DAILY (Patient not taking: Reported on 07/08/2018) 90 tablet 1   No facility-administered medications prior to visit.     Review of Systems;  Patient denies headache, fevers, malaise, unintentional weight loss, skin rash, eye pain, sinus congestion and sinus pain, sore throat, dysphagia,  hemoptysis , cough, dyspnea, wheezing, chest  pain, palpitations, orthopnea, edema, abdominal pain, nausea, melena, diarrhea, constipation, flank pain, dysuria, hematuria, urinary  Frequency, nocturia, numbness, tingling, seizures,  Focal weakness, Loss of consciousness,  Tremor, i depression,  and suicidal ideation.      Objective:  BP 116/68 (BP Location: Left Arm, Patient Position: Sitting, Cuff Size: Normal)   Pulse 99   Temp 97.8 F (36.6 C) (Oral)   Resp 15   Ht 4' 11"  (1.499 m)   Wt 101 lb 6.4 oz (46 kg)   SpO2 96%   BMI 20.48 kg/m   BP Readings from Last 3  Encounters:  07/08/18 116/68  01/21/18 110/62  01/08/18 116/70    Wt Readings from Last 3 Encounters:  07/08/18 101 lb 6.4 oz (46 kg)  01/21/18 105 lb 6.4 oz (47.8 kg)  01/08/18 103 lb (46.7 kg)    General appearance: alert, cooperative and appears stated age Ears: normal TM's and external ear canals both ears Throat: lips, mucosa, and tongue normal; teeth and gums normal Neck: no adenopathy, no carotid bruit, supple, symmetrical, trachea midline and thyroid not enlarged, symmetric, no tenderness/mass/nodules Back: symmetric, no curvature. ROM normal. No CVA tenderness. Lungs: clear to auscultation bilaterally Heart: regular rate and rhythm, S1, S2 normal, no murmur, click, rub or gallop Abdomen: soft, non-tender; bowel sounds normal; no masses,  no organomegaly Pulses: 2+ and symmetric Skin: Skin color, texture, turgor normal. No rashes or lesions Lymph nodes: Cervical, supraclavicular, and axillary nodes normal. Neuro: CNs 2-12 intact. DTRs 2+/4 in biceps, brachioradialis, patellars and achilles. Muscle strength 5/5 in upper and lower exremities. Fine resting tremor bilaterally both hands cerebellar function normal. Romberg negative.  No pronator drift.   Gait normal.   No results found for: HGBA1C  Lab Results  Component Value Date   CREATININE 0.75 07/08/2018   CREATININE 0.73 04/06/2018   CREATININE 0.89 12/02/2017    Lab Results  Component Value Date   WBC 10.3 04/06/2018   HGB 12.9 04/06/2018   HCT 40.3 04/06/2018   PLT 259 04/06/2018   GLUCOSE 85 07/08/2018   CHOL 189 06/11/2017   TRIG 58 04/06/2018   HDL 67.20 06/11/2017   LDLDIRECT 121.1 01/26/2014   LDLCALC 87 06/11/2017   ALT 19 07/08/2018   AST 24 07/08/2018   NA 139 07/08/2018   K 4.3 07/08/2018   CL 107 07/08/2018   CREATININE 0.75 07/08/2018   BUN 14 07/08/2018   CO2 23 07/08/2018   TSH 1.74 06/11/2017   INR 0.85 12/18/2016    No results found.  Assessment & Plan:   Problem List Items  Addressed This Visit    Dysphagia    Now improved,  Secondary to reflux esophagitis and Nissen funduplication , s/p open funduplication takedown .  Continue omeprazole  20 mg once daily (dose reduced ) .      Facial twitching    Noticed today. Calcium , magnesium and other lytes normal       Insomnia due to anxiety and fear    Chronic,  The risks of long term benzodiazepine use were discussed with patient today including dementia, excessive sedation leading to respiratory depression,  impaired thinking/driving, and addiction.  Patient was previously taking clonazepam twice daily and has run out.  Weil resume once daily given signs of agitation today.   Advised to continue using trazodone for insomnia      Osteoporosis    Secondary to Crohn's disease and use of steroids.  She has been lost to follow up  with Dr . Jefm Bryant for Reclast.  Last DEXA and infusion unknown.  Will refer  For mangement         Relevant Orders   DG Bone Density   Ambulatory referral to Rheumatology   Reflux esophagitis    Managed with omeprazole 20 mg daily       Tobacco abuse    She has resumed smoking postoperatively  And is well aware of the effects on healing and general health  Smoking cessation instruction/counseling given:  Spent 3 minutes discussing risk of continued tobacco abuse, including but not limited to CAD, PAD, hypertension, and CA.  She is not interested in pharmacotherapy at this time.       Other Visit Diagnoses    Muscle spasm    -  Primary   Relevant Orders   Calcium, ionized (Completed)   Comprehensive metabolic panel (Completed)   Magnesium (Completed)   Need for influenza vaccination       Relevant Orders   Flu Vaccine QUAD 6+ mos PF IM (Fluarix Quad PF) (Completed)      I have discontinued Jeffie R. Carver's dexlansoprazole, amoxicillin-clavulanate, pseudoephedrine-codeine-guaifenesin, promethazine-phenylephrine, and guaiFENesin-codeine. I have also changed her traZODone.  Additionally, I am having her maintain her sertraline, omeprazole, sucralfate, nortriptyline, and clonazePAM.  Meds ordered this encounter  Medications  . clonazePAM (KLONOPIN) 0.5 MG tablet    Sig: Take 1 tablet (0.5 mg total) by mouth daily as needed for anxiety.    Dispense:  30 tablet    Refill:  5  . traZODone (DESYREL) 100 MG tablet    Sig: Take 2 tablets (200 mg total) by mouth at bedtime.    Dispense:  180 tablet    Refill:  1    Medications Discontinued During This Encounter  Medication Reason  . amoxicillin-clavulanate (AUGMENTIN) 875-125 MG tablet Completed Course  . dexlansoprazole (DEXILANT) 60 MG capsule Patient has not taken in last 30 days  . guaiFENesin-codeine (ROBITUSSIN AC) 100-10 MG/5ML syrup Completed Course  . promethazine-phenylephrine (PROMETHAZINE VC) 6.25-5 MG/5ML SYRP Completed Course  . pseudoephedrine-codeine-guaifenesin (MYTUSSIN DAC) 30-10-100 MG/5ML solution Completed Course  . sertraline (ZOLOFT) 426 MG tablet Duplicate  . clonazePAM (KLONOPIN) 0.5 MG tablet Reorder  . traZODone (DESYREL) 100 MG tablet     Follow-up: Return in about 6 months (around 01/08/2019).   Crecencio Mc, MD

## 2018-07-09 LAB — CALCIUM, IONIZED: Calcium, Ion: 5.01 mg/dL (ref 4.8–5.6)

## 2018-07-11 DIAGNOSIS — F5105 Insomnia due to other mental disorder: Secondary | ICD-10-CM

## 2018-07-11 DIAGNOSIS — F409 Phobic anxiety disorder, unspecified: Secondary | ICD-10-CM | POA: Insufficient documentation

## 2018-07-11 DIAGNOSIS — G514 Facial myokymia: Secondary | ICD-10-CM | POA: Insufficient documentation

## 2018-07-11 HISTORY — DX: Facial myokymia: G51.4

## 2018-07-11 NOTE — Assessment & Plan Note (Signed)
Chronic,  The risks of long term benzodiazepine use were discussed with patient today including dementia, excessive sedation leading to respiratory depression,  impaired thinking/driving, and addiction.  Patient was previously taking clonazepam twice daily and has run out.  Weil resume once daily given signs of agitation today.   Advised to continue using trazodone for insomnia

## 2018-07-11 NOTE — Assessment & Plan Note (Signed)
Now improved,  Secondary to reflux esophagitis and Nissen funduplication , s/p open funduplication takedown .  Continue omeprazole  20 mg once daily (dose reduced ) .

## 2018-07-11 NOTE — Assessment & Plan Note (Signed)
Managed with omeprazole 20 mg daily

## 2018-07-11 NOTE — Assessment & Plan Note (Signed)
Noticed today. Calcium , magnesium and other lytes normal

## 2018-07-11 NOTE — Assessment & Plan Note (Signed)
She has resumed smoking postoperatively  And is well aware of the effects on healing and general health  Smoking cessation instruction/counseling given:  Spent 3 minutes discussing risk of continued tobacco abuse, including but not limited to CAD, PAD, hypertension, and CA.  She is not interested in pharmacotherapy at this time.

## 2018-07-11 NOTE — Assessment & Plan Note (Addendum)
Secondary to Crohn's disease and use of steroids.  She has been lost to follow up with Dr . Jefm Bryant for Reclast.  Last DEXA and infusion unknown.  Will refer  For mangement

## 2018-08-07 ENCOUNTER — Encounter: Payer: Self-pay | Admitting: Internal Medicine

## 2018-08-11 ENCOUNTER — Encounter: Payer: Self-pay | Admitting: Family Medicine

## 2018-08-11 ENCOUNTER — Ambulatory Visit (INDEPENDENT_AMBULATORY_CARE_PROVIDER_SITE_OTHER): Payer: Medicare Other | Admitting: Family Medicine

## 2018-08-11 VITALS — BP 126/68 | HR 102 | Temp 98.5°F | Ht 59.0 in | Wt 102.6 lb

## 2018-08-11 DIAGNOSIS — M62830 Muscle spasm of back: Secondary | ICD-10-CM | POA: Diagnosis not present

## 2018-08-11 DIAGNOSIS — M5442 Lumbago with sciatica, left side: Secondary | ICD-10-CM | POA: Diagnosis not present

## 2018-08-11 MED ORDER — METHYLPREDNISOLONE 4 MG PO TBPK: ORAL_TABLET | ORAL | 0 refills | Status: DC

## 2018-08-11 MED ORDER — ACETAMINOPHEN-CODEINE #3 300-30 MG PO TABS: 1.0000 | ORAL_TABLET | Freq: Three times a day (TID) | ORAL | 0 refills | Status: DC | PRN

## 2018-08-11 MED ORDER — CYCLOBENZAPRINE HCL 5 MG PO TABS: 5.0000 mg | ORAL_TABLET | Freq: Three times a day (TID) | ORAL | 1 refills | Status: DC | PRN

## 2018-08-11 NOTE — Progress Notes (Addendum)
Subjective:    Patient ID: Tamara [name], female    DOB: Oct 28, 1946, 72 y.o.   MRN: [id number]  HPI  Presents to clinic complaining of left-sided back pain after painting her deck. States she was stooping down, twisting a lot to paint all of the deck panels. States the pain has been present for 1 week and will catch her when she does certain movements like leaning to grab something or going from sit to stand.  Has been using ibuprofen and heating pad with little effect.     Patient Active Problem List   Diagnosis Date Noted  . Facial twitching 07/11/2018  . Insomnia due to anxiety and fear 07/11/2018  . Reflux esophagitis 01/24/2018  . Dysphagia 12/04/2017  . Macrocytosis 12/04/2017  . Spinal stenosis, lumbar region, with neurogenic claudication 11/08/2016  . Polycythemia, secondary 10/24/2016  . Medicare annual wellness visit, subsequent 04/16/2016  . Underweight 08/27/2015  . Vitamin D deficiency 08/27/2015  . Amaurosis fugax 12/13/2014  . Uric acid arthropathy 10/08/2014  . Carpal tunnel syndrome 03/08/2014  . B12 deficiency 01/26/2014  . Tobacco abuse 01/26/2014  . Encounter for preventive health examination 05/11/2013  . Osteoporosis 11/19/2011  . Chronic abdominal pain   . H/O small bowel obstruction   . Crohn's disease ([place])   . History of DVT (deep vein thrombosis) 01/10/2007   Social History   Tobacco Use  . Smoking status: Current Every Day Smoker    Packs/day: 1.00    Types: Cigarettes    Last attempt to quit: 12/13/2014    Years since quitting: 3.6  . Smokeless tobacco: Never Used  . Tobacco comment: half to one pack of cigs per day  Substance Use Topics  . Alcohol use: No    Alcohol/week: 0.0 standard drinks    Review of Systems  Constitutional: Negative for chills, fatigue and fever.  HENT: Negative for congestion, ear pain, sinus pain and sore throat.   Eyes: Negative.   Respiratory: Negative for cough, shortness of breath and wheezing.     Cardiovascular: Negative for chest pain, palpitations and leg swelling.  Gastrointestinal: Negative for abdominal pain, diarrhea, nausea and vomiting.  Genitourinary: Negative for dysuria, frequency and urgency.  Musculoskeletal: +left sided back pain Skin: Negative for color change, pallor and rash.  Neurological: Negative for syncope, light-headedness and headaches.  Psychiatric/Behavioral: The patient is not nervous/anxious.       Objective:   Physical Exam  Constitutional: She is oriented to person, place, and time. No distress.  HENT:  Head: Normocephalic and atraumatic.  Eyes: No scleral icterus.  Neck: Normal range of motion. Neck supple. No tracheal deviation present.  Cardiovascular: Regular rhythm and normal heart sounds.  Pulmonary/Chest: Effort normal and breath sounds normal. No respiratory distress.  Musculoskeletal: She exhibits tenderness. She exhibits no edema or deformity.       Lumbar back: She exhibits tenderness.       Back:  Area of tenderness on left low back indicated by red circle on diagram.  Pain in the left low back is also induced with bruising of left leg.  Neurological: She is alert and oriented to person, place, and time. No cranial nerve deficit.  Skin: Skin is warm and dry. No erythema.  Psychiatric: She has a normal mood and affect. Her behavior is normal.  Nursing note and vitals reviewed.     Vitals:   08/11/18 1324  BP: 126/68  Pulse: (!) 102  Temp: 98.5 F (36.9 C)  SpO2:  97%    Assessment & Plan:    Acute left-sided low back pain/muscle spasms of back --  Occurrence of back pain and muscle spasms fits with story of patient stooping for extended periods of time while painting, leading to low back pain.  Patient advised she can continue to use heating pad on sore area.  She will also take steroid taper.  Patient given Flexeril to use as needed for muscle spasms.  Patient also given 10 tablets of Tylenol with codeine to use as needed for  more severe pain.  Discussed stretching/range of motion exercises to help soothe back pain.  PMP narcotic registry checked and is appropriate for Rx of tylenol #3  Keep regular follow-up as already planned.  Return to clinic sooner if issues persist or worsen.

## 2018-09-02 DIAGNOSIS — Z903 Acquired absence of stomach [part of]: Secondary | ICD-10-CM | POA: Diagnosis not present

## 2018-09-02 DIAGNOSIS — K224 Dyskinesia of esophagus: Secondary | ICD-10-CM | POA: Diagnosis not present

## 2018-09-02 DIAGNOSIS — Z5331 Laparoscopic surgical procedure converted to open procedure: Secondary | ICD-10-CM | POA: Diagnosis not present

## 2018-09-02 DIAGNOSIS — K222 Esophageal obstruction: Secondary | ICD-10-CM | POA: Diagnosis not present

## 2018-09-02 DIAGNOSIS — Z48815 Encounter for surgical aftercare following surgery on the digestive system: Secondary | ICD-10-CM | POA: Diagnosis not present

## 2018-09-09 ENCOUNTER — Other Ambulatory Visit: Payer: Self-pay | Admitting: Internal Medicine

## 2018-09-10 ENCOUNTER — Other Ambulatory Visit: Payer: Self-pay | Admitting: Internal Medicine

## 2018-09-10 MED ORDER — VARENICLINE TARTRATE 1 MG PO TABS: 1.0000 mg | ORAL_TABLET | Freq: Two times a day (BID) | ORAL | 3 refills | Status: DC

## 2018-09-10 MED ORDER — VARENICLINE TARTRATE 0.5 MG X 11 & 1 MG X 42 PO MISC: ORAL | 0 refills | Status: DC

## 2018-09-10 NOTE — Progress Notes (Signed)
rx's  for chantix   Need to be set aside along with a Good Rx card for ms [employer]

## 2018-09-21 DIAGNOSIS — M810 Age-related osteoporosis without current pathological fracture: Secondary | ICD-10-CM | POA: Diagnosis not present

## 2018-10-06 DIAGNOSIS — M810 Age-related osteoporosis without current pathological fracture: Secondary | ICD-10-CM | POA: Diagnosis not present

## 2018-10-14 DIAGNOSIS — H26492 Other secondary cataract, left eye: Secondary | ICD-10-CM | POA: Diagnosis not present

## 2018-10-14 DIAGNOSIS — H04122 Dry eye syndrome of left lacrimal gland: Secondary | ICD-10-CM | POA: Diagnosis not present

## 2018-11-09 ENCOUNTER — Other Ambulatory Visit: Payer: Self-pay | Admitting: Internal Medicine

## 2018-12-09 ENCOUNTER — Other Ambulatory Visit: Payer: Self-pay | Admitting: Internal Medicine

## 2018-12-10 NOTE — Telephone Encounter (Signed)
Refilled: 07/08/2018 Last OV: 08/11/2018 Next OV: 01/08/2019

## 2018-12-29 IMAGING — CR DG FEMUR 2+V*L*
4 series · 4 of 4 positions shown · non-contrast
Comparison: None.

CLINICAL DATA: Left leg pain after fall 2 days ago.

EXAM:
LEFT FEMUR 2 VIEWS

[femur ap (1 of 2)]
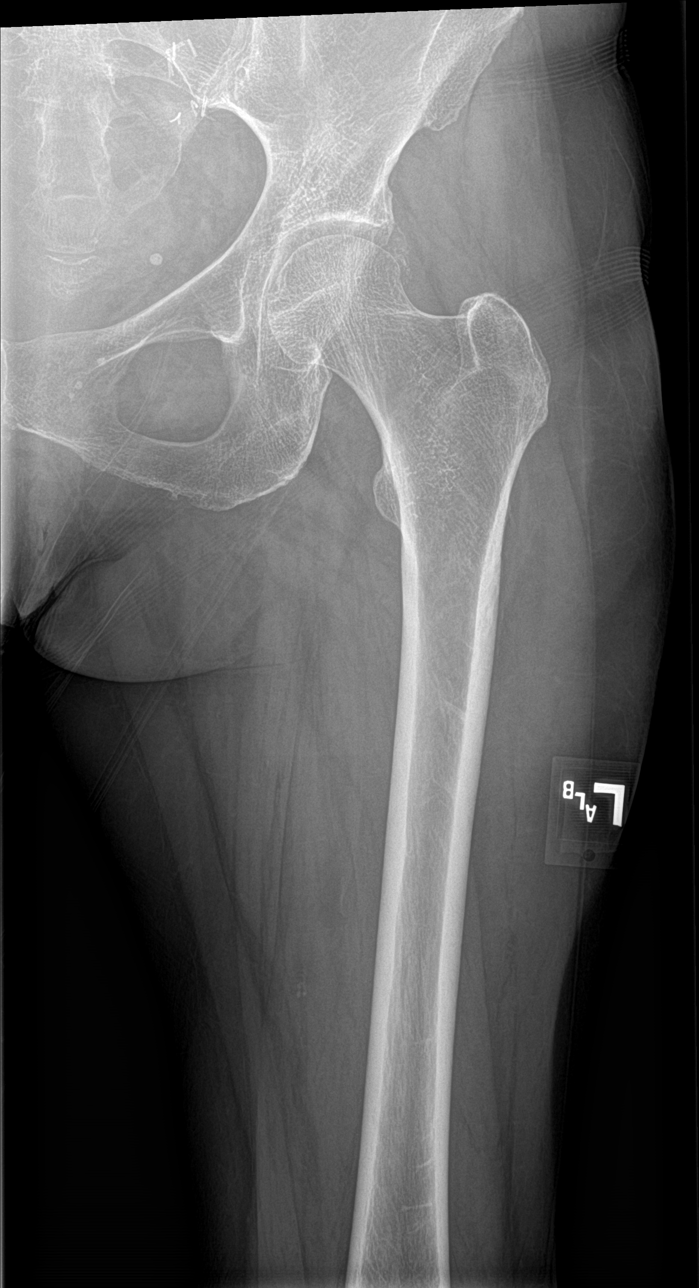

[femur ap (2 of 2)]
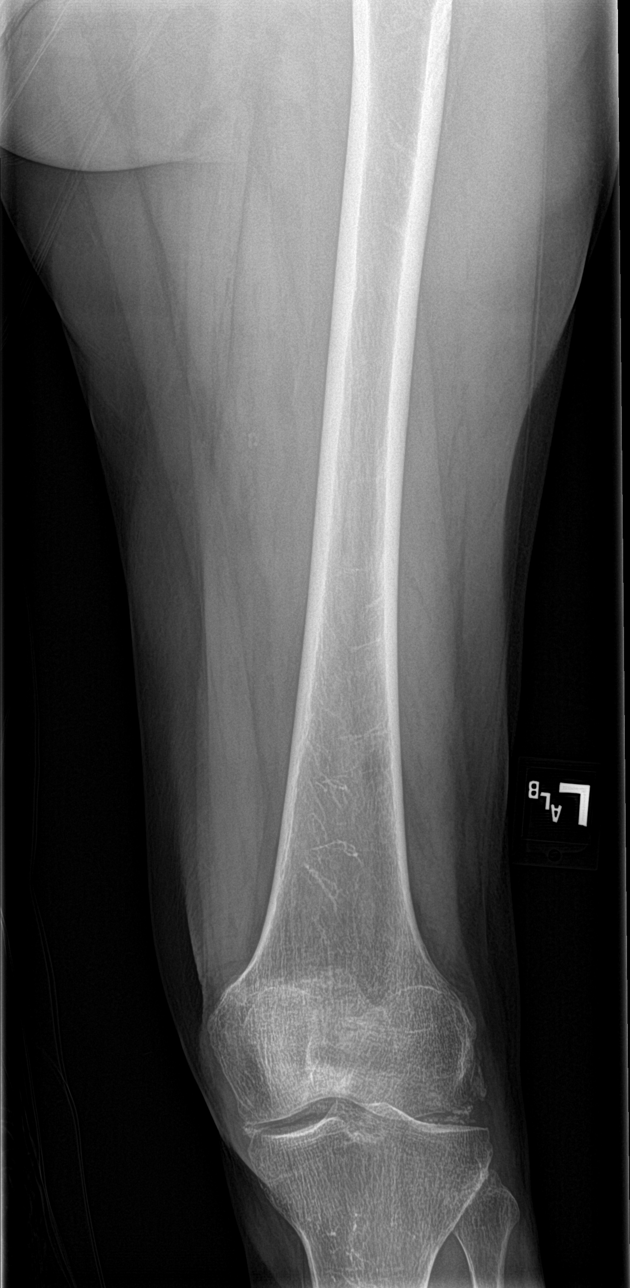

[femur lat (1 of 2)]
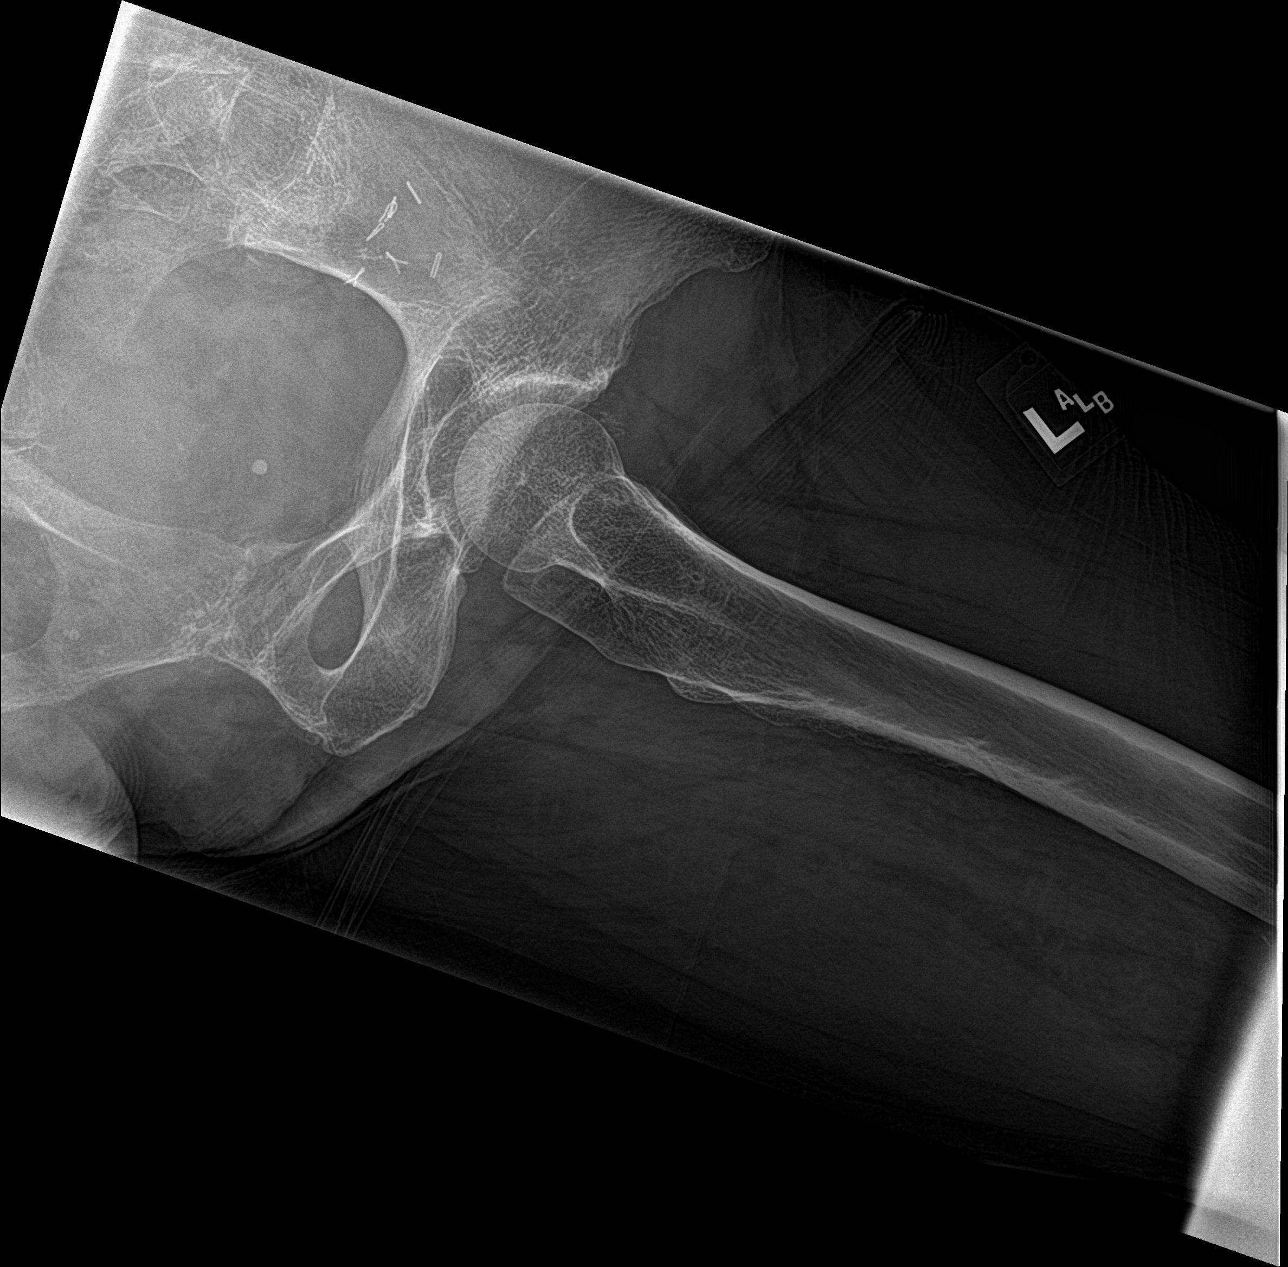

[femur lat (2 of 2)]
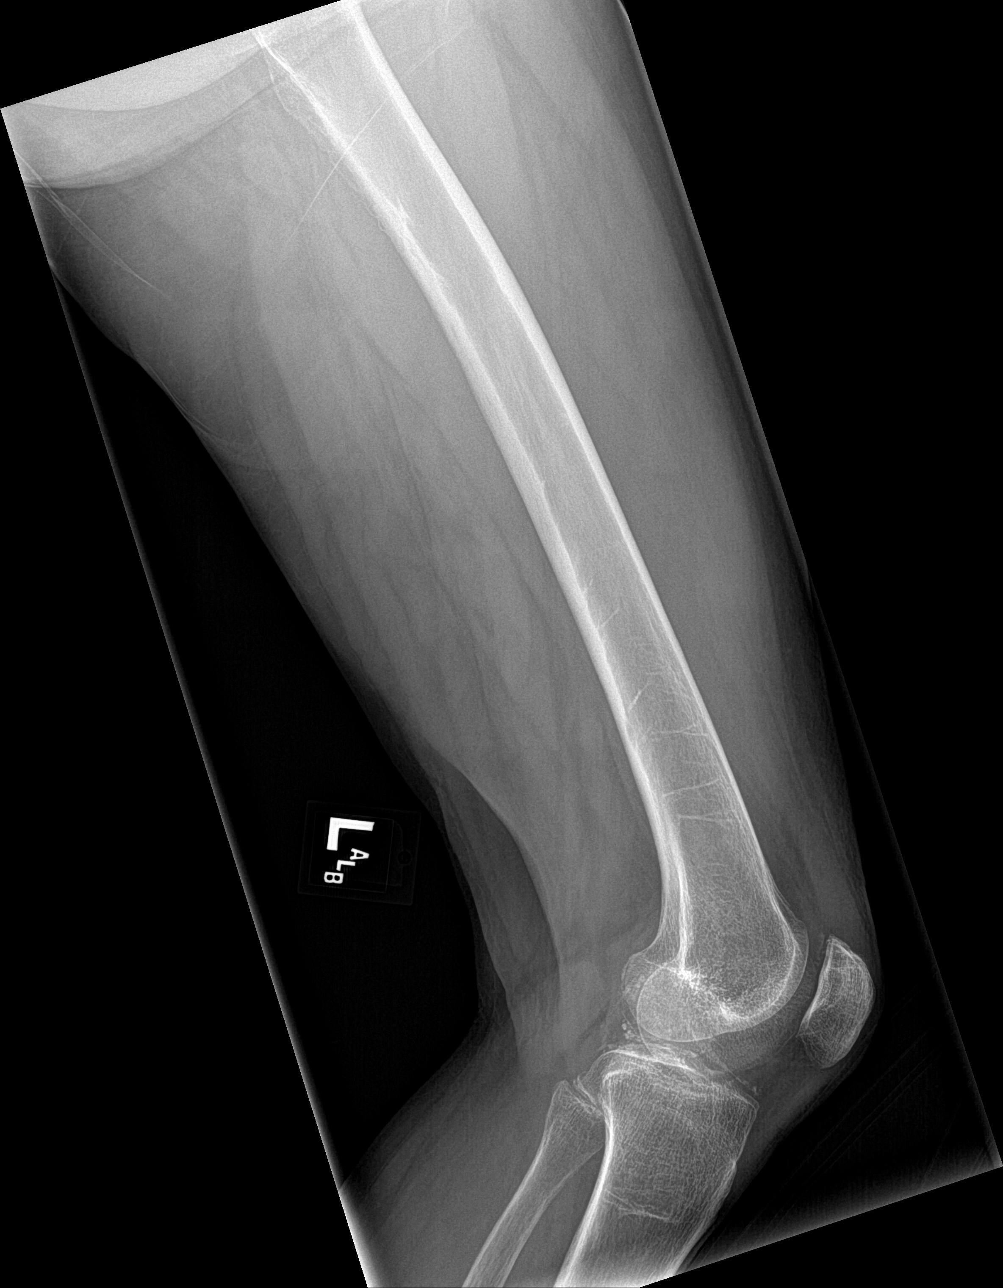

[4 of 4 positions shown; findings below may reference images not displayed]

FINDINGS: There is no evidence of fracture or other focal bone lesions. Soft
tissues are unremarkable.
IMPRESSION: Normal left femur.

## 2018-12-29 IMAGING — CR DG TIBIA/FIBULA 2V*L*
2 series · 2 of 2 positions shown · non-contrast
Comparison: None.

CLINICAL DATA: Left leg pain after fall 2 days ago.

EXAM:
LEFT TIBIA AND FIBULA - 2 VIEW

[tibia ap]
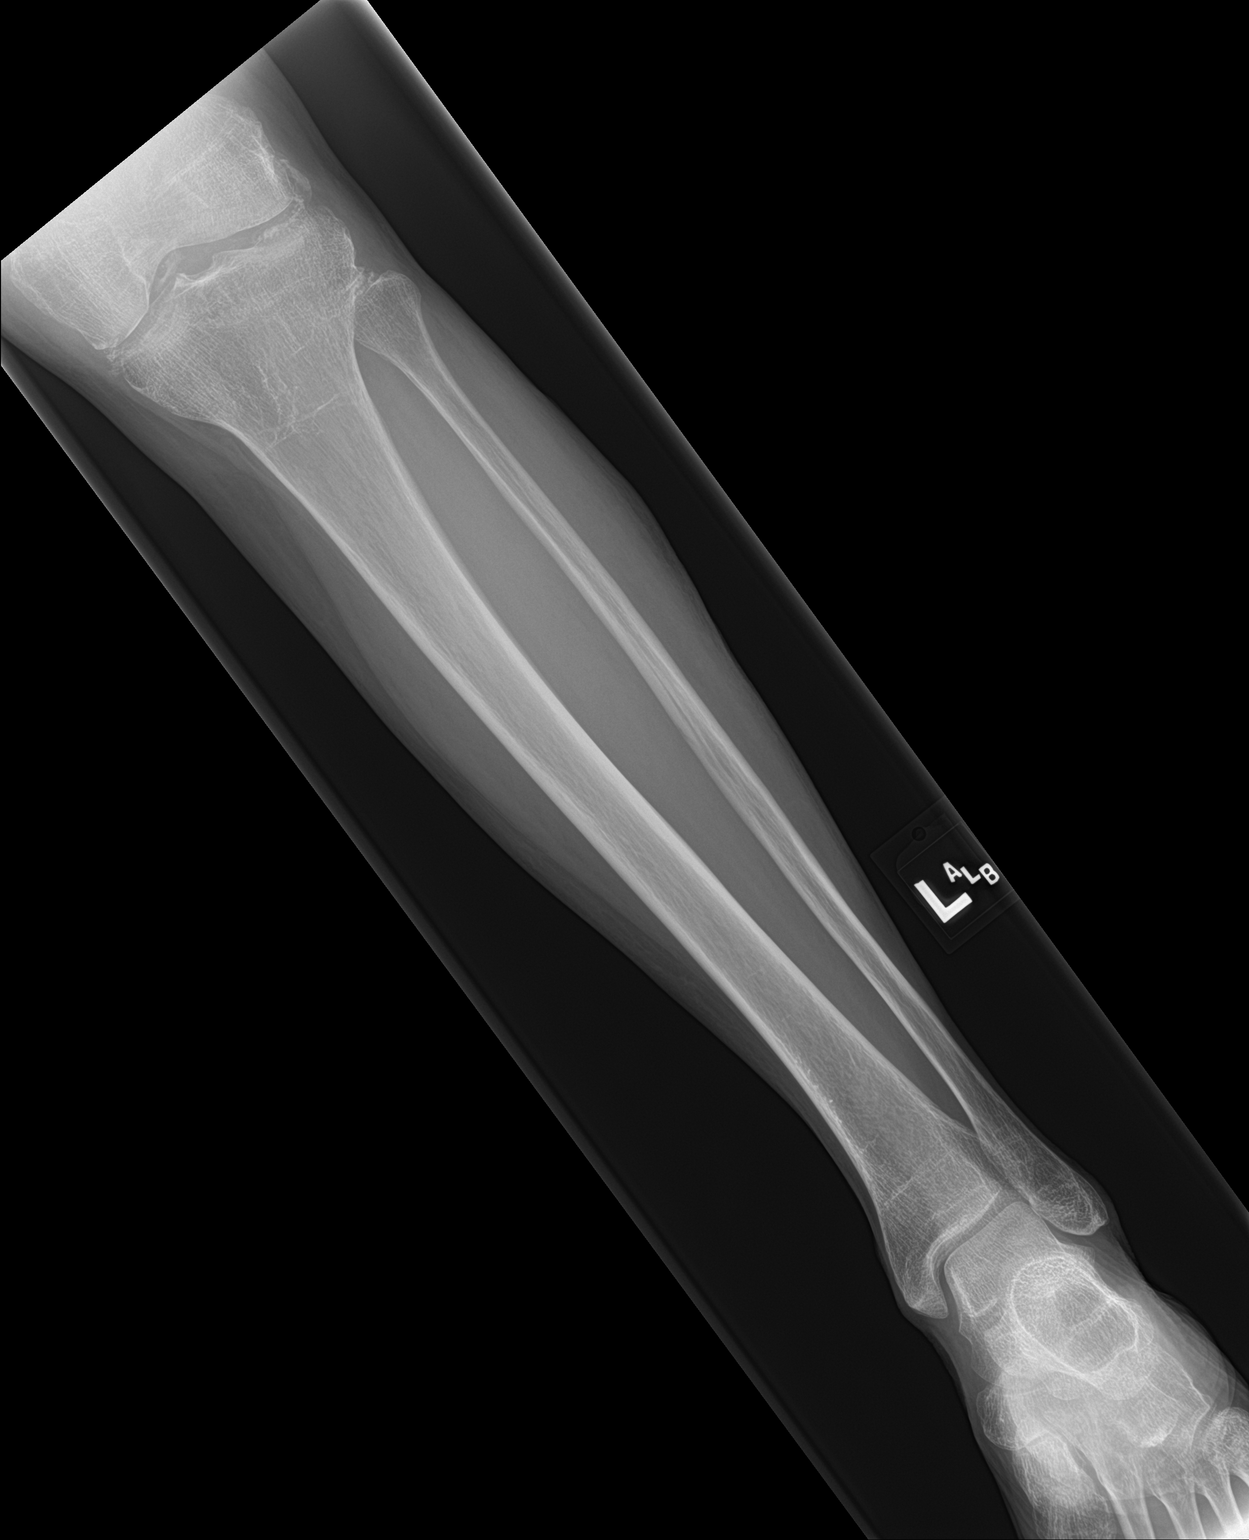

[tibia lat]
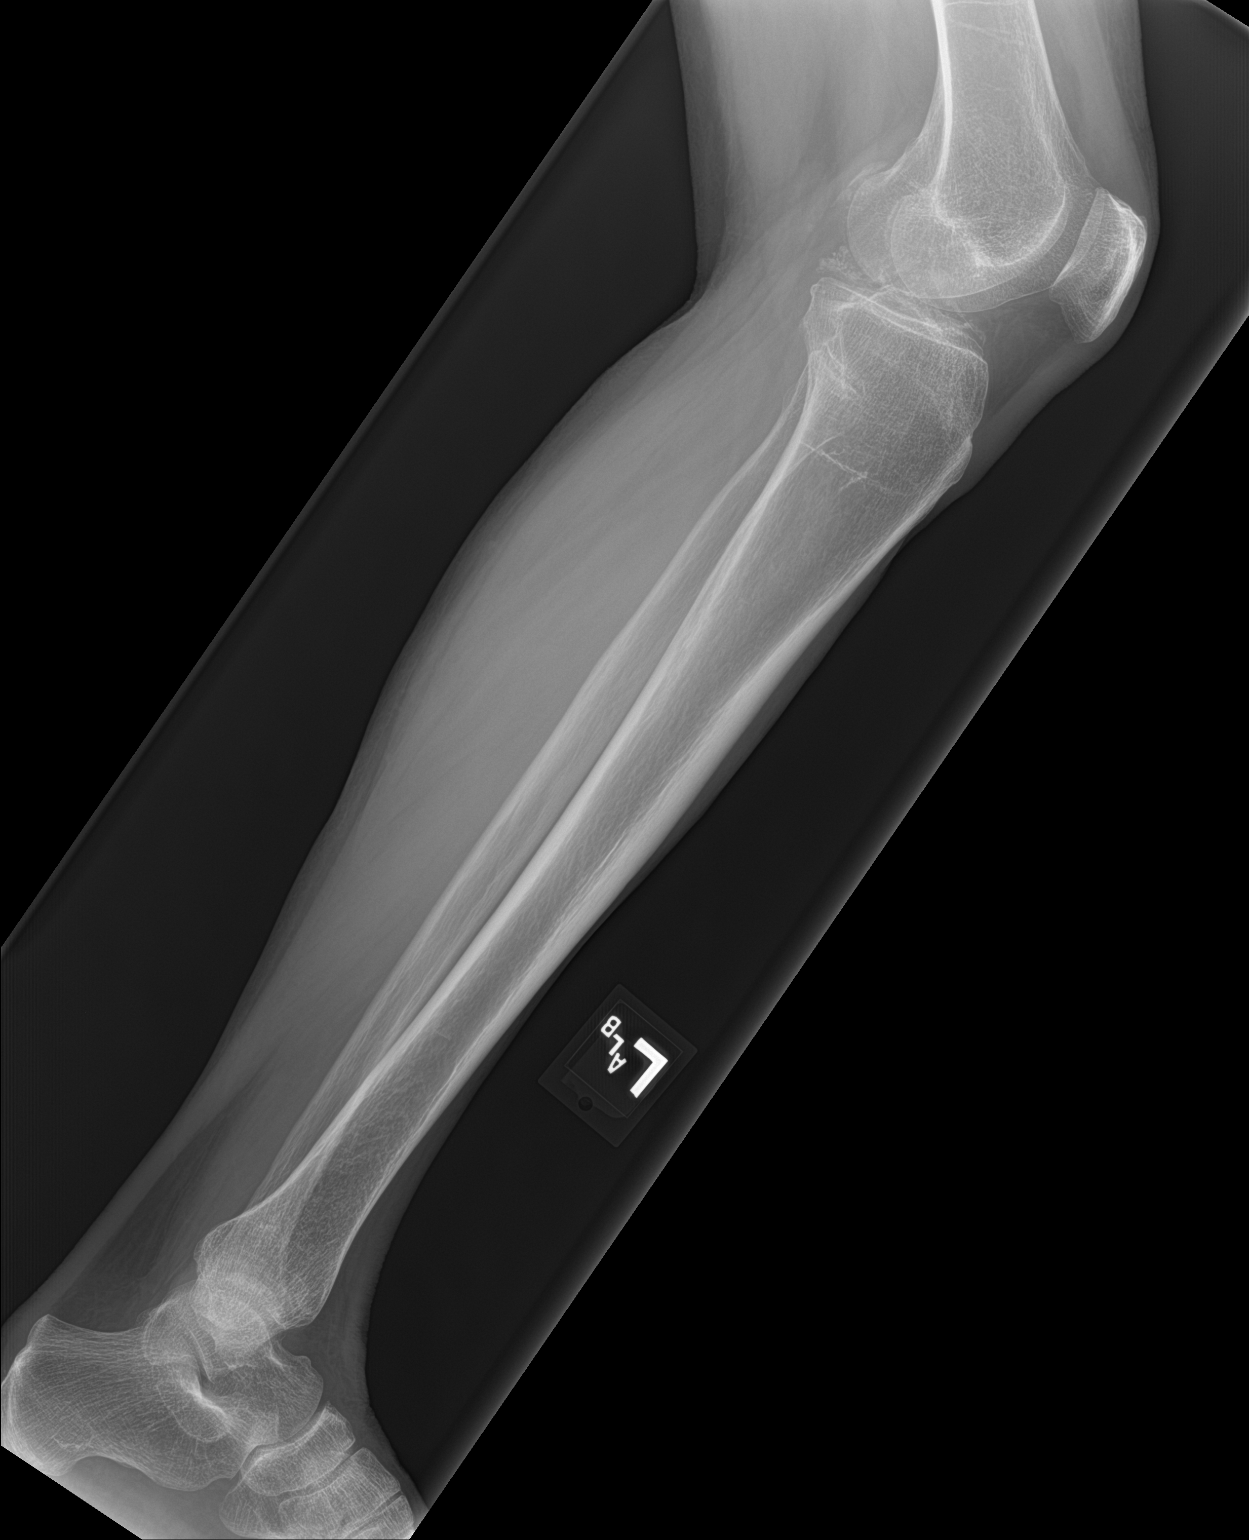

[2 of 2 positions shown; findings below may reference images not displayed]

FINDINGS: There is no evidence of fracture or other focal bone lesions. Soft
tissues are unremarkable.
IMPRESSION: Normal left tibia and fibula.

## 2018-12-29 IMAGING — CR DG KNEE COMPLETE 4+V*L*
4 series · 5 of 5 positions shown · non-contrast
Comparison: Radiographs May 03, 2009.

CLINICAL DATA: Left knee pain after fall 2 days ago.

EXAM:
LEFT KNEE - COMPLETE 4+ VIEW

[knee ap]
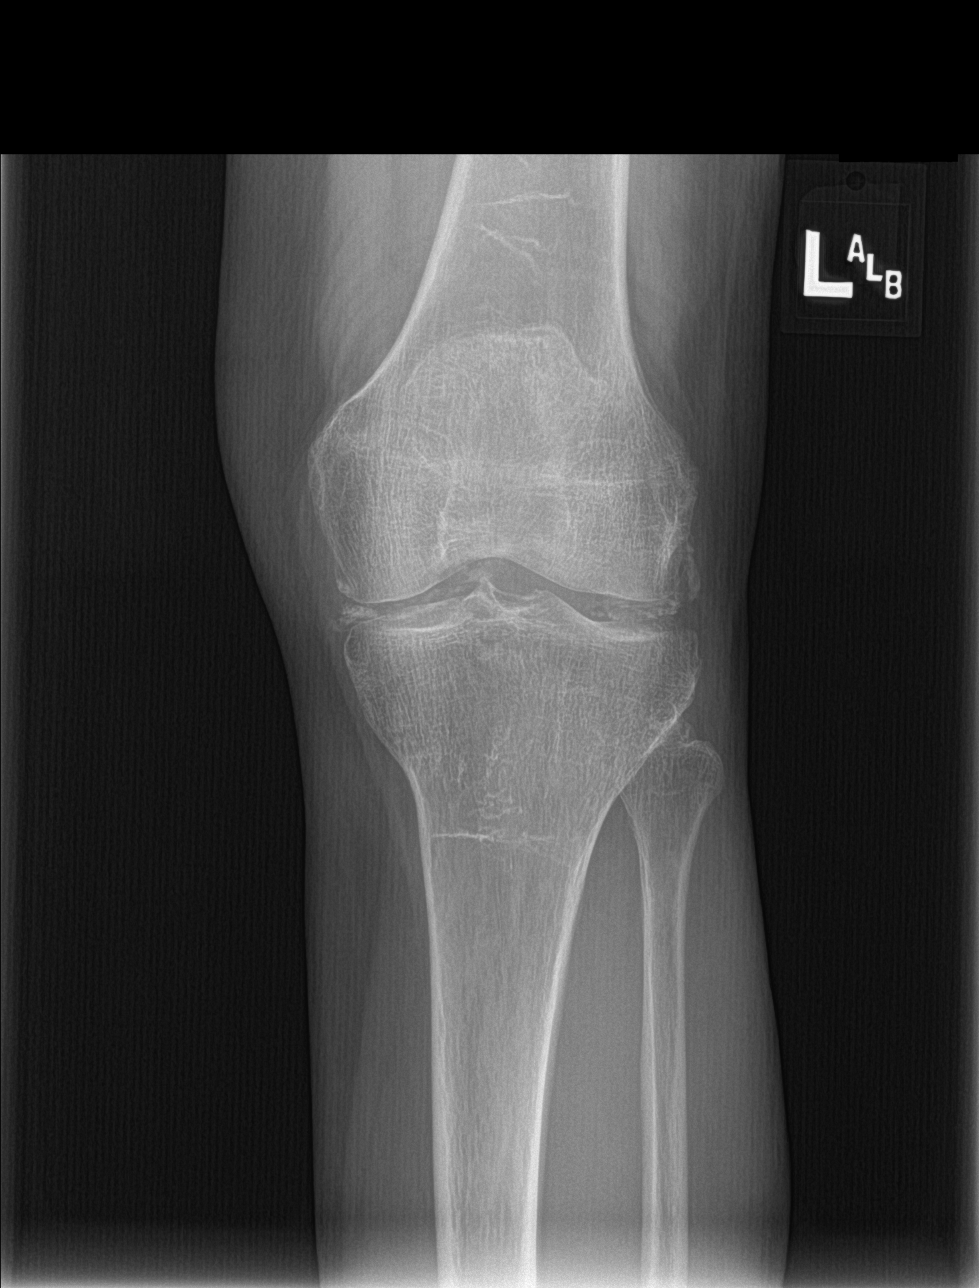

[knee lat]
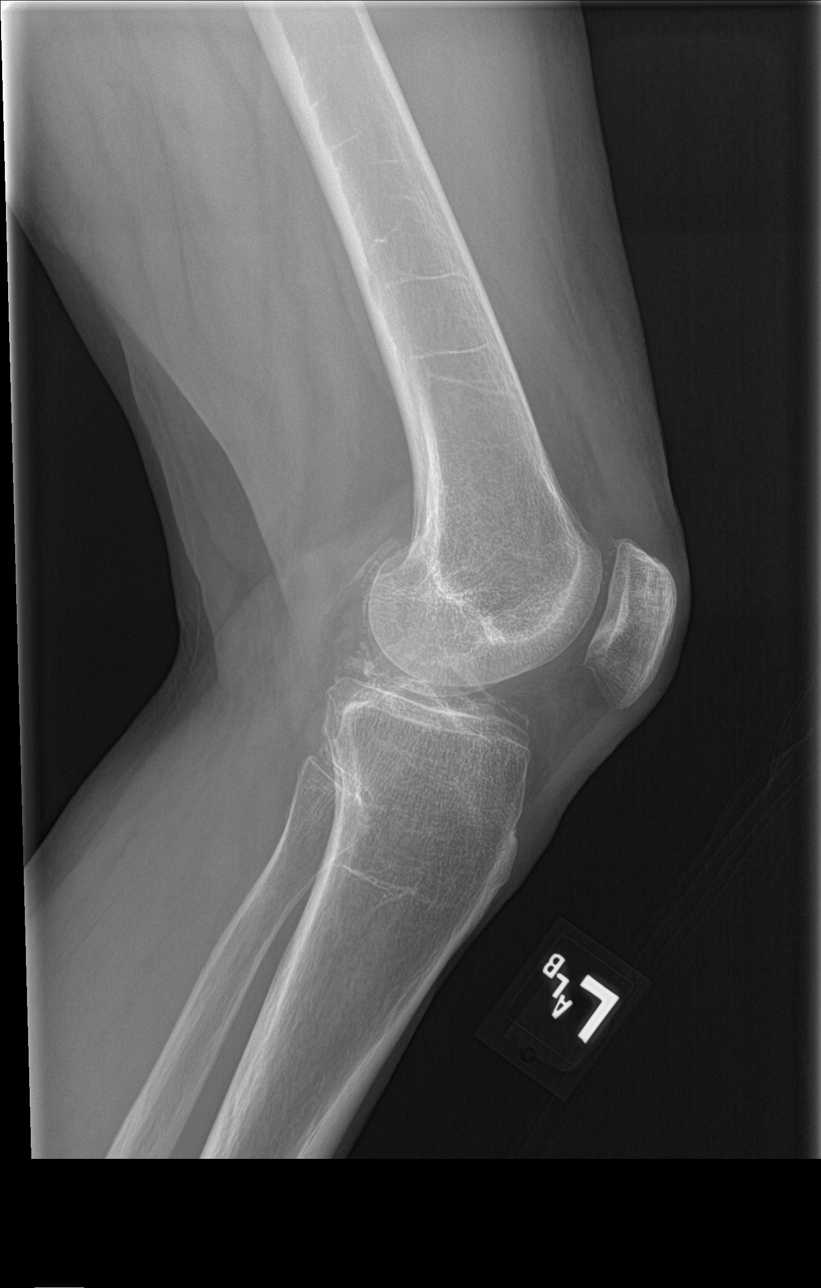

[tunnel]
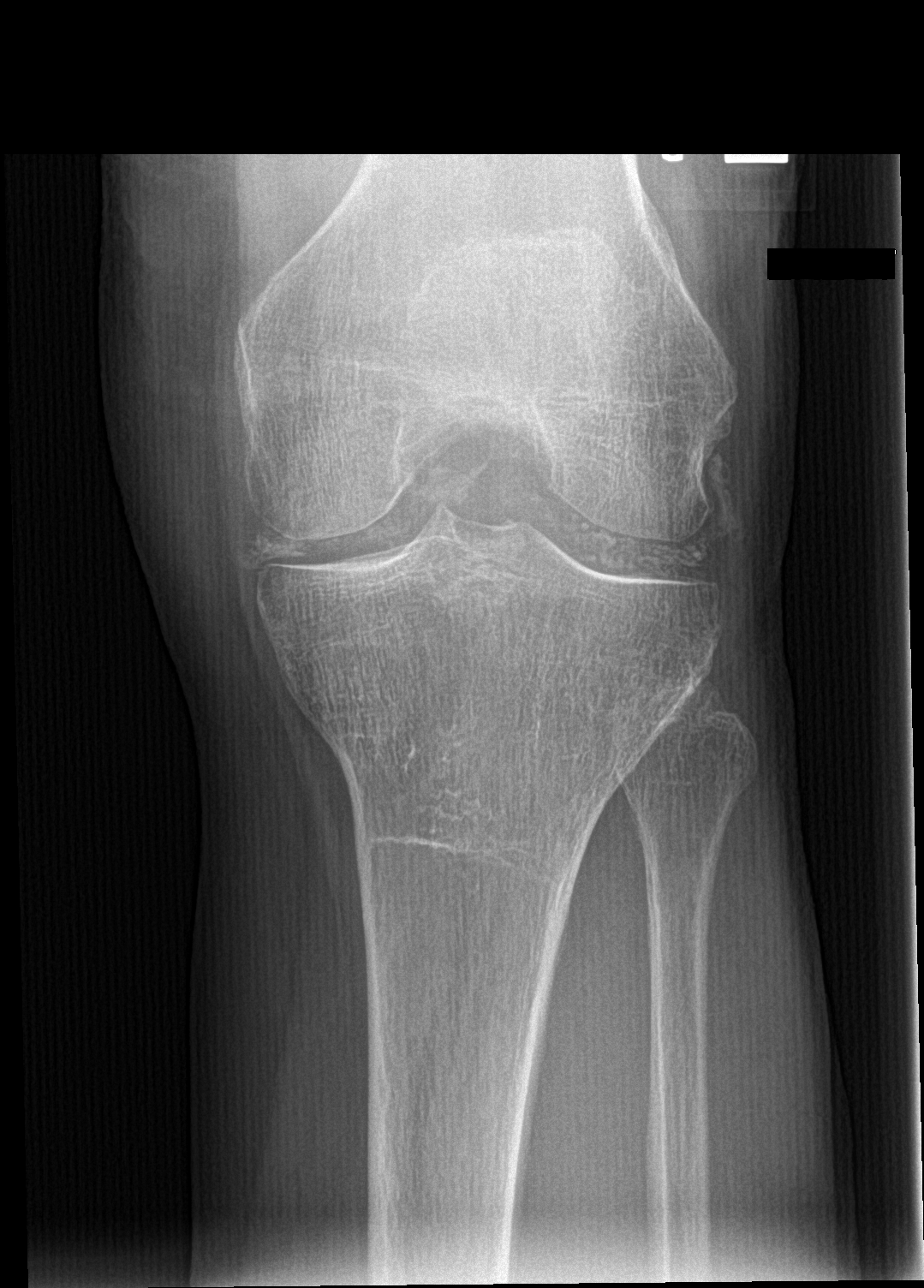

[Series 4: patella skyline · 0.14mm/px · 2 of 2 slices shown]
[im 1/2]
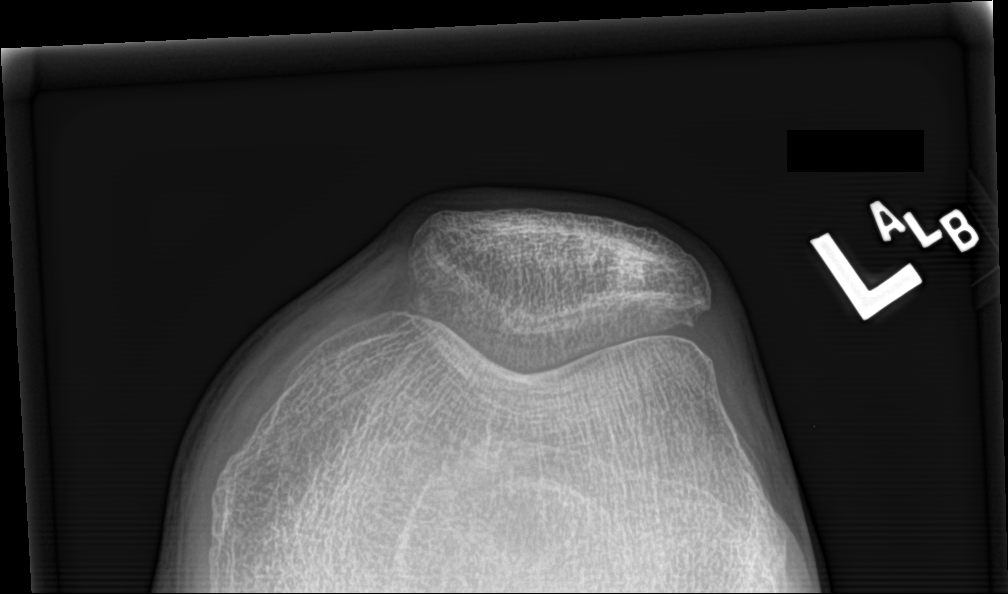
[im 2/2]
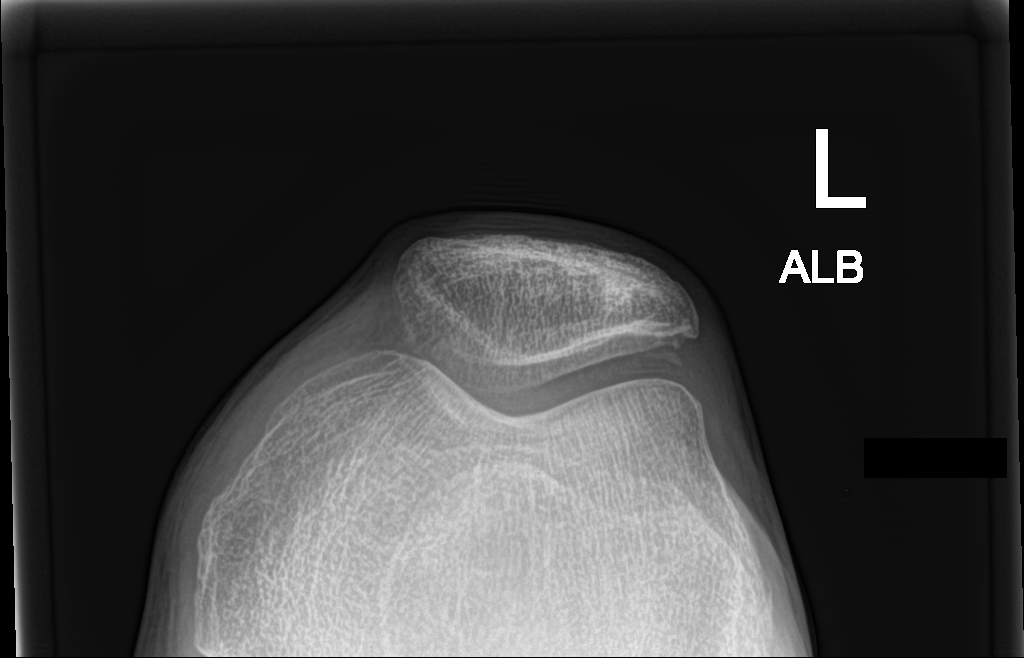

[5 of 5 positions shown; findings below may reference images not displayed]

FINDINGS: No evidence of fracture, dislocation, or joint effusion. No
significant joint space narrowing is noted. Chondrocalcinosis is
noted medially and laterally. Soft tissues are unremarkable.
IMPRESSION: Chondrocalcinosis is noted which may represent calcium pyrophosphate
deposition disease or possibly early degenerative change. No acute
abnormality seen in the left knee.

## 2019-01-08 ENCOUNTER — Encounter: Payer: Self-pay | Admitting: Internal Medicine

## 2019-01-08 ENCOUNTER — Ambulatory Visit (INDEPENDENT_AMBULATORY_CARE_PROVIDER_SITE_OTHER): Payer: Medicare Other | Admitting: Internal Medicine

## 2019-01-08 DIAGNOSIS — Z720 Tobacco use: Secondary | ICD-10-CM | POA: Diagnosis not present

## 2019-01-08 DIAGNOSIS — O99612 Diseases of the digestive system complicating pregnancy, second trimester: Secondary | ICD-10-CM

## 2019-01-08 DIAGNOSIS — R636 Underweight: Secondary | ICD-10-CM

## 2019-01-08 DIAGNOSIS — F5105 Insomnia due to other mental disorder: Secondary | ICD-10-CM | POA: Diagnosis not present

## 2019-01-08 DIAGNOSIS — M818 Other osteoporosis without current pathological fracture: Secondary | ICD-10-CM | POA: Diagnosis not present

## 2019-01-08 DIAGNOSIS — K5900 Constipation, unspecified: Secondary | ICD-10-CM

## 2019-01-08 DIAGNOSIS — T380X5A Adverse effect of glucocorticoids and synthetic analogues, initial encounter: Secondary | ICD-10-CM | POA: Diagnosis not present

## 2019-01-08 DIAGNOSIS — F409 Phobic anxiety disorder, unspecified: Secondary | ICD-10-CM

## 2019-01-08 MED ORDER — VARENICLINE TARTRATE 0.5 MG X 11 & 1 MG X 42 PO MISC: ORAL | 0 refills | Status: DC

## 2019-01-08 MED ORDER — CLONAZEPAM 0.5 MG PO TABS: ORAL_TABLET | ORAL | 5 refills | Status: DC

## 2019-01-08 MED ORDER — VARENICLINE TARTRATE 1 MG PO TABS: 1.0000 mg | ORAL_TABLET | Freq: Two times a day (BID) | ORAL | 3 refills | Status: DC

## 2019-01-08 NOTE — Patient Instructions (Addendum)
You need 60 ounces of non caffeinated beverages  daily as A MINIMUM TO ALLOW BOWEL TO FUNCTION   You can take 400 mg magnesium and 200 mg stool softener daily safely.  Save the stimulatnt laxatives to once weekly   Glycerin suppositories are safe to use daily     Varenicline oral tablets What is this medicine? VARENICLINE (var EN i kleen) is used to help people quit smoking. It is used with a patient support program recommended by your physician. This medicine may be used for other purposes; ask your health care provider or pharmacist if you have questions. COMMON BRAND NAME(S): Chantix What should I tell my health care provider before I take this medicine? They need to know if you have any of these conditions: -heart disease -if you often drink alcohol -kidney disease -mental illness -on hemodialysis -seizures -history of stroke -suicidal thoughts, plans, or attempt; a previous suicide attempt by you or a family member -an unusual or allergic reaction to varenicline, other medicines, foods, dyes, or preservatives -pregnant or trying to get pregnant -breast-feeding How should I use this medicine? Take this medicine by mouth after eating. Take with a full glass of water. Follow the directions on the prescription label. Take your doses at regular intervals. Do not take your medicine more often than directed. There are 3 ways you can use this medicine to help you quit smoking; talk to your health care professional to decide which plan is right for you: 1) you can choose a quit date and start this medicine 1 week before the quit date, or, 2) you can start taking this medicine before you choose a quit date, and then pick a quit date between day 8 and 35 days of treatment, or, 3) if you are not sure that you are able or willing to quit smoking right away, start taking this medicine and slowly decrease the amount you smoke as directed by your health care professional with the goal of being  cigarette-free by week 12 of treatment. Stick to your plan; ask about support groups or other ways to help you remain cigarette-free. If you are motivated to quit smoking and did not succeed during a previous attempt with this medicine for reasons other than side effects, or if you returned to smoking after this treatment, speak with your health care professional about whether another course of this medicine may be right for you. A special MedGuide will be given to you by the pharmacist with each prescription and refill. Be sure to read this information carefully each time. Talk to your pediatrician regarding the use of this medicine in children. This medicine is not approved for use in children. Overdosage: If you think you have taken too much of this medicine contact a poison control center or emergency room at once. NOTE: This medicine is only for you. Do not share this medicine with others. What if I miss a dose? If you miss a dose, take it as soon as you can. If it is almost time for your next dose, take only that dose. Do not take double or extra doses. What may interact with this medicine? -alcohol -insulin -other medicines used to help people quit smoking -theophylline -warfarin This list may not describe all possible interactions. Give your health care provider a list of all the medicines, herbs, non-prescription drugs, or dietary supplements you use. Also tell them if you smoke, drink alcohol, or use illegal drugs. Some items may interact with your medicine. What should  I watch for while using this medicine? It is okay if you do not succeed at your attempt to quit and have a cigarette. You can still continue your quit attempt and keep using this medicine as directed. Just throw away your cigarettes and get back to your quit plan. Talk to your health care provider before using other treatments to quit smoking. Using this medicine with other treatments to quit smoking may increase the risk  for side effects compared to using a treatment alone. You may get drowsy or dizzy. Do not drive, use machinery, or do anything that needs mental alertness until you know how this medicine affects you. Do not stand or sit up quickly, especially if you are an older patient. This reduces the risk of dizzy or fainting spells. Decrease the number of alcoholic beverages that you drink during treatment with this medicine until you know if this medicine affects your ability to tolerate alcohol. Some people have experienced increased drunkenness (intoxication), unusual or sometimes aggressive behavior, or no memory of things that have happened (amnesia) during treatment with this medicine. Sleepwalking can happen during treatment with this medicine, and can sometimes lead to behavior that is harmful to you, other people, or property. Stop taking this medicine and tell your doctor if you start sleepwalking or have other unusual sleep-related activity. After taking this medicine, you may get up out of bed and do an activity that you do not know you are doing. The next morning, you may have no memory of this. Activities include driving a car ("sleep-driving"), making and eating food, talking on the phone, sexual activity, and sleep-walking. Serious injuries have occurred. Stop the medicine and call your doctor right away if you find out you have done any of these activities. Do not take this medicine if you have used alcohol that evening. Do not take it if you have taken another medicine for sleep. The risk of doing these sleep-related activities is higher. Patients and their families should watch out for new or worsening depression or thoughts of suicide. Also watch out for sudden changes in feelings such as feeling anxious, agitated, panicky, irritable, hostile, aggressive, impulsive, severely restless, overly excited and hyperactive, or not being able to sleep. If this happens, call your health care professional. If  you have diabetes and you quit smoking, the effects of insulin may be increased and you may need to reduce your insulin dose. Check with your doctor or health care professional about how you should adjust your insulin dose. What side effects may I notice from receiving this medicine? Side effects that you should report to your doctor or health care professional as soon as possible: -allergic reactions like skin rash, itching or hives, swelling of the face, lips, tongue, or throat -acting aggressive, being angry or violent, or acting on dangerous impulses -breathing problems -changes in emotions or moods -chest pain or chest tightness -feeling faint or lightheaded, falls -hallucination, loss of contact with reality -mouth sores -redness, blistering, peeling or loosening of the skin, including inside the mouth -signs and symptoms of a stroke like changes in vision; confusion; trouble speaking or understanding; severe headaches; sudden numbness or weakness of the face, arm or leg; trouble walking; dizziness; loss of balance or coordination -seizures -sleepwalking -suicidal thoughts or other mood changes Side effects that usually do not require medical attention (report to your doctor or health care professional if they continue or are bothersome): -constipation -gas -headache -nausea, vomiting -strange dreams -trouble sleeping This list may  not describe all possible side effects. Call your doctor for medical advice about side effects. You may report side effects to FDA at 1-800-FDA-1088. Where should I keep my medicine? Keep out of the reach of children. Store at room temperature between 15 and 30 degrees C (59 and 86 degrees F). Throw away any unused medicine after the expiration date. NOTE: This sheet is a summary. It may not cover all possible information. If you have questions about this medicine, talk to your doctor, pharmacist, or health care provider.  2019 Elsevier/Gold Standard  (2018-04-24 12:48:08)

## 2019-01-08 NOTE — Progress Notes (Signed)
Subjective:  Patient ID: Tamara [name], female    DOB: Feb 01, 1946  Age: 73 y.o. MRN: [id number]  CC: Diagnoses of Underweight, Steroid-induced osteoporosis, Insomnia due to anxiety and fear, Constipation during pregnancy in second trimester, and Tobacco abuse were pertinent to this visit.  HPI MILLIE FORDE presents for 6 month follow up on GAD managed with clonazepam and tobacco abuse with interest in quitting with Chantix  Stressed out ; Has 22   59 week old Shitzu puppies (from her adults breeded)   Recent abdominal surgery  July 2019 by Rondall Allegra Westcott: had  Nissen fundoplication takedown.   Was on TPN for several weeks,    g tube removed in July , noted dysphagia for barbecue/meats during Oct 2019 follow  up with surgeon, advised to quit smoking.  Constipation: Feels compelled to have a BM daily and  Has concerns about a "blockage" (see phone note to [facility] Nov 30 2018) . Has chronic abdominal  pain secondary to Crohn's but denies any episodes of nausea and vomiting.    GAD: historically managed with clonazepam The risks and benefits of  Chronic  benzodiazepine use were discussed with patient today including increased risk of dementia,  Addiction, and seizures if abruptly withdrawn  .  Patient was encouraged to reduce use of clonazepam gradually,  Starting with reduction of one dose to 1/2 tablet and consider resuming sertraline   .   Sore throat for about a week.  Coughing more than usual, sinus congestion without drainage. No fevers or headaches . No wheezing.  Wants to quit smoking ,  Wants to resume Chantx  Receiving Prolia for osteoporosis by [facility]ologist started after Nov 2019 DEXA showed worsening T scores .   Prior therapy with Reclast 2010 to 2013. Calcium and D recommended.    Outpatient Medications Prior to Visit  Medication Sig Dispense Refill  . nortriptyline (PAMELOR) 25 MG capsule TAKE 1 CAPSULE BY MOUTH AT BEDTIME. 90 capsule 1  . omeprazole  (PRILOSEC) 40 MG capsule TAKE 1 CAPSULE BY MOUTH TWICE DAILY 60 capsule 2  . sucralfate (CARAFATE) 1 g tablet Take 1 tablet (1 g total) by mouth 4 (four) times daily. Dissolve in water and take before meals and at bedtime. 120 tablet 1  . traZODone (DESYREL) 100 MG tablet Take 2 tablets (200 mg total) by mouth at bedtime. 180 tablet 1  . clonazePAM (KLONOPIN) 0.5 MG tablet TAKE 1 TABLET BY MOUTH ONCE DAILY AS NEEDED ANXIETY 30 tablet 0  . sertraline (ZOLOFT) 100 MG tablet Take 1 tablet (100 mg total) by mouth daily. 30 tablet 5  . varenicline (CHANTIX CONTINUING MONTH PAK) 1 MG tablet Take 1 tablet (1 mg total) by mouth 2 (two) times daily. (Patient not taking: Reported on 01/08/2019) 60 tablet 3  . acetaminophen-codeine (TYLENOL #3) 300-30 MG tablet Take 1 tablet by mouth every 8 (eight) hours as needed for moderate pain. (Patient not taking: Reported on 01/08/2019) 10 tablet 0  . cyclobenzaprine (FLEXERIL) 5 MG tablet Take 1 tablet (5 mg total) by mouth 3 (three) times daily as needed for muscle spasms. (Patient not taking: Reported on 01/08/2019) 30 tablet 1  . methylPREDNISolone (MEDROL DOSEPAK) 4 MG TBPK tablet Take according to pack instructions (Patient not taking: Reported on 01/08/2019) 21 tablet 0  . omeprazole (PRILOSEC) 20 MG capsule Take 20 mg by mouth daily.    . sertraline (ZOLOFT) 100 MG tablet TAKE 1 TABLET BY MOUTH ONCE DAILY. (Patient not taking:  Reported on 01/08/2019) 90 tablet 1  . varenicline (CHANTIX STARTING MONTH PAK) 0.5 MG X 11 & 1 MG X 42 tablet Take one 0.5 mg tablet by mouth once daily for 3 days, then increase to one 0.5 mg tablet twice daily for 4 days, then increase to one 1 mg tablet twice daily. (Patient not taking: Reported on 01/08/2019) 53 tablet 0   No facility-administered medications prior to visit.     Review of Systems;  Patient denies headache, fevers, malaise, unintentional weight loss, skin rash, eye pain, sinus congestion and sinus pain, sore throat,  dysphagia,  hemoptysis , cough, dyspnea, wheezing, chest pain, palpitations, orthopnea, edema, abdominal pain, nausea, melena, diarrhea, constipation, flank pain, dysuria, hematuria, urinary  Frequency, nocturia, numbness, tingling, seizures,  Focal weakness, Loss of consciousness,  Tremor, insomnia, depression, anxiety, and suicidal ideation.      Objective:  BP (!) 102/58 (BP Location: Left Arm, Patient Position: Sitting, Cuff Size: Normal)   Pulse 96   Temp 97.7 F (36.5 C) (Oral)   Resp 15   Ht 4' 11"  (1.499 m)   Wt 104 lb 3.2 oz (47.3 kg)   SpO2 99%   BMI 21.05 kg/m   BP Readings from Last 3 Encounters:  01/08/19 (!) 102/58  08/11/18 126/68  07/08/18 116/68    Wt Readings from Last 3 Encounters:  01/08/19 104 lb 3.2 oz (47.3 kg)  08/11/18 102 lb 9.6 oz (46.5 kg)  07/08/18 101 lb 6.4 oz (46 kg)    General appearance: alert, cooperative and appears stated age Ears: normal TM's and external ear canals both ears Throat: lips, mucosa, and tongue normal; teeth and gums normal Neck: no adenopathy, no carotid bruit, supple, symmetrical, trachea midline and thyroid not enlarged, symmetric, no tenderness/mass/nodules Back: symmetric, no curvature. ROM normal. No CVA tenderness. Lungs: clear to auscultation bilaterally Heart: regular rate and rhythm, S1, S2 normal, no murmur, click, rub or gallop Abdomen: soft, non-tender; bowel sounds normal; no masses,  no organomegaly Pulses: 2+ and symmetric Skin: Skin color, texture, turgor normal. No rashes or lesions Lymph nodes: Cervical, supraclavicular, and axillary nodes normal.  No results found for: HGBA1C  Lab Results  Component Value Date   CREATININE 0.75 07/08/2018   CREATININE 0.73 04/06/2018   CREATININE 0.89 12/02/2017    Lab Results  Component Value Date   WBC 10.3 04/06/2018   HGB 12.9 04/06/2018   HCT 40.3 04/06/2018   PLT 259 04/06/2018   GLUCOSE 85 07/08/2018   CHOL 189 06/11/2017   TRIG 58 04/06/2018   HDL  67.20 06/11/2017   LDLDIRECT 121.1 01/26/2014   LDLCALC 87 06/11/2017   ALT 19 07/08/2018   AST 24 07/08/2018   NA 139 07/08/2018   K 4.3 07/08/2018   CL 107 07/08/2018   CREATININE 0.75 07/08/2018   BUN 14 07/08/2018   CO2 23 07/08/2018   TSH 1.74 06/11/2017   INR 0.85 12/18/2016    No results found.  Assessment & Plan:   Problem List Items Addressed This Visit    Underweight    Chronic, with slight improvement following resolution of dysphagia  with Nissen funduplication takedown         Tobacco abuse    Starting chantix per patient request.       Steroid-induced osteoporosis    Secondary to Crohn's disease and use of steroids.  She has been lost to follow up with Dr . Jefm Bryant for Reclast.  Last DEXA Nov 2019,  T scores worsening. Marland Kitchen  Has been referred to Endocrinology  at [facility]  and now receiving Prolia injections.         Insomnia due to anxiety and fear    Chronic,  The risks of long term benzodiazepine use were discussed with patient today including dementia, excessive sedation leading to respiratory depression,  impaired thinking/driving, and addiction.  Patient was previously taking clonazepam twice daily and developed agitation/withdrawal when she ran out due to loss to follow up.  She has reduced use to once daily    Advised to continue using trazodone for insomnia      Constipation during pregnancy in second trimester    Advised to stop using stimulant laxatives daily.  encouraged to increase water and fiber intake  Magnesium and stool softener          I have discontinued Pierrette R. Moosman's sertraline, cyclobenzaprine, methylPREDNISolone, acetaminophen-codeine, and sertraline. I am also having her start on varenicline. Additionally, I am having her maintain her sucralfate, traZODone, varenicline, omeprazole, nortriptyline, varenicline, and clonazePAM.  Meds ordered this encounter  Medications  . varenicline (CHANTIX STARTING MONTH PAK) 0.5 MG X 11 & 1 MG X  42 tablet    Sig: Take one 0.5 mg tablet by mouth once daily for 3 days, then increase to one 0.5 mg tablet twice daily for 4 days, then increase to one 1 mg tablet twice daily.    Dispense:  53 tablet    Refill:  0  . varenicline (CHANTIX CONTINUING MONTH PAK) 1 MG tablet    Sig: Take 1 tablet (1 mg total) by mouth 2 (two) times daily.    Dispense:  30 tablet    Refill:  3  . clonazePAM (KLONOPIN) 0.5 MG tablet    Sig: TAKE 1 TABLET BY MOUTH ONCE DAILY AS NEEDED ANXIETY    Dispense:  30 tablet    Refill:  5   A total of 40 minutes of face to face time was spent with patient more than half of which was spent in counselling and coordination of care   Medications Discontinued During This Encounter  Medication Reason  . acetaminophen-codeine (TYLENOL #3) 300-30 MG tablet Patient has not taken in last 30 days  . cyclobenzaprine (FLEXERIL) 5 MG tablet Patient has not taken in last 30 days  . methylPREDNISolone (MEDROL DOSEPAK) 4 MG TBPK tablet Completed Course  . omeprazole (PRILOSEC) 20 MG capsule Duplicate  . sertraline (ZOLOFT) 026 MG tablet Duplicate  . varenicline (CHANTIX STARTING MONTH PAK) 0.5 MG X 11 & 1 MG X 42 tablet Reorder  . clonazePAM (KLONOPIN) 0.5 MG tablet Reorder  . sertraline (ZOLOFT) 100 MG tablet     Follow-up: Return in about 6 months (around 07/09/2019).   Crecencio Mc, MD

## 2019-01-10 DIAGNOSIS — K5900 Constipation, unspecified: Secondary | ICD-10-CM | POA: Insufficient documentation

## 2019-01-10 DIAGNOSIS — O99612 Diseases of the digestive system complicating pregnancy, second trimester: Secondary | ICD-10-CM | POA: Insufficient documentation

## 2019-01-10 NOTE — Assessment & Plan Note (Addendum)
Chronic, with slight improvement following resolution of dysphagia  with Nissen funduplication takedown

## 2019-01-10 NOTE — Assessment & Plan Note (Signed)
Advised to stop using stimulant laxatives daily.  encouraged to increase water and fiber intake  Magnesium and stool softener

## 2019-01-10 NOTE — Assessment & Plan Note (Signed)
Chronic,  The risks of long term benzodiazepine use were discussed with patient today including dementia, excessive sedation leading to respiratory depression,  impaired thinking/driving, and addiction.  Patient was previously taking clonazepam twice daily and developed agitation/withdrawal when she ran out due to loss to follow up.  She has reduced use to once daily    Advised to continue using trazodone for insomnia

## 2019-01-10 NOTE — Assessment & Plan Note (Signed)
Secondary to Crohn's disease and use of steroids.  She has been lost to follow up with Dr . Jefm Bryant for Reclast.  Last DEXA Nov 2019,  T scores worsening. Marland Kitchen  Has been referred to Endocrinology  at [facility]  and now receiving Prolia injections.

## 2019-01-10 NOTE — Assessment & Plan Note (Signed)
Starting chantix per patient request.

## 2019-02-12 DIAGNOSIS — M810 Age-related osteoporosis without current pathological fracture: Secondary | ICD-10-CM | POA: Diagnosis not present

## 2019-02-12 DIAGNOSIS — E559 Vitamin D deficiency, unspecified: Secondary | ICD-10-CM | POA: Diagnosis not present

## 2019-02-23 DIAGNOSIS — J0100 Acute maxillary sinusitis, unspecified: Secondary | ICD-10-CM | POA: Diagnosis not present

## 2019-03-08 ENCOUNTER — Other Ambulatory Visit: Payer: Self-pay | Admitting: Internal Medicine

## 2019-03-08 NOTE — Telephone Encounter (Signed)
Not on pt's current medication list must call pt are they still on this medication?

## 2019-03-08 NOTE — Telephone Encounter (Signed)
Please call and see if pt is completely out.  If has, then can forward to Dr Derrel Nip for tomorrow for refill (since was taken off med list).  Thanks

## 2019-03-21 DIAGNOSIS — J029 Acute pharyngitis, unspecified: Secondary | ICD-10-CM | POA: Diagnosis not present

## 2019-03-21 DIAGNOSIS — J0110 Acute frontal sinusitis, unspecified: Secondary | ICD-10-CM | POA: Diagnosis not present

## 2019-04-01 ENCOUNTER — Encounter: Payer: Self-pay | Admitting: Internal Medicine

## 2019-04-01 ENCOUNTER — Ambulatory Visit (INDEPENDENT_AMBULATORY_CARE_PROVIDER_SITE_OTHER): Payer: Medicare Other | Admitting: Internal Medicine

## 2019-04-01 ENCOUNTER — Other Ambulatory Visit: Payer: Self-pay

## 2019-04-01 DIAGNOSIS — J0111 Acute recurrent frontal sinusitis: Secondary | ICD-10-CM | POA: Diagnosis not present

## 2019-04-01 DIAGNOSIS — J029 Acute pharyngitis, unspecified: Secondary | ICD-10-CM | POA: Diagnosis not present

## 2019-04-01 DIAGNOSIS — R438 Other disturbances of smell and taste: Secondary | ICD-10-CM

## 2019-04-01 MED ORDER — AZITHROMYCIN 250 MG PO TABS: ORAL_TABLET | ORAL | 0 refills | Status: DC

## 2019-04-01 NOTE — Progress Notes (Signed)
Virtual Visit via Video Note  I connected with Tamara [name]  on 04/01/19 at 10:25 AM EDT by a video enabled telemedicine application and verified that I am speaking with the correct person using two identifiers.  Location patient: home Location provider:work  Persons participating in the virtual visit: patient, provider  I discussed the limitations of evaluation and management by telemedicine and the availability of in person appointments. The patient expressed understanding and agreed to proceed.   HPI: 1. X 2 weeks c/o scratchy throat and feels swollen in the back of her throat, frontal sinus pressure, bitter taste in mouth, throat is dry. She denies h/o environmental allergies that she knows of. She reports 3-4 months ago similar sx's and was on Augmentin which helped but she ran out (this was from another doctor).  She has tried flonase and claritin and Advil sinus and cold but she is out  Of note reviewed CT head from 08/26/2016 and negative sinus disease   Of note 04/2018 she had Nissen at [facility] for GERD denies GERD and she is still taking prilosec 40 mg daily    ROS: See pertinent positives and negatives per HPI. Denies fever  Past Medical History:  Diagnosis Date  . Bowel obstruction ([place])    s/p laparotomy/ LOA 3/08, prior reversal of jejunal bypasss 2004  . Chronic abdominal pain   . COPD (chronic obstructive pulmonary disease) ([place])   . Crohn's disease ([place])   . Depression   . History of DVT (deep vein thrombosis) 01/2007   post operative right leg, s/p vena cava filter  . Osteoporosis     Past Surgical History:  Procedure Laterality Date  . ABDOMINAL HYSTERECTOMY    . APPENDECTOMY    . CATARACT EXTRACTION    . CERVICAL FUSION  2005   C6-7, anterior approach  . CHOLECYSTECTOMY    . COLONOSCOPY  2015  . ESOPHAGEAL MANOMETRY N/A 02/11/2018   Procedure: ESOPHAGEAL MANOMETRY (EM);  Surgeon: Lucilla Lame, MD;  Location: [facility] ENDOSCOPY;  Service: Endoscopy;   Laterality: N/A;  . ESOPHAGOGASTRODUODENOSCOPY (EGD) WITH PROPOFOL N/A 12/17/2017   Procedure: ESOPHAGOGASTRODUODENOSCOPY (EGD) WITH PROPOFOL;  Surgeon: Robert Bellow, MD;  Location: [facility] ENDOSCOPY;  Service: Endoscopy;  Laterality: N/A;  . ESOPHAGOGASTRODUODENOSCOPY (EGD) WITH PROPOFOL N/A 01/08/2018   Procedure: ESOPHAGOGASTRODUODENOSCOPY (EGD) WITH PROPOFOL;  Surgeon: Robert Bellow, MD;  Location: [facility] ENDOSCOPY;  Service: Endoscopy;  Laterality: N/A;  . gyn surgery     hysterectomy  . LAPAROTOMY  01/30/07   for bowel obstruction with LOA  . LUMBAR LAMINECTOMY/DECOMPRESSION MICRODISCECTOMY N/A 12/25/2016   Procedure: LUMBAR DECOMPRESSION L4-5;  Surgeon: Meade Maw, MD;  Location: [facility] ORS;  Service: Neurosurgery;  Laterality: N/A;  . NISSEN FUNDOPLICATION    . ROTATOR CUFF REPAIR     right  . SPLENECTOMY     secondary to colonoscopy prep  . TONSILLECTOMY      Family History  Problem Relation Age of Onset  . Diabetes Mother        type 2  . Heart disease Mother   . Hypertension Mother   . Coronary artery disease Father     SOCIAL HX: lives at home   Current Outpatient Medications:  .  clonazePAM (KLONOPIN) 0.5 MG tablet, TAKE 1 TABLET BY MOUTH ONCE DAILY AS NEEDED ANXIETY, Disp: 30 tablet, Rfl: 5 .  nortriptyline (PAMELOR) 25 MG capsule, TAKE 1 CAPSULE BY MOUTH AT BEDTIME., Disp: 90 capsule, Rfl: 1 .  omeprazole (PRILOSEC) 40 MG capsule, TAKE  1 CAPSULE BY MOUTH TWICE DAILY, Disp: 60 capsule, Rfl: 2 .  sertraline (ZOLOFT) 100 MG tablet, TAKE 1 TABLET BY MOUTH ONCE DAILY, Disp: 90 tablet, Rfl: 1 .  traZODone (DESYREL) 100 MG tablet, Take 2 tablets (200 mg total) by mouth at bedtime., Disp: 180 tablet, Rfl: 1 .  varenicline (CHANTIX CONTINUING MONTH PAK) 1 MG tablet, Take 1 tablet (1 mg total) by mouth 2 (two) times daily., Disp: 60 tablet, Rfl: 3 .  varenicline (CHANTIX CONTINUING MONTH PAK) 1 MG tablet, Take 1 tablet (1 mg total) by mouth 2 (two) times daily., Disp:  30 tablet, Rfl: 3 .  varenicline (CHANTIX STARTING MONTH PAK) 0.5 MG X 11 & 1 MG X 42 tablet, Take one 0.5 mg tablet by mouth once daily for 3 days, then increase to one 0.5 mg tablet twice daily for 4 days, then increase to one 1 mg tablet twice daily., Disp: 53 tablet, Rfl: 0 .  azithromycin (ZITHROMAX) 250 MG tablet, 2 pills day 1 and 1 pill day 2-5, Disp: 6 tablet, Rfl: 0 .  sucralfate (CARAFATE) 1 g tablet, Take 1 tablet (1 g total) by mouth 4 (four) times daily. Dissolve in water and take before meals and at bedtime., Disp: 120 tablet, Rfl: 1  EXAM:  VITALS per patient if applicable:  GENERAL: alert, oriented, appears well and in no acute distress  HEENT: atraumatic, conjunttiva clear, no obvious abnormalities on inspection of external nose and ears  -throat visualized but hard to see on the camera no obvious swelling/redness/exudate   NECK: normal movements of the head and neck  LUNGS: on inspection no signs of respiratory distress, breathing rate appears normal, no obvious gross SOB, gasping or wheezing  CV: no obvious cyanosis  MS: moves all visible extremities without noticeable abnormality  PSYCH/NEURO: pleasant and cooperative, no obvious depression or anxiety, speech and thought processing grossly intact  ASSESSMENT AND PLAN:  Discussed the following assessment and plan:  Acute recurrent frontal sinusitis/sore throat - Plan: azithromycin (ZITHROMAX) 250 MG tablet 2 pills today and 1 pill day 2-5  NS, Flonase, prn tylenol stop advil cold and sinus, rec warm salt water gargles  She will take claritin otc prn as well  -if sx's continue to be recurrent rec ENT consult in the future  -she will my chart next week if not better   Bitter taste could be related to above though she does have h/o GERD on ppi s/p Nissen 04/2018 so GERD less likely cause of bitter taste s/p nissen      I discussed the assessment and treatment plan with the patient. The patient was provided an  opportunity to ask questions and all were answered. The patient agreed with the plan and demonstrated an understanding of the instructions.   The patient was advised to call back or seek an in-person evaluation if the symptoms worsen or if the condition fails to improve as anticipated.  Time spent 15 minutes Delorise Jackson, MD

## 2019-04-01 NOTE — Progress Notes (Signed)
Pt is c/o dry cough or productive cough, itchy throat, irritating with dee p breathes, can't taste anything everything is bitter, sinus congestion.   Started 2 weeks ago. Pt was taking alevie sign and cold but nothing as of now.  No fever.

## 2019-04-01 NOTE — Telephone Encounter (Signed)
Scheduled with Dr. Aundra Dubin for virtual FYI. Today.

## 2019-04-08 ENCOUNTER — Other Ambulatory Visit: Payer: Self-pay | Admitting: Internal Medicine

## 2019-04-08 DIAGNOSIS — R432 Parageusia: Secondary | ICD-10-CM

## 2019-04-08 DIAGNOSIS — K219 Gastro-esophageal reflux disease without esophagitis: Secondary | ICD-10-CM

## 2019-04-08 DIAGNOSIS — J321 Chronic frontal sinusitis: Secondary | ICD-10-CM

## 2019-04-09 ENCOUNTER — Ambulatory Visit (INDEPENDENT_AMBULATORY_CARE_PROVIDER_SITE_OTHER): Payer: Medicare Other | Admitting: Internal Medicine

## 2019-04-09 ENCOUNTER — Other Ambulatory Visit: Payer: Self-pay

## 2019-04-09 DIAGNOSIS — R42 Dizziness and giddiness: Secondary | ICD-10-CM | POA: Diagnosis not present

## 2019-04-09 DIAGNOSIS — R51 Headache: Secondary | ICD-10-CM | POA: Diagnosis not present

## 2019-04-09 DIAGNOSIS — J029 Acute pharyngitis, unspecified: Secondary | ICD-10-CM

## 2019-04-09 DIAGNOSIS — R05 Cough: Secondary | ICD-10-CM

## 2019-04-09 DIAGNOSIS — R519 Headache, unspecified: Secondary | ICD-10-CM

## 2019-04-09 DIAGNOSIS — K219 Gastro-esophageal reflux disease without esophagitis: Secondary | ICD-10-CM | POA: Diagnosis not present

## 2019-04-09 DIAGNOSIS — R059 Cough, unspecified: Secondary | ICD-10-CM

## 2019-04-09 MED ORDER — PANTOPRAZOLE SODIUM 40 MG PO TBEC: 40.0000 mg | DELAYED_RELEASE_TABLET | Freq: Every day | ORAL | 3 refills | Status: DC

## 2019-04-09 NOTE — Progress Notes (Signed)
Virtual Visit via Video Note  I connected with Tamara [name]   on 04/09/19 at  9:33 AM EDT by a video enabled telemedicine application and verified that I am speaking with the correct person using two identifiers.  Location patient: home Location provider:work  Persons participating in the virtual visit: patient, provider  I discussed the limitations of evaluation and management by telemedicine and the availability of in person appointments. The patient expressed understanding and agreed to proceed.   HPI: 1. X 3-4 months c/o frontal sinus pressure and h/a worsening over the last 2 weeks and failed multiple antibiotics and even recent Zpack didn't help. She reports frontal h/a 6-7/10 associated with dizziness no recent h/o migraines.  No fever no body aches.  Left eye one of veins burst after 04/01/19 appt and eye was red but resolved for now   2. Cough and scratchy throat s/p Nissen in summer 2019 [facility] she os on prilosec 40 mg bid long term and no longer on carafate 1 g. She denies nausea or vomiting or dysphagia but does report reflux at times and cough and scratchy throat ongoing x 3-4 months worse x 2 weeks as well. She would like to f/u with Dr. Garner [name] (gen surgery) > Dr. Truddie Crumble (GI) at [facility] as we discussed her sx's may be due to GERD s/p Nissen ni summer 2019   ROS: See pertinent positives and negatives per HPI. General: denies fever, body aches  HEENT: c/o scratchy throat: left eye one of veins burst after 04/01/19 appt and eye was red but resolved for now, denies dysphagia  GI: +GERD, denies n/v  Lungs: +cough  Neuro: +frontal h/a and dizziness worsening  Past Medical History:  Diagnosis Date  . Bowel obstruction ([place])    s/p laparotomy/ LOA 3/08, prior reversal of jejunal bypasss 2004  . Chronic abdominal pain   . COPD (chronic obstructive pulmonary disease) ([place])   . Crohn's disease ([place])   . Depression   . History of DVT (deep vein thrombosis) 01/2007   post  operative right leg, s/p vena cava filter  . Osteoporosis     Past Surgical History:  Procedure Laterality Date  . ABDOMINAL HYSTERECTOMY    . APPENDECTOMY    . CATARACT EXTRACTION    . CERVICAL FUSION  2005   C6-7, anterior approach  . CHOLECYSTECTOMY    . COLONOSCOPY  2015  . ESOPHAGEAL MANOMETRY N/A 02/11/2018   Procedure: ESOPHAGEAL MANOMETRY (EM);  Surgeon: Lucilla Lame, MD;  Location: [facility] END[facility]COPY;  Service: Endoscopy;  Laterality: N/A;  . ESOPHAGOGASTRODUODENOSCOPY (EGD) WITH PROPOFOL N/A 12/17/2017   Procedure: ESOPHAGOGASTRODUODENOSCOPY (EGD) WITH PROPOFOL;  Surgeon: Robert Bellow, MD;  Location: [facility] END[facility];  Service: Endoscopy;  Laterality: N/A;  . ESOPHAGOGASTRODUODENOSCOPY (EGD) WITH PROPOFOL N/A 01/08/2018   Procedure: ESOPHAGOGASTRODUODENOSCOPY (EGD) WITH PROPOFOL;  Surgeon: Robert Bellow, MD;  Location: [facility] ENDOSCOPY;  Service: Endoscopy;  Laterality: N/A;  . gyn surgery     hysterectomy  . LAPAROTOMY  01/30/07   for bowel obstruction with LOA  . LUMBAR LAMINECTOMY/DECOMPRESSION MICRODISCECTOMY N/A 12/25/2016   Procedure: LUMBAR DECOMPRESSION L4-5;  Surgeon: Meade Maw, MD;  Location: [facility] ORS;  Service: Neurosurgery;  Laterality: N/A;  . NISSEN FUNDOPLICATION    . ROTATOR CUFF REPAIR     right  . SPLENECTOMY     secondary to colonoscopy prep  . TONSILLECTOMY      Family History  Problem Relation Age of Onset  . Diabetes Mother  type 2  . Heart disease Mother   . Hypertension Mother   . Coronary artery disease Father     SOCIAL HX: lives at home    Current Outpatient Medications:  .  clonazePAM (KLONOPIN) 0.5 MG tablet, TAKE 1 TABLET BY MOUTH ONCE DAILY AS NEEDED ANXIETY, Disp: 30 tablet, Rfl: 5 .  nortriptyline (PAMELOR) 25 MG capsule, TAKE 1 CAPSULE BY MOUTH AT BEDTIME., Disp: 90 capsule, Rfl: 1 .  sertraline (ZOLOFT) 100 MG tablet, TAKE 1 TABLET BY MOUTH ONCE DAILY, Disp: 90 tablet, Rfl: 1 .  traZODone (DESYREL) 100 MG tablet,  Take 2 tablets (200 mg total) by mouth at bedtime., Disp: 180 tablet, Rfl: 1 .  varenicline (CHANTIX CONTINUING MONTH PAK) 1 MG tablet, Take 1 tablet (1 mg total) by mouth 2 (two) times daily., Disp: 60 tablet, Rfl: 3 .  varenicline (CHANTIX CONTINUING MONTH PAK) 1 MG tablet, Take 1 tablet (1 mg total) by mouth 2 (two) times daily., Disp: 30 tablet, Rfl: 3 .  varenicline (CHANTIX STARTING MONTH PAK) 0.5 MG X 11 & 1 MG X 42 tablet, Take one 0.5 mg tablet by mouth once daily for 3 days, then increase to one 0.5 mg tablet twice daily for 4 days, then increase to one 1 mg tablet twice daily., Disp: 53 tablet, Rfl: 0 .  pantoprazole (PROTONIX) 40 MG tablet, Take 1 tablet (40 mg total) by mouth daily. 30 minutes 1x per day per a meal, Disp: 90 tablet, Rfl: 3  EXAM:  VITALS per patient if applicable:  GENERAL: alert, oriented, appears well and in no acute distress  HEENT: atraumatic, conjunttiva clear, no obvious abnormalities on inspection of external nose and ears  NECK: normal movements of the head and neck  LUNGS: on inspection no signs of respiratory distress, breathing rate appears normal, no obvious gross SOB, gasping or wheezing  CV: no obvious cyanosis  MS: moves all visible extremities without noticeable abnormality  PSYCH/NEURO: pleasant and cooperative, no obvious depression or anxiety, speech and thought processing grossly intact  ASSESSMENT AND PLAN:  Discussed the following assessment and plan:  1. Dizziness - Plan: MR Brain Wo Contrast Intractable headache, nonspecific- Plan: MR Brain Wo Contrast -will r/o central etiology with MRI, vs sinus etiology. Medications have not changed less likely medications though some pts have been reporting h/a with PPI use which is a side effect  -if sx's continue will rec neurology consult to consider   2.Gastroesophageal reflux disease s/p Nissen in 2019 [facility] Sore throat/scratchy throat and cough  Plan:  d/c prilosec 40 mg bid and  change to pantoprazole (PROTONIX) 40 MG tablet qd  -I think this could be related to if pt is still having problems with GERD s/p Nissen  -cancel ENT appt [facility] and refer back to Dr. Garner [name] at [facility] appt sch 04/14/2019 at 11:45 am  Will fax note [phone] she may need another EGD which Dr. Garner [name] is able to do and she prefers not to see Dr. Truddie Crumble      I discussed the assessment and treatment plan with the patient. The patient was provided an opportunity to ask questions and all were answered. The patient agreed with the plan and demonstrated an understanding of the instructions.   The patient was advised to call back or seek an in-person evaluation if the symptoms worsen or if the condition fails to improve as anticipated.  Time spent 25 minutes care plan and coordinating care  Delorise Jackson, MD

## 2019-04-09 NOTE — Patient Instructions (Signed)
Sore Throat A sore throat is pain, burning, irritation, or scratchiness in the throat. When you have a sore throat, you may feel pain or tenderness in your throat when you swallow or talk. Many things can cause a sore throat, including:  An infection.  Seasonal allergies.  Dryness in the air.  Irritants, such as smoke or pollution.  Radiation treatment to the area.  Gastroesophageal reflux disease (GERD).  A tumor. A sore throat is often the first sign of another sickness. It may happen with other symptoms, such as coughing, sneezing, fever, and swollen neck glands. Most sore throats go away without medical treatment. Follow these instructions at home:      Take over-the-counter medicines only as told by your health care provider. ? If your child has a sore throat, do not give your child aspirin because of the association with Reye syndrome.  Drink enough fluids to keep your urine pale yellow.  Rest as needed.  To help with pain, try: ? Sipping warm liquids, such as broth, herbal tea, or warm water. ? Eating or drinking cold or frozen liquids, such as frozen ice pops. ? Gargling with a salt-water mixture 3-4 times a day or as needed. To make a salt-water mixture, completely dissolve -1 tsp (3-6 g) of salt in 1 cup (237 mL) of warm water. ? Sucking on hard candy or throat lozenges. ? Putting a cool-mist humidifier in your bedroom at night to moisten the air. ? Sitting in the bathroom with the door closed for 5-10 minutes while you run hot water in the shower.  Do not use any products that contain nicotine or tobacco, such as cigarettes, e-cigarettes, and chewing tobacco. If you need help quitting, ask your health care provider.  Wash your hands well and often with soap and water. If soap and water are not available, use hand sanitizer. Contact a health care provider if:  You have a fever for more than 2-3 days.  You have symptoms that last (are persistent) for more than  2-3 days.  Your throat does not get better within 7 days.  You have a fever and your symptoms suddenly get worse.  Your child who is 3 months to 36 years old has a temperature of 102.81F (39C) or higher. Get help right away if:  You have difficulty breathing.  You cannot swallow fluids, soft foods, or your saliva.  You have increased swelling in your throat or neck.  You have persistent nausea and vomiting. Summary  A sore throat is pain, burning, irritation, or scratchiness in the throat. Many things can cause a sore throat.  Take over-the-counter medicines only as told by your health care provider. Do not give your child aspirin.  Drink plenty of fluids, and rest as needed.  Contact a health care provider if your symptoms worsen or your sore throat does not get better within 7 days. This information is not intended to replace advice given to you by your health care provider. Make sure you discuss any questions you have with your health care provider. Document Released: 12/05/2004 Document Revised: 03/30/2018 Document Reviewed: 03/30/2018 Elsevier Interactive Patient Education  2019 [employer].

## 2019-04-12 ENCOUNTER — Ambulatory Visit: Payer: Medicare Other

## 2019-04-13 ENCOUNTER — Other Ambulatory Visit: Payer: Self-pay

## 2019-04-13 ENCOUNTER — Ambulatory Visit
Admission: RE | Admit: 2019-04-13 | Discharge: 2019-04-13 | Disposition: A | Discharge status: Home / Self Care | Payer: Medicare Other | Source: Ambulatory Visit | Attending: Internal Medicine | Admitting: Internal Medicine

## 2019-04-13 DIAGNOSIS — R55 Syncope and collapse: Secondary | ICD-10-CM | POA: Diagnosis not present

## 2019-04-13 DIAGNOSIS — R42 Dizziness and giddiness: Secondary | ICD-10-CM | POA: Diagnosis not present

## 2019-04-13 DIAGNOSIS — R519 Headache, unspecified: Secondary | ICD-10-CM

## 2019-04-13 DIAGNOSIS — R51 Headache: Secondary | ICD-10-CM | POA: Diagnosis not present

## 2019-04-14 DIAGNOSIS — R490 Dysphonia: Secondary | ICD-10-CM | POA: Diagnosis not present

## 2019-04-14 DIAGNOSIS — K224 Dyskinesia of esophagus: Secondary | ICD-10-CM | POA: Diagnosis not present

## 2019-04-14 DIAGNOSIS — Z5331 Laparoscopic surgical procedure converted to open procedure: Secondary | ICD-10-CM | POA: Diagnosis not present

## 2019-04-15 ENCOUNTER — Telehealth: Payer: Self-pay | Admitting: *Deleted

## 2019-04-15 NOTE — Telephone Encounter (Signed)
Copied from [place] [id number]. Topic: General - Other >> Apr 15, 2019  7:49 AM Carolyn Stare wrote:   Pt req an appt said she is not getting better and has been to see specialist

## 2019-04-15 NOTE — Telephone Encounter (Signed)
Left message for patient to return call.

## 2019-04-15 NOTE — Telephone Encounter (Signed)
Pt called back. °

## 2019-04-16 NOTE — Telephone Encounter (Signed)
Called patient to setup appointment and get more detail left message to call back no answer.

## 2019-04-16 NOTE — Telephone Encounter (Signed)
Attempted to return call to patient no answer advised on voicemail  I could not just schedule and leave appointment that I needed to speak with her there are screening question that need to be answered.

## 2019-04-16 NOTE — Telephone Encounter (Signed)
Patient called to set up appt.  Patient states if she does not answer please leave VM of date and time. Patient call back # [phone]

## 2019-04-19 ENCOUNTER — Encounter: Payer: Self-pay | Admitting: Internal Medicine

## 2019-04-19 ENCOUNTER — Ambulatory Visit (INDEPENDENT_AMBULATORY_CARE_PROVIDER_SITE_OTHER): Payer: Medicare Other | Admitting: Internal Medicine

## 2019-04-19 ENCOUNTER — Other Ambulatory Visit: Payer: Self-pay

## 2019-04-19 DIAGNOSIS — R51 Headache: Secondary | ICD-10-CM | POA: Diagnosis not present

## 2019-04-19 DIAGNOSIS — R1310 Dysphagia, unspecified: Secondary | ICD-10-CM

## 2019-04-19 DIAGNOSIS — G479 Sleep disorder, unspecified: Secondary | ICD-10-CM

## 2019-04-19 DIAGNOSIS — R519 Headache, unspecified: Secondary | ICD-10-CM

## 2019-04-19 DIAGNOSIS — J449 Chronic obstructive pulmonary disease, unspecified: Secondary | ICD-10-CM

## 2019-04-19 DIAGNOSIS — J392 Other diseases of pharynx: Secondary | ICD-10-CM | POA: Diagnosis not present

## 2019-04-19 DIAGNOSIS — Z720 Tobacco use: Secondary | ICD-10-CM | POA: Diagnosis not present

## 2019-04-19 DIAGNOSIS — R1319 Other dysphagia: Secondary | ICD-10-CM

## 2019-04-19 MED ORDER — VARENICLINE TARTRATE 0.5 MG X 11 & 1 MG X 42 PO MISC: ORAL | 0 refills | Status: DC

## 2019-04-19 NOTE — Telephone Encounter (Signed)
Pt is scheduled for today with Dr. Derrel Nip.

## 2019-04-19 NOTE — Patient Instructions (Signed)
Resume prilsec 40 mg twice daily INSTEAD OF Protonix  Add mylanta or gaviscon trial for sore throat  STOP SMOKING!  chantix rx will be mailed to you  Home sleep study ordered to investigate your morning headaches

## 2019-04-19 NOTE — Progress Notes (Signed)
Order is printed and once note is complete I will fax it to [facility]. Thanks, [employer]

## 2019-04-19 NOTE — Assessment & Plan Note (Signed)
ENT evaluation in progress. Urged to quit smoking

## 2019-04-19 NOTE — Assessment & Plan Note (Signed)
Resolved after last takedown surgery .  She is gaining weight

## 2019-04-19 NOTE — Assessment & Plan Note (Signed)
  Still smoking .  5 minutes was spent  discussing patient's current tobacco abuse,  Prior attempts to quit, the current and future hazards to patient's  health,  And assessing and discussing patient's  level of motivation to quit. Patient was encouraged to consider pharmacotherapy and has agreed to trial of chantix

## 2019-04-19 NOTE — Progress Notes (Signed)
Virtual Visit converted to Telephone  Note  This visit type was conducted due to national recommendations for restrictions regarding the COVID-19 pandemic (e.g. social distancing).  This format is felt to be most appropriate for this patient at this time.  All issues noted in this document were discussed and addressed.  No physical exam was performed (except for noted visual exam findings with Video Visits).   I attempted to connect with@ on 04/19/19 at 11:30 AM EDT   by a video enabled telemedicine application ; however  Interactive audio and video telecommunications  Ultimately failed, due to patient having technical difficulties.   We continued and completed visit with audio only.   I verified that I am speaking with the correct person using two identifiers.by a video enabled telemedicine application or telephone and verified that I am speaking with the correct person using two identifiers. Location patient: home Location provider: work or home office Persons participating in the virtual visit: patient, provider  I discussed the limitations, risks, security and privacy concerns of performing an evaluation and management service by telephone and the availability of in person appointments. I also discussed with the patient that there may be a patient responsible charge related to this service. The patient expressed understanding and agreed to proceed.  Reason for visit: persistent sore throat, headache and cough  HPI:  73 yr old female with long history of tobacco abuse presents with 2 month history of persistent cough, sore throat and headache despite multiple rounds of antibiotics.  She was initially treated with augmentin via  telephone visit with [facility] " in late March after use of antihistamines failed to resolved symptoms of post nasal drip.  Symptoms improved  for two weeks after augmentin trial, but symptoms returned and she was treated by [place] in mid May .with azithromycin.   MRI brain and sinuses was done for persistent symptoms and no sinus disease or cerebral masses were noted.   She was referred to ENT and to her gastroenterologist Dr Garner [name]  continues to have a dry cough that is irritating her throat,  dull headache and sinus pain described as burning .  She underwent open take down of  Nissen funduplication, partial gastrectomy and myotomy in June 2019 secondary to esophageal outlet obstruction  With resolution of dysphagia and resultant welcomed weight gain .  She has seen GI as of last week and was advised to see ENT,  She is scheduled for next week .  She is sill smoking a pack of cigarettes daily  ; she never picked up the Chantix prescription that was sent months ago   ROS: See pertinent positives and negatives per HPI.  Past Medical History:  Diagnosis Date  . Bowel obstruction ([place])    s/p laparotomy/ LOA 3/08, prior reversal of jejunal bypasss 2004  . Chronic abdominal pain   . COPD (chronic obstructive pulmonary disease) ([place])   . Crohn's disease ([place])   . Depression   . History of DVT (deep vein thrombosis) 01/2007   post operative right leg, s/p vena cava filter  . Osteoporosis     Past Surgical History:  Procedure Laterality Date  . ABDOMINAL HYSTERECTOMY    . APPENDECTOMY    . CATARACT EXTRACTION    . CERVICAL FUSION  2005   C6-7, anterior approach  . CHOLECYSTECTOMY    . COLONOSCOPY  2015  . ESOPHAGEAL MANOMETRY N/A 02/11/2018   Procedure: ESOPHAGEAL MANOMETRY (EM);  Surgeon: Lucilla Lame, MD;  Location: [facility]  ENDOSCOPY;  Service: Endoscopy;  Laterality: N/A;  . ESOPHAGOGASTRODUODENOSCOPY (EGD) WITH PROPOFOL N/A 12/17/2017   Procedure: ESOPHAGOGASTRODUODENOSCOPY (EGD) WITH PROPOFOL;  Surgeon: Robert Bellow, MD;  Location: [facility] ENDOSCOPY;  Service: Endoscopy;  Laterality: N/A;  . ESOPHAGOGASTRODUODENOSCOPY (EGD) WITH PROPOFOL N/A 01/08/2018   Procedure: ESOPHAGOGASTRODUODENOSCOPY (EGD) WITH PROPOFOL;  Surgeon: Robert Bellow, MD;   Location: [facility] ENDOSCOPY;  Service: Endoscopy;  Laterality: N/A;  . gyn surgery     hysterectomy  . LAPAROTOMY  01/30/07   for bowel obstruction with LOA  . LUMBAR LAMINECTOMY/DECOMPRESSION MICRODISCECTOMY N/A 12/25/2016   Procedure: LUMBAR DECOMPRESSION L4-5;  Surgeon: Meade Maw, MD;  Location: [facility] ORS;  Service: Neurosurgery;  Laterality: N/A;  . NISSEN FUNDOPLICATION    . ROTATOR CUFF REPAIR     right  . SPLENECTOMY     secondary to colonoscopy prep  . TONSILLECTOMY      Family History  Problem Relation Age of Onset  . Diabetes Mother        type 2  . Heart disease Mother   . Hypertension Mother   . Coronary artery disease Father     SOCIAL HX: widowed, smoker,  IADLs  3 dogs at home.    Current Outpatient Medications:  .  clonazePAM (KLONOPIN) 0.5 MG tablet, TAKE 1 TABLET BY MOUTH ONCE DAILY AS NEEDED ANXIETY, Disp: 30 tablet, Rfl: 5 .  nortriptyline (PAMELOR) 25 MG capsule, TAKE 1 CAPSULE BY MOUTH AT BEDTIME., Disp: 90 capsule, Rfl: 1 .  pantoprazole (PROTONIX) 40 MG tablet, Take 1 tablet (40 mg total) by mouth daily. 30 minutes 1x per day per a meal, Disp: 90 tablet, Rfl: 3 .  sertraline (ZOLOFT) 100 MG tablet, TAKE 1 TABLET BY MOUTH ONCE DAILY, Disp: 90 tablet, Rfl: 1 .  traZODone (DESYREL) 100 MG tablet, Take 2 tablets (200 mg total) by mouth at bedtime., Disp: 180 tablet, Rfl: 1 .  varenicline (CHANTIX STARTING MONTH PAK) 0.5 MG X 11 & 1 MG X 42 tablet, Take one 0.5 mg tablet by mouth once daily for 3 days, then increase to one 0.5 mg tablet twice daily for 4 days, then increase to one 1 mg tablet twice daily., Disp: 53 tablet, Rfl: 0  EXAM:   General impression: alert, cooperative and articulate.  No signs of being in distress  Lungs: speech is fluent sentence length suggests that patient is not short of breath and not punctuated by cough, sneezing or sniffing. Marland Kitchen   Psych: affect normal.  speech is articulate and non pressured .  Denies suicidal thoughts    ASSESSMENT AND PLAN:  Discussed the following assessment and plan:  Morning headache - Plan: Home sleep test  COPD mixed type ([place]) - Plan: Home sleep test  Sleep disorder - Plan: Home sleep test  Pharyngeal irritation  Tobacco abuse  Esophageal dysphagia  Pharyngeal irritation ENT evaluation in progress. Urged to quit smoking   Tobacco abuse  Still smoking .  5 minutes was spent  discussing patient's current tobacco abuse,  Prior attempts to quit, the current and future hazards to patient's  health,  And assessing and discussing patient's  level of motivation to quit. Patient was encouraged to consider pharmacotherapy and has agreed to trial of chantix   Dysphagia Resolved after last takedown surgery .  She is gaining weight     I discussed the assessment and treatment plan with the patient. The patient was provided an opportunity to ask questions and all were answered. The patient agreed with  the plan and demonstrated an understanding of the instructions.   The patient was advised to call back or seek an in-person evaluation if the symptoms worsen or if the condition fails to improve as anticipated.  I provided  25 minutes of non-face-to-face time during this encounter.   Crecencio Mc, MD

## 2019-04-22 DIAGNOSIS — J301 Allergic rhinitis due to pollen: Secondary | ICD-10-CM | POA: Diagnosis not present

## 2019-04-22 DIAGNOSIS — J309 Allergic rhinitis, unspecified: Secondary | ICD-10-CM | POA: Diagnosis not present

## 2019-04-22 DIAGNOSIS — R51 Headache: Secondary | ICD-10-CM | POA: Diagnosis not present

## 2019-04-22 DIAGNOSIS — R070 Pain in throat: Secondary | ICD-10-CM | POA: Diagnosis not present

## 2019-04-23 NOTE — Telephone Encounter (Signed)
I assume she is referring to Chantix prescription,  It was printed on June 8

## 2019-04-27 DIAGNOSIS — J449 Chronic obstructive pulmonary disease, unspecified: Secondary | ICD-10-CM | POA: Diagnosis not present

## 2019-04-27 DIAGNOSIS — R0602 Shortness of breath: Secondary | ICD-10-CM | POA: Diagnosis not present

## 2019-04-27 DIAGNOSIS — G4733 Obstructive sleep apnea (adult) (pediatric): Secondary | ICD-10-CM | POA: Diagnosis not present

## 2019-04-28 DIAGNOSIS — R7989 Other specified abnormal findings of blood chemistry: Secondary | ICD-10-CM | POA: Diagnosis not present

## 2019-04-28 DIAGNOSIS — J449 Chronic obstructive pulmonary disease, unspecified: Secondary | ICD-10-CM | POA: Diagnosis not present

## 2019-04-28 DIAGNOSIS — K219 Gastro-esophageal reflux disease without esophagitis: Secondary | ICD-10-CM | POA: Insufficient documentation

## 2019-04-28 DIAGNOSIS — K5090 Crohn's disease, unspecified, without complications: Secondary | ICD-10-CM | POA: Diagnosis not present

## 2019-04-28 DIAGNOSIS — R0602 Shortness of breath: Secondary | ICD-10-CM | POA: Diagnosis not present

## 2019-04-28 DIAGNOSIS — K50012 Crohn's disease of small intestine with intestinal obstruction: Secondary | ICD-10-CM | POA: Diagnosis not present

## 2019-04-28 DIAGNOSIS — G4733 Obstructive sleep apnea (adult) (pediatric): Secondary | ICD-10-CM | POA: Diagnosis not present

## 2019-04-28 NOTE — Progress Notes (Signed)
Sleep study has been faxed.

## 2019-05-01 DIAGNOSIS — R7989 Other specified abnormal findings of blood chemistry: Secondary | ICD-10-CM | POA: Insufficient documentation

## 2019-05-04 DIAGNOSIS — G43109 Migraine with aura, not intractable, without status migrainosus: Secondary | ICD-10-CM | POA: Diagnosis not present

## 2019-05-06 ENCOUNTER — Other Ambulatory Visit: Payer: Self-pay

## 2019-05-06 ENCOUNTER — Encounter
Admission: RE | Admit: 2019-05-06 | Discharge: 2019-05-06 | Disposition: A | Discharge status: Home / Self Care | Payer: Medicare Other | Source: Ambulatory Visit | Attending: Unknown Physician Specialty | Admitting: Unknown Physician Specialty

## 2019-05-06 DIAGNOSIS — Z1159 Encounter for screening for other viral diseases: Secondary | ICD-10-CM | POA: Diagnosis not present

## 2019-05-07 ENCOUNTER — Encounter: Payer: Self-pay | Admitting: *Deleted

## 2019-05-07 LAB — NOVEL CORONAVIRUS, NAA (HOSP ORDER, SEND-OUT TO REF LAB; TAT 18-24 HRS): SARS-CoV-2, NAA: NOT DETECTED

## 2019-05-08 ENCOUNTER — Other Ambulatory Visit: Payer: Self-pay | Admitting: Internal Medicine

## 2019-05-10 ENCOUNTER — Ambulatory Visit
Admission: RE | Admit: 2019-05-10 | Discharge: 2019-05-10 | Disposition: A | Discharge status: Home / Self Care | Payer: Medicare Other | Attending: Unknown Physician Specialty | Admitting: Unknown Physician Specialty

## 2019-05-10 ENCOUNTER — Ambulatory Visit: Payer: Medicare Other | Admitting: Anesthesiology

## 2019-05-10 ENCOUNTER — Encounter: Payer: Self-pay | Admitting: Anesthesiology

## 2019-05-10 ENCOUNTER — Encounter
Admission: RE | Disposition: A | Discharge status: Home / Self Care | Payer: Self-pay | Source: Home / Self Care | Attending: Unknown Physician Specialty

## 2019-05-10 DIAGNOSIS — K635 Polyp of colon: Secondary | ICD-10-CM | POA: Diagnosis not present

## 2019-05-10 DIAGNOSIS — K5090 Crohn's disease, unspecified, without complications: Secondary | ICD-10-CM | POA: Diagnosis not present

## 2019-05-10 DIAGNOSIS — F17210 Nicotine dependence, cigarettes, uncomplicated: Secondary | ICD-10-CM | POA: Insufficient documentation

## 2019-05-10 DIAGNOSIS — Z1211 Encounter for screening for malignant neoplasm of colon: Secondary | ICD-10-CM | POA: Diagnosis not present

## 2019-05-10 DIAGNOSIS — K621 Rectal polyp: Secondary | ICD-10-CM | POA: Diagnosis not present

## 2019-05-10 DIAGNOSIS — J449 Chronic obstructive pulmonary disease, unspecified: Secondary | ICD-10-CM | POA: Diagnosis not present

## 2019-05-10 DIAGNOSIS — Z980 Intestinal bypass and anastomosis status: Secondary | ICD-10-CM | POA: Diagnosis not present

## 2019-05-10 DIAGNOSIS — D124 Benign neoplasm of descending colon: Secondary | ICD-10-CM | POA: Diagnosis not present

## 2019-05-10 DIAGNOSIS — D126 Benign neoplasm of colon, unspecified: Secondary | ICD-10-CM | POA: Diagnosis not present

## 2019-05-10 DIAGNOSIS — K648 Other hemorrhoids: Secondary | ICD-10-CM | POA: Diagnosis not present

## 2019-05-10 DIAGNOSIS — D128 Benign neoplasm of rectum: Secondary | ICD-10-CM | POA: Diagnosis not present

## 2019-05-10 DIAGNOSIS — K5080 Crohn's disease of both small and large intestine without complications: Secondary | ICD-10-CM | POA: Diagnosis present

## 2019-05-10 DIAGNOSIS — Z8719 Personal history of other diseases of the digestive system: Secondary | ICD-10-CM | POA: Diagnosis not present

## 2019-05-10 HISTORY — DX: Headache, unspecified: R51.9

## 2019-05-10 HISTORY — PX: COLONOSCOPY WITH PROPOFOL: SHX5780

## 2019-05-10 LAB — HM COLONOSCOPY

## 2019-05-10 SURGERY — COLONOSCOPY WITH PROPOFOL
Anesthesia: General

## 2019-05-10 MED ORDER — PROPOFOL 500 MG/50ML IV EMUL
INTRAVENOUS | Status: DC | PRN
  Administered 2019-05-10: 100 ug/kg/min via INTRAVENOUS

## 2019-05-10 MED ORDER — SODIUM CHLORIDE 0.9 % IV SOLN
INTRAVENOUS | Status: DC
  Administered 2019-05-10 (×2): via INTRAVENOUS

## 2019-05-10 MED ORDER — PROPOFOL 10 MG/ML IV BOLUS
INTRAVENOUS | Status: DC | PRN
  Administered 2019-05-10: 50 mg via INTRAVENOUS

## 2019-05-10 MED ORDER — LIDOCAINE HCL (CARDIAC) PF 100 MG/5ML IV SOSY
PREFILLED_SYRINGE | INTRAVENOUS | Status: DC | PRN
  Administered 2019-05-10: 60 mg via INTRAVENOUS

## 2019-05-10 MED ORDER — PHENYLEPHRINE HCL (PRESSORS) 10 MG/ML IV SOLN
INTRAVENOUS | Status: DC | PRN
  Administered 2019-05-10 (×2): 100 ug via INTRAVENOUS

## 2019-05-10 MED ORDER — LIDOCAINE HCL (PF) 2 % IJ SOLN
INTRAMUSCULAR | Status: AC
  Filled 2019-05-10: qty 10

## 2019-05-10 MED ORDER — PROPOFOL 500 MG/50ML IV EMUL
INTRAVENOUS | Status: AC
  Filled 2019-05-10: qty 50

## 2019-05-10 NOTE — Transfer of Care (Signed)
Immediate Anesthesia Transfer of Care Note  Patient: Tamara [name]  Procedure(s) Performed: COLONOSCOPY WITH PROPOFOL (N/A )  Patient Location: PACU  Anesthesia Type:General  Level of Consciousness: sedated  Airway & Oxygen Therapy: Patient Spontanous Breathing and Patient connected to nasal cannula oxygen  Post-op Assessment: Report given to RN and Post -op Vital signs reviewed and stable  Post vital signs: Reviewed and stable  Last Vitals:  Vitals Value Taken Time  BP 109/57 05/10/19 1548  Temp 36.1 C 05/10/19 1548  Pulse 81 05/10/19 1549  Resp 29 05/10/19 1549  SpO2 98 % 05/10/19 1549  Vitals shown include unvalidated device data.  Last Pain:  Vitals:   05/10/19 1548  TempSrc: Tympanic  PainSc: 0-No pain         Complications: No apparent anesthesia complications

## 2019-05-10 NOTE — Op Note (Signed)
[facility] Gastroenterology Patient Name: Tamara [name] Procedure Date: 05/10/2019 3:05 PM MRN: [id number] Account #: [id number] Date of Birth: 03/05/1946 Admit Type: Outpatient Age: 73 Room: [facility] ENDO ROOM 1 Gender: Female Note Status: Finalized Procedure:            Colonoscopy Indications:          High risk colon cancer surveillance: Crohn's small and                        large intestine Providers:            Manya Silvas, MD Referring MD:         Deborra Medina, MD (Referring MD) Medicines:            Propofol per Anesthesia Complications:        No immediate complications. Procedure:            Pre-Anesthesia Assessment:                       - After reviewing the risks and benefits, the patient                        was deemed in satisfactory condition to undergo the                        procedure.                       After obtaining informed consent, the colonoscope was                        passed under direct vision. Throughout the procedure,                        the patient's blood pressure, pulse, and oxygen                        saturations were monitored continuously. The                        Colonoscope was introduced through the anus and                        advanced to the the ileocolonic anastomosis. The                        colonoscopy was somewhat difficult due to restricted                        mobility of the colon. The patient tolerated the                        procedure well. The quality of the bowel preparation                        was good. Findings:      The colon was very clear and no areas of any Crohn's disease, no visable       area of      Crohn's anywhere in the colon.. Internal hemorrhoids noted.      A small polyp was found in the descending colon.  The polyp was sessile.       The polyp was removed with a hot snare. Resection and retrieval were       complete.      A diminutive polyp was found in  the rectum. The polyp was sessile. The       polyp was removed with a cold snare. Resection and retrieval were       complete.      A diminutive polyp was found in the descending colon. The polyp was       sessile. The polyp was removed with a cold snare. Resection and       retrieval were complete. Impression:           - One small polyp in the descending colon, removed with                        a hot snare. Resected and retrieved.                       - One diminutive polyp in the rectum, removed with a                        cold snare. Resected and retrieved.                       - One diminutive polyp in the descending colon, removed                        with a cold snare. Resected and retrieved. Recommendation:       - Await pathology results. Manya Silvas, MD 05/10/2019 3:53:56 PM This report has been signed electronically. Number of Addenda: 0 Note Initiated On: 05/10/2019 3:05 PM Scope Withdrawal Time: 0 hours 18 minutes 39 seconds  Total Procedure Duration: 0 hours 25 minutes 31 seconds       [facility]

## 2019-05-10 NOTE — Anesthesia Preprocedure Evaluation (Signed)
Anesthesia Evaluation  Patient identified by MRN, date of birth, ID band Patient awake    Reviewed: Allergy & Precautions, NPO status , Patient's Chart, lab work & pertinent test results, reviewed documented beta blocker date and time   Airway Mallampati: II  TM Distance: >3 FB     Dental  (+) Chipped   Pulmonary Current Smoker,           Cardiovascular      Neuro/Psych  Headaches, PSYCHIATRIC DISORDERS Depression  Neuromuscular disease    GI/Hepatic   Endo/Other    Renal/GU      Musculoskeletal   Abdominal   Peds  Hematology   Anesthesia Other Findings Smokes. Jejunal bypass - Nissen had to be redone. OK now. Neck movement ojk. EKG shows PACs, otherwise ok.  Reproductive/Obstetrics                             Anesthesia Physical Anesthesia Plan  ASA: III  Anesthesia Plan: General   Post-op Pain Management:    Induction: Intravenous  PONV Risk Score and Plan:   Airway Management Planned:   Additional Equipment:   Intra-op Plan:   Post-operative Plan:   Informed Consent: I have reviewed the patients History and Physical, chart, labs and discussed the procedure including the risks, benefits and alternatives for the proposed anesthesia with the patient or authorized representative who has indicated his/her understanding and acceptance.       Plan Discussed with: CRNA  Anesthesia Plan Comments:         Anesthesia Quick Evaluation

## 2019-05-10 NOTE — Anesthesia Post-op Follow-up Note (Signed)
Anesthesia QCDR form completed.        

## 2019-05-10 NOTE — Anesthesia Postprocedure Evaluation (Signed)
Anesthesia Post Note  Patient: Tamara [name]  Procedure(s) Performed: COLONOSCOPY WITH PROPOFOL (N/A )  Patient location during evaluation: PACU Anesthesia Type: General Level of consciousness: awake and alert Pain management: pain level controlled Vital Signs Assessment: post-procedure vital signs reviewed and stable Respiratory status: spontaneous breathing, nonlabored ventilation and respiratory function stable Cardiovascular status: blood pressure returned to baseline and stable Postop Assessment: no apparent nausea or vomiting Anesthetic complications: no     Last Vitals:  Vitals:   05/10/19 1354 05/10/19 1548  BP: 122/81 (!) 109/57  Pulse: (!) 107 83  Resp: 20 (!) 23  Temp: 36.8 C (!) 36.1 C  SpO2: 98% 99%    Last Pain:  Vitals:   05/10/19 1618  TempSrc:   PainSc: 0-No pain                 Durenda Hurt

## 2019-05-11 ENCOUNTER — Encounter: Payer: Self-pay | Admitting: Unknown Physician Specialty

## 2019-05-12 DIAGNOSIS — G43719 Chronic migraine without aura, intractable, without status migrainosus: Secondary | ICD-10-CM | POA: Diagnosis not present

## 2019-05-12 LAB — SURGICAL PATHOLOGY

## 2019-05-13 ENCOUNTER — Other Ambulatory Visit: Payer: Self-pay | Admitting: Internal Medicine

## 2019-05-17 ENCOUNTER — Other Ambulatory Visit: Payer: Self-pay | Admitting: Internal Medicine

## 2019-05-19 DIAGNOSIS — G43719 Chronic migraine without aura, intractable, without status migrainosus: Secondary | ICD-10-CM | POA: Diagnosis not present

## 2019-05-20 DIAGNOSIS — G43719 Chronic migraine without aura, intractable, without status migrainosus: Secondary | ICD-10-CM | POA: Insufficient documentation

## 2019-05-20 HISTORY — DX: Chronic migraine without aura, intractable, without status migrainosus: G43.719

## 2019-05-26 DIAGNOSIS — G43109 Migraine with aura, not intractable, without status migrainosus: Secondary | ICD-10-CM | POA: Diagnosis not present

## 2019-05-26 DIAGNOSIS — K219 Gastro-esophageal reflux disease without esophagitis: Secondary | ICD-10-CM | POA: Diagnosis not present

## 2019-05-26 DIAGNOSIS — K50012 Crohn's disease of small intestine with intestinal obstruction: Secondary | ICD-10-CM | POA: Diagnosis not present

## 2019-05-26 DIAGNOSIS — R7989 Other specified abnormal findings of blood chemistry: Secondary | ICD-10-CM | POA: Diagnosis not present

## 2019-05-26 DIAGNOSIS — G43719 Chronic migraine without aura, intractable, without status migrainosus: Secondary | ICD-10-CM | POA: Diagnosis not present

## 2019-05-31 ENCOUNTER — Encounter: Payer: Medicare Other | Admitting: Oncology

## 2019-06-01 ENCOUNTER — Inpatient Hospital Stay: Payer: Medicare Other | Attending: Oncology | Admitting: Oncology

## 2019-06-01 ENCOUNTER — Inpatient Hospital Stay: Payer: Medicare Other

## 2019-06-01 ENCOUNTER — Other Ambulatory Visit: Payer: Self-pay

## 2019-06-01 ENCOUNTER — Encounter: Payer: Self-pay | Admitting: Oncology

## 2019-06-01 VITALS — BP 124/69 | HR 87 | Temp 96.3°F | Wt 103.7 lb

## 2019-06-01 DIAGNOSIS — F329 Major depressive disorder, single episode, unspecified: Secondary | ICD-10-CM | POA: Diagnosis not present

## 2019-06-01 DIAGNOSIS — E538 Deficiency of other specified B group vitamins: Secondary | ICD-10-CM | POA: Diagnosis not present

## 2019-06-01 DIAGNOSIS — R61 Generalized hyperhidrosis: Secondary | ICD-10-CM

## 2019-06-01 DIAGNOSIS — Z90710 Acquired absence of both cervix and uterus: Secondary | ICD-10-CM

## 2019-06-01 DIAGNOSIS — Z7982 Long term (current) use of aspirin: Secondary | ICD-10-CM | POA: Insufficient documentation

## 2019-06-01 DIAGNOSIS — D72829 Elevated white blood cell count, unspecified: Secondary | ICD-10-CM | POA: Insufficient documentation

## 2019-06-01 DIAGNOSIS — D7589 Other specified diseases of blood and blood-forming organs: Secondary | ICD-10-CM | POA: Diagnosis not present

## 2019-06-01 DIAGNOSIS — F17210 Nicotine dependence, cigarettes, uncomplicated: Secondary | ICD-10-CM | POA: Insufficient documentation

## 2019-06-01 DIAGNOSIS — Z79899 Other long term (current) drug therapy: Secondary | ICD-10-CM

## 2019-06-01 DIAGNOSIS — Z86718 Personal history of other venous thrombosis and embolism: Secondary | ICD-10-CM | POA: Diagnosis not present

## 2019-06-01 DIAGNOSIS — J449 Chronic obstructive pulmonary disease, unspecified: Secondary | ICD-10-CM | POA: Insufficient documentation

## 2019-06-01 DIAGNOSIS — K50012 Crohn's disease of small intestine with intestinal obstruction: Secondary | ICD-10-CM

## 2019-06-01 LAB — CBC WITH DIFFERENTIAL/PLATELET
Abs Immature Granulocytes: 0.03 10*3/uL (ref 0.00–0.07)
Basophils Absolute: 0.1 10*3/uL (ref 0.0–0.1)
Basophils Relative: 1 %
Eosinophils Absolute: 0.2 10*3/uL (ref 0.0–0.5)
Eosinophils Relative: 2 %
HCT: 39.1 % (ref 36.0–46.0)
Hemoglobin: 12.7 g/dL (ref 12.0–15.0)
Immature Granulocytes: 0 %
Lymphocytes Relative: 47 %
Lymphs Abs: 4.5 10*3/uL — ABNORMAL HIGH (ref 0.7–4.0)
MCH: 32.3 pg (ref 26.0–34.0)
MCHC: 32.5 g/dL (ref 30.0–36.0)
MCV: 99.5 fL (ref 80.0–100.0)
Monocytes Absolute: 1 10*3/uL (ref 0.1–1.0)
Monocytes Relative: 10 %
Neutro Abs: 3.9 10*3/uL (ref 1.7–7.7)
Neutrophils Relative %: 40 %
Platelets: 294 10*3/uL (ref 150–400)
RBC: 3.93 MIL/uL (ref 3.87–5.11)
RDW: 15 % (ref 11.5–15.5)
WBC: 9.7 10*3/uL (ref 4.0–10.5)
nRBC: 0 % (ref 0.0–0.2)

## 2019-06-01 LAB — RETIC PANEL
Immature Retic Fract: 6.2 % (ref 2.3–15.9)
RBC.: 3.93 MIL/uL (ref 3.87–5.11)
Retic Count, Absolute: 69.2 10*3/uL (ref 19.0–186.0)
Retic Ct Pct: 1.8 % (ref 0.4–3.1)
Reticulocyte Hemoglobin: 34.8 pg (ref >27.9)

## 2019-06-01 LAB — URIC ACID: Uric Acid, Serum: 3.6 mg/dL (ref 2.5–7.1)

## 2019-06-01 LAB — TECHNOLOGIST SMEAR REVIEW: Plt Morphology: ADEQUATE

## 2019-06-01 LAB — LACTATE DEHYDROGENASE: LDH: 151 U/L (ref 98–192)

## 2019-06-02 NOTE — Progress Notes (Signed)
Hematology/Oncology Consult note [facility] [phone] [phone]   Patient Care Team: Crecencio Mc, MD as PCP - General (Internal Medicine)  REFERRING PROVIDER: Crecencio Mc, MD  CHIEF COMPLAINTS/REASON FOR VISIT:  Evaluation of leukocytosis  HISTORY OF PRESENTING ILLNESS:  Tamara [name] is a  73 y.o.  female with PMH listed below who was seen at the request of Marval Regal, NP/Tullo, Aris Everts, MD for evaluation of leukocytosis.   Reviewed patient' recent labs ordered by [facility] gastro[facility]ology group via care everywhere. 05/26/2019 CBC showed elevated white count of 13.3, predominately lymphocytosis with increased lymphocyte percentage 50.6, absolute lymphocyte 6.73. A pathology review was done and the result was not available to me. Other studies includes iron panel showed iron saturation 20.  Ferritin 11 Nonremarkable CMP, TSH 2.23 Previous lab records reviewed. Leukocytosis onset of chronic, duration is since  Vitamin D level 21.5, CRP< 1 Vitamin B12 level was low.  Patient started B12 injections. Patient reports that she has been chronically having high white blood cell count.  Recalled that she had work-up over a decade ago.  Details unclear. Patient has a history of gastroesophageal reflux disease, history of Crohn's disease of the small intestine diagnosed with capsule endoscopy in 2005.  History of bowel obstructions.  She has been in remission since 2016 currently off any Crohn's disease medication.  Most recent colonoscopy 05/10/2019 by Dr. Vira Agar showed polyps in the descending colon and the rectum which were removed.  Pathology showed sessile serrated polyp and hyperplastic polyp.  Negative for dysplasia, malignancy.   No aggravating or elevated factors. Associated symptoms or signs:  Denies weight loss, fever, chills,night sweats.  Denies any fatigue. Smoking history: Current daily smoker, 50-pack-year  smoking history. History of recent oral steroid use or steroid injection:  History of recent infection: Denies any recent infection. Autoimmune disease history.  Crohn's disease  Today she reports feeling well at baseline.  No fever, chills, unintentional weight loss.  Appetite is good. Reports having sweats at night for a few months. Chronic epigastric discomfort.  Review of Systems  Constitutional: Negative for appetite change, chills, fatigue and fever.  HENT:   Negative for hearing loss and voice change.   Eyes: Negative for eye problems.  Respiratory: Negative for chest tightness and cough.   Cardiovascular: Negative for chest pain.  Gastrointestinal: Negative for abdominal distention, abdominal pain and blood in stool.       Chronic epigastric discomfort/acid reflux  Endocrine: Negative for hot flashes.  Genitourinary: Negative for difficulty urinating and frequency.   Musculoskeletal: Negative for arthralgias.  Skin: Negative for itching and rash.  Neurological: Negative for extremity weakness.  Hematological: Negative for adenopathy.  Psychiatric/Behavioral: Negative for confusion.     MEDICAL HISTORY:  Past Medical History:  Diagnosis Date  . Bowel obstruction ([place])    s/p laparotomy/ LOA 3/08, prior reversal of jejunal bypasss 2004  . Chronic abdominal pain   . COPD (chronic obstructive pulmonary disease) ([place])   . Crohn's disease ([place])   . Depression   . Headache   . History of DVT (deep vein thrombosis) 01/2007   post operative right leg, s/p vena cava filter  . Osteoporosis     SURGICAL HISTORY: Past Surgical History:  Procedure Laterality Date  . ABDOMINAL HYSTERECTOMY    . APPENDECTOMY    . CARPAL TUNNEL RELEASE    . CATARACT EXTRACTION    . CERVICAL FUSION  2005   C6-7, anterior approach  .  CHOLECYSTECTOMY    . COLONOSCOPY  2015  . COLONOSCOPY WITH PROPOFOL N/A 05/10/2019   Procedure: COLONOSCOPY WITH PROPOFOL;  Surgeon: Manya Silvas, MD;   Location: [facility] ENDOSCOPY;  Service: Endoscopy;  Laterality: N/A;  . ESOPHAGEAL MANOMETRY N/A 02/11/2018   Procedure: ESOPHAGEAL MANOMETRY (EM);  Surgeon: Lucilla Lame, MD;  Location: [facility] ENDOSCOPY;  Service: Endoscopy;  Laterality: N/A;  . ESOPHAGOGASTRODUODENOSCOPY (EGD) WITH PROPOFOL N/A 12/17/2017   Procedure: ESOPHAGOGASTRODUODENOSCOPY (EGD) WITH PROPOFOL;  Surgeon: Robert Bellow, MD;  Location: [facility] ENDOSCOPY;  Service: Endoscopy;  Laterality: N/A;  . ESOPHAGOGASTRODUODENOSCOPY (EGD) WITH PROPOFOL N/A 01/08/2018   Procedure: ESOPHAGOGASTRODUODENOSCOPY (EGD) WITH PROPOFOL;  Surgeon: Robert Bellow, MD;  Location: [facility] ENDOSCOPY;  Service: Endoscopy;  Laterality: N/A;  . gyn surgery     hysterectomy  . LAPAROTOMY  01/30/07   for bowel obstruction with LOA  . LUMBAR LAMINECTOMY/DECOMPRESSION MICRODISCECTOMY N/A 12/25/2016   Procedure: LUMBAR DECOMPRESSION L4-5;  Surgeon: Meade Maw, MD;  Location: [facility] ORS;  Service: Neurosurgery;  Laterality: N/A;  . NISSEN FUNDOPLICATION    . ROTATOR CUFF REPAIR     right  . SPLENECTOMY     secondary to colonoscopy prep  . TONSILLECTOMY      SOCIAL HISTORY: Social History   Socioeconomic History  . Marital status: Widowed    Spouse name: Not on file  . Number of children: Not on file  . Years of education: Not on file  . Highest education level: Not on file  Occupational History  . Not on file  Social Needs  . Financial resource strain: Not on file  . Food insecurity    Worry: Not on file    Inability: Not on file  . Transportation needs    Medical: Not on file    Non-medical: Not on file  Tobacco Use  . Smoking status: Current Every Day Smoker    Packs/day: 1.00    Years: 50.00    Pack years: 50.00    Types: Cigarettes    Last attempt to quit: 12/13/2014    Years since quitting: 4.4  . Smokeless tobacco: Never Used  . Tobacco comment: half to one pack of cigs per day; stopped Chantix; plans to restart  Substance and  Sexual Activity  . Alcohol use: No    Alcohol/week: 0.0 standard drinks  . Drug use: No  . Sexual activity: Not on file  Lifestyle  . Physical activity    Days per week: Not on file    Minutes per session: Not on file  . Stress: Not on file  Relationships  . Social Herbalist on phone: Not on file    Gets together: Not on file    Attends religious service: Not on file    Active member of club or organization: Not on file    Attends meetings of clubs or organizations: Not on file    Relationship status: Not on file  . Intimate partner violence    Fear of current or ex partner: Not on file    Emotionally abused: Not on file    Physically abused: Not on file    Forced sexual activity: Not on file  Other Topics Concern  . Not on file  Social History Narrative   Lives with spouse.    FAMILY HISTORY: Family History  Problem Relation Age of Onset  . Diabetes Mother        type 2  . Heart disease Mother   . Hypertension  Mother   . Coronary artery disease Father   . Heart attack Sister     ALLERGIES:  is allergic to sulfa antibiotics.  MEDICATIONS:  Current Outpatient Medications  Medication Sig Dispense Refill  . aspirin EC 81 MG tablet Take 81 mg by mouth daily.    . clonazePAM (KLONOPIN) 0.5 MG tablet TAKE 1 TABLET BY MOUTH ONCE DAILY AS NEEDED ANXIETY 30 tablet 5  . cyanocobalamin (,VITAMIN B-12,) 1000 MCG/ML injection Inject into the muscle.    . nortriptyline (PAMELOR) 25 MG capsule TAKE 1 CAPSULE BY MOUTH AT BEDTIME. 90 capsule 1  . omeprazole (PRILOSEC) 40 MG capsule TAKE 1 CAPSULE BY MOUTH TWICE DAILY 180 capsule 1  . sertraline (ZOLOFT) 100 MG tablet TAKE 1 TABLET BY MOUTH ONCE DAILY 90 tablet 1  . traZODone (DESYREL) 50 MG tablet Take 100 mg by mouth at bedtime.    . Vitamin D, Ergocalciferol, (DRISDOL) 1.25 MG (50000 UT) CAPS capsule Take by mouth.    . varenicline (CHANTIX STARTING MONTH PAK) 0.5 MG X 11 & 1 MG X 42 tablet Take one 0.5 mg tablet by  mouth once daily for 3 days, then increase to one 0.5 mg tablet twice daily for 4 days, then increase to one 1 mg tablet twice daily. (Patient not taking: Reported on 06/01/2019) 53 tablet 0   No current facility-administered medications for this visit.      PHYSICAL EXAMINATION: ECOG PERFORMANCE STATUS: 1 - Symptomatic but completely ambulatory Vitals:   06/01/19 1140  BP: 124/69  Pulse: 87  Temp: (!) 96.3 F (35.7 C)   Filed Weights   06/01/19 1140  Weight: 103 lb 11.2 oz (47 kg)    Physical Exam Constitutional:      General: She is not in acute distress. HENT:     Head: Normocephalic and atraumatic.  Eyes:     General: No scleral icterus.    Pupils: Pupils are equal, round, and reactive to light.  Neck:     Musculoskeletal: Normal range of motion and neck supple.  Cardiovascular:     Rate and Rhythm: Normal rate and regular rhythm.     Heart sounds: Normal heart sounds.  Pulmonary:     Effort: Pulmonary effort is normal. No respiratory distress.     Breath sounds: No wheezing.  Abdominal:     General: Bowel sounds are normal. There is no distension.     Palpations: Abdomen is soft. There is no mass.     Tenderness: There is no abdominal tenderness.  Musculoskeletal: Normal range of motion.        General: No deformity.  Skin:    General: Skin is warm and dry.     Findings: No erythema or rash.  Neurological:     Mental Status: She is alert and oriented to person, place, and time.     Cranial Nerves: No cranial nerve deficit.     Coordination: Coordination normal.  Psychiatric:        Behavior: Behavior normal.        Thought Content: Thought content normal.     CMP Latest Ref Rng & Units 07/08/2018  Glucose 70 - 99 mg/dL 85  BUN 6 - 23 mg/dL 14  Creatinine 0.40 - 1.20 mg/dL 0.75  Sodium 135 - 145 mEq/L 139  Potassium 3.5 - 5.1 mEq/L 4.3  Chloride 96 - 112 mEq/L 107  CO2 19 - 32 mEq/L 23  Calcium 8.4 - 10.5 mg/dL 8.9  Total Protein 6.0 -  8.3 g/dL 6.4   Total Bilirubin 0.2 - 1.2 mg/dL 0.4  Alkaline Phos 39 - 117 U/L 46  AST 0 - 37 U/L 24  ALT 0 - 35 U/L 19   CBC Latest Ref Rng & Units 06/01/2019  WBC 4.0 - 10.5 K/uL 9.7  Hemoglobin 12.0 - 15.0 g/dL 12.7  Hematocrit 36.0 - 46.0 % 39.1  Platelets 150 - 400 K/uL 294     No results found.  LABORATORY DATA:  I have reviewed the data as listed Lab Results  Component Value Date   WBC 9.7 06/01/2019   HGB 12.7 06/01/2019   HCT 39.1 06/01/2019   MCV 99.5 06/01/2019   PLT 294 06/01/2019   Recent Labs    07/08/18 0907  NA 139  K 4.3  CL 107  CO2 23  GLUCOSE 85  BUN 14  CREATININE 0.75  CALCIUM 8.9  PROT 6.4  ALBUMIN 4.2  AST 24  ALT 19  ALKPHOS 46  BILITOT 0.4   Iron/TIBC/Ferritin/ %Sat    Component Value Date/Time   IRON 151 (H) 06/11/2017 1022   IRONPCTSAT 33.4 06/11/2017 1022        ASSESSMENT & PLAN:  1. Macrocytosis   2. Leukocytosis, unspecified type   3. Night sweat    Labs reviewed and discussed with patient that Leukocytosis, predominantly neutrophilia, can be secondary to infection, chronic inflammation, smoking, autoimmune disease, or underlying bone marrow disorders.   Patient has a chronic history of leukocytosis. Patient has a history of autoimmune colitis, asymptomatic currently.  Off the medication.  CRP less than 1. She also has a long history of smoking which may lead to chronic elevation of white count. For the work up of patient's leukocytosis, I recommend checking CBC;CMP, LDH, pathology smear review, flowcytometry, etc.  Vitamin B12 deficiency, she is currently on vitamin B12 parenteral injections.  Repeat B12 in the future. Macrocytosis likely secondary to B12 deficiency. Night sweats, etiology unknown.  Pending above work-up.   Orders Placed This Encounter  Procedures  . CBC with Differential/Platelet    Standing Status:   Future    Number of Occurrences:   1    Standing Expiration Date:   05/31/2020  . Technologist smear review     Standing Status:   Future    Number of Occurrences:   1    Standing Expiration Date:   05/31/2020  . Retic Panel    Standing Status:   Future    Number of Occurrences:   1    Standing Expiration Date:   05/31/2020  . Flow cytometry panel-leukemia/lymphoma work-up    Standing Status:   Future    Number of Occurrences:   1    Standing Expiration Date:   05/31/2020  . Lactate dehydrogenase    Standing Status:   Future    Number of Occurrences:   1    Standing Expiration Date:   05/31/2020  . Uric acid    Standing Status:   Future    Number of Occurrences:   1    Standing Expiration Date:   05/31/2020    All questions were answered. The patient knows to call the clinic with any problems questions or concerns.  Return of visit: 2 weeks to discuss labs. Thank you for this kind referral and the opportunity to participate in the care of this patient. A copy of today's note is routed to referring provider  Total face to face encounter time for this patient visit was  45 min. >50% of the time was  spent in counseling and coordination of care.    Earlie Server, MD, PhD Hematology Oncology [facility] Pager- [phone] 06/02/2019

## 2019-06-04 LAB — COMP PANEL: LEUKEMIA/LYMPHOMA

## 2019-06-07 ENCOUNTER — Other Ambulatory Visit: Payer: Self-pay | Admitting: Internal Medicine

## 2019-06-14 DIAGNOSIS — M810 Age-related osteoporosis without current pathological fracture: Secondary | ICD-10-CM | POA: Diagnosis not present

## 2019-06-15 IMAGING — RF DG ESOPHAGUS
9 series · 11 of 11 positions shown · non-contrast
Comparison: None in PACs

CLINICAL DATA: Several month history of dysphagia to most solids
and liquids. Remote history of anti-reflux surgery.

EXAM:
ESOPHOGRAM / BARIUM SWALLOW / BARIUM TABLET STUDY
TECHNIQUE: Combined double contrast and single contrast examination performed
using effervescent crystals, thick barium liquid, and thin barium
liquid. The patient was observed with fluoroscopy swallowing a 13 mm
barium sulphate tablet.
FLUOROSCOPY TIME:  Fluoroscopy Time:  1 minute, 54 seconds
Radiation Exposure Index (if provided by the fluoroscopic device):
444 micro Gy per meter square
Number of Acquired Spot Images: 8+ 1 video loop

[Series 2: fluoro_barium 2fps_bw · 0.18mm/px · 1 of 1 slices shown (1 of 9)]
[im 1/1]
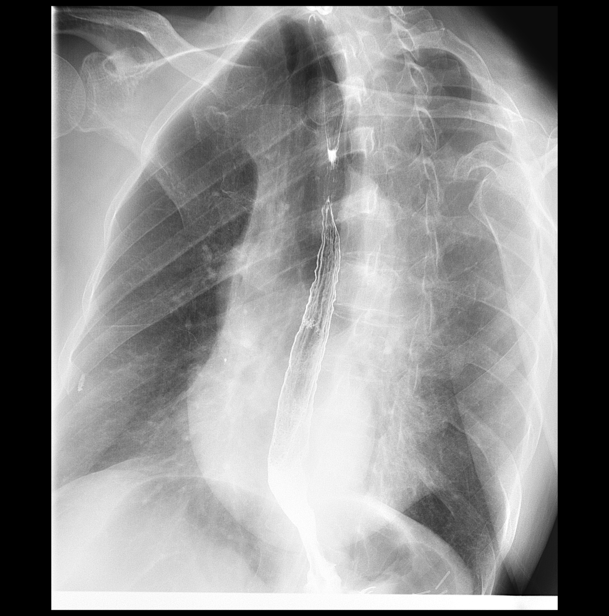

[Series 3: fluoro_barium 2fps_bw · 0.18mm/px · 1 of 1 slices shown (2 of 9)]
[im 1/1]
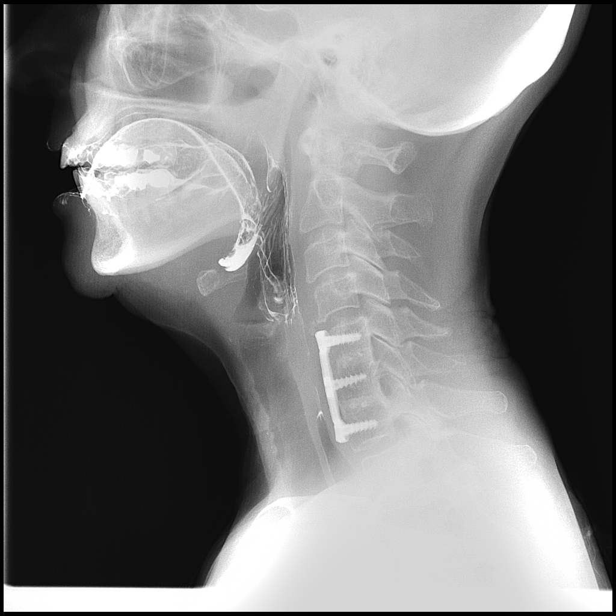

[Series 4: fluoro_barium 2fps_bw · 0.18mm/px · 1 of 1 slices shown (3 of 9)]
[im 1/1]
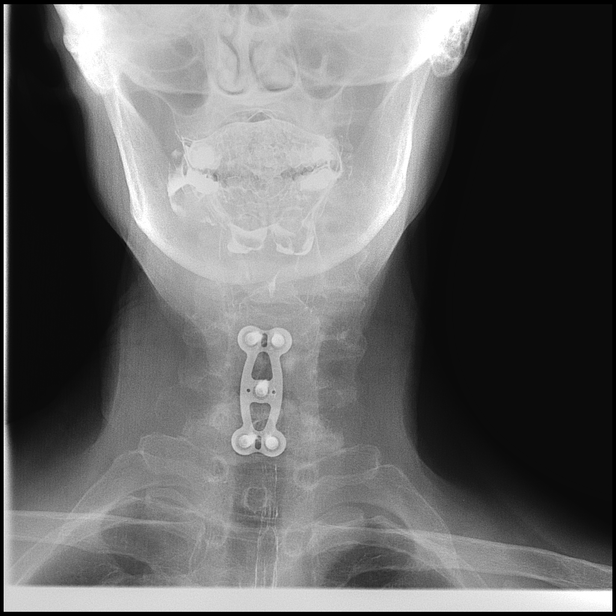

[Series 5: fluoro_barium 2fps_bw · 0.18mm/px · 1 of 1 slices shown (4 of 9)]
[im 1/1]
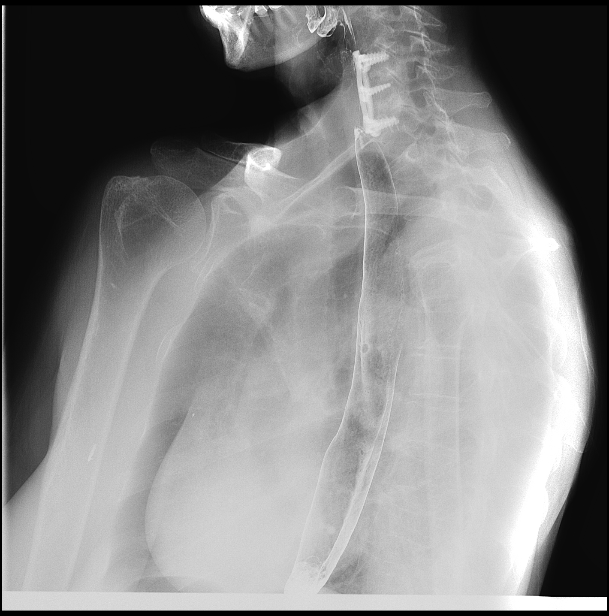

[Series 6: fluoro_barium 2fps_bw · 0.18mm/px · 1 of 1 slices shown (5 of 9)]
[im 1/1]
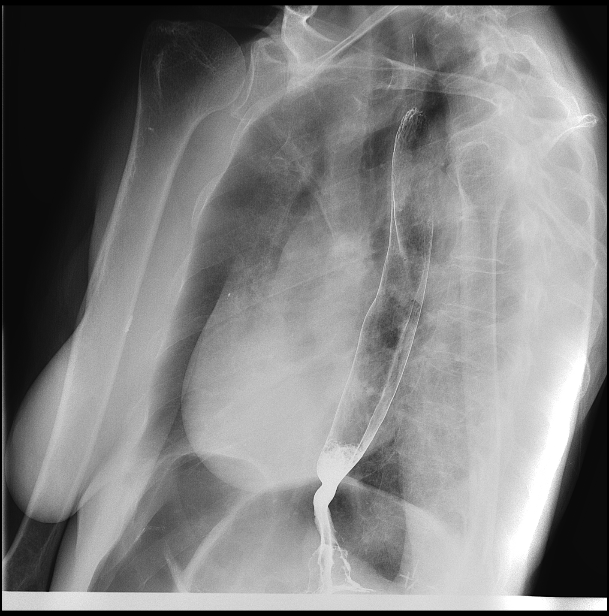

[Series 7: fluoro_barium 2fps_bw · 0.19mm/px · 1 of 1 slices shown (6 of 9)]
[im 1/1]
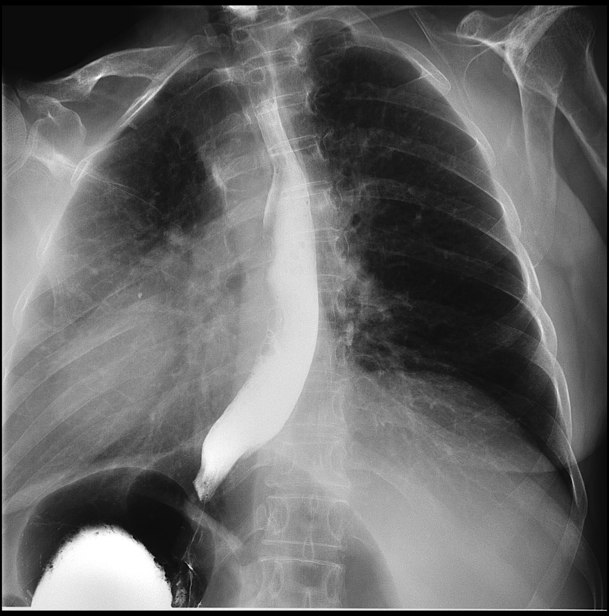

[Series 8: fluoro_barium 2fps_bw · 0.19mm/px · 1 of 1 slices shown (7 of 9)]
[im 1/1]
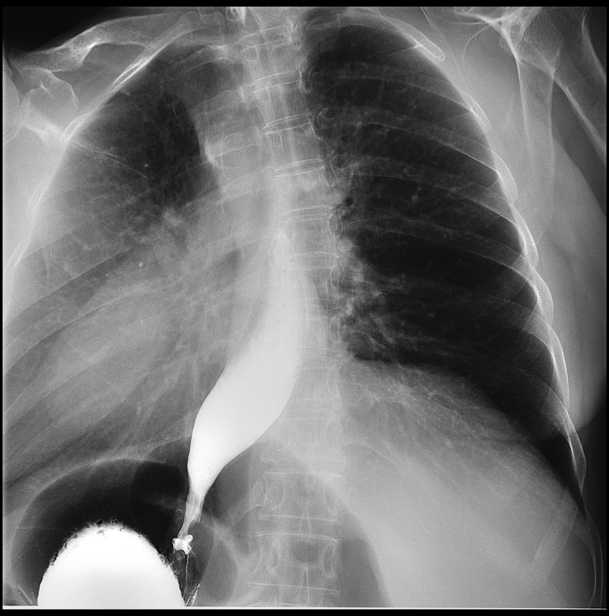

[Series 9: fluoro_barium 2fps_bw · 0.19mm/px · 3 of 20 frames shown (8 of 9)]
[frame 4/20]
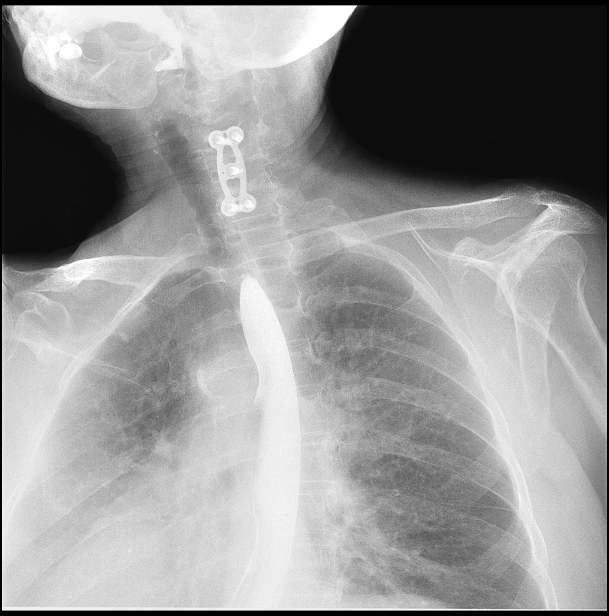
[frame 11/20]
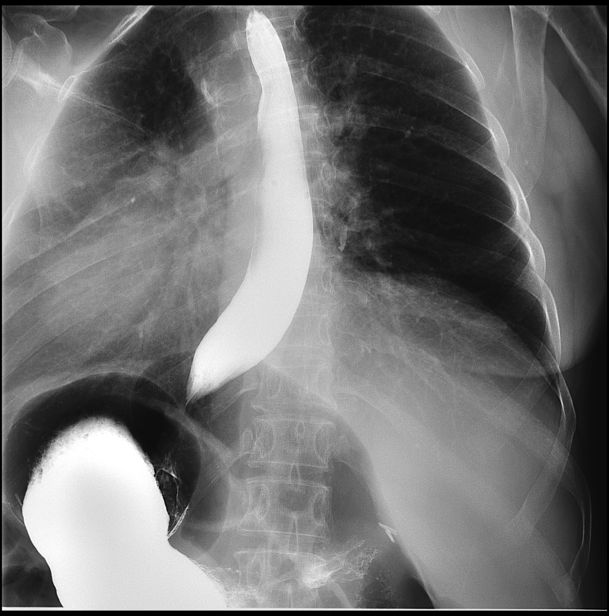
[frame 18/20]
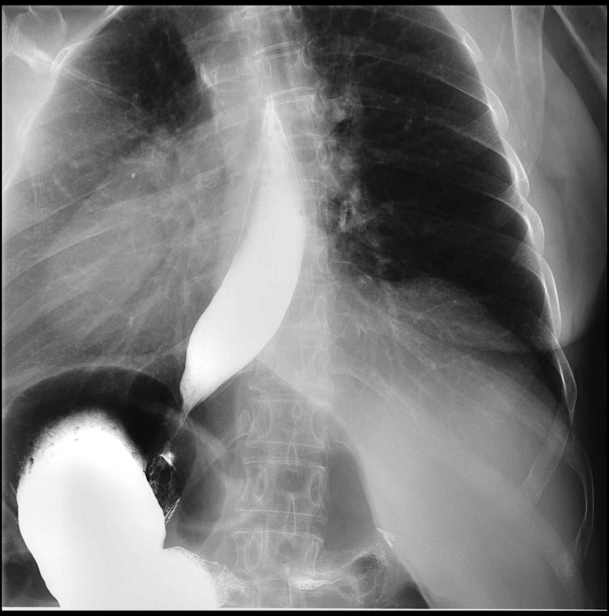

[Series 10: fluoro_barium 2fps_bw · 0.18mm/px · 1 of 1 slices shown (9 of 9)]
[im 1/1]
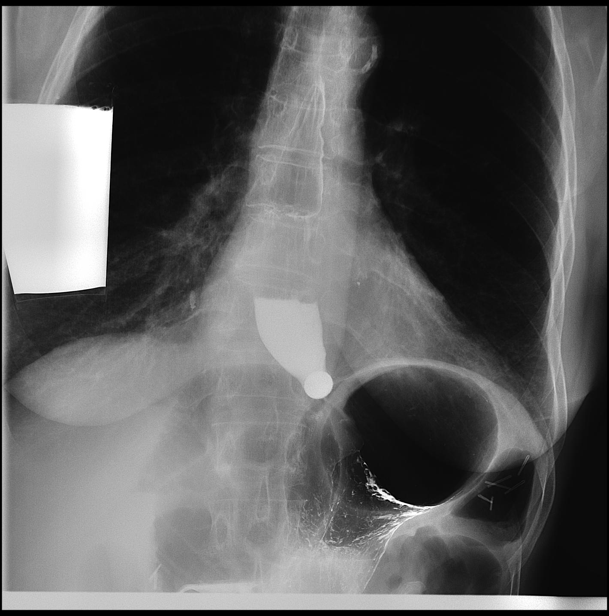

[11 of 11 positions shown; findings below may reference images not displayed]

FINDINGS: The patient ingested thick and thin barium and the gas-forming
crystals without difficulty. The hypopharynx distended well. There
was no laryngeal penetration of the barium. The cervical esophagus
distended well. The anterior fusion plate in the C5 through C7
region did not produce significant mass effect upon the proximal
cervical esophagus. The thoracic esophagus distended well down to
the GE junction. Here there was fairly fixed narrowing which
resulted in some delay in passage of the liquid barium. The mucosal
pattern of the esophagus was normal. In the prone position
propagation of the primary peristaltic and secondary peristaltic
waves was mildly delayed.

The barium tablet passed promptly from the mouth to the stomach but
hung at the GE junction.
IMPRESSION: Mild changes of presbyesophagus. Narrowing at the GE junction which
produce moderate obstruction to passage of liquid barium and would
not allow passage of the barium tablet. This may be related to the
previous anti reflux procedure. No mucosal irregularity, mass, or
hiatal hernia was observed.

## 2019-06-15 IMAGING — CR DG SMALL BOWEL
3 series · 6 of 6 positions shown · non-contrast
Comparison: Noncontrast abdominal and pelvic CT scan August 14, 2011

CLINICAL DATA: History of Crohn's disease with bowel obstruction.
Pre for previous laparotomy. Patient reports mid and upper abdominal
pain and burning sensation. Recent episode of nausea, vomiting, and
diarrhea.

EXAM:
SMALL BOWEL SERIES
TECHNIQUE: Following ingestion of thin barium, serial small bowel images were
obtained including spot views of the terminal ileum.
FLUOROSCOPY TIME:  Fluoroscopy Time:  1 minute, 54 seconds
Radiation Exposure Index (if provided by the fluoroscopic device):
37 mGy
Number of Acquired Spot Images: 3+ 3 overhead images.

[Series 1: t abdomen supine · 0.14mm/px · 2 of 2 slices shown (1 of 2)]
[im 1/2]
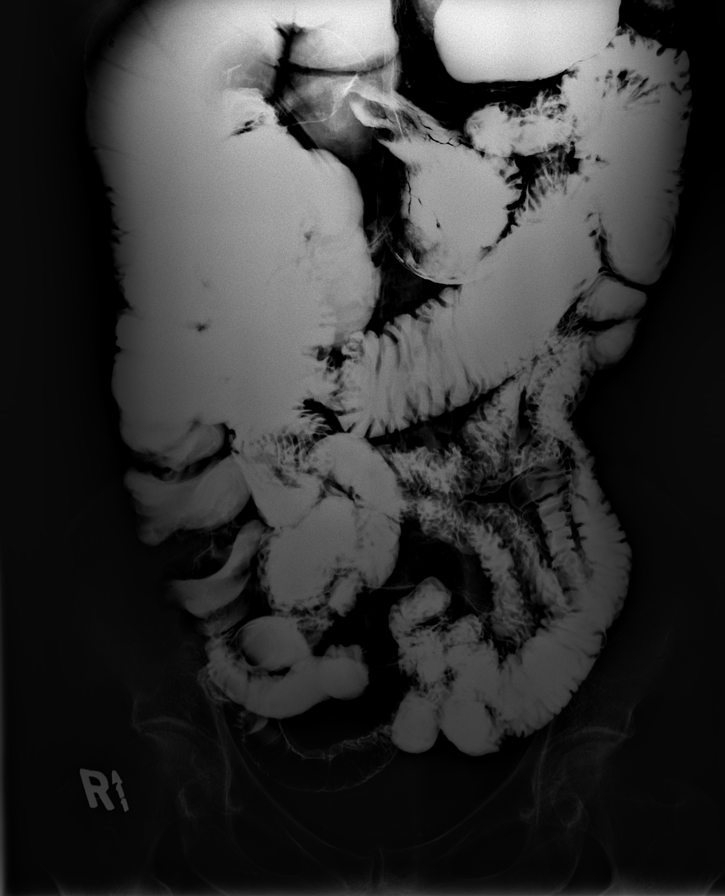
[im 2/2]
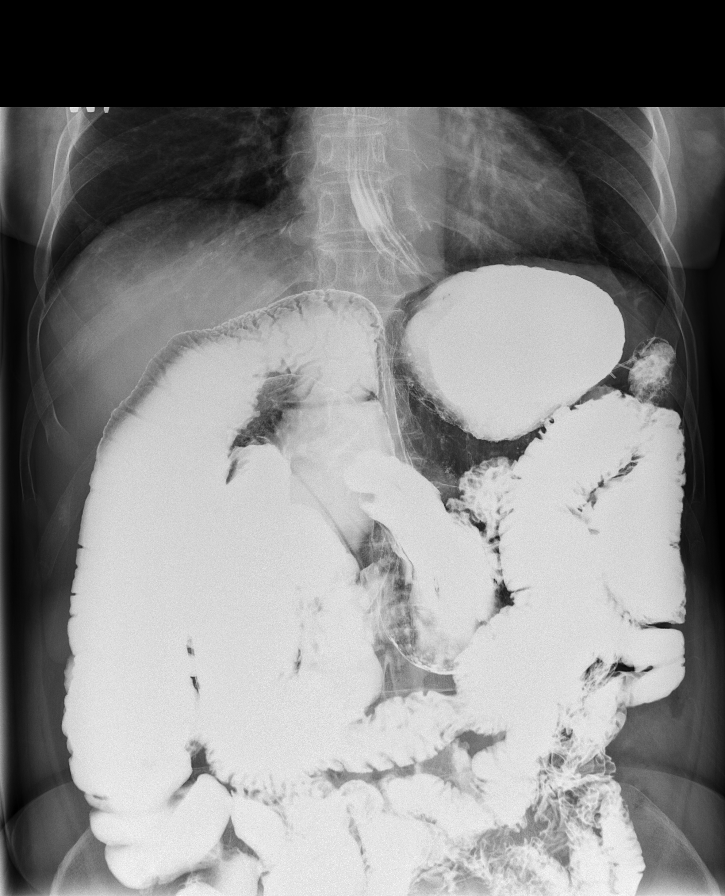

[t abdomen supine (2 of 2)]
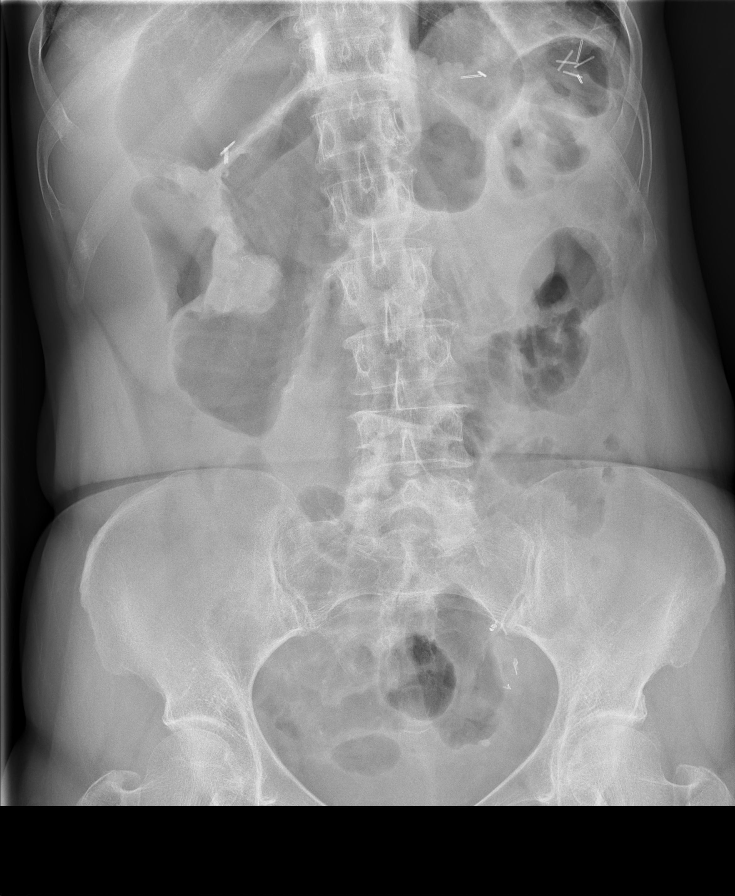

[Series 3: fluoro_barium singleshot_bw · 0.17mm/px · 3 of 3 slices shown]
[im 1/3]
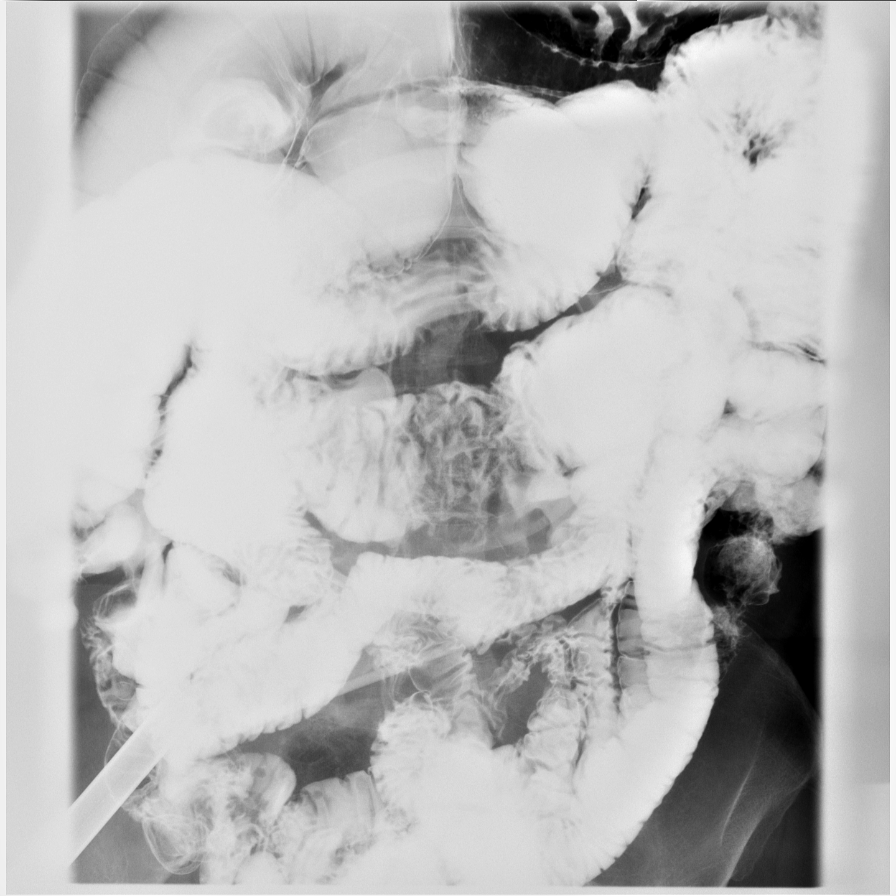
[im 2/3]
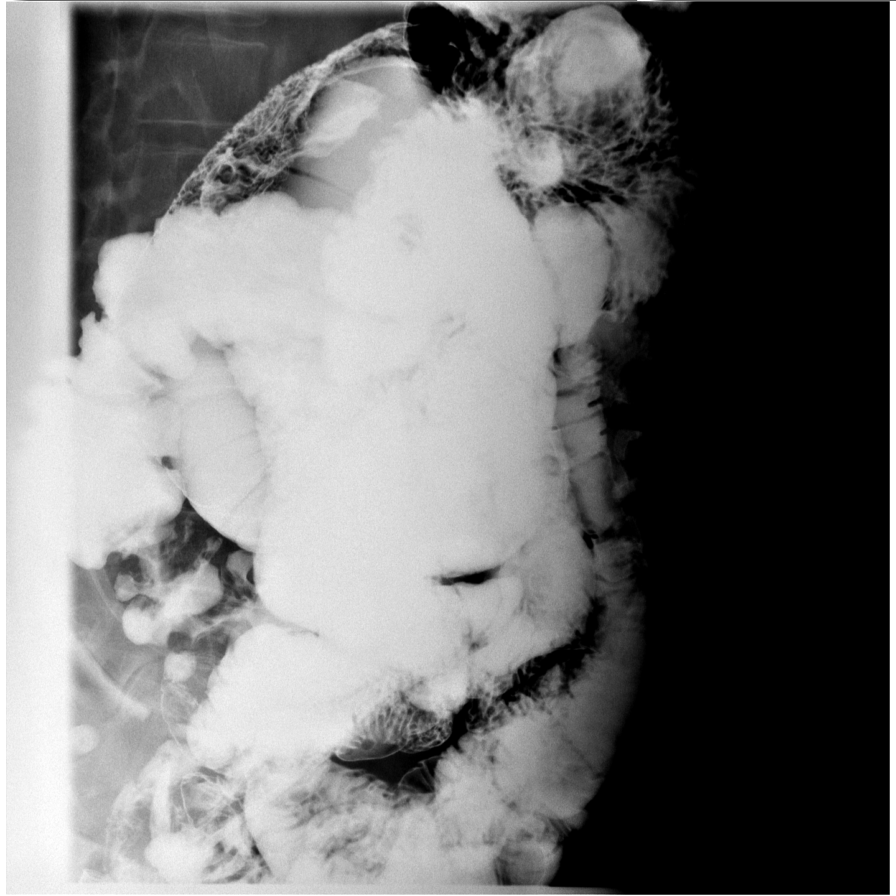
[im 3/3]
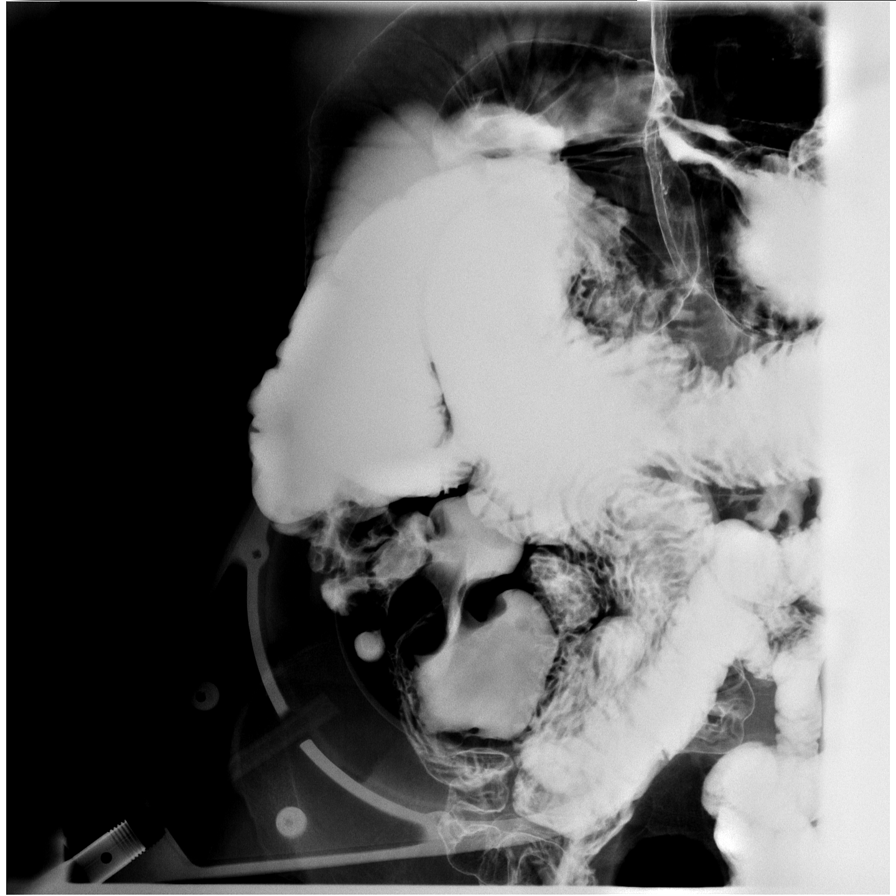

[6 of 6 positions shown; findings below may reference images not displayed]

FINDINGS: The scout film revealed a fairly nonspecific bowel gas pattern with
there being a loop of mildly distended small bowel in the mid
abdomen. No obstructive pattern was observed. There surgical clips
in the gallbladder fossa, left upper quadrant, and left aspect of
the pelvis.

The study was performed at the conclusion of a barium swallow
examination. The loops of jejunum demonstrated transient mild
distention. More distally the jejunum exhibited normal caliber. A
dilated loop of ileum was found to be present at the site of the
previously demonstrated gas-filled small bowel on the scout image.
Barium passed however to the right colon over the course of
approximately 20 minutes. The terminal ileum was demonstrated with
some difficulty and exhibited normal peristalsis. The patient was
tender over the mid abdomen.
IMPRESSION: No evidence of obstruction. Mild dilation of several loops of ileum.
No fixed stricture was observed. The patient reported cramping and
abdominal burning sensation during the study. Normal normal
appearing terminal ileum.

## 2019-06-16 ENCOUNTER — Other Ambulatory Visit: Payer: Self-pay

## 2019-06-17 ENCOUNTER — Other Ambulatory Visit: Payer: Self-pay

## 2019-06-17 ENCOUNTER — Inpatient Hospital Stay: Payer: Medicare Other | Attending: Oncology | Admitting: Oncology

## 2019-06-17 ENCOUNTER — Encounter: Payer: Self-pay | Admitting: Oncology

## 2019-06-17 VITALS — BP 131/72 | HR 94 | Temp 97.3°F | Wt 103.1 lb

## 2019-06-17 DIAGNOSIS — Z7982 Long term (current) use of aspirin: Secondary | ICD-10-CM | POA: Insufficient documentation

## 2019-06-17 DIAGNOSIS — Z79899 Other long term (current) drug therapy: Secondary | ICD-10-CM | POA: Insufficient documentation

## 2019-06-17 DIAGNOSIS — F17210 Nicotine dependence, cigarettes, uncomplicated: Secondary | ICD-10-CM | POA: Insufficient documentation

## 2019-06-17 DIAGNOSIS — K5000 Crohn's disease of small intestine without complications: Secondary | ICD-10-CM | POA: Insufficient documentation

## 2019-06-17 DIAGNOSIS — D72829 Elevated white blood cell count, unspecified: Secondary | ICD-10-CM | POA: Insufficient documentation

## 2019-06-17 DIAGNOSIS — F329 Major depressive disorder, single episode, unspecified: Secondary | ICD-10-CM | POA: Insufficient documentation

## 2019-06-17 DIAGNOSIS — R61 Generalized hyperhidrosis: Secondary | ICD-10-CM

## 2019-06-17 DIAGNOSIS — J449 Chronic obstructive pulmonary disease, unspecified: Secondary | ICD-10-CM | POA: Diagnosis not present

## 2019-06-17 DIAGNOSIS — Z86718 Personal history of other venous thrombosis and embolism: Secondary | ICD-10-CM | POA: Insufficient documentation

## 2019-06-17 DIAGNOSIS — Z90710 Acquired absence of both cervix and uterus: Secondary | ICD-10-CM | POA: Insufficient documentation

## 2019-06-17 NOTE — Progress Notes (Addendum)
Hematology/Oncology [facility] [phone] [phone]   Patient Care Team: Crecencio Mc, MD as PCP - General (Internal Medicine)  REFERRING PROVIDER: Crecencio Mc, MD  CHIEF COMPLAINTS/REASON FOR VISIT:  Follow-up for leukocytosis  HISTORY OF PRESENTING ILLNESS:  Tamara [name] is a  73 y.o.  female with PMH listed below who was seen at the request of Crecencio Mc, MD/[place], Aris Everts, MD for evaluation of leukocytosis.   Reviewed patient' recent labs ordered by [facility] clinic gastroenterology group via care everywhere. 05/26/2019 CBC showed elevated white count of 13.3, predominately lymphocytosis with increased lymphocyte percentage 50.6, absolute lymphocyte 6.73. A pathology review was done and the result was not available to me. Other studies includes iron panel showed iron saturation 20.  Ferritin 11 Nonremarkable CMP, TSH 2.23 Previous lab records reviewed. Leukocytosis onset of chronic, duration is since  Vitamin D level 21.5, CRP< 1 Vitamin B12 level was low.  Patient started B12 injections. Patient reports that she has been chronically having high white blood cell count.  Recalled that she had work-up over a decade ago.  Details unclear. Patient has a history of gastroesophageal reflux disease, history of Crohn's disease of the small intestine diagnosed with capsule endoscopy in 2005.  History of bowel obstructions.  She has been in remission since 2016 currently off any Crohn's disease medication.  Most recent colonoscopy 05/10/2019 by Dr. Vira Agar showed polyps in the descending colon and the rectum which were removed.  Pathology showed sessile serrated polyp and hyperplastic polyp.  Negative for dysplasia, malignancy.   No aggravating or elevated factors. Associated symptoms or signs:  Denies weight loss, fever, chills,night sweats.  Denies any fatigue. Smoking history: Current daily smoker, 50-pack-year smoking history.  History of recent oral steroid use or steroid injection:  History of recent infection: Denies any recent infection. Autoimmune disease history.  Crohn's disease  Today she reports feeling well at baseline.  No fever, chills, unintentional weight loss.  Appetite is good. Reports having sweats at night for a few months. Chronic epigastric discomfort.  INTERVAL HISTORY Tamara [name] is a 73 y.o. female who has above history reviewed by me today presents for follow up visit for management of leukocytosis Problems and complaints are listed below: Patient had blood work done during interval. No new complaints. Per patient's request, patient's son Tamara [name] was called during this encounter and Tamara [name] was able to hear the conversation and participate in discussion.   Review of Systems  Constitutional: Negative for appetite change, chills, fatigue and fever.  HENT:   Negative for hearing loss and voice change.   Eyes: Negative for eye problems.  Respiratory: Negative for chest tightness and cough.   Cardiovascular: Negative for chest pain.  Gastrointestinal: Negative for abdominal distention, abdominal pain and blood in stool.       Chronic epigastric discomfort/acid reflux  Endocrine: Negative for hot flashes.  Genitourinary: Negative for difficulty urinating and frequency.   Musculoskeletal: Negative for arthralgias.  Skin: Negative for itching and rash.  Neurological: Negative for extremity weakness.  Hematological: Negative for adenopathy.  Psychiatric/Behavioral: Negative for confusion.     MEDICAL HISTORY:  Past Medical History:  Diagnosis Date  . Bowel obstruction ([place])    s/p laparotomy/ LOA 3/08, prior reversal of jejunal bypasss 2004  . Chronic abdominal pain   . COPD (chronic obstructive pulmonary disease) ([place])   . Crohn's disease ([place])   . Depression   . Headache   . History of DVT (  deep vein thrombosis) 01/2007   post operative right leg, s/p vena cava filter  .  Osteoporosis     SURGICAL HISTORY: Past Surgical History:  Procedure Laterality Date  . ABDOMINAL HYSTERECTOMY    . APPENDECTOMY    . CARPAL TUNNEL RELEASE    . CATARACT EXTRACTION    . CERVICAL FUSION  2005   C6-7, anterior approach  . CHOLECYSTECTOMY    . COLONOSCOPY  2015  . COLONOSCOPY WITH PROPOFOL N/A 05/10/2019   Procedure: COLONOSCOPY WITH PROPOFOL;  Surgeon: Manya Silvas, MD;  Location: [facility] ENDOSCOPY;  Service: Endoscopy;  Laterality: N/A;  . ESOPHAGEAL MANOMETRY N/A 02/11/2018   Procedure: ESOPHAGEAL MANOMETRY (EM);  Surgeon: Lucilla Lame, MD;  Location: [facility] ENDOSCOPY;  Service: Endoscopy;  Laterality: N/A;  . ESOPHAGOGASTRODUODENOSCOPY (EGD) WITH PROPOFOL N/A 12/17/2017   Procedure: ESOPHAGOGASTRODUODENOSCOPY (EGD) WITH PROPOFOL;  Surgeon: Robert Bellow, MD;  Location: [facility] ENDOSCOPY;  Service: Endoscopy;  Laterality: N/A;  . ESOPHAGOGASTRODUODENOSCOPY (EGD) WITH PROPOFOL N/A 01/08/2018   Procedure: ESOPHAGOGASTRODUODENOSCOPY (EGD) WITH PROPOFOL;  Surgeon: Robert Bellow, MD;  Location: [facility] ENDOSCOPY;  Service: Endoscopy;  Laterality: N/A;  . gyn surgery     hysterectomy  . LAPAROTOMY  01/30/07   for bowel obstruction with LOA  . LUMBAR LAMINECTOMY/DECOMPRESSION MICRODISCECTOMY N/A 12/25/2016   Procedure: LUMBAR DECOMPRESSION L4-5;  Surgeon: Meade Maw, MD;  Location: [facility] ORS;  Service: Neurosurgery;  Laterality: N/A;  . NISSEN FUNDOPLICATION    . ROTATOR CUFF REPAIR     right  . SPLENECTOMY     secondary to colonoscopy prep  . TONSILLECTOMY      SOCIAL HISTORY: Social History   Socioeconomic History  . Marital status: Widowed    Spouse name: Not on file  . Number of children: Not on file  . Years of education: Not on file  . Highest education level: Not on file  Occupational History  . Not on file  Social Needs  . Financial resource strain: Not on file  . Food insecurity    Worry: Not on file    Inability: Not on file  . Transportation  needs    Medical: Not on file    Non-medical: Not on file  Tobacco Use  . Smoking status: Current Every Day Smoker    Packs/day: 1.00    Years: 50.00    Pack years: 50.00    Types: Cigarettes    Last attempt to quit: 12/13/2014    Years since quitting: 4.5  . Smokeless tobacco: Never Used  . Tobacco comment: half to one pack of cigs per day; stopped Chantix; plans to restart  Substance and Sexual Activity  . Alcohol use: No    Alcohol/week: 0.0 standard drinks  . Drug use: No  . Sexual activity: Not on file  Lifestyle  . Physical activity    Days per week: Not on file    Minutes per session: Not on file  . Stress: Not on file  Relationships  . Social Herbalist on phone: Not on file    Gets together: Not on file    Attends religious service: Not on file    Active member of club or organization: Not on file    Attends meetings of clubs or organizations: Not on file    Relationship status: Not on file  . Intimate partner violence    Fear of current or ex partner: Not on file    Emotionally abused: Not on file  Physically abused: Not on file    Forced sexual activity: Not on file  Other Topics Concern  . Not on file  Social History Narrative   Lives with spouse.    FAMILY HISTORY: Family History  Problem Relation Age of Onset  . Diabetes Mother        type 2  . Heart disease Mother   . Hypertension Mother   . Coronary artery disease Father   . Heart attack Sister     ALLERGIES:  is allergic to sulfa antibiotics.  MEDICATIONS:  Current Outpatient Medications  Medication Sig Dispense Refill  . aspirin EC 81 MG tablet Take 81 mg by mouth daily.    . clonazePAM (KLONOPIN) 0.5 MG tablet TAKE 1 TABLET BY MOUTH ONCE DAILY AS NEEDED ANXIETY 30 tablet 5  . cyanocobalamin (,VITAMIN B-12,) 1000 MCG/ML injection Inject into the muscle.    . nortriptyline (PAMELOR) 25 MG capsule TAKE 1 CAPSULE BY MOUTH AT BEDTIME 90 capsule 1  . omeprazole (PRILOSEC) 40 MG  capsule TAKE 1 CAPSULE BY MOUTH TWICE DAILY 180 capsule 1  . sertraline (ZOLOFT) 100 MG tablet TAKE 1 TABLET BY MOUTH ONCE DAILY 90 tablet 1  . traZODone (DESYREL) 50 MG tablet Take 100 mg by mouth at bedtime.    . varenicline (CHANTIX STARTING MONTH PAK) 0.5 MG X 11 & 1 MG X 42 tablet Take one 0.5 mg tablet by mouth once daily for 3 days, then increase to one 0.5 mg tablet twice daily for 4 days, then increase to one 1 mg tablet twice daily. (Patient not taking: Reported on 06/01/2019) 53 tablet 0  . Vitamin D, Ergocalciferol, (DRISDOL) 1.25 MG (50000 UT) CAPS capsule Take by mouth.     No current facility-administered medications for this visit.      PHYSICAL EXAMINATION: ECOG PERFORMANCE STATUS: 1 - Symptomatic but completely ambulatory Vitals:   06/17/19 1203  BP: 131/72  Pulse: 94  Temp: (!) 97.3 F (36.3 C)   Filed Weights   06/17/19 1203  Weight: 103 lb 2 oz (46.8 kg)    Physical Exam Constitutional:      General: She is not in acute distress. HENT:     Head: Normocephalic and atraumatic.  Eyes:     General: No scleral icterus.    Pupils: Pupils are equal, round, and reactive to light.  Neck:     Musculoskeletal: Normal range of motion and neck supple.  Cardiovascular:     Rate and Rhythm: Normal rate and regular rhythm.     Heart sounds: Normal heart sounds.  Pulmonary:     Effort: Pulmonary effort is normal. No respiratory distress.     Breath sounds: No wheezing.  Abdominal:     General: Bowel sounds are normal. There is no distension.     Palpations: Abdomen is soft. There is no mass.     Tenderness: There is no abdominal tenderness.  Musculoskeletal: Normal range of motion.        General: No deformity.  Skin:    General: Skin is warm and dry.     Findings: No erythema or rash.  Neurological:     Mental Status: She is alert and oriented to person, place, and time.     Cranial Nerves: No cranial nerve deficit.     Coordination: Coordination normal.   Psychiatric:        Behavior: Behavior normal.        Thought Content: Thought content normal.  CMP Latest Ref Rng & Units 07/08/2018  Glucose 70 - 99 mg/dL 85  BUN 6 - 23 mg/dL 14  Creatinine 0.40 - 1.20 mg/dL 0.75  Sodium 135 - 145 mEq/L 139  Potassium 3.5 - 5.1 mEq/L 4.3  Chloride 96 - 112 mEq/L 107  CO2 19 - 32 mEq/L 23  Calcium 8.4 - 10.5 mg/dL 8.9  Total Protein 6.0 - 8.3 g/dL 6.4  Total Bilirubin 0.2 - 1.2 mg/dL 0.4  Alkaline Phos 39 - 117 U/L 46  AST 0 - 37 U/L 24  ALT 0 - 35 U/L 19   CBC Latest Ref Rng & Units 06/01/2019  WBC 4.0 - 10.5 K/uL 9.7  Hemoglobin 12.0 - 15.0 g/dL 12.7  Hematocrit 36.0 - 46.0 % 39.1  Platelets 150 - 400 K/uL 294     No results found.  LABORATORY DATA:  I have reviewed the data as listed Lab Results  Component Value Date   WBC 9.7 06/01/2019   HGB 12.7 06/01/2019   HCT 39.1 06/01/2019   MCV 99.5 06/01/2019   PLT 294 06/01/2019   Recent Labs    07/08/18 0907  NA 139  K 4.3  CL 107  CO2 23  GLUCOSE 85  BUN 14  CREATININE 0.75  CALCIUM 8.9  PROT 6.4  ALBUMIN 4.2  AST 24  ALT 19  ALKPHOS 46  BILITOT 0.4   Iron/TIBC/Ferritin/ %Sat    Component Value Date/Time   IRON 151 (H) 06/11/2017 1022   IRONPCTSAT 33.4 06/11/2017 1022        ASSESSMENT & PLAN:  1. Leukocytosis, unspecified type   2. Night sweat    #Labs are reviewed and discussed with patient. Mild chronic leukocytosis, predominantly lymphocytosis, likely reactive to chronic inflammation. ie Crohn's disease. Flow cytometry negative.  Normal LDH, normal uric acid. Recommend continue observation.  Hold additional work-up at this moment  Night sweats, etiology unknown.  Recommend close monitor. No other constitutional symptoms.  Advised patient to update me if new symptoms or progressively worsening of night sweats.   Orders Placed This Encounter  Procedures  . CBC with Differential/Platelet    Standing Status:   Future    Standing Expiration Date:    06/16/2020  . Comprehensive metabolic panel    Standing Status:   Future    Standing Expiration Date:   06/16/2020  . Lactate dehydrogenase    Standing Status:   Future    Standing Expiration Date:   06/16/2020    All questions were answered. The patient knows to call the clinic with any problems questions or concerns.  Return of visit:  1 year   Earlie Server, MD, PhD Hematology Oncology [facility] Pager- [phone] 06/17/2019

## 2019-07-08 ENCOUNTER — Telehealth: Payer: Self-pay | Admitting: Internal Medicine

## 2019-07-08 ENCOUNTER — Other Ambulatory Visit: Payer: Self-pay | Admitting: Internal Medicine

## 2019-07-08 MED ORDER — CLONAZEPAM 0.5 MG PO TABS: ORAL_TABLET | ORAL | 5 refills | Status: DC

## 2019-07-08 NOTE — Telephone Encounter (Signed)
The Trazadone being requested looks like it was discontinued due to a change in therapy.   Clonazepam   Refilled: 01/08/2019  Last OV: 04/19/2019 Next OV: 07/12/2019

## 2019-07-08 NOTE — Telephone Encounter (Signed)
Pt needs a refill on clonazePAM and traZODone sent to [facility] pt is out

## 2019-07-08 NOTE — Telephone Encounter (Signed)
Refills sent

## 2019-07-08 NOTE — Telephone Encounter (Signed)
Last refill on clonazepam:  01/08/19  Last OV:  04/19/19  Trazadone was orignal prescribed by a historical provider.

## 2019-07-09 ENCOUNTER — Other Ambulatory Visit: Payer: Self-pay | Admitting: Internal Medicine

## 2019-07-10 ENCOUNTER — Other Ambulatory Visit: Payer: Self-pay | Admitting: Internal Medicine

## 2019-07-11 MED ORDER — TRAZODONE HCL 50 MG PO TABS: 100.0000 mg | ORAL_TABLET | Freq: Every day | ORAL | 1 refills | Status: DC

## 2019-07-12 ENCOUNTER — Encounter: Payer: Self-pay | Admitting: Internal Medicine

## 2019-07-12 ENCOUNTER — Other Ambulatory Visit: Payer: Self-pay

## 2019-07-12 ENCOUNTER — Ambulatory Visit (INDEPENDENT_AMBULATORY_CARE_PROVIDER_SITE_OTHER): Payer: Medicare Other | Admitting: Internal Medicine

## 2019-07-12 ENCOUNTER — Ambulatory Visit: Payer: Medicare Other

## 2019-07-12 DIAGNOSIS — F409 Phobic anxiety disorder, unspecified: Secondary | ICD-10-CM

## 2019-07-12 DIAGNOSIS — F5105 Insomnia due to other mental disorder: Secondary | ICD-10-CM | POA: Diagnosis not present

## 2019-07-12 DIAGNOSIS — G43719 Chronic migraine without aura, intractable, without status migrainosus: Secondary | ICD-10-CM | POA: Diagnosis not present

## 2019-07-12 DIAGNOSIS — Z720 Tobacco use: Secondary | ICD-10-CM

## 2019-07-12 DIAGNOSIS — K50912 Crohn's disease, unspecified, with intestinal obstruction: Secondary | ICD-10-CM

## 2019-07-12 NOTE — Assessment & Plan Note (Signed)
Continue clonazepam and trazodone.  Sleep study results from June 2020 home study never received

## 2019-07-12 NOTE — Progress Notes (Signed)
Virtual Visit converted to Telephone  Note  This visit type was conducted due to national recommendations for restrictions regarding the COVID-19 pandemic (e.g. social distancing).  This format is felt to be most appropriate for this patient at this time.  All issues noted in this document were discussed and addressed.  No physical exam was performed (except for noted visual exam findings with Video Visits).   I attempted to connect  with@ on 07/12/19 at  9:00 AM EDT by video enabled telemedicine application ; however Interactive audio and video telecommunications  Ultimately failed, due to patient having technical difficulties.   We continued and completed visit with audio only. I and verified that I am speaking with the correct person using two identifiers. or telephone and verified that I am speaking with the correct person using two identifiers.  Location patient: home Location provider: work or home office Persons participating in the virtual visit: patient, provider  I discussed the limitations, risks, security and privacy concerns of performing an evaluation and management service by telephone and the availability of in person appointments. I also discussed with the patient that there may be a patient responsible charge related to this service. The patient expressed understanding and agreed to proceed.  Reason for visit: follow up  HPI:  The patient has no signs or symptoms of COVID 19 infection (fever, cough, sore throat  or shortness of breath beyond what is typical for patient).  Patient denies contact with other persons with the above mentioned symptoms or with anyone confirmed to have COVID 19. Staying busy at home .  Enjoying her solitude  With her 3 dogs.  mowing her 7 acres,  And painting the inside of her house.    Chronic insomnia:  Uses clonazepam and trazodone 100 mg .  Still not sleeping well,  Falls asleep but wakes up after one hour ,  Can't go back to sleep .  Sleep study  done in June but not resulted.    Referred by Dawson Bills for leukocytosis ,  Seen by hematology   Workup benign thus far  Migraines:  Has had 2 blocks,  Doesn't want to repeat them any more last one 2 months ago by Manuella Ghazi.    b12 injections done monthly by patient .   Vitamin D deficiency Taking extra vitamin D  Megadose weekly   Smoking.  Started chantix but stopped it while receiving the injections.  Wants  to restart chantix   Weight fluctuates by 1 lb or 2,  but relatively stable.    ROS: See pertinent positives and negatives per HPI.  Past Medical History:  Diagnosis Date  . Bowel obstruction ([place])    s/p laparotomy/ LOA 3/08, prior reversal of jejunal bypasss 2004  . Chronic abdominal pain   . COPD (chronic obstructive pulmonary disease) ([place])   . Crohn's disease ([place])   . Depression   . Headache   . History of DVT (deep vein thrombosis) 01/2007   post operative right leg, s/p vena cava filter  . Osteoporosis     Past Surgical History:  Procedure Laterality Date  . ABDOMINAL HYSTERECTOMY    . APPENDECTOMY    . CARPAL TUNNEL RELEASE    . CATARACT EXTRACTION    . CERVICAL FUSION  2005   C6-7, anterior approach  . CHOLECYSTECTOMY    . COLONOSCOPY  2015  . COLONOSCOPY WITH PROPOFOL N/A 05/10/2019   Procedure: COLONOSCOPY WITH PROPOFOL;  Surgeon: Manya Silvas, MD;  Location: [facility]  ENDOSCOPY;  Service: Endoscopy;  Laterality: N/A;  . ESOPHAGEAL MANOMETRY N/A 02/11/2018   Procedure: ESOPHAGEAL MANOMETRY (EM);  Surgeon: Lucilla Lame, MD;  Location: [facility] ENDOSCOPY;  Service: Endoscopy;  Laterality: N/A;  . ESOPHAGOGASTRODUODENOSCOPY (EGD) WITH PROPOFOL N/A 12/17/2017   Procedure: ESOPHAGOGASTRODUODENOSCOPY (EGD) WITH PROPOFOL;  Surgeon: Robert Bellow, MD;  Location: [facility] ENDOSCOPY;  Service: Endoscopy;  Laterality: N/A;  . ESOPHAGOGASTRODUODENOSCOPY (EGD) WITH PROPOFOL N/A 01/08/2018   Procedure: ESOPHAGOGASTRODUODENOSCOPY (EGD) WITH PROPOFOL;  Surgeon: Robert Bellow,  MD;  Location: [facility] ENDOSCOPY;  Service: Endoscopy;  Laterality: N/A;  . gyn surgery     hysterectomy  . LAPAROTOMY  01/30/07   for bowel obstruction with LOA  . LUMBAR LAMINECTOMY/DECOMPRESSION MICRODISCECTOMY N/A 12/25/2016   Procedure: LUMBAR DECOMPRESSION L4-5;  Surgeon: Meade Maw, MD;  Location: [facility] ORS;  Service: Neurosurgery;  Laterality: N/A;  . NISSEN FUNDOPLICATION    . ROTATOR CUFF REPAIR     right  . SPLENECTOMY     secondary to colonoscopy prep  . TONSILLECTOMY      Family History  Problem Relation Age of Onset  . Diabetes Mother        type 2  . Heart disease Mother   . Hypertension Mother   . Coronary artery disease Father   . Heart attack Sister     SOCIAL HX: widowed last year.  No alcohol.  Still smoking    Current Outpatient Medications:  .  clonazePAM (KLONOPIN) 0.5 MG tablet, TAKE 1 TABLET BY MOUTH ONCE DAILY AS NEEDED ANXIETY, Disp: 30 tablet, Rfl: 5 .  cyanocobalamin (,VITAMIN B-12,) 1000 MCG/ML injection, Inject into the muscle., Disp: , Rfl:  .  nortriptyline (PAMELOR) 25 MG capsule, TAKE 1 CAPSULE BY MOUTH AT BEDTIME, Disp: 90 capsule, Rfl: 1 .  omeprazole (PRILOSEC) 40 MG capsule, TAKE 1 CAPSULE BY MOUTH TWICE DAILY, Disp: 180 capsule, Rfl: 1 .  sertraline (ZOLOFT) 100 MG tablet, TAKE 1 TABLET BY MOUTH ONCE DAILY, Disp: 90 tablet, Rfl: 1 .  traZODone (DESYREL) 50 MG tablet, Take 2 tablets (100 mg total) by mouth at bedtime., Disp: 90 tablet, Rfl: 1 .  Vitamin D, Ergocalciferol, (DRISDOL) 1.25 MG (50000 UT) CAPS capsule, Take by mouth., Disp: , Rfl:  .  varenicline (CHANTIX STARTING MONTH PAK) 0.5 MG X 11 & 1 MG X 42 tablet, Take one 0.5 mg tablet by mouth once daily for 3 days, then increase to one 0.5 mg tablet twice daily for 4 days, then increase to one 1 mg tablet twice daily. (Patient not taking: Reported on 06/01/2019), Disp: 53 tablet, Rfl: 0  EXAM:  VITALS per patient if applicable:  GENERAL: alert, oriented, appears well and in no  acute distress  HEENT: atraumatic, conjunttiva clear, no obvious abnormalities on inspection of external nose and ears  NECK: normal movements of the head and neck  LUNGS: on inspection no signs of respiratory distress, breathing rate appears normal, no obvious gross SOB, gasping or wheezing  CV: no obvious cyanosis  MS: moves all visible extremities without noticeable abnormality  PSYCH/NEURO: pleasant and cooperative, no obvious depression or anxiety, speech and thought processing grossly intact  ASSESSMENT AND PLAN:  Discussed the following assessment and plan:  Crohn's disease of intestine, with intestinal obstruction (HCC)  Intractable chronic migraine without aura and without status migrainosus  Insomnia due to anxiety and fear  Tobacco abuse  Crohn's disease of intestine, with intestinal obstruction ([place]) Last colonoscopy was June 2020 and there were no sign of active disease.  seversl small polyps removed,  Internal hemorrhoids ([place])  Intractable chronic migraine without aura and without status migrainosus Managed by Neurology Manuella Ghazi) WITH injections,  Last one 2 months ago, and Pamelor qhs.   Insomnia due to anxiety and fear Continue clonazepam and trazodone.  Sleep study results from June 2020 home study never received   Tobacco abuse Advised to resume Chantix    I discussed the assessment and treatment plan with the patient. The patient was provided an opportunity to ask questions and all were answered. The patient agreed with the plan and demonstrated an understanding of the instructions.   The patient was advised to call back or seek an in-person evaluation if the symptoms worsen or if the condition fails to improve as anticipated.  I provided 25 minutes of non-face-to-face time during this encounter.   Crecencio Mc, MD

## 2019-07-12 NOTE — Telephone Encounter (Signed)
Patient aware.

## 2019-07-12 NOTE — Patient Instructions (Signed)
Take your trazodone earlier (at least one hour before bedtime)  Do not increase your clonazepam dose    We will get the results  of your sleep study

## 2019-07-12 NOTE — Assessment & Plan Note (Signed)
Advised to resume Chantix

## 2019-07-12 NOTE — Assessment & Plan Note (Addendum)
Managed by Neurology Manuella Ghazi) WITH injections,  Last one 2 months ago, and Pamelor qhs.

## 2019-07-12 NOTE — Assessment & Plan Note (Addendum)
Last colonoscopy was June 2020 and there were no sign of active disease.  seversl small polyps removed,  Internal hemorrhoids Vira Agar)

## 2019-07-16 ENCOUNTER — Other Ambulatory Visit: Payer: Self-pay

## 2019-07-16 ENCOUNTER — Ambulatory Visit (INDEPENDENT_AMBULATORY_CARE_PROVIDER_SITE_OTHER): Payer: Medicare Other | Admitting: *Deleted

## 2019-07-16 DIAGNOSIS — Z23 Encounter for immunization: Secondary | ICD-10-CM

## 2019-08-01 ENCOUNTER — Encounter: Payer: Self-pay | Admitting: Internal Medicine

## 2019-08-01 DIAGNOSIS — G4733 Obstructive sleep apnea (adult) (pediatric): Secondary | ICD-10-CM | POA: Insufficient documentation

## 2019-08-04 DIAGNOSIS — L570 Actinic keratosis: Secondary | ICD-10-CM | POA: Diagnosis not present

## 2019-08-04 DIAGNOSIS — L821 Other seborrheic keratosis: Secondary | ICD-10-CM | POA: Diagnosis not present

## 2019-09-07 ENCOUNTER — Other Ambulatory Visit: Payer: Self-pay | Admitting: Internal Medicine

## 2019-10-05 DIAGNOSIS — J018 Other acute sinusitis: Secondary | ICD-10-CM | POA: Diagnosis not present

## 2019-10-08 ENCOUNTER — Other Ambulatory Visit: Payer: Self-pay | Admitting: Internal Medicine

## 2019-10-14 DIAGNOSIS — J329 Chronic sinusitis, unspecified: Secondary | ICD-10-CM | POA: Diagnosis not present

## 2019-10-21 DIAGNOSIS — J329 Chronic sinusitis, unspecified: Secondary | ICD-10-CM | POA: Diagnosis not present

## 2019-11-08 ENCOUNTER — Other Ambulatory Visit: Payer: Self-pay | Admitting: Internal Medicine

## 2019-11-10 DIAGNOSIS — J0100 Acute maxillary sinusitis, unspecified: Secondary | ICD-10-CM | POA: Diagnosis not present

## 2019-11-14 ENCOUNTER — Encounter: Payer: Self-pay | Admitting: Oncology

## 2019-11-17 DIAGNOSIS — R519 Headache, unspecified: Secondary | ICD-10-CM | POA: Diagnosis not present

## 2019-11-17 DIAGNOSIS — J310 Chronic rhinitis: Secondary | ICD-10-CM | POA: Diagnosis not present

## 2019-11-24 ENCOUNTER — Other Ambulatory Visit: Payer: Self-pay | Admitting: Unknown Physician Specialty

## 2019-11-24 ENCOUNTER — Ambulatory Visit
Admission: RE | Admit: 2019-11-24 | Discharge: 2019-11-24 | Disposition: A | Discharge status: Home / Self Care | Payer: Medicare Other | Source: Ambulatory Visit | Attending: Unknown Physician Specialty | Admitting: Unknown Physician Specialty

## 2019-11-24 DIAGNOSIS — J019 Acute sinusitis, unspecified: Secondary | ICD-10-CM

## 2019-11-24 DIAGNOSIS — Z0389 Encounter for observation for other suspected diseases and conditions ruled out: Secondary | ICD-10-CM | POA: Diagnosis not present

## 2020-01-05 DIAGNOSIS — R519 Headache, unspecified: Secondary | ICD-10-CM | POA: Diagnosis not present

## 2020-01-05 DIAGNOSIS — J310 Chronic rhinitis: Secondary | ICD-10-CM | POA: Diagnosis not present

## 2020-01-05 MED ORDER — NORTRIPTYLINE HCL 25 MG PO CAPS: 25.0000 mg | ORAL_CAPSULE | Freq: Every day | ORAL | 1 refills | Status: DC

## 2020-01-05 MED ORDER — CLONAZEPAM 0.5 MG PO TABS: ORAL_TABLET | ORAL | 0 refills | Status: DC

## 2020-01-05 MED ORDER — SERTRALINE HCL 100 MG PO TABS: 100.0000 mg | ORAL_TABLET | Freq: Every day | ORAL | 1 refills | Status: DC

## 2020-01-05 NOTE — Telephone Encounter (Signed)
MyChart message sent.  OFFICE VISIT NEEDED prior to any more refills

## 2020-01-05 NOTE — Telephone Encounter (Signed)
Refilled: 07/08/2019 Last OV: 07/12/2019 Next OV: not scheduled

## 2020-01-12 DIAGNOSIS — R519 Headache, unspecified: Secondary | ICD-10-CM | POA: Diagnosis not present

## 2020-01-26 ENCOUNTER — Ambulatory Visit (INDEPENDENT_AMBULATORY_CARE_PROVIDER_SITE_OTHER): Payer: Medicare Other | Admitting: Internal Medicine

## 2020-01-26 ENCOUNTER — Other Ambulatory Visit: Payer: Self-pay

## 2020-01-26 ENCOUNTER — Ambulatory Visit (INDEPENDENT_AMBULATORY_CARE_PROVIDER_SITE_OTHER): Payer: Medicare Other

## 2020-01-26 ENCOUNTER — Encounter: Payer: Self-pay | Admitting: Internal Medicine

## 2020-01-26 VITALS — BP 110/70 | HR 70 | Temp 95.3°F | Resp 15 | Ht 59.0 in | Wt 99.0 lb

## 2020-01-26 DIAGNOSIS — R05 Cough: Secondary | ICD-10-CM

## 2020-01-26 DIAGNOSIS — M109 Gout, unspecified: Secondary | ICD-10-CM

## 2020-01-26 DIAGNOSIS — D751 Secondary polycythemia: Secondary | ICD-10-CM

## 2020-01-26 DIAGNOSIS — M818 Other osteoporosis without current pathological fracture: Secondary | ICD-10-CM

## 2020-01-26 DIAGNOSIS — E782 Mixed hyperlipidemia: Secondary | ICD-10-CM

## 2020-01-26 DIAGNOSIS — R053 Chronic cough: Secondary | ICD-10-CM

## 2020-01-26 DIAGNOSIS — E559 Vitamin D deficiency, unspecified: Secondary | ICD-10-CM

## 2020-01-26 DIAGNOSIS — D7589 Other specified diseases of blood and blood-forming organs: Secondary | ICD-10-CM

## 2020-01-26 DIAGNOSIS — R1310 Dysphagia, unspecified: Secondary | ICD-10-CM

## 2020-01-26 DIAGNOSIS — J418 Mixed simple and mucopurulent chronic bronchitis: Secondary | ICD-10-CM

## 2020-01-26 DIAGNOSIS — F409 Phobic anxiety disorder, unspecified: Secondary | ICD-10-CM

## 2020-01-26 DIAGNOSIS — G4733 Obstructive sleep apnea (adult) (pediatric): Secondary | ICD-10-CM

## 2020-01-26 DIAGNOSIS — D72829 Elevated white blood cell count, unspecified: Secondary | ICD-10-CM

## 2020-01-26 DIAGNOSIS — T380X5A Adverse effect of glucocorticoids and synthetic analogues, initial encounter: Secondary | ICD-10-CM

## 2020-01-26 DIAGNOSIS — E441 Mild protein-calorie malnutrition: Secondary | ICD-10-CM

## 2020-01-26 DIAGNOSIS — F5105 Insomnia due to other mental disorder: Secondary | ICD-10-CM

## 2020-01-26 DIAGNOSIS — R1319 Other dysphagia: Secondary | ICD-10-CM

## 2020-01-26 LAB — LIPID PANEL
Cholesterol: 188 mg/dL (ref 0–200)
HDL: 58.9 mg/dL (ref >39.00)
LDL Cholesterol: 92 mg/dL (ref 0–99)
NonHDL: 128.62
Total CHOL/HDL Ratio: 3
Triglycerides: 182 mg/dL — ABNORMAL HIGH (ref 0.0–149.0)
VLDL: 36.4 mg/dL (ref 0.0–40.0)

## 2020-01-26 LAB — CBC WITH DIFFERENTIAL/PLATELET
Basophils Absolute: 0.1 10*3/uL (ref 0.0–0.1)
Basophils Relative: 0.7 % (ref 0.0–3.0)
Eosinophils Absolute: 0.1 10*3/uL (ref 0.0–0.7)
Eosinophils Relative: 0.7 % (ref 0.0–5.0)
HCT: 40.3 % (ref 36.0–46.0)
Hemoglobin: 13.6 g/dL (ref 12.0–15.0)
Lymphocytes Relative: 45.4 % (ref 12.0–46.0)
Lymphs Abs: 3.9 10*3/uL (ref 0.7–4.0)
MCHC: 33.8 g/dL (ref 30.0–36.0)
MCV: 102 fl — ABNORMAL HIGH (ref 78.0–100.0)
Monocytes Absolute: 0.8 10*3/uL (ref 0.1–1.0)
Monocytes Relative: 9.7 % (ref 3.0–12.0)
Neutro Abs: 3.7 10*3/uL (ref 1.4–7.7)
Neutrophils Relative %: 43.5 % (ref 43.0–77.0)
Platelets: 263 10*3/uL (ref 150.0–400.0)
RBC: 3.96 Mil/uL (ref 3.87–5.11)
RDW: 13.6 % (ref 11.5–15.5)
WBC: 8.6 10*3/uL (ref 4.0–10.5)

## 2020-01-26 LAB — COMPREHENSIVE METABOLIC PANEL
ALT: 13 U/L (ref 0–35)
AST: 18 U/L (ref 0–37)
Albumin: 4.2 g/dL (ref 3.5–5.2)
Alkaline Phosphatase: 36 U/L — ABNORMAL LOW (ref 39–117)
BUN: 11 mg/dL (ref 6–23)
CO2: 25 mEq/L (ref 19–32)
Calcium: 9.2 mg/dL (ref 8.4–10.5)
Chloride: 101 mEq/L (ref 96–112)
Creatinine, Ser: 0.73 mg/dL (ref 0.40–1.20)
GFR: 78.32 mL/min (ref >60.00)
Glucose, Bld: 94 mg/dL (ref 70–99)
Potassium: 4.2 mEq/L (ref 3.5–5.1)
Sodium: 136 mEq/L (ref 135–145)
Total Bilirubin: 0.4 mg/dL (ref 0.2–1.2)
Total Protein: 6.2 g/dL (ref 6.0–8.3)

## 2020-01-26 LAB — VITAMIN D 25 HYDROXY (VIT D DEFICIENCY, FRACTURES): VITD: 31.17 ng/mL (ref 30.00–100.00)

## 2020-01-26 MED ORDER — CLONAZEPAM 0.5 MG PO TABS: ORAL_TABLET | ORAL | 5 refills | Status: DC

## 2020-01-26 NOTE — Patient Instructions (Signed)
Chronic Bronchitis, Adult Chronic bronchitis is long-lasting inflammation of the tubes that carry air into your lungs (bronchial tubes). This is inflammation that occurs:  On most days of the week.  For at least three months at a time.  Over a period of two years in a row. When the bronchial tubes are inflamed, they start to produce mucus. The inflammation and buildup of mucus make it more difficult to breathe. Chronic bronchitis is usually a permanent problem. It is one type of chronic obstructive pulmonary disease (COPD). People with chronic bronchitis are more likely to get frequent colds or respiratory infections. What are the causes? Chronic bronchitis most often occurs in people who:  Have chronic, severe asthma.  Have a history of smoking.  Have asthma and smoke.  Have certain lung diseases.  Have had long-term exposure to certain irritating fumes or chemicals. What are the signs or symptoms? Symptoms of chronic bronchitis may include:  A cough that brings up mucus (productive cough).  Shortness of breath.  Loud breathing (wheezing).  Chest discomfort.  Frequent (recurring) colds or respiratory infections. Certain things can trigger chronic bronchitis symptoms or make them worse, such as:  Infections.  Stopping certain medicines.  Smoking.  Exposure to chemicals. How is this diagnosed? This condition may be diagnosed based on:  Your symptoms and medical history.  A physical exam.  A chest X-ray.  Lung (pulmonary) function tests. How is this treated? There is no cure for chronic bronchitis, but treatment can help control your symptoms. Treatment may include:  Using a cool mist vaporizer or humidifier to make it easier to breathe.  Drinking more fluids. Drinking more makes your mucus thinner, which may make it easier to breathe.  Lifestyle changes, such as eating a healthier diet and getting more exercise.  Medicines, such as: ? Inhalers to improve  air flow in and out of your lungs. ? Antibiotics to treat any bacterial infections you have, such as:  Lung infection (pneumonia).  Sinus infection.  A sudden, severe (acute) episode of bronchitis.  Oxygen therapy.  Preventing infections by keeping up to date on vaccinations, including the pneumonia and flu vaccines.  Pulmonary rehabilitation. This is a program that helps you manage your breathing problems and improve your quality of life. It may last for up to 4-12 weeks and may include exercise programs, education, counseling, and treatment support. Follow these instructions at home: Medicines  Take over-the-counter and prescription medicines only as told by your health care provider.  If you were prescribed an antibiotic medicine, take it as told by your health care provider. Do not stop taking the antibiotic even if you start to feel better. Preventing infections  Get vaccinations as told by your health care provider. Make sure you get a flu shot (influenza vaccine) every year.  Wash your hands often with soap and water. If soap and water are not available, use hand sanitizer.  Avoid contact with people who have symptoms of a cold or the flu. Managing symptoms   Do not smoke, and avoid secondhand smoke. Exposure to cigarette smoke or irritating chemicals will make bronchitis worse. If you smoke and you need help quitting, ask your health care provider. Quitting smoking will help your lungs heal faster.  Use an inhaler, cool mist vaporizer, or humidifier as told by your health care provider.  Avoid pollen, dust, animal dander, molds, smoke, and other things that cause shortness of breath or wheezing attacks.  Use oxygen therapy at home as directed. Follow  instructions from your health care provider about how to use oxygen safely and take precautions to prevent fire. Make sure you never smoke while using oxygen or allow others to smoke in your home.  Do not wait to get medical  care if you have any concerning symptoms or trouble breathing. Waiting could cause permanent injury and may be life threatening. General instructions  Talk with your health care provider about what activities are safe for you and about possible exercise routines. Regular exercise is very important to help you feel better.  Drink enough fluids to keep your urine pale yellow.  Keep all follow-up visits as told by your health care provider. This is important. Contact a health care provider if:  You have coughing or shortness of breath that gets worse.  You have muscle aches.  You have chest pain.  Your mucus seems to get thicker.  Your mucus changes from clear or white to yellow, green, gray, or bloody. Get help right away if:  Your usual medicines do not stop your wheezing.  You have severe difficulty breathing. These symptoms may represent a serious problem that is an emergency. Do not wait to see if the symptoms will go away. Get medical help right away. Call your local emergency services (911 in the [place] Do not drive yourself to the hospital. Summary  Chronic bronchitis is long-lasting inflammation of the tubes that carry air into your lungs (bronchial tubes).  Chronic bronchitis is usually a permanent problem. It is one type of chronic obstructive pulmonary disease (COPD).  There is no cure for chronic bronchitis, but treatment can help control your symptoms.  Do not smoke, and avoid secondhand smoke. Exposure to cigarette smoke or irritating chemicals will make bronchitis worse. This information is not intended to replace advice given to you by your health care provider. Make sure you discuss any questions you have with your health care provider. Document Revised: 08/20/2018 Document Reviewed: 09/17/2017 Elsevier Patient Education  [place].

## 2020-01-26 NOTE — Progress Notes (Signed)
Subjective:  Patient ID: Tamara [name], female    DOB: 06-19-1946  Age: 74 y.o. MRN: [id number]  CC: The primary encounter diagnosis was Chronic cough. Diagnoses of Vitamin D deficiency, Uric acid arthropathy, Leukocytosis, unspecified type, Moderate mixed hyperlipidemia not requiring statin therapy, Steroid-induced osteoporosis, Polycythemia, secondary, Esophageal dysphagia, Macrocytosis, Insomnia due to anxiety and fear, Mild protein-calorie malnutrition ([place]), and OSA (obstructive sleep apnea) were also pertinent to this visit.  HPI LAPORSCHE HOEGER presents for follow up on chronic issues  This visit occurred during the SARS-CoV-2 public health emergency.  Safety protocols were in place, including screening questions prior to the visit, additional usage of staff PPE, and extensive cleaning of exam room while observing appropriate contact time as indicated for disinfecting solutions.     Patient has received both doses of the available COVID 19 vaccine without complications.  Patient continues to mask when outside of the home except when walking in yard or at safe distances from others .  Patient denies any change in mood or development of unhealthy behaviors resuting from the pandemic's restriction of activities and socialization.    Headaches : chronic Lstill occurring 3 -4 times per week. Not as severe.  Had a trigeminal block in July by Dr.  Manuella Ghazi  does't want to have any more due to adverse effects afterward (felt dizzy, had sinus problems afterward requiring ENT evaluation . CT sinuses was negative.  Advised to continue Flushing with saline per Tami Ribas) . N Osteoporosis :  Stopped Prolia (by endocrinology) due to cost.  Sees Endocrine next month.  Still smoking,  History of blood clots .  Hiatal hernia repair  .  Options limited to Reclast.,    Wt loss of 4 lbs,  Not sure why ,  "I eat like a horse"  seeing heme onc for persistent leukocytosis   Tobacco abuse Still smoking a pack  daily.  Widowed in 2017. Not sad or lonely.  3 dogs , (schitzu female and femal eand puppy) raises /breeds them for $$$   Chronic cough. Feels it is sinus related,  Has not had a  recent chest x ray   No feverss  Body aches.    GAD:  Managed with zoloft and clonazepam.  No panic attacks. Widowed last year after many years of caring for an infirm husband. Reports less stress now,    Lives with her dogs.   Outpatient Medications Prior to Visit  Medication Sig Dispense Refill  . cyanocobalamin (,VITAMIN B-12,) 1000 MCG/ML injection Inject into the muscle.    . nortriptyline (PAMELOR) 25 MG capsule Take 1 capsule (25 mg total) by mouth at bedtime. 90 capsule 1  . omeprazole (PRILOSEC) 40 MG capsule TAKE 1 CAPSULE BY MOUTH TWICE DAILY 180 capsule 1  . sertraline (ZOLOFT) 100 MG tablet Take 1 tablet (100 mg total) by mouth daily. 90 tablet 1  . traZODone (DESYREL) 50 MG tablet TAKE 2 TABLETS BY MOUTH AT BEDTIME 180 tablet 1  . Vitamin D, Ergocalciferol, (DRISDOL) 1.25 MG (50000 UT) CAPS capsule Take by mouth.    . clonazePAM (KLONOPIN) 0.5 MG tablet TAKE 1 TABLET BY MOUTH ONCE DAILY AS NEEDED ANXIETY 30 tablet 0  . varenicline (CHANTIX STARTING MONTH PAK) 0.5 MG X 11 & 1 MG X 42 tablet Take one 0.5 mg tablet by mouth once daily for 3 days, then increase to one 0.5 mg tablet twice daily for 4 days, then increase to one 1 mg tablet twice daily. (Patient not  taking: Reported on 01/26/2020) 53 tablet 0   No facility-administered medications prior to visit.    Review of Systems;  Patient denies headache, fevers, malaise, unintentional weight loss, skin rash, eye pain, sinus congestion and sinus pain, sore throat, dysphagia,  hemoptysis ,, dyspnea, wheezing, chest pain, palpitations, orthopnea, edema, abdominal pain, nausea, melena, diarrhea, constipation, flank pain, dysuria, hematuria, urinary  Frequency, nocturia, numbness, tingling, seizures,  Focal weakness, Loss of consciousness,  Tremor, insomnia,  depression, , and suicidal ideation.      Objective:  BP 110/70 (BP Location: Left Arm, Patient Position: Sitting, Cuff Size: Normal)   Pulse 70   Temp (!) 95.3 F (35.2 C) (Temporal)   Resp 15   Ht 4' 11"  (1.499 m)   Wt 99 lb (44.9 kg)   SpO2 99%   BMI 20.00 kg/m   BP Readings from Last 3 Encounters:  01/26/20 110/70  06/17/19 131/72  06/01/19 124/69    Wt Readings from Last 3 Encounters:  01/26/20 99 lb (44.9 kg)  07/12/19 103 lb (46.7 kg)  06/17/19 103 lb 2 oz (46.8 kg)    General appearance: alert, cooperative and appears stated age Ears: normal TM's and external ear canals both ears Throat: lips, mucosa, and tongue normal; teeth and gums normal Neck: no adenopathy, no carotid bruit, supple, symmetrical, trachea midline and thyroid not enlarged, symmetric, no tenderness/mass/nodules Back: symmetric, no curvature. ROM normal. No CVA tenderness. Lungs: clear to auscultation bilaterally Heart: regular rate and rhythm, S1, S2 normal, no murmur, click, rub or gallop Abdomen: soft, non-tender; bowel sounds normal; no masses,  no organomegaly Pulses: 2+ and symmetric Skin: Skin color, texture, turgor normal. No rashes or lesions Lymph nodes: Cervical, supraclavicular, and axillary nodes normal.  No results found for: HGBA1C  Lab Results  Component Value Date   CREATININE 0.73 01/26/2020   CREATININE 0.75 07/08/2018   CREATININE 0.73 04/06/2018    Lab Results  Component Value Date   WBC 8.6 01/26/2020   HGB 13.6 01/26/2020   HCT 40.3 01/26/2020   PLT 263.0 01/26/2020   GLUCOSE 94 01/26/2020   CHOL 188 01/26/2020   TRIG 182.0 (H) 01/26/2020   HDL 58.90 01/26/2020   LDLDIRECT 121.1 01/26/2014   LDLCALC 92 01/26/2020   ALT 13 01/26/2020   AST 18 01/26/2020   NA 136 01/26/2020   K 4.2 01/26/2020   CL 101 01/26/2020   CREATININE 0.73 01/26/2020   BUN 11 01/26/2020   CO2 25 01/26/2020   TSH 1.74 06/11/2017   INR 0.85 12/18/2016    DG Sinuses  Complete  Result Date: 11/24/2019 CLINICAL DATA:  Evaluate for sinusitis. EXAM: PARANASAL SINUSES - COMPLETE 3 + VIEW COMPARISON:  08/26/2016 FINDINGS: No fluid in the paranasal sinuses. No significant mucosal thickening. No osseous destruction. IMPRESSION: No acute findings. Electronically Signed   By: Abigail Miyamoto M.D.   On: 11/24/2019 15:12    Assessment & Plan:   Problem List Items Addressed This Visit      Unprioritized   Steroid-induced osteoporosis    Given her smoking (ongoing) and history of hiatal hernia repair, Her options appear to be limited to Reclast given failure of Prolia due to cost. She sees endocrinology next week.  Reviewed calcium and vitamin D needs.        Uric acid arthropathy   Vitamin D deficiency   Relevant Orders   VITAMIN D 25 Hydroxy (Vit-D Deficiency, Fractures) (Completed)   Polycythemia, secondary    Secondary to tobacco abuse  However she also has chronic mild leukocytosis . Follow up with [facility]   Lab Results  Component Value Date   WBC 8.6 01/26/2020   HGB 13.6 01/26/2020   HCT 40.3 01/26/2020   MCV 102.0 (H) 01/26/2020   PLT 263.0 01/26/2020         Macrocytosis    Treating for b12 deficiency with IM monthly injections   Lab Results  Component Value Date   VITAMINB12 323 12/02/2017         Insomnia due to anxiety and fear    Continue clonazepam and trazodone.        Malnutrition ([place])     I have reviewed her diet and recommended that she increase her protein and fat intake while monitoring her carbohydrates.       OSA (obstructive sleep apnea)    Diagnosed by prior sleep study. Patient is using CPAP every night a minimum of 6 hours per night and notes improved daytime wakefulness and decreased fatigue       RESOLVED: Dysphagia    Resolved after last takedown surgery OF HIATAL HERNIA .  She is unable to [facility] a health weight despite reporting a good appetite.        Other Visit Diagnoses    Chronic cough    -   Primary   Relevant Orders   DG Chest 2 View (Completed)   Leukocytosis, unspecified type       Relevant Orders   Comprehensive metabolic panel (Completed)   CBC with Differential/Platelet (Completed)   Moderate mixed hyperlipidemia not requiring statin therapy       Relevant Orders   Lipid panel (Completed)      I am having Ayerim R. Gong maintain her varenicline, cyanocobalamin, Vitamin D (Ergocalciferol), traZODone, omeprazole, nortriptyline, sertraline, and clonazePAM.  Meds ordered this encounter  Medications  . clonazePAM (KLONOPIN) 0.5 MG tablet    Sig: TAKE 1 TABLET BY MOUTH ONCE DAILY AS NEEDED ANXIETY    Dispense:  30 tablet    Refill:  5    Medications Discontinued During This Encounter  Medication Reason  . clonazePAM (KLONOPIN) 0.5 MG tablet Reorder   I provided  30 minutes of face-to-face time during this encounter reviewing patient's current problems and past surgeries, labs and imaging studies, providing counseling on the above mentioned problems , and coordination  of care .  Follow-up: No follow-ups on file.   Crecencio Mc, MD

## 2020-01-28 ENCOUNTER — Encounter: Payer: Self-pay | Admitting: Internal Medicine

## 2020-01-28 DIAGNOSIS — J449 Chronic obstructive pulmonary disease, unspecified: Secondary | ICD-10-CM | POA: Insufficient documentation

## 2020-01-28 DIAGNOSIS — J42 Unspecified chronic bronchitis: Secondary | ICD-10-CM | POA: Insufficient documentation

## 2020-01-28 NOTE — Assessment & Plan Note (Addendum)
Resolved after last takedown surgery OF HIATAL HERNIA .  She is unable to [facility] a health weight despite reporting a good appetite.

## 2020-01-28 NOTE — Assessment & Plan Note (Signed)
Diagnosed by prior sleep study. Patient is using CPAP every night a minimum of 6 hours per night and notes improved daytime wakefulness and decreased fatigue  

## 2020-01-28 NOTE — Assessment & Plan Note (Signed)
I have reviewed her diet and recommended that she increase her protein and fat intake while monitoring her carbohydrates.

## 2020-01-28 NOTE — Assessment & Plan Note (Addendum)
Suggested by history of persistent productive cough without fevers or body aches and  ongoing tobacco abuse.  Chest x ray was negative today for pneumonia.   Will recommend trial of spiriva or atrovent

## 2020-01-28 NOTE — Assessment & Plan Note (Addendum)
Treating for b12 deficiency with IM monthly injections   Lab Results  Component Value Date   VITAMINB12 323 12/02/2017

## 2020-01-28 NOTE — Assessment & Plan Note (Addendum)
Continue clonazepam and trazodone.

## 2020-01-28 NOTE — Assessment & Plan Note (Signed)
Given her smoking (ongoing) and history of hiatal hernia repair, Her options appear to be limited to Reclast given failure of Prolia due to cost. She sees endocrinology next week.  Reviewed calcium and vitamin D needs.

## 2020-01-28 NOTE — Assessment & Plan Note (Signed)
Secondary to tobacco abuse  However she also has chronic mild leukocytosis . Follow up with [facility]   Lab Results  Component Value Date   WBC 8.6 01/26/2020   HGB 13.6 01/26/2020   HCT 40.3 01/26/2020   MCV 102.0 (H) 01/26/2020   PLT 263.0 01/26/2020

## 2020-01-30 DIAGNOSIS — J209 Acute bronchitis, unspecified: Secondary | ICD-10-CM | POA: Diagnosis not present

## 2020-02-08 DIAGNOSIS — R070 Pain in throat: Secondary | ICD-10-CM | POA: Diagnosis not present

## 2020-02-08 DIAGNOSIS — J301 Allergic rhinitis due to pollen: Secondary | ICD-10-CM | POA: Diagnosis not present

## 2020-04-07 ENCOUNTER — Other Ambulatory Visit: Payer: Self-pay | Admitting: Internal Medicine

## 2020-04-19 DIAGNOSIS — R519 Headache, unspecified: Secondary | ICD-10-CM | POA: Diagnosis not present

## 2020-04-19 DIAGNOSIS — J310 Chronic rhinitis: Secondary | ICD-10-CM | POA: Diagnosis not present

## 2020-05-03 DIAGNOSIS — S30861A Insect bite (nonvenomous) of abdominal wall, initial encounter: Secondary | ICD-10-CM | POA: Diagnosis not present

## 2020-05-17 DIAGNOSIS — M1712 Unilateral primary osteoarthritis, left knee: Secondary | ICD-10-CM | POA: Diagnosis not present

## 2020-05-23 ENCOUNTER — Other Ambulatory Visit: Payer: Self-pay | Admitting: Physician Assistant

## 2020-05-23 DIAGNOSIS — M2392 Unspecified internal derangement of left knee: Secondary | ICD-10-CM

## 2020-05-27 ENCOUNTER — Other Ambulatory Visit: Payer: Self-pay

## 2020-05-27 ENCOUNTER — Ambulatory Visit
Admission: RE | Admit: 2020-05-27 | Discharge: 2020-05-27 | Disposition: A | Discharge status: Home / Self Care | Payer: Medicare Other | Source: Ambulatory Visit | Attending: Physician Assistant | Admitting: Physician Assistant

## 2020-05-27 DIAGNOSIS — M2392 Unspecified internal derangement of left knee: Secondary | ICD-10-CM | POA: Diagnosis not present

## 2020-05-27 DIAGNOSIS — M25562 Pain in left knee: Secondary | ICD-10-CM | POA: Diagnosis not present

## 2020-06-05 ENCOUNTER — Other Ambulatory Visit: Payer: Self-pay | Admitting: Internal Medicine

## 2020-06-07 ENCOUNTER — Ambulatory Visit: Payer: Medicare Other

## 2020-06-09 ENCOUNTER — Other Ambulatory Visit: Payer: Self-pay

## 2020-06-09 DIAGNOSIS — D72829 Elevated white blood cell count, unspecified: Secondary | ICD-10-CM

## 2020-06-12 ENCOUNTER — Encounter: Payer: Self-pay | Admitting: Oncology

## 2020-06-12 ENCOUNTER — Inpatient Hospital Stay: Payer: Medicare Other | Attending: Oncology

## 2020-06-12 ENCOUNTER — Inpatient Hospital Stay (HOSPITAL_BASED_OUTPATIENT_CLINIC_OR_DEPARTMENT_OTHER): Payer: Medicare Other | Admitting: Oncology

## 2020-06-12 ENCOUNTER — Other Ambulatory Visit: Payer: Self-pay

## 2020-06-12 ENCOUNTER — Telehealth: Payer: Self-pay | Admitting: *Deleted

## 2020-06-12 VITALS — BP 111/80 | HR 79 | Temp 96.1°F | Resp 18 | Wt 95.4 lb

## 2020-06-12 DIAGNOSIS — D72829 Elevated white blood cell count, unspecified: Secondary | ICD-10-CM | POA: Diagnosis not present

## 2020-06-12 DIAGNOSIS — F17210 Nicotine dependence, cigarettes, uncomplicated: Secondary | ICD-10-CM | POA: Diagnosis not present

## 2020-06-12 DIAGNOSIS — Z720 Tobacco use: Secondary | ICD-10-CM

## 2020-06-12 DIAGNOSIS — Z79899 Other long term (current) drug therapy: Secondary | ICD-10-CM | POA: Insufficient documentation

## 2020-06-12 DIAGNOSIS — K5090 Crohn's disease, unspecified, without complications: Secondary | ICD-10-CM | POA: Insufficient documentation

## 2020-06-12 DIAGNOSIS — R61 Generalized hyperhidrosis: Secondary | ICD-10-CM

## 2020-06-12 DIAGNOSIS — Z8719 Personal history of other diseases of the digestive system: Secondary | ICD-10-CM

## 2020-06-12 DIAGNOSIS — R634 Abnormal weight loss: Secondary | ICD-10-CM | POA: Diagnosis not present

## 2020-06-12 DIAGNOSIS — Z87891 Personal history of nicotine dependence: Secondary | ICD-10-CM

## 2020-06-12 DIAGNOSIS — Z86718 Personal history of other venous thrombosis and embolism: Secondary | ICD-10-CM | POA: Diagnosis not present

## 2020-06-12 DIAGNOSIS — J449 Chronic obstructive pulmonary disease, unspecified: Secondary | ICD-10-CM | POA: Insufficient documentation

## 2020-06-12 DIAGNOSIS — Z122 Encounter for screening for malignant neoplasm of respiratory organs: Secondary | ICD-10-CM

## 2020-06-12 DIAGNOSIS — F329 Major depressive disorder, single episode, unspecified: Secondary | ICD-10-CM | POA: Insufficient documentation

## 2020-06-12 LAB — COMPREHENSIVE METABOLIC PANEL
ALT: 19 U/L (ref 0–44)
AST: 21 U/L (ref 15–41)
Albumin: 4.5 g/dL (ref 3.5–5.0)
Alkaline Phosphatase: 49 U/L (ref 38–126)
Anion gap: 9 (ref 5–15)
BUN: 10 mg/dL (ref 8–23)
CO2: 23 mmol/L (ref 22–32)
Calcium: 8.9 mg/dL (ref 8.9–10.3)
Chloride: 105 mmol/L (ref 98–111)
Creatinine, Ser: 0.73 mg/dL (ref 0.44–1.00)
GFR calc Af Amer: >60 mL/min (ref >60)
GFR calc non Af Amer: >60 mL/min (ref >60)
Glucose, Bld: 104 mg/dL — ABNORMAL HIGH (ref 70–99)
Potassium: 4.1 mmol/L (ref 3.5–5.1)
Sodium: 137 mmol/L (ref 135–145)
Total Bilirubin: 0.9 mg/dL (ref 0.3–1.2)
Total Protein: 6.6 g/dL (ref 6.5–8.1)

## 2020-06-12 LAB — CBC WITH DIFFERENTIAL/PLATELET
Abs Immature Granulocytes: 0.04 10*3/uL (ref 0.00–0.07)
Basophils Absolute: 0.1 10*3/uL (ref 0.0–0.1)
Basophils Relative: 1 %
Eosinophils Absolute: 0.1 10*3/uL (ref 0.0–0.5)
Eosinophils Relative: 1 %
HCT: 39.5 % (ref 36.0–46.0)
Hemoglobin: 13.6 g/dL (ref 12.0–15.0)
Immature Granulocytes: 0 %
Lymphocytes Relative: 43 %
Lymphs Abs: 4.4 10*3/uL — ABNORMAL HIGH (ref 0.7–4.0)
MCH: 34.4 pg — ABNORMAL HIGH (ref 26.0–34.0)
MCHC: 34.4 g/dL (ref 30.0–36.0)
MCV: 100 fL (ref 80.0–100.0)
Monocytes Absolute: 0.9 10*3/uL (ref 0.1–1.0)
Monocytes Relative: 9 %
Neutro Abs: 4.7 10*3/uL (ref 1.7–7.7)
Neutrophils Relative %: 46 %
Platelets: 263 10*3/uL (ref 150–400)
RBC: 3.95 MIL/uL (ref 3.87–5.11)
RDW: 13.9 % (ref 11.5–15.5)
WBC: 10.2 10*3/uL (ref 4.0–10.5)
nRBC: 0 % (ref 0.0–0.2)

## 2020-06-12 LAB — LACTATE DEHYDROGENASE: LDH: 148 U/L (ref 98–192)

## 2020-06-12 NOTE — Progress Notes (Signed)
Hematology/Oncology [facility] [phone] [phone]   Patient Care Team: Crecencio Mc, MD as PCP - General (Internal Medicine)  REFERRING PROVIDER: Crecencio Mc, MD  CHIEF COMPLAINTS/REASON FOR VISIT:  Follow-up for leukocytosis  HISTORY OF PRESENTING ILLNESS:  Tamara [name] is a  74 y.o.  female with PMH listed below who was seen at the request of Crecencio Mc, MD/Tullo, Aris Everts, MD for evaluation of leukocytosis.   Reviewed patient' recent labs ordered by [facility] gastroenterology group via care everywhere. 05/26/2019 CBC showed elevated white count of 13.3, predominately lymphocytosis with increased lymphocyte percentage 50.6, absolute lymphocyte 6.73. A pathology review was done and the result was not available to me. Other studies includes iron panel showed iron saturation 20.  Ferritin 11 Nonremarkable CMP, TSH 2.23 Previous lab records reviewed. Leukocytosis onset of chronic, duration is since  Vitamin D level 21.5, CRP< 1 Vitamin B12 level was low.  Patient started B12 injections. Patient reports that she has been chronically having high white blood cell count.  Recalled that she had work-up over a decade ago.  Details unclear. Patient has a history of gastroesophageal reflux disease, history of Crohn's disease of the small intestine diagnosed with capsule endoscopy in 2005.  History of bowel obstructions.  She has been in remission since 2016 currently off any Crohn's disease medication.  Most recent colonoscopy 05/10/2019 by Dr. Vira Agar showed polyps in the descending colon and the rectum which were removed.  Pathology showed sessile serrated polyp and hyperplastic polyp.  Negative for dysplasia, malignancy.   No aggravating or elevated factors. Associated symptoms or signs:  Denies weight loss, fever, chills,night sweats.  Denies any fatigue. Smoking history: Current daily smoker, 50-pack-year smoking  history. History of recent oral steroid use or steroid injection:  History of recent infection: Denies any recent infection. Autoimmune disease history.  Crohn's disease  Today she reports feeling well at baseline.  No fever, chills, unintentional weight loss.  Appetite is good. Reports having sweats at night for a few months. Chronic epigastric discomfort.  INTERVAL HISTORY Tamara [name] is a 74 y.o. female who has above history reviewed by me today presents for follow up visit for management of leukocytosis Problems and complaints are listed below: Patient has lost follow-up with gastroenterology for her history of Crohn's disease. She continues to have unexplained weight loss.  Lost 8 pounds since a year ago and lost 4 pounds since 4 months ago.  She reports having normal appetite.  Denies any particular bowel complaints.  Night sweats, she reports that the night sweats has improved compared to her symptoms during her last visit but still intermittently have the symptoms. Patient is current everyday smoker, 50-pack-year smoking history.  Denies any cough, shortness of breath.   Review of Systems  Constitutional: Positive for unexpected weight change. Negative for appetite change, chills, fatigue and fever.  HENT:   Negative for hearing loss and voice change.   Eyes: Negative for eye problems.  Respiratory: Negative for chest tightness and cough.   Cardiovascular: Negative for chest pain.  Gastrointestinal: Negative for abdominal distention, abdominal pain and blood in stool.       Chronic epigastric discomfort/acid reflux  Endocrine: Negative for hot flashes.  Genitourinary: Negative for difficulty urinating and frequency.   Musculoskeletal: Negative for arthralgias.  Skin: Negative for itching and rash.  Neurological: Negative for extremity weakness.  Hematological: Negative for adenopathy.  Psychiatric/Behavioral: Negative for confusion.     MEDICAL HISTORY:  Past  Medical History:  Diagnosis Date  . Bowel obstruction ([place])    s/p laparotomy/ LOA 3/08, prior reversal of jejunal bypasss 2004  . Chronic abdominal pain   . COPD (chronic obstructive pulmonary disease) ([place])   . Crohn's disease ([place])   . Depression   . Dysphagia 12/04/2017  . Headache   . History of DVT (deep vein thrombosis) 01/2007   post operative right leg, s/p vena cava filter  . Osteoporosis     SURGICAL HISTORY: Past Surgical History:  Procedure Laterality Date  . ABDOMINAL HYSTERECTOMY    . APPENDECTOMY    . CARPAL TUNNEL RELEASE    . CATARACT EXTRACTION    . CERVICAL FUSION  2005   C6-7, anterior approach  . CHOLECYSTECTOMY    . COLONOSCOPY  2015  . COLONOSCOPY WITH PROPOFOL N/A 05/10/2019   Procedure: COLONOSCOPY WITH PROPOFOL;  Surgeon: Manya Silvas, MD;  Location: [facility] ENDOSCOPY;  Service: Endoscopy;  Laterality: N/A;  . ESOPHAGEAL MANOMETRY N/A 02/11/2018   Procedure: ESOPHAGEAL MANOMETRY (EM);  Surgeon: Lucilla Lame, MD;  Location: [facility] ENDOSCOPY;  Service: Endoscopy;  Laterality: N/A;  . ESOPHAGOGASTRODUODENOSCOPY (EGD) WITH PROPOFOL N/A 12/17/2017   Procedure: ESOPHAGOGASTRODUODENOSCOPY (EGD) WITH PROPOFOL;  Surgeon: Robert Bellow, MD;  Location: [facility] ENDOSCOPY;  Service: Endoscopy;  Laterality: N/A;  . ESOPHAGOGASTRODUODENOSCOPY (EGD) WITH PROPOFOL N/A 01/08/2018   Procedure: ESOPHAGOGASTRODUODENOSCOPY (EGD) WITH PROPOFOL;  Surgeon: Robert Bellow, MD;  Location: [facility] ENDOSCOPY;  Service: Endoscopy;  Laterality: N/A;  . gyn surgery     hysterectomy  . LAPAROTOMY  01/30/07   for bowel obstruction with LOA  . LUMBAR LAMINECTOMY/DECOMPRESSION MICRODISCECTOMY N/A 12/25/2016   Procedure: LUMBAR DECOMPRESSION L4-5;  Surgeon: Meade Maw, MD;  Location: [facility] ORS;  Service: Neurosurgery;  Laterality: N/A;  . NISSEN FUNDOPLICATION    . ROTATOR CUFF REPAIR     right  . SPLENECTOMY     secondary to colonoscopy prep  . TONSILLECTOMY      SOCIAL  HISTORY: Social History   Socioeconomic History  . Marital status: Widowed    Spouse name: Not on file  . Number of children: Not on file  . Years of education: Not on file  . Highest education level: Not on file  Occupational History  . Not on file  Tobacco Use  . Smoking status: Current Every Day Smoker    Packs/day: 1.00    Years: 50.00    Pack years: 50.00    Types: Cigarettes    Last attempt to quit: 12/13/2014    Years since quitting: 5.5  . Smokeless tobacco: Never Used  . Tobacco comment: half to one pack of cigs per day; stopped Chantix; plans to restart  Vaping Use  . Vaping Use: Never used  Substance and Sexual Activity  . Alcohol use: No    Alcohol/week: 0.0 standard drinks  . Drug use: No  . Sexual activity: Not on file  Other Topics Concern  . Not on file  Social History Narrative   Lives with spouse.   Social Determinants of Health   Financial Resource Strain:   . Difficulty of Paying Living Expenses:   Food Insecurity:   . Worried About Charity fundraiser in the Last Year:   . Arboriculturist in the Last Year:   Transportation Needs:   . Film/video editor (Medical):   Marland Kitchen Lack of Transportation (Non-Medical):   Physical Activity:   . Days of Exercise per Week:   .  Minutes of Exercise per Session:   Stress:   . Feeling of Stress :   Social Connections:   . Frequency of Communication with Friends and Family:   . Frequency of Social Gatherings with Friends and Family:   . Attends Religious Services:   . Active Member of Clubs or Organizations:   . Attends Archivist Meetings:   Marland Kitchen Marital Status:   Intimate Partner Violence:   . Fear of Current or Ex-Partner:   . Emotionally Abused:   Marland Kitchen Physically Abused:   . Sexually Abused:     FAMILY HISTORY: Family History  Problem Relation Age of Onset  . Diabetes Mother        type 2  . Heart disease Mother   . Hypertension Mother   . Coronary artery disease Father   . Heart attack  Sister     ALLERGIES:  is allergic to sulfa antibiotics.  MEDICATIONS:  Current Outpatient Medications  Medication Sig Dispense Refill  . clonazePAM (KLONOPIN) 0.5 MG tablet TAKE 1 TABLET BY MOUTH ONCE DAILY AS NEEDED ANXIETY 30 tablet 5  . HYDROcodone-acetaminophen (NORCO/VICODIN) 5-325 MG tablet Take 1 tablet by mouth 4 (four) times daily as needed.    . nortriptyline (PAMELOR) 25 MG capsule TAKE 1 CAPSULE BY MOUTH AT BEDTIME 90 capsule 1  . omeprazole (PRILOSEC) 40 MG capsule TAKE 1 CAPSULE BY MOUTH TWICE DAILY 180 capsule 1  . sertraline (ZOLOFT) 100 MG tablet TAKE 1 TABLET BY MOUTH ONCE DAILY 90 tablet 1  . traZODone (DESYREL) 50 MG tablet TAKE 2 TABLETS BY MOUTH AT BEDTIME 180 tablet 1  . cyanocobalamin (,VITAMIN B-12,) 1000 MCG/ML injection Inject into the muscle. (Patient not taking: Reported on 06/12/2020)    . varenicline (CHANTIX STARTING MONTH PAK) 0.5 MG X 11 & 1 MG X 42 tablet Take one 0.5 mg tablet by mouth once daily for 3 days, then increase to one 0.5 mg tablet twice daily for 4 days, then increase to one 1 mg tablet twice daily. (Patient not taking: Reported on 01/26/2020) 53 tablet 0  . Vitamin D, Ergocalciferol, (DRISDOL) 1.25 MG (50000 UT) CAPS capsule Take by mouth. (Patient not taking: Reported on 06/12/2020)     No current facility-administered medications for this visit.     PHYSICAL EXAMINATION: ECOG PERFORMANCE STATUS: 1 - Symptomatic but completely ambulatory Vitals:   06/12/20 0941  BP: 111/80  Pulse: 79  Resp: 18  Temp: (!) 96.1 F (35.6 C)   Filed Weights   06/12/20 0941  Weight: (!) 95 lb 6.4 oz (43.3 kg)    Physical Exam Constitutional:      General: She is not in acute distress. HENT:     Head: Normocephalic and atraumatic.  Eyes:     General: No scleral icterus.    Pupils: Pupils are equal, round, and reactive to light.  Cardiovascular:     Rate and Rhythm: Normal rate and regular rhythm.     Heart sounds: Normal heart sounds.  Pulmonary:      Effort: Pulmonary effort is normal. No respiratory distress.     Breath sounds: No wheezing.  Abdominal:     General: Bowel sounds are normal. There is no distension.     Palpations: Abdomen is soft. There is no mass.     Tenderness: There is no abdominal tenderness.  Musculoskeletal:        General: No deformity. Normal range of motion.     Cervical back: Normal range of motion and  neck supple.  Skin:    General: Skin is warm and dry.     Findings: No erythema or rash.  Neurological:     Mental Status: She is alert and oriented to person, place, and time. Mental status is at baseline.     Cranial Nerves: No cranial nerve deficit.     Coordination: Coordination normal.  Psychiatric:        Mood and Affect: Mood normal.     CMP Latest Ref Rng & Units 06/12/2020  Glucose 70 - 99 mg/dL 104(H)  BUN 8 - 23 mg/dL 10  Creatinine 0.44 - 1.00 mg/dL 0.73  Sodium 135 - 145 mmol/L 137  Potassium 3.5 - 5.1 mmol/L 4.1  Chloride 98 - 111 mmol/L 105  CO2 22 - 32 mmol/L 23  Calcium 8.9 - 10.3 mg/dL 8.9  Total Protein 6.5 - 8.1 g/dL 6.6  Total Bilirubin 0.3 - 1.2 mg/dL 0.9  Alkaline Phos 38 - 126 U/L 49  AST 15 - 41 U/L 21  ALT 0 - 44 U/L 19   CBC Latest Ref Rng & Units 06/12/2020  WBC 4.0 - 10.5 K/uL 10.2  Hemoglobin 12.0 - 15.0 g/dL 13.6  Hematocrit 36 - 46 % 39.5  Platelets 150 - 400 K/uL 263     MR KNEE LEFT WO CONTRAST  Result Date: 05/30/2020 CLINICAL DATA:  Left knee pain for 1 week EXAM: MRI OF THE LEFT KNEE WITHOUT CONTRAST TECHNIQUE: Multiplanar, multisequence MR imaging of the knee was performed. No intravenous contrast was administered. COMPARISON:  X-ray 06/19/2017, MRI 04/29/2012 FINDINGS: MENISCI Medial meniscus: Intrasubstance degeneration with irregular oblique tear extending to the inferior articular surface of the body segment (series 16, image 9). Additional irregular degenerative appearing tear involving the inferior aspect of the mid body (series 15, image 12;  series 16, image 6). Lateral meniscus: Mild intermediate intrasubstance signal within the posterior horn of the lateral meniscus suggesting intrasubstance degeneration. No evidence of lateral meniscal tear. LIGAMENTS Cruciates:  Intact ACL and PCL. Collaterals: Medial collateral ligament is intact. Lateral collateral ligament complex is intact. CARTILAGE Patellofemoral: Mild surface irregularity of the lateral patellar facet without focal chondral defect. Trochlear cartilage intact. Medial: Mild chondral thinning and surface irregularity of the lateral portion of the medial femoral condyle near the intercondylar notch. Lateral:  No chondral defect. Joint: Small to moderate knee joint effusion. Mild edema within Hoffa's fat. Popliteal Fossa:  Small Baker's cyst.  Intact popliteus tendon. Extensor Mechanism: Intact quadriceps tendon and patellar tendon. Mild distal quadriceps tendinosis. Bones: Small subcortical cyst or intraosseous ganglion near the PCL attachment of the posterior tibia. No acute fracture. No malalignment. No suspicious bone lesion. Other: Intramuscular edema within the lateral head of the gastrocnemius muscle and popliteus muscle. IMPRESSION: 1. 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 2. Small to moderate knee joint effusion and small Baker's cyst. 3. Mild medial and patellofemoral compartment chondral irregularities, not significantly progressed from prior MRI. 4. Intramuscular edema within the lateral head of the gastrocnemius muscle and popliteus muscle, which may reflect muscle strain. Electronically Signed   By: Davina Poke D.O.   On: 05/30/2020 09:54    LABORATORY DATA:  I have reviewed the data as listed Lab Results  Component Value Date   WBC 10.2 06/12/2020   HGB 13.6 06/12/2020   HCT 39.5 06/12/2020   MCV 100.0 06/12/2020   PLT 263 06/12/2020   Recent Labs    01/26/20 0852 06/12/20 0930  NA 136 137  K  4.2 4.1  CL 101 105  CO2 25 23  GLUCOSE 94 104*   BUN 11 10  CREATININE 0.73 0.73  CALCIUM 9.2 8.9  GFRNONAA  --  >60  GFRAA  --  >60  PROT 6.2 6.6  ALBUMIN 4.2 4.5  AST 18 21  ALT 13 19  ALKPHOS 36* 49  BILITOT 0.4 0.9   Iron/TIBC/Ferritin/ %Sat    Component Value Date/Time   IRON 151 (H) 06/11/2017 1022   IRONPCTSAT 33.4 06/11/2017 1022        ASSESSMENT & PLAN:  1. History of Crohn's disease   2. Night sweat   3. Weight loss   4. Tobacco abuse    #Labs are reviewed and discussed with patient. History of leukocytosis and previous work-up was not remarkable. Leukocytosis has improved.  Leukocytosis is likely reactive, due to smoking.  Unintentional weight loss, etiology unknown.  Malabsorption from inflammatory bowel disease versus other etiologies. History of Crohn's disease, she is not actively following up with gastroenterology.  Recommend patient to reestablish care with [facility] gastroenterology for further evaluation of the status of inflammatory bowel disease.  Night sweats, unknown etiology.  Work-up in progress. Tobacco abuse, smoke cessation was discussed with patient.  I referred patient to establish care with lung cancer screening program.  If her GI work-up is negative I will plan to obtain abdomen pelvis CT scan. Follow-up in 3 months.  Orders Placed This Encounter  Procedures  . Ambulatory referral to Gastroenterology    Referral Priority:   Routine    Referral Type:   Consultation    Referral Reason:   Specialty Services Required    Referred to Provider:   Efrain Sella, MD    Number of Visits Requested:   1    All questions were answered. The patient knows to call the clinic with any problems questions or concerns.  Return of visit: 3 months  Earlie Server, MD, PhD Hematology Oncology [facility] at [facility] Pager- [phone] 06/12/2020

## 2020-06-12 NOTE — Telephone Encounter (Signed)
Received referral for initial lung cancer screening scan. Contacted patient and obtained smoking history,(current, 50 pack year) as well as answering questions related to screening process. Patient denies signs of lung cancer such as weight loss or hemoptysis. Patient denies comorbidity that would prevent curative treatment if lung cancer were found. Patient is scheduled for shared decision making visit and CT scan on 06/21/20 at 1030am.

## 2020-06-12 NOTE — Progress Notes (Signed)
Patient here for follow up. Reports she had a meniscus tear.

## 2020-06-13 DIAGNOSIS — M2392 Unspecified internal derangement of left knee: Secondary | ICD-10-CM | POA: Diagnosis not present

## 2020-06-13 DIAGNOSIS — S83242A Other tear of medial meniscus, current injury, left knee, initial encounter: Secondary | ICD-10-CM | POA: Diagnosis not present

## 2020-06-14 ENCOUNTER — Other Ambulatory Visit: Payer: Self-pay | Admitting: Orthopedic Surgery

## 2020-06-14 ENCOUNTER — Encounter: Payer: Self-pay | Admitting: Orthopedic Surgery

## 2020-06-14 ENCOUNTER — Other Ambulatory Visit: Payer: Self-pay

## 2020-06-20 ENCOUNTER — Other Ambulatory Visit
Admission: RE | Admit: 2020-06-20 | Discharge: 2020-06-20 | Disposition: A | Discharge status: Home / Self Care | Payer: Medicare Other | Source: Ambulatory Visit | Attending: Orthopedic Surgery | Admitting: Orthopedic Surgery

## 2020-06-20 ENCOUNTER — Other Ambulatory Visit: Payer: Self-pay

## 2020-06-20 DIAGNOSIS — Z20822 Contact with and (suspected) exposure to covid-19: Secondary | ICD-10-CM | POA: Diagnosis not present

## 2020-06-20 DIAGNOSIS — Z01812 Encounter for preprocedural laboratory examination: Secondary | ICD-10-CM | POA: Insufficient documentation

## 2020-06-20 LAB — SARS CORONAVIRUS 2 (TAT 6-24 HRS): SARS Coronavirus 2: NEGATIVE

## 2020-06-21 ENCOUNTER — Telehealth: Payer: Medicare Other | Admitting: Oncology

## 2020-06-21 ENCOUNTER — Ambulatory Visit: Payer: Medicare Other

## 2020-06-22 ENCOUNTER — Ambulatory Visit: Payer: Medicare Other | Admitting: Anesthesiology

## 2020-06-22 ENCOUNTER — Ambulatory Visit
Admission: RE | Admit: 2020-06-22 | Discharge: 2020-06-22 | Disposition: A | Discharge status: Home / Self Care | Payer: Medicare Other | Attending: Orthopedic Surgery | Admitting: Orthopedic Surgery

## 2020-06-22 ENCOUNTER — Encounter: Payer: Self-pay | Admitting: Orthopedic Surgery

## 2020-06-22 ENCOUNTER — Other Ambulatory Visit: Payer: Self-pay

## 2020-06-22 ENCOUNTER — Encounter
Admission: RE | Disposition: A | Discharge status: Home / Self Care | Payer: Self-pay | Source: Home / Self Care | Attending: Orthopedic Surgery

## 2020-06-22 DIAGNOSIS — J449 Chronic obstructive pulmonary disease, unspecified: Secondary | ICD-10-CM | POA: Insufficient documentation

## 2020-06-22 DIAGNOSIS — S83242A Other tear of medial meniscus, current injury, left knee, initial encounter: Secondary | ICD-10-CM | POA: Diagnosis not present

## 2020-06-22 DIAGNOSIS — M48062 Spinal stenosis, lumbar region with neurogenic claudication: Secondary | ICD-10-CM | POA: Insufficient documentation

## 2020-06-22 DIAGNOSIS — M11262 Other chondrocalcinosis, left knee: Secondary | ICD-10-CM | POA: Diagnosis not present

## 2020-06-22 DIAGNOSIS — M1990 Unspecified osteoarthritis, unspecified site: Secondary | ICD-10-CM | POA: Diagnosis not present

## 2020-06-22 DIAGNOSIS — Z86718 Personal history of other venous thrombosis and embolism: Secondary | ICD-10-CM | POA: Diagnosis not present

## 2020-06-22 DIAGNOSIS — M810 Age-related osteoporosis without current pathological fracture: Secondary | ICD-10-CM | POA: Insufficient documentation

## 2020-06-22 DIAGNOSIS — F17200 Nicotine dependence, unspecified, uncomplicated: Secondary | ICD-10-CM | POA: Insufficient documentation

## 2020-06-22 DIAGNOSIS — G4733 Obstructive sleep apnea (adult) (pediatric): Secondary | ICD-10-CM | POA: Insufficient documentation

## 2020-06-22 DIAGNOSIS — F329 Major depressive disorder, single episode, unspecified: Secondary | ICD-10-CM | POA: Diagnosis not present

## 2020-06-22 DIAGNOSIS — G43909 Migraine, unspecified, not intractable, without status migrainosus: Secondary | ICD-10-CM | POA: Diagnosis not present

## 2020-06-22 DIAGNOSIS — Z79899 Other long term (current) drug therapy: Secondary | ICD-10-CM | POA: Diagnosis not present

## 2020-06-22 DIAGNOSIS — M1712 Unilateral primary osteoarthritis, left knee: Secondary | ICD-10-CM | POA: Diagnosis not present

## 2020-06-22 DIAGNOSIS — K50912 Crohn's disease, unspecified, with intestinal obstruction: Secondary | ICD-10-CM | POA: Insufficient documentation

## 2020-06-22 DIAGNOSIS — M2242 Chondromalacia patellae, left knee: Secondary | ICD-10-CM | POA: Diagnosis not present

## 2020-06-22 DIAGNOSIS — K219 Gastro-esophageal reflux disease without esophagitis: Secondary | ICD-10-CM | POA: Insufficient documentation

## 2020-06-22 DIAGNOSIS — X58XXXA Exposure to other specified factors, initial encounter: Secondary | ICD-10-CM | POA: Diagnosis not present

## 2020-06-22 DIAGNOSIS — E669 Obesity, unspecified: Secondary | ICD-10-CM | POA: Diagnosis not present

## 2020-06-22 DIAGNOSIS — M94262 Chondromalacia, left knee: Secondary | ICD-10-CM | POA: Diagnosis not present

## 2020-06-22 HISTORY — DX: Gastro-esophageal reflux disease without esophagitis: K21.9

## 2020-06-22 HISTORY — PX: KNEE ARTHROSCOPY WITH MEDIAL MENISECTOMY: SHX5651

## 2020-06-22 HISTORY — DX: Elevated white blood cell count, unspecified: D72.829

## 2020-06-22 SURGERY — ARTHROSCOPY, KNEE, WITH MEDIAL MENISCECTOMY
Anesthesia: General | Site: Knee | Laterality: Left

## 2020-06-22 MED ORDER — CEFAZOLIN SODIUM-DEXTROSE 2-4 GM/100ML-% IV SOLN
2.0000 g | INTRAVENOUS | Status: AC
  Administered 2020-06-22: 2 g via INTRAVENOUS

## 2020-06-22 MED ORDER — LIDOCAINE-EPINEPHRINE 1 %-1:100000 IJ SOLN
INTRAMUSCULAR | Status: DC | PRN
  Administered 2020-06-22: 7 mL

## 2020-06-22 MED ORDER — FENTANYL CITRATE (PF) 100 MCG/2ML IJ SOLN: 25.0000 ug | INTRAMUSCULAR | Status: DC | PRN

## 2020-06-22 MED ORDER — OXYCODONE HCL 5 MG/5ML PO SOLN: 5.0000 mg | Freq: Once | ORAL | Status: AC | PRN

## 2020-06-22 MED ORDER — BUPIVACAINE HCL (PF) 0.5 % IJ SOLN
INTRAMUSCULAR | Status: DC | PRN
  Administered 2020-06-22: 7 mL via INTRA_ARTICULAR

## 2020-06-22 MED ORDER — ONDANSETRON 4 MG PO TBDP
4.0000 mg | ORAL_TABLET | Freq: Three times a day (TID) | ORAL | 0 refills | Status: DC | PRN
Start: 2020-06-22 — End: 2021-06-08

## 2020-06-22 MED ORDER — ONDANSETRON HCL 4 MG/2ML IJ SOLN
4.0000 mg | Freq: Once | INTRAMUSCULAR | Status: AC | PRN
  Administered 2020-06-22: 4 mg via INTRAVENOUS

## 2020-06-22 MED ORDER — PROPOFOL 10 MG/ML IV BOLUS
INTRAVENOUS | Status: DC | PRN
  Administered 2020-06-22: 120 mg via INTRAVENOUS

## 2020-06-22 MED ORDER — DEXAMETHASONE SODIUM PHOSPHATE 4 MG/ML IJ SOLN
INTRAMUSCULAR | Status: DC | PRN
  Administered 2020-06-22: 4 mg via INTRAVENOUS

## 2020-06-22 MED ORDER — FENTANYL CITRATE (PF) 100 MCG/2ML IJ SOLN
INTRAMUSCULAR | Status: DC | PRN
  Administered 2020-06-22 (×2): 25 ug via INTRAVENOUS

## 2020-06-22 MED ORDER — ONDANSETRON HCL 4 MG/2ML IJ SOLN
INTRAMUSCULAR | Status: DC | PRN
  Administered 2020-06-22: 4 mg via INTRAVENOUS

## 2020-06-22 MED ORDER — OXYCODONE HCL 5 MG PO TABS
5.0000 mg | ORAL_TABLET | Freq: Once | ORAL | Status: AC | PRN
  Administered 2020-06-22: 5 mg via ORAL

## 2020-06-22 MED ORDER — MIDAZOLAM HCL 5 MG/5ML IJ SOLN
INTRAMUSCULAR | Status: DC | PRN
  Administered 2020-06-22: 1 mg via INTRAVENOUS

## 2020-06-22 MED ORDER — ACETAMINOPHEN 500 MG PO TABS
1000.0000 mg | ORAL_TABLET | Freq: Three times a day (TID) | ORAL | 2 refills | Status: DC
Start: 2020-06-22 — End: 2021-01-29

## 2020-06-22 MED ORDER — LACTATED RINGERS IV SOLN: INTRAVENOUS | Status: DC

## 2020-06-22 MED ORDER — HYDROCODONE-ACETAMINOPHEN 5-325 MG PO TABS: 1.0000 | ORAL_TABLET | ORAL | 0 refills | Status: DC | PRN

## 2020-06-22 MED ORDER — KETOROLAC TROMETHAMINE 30 MG/ML IJ SOLN: 15.0000 mg | Freq: Once | INTRAMUSCULAR | Status: DC | PRN

## 2020-06-22 MED ORDER — LIDOCAINE HCL (CARDIAC) PF 100 MG/5ML IV SOSY
PREFILLED_SYRINGE | INTRAVENOUS | Status: DC | PRN
  Administered 2020-06-22: 40 mg via INTRATRACHEAL

## 2020-06-22 SURGICAL SUPPLY — 37 items
ADAPTER IRRIG TUBE 2 SPIKE SOL (ADAPTER) ×4 IMPLANT
ADPR TBG 2 SPK PMP STRL ASCP (ADAPTER) ×2
APL PRP STRL LF DISP 70% ISPRP (MISCELLANEOUS) ×1
BLADE SURG SZ11 CARB STEEL (BLADE) ×2 IMPLANT
BNDG COHESIVE 4X5 TAN STRL (GAUZE/BANDAGES/DRESSINGS) ×2 IMPLANT
BNDG ESMARK 6X12 TAN STRL LF (GAUZE/BANDAGES/DRESSINGS) ×2 IMPLANT
BUR RADIUS 3.5 (BURR) ×1 IMPLANT
CHLORAPREP W/TINT 26 (MISCELLANEOUS) ×2 IMPLANT
COOLER POLAR GLACIER W/PUMP (MISCELLANEOUS) ×2 IMPLANT
COVER LIGHT HANDLE UNIVERSAL (MISCELLANEOUS) ×4 IMPLANT
CUFF TOURN SGL QUICK 30 (TOURNIQUET CUFF) ×2
CUFF TRNQT CYL 30X4X21-28X (TOURNIQUET CUFF) IMPLANT
DRAPE IMP U-DRAPE 54X76 (DRAPES) ×2 IMPLANT
GAUZE SPONGE 4X4 12PLY STRL (GAUZE/BANDAGES/DRESSINGS) ×2 IMPLANT
GLOVE BIO SURGEON STRL SZ7.5 (GLOVE) ×2 IMPLANT
GLOVE BIOGEL PI IND STRL 8 (GLOVE) ×1 IMPLANT
GLOVE BIOGEL PI INDICATOR 8 (GLOVE) ×1
GOWN STRL REUS W/ TWL LRG LVL3 (GOWN DISPOSABLE) ×1 IMPLANT
GOWN STRL REUS W/TWL LRG LVL3 (GOWN DISPOSABLE) ×2
GOWN STRL REUS W/TWL XL LVL3 (GOWN DISPOSABLE) ×2 IMPLANT
IV LACTATED RINGER IRRG 3000ML (IV SOLUTION) ×6
IV LR IRRIG 3000ML ARTHROMATIC (IV SOLUTION) ×4 IMPLANT
KIT TURNOVER KIT A (KITS) ×2 IMPLANT
MANIFOLD NEPTUNE II (INSTRUMENTS) ×2 IMPLANT
MAT ABSORB  FLUID 56X50 GRAY (MISCELLANEOUS) ×2
MAT ABSORB FLUID 56X50 GRAY (MISCELLANEOUS) ×1 IMPLANT
PACK ARTHROSCOPY KNEE (MISCELLANEOUS) ×2 IMPLANT
PAD WRAPON POLAR KNEE (MISCELLANEOUS) ×1 IMPLANT
PADDING CAST BLEND 6X4 STRL (MISCELLANEOUS) ×1 IMPLANT
PADDING STRL CAST 6IN (MISCELLANEOUS) ×1
SET TUBE SUCT SHAVER OUTFL 24K (TUBING) ×2 IMPLANT
SET TUBE TIP INTRA-ARTICULAR (MISCELLANEOUS) ×2 IMPLANT
SUT ETHILON 3-0 FS-10 30 BLK (SUTURE) ×2
SUTURE EHLN 3-0 FS-10 30 BLK (SUTURE) ×1 IMPLANT
TOWEL OR 17X26 4PK STRL BLUE (TOWEL DISPOSABLE) ×4 IMPLANT
TUBING ARTHRO INFLOW-ONLY STRL (TUBING) ×2 IMPLANT
WRAPON POLAR PAD KNEE (MISCELLANEOUS) ×2

## 2020-06-22 NOTE — Discharge Instructions (Signed)
Arthroscopic Knee Surgery - Partial Meniscectomy   Post-Op Instructions   1. Bracing or crutches: Crutches will be provided at the time of discharge from the surgery center if you do not already have them.   2. Ice: You may be provided with a device [facility]) that allows you to ice the affected area effectively. Otherwise you can ice manually.    3. Driving:  Plan on not driving for at least two weeks. Please note that you are advised NOT to drive while taking narcotic pain medications as you may be impaired and unsafe to drive.   4. Activity: Ankle pumps several times an hour while awake to prevent blood clots. Weight bearing: as tolerated. Use crutches for as needed (usually ~1 week or less) until pain allows you to ambulate without a limp. Bending and straightening the knee is unlimited. Elevate knee above heart level as much as possible for one week. Avoid standing more than 5 minutes (consecutively) for the first week.  Avoid long distance travel for 2 weeks.  5. Medications:  - You have been provided a prescription for narcotic pain medicine. After surgery, take 1-2 narcotic tablets every 4 hours if needed for severe pain.  - You may take up to 3050m/day of tylenol (acetaminophen). You can take 10054m3x/day. Please check your narcotic. If you have acetaminophen in your narcotic (each tablet will be 32590m be careful not to exceed a total of 3000m53my of acetaminophen.  - A prescription for anti-nausea medication will be provided in case the narcotic medicine or anesthesia causes nausea - take 1 tablet every 6 hours only if nauseated.    6. Bandages: The physical therapist should change the bandages at the first post-op appointment. If needed, the dressing supplies have been provided to you.   7. Physical Therapy: 1-2 times per week for 6 weeks. Therapy typically starts on post operative Day 3 or 4. You have been provided an order for physical therapy. The therapist will provide home  exercises.   8. Work: May return to full work usually around 2 weeks after 1st post-operative visit. May do light duty/desk job in approximately 1-2 weeks when off of narcotics, pain is well-controlled, and swelling has decreased. Labor intensive jobs may require 4-6 weeks to return.      9. Post-Op Appointments: Your first post-op appointment will be with Dr. PatePosey Prontoapproximately 2 weeks time.    If you find that they have not been scheduled please call the Orthopaedic Appointment front desk at [phone] General Anesthesia, Adult, Care After This sheet gives you information about how to care for yourself after your procedure. Your health care provider may also give you more specific instructions. If you have problems or questions, contact your health care provider. What can I expect after the procedure? After the procedure, the following side effects are common:  Pain or discomfort at the IV site.  Nausea.  Vomiting.  Sore throat.  Trouble concentrating.  Feeling cold or chills.  Weak or tired.  Sleepiness and fatigue.  Soreness and body aches. These side effects can affect parts of the body that were not involved in surgery. Follow these instructions at home:  For at least 24 hours after the procedure:  Have a responsible adult stay with you. It is important to have someone help care for you until you are awake and alert.  Rest as needed.  Do not: ? Participate in activities in which you could fall or become  injured. ? Drive. ? Use heavy machinery. ? Drink alcohol. ? Take sleeping pills or medicines that cause drowsiness. ? Make important decisions or sign legal documents. ? Take care of children on your own. Eating and drinking  Follow any instructions from your health care provider about eating or drinking restrictions.  When you feel hungry, start by eating small amounts of foods that are soft and easy to digest (bland), such as toast. Gradually  return to your regular diet.  Drink enough fluid to keep your urine pale yellow.  If you vomit, rehydrate by drinking water, juice, or clear broth. General instructions  If you have sleep apnea, surgery and certain medicines can increase your risk for breathing problems. Follow instructions from your health care provider about wearing your sleep device: ? Anytime you are sleeping, including during daytime naps. ? While taking prescription pain medicines, sleeping medicines, or medicines that make you drowsy.  Return to your normal activities as told by your health care provider. Ask your health care provider what activities are safe for you.  Take over-the-counter and prescription medicines only as told by your health care provider.  If you smoke, do not smoke without supervision.  Keep all follow-up visits as told by your health care provider. This is important. Contact a health care provider if:  You have nausea or vomiting that does not get better with medicine.  You cannot eat or drink without vomiting.  You have pain that does not get better with medicine.  You are unable to pass urine.  You develop a skin rash.  You have a fever.  You have redness around your IV site that gets worse. Get help right away if:  You have difficulty breathing.  You have chest pain.  You have blood in your urine or stool, or you vomit blood. Summary  After the procedure, it is common to have a sore throat or nausea. It is also common to feel tired.  Have a responsible adult stay with you for the first 24 hours after general anesthesia. It is important to have someone help care for you until you are awake and alert.  When you feel hungry, start by eating small amounts of foods that are soft and easy to digest (bland), such as toast. Gradually return to your regular diet.  Drink enough fluid to keep your urine pale yellow.  Return to your normal activities as told by your health care  provider. Ask your health care provider what activities are safe for you. This information is not intended to replace advice given to you by your health care provider. Make sure you discuss any questions you have with your health care provider. Document Revised: 10/31/2017 Document Reviewed: 06/13/2017 Elsevier Patient Education  [place].

## 2020-06-22 NOTE — Anesthesia Procedure Notes (Signed)
Procedure Name: LMA Insertion Date/Time: 06/22/2020 1:26 PM Performed by: Mayme Genta, CRNA Pre-anesthesia Checklist: Patient identified, Emergency Drugs available, Suction available, Timeout performed and Patient being monitored Patient Re-evaluated:Patient Re-evaluated prior to induction Oxygen Delivery Method: Circle system utilized Preoxygenation: Pre-oxygenation with 100% oxygen Induction Type: IV induction LMA: LMA inserted LMA Size: 3.0 Number of attempts: 1 Placement Confirmation: positive ETCO2 and breath sounds checked- equal and bilateral Tube secured with: Tape

## 2020-06-22 NOTE — Anesthesia Postprocedure Evaluation (Signed)
Anesthesia Post Note  Patient: Tamara [name]  Procedure(s) Performed: Left knee arthroscopic partial medial meniscectomy (Left Knee)     Patient location during evaluation: PACU Anesthesia Type: General Level of consciousness: awake and alert and oriented Pain management: pain level controlled Vital Signs Assessment: post-procedure vital signs reviewed and stable Respiratory status: spontaneous breathing, nonlabored ventilation and respiratory function stable Cardiovascular status: blood pressure returned to baseline and stable Postop Assessment: no headache and no backache Anesthetic complications: no   No complications documented.  Sinda Du

## 2020-06-22 NOTE — H&P (Signed)
Paper H&P to be scanned into permanent record. H&P reviewed. No significant changes noted.  

## 2020-06-22 NOTE — Anesthesia Preprocedure Evaluation (Signed)
Anesthesia Evaluation  Patient identified by MRN, date of birth, ID band Patient awake    Reviewed: Allergy & Precautions, NPO status , Patient's Chart, lab work & pertinent test results, reviewed documented beta blocker date and time   Airway Mallampati: II  TM Distance: >3 FB     Dental  (+) Chipped   Pulmonary sleep apnea , COPD, Current Smoker and Patient abstained from smoking.,    Pulmonary exam normal breath sounds clear to auscultation       Cardiovascular Exercise Tolerance: Good negative cardio ROS Normal cardiovascular exam Rhythm:Regular Rate:Normal     Neuro/Psych  Headaches, PSYCHIATRIC DISORDERS Depression  Neuromuscular disease    GI/Hepatic Neg liver ROS, GERD  ,  Endo/Other  negative endocrine ROS  Renal/GU negative Renal ROS     Musculoskeletal  (+) Arthritis ,   Abdominal Normal abdominal exam  (+) - obese,   Peds  Hematology negative hematology ROS (+)   Anesthesia Other Findings Smokes. Jejunal bypass - Nissen had to be redone. OK now. Neck movement ojk. EKG shows PACs, otherwise ok.  Fell yesterday (06/21/20) - sacattered abrasions and small bruise on operative knee. Evaluated by surgeon and plans to proceed.  Reproductive/Obstetrics negative OB ROS                             Anesthesia Physical  Anesthesia Plan  ASA: III  Anesthesia Plan: General   Post-op Pain Management:    Induction: Intravenous  PONV Risk Score and Plan: 2  Airway Management Planned: LMA  Additional Equipment:   Intra-op Plan:   Post-operative Plan:   Informed Consent: I have reviewed the patients History and Physical, chart, labs and discussed the procedure including the risks, benefits and alternatives for the proposed anesthesia with the patient or authorized representative who has indicated his/her understanding and acceptance.     Dental advisory given  Plan Discussed  with: CRNA  Anesthesia Plan Comments:         Anesthesia Quick Evaluation  Patient Active Problem List   Diagnosis Date Noted   Chronic bronchitis ([place]) 01/28/2020   OSA (obstructive sleep apnea) 08/01/2019   Intractable chronic migraine without aura and without status migrainosus 05/20/2019   GERD (gastroesophageal reflux disease) 04/28/2019   Pharyngeal irritation 04/19/2019   Facial twitching 07/11/2018   Insomnia due to anxiety and fear 07/11/2018   History of Nissen fundoplication 25/36/6440   DVT (deep venous thrombosis) ([place]) 04/24/2018   Malnutrition ([place]) 02/20/2018   Reflux esophagitis 01/24/2018   Macrocytosis 12/04/2017   Primary osteoarthritis of left knee 06/24/2017   Spinal stenosis, lumbar region, with neurogenic claudication 11/08/2016   Polycythemia, secondary 10/24/2016   Medicare annual wellness visit, subsequent 04/16/2016   Underweight 08/27/2015   Vitamin D deficiency 08/27/2015   Pain in both upper extremities 08/24/2015   Amaurosis fugax 12/13/2014   Uric acid arthropathy 10/08/2014   Carpal tunnel syndrome 03/08/2014   B12 deficiency 01/26/2014   Tobacco abuse 01/26/2014   Encounter for preventive health examination 05/11/2013   Steroid-induced osteoporosis 11/19/2011   Chronic abdominal pain    H/O small bowel obstruction    Crohn's disease of intestine, with intestinal obstruction (HCC)    History of DVT (deep vein thrombosis) 01/10/2007    CBC Latest Ref Rng & Units 06/12/2020 01/26/2020 06/01/2019  WBC 4.0 - 10.5 K/uL 10.2 8.6 9.7  Hemoglobin 12.0 - 15.0 g/dL 13.6 13.6 12.7  Hematocrit 36 - 46 %  39.5 40.3 39.1  Platelets 150 - 400 K/uL 263 263.0 294   BMP Latest Ref Rng & Units 06/12/2020 01/26/2020 07/08/2018  Glucose 70 - 99 mg/dL 104(H) 94 85  BUN 8 - 23 mg/dL 10 11 14   Creatinine 0.44 - 1.00 mg/dL 0.73 0.73 0.75  Sodium 135 - 145 mmol/L 137 136 139  Potassium 3.5 - 5.1 mmol/L 4.1 4.2 4.3  Chloride 98 -  111 mmol/L 105 101 107  CO2 22 - 32 mmol/L 23 25 23   Calcium 8.9 - 10.3 mg/dL 8.9 9.2 8.9    Risks and benefits of anesthesia discussed at length, patient or surrogate demonstrates understanding. Appropriately NPO. Plan to proceed with anesthesia.  Champ Mungo, MD 06/22/20

## 2020-06-22 NOTE — Transfer of Care (Addendum)
Immediate Anesthesia Transfer of Care Note  Patient: Tamara [name]  Procedure(s) Performed: Left knee arthroscopic partial medial meniscectomy (Left Knee)  Patient Location: PACU  Anesthesia Type: General  Level of Consciousness: awake, alert  and patient cooperative  Airway and Oxygen Therapy: Patient Spontanous Breathing and Patient connected to supplemental oxygen  Post-op Assessment: Post-op Vital signs reviewed, Patient's Cardiovascular Status Stable, Respiratory Function Stable, Patent Airway and No signs of Nausea or vomiting  Post-op Vital Signs: Reviewed and stable  Complications: No complications documented.

## 2020-06-22 NOTE — Op Note (Signed)
Operative Note    SURGERY DATE: 06/22/2020   PRE-OP DIAGNOSIS:  1. Left medial meniscus tear 2. Left  medial and patellofemoral compartment degenerative changes   POST-OP DIAGNOSIS:  1. Left medial meniscus tear 2. Left  medial and patellofemoral compartment degenerative changes   PROCEDURES:  1.  Left knee arthroscopy, partial medial meniscectomy 2.  Chondroplasty of patellofemoral and medial compartments   SURGEON: Cato Mulligan, MD   ANESTHESIA: Gen   ESTIMATED BLOOD LOSS: minimal   TOTAL IV FLUIDS: per anesthesia   INDICATION(S):  Tamara [name] is a 74 y.o. female with signs and symptoms as well as MRI finding of medial meniscus tear.  She had failed conservative management in the form of activity modifications, medications, and corticosteroid injection.  After discussion of risks, benefits, and alternatives to surgery, the patient elected to proceed.   OPERATIVE FINDINGS:    Examination under anesthesia: A careful examination under anesthesia was performed.  Passive range of motion was: Hyperextension: 1.  Extension: 0.  Flexion: 140.  Lachman: normal. Pivot Shift: normal.  Posterior drawer: normal.  Varus stability in full extension: normal.  Varus stability in 30 degrees of flexion: normal.  Valgus stability in full extension: normal.  Valgus stability in 30 degrees of flexion: normal.   Intra-operative findings: A thorough arthroscopic examination of the knee was performed.  The findings are: 1. Suprapatellar pouch: Normal 2. Undersurface of median ridge: Grade 4 chondral defect 3. Medial patellar facet: Grade 1 softening 4. Lateral patellar facet: Grade 1 softening 5. Trochlea: Grade 1 degenerative changes 6. Lateral gutter/popliteus tendon: Normal 7. Hoffa's fat pad: Inflamed 8. Medial gutter/plica: Normal 9. ACL: Normal, significant chondrocalcinosis about the ACL and intercondylar notch 10. PCL: Normal 11. Medial meniscus: Complex tear with primarily horizontal  tear pattern of the posterior horn with parrot-beak component at the posterior horn/body junction with flipped fragment into the tibial recess.  Additionally there was a vertical tear of the posterior horn affecting approximately 50% of the meniscus width 12. Medial compartment cartilage: Focal grade 3 degenerative changes to the medial femoral condyle; grade 2 degenerative changes to the tibial plateau; significant chondrocalcinosis 13. Lateral meniscus: Normal 14. Lateral compartment cartilage: Grade 1-2 degenerative changes to the tibial plateau; normal lateral femoral condyle; significant chondrocalcinosis   OPERATIVE REPORT:     I identified Tamara [name] in the pre-operative holding area. I marked the operative knee with my initials. I reviewed the risks and benefits of the proposed surgical intervention and the patient wished to proceed. The patient was transferred to the operative suite and placed in the supine position with all bony prominences padded.  Anesthesia was administered. Appropriate IV antibiotics were administered prior to incision. The extremity was then prepped and draped in standard fashion. A time out was performed confirming the correct extremity, correct patient, and correct procedure.   Arthroscopy portals were marked. Local anesthetic was injected to the planned portal sites. The anterolateral portal was established with an 11 blade.      The arthroscope was placed in the anterolateral portal and then into the suprapatellar pouch. Next, the medial portal was established under needle localization. A diagnostic knee scope was completed with the above findings. The medial meniscus tear was identified.   The MCL was pie-crusted to improve visualization of the posterior horn. The meniscal tear was debrided using an arthroscopic biter and an oscillating shaver until the meniscus had stable borders.  This involved resection of the inferior flipped fragment, excision of  the  inferior leaflet of the posterior horn, and further resection of approximately 50% of the superior leaflet at the posterior horn to stabilize the above-described tear.  A chondroplasty was performed of the medial compartmen and patellofemoral compartment such that there were stable cartilage edges without any loose fragments of cartilage.   Loose pieces of chondrocalcinosis were removed with an oscillating shaver as well.  Arthroscopic fluid was removed from the joint.   The portals were closed with 3-0 Nylon suture. Sterile dressings included Xeroform, 4x4s, Sof-Rol, and Bias wrap. A Polarcare was placed.  The patient was then awakened and taken to the PACU hemodynamically stable without complication.   POSTOPERATIVE PLAN: The patient will be discharged home today once they meet PACU criteria. Aspirin for DVT prophylaxis and ibuprofen were not prescribed postoperatively due to history of Crohn's disease.  Physical therapy will start on POD#3-4. Weight-bearing as tolerated. Follow up in 2 weeks per protocol.

## 2020-06-26 ENCOUNTER — Encounter: Payer: Self-pay | Admitting: Orthopedic Surgery

## 2020-06-26 DIAGNOSIS — M25562 Pain in left knee: Secondary | ICD-10-CM | POA: Diagnosis not present

## 2020-06-26 DIAGNOSIS — G8929 Other chronic pain: Secondary | ICD-10-CM | POA: Diagnosis not present

## 2020-06-26 DIAGNOSIS — Z9889 Other specified postprocedural states: Secondary | ICD-10-CM | POA: Diagnosis not present

## 2020-06-26 DIAGNOSIS — M25662 Stiffness of left knee, not elsewhere classified: Secondary | ICD-10-CM | POA: Diagnosis not present

## 2020-06-26 DIAGNOSIS — M6281 Muscle weakness (generalized): Secondary | ICD-10-CM | POA: Diagnosis not present

## 2020-07-05 ENCOUNTER — Ambulatory Visit: Payer: Medicare Other

## 2020-07-05 ENCOUNTER — Inpatient Hospital Stay: Payer: Medicare Other | Admitting: Oncology

## 2020-07-05 DIAGNOSIS — M25662 Stiffness of left knee, not elsewhere classified: Secondary | ICD-10-CM | POA: Diagnosis not present

## 2020-07-05 DIAGNOSIS — M6281 Muscle weakness (generalized): Secondary | ICD-10-CM | POA: Diagnosis not present

## 2020-07-05 DIAGNOSIS — M25562 Pain in left knee: Secondary | ICD-10-CM | POA: Diagnosis not present

## 2020-07-05 DIAGNOSIS — Z9889 Other specified postprocedural states: Secondary | ICD-10-CM | POA: Diagnosis not present

## 2020-07-05 DIAGNOSIS — G8929 Other chronic pain: Secondary | ICD-10-CM | POA: Diagnosis not present

## 2020-07-07 DIAGNOSIS — G8929 Other chronic pain: Secondary | ICD-10-CM | POA: Diagnosis not present

## 2020-07-07 DIAGNOSIS — M25562 Pain in left knee: Secondary | ICD-10-CM | POA: Diagnosis not present

## 2020-07-07 DIAGNOSIS — M25662 Stiffness of left knee, not elsewhere classified: Secondary | ICD-10-CM | POA: Diagnosis not present

## 2020-07-07 DIAGNOSIS — M6281 Muscle weakness (generalized): Secondary | ICD-10-CM | POA: Diagnosis not present

## 2020-07-07 DIAGNOSIS — Z9889 Other specified postprocedural states: Secondary | ICD-10-CM | POA: Diagnosis not present

## 2020-07-10 DIAGNOSIS — Z9889 Other specified postprocedural states: Secondary | ICD-10-CM | POA: Diagnosis not present

## 2020-07-14 ENCOUNTER — Ambulatory Visit
Admission: RE | Admit: 2020-07-14 | Discharge: 2020-07-14 | Disposition: A | Discharge status: Home / Self Care | Payer: Medicare Other | Source: Ambulatory Visit | Attending: Oncology | Admitting: Oncology

## 2020-07-14 ENCOUNTER — Other Ambulatory Visit: Payer: Self-pay

## 2020-07-14 ENCOUNTER — Inpatient Hospital Stay: Payer: Medicare Other | Attending: Oncology | Admitting: Oncology

## 2020-07-14 DIAGNOSIS — Z122 Encounter for screening for malignant neoplasm of respiratory organs: Secondary | ICD-10-CM

## 2020-07-14 DIAGNOSIS — Z87891 Personal history of nicotine dependence: Secondary | ICD-10-CM | POA: Diagnosis not present

## 2020-07-14 DIAGNOSIS — J432 Centrilobular emphysema: Secondary | ICD-10-CM | POA: Diagnosis not present

## 2020-07-14 NOTE — Progress Notes (Signed)
Virtual Visit via Video Note  I connected with Mrs. Cuppett on 07/14/20 at  9:00 AM EDT by a video enabled telemedicine application and verified that I am speaking with the correct person using two identifiers.  Location: Patient: Home Provider: Clinic    I discussed the limitations of evaluation and management by telemedicine and the availability of in person appointments. The patient expressed understanding and agreed to proceed.  I discussed the assessment and treatment plan with the patient. The patient was provided an opportunity to ask questions and all were answered. The patient agreed with the plan and demonstrated an understanding of the instructions.   The patient was advised to call back or seek an in-person evaluation if the symptoms worsen or if the condition fails to improve as anticipated.   In accordance with CMS guidelines, patient has met eligibility criteria including age, absence of signs or symptoms of lung cancer.  Social History   Tobacco Use  . Smoking status: Current Every Day Smoker    Packs/day: 1.00    Years: 50.00    Pack years: 50.00    Types: Cigarettes    Last attempt to quit: 12/13/2014    Years since quitting: 5.5  . Smokeless tobacco: Never Used  . Tobacco comment: half to one pack of cigs per day; stopped Chantix; plans to restart  Vaping Use  . Vaping Use: Never used  Substance Use Topics  . Alcohol use: No    Alcohol/week: 0.0 standard drinks  . Drug use: No      A shared decision-making session was conducted prior to the performance of CT scan. This includes one or more decision aids, includes benefits and harms of screening, follow-up diagnostic testing, over-diagnosis, false positive rate, and total radiation exposure.   Counseling on the importance of adherence to annual lung cancer LDCT screening, impact of co-morbidities, and ability or willingness to undergo diagnosis and treatment is imperative for compliance of the program.    Counseling on the importance of continued smoking cessation for former smokers; the importance of smoking cessation for current smokers, and information about tobacco cessation interventions have been given to patient including [place] and [employer] quit [place] programs.   Written order for lung cancer screening with LDCT has been given to the patient and any and all questions have been answered to the best of my abilities.    Yearly follow up will be coordinated by Burgess Estelle, Thoracic Navigator.  I provided 15 minutes of face-to-face video visit time during this encounter, and > 50% was spent counseling as documented under my assessment & plan.   Jacquelin Hawking, NP

## 2020-07-19 ENCOUNTER — Telehealth: Payer: Self-pay | Admitting: *Deleted

## 2020-07-19 DIAGNOSIS — I2584 Coronary atherosclerosis due to calcified coronary lesion: Secondary | ICD-10-CM

## 2020-07-19 DIAGNOSIS — I2510 Atherosclerotic heart disease of native coronary artery without angina pectoris: Secondary | ICD-10-CM | POA: Insufficient documentation

## 2020-07-19 DIAGNOSIS — I700 Atherosclerosis of aorta: Secondary | ICD-10-CM | POA: Insufficient documentation

## 2020-07-19 NOTE — Telephone Encounter (Signed)
Thank you :)

## 2020-07-19 NOTE — Assessment & Plan Note (Signed)
.  ttaorti

## 2020-07-19 NOTE — Telephone Encounter (Signed)
Patient needs appt to discuss CT findings of aortic and coronary atherosclerosis

## 2020-07-19 NOTE — Telephone Encounter (Signed)
Notified patient of LDCT lung cancer screening program results with recommendation for 12 month follow up imaging. Also notified of incidental findings noted below and is encouraged to discuss further with PCP who will receive a copy of this note and/or the CT report. Patient verbalizes understanding.   IMPRESSION: 1. Lung-RADS 2, benign appearance or behavior. Continue annual screening with low-dose chest CT without contrast in 12 months. 2. Aortic Atherosclerosis (ICD10-I70.0) and Emphysema (ICD10-J43.9). Coronary artery atherosclerosis.

## 2020-07-20 NOTE — Telephone Encounter (Signed)
Spoke with pt and informed her of the results of her CT scan and scheduled her an appt.

## 2020-07-31 ENCOUNTER — Other Ambulatory Visit: Payer: Self-pay

## 2020-07-31 ENCOUNTER — Encounter: Payer: Self-pay | Admitting: Internal Medicine

## 2020-07-31 ENCOUNTER — Ambulatory Visit (INDEPENDENT_AMBULATORY_CARE_PROVIDER_SITE_OTHER): Payer: Medicare Other | Admitting: Internal Medicine

## 2020-07-31 VITALS — BP 122/56 | HR 79 | Temp 97.9°F | Ht 60.98 in | Wt 97.4 lb

## 2020-07-31 DIAGNOSIS — Z720 Tobacco use: Secondary | ICD-10-CM | POA: Diagnosis not present

## 2020-07-31 DIAGNOSIS — E441 Mild protein-calorie malnutrition: Secondary | ICD-10-CM

## 2020-07-31 DIAGNOSIS — J449 Chronic obstructive pulmonary disease, unspecified: Secondary | ICD-10-CM

## 2020-07-31 DIAGNOSIS — Z79899 Other long term (current) drug therapy: Secondary | ICD-10-CM | POA: Diagnosis not present

## 2020-07-31 DIAGNOSIS — F409 Phobic anxiety disorder, unspecified: Secondary | ICD-10-CM

## 2020-07-31 DIAGNOSIS — F5105 Insomnia due to other mental disorder: Secondary | ICD-10-CM

## 2020-07-31 DIAGNOSIS — Z23 Encounter for immunization: Secondary | ICD-10-CM | POA: Diagnosis not present

## 2020-07-31 DIAGNOSIS — I700 Atherosclerosis of aorta: Secondary | ICD-10-CM | POA: Diagnosis not present

## 2020-07-31 DIAGNOSIS — I679 Cerebrovascular disease, unspecified: Secondary | ICD-10-CM | POA: Insufficient documentation

## 2020-07-31 MED ORDER — CLONAZEPAM 0.5 MG PO TABS: ORAL_TABLET | ORAL | 5 refills | Status: DC

## 2020-07-31 MED ORDER — SERTRALINE HCL 100 MG PO TABS: 50.0000 mg | ORAL_TABLET | Freq: Every day | ORAL | 1 refills | Status: DC

## 2020-07-31 MED ORDER — BUPROPION HCL ER (XL) 150 MG PO TB24
150.0000 mg | ORAL_TABLET | Freq: Every day | ORAL | 5 refills | Status: DC
Start: 2020-07-31 — End: 2021-01-29

## 2020-07-31 MED ORDER — SPIRIVA HANDIHALER 18 MCG IN CAPS
18.0000 ug | ORAL_CAPSULE | Freq: Every day | RESPIRATORY_TRACT | 12 refills | Status: DC
Start: 2020-07-31 — End: 2021-09-18

## 2020-07-31 MED ORDER — FLUTICASONE-SALMETEROL 250-50 MCG/DOSE IN AEPB
1.0000 | INHALATION_SPRAY | Freq: Two times a day (BID) | RESPIRATORY_TRACT | 3 refills | Status: DC
Start: 2020-07-31 — End: 2021-09-18

## 2020-07-31 MED ORDER — ROSUVASTATIN CALCIUM 10 MG PO TABS: 10.0000 mg | ORAL_TABLET | Freq: Every day | ORAL | 1 refills | Status: DC

## 2020-07-31 NOTE — Assessment & Plan Note (Signed)
Recommending trial of Spiriva and Symbicort

## 2020-07-31 NOTE — Assessment & Plan Note (Signed)
I have reviewed her diet and recommended that she increase her protein and fat intake while monitoring her carbohydrates.

## 2020-07-31 NOTE — Assessment & Plan Note (Signed)
chantix did not help.  Trial of wellbutrin

## 2020-07-31 NOTE — Progress Notes (Signed)
Subjective:  Patient ID: Tamara [name], female    DOB: 01-Mar-1946  Age: 74 y.o. MRN: [id number]  CC: The primary encounter diagnosis was Need for immunization against influenza. Diagnoses of Long-term use of high-risk medication, Tobacco abuse, Emphysema with chronic bronchitis ([place]), Aortic atherosclerosis ([place]), and Cerebrovascular disease were also pertinent to this visit.  HPI Tamara [name] presents for follow up on recent CT chest   This visit occurred during the SARS-CoV-2 public health emergency.  Safety protocols were in place, including screening questions prior to the visit, additional usage of staff PPE, and extensive cleaning of exam room while observing appropriate contact time as indicated for disinfecting solutions.   Reviewed findings of prior lung CT scan today. Discussed the findings of aortic and coronary atherosclerosis and the increased risk of heart attach and stroke this suggests.   Patient smokes,  Did not quit smoking with chantix.  She reports that she becomes dizzy after smoking . She is willing to  Initiate statin therapy starting with 20 mg crestor once daily and advance as tolerated to twice weekly.    Emphysema also noted on CT.  She has reported coughing with yard work.  Has a morning wet cough daily . No prior diagnosis or use of inhaled therapy  . Denies chest pain but has occasional chest tightness, still smoking    Outpatient Medications Prior to Visit  Medication Sig Dispense Refill  . acetaminophen (TYLENOL) 500 MG tablet Take 2 tablets (1,000 mg total) by mouth every 8 (eight) hours. 90 tablet 2  . HYDROcodone-acetaminophen (NORCO) 5-325 MG tablet Take 1-2 tablets by mouth every 4 (four) hours as needed for moderate pain or severe pain. 10 tablet 0  . HYDROcodone-acetaminophen (NORCO/VICODIN) 5-325 MG tablet Take 1 tablet by mouth 4 (four) times daily as needed.    . nortriptyline (PAMELOR) 25 MG capsule TAKE 1 CAPSULE BY MOUTH AT BEDTIME 90 capsule  1  . omeprazole (PRILOSEC) 40 MG capsule TAKE 1 CAPSULE BY MOUTH TWICE DAILY 180 capsule 1  . ondansetron (ZOFRAN ODT) 4 MG disintegrating tablet Take 1 tablet (4 mg total) by mouth every 8 (eight) hours as needed for nausea or vomiting. 20 tablet 0  . traZODone (DESYREL) 50 MG tablet TAKE 2 TABLETS BY MOUTH AT BEDTIME 180 tablet 1  . clonazePAM (KLONOPIN) 0.5 MG tablet TAKE 1 TABLET BY MOUTH ONCE DAILY AS NEEDED ANXIETY 30 tablet 5  . sertraline (ZOLOFT) 100 MG tablet TAKE 1 TABLET BY MOUTH ONCE DAILY 90 tablet 1  . varenicline (CHANTIX STARTING MONTH PAK) 0.5 MG X 11 & 1 MG X 42 tablet Take one 0.5 mg tablet by mouth once daily for 3 days, then increase to one 0.5 mg tablet twice daily for 4 days, then increase to one 1 mg tablet twice daily. (Patient not taking: Reported on 01/26/2020) 53 tablet 0   No facility-administered medications prior to visit.    Review of Systems;  Patient denies headache, fevers, malaise, unintentional weight loss, skin rash, eye pain, sinus congestion and sinus pain, sore throat, dysphagia,  hemoptysis , cough, dyspnea, wheezing, chest pain, palpitations, orthopnea, edema, abdominal pain, nausea, melena, diarrhea, constipation, flank pain, dysuria, hematuria, urinary  Frequency, nocturia, numbness, tingling, seizures,  Focal weakness, Loss of consciousness,  Tremor, insomnia, depression, anxiety, and suicidal ideation.      Objective:  BP (!) 122/56 (BP Location: Left Arm, Patient Position: Sitting)   Pulse 79   Temp 97.9 F (36.6 C)   Ht  5' 0.98" (1.549 m)   Wt 97 lb 6.4 oz (44.2 kg)   SpO2 99%   BMI 18.41 kg/m   BP Readings from Last 3 Encounters:  07/31/20 (!) 122/56  06/22/20 134/71  06/12/20 111/80    Wt Readings from Last 3 Encounters:  07/31/20 97 lb 6.4 oz (44.2 kg)  07/14/20 94 lb (42.6 kg)  06/22/20 95 lb (43.1 kg)    General appearance: alert, cooperative and appears stated age Ears: normal TM's and external ear canals both  ears Throat: lips, mucosa, and tongue normal; teeth and gums normal Neck: no adenopathy, no carotid bruit, supple, symmetrical, trachea midline and thyroid not enlarged, symmetric, no tenderness/mass/nodules Back: symmetric, no curvature. ROM normal. No CVA tenderness. Lungs: clear to auscultation bilaterally Heart: regular rate and rhythm, S1, S2 normal, no murmur, click, rub or gallop Abdomen: soft, non-tender; bowel sounds normal; no masses,  no organomegaly Pulses: 2+ and symmetric Skin: Skin color, texture, turgor normal. No rashes or lesions Lymph nodes: Cervical, supraclavicular, and axillary nodes normal.  No results found for: HGBA1C  Lab Results  Component Value Date   CREATININE 0.73 06/12/2020   CREATININE 0.73 01/26/2020   CREATININE 0.75 07/08/2018    Lab Results  Component Value Date   WBC 10.2 06/12/2020   HGB 13.6 06/12/2020   HCT 39.5 06/12/2020   PLT 263 06/12/2020   GLUCOSE 104 (H) 06/12/2020   CHOL 188 01/26/2020   TRIG 182.0 (H) 01/26/2020   HDL 58.90 01/26/2020   LDLDIRECT 121.1 01/26/2014   LDLCALC 92 01/26/2020   ALT 19 06/12/2020   AST 21 06/12/2020   NA 137 06/12/2020   K 4.1 06/12/2020   CL 105 06/12/2020   CREATININE 0.73 06/12/2020   BUN 10 06/12/2020   CO2 23 06/12/2020   TSH 1.74 06/11/2017   INR 0.85 12/18/2016    CT CHEST LUNG CANCER SCREENING LOW DOSE WO CONTRAST  Result Date: 07/14/2020 CLINICAL DATA:  Fifty pack-year smoking history. Asymptomatic current smoker. EXAM: CT CHEST WITHOUT CONTRAST LOW-DOSE FOR LUNG CANCER SCREENING TECHNIQUE: Multidetector CT imaging of the chest was performed following the standard protocol without IV contrast. COMPARISON:  01/26/2020 chest radiograph. Chest CT 04/16/2007, diagnostic. FINDINGS: Cardiovascular: Aortic atherosclerosis. Normal heart size, without pericardial effusion. Right coronary artery calcification. Mediastinum/Nodes: No mediastinal or definite hilar adenopathy, given limitations of  unenhanced CT. Lungs/Pleura: No pleural fluid. Mild centrilobular emphysema. Bilateral pulmonary nodules of maximally volume derived equivalent diameter 3.9 mm in the subpleural right lower lobe. A density within the right middle lobe is likely an area of mucoid impaction at volume derived equivalent diameter 5.9 mm. Upper Abdomen: Cholecystectomy. Splenectomy. Surgical changes about the gastric body. Normal imaged portions of the pancreas, adrenal glands, kidneys. Musculoskeletal: Osteopenia.  Cervical spine fixation. IMPRESSION: 1. Lung-RADS 2, benign appearance or behavior. Continue annual screening with low-dose chest CT without contrast in 12 months. 2. Aortic Atherosclerosis (ICD10-I70.0) and Emphysema (ICD10-J43.9). Coronary artery atherosclerosis. Electronically Signed   By: Abigail Miyamoto M.D.   On: 07/14/2020 14:22    Assessment & Plan:   Problem List Items Addressed This Visit      Unprioritized   Tobacco abuse    chantix did not help.  Trial of wellbutrin       Emphysema with chronic bronchitis ([place])    Recommending trial of Spiriva and Symbicort      Relevant Medications   tiotropium (SPIRIVA HANDIHALER) 18 MCG inhalation capsule   Fluticasone-Salmeterol (ADVAIR DISKUS) 250-50 MCG/DOSE AEPB  Aortic atherosclerosis ([place])    Reviewed findings of prior CT scan today..  Patient is willing to  Initiate statin therpay starting with 10 mg crestor once daily       Relevant Medications   rosuvastatin (CRESTOR) 10 MG tablet   Cerebrovascular disease    Diffuse, noted on prior MRI,  With transient dizziness occurring with tobacco use .  Tobacco cessation advised,  encouraged to resume asa and start crestor.       Relevant Medications   rosuvastatin (CRESTOR) 10 MG tablet    Other Visit Diagnoses    Need for immunization against influenza    -  Primary   Relevant Orders   Flu Vaccine QUAD High Dose([place]) (Completed)   Long-term use of high-risk medication       Relevant Orders     Comprehensive metabolic panel      I have discontinued Tamara [name]'s varenicline. I have also changed her sertraline. Additionally, I am having her start on buPROPion, rosuvastatin, Spiriva HandiHaler, and Fluticasone-Salmeterol. Lastly, I am having her maintain her omeprazole, traZODone, nortriptyline, HYDROcodone-acetaminophen, acetaminophen, ondansetron, HYDROcodone-acetaminophen, and clonazePAM.  Meds ordered this encounter  Medications  . buPROPion (WELLBUTRIN XL) 150 MG 24 hr tablet    Sig: Take 1 tablet (150 mg total) by mouth daily.    Dispense:  30 tablet    Refill:  5  . rosuvastatin (CRESTOR) 10 MG tablet    Sig: Take 1 tablet (10 mg total) by mouth daily.    Dispense:  90 tablet    Refill:  1  . sertraline (ZOLOFT) 100 MG tablet    Sig: Take 0.5 tablets (50 mg total) by mouth daily.    Dispense:  90 tablet    Refill:  1  . clonazePAM (KLONOPIN) 0.5 MG tablet    Sig: TAKE 1 TABLET BY MOUTH ONCE DAILY AS NEEDED ANXIETY    Dispense:  30 tablet    Refill:  5  . tiotropium (SPIRIVA HANDIHALER) 18 MCG inhalation capsule    Sig: Place 1 capsule (18 mcg total) into inhaler and inhale daily.    Dispense:  30 capsule    Refill:  12  . Fluticasone-Salmeterol (ADVAIR DISKUS) 250-50 MCG/DOSE AEPB    Sig: Inhale 1 puff into the lungs 2 (two) times daily.    Dispense:  1 each    Refill:  3   A total of 40 minutes was spent with patient more than half of which was spent in counseling patient on the above mentioned issues , reviewing and explaining recent labs and imaging studies done, and coordination of care.  Medications Discontinued During This Encounter  Medication Reason  . varenicline (CHANTIX STARTING MONTH PAK) 0.5 MG X 11 & 1 MG X 42 tablet Completed Course  . clonazePAM (KLONOPIN) 0.5 MG tablet Reorder  . sertraline (ZOLOFT) 100 MG tablet     Follow-up: Return in about 4 weeks (around 08/28/2020).   Crecencio Mc, MD

## 2020-07-31 NOTE — Assessment & Plan Note (Signed)
Reviewed findings of prior CT scan today..  Patient is willing to  Initiate statin therpay starting with 10 mg crestor once daily

## 2020-07-31 NOTE — Patient Instructions (Addendum)
You have atherosclerosis in your aorta and your heart arteries.  This means your risk of stroke and heart attack is very high  I am recommending an alternative to chantx: Take the welllbutrin every  morning  With breakfast.  To help you quit smoking  Reduce your sertraline dose to 1/2 tablet in the evening   I am recommending that you also take Crestor for your atherosclerosis AND YOU KEEP TRYING TO QUIT THE CIGARETTES..  Take in the evening   Return in 4 weeks for non fasting blood test liver enzymes)

## 2020-07-31 NOTE — Assessment & Plan Note (Signed)
Diffuse, noted on prior MRI,  With transient dizziness occurring with tobacco use .  Tobacco cessation advised,  encouraged to resume asa and start crestor.

## 2020-07-31 NOTE — Assessment & Plan Note (Signed)
Managed with stable doses of  clonazepam and trazodone.   No medication changes today

## 2020-08-01 NOTE — Telephone Encounter (Signed)
Called and spoke to Tamara [name] about her discomfort. Pt reports no pain, heat, or swelling near injection site. She is having no respiratory symptoms, nor fever. She explains that she is not in any distress or discomfort moving around her house. She is not experiencing any SOB. She states that she just has body aches that started last night and does not feel that she should go to the Urgent care or ER for a same day visit.

## 2020-08-28 ENCOUNTER — Other Ambulatory Visit (INDEPENDENT_AMBULATORY_CARE_PROVIDER_SITE_OTHER): Payer: Medicare Other

## 2020-08-28 ENCOUNTER — Other Ambulatory Visit: Payer: Self-pay

## 2020-08-28 DIAGNOSIS — Z79899 Other long term (current) drug therapy: Secondary | ICD-10-CM

## 2020-08-28 LAB — COMPREHENSIVE METABOLIC PANEL
ALT: 37 U/L — ABNORMAL HIGH (ref 0–35)
AST: 33 U/L (ref 0–37)
Albumin: 4.3 g/dL (ref 3.5–5.2)
Alkaline Phosphatase: 50 U/L (ref 39–117)
BUN: 12 mg/dL (ref 6–23)
CO2: 26 mEq/L (ref 19–32)
Calcium: 9.2 mg/dL (ref 8.4–10.5)
Chloride: 104 mEq/L (ref 96–112)
Creatinine, Ser: 0.72 mg/dL (ref 0.40–1.20)
GFR: 83.18 mL/min (ref >60.00)
Glucose, Bld: 126 mg/dL — ABNORMAL HIGH (ref 70–99)
Potassium: 4.2 mEq/L (ref 3.5–5.1)
Sodium: 139 mEq/L (ref 135–145)
Total Bilirubin: 0.5 mg/dL (ref 0.2–1.2)
Total Protein: 6.2 g/dL (ref 6.0–8.3)

## 2020-08-29 ENCOUNTER — Other Ambulatory Visit: Payer: Self-pay | Admitting: Internal Medicine

## 2020-08-29 ENCOUNTER — Telehealth: Payer: Self-pay

## 2020-08-29 DIAGNOSIS — R7401 Elevation of levels of liver transaminase levels: Secondary | ICD-10-CM

## 2020-08-29 DIAGNOSIS — R7301 Impaired fasting glucose: Secondary | ICD-10-CM

## 2020-08-29 NOTE — Telephone Encounter (Signed)
LMTCB in regards to lab results.  

## 2020-08-29 NOTE — Progress Notes (Signed)
One of Your liver enzyme is a tiny bit  elevated, for unclear reasons  I would like you to return in a month for a lab draw to run the liver test again .  You do not need to be fasting, but should call the office for a lab appt.

## 2020-09-07 ENCOUNTER — Other Ambulatory Visit: Payer: Self-pay | Admitting: Internal Medicine

## 2020-09-07 ENCOUNTER — Other Ambulatory Visit (HOSPITAL_COMMUNITY): Payer: Self-pay | Admitting: Internal Medicine

## 2020-09-07 DIAGNOSIS — K50012 Crohn's disease of small intestine with intestinal obstruction: Secondary | ICD-10-CM

## 2020-09-07 DIAGNOSIS — R748 Abnormal levels of other serum enzymes: Secondary | ICD-10-CM | POA: Diagnosis not present

## 2020-09-07 DIAGNOSIS — K2100 Gastro-esophageal reflux disease with esophagitis, without bleeding: Secondary | ICD-10-CM | POA: Diagnosis not present

## 2020-09-07 DIAGNOSIS — K50919 Crohn's disease, unspecified, with unspecified complications: Secondary | ICD-10-CM

## 2020-09-07 DIAGNOSIS — R197 Diarrhea, unspecified: Secondary | ICD-10-CM | POA: Diagnosis not present

## 2020-09-07 DIAGNOSIS — R634 Abnormal weight loss: Secondary | ICD-10-CM | POA: Diagnosis not present

## 2020-09-07 DIAGNOSIS — K5000 Crohn's disease of small intestine without complications: Secondary | ICD-10-CM | POA: Diagnosis not present

## 2020-09-08 DIAGNOSIS — Z9889 Other specified postprocedural states: Secondary | ICD-10-CM | POA: Diagnosis not present

## 2020-09-08 DIAGNOSIS — R197 Diarrhea, unspecified: Secondary | ICD-10-CM | POA: Diagnosis not present

## 2020-09-08 DIAGNOSIS — K5000 Crohn's disease of small intestine without complications: Secondary | ICD-10-CM | POA: Diagnosis not present

## 2020-09-12 ENCOUNTER — Ambulatory Visit
Admission: RE | Admit: 2020-09-12 | Discharge: 2020-09-12 | Disposition: A | Discharge status: Home / Self Care | Payer: Medicare Other | Source: Ambulatory Visit | Attending: Internal Medicine | Admitting: Internal Medicine

## 2020-09-12 ENCOUNTER — Other Ambulatory Visit: Payer: Self-pay

## 2020-09-12 DIAGNOSIS — K50012 Crohn's disease of small intestine with intestinal obstruction: Secondary | ICD-10-CM | POA: Diagnosis not present

## 2020-09-12 DIAGNOSIS — K50919 Crohn's disease, unspecified, with unspecified complications: Secondary | ICD-10-CM | POA: Diagnosis not present

## 2020-09-12 DIAGNOSIS — K838 Other specified diseases of biliary tract: Secondary | ICD-10-CM | POA: Diagnosis not present

## 2020-09-12 DIAGNOSIS — K5900 Constipation, unspecified: Secondary | ICD-10-CM | POA: Diagnosis not present

## 2020-09-12 DIAGNOSIS — K6389 Other specified diseases of intestine: Secondary | ICD-10-CM | POA: Diagnosis not present

## 2020-09-12 DIAGNOSIS — K5130 Ulcerative (chronic) rectosigmoiditis without complications: Secondary | ICD-10-CM | POA: Diagnosis not present

## 2020-09-12 MED ORDER — IOHEXOL 300 MG/ML  SOLN
75.0000 mL | Freq: Once | INTRAMUSCULAR | Status: AC | PRN
  Administered 2020-09-12: 75 mL via INTRAVENOUS

## 2020-09-15 ENCOUNTER — Ambulatory Visit: Admission: RE | Admit: 2020-09-15 | Payer: Medicare Other | Source: Ambulatory Visit

## 2020-09-18 ENCOUNTER — Encounter: Payer: Self-pay | Admitting: Oncology

## 2020-09-18 ENCOUNTER — Inpatient Hospital Stay: Payer: Medicare Other | Attending: Oncology | Admitting: Oncology

## 2020-09-18 ENCOUNTER — Other Ambulatory Visit: Payer: Self-pay

## 2020-09-18 VITALS — BP 120/80 | HR 73 | Temp 97.4°F | Resp 18 | Wt 104.4 lb

## 2020-09-18 DIAGNOSIS — K5900 Constipation, unspecified: Secondary | ICD-10-CM | POA: Diagnosis not present

## 2020-09-18 DIAGNOSIS — Z833 Family history of diabetes mellitus: Secondary | ICD-10-CM | POA: Insufficient documentation

## 2020-09-18 DIAGNOSIS — Z87891 Personal history of nicotine dependence: Secondary | ICD-10-CM

## 2020-09-18 DIAGNOSIS — J449 Chronic obstructive pulmonary disease, unspecified: Secondary | ICD-10-CM | POA: Diagnosis not present

## 2020-09-18 DIAGNOSIS — K219 Gastro-esophageal reflux disease without esophagitis: Secondary | ICD-10-CM | POA: Diagnosis not present

## 2020-09-18 DIAGNOSIS — Z86718 Personal history of other venous thrombosis and embolism: Secondary | ICD-10-CM | POA: Diagnosis not present

## 2020-09-18 DIAGNOSIS — K838 Other specified diseases of biliary tract: Secondary | ICD-10-CM | POA: Insufficient documentation

## 2020-09-18 DIAGNOSIS — Z7951 Long term (current) use of inhaled steroids: Secondary | ICD-10-CM | POA: Diagnosis not present

## 2020-09-18 DIAGNOSIS — F329 Major depressive disorder, single episode, unspecified: Secondary | ICD-10-CM | POA: Diagnosis not present

## 2020-09-18 DIAGNOSIS — Z8719 Personal history of other diseases of the digestive system: Secondary | ICD-10-CM

## 2020-09-18 DIAGNOSIS — Z8249 Family history of ischemic heart disease and other diseases of the circulatory system: Secondary | ICD-10-CM | POA: Diagnosis not present

## 2020-09-18 DIAGNOSIS — Z903 Acquired absence of stomach [part of]: Secondary | ICD-10-CM | POA: Diagnosis not present

## 2020-09-18 DIAGNOSIS — Z79899 Other long term (current) drug therapy: Secondary | ICD-10-CM | POA: Diagnosis not present

## 2020-09-18 DIAGNOSIS — K5000 Crohn's disease of small intestine without complications: Secondary | ICD-10-CM | POA: Diagnosis not present

## 2020-09-18 DIAGNOSIS — D72829 Elevated white blood cell count, unspecified: Secondary | ICD-10-CM | POA: Diagnosis not present

## 2020-09-18 DIAGNOSIS — Z90710 Acquired absence of both cervix and uterus: Secondary | ICD-10-CM | POA: Diagnosis not present

## 2020-09-18 DIAGNOSIS — Z9049 Acquired absence of other specified parts of digestive tract: Secondary | ICD-10-CM | POA: Insufficient documentation

## 2020-09-18 NOTE — Progress Notes (Signed)
Patient reports that she is feeling "bad" with no energy and thinks she may be dehydrated.

## 2020-09-18 NOTE — Progress Notes (Signed)
Hematology/Oncology [facility] [phone] [phone]   Patient Care Team: Crecencio Mc, MD as PCP - General (Internal Medicine)  REFERRING PROVIDER: Crecencio Mc, MD  CHIEF COMPLAINTS/REASON FOR VISIT:  Follow-up for leukocytosis  HISTORY OF PRESENTING ILLNESS:  Tamara [name] is a  74 y.o.  female with PMH listed below who was seen at the request of Crecencio Mc, MD/Tullo, Aris Everts, MD for evaluation of leukocytosis.   Reviewed patient' recent labs ordered by [facility] gastroenterology group via care everywhere. 05/26/2019 CBC showed elevated white count of 13.3, predominately lymphocytosis with increased lymphocyte percentage 50.6, absolute lymphocyte 6.73. A pathology review was done and the result was not available to me. Other studies includes iron panel showed iron saturation 20.  Ferritin 11 Nonremarkable CMP, TSH 2.23 Previous lab records reviewed. Leukocytosis onset of chronic, duration is since  Vitamin D level 21.5, CRP< 1 Vitamin B12 level was low.  Patient started B12 injections. Patient reports that she has been chronically having high white blood cell count.  Recalled that she had work-up over a decade ago.  Details unclear. Patient has a history of gastroesophageal reflux disease, history of Crohn's disease of the small intestine diagnosed with capsule endoscopy in 2005.  History of bowel obstructions.  She has been in remission since 2016 currently off any Crohn's disease medication.  Most recent colonoscopy 05/10/2019 by Dr. Vira Agar showed polyps in the descending colon and the rectum which were removed.  Pathology showed sessile serrated polyp and hyperplastic polyp.  Negative for dysplasia, malignancy.   No aggravating or elevated factors. Associated symptoms or signs:  Denies weight loss, fever, chills,night sweats.  Denies any fatigue. Smoking history: Current daily smoker, 50-pack-year smoking  history. History of recent oral steroid use or steroid injection:  History of recent infection: Denies any recent infection. Autoimmune disease history.  Crohn's disease  Today she reports feeling well at baseline.  No fever, chills, unintentional weight loss.  Appetite is good. Reports having sweats at night for a few months. Chronic epigastric discomfort.  INTERVAL HISTORY Tamara [name] is a 74 y.o. female who has above history reviewed by me today presents for follow up visit for management of leukocytosis Problems and complaints are listed below: She reports feeling tired and fatigued. She continues to follow-up with gastroenterology and recently had CT scan done Patient showed me bruises at the IV sites.  She says that she might be dehydrated.  No nausea vomiting diarrhea. 09/12/2020 CT entero-ABD/pelvis showed findings consistent with potential proctocolitis with rectal and distal colonic mural stratification and mild mucosal hyperenhancement.  Postoperative changes of the right gluteal region.  Persistent biliary ductal distention alternating in severity.  Post partial gastrectomy and splenectomy No bowel obstruction but with distended loop of bowel adjacent to anastomosis likely related to anastomotic changes.  Right iliac vein system with diminutive appearance which may relate to prior DVT.  Aortic atherosclerosis. 07/14/2000, chest lung cancer screening showed lung RADS 2 benign appearance.  Patient's weight has been stable since last visit.  She has gained 7 pounds since September.   Review of Systems  Constitutional: Positive for fatigue. Negative for appetite change, chills, fever and unexpected weight change.  HENT:   Negative for hearing loss and voice change.   Eyes: Negative for eye problems.  Respiratory: Negative for chest tightness and cough.   Cardiovascular: Negative for chest pain.  Gastrointestinal: Negative for abdominal distention, abdominal pain and blood in  stool.  Chronic epigastric discomfort/acid reflux  Endocrine: Negative for hot flashes.  Genitourinary: Negative for difficulty urinating and frequency.   Musculoskeletal: Negative for arthralgias.  Skin: Negative for itching and rash.  Neurological: Negative for extremity weakness.  Hematological: Negative for adenopathy.  Psychiatric/Behavioral: Negative for confusion.     MEDICAL HISTORY:  Past Medical History:  Diagnosis Date  . Bowel obstruction ([place])    s/p laparotomy/ LOA 3/08, prior reversal of jejunal bypasss 2004  . Chronic abdominal pain   . COPD (chronic obstructive pulmonary disease) ([place])   . Crohn's disease ([place])    remission 2016  . Depression   . DVT (deep venous thrombosis) ([place]) 04/24/2018  . Dysphagia 12/04/2017  . GERD (gastroesophageal reflux disease)   . Headache   . History of DVT (deep vein thrombosis) 01/2007   post operative right leg, s/p vena cava filter  . Leukocytosis   . Leukocytosis   . Osteoporosis     SURGICAL HISTORY: Past Surgical History:  Procedure Laterality Date  . ABDOMINAL HYSTERECTOMY    . APPENDECTOMY    . CARPAL TUNNEL RELEASE    . CATARACT EXTRACTION    . CERVICAL FUSION  2005   C6-7, anterior approach  . CHOLECYSTECTOMY    . COLONOSCOPY  2015  . COLONOSCOPY WITH PROPOFOL N/A 05/10/2019   Procedure: COLONOSCOPY WITH PROPOFOL;  Surgeon: Manya Silvas, MD;  Location: [facility] ENDOSCOPY;  Service: Endoscopy;  Laterality: N/A;  . ESOPHAGEAL MANOMETRY N/A 02/11/2018   Procedure: ESOPHAGEAL MANOMETRY (EM);  Surgeon: Lucilla Lame, MD;  Location: [facility] ENDOSCOPY;  Service: Endoscopy;  Laterality: N/A;  . ESOPHAGOGASTRODUODENOSCOPY (EGD) WITH PROPOFOL N/A 12/17/2017   Procedure: ESOPHAGOGASTRODUODENOSCOPY (EGD) WITH PROPOFOL;  Surgeon: Robert Bellow, MD;  Location: [facility] ENDOSCOPY;  Service: Endoscopy;  Laterality: N/A;  . ESOPHAGOGASTRODUODENOSCOPY (EGD) WITH PROPOFOL N/A 01/08/2018   Procedure: ESOPHAGOGASTRODUODENOSCOPY (EGD)  WITH PROPOFOL;  Surgeon: Robert Bellow, MD;  Location: [facility] ENDOSCOPY;  Service: Endoscopy;  Laterality: N/A;  . gyn surgery     hysterectomy  . KNEE ARTHROSCOPY WITH MEDIAL MENISECTOMY Left 06/22/2020   Procedure: Left knee arthroscopic partial medial meniscectomy;  Surgeon: Leim Fabry, MD;  Location: [place];  Service: Orthopedics;  Laterality: Left;  . LAPAROTOMY  01/30/07   for bowel obstruction with LOA  . LUMBAR LAMINECTOMY/DECOMPRESSION MICRODISCECTOMY N/A 12/25/2016   Procedure: LUMBAR DECOMPRESSION L4-5;  Surgeon: Meade Maw, MD;  Location: [facility] ORS;  Service: Neurosurgery;  Laterality: N/A;  . NISSEN FUNDOPLICATION    . ROTATOR CUFF REPAIR     right  . SPLENECTOMY     secondary to colonoscopy prep  . TONSILLECTOMY      SOCIAL HISTORY: Social History   Socioeconomic History  . Marital status: Widowed    Spouse name: Not on file  . Number of children: Not on file  . Years of education: Not on file  . Highest education level: Not on file  Occupational History  . Not on file  Tobacco Use  . Smoking status: Former Smoker    Packs/day: 1.00    Years: 50.00    Pack years: 50.00    Types: Cigarettes    Quit date: 12/13/2014    Years since quitting: 5.7  . Smokeless tobacco: Never Used  Vaping Use  . Vaping Use: Never used  Substance and Sexual Activity  . Alcohol use: No    Alcohol/week: 0.0 standard drinks  . Drug use: No  . Sexual activity: Not on file  Other Topics Concern  .  Not on file  Social History Narrative   Lives with spouse.   Social Determinants of Health   Financial Resource Strain:   . Difficulty of Paying Living Expenses: Not on file  Food Insecurity:   . Worried About Charity fundraiser in the Last Year: Not on file  . Ran Out of Food in the Last Year: Not on file  Transportation Needs:   . Lack of Transportation (Medical): Not on file  . Lack of Transportation (Non-Medical): Not on file  Physical Activity:   . Days  of Exercise per Week: Not on file  . Minutes of Exercise per Session: Not on file  Stress:   . Feeling of Stress : Not on file  Social Connections:   . Frequency of Communication with Friends and Family: Not on file  . Frequency of Social Gatherings with Friends and Family: Not on file  . Attends Religious Services: Not on file  . Active Member of Clubs or Organizations: Not on file  . Attends Archivist Meetings: Not on file  . Marital Status: Not on file  Intimate Partner Violence:   . Fear of Current or Ex-Partner: Not on file  . Emotionally Abused: Not on file  . Physically Abused: Not on file  . Sexually Abused: Not on file    FAMILY HISTORY: Family History  Problem Relation Age of Onset  . Diabetes Mother        type 2  . Heart disease Mother   . Hypertension Mother   . Coronary artery disease Father   . Heart attack Sister     ALLERGIES:  is allergic to sulfa antibiotics.  MEDICATIONS:  Current Outpatient Medications  Medication Sig Dispense Refill  . clonazePAM (KLONOPIN) 0.5 MG tablet TAKE 1 TABLET BY MOUTH ONCE DAILY AS NEEDED ANXIETY 30 tablet 5  . Fluticasone-Salmeterol (ADVAIR DISKUS) 250-50 MCG/DOSE AEPB Inhale 1 puff into the lungs 2 (two) times daily. 1 each 3  . nortriptyline (PAMELOR) 25 MG capsule TAKE 1 CAPSULE BY MOUTH AT BEDTIME 90 capsule 1  . omeprazole (PRILOSEC) 40 MG capsule TAKE 1 CAPSULE BY MOUTH TWICE DAILY 180 capsule 1  . ondansetron (ZOFRAN ODT) 4 MG disintegrating tablet Take 1 tablet (4 mg total) by mouth every 8 (eight) hours as needed for nausea or vomiting. 20 tablet 0  . rosuvastatin (CRESTOR) 10 MG tablet Take 1 tablet (10 mg total) by mouth daily. 90 tablet 1  . sertraline (ZOLOFT) 100 MG tablet Take 0.5 tablets (50 mg total) by mouth daily. 90 tablet 1  . tiotropium (SPIRIVA HANDIHALER) 18 MCG inhalation capsule Place 1 capsule (18 mcg total) into inhaler and inhale daily. 30 capsule 12  . traZODone (DESYREL) 50 MG tablet  TAKE 2 TABLETS BY MOUTH AT BEDTIME 180 tablet 1  . acetaminophen (TYLENOL) 500 MG tablet Take 2 tablets (1,000 mg total) by mouth every 8 (eight) hours. 90 tablet 2  . buPROPion (WELLBUTRIN XL) 150 MG 24 hr tablet Take 1 tablet (150 mg total) by mouth daily. 30 tablet 5  . HYDROcodone-acetaminophen (NORCO) 5-325 MG tablet Take 1-2 tablets by mouth every 4 (four) hours as needed for moderate pain or severe pain. 10 tablet 0  . HYDROcodone-acetaminophen (NORCO/VICODIN) 5-325 MG tablet Take 1 tablet by mouth 4 (four) times daily as needed.     No current facility-administered medications for this visit.     PHYSICAL EXAMINATION: ECOG PERFORMANCE STATUS: 1 - Symptomatic but completely ambulatory Vitals:   09/18/20  1021  BP: 120/80  Pulse: 73  Resp: 18  Temp: (!) 97.4 F (36.3 C)   Filed Weights   09/18/20 1021  Weight: 104 lb 6.4 oz (47.4 kg)    Physical Exam Constitutional:      General: She is not in acute distress. HENT:     Head: Normocephalic and atraumatic.  Eyes:     General: No scleral icterus.    Pupils: Pupils are equal, round, and reactive to light.  Cardiovascular:     Rate and Rhythm: Normal rate and regular rhythm.     Heart sounds: Normal heart sounds.  Pulmonary:     Effort: Pulmonary effort is normal. No respiratory distress.     Breath sounds: No wheezing.  Abdominal:     General: Bowel sounds are normal. There is no distension.     Palpations: Abdomen is soft. There is no mass.     Tenderness: There is no abdominal tenderness.  Musculoskeletal:        General: No deformity. Normal range of motion.     Cervical back: Normal range of motion and neck supple.  Skin:    General: Skin is warm and dry.     Findings: No erythema or rash.  Neurological:     Mental Status: She is alert and oriented to person, place, and time. Mental status is at baseline.     Cranial Nerves: No cranial nerve deficit.     Coordination: Coordination normal.  Psychiatric:         Mood and Affect: Mood normal.     CMP Latest Ref Rng & Units 08/28/2020  Glucose 70 - 99 mg/dL 126(H)  BUN 6 - 23 mg/dL 12  Creatinine 0.40 - 1.20 mg/dL 0.72  Sodium 135 - 145 mEq/L 139  Potassium 3.5 - 5.1 mEq/L 4.2  Chloride 96 - 112 mEq/L 104  CO2 19 - 32 mEq/L 26  Calcium 8.4 - 10.5 mg/dL 9.2  Total Protein 6.0 - 8.3 g/dL 6.2  Total Bilirubin 0.2 - 1.2 mg/dL 0.5  Alkaline Phos 39 - 117 U/L 50  AST 0 - 37 U/L 33  ALT 0 - 35 U/L 37(H)   CBC Latest Ref Rng & Units 06/12/2020  WBC 4.0 - 10.5 K/uL 10.2  Hemoglobin 12.0 - 15.0 g/dL 13.6  Hematocrit 36 - 46 % 39.5  Platelets 150 - 400 K/uL 263     CT ENTERO ABD/PELVIS W CONTAST  Result Date: 09/12/2020 CLINICAL DATA:  Severe bloating after eating with constipation and diarrhea for 1 month. EXAM: CT ABDOMEN AND PELVIS WITH CONTRAST (ENTEROGRAPHY) TECHNIQUE: Multidetector CT of the abdomen and pelvis during bolus administration of intravenous contrast. Negative oral contrast was given. CONTRAST:  69m OMNIPAQUE IOHEXOL 300 MG/ML  SOLN COMPARISON:  CT of January 21, 2013 FINDINGS: Lower chest:  Lung bases are clear.  No effusion.  No consolidation. Hepatobiliary: Liver without focal lesion. Patent portal vein. Post cholecystectomy with mild dilation of the common bile duct. Biliary duct dilation is less than in 2012 perhaps slightly more when compared to the study of 2014, common bile duct measuring approximately 8-9 mm greatest caliber. Intrahepatic biliary tree in the LEFT lobe with minimal dilation grossly unchanged compared to previous imaging. Pancreas: No pancreatic ductal dilation or sign of inflammation. Spleen: Spleen is surgically absent. Adrenals/Urinary Tract: Adrenal glands are normal. Symmetric renal enhancement. No sign of hydronephrosis. Urinary bladder under distended grossly unremarkable. Stomach/Bowel: Post partial gastrectomy and splenectomy. Mildly patulous distal esophagus, partially imaged.  Distension of small bowel loops  with ingested contrast is intended but with more profound dilation of a small bowel anastomosis in the upper abdomen. Dilated at the anastomotic level but without perienteric stranding. In bound in out bound bowel loops are normal in caliber. No perienteric stranding. No sign of abscess or fistula. Areas of alternating dilation and mild narrowing in the colon without dilation to suggest obstruction in the colon. Mural stratification of both rectum and descending colon best seen on image 127 of series 4 and tracking throughout the rectum. Vascular/Lymphatic: Calcified and noncalcified atheromatous plaque in the abdominal aorta. Patent abdominal vasculature. There is no gastrohepatic or hepatoduodenal ligament lymphadenopathy. No retroperitoneal or mesenteric lymphadenopathy. No pelvic sidewall lymphadenopathy. Diminutive RIGHT iliac venous system may relate to prior DVT. Venous structures not well assessed. Reproductive: Post hysterectomy. Other: Postoperative changes in the RIGHT gluteal region tracking towards the perineum may relate to prior fistula repair showing no change from previous imaging and without surrounding stranding or abscess in this location. Musculoskeletal: No acute bone finding. No destructive bone process. Spinal degenerative changes and osteopenia. IMPRESSION: 1. Findings of potential proctocolitis in this patient with Crohn's disease with rectal and distal colonic mural stratification and mild mucosal hyperenhancement. 2. Postoperative changes in the RIGHT gluteal region likely related to prior perianal fistula repair, correlate clinically. 3. Persistent biliary duct distension, alternating in severity. Correlate with any laboratory evidence of biliary dysfunction. Current caliber is less than 1 cm, within the normal range post cholecystectomy. 4. No overt signs of enteritis. 5. Post partial gastrectomy and splenectomy. 6. No sign of bowel obstruction but with distended loop of bowel adjacent  to anastomosis likely related to anastomotic changes with similar appearance to prior imaging studies. 7. RIGHT iliac venous system with diminutive appearance may relate to prior DVT. Venous structures not well assessed. 8. Aortic atherosclerosis. Aortic Atherosclerosis (ICD10-I70.0).  The the Electronically Signed   By: Zetta Bills M.D.   On: 09/12/2020 14:39    LABORATORY DATA:  I have reviewed the data as listed Lab Results  Component Value Date   WBC 10.2 06/12/2020   HGB 13.6 06/12/2020   HCT 39.5 06/12/2020   MCV 100.0 06/12/2020   PLT 263 06/12/2020   Recent Labs    01/26/20 0852 06/12/20 0930 08/28/20 0824  NA 136 137 139  K 4.2 4.1 4.2  CL 101 105 104  CO2 25 23 26   GLUCOSE 94 104* 126*  BUN 11 10 12   CREATININE 0.73 0.73 0.72  CALCIUM 9.2 8.9 9.2  GFRNONAA  --  >60  --   GFRAA  --  >60  --   PROT 6.2 6.6 6.2  ALBUMIN 4.2 4.5 4.3  AST 18 21 33  ALT 13 19 37*  ALKPHOS 36* 49 50  BILITOT 0.4 0.9 0.5   Iron/TIBC/Ferritin/ %Sat    Component Value Date/Time   IRON 151 (H) 06/11/2017 1022   IRONPCTSAT 33.4 06/11/2017 1022        ASSESSMENT & PLAN:  1. Personal history of tobacco use, presenting hazards to health   2. History of Crohn's disease   3. Leukocytosis, unspecified type    #Leukocytosis, previous work-up was not remarkable. This is likely reactive due to chronic inflammation, secondary from Crohn's disease.  #History of Crohn's disease, patient has reestablish care with gastroenterology. Recent CT scan was independent reviewed by me and discussed with patient. Consistent with chronic inflammation of her bowels. Recommend patient to follow-up with GI.  For colonoscopy  and EGD  #Persistent biliary duct dilatation, possible likely due to postcholecystectomy changes.  Follow-up with GI Bilirubin level is normal.  #Tobacco abuse, smoke cessation was discussed.  Lung cancer screening showed no evidence of lung cancer. I will discharge patient to  continue follow-up with PCP and gastroenterology. All questions were answered. The patient knows to call the clinic with any problems questions or concerns.   Earlie Server, MD, PhD Hematology Oncology [facility] Pager- [phone] 09/18/2020

## 2020-09-21 ENCOUNTER — Other Ambulatory Visit (INDEPENDENT_AMBULATORY_CARE_PROVIDER_SITE_OTHER): Payer: Medicare Other

## 2020-09-21 ENCOUNTER — Other Ambulatory Visit: Payer: Self-pay

## 2020-09-21 DIAGNOSIS — R7301 Impaired fasting glucose: Secondary | ICD-10-CM | POA: Diagnosis not present

## 2020-09-21 DIAGNOSIS — R7401 Elevation of levels of liver transaminase levels: Secondary | ICD-10-CM

## 2020-09-21 LAB — HEPATIC FUNCTION PANEL
ALT: 38 U/L — ABNORMAL HIGH (ref 0–35)
AST: 30 U/L (ref 0–37)
Albumin: 4.2 g/dL (ref 3.5–5.2)
Alkaline Phosphatase: 56 U/L (ref 39–117)
Bilirubin, Direct: 0.1 mg/dL (ref 0.0–0.3)
Total Bilirubin: 0.5 mg/dL (ref 0.2–1.2)
Total Protein: 5.8 g/dL — ABNORMAL LOW (ref 6.0–8.3)

## 2020-09-21 LAB — HEMOGLOBIN A1C: Hgb A1c MFr Bld: 6.1 % (ref 4.6–6.5)

## 2020-09-22 DIAGNOSIS — Z01818 Encounter for other preprocedural examination: Secondary | ICD-10-CM | POA: Diagnosis not present

## 2020-09-22 NOTE — Addendum Note (Signed)
Addended by: Crecencio Mc on: 09/22/2020 09:20 AM   Modules accepted: Orders

## 2020-09-22 NOTE — Progress Notes (Signed)
°  Your liver enzyme remains mildly elevated for unclear reasons.   I recommend that you return  for additional blood tests  to rule out various infections and autoimmune disorders that can cause elevated liver enzymes (hepatitis, hemochromatosis, etc) .  Most of the times theses tests turn out to be normal and the cause is unknown, or a condition called fatty liver.   There was no mention of this appearance on your recent CT scan , so no imaging is needed,  just the labs . If the labs are abnormal,  I will recommend that we follow up with a rheumatology evaluation (or infectious disease, depending on which labs are abnormal.)  please call the office when convenient, to set up the lab appt.   Regards,    Deborra Medina, MD

## 2020-09-26 ENCOUNTER — Other Ambulatory Visit: Payer: Medicare Other

## 2020-09-26 DIAGNOSIS — K640 First degree hemorrhoids: Secondary | ICD-10-CM | POA: Diagnosis not present

## 2020-09-26 DIAGNOSIS — R1030 Lower abdominal pain, unspecified: Secondary | ICD-10-CM | POA: Diagnosis not present

## 2020-09-26 DIAGNOSIS — K219 Gastro-esophageal reflux disease without esophagitis: Secondary | ICD-10-CM | POA: Diagnosis not present

## 2020-09-26 DIAGNOSIS — K5000 Crohn's disease of small intestine without complications: Secondary | ICD-10-CM | POA: Diagnosis not present

## 2020-09-26 DIAGNOSIS — K5090 Crohn's disease, unspecified, without complications: Secondary | ICD-10-CM | POA: Diagnosis not present

## 2020-09-26 DIAGNOSIS — R197 Diarrhea, unspecified: Secondary | ICD-10-CM | POA: Diagnosis not present

## 2020-09-26 LAB — HM COLONOSCOPY

## 2020-10-17 DIAGNOSIS — S6992XA Unspecified injury of left wrist, hand and finger(s), initial encounter: Secondary | ICD-10-CM | POA: Diagnosis not present

## 2020-10-17 DIAGNOSIS — Z8673 Personal history of transient ischemic attack (TIA), and cerebral infarction without residual deficits: Secondary | ICD-10-CM | POA: Diagnosis not present

## 2020-10-17 DIAGNOSIS — Z79899 Other long term (current) drug therapy: Secondary | ICD-10-CM | POA: Diagnosis not present

## 2020-10-17 DIAGNOSIS — R519 Headache, unspecified: Secondary | ICD-10-CM | POA: Diagnosis not present

## 2020-10-17 DIAGNOSIS — E785 Hyperlipidemia, unspecified: Secondary | ICD-10-CM | POA: Diagnosis not present

## 2020-10-17 DIAGNOSIS — Z87891 Personal history of nicotine dependence: Secondary | ICD-10-CM | POA: Diagnosis not present

## 2020-10-17 DIAGNOSIS — K219 Gastro-esophageal reflux disease without esophagitis: Secondary | ICD-10-CM | POA: Diagnosis not present

## 2020-10-17 DIAGNOSIS — M8588 Other specified disorders of bone density and structure, other site: Secondary | ICD-10-CM | POA: Diagnosis not present

## 2020-10-17 DIAGNOSIS — Z86718 Personal history of other venous thrombosis and embolism: Secondary | ICD-10-CM | POA: Diagnosis not present

## 2020-10-17 DIAGNOSIS — S0990XA Unspecified injury of head, initial encounter: Secondary | ICD-10-CM | POA: Diagnosis not present

## 2020-10-17 DIAGNOSIS — K5090 Crohn's disease, unspecified, without complications: Secondary | ICD-10-CM | POA: Diagnosis not present

## 2020-10-17 DIAGNOSIS — Z882 Allergy status to sulfonamides status: Secondary | ICD-10-CM | POA: Diagnosis not present

## 2020-10-17 DIAGNOSIS — F32A Depression, unspecified: Secondary | ICD-10-CM | POA: Diagnosis not present

## 2020-10-17 DIAGNOSIS — Z86711 Personal history of pulmonary embolism: Secondary | ICD-10-CM | POA: Diagnosis not present

## 2020-10-17 DIAGNOSIS — M25532 Pain in left wrist: Secondary | ICD-10-CM | POA: Diagnosis not present

## 2020-10-17 DIAGNOSIS — S52502A Unspecified fracture of the lower end of left radius, initial encounter for closed fracture: Secondary | ICD-10-CM | POA: Diagnosis not present

## 2020-10-19 ENCOUNTER — Telehealth: Payer: Self-pay | Admitting: Internal Medicine

## 2020-10-19 NOTE — Telephone Encounter (Signed)
Spoke with pt and she stated that she has reached out to the orthopedic so she is scheduled to see him on the 17th. Pt asked that we cancel the appt on the 20th with Dr. Derrel Nip. Appt canceled.

## 2020-10-19 NOTE — Telephone Encounter (Signed)
Pt sent a message through a pt schedule message stating that she would like to see about getting a rx for tylenol with codeine in it. Pt stated that she was seen at the ER on 10/17/2020 and diagnosed with a concussion. Pt is having headaches from the concussion.

## 2020-10-19 NOTE — Telephone Encounter (Signed)
Patient called in wanted a prescription for Tylenol with codone she stated that she had already spoke with Janett Billow about her headaches

## 2020-10-19 NOTE — Telephone Encounter (Signed)
I do not prescribe controlled substances without an evaluation.

## 2020-10-22 IMAGING — MR MRI HEAD WITHOUT CONTRAST
12 series · 42 of 48 positions shown · non-contrast
Comparison: Brain MRI 12/24/2014.  Head CT 08/26/2016.

CLINICAL DATA: 72-year-old female with acute headache and
dizziness. Frontal sinus pressure worse x2 weeks. No known injury.

EXAM:
MRI HEAD WITHOUT CONTRAST
TECHNIQUE: Multiplanar, multiecho pulse sequences of the brain and surrounding
structures were obtained without intravenous contrast.

[Series 5: ax dwi_tracew · axial · 3.0mm · 0.60mm/px · z∈[-50,+112]mm · 4 of 55 slices shown]
[im 1/55]
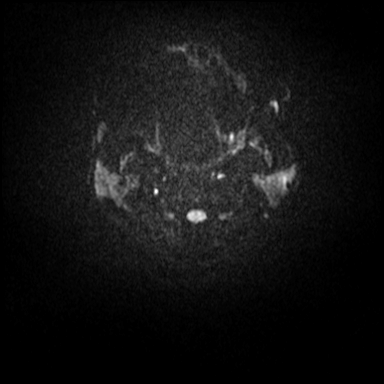
[im 19/55]
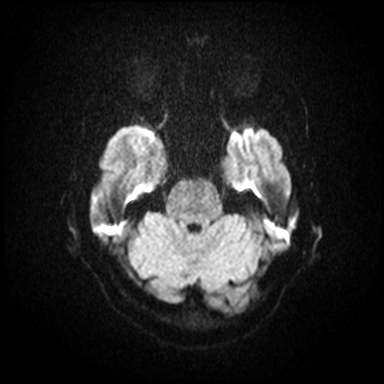
[im 37/55]
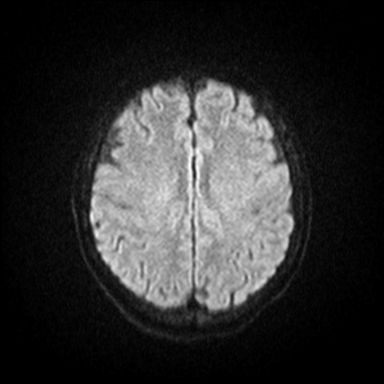
[im 55/55]
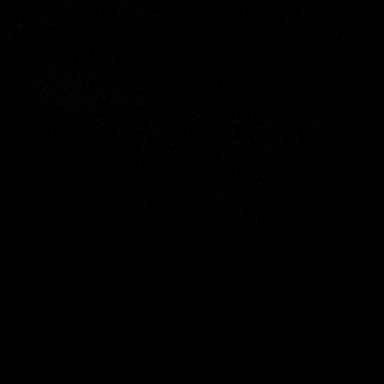

[Series 6: ax dwi_adc · axial · 3.0mm · 0.60mm/px · z∈[-50,+109]mm · 4 of 54 slices shown]
[im 1/54]
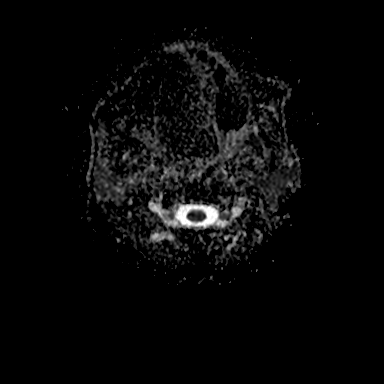
[im 18/54]
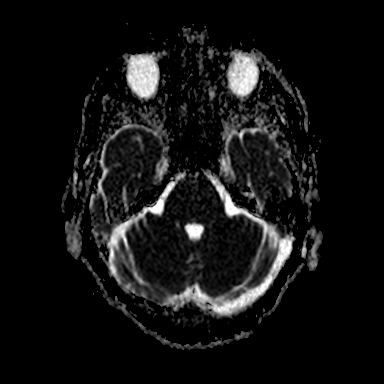
[im 36/54]
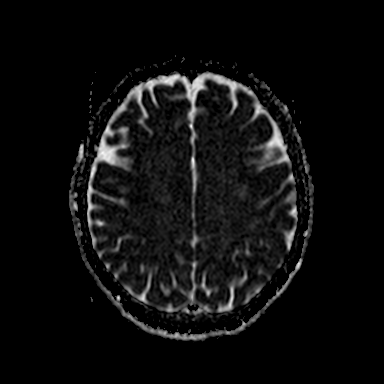
[im 54/54]
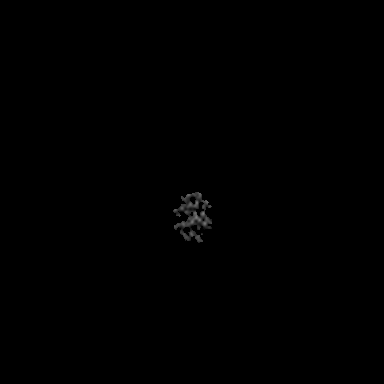

[Series 7: cor dwi_tracew · coronal · 5.0mm · 0.60mm/px · 3 of 41 slices shown]
[im 1/41]
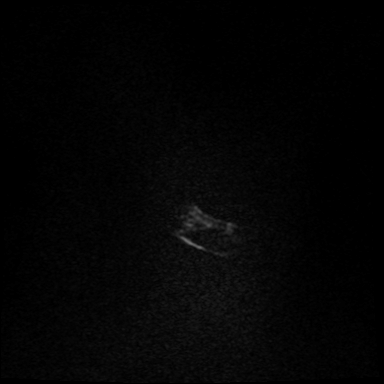
[im 21/41]
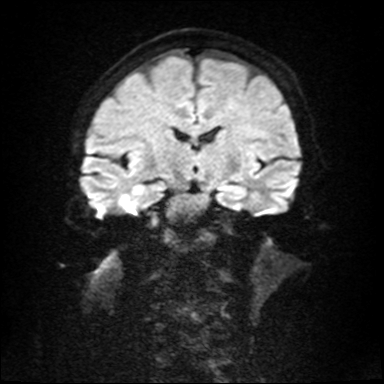
[im 41/41]
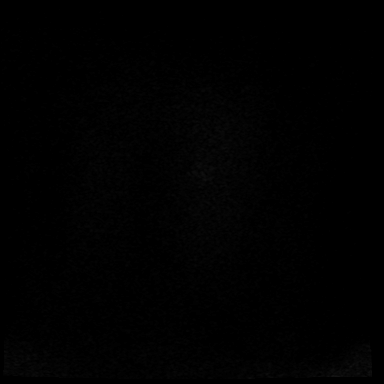

[Series 8: cor dwi_adc · coronal · 5.0mm · 0.60mm/px · 3 of 40 slices shown]
[im 1/40]
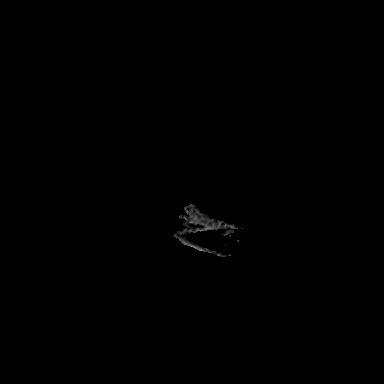
[im 20/40]
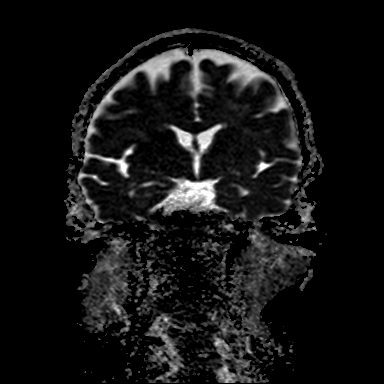
[im 40/40]
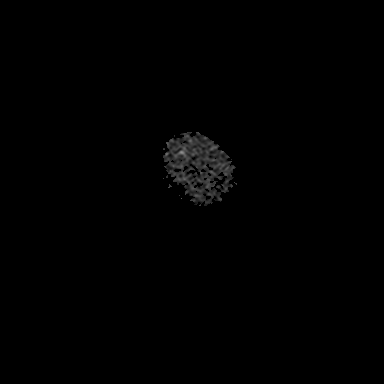

[Series 9: T1 · sagittal · 5.0mm · 0.62mm/px · 2 of 23 slices shown (1 of 2)]
[im 1/23]
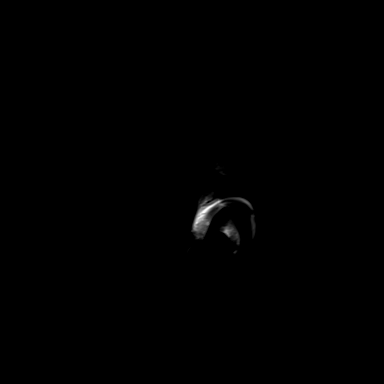
[im 23/23]
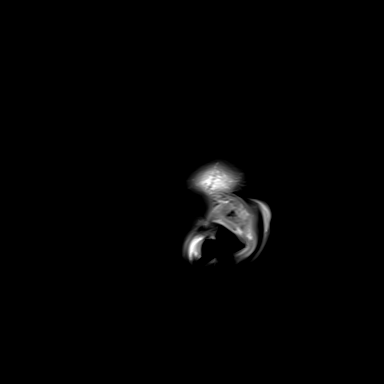

[Series 10: T2 · axial · 5.0mm · 0.53mm/px · z∈[-39,+105]mm · 2 of 25 slices shown (1 of 2)]
[im 1/25]
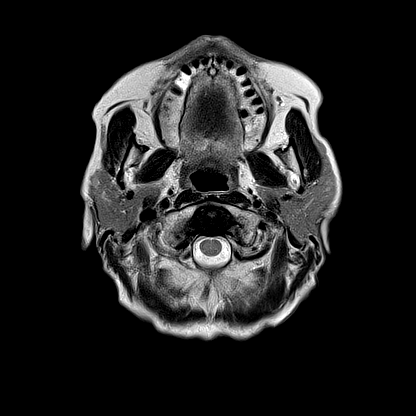
[im 25/25]
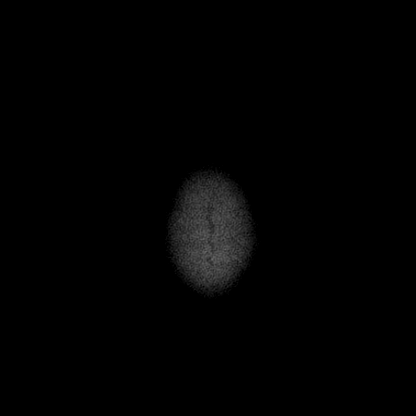

[Series 11: mag_images · axial · 3.0mm · 0.90mm/px · z∈[-56,+121]mm · 4 of 60 slices shown]
[im 1/60]
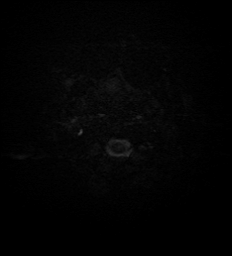
[im 20/60]
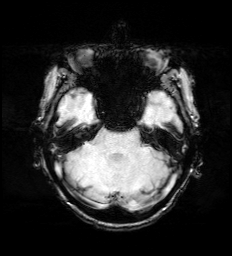
[im 40/60]
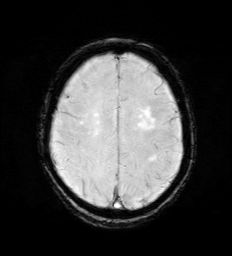
[im 60/60]
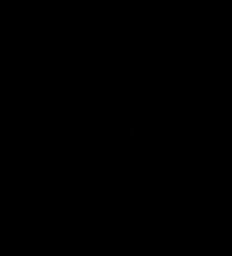

[Series 12: pha_images · axial · 3.0mm · 0.90mm/px · z∈[-56,+121]mm · 4 of 57 slices shown]
[im 1/57]
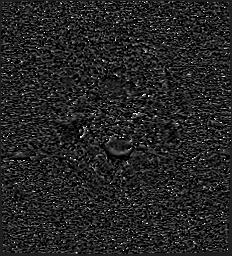
[im 19/57]
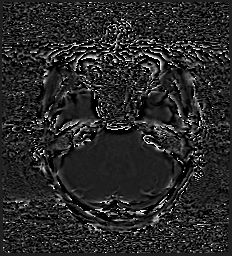
[im 38/57]
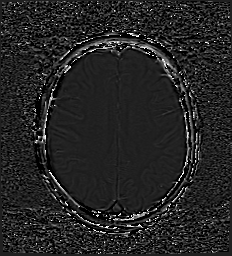
[im 57/57]
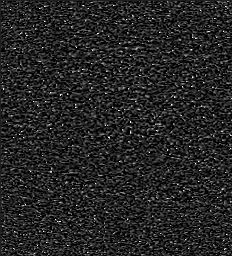

[Series 13: swi_images · axial · 3.0mm · 0.90mm/px · z∈[-56,+1]mm · 2 of 60 slices shown]
[im 1/60]
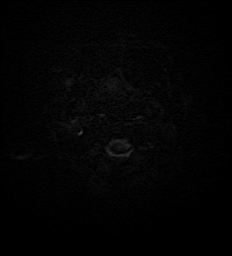
[im 20/60]
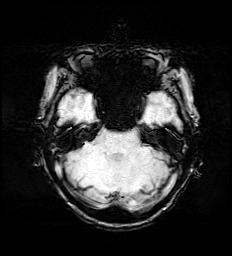

[Series 15: FLAIR · axial · 3.0mm · 0.53mm/px · z∈[-48,+114]mm · 4 of 55 slices shown]
[im 1/55]
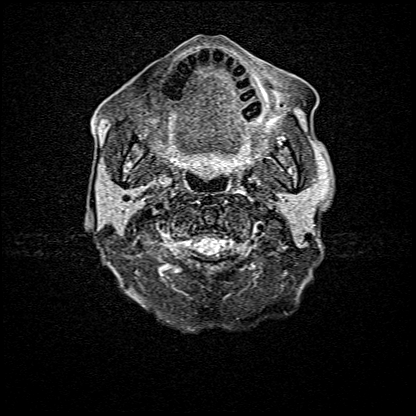
[im 19/55]
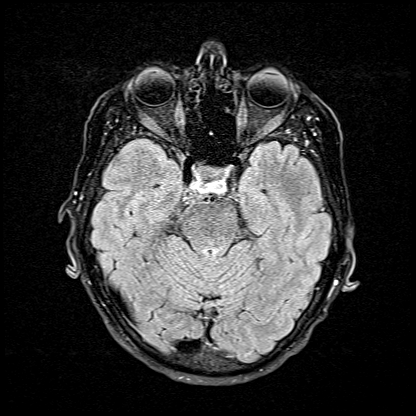
[im 37/55]
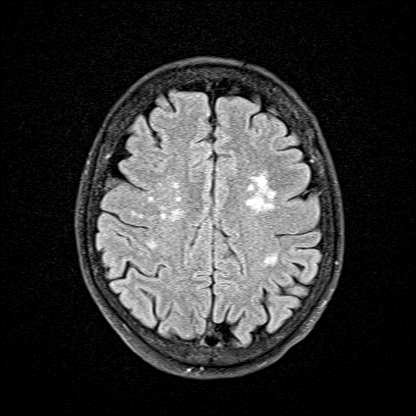
[im 55/55]
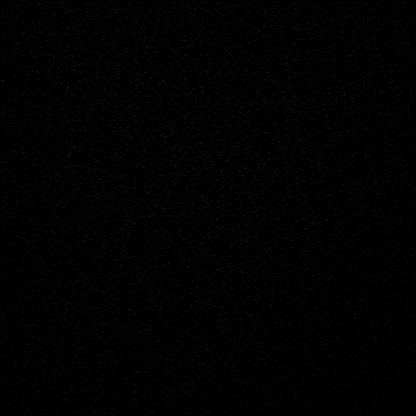

[Series 16: T1 · axial · 1.0mm · 0.98mm/px · z∈[-55,+120]mm · 8 of 172 slices shown (2 of 2)]
[im 1/172]
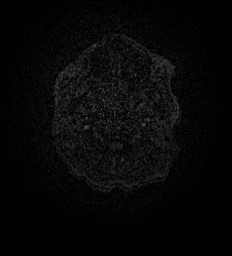
[im 32/172]
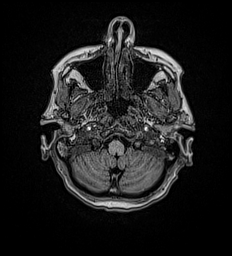
[im 47/172]
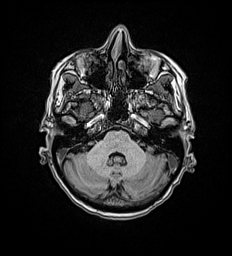
[im 78/172]
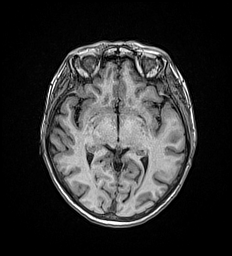
[im 94/172]
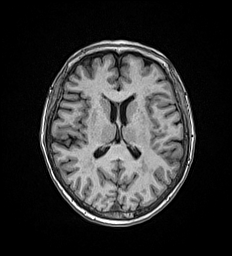
[im 125/172]
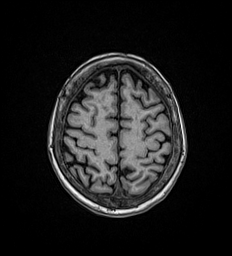
[im 140/172]
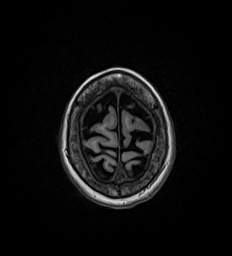
[im 172/172]
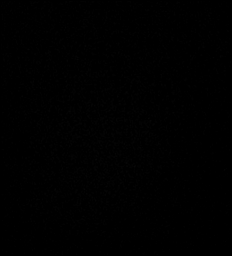

[Series 17: T2 · coronal · 5.0mm · 0.57mm/px · 2 of 29 slices shown (2 of 2)]
[im 1/29]
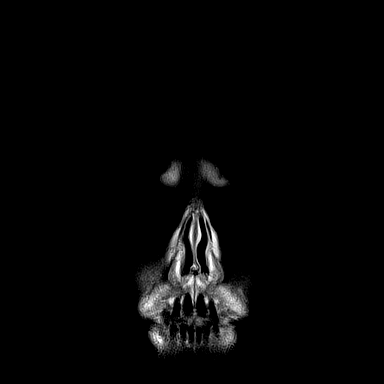
[im 29/29]
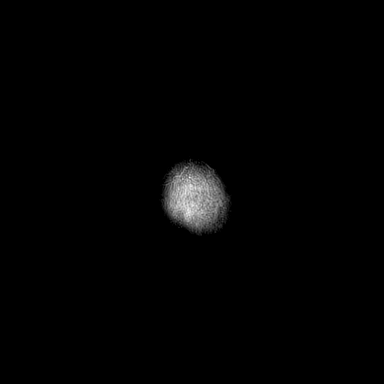

[42 of 48 positions shown; findings below may reference images not displayed]

FINDINGS: Brain: Stable cerebral volume. No restricted diffusion to suggest
acute infarction. No midline shift, mass effect, evidence of mass
lesion, ventriculomegaly, extra-axial collection or acute
intracranial hemorrhage. Cervicomedullary junction and pituitary are
within normal limits.

Patchy and scattered bilateral cerebral white matter T2 and FLAIR
hyperintensity is chronic but mildly progressed since 7142. The
configuration is nonspecific. No associated cortical
encephalomalacia or chronic cerebral blood products identified. The
deep gray matter nuclei, brainstem, and cerebellum remain within
normal limits.

Vascular: Major intracranial vascular flow voids are stable and
within normal limits.

Skull and upper cervical spine: Stable and negative visible cervical
spine. Normal bone marrow signal.

Sinuses/Orbits: Paranasal sinuses are stable and clear.

Stable and negative orbits soft tissues.

Other: Mastoids remain clear. Visible internal auditory structures
appear normal. Scalp and face soft tissues appear negative.
IMPRESSION: 1.  No acute intracranial abnormality.
2. Mild progression of nonspecific chronic cerebral white matter
signal changes since the 7142 MRI, most commonly due to chronic
small vessel disease.
3. Paranasal sinuses remain normal.

## 2020-10-27 DIAGNOSIS — S52502A Unspecified fracture of the lower end of left radius, initial encounter for closed fracture: Secondary | ICD-10-CM | POA: Diagnosis not present

## 2020-10-30 ENCOUNTER — Ambulatory Visit: Payer: Medicare Other | Admitting: Internal Medicine

## 2020-10-31 ENCOUNTER — Other Ambulatory Visit: Payer: Self-pay | Admitting: Internal Medicine

## 2020-10-31 DIAGNOSIS — J441 Chronic obstructive pulmonary disease with (acute) exacerbation: Secondary | ICD-10-CM | POA: Diagnosis not present

## 2020-11-24 DIAGNOSIS — S52502A Unspecified fracture of the lower end of left radius, initial encounter for closed fracture: Secondary | ICD-10-CM | POA: Diagnosis not present

## 2020-11-24 DIAGNOSIS — M8580 Other specified disorders of bone density and structure, unspecified site: Secondary | ICD-10-CM | POA: Diagnosis not present

## 2020-12-14 DIAGNOSIS — S52502A Unspecified fracture of the lower end of left radius, initial encounter for closed fracture: Secondary | ICD-10-CM | POA: Diagnosis not present

## 2020-12-28 DIAGNOSIS — M654 Radial styloid tenosynovitis [de Quervain]: Secondary | ICD-10-CM | POA: Diagnosis not present

## 2020-12-28 DIAGNOSIS — M25532 Pain in left wrist: Secondary | ICD-10-CM | POA: Diagnosis not present

## 2020-12-28 DIAGNOSIS — S52502A Unspecified fracture of the lower end of left radius, initial encounter for closed fracture: Secondary | ICD-10-CM | POA: Diagnosis not present

## 2021-01-01 ENCOUNTER — Other Ambulatory Visit: Payer: Self-pay | Admitting: Internal Medicine

## 2021-01-06 ENCOUNTER — Other Ambulatory Visit: Payer: Self-pay | Admitting: Internal Medicine

## 2021-01-08 ENCOUNTER — Telehealth: Payer: Self-pay | Admitting: Internal Medicine

## 2021-01-08 NOTE — Telephone Encounter (Signed)
RX Refill:klnopin Last Seen:07-31-20 Last ordered:9-20-214

## 2021-01-12 DIAGNOSIS — E559 Vitamin D deficiency, unspecified: Secondary | ICD-10-CM | POA: Diagnosis not present

## 2021-01-12 DIAGNOSIS — M810 Age-related osteoporosis without current pathological fracture: Secondary | ICD-10-CM | POA: Diagnosis not present

## 2021-01-12 DIAGNOSIS — R7989 Other specified abnormal findings of blood chemistry: Secondary | ICD-10-CM | POA: Diagnosis not present

## 2021-01-15 ENCOUNTER — Other Ambulatory Visit: Payer: Self-pay | Admitting: Orthopedic Surgery

## 2021-01-15 DIAGNOSIS — M25532 Pain in left wrist: Secondary | ICD-10-CM

## 2021-01-15 DIAGNOSIS — M654 Radial styloid tenosynovitis [de Quervain]: Secondary | ICD-10-CM

## 2021-01-29 ENCOUNTER — Ambulatory Visit (INDEPENDENT_AMBULATORY_CARE_PROVIDER_SITE_OTHER): Payer: Medicare HMO | Admitting: Internal Medicine

## 2021-01-29 ENCOUNTER — Other Ambulatory Visit: Payer: Self-pay

## 2021-01-29 ENCOUNTER — Encounter: Payer: Self-pay | Admitting: Internal Medicine

## 2021-01-29 VITALS — BP 112/68 | HR 91 | Temp 97.8°F | Resp 15 | Ht 60.0 in | Wt 108.0 lb

## 2021-01-29 DIAGNOSIS — I700 Atherosclerosis of aorta: Secondary | ICD-10-CM | POA: Diagnosis not present

## 2021-01-29 DIAGNOSIS — Z87891 Personal history of nicotine dependence: Secondary | ICD-10-CM

## 2021-01-29 DIAGNOSIS — J4489 Other specified chronic obstructive pulmonary disease: Secondary | ICD-10-CM

## 2021-01-29 DIAGNOSIS — E538 Deficiency of other specified B group vitamins: Secondary | ICD-10-CM | POA: Diagnosis not present

## 2021-01-29 DIAGNOSIS — J449 Chronic obstructive pulmonary disease, unspecified: Secondary | ICD-10-CM | POA: Diagnosis not present

## 2021-01-29 DIAGNOSIS — T380X5A Adverse effect of glucocorticoids and synthetic analogues, initial encounter: Secondary | ICD-10-CM | POA: Diagnosis not present

## 2021-01-29 DIAGNOSIS — K50912 Crohn's disease, unspecified, with intestinal obstruction: Secondary | ICD-10-CM | POA: Diagnosis not present

## 2021-01-29 DIAGNOSIS — R7401 Elevation of levels of liver transaminase levels: Secondary | ICD-10-CM | POA: Diagnosis not present

## 2021-01-29 DIAGNOSIS — M818 Other osteoporosis without current pathological fracture: Secondary | ICD-10-CM | POA: Diagnosis not present

## 2021-01-29 DIAGNOSIS — E559 Vitamin D deficiency, unspecified: Secondary | ICD-10-CM | POA: Diagnosis not present

## 2021-01-29 LAB — VITAMIN D 25 HYDROXY (VIT D DEFICIENCY, FRACTURES): VITD: 43.98 ng/mL (ref 30.00–100.00)

## 2021-01-29 LAB — CBC WITH DIFFERENTIAL/PLATELET
Basophils Absolute: 0.1 10*3/uL (ref 0.0–0.1)
Basophils Relative: 1 % (ref 0.0–3.0)
Eosinophils Absolute: 0.1 10*3/uL (ref 0.0–0.7)
Eosinophils Relative: 1 % (ref 0.0–5.0)
HCT: 39.6 % (ref 36.0–46.0)
Hemoglobin: 13.2 g/dL (ref 12.0–15.0)
Lymphocytes Relative: 45.4 % (ref 12.0–46.0)
Lymphs Abs: 3.8 10*3/uL (ref 0.7–4.0)
MCHC: 33.2 g/dL (ref 30.0–36.0)
MCV: 100.1 fl — ABNORMAL HIGH (ref 78.0–100.0)
Monocytes Absolute: 0.9 10*3/uL (ref 0.1–1.0)
Monocytes Relative: 11 % (ref 3.0–12.0)
Neutro Abs: 3.4 10*3/uL (ref 1.4–7.7)
Neutrophils Relative %: 41.6 % — ABNORMAL LOW (ref 43.0–77.0)
Platelets: 297 10*3/uL (ref 150.0–400.0)
RBC: 3.96 Mil/uL (ref 3.87–5.11)
RDW: 13.5 % (ref 11.5–15.5)
WBC: 8.3 10*3/uL (ref 4.0–10.5)

## 2021-01-29 LAB — VITAMIN B12: Vitamin B-12: 184 pg/mL — ABNORMAL LOW (ref 211–911)

## 2021-01-29 MED ORDER — "SYRINGE 25G X 1"" 3 ML MISC": 0 refills | Status: DC

## 2021-01-29 MED ORDER — CYANOCOBALAMIN 1000 MCG/ML IJ SOLN: INTRAMUSCULAR | 0 refills | Status: DC

## 2021-01-29 MED ORDER — CYANOCOBALAMIN 1000 MCG/ML IJ SOLN: 1000.0000 ug | INTRAMUSCULAR | 2 refills | Status: DC

## 2021-01-29 NOTE — Patient Instructions (Signed)
NEVER STOP B12 SUPPLEMENTATION. N Untreated B12 can lead to irreversible dementia and neuropathy so it is  very important to treat   If your level is low,  You will need to take [place].

## 2021-01-29 NOTE — Assessment & Plan Note (Signed)
Recurrent due to patient nonadherence to monthly injection schedule.  She has crohn's of the small intestine and will need to continue lifelong monthly injections  After resuming with  3 weekly doses needed to resolve deficiency   Lab Results  Component Value Date   VITAMINB12 184 (L) 01/29/2021

## 2021-01-29 NOTE — Progress Notes (Signed)
Subjective:  Patient ID: Tamara [name], female    DOB: 08/12/1946  Age: 75 y.o. MRN: [id number]  CC: The primary encounter diagnosis was Elevated ALT measurement. Diagnoses of B12 deficiency, Vitamin D deficiency, Crohn's disease of intestine, with intestinal obstruction ([place]), Aortic atherosclerosis ([place]), Emphysema with chronic bronchitis ([place]), Steroid-induced osteoporosis, and History of tobacco abuse were also pertinent to this visit.  HPI Tamara [name] presents for follow up on multiple conditions including a recently  elevated ALT noted in November with additional labs ordered but not yet done by patient  This visit occurred during the SARS-CoV-2 public health emergency.  Safety protocols were in place, including screening questions prior to the visit, additional usage of staff PPE, and extensive cleaning of exam room while observing appropriate contact time as indicated for disinfecting solutions.    B12 deficiency:  Has not been  b12 injections or oral supplemements for several years for no real reason.  (prescription ran out). Reviewed the complications of E08 deficiency again with patient.   Tobacco abuse  Quit in Sept /Oct 2021 with the use of  30 days of wellbutrin.  No longer taking it  Weight gain noted (much to her dismay).   Underweight:  Reviewed the complications of being underweight; many of which she has already developed:   Osteoporosis l managed by endocrinology/  Taking 2000 Ius of D3 daily.  Getting reclast and EXA via End[facility]ology at [facility] .  Fractured left wrist in December during a fall on way to bathroom in December,  Occurred during the  night. Wrist was casted but pain is persisent MRI wrist wendesday.   Crohn's Disease:  Last colonoscopy was 2021 b Dr Alice Reichert.  She has had no flares in the last several months    HM:  Refuses mammogram. Does her own breast exams regularly .   Outpatient Medications Prior to Visit  Medication Sig Dispense Refill  .  clonazePAM (KLONOPIN) 0.5 MG tablet TAKE 1 TABLET BY MOUTH ONCE DAILY AS NEEDED ANXIETY 30 tablet 0  . Fluticasone-Salmeterol (ADVAIR DISKUS) 250-50 MCG/DOSE AEPB Inhale 1 puff into the lungs 2 (two) times daily. 1 each 3  . HYDROcodone-acetaminophen (NORCO) 5-325 MG tablet Take 1-2 tablets by mouth every 4 (four) hours as needed for moderate pain or severe pain. 10 tablet 0  . HYDROcodone-acetaminophen (NORCO/VICODIN) 5-325 MG tablet Take 1 tablet by mouth 4 (four) times daily as needed.    . nortriptyline (PAMELOR) 25 MG capsule TAKE 1 CAPSULE BY MOUTH AT BEDTIME 90 capsule 1  . omeprazole (PRILOSEC) 40 MG capsule TAKE 1 CAPSULE BY MOUTH TWICE DAILY 180 capsule 1  . ondansetron (ZOFRAN ODT) 4 MG disintegrating tablet Take 1 tablet (4 mg total) by mouth every 8 (eight) hours as needed for nausea or vomiting. 20 tablet 0  . rosuvastatin (CRESTOR) 10 MG tablet TAKE 1 TABLET BY MOUTH AT BEDTIME 90 tablet 1  . sertraline (ZOLOFT) 100 MG tablet Take 0.5 tablets (50 mg total) by mouth daily. 90 tablet 1  . tiotropium (SPIRIVA HANDIHALER) 18 MCG inhalation capsule Place 1 capsule (18 mcg total) into inhaler and inhale daily. 30 capsule 12  . traZODone (DESYREL) 50 MG tablet TAKE 2 TABLETS BY MOUTH AT BEDTIME 180 tablet 1  . acetaminophen (TYLENOL) 500 MG tablet Take 2 tablets (1,000 mg total) by mouth every 8 (eight) hours. 90 tablet 2  . buPROPion (WELLBUTRIN XL) 150 MG 24 hr tablet Take 1 tablet (150 mg total) by mouth daily. [place]  tablet 5   No facility-administered medications prior to visit.    Review of Systems;  Patient denies headache, fevers, malaise, unintentional weight loss, skin rash, eye pain, sinus congestion and sinus pain, sore throat, dysphagia,  hemoptysis , cough, dyspnea, wheezing, chest pain, palpitations, orthopnea, edema, abdominal pain, nausea, melena, diarrhea, constipation, flank pain, dysuria, hematuria, urinary  Frequency, nocturia, numbness, tingling, seizures,  Focal  weakness, Loss of consciousness,  Tremor, insomnia, depression, anxiety, and suicidal ideation.      Objective:  BP 112/68 (BP Location: Left Arm, Patient Position: Sitting, Cuff Size: Normal)   Pulse 91   Temp 97.8 F (36.6 C) (Oral)   Resp 15   Ht 5' (1.524 m)   Wt 108 lb (49 kg)   SpO2 97%   BMI 21.09 kg/m   BP Readings from Last 3 Encounters:  01/29/21 112/68  09/18/20 120/80  07/31/20 (!) 122/56    Wt Readings from Last 3 Encounters:  01/29/21 108 lb (49 kg)  09/18/20 104 lb 6.4 oz (47.4 kg)  07/31/20 97 lb 6.4 oz (44.2 kg)    General appearance: alert, cooperative and appears stated age Ears: normal TM's and external ear canals both ears Throat: lips, mucosa, and tongue normal; teeth and gums normal Neck: no adenopathy, no carotid bruit, supple, symmetrical, trachea midline and thyroid not enlarged, symmetric, no tenderness/mass/nodules Back: symmetric, no curvature. ROM normal. No CVA tenderness. Lungs: clear to auscultation bilaterally Heart: regular rate and rhythm, S1, S2 normal, no murmur, click, rub or gallop Abdomen: soft, non-tender; bowel sounds normal; no masses,  no organomegaly Pulses: 2+ and symmetric Skin: Skin color, texture, turgor normal. No rashes or lesions Lymph nodes: Cervical, supraclavicular, and axillary nodes normal.  Lab Results  Component Value Date   HGBA1C 6.1 09/21/2020    Lab Results  Component Value Date   CREATININE 0.72 08/28/2020   CREATININE 0.73 06/12/2020   CREATININE 0.73 01/26/2020    Lab Results  Component Value Date   WBC 8.3 01/29/2021   HGB 13.2 01/29/2021   HCT 39.6 01/29/2021   PLT 297.0 01/29/2021   GLUCOSE 126 (H) 08/28/2020   CHOL 188 01/26/2020   TRIG 182.0 (H) 01/26/2020   HDL 58.90 01/26/2020   LDLDIRECT 121.1 01/26/2014   LDLCALC 92 01/26/2020   ALT 38 (H) 09/21/2020   AST 30 09/21/2020   NA 139 08/28/2020   K 4.2 08/28/2020   CL 104 08/28/2020   CREATININE 0.72 08/28/2020   BUN 12  08/28/2020   CO2 26 08/28/2020   TSH 1.74 06/11/2017   INR 0.85 12/18/2016   HGBA1C 6.1 09/21/2020    CT ENTERO ABD/PELVIS W CONTAST  Result Date: 09/12/2020 CLINICAL DATA:  Severe bloating after eating with constipation and diarrhea for 1 month. EXAM: CT ABDOMEN AND PELVIS WITH CONTRAST (ENTEROGRAPHY) TECHNIQUE: Multidetector CT of the abdomen and pelvis during bolus administration of intravenous contrast. Negative oral contrast was given. CONTRAST:  64m OMNIPAQUE IOHEXOL 300 MG/ML  SOLN COMPARISON:  CT of January 21, 2013 FINDINGS: Lower chest:  Lung bases are clear.  No effusion.  No consolidation. Hepatobiliary: Liver without focal lesion. Patent portal vein. Post cholecystectomy with mild dilation of the common bile duct. Biliary duct dilation is less than in 2012 perhaps slightly more when compared to the study of 2014, common bile duct measuring approximately 8-9 mm greatest caliber. Intrahepatic biliary tree in the LEFT lobe with minimal dilation grossly unchanged compared to previous imaging. Pancreas: No pancreatic ductal dilation or sign of  inflammation. Spleen: Spleen is surgically absent. Adrenals/Urinary Tract: Adrenal glands are normal. Symmetric renal enhancement. No sign of hydronephrosis. Urinary bladder under distended grossly unremarkable. Stomach/Bowel: Post partial gastrectomy and splenectomy. Mildly patulous distal esophagus, partially imaged. Distension of small bowel loops with ingested contrast is intended but with more profound dilation of a small bowel anastomosis in the upper abdomen. Dilated at the anastomotic level but without perienteric stranding. In bound in out bound bowel loops are normal in caliber. No perienteric stranding. No sign of abscess or fistula. Areas of alternating dilation and mild narrowing in the colon without dilation to suggest obstruction in the colon. Mural stratification of both rectum and descending colon best seen on image 127 of series 4 and tracking  throughout the rectum. Vascular/Lymphatic: Calcified and noncalcified atheromatous plaque in the abdominal aorta. Patent abdominal vasculature. There is no gastrohepatic or hepatoduodenal ligament lymphadenopathy. No retroperitoneal or mesenteric lymphadenopathy. No pelvic sidewall lymphadenopathy. Diminutive RIGHT iliac venous system may relate to prior DVT. Venous structures not well assessed. Reproductive: Post hysterectomy. Other: Postoperative changes in the RIGHT gluteal region tracking towards the perineum may relate to prior fistula repair showing no change from previous imaging and without surrounding stranding or abscess in this location. Musculoskeletal: No acute bone finding. No destructive bone process. Spinal degenerative changes and osteopenia. IMPRESSION: 1. Findings of potential proctocolitis in this patient with Crohn's disease with rectal and distal colonic mural stratification and mild mucosal hyperenhancement. 2. Postoperative changes in the RIGHT gluteal region likely related to prior perianal fistula repair, correlate clinically. 3. Persistent biliary duct distension, alternating in severity. Correlate with any laboratory evidence of biliary dysfunction. Current caliber is less than 1 cm, within the normal range post cholecystectomy. 4. No overt signs of enteritis. 5. Post partial gastrectomy and splenectomy. 6. No sign of bowel obstruction but with distended loop of bowel adjacent to anastomosis likely related to anastomotic changes with similar appearance to prior imaging studies. 7. RIGHT iliac venous system with diminutive appearance may relate to prior DVT. Venous structures not well assessed. 8. Aortic atherosclerosis. Aortic Atherosclerosis (ICD10-I70.0).  The the Electronically Signed   By: Zetta Bills M.D.   On: 09/12/2020 14:39    Assessment & Plan:   Problem List Items Addressed This Visit      Unprioritized   B12 deficiency    Recurrent due to patient nonadherence to  monthly injection schedule.  She has crohn's of the small intestine and will need to continue lifelong monthly injections  After resuming with  3 weekly doses needed to resolve deficiency   Lab Results  Component Value Date   VITAMINB12 184 (L) 01/29/2021         Relevant Orders   Vitamin B12 (Completed)   CBC with Differential/Platelet (Completed)   Intrinsic Factor Antibodies   Vitamin D deficiency    With osteoporosis and recent fall and fracture.  Continue current supplementation  Last vitamin D Lab Results  Component Value Date   VD25OH 43.98 01/29/2021        Relevant Orders   VITAMIN D 25 Hydroxy (Vit-D Deficiency, Fractures) (Completed)   Aortic atherosclerosis ([place])    Reviewed findings of prior CT scan today..  Patient is tolerating statin therapy with 10 mg crestor once daily  And LFTS are normal based on review of labs via Epic portal from [facility]        Emphysema with chronic bronchitis ([place])    She is asymptomatic currently and have been prescribed maintenance doses  of Spiriva and Symbicort      Crohn's disease of intestine, with intestinal obstruction [facility])    Her disease is managed by Dr Alice Reichert,  Last colonoscopy done in 2021. durrently asymptomatic       Steroid-induced osteoporosis    Her options are limited to Reclast given failure of Prolia due to cost and history of dysphagia. She has been referred to Endocrinology .  She has had a recent wrist fracture in Dec 2021.      History of tobacco abuse    She stopped smoking Oct 2021 using Wellbutrin        Other Visit Diagnoses    Elevated ALT measurement    -  Primary     A total of 40 minutes was spent with patient more than half of which was spent in counseling patient on the above mentioned issues , reviewing and explaining recent labs and imaging studies done, and coordination of care.  I have discontinued Kirstie Mirza. Schuld's acetaminophen and buPROPion. I have also changed her  cyanocobalamin and cyanocobalamin. Additionally, I am having her start on SYRINGE 3CC/25GX1". Lastly, I am having her maintain her omeprazole, nortriptyline, HYDROcodone-acetaminophen, ondansetron, HYDROcodone-acetaminophen, sertraline, Spiriva HandiHaler, Fluticasone-Salmeterol, rosuvastatin, traZODone, and clonazePAM.  Meds ordered this encounter  Medications  . cyanocobalamin (,VITAMIN B-12,) 1000 MCG/ML injection    Sig: INJECT 1 VIAL (24m) INTRAMUSCULARLY ONCE PER WEEK for 3 weeks .    Dispense:  3 mL    Refill:  0  . Syringe/Needle, Disp, (SYRINGE 3CC/25GX1") 25G X 1" 3 ML MISC    Sig: Use for b12 injections    Dispense:  50 each    Refill:  0  . cyanocobalamin (,VITAMIN B-12,) 1000 MCG/ML injection    Sig: Inject 1 mL (1,000 mcg total) into the muscle every 30 (thirty) days.    Dispense:  10 mL    Refill:  2    Medications Discontinued During This Encounter  Medication Reason  . acetaminophen (TYLENOL) 500 MG tablet   . buPROPion (WELLBUTRIN XL) 150 MG 24 hr tablet     Follow-up: Return in about 3 months (around 05/01/2021).   TCrecencio Mc MD

## 2021-01-30 ENCOUNTER — Encounter: Payer: Self-pay | Admitting: Internal Medicine

## 2021-01-30 ENCOUNTER — Telehealth: Payer: Self-pay

## 2021-01-30 ENCOUNTER — Other Ambulatory Visit: Payer: Medicare HMO

## 2021-01-30 DIAGNOSIS — E538 Deficiency of other specified B group vitamins: Secondary | ICD-10-CM | POA: Diagnosis not present

## 2021-01-30 DIAGNOSIS — K50018 Crohn's disease of small intestine with other complication: Secondary | ICD-10-CM | POA: Diagnosis not present

## 2021-01-30 DIAGNOSIS — R197 Diarrhea, unspecified: Secondary | ICD-10-CM | POA: Diagnosis not present

## 2021-01-30 DIAGNOSIS — R1031 Right lower quadrant pain: Secondary | ICD-10-CM | POA: Diagnosis not present

## 2021-01-30 DIAGNOSIS — K219 Gastro-esophageal reflux disease without esophagitis: Secondary | ICD-10-CM | POA: Diagnosis not present

## 2021-01-30 NOTE — Assessment & Plan Note (Signed)
Her options are limited to Reclast given failure of Prolia due to cost and history of dysphagia. She has been referred to Endocrinology .  She has had a recent wrist fracture in Dec 2021.

## 2021-01-30 NOTE — Assessment & Plan Note (Signed)
With osteoporosis and recent fall and fracture.  Continue current supplementation  Last vitamin D Lab Results  Component Value Date   VD25OH 43.98 01/29/2021

## 2021-01-30 NOTE — Assessment & Plan Note (Signed)
Her disease is managed by Dr Alice Reichert,  Last colonoscopy done in 2021. durrently asymptomatic

## 2021-01-30 NOTE — Assessment & Plan Note (Addendum)
Reviewed findings of prior CT scan today..  Patient is tolerating statin therapy with 10 mg crestor once daily  And LFTS are normal based on review of labs via Epic portal from [facility]

## 2021-01-30 NOTE — Assessment & Plan Note (Signed)
She is asymptomatic currently and have been prescribed maintenance doses of Spiriva and Symbicort

## 2021-01-30 NOTE — Assessment & Plan Note (Signed)
She stopped smoking Oct 2021 using Wellbutrin

## 2021-01-30 NOTE — Telephone Encounter (Signed)
LMTCB for labs. 

## 2021-01-31 ENCOUNTER — Other Ambulatory Visit: Payer: Self-pay

## 2021-01-31 ENCOUNTER — Ambulatory Visit
Admission: RE | Admit: 2021-01-31 | Discharge: 2021-01-31 | Disposition: A | Discharge status: Home / Self Care | Payer: Medicare HMO | Source: Ambulatory Visit | Attending: Orthopedic Surgery | Admitting: Orthopedic Surgery

## 2021-01-31 DIAGNOSIS — R1031 Right lower quadrant pain: Secondary | ICD-10-CM | POA: Diagnosis not present

## 2021-01-31 DIAGNOSIS — M654 Radial styloid tenosynovitis [de Quervain]: Secondary | ICD-10-CM | POA: Diagnosis not present

## 2021-01-31 DIAGNOSIS — S52502D Unspecified fracture of the lower end of left radius, subsequent encounter for closed fracture with routine healing: Secondary | ICD-10-CM | POA: Diagnosis not present

## 2021-01-31 DIAGNOSIS — M25532 Pain in left wrist: Secondary | ICD-10-CM | POA: Diagnosis not present

## 2021-01-31 DIAGNOSIS — R197 Diarrhea, unspecified: Secondary | ICD-10-CM | POA: Diagnosis not present

## 2021-01-31 MED ORDER — VITAMIN D3 50 MCG (2000 UT) PO CAPS: 2000.0000 [IU] | ORAL_CAPSULE | Freq: Every day | ORAL | 3 refills | Status: DC

## 2021-02-02 LAB — INTRINSIC FACTOR ANTIBODIES: Intrinsic Factor: NEGATIVE

## 2021-02-13 ENCOUNTER — Telehealth: Payer: Self-pay | Admitting: Internal Medicine

## 2021-02-13 NOTE — Telephone Encounter (Signed)
Pt called back and didn't want to schedule AWV

## 2021-02-13 NOTE — Telephone Encounter (Signed)
Left message for patient to call back and schedule Medicare Annual Wellness Visit (AWV) either virtually or in office. No detailed message left    Last AWV 04/15/16  please schedule at anytime

## 2021-02-16 DIAGNOSIS — M810 Age-related osteoporosis without current pathological fracture: Secondary | ICD-10-CM | POA: Diagnosis not present

## 2021-02-21 DIAGNOSIS — M810 Age-related osteoporosis without current pathological fracture: Secondary | ICD-10-CM | POA: Diagnosis not present

## 2021-03-03 LAB — HM DEXA SCAN

## 2021-03-09 ENCOUNTER — Other Ambulatory Visit: Payer: Self-pay | Admitting: Internal Medicine

## 2021-03-09 NOTE — Telephone Encounter (Signed)
RX Refill:klonopin Last Seen:01-29-21 Last ordered:01-08-21

## 2021-03-12 NOTE — Telephone Encounter (Deleted)
Last OV: 01/29/2021

## 2021-03-27 ENCOUNTER — Telehealth: Payer: Self-pay | Admitting: Internal Medicine

## 2021-03-27 NOTE — Telephone Encounter (Signed)
Left message for patient to call back and schedule Medicare Annual Wellness Visit (AWV) in office.   If not able to come in office, please offer to do virtually or by telephone.   Last AWV:04/15/2016   Please schedule at anytime with Nurse Health Advisor.

## 2021-04-01 ENCOUNTER — Other Ambulatory Visit: Payer: Self-pay | Admitting: Internal Medicine

## 2021-04-07 ENCOUNTER — Other Ambulatory Visit: Payer: Self-pay | Admitting: Internal Medicine

## 2021-04-28 ENCOUNTER — Other Ambulatory Visit: Payer: Self-pay | Admitting: Internal Medicine

## 2021-05-02 DIAGNOSIS — S83242A Other tear of medial meniscus, current injury, left knee, initial encounter: Secondary | ICD-10-CM | POA: Diagnosis not present

## 2021-05-03 ENCOUNTER — Other Ambulatory Visit: Payer: Self-pay

## 2021-05-03 ENCOUNTER — Ambulatory Visit (INDEPENDENT_AMBULATORY_CARE_PROVIDER_SITE_OTHER): Payer: Medicare HMO | Admitting: Internal Medicine

## 2021-05-03 ENCOUNTER — Encounter: Payer: Self-pay | Admitting: Internal Medicine

## 2021-05-03 VITALS — BP 96/52 | HR 106 | Temp 96.6°F | Resp 15 | Ht 60.0 in | Wt 96.0 lb

## 2021-05-03 DIAGNOSIS — F411 Generalized anxiety disorder: Secondary | ICD-10-CM | POA: Diagnosis not present

## 2021-05-03 DIAGNOSIS — Z0000 Encounter for general adult medical examination without abnormal findings: Secondary | ICD-10-CM | POA: Diagnosis not present

## 2021-05-03 DIAGNOSIS — F409 Phobic anxiety disorder, unspecified: Secondary | ICD-10-CM | POA: Diagnosis not present

## 2021-05-03 DIAGNOSIS — R636 Underweight: Secondary | ICD-10-CM | POA: Diagnosis not present

## 2021-05-03 DIAGNOSIS — E441 Mild protein-calorie malnutrition: Secondary | ICD-10-CM

## 2021-05-03 DIAGNOSIS — F5105 Insomnia due to other mental disorder: Secondary | ICD-10-CM

## 2021-05-03 MED ORDER — CLONAZEPAM 0.5 MG PO TABS: ORAL_TABLET | ORAL | 5 refills | Status: DC

## 2021-05-03 NOTE — Progress Notes (Signed)
Patient ID: Tamara [name], female    DOB: 07/03/1946  Age: 75 y.o. MRN: [id number]  The patient is here for annual preventive  examination and management of other chronic and acute problems.   The risk factors are reflected in the social history.  The roster of all physicians providing medical care to patient - is listed in the Snapshot section of the chart.  Activities of daily living:  The patient is 100% independent in all ADLs: dressing, toileting, feeding as well as independent mobility  Home safety : The patient has smoke detectors in the home. They wear seatbelts.  There are no firearms at home. There is no violence in the home.   There is no risks for hepatitis, STDs or HIV. There is no   history of blood transfusion. They have no travel history to infectious disease endemic areas of the world.  The patient has seen their dentist in the last six month. They have seen their eye doctor in the last year. They admit to slight hearing difficulty with regard to whispered voices and some television programs.  They have deferred audiologic testing in the last year.  They do not  have excessive sun exposure. Discussed the need for sun protection: hats, long sleeves and use of sunscreen if there is significant sun exposure.   Diet: the importance of a healthy diet is discussed. They do have a healthy diet.  The benefits of regular aerobic exercise were discussed. She walks 4 times per week ,  20 minutes.   Depression screen: there are no signs or vegative symptoms of depression- irritability, change in appetite, anhedonia, sadness/tearfullness.  Cognitive assessment: the patient manages all their financial and personal affairs and is actively engaged. They could relate day,date,year and events; recalled 2/3 objects at 3 minutes; performed clock-face test normally.  The following portions of the patient's history were reviewed and updated as appropriate: allergies, current medications, past  family history, past medical history,  past surgical history, past social history  and problem list.  Visual acuity was not assessed per patient preference since she has regular follow up with her ophthalmologist. Hearing and body mass index were assessed and reviewed.   During the course of the visit the patient was educated and counseled about appropriate screening and preventive services including : fall prevention , diabetes screening, nutrition counseling, colorectal cancer screening, and recommended immunizations.    CC: There were no encounter diagnoses.   1) anxiety:  son had an AMI at the age of 51;  she is freaked out by it bc he is very healthy,  non smoker,  works out. Also,  her  dog has had another litter of puppies.; and her grown children are in conflict with each other over his son's girlfriend .  Averaging 4 hours of sleep . Has reduced zoloft to 50 mg for unclear reasons  2) had knee injection left knee by DR Posey Pronto yesterday  for chronic left knee pain;  feels no relief yet.  Still very painful     3) left wrist fracture of distal radius and lunate in Jan . Has questions about procedure and persistent bump.  Cannot see notes from [facility]   4) Underweight:  refuses to allow herself to gain weight with medication, says appetite is fine and she eats a lot.   5) has resumed smoking after 2.5 months of abstinence.   .  1/2 pack daily triggere by son's MI  6) OSTEOPOROSOS: Her last DEXA  was done in April 4/22 AT [facility] .  GETTING RECLAST .  Takes D3 1000 Ius daily .  No calcium   History AmeLie has a past medical history of Bowel obstruction ([place]), Chronic abdominal pain, COPD (chronic obstructive pulmonary disease) ([place]), Crohn's disease ([place]), Depression, DVT (deep venous thrombosis) ([place]) (04/24/2018), Dysphagia (12/04/2017), Facial twitching (07/11/2018), GERD (gastroesophageal reflux disease), Headache, History of DVT (deep vein thrombosis) (01/2007), Intractable  chronic migraine without aura and without status migrainosus (05/20/2019), Leukocytosis, Leukocytosis, and Osteoporosis.   She has a past surgical history that includes Cholecystectomy; Cervical fusion (2005); Splenectomy; Rotator cuff repair; Appendectomy; gyn surgery; laparotomy (01/30/07); Cataract extraction; Nissen fundoplication; Abdominal hysterectomy; Tonsillectomy; Lumbar laminectomy/decompression microdiscectomy (N/A, 12/25/2016); Colonoscopy (2015); Esophagogastroduodenoscopy (egd) with propofol (N/A, 12/17/2017); Esophagogastroduodenoscopy (egd) with propofol (N/A, 01/08/2018); Esophageal manometry (N/A, 02/11/2018); Carpal tunnel release; Colonoscopy with propofol (N/A, 05/10/2019); and Knee arthroscopy with medial menisectomy (Left, 06/22/2020).   Her family history includes Coronary artery disease in her father; Diabetes in her mother; Heart attack in her sister; Heart disease in her mother; Hypertension in her mother.She reports that she quit smoking about 6 years ago. Her smoking use included cigarettes. She has a 50.00 pack-year smoking history. She has never used smokeless tobacco. She reports that she does not drink alcohol and does not use drugs.  Outpatient Medications Prior to Visit  Medication Sig Dispense Refill   Cholecalciferol (VITAMIN D3) 50 MCG (2000 UT) capsule Take 1 capsule (2,000 Units total) by mouth daily. 90 capsule 3   clonazePAM (KLONOPIN) 0.5 MG tablet TAKE 1 TABLET BY MOUTH ONCE DAILY AS NEEDED ANXIETY 30 tablet 2   cyanocobalamin (,VITAMIN B-12,) 1000 MCG/ML injection Inject 1 mL (1,000 mcg total) into the muscle every 30 (thirty) days. 10 mL 2   Fluticasone-Salmeterol (ADVAIR DISKUS) 250-50 MCG/DOSE AEPB Inhale 1 puff into the lungs 2 (two) times daily. 1 each 3   nortriptyline (PAMELOR) 25 MG capsule TAKE 1 CAPSULE BY MOUTH AT BEDTIME 90 capsule 1   omeprazole (PRILOSEC) 40 MG capsule TAKE 1 CAPSULE BY MOUTH TWICE DAILY 180 capsule 1   ondansetron (ZOFRAN ODT) 4 MG  disintegrating tablet Take 1 tablet (4 mg total) by mouth every 8 (eight) hours as needed for nausea or vomiting. 20 tablet 0   rosuvastatin (CRESTOR) 10 MG tablet TAKE 1 TABLET BY MOUTH AT BEDTIME 90 tablet 1   sertraline (ZOLOFT) 100 MG tablet TAKE 1 TABLET BY MOUTH ONCE DAILY 90 tablet 1   Syringe/Needle, Disp, (SYRINGE 3CC/25GX1") 25G X 1" 3 ML MISC Use for b12 injections 50 each 0   tiotropium (SPIRIVA HANDIHALER) 18 MCG inhalation capsule Place 1 capsule (18 mcg total) into inhaler and inhale daily. 30 capsule 12   traZODone (DESYREL) 50 MG tablet TAKE 2 TABLETS BY MOUTH AT BEDTIME 180 tablet 1   HYDROcodone-acetaminophen (NORCO) 5-325 MG tablet Take 1-2 tablets by mouth every 4 (four) hours as needed for moderate pain or severe pain. (Patient not taking: Reported on 05/03/2021) 10 tablet 0   HYDROcodone-acetaminophen (NORCO/VICODIN) 5-325 MG tablet Take 1 tablet by mouth 4 (four) times daily as needed. (Patient not taking: Reported on 05/03/2021)     cyanocobalamin (,VITAMIN B-12,) 1000 MCG/ML injection INJECT 1 VIAL (73m) INTRAMUSCULARLY ONCE PER WEEK for 3 weeks . (Patient not taking: Reported on 05/03/2021) 3 mL 0   No facility-administered medications prior to visit.    Review of Systems  Patient denies headache, fevers, malaise, unintentional weight loss, skin rash, eye pain, sinus congestion and  sinus pain, sore throat, dysphagia,  hemoptysis , cough, dyspnea, wheezing, chest pain, palpitations, orthopnea, edema, abdominal pain, nausea, melena, diarrhea, constipation, flank pain, dysuria, hematuria, urinary  Frequency, nocturia, numbness, tingling, seizures,  Focal weakness, Loss of consciousness,  Tremor, insomnia, depression, anxiety, and suicidal ideation.     Objective:  BP (!) 96/52 (BP Location: Left Arm, Patient Position: Sitting, Cuff Size: Normal)   Pulse (!) 106   Temp (!) 96.6 F (35.9 C) (Temporal)   Resp 15   Ht 5' (1.524 m)   Wt 96 lb (43.5 kg)   SpO2 97%   BMI  18.75 kg/m   Physical Exam  General appearance: alert, cooperative and appears stated age Head: Normocephalic, without obvious abnormality, atraumatic Eyes: conjunctivae/corneas clear. PERRL, EOM's intact. Fundi benign. Ears: normal TM's and external ear canals both ears Nose: Nares normal. Septum midline. Mucosa normal. No drainage or sinus tenderness. Throat: lips, mucosa, and tongue normal; teeth and gums normal Neck: no adenopathy, no carotid bruit, no JVD, supple, symmetrical, trachea midline and thyroid not enlarged, symmetric, no tenderness/mass/nodules Lungs: clear to auscultation bilaterally Breasts: normal appearance, no masses or tenderness Heart: regular rate and rhythm, S1, S2 normal, no murmur, click, rub or gallop Abdomen: soft, non-tender; bowel sounds normal; no masses,  no organomegaly Extremities: extremities normal, atraumatic, no cyanosis or edema Pulses: 2+ and symmetric Skin: Skin color, texture, turgor normal. No rashes or lesions Neurologic: Alert and oriented X 3, normal strength and tone. Normal symmetric reflexes. Normal coordination and gait.     Assessment & Plan:   Problem List Items Addressed This Visit   None   I am having Ariea R. Nasby maintain her omeprazole, HYDROcodone-acetaminophen, ondansetron, HYDROcodone-acetaminophen, Spiriva HandiHaler, Fluticasone-Salmeterol, SYRINGE 3CC/25GX1", cyanocobalamin, Vitamin D3, nortriptyline, clonazePAM, traZODone, sertraline, and rosuvastatin.  No orders of the defined types were placed in this encounter.   Medications Discontinued During This Encounter  Medication Reason   cyanocobalamin (,VITAMIN B-12,) 1000 MCG/ML injection Duplicate    Follow-up: No follow-ups on file.   Crecencio Mc, MD

## 2021-05-03 NOTE — Patient Instructions (Signed)
Because of your osteoporosis  you need 1800 mg calcium daily to support your bones.  I recommend getting half  of your calcium through  Diet  rather than supplements given the recent association of calcium supplements with increased coronary artery calcium scores (You need 1800 mg of calcium daily )   Cow's milkd, Unsweetened almond/coconut milk and soy milk are low carb, cholesterol free  ways to increase your dietary calcium and vitamin D.    Increase your zoloft back to 100 mg daily

## 2021-05-06 DIAGNOSIS — F411 Generalized anxiety disorder: Secondary | ICD-10-CM | POA: Insufficient documentation

## 2021-05-06 NOTE — Assessment & Plan Note (Signed)
Managed with stable doses of  clonazepam and trazodone.   No medication changes today

## 2021-05-06 NOTE — Assessment & Plan Note (Signed)
She declines mammograms due to pain of procedure.  She [place] up to date on other screenings. age appropriate education and counseling updated, referrals for preventative services and immunizations addressed, dietary and smoking counseling addressed, most recent labs reviewed.  I have personally reviewed and have noted:  1) the patient's medical and social history 2) The pt's use of alcohol, tobacco, and illicit drugs 3) The patient's current medications and supplements 4) Functional ability including ADL's, fall risk, home safety risk, hearing and visual impairment 5) Diet and physical activities 6) Evidence for depression or mood disorder 7) The patient's height, weight, and BMI have been recorded in the chart  I have made referrals, and provided counseling and education based on review of the above

## 2021-05-06 NOTE — Assessment & Plan Note (Signed)
She refuses to regard er low body weight as pathologic and does not want to gai weight.  She is not bulemic or anorexic.

## 2021-05-06 NOTE — Assessment & Plan Note (Signed)
I have reviewed her diet and recommended that she increase her protein and fat intake while monitoring her carbohydrates.

## 2021-05-06 NOTE — Assessment & Plan Note (Signed)
secondary to son's recent AMI. She has been taking sertraline for years,   50 mg dose along with clonazepam for insomnia.  Advised to increase zoloft to 100 mg daily

## 2021-05-07 ENCOUNTER — Other Ambulatory Visit: Payer: Self-pay | Admitting: Orthopedic Surgery

## 2021-05-07 DIAGNOSIS — M1712 Unilateral primary osteoarthritis, left knee: Secondary | ICD-10-CM

## 2021-05-08 ENCOUNTER — Other Ambulatory Visit: Payer: Self-pay | Admitting: Internal Medicine

## 2021-05-11 DIAGNOSIS — I82402 Acute embolism and thrombosis of unspecified deep veins of left lower extremity: Secondary | ICD-10-CM

## 2021-05-11 HISTORY — DX: Acute embolism and thrombosis of unspecified deep veins of left lower extremity: I82.402

## 2021-05-12 ENCOUNTER — Ambulatory Visit
Admission: RE | Admit: 2021-05-12 | Discharge: 2021-05-12 | Disposition: A | Discharge status: Home / Self Care | Payer: Medicare Other | Source: Ambulatory Visit | Attending: Orthopedic Surgery | Admitting: Orthopedic Surgery

## 2021-05-12 ENCOUNTER — Other Ambulatory Visit: Payer: Self-pay

## 2021-05-12 DIAGNOSIS — M1712 Unilateral primary osteoarthritis, left knee: Secondary | ICD-10-CM | POA: Insufficient documentation

## 2021-05-17 ENCOUNTER — Other Ambulatory Visit: Payer: Self-pay | Admitting: Orthopedic Surgery

## 2021-05-17 DIAGNOSIS — M65862 Other synovitis and tenosynovitis, left lower leg: Secondary | ICD-10-CM | POA: Diagnosis not present

## 2021-05-23 ENCOUNTER — Other Ambulatory Visit: Payer: Self-pay

## 2021-05-23 ENCOUNTER — Encounter
Admission: RE | Admit: 2021-05-23 | Discharge: 2021-05-23 | Disposition: A | Discharge status: Home / Self Care | Payer: Medicare HMO | Source: Ambulatory Visit | Attending: Orthopedic Surgery | Admitting: Orthopedic Surgery

## 2021-05-23 HISTORY — DX: Personal history of urinary calculi: Z87.442

## 2021-05-23 HISTORY — DX: Unspecified osteoarthritis, unspecified site: M19.90

## 2021-05-23 NOTE — Patient Instructions (Signed)
Your procedure is scheduled on:05-25-21 Friday Report to the Registration Desk on the 1st floor of the [facility].Then proceed to the 2nd floor Surgery Desk in the [facility] To find out your arrival time, please call [phone] between 1PM - 3PM on:05-24-21 Thursday  REMEMBER: Instructions that are not followed completely may result in serious medical risk, up to and including death; or upon the discretion of your surgeon and anesthesiologist your surgery may need to be rescheduled.  Do not eat food after midnight the night before surgery.  No gum chewing, lozengers or hard candies.  You may however, drink CLEAR liquids up to 2 hours before you are scheduled to arrive for your surgery. Do not drink anything within 2 hours of your scheduled arrival time.  Clear liquids include: - water  - apple juice without pulp - gatorade  - black coffee or tea (Do NOT add milk or creamers to the coffee or tea) Do NOT drink anything that is not on this list.  In addition, your doctor has ordered for you to drink the provided  Ensure Pre-Surgery Clear Carbohydrate Drink  Drinking this carbohydrate drink up to two hours before surgery helps to reduce insulin resistance and improve patient outcomes. Please complete drinking 2 hours prior to scheduled arrival time.  TAKE THESE MEDICATIONS THE MORNING OF SURGERY WITH A SIP OF WATER: -Prilosec (Omeprazole)-take one the night before and one on the morning of surgery - helps to prevent nausea after surgery.)  One week prior to surgery: Stop Anti-inflammatories (NSAIDS) such as Advil, Aleve, Ibuprofen, Motrin, Naproxen, Naprosyn and Aspirin based products such as Excedrin, Goodys Powder, BC Powder.You may however, continue to take Tylenol if needed for pain up until the day of surgery.  Stop ANY OVER THE COUNTER supplements/vitamins NOW (05-23-21) until after surgery.  No Alcohol for 24 hours before or after surgery.  No Smoking including e-cigarettes  for 24 hours prior to surgery.  No chewable tobacco products for at least 6 hours prior to surgery.  No nicotine patches on the day of surgery.  Do not use any "recreational" drugs for at least a week prior to your surgery.  Please be advised that the combination of cocaine and anesthesia may have negative outcomes, up to and including death. If you test positive for cocaine, your surgery will be cancelled.  On the morning of surgery brush your teeth with toothpaste and water, you may rinse your mouth with mouthwash if you wish. Do not swallow any toothpaste or mouthwash.  Do not wear jewelry, make-up, hairpins, clips or nail polish.  Do not wear lotions, powders, or perfumes.   Do not shave body from the neck down 48 hours prior to surgery just in case you cut yourself which could leave a site for infection.  Also, freshly shaved skin may become irritated if using the CHG soap.  Contact lenses, hearing aids and dentures may not be worn into surgery.  Do not bring valuables to the hospital. [facility] is not responsible for any missing/lost belongings or valuables.   Use CHG Soap as directed on instruction sheet.  Notify your doctor if there is any change in your medical condition (cold, fever, infection).  Wear comfortable clothing (specific to your surgery type) to the hospital.  After surgery, you can help prevent lung complications by doing breathing exercises.  Take deep breaths and cough every 1-2 hours. Your doctor may order a device called an Incentive Spirometer to help you take deep breaths.  When coughing or sneezing, hold a pillow firmly against your incision with both hands. This is called "splinting." Doing this helps protect your incision. It also decreases belly discomfort.  If you are being admitted to the hospital overnight, leave your suitcase in the car. After surgery it may be brought to your room.  If you are being discharged the day of surgery, you will not  be allowed to drive home. You will need a responsible adult (18 years or older) to drive you home and stay with you that night.   If you are taking public transportation, you will need to have a responsible adult (18 years or older) with you. Please confirm with your physician that it is acceptable to use public transportation.   Please call the [facility] at [phone] if you have any questions about these instructions.  Surgery Visitation Policy:  Patients undergoing a surgery or procedure may have one family member or support person with them as long as that person is not COVID-19 positive or experiencing its symptoms.  That person may remain in the waiting area during the procedure.  Inpatient Visitation:    Visiting hours are 7 a.m. to 8 p.m. Inpatients will be allowed two visitors daily. The visitors may change each day during the patient's stay. No visitors under the age of 31. Any visitor under the age of 14 must be accompanied by an adult. The visitor must pass COVID-19 screenings, use hand sanitizer when entering and exiting the patient's room and wear a mask at all times, including in the patient's room. Patients must also wear a mask when staff or their visitor are in the room. Masking is required regardless of vaccination status.

## 2021-05-24 ENCOUNTER — Encounter
Admission: RE | Admit: 2021-05-24 | Discharge: 2021-05-24 | Disposition: A | Discharge status: Home / Self Care | Payer: Medicare Other | Source: Ambulatory Visit | Attending: Orthopedic Surgery | Admitting: Orthopedic Surgery

## 2021-05-24 DIAGNOSIS — Z01818 Encounter for other preprocedural examination: Secondary | ICD-10-CM | POA: Diagnosis not present

## 2021-05-24 DIAGNOSIS — E538 Deficiency of other specified B group vitamins: Secondary | ICD-10-CM | POA: Diagnosis not present

## 2021-05-24 DIAGNOSIS — K50912 Crohn's disease, unspecified, with intestinal obstruction: Secondary | ICD-10-CM | POA: Diagnosis not present

## 2021-05-24 DIAGNOSIS — K219 Gastro-esophageal reflux disease without esophagitis: Secondary | ICD-10-CM | POA: Diagnosis not present

## 2021-05-24 DIAGNOSIS — J449 Chronic obstructive pulmonary disease, unspecified: Secondary | ICD-10-CM | POA: Diagnosis not present

## 2021-05-24 DIAGNOSIS — A498 Other bacterial infections of unspecified site: Secondary | ICD-10-CM

## 2021-05-24 DIAGNOSIS — K2100 Gastro-esophageal reflux disease with esophagitis, without bleeding: Secondary | ICD-10-CM | POA: Diagnosis not present

## 2021-05-24 DIAGNOSIS — R1013 Epigastric pain: Secondary | ICD-10-CM | POA: Diagnosis not present

## 2021-05-24 DIAGNOSIS — Z8619 Personal history of other infectious and parasitic diseases: Secondary | ICD-10-CM | POA: Diagnosis not present

## 2021-05-24 DIAGNOSIS — K5090 Crohn's disease, unspecified, without complications: Secondary | ICD-10-CM | POA: Diagnosis not present

## 2021-05-24 DIAGNOSIS — R197 Diarrhea, unspecified: Secondary | ICD-10-CM | POA: Diagnosis not present

## 2021-05-24 HISTORY — DX: Other bacterial infections of unspecified site: A49.8

## 2021-05-24 LAB — CBC
HCT: 36.7 % (ref 36.0–46.0)
Hemoglobin: 12.5 g/dL (ref 12.0–15.0)
MCH: 35.3 pg — ABNORMAL HIGH (ref 26.0–34.0)
MCHC: 34.1 g/dL (ref 30.0–36.0)
MCV: 103.7 fL — ABNORMAL HIGH (ref 80.0–100.0)
Platelets: 232 10*3/uL (ref 150–400)
RBC: 3.54 MIL/uL — ABNORMAL LOW (ref 3.87–5.11)
RDW: 14.4 % (ref 11.5–15.5)
WBC: 9.1 10*3/uL (ref 4.0–10.5)
nRBC: 0 % (ref 0.0–0.2)

## 2021-05-25 ENCOUNTER — Ambulatory Visit: Payer: Medicare Other | Admitting: Anesthesiology

## 2021-05-25 ENCOUNTER — Other Ambulatory Visit: Payer: Self-pay

## 2021-05-25 ENCOUNTER — Ambulatory Visit
Admission: RE | Admit: 2021-05-25 | Discharge: 2021-05-25 | Disposition: A | Discharge status: Home / Self Care | Payer: Medicare Other | Attending: Orthopedic Surgery | Admitting: Orthopedic Surgery

## 2021-05-25 ENCOUNTER — Encounter: Payer: Self-pay | Admitting: Orthopedic Surgery

## 2021-05-25 ENCOUNTER — Encounter
Admission: RE | Disposition: A | Discharge status: Home / Self Care | Payer: Self-pay | Source: Home / Self Care | Attending: Orthopedic Surgery

## 2021-05-25 DIAGNOSIS — Z882 Allergy status to sulfonamides status: Secondary | ICD-10-CM | POA: Diagnosis not present

## 2021-05-25 DIAGNOSIS — Z8262 Family history of osteoporosis: Secondary | ICD-10-CM | POA: Diagnosis not present

## 2021-05-25 DIAGNOSIS — Z8261 Family history of arthritis: Secondary | ICD-10-CM | POA: Diagnosis not present

## 2021-05-25 DIAGNOSIS — Z8249 Family history of ischemic heart disease and other diseases of the circulatory system: Secondary | ICD-10-CM | POA: Diagnosis not present

## 2021-05-25 DIAGNOSIS — Z86718 Personal history of other venous thrombosis and embolism: Secondary | ICD-10-CM | POA: Diagnosis not present

## 2021-05-25 DIAGNOSIS — J449 Chronic obstructive pulmonary disease, unspecified: Secondary | ICD-10-CM | POA: Insufficient documentation

## 2021-05-25 DIAGNOSIS — M659 Synovitis and tenosynovitis, unspecified: Secondary | ICD-10-CM | POA: Diagnosis not present

## 2021-05-25 DIAGNOSIS — M11262 Other chondrocalcinosis, left knee: Secondary | ICD-10-CM | POA: Diagnosis not present

## 2021-05-25 DIAGNOSIS — F17200 Nicotine dependence, unspecified, uncomplicated: Secondary | ICD-10-CM | POA: Insufficient documentation

## 2021-05-25 DIAGNOSIS — Z79899 Other long term (current) drug therapy: Secondary | ICD-10-CM | POA: Insufficient documentation

## 2021-05-25 DIAGNOSIS — M94262 Chondromalacia, left knee: Secondary | ICD-10-CM | POA: Diagnosis not present

## 2021-05-25 DIAGNOSIS — Z833 Family history of diabetes mellitus: Secondary | ICD-10-CM | POA: Insufficient documentation

## 2021-05-25 DIAGNOSIS — M1712 Unilateral primary osteoarthritis, left knee: Secondary | ICD-10-CM | POA: Insufficient documentation

## 2021-05-25 DIAGNOSIS — M65862 Other synovitis and tenosynovitis, left lower leg: Secondary | ICD-10-CM | POA: Diagnosis not present

## 2021-05-25 DIAGNOSIS — M2242 Chondromalacia patellae, left knee: Secondary | ICD-10-CM | POA: Diagnosis not present

## 2021-05-25 HISTORY — PX: KNEE ARTHROSCOPY WITH MEDIAL MENISECTOMY: SHX5651

## 2021-05-25 SURGERY — ARTHROSCOPY, KNEE, WITH MEDIAL MENISCECTOMY
Anesthesia: General | Site: Knee | Laterality: Left

## 2021-05-25 MED ORDER — MEPERIDINE HCL 25 MG/ML IJ SOLN: 6.2500 mg | INTRAMUSCULAR | Status: DC | PRN

## 2021-05-25 MED ORDER — MIDAZOLAM HCL 2 MG/2ML IJ SOLN
INTRAMUSCULAR | Status: DC | PRN
  Administered 2021-05-25: 2 mg via INTRAVENOUS

## 2021-05-25 MED ORDER — PROPOFOL 10 MG/ML IV BOLUS
INTRAVENOUS | Status: AC
  Filled 2021-05-25: qty 20

## 2021-05-25 MED ORDER — LIDOCAINE HCL (CARDIAC) PF 100 MG/5ML IV SOSY
PREFILLED_SYRINGE | INTRAVENOUS | Status: DC | PRN
  Administered 2021-05-25: 60 mg via INTRAVENOUS

## 2021-05-25 MED ORDER — LACTATED RINGERS IV SOLN: INTRAVENOUS | Status: DC | PRN

## 2021-05-25 MED ORDER — LACTATED RINGERS IV SOLN
INTRAVENOUS | Status: DC | PRN
  Administered 2021-05-25: 3000 mL

## 2021-05-25 MED ORDER — ACETAMINOPHEN 10 MG/ML IV SOLN
INTRAVENOUS | Status: AC
  Filled 2021-05-25: qty 100

## 2021-05-25 MED ORDER — EPINEPHRINE PF 1 MG/ML IJ SOLN
INTRAMUSCULAR | Status: AC
  Filled 2021-05-25: qty 1

## 2021-05-25 MED ORDER — FENTANYL CITRATE (PF) 100 MCG/2ML IJ SOLN
INTRAMUSCULAR | Status: AC
  Filled 2021-05-25: qty 2

## 2021-05-25 MED ORDER — BUPIVACAINE HCL (PF) 0.5 % IJ SOLN
INTRAMUSCULAR | Status: DC | PRN
  Administered 2021-05-25: 7.5 mL

## 2021-05-25 MED ORDER — FENTANYL CITRATE (PF) 100 MCG/2ML IJ SOLN
INTRAMUSCULAR | Status: DC | PRN
  Administered 2021-05-25 (×4): 25 ug via INTRAVENOUS

## 2021-05-25 MED ORDER — LACTATED RINGERS IR SOLN
Status: DC | PRN
  Administered 2021-05-25: 3000 mL

## 2021-05-25 MED ORDER — CHLORHEXIDINE GLUCONATE 0.12 % MT SOLN
OROMUCOSAL | Status: AC
  Administered 2021-05-25: 15 mL via OROMUCOSAL
  Filled 2021-05-25: qty 15

## 2021-05-25 MED ORDER — ONDANSETRON 4 MG PO TBDP: 4.0000 mg | ORAL_TABLET | Freq: Three times a day (TID) | ORAL | 0 refills | Status: DC | PRN

## 2021-05-25 MED ORDER — MIDAZOLAM HCL 2 MG/2ML IJ SOLN
INTRAMUSCULAR | Status: AC
  Filled 2021-05-25: qty 2

## 2021-05-25 MED ORDER — LIDOCAINE-EPINEPHRINE 1 %-1:100000 IJ SOLN
INTRAMUSCULAR | Status: DC | PRN
  Administered 2021-05-25: 7.5 mL

## 2021-05-25 MED ORDER — ORAL CARE MOUTH RINSE: 15.0000 mL | Freq: Once | OROMUCOSAL | Status: AC

## 2021-05-25 MED ORDER — CEFAZOLIN SODIUM-DEXTROSE 2-4 GM/100ML-% IV SOLN
2.0000 g | INTRAVENOUS | Status: AC
  Administered 2021-05-25: 2 g via INTRAVENOUS

## 2021-05-25 MED ORDER — PROPOFOL 10 MG/ML IV BOLUS
INTRAVENOUS | Status: DC | PRN
  Administered 2021-05-25: 100 mg via INTRAVENOUS

## 2021-05-25 MED ORDER — FENTANYL CITRATE (PF) 100 MCG/2ML IJ SOLN
25.0000 ug | INTRAMUSCULAR | Status: DC | PRN
  Administered 2021-05-25: 25 ug via INTRAVENOUS

## 2021-05-25 MED ORDER — CEFAZOLIN SODIUM-DEXTROSE 2-4 GM/100ML-% IV SOLN
INTRAVENOUS | Status: AC
  Filled 2021-05-25: qty 100

## 2021-05-25 MED ORDER — LACTATED RINGERS IV SOLN: INTRAVENOUS | Status: DC

## 2021-05-25 MED ORDER — ASPIRIN EC 325 MG PO TBEC: 325.0000 mg | DELAYED_RELEASE_TABLET | Freq: Every day | ORAL | 0 refills | Status: DC

## 2021-05-25 MED ORDER — LIDOCAINE-EPINEPHRINE 1 %-1:100000 IJ SOLN
INTRAMUSCULAR | Status: AC
  Filled 2021-05-25: qty 1

## 2021-05-25 MED ORDER — OXYCODONE HCL 5 MG/5ML PO SOLN: 5.0000 mg | Freq: Once | ORAL | Status: DC | PRN

## 2021-05-25 MED ORDER — PROMETHAZINE HCL 25 MG/ML IJ SOLN
INTRAMUSCULAR | Status: AC
  Filled 2021-05-25: qty 1

## 2021-05-25 MED ORDER — BUPIVACAINE HCL (PF) 0.5 % IJ SOLN
INTRAMUSCULAR | Status: AC
  Filled 2021-05-25: qty 30

## 2021-05-25 MED ORDER — PROMETHAZINE HCL 25 MG/ML IJ SOLN
6.2500 mg | INTRAMUSCULAR | Status: DC | PRN
  Administered 2021-05-25: 6.25 mg via INTRAVENOUS

## 2021-05-25 MED ORDER — ACETAMINOPHEN 500 MG PO TABS: 1000.0000 mg | ORAL_TABLET | Freq: Three times a day (TID) | ORAL | 2 refills | Status: DC

## 2021-05-25 MED ORDER — ONDANSETRON HCL 4 MG/2ML IJ SOLN
INTRAMUSCULAR | Status: DC | PRN
  Administered 2021-05-25: 4 mg via INTRAVENOUS

## 2021-05-25 MED ORDER — HYDROCODONE-ACETAMINOPHEN 5-325 MG PO TABS: 1.0000 | ORAL_TABLET | ORAL | 0 refills | Status: DC | PRN

## 2021-05-25 MED ORDER — OXYCODONE HCL 5 MG PO TABS: 5.0000 mg | ORAL_TABLET | Freq: Once | ORAL | Status: DC | PRN

## 2021-05-25 MED ORDER — DEXAMETHASONE SODIUM PHOSPHATE 10 MG/ML IJ SOLN
INTRAMUSCULAR | Status: DC | PRN
  Administered 2021-05-25: 10 mg via INTRAVENOUS

## 2021-05-25 MED ORDER — IBUPROFEN 800 MG PO TABS: 800.0000 mg | ORAL_TABLET | Freq: Three times a day (TID) | ORAL | 1 refills | Status: AC

## 2021-05-25 MED ORDER — PHENYLEPHRINE HCL (PRESSORS) 10 MG/ML IV SOLN
INTRAVENOUS | Status: DC | PRN
  Administered 2021-05-25 (×3): 100 ug via INTRAVENOUS

## 2021-05-25 MED ORDER — CHLORHEXIDINE GLUCONATE 0.12 % MT SOLN: 15.0000 mL | Freq: Once | OROMUCOSAL | Status: AC

## 2021-05-25 SURGICAL SUPPLY — 50 items
ADAPTER IRRIG TUBE 2 SPIKE SOL (ADAPTER) ×3 IMPLANT
ADPR TBG 2 SPK PMP STRL ASCP (ADAPTER) ×1
APL PRP STRL LF DISP 70% ISPRP (MISCELLANEOUS) ×1
BLADE FULL RADIUS 3.5 (BLADE) IMPLANT
BLADE SHAVER 4.5X7 STR FR (MISCELLANEOUS) ×1 IMPLANT
BLADE SURG SZ11 CARB STEEL (BLADE) ×2 IMPLANT
BNDG COHESIVE 6X5 TAN STRL LF (GAUZE/BANDAGES/DRESSINGS) ×2 IMPLANT
BNDG ELASTIC 6X5.8 VLCR STR LF (GAUZE/BANDAGES/DRESSINGS) ×2 IMPLANT
BNDG ESMARK 6X12 TAN STRL LF (GAUZE/BANDAGES/DRESSINGS) ×2 IMPLANT
BUR BR 5.5 WIDE MOUTH (BURR) IMPLANT
CAST PADDING 6X4YD ST 30248 (SOFTGOODS) ×1
CHLORAPREP W/TINT 26 (MISCELLANEOUS) ×2 IMPLANT
COOLER POLAR GLACIER W/PUMP (MISCELLANEOUS) ×2 IMPLANT
CUFF TOURN SGL QUICK 24 (TOURNIQUET CUFF) ×2
CUFF TOURN SGL QUICK 34 (TOURNIQUET CUFF)
CUFF TRNQT CYL 24X4X16.5-23 (TOURNIQUET CUFF) IMPLANT
CUFF TRNQT CYL 34X4.125X (TOURNIQUET CUFF) IMPLANT
DEVICE SUCT BLK HOLE OR FLOOR (MISCELLANEOUS) ×2 IMPLANT
DRAPE ARTHRO LIMB 89X125 STRL (DRAPES) ×2 IMPLANT
DRAPE IMP U-DRAPE 54X76 (DRAPES) ×2 IMPLANT
ELECT REM PT RETURN 9FT ADLT (ELECTROSURGICAL) ×2
ELECTRODE REM PT RTRN 9FT ADLT (ELECTROSURGICAL) IMPLANT
GAUZE SPONGE 4X4 12PLY STRL (GAUZE/BANDAGES/DRESSINGS) ×2 IMPLANT
GLOVE SRG 8 PF TXTR STRL LF DI (GLOVE) ×1 IMPLANT
GLOVE SURG ORTHO LTX SZ8 (GLOVE) ×4 IMPLANT
GLOVE SURG UNDER POLY LF SZ8 (GLOVE) ×2
GOWN STRL REUS W/ TWL LRG LVL3 (GOWN DISPOSABLE) ×1 IMPLANT
GOWN STRL REUS W/ TWL XL LVL3 (GOWN DISPOSABLE) ×1 IMPLANT
GOWN STRL REUS W/TWL LRG LVL3 (GOWN DISPOSABLE) ×2
GOWN STRL REUS W/TWL XL LVL3 (GOWN DISPOSABLE) ×2
IV LACTATED RINGER IRRG 3000ML (IV SOLUTION) ×4
IV LR IRRIG 3000ML ARTHROMATIC (IV SOLUTION) ×4 IMPLANT
KIT TURNOVER KIT A (KITS) ×2 IMPLANT
MANIFOLD NEPTUNE II (INSTRUMENTS) ×3 IMPLANT
MAT ABSORB  FLUID 56X50 GRAY (MISCELLANEOUS) ×2
MAT ABSORB FLUID 56X50 GRAY (MISCELLANEOUS) ×2 IMPLANT
PACK ARTHROSCOPY KNEE (MISCELLANEOUS) ×2 IMPLANT
PAD ABD DERMACEA PRESS 5X9 (GAUZE/BANDAGES/DRESSINGS) ×4 IMPLANT
PAD WRAPON POLAR KNEE (MISCELLANEOUS) ×1 IMPLANT
PADDING CAST COTTON 6X4 ST (SOFTGOODS) ×1 IMPLANT
PENCIL ELECTRO HAND CTR (MISCELLANEOUS) IMPLANT
SPONGE T-LAP 18X18 ~~LOC~~+RFID (SPONGE) ×2 IMPLANT
SUT ETHILON 3-0 FS-10 30 BLK (SUTURE) ×2
SUT VICRYL AB 3-0 FS1 BRD 27IN (SUTURE) ×1 IMPLANT
SUTURE EHLN 3-0 FS-10 30 BLK (SUTURE) ×1 IMPLANT
TOWEL OR 17X26 4PK STRL BLUE (TOWEL DISPOSABLE) ×4 IMPLANT
TUBING INFLOW SET DBFLO PUMP (TUBING) ×2 IMPLANT
TUBING OUTFLOW SET DBLFO PUMP (TUBING) ×2 IMPLANT
WAND WEREWOLF FLOW 90D (MISCELLANEOUS) ×1 IMPLANT
WRAPON POLAR PAD KNEE (MISCELLANEOUS) ×2

## 2021-05-25 NOTE — H&P (Signed)
Paper H&P to be scanned into permanent record. H&P reviewed. No significant changes noted.  

## 2021-05-25 NOTE — Anesthesia Procedure Notes (Signed)
Procedure Name: LMA Insertion Date/Time: 05/25/2021 10:50 AM Performed by: Philbert Riser, CRNA Pre-anesthesia Checklist: Patient identified, Patient being monitored, Timeout performed, Emergency Drugs available and Suction available Patient Re-evaluated:Patient Re-evaluated prior to induction Oxygen Delivery Method: Circle system utilized Preoxygenation: Pre-oxygenation with 100% oxygen Induction Type: IV induction Ventilation: Mask ventilation without difficulty LMA: LMA inserted LMA Size: 3.5 Tube type: Oral Number of attempts: 1 Placement Confirmation: positive ETCO2 and breath sounds checked- equal and bilateral Tube secured with: Tape Dental Injury: Teeth and Oropharynx as per pre-operative assessment

## 2021-05-25 NOTE — Anesthesia Postprocedure Evaluation (Signed)
Anesthesia Post Note  Patient: Tamara [name]  Procedure(s) Performed: Left knee arthroscopy, infrapatellar fat pad excision (Left: Knee)  Patient location during evaluation: PACU Anesthesia Type: General Level of consciousness: awake and alert and oriented Pain management: pain level controlled Vital Signs Assessment: post-procedure vital signs reviewed and stable Respiratory status: spontaneous breathing, nonlabored ventilation and respiratory function stable Cardiovascular status: blood pressure returned to baseline and stable Postop Assessment: no signs of nausea or vomiting Anesthetic complications: no   No notable events documented.   Last Vitals:  Vitals:   05/25/21 1242 05/25/21 1257  BP: 119/62 (!) 124/59  Pulse: 92 87  Resp: (!) 21 16  Temp: 36.7 C 36.9 C  SpO2: 95% 97%    Last Pain:  Vitals:   05/25/21 1257  TempSrc: Oral  PainSc: 1                  Merek Niu

## 2021-05-25 NOTE — Discharge Instructions (Addendum)
Arthroscopic Knee Surgery   Post-Op Instructions   1. Bracing or crutches: Crutches will be provided at the time of discharge from the surgery center if you do not already have them.   2. Ice: You may be provided with a device [facility]) that allows you to ice the affected area effectively. Otherwise you can ice manually.    3. Driving:  Plan on not driving for at least one week. Please note that you are advised NOT to drive while taking narcotic pain medications as you may be impaired and unsafe to drive.   4. Activity: Ankle pumps several times an hour while awake to prevent blood clots. Weight bearing: as tolerated. Use crutches/walker as needed (usually ~1 week or less) until pain allows you to ambulate without a limp. Bending and straightening the knee is unlimited. Elevate knee above heart level as much as possible for one week. Avoid standing more than 5 minutes (consecutively) for the first week.  Avoid long distance travel for 2 weeks.   5. Medications:  - You have been provided a prescription for narcotic pain medicine. After surgery, take 1-2 narcotic tablets every 4 hours if needed for severe pain.  - You may take up to 308m/day of tylenol (acetaminophen). You can take 10063m3x/day. Please check your narcotic. If you have acetaminophen in your narcotic (each tablet will be 32536m be careful not to exceed a total of 3000m80my of acetaminophen.  - A prescription for anti-nausea medication will be provided in case the narcotic medicine causes nausea - take 1 tablet every 6 hours only if nauseated.  - Take ibuprofen 800 mg every 8 hours WITH food to reduce post-operative knee swelling. However, please discontinue if you have any abdominal discomfort after taking this.  - Take enteric coated aspirin 325 mg once daily for 2 weeks to prevent blood clots.   6. Bandages: The physical therapist should change the bandages at the first post-op appointment. If needed, the dressing supplies  have been provided to you.   7. Physical Therapy: 1-2 times per week for 6 weeks. Therapy typically starts on post operative Day 3 or 4. You have been provided an order for physical therapy. The therapist will provide home exercises.   8. Work: May return to full work usually around 2 weeks after 1st post-operative visit. May do light duty/desk job in approximately 1-2 weeks when off of narcotics, pain is well-controlled, and swelling has decreased. Labor intensive jobs may require 4-6 weeks to return.      9. Post-Op Appointments: Your first post-op appointment will be with Dr. PatePosey Prontoapproximately 2 weeks time.    If you find that they have not been scheduled please call the Orthopaedic Appointment front desk at [phone] SURGERY  DISCHARGE INSTRUCTIONS   The drugs that you were given will stay in your system until tomorrow so for the next 24 hours you should not:  Drive an automobile Make any legal decisions Drink any alcoholic beverage   You may resume regular meals tomorrow.  Today it is better to start with liquids and gradually work up to solid foods.  You may eat anything you prefer, but it is better to start with liquids, then soup and crackers, and gradually work up to solid foods.   Please notify your doctor immediately if you have any unusual bleeding, trouble breathing, redness and pain at the surgery site, drainage, fever, or pain not relieved by medication.    Your post-operative  visit with Dr.                                       is: Date:                        Time:    Please call to schedule your post-operative visit.  Additional Instructions:

## 2021-05-25 NOTE — Anesthesia Preprocedure Evaluation (Signed)
Anesthesia Evaluation  Patient identified by MRN, date of birth, ID band Patient awake    Reviewed: Allergy & Precautions, NPO status , Patient's Chart, lab work & pertinent test results  History of Anesthesia Complications Negative for: history of anesthetic complications  Airway Mallampati: II  TM Distance: >3 FB Neck ROM: Full    Dental no notable dental hx.    Pulmonary COPD, Current Smoker and Patient abstained from smoking.,    breath sounds clear to auscultation- rhonchi (-) wheezing      Cardiovascular (-) hypertension(-) CAD, (-) Past MI, (-) Cardiac Stents and (-) CABG  Rhythm:Regular Rate:Normal - Systolic murmurs and - Diastolic murmurs    Neuro/Psych  Headaches, neg Seizures PSYCHIATRIC DISORDERS Anxiety Depression    GI/Hepatic Neg liver ROS, GERD  ,  Endo/Other  negative endocrine ROSneg diabetes  Renal/GU negative Renal ROS     Musculoskeletal  (+) Arthritis ,   Abdominal (+) - obese,   Peds  Hematology negative hematology ROS (+)   Anesthesia Other Findings Past Medical History: No date: Arthritis No date: Bowel obstruction ([place])     Comment:  s/p laparotomy/ LOA 3/08, prior reversal of jejunal               bypasss 2004 No date: Chronic abdominal pain No date: COPD (chronic obstructive pulmonary disease) (HCC)     Comment:  no inhalers No date: Crohn's disease ([place])     Comment:  remission 2016 No date: Depression 04/24/2018: DVT (deep venous thrombosis) ([place]) 12/04/2017: Dysphagia 07/11/2018: Facial twitching No date: GERD (gastroesophageal reflux disease) No date: Headache 01/2007: History of DVT (deep vein thrombosis)     Comment:  post operative right leg, s/p vena cava filter No date: History of kidney stones 05/20/2019: Intractable chronic migraine without aura and without  status migrainosus No date: Leukocytosis No date: Leukocytosis No date: Osteoporosis    Reproductive/Obstetrics                             Anesthesia Physical Anesthesia Plan  ASA: 2  Anesthesia Plan: General   Post-op Pain Management:    Induction: Intravenous  PONV Risk Score and Plan: 1  Airway Management Planned: LMA  Additional Equipment:   Intra-op Plan:   Post-operative Plan:   Informed Consent: I have reviewed the patients History and Physical, chart, labs and discussed the procedure including the risks, benefits and alternatives for the proposed anesthesia with the patient or authorized representative who has indicated his/her understanding and acceptance.     Dental advisory given  Plan Discussed with: CRNA and Anesthesiologist  Anesthesia Plan Comments:         Anesthesia Quick Evaluation

## 2021-05-25 NOTE — Transfer of Care (Signed)
Immediate Anesthesia Transfer of Care Note  Patient: Tamara [name]  Procedure(s) Performed: Left knee arthroscopy, infrapatellar fat pad excision (Left: Knee)  Patient Location: PACU  Anesthesia Type:General  Level of Consciousness: awake, alert  and oriented  Airway & Oxygen Therapy: Patient Spontanous Breathing and Patient connected to face mask oxygen  Post-op Assessment: Report given to RN and Post -op Vital signs reviewed and stable  Post vital signs: Reviewed and stable  Last Vitals:  Vitals Value Taken Time  BP 134/68 05/25/21 1142  Temp 36.2 C 05/25/21 1140  Pulse 100 05/25/21 1143  Resp 16 05/25/21 1143  SpO2 100 % 05/25/21 1143  Vitals shown include unvalidated device data.  Last Pain:  Vitals:   05/25/21 1142  TempSrc:   PainSc: 0-No pain         Complications: No notable events documented.

## 2021-05-25 NOTE — Op Note (Signed)
Operative Note    SURGERY DATE: 05/25/2021   PRE-OP DIAGNOSIS: 1. Left infrapatellar fat pad synovitis 2. Left  medial and patellofemoral compartment degenerative changes   POST-OP DIAGNOSIS:  1. Left infrapatellar fat pad synovitis 2. Left  medial and patellofemoral compartment degenerative changes   PROCEDURES: 1.  Left knee arthroscopy, partial synovectomy with infrapatellar fat pad debridement 2.  Chondroplasty of patellofemoral and medial compartments   SURGEON: Cato Mulligan, MD   ANESTHESIA: Gen   ESTIMATED BLOOD LOSS: minimal   TOTAL IV FLUIDS: per anesthesia   INDICATION(S):  Tamara [name] is a 75 y.o. female who initially underwent left knee partial medial meniscectomy by me on 06/22/2020.  She did well initially postoperatively until approximately 6 weeks ago.  She developed significant pain about the anterolateral aspect of the knee and about the patellar tendon.  Conservative management consisting of activity modifications, medical management, and corticosteroid injection did not significantly improve her symptoms.  MRI was suggestive of significant inflammation within the infrapatellar fat pad, and this correlated to her region of most prominent daily symptoms.  After discussion of risks, benefits, and alternatives to surgery, the patient elected to proceed.  We did discuss that there was a chance that she may have persistent or recurrent symptoms after surgery.    OPERATIVE FINDINGS:  Examination under anesthesia: A careful examination under anesthesia was performed.  Passive range of motion was: Hyperextension: 1.  Extension: 0.  Flexion: 135.  Lachman: normal. Pivot Shift: normal.  Posterior drawer: normal.  Varus stability in full extension: normal.  Varus stability in 30 degrees of flexion: normal.  Valgus stability in full extension: normal.  Valgus stability in 30 degrees of flexion: normal.   Intra-operative findings: A thorough arthroscopic examination of the  knee was performed.  The findings are: 1. Suprapatellar pouch: Normal 2. Undersurface of median ridge: Grade 4 chondral defect 3. Medial patellar facet: Grade 1 softening 4. Lateral patellar facet: Grade 1 softening 5. Trochlea: Grade 1 degenerative changes 6. Lateral gutter/popliteus tendon: Normal 7. Hoffa's fat pad: Significant inflammation and synovitic changes anterior to the lateral tibial plateau and patellar tendon 8. Medial gutter/plica: Normal 9. ACL: Normal, significant chondrocalcinosis about the ACL and intercondylar notch 10. PCL: Normal 11. Medial meniscus: Prior partial medial meniscectomy without evidence of recurrent tear  12. Medial compartment cartilage: Focal grade 3 degenerative changes to the medial femoral condyle; grade 2 degenerative changes to the tibial plateau; significant chondrocalcinosis 13. Lateral meniscus: Normal 14. Lateral compartment cartilage: Grade 1-2 degenerative changes to the tibial plateau; normal lateral femoral condyle; significant chondrocalcinosis    OPERATIVE REPORT:    I identified Tamara [name] in the pre-operative holding area. I marked the operative knee with my initials. I reviewed the risks and benefits of the proposed surgical intervention and the patient wished to proceed. The patient was transferred to the operative suite and placed in the supine position with all bony prominences padded.  Anesthesia was administered. Appropriate IV antibiotics were administered prior to incision. The extremity was then prepped and draped in standard fashion. A time out was performed confirming the correct extremity, correct patient, and correct procedure.   Arthroscopy portals were marked. Local anesthetic was injected to the planned portal sites. The anterolateral portal was established with an 11 blade.      The arthroscope was placed in the anterolateral portal and then into the suprapatellar pouch. Next, the medial portal was established under  needle localization. A diagnostic knee scope was  completed with the above findings.    The arthroscope was switched to the anteromedial portal.  Using an oscillating shaver, a partial synovectomy of the infrapatellar fat pad was performed.  There was significant synovitic changes about the fat pad along the posterior border of the patellar tendon and anterior to the lateral tibial plateau.  All of this reddish, inflamed tissue was debrided using an oscillating shaver.  The cam was then switched back to the anterolateral portal and a similar fat pad debridement was performed from the medial portal.  Arthroscopic fluid was removed from the joint.   The portals were closed with 3-0 Nylon suture. Sterile dressings included Xeroform, 4x4s, Sof-Rol, and Bias wrap. A Polarcare was placed.  The patient was then awakened and taken to the PACU hemodynamically stable without complication.   POSTOPERATIVE PLAN: The patient will be discharged home today once they meet PACU criteria. Aspirin 325 mg daily was prescribed for 2 weeks for DVT prophylaxis.  Physical therapy will start on POD#3-4. Weight-bearing as tolerated. Follow up in 2 weeks per protocol.

## 2021-05-26 ENCOUNTER — Encounter: Payer: Self-pay | Admitting: Orthopedic Surgery

## 2021-05-28 DIAGNOSIS — T85612A Breakdown (mechanical) of permanent sutures, initial encounter: Secondary | ICD-10-CM | POA: Diagnosis not present

## 2021-05-28 DIAGNOSIS — M65162 Other infective (teno)synovitis, left knee: Secondary | ICD-10-CM | POA: Diagnosis not present

## 2021-06-01 ENCOUNTER — Ambulatory Visit
Admission: RE | Admit: 2021-06-01 | Discharge: 2021-06-01 | Disposition: A | Discharge status: Home / Self Care | Payer: Medicare Other | Source: Ambulatory Visit | Attending: Orthopedic Surgery | Admitting: Orthopedic Surgery

## 2021-06-01 ENCOUNTER — Other Ambulatory Visit: Payer: Self-pay

## 2021-06-01 ENCOUNTER — Other Ambulatory Visit: Payer: Self-pay | Admitting: Orthopedic Surgery

## 2021-06-01 ENCOUNTER — Other Ambulatory Visit (HOSPITAL_COMMUNITY): Payer: Self-pay | Admitting: Orthopedic Surgery

## 2021-06-01 DIAGNOSIS — M7989 Other specified soft tissue disorders: Secondary | ICD-10-CM

## 2021-06-01 DIAGNOSIS — R600 Localized edema: Secondary | ICD-10-CM | POA: Diagnosis not present

## 2021-06-01 DIAGNOSIS — Z87828 Personal history of other (healed) physical injury and trauma: Secondary | ICD-10-CM | POA: Diagnosis not present

## 2021-06-01 DIAGNOSIS — M79605 Pain in left leg: Secondary | ICD-10-CM | POA: Diagnosis not present

## 2021-06-01 DIAGNOSIS — I82402 Acute embolism and thrombosis of unspecified deep veins of left lower extremity: Secondary | ICD-10-CM | POA: Diagnosis not present

## 2021-06-04 DIAGNOSIS — M25562 Pain in left knee: Secondary | ICD-10-CM | POA: Diagnosis not present

## 2021-06-04 DIAGNOSIS — M25662 Stiffness of left knee, not elsewhere classified: Secondary | ICD-10-CM | POA: Diagnosis not present

## 2021-06-04 DIAGNOSIS — M6281 Muscle weakness (generalized): Secondary | ICD-10-CM | POA: Diagnosis not present

## 2021-06-04 DIAGNOSIS — G8929 Other chronic pain: Secondary | ICD-10-CM | POA: Diagnosis not present

## 2021-06-04 DIAGNOSIS — Z9889 Other specified postprocedural states: Secondary | ICD-10-CM | POA: Diagnosis not present

## 2021-06-04 IMAGING — CR DG SINUSES COMPLETE 3+V
1 series · 4 of 4 positions shown · non-contrast
Comparison: 08/26/2016

CLINICAL DATA: Evaluate for sinusitis.

EXAM:
PARANASAL SINUSES - COMPLETE 3 + VIEW

[Series 1: dg sinuses complete · 0.14mm/px · 4 of 4 slices shown]
[im 1/4]
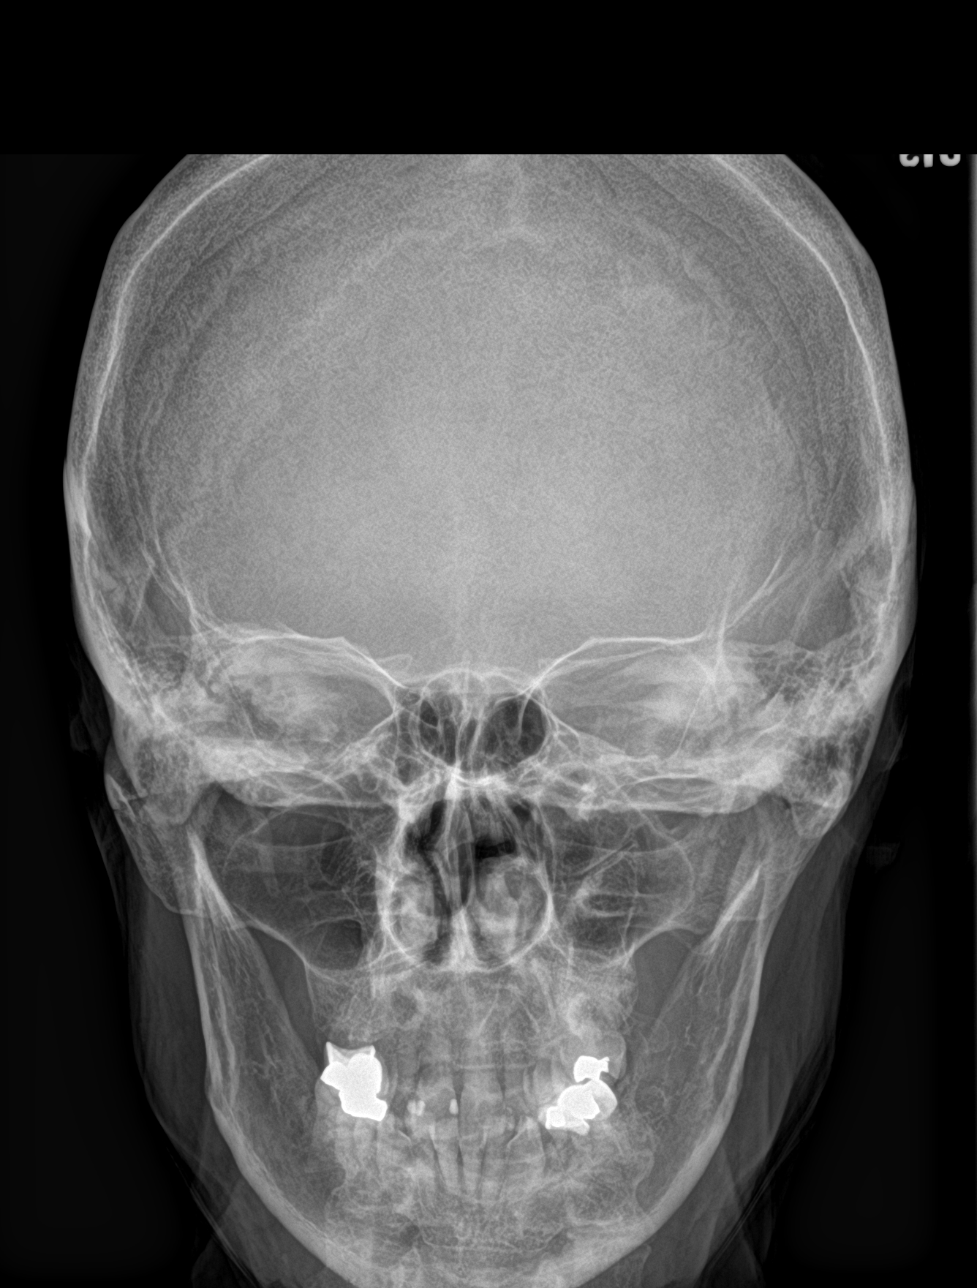
[im 2/4]
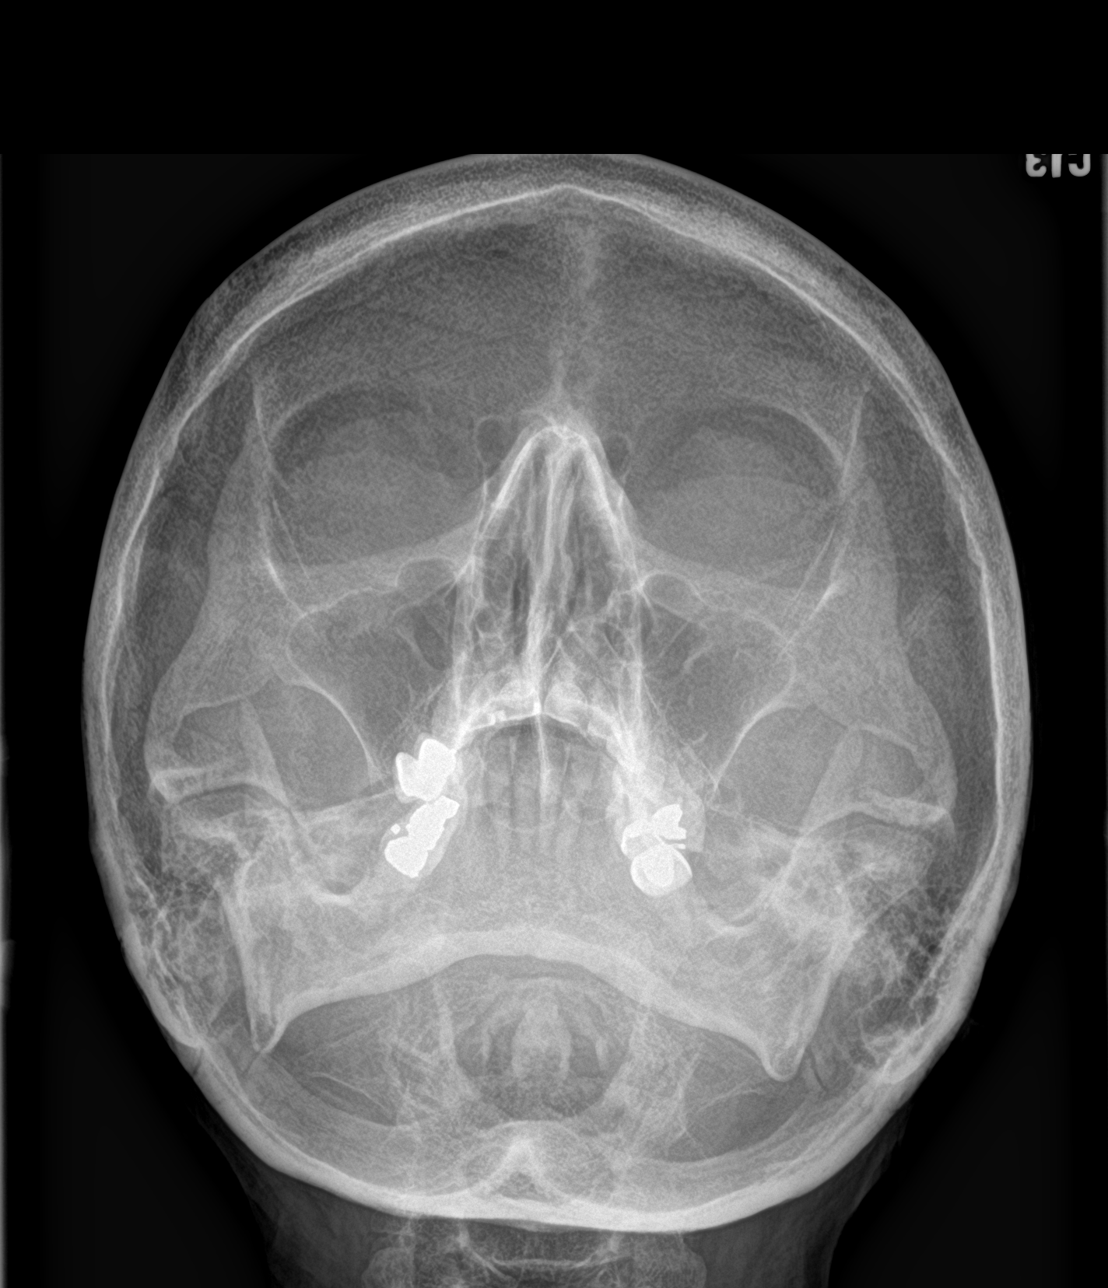
[im 3/4]
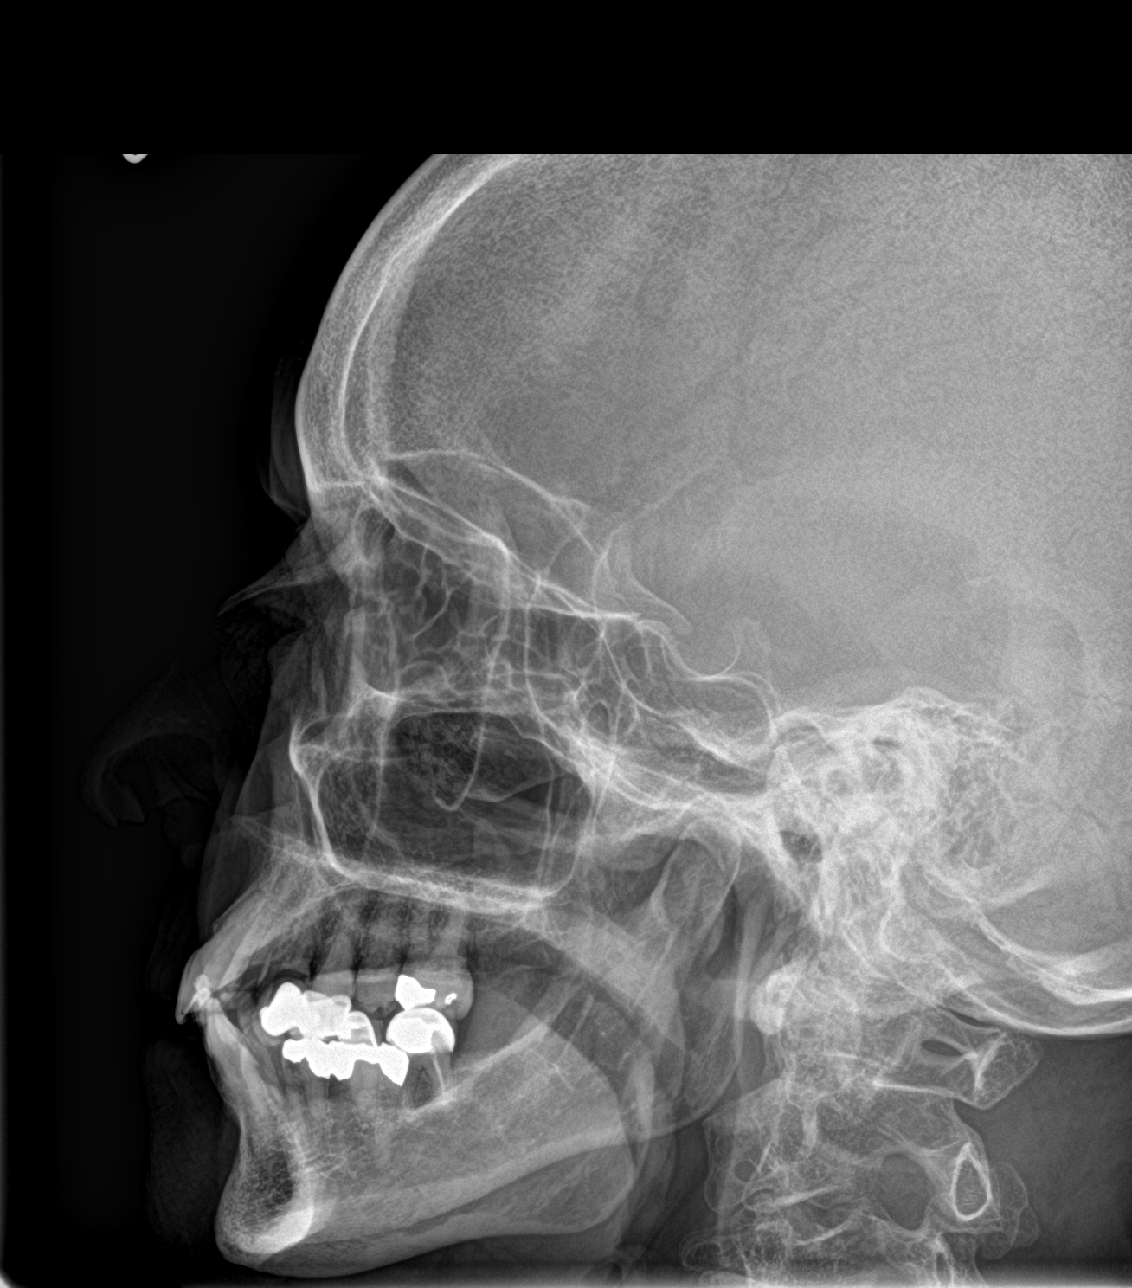
[im 4/4]
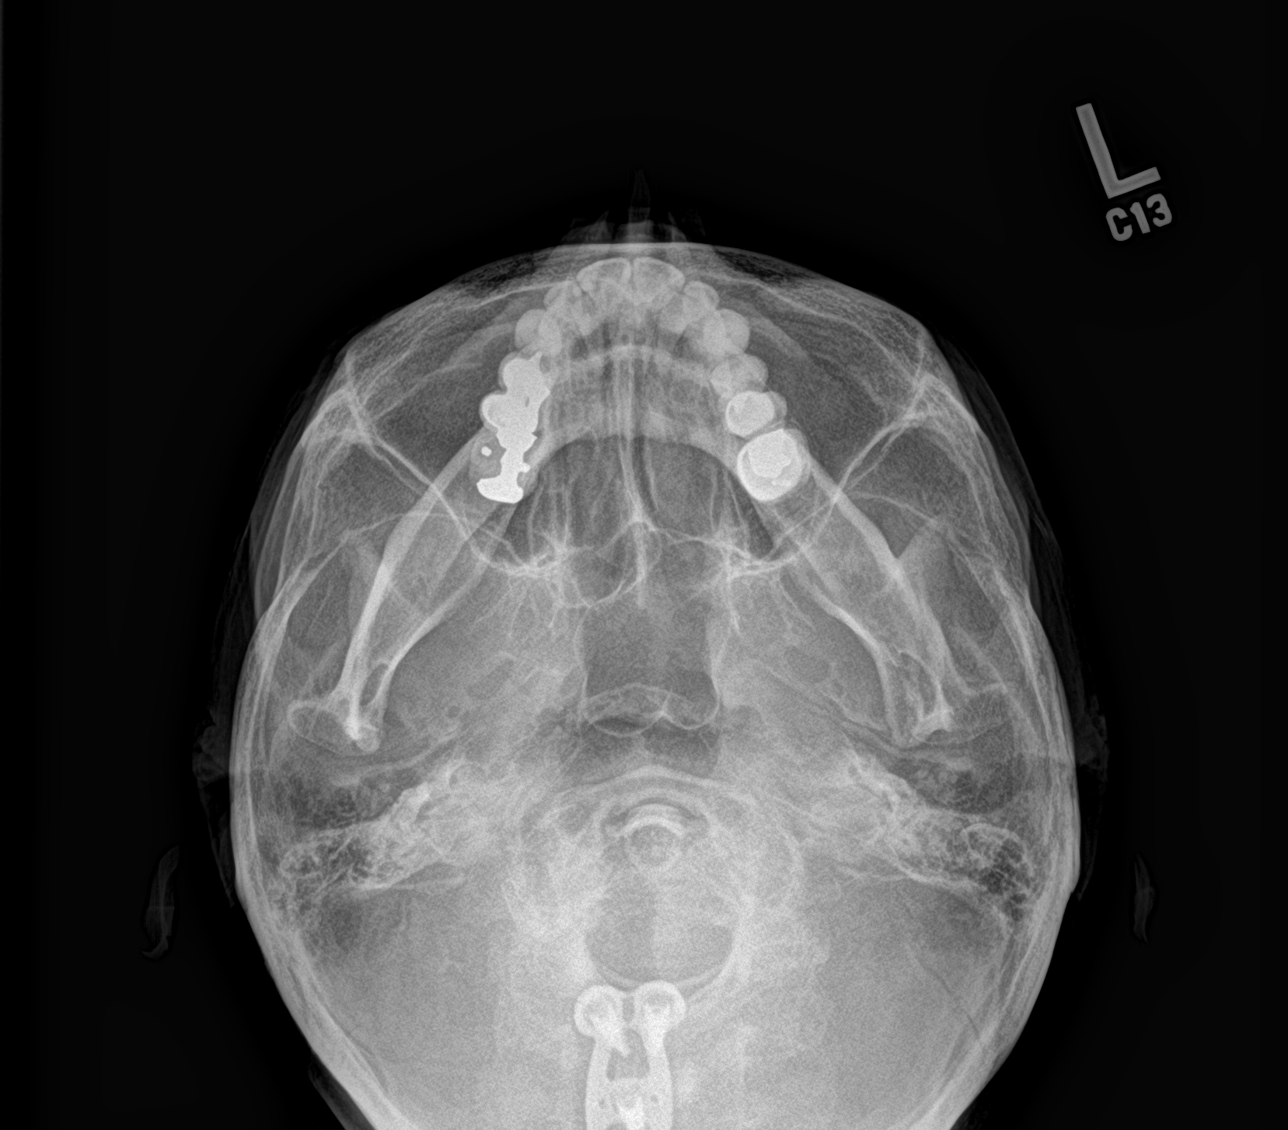

[4 of 4 positions shown; findings below may reference images not displayed]

FINDINGS: No fluid in the paranasal sinuses. No significant mucosal
thickening. No osseous destruction.
IMPRESSION: No acute findings.

## 2021-06-04 NOTE — Telephone Encounter (Signed)
Pt called and states that Dr Posey Pronto put her on a blood thinner and states that he wants PCP to monitor and continue with medication.

## 2021-06-05 ENCOUNTER — Telehealth: Payer: Self-pay | Admitting: Internal Medicine

## 2021-06-05 NOTE — Telephone Encounter (Signed)
Left message for patient to call back and schedule Medicare Annual Wellness Visit (AWV) in office.   If not able to come in office, please offer to do virtually or by telephone.   Last AWV:04/15/2016  Please schedule at anytime with Nurse Health Advisor.

## 2021-06-07 NOTE — Telephone Encounter (Signed)
Spoke with pt and she has been scheduled for 2pm tomorrow. Pt gave a verbal understanding.

## 2021-06-08 ENCOUNTER — Other Ambulatory Visit: Payer: Self-pay

## 2021-06-08 ENCOUNTER — Encounter: Payer: Self-pay | Admitting: Internal Medicine

## 2021-06-08 ENCOUNTER — Other Ambulatory Visit: Payer: Self-pay | Admitting: Internal Medicine

## 2021-06-08 ENCOUNTER — Ambulatory Visit (INDEPENDENT_AMBULATORY_CARE_PROVIDER_SITE_OTHER): Payer: Medicare Other | Admitting: Internal Medicine

## 2021-06-08 VITALS — BP 118/66 | HR 96 | Temp 96.7°F | Resp 16 | Ht 61.0 in | Wt 94.4 lb

## 2021-06-08 DIAGNOSIS — D62 Acute posthemorrhagic anemia: Secondary | ICD-10-CM

## 2021-06-08 DIAGNOSIS — I82452 Acute embolism and thrombosis of left peroneal vein: Secondary | ICD-10-CM | POA: Diagnosis not present

## 2021-06-08 DIAGNOSIS — Z7901 Long term (current) use of anticoagulants: Secondary | ICD-10-CM

## 2021-06-08 MED ORDER — WARFARIN SODIUM 5 MG PO TABS: 5.0000 mg | ORAL_TABLET | Freq: Every day | ORAL | 3 refills | Status: DC

## 2021-06-08 NOTE — Progress Notes (Signed)
Subjective:  Patient ID: Tamara [name], female    DOB: Aug 16, 1946  Age: 75 y.o. MRN: [id number]  CC: The primary encounter diagnosis was Anticoagulant long-term use. Diagnoses of Acute deep vein thrombosis (DVT) of left peroneal vein (H[facility]) and Anemia due to acute blood loss were also pertinent to this visit.  HPI Tamara [name] presents for change in anticoagulation therapy  This visit occurred during the SARS-CoV-2 public health emergency.  Safety protocols were in place, including screening questions prior to the visit, additional usage of staff PPE, and extensive cleaning of exam room while observing appropriate contact time as indicated for disinfecting solutions.    Tamara [name] is a 75 yr old female with a history of Crohn's disease, CAD and CAD who underwent  knee arthroscopy with synovectomy (partial) of left knee by Dr Leim Fabry   on July 15.  She has excessive bleeding,  with a drop in hgb from 12 to 10.8 at follow up one week later and  was found to have a  peroneal vein DVT  which was diagnosed by DVT scan  on 06/01/2021.  She was started on Eliquis  10 mg bid x 7 days  on July 22 but has not been tolerating it due to nausea and dizziness.   Dr Posey Pronto has referred her to me for change in to warfarin,  which she has tolerated in the remote past  Her last available dose of Eliquis was, per Dr patel,  to be on Monday.   HOwever she states that she has only 2 5 mg tablets left for tonight's evening dose.  Half life is 12 hours.  Given her nausea ,  Will plan to start warfarin tonight to overlap with the eliquis last dose    Outpatient Medications Prior to Visit  Medication Sig Dispense Refill   apixaban (ELIQUIS) 5 MG TABS tablet Take by mouth.     Cholecalciferol (VITAMIN D3) 50 MCG (2000 UT) capsule Take 1 capsule (2,000 Units total) by mouth daily. 90 capsule 3   clonazePAM (KLONOPIN) 0.5 MG tablet TAKE 1 TABLET BY MOUTH ONCE DAILY AS NEEDED ANXIETY (Patient taking differently: Take  0.5 mg by mouth at bedtime.) 30 tablet 5   cyanocobalamin (,VITAMIN B-12,) 1000 MCG/ML injection Inject 1 mL (1,000 mcg total) into the muscle every 30 (thirty) days. 10 mL 2   esomeprazole (NEXIUM) 40 MG capsule Take by mouth.     Fluticasone-Salmeterol (ADVAIR DISKUS) 250-50 MCG/DOSE AEPB Inhale 1 puff into the lungs 2 (two) times daily. 1 each 3   ibuprofen (ADVIL) 800 MG tablet Take 1 tablet (800 mg total) by mouth 3 (three) times daily for 14 days. 60 tablet 1   nortriptyline (PAMELOR) 25 MG capsule TAKE 1 CAPSULE BY MOUTH AT BEDTIME 90 capsule 1   ondansetron (ZOFRAN ODT) 4 MG disintegrating tablet Take 1 tablet (4 mg total) by mouth every 8 (eight) hours as needed for nausea or vomiting. 20 tablet 0   rosuvastatin (CRESTOR) 10 MG tablet TAKE 1 TABLET BY MOUTH AT BEDTIME (Patient taking differently: Take 10 mg by mouth at bedtime.) 90 tablet 1   sertraline (ZOLOFT) 100 MG tablet TAKE 1 TABLET BY MOUTH ONCE DAILY (Patient taking differently: Take 100 mg by mouth at bedtime.) 90 tablet 1   Syringe/Needle, Disp, (SYRINGE 3CC/25GX1") 25G X 1" 3 ML MISC Use for b12 injections 50 each 0   tiotropium (SPIRIVA HANDIHALER) 18 MCG inhalation capsule Place 1 capsule (18 mcg total) into inhaler and  inhale daily. 30 capsule 12   traZODone (DESYREL) 50 MG tablet TAKE 2 TABLETS BY MOUTH AT BEDTIME (Patient taking differently: Take 100 mg by mouth at bedtime.) 180 tablet 1   HYDROcodone-acetaminophen (NORCO) 5-325 MG tablet Take 1-2 tablets by mouth every 4 (four) hours as needed for moderate pain or severe pain. (Patient not taking: Reported on 06/08/2021) 10 tablet 0   acetaminophen (TYLENOL) 500 MG tablet Take 2 tablets (1,000 mg total) by mouth every 8 (eight) hours. (Patient not taking: Reported on 06/08/2021) 90 tablet 2   aspirin EC 325 MG tablet Take 1 tablet (325 mg total) by mouth daily for 14 days. (Patient not taking: Reported on 06/08/2021) 14 tablet 0   ibuprofen (ADVIL) 200 MG tablet Take 400 mg by  mouth every 6 (six) hours as needed. (Patient not taking: Reported on 06/08/2021)     omeprazole (PRILOSEC) 40 MG capsule TAKE 1 CAPSULE BY MOUTH TWICE DAILY (Patient not taking: Reported on 06/08/2021) 180 capsule 1   ondansetron (ZOFRAN ODT) 4 MG disintegrating tablet Take 1 tablet (4 mg total) by mouth every 8 (eight) hours as needed for nausea or vomiting. (Patient not taking: Reported on 06/08/2021) 20 tablet 0   No facility-administered medications prior to visit.    Review of Systems;  Patient denies headache, fevers, malaise, unintentional weight loss, skin rash, eye pain, sinus congestion and sinus pain, sore throat, dysphagia,  hemoptysis , cough, dyspnea, wheezing, chest pain, palpitations, orthopnea, edema, abdominal pain, nausea, melena, diarrhea, constipation, flank pain, dysuria, hematuria, urinary  Frequency, nocturia, numbness, tingling, seizures,  Focal weakness, Loss of consciousness,  Tremor, insomnia, depression, anxiety, and suicidal ideation.      Objective:  BP 118/66 (BP Location: Left Arm, Patient Position: Sitting, Cuff Size: Normal)   Pulse 96   Temp (!) 96.7 F (35.9 C) (Temporal)   Resp 16   Ht 5' 1"  (1.549 m)   Wt 94 lb 6.4 oz (42.8 kg)   SpO2 98%   BMI 17.84 kg/m   BP Readings from Last 3 Encounters:  06/08/21 118/66  05/25/21 (!) 124/59  05/03/21 (!) 96/52    Wt Readings from Last 3 Encounters:  06/08/21 94 lb 6.4 oz (42.8 kg)  05/25/21 95 lb (43.1 kg)  05/03/21 96 lb (43.5 kg)    General appearance: alert, cooperative and appears stated age Ears: normal TM's and external ear canals both ears Throat: lips, mucosa, and tongue normal; teeth and gums normal Neck: no adenopathy, no carotid bruit, supple, symmetrical, trachea midline and thyroid not enlarged, symmetric, no tenderness/mass/nodules Back: symmetric, no curvature. ROM normal. No CVA tenderness. Lungs: clear to auscultation bilaterally Heart: regular rate and rhythm, S1, S2 normal, no  murmur, click, rub or gallop Abdomen: soft, non-tender; bowel sounds normal; no masses,  no organomegaly Pulses: 2+ and symmetric Skin: ecchymotic changes fro superior medial thigh to ankle. Ping pong ball sized effusion medial side of knee S Lymph nodes: Cervical, supraclavicular, and axillary nodes normal.  Lab Results  Component Value Date   HGBA1C 6.1 09/21/2020    Lab Results  Component Value Date   CREATININE 0.72 08/28/2020   CREATININE 0.73 06/12/2020   CREATININE 0.73 01/26/2020    Lab Results  Component Value Date   WBC 9.1 05/24/2021   HGB 12.5 05/24/2021   HCT 36.7 05/24/2021   PLT 232 05/24/2021   GLUCOSE 126 (H) 08/28/2020   CHOL 188 01/26/2020   TRIG 182.0 (H) 01/26/2020   HDL 58.90 01/26/2020  LDLDIRECT 121.1 01/26/2014   LDLCALC 92 01/26/2020   ALT 38 (H) 09/21/2020   AST 30 09/21/2020   NA 139 08/28/2020   K 4.2 08/28/2020   CL 104 08/28/2020   CREATININE 0.72 08/28/2020   BUN 12 08/28/2020   CO2 26 08/28/2020   TSH 1.74 06/11/2017   INR 0.85 12/18/2016   HGBA1C 6.1 09/21/2020    US Venous Img Lower Unilateral Left (DVT)  Result Date: 06/01/2021 CLINICAL DATA:  Left lower extremity pain, edema and bruising. Recent surgery 1 week ago EXAM: LEFT LOWER EXTREMITY VENOUS DOPPLER ULTRASOUND TECHNIQUE: Gray-scale sonography with graded compression, as well as color Doppler and duplex ultrasound were performed to evaluate the lower extremity deep venous systems from the level of the common femoral vein and including the common femoral, femoral, profunda femoral, popliteal and calf veins including the posterior tibial, peroneal and gastrocnemius veins when visible. The superficial great saphenous vein was also interrogated. Spectral Doppler was utilized to evaluate flow at rest and with distal augmentation maneuvers in the common femoral, femoral and popliteal veins. COMPARISON:  None. FINDINGS: Contralateral Common Femoral Vein: Respiratory phasicity is normal  and symmetric with the symptomatic side. No evidence of thrombus. Normal compressibility. Common Femoral Vein: No evidence of thrombus. Normal compressibility, respiratory phasicity and response to augmentation. Saphenofemoral Junction: No evidence of thrombus. Normal compressibility and flow on color Doppler imaging. Profunda Femoral Vein: No evidence of thrombus. Normal compressibility and flow on color Doppler imaging. Femoral Vein: No evidence of thrombus. Normal compressibility, respiratory phasicity and response to augmentation. Popliteal Vein: No evidence of thrombus. Normal compressibility, respiratory phasicity and response to augmentation. Calf Veins: Posterior tibial vein appears patent and compressible. Left peroneal vein is noncompressible without definite color flow with augmentation compatible with calf region left peroneal DVT. Very low thrombus burden. No propagation into the popliteal or femoral veins. IMPRESSION: Isolated left peroneal calf DVT.  Very low thrombus burden. Electronically Signed   By: Jerilynn Mages.  Shick M.D.   On: 06/01/2021 16:54    Assessment & Plan:   Problem List Items Addressed This Visit       Unprioritized   Anticoagulant long-term use - Primary   Relevant Orders   CBC with Differential/Platelet   Protime-INR   Acute deep vein thrombosis (DVT) of left peroneal vein ([place])    Occurring post operatively.  Records reviewed.  She is on Day 7 of Eliquis and not tolerating it.  Starting coumadin 5 mg daily tonight with repeat INR on Monday  .       Relevant Medications   warfarin (COUMADIN) 5 MG tablet   apixaban (ELIQUIS) 5 MG TABS tablet   Anemia    She has extensive ecchymoses fro the proximal thigh to her ankle.  Outside hgb from 7/22 was 10.8  Which is a significant drop from post operative hgb of 12.5  CBC Latest Ref Rng & Units 05/24/2021 01/29/2021 06/12/2020  WBC 4.0 - 10.5 K/uL 9.1 8.3 10.2  Hemoglobin 12.0 - 15.0 g/dL 12.5 13.2 13.6  Hematocrit 36.0 - 46.0  % 36.7 39.6 39.5  Platelets 150 - 400 K/uL 232 297.0 263        I provided  30 minutes  during this encounter reviewing patient's current problems and recent surgery, labs and imaging studies, providing counseling on there anticoagulation and her anemia, and coordination  of care .   Follow-up: No follow-ups on file.   Crecencio Mc, MD

## 2021-06-08 NOTE — Patient Instructions (Addendum)
FINISH YOUR ELIQUIS  REGIMEN  AND START COUMADIN TONIGHT 5 MG   COME IN ON Monday FOR A PT INR  SO WE CAN ADJUST YOUR COUMADIN DOSE  YOUR CLONAZEPAM PRESCRIPTION WAS RENEWED LAST MONTH WITH 5 ADDITIONAL REFILLS

## 2021-06-10 DIAGNOSIS — D649 Anemia, unspecified: Secondary | ICD-10-CM | POA: Insufficient documentation

## 2021-06-10 DIAGNOSIS — Z7901 Long term (current) use of anticoagulants: Secondary | ICD-10-CM | POA: Insufficient documentation

## 2021-06-10 DIAGNOSIS — I82452 Acute embolism and thrombosis of left peroneal vein: Secondary | ICD-10-CM | POA: Insufficient documentation

## 2021-06-10 NOTE — Assessment & Plan Note (Addendum)
Occurring post operatively.  Records reviewed.  She is on Day 7 of Eliquis and not tolerating it.  Starting coumadin 5 mg daily tonight with repeat INR on Monday  .

## 2021-06-10 NOTE — Assessment & Plan Note (Addendum)
She has extensive ecchymoses fro the proximal thigh to her ankle.  Outside hgb from 7/22 was 10.8  Which is a significant drop from post operative hgb of 12.5  CBC Latest Ref Rng & Units 05/24/2021 01/29/2021 06/12/2020  WBC 4.0 - 10.5 K/uL 9.1 8.3 10.2  Hemoglobin 12.0 - 15.0 g/dL 12.5 13.2 13.6  Hematocrit 36.0 - 46.0 % 36.7 39.6 39.5  Platelets 150 - 400 K/uL 232 297.0 263

## 2021-06-11 ENCOUNTER — Telehealth: Payer: Self-pay

## 2021-06-11 ENCOUNTER — Other Ambulatory Visit (INDEPENDENT_AMBULATORY_CARE_PROVIDER_SITE_OTHER): Payer: Medicare Other

## 2021-06-11 ENCOUNTER — Other Ambulatory Visit: Payer: Self-pay

## 2021-06-11 DIAGNOSIS — D62 Acute posthemorrhagic anemia: Secondary | ICD-10-CM

## 2021-06-11 DIAGNOSIS — Z7901 Long term (current) use of anticoagulants: Secondary | ICD-10-CM | POA: Diagnosis not present

## 2021-06-11 LAB — PROTIME-INR
INR: 1.6 ratio — ABNORMAL HIGH (ref 0.8–1.0)
Prothrombin Time: 17 s — ABNORMAL HIGH (ref 9.6–13.1)

## 2021-06-11 NOTE — Telephone Encounter (Signed)
Order placed for lab appt.

## 2021-06-11 NOTE — Telephone Encounter (Signed)
Lab reordered

## 2021-06-13 ENCOUNTER — Other Ambulatory Visit: Payer: Self-pay | Admitting: Internal Medicine

## 2021-06-13 MED ORDER — WARFARIN SODIUM 1 MG PO TABS: ORAL_TABLET | ORAL | 1 refills | Status: DC

## 2021-06-15 ENCOUNTER — Other Ambulatory Visit: Payer: Self-pay | Admitting: *Deleted

## 2021-06-15 DIAGNOSIS — Z7901 Long term (current) use of anticoagulants: Secondary | ICD-10-CM

## 2021-06-18 ENCOUNTER — Other Ambulatory Visit (INDEPENDENT_AMBULATORY_CARE_PROVIDER_SITE_OTHER): Payer: Medicare Other

## 2021-06-18 ENCOUNTER — Other Ambulatory Visit: Payer: Self-pay

## 2021-06-18 DIAGNOSIS — Z7901 Long term (current) use of anticoagulants: Secondary | ICD-10-CM

## 2021-06-18 LAB — PROTIME-INR
INR: 2.9 ratio — ABNORMAL HIGH (ref 0.8–1.0)
Prothrombin Time: 30.2 s — ABNORMAL HIGH (ref 9.6–13.1)

## 2021-06-19 ENCOUNTER — Other Ambulatory Visit: Payer: Self-pay | Admitting: Internal Medicine

## 2021-06-19 DIAGNOSIS — Z7901 Long term (current) use of anticoagulants: Secondary | ICD-10-CM

## 2021-06-19 NOTE — Assessment & Plan Note (Signed)
INR has jumped quite a bit after increasing daily dose of coumadin from 5 to 6 mg daily.  Please change regimen to 5 mg alternating with 6 mg and recheck in 2 weeks  Lab Results  Component Value Date   INR 2.9 (H) 06/18/2021   INR 1.6 (H) 06/11/2021   INR 0.85 12/18/2016

## 2021-06-20 DIAGNOSIS — M96842 Postprocedural seroma of a musculoskeletal structure following a musculoskeletal system procedure: Secondary | ICD-10-CM | POA: Diagnosis not present

## 2021-06-20 DIAGNOSIS — M1712 Unilateral primary osteoarthritis, left knee: Secondary | ICD-10-CM | POA: Diagnosis not present

## 2021-06-20 DIAGNOSIS — M7989 Other specified soft tissue disorders: Secondary | ICD-10-CM | POA: Diagnosis not present

## 2021-06-20 DIAGNOSIS — M65162 Other infective (teno)synovitis, left knee: Secondary | ICD-10-CM | POA: Diagnosis not present

## 2021-06-20 DIAGNOSIS — M25462 Effusion, left knee: Secondary | ICD-10-CM | POA: Diagnosis not present

## 2021-07-05 ENCOUNTER — Other Ambulatory Visit: Payer: Medicare Other

## 2021-07-06 ENCOUNTER — Other Ambulatory Visit (INDEPENDENT_AMBULATORY_CARE_PROVIDER_SITE_OTHER): Payer: Medicare Other

## 2021-07-06 ENCOUNTER — Other Ambulatory Visit: Payer: Self-pay

## 2021-07-06 DIAGNOSIS — Z7901 Long term (current) use of anticoagulants: Secondary | ICD-10-CM

## 2021-07-06 HISTORY — DX: Long term (current) use of anticoagulants: Z79.01

## 2021-07-06 LAB — PROTIME-INR
INR: 1.9 ratio — ABNORMAL HIGH (ref 0.8–1.0)
Prothrombin Time: 19.6 s — ABNORMAL HIGH (ref 9.6–13.1)

## 2021-07-06 NOTE — Addendum Note (Signed)
Addended by: Crecencio Mc on: 07/06/2021 03:56 PM   Modules accepted: Orders

## 2021-07-06 NOTE — Assessment & Plan Note (Signed)
Dose increased to 40 mg weekly (6 mg x 5,  5 mg x 2 )

## 2021-07-09 ENCOUNTER — Telehealth: Payer: Self-pay

## 2021-07-09 NOTE — Telephone Encounter (Signed)
LMTCB in regards to lab results.  

## 2021-07-09 NOTE — Telephone Encounter (Signed)
Patient is returning your call from earlier. 

## 2021-07-10 NOTE — Telephone Encounter (Signed)
See result note message 

## 2021-07-12 ENCOUNTER — Other Ambulatory Visit: Payer: Self-pay | Admitting: Internal Medicine

## 2021-07-12 DIAGNOSIS — R1313 Dysphagia, pharyngeal phase: Secondary | ICD-10-CM

## 2021-07-12 DIAGNOSIS — Z8619 Personal history of other infectious and parasitic diseases: Secondary | ICD-10-CM | POA: Diagnosis not present

## 2021-07-13 DIAGNOSIS — Z8619 Personal history of other infectious and parasitic diseases: Secondary | ICD-10-CM | POA: Diagnosis not present

## 2021-07-13 DIAGNOSIS — R197 Diarrhea, unspecified: Secondary | ICD-10-CM | POA: Diagnosis not present

## 2021-07-19 ENCOUNTER — Ambulatory Visit
Admission: RE | Admit: 2021-07-19 | Discharge: 2021-07-19 | Disposition: A | Discharge status: Home / Self Care | Payer: Medicare Other | Source: Ambulatory Visit | Attending: Internal Medicine | Admitting: Internal Medicine

## 2021-07-19 DIAGNOSIS — K219 Gastro-esophageal reflux disease without esophagitis: Secondary | ICD-10-CM | POA: Diagnosis not present

## 2021-07-19 DIAGNOSIS — R1313 Dysphagia, pharyngeal phase: Secondary | ICD-10-CM | POA: Diagnosis not present

## 2021-07-24 ENCOUNTER — Other Ambulatory Visit (INDEPENDENT_AMBULATORY_CARE_PROVIDER_SITE_OTHER): Payer: Medicare Other

## 2021-07-24 ENCOUNTER — Other Ambulatory Visit: Payer: Self-pay

## 2021-07-24 DIAGNOSIS — Z5181 Encounter for therapeutic drug level monitoring: Secondary | ICD-10-CM | POA: Diagnosis not present

## 2021-07-24 DIAGNOSIS — Z23 Encounter for immunization: Secondary | ICD-10-CM

## 2021-07-24 DIAGNOSIS — Z7901 Long term (current) use of anticoagulants: Secondary | ICD-10-CM

## 2021-07-24 LAB — PROTIME-INR
INR: 1.6 ratio — ABNORMAL HIGH (ref 0.8–1.0)
Prothrombin Time: 16.9 s — ABNORMAL HIGH (ref 9.6–13.1)

## 2021-07-27 NOTE — Assessment & Plan Note (Signed)
INr low .  Increase dose to 6 mg daily   10 mg tonight repeat inr 2 week s  Lab Results  Component Value Date   INR 1.6 (H) 07/24/2021   INR 1.9 (H) 07/06/2021   INR 2.9 (H) 06/18/2021

## 2021-07-27 NOTE — Addendum Note (Signed)
Addended by: Crecencio Mc on: 07/27/2021 02:44 PM   Modules accepted: Orders

## 2021-07-30 ENCOUNTER — Telehealth: Payer: Self-pay

## 2021-07-30 NOTE — Telephone Encounter (Signed)
Patient called into access nurse stating that she has a headache and a fever of 99.5. Pt states that she is feeling much better at this time. Shaelyn went to Doctor on demand and was given a box of Paxlovid medication. Pt had no other concerns or questions at this time.

## 2021-08-07 ENCOUNTER — Other Ambulatory Visit: Payer: Self-pay | Admitting: Orthopedic Surgery

## 2021-08-07 DIAGNOSIS — M659 Synovitis and tenosynovitis, unspecified: Secondary | ICD-10-CM

## 2021-08-13 ENCOUNTER — Other Ambulatory Visit (INDEPENDENT_AMBULATORY_CARE_PROVIDER_SITE_OTHER): Payer: Medicare Other

## 2021-08-13 ENCOUNTER — Other Ambulatory Visit: Payer: Self-pay

## 2021-08-13 DIAGNOSIS — Z5181 Encounter for therapeutic drug level monitoring: Secondary | ICD-10-CM

## 2021-08-13 DIAGNOSIS — Z7901 Long term (current) use of anticoagulants: Secondary | ICD-10-CM

## 2021-08-13 LAB — PROTIME-INR
INR: 1.5 ratio — ABNORMAL HIGH (ref 0.8–1.0)
Prothrombin Time: 16.6 s — ABNORMAL HIGH (ref 9.6–13.1)

## 2021-08-14 NOTE — Progress Notes (Signed)
I Your INR is even lower than it was on sept 13.  Please  confirm your regimen and whether you have has missed any doses in the last week .  Tonight you need to take double  your usual  dose.   Regards,   Deborra Medina, MD

## 2021-08-15 ENCOUNTER — Ambulatory Visit
Admission: RE | Admit: 2021-08-15 | Discharge: 2021-08-15 | Disposition: A | Discharge status: Home / Self Care | Payer: Medicare Other | Source: Ambulatory Visit | Attending: Orthopedic Surgery | Admitting: Orthopedic Surgery

## 2021-08-15 ENCOUNTER — Other Ambulatory Visit: Payer: Self-pay

## 2021-08-15 DIAGNOSIS — M1712 Unilateral primary osteoarthritis, left knee: Secondary | ICD-10-CM | POA: Diagnosis not present

## 2021-08-15 DIAGNOSIS — M659 Synovitis and tenosynovitis, unspecified: Secondary | ICD-10-CM | POA: Diagnosis not present

## 2021-08-15 DIAGNOSIS — M25462 Effusion, left knee: Secondary | ICD-10-CM | POA: Diagnosis not present

## 2021-08-15 DIAGNOSIS — R600 Localized edema: Secondary | ICD-10-CM | POA: Diagnosis not present

## 2021-08-15 DIAGNOSIS — M7122 Synovial cyst of popliteal space [Baker], left knee: Secondary | ICD-10-CM | POA: Diagnosis not present

## 2021-08-21 DIAGNOSIS — M25462 Effusion, left knee: Secondary | ICD-10-CM | POA: Diagnosis not present

## 2021-08-22 ENCOUNTER — Other Ambulatory Visit (INDEPENDENT_AMBULATORY_CARE_PROVIDER_SITE_OTHER): Payer: Medicare Other

## 2021-08-22 ENCOUNTER — Other Ambulatory Visit: Payer: Self-pay

## 2021-08-22 DIAGNOSIS — Z7901 Long term (current) use of anticoagulants: Secondary | ICD-10-CM | POA: Diagnosis not present

## 2021-08-22 LAB — PROTIME-INR
INR: 2.3 ratio — ABNORMAL HIGH (ref 0.8–1.0)
Prothrombin Time: 24.4 s — ABNORMAL HIGH (ref 9.6–13.1)

## 2021-08-22 NOTE — Assessment & Plan Note (Signed)
INR is therapeutic on 7 mg daily  Lab Results  Component Value Date   INR 2.3 (H) 08/22/2021   INR 1.5 (H) 08/13/2021   INR 1.6 (H) 07/24/2021   y

## 2021-08-23 DIAGNOSIS — G8929 Other chronic pain: Secondary | ICD-10-CM | POA: Diagnosis not present

## 2021-08-23 DIAGNOSIS — M545 Low back pain, unspecified: Secondary | ICD-10-CM | POA: Diagnosis not present

## 2021-08-23 DIAGNOSIS — M5136 Other intervertebral disc degeneration, lumbar region: Secondary | ICD-10-CM | POA: Diagnosis not present

## 2021-08-23 DIAGNOSIS — M4807 Spinal stenosis, lumbosacral region: Secondary | ICD-10-CM | POA: Diagnosis not present

## 2021-08-23 DIAGNOSIS — M5441 Lumbago with sciatica, right side: Secondary | ICD-10-CM | POA: Diagnosis not present

## 2021-08-24 ENCOUNTER — Other Ambulatory Visit: Payer: Self-pay | Admitting: Orthopedic Surgery

## 2021-08-24 DIAGNOSIS — M5441 Lumbago with sciatica, right side: Secondary | ICD-10-CM

## 2021-08-24 DIAGNOSIS — G8929 Other chronic pain: Secondary | ICD-10-CM

## 2021-09-03 ENCOUNTER — Other Ambulatory Visit: Payer: Self-pay | Admitting: Internal Medicine

## 2021-09-03 DIAGNOSIS — S85402A Unspecified injury of lesser saphenous vein at lower leg level, left leg, initial encounter: Secondary | ICD-10-CM | POA: Diagnosis not present

## 2021-09-07 ENCOUNTER — Other Ambulatory Visit: Payer: Self-pay

## 2021-09-07 ENCOUNTER — Other Ambulatory Visit: Payer: Self-pay | Admitting: Surgery

## 2021-09-07 ENCOUNTER — Ambulatory Visit
Admission: RE | Admit: 2021-09-07 | Discharge: 2021-09-07 | Disposition: A | Discharge status: Home / Self Care | Payer: Medicare Other | Source: Ambulatory Visit | Attending: Orthopedic Surgery | Admitting: Orthopedic Surgery

## 2021-09-07 DIAGNOSIS — M5441 Lumbago with sciatica, right side: Secondary | ICD-10-CM | POA: Diagnosis not present

## 2021-09-07 DIAGNOSIS — M5126 Other intervertebral disc displacement, lumbar region: Secondary | ICD-10-CM | POA: Diagnosis not present

## 2021-09-07 DIAGNOSIS — G8929 Other chronic pain: Secondary | ICD-10-CM | POA: Diagnosis not present

## 2021-09-11 ENCOUNTER — Encounter
Admission: RE | Admit: 2021-09-11 | Discharge: 2021-09-11 | Disposition: A | Discharge status: Home / Self Care | Payer: Medicare Other | Source: Ambulatory Visit | Attending: Surgery | Admitting: Surgery

## 2021-09-11 ENCOUNTER — Other Ambulatory Visit: Payer: Self-pay

## 2021-09-11 VITALS — Ht 61.0 in | Wt 95.0 lb

## 2021-09-11 DIAGNOSIS — Z01812 Encounter for preprocedural laboratory examination: Secondary | ICD-10-CM

## 2021-09-11 DIAGNOSIS — I700 Atherosclerosis of aorta: Secondary | ICD-10-CM

## 2021-09-11 DIAGNOSIS — J449 Chronic obstructive pulmonary disease, unspecified: Secondary | ICD-10-CM

## 2021-09-11 DIAGNOSIS — Z7901 Long term (current) use of anticoagulants: Secondary | ICD-10-CM

## 2021-09-11 HISTORY — DX: Unspecified intestinal obstruction, unspecified as to partial versus complete obstruction: K56.609

## 2021-09-11 HISTORY — DX: Sleep apnea, unspecified: G47.30

## 2021-09-11 HISTORY — DX: Personal history of other diseases of the digestive system: Z87.19

## 2021-09-11 HISTORY — DX: Deficiency of other specified B group vitamins: E53.8

## 2021-09-11 HISTORY — DX: Atherosclerosis of aorta: I70.0

## 2021-09-11 HISTORY — DX: Carpal tunnel syndrome, unspecified upper limb: G56.00

## 2021-09-11 HISTORY — DX: Pneumonia, unspecified organism: J18.9

## 2021-09-11 NOTE — Patient Instructions (Addendum)
Your procedure is scheduled on: Tuesday, November 8 Report to the Registration Desk on the 1st floor of the [facility]. To find out your arrival time, please call [phone] between 1PM - 3PM on: Monday, November 7  REMEMBER: Instructions that are not followed completely may result in serious medical risk, up to and including death; or upon the discretion of your surgeon and anesthesiologist your surgery may need to be rescheduled.  Do not eat food after midnight the night before surgery.  No gum chewing, lozengers or hard candies.  You may however, drink CLEAR liquids up to 2 hours before you are scheduled to arrive for your surgery. Do not drink anything within 2 hours of your scheduled arrival time.  Clear liquids include: - water  - apple juice without pulp - gatorade (not RED, PURPLE, OR BLUE) - black coffee or tea (Do NOT add milk or creamers to the coffee or tea) Do NOT drink anything that is not on this list.  In addition, your doctor has ordered for you to drink the provided  Ensure Pre-Surgery Clear Carbohydrate Drink  Drinking this carbohydrate drink up to two hours before surgery helps to reduce insulin resistance and improve patient outcomes. Please complete drinking 2 hours prior to scheduled arrival time.  TAKE THESE MEDICATIONS THE MORNING OF SURGERY WITH A SIP OF WATER:  Advair diskus inhaler Omeprazole (Prilosec) - (take one the night before and one on the morning of surgery - helps to prevent nausea after surgery.) Spiriva inhaler  Use inhalers on the day of surgery and bring to the hospital.  Follow recommendations from Cardiologist, Pulmonologist or PCP regarding stopping Coumadin.  One week prior to surgery: Stop Anti-inflammatories (NSAIDS) such as Advil, Aleve, Ibuprofen, Motrin, Naproxen, Naprosyn and Aspirin based products such as Excedrin, Goodys Powder, BC Powder. Stop ANY OVER THE COUNTER supplements until after surgery. You may however, continue  to take Tylenol if needed for pain up until the day of surgery.  No Alcohol for 24 hours before or after surgery.  No Smoking including e-cigarettes for 24 hours prior to surgery.  No chewable tobacco products for at least 6 hours prior to surgery.  No nicotine patches on the day of surgery.  Do not use any "recreational" drugs for at least a week prior to your surgery.  Please be advised that the combination of cocaine and anesthesia may have negative outcomes, up to and including death. If you test positive for cocaine, your surgery will be cancelled.  On the morning of surgery brush your teeth with toothpaste and water, you may rinse your mouth with mouthwash if you wish. Do not swallow any toothpaste or mouthwash.  Use CHG Soap as directed on instruction sheet.  Do not wear jewelry, make-up, hairpins, clips or nail polish.  Do not wear lotions, powders, or perfumes.   Do not shave body from the neck down 48 hours prior to surgery just in case you cut yourself which could leave a site for infection.  Also, freshly shaved skin may become irritated if using the CHG soap.  Contact lenses, hearing aids and dentures may not be worn into surgery.  Do not bring valuables to the hospital. [facility] is not responsible for any missing/lost belongings or valuables.   Notify your doctor if there is any change in your medical condition (cold, fever, infection).  Wear comfortable clothing (specific to your surgery type) to the hospital.  After surgery, you can help prevent lung complications by doing  breathing exercises.  Take deep breaths and cough every 1-2 hours. Your doctor may order a device called an Incentive Spirometer to help you take deep breaths.  If you are being discharged the day of surgery, you will not be allowed to drive home. You will need a responsible adult (18 years or older) to drive you home and stay with you that night.   If you are taking public transportation,  you will need to have a responsible adult (18 years or older) with you. Please confirm with your physician that it is acceptable to use public transportation.   Please call the [place] Dept. at [phone] if you have any questions about these instructions.  Surgery Visitation Policy:  Patients undergoing a surgery or procedure may have one family member or support person with them as long as that person is not COVID-19 positive or experiencing its symptoms.  That person may remain in the waiting area during the procedure and may rotate out with other people.

## 2021-09-12 ENCOUNTER — Encounter: Payer: Self-pay | Admitting: Urgent Care

## 2021-09-12 ENCOUNTER — Telehealth: Payer: Self-pay | Admitting: Internal Medicine

## 2021-09-12 ENCOUNTER — Encounter
Admission: RE | Admit: 2021-09-12 | Discharge: 2021-09-12 | Disposition: A | Discharge status: Home / Self Care | Payer: Medicare Other | Source: Ambulatory Visit | Attending: Surgery | Admitting: Surgery

## 2021-09-12 ENCOUNTER — Other Ambulatory Visit: Payer: Self-pay | Admitting: Internal Medicine

## 2021-09-12 DIAGNOSIS — I700 Atherosclerosis of aorta: Secondary | ICD-10-CM | POA: Insufficient documentation

## 2021-09-12 DIAGNOSIS — Z7901 Long term (current) use of anticoagulants: Secondary | ICD-10-CM | POA: Diagnosis not present

## 2021-09-12 DIAGNOSIS — J449 Chronic obstructive pulmonary disease, unspecified: Secondary | ICD-10-CM | POA: Diagnosis not present

## 2021-09-12 DIAGNOSIS — Z01812 Encounter for preprocedural laboratory examination: Secondary | ICD-10-CM | POA: Insufficient documentation

## 2021-09-12 LAB — CBC
HCT: 40.9 % (ref 36.0–46.0)
Hemoglobin: 13.9 g/dL (ref 12.0–15.0)
MCH: 34.8 pg — ABNORMAL HIGH (ref 26.0–34.0)
MCHC: 34 g/dL (ref 30.0–36.0)
MCV: 102.5 fL — ABNORMAL HIGH (ref 80.0–100.0)
Platelets: 305 10*3/uL (ref 150–400)
RBC: 3.99 MIL/uL (ref 3.87–5.11)
RDW: 13.5 % (ref 11.5–15.5)
WBC: 9 10*3/uL (ref 4.0–10.5)
nRBC: 0 % (ref 0.0–0.2)

## 2021-09-12 LAB — BASIC METABOLIC PANEL
Anion gap: 6 (ref 5–15)
BUN: 12 mg/dL (ref 8–23)
CO2: 26 mmol/L (ref 22–32)
Calcium: 9.3 mg/dL (ref 8.9–10.3)
Chloride: 107 mmol/L (ref 98–111)
Creatinine, Ser: 0.71 mg/dL (ref 0.44–1.00)
GFR, Estimated: >60 mL/min (ref >60)
Glucose, Bld: 112 mg/dL — ABNORMAL HIGH (ref 70–99)
Potassium: 4.2 mmol/L (ref 3.5–5.1)
Sodium: 139 mmol/L (ref 135–145)

## 2021-09-12 MED ORDER — ENOXAPARIN SODIUM 40 MG/0.4ML IJ SOSY: 40.0000 mg | PREFILLED_SYRINGE | Freq: Two times a day (BID) | INTRAMUSCULAR | 0 refills | Status: DC

## 2021-09-12 NOTE — Telephone Encounter (Signed)
LMTCB

## 2021-09-12 NOTE — Progress Notes (Signed)
Return call from patient; explained medication recommendations that were received from Dr. Derrel Nip. Acknowledged understanding.

## 2021-09-12 NOTE — Progress Notes (Signed)
Call to Dr. Derrel Nip office at 8:10 am to inquire as to when patient can safely stop coumadin prior to surgery and see if she is to bridge with other medication. Office personnel took message and will await response.

## 2021-09-12 NOTE — Progress Notes (Signed)
Clearance form received from Dr. Derrel Nip; stop coumadin on November 3; restart on November 9. Patient to take Lovenox on Nov. 5,6,7.  Call to patient; left message to return call.

## 2021-09-12 NOTE — Telephone Encounter (Signed)
Form has been faxed.

## 2021-09-12 NOTE — Telephone Encounter (Signed)
Pt called back. Pt would like to know if she can pick up injections at the pharmacy. Advise Pt of direction for injection. Pt would like callback.

## 2021-09-12 NOTE — Telephone Encounter (Signed)
Form has been placed in red folder.  

## 2021-09-12 NOTE — Telephone Encounter (Signed)
Spoke with pt and informed her that the medication has been sent in to the pharmacy. Pt was given instructions on how to use and repeat all of them back with understanding. Also sent it to pt in mychart.

## 2021-09-12 NOTE — Telephone Encounter (Signed)
Tamara [name] called in from [facility] preadmission testing center stating that there was a fax sent over to Dr. Derrel Nip for Pt medication and labwork. Ms. [name] was wondering if Pt should stop medication (coumadin) before surgery. Ms. [name] would like to know if Dr. Derrel Nip wants to add any other blood draws to Pt lab sheet. Pt will be be arriving to preadmission test center at [place]. Per Tamara [name] would like for Dr. Derrel Nip to send fax backk to fax number [phone]. Per Tamara [name], if Dr. Derrel Nip have any question she can be reached at [phone]

## 2021-09-18 ENCOUNTER — Encounter: Payer: Self-pay | Admitting: Surgery

## 2021-09-18 ENCOUNTER — Ambulatory Visit: Payer: Medicare Other | Admitting: Urgent Care

## 2021-09-18 ENCOUNTER — Encounter
Admission: RE | Disposition: A | Discharge status: Home / Self Care | Payer: Self-pay | Source: Home / Self Care | Attending: Surgery

## 2021-09-18 ENCOUNTER — Other Ambulatory Visit: Payer: Self-pay

## 2021-09-18 ENCOUNTER — Ambulatory Visit
Admission: RE | Admit: 2021-09-18 | Discharge: 2021-09-18 | Disposition: A | Discharge status: Home / Self Care | Payer: Medicare Other | Attending: Surgery | Admitting: Surgery

## 2021-09-18 DIAGNOSIS — Y939 Activity, unspecified: Secondary | ICD-10-CM | POA: Insufficient documentation

## 2021-09-18 DIAGNOSIS — Z01812 Encounter for preprocedural laboratory examination: Secondary | ICD-10-CM

## 2021-09-18 DIAGNOSIS — Z7901 Long term (current) use of anticoagulants: Secondary | ICD-10-CM | POA: Diagnosis not present

## 2021-09-18 DIAGNOSIS — Z86718 Personal history of other venous thrombosis and embolism: Secondary | ICD-10-CM | POA: Diagnosis not present

## 2021-09-18 DIAGNOSIS — D3613 Benign neoplasm of peripheral nerves and autonomic nervous system of lower limb, including hip: Secondary | ICD-10-CM | POA: Insufficient documentation

## 2021-09-18 DIAGNOSIS — X58XXXA Exposure to other specified factors, initial encounter: Secondary | ICD-10-CM | POA: Insufficient documentation

## 2021-09-18 DIAGNOSIS — Z5181 Encounter for therapeutic drug level monitoring: Secondary | ICD-10-CM

## 2021-09-18 DIAGNOSIS — S85402A Unspecified injury of lesser saphenous vein at lower leg level, left leg, initial encounter: Secondary | ICD-10-CM | POA: Diagnosis not present

## 2021-09-18 DIAGNOSIS — T148XXA Other injury of unspecified body region, initial encounter: Secondary | ICD-10-CM | POA: Diagnosis not present

## 2021-09-18 DIAGNOSIS — Z87891 Personal history of nicotine dependence: Secondary | ICD-10-CM | POA: Diagnosis not present

## 2021-09-18 DIAGNOSIS — S86812A Strain of other muscle(s) and tendon(s) at lower leg level, left leg, initial encounter: Secondary | ICD-10-CM | POA: Insufficient documentation

## 2021-09-18 DIAGNOSIS — G4733 Obstructive sleep apnea (adult) (pediatric): Secondary | ICD-10-CM | POA: Diagnosis not present

## 2021-09-18 HISTORY — PX: EXCISION NEUROMA: SHX6350

## 2021-09-18 LAB — PROTIME-INR
INR: 1 (ref 0.8–1.2)
Prothrombin Time: 12.9 seconds (ref 11.4–15.2)

## 2021-09-18 SURGERY — EXCISION, NEUROMA
Anesthesia: General | Site: Knee | Laterality: Left

## 2021-09-18 MED ORDER — PROPOFOL 10 MG/ML IV BOLUS: INTRAVENOUS | Status: DC | PRN

## 2021-09-18 MED ORDER — ONDANSETRON HCL 4 MG/2ML IJ SOLN
INTRAMUSCULAR | Status: AC
  Filled 2021-09-18: qty 2

## 2021-09-18 MED ORDER — FENTANYL CITRATE (PF) 100 MCG/2ML IJ SOLN
INTRAMUSCULAR | Status: AC
  Filled 2021-09-18: qty 2

## 2021-09-18 MED ORDER — PROPOFOL 10 MG/ML IV BOLUS
INTRAVENOUS | Status: AC
  Filled 2021-09-18: qty 20

## 2021-09-18 MED ORDER — MIDAZOLAM HCL 2 MG/2ML IJ SOLN
INTRAMUSCULAR | Status: DC | PRN
  Administered 2021-09-18: 2 mg via INTRAVENOUS

## 2021-09-18 MED ORDER — CLONAZEPAM 0.5 MG PO TABS: 0.5000 mg | ORAL_TABLET | Freq: Every day | ORAL | Status: DC

## 2021-09-18 MED ORDER — LIDOCAINE HCL (CARDIAC) PF 100 MG/5ML IV SOSY
PREFILLED_SYRINGE | INTRAVENOUS | Status: DC | PRN
  Administered 2021-09-18: 60 mg via INTRAVENOUS

## 2021-09-18 MED ORDER — ORAL CARE MOUTH RINSE: 15.0000 mL | Freq: Once | OROMUCOSAL | Status: AC

## 2021-09-18 MED ORDER — OXYCODONE HCL 5 MG/5ML PO SOLN: 5.0000 mg | Freq: Once | ORAL | Status: DC | PRN

## 2021-09-18 MED ORDER — BUPIVACAINE HCL (PF) 0.5 % IJ SOLN
INTRAMUSCULAR | Status: DC | PRN
  Administered 2021-09-18: 10 mL

## 2021-09-18 MED ORDER — BUPIVACAINE HCL (PF) 0.5 % IJ SOLN
INTRAMUSCULAR | Status: AC
  Filled 2021-09-18: qty 30

## 2021-09-18 MED ORDER — FENTANYL CITRATE (PF) 100 MCG/2ML IJ SOLN: 25.0000 ug | INTRAMUSCULAR | Status: DC | PRN

## 2021-09-18 MED ORDER — ONDANSETRON HCL 4 MG/2ML IJ SOLN
INTRAMUSCULAR | Status: DC | PRN
  Administered 2021-09-18: 4 mg via INTRAVENOUS

## 2021-09-18 MED ORDER — ONDANSETRON HCL 4 MG PO TABS: 4.0000 mg | ORAL_TABLET | Freq: Four times a day (QID) | ORAL | Status: DC | PRN

## 2021-09-18 MED ORDER — FLUTICASONE-SALMETEROL 250-50 MCG/DOSE IN AEPB: 1.0000 | INHALATION_SPRAY | Freq: Two times a day (BID) | RESPIRATORY_TRACT | Status: DC | PRN

## 2021-09-18 MED ORDER — CEFAZOLIN SODIUM-DEXTROSE 2-4 GM/100ML-% IV SOLN
INTRAVENOUS | Status: AC
  Filled 2021-09-18: qty 100

## 2021-09-18 MED ORDER — ROSUVASTATIN CALCIUM 10 MG PO TABS: 10.0000 mg | ORAL_TABLET | Freq: Every day | ORAL | Status: DC

## 2021-09-18 MED ORDER — DEXAMETHASONE SODIUM PHOSPHATE 10 MG/ML IJ SOLN
INTRAMUSCULAR | Status: DC | PRN
  Administered 2021-09-18: 5 mg via INTRAVENOUS

## 2021-09-18 MED ORDER — SERTRALINE HCL 100 MG PO TABS: 100.0000 mg | ORAL_TABLET | Freq: Every day | ORAL | Status: DC

## 2021-09-18 MED ORDER — METOCLOPRAMIDE HCL 5 MG/ML IJ SOLN: 5.0000 mg | Freq: Three times a day (TID) | INTRAMUSCULAR | Status: DC | PRN

## 2021-09-18 MED ORDER — CHLORHEXIDINE GLUCONATE 0.12 % MT SOLN: 15.0000 mL | Freq: Once | OROMUCOSAL | Status: AC

## 2021-09-18 MED ORDER — NEOMYCIN-POLYMYXIN B GU 40-200000 IR SOLN
Status: AC
  Filled 2021-09-18: qty 2

## 2021-09-18 MED ORDER — PROPOFOL 10 MG/ML IV BOLUS
INTRAVENOUS | Status: DC | PRN
  Administered 2021-09-18: 120 mg via INTRAVENOUS

## 2021-09-18 MED ORDER — CHLORHEXIDINE GLUCONATE 0.12 % MT SOLN
OROMUCOSAL | Status: AC
  Administered 2021-09-18: 15 mL via OROMUCOSAL
  Filled 2021-09-18: qty 15

## 2021-09-18 MED ORDER — OXYCODONE HCL 5 MG PO TABS: 5.0000 mg | ORAL_TABLET | Freq: Once | ORAL | Status: DC | PRN

## 2021-09-18 MED ORDER — ONDANSETRON HCL 4 MG/2ML IJ SOLN
4.0000 mg | Freq: Four times a day (QID) | INTRAMUSCULAR | Status: DC | PRN
  Administered 2021-09-18: 4 mg via INTRAVENOUS

## 2021-09-18 MED ORDER — MIDAZOLAM HCL 2 MG/2ML IJ SOLN
INTRAMUSCULAR | Status: AC
  Filled 2021-09-18: qty 2

## 2021-09-18 MED ORDER — LACTATED RINGERS IV SOLN: INTRAVENOUS | Status: DC

## 2021-09-18 MED ORDER — TRAZODONE HCL 50 MG PO TABS: 100.0000 mg | ORAL_TABLET | Freq: Every day | ORAL | Status: DC

## 2021-09-18 MED ORDER — FENTANYL CITRATE (PF) 100 MCG/2ML IJ SOLN
INTRAMUSCULAR | Status: DC | PRN
  Administered 2021-09-18: 25 ug via INTRAVENOUS
  Administered 2021-09-18: 50 ug via INTRAVENOUS
  Administered 2021-09-18: 25 ug via INTRAVENOUS

## 2021-09-18 MED ORDER — HYDROCODONE-ACETAMINOPHEN 5-325 MG PO TABS
1.0000 | ORAL_TABLET | Freq: Four times a day (QID) | ORAL | Status: DC | PRN
  Administered 2021-09-18: 1 via ORAL

## 2021-09-18 MED ORDER — CEFAZOLIN SODIUM-DEXTROSE 2-4 GM/100ML-% IV SOLN
2.0000 g | INTRAVENOUS | Status: AC
  Administered 2021-09-18: 2 g via INTRAVENOUS

## 2021-09-18 MED ORDER — HYDROCODONE-ACETAMINOPHEN 5-325 MG PO TABS: 1.0000 | ORAL_TABLET | Freq: Four times a day (QID) | ORAL | 0 refills | Status: DC | PRN

## 2021-09-18 MED ORDER — SODIUM CHLORIDE 0.9 % IR SOLN
Status: DC | PRN
  Administered 2021-09-18: 100 mL

## 2021-09-18 MED ORDER — METOCLOPRAMIDE HCL 10 MG PO TABS: 5.0000 mg | ORAL_TABLET | Freq: Three times a day (TID) | ORAL | Status: DC | PRN

## 2021-09-18 MED ORDER — SPIRIVA HANDIHALER 18 MCG IN CAPS: 18.0000 ug | ORAL_CAPSULE | Freq: Every day | RESPIRATORY_TRACT | Status: DC | PRN

## 2021-09-18 MED ORDER — HYDROCODONE-ACETAMINOPHEN 5-325 MG PO TABS
ORAL_TABLET | ORAL | Status: AC
  Filled 2021-09-18: qty 1

## 2021-09-18 SURGICAL SUPPLY — 37 items
"PENCIL ELECTRO HAND CTR " (MISCELLANEOUS) ×1 IMPLANT
APL PRP STRL LF DISP 70% ISPRP (MISCELLANEOUS) ×1
BNDG CMPR STD VLCR NS LF 5.8X4 (GAUZE/BANDAGES/DRESSINGS) ×1
BNDG COHESIVE 4X5 TAN ST LF (GAUZE/BANDAGES/DRESSINGS) ×2 IMPLANT
BNDG ELASTIC 4X5.8 VLCR NS LF (GAUZE/BANDAGES/DRESSINGS) ×3 IMPLANT
BNDG ELASTIC 4X5.8 VLCR STR LF (GAUZE/BANDAGES/DRESSINGS) ×2 IMPLANT
BNDG ESMARK 4X12 TAN STRL LF (GAUZE/BANDAGES/DRESSINGS) ×3 IMPLANT
CHLORAPREP W/TINT 26 (MISCELLANEOUS) ×3 IMPLANT
CUFF TOURN SGL QUICK 24 (TOURNIQUET CUFF) ×3
CUFF TRNQT CYL 24X4X16.5-23 (TOURNIQUET CUFF) ×1 IMPLANT
DRAPE U-SHAPE 47X51 STRL (DRAPES) ×3 IMPLANT
ELECT REM PT RETURN 9FT ADLT (ELECTROSURGICAL) ×3
ELECTRODE REM PT RTRN 9FT ADLT (ELECTROSURGICAL) ×1 IMPLANT
GAUZE 4X4 16PLY ~~LOC~~+RFID DBL (SPONGE) ×3 IMPLANT
GAUZE SPONGE 4X4 12PLY STRL (GAUZE/BANDAGES/DRESSINGS) ×3 IMPLANT
GAUZE XEROFORM 1X8 LF (GAUZE/BANDAGES/DRESSINGS) ×5 IMPLANT
GLOVE SRG 8 PF TXTR STRL LF DI (GLOVE) ×1 IMPLANT
GLOVE SURG ENC MOIS LTX SZ8 (GLOVE) ×6 IMPLANT
GLOVE SURG UNDER POLY LF SZ8 (GLOVE) ×3
GOWN STRL REUS W/ TWL LRG LVL3 (GOWN DISPOSABLE) ×2 IMPLANT
GOWN STRL REUS W/TWL LRG LVL3 (GOWN DISPOSABLE) ×6
KIT TURNOVER KIT A (KITS) ×3 IMPLANT
LABEL OR SOLS (LABEL) ×3 IMPLANT
MANIFOLD NEPTUNE II (INSTRUMENTS) ×3 IMPLANT
NDL FILTER BLUNT 18X1 1/2 (NEEDLE) ×1 IMPLANT
NDL HYPO 25X1 1.5 SAFETY (NEEDLE) ×1 IMPLANT
NEEDLE FILTER BLUNT 18X 1/2SAF (NEEDLE) ×2
NEEDLE FILTER BLUNT 18X1 1/2 (NEEDLE) ×1 IMPLANT
NEEDLE HYPO 25X1 1.5 SAFETY (NEEDLE) ×3 IMPLANT
NS IRRIG 500ML POUR BTL (IV SOLUTION) ×3 IMPLANT
PACK EXTREMITY ARMC (MISCELLANEOUS) ×3 IMPLANT
PENCIL ELECTRO HAND CTR (MISCELLANEOUS) ×3 IMPLANT
STOCKINETTE M/LG 89821 (MISCELLANEOUS) ×2 IMPLANT
SUT PROLENE 4 0 PS 2 18 (SUTURE) ×4 IMPLANT
SUT VICRYL 3-0 27IN SH (SUTURE) ×2 IMPLANT
SYR 10ML LL (SYRINGE) ×3 IMPLANT
WATER STERILE IRR 500ML POUR (IV SOLUTION) ×3 IMPLANT

## 2021-09-18 NOTE — Anesthesia Postprocedure Evaluation (Signed)
Anesthesia Post Note  Patient: Tamara [name]  Procedure(s) Performed: Excision of traumatic neuroma infrapatellar branch saphenous nerve left knee. (Left: Knee)  Patient location during evaluation: PACU Anesthesia Type: General Level of consciousness: awake and alert Pain management: pain level controlled Vital Signs Assessment: post-procedure vital signs reviewed and stable Respiratory status: spontaneous breathing, nonlabored ventilation, respiratory function stable and patient connected to nasal cannula oxygen Cardiovascular status: blood pressure returned to baseline and stable Postop Assessment: no apparent nausea or vomiting Anesthetic complications: no   No notable events documented.   Last Vitals:  Vitals:   09/18/21 1830 09/18/21 1845  BP: 135/70 131/72  Pulse: 99 90  Resp: 16 14  Temp:    SpO2: 95% 97%    Last Pain:  Vitals:   09/18/21 1845  PainSc: 4                  Precious Haws Romell Wolden

## 2021-09-18 NOTE — H&P (Signed)
History of Present Illness:  Tamara [name] is a 75 y.o. female who is being referred by Dr. Leim Fabry a second opinion for persistent left knee pain following an arthroscopic debridement of the infrapatellar fat pad on 05/25/2021. The patient's postoperative course was complicated by the development of a DVT for which she is on Coumadin. The patient's postoperative course also was complicated by excessive bleeding from the medial portal which was treated by the placement of a stitch postoperatively in the office. The bleeding stopped, but the patient continues to note a burning pain across the anterior aspect of the knee. The symptoms have persisted despite an aspiration/injection, and several medications. The patient also notes a "bubble" in the anteromedial aspect of the knee which concerns her. Because of his continued left knee pain, she has begun to develop pain in the right hip region. These symptoms are being worked up presently by Dr. Posey Pronto and his physician assistant, Reche Dixon, PA-C. The patient denies any recent reinjury to the knee, but notes a numbness over the anterolateral aspect of the knee and proximal lower leg.  Current Outpatient Medications:  clonazePAM (KLONOPIN) 0.5 MG tablet Take 0.5 mg by mouth once daily.   dicyclomine (BENTYL) 10 mg capsule Take 1 capsule (10 mg total) by mouth 4 (four) times daily before meals and nightly AS NEEDED ONLY 120 capsule 11   esomeprazole (NEXIUM) 40 MG DR capsule Take 1 capsule (40 mg total) by mouth 2 (two) times daily Take 1 tablet 30 mins before breakfast and 1 tablet 30 mins before dinner. 60 capsule 2   fluticasone propion-salmeteroL (ADVAIR DISKUS) 250-50 mcg/dose diskus inhaler Inhale 1 inhalation into the lungs every 12 (twelve) hours   gabapentin (NEURONTIN) 100 MG capsule Patient takes 1 capsule at night on day 1. On day 2, patient takes 1 capsule in the morning and 1 at night. On day 3, patient takes 1 capsule in the morning, with  lunch, and at night. Patient is to continue 3 capsules daily from day 4 forward. 90 capsule 11   ibuprofen (MOTRIN) 800 MG tablet TAKE 1 TABLET BY MOUTH 3 TIMES DAILY WITH FOOD FOR 14 DAYS AS DIRECTED 60 tablet 1   nortriptyline (PAMELOR) 25 MG capsule Take 25 mg by mouth nightly   ondansetron (ZOFRAN-ODT) 4 MG disintegrating tablet Take 1 tablet (4 mg total) by mouth 4 (four) times daily as needed for Nausea. 30 tablet 2   rosuvastatin (CRESTOR) 10 MG tablet Take 10 mg by mouth once daily   sertraline (ZOLOFT) 100 MG tablet Take 100 mg by mouth once daily   sertraline (ZOLOFT) 50 MG tablet Take 50 mg by mouth once daily   tiotropium (SPIRIVA WITH HANDIHALER) 18 mcg inhalation capsule Place 18 mcg into inhaler and inhale once daily   traZODone (DESYREL) 50 MG tablet Take 100 mg by mouth nightly   warfarin (COUMADIN) 1 MG tablet TAKE WITH 5 MG TABLET DAILY (6 MG TOTAL)   warfarin (COUMADIN) 5 MG tablet Take 5 mg by mouth once daily   No current Epic-ordered facility-administered medications on file.   Allergies:   Eliquis [Apixaban] Nausea and Dizziness   Sulfa (Sulfonamide Antibiotics) Rash   Past Medical History:   Abdominal pain 10/18/2014  Last Assessment & Plan: Secondary to IBD and adhesions.   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 Refills given Lab Results Component Value Date VITAMINB12 263 01/26/2014   Anxiety state 05/06/2021  Last Assessment & Plan: Formatting of this note might be different from the original. secondary to son's recent AMI. She has been taking sertraline for years, 50 mg dose along with clonazepam for insomnia. Advised to increase zoloft to 100 mg daily   Aortic atherosclerosis (CMS-HCC) 07/19/2020  Last Assessment & Plan: Formatting of this note might be different from the original. Reviewed findings of prior CT scan today.. Patient is willing to Initiate statin therpay starting with 10  mg crestor once daily Last Assessment & Plan: Formatting of this note might be different from the original. Reviewed findings of prior CT scan today.. Patient is tolerating statin therapy with 10 mg cre   B12 deficiency 01/26/2014  Last Assessment & Plan: Formatting of this note is different from the original. Advised to increase oral intake to daily. Repeat level needed Lab Results Component Value Date VITAMINB12 290 04/15/2016 Last Assessment & Plan: Formatting of this note is different from the original. Recurrent due to patient nonadherence to monthly injection schedule. She has crohn's of the small intestine an   Carpal tunnel syndrome 03/08/2014  Last Assessment & Plan: S/p CT release sugery by Micheal Minz. Last month. Doing well post operatively   Cataracts, bilateral   Chest pain 05/11/2013  Last Assessment & Plan: Recurrent, Atypical. Multiple CRFs. EKG and stat cardiac enzymes were negative for ischemic changes. Refer to [facility] for further evaluation   Chronic migraine without aura 09/10/2014  Last Assessment & Plan: Workup for vascular cause done after last visit, Advised her to increase pamelor to 25 mg DAILY.   Cough 03/08/2014  Last Assessment & Plan: Lungs clear but given long term cough in smoker, will get CXR today.   Crohn's disease (CMS-HCC)  diagnosed 2005   Crohn's disease in remission (CMS-HCC) 04/28/2019   DVT (deep venous thrombosis) (CMS-HCC) 04/24/2018   Emphysema with chronic bronchitis , unspecified (CMS-HCC) 01/28/2020  Last Assessment & Plan: Formatting of this note might be different from the original. She is asymptomatic currently and have been prescribed maintenance doses of Spiriva and Symbicort   Encounter for preventive health examination 05/11/2013  Last Assessment & Plan: Sh eis at high risk for stroke and CAD . wellbutrin prescribed today after long discussio nof pros and cons of pharmacotherapy. Last Assessment & Plan: Formatting of this note is different from the  original. She declines mammograms due to pain of procedure. She [place] up to date on other screenings. age appropriate education and counseling updated, referrals for preventat   Extremity pain 03/17/2014   GERD (gastroesophageal reflux disease)   Gouty arthropathy 10/08/2014   Headache 03/17/2014   Hematochezia 09/07/2020   History of bone density study 06/01/2009   History of bone density study 03/24/2012   History of Clostridium difficile infection 05/24/2021   History of hepatic disease 10/18/2014  Overview: s/p laparotomy/ LOA 3/08, prior reversal of jejunal bypasss 2004   History of tobacco abuse 01/26/2014  Last Assessment & Plan: Formatting of this note might be different from the original. She stopped smoking Oct 2021 using Wellbutrin   Intractable chronic migraine without aura and without status migrainosus 05/20/2019   Low vitamin D level 05/01/2019   Malaise 03/08/2014  Last Assessment & Plan: With several other long term complaints. We made an appointment with her PCP, Dr. Derrel Nip for tomorrow to discuss these concerns.   Malnutrition (CMS-HCC) 02/20/2018  Last Assessment & Plan: Formatting of this note might be different from the original. I have reviewed her diet and recommended  that she increase her protein and fat intake while monitoring her carbohydrates.   Moderate tricuspid insufficiency 12/15/2014   Numbness 03/17/2014   Orthostatic hypotension 12/13/2014  Last Assessment & Plan: Symptomatic ,reviewed list of meds and not taking a diuretic. Her Am cortisol was normal today, Advised to increase hydration and stop prn use of spironolactone, whci she has NOT been taking, . Will consider referral to cardiology for POTS if persistent.   OSA (obstructive sleep apnea) 08/01/2019  Formatting of this note might be different from the original. Mild by home 2 night study Done in June 2020. autotitrating CPAP ordered Sept 21 2020 Last Assessment & Plan: Formatting of this note might be different from the  original. Diagnosed by prior sleep study. Patient is using CPAP every night a minimum of 6 hours per night and notes improved daytime wakefulness and decreased fatigue   Osteoporosis   Pain in both upper extremities 08/24/2015   Personal history of other diseases of the digestive system 02/20/2015  Overview: s/p laparotomy/ LOA 3/08, prior reversal of jejunal bypasss 2004   Personal history of venous thrombosis and embolism 01/10/2007  Overview: post operative right leg, s/p vena cava filter   Pharyngoesophageal dysphagia 09/07/2020   Primary osteoarthritis of left knee 06/24/2017   Reflux esophagitis 01/24/2018  Last Assessment & Plan: Formatting of this note might be different from the original. Managed with omeprazole 20 mg daily   Regional enteritis (CMS-HCC) 10/18/2014  Last Assessment & Plan: Managed by Dr. Vira Agar with mesalamine   Right lower quadrant abdominal pain 01/30/2021   S/P carpal tunnel release 05/18/2014   SBO (small bowel obstruction) (CMS-HCC) 2001 and 2004   Spinal stenosis, lumbar region, with neurogenic claudication 11/08/2016  Last Assessment & Plan: Formatting of this note might be different from the original. Confirmed by MRI with bilateral L4 nervie root impingement as well. Recommending neurosurgical referral.   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 Given her smoking (ongoing) and history of hiatal hernia repair, Her options appear to be limited to Reclast given failure of Prolia due to cost. She sees endocrinology next week. Reviewed calcium and vitamin D needs. Last Assessment & Plan: Formatting of this note might be different from the original. Her op   Tingling 03/01/2015   Tobacco abuse 03/23/2014   Underweight 08/27/2015  Last Assessment & Plan: Formatting of this note might be different from the original. Chronic, with slight improvement  following resolution of dysphagia with Nissen funduplication takedown Last Assessment & Plan: Formatting of this note might be different from the original. She refuses to regard er low body weight as pathologic and does not want to gai weight. She is not bulemic or anorexic.   Vision problems 03/17/2014   Vitamin D deficiency 08/27/2015  Last Assessment & Plan: Formatting of this note might be different from the original. Recurrent, Will continue weekly vitamin d 50,000 IUs Last Assessment & Plan: Formatting of this note is different from the original. With osteoporosis and recent fall and fracture. Continue current supplementation Last vitamin D Lab Results Component Value Date VD25OH 43.98 01/29/2021   Weight loss, abnormal 09/07/2020   Past Surgical History:   ANAL SPHINCTER PROSTHESIS PLACEMENT   APPENDECTOMY   BACK SURGERY 12/25/2016 (L4-L5 decompression)   CAPSULE ENDOSCOPY 05/02/2004   CAPSULE ENDOSCOPY 08/28/2005   CHOLECYSTECTOMY OPEN   COLONOSCOPY 04/25/2004 (Hyperplastic Polyp)   COLONOSCOPY  03/21/2014, 02/01/2009, 01/29/2006  [place] Crohn's Disease: CBF 03/2019: Recall ltr mailed   COLONOSCOPY 05/10/2019 ([place] Crohn's; Adenomatous Polyp: CBF 04/2024)   COLONOSCOPY 09/26/2020 (Negative colon biopsies/PHx Crohn's/Repeat 20yr/TKT)   EGD 09/01/2013, 02/07/2009, 01/05/2007, 05/06/2005, 03/17/1995  No repeat per RTE   EGD 07/22/2017 (No repeat per RTE)   EGD 09/26/2020 (Normal EGD biopsy/PHx GERD/REpeat 546yrTKT)   FUSION OF C6-7 ANTERIOR CERVICAL APPROACH 10/30/2004   HYSTERECTOMY VAGINAL   Left knee arthroscopy, partial medial menisectomy, chondroplasty of patellofemoral and medial compartments Left 06/22/2020 (Dr. PaPosey Pronto  Left knee arthroscopy, partial synovectomy with infrapatellar fat pad debridement, chondroplasty of patellofemoral and medial compartments Left 05/25/2021 (Dr. PaPosey Pronto  Left Long Finger Trigger Release 08/25/15 (Dr MeRudene Christians  Right Carpal Tunnel Release 04/22/2014   right  long finger trigger release Right 09/18/2015   SIGMOIDOSCOPY FLEXIBLE 05/22/1998   spleen removal ([place]   TONSILLECTOMY AND ADENOIDECTOMY   Family History:   Myocardial Infarction (Heart attack) Mother   Diabetes Mother   Osteoporosis (Thinning of bones) Mother   Rheum arthritis Mother   Myocardial Infarction (Heart attack) Father   Myocardial Infarction (Heart attack) Sister   Social History:   Socioeconomic History:   Marital status: Widowed  Tobacco Use   Smoking status: Former Smoker  Packs/day: 1.00  Years: 13.00  Pack years: 13.00  Types: Cigarettes  Quit date: 07/2020  Years since quitting: 1.1   Smokeless tobacco: Never Used  Vaping Use   Vaping Use: Never used  Substance and Sexual Activity   Alcohol use: No  Alcohol/week: 0.0 standard drinks   Drug use: Never   Sexual activity: Not Currently   Review of Systems:  A comprehensive 14 point ROS was performed, reviewed, and the pertinent orthopaedic findings are documented in the HPI.  Physical Exam: Vitals:  09/03/21 0924  BP: 104/60  Weight: (!) 43.2 kg (95 lb 3.2 oz)  Height: 160 cm (5' 3" )  PainSc: 8  PainLoc: Knee   General/Constitutional: The patient appears to be well-nourished, well-developed, and in no acute distress. Neuro/Psych: Normal mood and affect, oriented to person, place and time. Eyes: Non-icteric. Pupils are equal, round, and reactive to light, and exhibit synchronous movement. ENT: Unremarkable. Lymphatic: No palpable adenopathy. Respiratory: Lungs clear to auscultation, Normal chest excursion, No wheezes and Non-labored breathing Cardiovascular: Regular rate and rhythm. No murmurs. and No edema, swelling or tenderness, except as noted in detailed exam. Integumentary: No impressive skin lesions present, except as noted in detailed exam. Musculoskeletal: Unremarkable, except as noted in detailed exam.  Left knee exam: The  patient ambulates with an antalgic gait but does not use any assistive device. Skin inspection of the left knee is notable for well-healed arthroscopic portal sites, as well as a focal area of swelling over the anteromedial aspect of the knee, but otherwise is unremarkable. No swelling, erythema, ecchymosis, abrasions, or other skin abnormalities are identified. She has severe pain to even light touch over the anterior aspect of the knee in the area of the more medial of the medial portal site scars, as well as dysesthesias extending across the anterior aspect of the knee, consistent with a traumatic neuroma of the infrapatellar branch of the saphenous nerve. She is able to extend her knee fully and flex beyond 90 degrees with reproduction of her anterior knee pain. Her patella tracks well and is without crepitance. The knee is stable to varus and valgus stressing. There is decreased sensation  to light touch over the anterolateral aspect of the knee and lower leg, but otherwise, the patient is neurovascularly intact to the left lower extremity and foot.  X-rays/MRI/Lab data:  Recent standing AP and lateral x-rays of the left knee, as well as a sunrise view, are available for review and have been reviewed by myself. These films demonstrate mild degenerative changes involving the medial and lateral compartments as manifest by mild joint space narrowing and early osteophyte formation. Moderate chondrocalcinosis is noted with calcification of both the medial and lateral menisci. No lytic lesions or fractures are identified.  Assessment:  Traumatic neuroma of infrapatellar branch, left knee.  Plan: The treatment options were discussed with the patient. In addition, patient educational materials were provided regarding the diagnosis and treatment options. The patient is quite frustrated by her symptoms and functional limitations and would like to consider more aggressive treatment options. Based on the patient's  history and physical examination findings, I feel that she has sustained a traumatic neuroma to the infrapatellar branch of the saphenous nerve which is the primary contributor to her present symptoms. After discussion of these findings with Dr. Posey Pronto, he would like me to proceed with further treatment for this patient. Therefore, I have recommended a surgical procedure, specifically an excision of the traumatic neuroma. The procedure was discussed with the patient, as were the potential risks (including bleeding, infection, nerve and/or blood vessel injury, persistent or recurrent pain/dysesthesias, stiffness of the knee, need for further surgery, blood clots, strokes, heart attacks and/or arhythmias, pneumonia, etc.) and benefits. The patient states her understanding and wishes to proceed. All of the patient's questions and concerns were answered. She can call any time with further concerns. She will follow up post-surgery, routine.   H&P reviewed and patient re-examined. No changes.

## 2021-09-18 NOTE — Transfer of Care (Signed)
Immediate Anesthesia Transfer of Care Note  Patient: Tamara [name]  Procedure(s) Performed: Excision of traumatic neuroma infrapatellar branch saphenous nerve left knee. (Left: Knee)  Patient Location: PACU  Anesthesia Type:General  Level of Consciousness: sedated  Airway & Oxygen Therapy: Patient Spontanous Breathing and Patient connected to face mask oxygen  Post-op Assessment: Report given to RN and Post -op Vital signs reviewed and stable  Post vital signs: Reviewed and stable  Last Vitals:  Vitals Value Taken Time  BP    Temp    Pulse 96 09/18/21 1813  Resp 17 09/18/21 1813  SpO2 100 % 09/18/21 1813  Vitals shown include unvalidated device data.  Last Pain:  Vitals:   09/18/21 1442  PainSc: 8          Complications: No notable events documented.

## 2021-09-18 NOTE — Discharge Instructions (Addendum)
Orthopedic discharge instructions: Keep dressing dry and intact.  May shower after dressing changed on post-op day #4 (Saturday).  Cover sutures with Band-Aids after drying off. Apply ice frequently to knee. Take ibuprofen 600 mg TID with meals for 7-10 days, then as necessary. Take pain medication as prescribed or ES Tylenol when needed.  May weight-bear as tolerated - use crutches or walker as needed. Follow-up in 10-14 days or as scheduled.   AMBULATORY SURGERY  DISCHARGE INSTRUCTIONS   The drugs that you were given will stay in your system until tomorrow so for the next 24 hours you should not:  Drive an automobile Make any legal decisions Drink any alcoholic beverage   You may resume regular meals tomorrow.  Today it is better to start with liquids and gradually work up to solid foods.  You may eat anything you prefer, but it is better to start with liquids, then soup and crackers, and gradually work up to solid foods.   Please notify your doctor immediately if you have any unusual bleeding, trouble breathing, redness and pain at the surgery site, drainage, fever, or pain not relieved by medication.    Additional Instructions:        Please contact your physician with any problems or Same Day Surgery at [phone], Monday through Friday 6 am to 4 pm, or [place] at [facility] number at [phone].

## 2021-09-18 NOTE — Anesthesia Preprocedure Evaluation (Addendum)
Anesthesia Evaluation  Patient identified by MRN, date of birth, ID band Patient awake    Reviewed: Allergy & Precautions, NPO status , Patient's Chart, lab work & pertinent test results  History of Anesthesia Complications Negative for: history of anesthetic complications  Airway Mallampati: III  TM Distance: <3 FB Neck ROM: full    Dental  (+) Chipped, Poor Dentition, Missing   Pulmonary sleep apnea , COPD, Current Smoker and Patient abstained from smoking.,    Pulmonary exam normal        Cardiovascular Exercise Tolerance: Good (-) angina+ CAD  Normal cardiovascular exam     Neuro/Psych  Headaches, PSYCHIATRIC DISORDERS  Neuromuscular disease    GI/Hepatic Neg liver ROS, hiatal hernia, GERD  Medicated and Controlled,  Endo/Other  negative endocrine ROS  Renal/GU      Musculoskeletal  (+) Arthritis ,   Abdominal   Peds  Hematology negative hematology ROS (+)   Anesthesia Other Findings Past Medical History: No date: Aortic atherosclerosis (HCC) No date: Arthritis No date: B12 deficiency No date: Bowel obstruction ([place])     Comment:  s/p laparotomy/ LOA 3/08, prior reversal of jejunal               bypasss 2004 No date: Carpal tunnel syndrome No date: Chronic abdominal pain 05/24/2021: Clostridium difficile infection No date: COPD (chronic obstructive pulmonary disease) ([place])     Comment:  no inhalers No date: Crohn's disease ([place])     Comment:  remission 2016 No date: Depression 04/24/2018: DVT (deep venous thrombosis) ([place]) 05/2021: DVT (deep venous thrombosis) (HCC)     Comment:  left leg 12/04/2017: Dysphagia 07/11/2018: Facial twitching No date: GERD (gastroesophageal reflux disease) No date: Headache 01/2007: History of DVT (deep vein thrombosis)     Comment:  post operative right leg, s/p vena cava filter No date: History of hiatal hernia No date: History of kidney stones 05/20/2019:  Intractable chronic migraine without aura and without  status migrainosus No date: Leukocytosis No date: Leukocytosis No date: Osteoporosis No date: Pneumonia No date: Sleep apnea No date: Small bowel obstruction ([place])     Comment:  2001, 2004  Past Surgical History: No date: ANAL SPHINCTER PROSTHESIS PLACEMENT No date: APPENDECTOMY 04/22/2014: CARPAL TUNNEL RELEASE; Right No date: CATARACT EXTRACTION 2005: CERVICAL FUSION     Comment:  C6-7, anterior approach No date: CHOLECYSTECTOMY 2015: COLONOSCOPY No date: COLONOSCOPY     Comment:  2005, 2007, 2010, 2015, 2020, 2021 05/10/2019: COLONOSCOPY WITH PROPOFOL; N/A     Comment:  Procedure: COLONOSCOPY WITH PROPOFOL;  Surgeon: Manya Silvas, MD;  Location: [facility] ENDOSCOPY;  Service:               Endoscopy;  Laterality: N/A; 02/11/2018: ESOPHAGEAL MANOMETRY; N/A     Comment:  Procedure: ESOPHAGEAL MANOMETRY (EM);  Surgeon: Lucilla Lame, MD;  Location: [facility] ENDOSCOPY;  Service:               Endoscopy;  Laterality: N/A; No date: ESOPHAGOGASTRODUODENOSCOPY     Comment:  1996, 2006, 2008, 2010, 2014, 2018, 2021 12/17/2017: ESOPHAGOGASTRODUODENOSCOPY (EGD) WITH PROPOFOL; N/A     Comment:  Procedure: ESOPHAGOGASTRODUODENOSCOPY (EGD) WITH               PROPOFOL;  Surgeon: Robert Bellow, MD;  Location:  [facility] ENDOSCOPY;  Service: Endoscopy;  Laterality: N/A; 01/08/2018: ESOPHAGOGASTRODUODENOSCOPY (EGD) WITH PROPOFOL; N/A     Comment:  Procedure: ESOPHAGOGASTRODUODENOSCOPY (EGD) WITH               PROPOFOL;  Surgeon: Robert Bellow, MD;  Location:               [facility] ENDOSCOPY;  Service: Endoscopy;  Laterality: N/A; No date: gyn surgery     Comment:  hysterectomy No date: INSERTION OF VENA CAVA FILTER; Right 06/22/2020: KNEE ARTHROSCOPY WITH MEDIAL MENISECTOMY; Left     Comment:  Procedure: Left knee arthroscopic partial medial               meniscectomy;  Surgeon: Leim Fabry, MD;   Location:               [place];  Service: Orthopedics;  Laterality:               Left; 05/25/2021: KNEE ARTHROSCOPY WITH MEDIAL MENISECTOMY; Left     Comment:  Procedure: Left knee arthroscopy, infrapatellar fat pad               excision;  Surgeon: Leim Fabry, MD;  Location: [facility]               ORS;  Service: Orthopedics;  Laterality: Left; 01/30/2007: LAPAROTOMY     Comment:  for bowel obstruction with LOA 12/25/2016: LUMBAR LAMINECTOMY/DECOMPRESSION MICRODISCECTOMY; N/A     Comment:  Procedure: LUMBAR DECOMPRESSION L4-5;  Surgeon: Meade Maw, MD;  Location: [facility] ORS;  Service:               Neurosurgery;  Laterality: N/A; No date: NISSEN FUNDOPLICATION No date: ROTATOR CUFF REPAIR; Bilateral No date: SPLENECTOMY     Comment:  secondary to colonoscopy No date: TONSILLECTOMY AND ADENOIDECTOMY 08/25/2015: TRIGGER FINGER RELEASE; Left 09/18/2015: TRIGGER FINGER RELEASE; Right No date: VAGINAL HYSTERECTOMY  BMI    Body Mass Index: 17.95 kg/m      Reproductive/Obstetrics negative OB ROS                             Anesthesia Physical Anesthesia Plan  ASA: 3  Anesthesia Plan: General LMA   Post-op Pain Management:    Induction: Intravenous  PONV Risk Score and Plan: Dexamethasone, Ondansetron, Midazolam and Treatment may vary due to age or medical condition  Airway Management Planned: LMA  Additional Equipment:   Intra-op Plan:   Post-operative Plan: Extubation in OR  Informed Consent: I have reviewed the patients History and Physical, chart, labs and discussed the procedure including the risks, benefits and alternatives for the proposed anesthesia with the patient or authorized representative who has indicated his/her understanding and acceptance.     Dental Advisory Given  Plan Discussed with: Anesthesiologist, CRNA and Surgeon  Anesthesia Plan Comments: (Patient consented for risks of anesthesia  including but not limited to:  - adverse reactions to medications - damage to eyes, teeth, lips or other oral mucosa - nerve damage due to positioning  - sore throat or hoarseness - Damage to heart, brain, nerves, lungs, other parts of body or loss of life  Patient voiced understanding.)       Anesthesia Quick Evaluation

## 2021-09-18 NOTE — Op Note (Signed)
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  Post-Op Diagnosis:   Same with medial retinacular defect, left knee.  Procedure:   Excision and reimplantation of traumatic neuroma of infrapatellar branch of saphenous nerve, repair of medial retinacular defect, left knee.  Surgeon:   Pascal Lux, MD  Assistant:   Douglass Rivers, PA-S  Anesthesia:   General LMA  Findings:   As above.  Complications:   None  Fluids:   700 cc crystalloid  EBL:   0 cc  UOP:   None  TT:   40 minutes at 300 mmHg  Drains:   None  Closure:   4-0 Prolene interrupted sutures  Brief Clinical Note:   The patient is a 75 year old female who has had significant anterior knee pain following two arthroscopic procedures, the last of which was done 4 months ago.  Her symptoms have persisted despite medications, activity modification, etc.  History and examination consistent with a traumatic neuroma involving the infrapatellar branch of the saphenous nerve.  She also demonstrates a bulbous fluid-filled mass in the anteromedial aspect the knee which develops with knee flexion, consistent with a medial retinacular defect.  The patient presents at this time for an excision of the traumatic neuroma with repair of the medial retinacular defect of her left knee.  Procedure:   The patient was brought into the operating room and lain in the supine position.  After adequate general laryngeal mask anesthesia was obtained, the patient's left lower extremity was prepped with ChloraPrep solution before being draped sterilely.  Preoperative antibiotics were administered.  A timeout was performed to verify the appropriate surgical site before the limb was exsanguinated with an Esmarch and the tourniquet inflated to 300 mmHg.  An oblique incision was made over the medial aspect of the knee, incorporating the medial portal site  which was her primary site of pain.  The incision was carried down through the subcutaneous tissues to expose the retinaculum.  Careful dissection expose the infrapatellar branch of the saphenous nerve.  This was followed anteriorly and the nerve was noted to end at the anteromedial portal site and a wad of scar, consistent with a traumatic neuroma.  This area was excised and the nerve was cut fresh approximately 1 cm proximal to this site.  The nerve was dissected back to provide sufficient mobilization before it was rerouted proximally and implanted into the vastus medialis muscle.  The fascia overlying the vastus medialis was closed over the nerve with a 3-0 Vicryl interrupted suture to keep the nerve in place.   The medial retinacular defect was identified at the anteromedial portal site.  This was closed using #0 Vicryl interrupted sutures.  The subcutaneous tissues were closed using 3-0 Vicryl interrupted sutures before the skin was closed using 4-0 Prolene interrupted sutures.  A total of 10 cc of 0.5% plain Sensorcaine was injected in and around the incision site to help with postoperative analgesia before sterile bulky dressing was applied to the knee.  The patient was then awakened, extubated, and returned to the recovery room in satisfactory condition after tolerating the procedure well.

## 2021-09-19 ENCOUNTER — Encounter: Payer: Self-pay | Admitting: Surgery

## 2021-09-20 LAB — SURGICAL PATHOLOGY

## 2021-09-25 DIAGNOSIS — Z7901 Long term (current) use of anticoagulants: Secondary | ICD-10-CM | POA: Diagnosis not present

## 2021-09-25 DIAGNOSIS — M5441 Lumbago with sciatica, right side: Secondary | ICD-10-CM | POA: Diagnosis not present

## 2021-09-25 DIAGNOSIS — G8929 Other chronic pain: Secondary | ICD-10-CM | POA: Diagnosis not present

## 2021-09-25 DIAGNOSIS — M48062 Spinal stenosis, lumbar region with neurogenic claudication: Secondary | ICD-10-CM | POA: Diagnosis not present

## 2021-09-26 DIAGNOSIS — Z7901 Long term (current) use of anticoagulants: Secondary | ICD-10-CM | POA: Diagnosis not present

## 2021-09-26 DIAGNOSIS — M5441 Lumbago with sciatica, right side: Secondary | ICD-10-CM | POA: Diagnosis not present

## 2021-09-27 DIAGNOSIS — M48062 Spinal stenosis, lumbar region with neurogenic claudication: Secondary | ICD-10-CM | POA: Diagnosis not present

## 2021-10-02 ENCOUNTER — Other Ambulatory Visit: Payer: Self-pay | Admitting: Internal Medicine

## 2021-10-10 DIAGNOSIS — M5441 Lumbago with sciatica, right side: Secondary | ICD-10-CM | POA: Diagnosis not present

## 2021-10-10 DIAGNOSIS — G8929 Other chronic pain: Secondary | ICD-10-CM | POA: Diagnosis not present

## 2021-10-10 DIAGNOSIS — M48062 Spinal stenosis, lumbar region with neurogenic claudication: Secondary | ICD-10-CM | POA: Diagnosis not present

## 2021-10-16 DIAGNOSIS — M5416 Radiculopathy, lumbar region: Secondary | ICD-10-CM | POA: Diagnosis not present

## 2021-10-22 ENCOUNTER — Other Ambulatory Visit: Payer: Self-pay | Admitting: Internal Medicine

## 2021-10-24 DIAGNOSIS — M5416 Radiculopathy, lumbar region: Secondary | ICD-10-CM | POA: Diagnosis not present

## 2021-10-24 DIAGNOSIS — M6281 Muscle weakness (generalized): Secondary | ICD-10-CM | POA: Diagnosis not present

## 2021-10-25 ENCOUNTER — Other Ambulatory Visit: Payer: Self-pay | Admitting: Internal Medicine

## 2021-10-29 DIAGNOSIS — M5416 Radiculopathy, lumbar region: Secondary | ICD-10-CM | POA: Diagnosis not present

## 2021-10-29 DIAGNOSIS — M6281 Muscle weakness (generalized): Secondary | ICD-10-CM | POA: Diagnosis not present

## 2021-10-31 ENCOUNTER — Other Ambulatory Visit: Payer: Self-pay | Admitting: Neurosurgery

## 2021-10-31 DIAGNOSIS — Z01818 Encounter for other preprocedural examination: Secondary | ICD-10-CM

## 2021-11-02 ENCOUNTER — Other Ambulatory Visit: Payer: Self-pay

## 2021-11-02 ENCOUNTER — Encounter
Admission: RE | Admit: 2021-11-02 | Discharge: 2021-11-02 | Disposition: A | Discharge status: Home / Self Care | Payer: Medicare Other | Source: Ambulatory Visit | Attending: Neurosurgery | Admitting: Neurosurgery

## 2021-11-02 VITALS — BP 119/66 | HR 106 | Resp 14 | Ht 61.0 in | Wt 96.0 lb

## 2021-11-02 DIAGNOSIS — R8290 Unspecified abnormal findings in urine: Secondary | ICD-10-CM

## 2021-11-02 DIAGNOSIS — Z01812 Encounter for preprocedural laboratory examination: Secondary | ICD-10-CM | POA: Insufficient documentation

## 2021-11-02 DIAGNOSIS — Z79899 Other long term (current) drug therapy: Secondary | ICD-10-CM | POA: Insufficient documentation

## 2021-11-02 LAB — BASIC METABOLIC PANEL
Anion gap: 7 (ref 5–15)
BUN: 9 mg/dL (ref 8–23)
CO2: 24 mmol/L (ref 22–32)
Calcium: 9.3 mg/dL (ref 8.9–10.3)
Chloride: 104 mmol/L (ref 98–111)
Creatinine, Ser: 0.6 mg/dL (ref 0.44–1.00)
GFR, Estimated: >60 mL/min (ref >60)
Glucose, Bld: 142 mg/dL — ABNORMAL HIGH (ref 70–99)
Potassium: 3.6 mmol/L (ref 3.5–5.1)
Sodium: 135 mmol/L (ref 135–145)

## 2021-11-02 LAB — TYPE AND SCREEN
ABO/RH(D): O POS
Antibody Screen: NEGATIVE

## 2021-11-02 LAB — URINALYSIS, ROUTINE W REFLEX MICROSCOPIC
Bilirubin Urine: NEGATIVE
Glucose, UA: NEGATIVE mg/dL
Hgb urine dipstick: NEGATIVE
Ketones, ur: NEGATIVE mg/dL
Nitrite: POSITIVE — AB
Protein, ur: NEGATIVE mg/dL
Specific Gravity, Urine: <1.005 — ABNORMAL LOW (ref 1.005–1.030)
pH: 5 (ref 5.0–8.0)

## 2021-11-02 LAB — URINALYSIS, MICROSCOPIC (REFLEX)

## 2021-11-02 LAB — CBC
HCT: 40.1 % (ref 36.0–46.0)
Hemoglobin: 13.5 g/dL (ref 12.0–15.0)
MCH: 34 pg (ref 26.0–34.0)
MCHC: 33.7 g/dL (ref 30.0–36.0)
MCV: 101 fL — ABNORMAL HIGH (ref 80.0–100.0)
Platelets: 270 10*3/uL (ref 150–400)
RBC: 3.97 MIL/uL (ref 3.87–5.11)
RDW: 14.4 % (ref 11.5–15.5)
WBC: 10.7 10*3/uL — ABNORMAL HIGH (ref 4.0–10.5)
nRBC: 0 % (ref 0.0–0.2)

## 2021-11-02 LAB — APTT: aPTT: 31 seconds (ref 24–36)

## 2021-11-02 LAB — PROTIME-INR
INR: 1.1 (ref 0.8–1.2)
Prothrombin Time: 14 seconds (ref 11.4–15.2)

## 2021-11-02 LAB — SURGICAL PCR SCREEN
MRSA, PCR: NEGATIVE
Staphylococcus aureus: NEGATIVE

## 2021-11-02 NOTE — Patient Instructions (Addendum)
Your procedure is scheduled on: 11/06/21 Report to [place]. To find out your arrival time please call [phone] between 1PM - 3PM on 11/05/21.  Remember: Instructions that are not followed completely may result in serious medical risk, up to and including death, or upon the discretion of your surgeon and anesthesiologist your surgery may need to be rescheduled.     _X__ 1. Do not eat food after midnight the night before your procedure.                 No gum chewing or hard candies. You may drink clear liquids up to 2 hours                 before you are scheduled to arrive for your surgery- DO not drink clear                 liquids within 2 hours of the start of your surgery.                 Clear Liquids include:  water, apple juice without pulp, clear carbohydrate                 drink such as Clearfast or Gatorade, Black Coffee or Tea (Do not add                 anything to coffee or tea). Diabetics water only  __X__2.  On the morning of surgery brush your teeth with toothpaste and water, you                 may rinse your mouth with mouthwash if you wish.  Do not swallow any              toothpaste of mouthwash.     _X__ 3.  No Alcohol for 24 hours before or after surgery.   _X__ 4.  Do Not Smoke or use e-cigarettes For 24 Hours Prior to Your Surgery.                 Do not use any chewable tobacco products for at least 6 hours prior to                 surgery.  ____  5.  Bring all medications with you on the day of surgery if instructed.   __X__  6.  Notify your doctor if there is any change in your medical condition      (cold, fever, infections).     Do not wear jewelry, make-up, hairpins, clips or nail polish. Do not wear lotions, powders, or perfumes.  Do not shave body hair 48 hours prior to surgery. Men may shave face and neck. Do not bring valuables to the hospital.    [facility] is not responsible for any  belongings or valuables.  Contacts, dentures/partials or body piercings may not be worn into surgery. Bring a case for your contacts, glasses or hearing aids, a denture cup will be supplied. Leave your suitcase in the car. After surgery it may be brought to your room. For patients admitted to the hospital, discharge time is determined by your treatment team.   Patients discharged the day of surgery will not be allowed to drive home.   Please read over the following fact sheets that you were given:   MRSA Information, CHG soap  __X__ Take these medicines the morning of surgery with A SIP  OF WATER:    1. omeprazole (PRILOSEC) 40 MG capsule  2.   3.   4.  5.  6.  ____ Fleet Enema (as directed)   __X__ Use CHG Soap/SAGE wipes as directed  ____ Use inhalers on the day of surgery  ____ Stop metformin/Janumet/Farxiga 2 days prior to surgery    ____ Take 1/2 of usual insulin dose the night before surgery. No insulin the morning          of surgery.   __X__ Stop Blood Thinners Coumadin/Plavix/Xarelto/Pleta/Pradaxa/Eliquis/Effient/Aspirin  on   Or contact your Surgeon, Cardiologist or Medical Doctor regarding  ability to stop your blood thinners  HOLD YOUR COUMADIN AS INSTRUCTED BY DR Cari Caraway.  __X__ Stop Anti-inflammatories 7 days before surgery such as Advil, Ibuprofen, Motrin,  BC or Goodies Powder, Naprosyn, Naproxen, Aleve, Aspirin   You may take Tylenol if needed  __X__ Stop all herbal supplements, fish oil or vitamins  until after surgery.    ____ Bring C-Pap to the hospital.

## 2021-11-03 DIAGNOSIS — Z01812 Encounter for preprocedural laboratory examination: Secondary | ICD-10-CM

## 2021-11-03 DIAGNOSIS — N390 Urinary tract infection, site not specified: Secondary | ICD-10-CM

## 2021-11-04 LAB — URINE CULTURE: Culture: >=100000 — AB

## 2021-11-05 MED ORDER — CIPROFLOXACIN HCL 500 MG PO TABS: 500.0000 mg | ORAL_TABLET | Freq: Two times a day (BID) | ORAL | 0 refills | Status: AC

## 2021-11-05 NOTE — Progress Notes (Signed)
[facility] Perioperative Services: Pre-Admission/Anesthesia Testing  Abnormal Lab Notification and Treatment Plan of Care   Date: 11/05/21  Name: Tamara [name] MRN:   [id number]  Re: Abnormal labs noted during PAT appointment   Notified:  Provider Name Provider Role Notification Mode  Meade Maw, MD Neurosurgery Routed and/or faxed via [facility]   Abnormal Lab Value(s):   Lab Results  Component Value Date   [place] 11/02/2021   APPEARANCEUR CLEAR 11/02/2021   LABSPEC <1.005 (L) 11/02/2021   PHURINE 5.0 11/02/2021   GLUCOSEU NEGATIVE 11/02/2021   HGBUR NEGATIVE 11/02/2021   BILIRUBINUR NEGATIVE 11/02/2021   KETONESUR NEGATIVE 11/02/2021   PROTEINUR NEGATIVE 11/02/2021   UROBILINOGEN 0.2 03/22/2016   NITRITE POSITIVE (A) 11/02/2021   LEUKOCYTESUR TRACE (A) 11/02/2021   EPIU 0-5 11/02/2021   WBCU 0-5 11/02/2021   RBCU 0-5 11/02/2021   BACTERIA RARE (A) 11/02/2021   CULT >=100,000 COLONIES/mL ESCHERICHIA COLI (A) 11/02/2021    Clinical Information and Notes:  Patient is scheduled for RIGHT L4-5 FAR LATERAL DISCECTOMY on 11/06/2021.    UA performed in PAT consistent with/concerning for infection.  No leukocytosis noted on CBC; WBC 10,700 Renal function: Estimated Creatinine Clearance: 42.4 mL/min (by C-G formula based on SCr of 0.6 mg/dL). Urine C&S added to assess for pathogenically significant growth.  Impression and Plan:  Tamara [name] with a UA that was (+) for infection; reflex culture sent. Contacted patient to discuss. Had difficulty getting in contact with the patient presumably due to the holiday weekend. Patient reporting that she is not experiencing any significant symptoms. Urine with pathogenically significant bacterial isolate. Sample nitrite (+). Suspect early infection. Patient with surgery scheduled for tomorrow. In efforts to avoid delaying patient's procedure, or have her experience any potentially significant  perioperative complications related to the aforementioned, I would like to proceed with empiric treatment for urinary tract infection. She will have time to get at least one dose in prior to surgery. Additionally, patient to receive IV cefazolin prior to surgery, which will also help clear her infection.   Allergies reviewed. Culture report also reviewed to ensure culture appropriate coverage is being provided. Will treat with a 3 day course of Cipro. She is currently off of her warfarin therapy, therefore the chose therapy should have no safety implications. Patient encouraged to complete the entire course of antibiotics.   Meds ordered this encounter  Medications   ciprofloxacin (CIPRO) 500 MG tablet    Sig: Take 1 tablet (500 mg total) by mouth 2 (two) times daily for 3 days. Increase WATER intake while taking this medication.    Dispense:  6 tablet    Refill:  0   Patient encouraged to increase her fluid intake as much as possible. Discussed that water is always best to flush the urinary tract. She was advised to avoid caffeine containing fluids until her infections clears, as caffeine can cause her to experience painful bladder spasms.   May use Tylenol as needed for pain/fever should she experience these symptoms.    Patient instructed to call surgeon's office or PAT with any questions or concerns related to the above outlined course of treatment. Additionally, she was instructed to call if she feels like she is getting worse overall while on treatment. Results and treatment plan of care forwarded to primary attending surgeon to make them aware.   Honor Loh, MSN, APRN, FNP-C, CEN [facility]  Peri-operative Services Nurse Practitioner Phone: [phone] Fax: [phone]  11/05/21 3:19 PM

## 2021-11-06 ENCOUNTER — Ambulatory Visit: Payer: Medicare Other | Admitting: Urgent Care

## 2021-11-06 ENCOUNTER — Other Ambulatory Visit: Payer: Self-pay

## 2021-11-06 ENCOUNTER — Ambulatory Visit
Admission: RE | Admit: 2021-11-06 | Discharge: 2021-11-06 | Disposition: A | Discharge status: Home / Self Care | Payer: Medicare Other | Attending: Neurosurgery | Admitting: Neurosurgery

## 2021-11-06 ENCOUNTER — Encounter
Admission: RE | Disposition: A | Discharge status: Home / Self Care | Payer: Self-pay | Source: Home / Self Care | Attending: Neurosurgery

## 2021-11-06 ENCOUNTER — Encounter: Payer: Self-pay | Admitting: Neurosurgery

## 2021-11-06 ENCOUNTER — Ambulatory Visit: Payer: Medicare Other

## 2021-11-06 DIAGNOSIS — Z981 Arthrodesis status: Secondary | ICD-10-CM | POA: Diagnosis not present

## 2021-11-06 DIAGNOSIS — I700 Atherosclerosis of aorta: Secondary | ICD-10-CM | POA: Insufficient documentation

## 2021-11-06 DIAGNOSIS — M5116 Intervertebral disc disorders with radiculopathy, lumbar region: Secondary | ICD-10-CM | POA: Insufficient documentation

## 2021-11-06 DIAGNOSIS — M5416 Radiculopathy, lumbar region: Secondary | ICD-10-CM | POA: Diagnosis not present

## 2021-11-06 DIAGNOSIS — Z79899 Other long term (current) drug therapy: Secondary | ICD-10-CM | POA: Insufficient documentation

## 2021-11-06 DIAGNOSIS — K219 Gastro-esophageal reflux disease without esophagitis: Secondary | ICD-10-CM | POA: Insufficient documentation

## 2021-11-06 DIAGNOSIS — Z419 Encounter for procedure for purposes other than remedying health state, unspecified: Secondary | ICD-10-CM

## 2021-11-06 DIAGNOSIS — J449 Chronic obstructive pulmonary disease, unspecified: Secondary | ICD-10-CM | POA: Insufficient documentation

## 2021-11-06 DIAGNOSIS — Z86718 Personal history of other venous thrombosis and embolism: Secondary | ICD-10-CM | POA: Diagnosis not present

## 2021-11-06 HISTORY — PX: LUMBAR, FAR LATERAL DISCECTOMY: SHX7449

## 2021-11-06 SURGERY — FAR LATERAL DECOMPRESSION 1 LEVEL
Anesthesia: General | Site: Spine Lumbar | Laterality: Right

## 2021-11-06 MED ORDER — LIDOCAINE HCL (PF) 2 % IJ SOLN
INTRAMUSCULAR | Status: AC
  Filled 2021-11-06: qty 5

## 2021-11-06 MED ORDER — ONDANSETRON HCL 4 MG/2ML IJ SOLN
INTRAMUSCULAR | Status: AC
  Filled 2021-11-06: qty 2

## 2021-11-06 MED ORDER — ROCURONIUM BROMIDE 10 MG/ML (PF) SYRINGE
PREFILLED_SYRINGE | INTRAVENOUS | Status: AC
  Filled 2021-11-06: qty 10

## 2021-11-06 MED ORDER — SODIUM CHLORIDE (PF) 0.9 % IJ SOLN
INTRAMUSCULAR | Status: DC | PRN
  Administered 2021-11-06: 60 mL via INTRAMUSCULAR

## 2021-11-06 MED ORDER — ORAL CARE MOUTH RINSE: 15.0000 mL | Freq: Once | OROMUCOSAL | Status: DC

## 2021-11-06 MED ORDER — BUPIVACAINE HCL (PF) 0.5 % IJ SOLN
INTRAMUSCULAR | Status: AC
  Filled 2021-11-06: qty 30

## 2021-11-06 MED ORDER — CEFAZOLIN SODIUM-DEXTROSE 2-3 GM-%(50ML) IV SOLR
INTRAVENOUS | Status: DC | PRN
  Administered 2021-11-06: 2 g via INTRAVENOUS

## 2021-11-06 MED ORDER — PHENYLEPHRINE HCL-NACL 20-0.9 MG/250ML-% IV SOLN
INTRAVENOUS | Status: DC | PRN
  Administered 2021-11-06: 25 ug/min via INTRAVENOUS

## 2021-11-06 MED ORDER — FENTANYL CITRATE (PF) 100 MCG/2ML IJ SOLN
25.0000 ug | INTRAMUSCULAR | Status: AC | PRN
  Administered 2021-11-06 (×4): 25 ug via INTRAVENOUS

## 2021-11-06 MED ORDER — METHYLPREDNISOLONE ACETATE 40 MG/ML IJ SUSP
INTRAMUSCULAR | Status: DC | PRN
  Administered 2021-11-06: 40 mg

## 2021-11-06 MED ORDER — FENTANYL CITRATE (PF) 100 MCG/2ML IJ SOLN
INTRAMUSCULAR | Status: AC
  Administered 2021-11-06: 16:00:00 25 ug via INTRAVENOUS
  Filled 2021-11-06: qty 2

## 2021-11-06 MED ORDER — EPHEDRINE 5 MG/ML INJ
INTRAVENOUS | Status: AC
  Filled 2021-11-06: qty 5

## 2021-11-06 MED ORDER — ONDANSETRON HCL 4 MG/2ML IJ SOLN
4.0000 mg | Freq: Once | INTRAMUSCULAR | Status: AC | PRN
  Administered 2021-11-06: 17:00:00 4 mg via INTRAVENOUS

## 2021-11-06 MED ORDER — SURGIFLO WITH THROMBIN (HEMOSTATIC MATRIX KIT) OPTIME
TOPICAL | Status: DC | PRN
  Administered 2021-11-06: 1 via TOPICAL

## 2021-11-06 MED ORDER — OXYCODONE HCL 5 MG PO TABS: 5.0000 mg | ORAL_TABLET | ORAL | 0 refills | Status: DC | PRN

## 2021-11-06 MED ORDER — DEXAMETHASONE SODIUM PHOSPHATE 10 MG/ML IJ SOLN
INTRAMUSCULAR | Status: AC
  Filled 2021-11-06: qty 1

## 2021-11-06 MED ORDER — METHOCARBAMOL 500 MG PO TABS: 500.0000 mg | ORAL_TABLET | Freq: Four times a day (QID) | ORAL | 0 refills | Status: DC

## 2021-11-06 MED ORDER — ONDANSETRON HCL 4 MG/2ML IJ SOLN
INTRAMUSCULAR | Status: DC | PRN
  Administered 2021-11-06: 4 mg via INTRAVENOUS

## 2021-11-06 MED ORDER — DEXAMETHASONE SODIUM PHOSPHATE 10 MG/ML IJ SOLN
INTRAMUSCULAR | Status: DC | PRN
  Administered 2021-11-06: 5 mg via INTRAVENOUS

## 2021-11-06 MED ORDER — ROCURONIUM BROMIDE 100 MG/10ML IV SOLN
INTRAVENOUS | Status: DC | PRN
  Administered 2021-11-06: 5 mg via INTRAVENOUS

## 2021-11-06 MED ORDER — CEFAZOLIN SODIUM-DEXTROSE 2-4 GM/100ML-% IV SOLN
INTRAVENOUS | Status: AC
  Filled 2021-11-06: qty 100

## 2021-11-06 MED ORDER — MIDAZOLAM HCL 2 MG/2ML IJ SOLN
INTRAMUSCULAR | Status: DC | PRN
  Administered 2021-11-06: 2 mg via INTRAVENOUS

## 2021-11-06 MED ORDER — MIDAZOLAM HCL 2 MG/2ML IJ SOLN
INTRAMUSCULAR | Status: AC
  Filled 2021-11-06: qty 2

## 2021-11-06 MED ORDER — CHLORHEXIDINE GLUCONATE 0.12 % MT SOLN: 15.0000 mL | Freq: Once | OROMUCOSAL | Status: DC

## 2021-11-06 MED ORDER — LACTATED RINGERS IV SOLN: INTRAVENOUS | Status: DC

## 2021-11-06 MED ORDER — KETAMINE HCL 10 MG/ML IJ SOLN
INTRAMUSCULAR | Status: DC | PRN
  Administered 2021-11-06: 20 mg via INTRAVENOUS
  Administered 2021-11-06: 10 mg via INTRAVENOUS

## 2021-11-06 MED ORDER — FENTANYL CITRATE (PF) 100 MCG/2ML IJ SOLN
INTRAMUSCULAR | Status: AC
  Filled 2021-11-06: qty 2

## 2021-11-06 MED ORDER — BUPIVACAINE LIPOSOME 1.3 % IJ SUSP
INTRAMUSCULAR | Status: AC
  Filled 2021-11-06: qty 20

## 2021-11-06 MED ORDER — ACETAMINOPHEN 10 MG/ML IV SOLN
INTRAVENOUS | Status: AC
  Filled 2021-11-06: qty 100

## 2021-11-06 MED ORDER — LIDOCAINE HCL (CARDIAC) PF 100 MG/5ML IV SOSY
PREFILLED_SYRINGE | INTRAVENOUS | Status: DC | PRN
  Administered 2021-11-06: 50 mg via INTRAVENOUS

## 2021-11-06 MED ORDER — SUCCINYLCHOLINE CHLORIDE 200 MG/10ML IV SOSY
PREFILLED_SYRINGE | INTRAVENOUS | Status: DC | PRN
  Administered 2021-11-06: 80 mg via INTRAVENOUS

## 2021-11-06 MED ORDER — SODIUM CHLORIDE FLUSH 0.9 % IV SOLN
INTRAVENOUS | Status: AC
  Filled 2021-11-06: qty 20

## 2021-11-06 MED ORDER — OXYCODONE HCL 5 MG PO TABS
ORAL_TABLET | ORAL | Status: AC
  Filled 2021-11-06: qty 1

## 2021-11-06 MED ORDER — CEFAZOLIN SODIUM-DEXTROSE 2-4 GM/100ML-% IV SOLN: 2.0000 g | INTRAVENOUS | Status: DC

## 2021-11-06 MED ORDER — CHLORHEXIDINE GLUCONATE 0.12 % MT SOLN
OROMUCOSAL | Status: AC
  Filled 2021-11-06: qty 15

## 2021-11-06 MED ORDER — BUPIVACAINE-EPINEPHRINE (PF) 0.5% -1:200000 IJ SOLN
INTRAMUSCULAR | Status: DC | PRN
  Administered 2021-11-06: 6 mL

## 2021-11-06 MED ORDER — FENTANYL CITRATE (PF) 100 MCG/2ML IJ SOLN
INTRAMUSCULAR | Status: DC | PRN
  Administered 2021-11-06: 50 ug via INTRAVENOUS
  Administered 2021-11-06 (×2): 12.5 ug via INTRAVENOUS

## 2021-11-06 MED ORDER — PROPOFOL 10 MG/ML IV BOLUS
INTRAVENOUS | Status: DC | PRN
  Administered 2021-11-06: 90 mg via INTRAVENOUS

## 2021-11-06 MED ORDER — KETAMINE HCL 50 MG/5ML IJ SOSY
PREFILLED_SYRINGE | INTRAMUSCULAR | Status: AC
  Filled 2021-11-06: qty 5

## 2021-11-06 MED ORDER — BUPIVACAINE-EPINEPHRINE (PF) 0.5% -1:200000 IJ SOLN
INTRAMUSCULAR | Status: AC
  Filled 2021-11-06: qty 30

## 2021-11-06 MED ORDER — METHYLPREDNISOLONE ACETATE 40 MG/ML IJ SUSP
INTRAMUSCULAR | Status: AC
  Filled 2021-11-06: qty 1

## 2021-11-06 MED ORDER — ACETAMINOPHEN 10 MG/ML IV SOLN
INTRAVENOUS | Status: DC | PRN
  Administered 2021-11-06: 1000 mg via INTRAVENOUS

## 2021-11-06 MED ORDER — PHENYLEPHRINE HCL (PRESSORS) 10 MG/ML IV SOLN
INTRAVENOUS | Status: DC | PRN
  Administered 2021-11-06: 160 ug via INTRAVENOUS
  Administered 2021-11-06: 80 ug via INTRAVENOUS

## 2021-11-06 MED ORDER — SUCCINYLCHOLINE CHLORIDE 200 MG/10ML IV SOSY
PREFILLED_SYRINGE | INTRAVENOUS | Status: AC
  Filled 2021-11-06: qty 10

## 2021-11-06 MED ORDER — 0.9 % SODIUM CHLORIDE (POUR BTL) OPTIME
TOPICAL | Status: DC | PRN
  Administered 2021-11-06: 15:00:00 500 mL

## 2021-11-06 MED ORDER — OXYCODONE HCL 5 MG PO TABS
5.0000 mg | ORAL_TABLET | Freq: Once | ORAL | Status: AC
  Administered 2021-11-06: 17:00:00 5 mg via ORAL

## 2021-11-06 SURGICAL SUPPLY — 59 items
ADH SKN CLS APL DERMABOND .7 (GAUZE/BANDAGES/DRESSINGS) ×1
AGENT HMST KT MTR STRL THRMB (HEMOSTASIS) ×1
APL PRP STRL LF DISP 70% ISPRP (MISCELLANEOUS) ×2
BUR NEURO DRILL SOFT 3.0X3.8M (BURR) ×3 IMPLANT
CHLORAPREP W/TINT 26 (MISCELLANEOUS) ×6 IMPLANT
CNTNR SPEC 2.5X3XGRAD LEK (MISCELLANEOUS) ×1
CONT SPEC 4OZ STER OR WHT (MISCELLANEOUS) ×2
CONT SPEC 4OZ STRL OR WHT (MISCELLANEOUS) ×1
CONTAINER SPEC 2.5X3XGRAD LEK (MISCELLANEOUS) ×1 IMPLANT
COUNTER NEEDLE 20/40 LG (NEEDLE) ×3 IMPLANT
CUP MEDICINE 2OZ PLAST GRAD ST (MISCELLANEOUS) ×6 IMPLANT
DERMABOND ADVANCED (GAUZE/BANDAGES/DRESSINGS) ×2
DERMABOND ADVANCED .7 DNX12 (GAUZE/BANDAGES/DRESSINGS) ×1 IMPLANT
DRAPE C ARM PK CFD 31 SPINE (DRAPES) ×3 IMPLANT
DRAPE LAPAROTOMY 100X77 ABD (DRAPES) ×3 IMPLANT
DRAPE MICROSCOPE SPINE 48X150 (DRAPES) ×3 IMPLANT
DRAPE SURG 17X11 SM STRL (DRAPES) ×3 IMPLANT
DRSG OPSITE POSTOP 4X6 (GAUZE/BANDAGES/DRESSINGS) ×2 IMPLANT
ELECT CAUTERY BLADE TIP 2.5 (TIP) ×3
ELECT EZSTD 165MM 6.5IN (MISCELLANEOUS)
ELECT REM PT RETURN 9FT ADLT (ELECTROSURGICAL) ×3
ELECTRODE CAUTERY BLDE TIP 2.5 (TIP) ×1 IMPLANT
ELECTRODE EZSTD 165MM 6.5IN (MISCELLANEOUS) IMPLANT
ELECTRODE REM PT RTRN 9FT ADLT (ELECTROSURGICAL) ×1 IMPLANT
GAUZE 4X4 16PLY ~~LOC~~+RFID DBL (SPONGE) ×3 IMPLANT
GLOVE SURG SYN 6.5 ES PF (GLOVE) ×6 IMPLANT
GLOVE SURG SYN 6.5 PF PI (GLOVE) ×2 IMPLANT
GLOVE SURG SYN 8.5  E (GLOVE) ×9
GLOVE SURG SYN 8.5 E (GLOVE) ×3 IMPLANT
GLOVE SURG SYN 8.5 PF PI (GLOVE) ×3 IMPLANT
GLOVE SURG UNDER POLY LF SZ6.5 (GLOVE) ×3 IMPLANT
GOWN SRG LRG LVL 4 IMPRV REINF (GOWNS) ×1 IMPLANT
GOWN SRG XL LVL 3 NONREINFORCE (GOWNS) ×1 IMPLANT
GOWN STRL NON-REIN TWL XL LVL3 (GOWNS) ×3
GOWN STRL REIN LRG LVL4 (GOWNS) ×3
GRADUATE 1200CC STRL 31836 (MISCELLANEOUS) ×3 IMPLANT
KIT SPINAL PRONEVIEW (KITS) ×3 IMPLANT
MANIFOLD NEPTUNE II (INSTRUMENTS) ×3 IMPLANT
MARKER SKIN DUAL TIP RULER LAB (MISCELLANEOUS) ×6 IMPLANT
NDL SAFETY ECLIPSE 18X1.5 (NEEDLE) ×1 IMPLANT
NEEDLE HYPO 18GX1.5 SHARP (NEEDLE) ×3
NEEDLE HYPO 22GX1.5 SAFETY (NEEDLE) ×3 IMPLANT
NS IRRIG 1000ML POUR BTL (IV SOLUTION) ×1 IMPLANT
NS IRRIG 500ML POUR BTL (IV SOLUTION) ×2 IMPLANT
PACK LAMINECTOMY NEURO (CUSTOM PROCEDURE TRAY) ×3 IMPLANT
PAD ARMBOARD 7.5X6 YLW CONV (MISCELLANEOUS) ×3 IMPLANT
SURGIFLO W/THROMBIN 8M KIT (HEMOSTASIS) ×3 IMPLANT
SUT DVC VLOC 3-0 CL 6 P-12 (SUTURE) ×3 IMPLANT
SUT VIC AB 0 CT1 27 (SUTURE) ×3
SUT VIC AB 0 CT1 27XCR 8 STRN (SUTURE) ×1 IMPLANT
SUT VIC AB 2-0 CT1 18 (SUTURE) ×3 IMPLANT
SYR 10ML LL (SYRINGE) ×3 IMPLANT
SYR 20ML LL LF (SYRINGE) ×3 IMPLANT
SYR 30ML LL (SYRINGE) ×6 IMPLANT
SYR 3ML LL SCALE MARK (SYRINGE) ×3 IMPLANT
TOWEL OR 17X26 4PK STRL BLUE (TOWEL DISPOSABLE) ×9 IMPLANT
TUBING CONNECTING 10 (TUBING) ×2 IMPLANT
TUBING CONNECTING 10' (TUBING) ×1
WATER STERILE IRR 500ML POUR (IV SOLUTION) ×3 IMPLANT

## 2021-11-06 NOTE — Transfer of Care (Signed)
Immediate Anesthesia Transfer of Care Note  Patient: Tamara [name]  Procedure(s) Performed: RIGHT L4-5 FAR LATERAL DISCECTOMY (Right: Spine Lumbar)  Patient Location: PACU  Anesthesia Type:General  Level of Consciousness: drowsy  Airway & Oxygen Therapy: Patient Spontanous Breathing and Patient connected to face mask oxygen  Post-op Assessment: Report given to RN and Post -op Vital signs reviewed and stable  Post vital signs: Reviewed and stable  Last Vitals:  Vitals Value Taken Time  BP 148/83 11/06/21 1556  Temp 36.1 C 11/06/21 1556  Pulse 81 11/06/21 1600  Resp 19 11/06/21 1600  SpO2 100 % 11/06/21 1600  Vitals shown include unvalidated device data.  Last Pain:  Vitals:   11/06/21 1220  TempSrc: Oral  PainSc: 5          Complications: No notable events documented.

## 2021-11-06 NOTE — Op Note (Signed)
Indications: The patient is a 75 yo female who presented with lumbar radiculopathy who failed conservative management.  Findings: far lateral disc herniation at L4/5.  Preoperative Diagnosis: Lumbar radiculopathy Postoperative Diagnosis: same   EBL: 20 ml IVF: see AR ml Drains: none Disposition: Extubated and Stable to PACU Complications: none  No foley catheter was placed.   Preoperative Note:   Risks of surgery discussed include: infection, bleeding, stroke, coma, death, paralysis, CSF leak, nerve/spinal cord injury, numbness, tingling, weakness, complex regional pain syndrome, recurrent stenosis and/or disc herniation, vascular injury, development of instability, neck/back pain, need for further surgery, persistent symptoms, development of deformity, and the risks of anesthesia. The patient understood these risks and agreed to proceed.  Operative Note:   1) Right L4/5 Far lateral discectomy and foraminotomy  The patient was then brought from the preoperative center with intravenous access established.  The patient underwent general anesthesia and endotracheal tube intubation, and was then rotated on the [place] rail top where all pressure points were appropriately padded.  The skin was then thoroughly cleansed.  Perioperative antibiotic prophylaxis was administered.  Sterile prep and drapes were then applied and a timeout was then observed.  C-arm was brought into the field under sterile conditions, and the L4/5 disc space identified and marked with an incision on the right ~4cm lateral to midline.   Once this was complete a 2 cm incision was opened with the use of a #10 blade knife.  The Metrx tubes were sequentially advanced under lateral fluoroscopy until a 18 x 50 mm Metrx tube was placed over the right L5 transverse process and secured to the bed.    The microscope was then sterilely brought into the field and muscle creep was hemostased with a bipolar and resected with a  pituitary rongeur.  A Bovie extender was then used to expose the transverse process and lateral facet.  Careful attention was placed to not violate the facet capsule. A 3 mm matchstick drill bit was then used to drill off the top of the L5 transverse process, lateral L4/5 facet, and lateral pars.  The intertransverse membrane was identified, and carefully dissected free from the caudal transverse process.    Using careful dissected, the superior aspect of the L5 pedicle was identified and exposed down to its junction with the vertebral body.  The disc was identified. The disc was entered using a small Penfield 4. The L4 nerve root was identified and freed using a Penfield 4 and balltip probe.  The disc herniation was identified and dissected free using a balltip probe. The pituitary rongeur was used to remove the extruded disc fragments. Once the nerve root were noted to be relaxed and under less tension the ball-tipped feeler was passed along the foramen proximally to to ensure no residual compression was noted.    Depo-Medrol was placed along the nerve root.  The area was irrigated. The tube system was then removed under microscopic visualization and hemostasis was obtained with a bipolar.    The fascial layer was reapproximated with the use of a 0- Vicryl suture.  Subcutaneous tissue layer was reapproximated using 2-0 Vicryl suture.  3-0 monocryl was used on the skin. The skin was then cleansed and Dermabond was used to close the skin opening.  Patient was then rotated back to the preoperative bed awakened from anesthesia and taken to recovery all counts are correct in this case.   I performed the entire procedure with the assistance of Cooper Render PA as  an Pensions consultant.  Meade Maw MD

## 2021-11-06 NOTE — Anesthesia Postprocedure Evaluation (Addendum)
Anesthesia Post Note  Patient: Tamara [name]  Procedure(s) Performed: RIGHT L4-5 FAR LATERAL DISCECTOMY (Right: Spine Lumbar)  Patient location during evaluation: PACU Anesthesia Type: General Level of consciousness: awake and alert, awake and oriented Pain management: pain level controlled Vital Signs Assessment: post-procedure vital signs reviewed and stable Respiratory status: spontaneous breathing, nonlabored ventilation and respiratory function stable Cardiovascular status: blood pressure returned to baseline and stable Postop Assessment: no apparent nausea or vomiting Anesthetic complications: no   No notable events documented.   Last Vitals:  Vitals:   11/06/21 1717 11/06/21 1752  BP: 121/71 125/65  Pulse: 96 96  Resp: 16 16  Temp: (!) 35.9 C   SpO2: 95% 94%    Last Pain:  Vitals:   11/06/21 1717  TempSrc: Temporal  PainSc: 2                  Phill Mutter

## 2021-11-06 NOTE — Anesthesia Procedure Notes (Signed)
Procedure Name: Intubation Date/Time: 11/06/2021 2:06 PM Performed by: Lia Foyer, CRNA Pre-anesthesia Checklist: Patient identified, Emergency Drugs available, Suction available and Patient being monitored Patient Re-evaluated:Patient Re-evaluated prior to induction Oxygen Delivery Method: Circle system utilized Preoxygenation: Pre-oxygenation with 100% oxygen Induction Type: IV induction Ventilation: Mask ventilation without difficulty Laryngoscope Size: McGraph and 3 Grade View: Grade I Tube type: Oral Tube size: 6.5 mm Number of attempts: 1 Airway Equipment and Method: Stylet and Video-laryngoscopy Placement Confirmation: ETT inserted through vocal cords under direct vision, positive ETCO2 and breath sounds checked- equal and bilateral Secured at: 20 cm Tube secured with: Tape Dental Injury: Teeth and Oropharynx as per pre-operative assessment

## 2021-11-06 NOTE — H&P (Signed)
I have reviewed and confirmed my history and physical from 10/16/21 with no additions or changes. Plan for R L4-5 far lateral discectomy.  Risks and benefits reviewed.  Heart sounds normal no MRG. Chest Clear to Auscultation Bilaterally.

## 2021-11-06 NOTE — Discharge Instructions (Addendum)
Your surgeon has performed an operation on your lumbar spine (low back) to relieve pressure on one or more nerves. Many times, patients feel better immediately after surgery and can overdo it. Even if you feel well, it is important that you follow these activity guidelines. If you do not let your back heal properly from the surgery, you can increase the chance of a disc herniation and/or return of your symptoms. The following are instructions to help in your recovery once you have been discharged from the hospital.  * Do not take anti-inflammatory medications for 3 days after surgery (naproxen [Aleve], ibuprofen [Advil, Motrin], etc.) *Please hold Warfarin for 10 days  Activity    No bending, lifting, or twisting (BLT). Avoid lifting objects heavier than 10 pounds (gallon milk jug).  Where possible, avoid household activities that involve lifting, bending, pushing, or pulling such as laundry, vacuuming, grocery shopping, and childcare. Try to arrange for help from friends and family for these activities while your back heals.  Increase physical activity slowly as tolerated.  Taking short walks is encouraged, but avoid strenuous exercise. Do not jog, run, bicycle, lift weights, or participate in any other exercises unless specifically allowed by your doctor. Avoid prolonged sitting, including car rides.  Talk to your doctor before resuming sexual activity.  You should not drive until cleared by your doctor.  Until released by your doctor, you should not return to work or school.  You should rest at home and let your body heal.   You may shower two days after your surgery.  After showering, lightly dab your incision dry. Do not take a tub bath or go swimming for 3 weeks, or until approved by your doctor at your follow-up appointment.  If you smoke, we strongly recommend that you quit.  Smoking has been proven to interfere with normal healing in your back and will dramatically reduce the  success rate of your surgery. Please contact [facility] (800-QUIT-NOW) and use the resources at www.[facility].com for assistance in stopping smoking.  Surgical Incision   If you have a dressing on your incision, you may remove it three days after your surgery. Keep your incision area clean and dry.  Your incision was closed with Dermabond glue. The glue should begin to peel away within about a week  Diet            You may return to your usual diet. Be sure to stay hydrated.  When to Contact us  Although your surgery and recovery will likely be uneventful, you may have some residual numbness, aches, and pains in your back and/or legs. This is normal and should improve in the next few weeks.  However, should you experience any of the following, contact us immediately: New numbness or weakness Pain that is progressively getting worse, and is not relieved by your pain medications or rest Bleeding, redness, swelling, pain, or drainage from surgical incision Chills or flu-like symptoms Fever greater than 101.0 F (38.3 C) Problems with bowel or bladder functions Difficulty breathing or shortness of breath Warmth, tenderness, or swelling in your calf  Contact Information During office hours (Monday-Friday 9 am to 5 pm), please call your physician at [phone] After hours and weekends, please call [phone] and speak with the answering service, who will contact the doctor on call.  If that fails, call the [place] Operator at [phone] and ask for the Neurosurgery Resident On Call  For a life-threatening emergency, call [place]  INSTRUCTIONS   The drugs that you were given will stay in your system until tomorrow so for the next 24 hours you should not:  Drive an automobile Make any legal decisions Drink any alcoholic beverage   You may resume regular meals tomorrow.  Today it is better to start with liquids and gradually work up to solid  foods.  You may eat anything you prefer, but it is better to start with liquids, then soup and crackers, and gradually work up to solid foods.   Please notify your doctor immediately if you have any unusual bleeding, trouble breathing, redness and pain at the surgery site, drainage, fever, or pain not relieved by medication.    Additional Instructions:   Please contact your physician with any problems or Same Day Surgery at [phone], Monday through Friday 6 am to 4 pm, or [place] at [facility] number at [phone].

## 2021-11-06 NOTE — Discharge Summary (Signed)
Physician Discharge Summary  Patient ID: Tamara [name] MRN: [id number] DOB/AGE: 12-05-1945 75 y.o.  Admit date: 11/06/2021 Discharge date: 11/06/2021  Admission Diagnoses: lumbar radiculopathy   Discharge Diagnoses:  Active Problems:   * No active hospital problems. *   Discharged Condition: good  Hospital Course:  Tamara [name] is a 75 y.o presenting with right sided lumbar radiculopathy. She underwent a MIS right far lateral L4-5 decompression. Her interoperative course was uncomplicated. She was monitored post-op in the PACU and discharged home after ambulating, urinating, and tolerating PO intake. She was given prescriptions for Oxycodone and Robaxin.   Consults: None  Significant Diagnostic Studies: none   Treatments: surgery: as above. Please see separately dictated operative report for further details,   Discharge Exam: Blood pressure 125/69, pulse 88, temperature (!) 97.5 F (36.4 C), temperature source Oral, resp. rate 18, height 5' 1"  (1.549 m), weight 43.1 kg, SpO2 95 %. CN II-XII grossly intact Good and equal strength in BLE Incision c/d/i  Disposition: Discharge disposition: 01-Home or Self Care        Allergies as of 11/06/2021       Reactions   Apixaban Nausea Only   Dizziness   Sulfa Antibiotics Rash        Medication List     STOP taking these medications    warfarin 1 MG tablet Commonly known as: COUMADIN   warfarin 5 MG tablet Commonly known as: COUMADIN       TAKE these medications    ciprofloxacin 500 MG tablet Commonly known as: Cipro Take 1 tablet (500 mg total) by mouth 2 (two) times daily for 3 days. Increase WATER intake while taking this medication.   clonazePAM 0.5 MG tablet Commonly known as: KLONOPIN Take 1 tablet (0.5 mg total) by mouth at bedtime.   cyanocobalamin 1000 MCG/ML injection Commonly known as: (VITAMIN B-12) Inject 1 mL (1,000 mcg total) into the muscle every 30 (thirty) days.    Fluticasone-Salmeterol 250-50 MCG/DOSE Aepb Commonly known as: ADVAIR Inhale 1 puff into the lungs 2 (two) times daily as needed (shortness of braeth).   gabapentin 300 MG capsule Commonly known as: NEURONTIN Take 900 mg by mouth at bedtime.   methocarbamol 500 MG tablet Commonly known as: Robaxin Take 1 tablet (500 mg total) by mouth 4 (four) times daily. What changed:  when to take this reasons to take this   nortriptyline 25 MG capsule Commonly known as: PAMELOR TAKE 1 CAPSULE BY MOUTH AT BEDTIME   omeprazole 40 MG capsule Commonly known as: PRILOSEC Take 40 mg by mouth 2 (two) times daily.   ondansetron 4 MG disintegrating tablet Commonly known as: Zofran ODT Take 1 tablet (4 mg total) by mouth every 8 (eight) hours as needed for nausea or vomiting.   oxyCODONE 5 MG immediate release tablet Commonly known as: Roxicodone Take 1 tablet (5 mg total) by mouth every 4 (four) hours as needed for up to 5 days for severe pain.   rosuvastatin 10 MG tablet Commonly known as: CRESTOR TAKE 1 TABLET BY MOUTH AT BEDTIME   sertraline 100 MG tablet Commonly known as: ZOLOFT TAKE 1 TABLET BY MOUTH ONCE DAILY What changed: when to take this   Spiriva HandiHaler 18 MCG inhalation capsule Generic drug: tiotropium Place 1 capsule (18 mcg total) into inhaler and inhale daily as needed (shortness of breath).   SYRINGE 3CC/25GX1" 25G X 1" 3 ML Misc Use for b12 injections   traZODone 50 MG tablet Commonly known as: DESYREL  TAKE 2 TABLETS BY MOUTH AT BEDTIME        Follow-up Information     Loleta Dicker, PA Follow up in 2 week(s).   Why: for incision check. Contact information: [place] [address] [phone]                 Signed: Loleta Dicker 11/06/2021, 3:58 PM

## 2021-11-06 NOTE — Progress Notes (Signed)
Patient tolerated some cookies and applesauce in PACU, she voided 250cc, and ambulated 25-30 feet in the hallway. Pain is well controlled. Discharge instructions were reviewed with the patient and her son (over the phone).

## 2021-11-06 NOTE — Anesthesia Preprocedure Evaluation (Signed)
Anesthesia Evaluation  Patient identified by MRN, date of birth, ID band Patient awake    Reviewed: Allergy & Precautions, NPO status , Patient's Chart, lab work & pertinent test results  History of Anesthesia Complications Negative for: history of anesthetic complications  Airway Mallampati: III  TM Distance: <3 FB Neck ROM: full    Dental  (+) Chipped, Poor Dentition, Missing, Dental Advidsory Given   Pulmonary neg shortness of breath, sleep apnea , COPD, neg recent URI, Current Smoker and Patient abstained from smoking.,    Pulmonary exam normal        Cardiovascular Exercise Tolerance: Good (-) hypertension(-) angina+ CAD  (-) Past MI Normal cardiovascular exam(-) dysrhythmias      Neuro/Psych  Headaches, neg Seizures PSYCHIATRIC DISORDERS Anxiety Depression  Neuromuscular disease    GI/Hepatic Neg liver ROS, hiatal hernia, GERD  Medicated and Controlled,  Endo/Other  negative endocrine ROS  Renal/GU Renal disease (kidney stones)     Musculoskeletal  (+) Arthritis ,   Abdominal   Peds  Hematology negative hematology ROS (+)   Anesthesia Other Findings Past Medical History: No date: Aortic atherosclerosis (HCC) No date: Arthritis No date: B12 deficiency No date: Bowel obstruction ([place])     Comment:  s/p laparotomy/ LOA 3/08, prior reversal of jejunal               bypasss 2004 No date: Carpal tunnel syndrome No date: Chronic abdominal pain 05/24/2021: Clostridium difficile infection No date: COPD (chronic obstructive pulmonary disease) ([place])     Comment:  no inhalers No date: Crohn's disease ([place])     Comment:  remission 2016 No date: Depression 04/24/2018: DVT (deep venous thrombosis) ([place]) 05/2021: DVT (deep venous thrombosis) (H[facility])     Comment:  left leg 12/04/2017: Dysphagia 07/11/2018: Facial twitching No date: GERD (gastroesophageal reflux disease) No date: Headache 01/2007: History of DVT  (deep vein thrombosis)     Comment:  post operative right leg, s/p vena cava filter No date: History of hiatal hernia No date: History of kidney stones 05/20/2019: Intractable chronic migraine without aura and without  status migrainosus No date: Leukocytosis No date: Leukocytosis No date: Osteoporosis No date: Pneumonia No date: Sleep apnea No date: Small bowel obstruction ([address])     Comment:  2001, 2004  Past Surgical History: No date: ANAL SPHINCTER PROSTHESIS PLACEMENT No date: APPENDECTOMY 04/22/2014: CARPAL TUNNEL RELEASE; Right No date: CATARACT EXTRACTION 2005: CERVICAL FUSION     Comment:  C6-7, anterior approach No date: CHOLECYSTECTOMY 2015: COLONOSCOPY No date: COLONOSCOPY     Comment:  2005, 2007, 2010, 2015, 2020, 2021 05/10/2019: COLONOSCOPY WITH PROPOFOL; N/A     Comment:  Procedure: COLONOSCOPY WITH PROPOFOL;  Surgeon: Manya Silvas, MD;  Location: [facility] ENDOSCOPY;  Service:               Endoscopy;  Laterality: N/A; 02/11/2018: ESOPHAGEAL MANOMETRY; N/A     Comment:  Procedure: ESOPHAGEAL MANOMETRY (EM);  Surgeon: Lucilla Lame, MD;  Location: [facility] ENDOSCOPY;  Service:               Endoscopy;  Laterality: N/A; No date: ESOPHAGOGASTRODUODENOSCOPY     Comment:  1996, 2006, 2008, 2010, 2014, 2018, 2021 12/17/2017: ESOPHAGOGASTRODUODENOSCOPY (EGD) WITH PROPOFOL; N/A     Comment:  Procedure: ESOPHAGOGASTRODUODENOSCOPY (EGD) WITH  PROPOFOL;  Surgeon: Robert Bellow, MD;  Location:               [facility] ENDOSCOPY;  Service: Endoscopy;  Laterality: N/A; 01/08/2018: ESOPHAGOGASTRODUODENOSCOPY (EGD) WITH PROPOFOL; N/A     Comment:  Procedure: ESOPHAGOGASTRODUODENOSCOPY (EGD) WITH               PROPOFOL;  Surgeon: Robert Bellow, MD;  Location:               [facility] ENDOSCOPY;  Service: Endoscopy;  Laterality: N/A; No date: gyn surgery     Comment:  hysterectomy No date: INSERTION OF VENA CAVA FILTER; Right 06/22/2020:  KNEE ARTHROSCOPY WITH MEDIAL MENISECTOMY; Left     Comment:  Procedure: Left knee arthroscopic partial medial               meniscectomy;  Surgeon: Leim Fabry, MD;  Location:               [facility];  Service: Orthopedics;  Laterality:               Left; 05/25/2021: KNEE ARTHROSCOPY WITH MEDIAL MENISECTOMY; Left     Comment:  Procedure: Left knee arthroscopy, infrapatellar fat pad               excision;  Surgeon: Leim Fabry, MD;  Location: [facility]               ORS;  Service: Orthopedics;  Laterality: Left; 01/30/2007: LAPAROTOMY     Comment:  for bowel obstruction with LOA 12/25/2016: LUMBAR LAMINECTOMY/DECOMPRESSION MICRODISCECTOMY; N/A     Comment:  Procedure: LUMBAR DECOMPRESSION L4-5;  Surgeon: Meade Maw, MD;  Location: [facility] ORS;  Service:               Neurosurgery;  Laterality: N/A; No date: NISSEN FUNDOPLICATION No date: ROTATOR CUFF REPAIR; Bilateral No date: SPLENECTOMY     Comment:  secondary to colonoscopy No date: TONSILLECTOMY AND ADENOIDECTOMY 08/25/2015: TRIGGER FINGER RELEASE; Left 09/18/2015: TRIGGER FINGER RELEASE; Right No date: VAGINAL HYSTERECTOMY  BMI    Body Mass Index: 17.95 kg/m      Reproductive/Obstetrics negative OB ROS                             Anesthesia Physical  Anesthesia Plan  ASA: 3  Anesthesia Plan: General ETT   Post-op Pain Management:    Induction: Intravenous  PONV Risk Score and Plan: 1 and Dexamethasone, Ondansetron and Treatment may vary due to age or medical condition  Airway Management Planned: Oral ETT  Additional Equipment:   Intra-op Plan:   Post-operative Plan: Extubation in OR  Informed Consent: I have reviewed the patients History and Physical, chart, labs and discussed the procedure including the risks, benefits and alternatives for the proposed anesthesia with the patient or authorized representative who has indicated his/her understanding and  acceptance.     Dental Advisory Given  Plan Discussed with: Anesthesiologist, CRNA and Surgeon  Anesthesia Plan Comments: (Patient consented for risks of anesthesia including but not limited to:  - adverse reactions to medications - damage to eyes, teeth, lips or other oral mucosa - nerve damage due to positioning  - sore throat or hoarseness - Damage to heart, brain, nerves, lungs, other parts of body or loss of life  Patient voiced understanding.)  Anesthesia Quick Evaluation

## 2021-11-06 NOTE — Progress Notes (Signed)
PHARMACY -  BRIEF ANTIBIOTIC NOTE   Pharmacy has received consult(s) for cefazolin for surgical prophylaxis.  The patient's profile has been reviewed for ht/wt/allergies/indication/available labs.    One time order(s) placed for cefazolin 2 g IV  Further antibiotics/pharmacy consults should be ordered by admitting physician if indicated.                       Thank you, Forde Dandy Katie Moch 11/06/2021  12:01 PM

## 2021-11-07 ENCOUNTER — Ambulatory Visit: Payer: Medicare HMO | Admitting: Internal Medicine

## 2021-11-08 ENCOUNTER — Encounter: Payer: Self-pay | Admitting: Internal Medicine

## 2021-11-08 ENCOUNTER — Other Ambulatory Visit: Payer: Self-pay

## 2021-11-08 ENCOUNTER — Telehealth (INDEPENDENT_AMBULATORY_CARE_PROVIDER_SITE_OTHER): Payer: Medicare Other | Admitting: Internal Medicine

## 2021-11-08 ENCOUNTER — Telehealth: Payer: Self-pay | Admitting: Internal Medicine

## 2021-11-08 ENCOUNTER — Other Ambulatory Visit: Payer: Self-pay | Admitting: Internal Medicine

## 2021-11-08 ENCOUNTER — Ambulatory Visit (INDEPENDENT_AMBULATORY_CARE_PROVIDER_SITE_OTHER): Payer: Medicare Other

## 2021-11-08 DIAGNOSIS — J069 Acute upper respiratory infection, unspecified: Secondary | ICD-10-CM

## 2021-11-08 DIAGNOSIS — J22 Unspecified acute lower respiratory infection: Secondary | ICD-10-CM | POA: Insufficient documentation

## 2021-11-08 LAB — POCT INFLUENZA A/B
Influenza A, POC: NEGATIVE
Influenza B, POC: NEGATIVE

## 2021-11-08 LAB — POC COVID19 BINAXNOW: SARS Coronavirus 2 Ag: NEGATIVE

## 2021-11-08 MED ORDER — AZITHROMYCIN 500 MG PO TABS: 500.0000 mg | ORAL_TABLET | Freq: Every day | ORAL | 0 refills | Status: DC

## 2021-11-08 MED ORDER — BENZONATATE 200 MG PO CAPS: 200.0000 mg | ORAL_CAPSULE | Freq: Three times a day (TID) | ORAL | 1 refills | Status: DC | PRN

## 2021-11-08 MED ORDER — AMOXICILLIN-POT CLAVULANATE 875-125 MG PO TABS: 1.0000 | ORAL_TABLET | Freq: Two times a day (BID) | ORAL | 0 refills | Status: DC

## 2021-11-08 NOTE — Assessment & Plan Note (Signed)
She tested negative for influenza and COVID 19.  Will treat empirically for pneumonia (community acquired vs nosocomial given surgery on Dec 27) with augmentin and azithromycin (she has deferred  Chest ray but advised to go to [facility] on Monday if symptoms have not improved by then).  Request for sedating cough medication denied since she is still taking oxycodone for post operative pain. benzonotate capsules sent instead,

## 2021-11-08 NOTE — Progress Notes (Signed)
Virtual Visit converted to Telephone  Note  This visit type was conducted due to national recommendations for restrictions regarding the COVID-19 pandemic (e.g. social distancing).  This format is felt to be most appropriate for this patient at this time.  All issues noted in this document were discussed and addressed.  No physical exam was performed (except for noted visual exam findings with Video Visits).   I connected withNAME@ on 11/08/21 at 12:00 PM EST by a video enabled telemedicine application and verified that I am speaking with the correct person using two identifiers. Location patient: home Location provider: work or home office Persons participating in the virtual visit: patient, provider  I discussed the limitations, risks, security and privacy concerns of performing an evaluation and management service by telephone and the availability of in person appointments. I also discussed with the patient that there may be a patient responsible charge related to this service. The patient expressed understanding and agreed to proceed.  Interactive audio and video telecommunications were attempted between this provider and patient, however failed, due to patient having technical difficulties OR patient did not have access to video capability.  We continued and completed visit with audio only.  Reason for visit: respiratory infection   HPI:  75 yr old female with Crohn's Disease,  not on immunosuppression,  2 days post op from an L4-5 right lateral diskectomy ,  presents with sudden onset of fever, to 101.7, chills, headache and cough that started this morning  . Sinuses were draining,  but now chest feels raw. She denies wheezing.  Currently taking gabapentin,  muscle relaxer and oxycodone for post operative pain    ROS: See pertinent positives and negatives per HPI.  Past Medical History:  Diagnosis Date   Aortic atherosclerosis ([place])    Arthritis    B12 deficiency    Bowel obstruction  ([place])    s/p laparotomy/ LOA 3/08, prior reversal of jejunal bypasss 2004   Carpal tunnel syndrome    Chronic abdominal pain    Clostridium difficile infection 05/24/2021   COPD (chronic obstructive pulmonary disease) ([place])    no inhalers   Crohn's disease ([place])    remission 2016   Depression    DVT (deep venous thrombosis) ([place]) 04/24/2018   DVT (deep venous thrombosis) ([place]) 05/2021   left leg   Dysphagia 12/04/2017   Facial twitching 07/11/2018   GERD (gastroesophageal reflux disease)    Headache    History of DVT (deep vein thrombosis) 01/2007   post operative right leg, s/p vena cava filter   History of hiatal hernia    History of kidney stones    Intractable chronic migraine without aura and without status migrainosus 05/20/2019   Leukocytosis    Leukocytosis    Osteoporosis    Pneumonia    Sleep apnea    Small bowel obstruction ([place])    2001, 2004    Past Surgical History:  Procedure Laterality Date   ABDOMINAL HYSTERECTOMY     ANAL SPHINCTER PROSTHESIS PLACEMENT     APPENDECTOMY     CARPAL TUNNEL RELEASE Right 04/22/2014   CATARACT EXTRACTION     CERVICAL FUSION  2005   C6-7, anterior approach   CHOLECYSTECTOMY     COLONOSCOPY  2015   COLONOSCOPY     2005, 2007, 2010, 2015, 2020, 2021   COLONOSCOPY WITH PROPOFOL N/A 05/10/2019   Procedure: COLONOSCOPY WITH PROPOFOL;  Surgeon: Manya Silvas, MD;  Location: [facility] ENDOSCOPY;  Service: Endoscopy;  Laterality:  N/A;   ESOPHAGEAL MANOMETRY N/A 02/11/2018   Procedure: ESOPHAGEAL MANOMETRY (EM);  Surgeon: Lucilla Lame, MD;  Location: [facility] ENDOSCOPY;  Service: Endoscopy;  Laterality: N/A;   ESOPHAGOGASTRODUODENOSCOPY     1996, 2006, 2008, 2010, 2014, 2018, 2021   ESOPHAGOGASTRODUODENOSCOPY (EGD) WITH PROPOFOL N/A 12/17/2017   Procedure: ESOPHAGOGASTRODUODENOSCOPY (EGD) WITH PROPOFOL;  Surgeon: Robert Bellow, MD;  Location: [facility] ENDOSCOPY;  Service: Endoscopy;  Laterality: N/A;   ESOPHAGOGASTRODUODENOSCOPY  (EGD) WITH PROPOFOL N/A 01/08/2018   Procedure: ESOPHAGOGASTRODUODENOSCOPY (EGD) WITH PROPOFOL;  Surgeon: Robert Bellow, MD;  Location: [facility] ENDOSCOPY;  Service: Endoscopy;  Laterality: N/A;   EXCISION NEUROMA Left 09/18/2021   Procedure: Excision of traumatic neuroma infrapatellar branch saphenous nerve left knee.;  Surgeon: Corky Mull, MD;  Location: [facility] ORS;  Service: Orthopedics;  Laterality: Left;   gyn surgery     hysterectomy   INSERTION OF VENA CAVA FILTER Right    KNEE ARTHROSCOPY WITH MEDIAL MENISECTOMY Left 06/22/2020   Procedure: Left knee arthroscopic partial medial meniscectomy;  Surgeon: Leim Fabry, MD;  Location: [place];  Service: Orthopedics;  Laterality: Left;   KNEE ARTHROSCOPY WITH MEDIAL MENISECTOMY Left 05/25/2021   Procedure: Left knee arthroscopy, infrapatellar fat pad excision;  Surgeon: Leim Fabry, MD;  Location: [facility] ORS;  Service: Orthopedics;  Laterality: Left;   LAPAROTOMY  01/30/2007   for bowel obstruction with LOA   LUMBAR LAMINECTOMY/DECOMPRESSION MICRODISCECTOMY N/A 12/25/2016   Procedure: LUMBAR DECOMPRESSION L4-5;  Surgeon: Meade Maw, MD;  Location: [facility] ORS;  Service: Neurosurgery;  Laterality: N/A;   NISSEN FUNDOPLICATION     ROTATOR CUFF REPAIR Bilateral    SPLENECTOMY     secondary to colonoscopy   TONSILLECTOMY AND ADENOIDECTOMY     TRIGGER FINGER RELEASE Left 08/25/2015   TRIGGER FINGER RELEASE Right 09/18/2015   VAGINAL HYSTERECTOMY      Family History  Problem Relation Age of Onset   Diabetes Mother        type 2   Heart disease Mother    Hypertension Mother    Coronary artery disease Father    Heart attack Sister     SOCIAL HX:  reports that she has been smoking cigarettes. She has a 50.00 pack-year smoking history. She has never used smokeless tobacco. She reports that she does not drink alcohol and does not use drugs.    Current Outpatient Medications:    amoxicillin-clavulanate (AUGMENTIN)  875-125 MG tablet, Take 1 tablet by mouth 2 (two) times daily., Disp: 14 tablet, Rfl: 0   azithromycin (ZITHROMAX) 500 MG tablet, Take 1 tablet (500 mg total) by mouth daily., Disp: 7 tablet, Rfl: 0   benzonatate (TESSALON) 200 MG capsule, Take 1 capsule (200 mg total) by mouth 3 (three) times daily as needed for cough., Disp: 60 capsule, Rfl: 1   ciprofloxacin (CIPRO) 500 MG tablet, Take 1 tablet (500 mg total) by mouth 2 (two) times daily for 3 days. Increase WATER intake while taking this medication., Disp: 6 tablet, Rfl: 0   clonazePAM (KLONOPIN) 0.5 MG tablet, Take 1 tablet (0.5 mg total) by mouth at bedtime., Disp: , Rfl:    cyanocobalamin (,VITAMIN B-12,) 1000 MCG/ML injection, Inject 1 mL (1,000 mcg total) into the muscle every 30 (thirty) days., Disp: 10 mL, Rfl: 2   Fluticasone-Salmeterol (ADVAIR) 250-50 MCG/DOSE AEPB, Inhale 1 puff into the lungs 2 (two) times daily as needed (shortness of braeth)., Disp: , Rfl:    gabapentin (NEURONTIN) 300 MG capsule, Take 900 mg by mouth  at bedtime., Disp: , Rfl:    methocarbamol (ROBAXIN) 500 MG tablet, Take 1 tablet (500 mg total) by mouth 4 (four) times daily., Disp: 90 tablet, Rfl: 0   nortriptyline (PAMELOR) 25 MG capsule, TAKE 1 CAPSULE BY MOUTH AT BEDTIME, Disp: 90 capsule, Rfl: 1   omeprazole (PRILOSEC) 40 MG capsule, Take 40 mg by mouth 2 (two) times daily., Disp: , Rfl:    ondansetron (ZOFRAN ODT) 4 MG disintegrating tablet, Take 1 tablet (4 mg total) by mouth every 8 (eight) hours as needed for nausea or vomiting., Disp: 20 tablet, Rfl: 0   oxyCODONE (ROXICODONE) 5 MG immediate release tablet, Take 1 tablet (5 mg total) by mouth every 4 (four) hours as needed for up to 5 days for severe pain., Disp: 30 tablet, Rfl: 0   rosuvastatin (CRESTOR) 10 MG tablet, TAKE 1 TABLET BY MOUTH AT BEDTIME, Disp: 90 tablet, Rfl: 1   sertraline (ZOLOFT) 100 MG tablet, TAKE 1 TABLET BY MOUTH ONCE DAILY (Patient taking differently: Take 100 mg by mouth at  bedtime.), Disp: 90 tablet, Rfl: 1   Syringe/Needle, Disp, (SYRINGE 3CC/25GX1") 25G X 1" 3 ML MISC, Use for b12 injections, Disp: 50 each, Rfl: 0   tiotropium (SPIRIVA HANDIHALER) 18 MCG inhalation capsule, Place 1 capsule (18 mcg total) into inhaler and inhale daily as needed (shortness of breath)., Disp: , Rfl:    traZODone (DESYREL) 50 MG tablet, TAKE 2 TABLETS BY MOUTH AT BEDTIME, Disp: 180 tablet, Rfl: 1  EXAM:   General impression: alert, cooperative and articulate.  No signs of being in distress  Lungs: speech is fluent sentence length suggests that patient is not short of breath and not punctuated by cough, sneezing or sniffing. Marland Kitchen   Psych: affect normal.  speech is articulate and non pressured .  Denies suicidal thoughts    ASSESSMENT AND PLAN:  Discussed the following assessment and plan:  Lower respiratory infection (e.g., bronchitis, pneumonia, pneumonitis, pulmonitis) - Plan: DG Chest 2 View  Lower respiratory infection (e.g., bronchitis, pneumonia, pneumonitis, pulmonitis) She tested negative for influenza and COVID 19.  Will treat empirically for pneumonia (community acquired vs nosocomial given surgery on Dec 27) with augmentin and azithromycin (she has deferred  Chest ray but advised to go to [facility] on Monday if symptoms have not improved by then).  Request for sedating cough medication denied since she is still taking oxycodone for post operative pain. benzonotate capsules sent instead,     I discussed the assessment and treatment plan with the patient. The patient was provided an opportunity to ask questions and all were answered. The patient agreed with the plan and demonstrated an understanding of the instructions.   The patient was advised to call back or seek an in-person evaluation if the symptoms worsen or if the condition fails to improve as anticipated.   I spent 30 minutes dedicated to the care of this patient on the date of this encounter to include pre-visit  review of his medical history,  Face-to-face time with the patient , and post visit ordering of testing and therapeutics.    Crecencio Mc, MD

## 2021-11-08 NOTE — Telephone Encounter (Signed)
[facility] called and would like to know if Dr Derrel Nip wants both  amoxicillin-clavulanate (AUGMENTIN) 875-125 MG tablet and azithromycin (ZITHROMAX) 500 MG tablet filled at the same time. Phone number is [phone]

## 2021-11-08 NOTE — Telephone Encounter (Signed)
Spoke with pharmacy and informed the pharmacist that per Dr. Derrel Nip verbal both the medications should be filled to be taken at the same time.

## 2021-11-08 NOTE — Progress Notes (Signed)
Tamara [name]

## 2021-11-08 NOTE — Telephone Encounter (Signed)
Per [place] for benzonatate was denied due to plan exclusion - "benzonatate is not a Part D eligible medication and is not covered under Part D".   Routing to [place]

## 2021-11-08 NOTE — Telephone Encounter (Signed)
Per [place] required for benzonatate. KEY [id number]. It came through under the name "Phyliss"   Submitted with the appropriate spelling.

## 2021-11-09 NOTE — Telephone Encounter (Signed)
Then she needs to buy an OTC cough suppressant ,  something that contains dextromethorphan and doxylamine succinate.    I will not prescribe a cough medication with codeine or hydrocodone bc she is taking oxycodone

## 2021-11-09 NOTE — Telephone Encounter (Signed)
Spoke with pt and she stated that she was not able to pick up the benzonatate because it was not covered by insurance to to expensive. She also stated that they do not help her. Pt was advised that she could try an OTC cough suppressant. Pt gave a verbal understanding.

## 2021-11-24 ENCOUNTER — Encounter: Payer: Self-pay | Admitting: Internal Medicine

## 2021-11-27 ENCOUNTER — Telehealth: Payer: Self-pay | Admitting: Internal Medicine

## 2021-11-27 ENCOUNTER — Other Ambulatory Visit: Payer: Self-pay | Admitting: Internal Medicine

## 2021-11-27 NOTE — Telephone Encounter (Signed)
Patient is requesting a refill on her clonazePAM (KLONOPIN) 0.5 MG tablet 

## 2021-11-28 ENCOUNTER — Other Ambulatory Visit: Payer: Self-pay

## 2021-11-28 ENCOUNTER — Other Ambulatory Visit: Payer: Self-pay | Admitting: Family

## 2021-11-28 ENCOUNTER — Emergency Department
Admission: EM | Admit: 2021-11-28 | Discharge: 2021-11-28 | Disposition: A | Discharge status: Home / Self Care | Payer: Medicare Other | Attending: Emergency Medicine | Admitting: Emergency Medicine

## 2021-11-28 DIAGNOSIS — S51851A Open bite of right forearm, initial encounter: Secondary | ICD-10-CM | POA: Diagnosis not present

## 2021-11-28 DIAGNOSIS — S41111A Laceration without foreign body of right upper arm, initial encounter: Secondary | ICD-10-CM | POA: Diagnosis not present

## 2021-11-28 DIAGNOSIS — Z23 Encounter for immunization: Secondary | ICD-10-CM | POA: Insufficient documentation

## 2021-11-28 DIAGNOSIS — S4991XA Unspecified injury of right shoulder and upper arm, initial encounter: Secondary | ICD-10-CM | POA: Diagnosis present

## 2021-11-28 DIAGNOSIS — I2510 Atherosclerotic heart disease of native coronary artery without angina pectoris: Secondary | ICD-10-CM | POA: Diagnosis not present

## 2021-11-28 DIAGNOSIS — J449 Chronic obstructive pulmonary disease, unspecified: Secondary | ICD-10-CM | POA: Diagnosis not present

## 2021-11-28 DIAGNOSIS — S51811A Laceration without foreign body of right forearm, initial encounter: Secondary | ICD-10-CM | POA: Diagnosis not present

## 2021-11-28 DIAGNOSIS — W540XXA Bitten by dog, initial encounter: Secondary | ICD-10-CM | POA: Insufficient documentation

## 2021-11-28 MED ORDER — TETANUS-DIPHTH-ACELL PERTUSSIS 5-2.5-18.5 LF-MCG/0.5 IM SUSY
0.5000 mL | PREFILLED_SYRINGE | Freq: Once | INTRAMUSCULAR | Status: AC
  Administered 2021-11-28: 0.5 mL via INTRAMUSCULAR
  Filled 2021-11-28: qty 0.5

## 2021-11-28 MED ORDER — HYDROCODONE-ACETAMINOPHEN 5-325 MG PO TABS
1.0000 | ORAL_TABLET | Freq: Once | ORAL | Status: AC
  Administered 2021-11-28: 1 via ORAL
  Filled 2021-11-28: qty 1

## 2021-11-28 MED ORDER — HYDROCODONE-ACETAMINOPHEN 5-325 MG PO TABS: 1.0000 | ORAL_TABLET | Freq: Four times a day (QID) | ORAL | 0 refills | Status: DC | PRN

## 2021-11-28 MED ORDER — CLONAZEPAM 0.5 MG PO TABS: 0.5000 mg | ORAL_TABLET | Freq: Every day | ORAL | 0 refills | Status: DC | PRN

## 2021-11-28 MED ORDER — AMOXICILLIN-POT CLAVULANATE 875-125 MG PO TABS: 1.0000 | ORAL_TABLET | Freq: Two times a day (BID) | ORAL | 0 refills | Status: AC

## 2021-11-28 MED ORDER — LIDOCAINE HCL 1 % IJ SOLN
30.0000 mL | Freq: Once | INTRAMUSCULAR | Status: AC
  Administered 2021-11-28: 30 mL
  Filled 2021-11-28: qty 30

## 2021-11-28 NOTE — Telephone Encounter (Signed)
Refilled: 09/18/2021 Last OV: 06/08/2021 Next OV: not scheduled

## 2021-11-28 NOTE — Discharge Instructions (Signed)
Follow-up with your primary care provider or urgent care and approximately 12 days for suture removal.  The Steri-Strips will fall off on their own.  Area was closed loosely so that if it does get infected it can drain.  Return to the emergency department if any severe worsening such as fever or chills.  2 prescriptions were sent to your pharmacy.  1 is Augmentin which prevent infection and the other is the hydrocodone which is for pain.  Be aware that this medication could cause drowsiness and increase your risk for falling.  In 2 days change the dressing on your arm and loosely wrapped with gauze.  If any signs of infection before sutures are to be removed you will need to see your primary care provider sooner.  Elevate your arm tonight to reduce swelling which will help with pain.

## 2021-11-28 NOTE — Telephone Encounter (Signed)
Rx ok'd for clonazepam #30 with no refills.

## 2021-11-28 NOTE — ED Triage Notes (Signed)
Pt comes with c/o right arm dog bite. Pt states her dogs got into a fight and she was caught in the middle. Pt has large open wound noted to arm with bleeding. Pt states she is on blood thinners. Dogs are all caught up on shots per pt.

## 2021-11-28 NOTE — Telephone Encounter (Signed)
Pt is out of medication. Refill was sent to Dr. Derrel Nip.

## 2021-11-28 NOTE — ED Notes (Signed)
Pt reports that her dogs got into a fight and she attempted to break them up, pt thinks her dog's teeth got her right forearm, pt has a large skin tear to the right forearm that is almost completely circumferential, wet saline gauze used to unravel the skin and place the skin back over the open area

## 2021-11-28 NOTE — Telephone Encounter (Signed)
Pt called wanting an update on clonazePAM refill because she cant sleep

## 2021-11-28 NOTE — ED Provider Notes (Signed)
[facility] Provider Note    Event Date/Time   First MD Initiated Contact with Patient 11/28/21 [phone]     (approximate)   History   Animal Bite   HPI  Tamara [name] is a 76 y.o. female presents to the ED after being bitten by her to family dog's (shih tzu) while they were fighting this morning.  Patient states that she stuck her arm down to try to break them up when she was bitten.  Patient reports that dogs are up-to-date on immunizations however she is not sure about her tetanus immunization.  Patient is also on blood thinners which has made her arm bleed more freely.  Patient with history of DVT, chronic migraine, aortic arthrosclerosis, COPD, GERD, leukocytosis, osteoporosis, history of sleep apnea.     Physical Exam   Triage Vital Signs: ED Triage Vitals  Enc Vitals Group     BP 11/28/21 0854 (!) 152/62     Pulse Rate 11/28/21 0854 (!) 114     Resp 11/28/21 0854 18     Temp 11/28/21 0854 98 F (36.7 C)     Temp src --      SpO2 11/28/21 0854 100 %     Weight --      Height --      Head Circumference --      Peak Flow --      Pain Score 11/28/21 0856 10     Pain Loc --      Pain Edu? --      Excl. in [place]? --     Most recent vital signs: Vitals:   11/28/21 0854 11/28/21 1210  BP: (!) 152/62 (!) 144/61  Pulse: (!) 114 97  Resp: 18 20  Temp: 98 F (36.7 C)   SpO2: 100% 99%     General: Awake, no distress.  CV:  Good peripheral perfusion.  Heart regular rate and rhythm. Resp:  Normal effort.  Lungs are clear bilaterally. Abd:  No distention.  Soft, nontender. Other:  On examination of the right forearm there are multiple lacerations and skin tears.  Currently bleeding is controlled with direct pressure.  The proximal forearm is gaping and will require sutures to approximate the edges.  No foreign body was noted.  Patient is able move digits distally.  Motor sensory function intact.  Radial pulse present.   ED Results / Procedures  / Treatments   Labs (all labs ordered are listed, but only abnormal results are displayed) Labs Reviewed - No data to display   PROCEDURES:  Critical Care performed: No  ..Laceration Repair  Date/Time: 11/28/2021 11:05 AM Performed by: Johnn Hai, PA-C Authorized by: Johnn Hai, PA-C   Consent:    Consent obtained:  Verbal   Consent given by:  Patient   Risks discussed:  Pain, infection and poor wound healing Universal protocol:    Patient identity confirmed:  Verbally with patient Anesthesia:    Anesthesia method:  Local infiltration   Local anesthetic:  Lidocaine 1% w/o epi Laceration details:    Location:  Shoulder/arm   Shoulder/arm location:  R lower arm   Length (cm):  6.5 Pre-procedure details:    Preparation:  Patient was prepped and draped in usual sterile fashion Exploration:    Limited defect created (wound extended): no     Hemostasis achieved with:  Direct pressure   Imaging outcome: foreign body not noted     Contaminated: no   Treatment:  Area cleansed with:  Saline   Amount of cleaning:  Extensive   Irrigation volume:  120 ml   Irrigation method:  Syringe and pressure wash   Visualized foreign bodies/material removed: no   Skin repair:    Repair method:  Sutures   Suture size:  4-0   Suture material:  Nylon   Suture technique:  Simple interrupted   Number of sutures:  11 Approximation:    Approximation:  Loose .Marland KitchenLaceration Repair  Date/Time: 11/28/2021 11:05 AM Performed by: Johnn Hai, PA-C Authorized by: Johnn Hai, PA-C   Consent:    Consent obtained:  Verbal   Consent given by:  Patient   Risks discussed:  Infection, pain and poor wound healing Universal protocol:    Patient identity confirmed:  Verbally with patient Anesthesia:    Anesthesia method:  Local infiltration   Local anesthetic:  Lidocaine 1% w/o epi Laceration details:    Location:  Shoulder/arm   Shoulder/arm location:  R lower arm    Length (cm):  2 Pre-procedure details:    Preparation:  Patient was prepped and draped in usual sterile fashion Exploration:    Limited defect created (wound extended): no     Hemostasis achieved with:  Direct pressure   Contaminated: no   Treatment:    Area cleansed with:  Saline   Amount of cleaning:  Extensive   Irrigation solution:  Sterile saline   Irrigation volume:  100 ml   Irrigation method:  Syringe and pressure wash   Visualized foreign bodies/material removed: no     Debridement:  None   Undermining:  None Skin repair:    Repair method:  Sutures   Suture size:  4-0   Suture material:  Nylon   Suture technique:  Simple interrupted   Number of sutures:  2 Approximation:    Approximation:  Loose Repair type:    Repair type:  Simple Post-procedure details:    Dressing:  Non-adherent dressing   Procedure completion:  Tolerated well, no immediate complications Comments:     Patient is aware that some of the area involved is a skin tear and not a true laceration.  She is also aware that the repair was loosely sutured to approximate the edges of the skin to help with healing however this could also get infected and allow for drainage.  Skin tears were Steri-Stripped to approximate edges.   MEDICATIONS ORDERED IN ED: Medications  Tdap (BOOSTRIX) injection 0.5 mL (0.5 mLs Intramuscular Given 11/28/21 0939)  HYDROcodone-acetaminophen (NORCO/VICODIN) 5-325 MG per tablet 1 tablet (1 tablet Oral Given 11/28/21 0938)  lidocaine (XYLOCAINE) 1 % (with pres) injection 30 mL (30 mLs Infiltration Given by Other 11/28/21 0941)     IMPRESSION / MDM / ASSESSMENT AND PLAN / ED COURSE  I reviewed the triage vital signs and the nursing notes.  Differential diagnosis includes, but is not limited to, laceration right forearm, skin tears, dog bite.  76 year old female presents to the ED after her 2 dogs at home bit her this morning.  Patient has multiple lacerations and skin tears involving  her right forearm.  Patient is on blood thinners however bleeding at this time is controlled with direct pressure.  Patient was made aware that this area will be loosely sutured and Steri-Stripped to allow for drainage should this area become infected.  She was placed on Augmentin 875 twice daily for prevention purposes and her Tdap was given while in the emergency department.  Patient was given  Norco while in the ED to help with pain and tolerated procedure well.  She was also given a prescription for Norco to take every 6 hours if needed for pain at home.  She has taken this in the past without any complications for orthopedic reasons.  Patient was made aware that she should be seen if any areas appear to be getting infected.  She is to follow-up with her PCP or urgent care for suture removal in approximately 12 days.      FINAL CLINICAL IMPRESSION(S) / ED DIAGNOSES   Final diagnoses:  Dog bite of right forearm, initial encounter     Rx / DC Orders   ED Discharge Orders          Ordered    amoxicillin-clavulanate (AUGMENTIN) 875-125 MG tablet  2 times daily        11/28/21 1142    HYDROcodone-acetaminophen (NORCO/VICODIN) 5-325 MG tablet  Every 6 hours PRN        11/28/21 1142             Note:  This document was prepared using Dragon voice recognition software and may include unintentional dictation errors.   Johnn Hai, PA-C 11/29/21 1612    Lucrezia Starch, MD 11/29/21 1759

## 2021-11-28 NOTE — Telephone Encounter (Signed)
Pt calling for update for medication

## 2021-11-29 NOTE — Telephone Encounter (Signed)
Pt is aware that medication has been refilled.  °

## 2021-11-29 NOTE — Telephone Encounter (Signed)
Spoke with pt to let her know that the Clonazepam was filled last night and scheduled her for a follow up appt on 12/14/2021.

## 2021-12-03 ENCOUNTER — Encounter: Payer: Self-pay | Admitting: Internal Medicine

## 2021-12-04 ENCOUNTER — Telehealth: Payer: Self-pay | Admitting: Internal Medicine

## 2021-12-04 NOTE — Telephone Encounter (Signed)
Patient is requesting a refill on her HYDROcodone-acetaminophen (NORCO/VICODIN) 5-325 MG tablet from the hospital.

## 2021-12-05 ENCOUNTER — Encounter: Payer: Self-pay | Admitting: Adult Health

## 2021-12-05 ENCOUNTER — Other Ambulatory Visit: Payer: Self-pay | Admitting: Adult Health

## 2021-12-05 MED ORDER — HYDROCODONE-ACETAMINOPHEN 5-325 MG PO TABS: 1.0000 | ORAL_TABLET | Freq: Four times a day (QID) | ORAL | 0 refills | Status: DC | PRN

## 2021-12-05 NOTE — Telephone Encounter (Signed)
Meds ordered this encounter  Medications   HYDROcodone-acetaminophen (NORCO/VICODIN) 5-325 MG tablet    Sig: Take 1 tablet by mouth every 6 (six) hours as needed for moderate pain.    Dispense:  20 tablet    Refill:  0  I did send since she only got 3 day supply on 11/28/21.take as needed. Do not drive with narcotic.  Keep follow up 12/05/21 as scheduled. No further refills until seen. If worsening after hours seek care.

## 2021-12-05 NOTE — Telephone Encounter (Signed)
Pt called and she is aware that hydrocodone has been sent but please make sure that she keeps tomorrows appointment. She was also advised to seek care at ED if any worsening symptoms.

## 2021-12-05 NOTE — Telephone Encounter (Signed)
Pt is scheduled with you tomorrow to have the dog bite wound looked at. Pt was seen in the hospital for this and prescribed hydrocodone for the pain. Pt is out of the medication and would like to know if she can get a refill cause her arm is still hurting.

## 2021-12-05 NOTE — Progress Notes (Signed)
Meds ordered this encounter  Medications   HYDROcodone-acetaminophen (NORCO/VICODIN) 5-325 MG tablet    Sig: Take 1 tablet by mouth every 6 (six) hours as needed for moderate pain.    Dispense:  20 tablet    Refill:  0   See phone call. ED record reviewed 11/28/21 as well as [place] database for controlled substance. 3 day supply was filled at ER.  Has follow up with this provider on 12/06/21.

## 2021-12-06 ENCOUNTER — Encounter: Payer: Self-pay | Admitting: Adult Health

## 2021-12-06 ENCOUNTER — Ambulatory Visit (INDEPENDENT_AMBULATORY_CARE_PROVIDER_SITE_OTHER): Payer: Medicare Other

## 2021-12-06 ENCOUNTER — Ambulatory Visit (INDEPENDENT_AMBULATORY_CARE_PROVIDER_SITE_OTHER): Payer: Medicare Other | Admitting: Adult Health

## 2021-12-06 ENCOUNTER — Telehealth: Payer: Self-pay

## 2021-12-06 ENCOUNTER — Other Ambulatory Visit: Payer: Self-pay

## 2021-12-06 VITALS — BP 122/72 | HR 96 | Temp 97.9°F | Ht 61.0 in | Wt 92.2 lb

## 2021-12-06 DIAGNOSIS — W540XXD Bitten by dog, subsequent encounter: Secondary | ICD-10-CM

## 2021-12-06 DIAGNOSIS — W540XXA Bitten by dog, initial encounter: Secondary | ICD-10-CM | POA: Insufficient documentation

## 2021-12-06 DIAGNOSIS — Z5181 Encounter for therapeutic drug level monitoring: Secondary | ICD-10-CM | POA: Diagnosis not present

## 2021-12-06 DIAGNOSIS — S51811A Laceration without foreign body of right forearm, initial encounter: Secondary | ICD-10-CM | POA: Diagnosis not present

## 2021-12-06 DIAGNOSIS — Z7901 Long term (current) use of anticoagulants: Secondary | ICD-10-CM | POA: Diagnosis not present

## 2021-12-06 DIAGNOSIS — T148XXA Other injury of unspecified body region, initial encounter: Secondary | ICD-10-CM

## 2021-12-06 DIAGNOSIS — S51851A Open bite of right forearm, initial encounter: Secondary | ICD-10-CM | POA: Diagnosis not present

## 2021-12-06 DIAGNOSIS — Z9889 Other specified postprocedural states: Secondary | ICD-10-CM | POA: Diagnosis not present

## 2021-12-06 DIAGNOSIS — S41151A Open bite of right upper arm, initial encounter: Secondary | ICD-10-CM | POA: Diagnosis not present

## 2021-12-06 HISTORY — DX: Bitten by dog, initial encounter: W54.0XXA

## 2021-12-06 LAB — COMPREHENSIVE METABOLIC PANEL
ALT: 26 U/L (ref 0–35)
AST: 32 U/L (ref 0–37)
Albumin: 4.4 g/dL (ref 3.5–5.2)
Alkaline Phosphatase: 48 U/L (ref 39–117)
BUN: 7 mg/dL (ref 6–23)
CO2: 28 mEq/L (ref 19–32)
Calcium: 9.3 mg/dL (ref 8.4–10.5)
Chloride: 104 mEq/L (ref 96–112)
Creatinine, Ser: 0.68 mg/dL (ref 0.40–1.20)
GFR: 85.99 mL/min (ref >60.00)
Glucose, Bld: 96 mg/dL (ref 70–99)
Potassium: 4.3 mEq/L (ref 3.5–5.1)
Sodium: 137 mEq/L (ref 135–145)
Total Bilirubin: 0.4 mg/dL (ref 0.2–1.2)
Total Protein: 6.3 g/dL (ref 6.0–8.3)

## 2021-12-06 LAB — PROTIME-INR
INR: 3.1 ratio — ABNORMAL HIGH (ref 0.8–1.0)
Prothrombin Time: 31.9 s — ABNORMAL HIGH (ref 9.6–13.1)

## 2021-12-06 LAB — CBC WITH DIFFERENTIAL/PLATELET
Basophils Absolute: 0 10*3/uL (ref 0.0–0.1)
Basophils Relative: 0.5 % (ref 0.0–3.0)
Eosinophils Absolute: 0.1 10*3/uL (ref 0.0–0.7)
Eosinophils Relative: 1.3 % (ref 0.0–5.0)
HCT: 41.8 % (ref 36.0–46.0)
Hemoglobin: 13.7 g/dL (ref 12.0–15.0)
Lymphocytes Relative: 53.6 % — ABNORMAL HIGH (ref 12.0–46.0)
Lymphs Abs: 4.5 10*3/uL — ABNORMAL HIGH (ref 0.7–4.0)
MCHC: 32.9 g/dL (ref 30.0–36.0)
MCV: 100.6 fl — ABNORMAL HIGH (ref 78.0–100.0)
Monocytes Absolute: 0.8 10*3/uL (ref 0.1–1.0)
Monocytes Relative: 9.7 % (ref 3.0–12.0)
Neutro Abs: 3 10*3/uL (ref 1.4–7.7)
Neutrophils Relative %: 34.9 % — ABNORMAL LOW (ref 43.0–77.0)
Platelets: 234 10*3/uL (ref 150.0–400.0)
RBC: 4.15 Mil/uL (ref 3.87–5.11)
RDW: 14.2 % (ref 11.5–15.5)
WBC: 8.5 10*3/uL (ref 4.0–10.5)

## 2021-12-06 IMAGING — MR MR KNEE*L* W/O CM
7 series · 40 of 40 positions shown · non-contrast
Comparison: X-ray 06/19/2017, MRI 04/29/2012

CLINICAL DATA: Left knee pain for 1 week

EXAM:
MRI OF THE LEFT KNEE WITHOUT CONTRAST
TECHNIQUE: Multiplanar, multisequence MR imaging of the knee was performed. No
intravenous contrast was administered.

[Series 12: T2 fat-sat · axial · left · 4.0mm · 0.50mm/px · z∈[-103,+22]mm · 5 of 26 slices shown (1 of 3)]
[im 1/26]
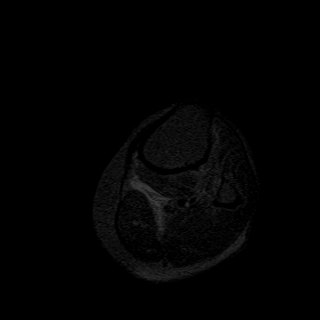
[im 7/26]
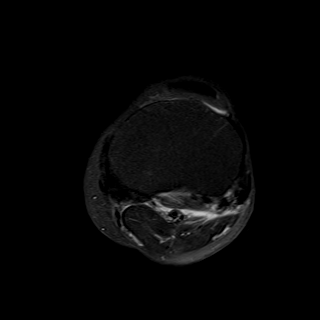
[im 13/26]
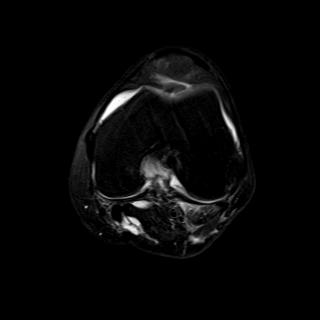
[im 19/26]
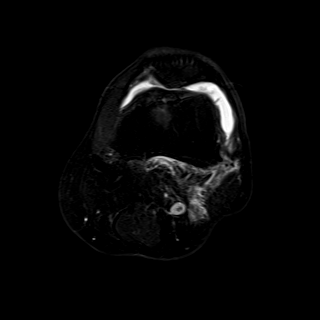
[im 26/26]
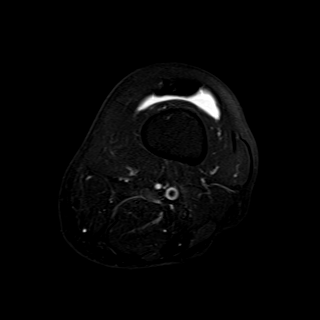

[Series 13: T2 fat-sat · coronal · left · 4.0mm · 0.59mm/px · 5 of 24 slices shown (2 of 3)]
[im 1/24]
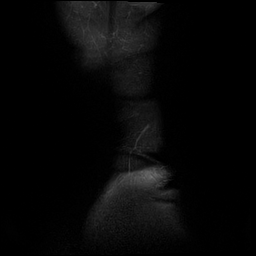
[im 6/24]
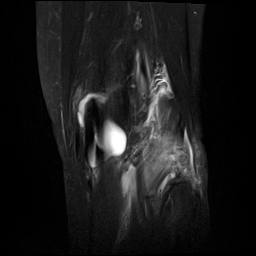
[im 12/24]
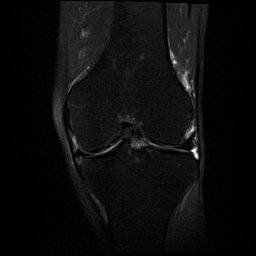
[im 18/24]
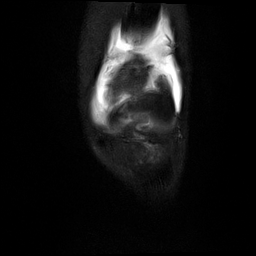
[im 24/24]
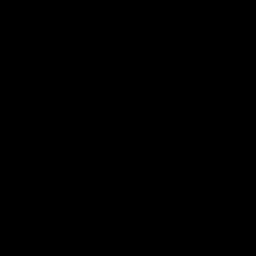

[Series 14: T1 · coronal · left · 4.0mm · 0.59mm/px · 6 of 24 slices shown]
[im 1/24]
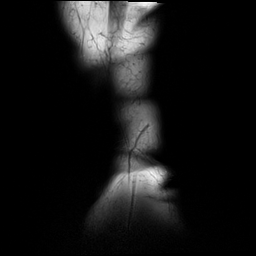
[im 5/24]
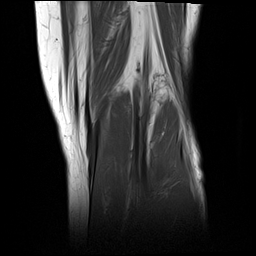
[im 10/24]
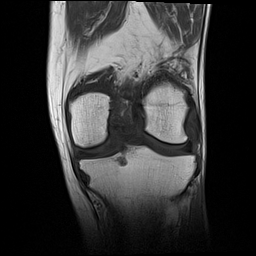
[im 14/24]
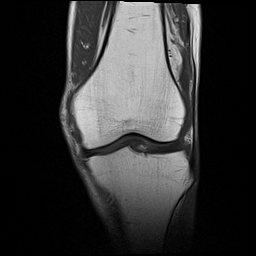
[im 19/24]
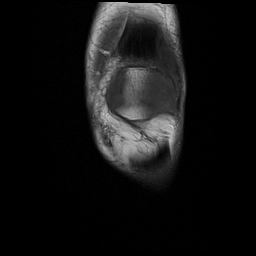
[im 24/24]
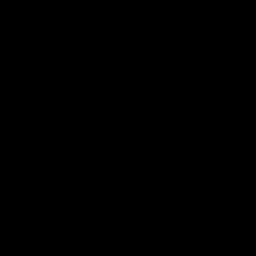

[Series 15: PD fat-sat · coronal · left · 4.0mm · 0.59mm/px · 6 of 24 slices shown (1 of 2)]
[im 1/24]
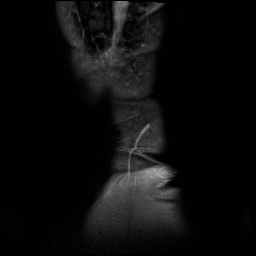
[im 5/24]
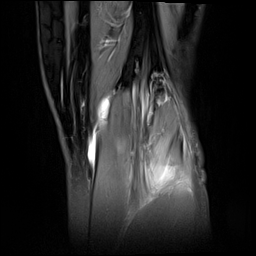
[im 10/24]
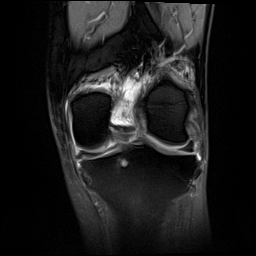
[im 14/24]
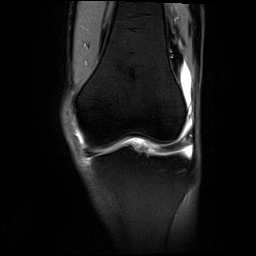
[im 19/24]
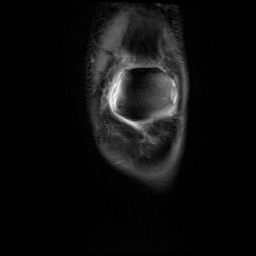
[im 24/24]
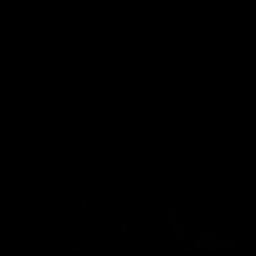

[Series 16: PD fat-sat · sagittal · left · 3.0mm · 0.59mm/px · 7 of 28 slices shown (2 of 2)]
[im 1/28]
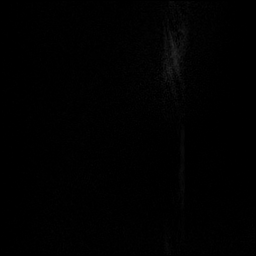
[im 5/28]
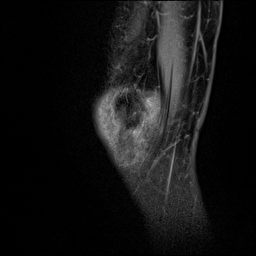
[im 10/28]
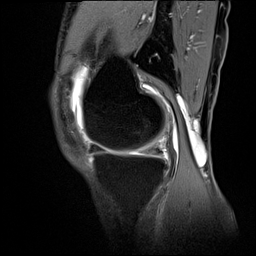
[im 14/28]
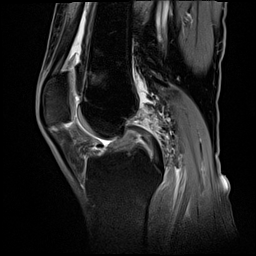
[im 19/28]
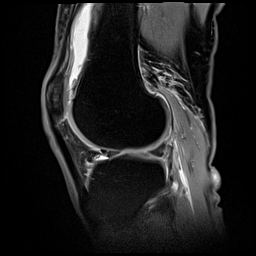
[im 23/28]
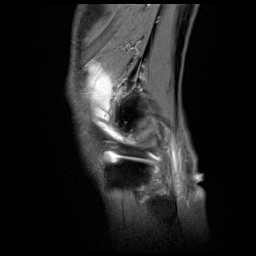
[im 28/28]
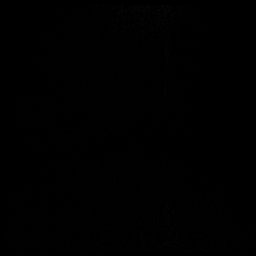

[Series 17: T2 fat-sat · sagittal · left · 3.0mm · 0.59mm/px · 7 of 28 slices shown (3 of 3)]
[im 1/28]
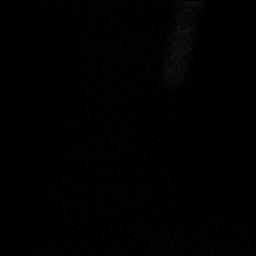
[im 5/28]
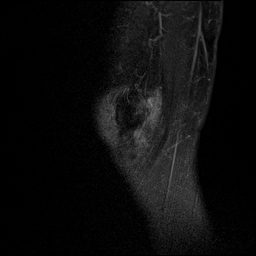
[im 10/28]
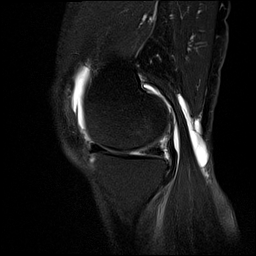
[im 14/28]
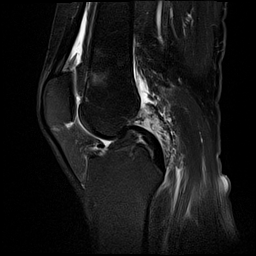
[im 19/28]
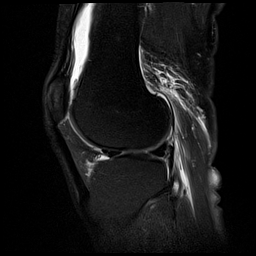
[im 23/28]
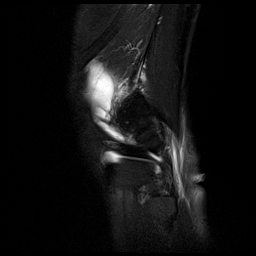
[im 28/28]
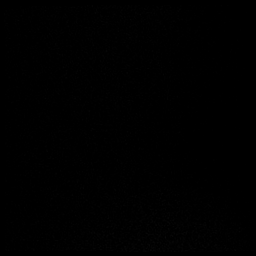

[Series 18: PD · oblique · left · 2.0mm · 0.47mm/px · 4 of 16 slices shown]
[im 1/16]
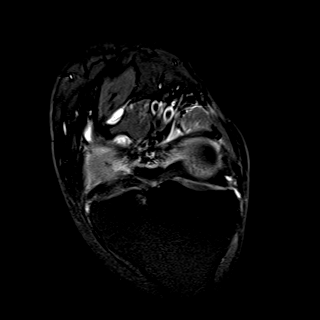
[im 6/16]
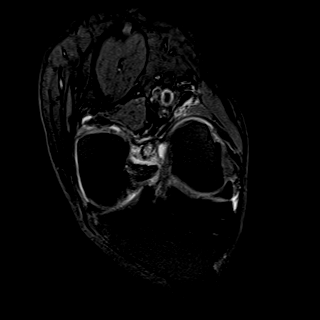
[im 11/16]
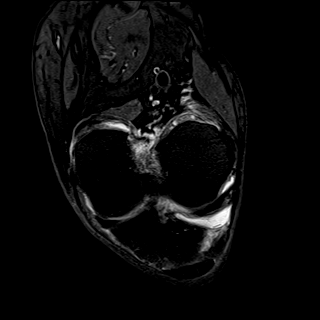
[im 16/16]
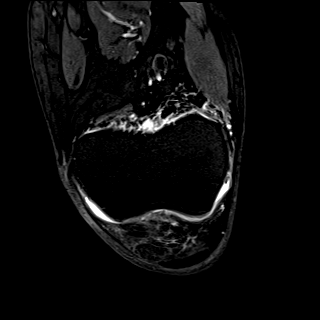

[40 of 40 positions shown; findings below may reference images not displayed]

FINDINGS: MENISCI

Medial meniscus: Intrasubstance degeneration with irregular oblique
tear extending to the inferior articular surface of the body segment
(series 16, image 9). Additional irregular degenerative appearing
tear involving the inferior aspect of the mid body (series 15, image
12; series 16, image 6).

Lateral meniscus: Mild intermediate intrasubstance signal within the
posterior horn of the lateral meniscus suggesting intrasubstance
degeneration. No evidence of lateral meniscal tear.

LIGAMENTS

Cruciates:  Intact ACL and PCL.

Collaterals: Medial collateral ligament is intact. Lateral
collateral ligament complex is intact.

CARTILAGE

Patellofemoral: Mild surface irregularity of the lateral patellar
facet without focal chondral defect. Trochlear cartilage intact.

Medial: Mild chondral thinning and surface irregularity of the
lateral portion of the medial femoral condyle near the intercondylar
notch.

Lateral:  No chondral defect.

Joint: Small to moderate knee joint effusion. Mild edema within
Hoffa's fat.

Popliteal Fossa:  Small Baker's cyst.  Intact popliteus tendon.

Extensor Mechanism: Intact quadriceps tendon and patellar tendon.
Mild distal quadriceps tendinosis.

Bones: Small subcortical cyst or intraosseous ganglion near the PCL
attachment of the posterior tibia. No acute fracture. No
malalignment. No suspicious bone lesion.

Other: Intramuscular edema within the lateral head of the
gastrocnemius muscle and popliteus muscle.
IMPRESSION: 1. Degenerative tearing of the medial meniscus as above.
2. Small to moderate knee joint effusion and small Baker's cyst.
3. Mild medial and patellofemoral compartment chondral
irregularities, not significantly progressed from prior MRI.
4. Intramuscular edema within the lateral head of the gastrocnemius
muscle and popliteus muscle, which may reflect muscle strain.

## 2021-12-06 MED ORDER — AMOXICILLIN-POT CLAVULANATE 875-125 MG PO TABS: 1.0000 | ORAL_TABLET | Freq: Two times a day (BID) | ORAL | 0 refills | Status: DC

## 2021-12-06 MED ORDER — NORTRIPTYLINE HCL 25 MG PO CAPS: 25.0000 mg | ORAL_CAPSULE | Freq: Every day | ORAL | 1 refills | Status: DC

## 2021-12-06 MED ORDER — CYANOCOBALAMIN 1000 MCG/ML IJ SOLN: 1000.0000 ug | INTRAMUSCULAR | 2 refills | Status: DC

## 2021-12-06 MED ORDER — OXYCODONE HCL 5 MG PO TABS: 5.0000 mg | ORAL_TABLET | Freq: Four times a day (QID) | ORAL | 0 refills | Status: AC | PRN

## 2021-12-06 NOTE — Telephone Encounter (Signed)
I spoke with Pam from [facility] & cancelled the prescription for the hydrocodone sent yesterday.

## 2021-12-06 NOTE — Progress Notes (Signed)
Recent dog bite. CBC is stable.  CMP within normal limits.  Patient reported starting Warfarin  back on 15 days post op from lumbar surgery that was  performed  11/06/22 and is currently taking 7 mg total daily.  She is currently on 5 more days of antibiotics, with this would recommend that she hold one dose of warfarin  and then resume normal dose of 7 mg of warfarin by mouth once daily recheck PT/INR next week at follow up with Dr. Derrel Nip scheduled. Report any concerns with wound as discussed. INR goal 2-3 and INR 3.1 on 12/06/21

## 2021-12-06 NOTE — Assessment & Plan Note (Signed)
Laceration / dog bite, sutured in ER, signs of infection and wound care, cleanse with normal saline, non stick gauze, with padding and ace wrap. Will not use Tega- derm given her this skin, non stick dressings only. Discussed signs of infection.  Recheck next Monday or Tuesday in office also has visit with Dr. Derrel Nip next Friday.  Continue Augmentin as prescribed 5 more days as high risk for infection with open skin.  Right forearm x ray in office today.

## 2021-12-06 NOTE — Telephone Encounter (Signed)
Pam from [facility] callback. Pam stated that Judson Roch had called her to cancel script for medication (hydrocodone). Pam stated that the Pt medication was already pickup. Pam is requesting callback at [phone]

## 2021-12-06 NOTE — Assessment & Plan Note (Signed)
12/06/21 INR is 3.1 today she is on 5 more days of Augmentin from today's office visit, given this  advise she hold one day of warfarin, and then resume her normal schedule of warfarin 7 mg once daily that she has been on since around 11/22/20 she was on 7 days of antibiotics prior to today's INR check  And advise  recheck next week at follow up with Dr. Derrel Nip. Patient to call if wound bleeding more.

## 2021-12-06 NOTE — Assessment & Plan Note (Signed)
Skin frail and thin, dog bite while her two small dogs were fighting, advised not to pull or pick at skin flaps.  Doing to have surgery consult on her arm, given thin skin may have difficulty healing.

## 2021-12-06 NOTE — Patient Instructions (Addendum)
Meds ordered this encounter  Medications   amoxicillin-clavulanate (AUGMENTIN) 875-125 MG tablet    Sig: Take 1 tablet by mouth 2 (two) times daily.    Dispense:  10 tablet    Refill:  0   oxyCODONE (ROXICODONE) 5 MG immediate release tablet    Sig: Take 1 tablet (5 mg total) by mouth every 6 (six) hours as needed for up to 7 days for severe pain.    Dispense:  28 tablet    Refill:  0    Medications Discontinued During This Encounter  Medication Reason   HYDROcodone-acetaminophen (NORCO/VICODIN) 5-325 MG tablet Completed Course   oxyCODONE (ROXICODONE) 5 MG immediate release tablet Reorder    Do not take Hydrocodone discontinues. Oxycodone only as prescribed for moderate to severe pain.  Watch for any signs of infection.   Wound care- washed with sterile saline, non stick gauze and padding with ace bandage over. Recheck next Monday or Tuesday.  X ray and labs today.    Laceration Care, Adult A laceration is a cut that may go through all layers of the skin and into the tissue that is right under the skin. Some lacerations heal on their own. Others need to be closed with stitches (sutures), staples, skin adhesive strips, or skin glue. Proper care of a laceration reduces the risk for infection, helps the laceration heal better, and may prevent scarring. General tips Keep the wound clean and dry. Do not scratch or pick at the wound. Wash your hands with soap and water for at least 20 seconds before and after touching your wound or changing your bandage (dressing). If soap and water are not available, use hand sanitizer. Do not usedisinfectants or antiseptics, such as rubbing alcohol, to clean your wound unless told by your health care provider. If you were given a dressing, you should change it at least once a day, or as told by your health care provider. You should also change it if it becomes wet or dirty. How to care for your laceration If sutures or staples were used: Keep the wound  completely dry for the first 24 hours, or as told by your health care provider. After that time, you may shower or bathe. Do not soak your wound in water until after the sutures or staples have been removed. Clean the wound once each day, or as told by your health care provider. To do this: Wash the wound with soap and water. Rinse the wound with water to remove all soap. Pat the wound dry with a clean towel. Do not rub the wound. After cleaning the wound, apply a thin layer of antibiotic ointment, other topical ointments, or a non-adherent dressing as told by your health care provider. This will help prevent infection and keep the dressing from sticking to the wound. Have the sutures or staples removed as told by your health care provider. Do not  remove sutures or staples yourself. If skin adhesive strips were used: Do not get the skin adhesive strips wet. You may shower or bathe, but keep the wound dry. If the wound gets wet, pat it dry with a clean towel. Do not rub the wound. Skin adhesive strips fall off on their own. If adhesive strip edges start to loosen and curl up, you may trim the loose edges. Do not remove adhesive strips completely unless your health care provider tells you to do that. If skin glue was used: You may shower or bathe, but try to keep the wound  dry. Do not soak the wound in water. After showering or bathing, pat the wound dry with a clean towel. Do not rub the wound. Do not do any activities that will make you sweat a lot until the skin glue has fallen off. Do not apply liquid, cream, or ointment medicine to the wound while the skin glue is in place. Doing this may loosen the film before the wound has healed. If a dressing is placed over the wound, do not apply tape directly over the skin glue. Doing this may cause the glue to be pulled off before the wound has healed. Do not pick at the glue. Skin glue usually remains in place for 5-10 days and then falls off the  skin. Follow these instructions at home: Medicines Take over-the-counter and prescription medicines only as told by your health care provider. If you were prescribed an antibiotic medicine or ointment, take or apply it as told by your health care provider. Do not stop using it even if your condition improves. Managing pain and swelling If directed, put ice on the injured area. To do this: Put ice in a plastic bag. Place a towel between your skin and the bag. Leave the ice on for 20 minutes, 2-3 times a day. Remove the ice if your skin turns bright red. This is very important. If you cannot feel pain, heat, or cold, you have a greater risk of damage to the area. Raise (elevate) the injured area above the level of your heart while you are sitting or lying down for the first 24-48 hours after the laceration is repaired. General instructions  Avoid any activity that could cause your wound to reopen. Check your wound every day for signs of infection. Watch for: More redness, swelling, or pain. Fluid or blood. Warmth. Pus or a bad smell. Keep all follow-up visits. This is important. Contact a health care provider if: You received a tetanus shot and you have swelling, severe pain, redness, or bleeding at the injection site. Your closed wound breaks open. You have any of these signs of infection: More redness, swelling, or pain around your wound. Fluid or blood coming from your wound. Warmth coming from your wound. Pus or a bad smell coming from your wound. A fever. You notice something coming out of the wound, such as wood or glass. Your pain is not controlled with medicine. You notice a change in the color of your skin near your wound. You need to change the dressing often. You develop a new rash. You have numbness around the wound. Get help right away if: You develop severe swelling around the wound. Your pain suddenly increases and is severe. You develop painful lumps near the  wound or on skin anywhere else on your body. You have a red streak going away from your wound. The wound is on your hand or foot, and you cannot properly move a finger or toe. The wound is on your hand or foot, and you notice that your fingers or toes look pale or bluish. Summary A laceration is a cut that may go through all layers of the skin and into the tissue that is right under the skin. Some lacerations heal on their own. Others need to be closed with stitches (sutures), staples, skin adhesive strips, or skin glue. Proper care of a laceration reduces the risk of infection, helps the laceration heal better, and may prevent scarring. This information is not intended to replace advice given to you by your  health care provider. Make sure you discuss any questions you have with your health care provider. Document Revised: 01/04/2021 Document Reviewed: 01/04/2021 Elsevier Patient Education  2022 [place]. Skin Tear A skin tear is a wound in which the top layers of skin peel off. This is a common problem for older people. It can also be a problem for people who take certain medicines for too long. To repair the skin, your doctor may use: Tape. Skin tape (adhesive) strips. A bandage (dressing) may also be placed over the tape or skin tape strips. Follow these instructions at home: Keep your wound clean Clean the wound as told by your doctor. You may be told to keep the wound dry for the first few days. If you are told to clean the wound: Wash the wound with mild soap and water, a wound cleanser, or a salt-water (saline) solution. If you use soap, rinse the wound with water to get all the soap off. Do not rub the wound dry. Pat the wound gently with a clean towel or let it air-dry. Change any bandage as told by your doctor. This includes changing the bandage if it gets wet, gets dirty, or starts to smell bad. To change your bandage: Wash your hands with soap and water for at least 20  seconds before and after you change your bandage. If you cannot use soap and water, use hand sanitizer. Leave tape or skin tape strips alone unless you are told to take them off. You may trim the edges of the tape strips if they curl up. Watch for signs of infection Check your wound every day for signs of infection. Check for: Redness, swelling, or pain. More fluid or blood. Warmth. Pus or a bad smell.  Protect your wound Do not scratch or pick at the wound. Protect the injured area until it has healed. Medicines Take or apply over-the-counter and prescription medicines only as told by your doctor. If you were prescribed an antibiotic medicine, take or apply it as told by your doctor. Do not stop using it even if your condition gets better. General instructions  Keep the bandage dry. Do not take baths, swim, use a hot tub, or do anything that puts your wound underwater. Ask your doctor about taking showers or sponge baths. Keep all follow-up visits. Contact a doctor if: You have any of these signs of infection in your wound: Redness, swelling, or pain. More fluid or blood. Warmth. Pus or a bad smell. Get help right away if: You have a red streak that goes away from the skin tear. You have a fever and chills, and your symptoms get worse all of a sudden. Summary A skin tear is a wound in which the top layers of skin peel off. To repair the skin, your doctor may use tape or skin tape strips. Change any bandage as told by your doctor. Take or apply over-the-counter and prescription medicines only as told by your doctor. Contact a doctor if you have signs of infection. This information is not intended to replace advice given to you by your health care provider. Make sure you discuss any questions you have with your health care provider. Document Revised: 02/02/2020 Document Reviewed: 02/02/2020 Elsevier Patient Education  2022 [place], Adult Animal bites range  from mild to serious. An animal bite can result in any of these injuries: A scratch. A deep, open cut. A puncture of the skin. A crush injury. Tearing away of the skin  or a body part. A bone injury. A small bite from a house pet is usually less serious than a bite from a stray or wild animal, such as a raccoon, fox, skunk, or bat. That is because stray and wild animals have a higher risk of carrying a serious infection called rabies, which can be passed to humans through a bite. What increases the risk? You are more likely to be bitten by an animal if: You are around unfamiliar pets. You disturb an animal when it is eating, sleeping, or caring for its babies. You are outdoors in a place where small, wild animals roam freely. What are the signs or symptoms? Common symptoms of an animal bite include: Pain. Bleeding. Swelling. Bruising. How is this diagnosed? This condition may be diagnosed based on a physical exam and medical history. Your health care provider will examine your wound and ask for details about the animal and how the bite happened. You may also have tests, such as: Blood tests to check for infection. X-rays to check for damage to bones or joints. Taking a fluid sample from your wound and checking it for infection (culture test). How is this treated? Treatment varies depending on the type of animal, where the bite is on your body, and your medical history. Treatment may include: Caring for the wound. This often includes cleaning the wound, rinsing out (flushing) the wound with saline solution, and applying a bandage (dressing). In some cases, the wound may be closed with stitches (sutures), staples, skin glue, or adhesive strips. Antibiotic medicine to prevent or treat infection. This medicine may be prescribed in pill or ointment form. If the bite area becomes infected, the medicine may be given through an IV. A tetanus shot to prevent tetanus infection. Rabies treatment to  prevent rabies infection. This will be done if the animal could have rabies. Surgery. This may be done if a bite gets infected or if there is damage that needs to be repaired. Follow these instructions at home: Wound care  Follow instructions from your health care provider about how to take care of your wound. Make sure you: Wash your hands with soap and water before you change your dressing. If soap and water are not available, use hand sanitizer. Change your dressing as told by your health care provider. Leave sutures, skin glue, or adhesive strips in place. These skin closures may need to stay in place for 2 weeks or longer. If adhesive strip edges start to loosen and curl up, you may trim the loose edges. Do not remove adhesive strips completely unless your health care provider tells you to do that. Check your wound every day for signs of infection. Check for: More redness, swelling, or pain. More fluid or blood. Warmth. Pus or a bad smell. Medicines Take or apply over-the-counter and prescription medicines only as told by your health care provider. If you were prescribed an antibiotic, take or apply it as told by your health care provider. Do not stop using the antibiotic even if your condition improves. General instructions  Keep the injured area raised (elevated) above the level of your heart while you are sitting or lying down, if this is possible. If directed, put ice on the injured area. Put ice in a plastic bag. Place a towel between your skin and the bag. Leave the ice on for 20 minutes, 2-3 times per day. Keep all follow-up visits as told by your health care provider. This is important. Contact a health care  provider if: You have more redness, swelling, or pain around your wound. Your wound feels warm to the touch. You have a fever or chills. You have a general feeling of sickness (malaise). You feel nauseous or you vomit. You have pain that does not get better. Get help  right away if: You have a red streak that leads away from your wound. You have non-clear fluid or more blood coming from your wound. There is pus or a bad smell coming from your wound. You have trouble moving your injured area. You have numbness or tingling that extends beyond the wound. Summary Animal bites can range from mild to serious. An animal bite can cause a scratch on the skin, a deep open cut, a puncture of the skin, a crush injury, tearing away of the skin or a body part, or a bone injury. Your health care provider will examine your wound and ask for details about the animal and how the bite happened. You may also have tests such as a blood test, X-ray, or testing of a fluid sample from your wound (culture test). Treatment may include wound care, antibiotic medicine, a tetanus shot, and rabies treatment if the animal could have rabies. This information is not intended to replace advice given to you by your health care provider. Make sure you discuss any questions you have with your health care provider. Document Revised: 08/22/2020 Document Reviewed: 08/22/2020 Elsevier Patient Education  2022 [employer].

## 2021-12-06 NOTE — Progress Notes (Signed)
New Patient Office Visit  Subjective:  Patient ID: CAROLEANN CASLER, female    DOB: 12-18-1945  Age: 76 y.o. MRN: [id number]  CC:  Chief Complaint  Patient presents with   Animal Bite    HPI CHELLY DOMBECK presents for follow-up from ER visit where she had a dog bite, was seen 11/28/2021.  Patient is a 76 year old female that went to the ED on 11/28/2021 for being bitten by her family's dogs ( shih tzu)patient stated while they were fighting. These were both her dogs vaccinations up to date  and Rabies series was not initiated at ER.   Finished Augmentin 7 days of antibiotics yesterday. TDAP was given in ER. Dogs were personal dogs and both vaccinated.   On coumadin 7 mg once daily per patient, this is not on medication list as active. Will need to be added back. She restarted 15 days after her surgery with neurosurgeon 11/06/21 was surgery date so started around 11/23/21. Denies any leg pain, calf pain, shortness of breath, INR goal 2- 3 for history of DVT in past.  Denies any other bleeding besides wound at times.  Has had follow up scheduled seen [facility] neurosurgery for follow up on 11/20/21 and doing well, denies any change since that visit in her back.  Right lower forearm is still very tender and painful she had multiple lacerations and skin tears repaired in the Emergency room. Has had mild bleeding from site, no odor, no other drainage, no warmth.   She requested Hydrocodone refill 5-325 yesterday 12/05/21 and since but not taking as did not help pain with the dog bite she reports, I do not see today in [facility] where she picked up script sent on 12/05/21.Requesting oxycodone 10 mg, will give her 5 mg oxycodone and she agrees not to take any Hydrocodone.   Patient  denies any fever,chills, rash, chest pain, shortness of breath, nausea, vomiting, or diarrhea.  Denies dizziness, lightheadedness, pre syncopal or syncopal episodes.   Past Medical History:  Diagnosis Date   Aortic  atherosclerosis ([place])    Arthritis    B12 deficiency    Bowel obstruction ([place])    s/p laparotomy/ LOA 3/08, prior reversal of jejunal bypasss 2004   Carpal tunnel syndrome    Chronic abdominal pain    Clostridium difficile infection 05/24/2021   COPD (chronic obstructive pulmonary disease) ([place])    no inhalers   Crohn's disease ([place])    remission 2016   Depression    DVT (deep venous thrombosis) ([place]) 04/24/2018   DVT (deep venous thrombosis) ([place]) 05/2021   left leg   Dysphagia 12/04/2017   Facial twitching 07/11/2018   GERD (gastroesophageal reflux disease)    Headache    History of DVT (deep vein thrombosis) 01/2007   post operative right leg, s/p vena cava filter   History of hiatal hernia    History of kidney stones    Intractable chronic migraine without aura and without status migrainosus 05/20/2019   Leukocytosis    Leukocytosis    Osteoporosis    Pneumonia    Sleep apnea    Small bowel obstruction ([place])    2001, 2004    Past Surgical History:  Procedure Laterality Date   ABDOMINAL HYSTERECTOMY     ANAL SPHINCTER PROSTHESIS PLACEMENT     APPENDECTOMY     CARPAL TUNNEL RELEASE Right 04/22/2014   CATARACT EXTRACTION     CERVICAL FUSION  2005   C6-7, anterior approach   CHOLECYSTECTOMY  COLONOSCOPY  2015   COLONOSCOPY     2005, 2007, 2010, 2015, 2020, 2021   COLONOSCOPY WITH PROPOFOL N/A 05/10/2019   Procedure: COLONOSCOPY WITH PROPOFOL;  Surgeon: Manya Silvas, MD;  Location[facility] ENDOSCOPY;  Service: Endoscopy;  Laterality: N/A;   ESOPHAGEAL MANOMETRY N/A 02/11/2018   Procedure: ESOPHAGEAL MANOMETRY (EM);  Surgeon: Lucilla Lame, MD;  Location: [facility] ENDOSCOPY;  Service: Endoscopy;  Laterality: N/A;   ESOPHAGOGASTRODUODENOSCOPY     1996, 2006, 2008, 2010, 2014, 2018, 2021   ESOPHAGOGASTRODUODENOSCOPY (EGD) WITH PROPOFOL N/A 12/17/2017   Procedure: ESOPHAGOGASTRODUODENOSCOPY (EGD) WITH PROPOFOL;  Surgeon: Robert Bellow, MD;  Location: [facility]  ENDOSCOPY;  Service: Endoscopy;  Laterality: N/A;   ESOPHAGOGASTRODUODENOSCOPY (EGD) WITH PROPOFOL N/A 01/08/2018   Procedure: ESOPHAGOGASTRODUODENOSCOPY (EGD) WITH PROPOFOL;  Surgeon: Robert Bellow, MD;  Location: [facility] ENDOSCOPY;  Service: Endoscopy;  Laterality: N/A;   EXCISION NEUROMA Left 09/18/2021   Procedure: Excision of traumatic neuroma infrapatellar branch saphenous nerve left knee.;  Surgeon: Corky Mull, MD;  Location: [facility] ORS;  Service: Orthopedics;  Laterality: Left;   gyn surgery     hysterectomy   INSERTION OF VENA CAVA FILTER Right    KNEE ARTHROSCOPY WITH MEDIAL MENISECTOMY Left 06/22/2020   Procedure: Left knee arthroscopic partial medial meniscectomy;  Surgeon: Leim Fabry, MD;  Location: [place];  Service: Orthopedics;  Laterality: Left;   KNEE ARTHROSCOPY WITH MEDIAL MENISECTOMY Left 05/25/2021   Procedure: Left knee arthroscopy, infrapatellar fat pad excision;  Surgeon: Leim Fabry, MD;  Location: [facility] ORS;  Service: Orthopedics;  Laterality: Left;   LAPAROTOMY  01/30/2007   for bowel obstruction with LOA   LUMBAR LAMINECTOMY/DECOMPRESSION MICRODISCECTOMY N/A 12/25/2016   Procedure: LUMBAR DECOMPRESSION L4-5;  Surgeon: Meade Maw, MD;  Location: [facility] ORS;  Service: Neurosurgery;  Laterality: N/A;   NISSEN FUNDOPLICATION     ROTATOR CUFF REPAIR Bilateral    SPLENECTOMY     secondary to colonoscopy   TONSILLECTOMY AND ADENOIDECTOMY     TRIGGER FINGER RELEASE Left 08/25/2015   TRIGGER FINGER RELEASE Right 09/18/2015   VAGINAL HYSTERECTOMY      Family History  Problem Relation Age of Onset   Diabetes Mother        type 2   Heart disease Mother    Hypertension Mother    Coronary artery disease Father    Heart attack Sister     Social History   Socioeconomic History   Marital status: Widowed    Spouse name: Not on file   Number of children: 3   Years of education: Not on file   Highest education level: Not on file  Occupational  History   Not on file  Tobacco Use   Smoking status: Every Day    Packs/day: 1.00    Years: 50.00    Pack years: 50.00    Types: Cigarettes   Smokeless tobacco: Never  Vaping Use   Vaping Use: Never used  Substance and Sexual Activity   Alcohol use: No    Alcohol/week: 0.0 standard drinks   Drug use: No   Sexual activity: Not on file  Other Topics Concern   Not on file  Social History Narrative   Three sons   Social Determinants of Health   Financial Resource Strain: Not on file  Food Insecurity: Not on file  Transportation Needs: Not on file  Physical Activity: Not on file  Stress: Not on file  Social Connections: Not on file  Intimate Partner Violence: Not on  file    ROS Review of Systems  Constitutional: Negative.   HENT: Negative.    Respiratory: Negative.    Cardiovascular: Negative.   Gastrointestinal: Negative.   Genitourinary: Negative.   Musculoskeletal:  Positive for arthralgias and back pain.  Skin:  Positive for color change and wound.  Neurological:  Negative for weakness, light-headedness and headaches.  Psychiatric/Behavioral: Negative.     Objective:   Today's Vitals: BP 122/72 (BP Location: Left Arm, Patient Position: Sitting, Cuff Size: Normal)    Pulse 96    Temp 97.9 F (36.6 C) (Oral)    Ht 5' 1"  (1.549 m)    Wt 92 lb 3.2 oz (41.8 kg)    SpO2 94%    BMI 17.42 kg/m   Physical Exam Vitals reviewed.  Constitutional:      Appearance: Normal appearance.     Comments: Thin body habitus   HENT:     Head: Normocephalic.  Cardiovascular:     Rate and Rhythm: Normal rate and regular rhythm.     Pulses: Normal pulses.     Heart sounds: Normal heart sounds.  Pulmonary:     Effort: Pulmonary effort is normal. No respiratory distress.     Breath sounds: Normal breath sounds. No stridor. No wheezing, rhonchi or rales.  Chest:     Chest wall: No tenderness.  Abdominal:     Palpations: Abdomen is soft.  Musculoskeletal:     Cervical back:  Normal range of motion. No tenderness.  Lymphadenopathy:     Cervical: No cervical adenopathy.  Skin:    Findings: Erythema, signs of injury, laceration and wound present.          Comments: See media below: right forearm from today's visit follow up from Emergency room repair of lacerations and multiple skin tears / dog bite.  Mild bloody drainage on gauze and at wound.  2+ radial pulse.  Bruising notes.  Sutures intact.  Skin is very frail and thin.  No warmth or signs of spreading infection.  Painful.   Neurological:     Mental Status: She is oriented to person, place, and time.     Media Information Document Information  Photos  Right forearm dog bite 12/06/21 office visit   12/06/2021 08:11  Attached To:  Office Visit on 12/06/21 with Buffy Ehler, Kelby Aline, FNP   Source Information  Keleigh Kazee, Kelby Aline, FNP   [facility]    Media Information Document Information  Photos  Right forearm dog bite office visits 12/06/21  12/06/2021 08:10  Attached To:  Office Visit on 12/06/21 with Michele Judy, Kelby Aline, FNP   Source Information  Rateel Beldin, Kelby Aline, FNP   [facility]   Assessment & Plan:   Problem List Items Addressed This Visit       Musculoskeletal and Integument   Multiple skin tears    Skin frail and thin, dog bite while her two small dogs were fighting, advised not to pull or pick at skin flaps.  Doing to have surgery consult on her arm, given thin skin may have difficulty healing.      Relevant Orders   Ambulatory referral to Orthopedic Surgery   Ambulatory referral to General Surgery     Other   History of repair of laceration    Laceration / dog bite, sutured in ER, signs of infection and wound care, cleanse with normal saline, non stick gauze, with padding and ace wrap. Will not use Tega- derm given her this skin, non stick dressings only.  Discussed signs of infection.  Recheck next Monday or Tuesday in office also has visit with Dr. Derrel Nip  next Friday.  Continue Augmentin as prescribed 5 more days as high risk for infection with open skin.  Right forearm x ray in office today.       Anticoagulated on Coumadin    12/06/21 INR is 3.1 today she is on 5 more days of Augmentin from today's office visit, given this  advise she hold one day of warfarin, and then resume her normal schedule of warfarin 7 mg once daily that she has been on since around 11/22/20 she was on 7 days of antibiotics prior to today's INR check  And advise  recheck next week at follow up with Dr. Derrel Nip. Patient to call if wound bleeding more.       Relevant Orders   Ambulatory referral to Orthopedic Surgery   Ambulatory referral to General Surgery   Dog bite - Primary   Relevant Medications   amoxicillin-clavulanate (AUGMENTIN) 875-125 MG tablet   oxyCODONE (ROXICODONE) 5 MG immediate release tablet   Other Relevant Orders   DG Forearm Right   CBC with Differential/Platelet (Completed)   Comprehensive metabolic panel (Completed)   Protime-INR (Completed)   Ambulatory referral to Orthopedic Surgery   Ambulatory referral to General Surgery    Outpatient Encounter Medications as of 12/06/2021  Medication Sig   amoxicillin-clavulanate (AUGMENTIN) 875-125 MG tablet Take 1 tablet by mouth 2 (two) times daily.   clonazePAM (KLONOPIN) 0.5 MG tablet Take 1 tablet (0.5 mg total) by mouth daily as needed for anxiety.   Fluticasone-Salmeterol (ADVAIR) 250-50 MCG/DOSE AEPB Inhale 1 puff into the lungs 2 (two) times daily as needed (shortness of braeth).   gabapentin (NEURONTIN) 300 MG capsule Take 900 mg by mouth at bedtime.   omeprazole (PRILOSEC) 40 MG capsule TAKE 1 CAPSULE BY MOUTH TWICE DAILY   rosuvastatin (CRESTOR) 10 MG tablet TAKE 1 TABLET BY MOUTH AT BEDTIME   sertraline (ZOLOFT) 100 MG tablet TAKE 1 TABLET BY MOUTH ONCE DAILY (Patient taking differently: Take 100 mg by mouth at bedtime.)   Syringe/Needle, Disp, (SYRINGE 3CC/25GX1") 25G X 1" 3 ML MISC Use  for b12 injections   tiotropium (SPIRIVA HANDIHALER) 18 MCG inhalation capsule Place 1 capsule (18 mcg total) into inhaler and inhale daily as needed (shortness of breath).   traZODone (DESYREL) 50 MG tablet TAKE 2 TABLETS BY MOUTH AT BEDTIME   [DISCONTINUED] cyanocobalamin (,VITAMIN B-12,) 1000 MCG/ML injection Inject 1 mL (1,000 mcg total) into the muscle every 30 (thirty) days.   [DISCONTINUED] HYDROcodone-acetaminophen (NORCO/VICODIN) 5-325 MG tablet Take 1 tablet by mouth every 6 (six) hours as needed for moderate pain.   [DISCONTINUED] nortriptyline (PAMELOR) 25 MG capsule TAKE 1 CAPSULE BY MOUTH AT BEDTIME   oxyCODONE (ROXICODONE) 5 MG immediate release tablet Take 1 tablet (5 mg total) by mouth every 6 (six) hours as needed for up to 7 days for severe pain.   No facility-administered encounter medications on file as of 12/06/2021.   Meds ordered this encounter  Medications   amoxicillin-clavulanate (AUGMENTIN) 875-125 MG tablet    Sig: Take 1 tablet by mouth 2 (two) times daily.    Dispense:  10 tablet    Refill:  0   oxyCODONE (ROXICODONE) 5 MG immediate release tablet    Sig: Take 1 tablet (5 mg total) by mouth every 6 (six) hours as needed for up to 7 days for severe pain.    Dispense:  28 tablet  Refill:  0    She agrees not to take any hydrocodone- discussed respiratory depression. She requested oxycodone today at office visit reported the hydrocodone not working for arm pain. Medications Discontinued During This Encounter  Medication Reason   HYDROcodone-acetaminophen (NORCO/VICODIN) 5-325 MG tablet Completed Course   oxyCODONE (ROXICODONE) 5 MG immediate release tablet Reorder    Red Flags discussed. The patient was given clear instructions to go to ER or return to medical center if any red flags develop, symptoms do not improve, worsen or new problems develop. They verbalized understanding.   Follow-up: Return in about 5 days (around 12/11/2021), or if symptoms worsen or  fail to improve, for at any time for any worsening symptoms, Go to Emergency room/ urgent care if worse.   Marcille Buffy, FNP

## 2021-12-06 NOTE — Telephone Encounter (Signed)
Please advise as patient was advised by [place] both today in office to not use the hydrocodone as well as oxycodone together?

## 2021-12-07 NOTE — Progress Notes (Signed)
Right forearm x ray is within normal limits.  She also has result note for INR.

## 2021-12-10 ENCOUNTER — Other Ambulatory Visit: Payer: Self-pay

## 2021-12-10 ENCOUNTER — Ambulatory Visit (INDEPENDENT_AMBULATORY_CARE_PROVIDER_SITE_OTHER): Payer: Medicare Other | Admitting: Adult Health

## 2021-12-10 ENCOUNTER — Encounter: Payer: Self-pay | Admitting: Adult Health

## 2021-12-10 VITALS — BP 108/54 | HR 95 | Temp 98.0°F | Resp 16 | Ht 62.0 in | Wt 95.1 lb

## 2021-12-10 DIAGNOSIS — W540XXD Bitten by dog, subsequent encounter: Secondary | ICD-10-CM

## 2021-12-10 DIAGNOSIS — S51811D Laceration without foreign body of right forearm, subsequent encounter: Secondary | ICD-10-CM

## 2021-12-10 DIAGNOSIS — T148XXA Other injury of unspecified body region, initial encounter: Secondary | ICD-10-CM

## 2021-12-10 DIAGNOSIS — Z7901 Long term (current) use of anticoagulants: Secondary | ICD-10-CM

## 2021-12-10 DIAGNOSIS — Z9889 Other specified postprocedural states: Secondary | ICD-10-CM

## 2021-12-10 LAB — CBC WITH DIFFERENTIAL/PLATELET
Basophils Absolute: 0.1 10*3/uL (ref 0.0–0.1)
Basophils Relative: 0.8 % (ref 0.0–3.0)
Eosinophils Absolute: 0.1 10*3/uL (ref 0.0–0.7)
Eosinophils Relative: 1.9 % (ref 0.0–5.0)
HCT: 40.1 % (ref 36.0–46.0)
Hemoglobin: 13.2 g/dL (ref 12.0–15.0)
Lymphocytes Relative: 50.3 % — ABNORMAL HIGH (ref 12.0–46.0)
Lymphs Abs: 3.8 10*3/uL (ref 0.7–4.0)
MCHC: 33 g/dL (ref 30.0–36.0)
MCV: 100.3 fl — ABNORMAL HIGH (ref 78.0–100.0)
Monocytes Absolute: 0.9 10*3/uL (ref 0.1–1.0)
Monocytes Relative: 11.9 % (ref 3.0–12.0)
Neutro Abs: 2.6 10*3/uL (ref 1.4–7.7)
Neutrophils Relative %: 35.1 % — ABNORMAL LOW (ref 43.0–77.0)
Platelets: 208 10*3/uL (ref 150.0–400.0)
RBC: 4 Mil/uL (ref 3.87–5.11)
RDW: 14.4 % (ref 11.5–15.5)
WBC: 7.5 10*3/uL (ref 4.0–10.5)

## 2021-12-10 LAB — PROTIME-INR
INR: 1.7 ratio — ABNORMAL HIGH (ref 0.8–1.0)
Prothrombin Time: 18.4 s — ABNORMAL HIGH (ref 9.6–13.1)

## 2021-12-10 MED ORDER — WARFARIN SODIUM 5 MG PO TABS: 5.0000 mg | ORAL_TABLET | Freq: Every day | ORAL | 1 refills | Status: DC

## 2021-12-10 MED ORDER — WARFARIN SODIUM 1 MG PO TABS: ORAL_TABLET | ORAL | 1 refills | Status: DC

## 2021-12-10 NOTE — Progress Notes (Signed)
Acute Office Visit  Subjective:    Patient ID: Tamara [name], female    DOB: 1946/07/13, 76 y.o.   MRN: [id number]  Chief Complaint  Patient presents with   Follow-up    Animal bite    HPI Patient is in today for  follow up from [place] seen for dog bite ) her own dogs shih tzu were in her lap and she had to go to ER 11/28/21 she had multiple skin flaps, and lacerations, requiring sutures,  Rabies vaccines not initiated due to her dogs were vaccinated fully and rabies was current. .  She was placed on Augmentin 7 days at the ER.   TDAP was given.   She was taking 7 mg of Warfarin daily that was restarted on 11/23/21 after 15 days past her neurosurgery lumbar spine with Elise Benne. History of DVT 2-3 .  INR was checked  last visit  12/06/21 and was 3.1 - one day was held of warfarin and resumed 23m daily with plan for close monitoring. She was given 5 additional days of Augmentin at office viist given wound was still oozing and open in areas.  She reports she needs refill on her warfarin, she has 5 mg tablet and 1 mg tablet( she is taking 2 of these) for a total of 7 mg daily.   She is going to see surgical for consult. Given the amount of skin tears/ flaps she had repaired in case wound healing is delayed.  X ray in office of right forearm was negative- orthopedics not needed at this time.   Pain still there. Oxycodone 5 mg is helping with pain. Feels better today. Feels is healing well. Denies any purluent drainage.    Patient  denies any fever, body aches,chills, rash, chest pain, shortness of breath, nausea, vomiting, or diarrhea.  Denies dizziness, lightheadedness, pre syncopal or syncopal episodes.    Past Medical History:  Diagnosis Date   Aortic atherosclerosis ([facility]    Arthritis    B12 deficiency    Bowel obstruction ([facility] [place]    s/p laparotomy/ LOA 3/08, prior reversal of jejunal bypasss 2004   Carpal tunnel syndrome    Chronic abdominal pain    Clostridium  difficile infection 05/24/2021   COPD (chronic obstructive pulmonary disease) ([facility]    no inhalers   Crohn's disease ([facility]    remission 2016   Depression    DVT (deep venous thrombosis) ([facility] 04/24/2018   DVT (deep venous thrombosis) ([facility] 05/2021   left leg   Dysphagia 12/04/2017   Facial twitching 07/11/2018   GERD (gastroesophageal reflux disease)    Headache    History of DVT (deep vein thrombosis) 01/2007   post operative right leg, s/p vena cava filter   History of hiatal hernia    History of kidney stones    Intractable chronic migraine without aura and without status migrainosus 05/20/2019   Leukocytosis    Leukocytosis    Osteoporosis    Pneumonia    Sleep apnea    Small bowel obstruction ([facility]    2001, 2004    Past Surgical History:  Procedure Laterality Date   ABDOMINAL HYSTERECTOMY     ANAL SPHINCTER PROSTHESIS PLACEMENT     APPENDECTOMY     CARPAL TUNNEL RELEASE Right 04/22/2014   CATARACT EXTRACTION     CERVICAL FUSION  2005   C6-7, anterior approach   CHOLECYSTECTOMY     COLONOSCOPY  2015   COLONOSCOPY     2005,  2007, 2010, 2015, 2020, 2021   COLONOSCOPY WITH PROPOFOL N/A 05/10/2019   Procedure: COLONOSCOPY WITH PROPOFOL;  Surgeon: Manya Silvas, MD;  Location[facility] ENDOSCOPY;  Service: Endoscopy;  Laterality: N/A;   ESOPHAGEAL MANOMETRY N/A 02/11/2018   Procedure: ESOPHAGEAL MANOMETRY (EM);  Surgeon: Lucilla Lame, MD;  Location: [facility] ENDOSCOPY;  Service: Endoscopy;  Laterality: N/A;   ESOPHAGOGASTRODUODENOSCOPY     1996, 2006, 2008, 2010, 2014, 2018, 2021   ESOPHAGOGASTRODUODENOSCOPY (EGD) WITH PROPOFOL N/A 12/17/2017   Procedure: ESOPHAGOGASTRODUODENOSCOPY (EGD) WITH PROPOFOL;  Surgeon: Robert Bellow, MD;  Location[facility] ENDOSCOPY;  Service: Endoscopy;  Laterality: N/A;   ESOPHAGOGASTRODUODENOSCOPY (EGD) WITH PROPOFOL N/A 01/08/2018   Procedure: ESOPHAGOGASTRODUODENOSCOPY (EGD) WITH PROPOFOL;  Surgeon: Robert Bellow, MD;  Location: [facility]  ENDOSCOPY;  Service: Endoscopy;  Laterality: N/A;   EXCISION NEUROMA Left 09/18/2021   Procedure: Excision of traumatic neuroma infrapatellar branch saphenous nerve left knee.;  Surgeon: Corky Mull, MD;  Location: [facility] ORS;  Service: Orthopedics;  Laterality: Left;   gyn surgery     hysterectomy   INSERTION OF VENA CAVA FILTER Right    KNEE ARTHROSCOPY WITH MEDIAL MENISECTOMY Left 06/22/2020   Procedure: Left knee arthroscopic partial medial meniscectomy;  Surgeon: Leim Fabry, MD;  Location: [facility];  Service: Orthopedics;  Laterality: Left;   KNEE ARTHROSCOPY WITH MEDIAL MENISECTOMY Left 05/25/2021   Procedure: Left knee arthroscopy, infrapatellar fat pad excision;  Surgeon: Leim Fabry, MD;  Location: [facility] ORS;  Service: Orthopedics;  Laterality: Left;   LAPAROTOMY  01/30/2007   for bowel obstruction with LOA   LUMBAR LAMINECTOMY/DECOMPRESSION MICRODISCECTOMY N/A 12/25/2016   Procedure: LUMBAR DECOMPRESSION L4-5;  Surgeon: Meade Maw, MD;  Location: [facility] ORS;  Service: Neurosurgery;  Laterality: N/A;   NISSEN FUNDOPLICATION     ROTATOR CUFF REPAIR Bilateral    SPLENECTOMY     secondary to colonoscopy   TONSILLECTOMY AND ADENOIDECTOMY     TRIGGER FINGER RELEASE Left 08/25/2015   TRIGGER FINGER RELEASE Right 09/18/2015   VAGINAL HYSTERECTOMY      Family History  Problem Relation Age of Onset   Diabetes Mother        type 2   Heart disease Mother    Hypertension Mother    Coronary artery disease Father    Heart attack Sister     Social History   Socioeconomic History   Marital status: Widowed    Spouse name: Not on file   Number of children: 3   Years of education: Not on file   Highest education level: Not on file  Occupational History   Not on file  Tobacco Use   Smoking status: Every Day    Packs/day: 1.00    Years: 50.00    Pack years: 50.00    Types: Cigarettes   Smokeless tobacco: Never  Vaping Use   Vaping Use: Never used  Substance  and Sexual Activity   Alcohol use: No    Alcohol/week: 0.0 standard drinks   Drug use: No   Sexual activity: Not on file  Other Topics Concern   Not on file  Social History Narrative   Three sons   Social Determinants of Health   Financial Resource Strain: Not on file  Food Insecurity: Not on file  Transportation Needs: Not on file  Physical Activity: Not on file  Stress: Not on file  Social Connections: Not on file  Intimate Partner Violence: Not on file    Outpatient Medications Prior to Visit  Medication  Sig Dispense Refill   amoxicillin-clavulanate (AUGMENTIN) 875-125 MG tablet Take 1 tablet by mouth 2 (two) times daily. 10 tablet 0   clonazePAM (KLONOPIN) 0.5 MG tablet Take 1 tablet (0.5 mg total) by mouth daily as needed for anxiety. 30 tablet 0   cyanocobalamin (,VITAMIN B-12,) 1000 MCG/ML injection Inject 1 mL (1,000 mcg total) into the muscle every 30 (thirty) days. 10 mL 2   Fluticasone-Salmeterol (ADVAIR) 250-50 MCG/DOSE AEPB Inhale 1 puff into the lungs 2 (two) times daily as needed (shortness of braeth).     gabapentin (NEURONTIN) 300 MG capsule Take 900 mg by mouth at bedtime.     nortriptyline (PAMELOR) 25 MG capsule Take 1 capsule (25 mg total) by mouth at bedtime. 90 capsule 1   omeprazole (PRILOSEC) 40 MG capsule TAKE 1 CAPSULE BY MOUTH TWICE DAILY 180 capsule 1   oxyCODONE (ROXICODONE) 5 MG immediate release tablet Take 1 tablet (5 mg total) by mouth every 6 (six) hours as needed for up to 7 days for severe pain. 28 tablet 0   rosuvastatin (CRESTOR) 10 MG tablet TAKE 1 TABLET BY MOUTH AT BEDTIME 90 tablet 1   sertraline (ZOLOFT) 100 MG tablet TAKE 1 TABLET BY MOUTH ONCE DAILY (Patient taking differently: Take 100 mg by mouth at bedtime.) 90 tablet 1   Syringe/Needle, Disp, (SYRINGE 3CC/25GX1") 25G X 1" 3 ML MISC Use for b12 injections 50 each 0   tiotropium (SPIRIVA HANDIHALER) 18 MCG inhalation capsule Place 1 capsule (18 mcg total) into inhaler and inhale daily  as needed (shortness of breath).     traZODone (DESYREL) 50 MG tablet TAKE 2 TABLETS BY MOUTH AT BEDTIME 180 tablet 1   No facility-administered medications prior to visit.    Allergies  Allergen Reactions   Apixaban Nausea Only    Dizziness   Sulfa Antibiotics Rash    Review of Systems  Constitutional: Negative.   HENT: Negative.    Cardiovascular: Negative.   Genitourinary: Negative.   Musculoskeletal:  Positive for arthralgias and back pain.       Chronic followed by neurosurgeon.  Right forearm x ray is within normal limits.  She also has result note for INR.      Objective:    Physical Exam Vitals reviewed.  Constitutional:      Appearance: Normal appearance.     Comments: Thin body habitus   HENT:     Head: Normocephalic.  Cardiovascular:     Rate and Rhythm: Normal rate and regular rhythm.     Pulses: Normal pulses.     Heart sounds: Normal heart sounds.  Pulmonary:     Effort: Pulmonary effort is normal. No respiratory distress.     Breath sounds: Normal breath sounds. No stridor. No wheezing, rhonchi or rales.  Chest:     Chest wall: No tenderness.  Abdominal:     Palpations: Abdomen is soft.  Musculoskeletal:     Cervical back: Normal range of motion. No tenderness.  Lymphadenopathy:     Cervical: No cervical adenopathy.  Skin:    Findings: Erythema, signs of injury, laceration and wound present.          Comments: See media below: right forearm from today's visit follow up from Emergency room repair of lacerations and multiple skin tears / dog bite.  Mild bloody drainage on gauze and at wound.  2+ radial pulse.  Bruising notes.  Sutures intact.  Skin is very frail and thin.  No warmth or signs of spreading  infection.  Painful.   Neurological:     Mental Status: She is oriented to person, place, and time.     Media Information Document Information  Photos  Right forearm   12/10/2021 08:46  Attached To:  Office Visit on 12/10/21 with  Vetra Shinall, Kelby Aline, FNP   Source Information  Doreen Beam, FNP   [facility]    Media Information Document Information  Photos  Right forearm 1/30   12/10/2021 08:47  Attached To:  Office Visit on 12/10/21 with Doreen Beam, FNP   Source Information  Mohab Ashby, Kelby Aline, FNP   [facility]   BP (!) 108/54    Pulse 95    Temp 98 F (36.7 C) (Oral)    Resp 16    Ht 5' 2"  (1.575 m)    Wt 95 lb 2 oz (43.1 kg)    SpO2 97%    BMI 17.40 kg/m  Wt Readings from Last 3 Encounters:  12/10/21 95 lb 2 oz (43.1 kg)  12/06/21 92 lb 3.2 oz (41.8 kg)  11/08/21 95 lb (43.1 kg)    Health Maintenance Due  Topic Date Due   Zoster Vaccines- Shingrix (1 of 2) Never done   COVID-19 Vaccine (4 - Booster for Pfizer series) 10/06/2020    There are no preventive care reminders to display for this patient.   Lab Results  Component Value Date   TSH 1.74 06/11/2017   Lab Results  Component Value Date   WBC 8.5 12/06/2021   HGB 13.7 12/06/2021   HCT 41.8 12/06/2021   MCV 100.6 (H) 12/06/2021   PLT 234.0 12/06/2021   Lab Results  Component Value Date   NA 137 12/06/2021   K 4.3 12/06/2021   CO2 28 12/06/2021   GLUCOSE 96 12/06/2021   BUN 7 12/06/2021   CREATININE 0.68 12/06/2021   BILITOT 0.4 12/06/2021   ALKPHOS 48 12/06/2021   AST 32 12/06/2021   ALT 26 12/06/2021   PROT 6.3 12/06/2021   ALBUMIN 4.4 12/06/2021   CALCIUM 9.3 12/06/2021   ANIONGAP 7 11/02/2021   GFR 85.99 12/06/2021   Lab Results  Component Value Date   CHOL 188 01/26/2020   Lab Results  Component Value Date   HDL 58.90 01/26/2020   Lab Results  Component Value Date   LDLCALC 92 01/26/2020   Lab Results  Component Value Date   TRIG 182.0 (H) 01/26/2020   Lab Results  Component Value Date   CHOLHDL 3 01/26/2020   Lab Results  Component Value Date   HGBA1C 6.1 09/21/2020       Assessment & Plan:   Problem List Items Addressed This Visit       Musculoskeletal and  Integument   Multiple skin tears     Other   History of repair of laceration   Anticoagulated on Coumadin    3.1 on 12/06/21 INR, held one dose of coumadin since on 5 additional days of augmentin, she has restarted her current dose of Coumadin 36m tablet once with coumadin 185mtablet ( taking 2 tablets) for a total of 7 mg daily.  Goal is 2- 3 for DVT history of leg. Recheck INR on 12/10/21 last day of augmentin        Relevant Medications   warfarin (COUMADIN) 1 MG tablet   warfarin (COUMADIN) 5 MG tablet   Other Relevant Orders   Protime-INR   Dog bite - Primary    12/10/21 appears to be healing well, monitor  anticoagulation well. X ray done at last visit of right forearm is within normal. Due to the severity of her wound and her thin body habitus did order surgery consult/ plastics at her first visit post hospital.  She should not need orthopedics at this point as forearm x ray within normal and she is healing well.  Signs of infection and when to return, she sees Dr. Derrel Nip PCO on 12/14/21 and will have recheck at that time as well., call sooner if needed.        Relevant Orders   Protime-INR   CBC with Differential/Platelet   Keep follow up with surgeon. Sutures due to come out 12/11/21 will let surgeon remove and evaluate.  Continue to use non stick gauze with padding and use ace wrap to cover, non stick getting stuck to wound easily, removal and cleaning with sterile saline,  Does appear to be healing well.  Reviewed with patient wound care and instructions, she will not change dressing will see plastic Tuesday and Dr. Derrel Nip Friday call before if any pain or worsening symptoms.   Wound care provided by Kerin Salen RN as above.   Refill given on coumadin, she held one day last week and then restarted 7 mg once daily. She will see Dr. Derrel Nip on 12/14/21 for scheduled viist and follow up. We will recheck today out of caution she finished last dose of augmentin today.   Meds ordered  this encounter  Medications   warfarin (COUMADIN) 1 MG tablet    Sig: TAKE 2 TABLET BY MOUTH ONCE DAILY WITH THE 5MG FOR 7MG TOTAL DAILY    Dispense:  90 tablet    Refill:  1   warfarin (COUMADIN) 5 MG tablet    Sig: Take 1 tablet (5 mg total) by mouth at bedtime. Take with 2 mg to equal 7 mg every night    Dispense:  90 tablet    Refill:  1   Return in about 4 days (around 12/14/2021), or if symptoms worsen or fail to improve, for at any time for any worsening symptoms, Go to Emergency room/ urgent care if worse.   Red Flags discussed. The patient was given clear instructions to go to ER or return to medical center if any red flags develop, symptoms do not improve, worsen or new problems develop. They verbalized understanding.    Marcille Buffy, FNP

## 2021-12-10 NOTE — Assessment & Plan Note (Signed)
12/10/21 appears to be healing well, monitor anticoagulation well. X ray done at last visit of right forearm is within normal. Due to the severity of her wound and her thin body habitus did order surgery consult/ plastics at her first visit post hospital.  She should not need orthopedics at this point as forearm x ray within normal and she is healing well.  Signs of infection and when to return, she sees Dr. Derrel Nip PCO on 12/14/21 and will have recheck at that time as well., call sooner if needed.

## 2021-12-10 NOTE — Patient Instructions (Signed)
Wound Care, Adult ?Taking care of your wound properly can help to prevent pain, infection, and scarring. It can also help your wound heal more quickly. Follow instructions from your health care provider about how to care for your wound. ?Supplies needed: ?Soap and water. ?Wound cleanser, saline, or germ-free (sterile) water. ?Gauze. ?If needed, a clean bandage (dressing) or other type of wound dressing material to cover or place in the wound. Follow your health care provider's instructions about what dressing supplies to use. ?Cream or topical ointment to apply to the wound, if told by your health care provider. ?How to care for your wound ?Cleaning the wound ?Ask your health care provider how to clean the wound. This may include: ?Using mild soap and water, a wound cleanser, saline, or sterile water. ?Using a clean gauze to pat the wound dry after cleaning it. Do not rub or scrub the wound. ?Dressing care ?Wash your hands with soap and water for at least 20 seconds before and after you change the dressing. If soap and water are not available, use hand sanitizer. ?Change your dressing as told by your health care provider. This may include: ?Cleaning or rinsing out (irrigating) the wound. ?Application of cream or topical ointment, if told by your health care provider. ?Placing a dressing over the wound or in the wound (packing). ?Covering the wound with an outer dressing. ?Leave stitches (sutures), staples, skin glue, or adhesive strips in place. These skin closures may need to stay in place for 2 weeks or longer. If adhesive strip edges start to loosen and curl up, you may trim the loose edges. Do not remove adhesive strips completely unless your health care provider tells you to do that. ?Ask your health care provider when you can leave the wound uncovered. ?Checking for infection ?Check your wound area every day for signs of infection. Check for: ?More redness, swelling, or pain. ?Fluid or blood. ?Warmth. ?Pus or  a bad smell. ? ?Follow these instructions at home ?Medicines ?If you were prescribed an antibiotic medicine, cream, or ointment, take or apply it as told by your health care provider. Do not stop using the antibiotic even if your condition improves. ?If you were prescribed pain medicine, take it 30 minutes before you do any wound care or as told by your health care provider. ?Take over-the-counter and prescription medicines only as told by your health care provider. ?Eating and drinking ?Eat a diet that includes protein, vitamin A, vitamin C, and other nutrient-rich foods to help the wound heal. ?Foods rich in protein include meat, fish, eggs, dairy, beans, and nuts. ?Foods rich in vitamin A include carrots and dark green, leafy vegetables. ?Foods rich in vitamin C include citrus fruits, tomatoes, broccoli, and peppers. ?Drink enough fluid to keep your urine pale yellow. ?General instructions ?Do not take baths, swim, or use a hot tub until your health care provider approves. Ask your health care provider if you may take showers. You may only be allowed to take sponge baths. ?Do not scratch or pick at the wound. Keep it covered as told by your health care provider. ?Return to your normal activities as told by your health care provider. Ask your health care provider what activities are safe for you. ?Protect your wound from the sun when you are outside for the first 6 months, or for as long as told by your health care provider. Cover up the scar area or apply sunscreen that has an SPF of at least 30. ?Do not   use any products that contain nicotine or tobacco. These products include cigarettes, chewing tobacco, and vaping devices, such as e-cigarettes. If you need help quitting, ask your health care provider. ?Keep all follow-up visits. This is important. ?Contact a health care provider if: ?You received a tetanus shot and you have swelling, severe pain, redness, or bleeding at the injection site. ?Your pain is not  controlled with medicine. ?You have any of these signs of infection: ?More redness, swelling, or pain around the wound. ?Fluid or blood coming from the wound. ?Warmth coming from the wound. ?A fever or chills. ?You are nauseous or you vomit. ?You are dizzy. ?You have a new rash or hardness around the wound. ?Get help right away if: ?You have a red streak of skin near the area around your wound. ?Pus or a bad smell coming from the wound. ?Your wound has been closed with staples, sutures, skin glue, or adhesive strips and it begins to open up and separate. ?Your wound is bleeding, and the bleeding does not stop with gentle pressure. ?These symptoms may represent a serious problem that is an emergency. Do not wait to see if the symptoms will go away. Get medical help right away. Call your local emergency services (911 in the [place] Do not drive yourself to the hospital. ?Summary ?Always wash your hands with soap and water for at least 20 seconds before and after changing your dressing. ?Change your dressing as told by your health care provider. ?To help with healing, eat foods that are rich in protein, vitamin A, vitamin C, and other nutrients. ?Check your wound every day for signs of infection. Contact your health care provider if you think that your wound is infected. ?This information is not intended to replace advice given to you by your health care provider. Make sure you discuss any questions you have with your health care provider. ?Document Revised: 03/06/2021 Document Reviewed: 03/06/2021 ?Elsevier Patient Education ? 2022 [employer] ? ?

## 2021-12-10 NOTE — Assessment & Plan Note (Addendum)
3.1 on 12/06/21 INR, held one dose of coumadin since on 5 additional days of augmentin, she has restarted her current dose of Coumadin 9m tablet once with coumadin 172mtablet ( taking 2 tablets) for a total of 7 mg daily.  Goal is 2- 3 for DVT history of leg. Recheck INR on 12/10/21 last day of augmentin

## 2021-12-10 NOTE — Progress Notes (Signed)
INR slightly subtherapeutic, given she has arm wound, lets continue 7 mg once  daily of her warfarin . Therapy goal is 2-3. One dose was held previously when 3.1. she is seeing Dr. Derrel Nip on this Friday and can recheck PT/INR at that time. She has now completed Augmentin. CBC stable  Of note she has warfarin 5 mg tablet taking one tablet with warfarin 1 mg tablet ( takes 2 tablets) for a total of 7 mg once daily.

## 2021-12-11 ENCOUNTER — Telehealth: Payer: Self-pay

## 2021-12-11 ENCOUNTER — Encounter: Payer: Self-pay | Admitting: Surgery

## 2021-12-11 ENCOUNTER — Ambulatory Visit: Payer: Medicare Other | Admitting: Surgery

## 2021-12-11 ENCOUNTER — Other Ambulatory Visit: Payer: Self-pay | Admitting: Adult Health

## 2021-12-11 ENCOUNTER — Other Ambulatory Visit: Payer: Self-pay

## 2021-12-11 VITALS — BP 127/76 | HR 97 | Temp 98.2°F | Ht 63.0 in | Wt 93.4 lb

## 2021-12-11 DIAGNOSIS — W540XXA Bitten by dog, initial encounter: Secondary | ICD-10-CM

## 2021-12-11 DIAGNOSIS — S51851A Open bite of right forearm, initial encounter: Secondary | ICD-10-CM | POA: Diagnosis not present

## 2021-12-11 DIAGNOSIS — W540XXD Bitten by dog, subsequent encounter: Secondary | ICD-10-CM

## 2021-12-11 MED ORDER — SILVER SULFADIAZINE 1 % EX CREA: TOPICAL_CREAM | CUTANEOUS | 0 refills | Status: DC

## 2021-12-11 NOTE — Progress Notes (Signed)
Patient ID: Tamara [name], female   DOB: [birth year], 76 y.o.   MRN: [id number]  Chief Complaint: Follow-up dog bite right forearm  History of Present Illness Tamara [name] is a 76 y.o. female with a dog bite from approximately January 18.  Seen in ED underwent x-ray evaluation.  Subsequent washout and repair of skin tears with multiple sutures and Steri-Strips.  Patient was started on antibiotics for a 2-week course which she finished yesterday.  She denies fevers or chills. Follow-up since that time included dressing changes every 3 days by her primary care physician, so she has been attending to her wound at home.  Referred here for suture removal and evaluation and further wound care. She is on Coumadin with a history of DVT.  She continues to smoke. Of note the dogs were her own, she was trying to separate a female dog who seem to be harassing a female dog who is sick.  Past Medical History Past Medical History:  Diagnosis Date   Aortic atherosclerosis ([place])    Arthritis    B12 deficiency    Bowel obstruction ([place])    s/p laparotomy/ LOA 3/08, prior reversal of jejunal bypasss 2004   Carpal tunnel syndrome    Chronic abdominal pain    Clostridium difficile infection 05/24/2021   COPD (chronic obstructive pulmonary disease) ([place])    no inhalers   Crohn's disease ([place])    remission 2016   Depression    DVT (deep venous thrombosis) ([place]) 04/24/2018   DVT (deep venous thrombosis) ([place]) 05/2021   left leg   Dysphagia 12/04/2017   Facial twitching 07/11/2018   GERD (gastroesophageal reflux disease)    Headache    History of DVT (deep vein thrombosis) 01/2007   post operative right leg, s/p vena cava filter   History of hiatal hernia    History of kidney stones    Intractable chronic migraine without aura and without status migrainosus 05/20/2019   Leukocytosis    Leukocytosis    Osteoporosis    Pneumonia    Sleep apnea    Small bowel obstruction ([place])    2001, 2004       Past Surgical History:  Procedure Laterality Date   ABDOMINAL HYSTERECTOMY     ANAL SPHINCTER PROSTHESIS PLACEMENT     APPENDECTOMY     CARPAL TUNNEL RELEASE Right 04/22/2014   CATARACT EXTRACTION     CERVICAL FUSION  2005   C6-7, anterior approach   CHOLECYSTECTOMY     COLONOSCOPY  2015   COLONOSCOPY     2005, 2007, 2010, 2015, 2020, 2021   COLONOSCOPY WITH PROPOFOL N/A 05/10/2019   Procedure: COLONOSCOPY WITH PROPOFOL;  Surgeon: Manya Silvas, MD;  Location: [facility] ENDOSCOPY;  Service: Endoscopy;  Laterality: N/A;   ESOPHAGEAL MANOMETRY N/A 02/11/2018   Procedure: ESOPHAGEAL MANOMETRY (EM);  Surgeon: Lucilla Lame, MD;  Location: [facility] ENDOSCOPY;  Service: Endoscopy;  Laterality: N/A;   ESOPHAGOGASTRODUODENOSCOPY     1996, 2006, 2008, 2010, 2014, 2018, 2021   ESOPHAGOGASTRODUODENOSCOPY (EGD) WITH PROPOFOL N/A 12/17/2017   Procedure: ESOPHAGOGASTRODUODENOSCOPY (EGD) WITH PROPOFOL;  Surgeon: Robert Bellow, MD;  Location[facility] ENDOSCOPY;  Service: Endoscopy;  Laterality: N/A;   ESOPHAGOGASTRODUODENOSCOPY (EGD) WITH PROPOFOL N/A 01/08/2018   Procedure: ESOPHAGOGASTRODUODENOSCOPY (EGD) WITH PROPOFOL;  Surgeon: Robert Bellow, MD;  Location: [facility] ENDOSCOPY;  Service: Endoscopy;  Laterality: N/A;   EXCISION NEUROMA Left 09/18/2021   Procedure: Excision of traumatic neuroma infrapatellar branch saphenous nerve left knee.;  Surgeon: Roland Rack,  Marshall Cork, MD;  Location: [facility] ORS;  Service: Orthopedics;  Laterality: Left;   gyn surgery     hysterectomy   INSERTION OF VENA CAVA FILTER Right    KNEE ARTHROSCOPY WITH MEDIAL MENISECTOMY Left 06/22/2020   Procedure: Left knee arthroscopic partial medial meniscectomy;  Surgeon: Leim Fabry, MD;  Location: [place];  Service: Orthopedics;  Laterality: Left;   KNEE ARTHROSCOPY WITH MEDIAL MENISECTOMY Left 05/25/2021   Procedure: Left knee arthroscopy, infrapatellar fat pad excision;  Surgeon: Leim Fabry, MD;  Location: [facility] ORS;   Service: Orthopedics;  Laterality: Left;   LAPAROTOMY  01/30/2007   for bowel obstruction with LOA   LUMBAR LAMINECTOMY/DECOMPRESSION MICRODISCECTOMY N/A 12/25/2016   Procedure: LUMBAR DECOMPRESSION L4-5;  Surgeon: Meade Maw, MD;  Location: [facility] ORS;  Service: Neurosurgery;  Laterality: N/A;   NISSEN FUNDOPLICATION     ROTATOR CUFF REPAIR Bilateral    SPLENECTOMY     secondary to colonoscopy   TONSILLECTOMY AND ADENOIDECTOMY     TRIGGER FINGER RELEASE Left 08/25/2015   TRIGGER FINGER RELEASE Right 09/18/2015   VAGINAL HYSTERECTOMY      Allergies  Allergen Reactions   Apixaban Nausea Only    Dizziness   Sulfa Antibiotics Rash    Current Outpatient Medications  Medication Sig Dispense Refill   clonazePAM (KLONOPIN) 0.5 MG tablet Take 1 tablet (0.5 mg total) by mouth daily as needed for anxiety. 30 tablet 0   cyanocobalamin (,VITAMIN B-12,) 1000 MCG/ML injection Inject 1 mL (1,000 mcg total) into the muscle every 30 (thirty) days. 10 mL 2   Fluticasone-Salmeterol (ADVAIR) 250-50 MCG/DOSE AEPB Inhale 1 puff into the lungs 2 (two) times daily as needed (shortness of braeth).     gabapentin (NEURONTIN) 300 MG capsule Take 900 mg by mouth at bedtime.     nortriptyline (PAMELOR) 25 MG capsule Take 1 capsule (25 mg total) by mouth at bedtime. 90 capsule 1   omeprazole (PRILOSEC) 40 MG capsule TAKE 1 CAPSULE BY MOUTH TWICE DAILY 180 capsule 1   oxyCODONE (ROXICODONE) 5 MG immediate release tablet Take 1 tablet (5 mg total) by mouth every 6 (six) hours as needed for up to 7 days for severe pain. 28 tablet 0   rosuvastatin (CRESTOR) 10 MG tablet TAKE 1 TABLET BY MOUTH AT BEDTIME 90 tablet 1   sertraline (ZOLOFT) 100 MG tablet TAKE 1 TABLET BY MOUTH ONCE DAILY (Patient taking differently: Take 100 mg by mouth at bedtime.) 90 tablet 1   Syringe/Needle, Disp, (SYRINGE 3CC/25GX1") 25G X 1" 3 ML MISC Use for b12 injections 50 each 0   tiotropium (SPIRIVA HANDIHALER) 18 MCG inhalation capsule  Place 1 capsule (18 mcg total) into inhaler and inhale daily as needed (shortness of breath).     traZODone (DESYREL) 50 MG tablet TAKE 2 TABLETS BY MOUTH AT BEDTIME 180 tablet 1   warfarin (COUMADIN) 1 MG tablet TAKE 2 TABLET BY MOUTH ONCE DAILY WITH THE 5MG FOR 7MG TOTAL DAILY 90 tablet 1   warfarin (COUMADIN) 5 MG tablet Take 1 tablet (5 mg total) by mouth at bedtime. Take with 2 mg to equal 7 mg every night 90 tablet 1   No current facility-administered medications for this visit.    Family History Family History  Problem Relation Age of Onset   Diabetes Mother        type 2   Heart disease Mother    Hypertension Mother    Coronary artery disease Father    Heart attack Sister  Social History Social History   Tobacco Use   Smoking status: Every Day    Packs/day: 1.00    Years: 50.00    Pack years: 50.00    Types: Cigarettes   Smokeless tobacco: Never  Vaping Use   Vaping Use: Never used  Substance Use Topics   Alcohol use: No    Alcohol/week: 0.0 standard drinks   Drug use: No        Review of Systems  All other systems reviewed and are negative.    Physical Exam Blood pressure 127/76, pulse 97, temperature 98.2 F (36.8 C), temperature source Oral, height 5' 3"  (1.6 m), weight 93 lb 6.4 oz (42.4 kg), SpO2 98 %. Last Weight  Most recent update: 12/11/2021 11:28 AM    Weight  42.4 kg (93 lb 6.4 oz)             CONSTITUTIONAL: Well developed, and nourished, appropriately responsive and aware without distress.   EYES: Sclera non-icteric.   EARS, NOSE, MOUTH AND THROAT: Mask worn.    Hearing is intact to voice.  NECK: Trachea is midline.  LYMPH NODES:  Lymph nodes in the neck are not enlarged. RESPIRATORY:  Lungs are clear, and breath sounds are equal bilaterally. Normal respiratory effort without pathologic use of accessory muscles. CARDIOVASCULAR: Heart is regular in rate and rhythm. GI: The abdomen is soft, nontender, and nondistended.   MUSCULOSKELETAL:  Symmetrical muscle tone appreciated in all four extremities.    SKIN: Skin turgor is normal. No pathologic skin lesions appreciated.  There is a complex skin tear of the right forearm, multiple sutures of nylon were removed.  There are only 2 small areas where it appears she may have skin loss, the questionable viability though does not warrant proceeding with any debridement at this time.  There are limited noncovered areas, that are clean and appear to be granulating.  There is no evidence of erythema or induration.  And nothing to debride at this time. She tolerated suture removal well.  However began requesting narcotic pain medication during dressing application. NEUROLOGIC:  Motor and sensation appear grossly normal.  Cranial nerves are grossly without defect. PSYCH:  Alert and oriented to person, place and time. Affect is appropriate for situation.  Data Reviewed I have personally reviewed what is currently available of the patient's imaging, recent labs and medical records.   Labs:  CBC Latest Ref Rng & Units 12/10/2021 12/06/2021 11/02/2021  WBC 4.0 - 10.5 K/uL 7.5 8.5 10.7(H)  Hemoglobin 12.0 - 15.0 g/dL 13.2 13.7 13.5  Hematocrit 36.0 - 46.0 % 40.1 41.8 40.1  Platelets 150.0 - 400.0 K/uL 208.0 234.0 270   CMP Latest Ref Rng & Units 12/06/2021 11/02/2021 09/12/2021  Glucose 70 - 99 mg/dL 96 142(H) 112(H)  BUN 6 - 23 mg/dL 7 9 12   Creatinine 0.40 - 1.20 mg/dL 0.68 0.60 0.71  Sodium 135 - 145 mEq/L 137 135 139  Potassium 3.5 - 5.1 mEq/L 4.3 3.6 4.2  Chloride 96 - 112 mEq/L 104 104 107  CO2 19 - 32 mEq/L 28 24 26   Calcium 8.4 - 10.5 mg/dL 9.3 9.3 9.3  Total Protein 6.0 - 8.3 g/dL 6.3 - -  Total Bilirubin 0.2 - 1.2 mg/dL 0.4 - -  Alkaline Phos 39 - 117 U/L 48 - -  AST 0 - 37 U/L 32 - -  ALT 0 - 35 U/L 26 - -      Imaging: Radiology review:  CLINICAL DATA:  Dog bite  to the right upper extremity.   EXAM: RIGHT FOREARM - 2 VIEW   COMPARISON:  None.    FINDINGS: There is no acute fracture or dislocation. The bones are osteopenic. The soft tissues are unremarkable. No effusion object soft tissue gas.   IMPRESSION: No acute findings.     Electronically Signed   By: Anner Crete M.D.   On: 12/07/2021 02:53 Within last 24 hrs: No results found.  Assessment    Skin tear secondary to dog bite trauma from right forearm.  No evidence of infection.  Limited areas of skin are of questionable viability, however remains intact without drainage at this time.  Limited areas where dermal loss, is clean and the tissues moist. Patient Active Problem List   Diagnosis Date Noted   Dog bite 12/06/2021   Lower respiratory infection (e.g., bronchitis, pneumonia, pneumonitis, pulmonitis) 11/08/2021   Anticoagulated on Coumadin 07/06/2021   Acute deep vein thrombosis (DVT) of left peroneal vein ([place]) 06/10/2021   Anemia 06/10/2021   Anxiety state 05/06/2021   Cerebrovascular disease 07/31/2020   Aortic atherosclerosis ([place]) 07/19/2020   Coronary atherosclerosis due to calcified coronary lesion 07/19/2020   Emphysema with chronic bronchitis ([place]) 01/28/2020   OSA (obstructive sleep apnea) 08/01/2019   GERD (gastroesophageal reflux disease) 04/28/2019   Insomnia due to anxiety and fear 07/11/2018   History of repair of laceration 04/28/2018   Malnutrition ([place]) 02/20/2018   Reflux esophagitis 01/24/2018   Macrocytosis 12/04/2017   Primary osteoarthritis of left knee 06/24/2017   Spinal stenosis, lumbar region, with neurogenic claudication 11/08/2016   Polycythemia, secondary 10/24/2016   Medicare annual wellness visit, subsequent 04/16/2016   Underweight 08/27/2015   Vitamin D deficiency 08/27/2015   Pain in both upper extremities 08/24/2015   Multiple skin tears 03/24/2015   Amaurosis fugax 12/13/2014   Uric acid arthropathy 10/08/2014   Carpal tunnel syndrome 03/08/2014   B12 deficiency 01/26/2014   History of tobacco abuse 01/26/2014    Encounter for preventive health examination 05/11/2013   Steroid-induced osteoporosis 11/19/2011   Chronic abdominal pain    H/O small bowel obstruction    Crohn's disease of intestine, with intestinal obstruction ([place])    History of DVT (deep vein thrombosis) 01/10/2007    Plan    After suture removal I have elected to utilize a limited Silvadene application, with gauze cover.  We will secure with roll gauze and Coban/Ace wrap.  I have advised that they remove the dressing daily, cautiously shower/utilized water rinse to wash away the residual Silvadene and exudate.  Son is present to assist with reapplication of Silvadene and dressings.  I understand she will be seen by her primary care on Friday.  I failed to take any photos of her wound today.  I anticipate seeing her a week from Thursday to broaden the interval of her visits.  I will not be prescribing any additional pain medications.  Face-to-face time spent with the patient and accompanying care providers(if present) was 35 minutes, with more than 50% of the time spent counseling, educating, and coordinating care of the patient.    These notes generated with voice recognition software. I apologize for typographical errors.  Ronny Bacon M.D., FACS 12/11/2021, 12:02 PM

## 2021-12-11 NOTE — Patient Instructions (Signed)
Remove dressing and shower as usual. Re- apply Silvadene cream and dressing to the wound. Do this daily. Please pick up your prescription at the pharmacy.   Please se your follow up appointment listed below.

## 2021-12-11 NOTE — Telephone Encounter (Signed)
Patient's son, Jeronimo Greaves, called. He would like to speak with Dr Christian Mate about his mother. He was with her at her visit today and went over wound care with the physician. He would not elaborate on what he wanted to speak with him about. I let him know that Dr Christian Mate was in surgery and may not get back with him until tomorrow.

## 2021-12-14 ENCOUNTER — Encounter: Payer: Self-pay | Admitting: Internal Medicine

## 2021-12-14 ENCOUNTER — Ambulatory Visit (INDEPENDENT_AMBULATORY_CARE_PROVIDER_SITE_OTHER): Payer: Medicare Other | Admitting: Internal Medicine

## 2021-12-14 ENCOUNTER — Other Ambulatory Visit: Payer: Self-pay

## 2021-12-14 VITALS — BP 106/70 | HR 91 | Temp 98.0°F | Ht 63.0 in | Wt 93.0 lb

## 2021-12-14 DIAGNOSIS — M79601 Pain in right arm: Secondary | ICD-10-CM | POA: Diagnosis not present

## 2021-12-14 DIAGNOSIS — S51811S Laceration without foreign body of right forearm, sequela: Secondary | ICD-10-CM

## 2021-12-14 DIAGNOSIS — Z7901 Long term (current) use of anticoagulants: Secondary | ICD-10-CM | POA: Diagnosis not present

## 2021-12-14 DIAGNOSIS — W540XXD Bitten by dog, subsequent encounter: Secondary | ICD-10-CM | POA: Diagnosis not present

## 2021-12-14 LAB — PROTIME-INR
INR: 2.2 ratio — ABNORMAL HIGH (ref 0.8–1.0)
Prothrombin Time: 22.9 s — ABNORMAL HIGH (ref 9.6–13.1)

## 2021-12-14 MED ORDER — OXYCODONE HCL 5 MG PO TABS: 5.0000 mg | ORAL_TABLET | Freq: Four times a day (QID) | ORAL | 0 refills | Status: DC | PRN

## 2021-12-14 NOTE — Patient Instructions (Signed)
CHANGE DRESSING  ON Sunday    OXYCODONE HAS BEEN REFILLED  WOUND CLINIC REFERRAL HAS BEEN MADE

## 2021-12-14 NOTE — Progress Notes (Addendum)
Subjective:  Patient ID: Tamara [name], female    DOB: 03-Dec-1945  Age: 76 y.o. MRN: [id number]  CC: The primary encounter diagnosis was Skin tear of forearm without complication, right, sequela. Diagnoses of Dog bite, subsequent encounter, Anticoagulated on Coumadin, and Pain in right arm were also pertinent to this visit.   This visit occurred during the SARS-CoV-2 public health emergency.  Safety protocols were in place, including screening questions prior to the visit, additional usage of staff PPE, and extensive cleaning of exam room while observing appropriate contact time as indicated for disinfecting solutions.    HPI Tamara [name] presents for follow up on dog bite to right forearm that required sutures which were recently removed by surgeon .  She continues to have significant pain associated with the skin tear. Chief Complaint  Patient presents with   Follow-up    Follow up on dog bite wound    Events of dig bite reviewed. Tetanus vaccine is up to date .  She has not changed the dressing since the removal of the stitches.    Outpatient Medications Prior to Visit  Medication Sig Dispense Refill   clonazePAM (KLONOPIN) 0.5 MG tablet Take 1 tablet (0.5 mg total) by mouth daily as needed for anxiety. 30 tablet 0   cyanocobalamin (,VITAMIN B-12,) 1000 MCG/ML injection Inject 1 mL (1,000 mcg total) into the muscle every 30 (thirty) days. 10 mL 2   Fluticasone-Salmeterol (ADVAIR) 250-50 MCG/DOSE AEPB Inhale 1 puff into the lungs 2 (two) times daily as needed (shortness of braeth).     gabapentin (NEURONTIN) 300 MG capsule Take 900 mg by mouth at bedtime.     nortriptyline (PAMELOR) 25 MG capsule Take 1 capsule (25 mg total) by mouth at bedtime. 90 capsule 1   omeprazole (PRILOSEC) 40 MG capsule TAKE 1 CAPSULE BY MOUTH TWICE DAILY 180 capsule 1   rosuvastatin (CRESTOR) 10 MG tablet TAKE 1 TABLET BY MOUTH AT BEDTIME 90 tablet 1   sertraline (ZOLOFT) 100 MG tablet TAKE 1 TABLET  BY MOUTH ONCE DAILY (Patient taking differently: Take 100 mg by mouth at bedtime.) 90 tablet 1   silver sulfADIAZINE (SILVADENE) 1 % cream Apply to affected area daily 25 g 0   Syringe/Needle, Disp, (SYRINGE 3CC/25GX1") 25G X 1" 3 ML MISC Use for b12 injections 50 each 0   traZODone (DESYREL) 50 MG tablet TAKE 2 TABLETS BY MOUTH AT BEDTIME 180 tablet 1   warfarin (COUMADIN) 1 MG tablet TAKE 2 TABLET BY MOUTH ONCE DAILY WITH THE 5MG FOR 7MG TOTAL DAILY (Patient taking differently: 7 mg daily) 90 tablet 1   warfarin (COUMADIN) 5 MG tablet Take 1 tablet (5 mg total) by mouth at bedtime. Take with 2 mg to equal 7 mg every night 90 tablet 1   tiotropium (SPIRIVA HANDIHALER) 18 MCG inhalation capsule Place 1 capsule (18 mcg total) into inhaler and inhale daily as needed (shortness of breath). (Patient not taking: Reported on 12/14/2021)     No facility-administered medications prior to visit.    Review of Systems;  Patient denies headache, fevers, malaise, unintentional weight loss, skin rash, eye pain, sinus congestion and sinus pain, sore throat, dysphagia,  hemoptysis , cough, dyspnea, wheezing, chest pain, palpitations, orthopnea, edema, abdominal pain, nausea, melena, diarrhea, constipation, flank pain, dysuria, hematuria, urinary  Frequency, nocturia, numbness, tingling, seizures,  Focal weakness, Loss of consciousness,  Tremor, insomnia, depression, anxiety, and suicidal ideation.      Objective:  BP 106/70 (BP Location:  Left Arm, Patient Position: Sitting, Cuff Size: Normal)    Pulse 91    Temp 98 F (36.7 C) (Oral)    Ht 5' 3"  (1.6 m)    Wt 93 lb (42.2 kg)    SpO2 96%    BMI 16.47 kg/m   BP Readings from Last 3 Encounters:  12/14/21 106/70  12/11/21 127/76  12/10/21 (!) 108/54    Wt Readings from Last 3 Encounters:  12/14/21 93 lb (42.2 kg)  12/11/21 93 lb 6.4 oz (42.4 kg)  12/10/21 95 lb 2 oz (43.1 kg)    General appearance: alert, cooperative and appears stated age Ears: normal  TM's and external ear canals both ears Throat: lips, mucosa, and tongue normal; teeth and gums normal Neck: no adenopathy, no carotid bruit, supple, symmetrical, trachea midline and thyroid not enlarged, symmetric, no tenderness/mass/nodules Back: symmetric, no curvature. ROM normal. No CVA tenderness. Lungs: clear to auscultation bilaterally Heart: regular rate and rhythm, S1, S2 normal, no murmur, click, rub or gallop Abdomen: soft, non-tender; bowel sounds normal; no masses,  no organomegaly Pulses: 2+ and symmetric Skin: right forearm with large skin tear .  Granulattion tissue evident.  No purulent  drainage or erythema. Lymph nodes: Cervical, supraclavicular, and axillary nodes normal.  Lab Results  Component Value Date   HGBA1C 6.1 09/21/2020    Lab Results  Component Value Date   CREATININE 0.68 12/06/2021   CREATININE 0.60 11/02/2021   CREATININE 0.71 09/12/2021    Lab Results  Component Value Date   WBC 7.5 12/10/2021   HGB 13.2 12/10/2021   HCT 40.1 12/10/2021   PLT 208.0 12/10/2021   GLUCOSE 96 12/06/2021   CHOL 188 01/26/2020   TRIG 182.0 (H) 01/26/2020   HDL 58.90 01/26/2020   LDLDIRECT 121.1 01/26/2014   LDLCALC 92 01/26/2020   ALT 26 12/06/2021   AST 32 12/06/2021   NA 137 12/06/2021   K 4.3 12/06/2021   CL 104 12/06/2021   CREATININE 0.68 12/06/2021   BUN 7 12/06/2021   CO2 28 12/06/2021   TSH 1.74 06/11/2017   INR 2.2 (H) 12/14/2021   HGBA1C 6.1 09/21/2020    No results found.  Assessment & Plan:   Problem List Items Addressed This Visit     Anticoagulated on Coumadin   Relevant Orders   Protime-INR (Completed)   Protime-INR   Dog bite   Relevant Orders   Ambulatory referral to Wound Clinic   Pain in right arm    She continue to have significant pain resulting from the superficial injury to her right forearm.  There is no sign of muscle or nerve damage and her distal strength is normal.  Medications refilled.        Skin tear of  forearm without complication, right, sequela - Primary    Wound was irrigated with saline and redressed with silver sulvadine , non stick dressing and cdoban.  Referral to wound clinic made.  RN visit on Tuesday for repeat assessment.       Relevant Orders   Ambulatory referral to Wound Clinic    I spent 30 minutes dedicated to the care of this patient on the date of this encounter to include pre-visit review of patient's medical history,  most recent imaging studies, Face-to-face time with the patient , and post visit ordering of testing and therapeutics.    Follow-up: No follow-ups on file.   Crecencio Mc, MD

## 2021-12-16 DIAGNOSIS — S51811S Laceration without foreign body of right forearm, sequela: Secondary | ICD-10-CM | POA: Insufficient documentation

## 2021-12-16 NOTE — Assessment & Plan Note (Signed)
Wound was irrigated with saline and redressed with silver sulvadine , non stick dressing and cdoban.  Referral to wound clinic made.  RN visit on Tuesday for repeat assessment.

## 2021-12-18 ENCOUNTER — Other Ambulatory Visit: Payer: Self-pay

## 2021-12-18 ENCOUNTER — Ambulatory Visit: Payer: Medicare Other

## 2021-12-18 DIAGNOSIS — S51811S Laceration without foreign body of right forearm, sequela: Secondary | ICD-10-CM

## 2021-12-18 NOTE — Progress Notes (Addendum)
Pt presented today for a wound check on right forearm. Undressed wound, cleaned it up and let Dr. Derrel Nip take a look at it. Dr. Derrel Nip stated that it was healing well and to re dress it. Re dressed the wound. Advised pt to return for wound check again on Friday at 12:15 pm per Dr. Derrel Nip request.    I have reviewed the above information and agree with above.   Deborra Medina, MD

## 2021-12-20 ENCOUNTER — Ambulatory Visit: Payer: Medicare Other | Admitting: Surgery

## 2021-12-21 ENCOUNTER — Other Ambulatory Visit: Payer: Self-pay

## 2021-12-21 ENCOUNTER — Ambulatory Visit: Payer: Medicare Other

## 2021-12-21 DIAGNOSIS — S51811S Laceration without foreign body of right forearm, sequela: Secondary | ICD-10-CM

## 2021-12-21 NOTE — Progress Notes (Signed)
Pt presented today for a wound check on her right forearm. Undressed the wound, cleaned it, put Silvadene Cream on it and redressed the wound. The wound looked much better and appeared to be healing. No signs of infection.   Pt did state that she still has not heard from wound care about an appt.

## 2021-12-24 ENCOUNTER — Ambulatory Visit: Payer: Medicare Other

## 2021-12-24 ENCOUNTER — Encounter: Payer: Self-pay | Admitting: Internal Medicine

## 2021-12-24 DIAGNOSIS — M79601 Pain in right arm: Secondary | ICD-10-CM | POA: Insufficient documentation

## 2021-12-24 NOTE — Assessment & Plan Note (Signed)
She continue to have significant pain resulting from the superficial injury to her right forearm.  There is no sign of muscle or nerve damage and her distal strength is normal.  Medications refilled.

## 2021-12-27 ENCOUNTER — Ambulatory Visit: Payer: Medicare Other

## 2021-12-27 ENCOUNTER — Other Ambulatory Visit: Payer: Self-pay

## 2021-12-27 DIAGNOSIS — S51811S Laceration without foreign body of right forearm, sequela: Secondary | ICD-10-CM

## 2021-12-27 MED ORDER — CLONAZEPAM 0.5 MG PO TABS: 0.5000 mg | ORAL_TABLET | Freq: Every day | ORAL | 0 refills | Status: DC | PRN

## 2021-12-27 MED ORDER — TRAZODONE HCL 50 MG PO TABS: 100.0000 mg | ORAL_TABLET | Freq: Every day | ORAL | 1 refills | Status: DC

## 2021-12-27 NOTE — Progress Notes (Addendum)
Pt presented today for wound check on right forearm. Undressed wound, cleaned the area, and redressed it. The wound is continuing to heal properly. Pt stated that she sees wound care center on Thursday 01/03/2022. Spoke with Dr. Derrel Nip verbally to let her know and find out if pt needed to come back here for wound check before next Thursday. Dr. Derrel Nip advised that as long as she changes it on Sunday she doesn't need to come back to our office next week. Pt has been advised and gave a verbal understanding.    I have reviewed the above information and agree with above.   Deborra Medina, MD

## 2021-12-27 NOTE — Telephone Encounter (Signed)
Refilled: 11/28/2021 Last OV: 12/14/2021 for dog bite, last follow up was 06/08/2021 Next OV: not scheduled

## 2021-12-28 ENCOUNTER — Telehealth: Payer: Self-pay | Admitting: Acute Care

## 2021-12-28 NOTE — Telephone Encounter (Signed)
Contacted patient by phone to schedule annual LDCT.  Patient states she is not able to do it now, as she has upcoming surgery and other health issues.  She will call us when ready to schedule

## 2022-01-03 ENCOUNTER — Other Ambulatory Visit: Payer: Self-pay

## 2022-01-03 ENCOUNTER — Encounter: Payer: Medicare Other | Attending: Physician Assistant | Admitting: Physician Assistant

## 2022-01-03 DIAGNOSIS — X58XXXA Exposure to other specified factors, initial encounter: Secondary | ICD-10-CM | POA: Diagnosis not present

## 2022-01-03 DIAGNOSIS — K5090 Crohn's disease, unspecified, without complications: Secondary | ICD-10-CM | POA: Diagnosis not present

## 2022-01-03 DIAGNOSIS — Z86718 Personal history of other venous thrombosis and embolism: Secondary | ICD-10-CM | POA: Diagnosis not present

## 2022-01-03 DIAGNOSIS — F17200 Nicotine dependence, unspecified, uncomplicated: Secondary | ICD-10-CM | POA: Insufficient documentation

## 2022-01-03 DIAGNOSIS — J449 Chronic obstructive pulmonary disease, unspecified: Secondary | ICD-10-CM | POA: Diagnosis not present

## 2022-01-03 DIAGNOSIS — Z7901 Long term (current) use of anticoagulants: Secondary | ICD-10-CM | POA: Insufficient documentation

## 2022-01-03 DIAGNOSIS — L98492 Non-pressure chronic ulcer of skin of other sites with fat layer exposed: Secondary | ICD-10-CM | POA: Diagnosis not present

## 2022-01-03 DIAGNOSIS — S51811A Laceration without foreign body of right forearm, initial encounter: Secondary | ICD-10-CM | POA: Insufficient documentation

## 2022-01-07 NOTE — Progress Notes (Signed)
CASSY, SPROWL ([id number]) Visit Report for 01/03/2022 Chief Complaint Document Details Patient Name: Tamara [name], Tamara [name]. Date of Service: 01/03/2022 8:45 AM Medical Record Number: [id number] Patient Account Number: [id number] Date of Birth/Sex: 1946/02/04 (76 y.o. F) Treating RN: Carlene Coria Primary Care Provider: Deborra Medina Other Clinician: Referring Provider: Deborra Medina Treating Provider/Extender: Skipper Cliche in Treatment: 0 Information Obtained from: Patient Chief Complaint Right forearm laceration/skin tear Electronic Signature(s) Signed: 01/03/2022 9:29:21 AM By: Worthy Keeler PA-C Entered By: Worthy Keeler on 01/03/2022 09:29:20 Kolk, Mandalyn [name]. ([id number]) -------------------------------------------------------------------------------- Debridement Details Patient Name: Tamara [name]. Date of Service: 01/03/2022 8:45 AM Medical Record Number: [id number] Patient Account Number: [id number] Date of Birth/Sex: 12-11-1945 (76 y.o. F) Treating RN: Carlene Coria Primary Care Provider: Deborra Medina Other Clinician: Referring Provider: Deborra Medina Treating Provider/Extender: Skipper Cliche in Treatment: 0 Debridement Performed for Wound #1 Right,Medial Forearm Assessment: Performed By: Physician Tommie Sams., PA-C Debridement Type: Debridement Level of Consciousness (Pre- Awake and Alert procedure): Pre-procedure Verification/Time Out Yes - 09:35 Taken: Start Time: 09:35 Pain Control: Lidocaine 4% Topical Solution Total Area Debrided (L x W): 2.5 (cm) x 3.2 (cm) = 8 (cm) Tissue and other material Viable, Non-Viable, Slough, Subcutaneous, Skin: Dermis , Skin: Epidermis, Slough debrided: Level: Skin/Subcutaneous Tissue Debridement Description: Excisional Instrument: Curette Bleeding: Minimum Hemostasis Achieved: Pressure End Time: 09:40 Procedural Pain: 0 Post Procedural Pain: 0 Response to Treatment: Procedure was tolerated well Level  of Consciousness (Post- Awake and Alert procedure): Post Debridement Measurements of Total Wound Length: (cm) 2.5 Width: (cm) 3.2 Depth: (cm) 0.1 Volume: (cm) 0.628 Character of Wound/Ulcer Post Debridement: Improved Post Procedure Diagnosis Same as Pre-procedure Electronic Signature(s) Signed: 01/04/2022 4:05:24 PM By: Worthy Keeler PA-C Signed: 01/07/2022 8:01:43 AM By: Carlene Coria RN Entered By: Carlene Coria on 01/03/2022 09:39:52 Ortlieb, Samaira [name]. ([id number]) -------------------------------------------------------------------------------- HPI Details Patient Name: Tamara [name]. Date of Service: 01/03/2022 8:45 AM Medical Record Number: [id number] Patient Account Number: [id number] Date of Birth/Sex: 04-Apr-1946 (76 y.o. F) Treating RN: Carlene Coria Primary Care Provider: Deborra Medina Other Clinician: Referring Provider: Deborra Medina Treating Provider/Extender: Skipper Cliche in Treatment: 0 History of Present Illness HPI Description: 01/03/2022 upon evaluation today patient presents for initial evaluation here in the clinic concerning issues she had around mid January when her 2 little dogs were on her lap and got into a fight. She tried to get them off of her and subsequently through her sweatshirt she sustained a quite significant skin tear to the right forearm. She was seen in the ER where they did suture this as best they could but it was a fairly extensive suture repair and left an area that was still open to heal by second intent. Dr. Derrel Nip has been managing this for her but finally got to the point where she felt like the wound center may be helpful in getting this to close finally. Fortunately I do not see any signs of active infection locally nor systemically at this time. The patient has had apparently a CVA in the past and is on anticoagulants actually not for that before an issue with a blood clot following a surgery on her knee. She also has Crohn's  disease and COPD. Currently she is on Coumadin as an anticoagulant. Electronic Signature(s) Signed: 01/03/2022 9:48:22 AM By: Worthy Keeler PA-C Entered By: Worthy Keeler on 01/03/2022 09:48:22 Biehler, Charman RMarland Kitchen ([id number]) -------------------------------------------------------------------------------- Physical Exam Details Patient Name: Tamara [name]. Date of Service: 01/03/2022 8:45 AM Medical  Record Number: [id number] Patient Account Number: [id number] Date of Birth/Sex: 12/07/1945 (75 y.o. F) Treating RN: Carlene Coria Primary Care Provider: Deborra Medina Other Clinician: Referring Provider: Deborra Medina Treating Provider/Extender: Skipper Cliche in Treatment: 0 Constitutional sitting or standing blood pressure is within target range for patient.. pulse regular and within target range for patient.Marland Kitchen respirations regular, non- labored and within target range for patient.Marland Kitchen temperature within target range for patient.. Well-nourished and well-hydrated in no acute distress. Eyes conjunctiva clear no eyelid edema noted. pupils equal round and reactive to light and accommodation. Ears, Nose, Mouth, and Throat no gross abnormality of ear auricles or external auditory canals. normal hearing noted during conversation. mucus membranes moist. Respiratory normal breathing without difficulty. Musculoskeletal normal gait and posture. no significant deformity or arthritic changes, no loss or range of motion, no clubbing. Psychiatric this patient is able to make decisions and demonstrates good insight into disease process. Alert and Oriented x 3. pleasant and cooperative. Notes Upon inspection patient's wound bed actually showed signs of fairly good granulation and epithelization at this point. Fortunately I do not see any signs of infection there was some slough noted I did perform sharp debridement to clear away the necrotic debris here. She actually tolerated that debridement today  without complication and overall I feel like that she is doing quite well. I think this is going to allow this to heal much more effectively and quickly. I do believe Xeroform may be ideal for her. Electronic Signature(s) Signed: 01/03/2022 9:49:18 AM By: Worthy Keeler PA-C Entered By: Worthy Keeler on 01/03/2022 09:49:17 Tietze, Tonjua Alfonso Patten ([id number]) -------------------------------------------------------------------------------- Physician Orders Details Patient Name: Tamara [name]. Date of Service: 01/03/2022 8:45 AM Medical Record Number: [id number] Patient Account Number: [id number] Date of Birth/Sex: [birth year] (76 y.o. F) Treating RN: Carlene Coria Primary Care Provider: Deborra Medina Other Clinician: Referring Provider: Deborra Medina Treating Provider/Extender: Skipper Cliche in Treatment: 0 Verbal / Phone Orders: No Diagnosis Coding ICD-10 Coding Code Description [phone] Laceration without foreign body of right forearm, initial encounter J44.9 Chronic obstructive pulmonary disease, unspecified K50.90 Crohn's disease, unspecified, without complications O75.64 Long term (current) use of anticoagulants Follow-up Appointments o Return Appointment in 1 week. Bathing/ Shower/ Hygiene o May shower; gently cleanse wound with antibacterial soap, rinse and pat dry prior to dressing wounds Wound Treatment Wound #1 - Forearm Wound Laterality: Right, Medial Primary Dressing: Xeroform 5x9-HBD (in/in) (DME) (Generic) 1 x Per Day/30 Days Discharge Instructions: Apply Xeroform 5x9-HBD (in/in) as directed Secondary Dressing: Gauze 1 x Per Day/30 Days Discharge Instructions: As directed: dry, moistened with saline or moistened with Dakins Solution Secondary Dressing: Zetuvit Plus Silicone Border Dressing 4x4 (in/in) (DME) (Generic) 1 x Per Day/30 Days Electronic Signature(s) Signed: 01/04/2022 4:05:24 PM By: Worthy Keeler PA-C Signed: 01/07/2022 8:01:43 AM By: Carlene Coria  RN Entered By: Carlene Coria on 01/03/2022 09:43:59 Stockard, Shelba [name]. ([id number]) -------------------------------------------------------------------------------- Problem List Details Patient Name: Tamara [name]. Date of Service: 01/03/2022 8:45 AM Medical Record Number: [id number] Patient Account Number: [id number] Date of Birth/Sex: January 10, 1946 (76 y.o. F) Treating RN: Carlene Coria Primary Care Provider: Deborra Medina Other Clinician: Referring Provider: Deborra Medina Treating Provider/Extender: Skipper Cliche in Treatment: 0 Active Problems ICD-10 Encounter Code Description Active Date MDM Diagnosis S51.811A Laceration without foreign body of right forearm, initial encounter 01/03/2022 No Yes J44.9 Chronic obstructive pulmonary disease, unspecified 01/03/2022 No Yes K50.90 Crohn's disease, unspecified, without complications 0/08/9322 No Yes Z79.01 Long term (current) use of anticoagulants 01/03/2022 No Yes  Inactive Problems Resolved Problems Electronic Signature(s) Signed: 01/03/2022 9:30:49 AM By: Worthy Keeler PA-C Previous Signature: 01/03/2022 9:29:01 AM Version By: Worthy Keeler PA-C Entered By: Worthy Keeler on 01/03/2022 09:30:48 Ashmead, Kayanna [name]. ([id number]) -------------------------------------------------------------------------------- Progress Note Details Patient Name: Tamara [name]. Date of Service: 01/03/2022 8:45 AM Medical Record Number: [id number] Patient Account Number: [id number] Date of Birth/Sex: 1946-03-11 (76 y.o. F) Treating RN: Carlene Coria Primary Care Provider: Deborra Medina Other Clinician: Referring Provider: Deborra Medina Treating Provider/Extender: Skipper Cliche in Treatment: 0 Subjective Chief Complaint Information obtained from Patient Right forearm laceration/skin tear History of Present Illness (HPI) 01/03/2022 upon evaluation today patient presents for initial evaluation here in the clinic concerning issues she had around  mid January when her 2 little dogs were on her lap and got into a fight. She tried to get them off of her and subsequently through her sweatshirt she sustained a quite significant skin tear to the right forearm. She was seen in the ER where they did suture this as best they could but it was a fairly extensive suture repair and left an area that was still open to heal by second intent. Dr. Derrel Nip has been managing this for her but finally got to the point where she felt like the wound center may be helpful in getting this to close finally. Fortunately I do not see any signs of active infection locally nor systemically at this time. The patient has had apparently a CVA in the past and is on anticoagulants actually not for that before an issue with a blood clot following a surgery on her knee. She also has Crohn's disease and COPD. Currently she is on Coumadin as an anticoagulant. Patient History Allergies Sulfa (Sulfonamide Antibiotics), Eliquis Social History Current every day smoker, Marital Status - Widowed, Alcohol Use - Never, Drug Use - No History, Caffeine Use - Daily. Medical History Respiratory Patient has history of Chronic Obstructive Pulmonary Disease (COPD) Cardiovascular Patient has history of Deep Vein Thrombosis Review of Systems (ROS) Constitutional Symptoms (General Health) Denies complaints or symptoms of Fatigue, Fever, Chills, Marked Weight Change. Objective Constitutional sitting or standing blood pressure is within target range for patient.. pulse regular and within target range for patient.Marland Kitchen respirations regular, non- labored and within target range for patient.Marland Kitchen temperature within target range for patient.. Well-nourished and well-hydrated in no acute distress. Vitals Time Taken: 9:08 AM, Height: 61 in, Source: Stated, Weight: 92 lbs, Source: Stated, BMI: 17.4, Temperature: 98.6 F, Pulse: 96 bpm, Respiratory Rate: 18 breaths/min, Blood Pressure: 102/64  mmHg. Eyes conjunctiva clear no eyelid edema noted. pupils equal round and reactive to light and accommodation. Ears, Nose, Mouth, and Throat no gross abnormality of ear auricles or external auditory canals. normal hearing noted during conversation. mucus membranes moist. Respiratory normal breathing without difficulty. Musculoskeletal normal gait and posture. no significant deformity or arthritic changes, no loss or range of motion, no clubbing. MELIYAH, SIMON ([id number]) Psychiatric this patient is able to make decisions and demonstrates good insight into disease process. Alert and Oriented x 3. pleasant and cooperative. General Notes: Upon inspection patient's wound bed actually showed signs of fairly good granulation and epithelization at this point. Fortunately I do not see any signs of infection there was some slough noted I did perform sharp debridement to clear away the necrotic debris here. She actually tolerated that debridement today without complication and overall I feel like that she is doing quite well. I think this is going to allow  this to heal much more effectively and quickly. I do believe Xeroform may be ideal for her. Integumentary (Hair, Skin) Wound #1 status is Open. Original cause of wound was Trauma. The date acquired was: 11/28/2021. The wound is located on the Right,Medial Forearm. The wound measures 2.5cm length x 3.2cm width x 0.1cm depth; 6.283cm^2 area and 0.628cm^3 volume. There is Fat Layer (Subcutaneous Tissue) exposed. There is no tunneling or undermining noted. There is a medium amount of serosanguineous drainage noted. There is medium (34-66%) pink granulation within the wound bed. There is a medium (34-66%) amount of necrotic tissue within the wound bed including Adherent Slough. Assessment Active Problems ICD-10 Laceration without foreign body of right forearm, initial encounter Chronic obstructive pulmonary disease, unspecified Crohn's disease,  unspecified, without complications Long term (current) use of anticoagulants Procedures Wound #1 Pre-procedure diagnosis of Wound #1 is a Skin Tear located on the Right,Medial Forearm . There was a Excisional Skin/Subcutaneous Tissue Debridement with a total area of 8 sq cm performed by Tommie Sams., PA-C. With the following instrument(s): Curette to remove Viable and Non-Viable tissue/material. Material removed includes Subcutaneous Tissue, Slough, Skin: Dermis, and Skin: Epidermis after achieving pain control using Lidocaine 4% Topical Solution. No specimens were taken. A time out was conducted at 09:35, prior to the start of the procedure. A Minimum amount of bleeding was controlled with Pressure. The procedure was tolerated well with a pain level of 0 throughout and a pain level of 0 following the procedure. Post Debridement Measurements: 2.5cm length x 3.2cm width x 0.1cm depth; 0.628cm^3 volume. Character of Wound/Ulcer Post Debridement is improved. Post procedure Diagnosis Wound #1: Same as Pre-Procedure Plan Follow-up Appointments: Return Appointment in 1 week. Bathing/ Shower/ Hygiene: May shower; gently cleanse wound with antibacterial soap, rinse and pat dry prior to dressing wounds WOUND #1: - Forearm Wound Laterality: Right, Medial Primary Dressing: Xeroform 5x9-HBD (in/in) (DME) (Generic) 1 x Per Day/30 Days Discharge Instructions: Apply Xeroform 5x9-HBD (in/in) as directed Secondary Dressing: Gauze 1 x Per Day/30 Days Discharge Instructions: As directed: dry, moistened with saline or moistened with Dakins Solution Secondary Dressing: Zetuvit Plus Silicone Border Dressing 4x4 (in/in) (DME) (Generic) 1 x Per Day/30 Days 1. Based on what I am seeing postdebridement I think Xeroform probably is good to be our best option here I feel like collagen may get too dry and stuck to the wound bed which would not benefit her. 2. I am also can recommend that we have the patient use a  Zetuvit border foam dressing which will be safe to cover the area I think this will protect it nice and well as well. We will see patient back for reevaluation in 1 week here in the clinic. If anything worsens or changes patient will contact our office for additional recommendations. ELIORA, NIENHUIS ([id number]) Electronic Signature(s) Signed: 01/03/2022 9:53:52 AM By: Worthy Keeler PA-C Entered By: Worthy Keeler on 01/03/2022 09:53:52 Woodlief, Victoriya [name]. ([id number]) -------------------------------------------------------------------------------- ROS/PFSH Details Patient Name: [name]. Date of Service: 01/03/2022 8:45 AM Medical Record Number: [id number] Patient Account Number: [id number] Date of Birth/Sex: [birth year] (76 y.o. F) Treating RN: Carlene Coria Primary Care Provider: Deborra Medina Other Clinician: Referring Provider: Deborra Medina Treating Provider/Extender: Skipper Cliche in Treatment: 0 Constitutional Symptoms (General Health) Complaints and Symptoms: Negative for: Fatigue; Fever; Chills; Marked Weight Change Respiratory Medical History: Positive for: Chronic Obstructive Pulmonary Disease (COPD) Cardiovascular Medical History: Positive for: Deep Vein Thrombosis Immunizations Pneumococcal Vaccine: Received Pneumococcal Vaccination: No Implantable  Devices None Family and Social History Current every day smoker; Marital Status - Widowed; Alcohol Use: Never; Drug Use: No History; Caffeine Use: Daily Electronic Signature(s) Signed: 01/04/2022 4:05:24 PM By: Worthy Keeler PA-C Signed: 01/07/2022 8:01:43 AM By: Carlene Coria RN Entered By: Carlene Coria on 01/03/2022 09:12:41 Justin, Shavonne [name]. ([id number]) -------------------------------------------------------------------------------- SuperBill Details Patient Name: Tamara [name]. Date of Service: 01/03/2022 Medical Record Number: [id number] Patient Account Number: [id number] Date of Birth/Sex:  [birth year] (76 y.o. F) Treating RN: Carlene Coria Primary Care Provider: Deborra Medina Other Clinician: Referring Provider: Deborra Medina Treating Provider/Extender: Skipper Cliche in Treatment: 0 Diagnosis Coding ICD-10 Codes Code Description [phone] Laceration without foreign body of right forearm, initial encounter J44.9 Chronic obstructive pulmonary disease, unspecified K50.90 Crohn's disease, unspecified, without complications M08.67 Long term (current) use of anticoagulants Facility Procedures CPT4 Code: [id number] Description: 11042 - DEB SUBQ TISSUE 20 SQ CM/< Modifier: Quantity: 1 CPT4 Code: Description: ICD-10 Diagnosis Description S51.811A Laceration without foreign body of right forearm, initial encounter Modifier: Quantity: Physician Procedures CPT4 Code: [id number] Description: WC PHYS LEVEL 3 o NEW PT Modifier: 25 Quantity: 1 CPT4 Code: Description: ICD-10 Diagnosis Description S51.811A Laceration without foreign body of right forearm, initial encounter J44.9 Chronic obstructive pulmonary disease, unspecified K50.90 Crohn's disease, unspecified, without complications K99.83 Long term  (current) use of anticoagulants Modifier: Quantity: CPT4 Code: [id number] Description: 11042 - WC PHYS SUBQ TISS 20 SQ CM Modifier: Quantity: 1 CPT4 Code: Description: ICD-10 Diagnosis Description S51.811A Laceration without foreign body of right forearm, initial encounter Modifier: Quantity: Electronic Signature(s) Signed: 01/03/2022 9:54:16 AM By: Worthy Keeler PA-C Entered By: Worthy Keeler on 01/03/2022 09:54:16

## 2022-01-07 NOTE — Progress Notes (Signed)
Tamara [name], Tamara [name] ([id number]) Visit Report for 01/03/2022 Allergy List Details Patient Name: Tamara [name], Tamara [name]. Date of Service: 01/03/2022 8:45 AM Medical Record Number: [id number] Patient Account Number: [id number] Date of Birth/Sex: 10/22/1946 (76 y.o. F) Treating RN: Tamara [name] Primary Care Tamara [name]: Tamara [name] Other Clinician: Referring Tamara [name]: Tamara [name] Treating An Tamara [name]: Tamara [name] Weeks in Treatment: 0 Allergies Active Allergies Sulfa (Sulfonamide Antibiotics) Eliquis Allergy Notes Electronic Signature(s) Signed: 01/07/2022 8:01:43 AM By: Tamara Coria RN Entered By: Tamara [name] on 01/03/2022 09:10:50 Tamara [name], Tamara [name] Kitchen ([id number]) -------------------------------------------------------------------------------- Arrival Information Details Patient Name: Tamara [name]. Date of Service: 01/03/2022 8:45 AM Medical Record Number: [id number] Patient Account Number: [id number] Date of Birth/Sex: [birth year] (76 y.o. F) Treating RN: Tamara [name] Primary Care Tamara [name]: Tamara [name] Other Clinician: Referring Tamara [name]: Tamara [name] Treating Tamara [name]: Tamara [name] in Treatment: 0 Visit Information Patient Arrived: Ambulatory Arrival Time: 09:05 Accompanied By: son Transfer Assistance: None Patient Identification Verified: Yes Secondary Verification Process Completed: Yes Patient Requires Transmission-Based Precautions: No Patient Has Alerts: No Electronic Signature(s) Signed: 01/07/2022 8:01:43 AM By: Tamara Coria RN Entered By: Tamara [name] on 01/03/2022 09:08:27 Tamara [name], [name] ([id number]) -------------------------------------------------------------------------------- Clinic Level of Care Assessment Details Patient Name: Tamara Repress R. Date of Service: 01/03/2022 8:45 AM Medical Record Number: [id number] Patient Account Number: [id number] Date of Birth/Sex: 1946/05/01 (76 y.o. F) Treating RN: Tamara [name] Primary Care  Kenson Groh: Tamara [name] Other Clinician: Referring Tamara Phegley: Tamara [name] Treating Sadia Belfiore/Extender: Tamara [name] in Treatment: 0 Clinic Level of Care Assessment Items TOOL 4 Quantity Score []  - Use when only an EandM is performed on FOLLOW-UP visit 0 ASSESSMENTS - Nursing Assessment / Reassessment []  - Reassessment of Co-morbidities (includes updates in patient status) 0 []  - 0 Reassessment of Adherence to Treatment Plan ASSESSMENTS - Wound and Skin Assessment / Reassessment []  - Simple Wound Assessment / Reassessment - one wound 0 []  - 0 Complex Wound Assessment / Reassessment - multiple wounds []  - 0 Dermatologic / Skin Assessment (not related to wound area) ASSESSMENTS - Focused Assessment []  - Circumferential Edema Measurements - multi extremities 0 []  - 0 Nutritional Assessment / Counseling / Intervention []  - 0 Lower Extremity Assessment (monofilament, tuning fork, pulses) []  - 0 Peripheral Arterial Disease Assessment (using hand held doppler) ASSESSMENTS - Ostomy and/or Continence Assessment and Care []  - Incontinence Assessment and Management 0 []  - 0 Ostomy Care Assessment and Management (repouching, etc.) PROCESS - Coordination of Care []  - Simple Patient / Family Education for ongoing care 0 []  - 0 Complex (extensive) Patient / Family Education for ongoing care []  - 0 Staff obtains Programmer, systems, Records, Test Results / Process Orders []  - 0 Staff telephones HHA, Nursing Homes / Clarify orders / etc []  - 0 Routine Transfer to another Facility (non-emergent condition) []  - 0 Routine Hospital Admission (non-emergent condition) []  - 0 New Admissions / Biomedical engineer / Ordering NPWT, Apligraf, etc. []  - 0 Emergency Hospital Admission (emergent condition) []  - 0 Simple Discharge Coordination []  - 0 Complex (extensive) Discharge Coordination PROCESS - Special Needs []  - Pediatric / Minor Patient Management 0 []  - 0 Isolation Patient Management []   - 0 Hearing / Language / Visual special needs []  - 0 Assessment of Community assistance (transportation, D/C planning, etc.) []  - 0 Additional assistance / Altered mentation []  - 0 Support Surface(s) Assessment (bed, cushion, seat, etc.) INTERVENTIONS - Wound Cleansing / Measurement Stracener, [name] ([id number]) []  - 0 Simple Wound Cleansing - one wound []  - 0 Complex Wound Cleansing -  multiple wounds []  - 0 Wound Imaging (photographs - any number of wounds) []  - 0 Wound Tracing (instead of photographs) []  - 0 Simple Wound Measurement - one wound []  - 0 Complex Wound Measurement - multiple wounds INTERVENTIONS - Wound Dressings []  - Small Wound Dressing one or multiple wounds 0 []  - 0 Medium Wound Dressing one or multiple wounds []  - 0 Large Wound Dressing one or multiple wounds []  - 0 Application of Medications - topical []  - 0 Application of Medications - injection INTERVENTIONS - Miscellaneous []  - External ear exam 0 []  - 0 Specimen Collection (cultures, biopsies, blood, body fluids, etc.) []  - 0 Specimen(s) / Culture(s) sent or taken to Lab for analysis []  - 0 Patient Transfer (multiple staff / Civil Service fast streamer / Similar devices) []  - 0 Simple Staple / Suture removal (25 or less) []  - 0 Complex Staple / Suture removal (26 or more) []  - 0 Hypo / Hyperglycemic Management (close monitor of Blood Glucose) []  - 0 Ankle / Brachial Index (ABI) - do not check if billed separately []  - 0 Vital Signs Has the patient been seen at the hospital within the last three years: Yes Total Score: 0 Level Of Care: ____ Electronic Signature(s) Signed: 01/07/2022 8:01:43 AM By: Tamara Coria RN Entered By: Tamara [name] on 01/03/2022 09:41:26 Tamara [name], [name] ([id number]) -------------------------------------------------------------------------------- Encounter Discharge Information Details Patient Name: Tamara Repress R. Date of Service: 01/03/2022 8:45 AM Medical Record Number:  [id number] Patient Account Number: [id number] Date of Birth/Sex: [birth year] (76 y.o. F) Treating RN: Tamara [name] Primary Care Delise Simenson: Tamara [name] Other Clinician: Referring Nidia Grogan: Tamara [name] Treating Xariah Silvernail/Extender: Tamara [name] in Treatment: 0 Encounter Discharge Information Items Post Procedure Vitals Discharge Condition: Stable Temperature (F): 98.6 Ambulatory Status: Ambulatory Pulse (bpm): 96 Discharge Destination: Home Respiratory Rate (breaths/min): 18 Transportation: Private Auto Blood Pressure (mmHg): 102/64 Accompanied By: son Schedule Follow-up Appointment: Yes Clinical Summary of Care: Patient Declined Electronic Signature(s) Signed: 01/07/2022 8:01:43 AM By: Tamara Coria RN Entered By: Tamara [name] on 01/03/2022 09:43:01 [place], [place]. ([id number]) -------------------------------------------------------------------------------- Lower Extremity Assessment Details Patient Name: CHRISTENE, POUNDS R. Date of Service: 01/03/2022 8:45 AM Medical Record Number: [id number] Patient Account Number: [id number] Date of Birth/Sex: [birth year] (76 y.o. F) Treating RN: Tamara [name] Primary Care Nieko Clarin: Tamara [name] Other Clinician: Referring Marlynn Hinckley: Tamara [name] Treating Chanci Ojala/Extender: Tamara [name] Weeks in Treatment: 0 Electronic Signature(s) Signed: 01/07/2022 8:01:43 AM By: Tamara Coria RN Entered By: Tamara [name] on 01/03/2022 09:14:20 [place], Geralene R. ([id number]) -------------------------------------------------------------------------------- Multi Wound Chart Details Patient Name: Tamara Repress R. Date of Service: 01/03/2022 8:45 AM Medical Record Number: [id number] Patient Account Number: [id number] Date of Birth/Sex: 05-23-1946 (76 y.o. F) Treating RN: Tamara [name] Primary Care Tyana Butzer: Tamara [name] Other Clinician: Referring Trev Boley: Tamara [name] Treating Olisa Quesnel/Extender: Tamara [name] in Treatment: 0 Vital Signs Height(in):  61 Pulse(bpm): 96 Weight(lbs): 85 Blood Pressure(mmHg): 102/64 Body Mass Index(BMI): 17.4 Temperature(F): 98.6 Respiratory Rate(breaths/min): 18 Photos: [N/A:N/A] Wound Location: Right, Medial Forearm N/A N/A Wounding Event: Trauma N/A N/A Primary Etiology: Skin Tear N/A N/A Comorbid History: Chronic Obstructive Pulmonary N/A N/A Disease (COPD), Deep Vein Thrombosis Date Acquired: 11/28/2021 N/A N/A Weeks of Treatment: 0 N/A N/A Wound Status: Open N/A N/A Wound Recurrence: No N/A N/A Measurements L x W x D (cm) 2.5x3.2x0.1 N/A N/A Area (cm) : 6.283 N/A N/A Volume (cm) : 0.628 N/A N/A Classification: Full Thickness Without Exposed N/A N/A Support Structures Exudate Amount: Medium N/A N/A Exudate Type: Serosanguineous N/A N/A Exudate Color: red,  brown N/A N/A Granulation Amount: Medium (34-66%) N/A N/A Granulation Quality: Pink N/A N/A Necrotic Amount: Medium (34-66%) N/A N/A Exposed Structures: Fat Layer (Subcutaneous Tissue): N/A N/A Yes Fascia: No Tendon: No Muscle: No Joint: No Bone: No Epithelialization: None N/A N/A Treatment Notes Electronic Signature(s) Signed: 01/07/2022 8:01:43 AM By: Tamara Coria RN Entered By: Tamara [name] on 01/03/2022 09:33:32 Tamara [name], Tamara [name] ([id number]) -------------------------------------------------------------------------------- [place] Details Patient Name: Tamara Repress R. Date of Service: 01/03/2022 8:45 AM Medical Record Number: [id number] Patient Account Number: [id number] Date of Birth/Sex: [birth year] (76 y.o. F) Treating RN: Tamara [name] Primary Care Caitland Porchia: Tamara [name] Other Clinician: Referring Lakai Moree: Tamara [name] Treating Uyen Eichholz/Extender: Tamara [name] in Treatment: 0 Active Inactive Wound/Skin Impairment Nursing Diagnoses: Knowledge deficit related to ulceration/compromised skin integrity Goals: Patient/caregiver will verbalize understanding of skin care regimen Date  Initiated: 01/03/2022 Target Resolution Date: 01/31/2022 Goal Status: Active Ulcer/skin breakdown will have a volume reduction of 30% by week 4 Date Initiated: 01/03/2022 Target Resolution Date: 01/31/2022 Goal Status: Active Ulcer/skin breakdown will have a volume reduction of 50% by week 8 Date Initiated: 01/03/2022 Target Resolution Date: 03/03/2022 Goal Status: Active Ulcer/skin breakdown will have a volume reduction of 80% by week 12 Date Initiated: 01/03/2022 Target Resolution Date: 04/02/2022 Goal Status: Active Ulcer/skin breakdown will heal within 14 weeks Date Initiated: 01/03/2022 Target Resolution Date: 05/03/2022 Goal Status: Active Interventions: Assess patient/caregiver ability to obtain necessary supplies Assess patient/caregiver ability to perform ulcer/skin care regimen upon admission and as needed Assess ulceration(s) every visit Notes: Electronic Signature(s) Signed: 01/07/2022 8:01:43 AM By: Tamara Coria RN Entered By: Tamara [name] on 01/03/2022 09:32:56 Tamara [name], [name] ([id number]) -------------------------------------------------------------------------------- Pain Assessment Details Patient Name: Tamara Repress R. Date of Service: 01/03/2022 8:45 AM Medical Record Number: [id number] Patient Account Number: [id number] Date of Birth/Sex: [birth year] (76 y.o. F) Treating RN: Tamara [name] Primary Care Tmya Wigington: Tamara [name] Other Clinician: Referring Sakai Heinle: Tamara [name] Treating Oliverio Cho/Extender: Tamara [name] in Treatment: 0 Active Problems Location of Pain Severity and Description of Pain Patient Has Paino No Site Locations Pain Management and Medication Current Pain Management: Electronic Signature(s) Signed: 01/07/2022 8:01:43 AM By: Tamara Coria RN Entered By: Tamara [name] on 01/03/2022 09:08:46 Tamara [name], Tamara [name] Kitchen ([id number]) -------------------------------------------------------------------------------- Patient/Caregiver Education  Details Patient Name: Tamara [name]. Date of Service: 01/03/2022 8:45 AM Medical Record Number: [id number] Patient Account Number: [id number] Date of Birth/Gender: Jan 31, 1946 (76 y.o. F) Treating RN: Tamara [name] Primary Care Physician: Tamara [name] Other Clinician: Referring Physician: Deborra [name] Treating Physician/Extender: Tamara [name] in Treatment: 0 Education Assessment Education Provided To: Patient Education Topics Provided Wound/Skin Impairment: Methods: Explain/Verbal Responses: State content correctly Electronic Signature(s) Signed: 01/07/2022 8:01:43 AM By: Tamara Coria RN Entered By: Tamara [name] on 01/03/2022 09:41:50 Tamara [name], [name] ([id number]) -------------------------------------------------------------------------------- Wound Assessment Details Patient Name: Tamara [name], [name] Date of Service: 01/03/2022 8:45 AM Medical Record Number: [id number] Patient Account Number: [id number] Date of Birth/Sex: 07/13/1946 (76 y.o. F) Treating RN: Tamara [name] Primary Care Olen Eaves: Tamara [name] Other Clinician: Referring Aubriauna Riner: Tamara [name] Treating Kale Dols/Extender: Tamara [name] in Treatment: 0 Wound Status Wound Number: 1 Primary Skin Tear Etiology: Wound Location: Right, Medial Forearm Wound Status: Open Wounding Event: Trauma Comorbid Chronic Obstructive Pulmonary Disease (COPD), Deep Date Acquired: 11/28/2021 History: Vein Thrombosis Weeks Of Treatment: 0 Clustered Wound: No Photos Wound Measurements Length: (cm) 2.5 Width: (cm) 3.2 Depth: (cm) 0.1 Area: (cm) 6.283 Volume: (cm) 0.628 % Reduction in Area: % Reduction in Volume: Epithelialization: None Tunneling: No Undermining: No Wound Description Classification:  Full Thickness Without Exposed Support Structu Exudate Amount: Medium Exudate Type: Serosanguineous Exudate Color: red, brown res Foul Odor After Cleansing: No Slough/Fibrino Yes Wound Bed Granulation Amount:  Medium (34-66%) Exposed Structure Granulation Quality: Pink Fascia Exposed: No Necrotic Amount: Medium (34-66%) Fat Layer (Subcutaneous Tissue) Exposed: Yes Necrotic Quality: Adherent Slough Tendon Exposed: No Muscle Exposed: No Joint Exposed: No Bone Exposed: No Treatment Notes Wound #1 (Forearm) Wound Laterality: Right, Medial Cleanser Peri-Wound Care Topical Primary Dressing Ellerbe, Malon R. ([id number]) IODOFLEX 0.9% Cadexomer Iodine Pad Discharge Instruction: Apply Iodoflex to wound bed only as directed. Secondary Dressing Gauze Discharge Instruction: As directed: dry, moistened with saline or moistened with Dakins Solution Zetuvit Plus Silicone Border Dressing 4x4 (in/in) Secured With Compression Wrap Compression Stockings Add-Ons Electronic Signature(s) Signed: 01/07/2022 8:01:43 AM By: Tamara Coria RN Entered By: Tamara [name] on 01/03/2022 09:22:02 [place], Rye R. ([id number]) -------------------------------------------------------------------------------- Vitals Details Patient Name: Tamara Repress R. Date of Service: 01/03/2022 8:45 AM Medical Record Number: [id number] Patient Account Number: [id number] Date of Birth/Sex: 08/05/1946 (76 y.o. F) Treating RN: Tamara [name] Primary Care Jonnette Nuon: Tamara [name] Other Clinician: Referring Chandan Fly: Tamara [name] Treating Katrice Goel/Extender: Tamara [name] in Treatment: 0 Vital Signs Time Taken: 09:08 Temperature (F): 98.6 Height (in): 61 Pulse (bpm): 96 Source: Stated Respiratory Rate (breaths/min): 18 Weight (lbs): 92 Blood Pressure (mmHg): 102/64 Source: Stated Reference Range: 80 - 120 mg / dl Body Mass Index (BMI): 17.4 Electronic Signature(s) Signed: 01/07/2022 8:01:43 AM By: Tamara Coria RN Entered By: Tamara [name] on 01/03/2022 30:94:07

## 2022-01-07 NOTE — Progress Notes (Signed)
Tamara [name], Tamara [name] ([id number]) Visit Report for 01/03/2022 Abuse Risk Screen Details Patient Name: Tamara [name], Tamara [name]. Date of Service: 01/03/2022 8:45 AM Medical Record Number: [id number] Patient Account Number: [id number] Date of Birth/Sex: 21-Dec-1945 (76 y.o. F) Treating RN: Carlene Coria Primary Care Shaaron Golliday: Deborra Medina Other Clinician: Referring Boyd Litaker: Deborra Medina Treating Jaquitta Dupriest/Extender: Skipper Cliche in Treatment: 0 Abuse Risk Screen Items Answer ABUSE RISK SCREEN: Has anyone close to you tried to hurt or harm you recentlyo No Do you feel uncomfortable with anyone in your familyo No Has anyone forced you do things that you didnot want to doo No Electronic Signature(s) Signed: 01/07/2022 8:01:43 AM By: Carlene Coria RN Entered By: Carlene Coria on 01/03/2022 09:12:50 Tamara [name], [name] ([id number]) -------------------------------------------------------------------------------- Activities of Daily Living Details Patient Name: Tamara [name], [name] Date of Service: 01/03/2022 8:45 AM Medical Record Number: [id number] Patient Account Number: [id number] Date of Birth/Sex: Sep 04, 1946 (76 y.o. F) Treating RN: Carlene Coria Primary Care Jisel Fleet: Deborra Medina Other Clinician: Referring Idriss Quackenbush: Deborra Medina Treating Dwana Garin/Extender: Skipper Cliche in Treatment: 0 Activities of Daily Living Items Answer Activities of Daily Living (Please select one for each item) Drive Automobile Completely Able Take Medications Completely Able Use Telephone Completely Able Care for Appearance Completely Able Use Toilet Completely Able Bath / Shower Completely Able Dress Self Completely Able Feed Self Completely Able Walk Completely Able Get In / Out Bed Completely Able Housework Completely Able Prepare Meals Completely Able Handle Money Completely Able Shop for Self Completely Able Electronic Signature(s) Signed: 01/07/2022 8:01:43 AM By: Carlene Coria RN Entered By: Carlene Coria on 01/03/2022 09:13:13 Tamara [name], [name] ([id number]) -------------------------------------------------------------------------------- Education Screening Details Patient Name: Tamara Repress R. Date of Service: 01/03/2022 8:45 AM Medical Record Number: [id number] Patient Account Number: [id number] Date of Birth/Sex: 10-16-1946 (76 y.o. F) Treating RN: Carlene Coria Primary Care Takayla Baillie: Deborra Medina Other Clinician: Referring Juaquin Ludington: Deborra Medina Treating Haeden Hudock/Extender: Skipper Cliche in Treatment: 0 Primary Learner Assessed: Patient Learning Preferences/Education Level/Primary Language Learning Preference: Explanation Highest Education Level: High School Preferred Language: English Cognitive Barrier Language Barrier: No Translator Needed: No Memory Deficit: No Emotional Barrier: No Cultural/Religious Beliefs Affecting Medical Care: No Physical Barrier Impaired Vision: No Impaired Hearing: No Decreased Hand dexterity: No Knowledge/Comprehension Knowledge Level: Medium Comprehension Level: High Ability to understand written instructions: High Ability to understand verbal instructions: High Motivation Anxiety Level: Anxious Cooperation: Cooperative Education Importance: Acknowledges Need Interest in Health Problems: Asks Questions Perception: Coherent Willingness to Engage in Self-Management High Activities: Readiness to Engage in Self-Management High Activities: Electronic Signature(s) Signed: 01/07/2022 8:01:43 AM By: Carlene Coria RN Entered By: Carlene Coria on 01/03/2022 09:13:41 Tamara [name], Tamara [name] ([id number]) -------------------------------------------------------------------------------- Fall Risk Assessment Details Patient Name: Tamara Nam. Date of Service: 01/03/2022 8:45 AM Medical Record Number: [id number] Patient Account Number: [id number] Date of Birth/Sex: May 10, 1946 (76 y.o. F) Treating RN: Carlene Coria Primary Care Asja Frommer:  Deborra Medina Other Clinician: Referring Daric Koren: Deborra Medina Treating Armari Fussell/Extender: Skipper Cliche in Treatment: 0 Fall Risk Assessment Items Have you had 2 or more falls in the last 12 monthso 0 No Have you had any fall that resulted in injury in the last 12 monthso 0 No FALLS RISK SCREEN History of falling - immediate or within 3 months 0 No Secondary diagnosis (Do you have 2 or more medical diagnoseso) 0 No Ambulatory aid None/bed rest/wheelchair/nurse 0 No Crutches/cane/walker 0 No Furniture 0 No Intravenous therapy Access/Saline/Heparin Lock 0 No Gait/Transferring Normal/ bed rest/ wheelchair 0 No Weak (short steps with or  without shuffle, stooped but able to lift head while walking, may 0 No seek support from furniture) Impaired (short steps with shuffle, may have difficulty arising from chair, head down, impaired 0 No balance) Mental Status Oriented to own ability 0 No Electronic Signature(s) Signed: 01/07/2022 8:01:43 AM By: Carlene Coria RN Entered By: Carlene Coria on 01/03/2022 09:13:47 Tamara [name], [name] ([id number]) -------------------------------------------------------------------------------- Foot Assessment Details Patient Name: Tamara Repress R. Date of Service: 01/03/2022 8:45 AM Medical Record Number: [id number] Patient Account Number: [id number] Date of Birth/Sex: 05/24/1946 (76 y.o. F) Treating RN: Carlene Coria Primary Care Evin Chirco: Deborra Medina Other Clinician: Referring Rielly Brunn: Deborra Medina Treating Quyen Cutsforth/Extender: Skipper Cliche in Treatment: 0 Foot Assessment Items Site Locations + = Sensation present, - = Sensation absent, C = Callus, U = Ulcer R = Redness, W = Warmth, M = Maceration, PU = Pre-ulcerative lesion F = Fissure, S = Swelling, D = Dryness Assessment Right: Left: Other Deformity: No No Prior Foot Ulcer: No No Prior Amputation: No No Charcot Joint: No No Ambulatory Status: Ambulatory Without Help Gait:  Steady Electronic Signature(s) Signed: 01/07/2022 8:01:43 AM By: Carlene Coria RN Entered By: Carlene Coria on 01/03/2022 09:14:07 Tamara [name], [name] ([id number]) -------------------------------------------------------------------------------- Nutrition Risk Screening Details Patient Name: Tamara [name], [name] Date of Service: 01/03/2022 8:45 AM Medical Record Number: [id number] Patient Account Number: [id number] Date of Birth/Sex: August 15, 1946 (76 y.o. F) Treating RN: Carlene Coria Primary Care Maylyn Narvaiz: Deborra Medina Other Clinician: Referring Aiva Miskell: Deborra Medina Treating Alanis Clift/Extender: Skipper Cliche in Treatment: 0 Height (in): 61 Weight (lbs): 92 Body Mass Index (BMI): 17.4 Nutrition Risk Screening Items Score Screening NUTRITION RISK SCREEN: I have an illness or condition that made me change the kind and/or amount of food I eat 0 No I eat fewer than two meals per day 0 No I eat few fruits and vegetables, or milk products 0 No I have three or more drinks of beer, liquor or wine almost every day 0 No I have tooth or mouth problems that make it hard for me to eat 0 No I don't always have enough money to buy the food I need 0 No I eat alone most of the time 0 No I take three or more different prescribed or over-the-counter drugs a day 1 Yes Without wanting to, I have lost or gained 10 pounds in the last six months 0 No I am not always physically able to shop, cook and/or feed myself 0 No Nutrition Protocols Good Risk Protocol 0 No interventions needed Moderate Risk Protocol High Risk Proctocol Risk Level: Good Risk Score: 1 Electronic Signature(s) Signed: 01/07/2022 8:01:43 AM By: Carlene Coria RN Entered By: Carlene Coria on 01/03/2022 09:13:57

## 2022-01-09 DIAGNOSIS — Z7901 Long term (current) use of anticoagulants: Secondary | ICD-10-CM | POA: Diagnosis not present

## 2022-01-10 ENCOUNTER — Encounter: Payer: Medicare Other | Attending: Physician Assistant | Admitting: Physician Assistant

## 2022-01-10 ENCOUNTER — Other Ambulatory Visit: Payer: Self-pay

## 2022-01-10 DIAGNOSIS — M48062 Spinal stenosis, lumbar region with neurogenic claudication: Secondary | ICD-10-CM | POA: Diagnosis not present

## 2022-01-10 DIAGNOSIS — X58XXXA Exposure to other specified factors, initial encounter: Secondary | ICD-10-CM | POA: Insufficient documentation

## 2022-01-10 DIAGNOSIS — J449 Chronic obstructive pulmonary disease, unspecified: Secondary | ICD-10-CM | POA: Insufficient documentation

## 2022-01-10 DIAGNOSIS — K5090 Crohn's disease, unspecified, without complications: Secondary | ICD-10-CM | POA: Diagnosis not present

## 2022-01-10 DIAGNOSIS — Z7901 Long term (current) use of anticoagulants: Secondary | ICD-10-CM | POA: Diagnosis not present

## 2022-01-10 DIAGNOSIS — S51811A Laceration without foreign body of right forearm, initial encounter: Secondary | ICD-10-CM | POA: Insufficient documentation

## 2022-01-10 DIAGNOSIS — L98492 Non-pressure chronic ulcer of skin of other sites with fat layer exposed: Secondary | ICD-10-CM | POA: Diagnosis not present

## 2022-01-10 NOTE — Progress Notes (Addendum)
[name], [name] ([id number]) Visit Report for 01/10/2022 Chief Complaint Document Details Patient Name: Tamara [name], Tamara [name]. Date of Service: 01/10/2022 12:30 PM Medical Record Number: [id number] Patient Account Number: [id number] Date of Birth/Sex: 03-10-1946 (76 y.o. F) Treating RN: Carlene Coria Primary Care Provider: Deborra Medina Other Clinician: Referring Provider: Deborra Medina Treating Provider/Extender: Skipper Cliche in Treatment: 1 Information Obtained from: Patient Chief Complaint Right forearm laceration/skin tear Electronic Signature(s) Signed: 01/10/2022 12:59:38 PM By: Worthy Keeler PA-C Entered By: Worthy Keeler on 01/10/2022 12:59:37 Paczkowski, Davonda R. ([id number]) -------------------------------------------------------------------------------- HPI Details Patient Name: Tamara [name]. Date of Service: 01/10/2022 12:30 PM Medical Record Number: [id number] Patient Account Number: [id number] Date of Birth/Sex: 09-Sep-1946 (76 y.o. F) Treating RN: Carlene Coria Primary Care Provider: Deborra Medina Other Clinician: Referring Provider: Deborra Medina Treating Provider/Extender: Skipper Cliche in Treatment: 1 History of Present Illness HPI Description: 01/03/2022 upon evaluation today patient presents for initial evaluation here in the clinic concerning issues she had around mid January when her 2 little dogs were on her lap and got into a fight. She tried to get them off of her and subsequently through her sweatshirt she sustained a quite significant skin tear to the right forearm. She was seen in the ER where they did suture this as best they could but it was a fairly extensive suture repair and left an area that was still open to heal by second intent. Dr. Derrel Nip has been managing this for her but finally got to the point where she felt like the wound center may be helpful in getting this to close finally. Fortunately I do not see any signs of active infection locally  nor systemically at this time. The patient has had apparently a CVA in the past and is on anticoagulants actually not for that before an issue with a blood clot following a surgery on her knee. She also has Crohn's disease and COPD. Currently she is on Coumadin as an anticoagulant. 01/10/2022 upon evaluation today patient appears to be doing well with regard to her wounds. She has been tolerating the dressing changes without complication. Fortunately there does not appear to be any evidence of active infection locally nor systemically at this point which is great news. No fevers, chills, nausea, vomiting, or diarrhea. Electronic Signature(s) Signed: 01/10/2022 1:18:33 PM By: Worthy Keeler PA-C Entered By: Worthy Keeler on 01/10/2022 13:18:33 Cianci, Madason RMarland Kitchen ([id number]) -------------------------------------------------------------------------------- Physical Exam Details Patient Name: Tamara Repress R. Date of Service: 01/10/2022 12:30 PM Medical Record Number: [id number] Patient Account Number: [id number] Date of Birth/Sex: [birth year] (76 y.o. F) Treating RN: Carlene Coria Primary Care Provider: Deborra Medina Other Clinician: Referring Provider: Deborra Medina Treating Provider/Extender: Skipper Cliche in Treatment: 1 Constitutional Well-nourished and well-hydrated in no acute distress. Respiratory normal breathing without difficulty. Psychiatric this patient is able to make decisions and demonstrates good insight into disease process. Alert and Oriented x 3. pleasant and cooperative. Notes Upon inspection patient's wound bed actually showed signs of good granulation and epithelization at this point. Fortunately there does not appear to be any evidence of active infection locally nor systemically which is great news and overall very pleased with where things stand here currently. Electronic Signature(s) Signed: 01/10/2022 1:18:47 PM By: Worthy Keeler PA-C Entered By: Worthy Keeler  on 01/10/2022 13:18:47 Platner, Cheyanne RMarland Kitchen ([id number]) -------------------------------------------------------------------------------- Physician Orders Details Patient Name: Tamara Repress R. Date of Service: 01/10/2022 12:30 PM Medical Record Number: [id number] Patient Account Number: [id number] Date of Birth/Sex:  July 15, 1946 (76 y.o. F) Treating RN: Carlene Coria Primary Care Provider: Deborra Medina Other Clinician: Referring Provider: Deborra Medina Treating Provider/Extender: Skipper Cliche in Treatment: 1 Verbal / Phone Orders: No Diagnosis Coding ICD-10 Coding Code Description [phone] Laceration without foreign body of right forearm, initial encounter J44.9 Chronic obstructive pulmonary disease, unspecified K50.90 Crohn's disease, unspecified, without complications A19.37 Long term (current) use of anticoagulants Follow-up Appointments o Return Appointment in 2 weeks. Bathing/ Shower/ Hygiene o May shower; gently cleanse wound with antibacterial soap, rinse and pat dry prior to dressing wounds Wound Treatment Wound #1 - Forearm Wound Laterality: Right, Medial Primary Dressing: Xeroform 5x9-HBD (in/in) (Generic) 1 x Per Day/30 Days Discharge Instructions: Apply Xeroform 5x9-HBD (in/in) as directed Secondary Dressing: Gauze 1 x Per Day/30 Days Discharge Instructions: As directed: dry, moistened with saline or moistened with Dakins Solution Secondary Dressing: Zetuvit Plus Silicone Border Dressing 4x4 (in/in) (Generic) 1 x Per Day/30 Days Electronic Signature(s) Signed: 01/10/2022 5:22:07 PM By: Worthy Keeler PA-C Signed: 01/11/2022 1:46:31 PM By: Carlene Coria RN Entered By: Carlene Coria on 01/10/2022 13:02:31 Geer, Kalea R. ([id number]) -------------------------------------------------------------------------------- Problem List Details Patient Name: Tamara Repress R. Date of Service: 01/10/2022 12:30 PM Medical Record Number: [id number] Patient Account Number:  [id number] Date of Birth/Sex: 03-16-1946 (76 y.o. F) Treating RN: Carlene Coria Primary Care Provider: Deborra Medina Other Clinician: Referring Provider: Deborra Medina Treating Provider/Extender: Skipper Cliche in Treatment: 1 Active Problems ICD-10 Encounter Code Description Active Date MDM Diagnosis [phone] Laceration without foreign body of right forearm, initial encounter 01/03/2022 No Yes J44.9 Chronic obstructive pulmonary disease, unspecified 01/03/2022 No Yes K50.90 Crohn's disease, unspecified, without complications 2/97/9892 No Yes Z79.01 Long term (current) use of anticoagulants 01/03/2022 No Yes Inactive Problems Resolved Problems Electronic Signature(s) Signed: 01/10/2022 12:59:18 PM By: Worthy Keeler PA-C Entered By: Worthy Keeler on 01/10/2022 12:59:18 Bugarin, Dhriti R. ([id number]) -------------------------------------------------------------------------------- Progress Note Details Patient Name: Tamara Repress R. Date of Service: 01/10/2022 12:30 PM Medical Record Number: [id number] Patient Account Number: [id number] Date of Birth/Sex: [birth year] (76 y.o. F) Treating RN: Carlene Coria Primary Care Provider: Deborra Medina Other Clinician: Referring Provider: Deborra Medina Treating Provider/Extender: Skipper Cliche in Treatment: 1 Subjective Chief Complaint Information obtained from Patient Right forearm laceration/skin tear History of Present Illness (HPI) 01/03/2022 upon evaluation today patient presents for initial evaluation here in the clinic concerning issues she had around mid January when her 2 little dogs were on her lap and got into a fight. She tried to get them off of her and subsequently through her sweatshirt she sustained a quite significant skin tear to the right forearm. She was seen in the ER where they did suture this as best they could but it was a fairly extensive suture repair and left an area that was still open to heal by second intent.  Dr. Derrel Nip has been managing this for her but finally got to the point where she felt like the wound center may be helpful in getting this to close finally. Fortunately I do not see any signs of active infection locally nor systemically at this time. The patient has had apparently a CVA in the past and is on anticoagulants actually not for that before an issue with a blood clot following a surgery on her knee. She also has Crohn's disease and COPD. Currently she is on Coumadin as an anticoagulant. 01/10/2022 upon evaluation today patient appears to be doing well with regard to her wounds. She has been tolerating the dressing changes  without complication. Fortunately there does not appear to be any evidence of active infection locally nor systemically at this point which is great news. No fevers, chills, nausea, vomiting, or diarrhea. Objective Constitutional Well-nourished and well-hydrated in no acute distress. Vitals Time Taken: 12:41 PM, Height: 61 in, Weight: 92 lbs, BMI: 17.4, Temperature: 98.4 F, Pulse: 109 bpm, Respiratory Rate: 18 breaths/min, Blood Pressure: 118/68 mmHg. Respiratory normal breathing without difficulty. Psychiatric this patient is able to make decisions and demonstrates good insight into disease process. Alert and Oriented x 3. pleasant and cooperative. General Notes: Upon inspection patient's wound bed actually showed signs of good granulation and epithelization at this point. Fortunately there does not appear to be any evidence of active infection locally nor systemically which is great news and overall very pleased with where things stand here currently. Integumentary (Hair, Skin) Wound #1 status is Open. Original cause of wound was Trauma. The date acquired was: 11/28/2021. The wound has been in treatment 1 weeks. The wound is located on the Right,Medial Forearm. The wound measures 1cm length x 0.5cm width x 0.1cm depth; 0.393cm^2 area and 0.039cm^3 volume. There is  Fat Layer (Subcutaneous Tissue) exposed. There is no tunneling or undermining noted. There is a medium amount of serosanguineous drainage noted. There is medium (34-66%) pink granulation within the wound bed. There is a medium (34-66%) amount of necrotic tissue within the wound bed including Adherent Slough. Assessment Active Problems ICD-10 Rubin, Cathe R. ([id number]) Laceration without foreign body of right forearm, initial encounter Chronic obstructive pulmonary disease, unspecified Crohn's disease, unspecified, without complications Long term (current) use of anticoagulants Plan Follow-up Appointments: Return Appointment in 2 weeks. Bathing/ Shower/ Hygiene: May shower; gently cleanse wound with antibacterial soap, rinse and pat dry prior to dressing wounds WOUND #1: - Forearm Wound Laterality: Right, Medial Primary Dressing: Xeroform 5x9-HBD (in/in) (Generic) 1 x Per Day/30 Days Discharge Instructions: Apply Xeroform 5x9-HBD (in/in) as directed Secondary Dressing: Gauze 1 x Per Day/30 Days Discharge Instructions: As directed: dry, moistened with saline or moistened with Dakins Solution Secondary Dressing: Zetuvit Plus Silicone Border Dressing 4x4 (in/in) (Generic) 1 x Per Day/30 Days 1. I would recommend currently that we go ahead and continue with the Xeroform gauze which I think is doing a great job. 2. I am also can recommend that we have the patient continue with the border foam dressing to cover which I think is doing excellent. We will actually be seeing her in 2 weeks next week I will not be here on Thursday it was good to be a nurse visit only. The patient states that she can change the dressing herself and therefore wanted to just hold and see me again in 2 weeks. Electronic Signature(s) Signed: 01/10/2022 1:19:25 PM By: Worthy Keeler PA-C Entered By: Worthy Keeler on 01/10/2022 13:19:25 Tilson, Gema R.  ([id number]) -------------------------------------------------------------------------------- SuperBill Details Patient Name: Tamara Repress R. Date of Service: 01/10/2022 Medical Record Number: [id number] Patient Account Number: [id number] Date of Birth/Sex: [birth year] (76 y.o. F) Treating RN: Carlene Coria Primary Care Provider: Deborra Medina Other Clinician: Referring Provider: Deborra Medina Treating Provider/Extender: Skipper Cliche in Treatment: 1 Diagnosis Coding ICD-10 Codes Code Description [phone] Laceration without foreign body of right forearm, initial encounter J44.9 Chronic obstructive pulmonary disease, unspecified K50.90 Crohn's disease, unspecified, without complications Z56.38 Long term (current) use of anticoagulants Facility Procedures CPT4 Code: [id number] Description: [phone] - WOUND CARE VISIT-LEV 2 EST PT Modifier: Quantity: 1 Physician Procedures CPT4 Code: 1660630 Description: 99214 - WC PHYS  LEVEL 4 - EST PT Modifier: Quantity: 1 CPT4 Code: Description: ICD-10 Diagnosis Description N79.728A Laceration without foreign body of right forearm, initial encounter J44.9 Chronic obstructive pulmonary disease, unspecified K50.90 Crohn's disease, unspecified, without complications S60.15 Long term  (current) use of anticoagulants Modifier: Quantity: Electronic Signature(s) Signed: 01/10/2022 1:19:39 PM By: Worthy Keeler PA-C Entered By: Worthy Keeler on 01/10/2022 13:19:39

## 2022-01-11 NOTE — Progress Notes (Signed)
Tamara [name] ([id number]) Visit Report for 01/10/2022 Arrival Information Details Patient Name: Tamara [name], Tamara [name]. Date of Service: 01/10/2022 12:30 PM Medical Record Number: [id number] Patient Account Number: [id number] Date of Birth/Sex: 04-Jan-1946 (76 y.o. F) Treating RN: Tamara [name] Primary Care Tamara [name]: Tamara [name] Other Clinician: Referring Tamara [name]: Tamara [name] Treating Tamara [name]/Extender: Tamara [name] in Treatment: 1 Visit Information History Since Last Visit All ordered tests and consults were completed: No Patient Arrived: Ambulatory Added or deleted any medications: No Arrival Time: 12:37 Any new allergies or adverse reactions: No Accompanied By: self Had a fall or experienced change in No Transfer Assistance: None activities of daily living that may affect Patient Identification Verified: Yes risk of falls: Secondary Verification Process Completed: Yes Signs or symptoms of abuse/neglect since last visito No Patient Requires Transmission-Based Precautions: No Hospitalized since last visit: No Patient Has Alerts: No Implantable device outside of the clinic excluding No cellular tissue based products placed in the center since last visit: Has Dressing in Place as Prescribed: Yes Pain Present Now: No Electronic Signature(s) Signed: 01/11/2022 1:46:31 PM By: Tamara Coria RN Entered By: Tamara [name] on 01/10/2022 12:41:09 [place], [place]. ([id number]) -------------------------------------------------------------------------------- Clinic Level of Care Assessment Details Patient Name: Tamara [name], Tamara [name]. Date of Service: 01/10/2022 12:30 PM Medical Record Number: [id number] Patient Account Number: [id number] Date of Birth/Sex: 1945/12/22 (76 y.o. F) Treating RN: Tamara [name] Primary Care Tamara [name]: Tamara [name] Other Clinician: Referring Tamara [name]: Tamara [name] Treating Tamara [name]/Extender: Tamara [name] in Treatment: 1 Clinic Level of Care Assessment  Items TOOL 4 Quantity Score X - Use when only an EandM is performed on FOLLOW-UP visit 1 0 ASSESSMENTS - Nursing Assessment / Reassessment X - Reassessment of Co-morbidities (includes updates in patient status) 1 10 X- 1 5 Reassessment of Adherence to Treatment Plan ASSESSMENTS - Wound and Skin Assessment / Reassessment X - Simple Wound Assessment / Reassessment - one wound 1 5 []  - 0 Complex Wound Assessment / Reassessment - multiple wounds []  - 0 Dermatologic / Skin Assessment (not related to wound area) ASSESSMENTS - Focused Assessment []  - Circumferential Edema Measurements - multi extremities 0 []  - 0 Nutritional Assessment / Counseling / Intervention []  - 0 Lower Extremity Assessment (monofilament, tuning fork, pulses) []  - 0 Peripheral Arterial Disease Assessment (using hand held doppler) ASSESSMENTS - Ostomy and/or Continence Assessment and Care []  - Incontinence Assessment and Management 0 []  - 0 Ostomy Care Assessment and Management (repouching, etc.) PROCESS - Coordination of Care X - Simple Patient / Family Education for ongoing care 1 15 []  - 0 Complex (extensive) Patient / Family Education for ongoing care []  - 0 Staff obtains Programmer, systems, Records, Test Results / Process Orders []  - 0 Staff telephones HHA, Nursing Homes / Clarify orders / etc []  - 0 Routine Transfer to another Facility (non-emergent condition) []  - 0 Routine Hospital Admission (non-emergent condition) []  - 0 New Admissions / Biomedical engineer / Ordering NPWT, Apligraf, etc. []  - 0 Emergency Hospital Admission (emergent condition) X- 1 10 Simple Discharge Coordination []  - 0 Complex (extensive) Discharge Coordination PROCESS - Special Needs []  - Pediatric / Minor Patient Management 0 []  - 0 Isolation Patient Management []  - 0 Hearing / Language / Visual special needs []  - 0 Assessment of Community assistance (transportation, D/C planning, etc.) []  - 0 Additional assistance /  Altered mentation []  - 0 Support Surface(s) Assessment (bed, cushion, seat, etc.) INTERVENTIONS - Wound Cleansing / Measurement Tamara [name]. ([id number]) X- 1 5 Simple Wound Cleansing -  one wound []  - 0 Complex Wound Cleansing - multiple wounds X- 1 5 Wound Imaging (photographs - any number of wounds) []  - 0 Wound Tracing (instead of photographs) X- 1 5 Simple Wound Measurement - one wound []  - 0 Complex Wound Measurement - multiple wounds INTERVENTIONS - Wound Dressings X - Small Wound Dressing one or multiple wounds 1 10 []  - 0 Medium Wound Dressing one or multiple wounds []  - 0 Large Wound Dressing one or multiple wounds []  - 0 Application of Medications - topical []  - 0 Application of Medications - injection INTERVENTIONS - Miscellaneous []  - External ear exam 0 []  - 0 Specimen Collection (cultures, biopsies, blood, body fluids, etc.) []  - 0 Specimen(s) / Culture(s) sent or taken to Lab for analysis []  - 0 Patient Transfer (multiple staff / Civil Service fast streamer / Similar devices) []  - 0 Simple Staple / Suture removal (25 or less) []  - 0 Complex Staple / Suture removal (26 or more) []  - 0 Hypo / Hyperglycemic Management (close monitor of Blood Glucose) []  - 0 Ankle / Brachial Index (ABI) - do not check if billed separately X- 1 5 Vital Signs Has the patient been seen at the hospital within the last three years: Yes Total Score: 75 Level Of Care: New/Established - Level 2 Electronic Signature(s) Signed: 01/11/2022 1:46:31 PM By: Tamara Coria RN Entered By: Tamara [name] on 01/10/2022 13:06:15 Tamara [name]. ([id number]) -------------------------------------------------------------------------------- Encounter Discharge Information Details Patient Name: Tamara [name]. Date of Service: 01/10/2022 12:30 PM Medical Record Number: [id number] Patient Account Number: [id number] Date of Birth/Sex: February 09, 1946 (76 y.o. F) Treating RN: Tamara [name] Primary Care  Tamara [name]: Tamara [name] Other Clinician: Referring Jakub Debold: Tamara [name] Treating Ionna Avis/Extender: Tamara [name] in Treatment: 1 Encounter Discharge Information Items Discharge Condition: Stable Ambulatory Status: Ambulatory Discharge Destination: Home Transportation: Private Auto Accompanied By: self Schedule Follow-up Appointment: Yes Clinical Summary of Care: Patient Declined Electronic Signature(s) Signed: 01/11/2022 1:46:31 PM By: Tamara Coria RN Entered By: Tamara [name] on 01/10/2022 [place], [place]. ([id number]) -------------------------------------------------------------------------------- Lower Extremity Assessment Details Patient Name: Tamara [name], Tamara [name]. Date of Service: 01/10/2022 12:30 PM Medical Record Number: [id number] Patient Account Number: [id number] Date of Birth/Sex: Jan 08, 1946 (76 y.o. F) Treating RN: Tamara [name] Primary Care Elya Tarquinio: Tamara [name] Other Clinician: Referring Maurita Havener: Tamara [name] Treating Maston Wight/Extender: Jeri Cos Weeks in Treatment: 1 Electronic Signature(s) Signed: 01/11/2022 1:46:31 PM By: Tamara Coria RN Entered By: Tamara [name] on 01/10/2022 12:45:29 [place], Jaonna [name]. ([id number]) -------------------------------------------------------------------------------- Multi Wound Chart Details Patient Name: Tamara [name]. Date of Service: 01/10/2022 12:30 PM Medical Record Number: [id number] Patient Account Number: [id number] Date of Birth/Sex: [birth year] (76 y.o. F) Treating RN: Tamara [name] Primary Care Eastyn Skalla: Tamara [name] Other Clinician: Referring Ulices Maack: Tamara [name] Treating Avrohom Mckelvin/Extender: Tamara [name] in Treatment: 1 Vital Signs Height(in): 61 Pulse(bpm): 109 Weight(lbs): 101 Blood Pressure(mmHg): 118/68 Body Mass Index(BMI): 17.4 Temperature(F): 98.4 Respiratory Rate(breaths/min): 18 Photos: [N/A:N/A] Wound Location: Right, Medial Forearm N/A N/A Wounding Event: Trauma N/A  N/A Primary Etiology: Skin Tear N/A N/A Comorbid History: Chronic Obstructive Pulmonary N/A N/A Disease (COPD), Deep Vein Thrombosis Date Acquired: 11/28/2021 N/A N/A Weeks of Treatment: 1 N/A N/A Wound Status: Open N/A N/A Wound Recurrence: No N/A N/A Measurements L x W x D (cm) 1x0.5x0.1 N/A N/A Area (cm) : 0.393 N/A N/A Volume (cm) : 0.039 N/A N/A % Reduction in Area: 93.70% N/A N/A % Reduction in Volume: 93.80% N/A N/A Classification: Full Thickness Without Exposed N/A N/A Support Structures Exudate Amount: Medium  N/A N/A Exudate Type: Serosanguineous N/A N/A Exudate Color: red, brown N/A N/A Granulation Amount: Medium (34-66%) N/A N/A Granulation Quality: Pink N/A N/A Necrotic Amount: Medium (34-66%) N/A N/A Exposed Structures: Fat Layer (Subcutaneous Tissue): N/A N/A Yes Fascia: No Tendon: No Muscle: No Joint: No Bone: No Epithelialization: None N/A N/A Treatment Notes Electronic Signature(s) Signed: 01/11/2022 1:46:31 PM By: Tamara Coria RN Entered By: Tamara [name] on 01/10/2022 12:45:56 Tamara [name], Tamara RMarland Kitchen ([id number]) -------------------------------------------------------------------------------- [place] Details Patient Name: Tamara [name]. Date of Service: 01/10/2022 12:30 PM Medical Record Number: [id number] Patient Account Number: [id number] Date of Birth/Sex: 1946-01-31 (76 y.o. F) Treating RN: Tamara [name] Primary Care Apolonio Cutting: Tamara [name] Other Clinician: Referring Atasha Colebank: Tamara [name] Treating Marye Eagen/Extender: Tamara [name] in Treatment: 1 Active Inactive Wound/Skin Impairment Nursing Diagnoses: Knowledge deficit related to ulceration/compromised skin integrity Goals: Patient/caregiver will verbalize understanding of skin care regimen Date Initiated: 01/03/2022 Target Resolution Date: 01/31/2022 Goal Status: Active Ulcer/skin breakdown will have a volume reduction of 30% by week 4 Date Initiated: 01/03/2022 Target  Resolution Date: 01/31/2022 Goal Status: Active Ulcer/skin breakdown will have a volume reduction of 50% by week 8 Date Initiated: 01/03/2022 Target Resolution Date: 03/03/2022 Goal Status: Active Ulcer/skin breakdown will have a volume reduction of 80% by week 12 Date Initiated: 01/03/2022 Target Resolution Date: 04/02/2022 Goal Status: Active Ulcer/skin breakdown will heal within 14 weeks Date Initiated: 01/03/2022 Target Resolution Date: 05/03/2022 Goal Status: Active Interventions: Assess patient/caregiver ability to obtain necessary supplies Assess patient/caregiver ability to perform ulcer/skin care regimen upon admission and as needed Assess ulceration(s) every visit Notes: Electronic Signature(s) Signed: 01/11/2022 1:46:31 PM By: Tamara Coria RN Entered By: Tamara [name] on 01/10/2022 12:45:43 Tamara [name], [place]. ([id number]) -------------------------------------------------------------------------------- Pain Assessment Details Patient Name: Tamara [name]. Date of Service: 01/10/2022 12:30 PM Medical Record Number: [id number] Patient Account Number: [id number] Date of Birth/Sex: 1946/10/17 (76 y.o. F) Treating RN: Tamara [name] Primary Care [place] Greb: Tamara [name] Other Clinician: Referring Daune Divirgilio: Tamara [name] Treating Kie Calvin/Extender: Tamara [name] in Treatment: 1 Active Problems Location of Pain Severity and Description of Pain Patient Has Paino No Site Locations Pain Management and Medication Current Pain Management: Electronic Signature(s) Signed: 01/11/2022 1:46:31 PM By: Tamara Coria RN Entered By: Tamara [name] on 01/10/2022 12:41:44 Tamara [name], Tamara RMarland Kitchen ([id number]) -------------------------------------------------------------------------------- Patient/Caregiver Education Details Patient Name: Tamara Nam. Date of Service: 01/10/2022 12:30 PM Medical Record Number: [id number] Patient Account Number: [id number] Date of Birth/Gender: 08/14/1946 (76 y.o.  F) Treating RN: Tamara [name] Primary Care Physician: Tamara [name] Other Clinician: Referring Physician: Deborra [name] Treating Physician/Extender: Tamara [name] in Treatment: 1 Education Assessment Education Provided To: Patient Education Topics Provided Wound/Skin Impairment: Methods: Explain/Verbal Responses: State content correctly Electronic Signature(s) Signed: 01/11/2022 1:46:31 PM By: Tamara Coria RN Entered By: Tamara [name] on 01/10/2022 13:06:34 [place], [place]. ([id number]) -------------------------------------------------------------------------------- Wound Assessment Details Patient Name: Tamara [name], Tamara [name]. Date of Service: 01/10/2022 12:30 PM Medical Record Number: [id number] Patient Account Number: [id number] Date of Birth/Sex: 04/29/1946 (76 y.o. F) Treating RN: Tamara [name] Primary Care Kynan Peasley: Tamara [name] Other Clinician: Referring Reilly Blades: Tamara [name] Treating Devita Nies/Extender: Tamara [name] in Treatment: 1 Wound Status Wound Number: 1 Primary Skin Tear Etiology: Wound Location: Right, Medial Forearm Wound Status: Open Wounding Event: Trauma Comorbid Chronic Obstructive Pulmonary Disease (COPD), Deep Date Acquired: 11/28/2021 History: Vein Thrombosis Weeks Of Treatment: 1 Clustered Wound: No Photos Wound Measurements Length: (cm) 1 Width: (cm) 0.5 Depth: (cm) 0.1 Area: (cm) 0.393 Volume: (cm) 0.039 % Reduction in Area: 93.7% % Reduction  in Volume: 93.8% Epithelialization: None Tunneling: No Undermining: No Wound Description Classification: Full Thickness Without Exposed Support Structu Exudate Amount: Medium Exudate Type: Serosanguineous Exudate Color: red, brown res Foul Odor After Cleansing: No Slough/Fibrino Yes Wound Bed Granulation Amount: Medium (34-66%) Exposed Structure Granulation Quality: Pink Fascia Exposed: No Necrotic Amount: Medium (34-66%) Fat Layer (Subcutaneous Tissue) Exposed: Yes Necrotic Quality:  Adherent Slough Tendon Exposed: No Muscle Exposed: No Joint Exposed: No Bone Exposed: No Treatment Notes Wound #1 (Forearm) Wound Laterality: Right, Medial Cleanser Peri-Wound Care Topical Primary Dressing Tamara [name], Tamara [name]. ([id number]) Xeroform 5x9-HBD (in/in) Discharge Instruction: Apply Xeroform 5x9-HBD (in/in) as directed Secondary Dressing Gauze Discharge Instruction: As directed: dry, moistened with saline or moistened with Dakins Solution Zetuvit Plus Silicone Border Dressing 4x4 (in/in) Secured With Compression Wrap Compression Stockings Add-Ons Electronic Signature(s) Signed: 01/11/2022 1:46:31 PM By: Tamara Coria RN Entered By: Tamara [name] on 01/10/2022 12:45:07 Tamara [name], Tamara [name]. ([id number]) -------------------------------------------------------------------------------- Vitals Details Patient Name: Tamara [name]. Date of Service: 01/10/2022 12:30 PM Medical Record Number: [id number] Patient Account Number: [id number] Date of Birth/Sex: Feb 26, 1946 (76 y.o. F) Treating RN: Tamara [name] Primary Care [place]: Tamara [name] Other Clinician: Referring Annel Zunker: Tamara [name] Treating Srija Southard/Extender: Tamara [name] in Treatment: 1 Vital Signs Time Taken: 12:41 Temperature (F): 98.4 Height (in): 61 Pulse (bpm): 109 Weight (lbs): 92 Respiratory Rate (breaths/min): 18 Body Mass Index (BMI): 17.4 Blood Pressure (mmHg): 118/68 Reference Range: 80 - 120 mg / dl Electronic Signature(s) Signed: 01/11/2022 1:46:31 PM By: Tamara Coria RN Entered By: Tamara [name] on 01/10/2022 12:41:34

## 2022-01-17 ENCOUNTER — Ambulatory Visit: Payer: Medicare Other

## 2022-01-22 ENCOUNTER — Other Ambulatory Visit (HOSPITAL_COMMUNITY): Payer: Self-pay | Admitting: Neurosurgery

## 2022-01-22 ENCOUNTER — Other Ambulatory Visit: Payer: Self-pay | Admitting: Neurosurgery

## 2022-01-22 DIAGNOSIS — G8929 Other chronic pain: Secondary | ICD-10-CM

## 2022-01-22 DIAGNOSIS — M5416 Radiculopathy, lumbar region: Secondary | ICD-10-CM

## 2022-01-22 DIAGNOSIS — M4807 Spinal stenosis, lumbosacral region: Secondary | ICD-10-CM | POA: Diagnosis not present

## 2022-01-22 DIAGNOSIS — M5441 Lumbago with sciatica, right side: Secondary | ICD-10-CM | POA: Diagnosis not present

## 2022-01-22 DIAGNOSIS — M5442 Lumbago with sciatica, left side: Secondary | ICD-10-CM | POA: Diagnosis not present

## 2022-01-22 DIAGNOSIS — M47816 Spondylosis without myelopathy or radiculopathy, lumbar region: Secondary | ICD-10-CM | POA: Diagnosis not present

## 2022-01-23 IMAGING — CT CT CHEST LUNG CANCER SCREENING LOW DOSE W/O CM
2 of 5 series · 15 of 40 positions shown, 18 images · non-contrast
Comparison: 01/26/2020 chest radiograph. Chest CT 04/16/2007,
diagnostic.

CLINICAL DATA: Fifty pack-year smoking history. Asymptomatic
current smoker.

EXAM:
CT CHEST WITHOUT CONTRAST LOW-DOSE FOR LUNG CANCER SCREENING
TECHNIQUE: Multidetector CT imaging of the chest was performed following the
standard protocol without IV contrast.

[Series 3: lung 1.00 · axial · 0.56mm/px · z∈[-1266,-933]mm · 12 of 367 slices shown, 15 images]
[im 17/367  mediastinal]
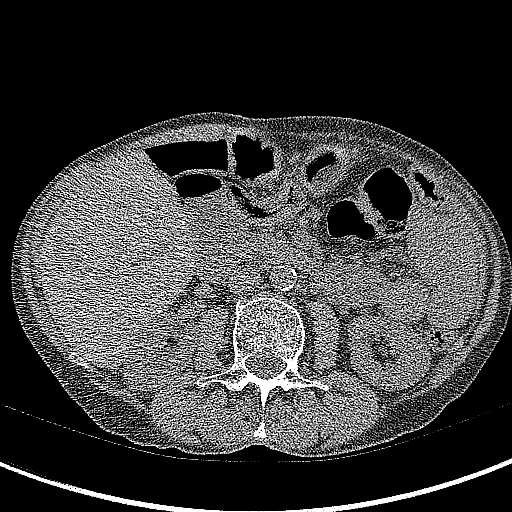
[im 17/367  lung]
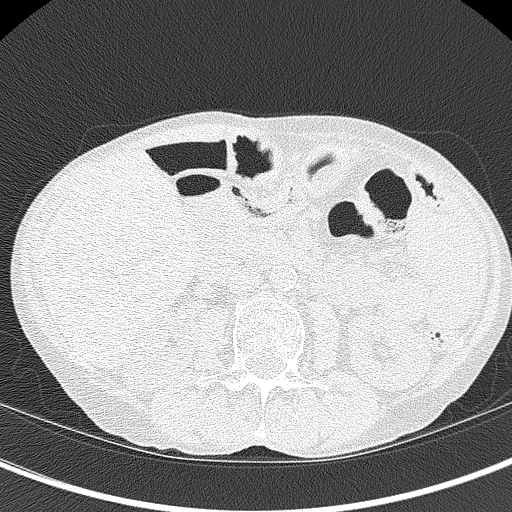
[im 50/367  lung]
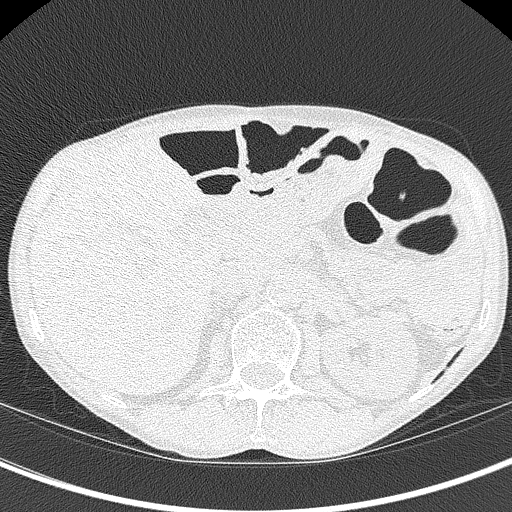
[im 84/367  lung]
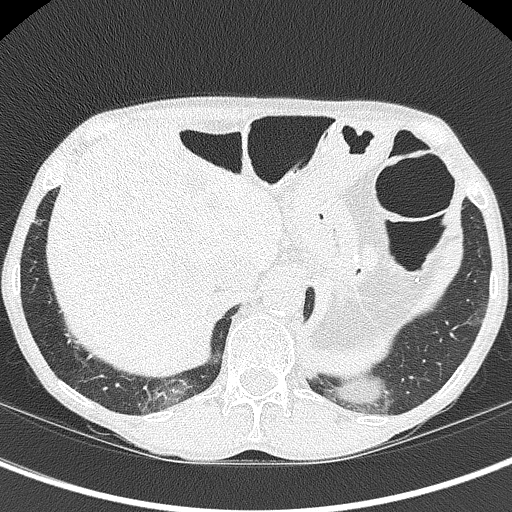
[im 117/367  lung]
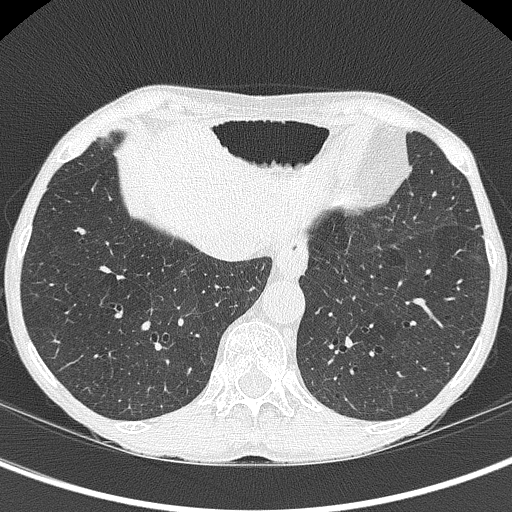
[im 134/367  mediastinal]
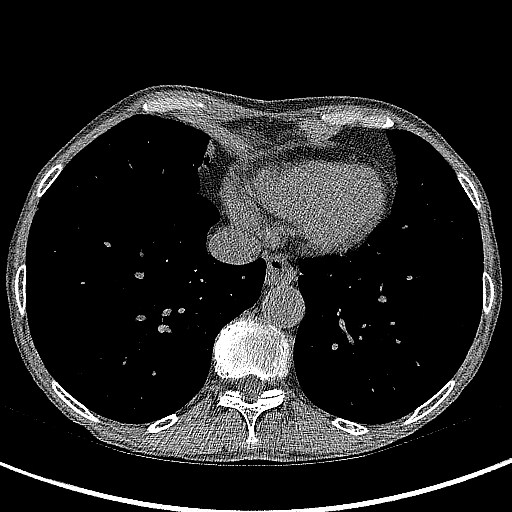
[im 134/367  lung]
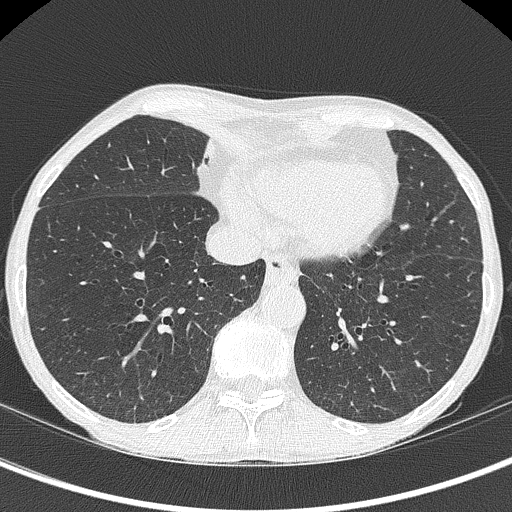
[im 167/367  lung]
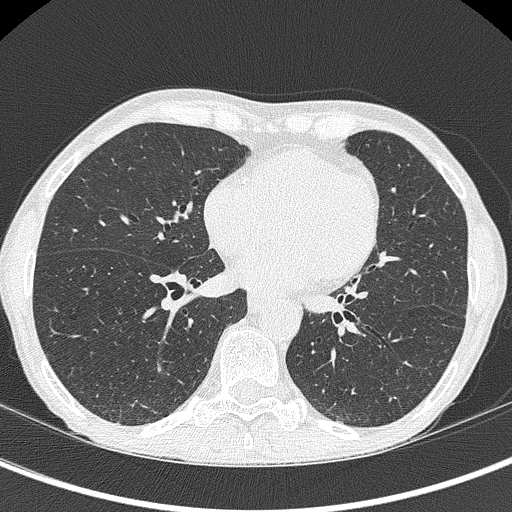
[im 200/367  lung]
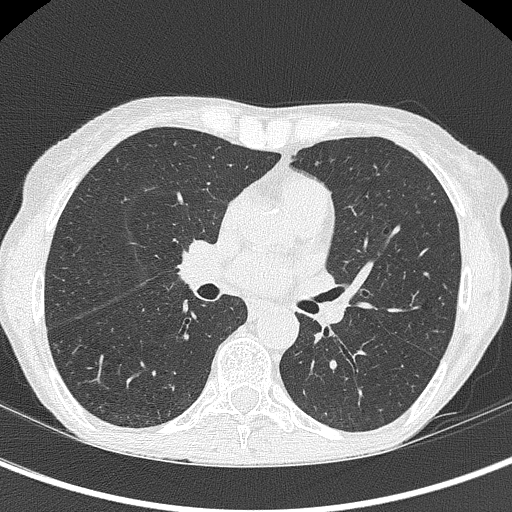
[im 233/367  lung]
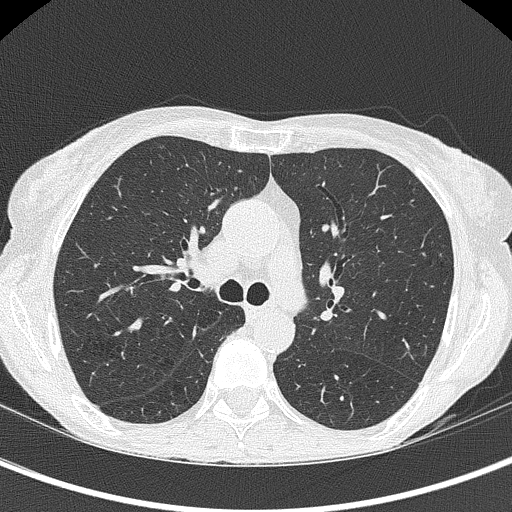
[im 250/367  mediastinal]
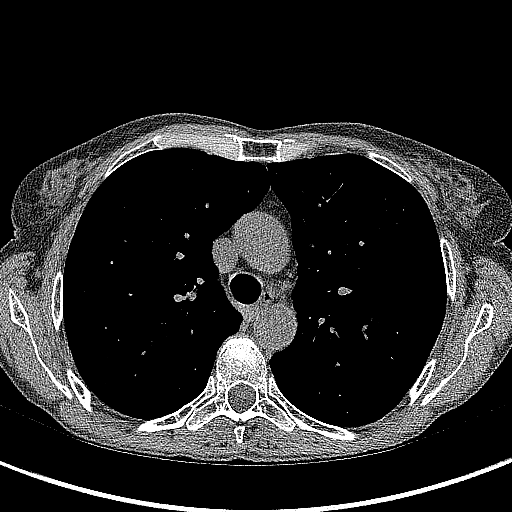
[im 250/367  lung]
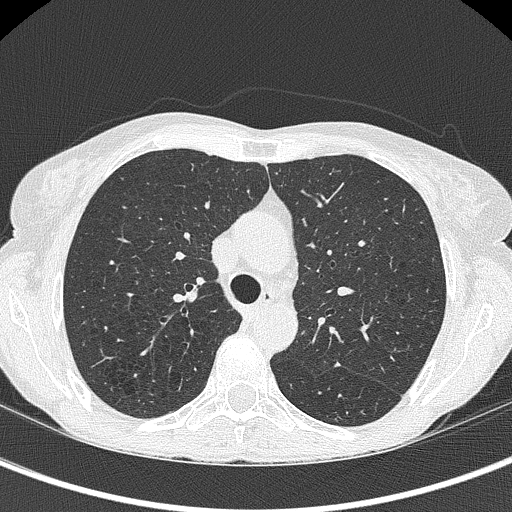
[im 283/367  lung]
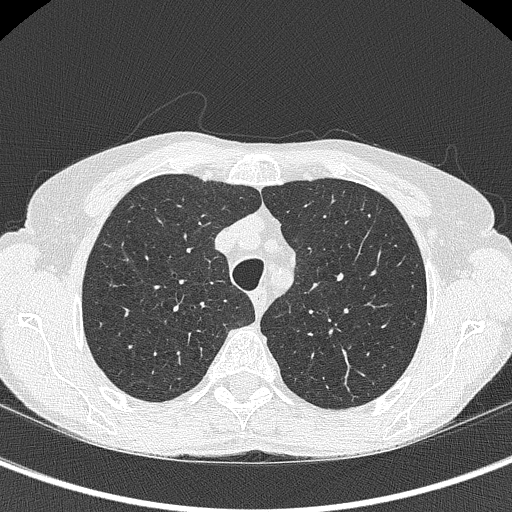
[im 317/367  lung]
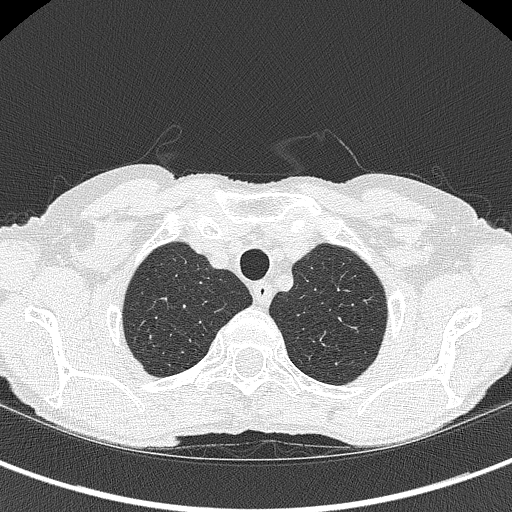
[im 350/367  lung]
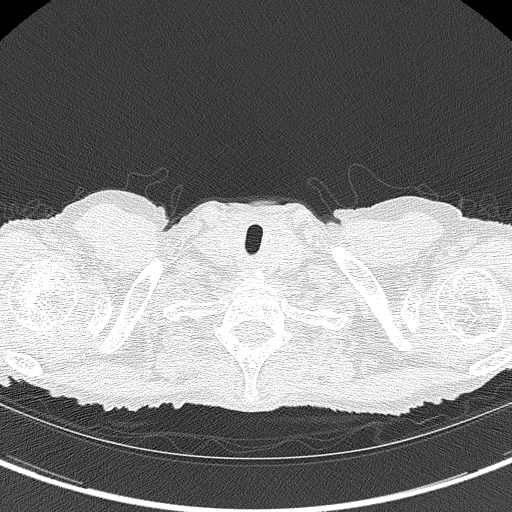

[Series 4: coronals lung 1.00 cor · coronal · 0.56mm/px · 3 of 215 slices shown]
[im 43/215  lung]
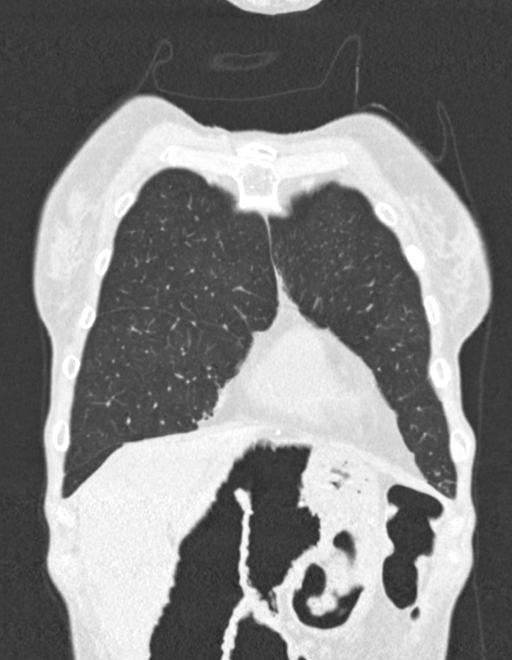
[im 86/215  lung]
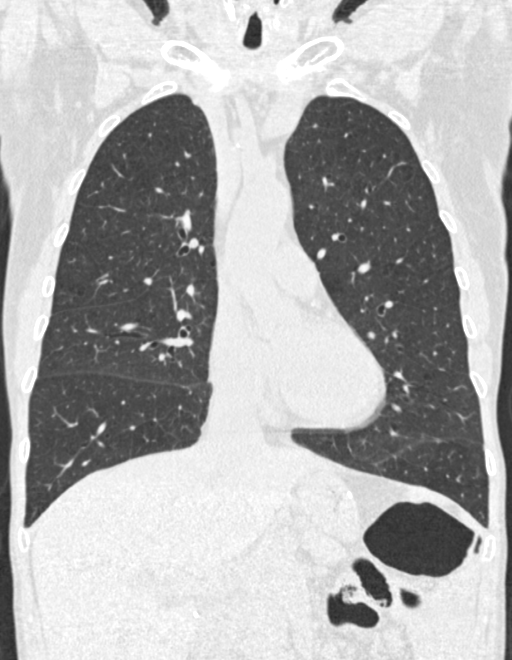
[im 129/215  lung]
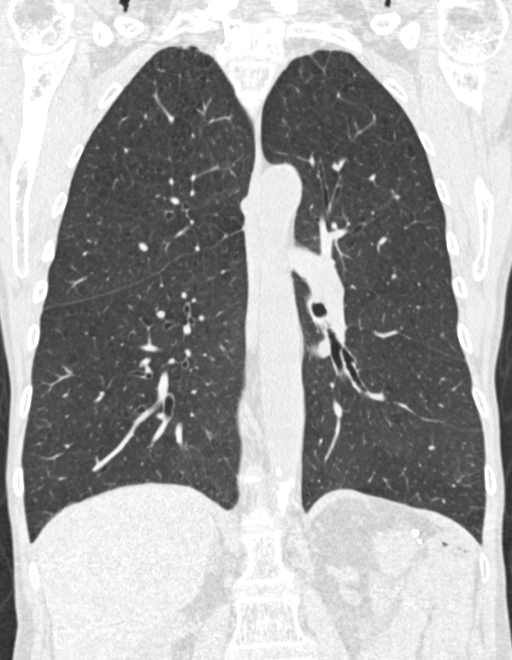

[15 of 40 positions shown; findings below may reference images not displayed]

FINDINGS: Cardiovascular: Aortic atherosclerosis. Normal heart size, without
pericardial effusion. Right coronary artery calcification.

Mediastinum/Nodes: No mediastinal or definite hilar adenopathy,
given limitations of unenhanced CT.

Lungs/Pleura: No pleural fluid. Mild centrilobular emphysema.
Bilateral pulmonary nodules of maximally volume derived equivalent
diameter 3.9 mm in the subpleural right lower lobe. A density within
the right middle lobe is likely an area of mucoid impaction at
volume derived equivalent diameter 5.9 mm.

Upper Abdomen: Cholecystectomy. Splenectomy. Surgical changes about
the gastric body. Normal imaged portions of the pancreas, adrenal
glands, kidneys.

Musculoskeletal: Osteopenia.  Cervical spine fixation.
IMPRESSION: 1. Lung-RADS 2, benign appearance or behavior. Continue annual
screening with low-dose chest CT without contrast in 12 months.
2. Aortic Atherosclerosis (O5TFV-9FJ.J) and Emphysema (O5TFV-QVF.B).
Coronary artery atherosclerosis.

## 2022-01-24 ENCOUNTER — Other Ambulatory Visit: Payer: Self-pay

## 2022-01-24 ENCOUNTER — Encounter: Payer: Medicare Other | Admitting: Physician Assistant

## 2022-01-24 DIAGNOSIS — S51811A Laceration without foreign body of right forearm, initial encounter: Secondary | ICD-10-CM | POA: Diagnosis not present

## 2022-01-24 DIAGNOSIS — L98492 Non-pressure chronic ulcer of skin of other sites with fat layer exposed: Secondary | ICD-10-CM | POA: Diagnosis not present

## 2022-01-24 DIAGNOSIS — Z7901 Long term (current) use of anticoagulants: Secondary | ICD-10-CM | POA: Diagnosis not present

## 2022-01-24 DIAGNOSIS — J449 Chronic obstructive pulmonary disease, unspecified: Secondary | ICD-10-CM | POA: Diagnosis not present

## 2022-01-24 DIAGNOSIS — K5090 Crohn's disease, unspecified, without complications: Secondary | ICD-10-CM | POA: Diagnosis not present

## 2022-01-24 NOTE — Progress Notes (Addendum)
SHON, MANSOURI R. ([id number]) ?Visit Report for 01/24/2022 ?Chief Complaint Document Details ?Patient Name: Tamara [name] ?Date of Service: 01/24/2022 8:00 AM ?Medical Record Number: [id number] ?Patient Account Number: [id number] ?Date of Birth/Sex: Oct 23, 1946 (76 y.o. F) ?Treating RN: Carlene Coria ?Primary Care Provider: Deborra Medina Other Clinician: ?Referring Provider: Deborra Medina ?Treating Provider/Extender: Jeri Cos ?Weeks in Treatment: 3 ?Information Obtained from: Patient ?Chief Complaint ?Right forearm laceration/skin tear ?Electronic Signature(s) ?Signed: 01/24/2022 8:21:07 AM By: Worthy Keeler PA-C ?Entered By: Worthy Keeler on 01/24/2022 08:21:07 ?[name], [name] ([id number]) ?-------------------------------------------------------------------------------- ?HPI Details ?Patient Name: Tamara, [name] ?Date of Service: 01/24/2022 8:00 AM ?Medical Record Number: [id number] ?Patient Account Number: [id number] ?Date of Birth/Sex: 02/02/1946 (76 y.o. F) ?Treating RN: Carlene Coria ?Primary Care Provider: Deborra Medina Other Clinician: ?Referring Provider: Deborra Medina ?Treating Provider/Extender: Jeri Cos ?Weeks in Treatment: 3 ?History of Present Illness ?HPI Description: 01/03/2022 upon evaluation today patient presents for initial evaluation here in the clinic concerning issues she had around mid ?January when her 2 little dogs were on her lap and got into a fight. She tried to get them off of her and subsequently through her sweatshirt she ?sustained a quite significant skin tear to the right forearm. She was seen in the ER where they did suture this as best they could but it was a fairly ?extensive suture repair and left an area that was still open to heal by second intent. Dr. Derrel Nip has been managing this for her but finally got to the ?point where she felt like the wound center may be helpful in getting this to close finally. Fortunately I do not see any signs of active infection ?locally  nor systemically at this time. ?The patient has had apparently a CVA in the past and is on anticoagulants actually not for that before an issue with a blood clot following a ?surgery on her knee. She also has Crohn's disease and COPD. Currently she is on Coumadin as an anticoagulant. ?01/10/2022 upon evaluation today patient appears to be doing well with regard to her wounds. She has been tolerating the dressing changes ?without complication. Fortunately there does not appear to be any evidence of active infection locally nor systemically at this point which is great ?news. No fevers, chills, nausea, vomiting, or diarrhea. ?01/24/2022 upon evaluation today patient's arm is actually very close to complete resolution. She seems to be doing excellent there is a lot of new ?skin hearing though its not completely closed and it is very close to being such. I do not see any evidence of active infection locally or systemically ?which is great news she has been using Xeroform and a border foam dressing. ?Electronic Signature(s) ?Signed: 01/24/2022 10:32:41 AM By: Worthy Keeler PA-C ?Entered By: Worthy Keeler on 01/24/2022 10:32:41 ?[name], [name] ([id number]) ?-------------------------------------------------------------------------------- ?Physical Exam Details ?Patient Name: Tamara, [name] ?Date of Service: 01/24/2022 8:00 AM ?Medical Record Number: [id number] ?Patient Account Number: [id number] ?Date of Birth/Sex: November 07, 1946 (76 y.o. F) ?Treating RN: Carlene Coria ?Primary Care Provider: Deborra Medina Other Clinician: ?Referring Provider: Deborra Medina ?Treating Provider/Extender: Jeri Cos ?Weeks in Treatment: 3 ?Constitutional ?Well-nourished and well-hydrated in no acute distress. ?Respiratory ?normal breathing without difficulty. ?Psychiatric ?this patient is able to make decisions and demonstrates good insight into disease process. Alert and Oriented x 3. pleasant and cooperative. ?Notes ?I do not see any  evidence of active infection locally or systemically at this time which is great news. No fevers, chills, nausea, vomiting, or ?diarrhea. I think  that she is very close to complete resolution in fact there is just a very tiny area this evening draining upon inspection patient's ?wound bed actually showed signs of good granulation and epithelization at this point. ?Electronic Signature(s) ?Signed: 01/24/2022 10:33:08 AM By: Worthy Keeler PA-C ?Entered By: Worthy Keeler on 01/24/2022 10:33:08 ?[name], [name] ([id number]) ?-------------------------------------------------------------------------------- ?Physician Orders Details ?Patient Name: Tamara, [name] ?Date of Service: 01/24/2022 8:00 AM ?Medical Record Number: [id number] ?Patient Account Number: [id number] ?Date of Birth/Sex: [birth year] (76 y.o. F) ?Treating RN: Carlene Coria ?Primary Care Provider: Deborra Medina Other Clinician: ?Referring Provider: Deborra Medina ?Treating Provider/Extender: Jeri Cos ?Weeks in Treatment: 3 ?Verbal / Phone Orders: No ?Diagnosis Coding ?ICD-10 Coding ?Code Description ?Q03.474Q Laceration without foreign body of right forearm, initial encounter ?J44.9 Chronic obstructive pulmonary disease, unspecified ?K50.90 Crohn's disease, unspecified, without complications ?Z79.01 Long term (current) use of anticoagulants ?Follow-up Appointments ?o Return Appointment in 1 week. ?Bathing/ Shower/ Hygiene ?o May shower; gently cleanse wound with antibacterial soap, rinse and pat dry prior to dressing wounds ?Wound Treatment ?Wound #1 - Forearm Wound Laterality: Right, Medial ?Primary Dressing: Xeroform 5x9-HBD (in/in) (Generic) 1 x Per Day/30 Days ?Discharge Instructions: Apply Xeroform 5x9-HBD (in/in) as directed ?Secondary Dressing: Gauze 1 x Per Day/30 Days ?Discharge Instructions: As directed: dry, moistened with saline or moistened with Dakins Solution ?Secondary Dressing: (SILICONE BORDER) Zetuvit Plus SILICONE BORDER  Dressing 4x4 (in/in) (Generic) 1 x Per Day/30 Days ?Electronic Signature(s) ?Signed: 01/25/2022 4:20:50 PM By: Carlene Coria RN ?Signed: 01/25/2022 5:45:40 PM By: Worthy Keeler PA-C ?Entered By: Carlene Coria on 01/24/2022 08:24:40 ?[name], [name] ([id number]) ?-------------------------------------------------------------------------------- ?Problem List Details ?Patient Name: Tamara, KHAIMOV ?Date of Service: 01/24/2022 8:00 AM ?Medical Record Number: [id number] ?Patient Account Number: [id number] ?Date of Birth/Sex: [birth year] (76 y.o. F) ?Treating RN: Carlene Coria ?Primary Care Provider: Deborra Medina Other Clinician: ?Referring Provider: Deborra Medina ?Treating Provider/Extender: Jeri Cos ?Weeks in Treatment: 3 ?Active Problems ?ICD-10 ?Encounter ?Code Description Active Date MDM ?Diagnosis ?C16.606T Laceration without foreign body of right forearm, initial encounter 01/03/2022 No Yes ?J44.9 Chronic obstructive pulmonary disease, unspecified 01/03/2022 No Yes ?K50.90 Crohn's disease, unspecified, without complications 0/16/0109 No Yes ?Z79.01 Long term (current) use of anticoagulants 01/03/2022 No Yes ?Inactive Problems ?Resolved Problems ?Electronic Signature(s) ?Signed: 01/24/2022 8:21:03 AM By: Worthy Keeler PA-C ?Entered By: Worthy Keeler on 01/24/2022 08:21:02 ?[name], [name] ([id number]) ?-------------------------------------------------------------------------------- ?Progress Note Details ?Patient Name: Tamara, [name] ?Date of Service: 01/24/2022 8:00 AM ?Medical Record Number: [id number] ?Patient Account Number: [id number] ?Date of Birth/Sex: 10-May-1946 (76 y.o. F) ?Treating RN: Carlene Coria ?Primary Care Provider: Deborra Medina Other Clinician: ?Referring Provider: Deborra Medina ?Treating Provider/Extender: Jeri Cos ?Weeks in Treatment: 3 ?Subjective ?Chief Complaint ?Information obtained from Patient ?Right forearm laceration/skin tear ?History of Present Illness (HPI) ?01/03/2022 upon  evaluation today patient presents for initial evaluation here in the clinic concerning issues she had around mid January when her ?2 little dogs were on her lap and got into a fight. She tried to get them off of her an

## 2022-01-25 NOTE — Progress Notes (Signed)
DA, MICHELLE R. ([id number]) ?Visit Report for 01/24/2022 ?Arrival Information Details ?Patient Name: Tamara [name], Tamara [name] ?Date of Service: 01/24/2022 8:00 AM ?Medical Record Number: [id number] ?Patient Account Number: [id number] ?Date of Birth/Sex: [birth year] (76 y.o. F) ?Treating RN: Carlene Coria ?Primary Care Chetara Kropp: Deborra Medina Other Clinician: ?Referring Samiya Mervin: Deborra Medina ?Treating Oceana Walthall/Extender: Jeri Cos ?Weeks in Treatment: 3 ?Visit Information History Since Last Visit ?All ordered tests and consults were completed: No ?Patient Arrived: Ambulatory ?Added or deleted any medications: No ?Arrival Time: 08:06 ?Any new allergies or adverse reactions: No ?Accompanied By: self ?Had a fall or experienced change in No ?Transfer Assistance: None ?activities of daily living that may affect ?Patient Identification Verified: Yes ?risk of falls: ?Secondary Verification Process Completed: Yes ?Signs or symptoms of abuse/neglect since last visito No ?Patient Requires Transmission-Based Precautions: No ?Hospitalized since last visit: No ?Patient Has Alerts: No ?Implantable device outside of the clinic excluding No ?cellular tissue based products placed in the center ?since last visit: ?Has Dressing in Place as Prescribed: Yes ?Pain Present Now: No ?Electronic Signature(s) ?Signed: 01/25/2022 4:20:50 PM By: Carlene Coria RN ?Entered By: Carlene Coria on 01/24/2022 08:09:33 ?Mccuen, Zuriel R. ([id number]) ?-------------------------------------------------------------------------------- ?Clinic Level of Care Assessment Details ?Patient Name: Tamara [name], Tamara [name] ?Date of Service: 01/24/2022 8:00 AM ?Medical Record Number: [id number] ?Patient Account Number: [id number] ?Date of Birth/Sex: [birth year] (76 y.o. F) ?Treating RN: Carlene Coria ?Primary Care Alva Kuenzel: Deborra Medina Other Clinician: ?Referring Dorothee Napierkowski: Deborra Medina ?Treating Sweta Halseth/Extender: Jeri Cos ?Weeks in Treatment: 3 ?Clinic Level of Care Assessment  Items ?TOOL 4 Quantity Score ?X - Use when only an EandM is performed on FOLLOW-UP visit 1 0 ?ASSESSMENTS - Nursing Assessment / Reassessment ?X - Reassessment of Co-morbidities (includes updates in patient status) 1 10 ?X- 1 5 ?Reassessment of Adherence to Treatment Plan ?ASSESSMENTS - Wound and Skin Assessment / Reassessment ?X - Simple Wound Assessment / Reassessment - one wound 1 5 ?[]  - 0 ?Complex Wound Assessment / Reassessment - multiple wounds ?[]  - 0 ?Dermatologic / Skin Assessment (not related to wound area) ?ASSESSMENTS - Focused Assessment ?[]  - Circumferential Edema Measurements - multi extremities 0 ?[]  - 0 ?Nutritional Assessment / Counseling / Intervention ?[]  - 0 ?Lower Extremity Assessment (monofilament, tuning fork, pulses) ?[]  - 0 ?Peripheral Arterial Disease Assessment (using hand held doppler) ?ASSESSMENTS - Ostomy and/or Continence Assessment and Care ?[]  - Incontinence Assessment and Management 0 ?[]  - 0 ?Ostomy Care Assessment and Management (repouching, etc.) ?PROCESS - Coordination of Care ?X - Simple Patient / Family Education for ongoing care 1 15 ?[]  - 0 ?Complex (extensive) Patient / Family Education for ongoing care ?[]  - 0 ?Staff obtains Consents, Records, Test Results / Process Orders ?[]  - 0 ?Staff telephones HHA, Nursing Homes / Clarify orders / etc ?[]  - 0 ?Routine Transfer to another Facility (non-emergent condition) ?[]  - 0 ?Routine Hospital Admission (non-emergent condition) ?[]  - 0 ?New Admissions / Biomedical engineer / Ordering NPWT, Apligraf, etc. ?[]  - 0 ?Emergency Hospital Admission (emergent condition) ?X- 1 10 ?Simple Discharge Coordination ?[]  - 0 ?Complex (extensive) Discharge Coordination ?PROCESS - Special Needs ?[]  - Pediatric / Minor Patient Management 0 ?[]  - 0 ?Isolation Patient Management ?[]  - 0 ?Hearing / Language / Visual special needs ?[]  - 0 ?Assessment of Community assistance (transportation, D/C planning, etc.) ?[]  - 0 ?Additional assistance /  Altered mentation ?[]  - 0 ?Support Surface(s) Assessment (bed, cushion, seat, etc.) ?INTERVENTIONS - Wound Cleansing / Measurement ?Tamara [name], [name] ([id number]) ?X- 1 5 ?Simple Wound Cleansing -  one wound ?[]  - 0 ?Complex Wound Cleansing - multiple wounds ?X- 1 5 ?Wound Imaging (photographs - any number of wounds) ?[]  - 0 ?Wound Tracing (instead of photographs) ?X- 1 5 ?Simple Wound Measurement - one wound ?[]  - 0 ?Complex Wound Measurement - multiple wounds ?INTERVENTIONS - Wound Dressings ?X - Small Wound Dressing one or multiple wounds 1 10 ?[]  - 0 ?Medium Wound Dressing one or multiple wounds ?[]  - 0 ?Large Wound Dressing one or multiple wounds ?[]  - 0 ?Application of Medications - topical ?[]  - 0 ?Application of Medications - injection ?INTERVENTIONS - Miscellaneous ?[]  - External ear exam 0 ?[]  - 0 ?Specimen Collection (cultures, biopsies, blood, body fluids, etc.) ?[]  - 0 ?Specimen(s) / Culture(s) sent or taken to Lab for analysis ?[]  - 0 ?Patient Transfer (multiple staff / Civil Service fast streamer / Similar devices) ?[]  - 0 ?Simple Staple / Suture removal (25 or less) ?[]  - 0 ?Complex Staple / Suture removal (26 or more) ?[]  - 0 ?Hypo / Hyperglycemic Management (close monitor of Blood Glucose) ?[]  - 0 ?Ankle / Brachial Index (ABI) - do not check if billed separately ?X- 1 5 ?Vital Signs ?Has the patient been seen at the hospital within the last three years: Yes ?Total Score: 75 ?Level Of Care: New/Established - Level ?2 ?Electronic Signature(s) ?Signed: 01/25/2022 4:20:50 PM By: Carlene Coria RN ?Entered By: Carlene Coria on 01/24/2022 08:25:03 ?Tamara [name], [name] ([id number]) ?-------------------------------------------------------------------------------- ?Encounter Discharge Information Details ?Patient Name: Tamara [name], Tamara [name] ?Date of Service: 01/24/2022 8:00 AM ?Medical Record Number: [id number] ?Patient Account Number: [id number] ?Date of Birth/Sex: [birth year] (75 y.o. F) ?Treating RN: Carlene Coria ?Primary Care  Matilynn Dacey: Deborra Medina Other Clinician: ?Referring Rayya Yagi: Deborra Medina ?Treating Artice Bergerson/Extender: Jeri Cos ?Weeks in Treatment: 3 ?Encounter Discharge Information Items ?Discharge Condition: Stable ?Ambulatory Status: Ambulatory ?Discharge Destination: Home ?Transportation: Private Auto ?Accompanied By: self ?Schedule Follow-up Appointment: Yes ?Clinical Summary of Care: Patient Declined ?Electronic Signature(s) ?Signed: 01/25/2022 4:20:50 PM By: Carlene Coria RN ?Entered By: Carlene Coria on 01/24/2022 08:26:06 ?Solecki, [name] ([id number]) ?-------------------------------------------------------------------------------- ?Lower Extremity Assessment Details ?Patient Name: Tamara [name], Tamara [name] ?Date of Service: 01/24/2022 8:00 AM ?Medical Record Number: [id number] ?Patient Account Number: [id number] ?Date of Birth/Sex: [birth year] (76 y.o. F) ?Treating RN: Carlene Coria ?Primary Care Ericha Whittingham: Deborra Medina Other Clinician: ?Referring Kreg Earhart: Deborra Medina ?Treating Sheridan Gettel/Extender: Jeri Cos ?Weeks in Treatment: 3 ?Electronic Signature(s) ?Signed: 01/25/2022 4:20:50 PM By: Carlene Coria RN ?Entered By: Carlene Coria on 01/24/2022 08:13:39 ?Friedmann, Katelyne R. ([id number]) ?-------------------------------------------------------------------------------- ?Multi Wound Chart Details ?Patient Name: Tamara [name], Tamara [name] ?Date of Service: 01/24/2022 8:00 AM ?Medical Record Number: [id number] ?Patient Account Number: [id number] ?Date of Birth/Sex: 10/27/1946 (76 y.o. F) ?Treating RN: Carlene Coria ?Primary Care Dontre Laduca: Deborra Medina Other Clinician: ?Referring Mariella Blackwelder: Deborra Medina ?Treating Vaden Becherer/Extender: Jeri Cos ?Weeks in Treatment: 3 ?Vital Signs ?Height(in): 61 ?Pulse(bpm): 98 ?Weight(lbs): 92 ?Blood Pressure(mmHg): 120/71 ?Body Mass Index(BMI): 17.4 ?Temperature(??F): 98.2 ?Respiratory Rate(breaths/min): 18 ?Photos: [N/A:N/A] ?Wound Location: Right, Medial Forearm N/A N/A ?Wounding Event: Trauma N/A  N/A ?Primary Etiology: Skin Tear N/A N/A ?Comorbid History: Chronic Obstructive Pulmonary N/A N/A ?Disease (COPD), Deep Vein ?Thrombosis ?Date Acquired: 11/28/2021 N/A N/A ?Weeks of Treatment: 3 N/A N/A ?Wound Status: Open N

## 2022-01-28 ENCOUNTER — Other Ambulatory Visit: Payer: Self-pay | Admitting: Family

## 2022-01-31 ENCOUNTER — Encounter: Payer: Medicare Other | Admitting: Physician Assistant

## 2022-01-31 ENCOUNTER — Other Ambulatory Visit: Payer: Self-pay

## 2022-01-31 DIAGNOSIS — J449 Chronic obstructive pulmonary disease, unspecified: Secondary | ICD-10-CM | POA: Diagnosis not present

## 2022-01-31 DIAGNOSIS — K5090 Crohn's disease, unspecified, without complications: Secondary | ICD-10-CM | POA: Diagnosis not present

## 2022-01-31 DIAGNOSIS — Z7901 Long term (current) use of anticoagulants: Secondary | ICD-10-CM | POA: Diagnosis not present

## 2022-01-31 DIAGNOSIS — S51811A Laceration without foreign body of right forearm, initial encounter: Secondary | ICD-10-CM | POA: Diagnosis not present

## 2022-01-31 DIAGNOSIS — L98498 Non-pressure chronic ulcer of skin of other sites with other specified severity: Secondary | ICD-10-CM | POA: Diagnosis not present

## 2022-01-31 NOTE — Progress Notes (Addendum)
HENRYETTA, CORRIVEAU R. ([id number]) ?Visit Report for 01/31/2022 ?Arrival Information Details ?Patient Name: Tamara [name], Tamara [name] ?Date of Service: 01/31/2022 8:00 AM ?Medical Record Number: [id number] ?Patient Account Number: [id number] ?Date of Birth/Sex: 1946/03/31 (76 y.o. F) ?Treating RN: Carlene Coria ?Primary Care Adelynn Gipe: Deborra Medina Other Clinician: ?Referring Brantly Kalman: Deborra Medina ?Treating Lahela Woodin/Extender: Jeri Cos ?Weeks in Treatment: 4 ?Visit Information History Since Last Visit ?All ordered tests and consults were completed: No ?Patient Arrived: Ambulatory ?Added or deleted any medications: No ?Arrival Time: 08:04 ?Any new allergies or adverse reactions: No ?Accompanied By: self ?Had a fall or experienced change in No ?Transfer Assistance: None ?activities of daily living that may affect ?Patient Identification Verified: Yes ?risk of falls: ?Secondary Verification Process Completed: Yes ?Signs or symptoms of abuse/neglect since last visito No ?Patient Requires Transmission-Based Precautions: No ?Hospitalized since last visit: No ?Patient Has Alerts: No ?Implantable device outside of the clinic excluding No ?cellular tissue based products placed in the center ?since last visit: ?Has Dressing in Place as Prescribed: Yes ?Pain Present Now: No ?Electronic Signature(s) ?Signed: 01/31/2022 4:22:30 PM By: Carlene Coria RN ?Entered By: Carlene Coria on 01/31/2022 08:08:10 ?Trochez, Mireyah R. ([id number]) ?-------------------------------------------------------------------------------- ?Clinic Level of Care Assessment Details ?Patient Name: AERIELLE, STOKLOSA ?Date of Service: 01/31/2022 8:00 AM ?Medical Record Number: [id number] ?Patient Account Number: [id number] ?Date of Birth/Sex: 1946-05-29 (77 y.o. F) ?Treating RN: Carlene Coria ?Primary Care Ciel Yanes: Deborra Medina Other Clinician: ?Referring Anissa Abbs: Deborra Medina ?Treating Rhonda Linan/Extender: Jeri Cos ?Weeks in Treatment: 4 ?Clinic Level of Care Assessment  Items ?TOOL 4 Quantity Score ?X - Use when only an EandM is performed on FOLLOW-UP visit 1 0 ?ASSESSMENTS - Nursing Assessment / Reassessment ?X - Reassessment of Co-morbidities (includes updates in patient status) 1 10 ?X- 1 5 ?Reassessment of Adherence to Treatment Plan ?ASSESSMENTS - Wound and Skin Assessment / Reassessment ?X - Simple Wound Assessment / Reassessment - one wound 1 5 ?[]  - 0 ?Complex Wound Assessment / Reassessment - multiple wounds ?[]  - 0 ?Dermatologic / Skin Assessment (not related to wound area) ?ASSESSMENTS - Focused Assessment ?[]  - Circumferential Edema Measurements - multi extremities 0 ?[]  - 0 ?Nutritional Assessment / Counseling / Intervention ?[]  - 0 ?Lower Extremity Assessment (monofilament, tuning fork, pulses) ?[]  - 0 ?Peripheral Arterial Disease Assessment (using hand held doppler) ?ASSESSMENTS - Ostomy and/or Continence Assessment and Care ?[]  - Incontinence Assessment and Management 0 ?[]  - 0 ?Ostomy Care Assessment and Management (repouching, etc.) ?PROCESS - Coordination of Care ?X - Simple Patient / Family Education for ongoing care 1 15 ?[]  - 0 ?Complex (extensive) Patient / Family Education for ongoing care ?[]  - 0 ?Staff obtains Consents, Records, Test Results / Process Orders ?[]  - 0 ?Staff telephones HHA, Nursing Homes / Clarify orders / etc ?[]  - 0 ?Routine Transfer to another Facility (non-emergent condition) ?[]  - 0 ?Routine Hospital Admission (non-emergent condition) ?[]  - 0 ?New Admissions / Biomedical engineer / Ordering NPWT, Apligraf, etc. ?[]  - 0 ?Emergency Hospital Admission (emergent condition) ?X- 1 10 ?Simple Discharge Coordination ?[]  - 0 ?Complex (extensive) Discharge Coordination ?PROCESS - Special Needs ?[]  - Pediatric / Minor Patient Management 0 ?[]  - 0 ?Isolation Patient Management ?[]  - 0 ?Hearing / Language / Visual special needs ?[]  - 0 ?Assessment of Community assistance (transportation, D/C planning, etc.) ?[]  - 0 ?Additional assistance /  Altered mentation ?[]  - 0 ?Support Surface(s) Assessment (bed, cushion, seat, etc.) ?INTERVENTIONS - Wound Cleansing / Measurement ?SAMERA, MACY R. ([id number]) ?X- 1 5 ?Simple Wound Cleansing -  one wound ?[]  - 0 ?Complex Wound Cleansing - multiple wounds ?X- 1 5 ?Wound Imaging (photographs - any number of wounds) ?[]  - 0 ?Wound Tracing (instead of photographs) ?X- 1 5 ?Simple Wound Measurement - one wound ?[]  - 0 ?Complex Wound Measurement - multiple wounds ?INTERVENTIONS - Wound Dressings ?[]  - Small Wound Dressing one or multiple wounds 0 ?[]  - 0 ?Medium Wound Dressing one or multiple wounds ?[]  - 0 ?Large Wound Dressing one or multiple wounds ?[]  - 0 ?Application of Medications - topical ?[]  - 0 ?Application of Medications - injection ?INTERVENTIONS - Miscellaneous ?[]  - External ear exam 0 ?[]  - 0 ?Specimen Collection (cultures, biopsies, blood, body fluids, etc.) ?[]  - 0 ?Specimen(s) / Culture(s) sent or taken to Lab for analysis ?[]  - 0 ?Patient Transfer (multiple staff / Civil Service fast streamer / Similar devices) ?[]  - 0 ?Simple Staple / Suture removal (25 or less) ?[]  - 0 ?Complex Staple / Suture removal (26 or more) ?[]  - 0 ?Hypo / Hyperglycemic Management (close monitor of Blood Glucose) ?[]  - 0 ?Ankle / Brachial Index (ABI) - do not check if billed separately ?X- 1 5 ?Vital Signs ?Has the patient been seen at the hospital within the last three years: Yes ?Total Score: 65 ?Level Of Care: New/Established - Level ?2 ?Electronic Signature(s) ?Signed: 01/31/2022 4:22:30 PM By: Carlene Coria RN ?Entered ByCarlene Coria on 01/31/2022 08:35:29 ?Sherfield, Debbe R. ([id number]) ?-------------------------------------------------------------------------------- ?Encounter Discharge Information Details ?Patient Name: MERCADES, BAJAJ ?Date of Service: 01/31/2022 8:00 AM ?Medical Record Number: [id number] ?Patient Account Number: [id number] ?Date of Birth/Sex: 1946/02/27 (76 y.o. F) ?Treating RN: Carlene Coria ?Primary Care Sanvika Cuttino:  Deborra Medina Other Clinician: ?Referring Julieta Rogalski: Deborra Medina ?Treating Meriel Kelliher/Extender: Jeri Cos ?Weeks in Treatment: 4 ?Encounter Discharge Information Items ?Discharge Condition: Stable ?Ambulatory Status: Ambulatory ?Discharge Destination: Home ?Transportation: Private Auto ?Accompanied By: self ?Schedule Follow-up Appointment: Yes ?Clinical Summary of Care: Patient Declined ?Electronic Signature(s) ?Signed: 01/31/2022 4:22:30 PM By: Carlene Coria RN ?Entered ByCarlene Coria on 01/31/2022 08:36:53 ?Lewelling, Mickie R. ([id number]) ?-------------------------------------------------------------------------------- ?Lower Extremity Assessment Details ?Patient Name: TAYJA, MANZER ?Date of Service: 01/31/2022 8:00 AM ?Medical Record Number: [id number] ?Patient Account Number: [id number] ?Date of Birth/Sex: 01-Mar-1946 (76 y.o. F) ?Treating RN: Carlene Coria ?Primary Care Demeisha Geraghty: Deborra Medina Other Clinician: ?Referring Markian Glockner: Deborra Medina ?Treating Phoenicia Pirie/Extender: Jeri Cos ?Weeks in Treatment: 4 ?Electronic Signature(s) ?Signed: 01/31/2022 4:22:30 PM By: Carlene Coria RN ?Entered By: Carlene Coria on 01/31/2022 08:13:10 ?Viele, Gunhild R. ([id number]) ?-------------------------------------------------------------------------------- ?Multi Wound Chart Details ?Patient Name: RINIYAH, SPEICH ?Date of Service: 01/31/2022 8:00 AM ?Medical Record Number: [id number] ?Patient Account Number: [id number] ?Date of Birth/Sex: 11-Dec-1945 (76 y.o. F) ?Treating RN: Carlene Coria ?Primary Care Dai Mcadams: Deborra Medina Other Clinician: ?Referring Damarrion Mimbs: Deborra Medina ?Treating Shontay Wallner/Extender: Jeri Cos ?Weeks in Treatment: 4 ?Vital Signs ?Height(in): 61 ?Pulse(bpm): 98 ?Weight(lbs): 92 ?Blood Pressure(mmHg): 120/72 ?Body Mass Index(BMI): 17.4 ?Temperature(??F): 98.4 ?Respiratory Rate(breaths/min): 18 ?Photos: [N/A:N/A] ?Wound Location: Right, Medial Forearm N/A N/A ?Wounding Event: Trauma N/A N/A ?Primary  Etiology: Skin Tear N/A N/A ?Comorbid History: Chronic Obstructive Pulmonary N/A N/A ?Disease (COPD), Deep Vein ?Thrombosis ?Date Acquired: 11/28/2021 N/A N/A ?Weeks of Treatment: 4 N/A N/A ?Wound Status: Open N/A

## 2022-02-02 NOTE — Progress Notes (Signed)
AMAREE, LOISEL R. ([id number]) ?Visit Report for 01/31/2022 ?Chief Complaint Document Details ?Patient Name: Tamara [name], Tamara [name] ?Date of Service: 01/31/2022 8:00 AM ?Medical Record Number: [id number] ?Patient Account Number: [id number] ?Date of Birth/Sex: Jul 23, 1946 (76 y.o. F) ?Treating RN: Carlene Coria ?Primary Care Provider: Deborra Medina Other Clinician: ?Referring Provider: Deborra Medina ?Treating Provider/Extender: Jeri Cos ?Weeks in Treatment: 4 ?Information Obtained from: Patient ?Chief Complaint ?Right forearm laceration/skin tear ?Electronic Signature(s) ?Signed: 01/31/2022 8:34:11 AM By: Worthy Keeler PA-C ?Entered By: Worthy Keeler on 01/31/2022 08:34:11 ?Boedecker, Yaretzy R. ([id number]) ?-------------------------------------------------------------------------------- ?HPI Details ?Patient Name: Tamara [name] ?Date of Service: 01/31/2022 8:00 AM ?Medical Record Number: [id number] ?Patient Account Number: [id number] ?Date of Birth/Sex: 10/17/1946 (76 y.o. F) ?Treating RN: Carlene Coria ?Primary Care Provider: Deborra Medina Other Clinician: ?Referring Provider: Deborra Medina ?Treating Provider/Extender: Jeri Cos ?Weeks in Treatment: 4 ?History of Present Illness ?HPI Description: 01/03/2022 upon evaluation today patient presents for initial evaluation here in the clinic concerning issues she had around mid ?January when her 2 little dogs were on her lap and got into a fight. She tried to get them off of her and subsequently through her sweatshirt she ?sustained a quite significant skin tear to the right forearm. She was seen in the ER where they did suture this as best they could but it was a fairly ?extensive suture repair and left an area that was still open to heal by second intent. Dr. Derrel Nip has been managing this for her but finally got to the ?point where she felt like the wound center may be helpful in getting this to close finally. Fortunately I do not see any signs of active infection ?locally  nor systemically at this time. ?The patient has had apparently a CVA in the past and is on anticoagulants actually not for that before an issue with a blood clot following a ?surgery on her knee. She also has Crohn's disease and COPD. Currently she is on Coumadin as an anticoagulant. ?01/10/2022 upon evaluation today patient appears to be doing well with regard to her wounds. She has been tolerating the dressing changes ?without complication. Fortunately there does not appear to be any evidence of active infection locally nor systemically at this point which is great ?news. No fevers, chills, nausea, vomiting, or diarrhea. ?01/24/2022 upon evaluation today patient's arm is actually very close to complete resolution. She seems to be doing excellent there is a lot of new ?skin hearing though its not completely closed and it is very close to being such. I do not see any evidence of active infection locally or systemically ?which is great news she has been using Xeroform and a border foam dressing. ?01/31/2022 upon evaluation today patient appears to be doing excellent in regard to her arm ulcer. In fact this appears to be completely healed ?which is great news. Fortunately there does not appear to be any evidence of active infection locally nor systemically which is great news. ?Electronic Signature(s) ?Signed: 01/31/2022 9:11:13 AM By: Worthy Keeler PA-C ?Entered By: Worthy Keeler on 01/31/2022 09:11:12 ?Sayer, Alea R. ([id number]) ?-------------------------------------------------------------------------------- ?Physical Exam Details ?Patient Name: [name], [name] ?Date of Service: 01/31/2022 8:00 AM ?Medical Record Number: [id number] ?Patient Account Number: [id number] ?Date of Birth/Sex: Nov 02, 1946 (76 y.o. F) ?Treating RN: Carlene Coria ?Primary Care Provider: Deborra Medina Other Clinician: ?Referring Provider: Deborra Medina ?Treating Provider/Extender: Jeri Cos ?Weeks in Treatment:  4 ?Constitutional ?Well-nourished and well-hydrated in no acute distress. ?Respiratory ?normal breathing without difficulty. ?Psychiatric ?this patient is able  to make decisions and demonstrates good insight into disease process. Alert and Oriented x 3. pleasant and cooperative. ?Notes ?Patient's wound bed showed evidence of good granulation and epithelization at this point. Fortunately I do not see any evidence of infection at this ?time which is great and overall I am extremely pleased with where we stand today. No fevers, chills, nausea, vomiting, or diarrhea. ?Electronic Signature(s) ?Signed: 01/31/2022 9:11:29 AM By: Worthy Keeler PA-C ?Entered By: Worthy Keeler on 01/31/2022 09:11:29 ?Cooley, Jasiyah R. ([id number]) ?-------------------------------------------------------------------------------- ?Physician Orders Details ?Patient Name: Tamara [name] ?Date of Service: 01/31/2022 8:00 AM ?Medical Record Number: [id number] ?Patient Account Number: [id number] ?Date of Birth/Sex: Jan 30, 1946 (76 y.o. F) ?Treating RN: Carlene Coria ?Primary Care Provider: Deborra Medina Other Clinician: ?Referring Provider: Deborra Medina ?Treating Provider/Extender: Jeri Cos ?Weeks in Treatment: 4 ?Verbal / Phone Orders: No ?Diagnosis Coding ?Discharge From [facility] ?o Discharge from [facility] Complete - cover times 2 weeks for protection ?Electronic Signature(s) ?Signed: 01/31/2022 4:22:30 PM By: Carlene Coria RN ?Signed: 02/01/2022 5:58:01 PM By: Worthy Keeler PA-C ?Entered ByCarlene Coria on 01/31/2022 08:32:23 ?Lesko, Aziah R. ([id number]) ?-------------------------------------------------------------------------------- ?Problem List Details ?Patient Name: Tamara [name] ?Date of Service: 01/31/2022 8:00 AM ?Medical Record Number: [id number] ?Patient Account Number: [id number] ?Date of Birth/Sex: Oct 04, 1946 (76 y.o. F) ?Treating RN: Carlene Coria ?Primary Care Provider: Deborra Medina Other  Clinician: ?Referring Provider: Deborra Medina ?Treating Provider/Extender: Jeri Cos ?Weeks in Treatment: 4 ?Active Problems ?ICD-10 ?Encounter ?Code Description Active Date MDM ?Diagnosis ?H88.875Z Laceration without foreign body of right forearm, initial encounter 01/03/2022 No Yes ?J44.9 Chronic obstructive pulmonary disease, unspecified 01/03/2022 No Yes ?K50.90 Crohn's disease, unspecified, without complications 9/72/8206 No Yes ?Z79.01 Long term (current) use of anticoagulants 01/03/2022 No Yes ?Inactive Problems ?Resolved Problems ?Electronic Signature(s) ?Signed: 01/31/2022 8:34:08 AM By: Worthy Keeler PA-C ?Entered By: Worthy Keeler on 01/31/2022 08:34:08 ?Ritzel, Kelsey R. ([id number]) ?-------------------------------------------------------------------------------- ?Progress Note Details ?Patient Name: GUDELIA, EUGENE ?Date of Service: 01/31/2022 8:00 AM ?Medical Record Number: [id number] ?Patient Account Number: [id number] ?Date of Birth/Sex: 1946/07/11 (76 y.o. F) ?Treating RN: Carlene Coria ?Primary Care Provider: Deborra Medina Other Clinician: ?Referring Provider: Deborra Medina ?Treating Provider/Extender: Jeri Cos ?Weeks in Treatment: 4 ?Subjective ?Chief Complaint ?Information obtained from Patient ?Right forearm laceration/skin tear ?History of Present Illness (HPI) ?01/03/2022 upon evaluation today patient presents for initial evaluation here in the clinic concerning issues she had around mid January when her ?2 little dogs were on her lap and got into a fight. She tried to get them off of her and subsequently through her sweatshirt she sustained a quite ?significant skin tear to the right forearm. She was seen in the ER where they did suture this as best they could but it was a fairly extensive suture ?repair and left an area that was still open to heal by second intent. Dr. Derrel Nip has been managing this for her but finally got to the point where she ?felt like the wound center may be helpful  in getting this to close finally. Fortunately I do not see any signs of active infection locally nor ?systemically at this time. ?The patient has had apparently a CVA in the past and is on anticoagulants actually not for that before an

## 2022-02-05 ENCOUNTER — Ambulatory Visit
Admission: RE | Admit: 2022-02-05 | Discharge: 2022-02-05 | Disposition: A | Discharge status: Home / Self Care | Payer: Medicare Other | Source: Ambulatory Visit | Attending: Neurosurgery | Admitting: Neurosurgery

## 2022-02-05 DIAGNOSIS — M4807 Spinal stenosis, lumbosacral region: Secondary | ICD-10-CM | POA: Diagnosis not present

## 2022-02-05 DIAGNOSIS — M47813 Spondylosis without myelopathy or radiculopathy, cervicothoracic region: Secondary | ICD-10-CM | POA: Diagnosis not present

## 2022-02-05 DIAGNOSIS — G8929 Other chronic pain: Secondary | ICD-10-CM | POA: Diagnosis not present

## 2022-02-05 DIAGNOSIS — M5416 Radiculopathy, lumbar region: Secondary | ICD-10-CM

## 2022-02-05 DIAGNOSIS — Z981 Arthrodesis status: Secondary | ICD-10-CM | POA: Diagnosis not present

## 2022-02-05 DIAGNOSIS — M5127 Other intervertebral disc displacement, lumbosacral region: Secondary | ICD-10-CM | POA: Diagnosis not present

## 2022-02-05 DIAGNOSIS — M5023 Other cervical disc displacement, cervicothoracic region: Secondary | ICD-10-CM | POA: Diagnosis not present

## 2022-02-05 DIAGNOSIS — M545 Low back pain, unspecified: Secondary | ICD-10-CM | POA: Diagnosis not present

## 2022-02-05 DIAGNOSIS — Z01818 Encounter for other preprocedural examination: Secondary | ICD-10-CM | POA: Diagnosis not present

## 2022-02-05 DIAGNOSIS — M5442 Lumbago with sciatica, left side: Secondary | ICD-10-CM | POA: Diagnosis not present

## 2022-02-05 DIAGNOSIS — M5441 Lumbago with sciatica, right side: Secondary | ICD-10-CM | POA: Diagnosis not present

## 2022-02-05 DIAGNOSIS — M419 Scoliosis, unspecified: Secondary | ICD-10-CM | POA: Diagnosis not present

## 2022-02-07 ENCOUNTER — Encounter: Payer: Medicare Other | Admitting: Physician Assistant

## 2022-02-19 DIAGNOSIS — M5416 Radiculopathy, lumbar region: Secondary | ICD-10-CM | POA: Diagnosis not present

## 2022-02-21 ENCOUNTER — Ambulatory Visit (INDEPENDENT_AMBULATORY_CARE_PROVIDER_SITE_OTHER): Payer: Medicare Other

## 2022-02-21 VITALS — Ht 63.0 in | Wt 93.0 lb

## 2022-02-21 DIAGNOSIS — Z0000 Encounter for general adult medical examination without abnormal findings: Secondary | ICD-10-CM

## 2022-02-21 NOTE — Patient Instructions (Addendum)
Tamara [name] , Thank you for taking time to come for your Medicare Wellness Visit. I appreciate your ongoing commitment to your health goals. Please review the following plan we discussed and let me know if I can assist you in the future.   These are the goals we discussed:  Goals      Quit Smoking        This is a list of the screening recommended for you and due dates:  Health Maintenance  Topic Date Due   COVID-19 Vaccine (4 - Booster for Pfizer series) 03/09/2022*   Zoster (Shingles) Vaccine (1 of 2) 05/23/2022*   Flu Shot  06/11/2022   Colon Cancer Screening  09/26/2025   Tetanus Vaccine  11/29/2031   Pneumonia Vaccine  Completed   DEXA scan (bone density measurement)  Completed   Hepatitis C Screening: USPSTF Recommendation to screen - Ages 20-79 yo.  Completed   HPV Vaccine  Aged Out   Mammogram  Discontinued  *Topic was postponed. The date shown is not the original due date.   Opioid Pain Medicine Management Opioids are powerful medicines that are used to treat moderate to severe pain. When used for short periods of time, they can help you to: Sleep better. Do better in physical or occupational therapy. Feel better in the first few days after an injury. Recover from surgery. Opioids should be taken with the supervision of a trained health care provider. They should be taken for the shortest period of time possible. This is because opioids can be addictive, and the longer you take opioids, the greater your risk of addiction. This addiction can also be called opioid use disorder. What are the risks? Using opioid pain medicines for longer than 3 days increases your risk of side effects. Side effects include: Constipation. Nausea and vomiting. Breathing difficulties (respiratory depression). Drowsiness. Confusion. Opioid use disorder. Itching. Taking opioid pain medicine for a long period of time can affect your ability to do daily tasks. It also puts you at risk  for: Motor vehicle crashes. Depression. Suicide. Heart attack. Overdose, which can be life-threatening. What is a pain treatment plan? A pain treatment plan is an agreement between you and your health care provider. Pain is unique to each person, and treatments vary depending on your condition. To manage your pain, you and your health care provider need to work together. To help you do this: Discuss the goals of your treatment, including how much pain you might expect to have and how you will manage the pain. Review the risks and benefits of taking opioid medicines. Remember that a good treatment plan uses more than one approach and minimizes the chance of side effects. Be honest about the amount of medicines you take and about any drug or alcohol use. Get pain medicine prescriptions from only one health care provider. Pain can be managed with many types of alternative treatments. Ask your health care provider to refer you to one or more specialists who can help you manage pain through: Physical or occupational therapy. Counseling (cognitive behavioral therapy). Good nutrition. Biofeedback. Massage. Meditation. Non-opioid medicine. Following a gentle exercise program. How to use opioid pain medicine Taking medicine Take your pain medicine exactly as told by your health care provider. Take it only when you need it. If your pain gets less severe, you may take less than your prescribed dose if your health care provider approves. If you are not having pain, do nottake pain medicine unless your health care provider tells  you to take it. If your pain is severe, do nottry to treat it yourself by taking more pills than instructed on your prescription. Contact your health care provider for help. Write down the times when you take your pain medicine. It is easy to become confused while on pain medicine. Writing the time can help you avoid overdose. Take other over-the-counter or prescription  medicines only as told by your health care provider. Keeping yourself and others safe  While you are taking opioid pain medicine: Do not drive, use machinery, or power tools. Do not sign legal documents. Do not drink alcohol. Do not take sleeping pills. Do not supervise children by yourself. Do not do activities that require climbing or being in high places. Do not go to a lake, river, ocean, spa, or swimming pool. Do not share your pain medicine with anyone. Keep pain medicine in a locked cabinet or in a secure area where pets and children cannot reach it. Stopping your use of opioids If you have been taking opioid medicine for more than a few weeks, you may need to slowly decrease (taper) how much you take until you stop completely. Tapering your use of opioids can decrease your risk of symptoms of withdrawal, such as: Pain and cramping in the abdomen. Nausea. Sweating. Sleepiness. Restlessness. Uncontrollable shaking (tremors). Cravings for the medicine. Do not attempt to taper your use of opioids on your own. Talk with your health care provider about how to do this. Your health care provider may prescribe a step-down schedule based on how much medicine you are taking and how long you have been taking it. Getting rid of leftover pills Do not save any leftover pills. Get rid of leftover pills safely by: Taking the medicine to a prescription take-back program. This is usually offered by the county or law enforcement. Bringing them to a pharmacy that has a drug disposal container. Flushing them down the toilet. Check the label or package insert of your medicine to see whether this is safe to do. Throwing them out in the trash. Check the label or package insert of your medicine to see whether this is safe to do. If it is safe to throw it out, remove the medicine from the original container, put it into a sealable bag or container, and mix it with used coffee grounds, food scraps, dirt, or  cat litter before putting it in the trash. Follow these instructions at home: Activity Do exercises as told by your health care provider. Avoid activities that make your pain worse. Return to your normal activities as told by your health care provider. Ask your health care provider what activities are safe for you. General instructions You may need to take these actions to prevent or treat constipation: Drink enough fluid to keep your urine pale yellow. Take over-the-counter or prescription medicines. Eat foods that are high in fiber, such as beans, whole grains, and fresh fruits and vegetables. Limit foods that are high in fat and processed sugars, such as fried or sweet foods. Keep all follow-up visits. This is important. Where to find support If you have been taking opioids for a long time, you may benefit from receiving support for quitting from a local support group or counselor. Ask your health care provider for a referral to these resources in your area. Where to find more information Centers for Disease Control and Prevention (CDC): FootballExhibition.com.br [place] Food and Drug Administration (FDA): PumpkinSearch.com.ee Get help right away if: You may have taken too  much of an opioid (overdosed). Common symptoms of an overdose: Your breathing is slower or more shallow than normal. You have a very slow heartbeat (pulse). You have slurred speech. You have nausea and vomiting. Your pupils become very small. You have other potential symptoms: You are very confused. You faint or feel like you will faint. You have cold, clammy skin. You have blue lips or fingernails. You have thoughts of harming yourself or harming others. These symptoms may represent a serious problem that is an emergency. Do not wait to see if the symptoms will go away. Get medical help right away. Call your local emergency services (911 in the [place] Do not drive yourself to the hospital.  If you ever feel like you may hurt yourself or  others, or have thoughts about taking your own life, get help right away. Go to your nearest emergency department or: Call your local emergency services (911 in the [place] Call the [facility] (657-221-0389 in the [place] Call a suicide crisis helpline, such as the National Suicide Prevention Lifeline at (226) 222-4406 or 988 in the [place] This is open 24 hours a day in the [place] Text the Crisis Text Line at 859-313-8828 (in the [place] Summary Opioid medicines can help you manage moderate to severe pain for a short period of time. A pain treatment plan is an agreement between you and your health care provider. Discuss the goals of your treatment, including how much pain you might expect to have and how you will manage the pain. If you think that you or someone else may have taken too much of an opioid, get medical help right away. This information is not intended to replace advice given to you by your health care provider. Make sure you discuss any questions you have with your health care provider. Document Revised: 05/23/2021 Document Reviewed: 02/07/2021 Elsevier Patient Education  2022 [employer].

## 2022-02-21 NOTE — Progress Notes (Addendum)
Subjective:   Tamara [name] is a 76 y.o. female who presents for Medicare Annual (Subsequent) preventive examination.  Review of Systems    No ROS.  Medicare Wellness Virtual Visit.  Visual/audio telehealth visit, UTA vital signs.   See social history for additional risk factors.   Cardiac Risk Factors include: advanced age (>40men, >60 women)     Objective:    Today's Vitals   02/21/22 1234  Weight: 93 lb (42.2 kg)  Height: 5\' 3"  (1.6 m)   Body mass index is 16.47 kg/m.     02/21/2022   12:40 PM 11/06/2021   12:14 PM 11/02/2021   10:41 AM 09/18/2021    2:35 PM 09/11/2021    4:40 PM 05/25/2021    9:21 AM 05/23/2021    9:52 AM  Advanced Directives  Does Patient Have a Medical Advance Directive? No No No No No No No  Would patient like information on creating a medical advance directive? No - Patient declined No - Patient declined No - Patient declined No - Patient declined No - Patient declined No - Patient declined     Current Medications (verified) Outpatient Encounter Medications as of 02/21/2022  Medication Sig   clonazePAM (KLONOPIN) 0.5 MG tablet Take 1 tablet (0.5 mg total) by mouth daily as needed for anxiety.   cyanocobalamin (,VITAMIN B-12,) 1000 MCG/ML injection Inject 1 mL (1,000 mcg total) into the muscle every 30 (thirty) days.   Fluticasone-Salmeterol (ADVAIR) 250-50 MCG/DOSE AEPB Inhale 1 puff into the lungs 2 (two) times daily as needed (shortness of braeth).   gabapentin (NEURONTIN) 300 MG capsule Take 900 mg by mouth at bedtime.   nortriptyline (PAMELOR) 25 MG capsule Take 1 capsule (25 mg total) by mouth at bedtime.   omeprazole (PRILOSEC) 40 MG capsule TAKE 1 CAPSULE BY MOUTH TWICE DAILY   oxyCODONE (OXY IR/ROXICODONE) 5 MG immediate release tablet Take 1 tablet (5 mg total) by mouth every 6 (six) hours as needed for severe pain.   rosuvastatin (CRESTOR) 10 MG tablet TAKE 1 TABLET BY MOUTH AT BEDTIME   sertraline (ZOLOFT) 100 MG tablet TAKE 1 TABLET  BY MOUTH ONCE DAILY (Patient taking differently: Take 100 mg by mouth at bedtime.)   silver sulfADIAZINE (SILVADENE) 1 % cream Apply to affected area daily   Syringe/Needle, Disp, (SYRINGE 3CC/25GX1") 25G X 1" 3 ML MISC Use for b12 injections   tiotropium (SPIRIVA HANDIHALER) 18 MCG inhalation capsule Place 1 capsule (18 mcg total) into inhaler and inhale daily as needed (shortness of breath). (Patient not taking: Reported on 12/14/2021)   traZODone (DESYREL) 50 MG tablet Take 2 tablets (100 mg total) by mouth at bedtime.   warfarin (COUMADIN) 1 MG tablet TAKE 2 TABLET BY MOUTH ONCE DAILY WITH THE 5MG  FOR 7MG  TOTAL DAILY (Patient taking differently: 7 mg daily)   warfarin (COUMADIN) 5 MG tablet Take 1 tablet (5 mg total) by mouth at bedtime. Take with 2 mg to equal 7 mg every night   No facility-administered encounter medications on file as of 02/21/2022.    Allergies (verified) Apixaban and Sulfa antibiotics   History: Past Medical History:  Diagnosis Date   Aortic atherosclerosis (HCC)    Arthritis    B12 deficiency    Bowel obstruction (HCC)    s/p laparotomy/ LOA 3/08, prior reversal of jejunal bypasss 2004   Carpal tunnel syndrome    Chronic abdominal pain    Clostridium difficile infection 05/24/2021   COPD (chronic obstructive pulmonary disease) (HCC)  no inhalers   Crohn's disease (HCC)    remission 2016   Depression    DVT (deep venous thrombosis) (HCC) 04/24/2018   DVT (deep venous thrombosis) (HCC) 05/2021   left leg   Dysphagia 12/04/2017   Facial twitching 07/11/2018   GERD (gastroesophageal reflux disease)    Headache    History of DVT (deep vein thrombosis) 01/2007   post operative right leg, s/p vena cava filter   History of hiatal hernia    History of kidney stones    Intractable chronic migraine without aura and without status migrainosus 05/20/2019   Leukocytosis    Leukocytosis    Osteoporosis    Pneumonia    Sleep apnea    Small bowel obstruction  (HCC)    2001, 2004   Past Surgical History:  Procedure Laterality Date   ABDOMINAL HYSTERECTOMY     ANAL SPHINCTER PROSTHESIS PLACEMENT     APPENDECTOMY     CARPAL TUNNEL RELEASE Right 04/22/2014   CATARACT EXTRACTION     CERVICAL FUSION  2005   C6-7, anterior approach   CHOLECYSTECTOMY     COLONOSCOPY  2015   COLONOSCOPY     2005, 2007, 2010, 2015, 2020, 2021   COLONOSCOPY WITH PROPOFOL N/A 05/10/2019   Procedure: COLONOSCOPY WITH PROPOFOL;  Surgeon: Scot Jun, MD;  Location: [facility] ENDOSCOPY;  Service: Endoscopy;  Laterality: N/A;   ESOPHAGEAL MANOMETRY N/A 02/11/2018   Procedure: ESOPHAGEAL MANOMETRY (EM);  Surgeon: Midge Minium, MD;  Location: [facility] ENDOSCOPY;  Service: Endoscopy;  Laterality: N/A;   ESOPHAGOGASTRODUODENOSCOPY     1996, 2006, 2008, 2010, 2014, 2018, 2021   ESOPHAGOGASTRODUODENOSCOPY (EGD) WITH PROPOFOL N/A 12/17/2017   Procedure: ESOPHAGOGASTRODUODENOSCOPY (EGD) WITH PROPOFOL;  Surgeon: Earline Mayotte, MD;  Location: [facility] ENDOSCOPY;  Service: Endoscopy;  Laterality: N/A;   ESOPHAGOGASTRODUODENOSCOPY (EGD) WITH PROPOFOL N/A 01/08/2018   Procedure: ESOPHAGOGASTRODUODENOSCOPY (EGD) WITH PROPOFOL;  Surgeon: Earline Mayotte, MD;  Location: [facility] ENDOSCOPY;  Service: Endoscopy;  Laterality: N/A;   EXCISION NEUROMA Left 09/18/2021   Procedure: Excision of traumatic neuroma infrapatellar branch saphenous nerve left knee.;  Surgeon: Christena Flake, MD;  Location: [facility] ORS;  Service: Orthopedics;  Laterality: Left;   gyn surgery     hysterectomy   INSERTION OF VENA CAVA FILTER Right    KNEE ARTHROSCOPY WITH MEDIAL MENISECTOMY Left 06/22/2020   Procedure: Left knee arthroscopic partial medial meniscectomy;  Surgeon: Signa Kell, MD;  Location: [facility];  Service: Orthopedics;  Laterality: Left;   KNEE ARTHROSCOPY WITH MEDIAL MENISECTOMY Left 05/25/2021   Procedure: Left knee arthroscopy, infrapatellar fat pad excision;  Surgeon: Signa Kell, MD;   Location: [facility] ORS;  Service: Orthopedics;  Laterality: Left;   LAPAROTOMY  01/30/2007   for bowel obstruction with LOA   LUMBAR LAMINECTOMY/DECOMPRESSION MICRODISCECTOMY N/A 12/25/2016   Procedure: LUMBAR DECOMPRESSION L4-5;  Surgeon: Venetia Night, MD;  Location: [facility] ORS;  Service: Neurosurgery;  Laterality: N/A;   NISSEN FUNDOPLICATION     ROTATOR CUFF REPAIR Bilateral    SPLENECTOMY     secondary to colonoscopy   TONSILLECTOMY AND ADENOIDECTOMY     TRIGGER FINGER RELEASE Left 08/25/2015   TRIGGER FINGER RELEASE Right 09/18/2015   VAGINAL HYSTERECTOMY     Family History  Problem Relation Age of Onset   Diabetes Mother        type 2   Heart disease Mother    Hypertension Mother    Coronary artery disease Father    Heart attack Sister  Social History   Socioeconomic History   Marital status: Widowed    Spouse name: Not on file   Number of children: 3   Years of education: Not on file   Highest education level: Not on file  Occupational History   Not on file  Tobacco Use   Smoking status: Every Day    Packs/day: 1.00    Years: 50.00    Pack years: 50.00    Types: Cigarettes   Smokeless tobacco: Never  Vaping Use   Vaping Use: Never used  Substance and Sexual Activity   Alcohol use: No    Alcohol/week: 0.0 standard drinks   Drug use: No   Sexual activity: Not on file  Other Topics Concern   Not on file  Social History Narrative   Three sons   Social Determinants of Health   Financial Resource Strain: Low Risk    Difficulty of Paying Living Expenses: Not hard at all  Food Insecurity: No Food Insecurity   Worried About Programme researcher, broadcasting/film/video in the Last Year: Never true   Ran Out of Food in the Last Year: Never true  Transportation Needs: No Transportation Needs   Lack of Transportation (Medical): No   Lack of Transportation (Non-Medical): No  Physical Activity: Unknown   Days of Exercise per Week: 0 days   Minutes of Exercise per Session: Not on file   Stress: No Stress Concern Present   Feeling of Stress : Not at all  Social Connections: Not on file    Tobacco Counseling Ready to quit: Not Answered Counseling given: Not Answered   Clinical Intake:  Pre-visit preparation completed: Yes        Diabetes: No  How often do you need to have someone help you when you read instructions, pamphlets, or other written materials from your doctor or pharmacy?: 1 - Never    Interpreter Needed?: No      Activities of Daily Living    02/21/2022   12:41 PM 11/02/2021   10:44 AM  In your present state of health, do you have any difficulty performing the following activities:  Hearing? 0   Vision? 0   Difficulty concentrating or making decisions? 0   Walking or climbing stairs? 1   Dressing or bathing? 0   Doing errands, shopping? 0 1  Comment  due to pain  Preparing Food and eating ? N   Using the Toilet? N   In the past six months, have you accidently leaked urine? N   Do you have problems with loss of bowel control? N   Managing your Medications? N   Managing your Finances? N   Housekeeping or managing your Housekeeping? N     Patient Care Team: Sherlene Shams, MD as PCP - General (Internal Medicine)  Indicate any recent Medical Services you may have received from other than [facility] providers in the past year (date may be approximate).     Assessment:   This is a routine wellness examination for Albia.  Virtual Visit via Telephone Note  I connected with  TALYA LIVERMORE on 02/21/22 at 12:30 PM EDT by telephone and verified that I am speaking with the correct person using two identifiers.  Persons participating in the virtual visit: patient/Nurse Health Advisor   I discussed the limitations of performing an evaluation and management service by telehealth. The patient expressed understanding and agreed to proceed. We continued and completed visit with audio only. Some vital signs may be  absent or patient reported.    Hearing/Vision screen Hearing Screening - Comments:: Patient is able to hear conversational tones without difficulty.  No issues reported.  Dietary issues and exercise activities discussed: Current Exercise Habits: Home exercise routine, Intensity: Mild Regular diet Good water intake   Goals Addressed             This Visit's Progress    Quit Smoking         Depression Screen    02/21/2022   12:39 PM 12/14/2021   11:35 AM 06/08/2021    2:09 PM 05/03/2021   10:15 AM 01/29/2021    9:22 AM 07/31/2020    8:54 AM 04/09/2019    9:38 AM  PHQ 2/9 Scores  PHQ - 2 Score 0 0 0 1 0 0 0  PHQ- 9 Score    4 1      Fall Risk    02/21/2022   12:41 PM 12/14/2021   11:34 AM 12/11/2021   11:26 AM 12/10/2021    8:53 AM 11/08/2021   12:05 PM  Fall Risk   Falls in the past year? 0 1 0 0 0  Number falls in past yr: 0 0  0   Injury with Fall?  1  0   Risk for fall due to :  History of fall(s)  No Fall Risks No Fall Risks  Follow up Falls evaluation completed Falls evaluation completed  Falls evaluation completed Falls evaluation completed    FALL RISK PREVENTION PERTAINING TO THE HOME: Home free of loose throw rugs in walkways, pet beds, electrical cords, etc? Yes  Adequate lighting in your home to reduce risk of falls? Yes   ASSISTIVE DEVICES UTILIZED TO PREVENT FALLS: Life alert? No  Use of a cane, walker or w/c? No  Grab bars in the bathroom? Yes  Shower chair or bench in shower? Yes Elevated toilet seat or a handicapped toilet? No   TIMED UP AND GO: Was the test performed? No .   Cognitive Function:  Patient is alert and oriented x3.       Immunizations Immunization History  Administered Date(s) Administered   Fluad Quad(high Dose 65+) 07/16/2019, 07/31/2020, 07/24/2021   Influenza,inj,Quad PF,6+ Mos 09/08/2014, 08/24/2015, 07/08/2018   Influenza-Unspecified 09/01/2013, 08/23/2016, 08/30/2017   PFIZER(Purple Top)SARS-COV-2 Vaccination 12/28/2019, 01/18/2020, 08/11/2020    Pneumococcal Conjugate-13 09/08/2014, 02/21/2015   Pneumococcal Polysaccharide-23 05/11/2013, 02/20/2014   Tdap 07/03/2015, 11/28/2021    Shingrix Completed?: No.    Education has been provided regarding the importance of this vaccine. Patient has been advised to call insurance company to determine out of pocket expense if they have not yet received this vaccine. Advised may also receive vaccine at local pharmacy or Health Dept. Verbalized acceptance and understanding.  Screening Tests Health Maintenance  Topic Date Due   COVID-19 Vaccine (4 - Booster for Pfizer series) 03/09/2022 (Originally 10/06/2020)   Zoster Vaccines- Shingrix (1 of 2) 05/23/2022 (Originally 11/03/1965)   INFLUENZA VACCINE  06/11/2022   COLONOSCOPY (Pts 45-67yrs Insurance coverage will need to be confirmed)  09/26/2025   TETANUS/TDAP  11/29/2031   Pneumonia Vaccine 34+ Years old  Completed   DEXA SCAN  Completed   Hepatitis C Screening  Completed   HPV VACCINES  Aged Out   MAMMOGRAM  Discontinued   Health Maintenance There are no preventive care reminders to display for this patient.  Mammogram- discontinued per patient.   Lung Cancer Screening: (Low Dose CT Chest recommended if Age 56-80 years, 30 pack-year  currently smoking OR have quit w/in 15years.) does not qualify.   Vision Screening: Recommended annual ophthalmology exams for early detection of glaucoma and other disorders of the eye.  Dental Screening: Recommended annual dental exams for proper oral hygiene  Community Resource Referral / Chronic Care Management: CRR required this visit?  No   CCM required this visit?  No      Plan:   Keep all routine maintenance appointments.   I have personally reviewed and noted the following in the patient's chart:   Medical and social history Use of alcohol, tobacco or illicit drugs  Current medications and supplements including opioid prescriptions. Taking oxycodone. Followed by Pain Clinic.   Functional ability and status Nutritional status Physical activity Advanced directives List of other physicians Hospitalizations, surgeries, and ER visits in previous 12 months Vitals Screenings to include cognitive, depression, and falls Referrals and appointments  In addition, I have reviewed and discussed with patient certain preventive protocols, quality metrics, and best practice recommendations. A written personalized care plan for preventive services as well as general preventive health recommendations were provided to patient.     OBrien-Blaney, Ansen Sayegh L, LPN   1/61/0960     I have reviewed the above information and agree with above.   Duncan Dull, MD

## 2022-02-27 ENCOUNTER — Encounter: Payer: Self-pay | Admitting: Internal Medicine

## 2022-02-27 ENCOUNTER — Other Ambulatory Visit: Payer: Self-pay | Admitting: Family

## 2022-02-27 NOTE — Telephone Encounter (Signed)
Refilled: 12/27/2021 ?Last OV: 12/14/2021 ?Next OV: not scheduled ?

## 2022-02-27 NOTE — Telephone Encounter (Signed)
Pt called in requesting for refill on medication (clonazePAM (KLONOPIN) 0.5 MG tablet)... Pt stated that she went to the pharmacy to refill medication and they advised her that she would have to call her provider... Pt requesting callback ?

## 2022-02-27 NOTE — Addendum Note (Signed)
Addended by: Adair Laundry on: 02/27/2022 04:02 PM ? ? Modules accepted: Orders ? ?

## 2022-02-28 MED ORDER — CLONAZEPAM 0.5 MG PO TABS: 0.5000 mg | ORAL_TABLET | Freq: Every day | ORAL | 0 refills | Status: DC | PRN

## 2022-02-28 NOTE — Telephone Encounter (Signed)
Refilled: 12/27/2021 ?Last OV: 12/14/2021 ?Next OV: not scheduled  ?

## 2022-02-28 NOTE — Telephone Encounter (Signed)
Pt need refill on KLONOPIN sent to [facility] ?

## 2022-02-28 NOTE — Addendum Note (Signed)
Addended by: Crecencio Mc on: 02/28/2022 01:16 PM ? ? Modules accepted: Orders ? ?

## 2022-02-28 NOTE — Telephone Encounter (Signed)
Pt calling in requesting for refill on medication (clonazePAM (KLONOPIN) 0.5 MG tablet)... Pt requesting callback...  ?

## 2022-02-28 NOTE — Telephone Encounter (Signed)
Refill for 30 days only.  OFFICE VISIT NEEDED prior to any more refills because she is taking narcotics prescribed by Dr Izora Ribas ?  ?

## 2022-03-08 DIAGNOSIS — M5416 Radiculopathy, lumbar region: Secondary | ICD-10-CM | POA: Diagnosis not present

## 2022-03-22 ENCOUNTER — Other Ambulatory Visit: Payer: Self-pay | Admitting: Neurosurgery

## 2022-03-22 DIAGNOSIS — Z0389 Encounter for observation for other suspected diseases and conditions ruled out: Secondary | ICD-10-CM | POA: Diagnosis not present

## 2022-03-22 DIAGNOSIS — Z01818 Encounter for other preprocedural examination: Secondary | ICD-10-CM

## 2022-03-24 IMAGING — CT CT ENTEROGRAPHY (ABD-PELV W/ CM)
2 of 6 series · 14 of 46 positions shown, 16 images · IV contrast (omnipaque)
Comparison: CT January 21, 2013

CLINICAL DATA: Severe bloating after eating with constipation and
diarrhea for 1 month.

EXAM:
CT ABDOMEN AND PELVIS WITH CONTRAST (ENTEROGRAPHY)
TECHNIQUE: Multidetector CT of the abdomen and pelvis during bolus
administration of intravenous contrast. Negative oral contrast was
given.
CONTRAST:  75mL OMNIPAQUE IOHEXOL 300 MG/ML  SOLN

[Series 4: thins enterography 2.00 · axial · 0.61mm/px · z∈[-1484,-1100]mm · 11 of 218 slices shown, 13 images]
[im 13/218  soft-tissue]
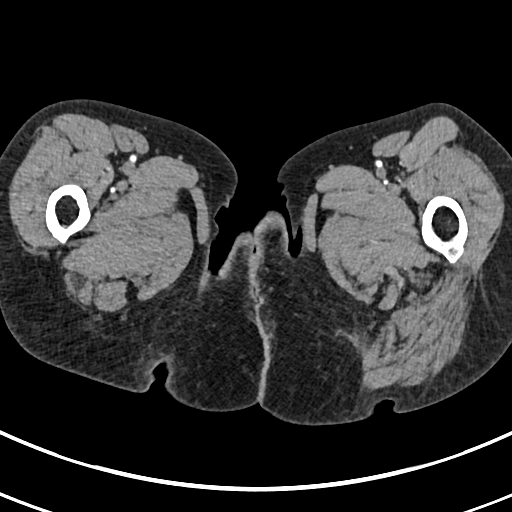
[im 13/218  bone]
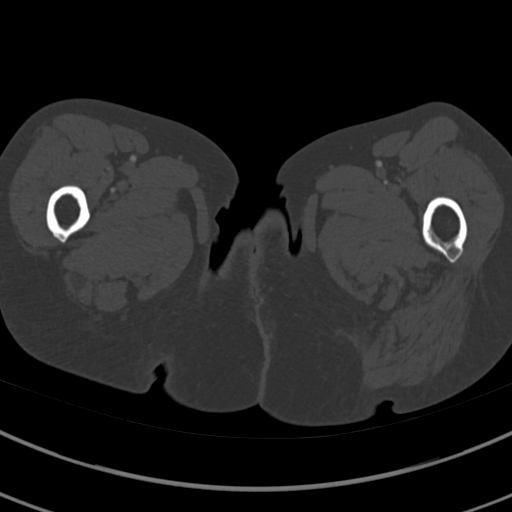
[im 37/218  soft-tissue]
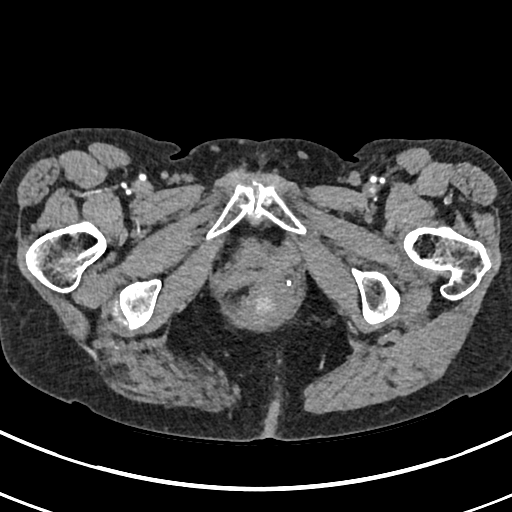
[im 49/218  soft-tissue]
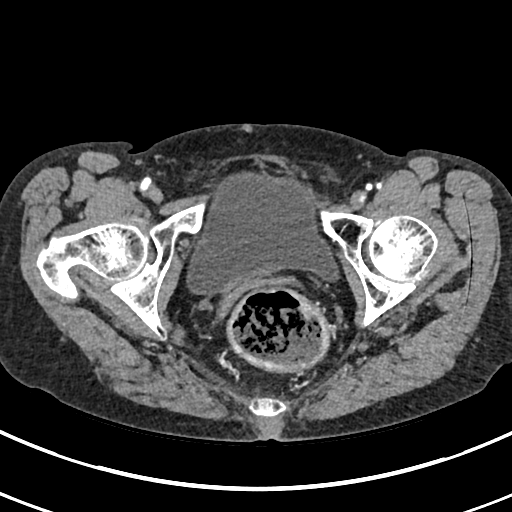
[im 73/218  soft-tissue]
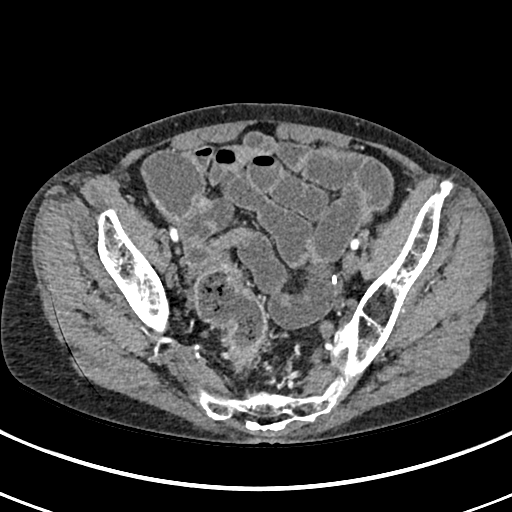
[im 85/218  soft-tissue]
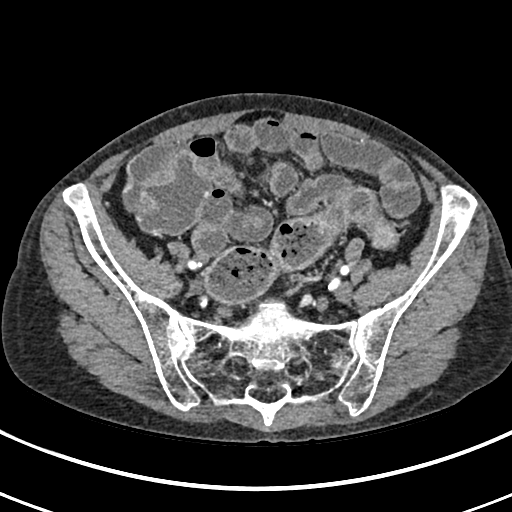
[im 109/218  soft-tissue]
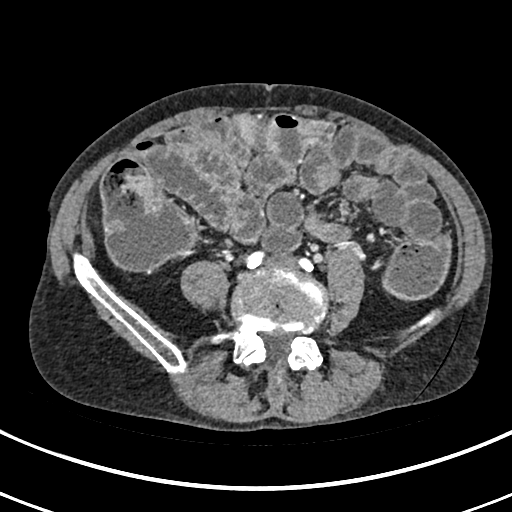
[im 133/218  soft-tissue]
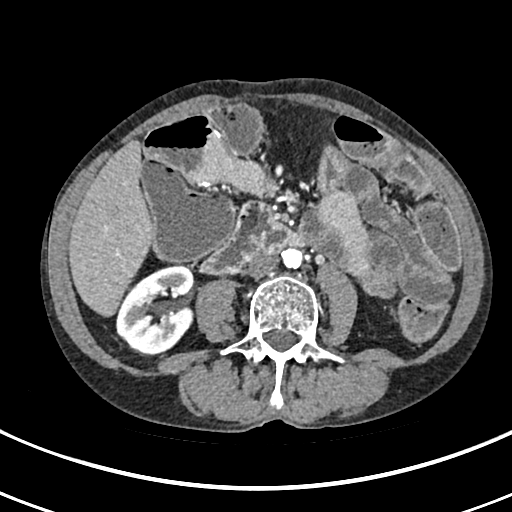
[im 145/218  soft-tissue]
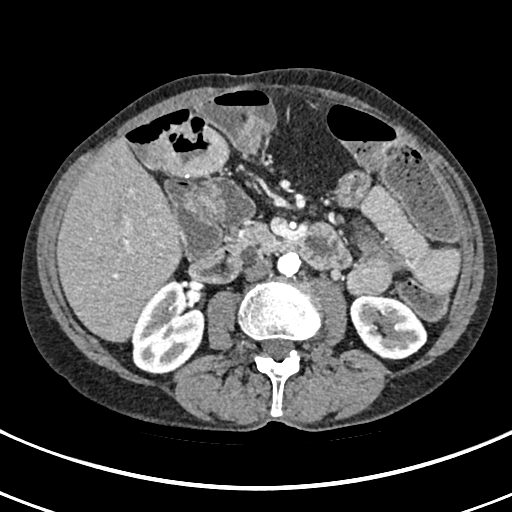
[im 169/218  soft-tissue]
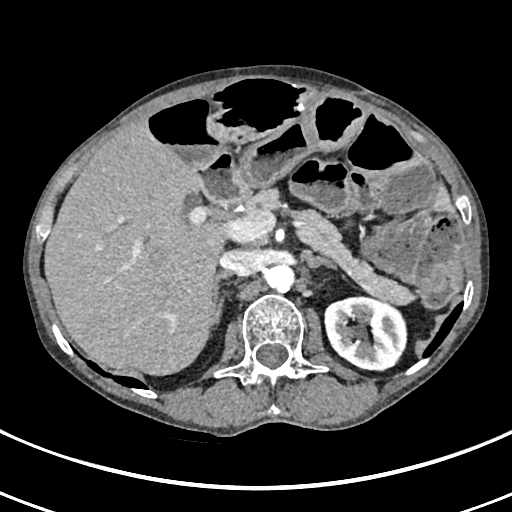
[im 169/218  bone]
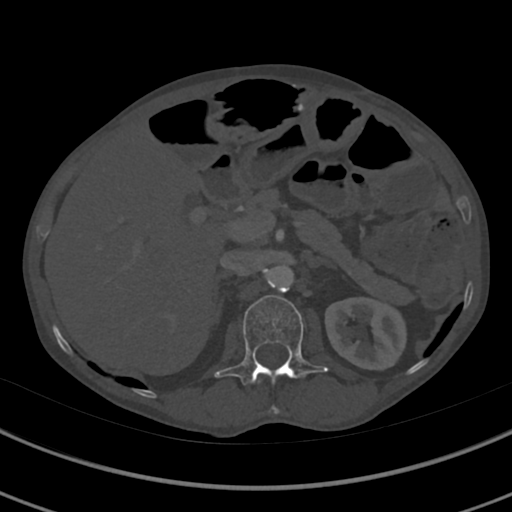
[im 181/218  soft-tissue]
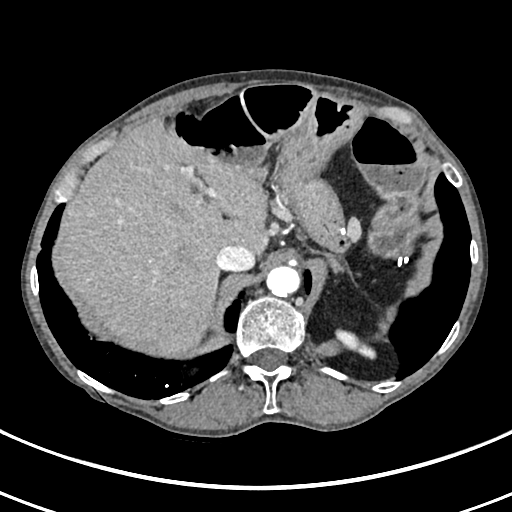
[im 205/218  soft-tissue]
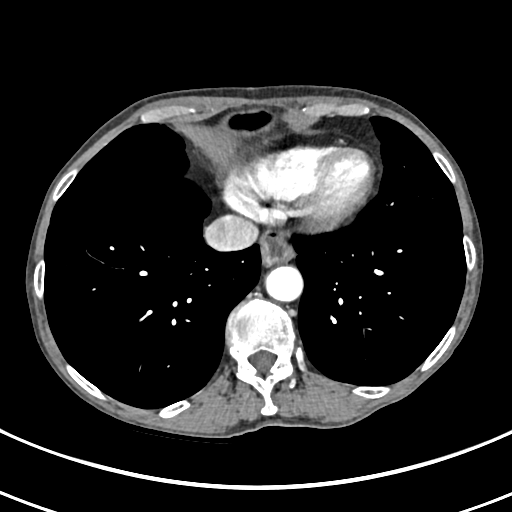

[Series 5: cor enterography 2.00 cor · coronal · 0.61mm/px · 3 of 120 slices shown]
[im 40/120  soft-tissue]
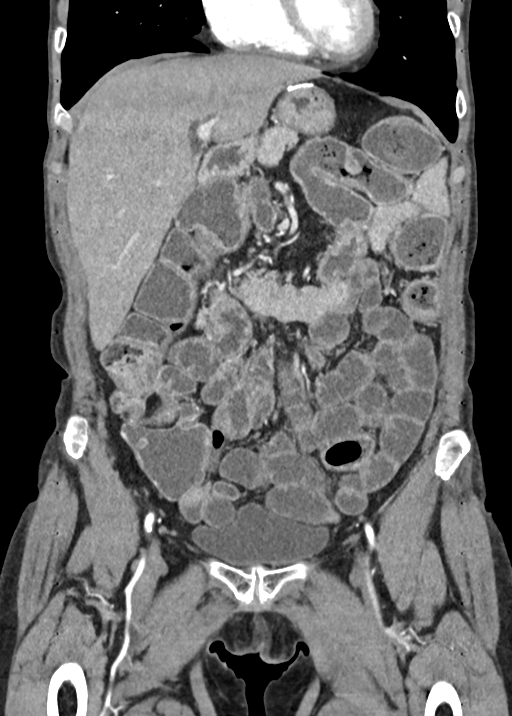
[im 53/120  soft-tissue]
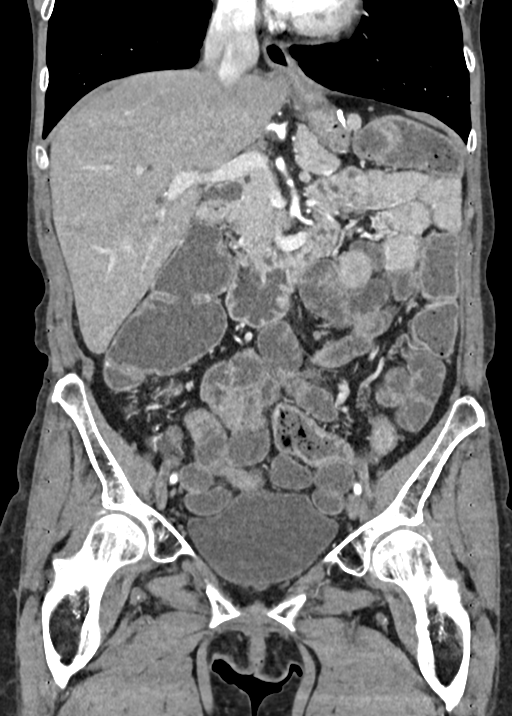
[im 67/120  soft-tissue]
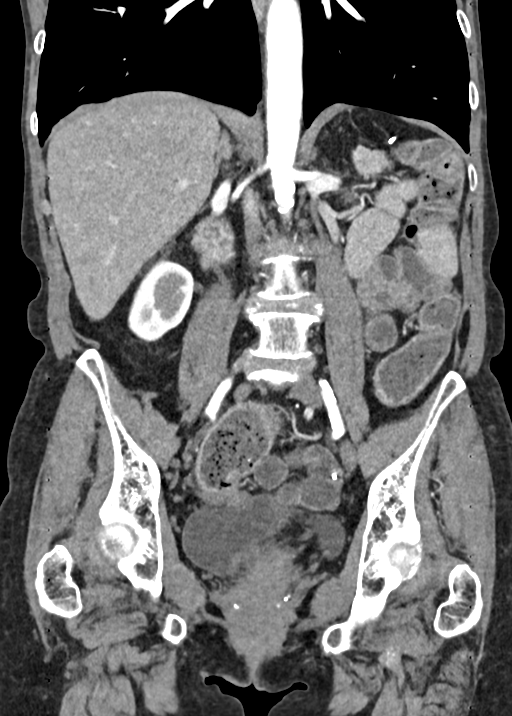

[14 of 46 positions shown; findings below may reference images not displayed]

FINDINGS: Lower chest:  Lung bases are clear.  No effusion.  No consolidation.

Hepatobiliary: Liver without focal lesion. Patent portal vein. Post
cholecystectomy with mild dilation of the common bile duct. Biliary
duct dilation is less than in 3923 perhaps slightly more when
compared to the study of 4609, common bile duct measuring
approximately 8-9 mm greatest caliber. Intrahepatic biliary tree in
the LEFT lobe with minimal dilation grossly unchanged compared to
previous imaging.

Pancreas: No pancreatic ductal dilation or sign of inflammation.

Spleen: Spleen is surgically absent.

Adrenals/Urinary Tract: Adrenal glands are normal. Symmetric renal
enhancement. No sign of hydronephrosis. Urinary bladder under
distended grossly unremarkable.

Stomach/Bowel: Post partial gastrectomy and splenectomy.

Mildly patulous distal esophagus, partially imaged.

Distension of small bowel loops with ingested contrast is intended
but with more profound dilation of a small bowel anastomosis in the
upper abdomen. Dilated at the anastomotic level but without
perienteric stranding. In bound in out bound bowel loops are normal
in caliber. No perienteric stranding. No sign of abscess or fistula.

Areas of alternating dilation and mild narrowing in the colon
without dilation to suggest obstruction in the colon. Mural
stratification of both rectum and descending colon best seen on
image 127 of series 4 and tracking throughout the rectum.

Vascular/Lymphatic: Calcified and noncalcified atheromatous plaque
in the abdominal aorta. Patent abdominal vasculature. There is no
gastrohepatic or hepatoduodenal ligament lymphadenopathy. No
retroperitoneal or mesenteric lymphadenopathy.

No pelvic sidewall lymphadenopathy. Diminutive RIGHT iliac venous
system may relate to prior DVT. Venous structures not well assessed.

Reproductive: Post hysterectomy.

Other: Postoperative changes in the RIGHT gluteal region tracking
towards the perineum may relate to prior fistula repair showing no
change from previous imaging and without surrounding stranding or
abscess in this location.

Musculoskeletal: No acute bone finding. No destructive bone process.
Spinal degenerative changes and osteopenia.
IMPRESSION: 1. Findings of potential proctocolitis in this patient with Crohn's
disease with rectal and distal colonic mural stratification and mild
mucosal hyperenhancement.
2. Postoperative changes in the RIGHT gluteal region likely related
to prior perianal fistula repair, correlate clinically.
3. Persistent biliary duct distension, alternating in severity.
Correlate with any laboratory evidence of biliary dysfunction.
Current caliber is less than 1 cm, within the normal range post
cholecystectomy.
4. No overt signs of enteritis.
5. Post partial gastrectomy and splenectomy.
6. No sign of bowel obstruction but with distended loop of bowel
adjacent to anastomosis likely related to anastomotic changes with
similar appearance to prior imaging studies.
7. RIGHT iliac venous system with diminutive appearance may relate
to prior DVT. Venous structures not well assessed.
8. Aortic atherosclerosis.

Aortic Atherosclerosis ([id number]).  The the

## 2022-03-26 ENCOUNTER — Other Ambulatory Visit: Payer: Self-pay | Admitting: Internal Medicine

## 2022-03-26 NOTE — Telephone Encounter (Signed)
Refilled: 02/28/2022 ?Last OV: 06/08/2021 ?Next OV: 03/28/2022 ?

## 2022-03-27 ENCOUNTER — Other Ambulatory Visit: Payer: Self-pay | Admitting: Internal Medicine

## 2022-03-27 ENCOUNTER — Telehealth: Payer: Self-pay | Admitting: Internal Medicine

## 2022-03-27 ENCOUNTER — Encounter: Payer: Self-pay | Admitting: Neurosurgery

## 2022-03-27 ENCOUNTER — Encounter
Admission: RE | Admit: 2022-03-27 | Discharge: 2022-03-27 | Disposition: A | Discharge status: Home / Self Care | Payer: Medicare Other | Source: Ambulatory Visit | Attending: Neurosurgery | Admitting: Neurosurgery

## 2022-03-27 DIAGNOSIS — D751 Secondary polycythemia: Secondary | ICD-10-CM

## 2022-03-27 DIAGNOSIS — I2584 Coronary atherosclerosis due to calcified coronary lesion: Secondary | ICD-10-CM | POA: Diagnosis not present

## 2022-03-27 DIAGNOSIS — I2510 Atherosclerotic heart disease of native coronary artery without angina pectoris: Secondary | ICD-10-CM | POA: Diagnosis not present

## 2022-03-27 DIAGNOSIS — Z01818 Encounter for other preprocedural examination: Secondary | ICD-10-CM | POA: Insufficient documentation

## 2022-03-27 DIAGNOSIS — Z01812 Encounter for preprocedural laboratory examination: Secondary | ICD-10-CM

## 2022-03-27 DIAGNOSIS — Z01810 Encounter for preprocedural cardiovascular examination: Secondary | ICD-10-CM | POA: Diagnosis not present

## 2022-03-27 HISTORY — DX: Spinal stenosis, lumbar region without neurogenic claudication: M48.061

## 2022-03-27 HISTORY — DX: Unspecified cataract: H26.9

## 2022-03-27 HISTORY — DX: Rheumatic tricuspid insufficiency: I07.1

## 2022-03-27 HISTORY — DX: Vitamin D deficiency, unspecified: E55.9

## 2022-03-27 LAB — CBC
HCT: 39.8 % (ref 36.0–46.0)
Hemoglobin: 13.1 g/dL (ref 12.0–15.0)
MCH: 33 pg (ref 26.0–34.0)
MCHC: 32.9 g/dL (ref 30.0–36.0)
MCV: 100.3 fL — ABNORMAL HIGH (ref 80.0–100.0)
Platelets: 254 10*3/uL (ref 150–400)
RBC: 3.97 MIL/uL (ref 3.87–5.11)
RDW: 14.3 % (ref 11.5–15.5)
WBC: 9.7 10*3/uL (ref 4.0–10.5)
nRBC: 0 % (ref 0.0–0.2)

## 2022-03-27 LAB — URINALYSIS, ROUTINE W REFLEX MICROSCOPIC
Bacteria, UA: NONE SEEN
Bilirubin Urine: NEGATIVE
Glucose, UA: NEGATIVE mg/dL
Ketones, ur: NEGATIVE mg/dL
Leukocytes,Ua: NEGATIVE
Nitrite: NEGATIVE
Protein, ur: NEGATIVE mg/dL
Specific Gravity, Urine: 1.009 (ref 1.005–1.030)
Squamous Epithelial / HPF: NONE SEEN (ref 0–5)
pH: 5 (ref 5.0–8.0)

## 2022-03-27 LAB — BASIC METABOLIC PANEL
Anion gap: 6 (ref 5–15)
BUN: 13 mg/dL (ref 8–23)
CO2: 27 mmol/L (ref 22–32)
Calcium: 9.5 mg/dL (ref 8.9–10.3)
Chloride: 107 mmol/L (ref 98–111)
Creatinine, Ser: 0.68 mg/dL (ref 0.44–1.00)
GFR, Estimated: >60 mL/min (ref >60)
Glucose, Bld: 139 mg/dL — ABNORMAL HIGH (ref 70–99)
Potassium: 3.9 mmol/L (ref 3.5–5.1)
Sodium: 140 mmol/L (ref 135–145)

## 2022-03-27 LAB — TYPE AND SCREEN
ABO/RH(D): O POS
Antibody Screen: NEGATIVE

## 2022-03-27 LAB — SURGICAL PCR SCREEN
MRSA, PCR: NEGATIVE
Staphylococcus aureus: NEGATIVE

## 2022-03-27 MED ORDER — NALOXONE HCL 4 MG/0.1ML NA LIQD: 1.0000 | Freq: Once | NASAL | 0 refills | Status: AC

## 2022-03-27 NOTE — Telephone Encounter (Signed)
Pt is aware that medication has been refilled.  °

## 2022-03-27 NOTE — Telephone Encounter (Signed)
Pt is aware and has an appt scheduled for tomorrow at 11am.  ?

## 2022-03-27 NOTE — Telephone Encounter (Signed)
Pt called in requesting refill on medication (clonazePAM (KLONOPIN) 0.5 MG tablet)... Pt requesting callback  ?

## 2022-03-27 NOTE — Patient Instructions (Addendum)
Your procedure is scheduled on: Monday, May 22 ?Report to the Registration Desk on the 1st floor of the [facility]. ?To find out your arrival time, please call [phone] between 1PM - 3PM on: Friday, May 19 ?If your arrival time is 6:00 am, do not arrive prior to that time as the [facility] entrance doors do not open until 6:00 am. ? ?REMEMBER: ?Instructions that are not followed completely may result in serious medical risk, up to and including death; or upon the discretion of your surgeon and anesthesiologist your surgery may need to be rescheduled. ? ?Do not eat food after midnight the night before surgery.  ?No gum chewing, lozengers or hard candies. ? ?You may however, drink CLEAR liquids up to 2 hours before you are scheduled to arrive for your surgery. Do not drink anything within 2 hours of your scheduled arrival time. ? ?Clear liquids include: ?- water  ?- apple juice without pulp ?- gatorade (not RED colors) ?- black coffee or tea (Do NOT add milk or creamers to the coffee or tea) ?Do NOT drink anything that is not on this list. ? ?TAKE THESE MEDICATIONS THE MORNING OF SURGERY WITH A SIP OF WATER: ? ?Clonazepam if needed for anxiety ?Omeprazole - (take one the night before and one on the morning of surgery - helps to prevent nausea after surgery.) ? ?According to Dr. Rhea Bleacher order: ?COUMADIN (warfarin) stop 7 days prior to surgery. Last day to take is Sunday May 14. Do not resume until 14 days after surgery. May start taking again June 5. ? ?One week prior to surgery: starting May 15 ?Stop Anti-inflammatories (NSAIDS) such as Advil, Aleve, Ibuprofen, Motrin, Naproxen, Naprosyn and Aspirin based products such as Excedrin, Goodys Powder, BC Powder. ?Stop ANY OVER THE COUNTER supplements until after surgery. ?You may however, continue to take Tylenol if needed for pain up until the day of surgery. ? ?No Alcohol for 24 hours before or after surgery. ? ?No Smoking including e-cigarettes for 24  hours prior to surgery.  ?No chewable tobacco products for at least 6 hours prior to surgery.  ?No nicotine patches on the day of surgery. ? ?Do not use any "recreational" drugs for at least a week prior to your surgery.  ?Please be advised that the combination of cocaine and anesthesia may have negative outcomes, up to and including death. ?If you test positive for cocaine, your surgery will be cancelled. ? ?On the morning of surgery brush your teeth with toothpaste and water, you may rinse your mouth with mouthwash if you wish. ?Do not swallow any toothpaste or mouthwash. ? ?Use CHG Soap as directed on instruction sheet. ? ?Do not wear jewelry, make-up, hairpins, clips or nail polish. ? ?Do not wear lotions, powders, or perfumes.  ? ?Do not shave body from the neck down 48 hours prior to surgery just in case you cut yourself which could leave a site for infection.  ?Also, freshly shaved skin may become irritated if using the CHG soap. ? ?Do not bring valuables to the hospital. [facility] is not responsible for any missing/lost belongings or valuables.  ? ?Notify your doctor if there is any change in your medical condition (cold, fever, infection). ? ?Wear comfortable clothing (specific to your surgery type) to the hospital. ? ?After surgery, you can help prevent lung complications by doing breathing exercises.  ?Take deep breaths and cough every 1-2 hours. Your doctor may order a device called an Incentive Spirometer to help you  take deep breaths. ? ?If you are being admitted to the hospital overnight, leave your suitcase in the car. ?After surgery it may be brought to your room. ? ?If you are being discharged the day of surgery, you will not be allowed to drive home. ?You will need a responsible adult (18 years or older) to drive you home and stay with you that night.  ? ?If you are taking public transportation, you will need to have a responsible adult (18 years or older) with you. ?Please confirm with your  physician that it is acceptable to use public transportation.  ? ?Please call the [facility] at [phone] if you have any questions about these instructions. ? ?Surgery Visitation Policy: ? ?Patients undergoing a surgery or procedure may have two family members or support persons with them as long as the person is not COVID-19 positive or experiencing its symptoms.  ? ?Inpatient Visitation:   ? ?Visiting hours are 7 a.m. to 8 p.m. ?Up to four visitors are allowed at one time in a patient room, including children. The visitors may rotate out with other people during the day. One designated support person (adult) may remain overnight.  ? ? ? ?Preparing for Surgery with CHLORHEXIDINE GLUCONATE (CHG) Soap ? ?Before surgery, you can play an important role by reducing the number of germs on your skin.  ?CHG (Chlorhexidine gluconate) soap is an antiseptic cleanser which kills germs and bonds with the skin to continue killing germs even after washing. ? ?Please do not use if you have an allergy to CHG or antibacterial soaps. If your skin becomes reddened/irritated stop using the CHG. ? ?1. Shower the NIGHT BEFORE SURGERY and the MORNING OF SURGERY with CHG soap. ? ?2. If you choose to wash your hair, wash your hair first as usual with your normal shampoo. ? ?3. After shampooing, rinse your hair and body thoroughly to remove the shampoo. ? ?4. Use CHG as you would any other liquid soap. You can apply CHG directly to the skin and wash gently with a scrungie or a clean washcloth. ? ?5. Apply the CHG soap to your body only from the neck down. Do not use on open wounds or open sores. Avoid contact with your eyes, ears, mouth, and genitals (private parts). Wash face and genitals (private parts) with your normal soap. ? ?6. Wash thoroughly, paying special attention to the area where your surgery will be performed. ? ?7. Thoroughly rinse your body with warm water. ? ?8. Do not shower/wash with your normal  soap after using and rinsing off the CHG soap. ? ?9. Pat yourself dry with a clean towel. ? ?10. Wear clean pajamas to bed the night before surgery. ? ?12. Place clean sheets on your bed the night of your first shower and do not sleep with pets. ? ?13. Shower again with the CHG soap on the day of surgery prior to arriving at the hospital. ? ?14. Do not apply any deodorants/lotions/powders. ? ?15. Please wear clean clothes to the hospital.  ? ? ?How to Use an Incentive Spirometer ?An incentive spirometer is a tool that measures how well you are filling your lungs with each breath. Learning to take long, deep breaths using this tool can help you keep your lungs clear and active. This may help to reverse or lessen your chance of developing breathing (pulmonary) problems, especially infection. You may be asked to use a spirometer: ?After a surgery. ?If you have a lung problem or  a history of smoking. ?After a long period of time when you have been unable to move or be active. ?If the spirometer includes an indicator to show the highest number that you have reached, your health care provider or respiratory therapist will help you set a goal. Keep a log of your progress as told by your health care provider. ?What are the risks? ?Breathing too quickly may cause dizziness or cause you to pass out. Take your time so you do not get dizzy or light-headed. ?If you are in pain, you may need to take pain medicine before doing incentive spirometry. It is harder to take a deep breath if you are having pain. ?How to use your incentive spirometer ? ?Sit up on the edge of your bed or on a chair. ?Hold the incentive spirometer so that it is in an upright position. ?Before you use the spirometer, breathe out normally. ?Place the mouthpiece in your mouth. Make sure your lips are closed tightly around it. ?Breathe in slowly and as deeply as you can through your mouth, causing the piston or the ball to rise toward the top of the  chamber. ?Hold your breath for 3-5 seconds, or for as long as possible. ?If the spirometer includes a coach indicator, use this to guide you in breathing. Slow down your breathing if the indicator goes above the marked a

## 2022-03-28 ENCOUNTER — Ambulatory Visit (INDEPENDENT_AMBULATORY_CARE_PROVIDER_SITE_OTHER): Payer: Medicare Other | Admitting: Internal Medicine

## 2022-03-28 ENCOUNTER — Encounter: Payer: Self-pay | Admitting: Internal Medicine

## 2022-03-28 VITALS — BP 114/60 | HR 92 | Temp 97.9°F | Ht 63.0 in | Wt 94.4 lb

## 2022-03-28 DIAGNOSIS — F411 Generalized anxiety disorder: Secondary | ICD-10-CM

## 2022-03-28 DIAGNOSIS — I700 Atherosclerosis of aorta: Secondary | ICD-10-CM | POA: Diagnosis not present

## 2022-03-28 DIAGNOSIS — I82452 Acute embolism and thrombosis of left peroneal vein: Secondary | ICD-10-CM

## 2022-03-28 DIAGNOSIS — R482 Apraxia: Secondary | ICD-10-CM

## 2022-03-28 DIAGNOSIS — Z86718 Personal history of other venous thrombosis and embolism: Secondary | ICD-10-CM | POA: Diagnosis not present

## 2022-03-28 DIAGNOSIS — I679 Cerebrovascular disease, unspecified: Secondary | ICD-10-CM | POA: Diagnosis not present

## 2022-03-28 NOTE — Assessment & Plan Note (Deleted)
She has had 2 postoperative DVTS, once in 2018 ,which required IVC filter which was removed,   followed by the DVT in July 2022.  She does not require bridging during suspension of coumadin per hematology but will likely  require lifelong anticoag  . She has not had a formal hematology evaluation. I have recommended this

## 2022-03-28 NOTE — Progress Notes (Signed)
Subjective:  Patient ID: Tamara [name], female    DOB: October 10, 1946  Age: 76 y.o. MRN: [id number]  CC: The primary encounter diagnosis was History of DVT in adulthood. Diagnoses of Acute deep vein thrombosis (DVT) of left peroneal vein (HCC), History of DVT (deep vein thrombosis), Aortic atherosclerosis ([place]), Cerebrovascular disease, Apraxia, and Generalized anxiety disorder were also pertinent to this visit.   HPI Tamara [name] presents for follow up on multiple issues.  Chief Complaint  Patient presents with   Follow-up    6 month follow up     1) GAD:  she has been taking sertraline for years and Pamelor for insomnia .  She uses clonazepam once daily for years at bedtime to help her sleep. For the last several months has been taking opioids for spinal stenosis, prescribed by surgeon.  Refill history confirmed via [place] Controlled Substance databas, accessed by me today.. Narcan was sent to [facility] but not filled for patient for unclear reasons   2) patient reports that she had an episode of apraxia yesterday that lasted for about 15 minutes.  She states that she suddenly could not figure out how to use her remote TV control or her IPAD . The episode was witnessed by a neighbor and did not include any staring episodes, slurred speech,  or weakness.Marland Kitchen   3)  History of DVT x 2 :   she is scheduled for surgery on Monday May 22.  Her last dose of warfarin was May 11 in preparation for the surgery.  There has been no bridging with Lovenox per guidelines    4) lumbar spinal stenosis:    surgery is planned for Monday May 22   5) tobacco abuse:  quit smoking 30 days ago in prep for surgery   Outpatient Medications Prior to Visit  Medication Sig Dispense Refill   clonazePAM (KLONOPIN) 0.5 MG tablet TAKE 1 TABLET BY MOUTH ONCE DAILY AS NEEDED FOR ANXIETY 30 tablet 0   cyanocobalamin (,VITAMIN B-12,) 1000 MCG/ML injection Inject 1 mL (1,000 mcg total) into the muscle every 30 (thirty) days.  10 mL 2   nortriptyline (PAMELOR) 25 MG capsule Take 1 capsule (25 mg total) by mouth at bedtime. 90 capsule 1   omeprazole (PRILOSEC) 40 MG capsule TAKE 1 CAPSULE BY MOUTH TWICE DAILY (Patient taking differently: Take 40 mg by mouth at bedtime.) 180 capsule 1   rosuvastatin (CRESTOR) 10 MG tablet TAKE 1 TABLET BY MOUTH AT BEDTIME 90 tablet 1   sertraline (ZOLOFT) 100 MG tablet TAKE 1 TABLET BY MOUTH ONCE DAILY (Patient taking differently: Take 100 mg by mouth at bedtime.) 90 tablet 1   Syringe/Needle, Disp, (SYRINGE 3CC/25GX1") 25G X 1" 3 ML MISC Use for b12 injections 50 each 0   traZODone (DESYREL) 50 MG tablet Take 2 tablets (100 mg total) by mouth at bedtime. 180 tablet 1   warfarin (COUMADIN) 1 MG tablet TAKE 2 TABLET BY MOUTH ONCE DAILY WITH THE 5MG FOR 7MG TOTAL DAILY (Patient taking differently: Take 2 mg by mouth See admin instructions. Take with 5 mg every evening to = 7 mg) 90 tablet 1   warfarin (COUMADIN) 5 MG tablet Take 1 tablet (5 mg total) by mouth at bedtime. Take with 2 mg to equal 7 mg every night 90 tablet 1   No facility-administered medications prior to visit.    Review of Systems;  Patient denies headache, fevers, malaise, unintentional weight loss, skin rash, eye pain, sinus congestion and  sinus pain, sore throat, dysphagia,  hemoptysis , cough, dyspnea, wheezing, chest pain, palpitations, orthopnea, edema, abdominal pain, nausea, melena, diarrhea, constipation, flank pain, dysuria, hematuria, urinary  Frequency, nocturia, numbness, tingling, seizures,  Focal weakness, Loss of consciousness,  Tremor, insomnia, depression, anxiety, and suicidal ideation.      Objective:  BP 114/60 (BP Location: Left Arm, Patient Position: Sitting, Cuff Size: Normal)   Pulse 92   Temp 97.9 F (36.6 C) (Oral)   Ht 5' 3"  (1.6 m)   Wt 94 lb 6.4 oz (42.8 kg)   SpO2 98%   BMI 16.72 kg/m   BP Readings from Last 3 Encounters:  03/28/22 114/60  03/27/22 125/67  12/14/21 106/70     Wt Readings from Last 3 Encounters:  03/28/22 94 lb 6.4 oz (42.8 kg)  03/27/22 96 lb (43.5 kg)  02/21/22 93 lb (42.2 kg)    General appearance: alert, cooperative and appears stated age Ears: normal TM's and external ear canals both ears Throat: lips, mucosa, and tongue normal; teeth and gums normal Neck: no adenopathy, no carotid bruit, supple, symmetrical, trachea midline and thyroid not enlarged, symmetric, no tenderness/mass/nodules Back: symmetric, no curvature. ROM normal. No CVA tenderness. Lungs: clear to auscultation bilaterally Heart: regular rate and rhythm, S1, S2 normal, no murmur, click, rub or gallop Abdomen: soft, non-tender; bowel sounds normal; no masses,  no organomegaly Pulses: 2+ and symmetric Skin: Skin color, texture, turgor normal. No rashes or lesions Lymph nodes: Cervical, supraclavicular, and axillary nodes normal.  Lab Results  Component Value Date   HGBA1C 6.1 09/21/2020    Lab Results  Component Value Date   CREATININE 0.68 03/27/2022   CREATININE 0.68 12/06/2021   CREATININE 0.60 11/02/2021    Lab Results  Component Value Date   WBC 9.7 03/27/2022   HGB 13.1 03/27/2022   HCT 39.8 03/27/2022   PLT 254 03/27/2022   GLUCOSE 139 (H) 03/27/2022   CHOL 188 01/26/2020   TRIG 182.0 (H) 01/26/2020   HDL 58.90 01/26/2020   LDLDIRECT 121.1 01/26/2014   LDLCALC 92 01/26/2020   ALT 26 12/06/2021   AST 32 12/06/2021   NA 140 03/27/2022   K 3.9 03/27/2022   CL 107 03/27/2022   CREATININE 0.68 03/27/2022   BUN 13 03/27/2022   CO2 27 03/27/2022   TSH 1.74 06/11/2017   INR 2.2 (H) 12/14/2021   HGBA1C 6.1 09/21/2020    No results found.  Assessment & Plan:   Problem List Items Addressed This Visit     RESOLVED: Acute deep vein thrombosis (DVT) of left peroneal vein (HCC)   History of DVT (deep vein thrombosis)    She has had 2 postoperative DVTS, once in 2018 ,which required IVC filter which was removed,   followed by the DVT in July  2022.  She does not require bridging during suspension of coumadin per hematology but will likely  require lifelong anticoag  . She has not had a formal hematology evaluation. I have recommended this         Aortic atherosclerosis ([place])    Reviewed her need for statin use due to  findings of prior CT scan today..  Patient is tolerating statin therapy with 10 mg crestor once daily  And LFTS are normal based on review of labs via Epic portal from [facility]    Her episode was confined to using electronic device        Cerebrovascular disease  Diffuse, noted on prior MRI,  With transient dizziness occurring with tobacco use .  Tobacco cessation has been achieved for the pupose of undergoing spinal surgery .  enouraged to refrain from starting again post operatively , continue  asa and start crestor.        Generalized anxiety disorder    She has been taking sertraline for years,  Now taking 100 mg dose along with  An dPamelor for insomnia. .  Narcan use outlined given concurrent use of opioids       Other Visit Diagnoses     History of DVT in adulthood    -  Primary   Relevant Orders   Ambulatory referral to Hematology / Oncology       I spent a total of  27mnutes with this patient in a face to face visit on the date of this encounter reviewing the last office visit with me 6 months ago,  her most recent hospitalization for DVT,  her lumbar spine MRI and chest CT,  discussion with hematology Dr RJanese Banks and post visit ordering of testing , referral to hematology and therapeutics.    Follow-up: No follow-ups on file.   TCrecencio Mc MD

## 2022-03-28 NOTE — Assessment & Plan Note (Addendum)
She has had 2 postoperative DVTS, once in 2018 ,which required IVC filter which has been subsequently removed,   followed by the DVT in July 2022.  She does not require bridging during suspension of coumadin per hematology but will likely  require lifelong anticoag  . She has not had a formal hematology evaluation. I have recommended this

## 2022-03-29 DIAGNOSIS — M4316 Spondylolisthesis, lumbar region: Secondary | ICD-10-CM | POA: Diagnosis not present

## 2022-03-31 DIAGNOSIS — R482 Apraxia: Secondary | ICD-10-CM | POA: Insufficient documentation

## 2022-03-31 NOTE — Assessment & Plan Note (Signed)
She has been taking sertraline for years,  Now taking 100 mg dose along with  An dPamelor for insomnia. .  Narcan use outlined given concurrent use of opioids

## 2022-03-31 NOTE — Assessment & Plan Note (Signed)
Reviewed her need for statin use due to  findings of prior CT scan today..  Patient is tolerating statin therapy with 10 mg crestor once daily  And LFTS are normal based on review of labs via Epic portal from [facility]

## 2022-03-31 NOTE — Assessment & Plan Note (Signed)
Her episode was confined to using electronic device

## 2022-03-31 NOTE — Assessment & Plan Note (Signed)
Diffuse, noted on prior MRI,  With transient dizziness occurring with tobacco use .  Tobacco cessation has been achieved for the pupose of undergoing spinal surgery .  enouraged to refrain from starting again post operatively , continue  asa and start crestor.

## 2022-04-01 ENCOUNTER — Encounter: Payer: Self-pay | Admitting: Neurosurgery

## 2022-04-01 ENCOUNTER — Other Ambulatory Visit: Payer: Self-pay

## 2022-04-01 ENCOUNTER — Encounter
Admission: RE | Disposition: A | Discharge status: Home Health | Payer: Self-pay | Source: Home / Self Care | Attending: Neurosurgery

## 2022-04-01 ENCOUNTER — Inpatient Hospital Stay
Admission: RE | Admit: 2022-04-01 | Discharge: 2022-04-03 | DRG: 455 | Disposition: A | Discharge status: Home Health | Payer: Medicare Other | Attending: Neurosurgery | Admitting: Neurosurgery

## 2022-04-01 ENCOUNTER — Inpatient Hospital Stay: Payer: Medicare Other

## 2022-04-01 ENCOUNTER — Inpatient Hospital Stay: Payer: Medicare Other | Admitting: Urgent Care

## 2022-04-01 DIAGNOSIS — M4316 Spondylolisthesis, lumbar region: Secondary | ICD-10-CM | POA: Diagnosis not present

## 2022-04-01 DIAGNOSIS — M4310 Spondylolisthesis, site unspecified: Secondary | ICD-10-CM | POA: Diagnosis not present

## 2022-04-01 DIAGNOSIS — M4326 Fusion of spine, lumbar region: Secondary | ICD-10-CM | POA: Diagnosis not present

## 2022-04-01 DIAGNOSIS — K5900 Constipation, unspecified: Secondary | ICD-10-CM | POA: Diagnosis not present

## 2022-04-01 DIAGNOSIS — Z882 Allergy status to sulfonamides status: Secondary | ICD-10-CM | POA: Diagnosis not present

## 2022-04-01 DIAGNOSIS — Z79899 Other long term (current) drug therapy: Secondary | ICD-10-CM

## 2022-04-01 DIAGNOSIS — Z888 Allergy status to other drugs, medicaments and biological substances status: Secondary | ICD-10-CM

## 2022-04-01 DIAGNOSIS — Z981 Arthrodesis status: Secondary | ICD-10-CM

## 2022-04-01 DIAGNOSIS — Z7901 Long term (current) use of anticoagulants: Secondary | ICD-10-CM

## 2022-04-01 DIAGNOSIS — Z01812 Encounter for preprocedural laboratory examination: Principal | ICD-10-CM

## 2022-04-01 DIAGNOSIS — M5116 Intervertebral disc disorders with radiculopathy, lumbar region: Principal | ICD-10-CM | POA: Diagnosis present

## 2022-04-01 DIAGNOSIS — M5416 Radiculopathy, lumbar region: Secondary | ICD-10-CM | POA: Diagnosis not present

## 2022-04-01 DIAGNOSIS — G4733 Obstructive sleep apnea (adult) (pediatric): Secondary | ICD-10-CM | POA: Diagnosis not present

## 2022-04-01 DIAGNOSIS — M79604 Pain in right leg: Secondary | ICD-10-CM | POA: Diagnosis present

## 2022-04-01 DIAGNOSIS — I2510 Atherosclerotic heart disease of native coronary artery without angina pectoris: Secondary | ICD-10-CM

## 2022-04-01 DIAGNOSIS — D751 Secondary polycythemia: Secondary | ICD-10-CM

## 2022-04-01 HISTORY — DX: Anxiety disorder, unspecified: F41.9

## 2022-04-01 HISTORY — DX: Long term (current) use of anticoagulants: Z79.01

## 2022-04-01 HISTORY — PX: TRANSFORAMINAL LUMBAR INTERBODY FUSION (TLIF) WITH PEDICLE SCREW FIXATION 1 LEVEL: SHX6141

## 2022-04-01 HISTORY — DX: Acquired absence of spleen: Z90.81

## 2022-04-01 LAB — CBC
HCT: 36.9 % (ref 36.0–46.0)
Hemoglobin: 12.2 g/dL (ref 12.0–15.0)
MCH: 33.2 pg (ref 26.0–34.0)
MCHC: 33.1 g/dL (ref 30.0–36.0)
MCV: 100.5 fL — ABNORMAL HIGH (ref 80.0–100.0)
Platelets: 236 10*3/uL (ref 150–400)
RBC: 3.67 MIL/uL — ABNORMAL LOW (ref 3.87–5.11)
RDW: 13.8 % (ref 11.5–15.5)
WBC: 7.6 10*3/uL (ref 4.0–10.5)
nRBC: 0 % (ref 0.0–0.2)

## 2022-04-01 LAB — PROTIME-INR
INR: 1 (ref 0.8–1.2)
Prothrombin Time: 12.8 seconds (ref 11.4–15.2)

## 2022-04-01 LAB — CREATININE, SERUM
Creatinine, Ser: 0.75 mg/dL (ref 0.44–1.00)
GFR, Estimated: >60 mL/min (ref >60)

## 2022-04-01 SURGERY — TRANSFORAMINAL LUMBAR INTERBODY FUSION (TLIF) WITH PEDICLE SCREW FIXATION 1 LEVEL
Anesthesia: General | Site: Spine Lumbar

## 2022-04-01 MED ORDER — ROSUVASTATIN CALCIUM 10 MG PO TABS
10.0000 mg | ORAL_TABLET | Freq: Every day | ORAL | Status: DC
  Administered 2022-04-01 – 2022-04-02 (×2): 10 mg via ORAL
  Filled 2022-04-01 (×2): qty 1

## 2022-04-01 MED ORDER — CHLORHEXIDINE GLUCONATE 0.12 % MT SOLN
OROMUCOSAL | Status: AC
  Administered 2022-04-01: 15 mL via OROMUCOSAL
  Filled 2022-04-01: qty 15

## 2022-04-01 MED ORDER — VANCOMYCIN HCL IN DEXTROSE 1-5 GM/200ML-% IV SOLN: 1000.0000 mg | Freq: Once | INTRAVENOUS | Status: AC

## 2022-04-01 MED ORDER — LIDOCAINE HCL (CARDIAC) PF 100 MG/5ML IV SOSY
PREFILLED_SYRINGE | INTRAVENOUS | Status: DC | PRN
  Administered 2022-04-01: 50 mg via INTRAVENOUS

## 2022-04-01 MED ORDER — HYDROMORPHONE HCL 1 MG/ML IJ SOLN
INTRAMUSCULAR | Status: AC
  Administered 2022-04-01: 0.5 mg via INTRAVENOUS
  Filled 2022-04-01: qty 1

## 2022-04-01 MED ORDER — PANTOPRAZOLE SODIUM 40 MG PO TBEC
40.0000 mg | DELAYED_RELEASE_TABLET | Freq: Every day | ORAL | Status: DC
  Administered 2022-04-02 – 2022-04-03 (×2): 40 mg via ORAL
  Filled 2022-04-01 (×2): qty 1

## 2022-04-01 MED ORDER — FENTANYL CITRATE (PF) 100 MCG/2ML IJ SOLN
INTRAMUSCULAR | Status: AC
  Filled 2022-04-01: qty 2

## 2022-04-01 MED ORDER — SERTRALINE HCL 50 MG PO TABS
100.0000 mg | ORAL_TABLET | Freq: Every day | ORAL | Status: DC
  Administered 2022-04-01 – 2022-04-02 (×2): 100 mg via ORAL
  Filled 2022-04-01 (×2): qty 2

## 2022-04-01 MED ORDER — EPHEDRINE SULFATE (PRESSORS) 50 MG/ML IJ SOLN
INTRAMUSCULAR | Status: DC | PRN
Start: 2022-04-01 — End: 2022-04-01
  Administered 2022-04-01: 10 mg via INTRAVENOUS

## 2022-04-01 MED ORDER — SODIUM CHLORIDE 0.9% FLUSH
3.0000 mL | Freq: Two times a day (BID) | INTRAVENOUS | Status: DC
  Administered 2022-04-01 – 2022-04-03 (×4): 3 mL via INTRAVENOUS

## 2022-04-01 MED ORDER — HYDROMORPHONE HCL 1 MG/ML IJ SOLN
0.5000 mg | INTRAMUSCULAR | Status: DC | PRN
  Administered 2022-04-01: 0.5 mg via INTRAVENOUS

## 2022-04-01 MED ORDER — KETOROLAC TROMETHAMINE 15 MG/ML IJ SOLN
7.5000 mg | Freq: Four times a day (QID) | INTRAMUSCULAR | Status: AC
  Administered 2022-04-01 – 2022-04-02 (×4): 7.5 mg via INTRAVENOUS
  Filled 2022-04-01 (×4): qty 1

## 2022-04-01 MED ORDER — PROPOFOL 10 MG/ML IV BOLUS
INTRAVENOUS | Status: AC
  Filled 2022-04-01: qty 20

## 2022-04-01 MED ORDER — CEFAZOLIN SODIUM-DEXTROSE 2-4 GM/100ML-% IV SOLN
2.0000 g | Freq: Once | INTRAVENOUS | Status: AC
  Administered 2022-04-01: 1 g via INTRAVENOUS

## 2022-04-01 MED ORDER — CEFAZOLIN SODIUM 1 G IJ SOLR
INTRAMUSCULAR | Status: AC
  Filled 2022-04-01: qty 10

## 2022-04-01 MED ORDER — SODIUM CHLORIDE 0.9 % IV SOLN: 250.0000 mL | INTRAVENOUS | Status: DC

## 2022-04-01 MED ORDER — SURGIFLO WITH THROMBIN (HEMOSTATIC MATRIX KIT) OPTIME
TOPICAL | Status: DC | PRN
  Administered 2022-04-01: 1 via TOPICAL

## 2022-04-01 MED ORDER — SODIUM CHLORIDE (PF) 0.9 % IJ SOLN
INTRAMUSCULAR | Status: DC | PRN
  Administered 2022-04-01: 60 mL via INTRAMUSCULAR

## 2022-04-01 MED ORDER — ACETAMINOPHEN 500 MG PO TABS
1000.0000 mg | ORAL_TABLET | Freq: Four times a day (QID) | ORAL | Status: AC
  Administered 2022-04-01 – 2022-04-02 (×4): 1000 mg via ORAL
  Filled 2022-04-01 (×4): qty 2

## 2022-04-01 MED ORDER — FENTANYL CITRATE (PF) 100 MCG/2ML IJ SOLN
INTRAMUSCULAR | Status: DC | PRN
  Administered 2022-04-01: 25 ug via INTRAVENOUS
  Administered 2022-04-01: 50 ug via INTRAVENOUS
  Administered 2022-04-01: 25 ug via INTRAVENOUS

## 2022-04-01 MED ORDER — FENTANYL CITRATE (PF) 100 MCG/2ML IJ SOLN
INTRAMUSCULAR | Status: AC
  Administered 2022-04-01: 50 ug via INTRAVENOUS
  Filled 2022-04-01: qty 2

## 2022-04-01 MED ORDER — BISACODYL 10 MG RE SUPP
10.0000 mg | Freq: Every day | RECTAL | Status: DC | PRN
  Administered 2022-04-02: 10 mg via RECTAL
  Filled 2022-04-01: qty 1

## 2022-04-01 MED ORDER — FENTANYL CITRATE (PF) 100 MCG/2ML IJ SOLN
25.0000 ug | INTRAMUSCULAR | Status: DC | PRN
  Administered 2022-04-01 (×2): 50 ug via INTRAVENOUS

## 2022-04-01 MED ORDER — DEXAMETHASONE SODIUM PHOSPHATE 10 MG/ML IJ SOLN
INTRAMUSCULAR | Status: DC | PRN
  Administered 2022-04-01: 10 mg via INTRAVENOUS

## 2022-04-01 MED ORDER — OXYCODONE HCL 5 MG PO TABS
10.0000 mg | ORAL_TABLET | ORAL | Status: DC | PRN
  Administered 2022-04-01 – 2022-04-03 (×4): 10 mg via ORAL
  Filled 2022-04-01 (×4): qty 2

## 2022-04-01 MED ORDER — PROPOFOL 500 MG/50ML IV EMUL
INTRAVENOUS | Status: DC | PRN
  Administered 2022-04-01: 120 ug/kg/min via INTRAVENOUS

## 2022-04-01 MED ORDER — PHENOL 1.4 % MT LIQD
1.0000 | OROMUCOSAL | Status: DC | PRN
  Administered 2022-04-02: 1 via OROMUCOSAL
  Filled 2022-04-01: qty 177

## 2022-04-01 MED ORDER — REMIFENTANIL HCL 1 MG IV SOLR
INTRAVENOUS | Status: DC | PRN
  Administered 2022-04-01: .1 ug/kg/min via INTRAVENOUS

## 2022-04-01 MED ORDER — VANCOMYCIN HCL IN DEXTROSE 1-5 GM/200ML-% IV SOLN
INTRAVENOUS | Status: AC
  Administered 2022-04-01: 1000 mg via INTRAVENOUS
  Filled 2022-04-01: qty 200

## 2022-04-01 MED ORDER — ONDANSETRON HCL 4 MG/2ML IJ SOLN
4.0000 mg | Freq: Four times a day (QID) | INTRAMUSCULAR | Status: DC | PRN
  Administered 2022-04-02: 4 mg via INTRAVENOUS
  Filled 2022-04-01: qty 2

## 2022-04-01 MED ORDER — OXYCODONE HCL 5 MG PO TABS
5.0000 mg | ORAL_TABLET | ORAL | Status: DC | PRN
  Administered 2022-04-02 – 2022-04-03 (×3): 5 mg via ORAL
  Filled 2022-04-01 (×3): qty 1

## 2022-04-01 MED ORDER — ONDANSETRON HCL 4 MG/2ML IJ SOLN
INTRAMUSCULAR | Status: AC
  Filled 2022-04-01: qty 2

## 2022-04-01 MED ORDER — ENOXAPARIN SODIUM 40 MG/0.4ML IJ SOSY
40.0000 mg | PREFILLED_SYRINGE | INTRAMUSCULAR | Status: DC
  Administered 2022-04-02: 40 mg via SUBCUTANEOUS
  Filled 2022-04-01: qty 0.4

## 2022-04-01 MED ORDER — FENTANYL CITRATE PF 50 MCG/ML IJ SOSY
50.0000 ug | PREFILLED_SYRINGE | Freq: Once | INTRAMUSCULAR | Status: AC
  Administered 2022-04-01: 50 ug via INTRAVENOUS

## 2022-04-01 MED ORDER — SUCCINYLCHOLINE CHLORIDE 200 MG/10ML IV SOSY
PREFILLED_SYRINGE | INTRAVENOUS | Status: AC
  Filled 2022-04-01: qty 10

## 2022-04-01 MED ORDER — LIDOCAINE HCL 4 % MT SOLN
OROMUCOSAL | Status: DC | PRN
  Administered 2022-04-01: 2 mL via TOPICAL

## 2022-04-01 MED ORDER — DEXMEDETOMIDINE (PRECEDEX) IN NS 20 MCG/5ML (4 MCG/ML) IV SYRINGE
PREFILLED_SYRINGE | INTRAVENOUS | Status: DC | PRN
  Administered 2022-04-01: 8 ug via INTRAVENOUS

## 2022-04-01 MED ORDER — ONDANSETRON HCL 4 MG/2ML IJ SOLN
INTRAMUSCULAR | Status: DC | PRN
  Administered 2022-04-01: 4 mg via INTRAVENOUS

## 2022-04-01 MED ORDER — DOCUSATE SODIUM 100 MG PO CAPS
100.0000 mg | ORAL_CAPSULE | Freq: Two times a day (BID) | ORAL | Status: DC
  Administered 2022-04-01 – 2022-04-03 (×4): 100 mg via ORAL
  Filled 2022-04-01 (×4): qty 1

## 2022-04-01 MED ORDER — VANCOMYCIN HCL 1000 MG IV SOLR
INTRAVENOUS | Status: DC | PRN
  Administered 2022-04-01: 1000 mg via TOPICAL

## 2022-04-01 MED ORDER — POLYETHYLENE GLYCOL 3350 17 G PO PACK
17.0000 g | PACK | Freq: Every day | ORAL | Status: DC | PRN
  Administered 2022-04-02: 17 g via ORAL
  Filled 2022-04-01 (×2): qty 1

## 2022-04-01 MED ORDER — OXYCODONE HCL 5 MG/5ML PO SOLN: 5.0000 mg | Freq: Once | ORAL | Status: AC | PRN

## 2022-04-01 MED ORDER — CHLORHEXIDINE GLUCONATE 0.12 % MT SOLN: 15.0000 mL | Freq: Once | OROMUCOSAL | Status: AC

## 2022-04-01 MED ORDER — 0.9 % SODIUM CHLORIDE (POUR BTL) OPTIME
TOPICAL | Status: DC | PRN
Start: 2022-04-01 — End: 2022-04-01
  Administered 2022-04-01: 1000 mL

## 2022-04-01 MED ORDER — PHENYLEPHRINE HCL-NACL 20-0.9 MG/250ML-% IV SOLN
INTRAVENOUS | Status: DC | PRN
  Administered 2022-04-01: 40 ug/min via INTRAVENOUS

## 2022-04-01 MED ORDER — CYANOCOBALAMIN 1000 MCG/ML IJ SOLN: 1000.0000 ug | INTRAMUSCULAR | Status: DC

## 2022-04-01 MED ORDER — PROPOFOL 1000 MG/100ML IV EMUL
INTRAVENOUS | Status: AC
  Filled 2022-04-01: qty 100

## 2022-04-01 MED ORDER — TRAZODONE HCL 100 MG PO TABS
100.0000 mg | ORAL_TABLET | Freq: Every day | ORAL | Status: DC
  Administered 2022-04-01 – 2022-04-02 (×2): 100 mg via ORAL
  Filled 2022-04-01 (×2): qty 1

## 2022-04-01 MED ORDER — DEXAMETHASONE SODIUM PHOSPHATE 10 MG/ML IJ SOLN
INTRAMUSCULAR | Status: AC
  Filled 2022-04-01: qty 1

## 2022-04-01 MED ORDER — REMIFENTANIL HCL 1 MG IV SOLR
INTRAVENOUS | Status: AC
  Filled 2022-04-01: qty 1000

## 2022-04-01 MED ORDER — SODIUM CHLORIDE 0.9 % IV SOLN: INTRAVENOUS | Status: DC | PRN

## 2022-04-01 MED ORDER — FENTANYL CITRATE PF 50 MCG/ML IJ SOSY
PREFILLED_SYRINGE | INTRAMUSCULAR | Status: AC
  Filled 2022-04-01: qty 1

## 2022-04-01 MED ORDER — ROCURONIUM BROMIDE 100 MG/10ML IV SOLN
INTRAVENOUS | Status: DC | PRN
  Administered 2022-04-01: 10 mg via INTRAVENOUS

## 2022-04-01 MED ORDER — CEFAZOLIN SODIUM-DEXTROSE 2-4 GM/100ML-% IV SOLN
INTRAVENOUS | Status: AC
  Filled 2022-04-01: qty 100

## 2022-04-01 MED ORDER — SENNA 8.6 MG PO TABS
1.0000 | ORAL_TABLET | Freq: Two times a day (BID) | ORAL | Status: DC
  Administered 2022-04-01 – 2022-04-03 (×4): 8.6 mg via ORAL
  Filled 2022-04-01 (×4): qty 1

## 2022-04-01 MED ORDER — ORAL CARE MOUTH RINSE: 15.0000 mL | Freq: Once | OROMUCOSAL | Status: AC

## 2022-04-01 MED ORDER — FLEET ENEMA 7-19 GM/118ML RE ENEM: 1.0000 | ENEMA | Freq: Once | RECTAL | Status: DC | PRN

## 2022-04-01 MED ORDER — MIDAZOLAM HCL 2 MG/2ML IJ SOLN
INTRAMUSCULAR | Status: DC | PRN
  Administered 2022-04-01: 2 mg via INTRAVENOUS

## 2022-04-01 MED ORDER — OXYCODONE HCL 5 MG PO TABS
ORAL_TABLET | ORAL | Status: AC
  Administered 2022-04-01: 5 mg via ORAL
  Filled 2022-04-01: qty 1

## 2022-04-01 MED ORDER — GLYCOPYRROLATE 0.2 MG/ML IJ SOLN
INTRAMUSCULAR | Status: DC | PRN
  Administered 2022-04-01: .2 mg via INTRAVENOUS

## 2022-04-01 MED ORDER — BUPIVACAINE HCL (PF) 0.5 % IJ SOLN
INTRAMUSCULAR | Status: AC
  Filled 2022-04-01: qty 30

## 2022-04-01 MED ORDER — SODIUM CHLORIDE 0.9 % IV SOLN: INTRAVENOUS | Status: DC

## 2022-04-01 MED ORDER — PHENYLEPHRINE HCL (PRESSORS) 10 MG/ML IV SOLN
INTRAVENOUS | Status: DC | PRN
  Administered 2022-04-01 (×3): 100 ug via INTRAVENOUS

## 2022-04-01 MED ORDER — ROCURONIUM BROMIDE 10 MG/ML (PF) SYRINGE
PREFILLED_SYRINGE | INTRAVENOUS | Status: AC
Start: 2022-04-01 — End: ?
  Filled 2022-04-01: qty 10

## 2022-04-01 MED ORDER — PROPOFOL 10 MG/ML IV BOLUS
INTRAVENOUS | Status: DC | PRN
  Administered 2022-04-01: 100 mg via INTRAVENOUS

## 2022-04-01 MED ORDER — GLYCOPYRROLATE 0.2 MG/ML IJ SOLN
INTRAMUSCULAR | Status: AC
  Filled 2022-04-01: qty 1

## 2022-04-01 MED ORDER — SODIUM CHLORIDE 0.9% FLUSH: 3.0000 mL | INTRAVENOUS | Status: DC | PRN

## 2022-04-01 MED ORDER — MENTHOL 3 MG MT LOZG
1.0000 | LOZENGE | OROMUCOSAL | Status: DC | PRN
  Filled 2022-04-01: qty 9

## 2022-04-01 MED ORDER — SODIUM CHLORIDE (PF) 0.9 % IJ SOLN
INTRAMUSCULAR | Status: AC
  Filled 2022-04-01: qty 20

## 2022-04-01 MED ORDER — LIDOCAINE HCL (PF) 2 % IJ SOLN
INTRAMUSCULAR | Status: AC
  Filled 2022-04-01: qty 5

## 2022-04-01 MED ORDER — MIDAZOLAM HCL 2 MG/2ML IJ SOLN
INTRAMUSCULAR | Status: AC
  Filled 2022-04-01: qty 2

## 2022-04-01 MED ORDER — OXYCODONE HCL 5 MG PO TABS: 5.0000 mg | ORAL_TABLET | Freq: Once | ORAL | Status: AC | PRN

## 2022-04-01 MED ORDER — BUPIVACAINE-EPINEPHRINE (PF) 0.5% -1:200000 IJ SOLN
INTRAMUSCULAR | Status: DC | PRN
  Administered 2022-04-01: 5 mL

## 2022-04-01 MED ORDER — METHOCARBAMOL 1000 MG/10ML IJ SOLN
500.0000 mg | Freq: Four times a day (QID) | INTRAVENOUS | Status: DC | PRN
  Administered 2022-04-01: 500 mg via INTRAVENOUS
  Filled 2022-04-01: qty 5
  Filled 2022-04-01: qty 500

## 2022-04-01 MED ORDER — NORTRIPTYLINE HCL 25 MG PO CAPS
25.0000 mg | ORAL_CAPSULE | Freq: Every day | ORAL | Status: DC
  Administered 2022-04-01 – 2022-04-02 (×2): 25 mg via ORAL
  Filled 2022-04-01 (×2): qty 1

## 2022-04-01 MED ORDER — METHOCARBAMOL 500 MG PO TABS: 500.0000 mg | ORAL_TABLET | Freq: Four times a day (QID) | ORAL | Status: DC | PRN

## 2022-04-01 MED ORDER — BUPIVACAINE-EPINEPHRINE (PF) 0.5% -1:200000 IJ SOLN
INTRAMUSCULAR | Status: AC
  Filled 2022-04-01: qty 30

## 2022-04-01 MED ORDER — SURGIPHOR WOUND IRRIGATION SYSTEM - OPTIME
TOPICAL | Status: DC | PRN
  Administered 2022-04-01: 450 mL

## 2022-04-01 MED ORDER — HYDROMORPHONE HCL 1 MG/ML IJ SOLN: 0.5000 mg | INTRAMUSCULAR | Status: AC | PRN

## 2022-04-01 MED ORDER — ONDANSETRON HCL 4 MG PO TABS: 4.0000 mg | ORAL_TABLET | Freq: Four times a day (QID) | ORAL | Status: DC | PRN

## 2022-04-01 MED ORDER — LACTATED RINGERS IV SOLN: INTRAVENOUS | Status: DC

## 2022-04-01 MED ORDER — SUCCINYLCHOLINE CHLORIDE 200 MG/10ML IV SOSY
PREFILLED_SYRINGE | INTRAVENOUS | Status: DC | PRN
  Administered 2022-04-01: 80 mg via INTRAVENOUS

## 2022-04-01 MED ORDER — CLONAZEPAM 0.5 MG PO TABS
0.5000 mg | ORAL_TABLET | Freq: Every day | ORAL | Status: DC | PRN
  Administered 2022-04-02: 0.5 mg via ORAL
  Filled 2022-04-01: qty 1

## 2022-04-01 SURGICAL SUPPLY — 88 items
ADH SKN CLS APL DERMABOND .7 (GAUZE/BANDAGES/DRESSINGS) ×2
AGENT HMST KT MTR STRL THRMB (HEMOSTASIS) ×2
ALLOGRAFT BONE FIBER KORE 10CC (Bone Implant) ×2 IMPLANT
APL PRP STRL LF DISP 70% ISPRP (MISCELLANEOUS) ×4
BONE CANC CHIPS 20CC PCAN1/4 (Bone Implant) ×3 IMPLANT
BUR NEURO DRILL SOFT 3.0X3.8M (BURR) ×3 IMPLANT
CAP LOCKING THREADED (Cap) ×8 IMPLANT
CHIPS CANC BONE 20CC PCAN1/4 (Bone Implant) ×2 IMPLANT
CHLORAPREP W/TINT 26 (MISCELLANEOUS) ×6 IMPLANT
CNTNR SPEC 2.5X3XGRAD LEK (MISCELLANEOUS) ×2
CONT SPEC 4OZ STER OR WHT (MISCELLANEOUS) ×1
CONT SPEC 4OZ STRL OR WHT (MISCELLANEOUS) ×2
CONTAINER SPEC 2.5X3XGRAD LEK (MISCELLANEOUS) ×2 IMPLANT
COUNTER NEEDLE 20/40 LG (NEEDLE) ×3 IMPLANT
CUP MEDICINE 2OZ PLAST GRAD ST (MISCELLANEOUS) ×6 IMPLANT
DERMABOND ADVANCED (GAUZE/BANDAGES/DRESSINGS) ×1
DERMABOND ADVANCED .7 DNX12 (GAUZE/BANDAGES/DRESSINGS) ×2 IMPLANT
DRAPE 3D C-ARM OEC (DRAPES) IMPLANT
DRAPE C ARM PK CFD 31 SPINE (DRAPES) ×2 IMPLANT
DRAPE C-ARMOR (DRAPES) ×2 IMPLANT
DRAPE INCISE IOBAN 66X45 STRL (DRAPES) ×1 IMPLANT
DRAPE LAPAROTOMY 100X77 ABD (DRAPES) ×3 IMPLANT
DRAPE MICROSCOPE SPINE 48X150 (DRAPES) IMPLANT
DRAPE SCAN PATIENT (DRAPES) ×1 IMPLANT
DRAPE SURG 17X11 SM STRL (DRAPES) ×4 IMPLANT
DRSG OPSITE 4X5.5 SM (GAUZE/BANDAGES/DRESSINGS) IMPLANT
DRSG OPSITE POSTOP 4X8 (GAUZE/BANDAGES/DRESSINGS) ×2 IMPLANT
DRSG TEGADERM 4X4.75 (GAUZE/BANDAGES/DRESSINGS) ×2 IMPLANT
ELECT CAUTERY BLADE TIP 2.5 (TIP) ×3
ELECT EZSTD 165MM 6.5IN (MISCELLANEOUS) ×3
ELECT REM PT RETURN 9FT ADLT (ELECTROSURGICAL) ×3
ELECTRODE CAUTERY BLDE TIP 2.5 (TIP) ×2 IMPLANT
ELECTRODE EZSTD 165MM 6.5IN (MISCELLANEOUS) ×1 IMPLANT
ELECTRODE REM PT RTRN 9FT ADLT (ELECTROSURGICAL) ×2 IMPLANT
EX-PIN ORTHOLOCK NAV 4X150 (PIN) IMPLANT
FEE INTRAOP CADWELL SUPPLY NCS (MISCELLANEOUS) IMPLANT
FEE INTRAOP MONITOR IMPULS NCS (MISCELLANEOUS) IMPLANT
GAUZE 4X4 16PLY ~~LOC~~+RFID DBL (SPONGE) ×1 IMPLANT
GLOVE BIOGEL PI IND STRL 6.5 (GLOVE) ×4 IMPLANT
GLOVE BIOGEL PI INDICATOR 6.5 (GLOVE) ×2
GLOVE SURG SYN 6.5 ES PF (GLOVE) ×6 IMPLANT
GLOVE SURG SYN 6.5 PF PI (GLOVE) ×2 IMPLANT
GLOVE SURG SYN 8.5  E (GLOVE) ×12
GLOVE SURG SYN 8.5 E (GLOVE) ×8 IMPLANT
GLOVE SURG SYN 8.5 PF PI (GLOVE) ×4 IMPLANT
GOWN SRG LRG LVL 4 IMPRV REINF (GOWNS) ×6 IMPLANT
GOWN SRG XL LVL 3 NONREINFORCE (GOWNS) ×2 IMPLANT
GOWN STRL NON-REIN TWL XL LVL3 (GOWNS) ×3
GOWN STRL REIN LRG LVL4 (GOWNS) ×9
GRADUATE 1200CC STRL 31836 (MISCELLANEOUS) ×3 IMPLANT
GRAFT BNE CANC CHIPS 1-8 20CC (Bone Implant) IMPLANT
HEMOVAC 400CC 10FR (MISCELLANEOUS) ×2 IMPLANT
INTERBODY SABLE 10X26 7-14 15D (Miscellaneous) ×2 IMPLANT
INTRAOP CADWELL SUPPLY FEE NCS (MISCELLANEOUS)
INTRAOP DISP SUPPLY FEE NCS (MISCELLANEOUS)
INTRAOP MONITOR FEE IMPULS NCS (MISCELLANEOUS)
INTRAOP MONITOR FEE IMPULSE (MISCELLANEOUS)
KIT PREVENA INCISION MGT 13 (CANNISTER) IMPLANT
KIT SPINAL PRONEVIEW (KITS) ×3 IMPLANT
KNIFE BAYONET SHORT DISCETOMY (MISCELLANEOUS) IMPLANT
MANIFOLD NEPTUNE II (INSTRUMENTS) ×3 IMPLANT
MARKER SKIN DUAL TIP RULER LAB (MISCELLANEOUS) ×6 IMPLANT
MARKER SPHERE PSV REFLC 13MM (MARKER) ×7 IMPLANT
NDL SAFETY ECLIPSE 18X1.5 (NEEDLE) ×2 IMPLANT
NEEDLE HYPO 18GX1.5 SHARP (NEEDLE) ×3
NEEDLE HYPO 22GX1.5 SAFETY (NEEDLE) ×3 IMPLANT
NS IRRIG 1000ML POUR BTL (IV SOLUTION) ×3 IMPLANT
PACK LAMINECTOMY NEURO (CUSTOM PROCEDURE TRAY) ×3 IMPLANT
ROD CREO 45MM SPINAL (Rod) ×4 IMPLANT
SCREW CREO SPINAL 6.5X45 (Screw) ×8 IMPLANT
SOLUTION IRRIG SURGIPHOR (IV SOLUTION) ×4 IMPLANT
SPONGE GAUZE 2X2 8PLY STRL LF (GAUZE/BANDAGES/DRESSINGS) ×3 IMPLANT
STAPLER SKIN PROX 35W (STAPLE) IMPLANT
SURGIFLO W/THROMBIN 8M KIT (HEMOSTASIS) ×3 IMPLANT
SUT DVC VLOC 3-0 CL 6 P-12 (SUTURE) ×3 IMPLANT
SUT ETHILON 3-0 FS-10 30 BLK (SUTURE) ×3
SUT VIC AB 0 CT1 27 (SUTURE) ×6
SUT VIC AB 0 CT1 27XCR 8 STRN (SUTURE) ×3 IMPLANT
SUT VIC AB 2-0 CT1 18 (SUTURE) ×5 IMPLANT
SUTURE EHLN 3-0 FS-10 30 BLK (SUTURE) ×1 IMPLANT
SYR 20ML LL LF (SYRINGE) ×3 IMPLANT
SYR 30ML LL (SYRINGE) ×6 IMPLANT
TOWEL OR 17X26 4PK STRL BLUE (TOWEL DISPOSABLE) ×9 IMPLANT
TRAY FOLEY SLVR 16FR LF STAT (SET/KITS/TRAYS/PACK) ×2 IMPLANT
TROCAR INSERT W/PEDICLE NDL (TROCAR) IMPLANT
TROCAR INSERT W/PEDICLE NDLE (TROCAR)
TUBING CONNECTING 10 (TUBING) ×3 IMPLANT
WATER STERILE IRR 500ML POUR (IV SOLUTION) IMPLANT

## 2022-04-01 NOTE — H&P (Signed)
DOS: 11/06/21 right L4-5 far lateral discectomy with Dr. Izora Ribas   HISTORY OF PRESENT ILLNESS:  04/01/2022  [name] presents today with ongoing symptoms. She failed conservative management prompting surgical intervention.  4/11/23[name] is status post lumbar decompression. she is doing poorly.   She has had increasing right leg pain. This is incapacitating. She is here to review her imaging today.  Allergies  Allergen Reactions   Eliquis [Apixaban] Nausea Only    Dizziness   Sulfa Antibiotics Rash    Current Meds  Medication Sig   clonazePAM (KLONOPIN) 0.5 MG tablet TAKE 1 TABLET BY MOUTH ONCE DAILY AS NEEDED FOR ANXIETY   cyanocobalamin (,VITAMIN B-12,) 1000 MCG/ML injection Inject 1 mL (1,000 mcg total) into the muscle every 30 (thirty) days.   nortriptyline (PAMELOR) 25 MG capsule Take 1 capsule (25 mg total) by mouth at bedtime.   omeprazole (PRILOSEC) 40 MG capsule TAKE 1 CAPSULE BY MOUTH TWICE DAILY (Patient taking differently: Take 40 mg by mouth at bedtime.)   rosuvastatin (CRESTOR) 10 MG tablet TAKE 1 TABLET BY MOUTH AT BEDTIME   sertraline (ZOLOFT) 100 MG tablet TAKE 1 TABLET BY MOUTH ONCE DAILY (Patient taking differently: Take 100 mg by mouth at bedtime.)   Syringe/Needle, Disp, (SYRINGE 3CC/25GX1") 25G X 1" 3 ML MISC Use for b12 injections   traZODone (DESYREL) 50 MG tablet Take 2 tablets (100 mg total) by mouth at bedtime.   warfarin (COUMADIN) 1 MG tablet TAKE 2 TABLET BY MOUTH ONCE DAILY WITH THE 5MG FOR 7MG TOTAL DAILY (Patient taking differently: Take 2 mg by mouth See admin instructions. Take with 5 mg every evening to = 7 mg)   warfarin (COUMADIN) 5 MG tablet Take 1 tablet (5 mg total) by mouth at bedtime. Take with 2 mg to equal 7 mg every night   [DISCONTINUED] clonazePAM (KLONOPIN) 0.5 MG tablet Take 1 tablet (0.5 mg total) by mouth daily as needed for anxiety.   Family History  Problem Relation Age of Onset   Diabetes Mother        type 2    Heart disease Mother    Hypertension Mother    Coronary artery disease Father    Heart attack Sister     Social History   Socioeconomic History   Marital status: Widowed    Spouse name: Not on file   Number of children: 3   Years of education: Not on file   Highest education level: Not on file  Occupational History   Not on file  Tobacco Use   Smoking status: Former    Packs/day: 1.00    Years: 50.00    Pack years: 50.00    Types: Cigarettes    Quit date: 02/21/2022    Years since quitting: 0.1   Smokeless tobacco: Never  Vaping Use   Vaping Use: Never used  Substance and Sexual Activity   Alcohol use: No    Alcohol/week: 0.0 standard drinks   Drug use: No   Sexual activity: Not on file  Other Topics Concern   Not on file  Social History Narrative   Three sons; lives alone   Social Determinants of Health   Financial Resource Strain: Low Risk    Difficulty of Paying Living Expenses: Not hard at all  Food Insecurity: No Food Insecurity   Worried About Charity fundraiser in the Last Year: Never true   [place] in the Last Year: Never true  Transportation Needs: No  Transportation Needs   Lack of Transportation (Medical): No   Lack of Transportation (Non-Medical): No  Physical Activity: Unknown   Days of Exercise per Week: 0 days   Minutes of Exercise per Session: Not on file  Stress: No Stress Concern Present   Feeling of Stress : Not at all  Social Connections: Not on file  Intimate Partner Violence: Not At Risk   Fear of Current or Ex-Partner: No   Emotionally Abused: No   Physically Abused: No   Sexually Abused: No     PHYSICAL EXAMINATION:  Vitals:   04/01/22 1201  BP: 127/77  Pulse: 96  Resp: 16  Temp: 98.1 F (36.7 C)  SpO2: 96%   Heart sounds normal no MRG. Chest Clear to Auscultation Bilaterally.  General: Patient is well developed, well nourished, calm, collected, and in no apparent distress.  NEUROLOGICAL:  General: In no acute  distress.  Awake, alert, oriented to person, place, and time. Pupils equal round and reactive to light.   Strength:  Side Iliopsoas Quads Hamstring PF DF EHL  R 5 5 5 5 5 5   L 5 5 5 5 5 5    Incision c/d/i and healing well   ROS (Neurologic): Negative except as noted above  IMAGING: MRI L spine 02/05/2022 MPRESSION:  Previous right hemilaminotomy and partial facetectomy at L4-5.  Recurrent right posterolateral to foraminal disc herniation which  could compress the right L4 and L5 nerves.   Electronically Signed    By: Nelson Chimes M.D.    On: 02/06/2022 13:10  ASSESSMENT/PLAN:  Tamara [name] is doing poorly after right L4-5 far lateral discectomy. She has recurrent disc herniation at L4-5. She has concordant right L4 and L5 symptoms.   She failed conservative management.  We will proceed with  L4-5 transforaminal lumbar interbody fusion.   Meade Maw MD, [facility] Department of Neurosurgery

## 2022-04-01 NOTE — Anesthesia Preprocedure Evaluation (Signed)
Anesthesia Evaluation  Patient identified by MRN, date of birth, ID band Patient awake    Reviewed: Allergy & Precautions, NPO status , Patient's Chart, lab work & pertinent test results  History of Anesthesia Complications Negative for: history of anesthetic complications  Airway Mallampati: III  TM Distance: <3 FB Neck ROM: full    Dental  (+) Chipped, Poor Dentition, Missing   Pulmonary sleep apnea , COPD, Patient abstained from smoking., former smoker,     + decreased breath sounds      Cardiovascular (-) angina+ CAD  (-) Past MI and (-) DOE Normal cardiovascular exam     Neuro/Psych  Headaches, PSYCHIATRIC DISORDERS  Neuromuscular disease    GI/Hepatic Neg liver ROS, hiatal hernia, GERD  Controlled,  Endo/Other  negative endocrine ROS  Renal/GU      Musculoskeletal   Abdominal   Peds  Hematology negative hematology ROS (+)   Anesthesia Other Findings Past Medical History: 05/2021: Acute deep vein thrombosis (DVT) of left lower extremity  ([place]) 01/2007: Acute deep vein thrombosis (DVT) of right lower extremity  ([place])     Comment:  a.) occurred postoperatively; s/p IVC filter placement 04/24/2018: Acute deep vein thrombosis (DVT) of right lower extremity  ([place]) 12/13/2014: Amaurosis fugax     Comment:   Brain MRA suggested vertebral artery partial occlusion               75 to 90% stenosed. Will treat for prevention of embolic               stroke,  Refer to [facility] for CTA and               stent and refer to cardiology for ECHO  No date: Anxiety     Comment:  a.) on BZO (clonazepam) PRN No date: Aortic atherosclerosis (HCC) No date: Arthritis No date: B12 deficiency No date: Carpal tunnel syndrome No date: Cataracts, bilateral 05/24/2021: Clostridium difficile infection No date: COPD (chronic obstructive pulmonary disease) (HCC)     Comment:  no inhalers No date: Crohn's disease  ([place]) No date: Depression 12/04/2017: Dysphagia 07/11/2018: Facial twitching No date: GERD (gastroesophageal reflux disease) 06/14/1997: History of cardiac catheterization     Comment:  a.) LHC 06/14/1997 at [facility]: EF 60%; normal coronaries;               no obstructive CAD. No date: History of hiatal hernia No date: History of kidney stones No date: Hx of splenectomy     Comment:  a.) reports spleen was "ruptured" during routine               colonoscopy in [place], which necessitated surgical               intervention 05/20/2019: Intractable chronic migraine without aura and without  status migrainosus No date: Leukocytosis No date: Long term current use of anticoagulant     Comment:  a.) warfarin No date: Moderate tricuspid insufficiency 12/2016: MRSA colonization No date: Osteoporosis No date: Pneumonia No date: Sleep apnea No date: Small bowel obstruction ([place])     Comment:  a.) 2001, 2004 (reversal of jejunal bypass). b.) s/p               laparotomy and lysis of adhesions 01/2007 No date: Spinal stenosis of lumbar region No date: Vitamin D deficiency  Past Surgical History: No date: ANAL SPHINCTER PROSTHESIS PLACEMENT No date: APPENDECTOMY 06/14/1997: CARDIAC CATHETERIZATION; Left     Comment:  Procedure: CARDIAC CATHETERIZATION; Location: [facility];               Surgeon: Reynolds Bowl, MD 04/22/2014: CARPAL TUNNEL RELEASE; Right No date: CARPAL TUNNEL RELEASE; Left No date: CATARACT EXTRACTION; Bilateral 2005: CERVICAL FUSION     Comment:  C6-7, anterior approach No date: CHOLECYSTECTOMY No date: COLONOSCOPY     Comment:  2005, 2007, 2010, 2015, 2020, 2021 05/10/2019: COLONOSCOPY WITH PROPOFOL; N/A     Comment:  Procedure: COLONOSCOPY WITH PROPOFOL;  Surgeon: Manya Silvas, MD;  Location: [facility] ENDOSCOPY;  Service:               Endoscopy;  Laterality: N/A; 02/11/2018: ESOPHAGEAL MANOMETRY; N/A     Comment:  Procedure: ESOPHAGEAL MANOMETRY (EM);   Surgeon: Lucilla Lame, MD;  Location: [facility] ENDOSCOPY;  Service:               Endoscopy;  Laterality: N/A; No date: ESOPHAGOGASTRODUODENOSCOPY     Comment:  1996, 2006, 2008, 2010, 2014, 2018, 2021 12/17/2017: ESOPHAGOGASTRODUODENOSCOPY (EGD) WITH PROPOFOL; N/A     Comment:  Procedure: ESOPHAGOGASTRODUODENOSCOPY (EGD) WITH               PROPOFOL;  Surgeon: Robert Bellow, MD;  Location:               [facility] ENDOSCOPY;  Service: Endoscopy;  Laterality: N/A; 01/08/2018: ESOPHAGOGASTRODUODENOSCOPY (EGD) WITH PROPOFOL; N/A     Comment:  Procedure: ESOPHAGOGASTRODUODENOSCOPY (EGD) WITH               PROPOFOL;  Surgeon: Robert Bellow, MD;  Location:               [facility] ENDOSCOPY;  Service: Endoscopy;  Laterality: N/A; 09/18/2021: EXCISION NEUROMA; Left     Comment:  Procedure: Excision of traumatic neuroma infrapatellar               branch saphenous nerve left knee.;  Surgeon: Corky Mull, MD;  Location: [facility] ORS;  Service: Orthopedics;                Laterality: Left; No date: GIVENS CAPSULE STUDY     Comment:  2005, 2006 No date: INSERTION OF VENA CAVA FILTER; Right 06/22/2020: KNEE ARTHROSCOPY WITH MEDIAL MENISECTOMY; Left     Comment:  Procedure: Left knee arthroscopic partial medial               meniscectomy;  Surgeon: Leim Fabry, MD;  Location:               [place];  Service: Orthopedics;  Laterality:               Left; 05/25/2021: KNEE ARTHROSCOPY WITH MEDIAL MENISECTOMY; Left     Comment:  Procedure: Left knee arthroscopy, infrapatellar fat pad               excision;  Surgeon: Leim Fabry, MD;  Location: [facility]               ORS;  Service: Orthopedics;  Laterality: Left; 01/30/2007: LAPAROTOMY     Comment:  for bowel obstruction with LOA 12/25/2016: LUMBAR LAMINECTOMY/DECOMPRESSION MICRODISCECTOMY; N/A     Comment:  Procedure: LUMBAR DECOMPRESSION L4-5;  Surgeon: Ardyth Gal  Izora Ribas, MD;  Location: [facility] ORS;   Service:               Neurosurgery;  Laterality: N/A; No date: NISSEN FUNDOPLICATION No date: ROTATOR CUFF REPAIR; Bilateral No date: SPLENECTOMY     Comment:  secondary to colonoscopy No date: TONSILLECTOMY AND ADENOIDECTOMY 08/25/2015: TRIGGER FINGER RELEASE; Left 09/18/2015: TRIGGER FINGER RELEASE; Right No date: VAGINAL HYSTERECTOMY  BMI    Body Mass Index: 16.72 kg/m      Reproductive/Obstetrics negative OB ROS                             Anesthesia Physical Anesthesia Plan  ASA: 3  Anesthesia Plan: General ETT   Post-op Pain Management:    Induction: Intravenous  PONV Risk Score and Plan: Ondansetron, Dexamethasone, Midazolam and Treatment may vary due to age or medical condition  Airway Management Planned: Oral ETT and Video Laryngoscope Planned  Additional Equipment:   Intra-op Plan:   Post-operative Plan: Extubation in OR  Informed Consent: I have reviewed the patients History and Physical, chart, labs and discussed the procedure including the risks, benefits and alternatives for the proposed anesthesia with the patient or authorized representative who has indicated his/her understanding and acceptance.     Dental Advisory Given  Plan Discussed with: Anesthesiologist, CRNA and Surgeon  Anesthesia Plan Comments: (Patient consented for risks of anesthesia including but not limited to:  - adverse reactions to medications - damage to eyes, teeth, lips or other oral mucosa - nerve damage due to positioning  - sore throat or hoarseness - Damage to heart, brain, nerves, lungs, other parts of body or loss of life  Patient voiced understanding.)        Anesthesia Quick Evaluation

## 2022-04-01 NOTE — Op Note (Signed)
Indications: Tamara [name] is a 76 yo female who presented with: M43.10 anterolisthesis, M54.16 lumbar radiculopathy She failed conservative management  Findings: correction of anterolisthesis  Preoperative Diagnosis: M43.10 anterolisthesis, M54.16 lumbar radiculopathy Postoperative Diagnosis: same   EBL: 50 ml IVF: see AR ml Drains: 1 placed Disposition: Extubated and Stable to PACU Complications: none  No foley catheter was placed.   Preoperative Note:   Risks of surgery discussed include: infection, bleeding, stroke, coma, death, paralysis, CSF leak, nerve/spinal cord injury, numbness, tingling, weakness, complex regional pain syndrome, recurrent stenosis and/or disc herniation, vascular injury, development of instability, neck/back pain, need for further surgery, persistent symptoms, development of deformity, and the risks of anesthesia. The patient understood these risks and agreed to proceed.  Operative Note:  1. Transforaminal Lumbar Interbody Fusion L4/5 2. Posterolateral arthrodesis L4 to L5 3. Posterior nonsegmental instrumentation L4 to L5 using Globus Creo 4. Harvesting of autograft via the same incision 5. Placement of a biomechanical device (Scarbro) at L4/5 for anterior arthrodesis    The patient was brought to the Operating Room, intubated and turned into the prone position. All pressure points were checked and double checked. Flouroscopy was used to mark the incision. The patient was prepped and draped in the standard fashion. A full timeout was performed. Preoperative antibiotics were given. The incision was injected with local anesthetic.  The incision was opened with a scalpel, then the soft tissues divided with the Bovie. Self-retaining retractors were placed. The paraspinus muscles were reflected laterally in subperiosteal fashion until the transverse processes were visible. Flouroscopy was used to confirm our localization.   The self-retaining retractors  were repositioned. We then placed pedicle screws.  At L4 on one side, a starting point was chosen based on anatomic landmarks, then breached with a high speed drill. A pedicle finder probe was used to cannulate the pedicle, then the balltip probe used to confirm lack of breach. The tract was tapped, re-checked with the balltip probe, then a 6.5 x 45 mm pedicle screw was placed. The procedure was then repeated contralaterally and the same size screw placed. At L5 on one side, a starting point was chosen based on anatomic landmarks, then breached with a high speed drill. A pedicle finder probe was used to cannulate the pedicle, then the balltip probe used to confirm lack of breach. The tract was tapped, re-checked with the balltip probe, then a 6.5 x 45 mm pedicle screw was placed. The procedure was then repeated contralaterally and the same size screw placed.  After placement of pedicle screws, a screw-to-screw distractor was placed to distract the disc space. We then turned attention to performing the transforaminal decompression and interbody fusion. The right L4/5 facet was removed with osteotomes and the drill, and handed off for preparation as autograft. The traversing and exiting nerve roots on the right were identified and protected. The disc was opened using a scalpel. After incising the disc space, we took a combination of pituitary rongeurs, Kerrison rongeurs, disc scrapers, and curettes to remove a majority of the disc material.  We prepared the end plates for accepting the interbody fusion.  We removed the cartilaginous plate, preserved the cortical endplate if possible during this procedure.  We serially dilated up in order to increase the size of the disc space, while protecting the traversing and exiting nerve roots, until we had sized up to a trial.    The trial was removed, and the disc space packed with autograft and allograft. The [place]  TLIF biomechanical device was inserted, then  backfilled with a mixture of allograft and autograft, with care taken to protect the nerve roots and thecal sac. After placement of the device, the screw-screw distractor was removed.  Rods were measured to length, cut, and shaped. The rods were secured using locking caps to manufacturer's specifications. Final AP and lateral radiographs were taken to confirm placement of instrumentation and appropriate alignment. The wound was copiously irrigated, then the external surfaces of the remaining lamina, facet, and transverse processes from L4 to L5 were decorticated. A mixture of allograft and autograft was placed over the decorticated surfaces for arthrodesis.  A drain was placed subfascially.  After hemostasis, the wound was closed in layers with 0 and 2-0 vicryl. Staples were used on the skin.  The patient was then flipped supine and extubated with incident. All counts were correct times 2 at the end of the case. No immediate complications were noted.  Cooper Render PA assisted in the entire procedure.  Meade Maw MD

## 2022-04-01 NOTE — Anesthesia Procedure Notes (Signed)
Procedure Name: Intubation Date/Time: 04/01/2022 2:07 PM Performed by: Rolla Plate, CRNA Pre-anesthesia Checklist: Patient identified, Patient being monitored, Timeout performed, Emergency Drugs available and Suction available Patient Re-evaluated:Patient Re-evaluated prior to induction Oxygen Delivery Method: Circle system utilized Preoxygenation: Pre-oxygenation with 100% oxygen Induction Type: IV induction Ventilation: Mask ventilation without difficulty Laryngoscope Size: Mac and 3 Grade View: Grade I Tube type: Oral Tube size: 7.0 mm Number of attempts: 1 Airway Equipment and Method: Stylet Placement Confirmation: ETT inserted through vocal cords under direct vision, positive ETCO2 and breath sounds checked- equal and bilateral Secured at: 20 cm Tube secured with: Tape Dental Injury: Teeth and Oropharynx as per pre-operative assessment

## 2022-04-01 NOTE — Transfer of Care (Signed)
Immediate Anesthesia Transfer of Care Note  Patient: Tamara [name]  Procedure(s) Performed: OPEN L4-5 TRANSFORAMINAL LUMBAR INTERBODY FUSION (TLIF) (Spine Lumbar)  Patient Location: PACU  Anesthesia Type:General  Level of Consciousness: awake and alert   Airway & Oxygen Therapy: Patient Spontanous Breathing and Patient connected to face mask oxygen  Post-op Assessment: Report given to RN and Post -op Vital signs reviewed and stable  Post vital signs: Reviewed and stable  Last Vitals:  Vitals Value Taken Time  BP 145/82 04/01/22 1615  Temp    Pulse 100 04/01/22 1617  Resp    SpO2 98 % 04/01/22 1617  Vitals shown include unvalidated device data.  Last Pain:  Vitals:   04/01/22 1201  TempSrc: Oral  PainSc: 8          Complications: No notable events documented.

## 2022-04-02 ENCOUNTER — Encounter: Payer: Self-pay | Admitting: Neurosurgery

## 2022-04-02 MED ORDER — ENOXAPARIN SODIUM 30 MG/0.3ML IJ SOSY
30.0000 mg | PREFILLED_SYRINGE | INTRAMUSCULAR | Status: DC
  Administered 2022-04-03: 30 mg via SUBCUTANEOUS
  Filled 2022-04-02: qty 0.3

## 2022-04-02 NOTE — Progress Notes (Signed)
PHARMACIST - PHYSICIAN COMMUNICATION  CONCERNING:  Enoxaparin (Lovenox) for DVT Prophylaxis    RECOMMENDATION: Patient was prescribed enoxaprin 3m q24 hours for VTE prophylaxis.   Filed Weights   04/01/22 1201  Weight: 42.8 kg (94 lb 6.4 oz)    Body mass index is 16.72 kg/m.  Estimated Creatinine Clearance: 41.7 mL/min (by C-G formula based on SCr of 0.75 mg/dL).    Patient is candidate for enoxaparin 343mevery 24 hours based on CrCl <3035min or Weight <45kg  DESCRIPTION: [facility].  Patient is now receiving enoxaparin 30 mg every 24 hours    CarDorothe PeaharmD, BCPS Clinical Pharmacist   04/02/2022 5:10 PM

## 2022-04-02 NOTE — Progress Notes (Signed)
Physical Therapy Treatment Patient Details Name: Tamara [name] MRN: [id number] DOB: 1945-11-12 Today's Date: 04/02/2022   History of Present Illness Pt is a 76 y.o. female s/p L4-5 TLIF 04/01/22 (d/t increasing R leg pain with recurrent disc herniation L4-5).  PMH includes sleep apnea, COPD, CAD, hiatal hernia, HA's, DVT B LE's; amaurosis fugax, anxiety, carpal tunnel syndrome, COPD, Crohn's disease, dysphagia, facial twitching, intractable chronic migraine, sleep apnea, SBO, R L4-5 far lateral discectomy 11/06/21.    PT Comments    Pt resting in bed upon PT arrival; agreeable to PT session.  During session pt modified independent (via logrolling) with bed mobility; modified independent with transfers; and SBA ambulating 500 feet (no AD use).  Pt reporting no pain at rest but 4-6/10 low back pain with activity.  Able to donn/doff back brace modified independently.  Reviewed spinal precautions.  Will continue to focus on strengthening, balance, and progressive functional mobility during hospitalization.    Recommendations for follow up therapy are one component of a multi-disciplinary discharge planning process, led by the attending physician.  Recommendations may be updated based on patient status, additional functional criteria and insurance authorization.  Follow Up Recommendations  Follow physician's recommendations for discharge plan and follow up therapies     Assistance Recommended at Discharge PRN  Patient can return home with the following Assist for transportation   Equipment Recommendations  None recommended by PT    Recommendations for Other Services       Precautions / Restrictions Precautions Precautions: Back Precaution Comments: Apply/remove brace while sitting.  May remove brace when in bed.  May ambulate to bathroom without brace.  May remove brace to shower. Required Braces or Orthoses: Spinal Brace Spinal Brace: Lumbar corset;Applied in sitting  position Restrictions Weight Bearing Restrictions: No     Mobility  Bed Mobility Overal bed mobility: Modified Independent Bed Mobility: Rolling, Sidelying to Sit, Sit to Sidelying Rolling: Modified independent (Device/Increase time) Sidelying to sit: Modified independent (Device/Increase time)     Sit to sidelying: Modified independent (Device/Increase time) General bed mobility comments: via logrolling technique    Transfers Overall transfer level: Modified independent Equipment used: None               General transfer comment: steady safe transfers    Ambulation/Gait Ambulation/Gait assistance: Supervision Gait Distance (Feet): 500 Feet Assistive device: None Gait Pattern/deviations: Step-through pattern Gait velocity: mildly decreased     General Gait Details: very occasional mild unsteadiness but no loss of balance   Stairs             Wheelchair Mobility    Modified Rankin (Stroke Patients Only)       Balance Overall balance assessment: Needs assistance Sitting-balance support: No upper extremity supported, Feet supported Sitting balance-Leahy Scale: Good Sitting balance - Comments: steady sitting reaching within BOS   Standing balance support: No upper extremity supported, During functional activity Standing balance-Leahy Scale: Good Standing balance comment: no loss of balance with ambulation                            Cognition Arousal/Alertness: Awake/alert Behavior During Therapy: WFL for tasks assessed/performed Overall Cognitive Status: Within Functional Limits for tasks assessed                                          Exercises  General Comments General comments (skin integrity, edema, etc.): hemovac in place.  Nursing cleared pt for participation in physical therapy.  Pt agreeable to PT session.      Pertinent Vitals/Pain Pain Assessment Pain Assessment: 0-10 Pain Score: 0-No  pain Pain Location: back Pain Descriptors / Indicators: Discomfort, Burning Pain Intervention(s): Limited activity within patient's tolerance, Monitored during session, Repositioned Vitals (HR and O2 on room air) stable and WFL throughout treatment session.    Home Living                          Prior Function            PT Goals (current goals can now be found in the care plan section) Acute Rehab PT Goals Patient Stated Goal: to go home PT Goal Formulation: With patient Time For Goal Achievement: 04/16/22 Potential to Achieve Goals: Good Progress towards PT goals: Progressing toward goals    Frequency    BID      PT Plan Current plan remains appropriate    Co-evaluation              AM-PAC PT "6 Clicks" Mobility   Outcome Measure  Help needed turning from your back to your side while in a flat bed without using bedrails?: None Help needed moving from lying on your back to sitting on the side of a flat bed without using bedrails?: None Help needed moving to and from a bed to a chair (including a wheelchair)?: None Help needed standing up from a chair using your arms (e.g., wheelchair or bedside chair)?: None Help needed to walk in hospital room?: A Little Help needed climbing 3-5 steps with a railing? : A Little 6 Click Score: 22    End of Session Equipment Utilized During Treatment: Gait belt;Back brace Activity Tolerance: Patient tolerated treatment well Patient left: with call bell/phone within reach;with bed alarm set;with nursing/sitter in room;in bed Nurse Communication: Mobility status;Precautions PT Visit Diagnosis: Unsteadiness on feet (R26.81);Other abnormalities of gait and mobility (R26.89);Muscle weakness (generalized) (M62.81);Pain Pain - Right/Left:  (low back)     Time: 0511-0211 PT Time Calculation (min) (ACUTE ONLY): 15 min  Charges:  $Therapeutic Activity: 8-22 mins                    Leitha Bleak, PT 04/02/22, 5:23  PM

## 2022-04-02 NOTE — Anesthesia Postprocedure Evaluation (Signed)
Anesthesia Post Note  Patient: Tamara [name]  Procedure(s) Performed: OPEN L4-5 TRANSFORAMINAL LUMBAR INTERBODY FUSION (TLIF) (Spine Lumbar)  Patient location during evaluation: PACU Anesthesia Type: General Level of consciousness: awake and alert Pain management: pain level controlled Vital Signs Assessment: post-procedure vital signs reviewed and stable Respiratory status: spontaneous breathing, nonlabored ventilation, respiratory function stable and patient connected to nasal cannula oxygen Cardiovascular status: blood pressure returned to baseline and stable Postop Assessment: no apparent nausea or vomiting Anesthetic complications: no   No notable events documented.   Last Vitals:  Vitals:   04/01/22 2104 04/02/22 0330  BP: (!) 100/55 112/60  Pulse: 100 87  Resp: 16 17  Temp: 37.2 C 36.8 C  SpO2: 97% 96%    Last Pain:  Vitals:   04/02/22 0506  TempSrc:   PainSc: 5                  Precious Haws Bazil Dhanani

## 2022-04-02 NOTE — Plan of Care (Signed)

## 2022-04-02 NOTE — Evaluation (Signed)
Occupational Therapy Evaluation Patient Details Name: Tamara [name] MRN: [id number] DOB: 1946/03/23 Today's Date: 04/02/2022   History of Present Illness Pt is a 76 y.o. female s/p L4-5 TLIF 04/01/22 (d/t increasing R leg pain with recurrent disc herniation L4-5).  PMH includes sleep apnea, COPD, CAD, hiatal hernia, HA's, DVT B LE's; amaurosis fugax, anxiety, carpal tunnel syndrome, COPD, Crohn's disease, dysphagia, facial twitching, intractable chronic migraine, sleep apnea, SBO, R L4-5 far lateral discectomy 11/06/21.   Clinical Impression   Tamara [name] was seen for OT evaluation this date. Prior to hospital admission, pt was Independent for mobility and ADLs. Pt lives alone with family available PRN. Pt presents to acute OT demonstrating impaired ADL performance and functional mobility 2/2 functional ROM deficits. Pt currently requires MIN A for LB access seated EOB - cues for use of bed for figure 4 position. MOD I don/doff brace in sitting. SUPERVISION for toilet t/f and pericare - no AD use. Good recall of log rolling technique. Instructed on functional application of spinal precautions. All education complete, will sign off. Upon hospital discharge, recommend follow up therapy per MD.    Recommendations for follow up therapy are one component of a multi-disciplinary discharge planning process, led by the attending physician.  Recommendations may be updated based on patient status, additional functional criteria and insurance authorization.   Follow Up Recommendations  Follow physician's recommendations for discharge plan and follow up therapies    Assistance Recommended at Discharge Set up Supervision/Assistance  Patient can return home with the following A little help with bathing/dressing/bathroom;Help with stairs or ramp for entrance    Functional Status Assessment  Patient has had a recent decline in their functional status and demonstrates the ability to make significant  improvements in function in a reasonable and predictable amount of time.  Equipment Recommendations  BSC/3in1    Recommendations for Other Services       Precautions / Restrictions Precautions Precautions: Back Precaution Comments: Apply/remove brace while sitting.  May remove brace when in bed.  May ambulate to bathroom without brace.  May remove brace to shower. Required Braces or Orthoses: Spinal Brace Spinal Brace: Lumbar corset;Applied in sitting position Restrictions Weight Bearing Restrictions: No      Mobility Bed Mobility Overal bed mobility: Needs Assistance Bed Mobility: Rolling, Sidelying to Sit, Sit to Sidelying Rolling: Supervision Sidelying to sit: Supervision     Sit to sidelying: Supervision      Transfers Overall transfer level: Modified independent Equipment used: None                      Balance Overall balance assessment: No apparent balance deficits (not formally assessed)                                         ADL either performed or assessed with clinical judgement   ADL Overall ADL's : Needs assistance/impaired                                       General ADL Comments: MIN A for LB access seated EOB - cues for use of bed for figure 4 position. MOD I don/doff brace in sitting. SUPERVISION for toilet t/f and pericare - no AD use.      Pertinent Vitals/Pain Pain Assessment Pain  Assessment: 0-10 Pain Score: 3  Pain Location: stomach Pain Descriptors / Indicators: Discomfort Pain Intervention(s): Limited activity within patient's tolerance, Repositioned     Hand Dominance     Extremity/Trunk Assessment Upper Extremity Assessment Upper Extremity Assessment: Overall WFL for tasks assessed   Lower Extremity Assessment Lower Extremity Assessment: Overall WFL for tasks assessed   Cervical / Trunk Assessment Cervical / Trunk Assessment: Normal   Communication Communication Communication:  No difficulties   Cognition Arousal/Alertness: Awake/alert Behavior During Therapy: WFL for tasks assessed/performed Overall Cognitive Status: Within Functional Limits for tasks assessed                                       General Comments  hemovac in place            Home Living Family/patient expects to be discharged to:: Private residence Living Arrangements: Alone Available Help at Discharge: Family;Available PRN/intermittently Type of Home: House Home Access: Ramped entrance     Home Layout: One level     Bathroom Shower/Tub: Walk-in shower (garden tub)   Bathroom Toilet: Handicapped height (grab bar in front of toilet)     Home Equipment: None          Prior Functioning/Environment Prior Level of Function : Independent/Modified Independent             Mobility Comments: Active.  1 fall since December surgery (got up too fast and was dizzy).          OT Problem List: Decreased strength;Decreased range of motion;Decreased knowledge of precautions         OT Goals(Current goals can be found in the care plan section) Acute Rehab OT Goals Patient Stated Goal: to go home OT Goal Formulation: With patient Time For Goal Achievement: 04/16/22 Potential to Achieve Goals: Good   AM-PAC OT "6 Clicks" Daily Activity     Outcome Measure Help from another person eating meals?: None Help from another person taking care of personal grooming?: None Help from another person toileting, which includes using toliet, bedpan, or urinal?: None Help from another person bathing (including washing, rinsing, drying)?: A Little Help from another person to put on and taking off regular upper body clothing?: None Help from another person to put on and taking off regular lower body clothing?: A Little 6 Click Score: 22   End of Session    Activity Tolerance: Patient tolerated treatment well Patient left: in bed;with call bell/phone within reach;with  nursing/sitter in room  OT Visit Diagnosis: Unsteadiness on feet (R26.81)                Time: 1030-1040 OT Time Calculation (min): 10 min Charges:  OT General Charges $OT Visit: 1 Visit OT Evaluation $OT Eval Low Complexity: 1 Low  Tamara [name], M.S. OTR/L  04/02/22, 2:01 PM  ascom [phone]

## 2022-04-02 NOTE — Evaluation (Signed)
Physical Therapy Evaluation Patient Details Name: Tamara [name] MRN: [id number] DOB: 11/15/1945 Today's Date: 04/02/2022  History of Present Illness  Pt is a 76 y.o. female s/p L4-5 TLIF 04/01/22 (d/t increasing R leg pain with recurrent disc herniation L4-5).  PMH includes sleep apnea, COPD, CAD, hiatal hernia, HA's, DVT B LE's; amaurosis fugax, anxiety, carpal tunnel syndrome, COPD, Crohn's disease, dysphagia, facial twitching, intractable chronic migraine, sleep apnea, SBO, R L4-5 far lateral discectomy 11/06/21.  Clinical Impression  Prior to surgery, pt was independent with functional mobility and active; lives alone in 1 level home with ramp to enter.  Currently pt is SBA with bed mobility (via logrolling); modified independent with transfers; and CGA to ambulate 200 feet (no AD use).  Mild back pain at rest beginning/end of session but increased to 8/10 with ambulation (although pt reporting it felt good to walk).  Reviewed spinal precautions with pt; pt able to donn/doff back brace with minimal cueing.  Pt would benefit from skilled PT to address noted impairments and functional limitations (see below for any additional details).  Upon hospital discharge, f/u therapies per MD recommendations.    Recommendations for follow up therapy are one component of a multi-disciplinary discharge planning process, led by the attending physician.  Recommendations may be updated based on patient status, additional functional criteria and insurance authorization.  Follow Up Recommendations Follow physician's recommendations for discharge plan and follow up therapies    Assistance Recommended at Discharge PRN  Patient can return home with the following  Assist for transportation    Equipment Recommendations None recommended by PT  Recommendations for Other Services       Functional Status Assessment Patient has had a recent decline in their functional status and demonstrates the ability to make  significant improvements in function in a reasonable and predictable amount of time.     Precautions / Restrictions Precautions Precautions: Back Precaution Comments: Apply/remove brace while sitting.  May remove brace when in bed.  May ambulate to bathroom without brace.  May remove brace to shower. Required Braces or Orthoses: Spinal Brace Spinal Brace: Lumbar corset;Applied in sitting position Restrictions Weight Bearing Restrictions: No      Mobility  Bed Mobility Overal bed mobility: Needs Assistance Bed Mobility: Rolling, Sidelying to Sit, Sit to Sidelying Rolling: Supervision Sidelying to sit: Supervision, HOB elevated     Sit to sidelying: Supervision, HOB elevated General bed mobility comments: vc's for logrolling technique and spinal precautions    Transfers Overall transfer level: Modified independent Equipment used: None               General transfer comment: steady safe transfers    Ambulation/Gait Ambulation/Gait assistance: Min guard Gait Distance (Feet): 200 Feet Assistive device: None Gait Pattern/deviations: Step-through pattern Gait velocity: mildly decreased     General Gait Details: very mildy unsteady at times but no loss of balance noted  Stairs            Wheelchair Mobility    Modified Rankin (Stroke Patients Only)       Balance Overall balance assessment: Needs assistance Sitting-balance support: No upper extremity supported, Feet supported Sitting balance-Leahy Scale: Good Sitting balance - Comments: steady sitting reaching within BOS   Standing balance support: No upper extremity supported, During functional activity Standing balance-Leahy Scale: Good                               Pertinent  Vitals/Pain Pain Assessment Pain Assessment: 0-10 Pain Score: 4  (4/10 at rest; 8/10 with activity) Pain Location: back Pain Descriptors / Indicators: Sore Pain Intervention(s): Limited activity within patient's  tolerance, Monitored during session, Repositioned Vitals (HR and O2 on room air) stable and WFL throughout treatment session.    Home Living Family/patient expects to be discharged to:: Private residence Living Arrangements: Alone Available Help at Discharge: Family;Available PRN/intermittently Type of Home: House Home Access: Ramped entrance       Home Layout: One level Home Equipment: None      Prior Function Prior Level of Function : Independent/Modified Independent             Mobility Comments: Active.  1 fall since December surgery (got up too fast and was dizzy).       Hand Dominance        Extremity/Trunk Assessment   Upper Extremity Assessment Upper Extremity Assessment: Overall WFL for tasks assessed    Lower Extremity Assessment Lower Extremity Assessment: Overall WFL for tasks assessed    Cervical / Trunk Assessment Cervical / Trunk Assessment: Normal  Communication   Communication: No difficulties  Cognition Arousal/Alertness: Awake/alert Behavior During Therapy: WFL for tasks assessed/performed Overall Cognitive Status: Within Functional Limits for tasks assessed                                          General Comments General comments (skin integrity, edema, etc.): hemovac in place.  Nursing cleared pt for participation in physical therapy.  Pt agreeable to PT session.    Exercises     Assessment/Plan    PT Assessment Patient needs continued PT services  PT Problem List Decreased strength;Decreased activity tolerance;Decreased balance;Decreased mobility;Decreased knowledge of precautions;Pain;Decreased skin integrity       PT Treatment Interventions DME instruction;Gait training;Functional mobility training;Therapeutic activities;Therapeutic exercise;Balance training;Patient/family education    PT Goals (Current goals can be found in the Care Plan section)  Acute Rehab PT Goals Patient Stated Goal: to go home PT  Goal Formulation: With patient Time For Goal Achievement: 04/16/22 Potential to Achieve Goals: Good    Frequency BID     Co-evaluation               AM-PAC PT "6 Clicks" Mobility  Outcome Measure Help needed turning from your back to your side while in a flat bed without using bedrails?: None Help needed moving from lying on your back to sitting on the side of a flat bed without using bedrails?: A Little Help needed moving to and from a bed to a chair (including a wheelchair)?: A Little Help needed standing up from a chair using your arms (e.g., wheelchair or bedside chair)?: A Little Help needed to walk in hospital room?: A Little Help needed climbing 3-5 steps with a railing? : A Little 6 Click Score: 19    End of Session Equipment Utilized During Treatment: Gait belt;Back brace Activity Tolerance: Patient tolerated treatment well Patient left: with call bell/phone within reach;with bed alarm set (sitting on edge of bed) Nurse Communication: Mobility status;Precautions PT Visit Diagnosis: Unsteadiness on feet (R26.81);Other abnormalities of gait and mobility (R26.89);Muscle weakness (generalized) (M62.81);Pain Pain - Right/Left:  (low back)    Time: 2706-2376 PT Time Calculation (min) (ACUTE ONLY): 24 min   Charges:   PT Evaluation $PT Eval Low Complexity: 1 Low PT Treatments $Therapeutic Activity: 8-22 mins  Leitha Bleak, PT 04/02/22, 1:30 PM

## 2022-04-02 NOTE — Progress Notes (Signed)
Met with the patient in the room She lives alone and her sons assist her She walks unassisted and does not want DME [facility] has accepted her for [facility] services Her son Darnelle Maffucci will provide transportation

## 2022-04-02 NOTE — Progress Notes (Signed)
    Attending Progress Note  History: Tamara [name] is here for lumbar radiculopathy s/p L4-5 TLIF.   POD1: Pt reports improved right leg pain post-op and expected low back pain  Physical Exam: Vitals:   04/01/22 2104 04/02/22 0330  BP: (!) 100/55 112/60  Pulse: 100 87  Resp: 16 17  Temp: 98.9 F (37.2 C) 98.3 F (36.8 C)  SpO2: 97% 96%    AA Ox3 CNI  Strength:5/5 throughout BLE HV 210 since surgery   Data:  Recent Labs  Lab 03/27/22 0934 04/01/22 1816  NA 140  --   K 3.9  --   CL 107  --   CO2 27  --   BUN 13  --   CREATININE 0.68 0.75  GLUCOSE 139*  --   CALCIUM 9.5  --    No results for input(s): AST, ALT, ALKPHOS in the last 168 hours.  Invalid input(s): TBILI   Recent Labs  Lab 03/27/22 0934 04/01/22 1816  WBC 9.7 7.6  HGB 13.1 12.2  HCT 39.8 36.9  PLT 254 236   Recent Labs  Lab 04/01/22 1158  INR 1.0         Other tests/results: none   Assessment/Plan:  Tamara [name] is a 76 y.o presenting with lumbar radiculopathy s/p L4-5 TLIF with improved leg pain  - mobilize - pain control - DVT prophylaxis - PTOT - HV to remain this morning will re-evaluate this afternoon  Cooper Render PA-C Department of Neurosurgery

## 2022-04-03 MED ORDER — OXYCODONE HCL 5 MG PO TABS
5.0000 mg | ORAL_TABLET | ORAL | 0 refills | Status: AC | PRN
Start: 2022-04-03 — End: 2022-04-08

## 2022-04-03 MED ORDER — FLEET ENEMA 7-19 GM/118ML RE ENEM
1.0000 | ENEMA | RECTAL | Status: AC
  Administered 2022-04-03: 1 via RECTAL

## 2022-04-03 MED ORDER — LACTULOSE 10 GM/15ML PO SOLN
20.0000 g | Freq: Once | ORAL | Status: AC
  Administered 2022-04-03: 20 g via ORAL
  Filled 2022-04-03: qty 30

## 2022-04-03 MED ORDER — METHOCARBAMOL 500 MG PO TABS: 500.0000 mg | ORAL_TABLET | Freq: Four times a day (QID) | ORAL | 0 refills | Status: DC | PRN

## 2022-04-03 NOTE — Progress Notes (Signed)
Patient was given verbal and written discharge instructions, she acknowledges understanding and states she will comply and keep all appointments.Patient taken to car by wheelchair, no distress noted when leaving the floor.

## 2022-04-03 NOTE — Discharge Instructions (Addendum)
NEUROSURGERY DISCHARGE INSTRUCTIONS  Admission diagnosis: S/P spinal fusion [Z98.1]  Operative procedure: L4-5 TLIF.   What to do after you leave the hospital:  Recommended diet: regular diet. Increase protein intake to promote wound healing.  Recommended activity: no lifting, driving, or strenuous exercise for 4 weeks . You should walk multiple times per day  Special Instructions  No straining, no heavy lifting > 10lbs x 4 weeks.  Keep incision area clean and dry. May shower in 2 days. No baths or pools for 6 weeks.  Please remove dressing tomorrow, no need to apply a bandage afterwards  You have sutures or staples that will be removed in clinic.   Please take pain medications as directed. Take a stool softener if on pain medications   Please Report any of the following: Nausea or Vomiting, Temperature is greater than 101.53F (38.1C) degrees, Dizziness, Abdominal Pain, Difficulty Breathing or Shortness of Breath, Inability to Eat, drink Fluids, or Take medications, Bleeding, swelling, or drainage from surgical incision sites, New numbness or weakness, and Bowel or bladder dysfunction to the neurosurgeon on call at [phone]  Additional Follow up appointments Please follow up with Cooper Render PA-C in [facility] as scheduled in 2-3 weeks   Please see below for scheduled appointments:  Future Appointments  Date Time Provider [place]  04/12/2022 10:15 AM [facility] None  04/12/2022 10:30 AM Earlie Server, MD [facility] None

## 2022-04-03 NOTE — Progress Notes (Signed)
    Attending Progress Note  History: Tamara [name] is here for lumbar radiculopathy s/p L4-5 TLIF.   POD2: pt reporting constipation  POD1: Pt reports improved right leg pain post-op and expected low back pain  Physical Exam: Vitals:   04/02/22 1948 04/03/22 0455  BP: 135/74 97/60  Pulse: 78 78  Resp: 17 18  Temp: 98.6 F (37 C) 98.1 F (36.7 C)  SpO2: 99% 100%    AA Ox3 CNI  Strength:5/5 throughout BLE HV 60 yesterday. Minimal output in drain this morning  Data:  Recent Labs  Lab 03/27/22 0934 04/01/22 1816  NA 140  --   K 3.9  --   CL 107  --   CO2 27  --   BUN 13  --   CREATININE 0.68 0.75  GLUCOSE 139*  --   CALCIUM 9.5  --     No results for input(s): AST, ALT, ALKPHOS in the last 168 hours.  Invalid input(s): TBILI   Recent Labs  Lab 03/27/22 0934 04/01/22 1816  WBC 9.7 7.6  HGB 13.1 12.2  HCT 39.8 36.9  PLT 254 236    Recent Labs  Lab 04/01/22 1158  INR 1.0          Other tests/results: none   Assessment/Plan:  Tamara [name] is a 76 y.o presenting with lumbar radiculopathy s/p L4-5 TLIF with improved leg pain  - mobilize - pain control - DVT prophylaxis - PTOT - will remove HV later today - please escalate bowel regimen  Cooper Render PA-C Department of Neurosurgery

## 2022-04-03 NOTE — Progress Notes (Signed)
Physical Therapy Treatment Patient Details Name: Tamara [name] MRN: [id number] DOB: April 17, 1946 Today's Date: 04/03/2022   History of Present Illness Pt is a 76 y.o. female s/p L4-5 TLIF 04/01/22 (d/t increasing R leg pain with recurrent disc herniation L4-5).  PMH includes sleep apnea, COPD, CAD, hiatal hernia, HA's, DVT B LE's; amaurosis fugax, anxiety, carpal tunnel syndrome, COPD, Crohn's disease, dysphagia, facial twitching, intractable chronic migraine, sleep apnea, SBO, R L4-5 far lateral discectomy 11/06/21.    PT Comments    Patient received in bed, she is requesting to go to the bathroom. Had been waiting for NT to assist with this. Patient is mod independent with bed mobility, transfers and supervision for hallway ambulation, no AD. She reports increased soreness this am. Patient will continue to benefit from skilled PT while here to improve functional independence.       Recommendations for follow up therapy are one component of a multi-disciplinary discharge planning process, led by the attending physician.  Recommendations may be updated based on patient status, additional functional criteria and insurance authorization.  Follow Up Recommendations  Follow physician's recommendations for discharge plan and follow up therapies     Assistance Recommended at Discharge PRN  Patient can return home with the following Assist for transportation;Assistance with cooking/housework   Equipment Recommendations  None recommended by PT    Recommendations for Other Services       Precautions / Restrictions Precautions Precautions: Back Precaution Comments: Apply/remove brace while sitting.  May remove brace when in bed.  May ambulate to bathroom without brace.  May remove brace to shower. Required Braces or Orthoses: Spinal Brace Spinal Brace: Lumbar corset;Applied in sitting position Restrictions Weight Bearing Restrictions: No     Mobility  Bed Mobility Overal bed mobility:  Modified Independent Bed Mobility: Rolling, Sidelying to Sit Rolling: Modified independent (Device/Increase time) Sidelying to sit: Modified independent (Device/Increase time)       General bed mobility comments: via logrolling technique    Transfers Overall transfer level: Modified independent Equipment used: None               General transfer comment: steady safe transfers    Ambulation/Gait Ambulation/Gait assistance: Supervision Gait Distance (Feet): 300 Feet Assistive device: None Gait Pattern/deviations: Step-through pattern, WFL(Within Functional Limits) Gait velocity: WNL     General Gait Details: very occasional mild unsteadiness but no loss of balance   Stairs             Wheelchair Mobility    Modified Rankin (Stroke Patients Only)       Balance Overall balance assessment: Mild deficits observed, not formally tested Sitting-balance support: Feet supported Sitting balance-Leahy Scale: Normal     Standing balance support: No upper extremity supported Standing balance-Leahy Scale: Good Standing balance comment: no loss of balance with ambulation                            Cognition Arousal/Alertness: Awake/alert Behavior During Therapy: WFL for tasks assessed/performed Overall Cognitive Status: Within Functional Limits for tasks assessed                                          Exercises      General Comments        Pertinent Vitals/Pain Pain Assessment Pain Assessment: Faces Faces Pain Scale: Hurts little more Facial Expression:  Relaxed, neutral Body Movements: Absence of movements Muscle Tension: Relaxed Pain Location: back Pain Descriptors / Indicators: Tightness, Sore, Discomfort Pain Intervention(s): Monitored during session    Home Living                          Prior Function            PT Goals (current goals can now be found in the care plan section) Acute Rehab PT  Goals Patient Stated Goal: to go home PT Goal Formulation: With patient Time For Goal Achievement: 04/16/22 Potential to Achieve Goals: Good Progress towards PT goals: Progressing toward goals    Frequency    BID      PT Plan Current plan remains appropriate    Co-evaluation              AM-PAC PT "6 Clicks" Mobility   Outcome Measure  Help needed turning from your back to your side while in a flat bed without using bedrails?: None Help needed moving from lying on your back to sitting on the side of a flat bed without using bedrails?: None Help needed moving to and from a bed to a chair (including a wheelchair)?: None Help needed standing up from a chair using your arms (e.g., wheelchair or bedside chair)?: None Help needed to walk in hospital room?: None Help needed climbing 3-5 steps with a railing? : A Little 6 Click Score: 23    End of Session Equipment Utilized During Treatment: Back brace Activity Tolerance: Patient tolerated treatment well Patient left: in bed;Other (comment) (PA in room to remove drain) Nurse Communication: Mobility status;Precautions PT Visit Diagnosis: Difficulty in walking, not elsewhere classified (R26.2);Pain Pain - part of body:  (back)     Time: 7116-5790 PT Time Calculation (min) (ACUTE ONLY): 8 min  Charges:  $Gait Training: 8-22 mins                     Jupiter Kabir, PT, GCS 04/03/22,9:57 AM

## 2022-04-03 NOTE — Discharge Summary (Signed)
Physician Discharge Summary  Patient ID: Tamara [name] MRN: [id number] DOB/AGE: 1946/02/04 76 y.o.  Admit date: 04/01/2022 Discharge date: 04/03/2022  Admission Diagnoses:  M43.10 anterolisthesis, M54.16 lumbar radiculopathy She failed conservative management  Discharge Diagnoses:  Principal Problem:   S/P spinal fusion   Discharged Condition: good  Hospital Course:  Tamara [name] is a 76 y.o s/p L4-5 TLIF. Her intraoperative course was uncomplicated. She was admitted for pain control, therapy and drain management. She reported resolution of her pre-op right leg pain. She was deemed appropriate for discharge home with home health. Her drain output reduced to an acceptable level and she was discharged home on POD2.  Consults: None  Significant Diagnostic Studies: none   Treatments: surgery: as above. Please see separately dictated operative report for further details   Discharge Exam: Blood pressure (!) 98/59, pulse 73, temperature 98.1 F (36.7 C), resp. rate 15, height 5' 3"  (1.6 m), weight 42.8 kg, SpO2 97 %. AA Ox3 CNI   Strength:5/5 throughout BLE Incision c/d/I with post-op staples in place.   Disposition: Discharge disposition: 06-Home-Health Care Svc       Discharge Instructions     Incentive spirometry RT   Complete by: As directed    Remove dressing in 24 hours   Complete by: As directed       Allergies as of 04/03/2022       Reactions   Eliquis [apixaban] Nausea Only   Dizziness   Sulfa Antibiotics Rash        Medication List     STOP taking these medications    warfarin 1 MG tablet Commonly known as: COUMADIN   warfarin 5 MG tablet Commonly known as: COUMADIN       TAKE these medications    clonazePAM 0.5 MG tablet Commonly known as: KLONOPIN TAKE 1 TABLET BY MOUTH ONCE DAILY AS NEEDED FOR ANXIETY   cyanocobalamin 1000 MCG/ML injection Commonly known as: (VITAMIN B-12) Inject 1 mL (1,000 mcg total) into the muscle every  30 (thirty) days.   methocarbamol 500 MG tablet Commonly known as: ROBAXIN Take 1 tablet (500 mg total) by mouth every 6 (six) hours as needed for muscle spasms.   nortriptyline 25 MG capsule Commonly known as: PAMELOR Take 1 capsule (25 mg total) by mouth at bedtime.   omeprazole 40 MG capsule Commonly known as: PRILOSEC TAKE 1 CAPSULE BY MOUTH TWICE DAILY What changed: when to take this   oxyCODONE 5 MG immediate release tablet Commonly known as: Oxy IR/ROXICODONE Take 1 tablet (5 mg total) by mouth every 3 (three) hours as needed for up to 5 days for moderate pain ((score 4 to 6)).   rosuvastatin 10 MG tablet Commonly known as: CRESTOR TAKE 1 TABLET BY MOUTH AT BEDTIME   sertraline 100 MG tablet Commonly known as: ZOLOFT TAKE 1 TABLET BY MOUTH ONCE DAILY What changed: when to take this   SYRINGE 3CC/25GX1" 25G X 1" 3 ML Misc Use for b12 injections   traZODone 50 MG tablet Commonly known as: DESYREL Take 2 tablets (100 mg total) by mouth at bedtime.        Follow-up Information     Loleta Dicker, PA Follow up in 2 week(s).   Why: For post-op and incision check. This appointment date and time should be listed on your pre-op paperwork Contact information: [place] [address] [phone]                 Signed:  Loleta Dicker 04/03/2022, 10:01 AM

## 2022-04-04 DIAGNOSIS — Z4789 Encounter for other orthopedic aftercare: Secondary | ICD-10-CM | POA: Diagnosis not present

## 2022-04-04 DIAGNOSIS — Z981 Arthrodesis status: Secondary | ICD-10-CM | POA: Diagnosis not present

## 2022-04-04 DIAGNOSIS — R2681 Unsteadiness on feet: Secondary | ICD-10-CM | POA: Diagnosis not present

## 2022-04-05 ENCOUNTER — Telehealth: Payer: Self-pay

## 2022-04-05 DIAGNOSIS — R2681 Unsteadiness on feet: Secondary | ICD-10-CM | POA: Diagnosis not present

## 2022-04-05 DIAGNOSIS — Z981 Arthrodesis status: Secondary | ICD-10-CM | POA: Diagnosis not present

## 2022-04-05 DIAGNOSIS — Z4789 Encounter for other orthopedic aftercare: Secondary | ICD-10-CM | POA: Diagnosis not present

## 2022-04-05 NOTE — Telephone Encounter (Signed)
Patient is scheduled for hospital follow up with Dr. Izora Ribas in the next 2 weeks. Will follow up with PCP as needed at a later date.

## 2022-04-09 ENCOUNTER — Encounter: Payer: Self-pay | Admitting: Neurosurgery

## 2022-04-09 DIAGNOSIS — Z981 Arthrodesis status: Secondary | ICD-10-CM | POA: Diagnosis not present

## 2022-04-09 DIAGNOSIS — R2681 Unsteadiness on feet: Secondary | ICD-10-CM | POA: Diagnosis not present

## 2022-04-09 DIAGNOSIS — Z4789 Encounter for other orthopedic aftercare: Secondary | ICD-10-CM | POA: Diagnosis not present

## 2022-04-11 DIAGNOSIS — R2681 Unsteadiness on feet: Secondary | ICD-10-CM | POA: Diagnosis not present

## 2022-04-11 DIAGNOSIS — Z4789 Encounter for other orthopedic aftercare: Secondary | ICD-10-CM | POA: Diagnosis not present

## 2022-04-11 DIAGNOSIS — Z981 Arthrodesis status: Secondary | ICD-10-CM | POA: Diagnosis not present

## 2022-04-12 ENCOUNTER — Inpatient Hospital Stay: Payer: Medicare Other | Admitting: Oncology

## 2022-04-12 ENCOUNTER — Inpatient Hospital Stay: Payer: Medicare Other

## 2022-04-15 DIAGNOSIS — Z981 Arthrodesis status: Secondary | ICD-10-CM | POA: Diagnosis not present

## 2022-04-15 DIAGNOSIS — R2681 Unsteadiness on feet: Secondary | ICD-10-CM | POA: Diagnosis not present

## 2022-04-15 DIAGNOSIS — Z4789 Encounter for other orthopedic aftercare: Secondary | ICD-10-CM | POA: Diagnosis not present

## 2022-04-19 ENCOUNTER — Ambulatory Visit: Payer: Medicare Other

## 2022-04-19 ENCOUNTER — Other Ambulatory Visit: Payer: Self-pay | Admitting: Neurosurgery

## 2022-04-19 ENCOUNTER — Other Ambulatory Visit: Payer: Self-pay

## 2022-04-19 ENCOUNTER — Emergency Department
Admission: EM | Admit: 2022-04-19 | Discharge: 2022-04-19 | Disposition: A | Discharge status: Home / Self Care | Payer: Medicare Other | Attending: Emergency Medicine | Admitting: Emergency Medicine

## 2022-04-19 ENCOUNTER — Emergency Department: Payer: Medicare Other

## 2022-04-19 DIAGNOSIS — R2681 Unsteadiness on feet: Secondary | ICD-10-CM | POA: Diagnosis not present

## 2022-04-19 DIAGNOSIS — M79604 Pain in right leg: Secondary | ICD-10-CM

## 2022-04-19 DIAGNOSIS — M7121 Synovial cyst of popliteal space [Baker], right knee: Secondary | ICD-10-CM | POA: Insufficient documentation

## 2022-04-19 DIAGNOSIS — Z7901 Long term (current) use of anticoagulants: Secondary | ICD-10-CM | POA: Insufficient documentation

## 2022-04-19 DIAGNOSIS — M25561 Pain in right knee: Secondary | ICD-10-CM | POA: Diagnosis present

## 2022-04-19 DIAGNOSIS — J449 Chronic obstructive pulmonary disease, unspecified: Secondary | ICD-10-CM | POA: Diagnosis not present

## 2022-04-19 DIAGNOSIS — Z87891 Personal history of nicotine dependence: Secondary | ICD-10-CM | POA: Insufficient documentation

## 2022-04-19 DIAGNOSIS — Z981 Arthrodesis status: Secondary | ICD-10-CM | POA: Diagnosis not present

## 2022-04-19 DIAGNOSIS — Z4789 Encounter for other orthopedic aftercare: Secondary | ICD-10-CM | POA: Diagnosis not present

## 2022-04-19 LAB — CBC
HCT: 36.1 % (ref 36.0–46.0)
Hemoglobin: 11.7 g/dL — ABNORMAL LOW (ref 12.0–15.0)
MCH: 32.9 pg (ref 26.0–34.0)
MCHC: 32.4 g/dL (ref 30.0–36.0)
MCV: 101.4 fL — ABNORMAL HIGH (ref 80.0–100.0)
Platelets: 388 10*3/uL (ref 150–400)
RBC: 3.56 MIL/uL — ABNORMAL LOW (ref 3.87–5.11)
RDW: 13.6 % (ref 11.5–15.5)
WBC: 9.5 10*3/uL (ref 4.0–10.5)
nRBC: 0 % (ref 0.0–0.2)

## 2022-04-19 LAB — COMPREHENSIVE METABOLIC PANEL
ALT: 20 U/L (ref 0–44)
AST: 27 U/L (ref 15–41)
Albumin: 4.4 g/dL (ref 3.5–5.0)
Alkaline Phosphatase: 60 U/L (ref 38–126)
Anion gap: 9 (ref 5–15)
BUN: 12 mg/dL (ref 8–23)
CO2: 24 mmol/L (ref 22–32)
Calcium: 9.3 mg/dL (ref 8.9–10.3)
Chloride: 106 mmol/L (ref 98–111)
Creatinine, Ser: 0.78 mg/dL (ref 0.44–1.00)
GFR, Estimated: >60 mL/min (ref >60)
Glucose, Bld: 97 mg/dL (ref 70–99)
Potassium: 4.2 mmol/L (ref 3.5–5.1)
Sodium: 139 mmol/L (ref 135–145)
Total Bilirubin: 0.6 mg/dL (ref 0.3–1.2)
Total Protein: 6.7 g/dL (ref 6.5–8.1)

## 2022-04-19 LAB — PROTIME-INR
INR: 1 (ref 0.8–1.2)
Prothrombin Time: 13.4 seconds (ref 11.4–15.2)

## 2022-04-19 MED ORDER — OXYCODONE-ACETAMINOPHEN 5-325 MG PO TABS
1.0000 | ORAL_TABLET | Freq: Once | ORAL | Status: AC
  Administered 2022-04-19: 1 via ORAL
  Filled 2022-04-19: qty 1

## 2022-04-19 NOTE — Discharge Instructions (Signed)
You have been seen today in the emergency room for pain behind your right knee into your right calf.  He had an ultrasound of the right lower extremity.  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  You will need to follow-up with orthopedics.  I have provided the name for an orthopedic Dr. Rudene Christians within the [facility] [facility] system.  Please call his office and make an appointment at next available time with your convenience.  As you are already taking oxycodone for the back surgery you can continue to use this for the Baker's cyst pain as well.

## 2022-04-19 NOTE — ED Provider Notes (Signed)
[facility] Emergency Department Provider Note   ____________________________________________   Event Date/Time   First MD Initiated Contact with Patient 04/19/22 1918     (approximate)  I have reviewed the triage vital signs and the nursing notes.   HISTORY  Chief Complaint Knee Pain    HPI Tamara [name] is a 76 y.o. female reports to the emergency room for complaint of pain in the her aspect of right knee into the right calf that has worsened over the last 12 to 24 hours.  The pain has been present for the past 7 days. Patient had surgery approximately 2 weeks ago on L4-5 TLIF.  She had follow-up with neurosurgery today and they had ordered an urgent ultrasound.  However, when patient got home she felt that the pain had increased to the point that she needed to come to the emergency room that she reported here. Patient reports that she does have a history of blood clots and this feels the same. Neurosurgery did restart her on her Coumadin 5 mg yesterday. She is complaining of pain to the posterior aspect of the right knee down into the right calf.  On exam there is no redness/warmth/swelling however she is tender to palpation.  Peripheral pulses are normal.  She has no increased pain with flexion or extension of the foot.  However, she does have pain with flexion at the knee.  She is currently taking oxycodone 2-3 times a day for pain for the back surgery.  However she states this is not helping with the pain in her leg.  Past Medical History:  Diagnosis Date   Acute deep vein thrombosis (DVT) of left lower extremity ([place]) 05/2021   Acute deep vein thrombosis (DVT) of right lower extremity ([place]) 01/2007   a.) occurred postoperatively; s/p IVC filter placement   Acute deep vein thrombosis (DVT) of right lower extremity ([place]) 04/24/2018   Amaurosis fugax 12/13/2014    Brain MRA suggested vertebral artery partial occlusion   75 to 90% stenosed. Will treat  for prevention of embolic stroke,  Refer to [facility] for CTA and stent and refer to cardiology for ECHO    Anxiety    a.) on BZO (clonazepam) PRN   Aortic atherosclerosis ([place])    Arthritis    B12 deficiency    Carpal tunnel syndrome    Cataracts, bilateral    Clostridium difficile infection 05/24/2021   COPD (chronic obstructive pulmonary disease) ([place])    no inhalers   Crohn's disease ([place])    Depression    Dysphagia 12/04/2017   Facial twitching 07/11/2018   GERD (gastroesophageal reflux disease)    History of cardiac catheterization 06/14/1997   a.) LHC 06/14/1997 at [facility]: EF 60%; normal coronaries; no obstructive CAD.   History of hiatal hernia    History of kidney stones    Hx of splenectomy    a.) reports spleen was "ruptured" during routine colonoscopy in [place], which necessitated surgical intervention   Intractable chronic migraine without aura and without status migrainosus 05/20/2019   Leukocytosis    Long term current use of anticoagulant    a.) warfarin   Moderate tricuspid insufficiency    MRSA colonization 12/2016   Osteoporosis    Pneumonia    Sleep apnea    Small bowel obstruction ([place])    a.) 2001, 2004 (reversal of jejunal bypass). b.) s/p laparotomy and lysis of adhesions 01/2007   Spinal stenosis of lumbar region  Vitamin D deficiency     Patient Active Problem List   Diagnosis Date Noted   S/P spinal fusion 04/01/2022   Apraxia 03/31/2022   Pain in right arm 12/24/2021   Skin tear of forearm without complication, right, sequela 12/16/2021   Dog bite 12/06/2021   Anticoagulated on Coumadin 07/06/2021   Anemia 06/10/2021   Generalized anxiety disorder 05/06/2021   Cerebrovascular disease 07/31/2020   Aortic atherosclerosis ([place]) 07/19/2020   Coronary atherosclerosis due to calcified coronary lesion 07/19/2020   Emphysema with chronic bronchitis ([place]) 01/28/2020   OSA (obstructive sleep apnea) 08/01/2019   GERD (gastroesophageal  reflux disease) 04/28/2019   Insomnia due to anxiety and fear 07/11/2018   History of repair of laceration 04/28/2018   Malnutrition ([place]) 02/20/2018   Reflux esophagitis 01/24/2018   Macrocytosis 12/04/2017   Primary osteoarthritis of left knee 06/24/2017   Spinal stenosis, lumbar region, with neurogenic claudication 11/08/2016   Polycythemia, secondary 10/24/2016   Medicare annual wellness visit, subsequent 04/16/2016   Underweight 08/27/2015   Vitamin D deficiency 08/27/2015   Pain in both upper extremities 08/24/2015   Multiple skin tears 03/24/2015   Uric acid arthropathy 10/08/2014   Carpal tunnel syndrome 03/08/2014   B12 deficiency 01/26/2014   History of tobacco abuse 01/26/2014   Encounter for preventive health examination 05/11/2013   Steroid-induced osteoporosis 11/19/2011   Chronic abdominal pain    H/O small bowel obstruction    Crohn's disease of intestine, with intestinal obstruction ([place])    History of DVT (deep vein thrombosis) 01/10/2007    Past Surgical History:  Procedure Laterality Date   ANAL SPHINCTER PROSTHESIS PLACEMENT     APPENDECTOMY     CARDIAC CATHETERIZATION Left 06/14/1997   Procedure: CARDIAC CATHETERIZATION; Location: [facility]; Surgeon: Reynolds Bowl, MD   CARPAL TUNNEL RELEASE Right 04/22/2014   CARPAL TUNNEL RELEASE Left    CATARACT EXTRACTION Bilateral    CERVICAL FUSION  2005   C6-7, anterior approach   CHOLECYSTECTOMY     COLONOSCOPY     2005, 2007, 2010, 2015, 2020, 2021   COLONOSCOPY WITH PROPOFOL N/A 05/10/2019   Procedure: COLONOSCOPY WITH PROPOFOL;  Surgeon: Manya Silvas, MD;  Location: [facility] ENDOSCOPY;  Service: Endoscopy;  Laterality: N/A;   ESOPHAGEAL MANOMETRY N/A 02/11/2018   Procedure: ESOPHAGEAL MANOMETRY (EM);  Surgeon: Lucilla Lame, MD;  Location: [facility] ENDOSCOPY;  Service: Endoscopy;  Laterality: N/A;   ESOPHAGOGASTRODUODENOSCOPY     1996, 2006, 2008, 2010, 2014, 2018, 2021   ESOPHAGOGASTRODUODENOSCOPY (EGD) WITH  PROPOFOL N/A 12/17/2017   Procedure: ESOPHAGOGASTRODUODENOSCOPY (EGD) WITH PROPOFOL;  Surgeon: Robert Bellow, MD;  Location: [facility] ENDOSCOPY;  Service: Endoscopy;  Laterality: N/A;   ESOPHAGOGASTRODUODENOSCOPY (EGD) WITH PROPOFOL N/A 01/08/2018   Procedure: ESOPHAGOGASTRODUODENOSCOPY (EGD) WITH PROPOFOL;  Surgeon: Robert Bellow, MD;  Location: [facility] ENDOSCOPY;  Service: Endoscopy;  Laterality: N/A;   EXCISION NEUROMA Left 09/18/2021   Procedure: Excision of traumatic neuroma infrapatellar branch saphenous nerve left knee.;  Surgeon: Corky Mull, MD;  Location: [facility] ORS;  Service: Orthopedics;  Laterality: Left;   GIVENS CAPSULE STUDY     2005, 2006   INSERTION OF VENA CAVA FILTER Right    KNEE ARTHROSCOPY WITH MEDIAL MENISECTOMY Left 06/22/2020   Procedure: Left knee arthroscopic partial medial meniscectomy;  Surgeon: Leim Fabry, MD;  Location: [place];  Service: Orthopedics;  Laterality: Left;   KNEE ARTHROSCOPY WITH MEDIAL MENISECTOMY Left 05/25/2021   Procedure: Left knee arthroscopy, infrapatellar fat pad excision;  Surgeon: Leim Fabry, MD;  Location: [facility] ORS;  Service: Orthopedics;  Laterality: Left;   LAPAROTOMY  01/30/2007   for bowel obstruction with LOA   LUMBAR LAMINECTOMY/DECOMPRESSION MICRODISCECTOMY N/A 12/25/2016   Procedure: LUMBAR DECOMPRESSION L4-5;  Surgeon: Meade Maw, MD;  Location: [facility] ORS;  Service: Neurosurgery;  Laterality: N/A;   NISSEN FUNDOPLICATION     ROTATOR CUFF REPAIR Bilateral    SPLENECTOMY     secondary to colonoscopy   TONSILLECTOMY AND ADENOIDECTOMY     TRANSFORAMINAL LUMBAR INTERBODY FUSION (TLIF) WITH PEDICLE SCREW FIXATION 1 LEVEL N/A 04/01/2022   Procedure: OPEN L4-5 TRANSFORAMINAL LUMBAR INTERBODY FUSION (TLIF);  Surgeon: Meade Maw, MD;  Location: [facility] ORS;  Service: Neurosurgery;  Laterality: N/A;   TRIGGER FINGER RELEASE Left 08/25/2015   TRIGGER FINGER RELEASE Right 09/18/2015   VAGINAL HYSTERECTOMY       Prior to Admission medications   Medication Sig Start Date End Date Taking? Authorizing Provider  clonazePAM (KLONOPIN) 0.5 MG tablet TAKE 1 TABLET BY MOUTH ONCE DAILY AS NEEDED FOR ANXIETY 03/27/22   Crecencio Mc, MD  cyanocobalamin (,VITAMIN B-12,) 1000 MCG/ML injection Inject 1 mL (1,000 mcg total) into the muscle every 30 (thirty) days. 12/06/21   Crecencio Mc, MD  methocarbamol (ROBAXIN) 500 MG tablet Take 1 tablet (500 mg total) by mouth every 6 (six) hours as needed for muscle spasms. 04/03/22   Loleta Dicker, PA  nortriptyline (PAMELOR) 25 MG capsule Take 1 capsule (25 mg total) by mouth at bedtime. 12/06/21   Crecencio Mc, MD  omeprazole (PRILOSEC) 40 MG capsule TAKE 1 CAPSULE BY MOUTH TWICE DAILY Patient taking differently: Take 40 mg by mouth at bedtime. 11/08/21   Crecencio Mc, MD  rosuvastatin (CRESTOR) 10 MG tablet TAKE 1 TABLET BY MOUTH AT BEDTIME 10/25/21   Crecencio Mc, MD  sertraline (ZOLOFT) 100 MG tablet TAKE 1 TABLET BY MOUTH ONCE DAILY Patient taking differently: Take 100 mg by mouth at bedtime. 10/22/21   Crecencio Mc, MD  Syringe/Needle, Disp, (SYRINGE 3CC/25GX1") 25G X 1" 3 ML MISC Use for b12 injections 01/29/21   Crecencio Mc, MD  traZODone (DESYREL) 50 MG tablet Take 2 tablets (100 mg total) by mouth at bedtime. 12/27/21   Crecencio Mc, MD    Allergies Eliquis [apixaban] and Sulfa antibiotics  Family History  Problem Relation Age of Onset   Diabetes Mother        type 2   Heart disease Mother    Hypertension Mother    Coronary artery disease Father    Heart attack Sister     Social History Social History   Tobacco Use   Smoking status: Former    Packs/day: 1.00    Years: 50.00    Total pack years: 50.00    Types: Cigarettes    Quit date: 02/21/2022    Years since quitting: 0.1   Smokeless tobacco: Never  Vaping Use   Vaping Use: Never used  Substance Use Topics   Alcohol use: No    Alcohol/week: 0.0 standard drinks of  alcohol   Drug use: No    Review of Systems  Constitutional: No fever/chills Eyes: No visual changes. ENT: No sore throat. Cardiovascular: Denies chest pain. Respiratory: Denies shortness of breath. Gastrointestinal: No abdominal pain.  No nausea, no vomiting.  No diarrhea.  No constipation. Genitourinary: Negative for dysuria. Musculoskeletal: Positive for right leg pain. Skin: Negative for rash. Neurological: Negative for headaches, focal weakness or numbness.   ____________________________________________  PHYSICAL EXAM:  VITAL SIGNS: ED Triage Vitals  Enc Vitals Group     BP 04/19/22 1859 131/72     Pulse Rate 04/19/22 1859 (!) 114     Resp 04/19/22 1859 20     Temp 04/19/22 1859 98.2 F (36.8 C)     Temp Source 04/19/22 1859 Oral     SpO2 04/19/22 1859 96 %     Weight 04/19/22 1858 91 lb (41.3 kg)     Height 04/19/22 1858 5' 3"  (1.6 m)     Head Circumference --      Peak Flow --      Pain Score 04/19/22 1859 9     Pain Loc --      Pain Edu? --      Excl. in [place]? --     Constitutional: Alert and oriented. Well appearing and in no acute distress. Eyes: Conjunctivae are normal. PERRL. EOMI. Head: Atraumatic. Nose: No congestion/rhinnorhea. Mouth/Throat: Mucous membranes are moist.  Oropharynx non-erythematous. Neck: No stridor.   Cardiovascular: Patient is tachycardic regular rhythm. Grossly normal heart sounds.  Good peripheral circulation. Respiratory: Normal respiratory effort.  No retractions. Lungs CTAB. Gastrointestinal: Soft and nontender. No distention. No abdominal bruits. No CVA tenderness. Musculoskeletal: Pain to the posterior aspect of right knee radiating into the right calf.  Pain is worsened with flexion at the knee.  However she has negative Homans' sign.  Peripheral pulses are normal.  There is no redness/swelling/warmth to the right leg. Neurologic:  Normal speech and language. No gross focal neurologic deficits are appreciated. No gait  instability. Skin:  Skin is warm, dry and intact. No rash noted. Psychiatric: Mood and affect are normal. Speech and behavior are normal.  ____________________________________________   LABS (all labs ordered are listed, but only abnormal results are displayed)  Labs Reviewed  CBC - Abnormal; Notable for the following components:      Result Value   RBC 3.56 (*)    Hemoglobin 11.7 (*)    MCV 101.4 (*)    All other components within normal limits  COMPREHENSIVE METABOLIC PANEL  PROTIME-INR   ____________________________________________  EKG   ____________________________________________  RADIOLOGY  ED MD interpretation: Ultrasound was reviewed by me and read by radiologist.  Official radiology report(s): US Venous Img Lower Unilateral Right  Result Date: 04/19/2022 CLINICAL DATA:  Right leg pain.  Recent surgery.  History of DVT. EXAM: RIGHT LOWER EXTREMITY VENOUS DOPPLER ULTRASOUND TECHNIQUE: Gray-scale sonography with compression, as well as color and duplex ultrasound, were performed to evaluate the deep venous system(s) from the level of the common femoral vein through the popliteal and proximal calf veins. COMPARISON:  Right lower extremity Doppler ultrasound 07/21/2007 FINDINGS: VENOUS Normal compressibility of the common femoral, superficial femoral, and popliteal veins, as well as the visualized calf veins. Visualized portions of profunda femoral vein and great saphenous vein unremarkable. No filling defects to suggest DVT on grayscale or color Doppler imaging. Doppler waveforms show normal direction of venous flow, normal respiratory plasticity and response to augmentation. Limited views of the contralateral common femoral vein are unremarkable. OTHER 5.4 x 1.7 x 3.8 cm cyst in the right popliteal fossa. Limitations: none IMPRESSION: 1. No evidence of deep venous thrombosis in the right lower extremity. 2. 5.4 cm right Baker's cyst. Electronically Signed   By: Logan Bores  M.D.   On: 04/19/2022 20:55    ____________________________________________   PROCEDURES  Procedure(s) performed: None  Procedures  Critical Care performed: No  ____________________________________________  INITIAL IMPRESSION / ASSESSMENT AND PLAN / ED COURSE   Tamara [name] is a 76 y.o. female reports to the emergency room for complaint of pain in the her aspect of right knee into the right calf that has worsened over the last 12 to 24 hours.  The pain has been present for the past 7 days. Patient had surgery approximately 2 weeks ago on L4-5 TLIF.  She had follow-up with neurosurgery today and they had ordered an urgent ultrasound.  However, when patient got home she felt that the pain had increased to the point that she needed to come to the emergency room that she reported here. Patient reports that she does have a history of blood clots and this feels the same. Neurosurgery did restart her on her Coumadin 5 mg yesterday. She is complaining of pain to the posterior aspect of the right knee down into the right calf.  On exam there is no redness/warmth/swelling however she is tender to palpation.  Peripheral pulses are normal.  She has no increased pain with flexion or extension of the foot.  However, she does have pain with flexion at the knee.  She is currently taking oxycodone 2-3 times a day for pain for the back surgery.  However she states this is not helping with the pain in her leg.  Will order 1 Percocet for patient for pain at this time. Will order venous ultrasound of the right leg.  Ultrasound results reveal no signs of DVT.  However, there is a 5.4 cm Baker's cyst noted. I have discussed findings of ultrasound with patient and answered all her questions.  Patient is referred to orthopedics and information to make appointment is given to her in discharge paperwork. Patient is currently already on oxycodone for back surgery.  I explained to her that this pain  medication is stronger than what she would normally receive for a Baker's cyst therefore I will not be prescribing her further pain medicine at this time. Patient reports that she has a follow-up with her back surgeon/orthopedics on next Tuesday and will discuss the Baker's cyst with him at that time.  At this time patient will be discharged in stable condition with strict return precautions discussed.      ____________________________________________   FINAL CLINICAL IMPRESSION(S) / ED DIAGNOSES  Final diagnoses:  Baker's cyst of knee, right     ED Discharge Orders     None        Note:  This document was prepared using Dragon voice recognition software and may include unintentional dictation errors.     Willaim Rayas, NP 04/19/22 2145    Duffy Bruce, MD 04/22/22 [phone]

## 2022-04-19 NOTE — ED Triage Notes (Signed)
Pt states that she has been having pain and swelling behind her R knee x1 week- pt thinks she has a blood clot in it- pt has a hx of blood clots in the past and states this feels similar

## 2022-04-19 NOTE — ED Notes (Signed)
Urine sent to lab with bloodwork

## 2022-04-22 ENCOUNTER — Inpatient Hospital Stay: Payer: Medicare Other

## 2022-04-22 ENCOUNTER — Encounter: Payer: Self-pay | Admitting: Oncology

## 2022-04-22 ENCOUNTER — Ambulatory Visit: Payer: Medicare Other

## 2022-04-22 ENCOUNTER — Inpatient Hospital Stay: Payer: Medicare Other | Attending: Oncology | Admitting: Oncology

## 2022-04-22 VITALS — BP 117/64 | HR 115 | Temp 98.4°F | Ht 63.0 in | Wt 92.0 lb

## 2022-04-22 DIAGNOSIS — K219 Gastro-esophageal reflux disease without esophagitis: Secondary | ICD-10-CM | POA: Diagnosis not present

## 2022-04-22 DIAGNOSIS — Z8719 Personal history of other diseases of the digestive system: Secondary | ICD-10-CM

## 2022-04-22 DIAGNOSIS — F17210 Nicotine dependence, cigarettes, uncomplicated: Secondary | ICD-10-CM | POA: Diagnosis not present

## 2022-04-22 DIAGNOSIS — D72829 Elevated white blood cell count, unspecified: Secondary | ICD-10-CM | POA: Insufficient documentation

## 2022-04-22 DIAGNOSIS — Z9049 Acquired absence of other specified parts of digestive tract: Secondary | ICD-10-CM | POA: Insufficient documentation

## 2022-04-22 DIAGNOSIS — Z79899 Other long term (current) drug therapy: Secondary | ICD-10-CM | POA: Diagnosis not present

## 2022-04-22 DIAGNOSIS — Z86718 Personal history of other venous thrombosis and embolism: Secondary | ICD-10-CM | POA: Insufficient documentation

## 2022-04-22 DIAGNOSIS — Z7901 Long term (current) use of anticoagulants: Secondary | ICD-10-CM | POA: Insufficient documentation

## 2022-04-22 DIAGNOSIS — Z720 Tobacco use: Secondary | ICD-10-CM | POA: Diagnosis not present

## 2022-04-22 DIAGNOSIS — K5000 Crohn's disease of small intestine without complications: Secondary | ICD-10-CM | POA: Insufficient documentation

## 2022-04-22 NOTE — Progress Notes (Signed)
Hematology/Oncology Progress note [phone] [phone]      Patient Care Team: Crecencio Mc, MD as PCP - General (Internal Medicine)  REFERRING PROVIDER: Crecencio Mc, MD  CHIEF COMPLAINTS/REASON FOR VISIT:  Reestablish care for lower extremity DVT  HISTORY OF PRESENTING ILLNESS:  Tamara [name] is a  76 y.o.  female with PMH listed below who was seen at the request of Crecencio Mc, MD/[place]ull[place], Aris Everts, MD for evaluation of leukocytosis.   Reviewed patient' recent labs ordered by [facility] clinic gastroenterology group via care everywhere. 05/26/2019 CBC showed elevated white count of 13.3, predominately lymphocytosis with increased lymphocyte percentage 50.6, absolute lymphocyte 6.73. A pathology review was done and the result was not available to me. Other studies includes iron panel showed iron saturation 20.  Ferritin 11 Nonremarkable CMP, TSH 2.23 Previous lab records reviewed. Leukocytosis onset of chronic, duration is since  Vitamin D level 21.5, CRP< 1 Vitamin B12 level was low.  Patient started B12 injections. Patient reports that she has been chronically having high white blood cell count.  Recalled that she had work-up over a decade ago.  Details unclear. Patient has a history of gastroesophageal reflux disease, history of Crohn's disease of the small intestine diagnosed with capsule endoscopy in 2005.  History of bowel obstructions.  She has been in remission since 2016 currently off any Crohn's disease medication.  Most recent colonoscopy 05/10/2019 by Dr. Vira Agar showed polyps in the descending colon and the rectum which were removed.  Pathology showed sessile serrated polyp and hyperplastic polyp.  Negative for dysplasia, malignancy.  Smoking history: Current daily smoker, 50-pack-year smoking history. History of recent oral steroid use or steroid injection:  History of recent infection: Denies any recent infection. Autoimmune disease  history.  Crohn's disease  11/13/2019 CT entero-ABD/pelvis showed findings consistent with potential proctocolitis with rectal and distal colonic mural stratification and mild mucosal hyperenhancement.  Postoperative changes of the right gluteal region.  Persistent biliary ductal distention alternating in severity.  Post partial gastrectomy and splenectomy No bowel obstruction but with distended loop of bowel adjacent to anastomosis likely related to anastomotic changes.  Right iliac vein system with diminutive appearance which may relate to prior DVT.  Aortic atherosclerosis. 07/14/2000, chest lung cancer screening showed lung RADS 2 benign appearance.   INTERVAL HISTORY Tamara [name] is a 76 y.o. female who has above history reviewed by me today presents to reestablish care for history of DVT, chronic anticoagulation.  Patient was previously seen by me in 2021 for leukocytosis which was felt to be secondary to Crohn's disease. Patient was discharged Today she presents to reestablish care for evaluation of history of DVT She is currently on Coumadin.  #06/01/2021, patient is status post left knee arthroscopic partial synovectomy with infrapatellar fat pad debridement  Postoperatively, patient developed left lower extremity peroneal vein DVT. Patient was started on Eliquis 10 mg twice daily for 7 days, and patient was not able to tolerate due to nausea and dizziness.  Patient's anticoagulation was switched to Coumadin which she has been tolerating.  Patient also reports a remote history of right lower extremity DVT after laparotomy on 01/19/2007.  I reviewed her chart 01/29/2007, extensive deep vein thrombosis in right lower extremity, include the popliteal, superficial femoral and  common femoral veins. There is also thrombus in the proximal greater saphenous  03/30/2022, patient recently underwent lumbar spine transforaminal lumbar interbody fusion. Patient denies any bleeding events.  Denies  any shortness of breath, lower  extremity swelling currently.  Review of Systems  Constitutional:  Positive for fatigue. Negative for appetite change, chills, fever and unexpected weight change.  HENT:   Negative for hearing loss and voice change.   Eyes:  Negative for eye problems.  Respiratory:  Negative for chest tightness and cough.   Cardiovascular:  Negative for chest pain.  Gastrointestinal:  Negative for abdominal distention, abdominal pain and blood in stool.       Chronic epigastric discomfort/acid reflux  Endocrine: Negative for hot flashes.  Genitourinary:  Negative for difficulty urinating and frequency.   Musculoskeletal:  Positive for back pain.       Lumbar fusion  Skin:  Negative for itching and rash.  Neurological:  Negative for extremity weakness.  Hematological:  Negative for adenopathy.  Psychiatric/Behavioral:  Negative for confusion.      MEDICAL HISTORY:  Past Medical History:  Diagnosis Date   Acute deep vein thrombosis (DVT) of left lower extremity ([place]) 05/2021   Acute deep vein thrombosis (DVT) of right lower extremity ([place]) 01/2007   a.) occurred postoperatively; s/p IVC filter placement   Acute deep vein thrombosis (DVT) of right lower extremity ([facility]) 04/24/2018   Amaurosis fugax 12/13/2014    Brain MRA suggested vertebral artery partial occlusion   75 to 90% stenosed. Will treat for prevention of embolic stroke,  Refer to [facility] for CTA and stent and refer to cardiology for ECHO    Anxiety    a.) on BZO (clonazepam) PRN   Aortic atherosclerosis ([place])    Arthritis    B12 deficiency    Carpal tunnel syndrome    Cataracts, bilateral    Clostridium difficile infection 05/24/2021   COPD (chronic obstructive pulmonary disease) ([place])    no inhalers   Crohn's disease ([place])    Depression    Dysphagia 12/04/2017   Facial twitching 07/11/2018   GERD (gastroesophageal reflux disease)    History of cardiac catheterization 06/14/1997    a.) LHC 06/14/1997 at [facility]: EF 60%; normal coronaries; no obstructive CAD.   History of hiatal hernia    History of kidney stones    Hx of splenectomy    a.) reports spleen was "ruptured" during routine colonoscopy in [place], which necessitated surgical intervention   Intractable chronic migraine without aura and without status migrainosus 05/20/2019   Leukocytosis    Long term current use of anticoagulant    a.) warfarin   Moderate tricuspid insufficiency    MRSA colonization 12/2016   Osteoporosis    Pneumonia    Sleep apnea    Small bowel obstruction ([place])    a.) 2001, 2004 (reversal of jejunal bypass). b.) s/p laparotomy and lysis of adhesions 01/2007   Spinal stenosis of lumbar region    Vitamin D deficiency     SURGICAL HISTORY: Past Surgical History:  Procedure Laterality Date   ANAL SPHINCTER PROSTHESIS PLACEMENT     APPENDECTOMY     CARDIAC CATHETERIZATION Left 06/14/1997   Procedure: CARDIAC CATHETERIZATION; Location: [facility]; Surgeon: Reynolds Bowl, MD   CARPAL TUNNEL RELEASE Right 04/22/2014   CARPAL TUNNEL RELEASE Left    CATARACT EXTRACTION Bilateral    CERVICAL FUSION  2005   C6-7, anterior approach   CHOLECYSTECTOMY     COLONOSCOPY     2005, 2007, 2010, 2015, 2020, 2021   COLONOSCOPY WITH PROPOFOL N/A 05/10/2019   Procedure: COLONOSCOPY WITH PROPOFOL;  Surgeon: Manya Silvas, MD;  Location: [facility] ENDOSCOPY;  Service: Endoscopy;  Laterality: N/A;  ESOPHAGEAL MANOMETRY N/A 02/11/2018   Procedure: ESOPHAGEAL MANOMETRY (EM);  Surgeon: Lucilla Lame, MD;  Location: [facility] ENDOSCOPY;  Service: Endoscopy;  Laterality: N/A;   ESOPHAGOGASTRODUODENOSCOPY     1996, 2006, 2008, 2010, 2014, 2018, 2021   ESOPHAGOGASTRODUODENOSCOPY (EGD) WITH PROPOFOL N/A 12/17/2017   Procedure: ESOPHAGOGASTRODUODENOSCOPY (EGD) WITH PROPOFOL;  Surgeon: Robert Bellow, MD;  Location: [facility] ENDOSCOPY;  Service: Endoscopy;  Laterality: N/A;   ESOPHAGOGASTRODUODENOSCOPY (EGD) WITH PROPOFOL N/A  01/08/2018   Procedure: ESOPHAGOGASTRODUODENOSCOPY (EGD) WITH PROPOFOL;  Surgeon: Robert Bellow, MD;  Location: [facility] ENDOSCOPY;  Service: Endoscopy;  Laterality: N/A;   EXCISION NEUROMA Left 09/18/2021   Procedure: Excision of traumatic neuroma infrapatellar branch saphenous nerve left knee.;  Surgeon: Corky Mull, MD;  Location: [facility] ORS;  Service: Orthopedics;  Laterality: Left;   GIVENS CAPSULE STUDY     2005, 2006   INSERTION OF VENA CAVA FILTER Right    KNEE ARTHROSCOPY WITH MEDIAL MENISECTOMY Left 06/22/2020   Procedure: Left knee arthroscopic partial medial meniscectomy;  Surgeon: Leim Fabry, MD;  Location: [facility];  Service: Orthopedics;  Laterality: Left;   KNEE ARTHROSCOPY WITH MEDIAL MENISECTOMY Left 05/25/2021   Procedure: Left knee arthroscopy, infrapatellar fat pad excision;  Surgeon: Leim Fabry, MD;  Location: [facility] ORS;  Service: Orthopedics;  Laterality: Left;   LAPAROTOMY  01/30/2007   for bowel obstruction with LOA   LUMBAR LAMINECTOMY/DECOMPRESSION MICRODISCECTOMY N/A 12/25/2016   Procedure: LUMBAR DECOMPRESSION L4-5;  Surgeon: Meade Maw, MD;  Location: [facility] ORS;  Service: Neurosurgery;  Laterality: N/A;   NISSEN FUNDOPLICATION     ROTATOR CUFF REPAIR Bilateral    SPLENECTOMY     secondary to colonoscopy   TONSILLECTOMY AND ADENOIDECTOMY     TRANSFORAMINAL LUMBAR INTERBODY FUSION (TLIF) WITH PEDICLE SCREW FIXATION 1 LEVEL N/A 04/01/2022   Procedure: OPEN L4-5 TRANSFORAMINAL LUMBAR INTERBODY FUSION (TLIF);  Surgeon: Meade Maw, MD;  Location: [facility] ORS;  Service: Neurosurgery;  Laterality: N/A;   TRIGGER FINGER RELEASE Left 08/25/2015   TRIGGER FINGER RELEASE Right 09/18/2015   VAGINAL HYSTERECTOMY      SOCIAL HISTORY: Social History   Socioeconomic History   Marital status: Widowed    Spouse name: Not on file   Number of children: 3   Years of education: Not on file   Highest education level: Not on file  Occupational History    Not on file  Tobacco Use   Smoking status: Former    Packs/day: 1.00    Years: 50.00    Total pack years: 50.00    Types: Cigarettes    Quit date: 02/21/2022    Years since quitting: 0.1   Smokeless tobacco: Never  Vaping Use   Vaping Use: Never used  Substance and Sexual Activity   Alcohol use: No    Alcohol/week: 0.0 standard drinks of alcohol   Drug use: No   Sexual activity: Not on file  Other Topics Concern   Not on file  Social History Narrative   Three sons; lives alone   Social Determinants of Health   Financial Resource Strain: Low Risk  (02/21/2022)   Overall Financial Resource Strain (CARDIA)    Difficulty of Paying Living Expenses: Not hard at all  Food Insecurity: No Food Insecurity (02/21/2022)   Hunger Vital Sign    Worried About Running Out of Food in the Last Year: Never true    Ran Out of Food in the Last Year: Never true  Transportation Needs: No Transportation Needs (02/21/2022)  PRAPARE - Hydrologist (Medical): No    Lack of Transportation (Non-Medical): No  Physical Activity: Unknown (02/21/2022)   Exercise Vital Sign    Days of Exercise per Week: 0 days    Minutes of Exercise per Session: Not on file  Stress: No Stress Concern Present (02/21/2022)   [place]    Feeling of Stress : Not at all  Social Connections: Not on file  Intimate Partner Violence: Not At Risk (02/21/2022)   Humiliation, Afraid, Rape, and Kick questionnaire    Fear of Current or Ex-Partner: No    Emotionally Abused: No    Physically Abused: No    Sexually Abused: No    FAMILY HISTORY: Family History  Problem Relation Age of Onset   Diabetes Mother        type 2   Heart disease Mother    Hypertension Mother    Coronary artery disease Father    Heart attack Sister     ALLERGIES:  is allergic to eliquis [apixaban] and sulfa antibiotics.  MEDICATIONS:  Current Outpatient  Medications  Medication Sig Dispense Refill   clonazePAM (KLONOPIN) 0.5 MG tablet TAKE 1 TABLET BY MOUTH ONCE DAILY AS NEEDED FOR ANXIETY 30 tablet 0   cyanocobalamin (,VITAMIN B-12,) 1000 MCG/ML injection Inject 1 mL (1,000 mcg total) into the muscle every 30 (thirty) days. 10 mL 2   methocarbamol (ROBAXIN) 500 MG tablet Take 1 tablet (500 mg total) by mouth every 6 (six) hours as needed for muscle spasms. 120 tablet 0   nortriptyline (PAMELOR) 25 MG capsule Take 1 capsule (25 mg total) by mouth at bedtime. 90 capsule 1   omeprazole (PRILOSEC) 40 MG capsule TAKE 1 CAPSULE BY MOUTH TWICE DAILY (Patient taking differently: Take 40 mg by mouth at bedtime.) 180 capsule 1   rosuvastatin (CRESTOR) 10 MG tablet TAKE 1 TABLET BY MOUTH AT BEDTIME 90 tablet 1   sertraline (ZOLOFT) 100 MG tablet TAKE 1 TABLET BY MOUTH ONCE DAILY (Patient taking differently: Take 100 mg by mouth at bedtime.) 90 tablet 1   Syringe/Needle, Disp, (SYRINGE 3CC/25GX1") 25G X 1" 3 ML MISC Use for b12 injections 50 each 0   traZODone (DESYREL) 50 MG tablet Take 2 tablets (100 mg total) by mouth at bedtime. 180 tablet 1   No current facility-administered medications for this visit.     PHYSICAL EXAMINATION: ECOG PERFORMANCE STATUS: 1 - Symptomatic but completely ambulatory Vitals:   04/22/22 1339  BP: 117/64  Pulse: (!) 115  Temp: 98.4 F (36.9 C)   Filed Weights   04/22/22 1339  Weight: 92 lb (41.7 kg)    Physical Exam Constitutional:      General: She is not in acute distress. HENT:     Head: Normocephalic and atraumatic.  Eyes:     General: No scleral icterus.    Pupils: Pupils are equal, round, and reactive to light.  Cardiovascular:     Rate and Rhythm: Normal rate and regular rhythm.  Pulmonary:     Effort: Pulmonary effort is normal. No respiratory distress.     Breath sounds: No wheezing.  Abdominal:     General: Bowel sounds are normal.     Palpations: Abdomen is soft.  Musculoskeletal:         General: No deformity. Normal range of motion.     Cervical back: Normal range of motion and neck supple.     Right lower  leg: No edema.     Left lower leg: No edema.  Skin:    General: Skin is warm and dry.  Neurological:     Mental Status: She is alert and oriented to person, place, and time. Mental status is at baseline.     Cranial Nerves: No cranial nerve deficit.     Coordination: Coordination normal.  Psychiatric:     Comments: Anxious        Latest Ref Rng & Units 04/19/2022    7:01 PM  CMP  Glucose 70 - 99 mg/dL 97   BUN 8 - 23 mg/dL 12   Creatinine 0.44 - 1.00 mg/dL 0.78   Sodium 135 - 145 mmol/L 139   Potassium 3.5 - 5.1 mmol/L 4.2   Chloride 98 - 111 mmol/L 106   CO2 22 - 32 mmol/L 24   Calcium 8.9 - 10.3 mg/dL 9.3   Total Protein 6.5 - 8.1 g/dL 6.7   Total Bilirubin 0.3 - 1.2 mg/dL 0.6   Alkaline Phos 38 - 126 U/L 60   AST 15 - 41 U/L 27   ALT 0 - 44 U/L 20       Latest Ref Rng & Units 04/19/2022    7:01 PM  CBC  WBC 4.0 - 10.5 K/uL 9.5   Hemoglobin 12.0 - 15.0 g/dL 11.7   Hematocrit 36.0 - 46.0 % 36.1   Platelets 150 - 400 K/uL 388      US Venous Img Lower Unilateral Right  Result Date: 04/19/2022 CLINICAL DATA:  Right leg pain.  Recent surgery.  History of DVT. EXAM: RIGHT LOWER EXTREMITY VENOUS DOPPLER ULTRASOUND TECHNIQUE: Gray-scale sonography with compression, as well as color and duplex ultrasound, were performed to evaluate the deep venous system(s) from the level of the common femoral vein through the popliteal and proximal calf veins. COMPARISON:  Right lower extremity Doppler ultrasound 07/21/2007 FINDINGS: VENOUS Normal compressibility of the common femoral, superficial femoral, and popliteal veins, as well as the visualized calf veins. Visualized portions of profunda femoral vein and great saphenous vein unremarkable. No filling defects to suggest DVT on grayscale or color Doppler imaging. Doppler waveforms show normal direction of venous flow,  normal respiratory plasticity and response to augmentation. Limited views of the contralateral common femoral vein are unremarkable. OTHER 5.4 x 1.7 x 3.8 cm cyst in the right popliteal fossa. Limitations: none IMPRESSION: 1. No evidence of deep venous thrombosis in the right lower extremity. 2. 5.4 cm right Baker's cyst. Electronically Signed   By: Logan Bores M.D.   On: 04/19/2022 20:55   DG Lumbar Spine 2-3 Views  Result Date: 04/01/2022 CLINICAL DATA:  L4-5 fixation EXAM: LUMBAR SPINE - 2-3 VIEW COMPARISON:  MRI 02/05/2022 FINDINGS: Three intraoperative views demonstrate placement of trans pedicle screws and interbody fusion material at L4-5. IMPRESSION: Intraoperative imaging of L4-5 fixation. Electronically Signed   By: Abigail Miyamoto M.D.   On: 04/01/2022 16:03   DG C-Arm 1-60 Min-No Report  Result Date: 04/01/2022 Fluoroscopy was utilized by the requesting physician.  No radiographic interpretation.   DG C-Arm 1-60 Min-No Report  Result Date: 04/01/2022 Fluoroscopy was utilized by the requesting physician.  No radiographic interpretation.     LABORATORY DATA:  I have reviewed the data as listed Lab Results  Component Value Date   WBC 9.5 04/19/2022   HGB 11.7 (L) 04/19/2022   HCT 36.1 04/19/2022   MCV 101.4 (H) 04/19/2022   PLT 388 04/19/2022   Recent Labs  12/06/21 0839 03/27/22 0934 04/01/22 1816 04/19/22 1901  NA 137 140  --  139  K 4.3 3.9  --  4.2  CL 104 107  --  106  CO2 28 27  --  24  GLUCOSE 96 139*  --  97  BUN 7 13  --  12  CREATININE 0.68 0.68 0.75 0.78  CALCIUM 9.3 9.5  --  9.3  GFRNONAA  --  >60 >60 >60  PROT 6.3  --   --  6.7  ALBUMIN 4.4  --   --  4.4  AST 32  --   --  27  ALT 26  --   --  20  ALKPHOS 48  --   --  60  BILITOT 0.4  --   --  0.6    Iron/TIBC/Ferritin/ %Sat    Component Value Date/Time   IRON 151 (H) 06/11/2017 1022   IRONPCTSAT 33.4 06/11/2017 1022        ASSESSMENT & PLAN:  1. History of DVT (deep vein thrombosis)    2. History of Crohn's disease   3. Tobacco abuse    #Recurrent provoked lower extremity DVT Both her recent left lower extremity DVT and her remote right lower extremity DVT were provoked by surgery. Given that she recently had back surgery.  I recommend patient to complete another 3 weeks of Coumadin for anticoagulation.  Then she may stop Coumadin and I will order hypercoagulable work-up. Further anticoagulation recommendation pending on her hypercoagulable work-up.   #History of Crohn's disease, due to continue follow-up with gastroenterology.  #Former smoker. She is overdue for lung cancer screening, refer to lung cancer screening program.   All questions were answered. The patient knows to call the clinic with any problems questions or concerns.   Earlie Server, MD, PhD Hematology Oncology  04/22/2022

## 2022-04-24 ENCOUNTER — Other Ambulatory Visit: Payer: Self-pay | Admitting: Internal Medicine

## 2022-04-24 DIAGNOSIS — M1711 Unilateral primary osteoarthritis, right knee: Secondary | ICD-10-CM | POA: Diagnosis not present

## 2022-04-24 DIAGNOSIS — M7121 Synovial cyst of popliteal space [Baker], right knee: Secondary | ICD-10-CM | POA: Diagnosis not present

## 2022-04-24 DIAGNOSIS — M11261 Other chondrocalcinosis, right knee: Secondary | ICD-10-CM | POA: Diagnosis not present

## 2022-05-13 ENCOUNTER — Other Ambulatory Visit: Payer: Self-pay | Admitting: Neurosurgery

## 2022-05-13 DIAGNOSIS — Z981 Arthrodesis status: Secondary | ICD-10-CM

## 2022-05-16 ENCOUNTER — Ambulatory Visit
Admission: RE | Admit: 2022-05-16 | Discharge: 2022-05-16 | Disposition: A | Discharge status: Home / Self Care | Payer: Medicare Other | Attending: Neurosurgery | Admitting: Neurosurgery

## 2022-05-16 ENCOUNTER — Ambulatory Visit
Admission: RE | Admit: 2022-05-16 | Discharge: 2022-05-16 | Disposition: A | Discharge status: Home / Self Care | Payer: Medicare Other | Source: Ambulatory Visit | Attending: Neurosurgery | Admitting: Neurosurgery

## 2022-05-16 ENCOUNTER — Ambulatory Visit: Payer: Medicare Other | Admitting: Neurosurgery

## 2022-05-16 ENCOUNTER — Encounter: Payer: Self-pay | Admitting: Neurosurgery

## 2022-05-16 VITALS — BP 128/71 | HR 95 | Temp 98.3°F | Ht 63.0 in | Wt 90.0 lb

## 2022-05-16 DIAGNOSIS — M4186 Other forms of scoliosis, lumbar region: Secondary | ICD-10-CM | POA: Diagnosis not present

## 2022-05-16 DIAGNOSIS — Z981 Arthrodesis status: Secondary | ICD-10-CM

## 2022-05-16 DIAGNOSIS — M5416 Radiculopathy, lumbar region: Secondary | ICD-10-CM

## 2022-05-16 DIAGNOSIS — K50912 Crohn's disease, unspecified, with intestinal obstruction: Secondary | ICD-10-CM

## 2022-05-16 DIAGNOSIS — J449 Chronic obstructive pulmonary disease, unspecified: Secondary | ICD-10-CM

## 2022-05-16 DIAGNOSIS — E441 Mild protein-calorie malnutrition: Secondary | ICD-10-CM

## 2022-05-16 DIAGNOSIS — M47816 Spondylosis without myelopathy or radiculopathy, lumbar region: Secondary | ICD-10-CM | POA: Diagnosis not present

## 2022-05-16 MED ORDER — MELOXICAM 7.5 MG PO TABS: 7.5000 mg | ORAL_TABLET | Freq: Every day | ORAL | 0 refills | Status: DC

## 2022-05-16 NOTE — Progress Notes (Addendum)
   DOS: 04/01/22 (Open L4-5 TLIF)  HISTORY OF PRESENT ILLNESS: 05/16/2022 [name] is status post transforaminal lumbar interbody fusion.  In her postoperative period, she had a Baker's cyst that was drained.  This caused severe pain in her right knee.  She is seeing orthopedics tomorrow.  She still having some residual radiculopathy, but her pain is much better than it was prior to surgery.   PHYSICAL EXAMINATION:   Vitals:   05/16/22 1020  BP: 128/71  Pulse: 95  Temp: 98.3 F (36.8 C)   General: Patient is well developed, well nourished, calm, collected, and in no apparent distress.  NEUROLOGICAL:  General: In no acute distress.  Awake, alert, oriented to person, place, and time. Pupils equal round and reactive to light.   Strength:  Side Iliopsoas Quads Hamstring PF DF EHL  R 5 5 5 5 5 5   L 5 5 5 5 5 5    Incision c/d/i   ROS (Neurologic): Negative except as noted above  IMAGING: No complications noted  ASSESSMENT/PLAN:  Tamara [name] is doing well after lumbar surgery.  She is definitely improved compared to preoperative.  I would like to see how she does over the next few weeks.  I will see her back in 6 to 8 weeks with x-rays.  We will double her nortriptyline to 50 mg daily and I have started meloxicam.  I suspect her nerve pain will improve with time.  I spent a total of 15 in face-to-face and non-face-to-face activities related to this patient's care today.   Meade Maw MD, [facility] Department of Neurosurgery

## 2022-05-17 DIAGNOSIS — M11261 Other chondrocalcinosis, right knee: Secondary | ICD-10-CM | POA: Diagnosis not present

## 2022-05-17 DIAGNOSIS — M1711 Unilateral primary osteoarthritis, right knee: Secondary | ICD-10-CM | POA: Diagnosis not present

## 2022-05-20 ENCOUNTER — Other Ambulatory Visit: Payer: Medicare Other

## 2022-05-20 ENCOUNTER — Inpatient Hospital Stay: Payer: Medicare Other | Attending: Oncology

## 2022-05-20 DIAGNOSIS — D72829 Elevated white blood cell count, unspecified: Secondary | ICD-10-CM | POA: Diagnosis not present

## 2022-05-20 DIAGNOSIS — Z720 Tobacco use: Secondary | ICD-10-CM

## 2022-05-20 DIAGNOSIS — Z8719 Personal history of other diseases of the digestive system: Secondary | ICD-10-CM

## 2022-05-20 DIAGNOSIS — Z86718 Personal history of other venous thrombosis and embolism: Secondary | ICD-10-CM | POA: Diagnosis not present

## 2022-05-20 LAB — ANTITHROMBIN III: AntiThromb III Func: 123 % — ABNORMAL HIGH (ref 75–120)

## 2022-05-21 LAB — PROTEIN S, TOTAL AND FREE
Protein S Ag, Free: 101 % (ref 61–136)
Protein S Ag, Total: 119 % (ref 60–150)

## 2022-05-21 LAB — BETA-2-GLYCOPROTEIN I ABS, IGG/M/A
Beta-2 Glyco I IgG: <9 GPI IgG units (ref 0–20)
Beta-2-Glycoprotein I IgA: <9 GPI IgA units (ref 0–25)
Beta-2-Glycoprotein I IgM: <9 GPI IgM units (ref 0–32)

## 2022-05-21 LAB — ANTIPHOSPHOLIPID SYNDROME PROF
Anticardiolipin IgG: <9 GPL U/mL (ref 0–14)
Anticardiolipin IgM: <9 MPL U/mL (ref 0–12)
DRVVT: 27.9 s (ref 0.0–47.0)
PTT Lupus Anticoagulant: 28 s (ref 0.0–43.5)

## 2022-05-21 LAB — PROTEIN C ACTIVITY: Protein C Activity: 142 % (ref 73–180)

## 2022-05-24 LAB — PROTHROMBIN GENE MUTATION

## 2022-05-27 LAB — FACTOR 5 LEIDEN

## 2022-05-30 ENCOUNTER — Telehealth: Payer: Self-pay

## 2022-05-30 ENCOUNTER — Encounter: Payer: Self-pay | Admitting: Oncology

## 2022-05-30 NOTE — Telephone Encounter (Signed)
Pt informed via Mychart.

## 2022-05-30 NOTE — Telephone Encounter (Signed)
-----   Message from Earlie Server, MD sent at 05/29/2022 11:32 PM EDT ----- Please let patient know that her hypercoagulable work up is negative.  She can stay off anticoagulation.  I recommend her to contact me for follow up in the future if she has surgery scheduled. She will need prophylactic anticoagulation.  Follow up PRN

## 2022-05-30 NOTE — Telephone Encounter (Signed)
Pt has labs on 7/10 and was TBD.

## 2022-06-05 ENCOUNTER — Encounter: Payer: Self-pay | Admitting: Neurosurgery

## 2022-06-10 ENCOUNTER — Other Ambulatory Visit: Payer: Self-pay | Admitting: Internal Medicine

## 2022-06-26 ENCOUNTER — Other Ambulatory Visit: Payer: Self-pay

## 2022-06-26 DIAGNOSIS — Z981 Arthrodesis status: Secondary | ICD-10-CM

## 2022-06-27 ENCOUNTER — Ambulatory Visit
Admission: RE | Admit: 2022-06-27 | Discharge: 2022-06-27 | Disposition: A | Discharge status: Home / Self Care | Payer: Medicare Other | Attending: Internal Medicine | Admitting: Internal Medicine

## 2022-06-27 ENCOUNTER — Ambulatory Visit (INDEPENDENT_AMBULATORY_CARE_PROVIDER_SITE_OTHER): Payer: Medicare Other | Admitting: Neurosurgery

## 2022-06-27 ENCOUNTER — Ambulatory Visit
Admission: RE | Admit: 2022-06-27 | Discharge: 2022-06-27 | Disposition: A | Discharge status: Home / Self Care | Payer: Medicare Other | Source: Ambulatory Visit | Attending: Neurosurgery | Admitting: Neurosurgery

## 2022-06-27 ENCOUNTER — Encounter: Payer: Self-pay | Admitting: Neurosurgery

## 2022-06-27 VITALS — BP 124/70 | HR 88 | Temp 98.1°F | Ht 63.0 in | Wt 92.0 lb

## 2022-06-27 DIAGNOSIS — Z981 Arthrodesis status: Secondary | ICD-10-CM

## 2022-06-27 DIAGNOSIS — M5416 Radiculopathy, lumbar region: Secondary | ICD-10-CM

## 2022-06-27 DIAGNOSIS — M545 Low back pain, unspecified: Secondary | ICD-10-CM | POA: Diagnosis not present

## 2022-06-27 DIAGNOSIS — M25551 Pain in right hip: Secondary | ICD-10-CM

## 2022-06-27 NOTE — Progress Notes (Signed)
   DOS: 04/01/22 (Open L4-5 TLIF)  HISTORY OF PRESENT ILLNESS: 06/27/2022 She was doing well until she started physical therapy.  She now having pain in her right buttock and down the back of the leg.  This been bothersome for her.  She is very frustrated.  She did start smoking again 2 to 4 weeks ago.  05/16/22 [name] is status post transforaminal lumbar interbody fusion.  In her postoperative period, she had a Baker's cyst that was drained.  This caused severe pain in her right knee.  She is seeing orthopedics tomorrow.  She still having some residual radiculopathy, but her pain is much better than it was prior to surgery.   PHYSICAL EXAMINATION:   Vitals:   06/27/22 1030  BP: 124/70  Pulse: 88  Temp: 98.1 F (36.7 C)   General: Patient is well developed, well nourished, calm, collected, and in no apparent distress.  NEUROLOGICAL:  General: In no acute distress.  Awake, alert, oriented to person, place, and time. Pupils equal round and reactive to light.   Strength:  Side Iliopsoas Quads Hamstring PF DF EHL  R 5 5 5 5 5 5   L 5 5 5 5 5 5    Incision c/d/i   + FABER on R  ROS (Neurologic): Negative except as noted above  IMAGING: No complications noted  ASSESSMENT/PLAN:  Tamara [name] is doing fair after lumbar surgery.    It is not clear to me whether she has lumbar radiculopathy that is persistent or hip pathology.  We will reimage her at this time with lumbar spine MRI scan.  If that is negative, I will send her for orthopedic evaluation with Dr. Marry Guan.  Either way I will see her back in follow-up.  I have recommended that she discontinue smoking.   Meade Maw MD, [facility] Department of Neurosurgery

## 2022-07-08 ENCOUNTER — Ambulatory Visit
Admission: RE | Admit: 2022-07-08 | Discharge: 2022-07-08 | Disposition: A | Discharge status: Home / Self Care | Payer: Medicare Other | Source: Ambulatory Visit | Attending: Neurosurgery | Admitting: Neurosurgery

## 2022-07-08 DIAGNOSIS — M5416 Radiculopathy, lumbar region: Secondary | ICD-10-CM | POA: Insufficient documentation

## 2022-07-08 DIAGNOSIS — Z981 Arthrodesis status: Secondary | ICD-10-CM | POA: Diagnosis not present

## 2022-07-08 DIAGNOSIS — M47816 Spondylosis without myelopathy or radiculopathy, lumbar region: Secondary | ICD-10-CM | POA: Diagnosis not present

## 2022-07-08 DIAGNOSIS — M5126 Other intervertebral disc displacement, lumbar region: Secondary | ICD-10-CM | POA: Diagnosis not present

## 2022-07-09 ENCOUNTER — Other Ambulatory Visit: Payer: Self-pay

## 2022-07-09 DIAGNOSIS — M25551 Pain in right hip: Secondary | ICD-10-CM

## 2022-07-16 ENCOUNTER — Other Ambulatory Visit: Payer: Self-pay | Admitting: Internal Medicine

## 2022-07-23 DIAGNOSIS — M7061 Trochanteric bursitis, right hip: Secondary | ICD-10-CM | POA: Diagnosis not present

## 2022-08-08 ENCOUNTER — Telehealth: Payer: Self-pay | Admitting: Internal Medicine

## 2022-08-08 NOTE — Telephone Encounter (Signed)
Patient is requesting a refill on her clonazePAM (KLONOPIN) 0.5 MG tablet.  FYI: Appointment was made for 09/02/2022.

## 2022-08-08 NOTE — Telephone Encounter (Signed)
Pt is aware that rx was sent in on 07/17/2022 with 2 refills.

## 2022-08-09 ENCOUNTER — Ambulatory Visit (INDEPENDENT_AMBULATORY_CARE_PROVIDER_SITE_OTHER): Payer: Medicare Other

## 2022-08-09 DIAGNOSIS — Z23 Encounter for immunization: Secondary | ICD-10-CM

## 2022-08-12 IMAGING — MR MR WRIST*L* W/O CM
6 series · 40 of 40 positions shown · non-contrast
Comparison: Left wrist x-rays dated June 27, 2015.

CLINICAL DATA: Persistent left wrist pain at the base of the thumb
since fall last [REDACTED]. No prior surgery.

EXAM:
MR OF THE LEFT WRIST WITHOUT CONTRAST
TECHNIQUE: Multiplanar, multisequence MR imaging of the left wrist was
performed. No intravenous contrast was administered.

[Series 3: T2 fat-sat · axial · left · 2.0mm · 0.47mm/px · z∈[-51,+7]mm · 9 of 25 slices shown (1 of 2)]
[im 1/25]
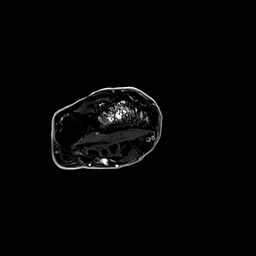
[im 4/25]
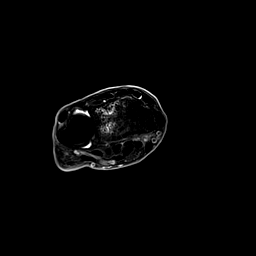
[im 7/25]
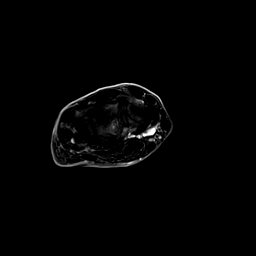
[im 10/25]
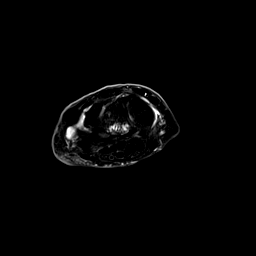
[im 13/25]
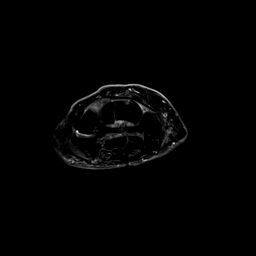
[im 16/25]
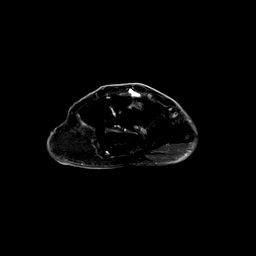
[im 19/25]
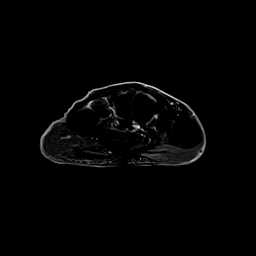
[im 22/25]
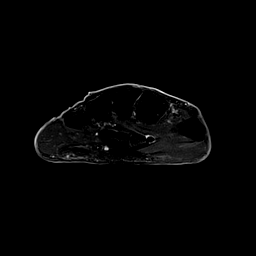
[im 25/25]
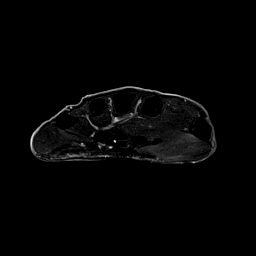

[Series 4: T1 · axial · left · 2.0mm · 0.47mm/px · z∈[-51,+7]mm · 9 of 25 slices shown (1 of 2)]
[im 1/25]
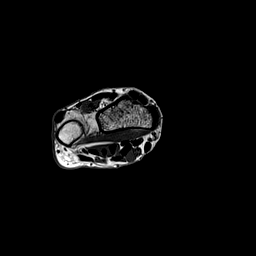
[im 4/25]
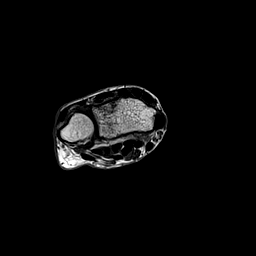
[im 7/25]
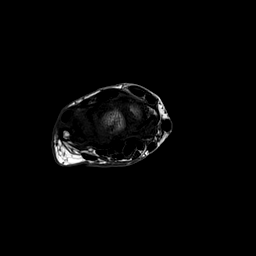
[im 10/25]
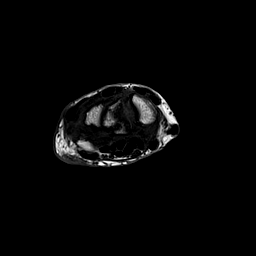
[im 13/25]
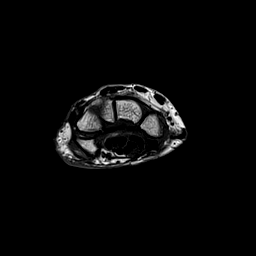
[im 16/25]
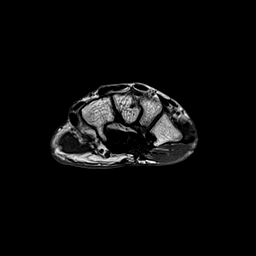
[im 19/25]
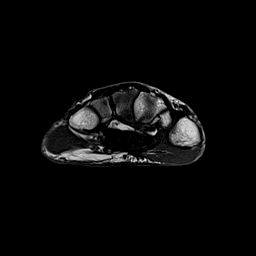
[im 22/25]
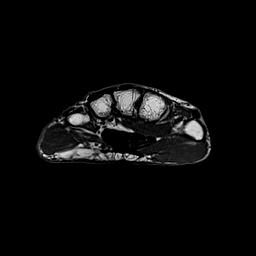
[im 25/25]
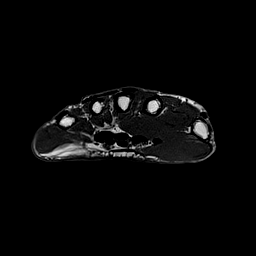

[Series 5: T1 · coronal · left · 3.0mm · 0.39mm/px · 5 of 13 slices shown (2 of 2)]
[im 1/13]
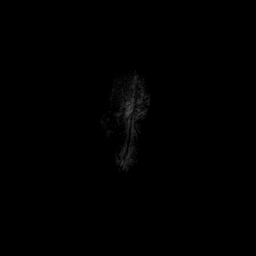
[im 4/13]
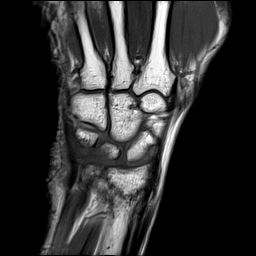
[im 7/13]
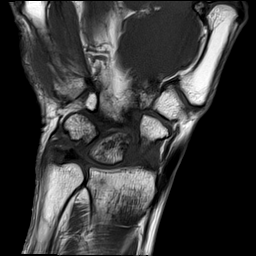
[im 10/13]
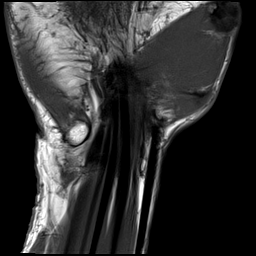
[im 13/13]
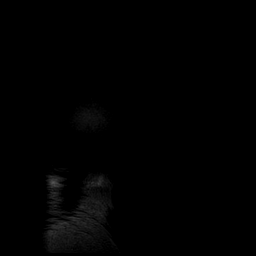

[Series 6: T2 fat-sat · coronal · left · 3.0mm · 0.39mm/px · 5 of 13 slices shown (2 of 2)]
[im 1/13]
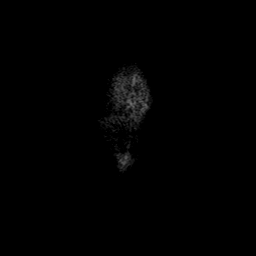
[im 4/13]
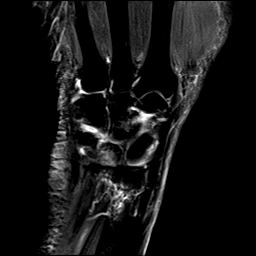
[im 7/13]
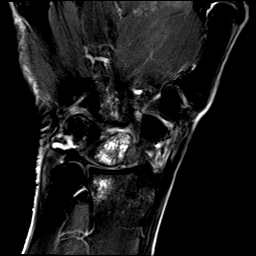
[im 10/13]
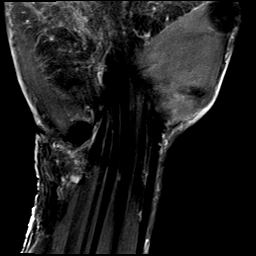
[im 13/13]
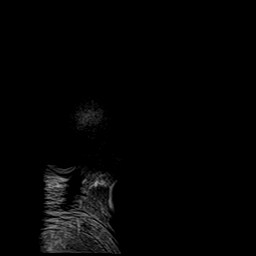

[Series 7: PD fat-sat · coronal · left · 3.0mm · 0.39mm/px · 5 of 13 slices shown (1 of 2)]
[im 1/13]
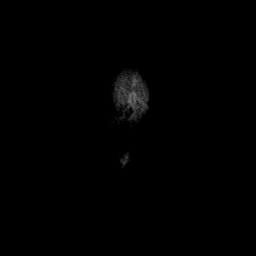
[im 4/13]
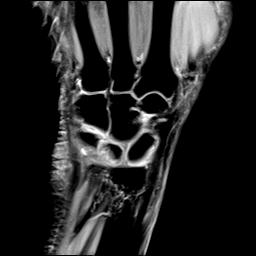
[im 7/13]
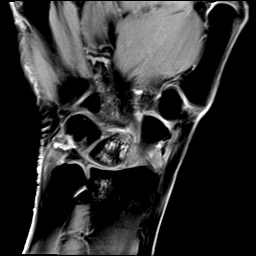
[im 10/13]
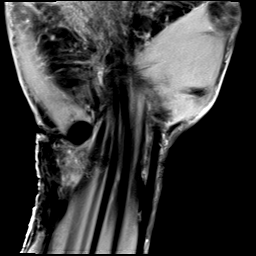
[im 13/13]
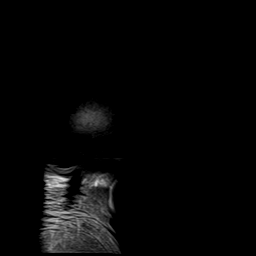

[Series 8: PD fat-sat · sagittal · left · 3.0mm · 0.39mm/px · 7 of 20 slices shown (2 of 2)]
[im 1/20]
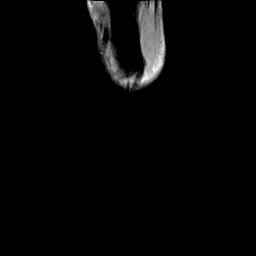
[im 4/20]
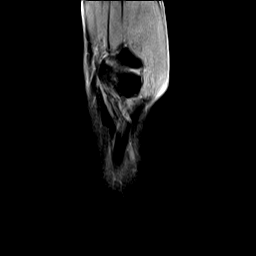
[im 7/20]
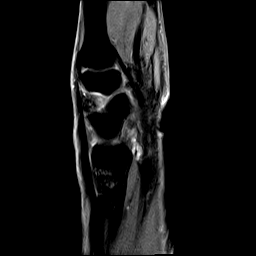
[im 10/20]
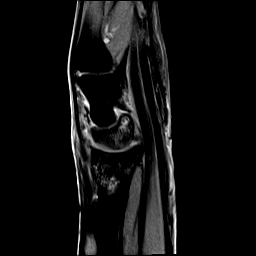
[im 13/20]
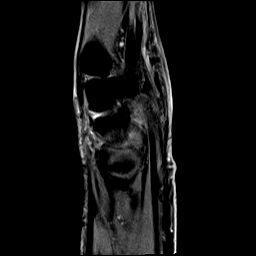
[im 16/20]
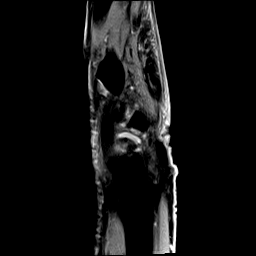
[im 20/20]
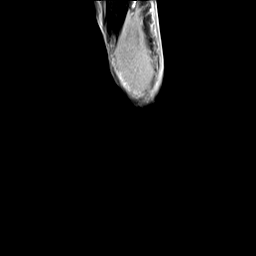

[40 of 40 positions shown; findings below may reference images not displayed]

FINDINGS: Ligaments: Intact scapholunate and lunotriquetral ligaments.

Triangular fibrocartilage: Intact TFCC. Mild degeneration of the
articular disc.

Tendons: Intact flexor and extensor compartment tendons.

Carpal tunnel/median nerve: Normal carpal tunnel. Normal median
nerve.

Guyon's canal: Normal.

Joint/cartilage: No joint effusion. No chondral defect.

Bones/carpal alignment: Subacute, healing nondisplaced fractures of
the distal radius and volar pole of the lunate. Normal alignment. No
suspicious bone lesion.

Other: None.
IMPRESSION: 1. Subacute, healing nondisplaced fractures of the distal radius and
volar pole of the lunate.

## 2022-09-04 ENCOUNTER — Ambulatory Visit: Payer: Medicare Other | Admitting: Internal Medicine

## 2022-09-10 ENCOUNTER — Encounter: Payer: Self-pay | Admitting: Neurosurgery

## 2022-09-11 DIAGNOSIS — L570 Actinic keratosis: Secondary | ICD-10-CM | POA: Diagnosis not present

## 2022-09-11 DIAGNOSIS — L821 Other seborrheic keratosis: Secondary | ICD-10-CM | POA: Diagnosis not present

## 2022-09-11 DIAGNOSIS — L298 Other pruritus: Secondary | ICD-10-CM | POA: Diagnosis not present

## 2022-09-12 ENCOUNTER — Encounter: Payer: Self-pay | Admitting: Internal Medicine

## 2022-09-12 ENCOUNTER — Ambulatory Visit (INDEPENDENT_AMBULATORY_CARE_PROVIDER_SITE_OTHER): Payer: Medicare Other | Admitting: Internal Medicine

## 2022-09-12 VITALS — BP 120/68 | HR 90 | Temp 98.0°F | Ht 63.0 in | Wt 96.4 lb

## 2022-09-12 DIAGNOSIS — T380X5A Adverse effect of glucocorticoids and synthetic analogues, initial encounter: Secondary | ICD-10-CM | POA: Diagnosis not present

## 2022-09-12 DIAGNOSIS — Z79899 Other long term (current) drug therapy: Secondary | ICD-10-CM

## 2022-09-12 DIAGNOSIS — E785 Hyperlipidemia, unspecified: Secondary | ICD-10-CM

## 2022-09-12 DIAGNOSIS — R918 Other nonspecific abnormal finding of lung field: Secondary | ICD-10-CM

## 2022-09-12 DIAGNOSIS — R7301 Impaired fasting glucose: Secondary | ICD-10-CM | POA: Diagnosis not present

## 2022-09-12 DIAGNOSIS — M818 Other osteoporosis without current pathological fracture: Secondary | ICD-10-CM | POA: Diagnosis not present

## 2022-09-12 DIAGNOSIS — M48062 Spinal stenosis, lumbar region with neurogenic claudication: Secondary | ICD-10-CM | POA: Diagnosis not present

## 2022-09-12 LAB — CBC WITH DIFFERENTIAL/PLATELET
Basophils Absolute: 0 10*3/uL (ref 0.0–0.1)
Basophils Relative: 0.5 % (ref 0.0–3.0)
Eosinophils Absolute: 0.1 10*3/uL (ref 0.0–0.7)
Eosinophils Relative: 0.7 % (ref 0.0–5.0)
HCT: 35.6 % — ABNORMAL LOW (ref 36.0–46.0)
Hemoglobin: 11.7 g/dL — ABNORMAL LOW (ref 12.0–15.0)
Lymphocytes Relative: 49 % — ABNORMAL HIGH (ref 12.0–46.0)
Lymphs Abs: 4.5 10*3/uL — ABNORMAL HIGH (ref 0.7–4.0)
MCHC: 32.8 g/dL (ref 30.0–36.0)
MCV: 99.7 fl (ref 78.0–100.0)
Monocytes Absolute: 1.1 10*3/uL — ABNORMAL HIGH (ref 0.1–1.0)
Monocytes Relative: 12 % (ref 3.0–12.0)
Neutro Abs: 3.4 10*3/uL (ref 1.4–7.7)
Neutrophils Relative %: 37.8 % — ABNORMAL LOW (ref 43.0–77.0)
Platelets: 292 10*3/uL (ref 150.0–400.0)
RBC: 3.57 Mil/uL — ABNORMAL LOW (ref 3.87–5.11)
RDW: 16.5 % — ABNORMAL HIGH (ref 11.5–15.5)
WBC: 9.1 10*3/uL (ref 4.0–10.5)

## 2022-09-12 LAB — COMPREHENSIVE METABOLIC PANEL
ALT: 18 U/L (ref 0–35)
AST: 22 U/L (ref 0–37)
Albumin: 4.2 g/dL (ref 3.5–5.2)
Alkaline Phosphatase: 43 U/L (ref 39–117)
BUN: 16 mg/dL (ref 6–23)
CO2: 26 mEq/L (ref 19–32)
Calcium: 9.2 mg/dL (ref 8.4–10.5)
Chloride: 107 mEq/L (ref 96–112)
Creatinine, Ser: 0.74 mg/dL (ref 0.40–1.20)
GFR: 79.46 mL/min (ref >60.00)
Glucose, Bld: 94 mg/dL (ref 70–99)
Potassium: 4.4 mEq/L (ref 3.5–5.1)
Sodium: 140 mEq/L (ref 135–145)
Total Bilirubin: 0.4 mg/dL (ref 0.2–1.2)
Total Protein: 6 g/dL (ref 6.0–8.3)

## 2022-09-12 LAB — LIPID PANEL
Cholesterol: 131 mg/dL (ref 0–200)
HDL: 65.6 mg/dL (ref >39.00)
LDL Cholesterol: 43 mg/dL (ref 0–99)
NonHDL: 65.28
Total CHOL/HDL Ratio: 2
Triglycerides: 110 mg/dL (ref 0.0–149.0)
VLDL: 22 mg/dL (ref 0.0–40.0)

## 2022-09-12 LAB — TSH: TSH: 1.32 u[IU]/mL (ref 0.35–5.50)

## 2022-09-12 LAB — HEMOGLOBIN A1C: Hgb A1c MFr Bld: 6.3 % (ref 4.6–6.5)

## 2022-09-12 LAB — LDL CHOLESTEROL, DIRECT: Direct LDL: 43 mg/dL

## 2022-09-12 MED ORDER — CLONAZEPAM 0.5 MG PO TABS: 0.5000 mg | ORAL_TABLET | Freq: Every day | ORAL | 5 refills | Status: DC | PRN

## 2022-09-12 MED ORDER — RSVPREF3 VAC RECOMB ADJUVANTED 120 MCG/0.5ML IM SUSR: 0.5000 mL | Freq: Once | INTRAMUSCULAR | 0 refills | Status: AC

## 2022-09-12 NOTE — Assessment & Plan Note (Addendum)
Managed by to [facility] , last Reclast infusion in April 2022.

## 2022-09-12 NOTE — Progress Notes (Signed)
Subjective:  Patient ID: Tamara [name], female    DOB: Feb 17, 1946  Age: 76 y.o. MRN: [id number]  CC: The primary encounter diagnosis was Hyperlipidemia, unspecified hyperlipidemia type. Diagnoses of Long-term use of high-risk medication, Impaired fasting glucose, Pulmonary nodules, Spinal stenosis, lumbar region, with neurogenic claudication, and Steroid-induced osteoporosis were also pertinent to this visit.   HPI Tamara [name] presents for follow up on chronic medical issues Chief Complaint  Patient presents with   Follow-up    Follow up for medication refills   Tamara [name] is a 76 yr old female with GAD,  chronic underweight secondary to Crohn's Disease who was last seen in May.   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  Has stopped gabapentin   Left knee still feels numb and tender at the same time.  She Has limited extension of knee,  there has been no change post operatively after PT  Evaluated by Dr Tamara Hakim PA  in September for trochanteric bursitis of right hip . She was offered but deferred 1) an  I/A injection and  2) Physical therapy .  She has instead been using  yard work as her PT.   (Using a leaf blower  . Does not wear ear protection   Insomnia: using clonazepam and trazodone . Refill history confirmed via [place] Controlled Substance databas, accessed by me today..  she has had no other controlled substances since June (oxycodone post op pain )  not sleeping more than 4 hours per night due to waking up early  .  She does not drink alcohol or use any illicits.  She lives alone with her 3 dogs.   She was recently turned down for life insurance,  she was told it was because she has COPD.  She is wondering when that diagnosis was made and by whom.    Records reviewed:  she has centrilobular emphysema  noted on Chest CT in 2021, But is asymptomatic and does not have COPD  listed on her  problems list.   She has a concurrent history of pulmonary nodules and is currently smoking 1 pack daily.  Last CT  chest was in 2021   1) B12 deficiency :  she has been self administering monthly doses parenterally since diagnosis in 2022   Lab Results  Component Value Date   VITAMINB12 184 (L) 01/29/2021    Outpatient Medications Prior to Visit  Medication Sig Dispense Refill   cyanocobalamin (,VITAMIN B-12,) 1000 MCG/ML injection Inject 1 mL (1,000 mcg total) into the muscle every 30 (thirty) days. 10 mL 2   nortriptyline (PAMELOR) 25 MG capsule TAKE 1 CAPSULE BY MOUTH AT BEDTIME 90 capsule 1   omeprazole (PRILOSEC) 40 MG capsule TAKE 1 CAPSULE BY MOUTH TWICE DAILY (Patient taking differently: Take 40 mg by mouth at bedtime.) 180 capsule 1   rosuvastatin (CRESTOR) 10 MG tablet TAKE 1 TABLET BY MOUTH AT BEDTIME 90 tablet 1   sertraline (ZOLOFT) 100 MG tablet TAKE 1 TABLET BY MOUTH ONCE DAILY 90 tablet 1   Syringe/Needle, Disp, (SYRINGE 3CC/25GX1") 25G X 1" 3  ML MISC Use for b12 injections 50 each 0   traZODone (DESYREL) 50 MG tablet Take 2 tablets (100 mg total) by mouth at bedtime. 180 tablet 1   clonazePAM (KLONOPIN) 0.5 MG tablet TAKE 1 TABLET BY MOUTH ONCE DAILY AS NEEDED FOR ANXIETY 30 tablet 2   gabapentin (NEURONTIN) 100 MG capsule Take 100 mg by mouth 3 (three) times daily. (Patient not taking: Reported on 09/12/2022)     methocarbamol (ROBAXIN) 500 MG tablet Take 1 tablet (500 mg total) by mouth every 6 (six) hours as needed for muscle spasms. (Patient not taking: Reported on 09/12/2022) 120 tablet 0   No  facility-administered medications prior to visit.    Review of Systems;  Patient denies headache, fevers, malaise, unintentional weight loss, skin rash, eye pain, sinus congestion and sinus pain, sore throat, dysphagia,  hemoptysis , cough, dyspnea, wheezing, chest pain, palpitations, orthopnea, edema, abdominal pain, nausea, melena, diarrhea, constipation, flank pain, dysuria, hematuria, urinary  Frequency, nocturia, numbness, tingling, seizures,  Focal weakness, Loss of consciousness,  Tremor, insomnia, depression, anxiety, and suicidal ideation.      Objective:  BP 120/68 (BP Location: Left Arm, Patient Position: Sitting, Cuff Size: Normal)   Pulse 90   Temp 98 F (36.7 C) (Oral)   Ht _0  (1.6 m)   Wt 96 lb 6.4 oz (43.7 kg)   SpO2 98%   BMI 17.08 kg/m   BP Readings from Last 3 Encounters:  09/12/22 120/68  06/27/22 124/70  05/16/22 128/71    Wt Readings from Last 3 Encounters:  09/12/22 96 lb 6.4 oz (43.7 kg)  06/27/22 92 lb (41.7 kg)  05/16/22 90 lb (40.8 kg)    General appearance: alert, cooperative and appears stated age Ears: normal TM's and external ear canals both ears Throat: lips, mucosa, and tongue normal; teeth and gums normal Neck: no adenopathy, no carotid bruit, supple, symmetrical, trachea midline and thyroid not enlarged, symmetric, no tenderness/mass/nodules Back: symmetric, no curvature. ROM normal. No CVA tenderness. Lungs: clear to auscultation bilaterally Heart: regular rate and rhythm, S1, S2 normal, no murmur, click, rub or gallop Abdomen: soft, non-tender; bowel sounds normal; no masses,  no organomegaly Pulses: 2+ and symmetric Skin: Skin color, texture, turgor normal. No rashes or lesions Lymph nodes: Cervical, supraclavicular, and axillary nodes normal. Neuro:  awake and interactive with normal mood and affect. Higher cortical functions are normal. Speech is clear without word-finding difficulty or dysarthria. Extraocular movements are intact.  Visual fields of both eyes are grossly intact. Sensation to light touch is grossly intact bilaterally of upper and lower extremities. Motor examination shows 4+/5 symmetric hand grip and upper extremity and 5/5 lower extremity strength. There is no pronation or drift. Gait is non-ataxic   Lab Results  Component Value Date   HGBA1C 6.3 09/12/2022   HGBA1C 6.1 09/21/2020    Lab Results  Component Value Date   CREATININE 0.74 09/12/2022   CREATININE 0.78 04/19/2022   CREATININE 0.75 04/01/2022    Lab Results  Component Value Date   WBC 9.1 09/12/2022   HGB 11.7 (L) 09/12/2022   HCT 35.6 (L) 09/12/2022   PLT 292.0 09/12/2022   GLUCOSE 94 09/12/2022   CHOL 131 09/12/2022   TRIG 110.0 09/12/2022   HDL 65.60 09/12/2022   LDLDIRECT 43.0 09/12/2022   LDLCALC 43 09/12/2022   ALT 18 09/12/2022   AST 22 09/12/2022   NA 140 09/12/2022   K 4.4 09/12/2022   CL 107 09/12/2022   CREATININE 0.74  09/12/2022   BUN 16 09/12/2022   CO2 26 09/12/2022   TSH 1.32 09/12/2022   INR 1.0 04/19/2022   HGBA1C 6.3 09/12/2022    MR LUMBAR SPINE WO CONTRAST  Result Date: 07/09/2022 CLINICAL DATA:  Low back pain radiating down the right leg for the past 2 months. Prior fusion in May. EXAM: MRI LUMBAR SPINE WITHOUT CONTRAST TECHNIQUE: Multiplanar, multisequence MR imaging of the lumbar spine was performed. No intravenous contrast was administered. COMPARISON:  Lumbar spine x-rays dated June 27, 2022. MRI lumbar spine dated February 05, 2022. FINDINGS: Segmentation:  Standard. Alignment:  New trace retrolisthesis at L3-L4. Vertebrae:  No fracture, evidence of discitis, or bone lesion. Conus medullaris and cauda equina: Conus extends to the L1 level. Conus and cauda equina appear normal. Paraspinal and other soft tissues: New postsurgical changes in the lower lumbar paraspinous soft tissues. No fluid collection. Disc levels: T12-L1:  Negative. L1-L2:  Negative. L2-L3: Unchanged mild disc bulging with superimposed  small left foraminal disc protrusion. No stenosis. L3-L4: Progressive mild disc bulging and unchanged bilateral facet arthropathy. Unchanged mild spinal canal and bilateral lateral recess stenosis. New mild right and unchanged mild left neuroforaminal stenosis. L4-L5: Interval PLIF. Resolved right lateral recess and neuroforaminal stenosis. No spinal canal or left neuroforaminal stenosis. L5-S1: Unchanged mild disc bulging with central annular fissure. Unchanged mild bilateral facet arthropathy. No stenosis. IMPRESSION: 1. Interval PLIF at L4-L5 with resolved right lateral recess and neuroforaminal stenosis. 2. Progressive mild degenerative disc disease at L3-L4 with new mild right neuroforaminal stenosis. Electronically Signed   By: Titus Dubin M.D.   On: 07/09/2022 12:24    Assessment & Plan:   Problem List Items Addressed This Visit     Steroid-induced osteoporosis    Managed by to [facility] , last Reclast infusion in April 2022.       Spinal stenosis, lumbar region, with neurogenic claudication   Relevant Medications   clonazePAM (KLONOPIN) 0.5 MG tablet   Other Visit Diagnoses     Hyperlipidemia, unspecified hyperlipidemia type    -  Primary   Relevant Orders   Lipid Profile (Completed)   Direct LDL (Completed)   Long-term use of high-risk medication       Relevant Orders   Comp Met (CMET) (Completed)   CBC with Differential/Platelet (Completed)   TSH (Completed)   Impaired fasting glucose       Relevant Orders   HgB A1c (Completed)   Pulmonary nodules       Relevant Orders   Ambulatory referral to Pulmonology       I spent a total of   30  minutes with this patient in a face to face visit on the date of this encounter reviewing the last office visit with me in  March, her most recent visit with neurosurgery , last DEXA scan. and post visit ordering of testing and therapeutics.    Follow-up: No follow-ups on file.   Crecencio Mc, MD

## 2022-09-12 NOTE — Patient Instructions (Addendum)
  I do recommend the RSV vaccine for you, .  It is now available at your pharmacy  and will protect you against the Respiratory Syncytial Virus. Prescription has been sent.   I have made a referral to pulmonology to get your pulmonary nodules reevaluated  You have not had a chest CT since 2021 (emphysema was also seen on the CT)  You might want to try adding Relaxium for insomnia  (as seen on TV commercials) . It is available through [employer] and contains all natural supplements:  Melatonin 5 mg  Chamomile 25 mg Passionflower extract 75 mg GABA 100 mg Ashwaganda extract 125 mg Magnesium citrate, glycinate, oxide (100 mg)  L tryptophan 500 mg Valerest (proprietary  ingredient ; probably valeria root extract)

## 2022-09-28 ENCOUNTER — Other Ambulatory Visit: Payer: Self-pay | Admitting: Internal Medicine

## 2022-10-22 DIAGNOSIS — Z8619 Personal history of other infectious and parasitic diseases: Secondary | ICD-10-CM | POA: Diagnosis not present

## 2022-10-22 DIAGNOSIS — K5090 Crohn's disease, unspecified, without complications: Secondary | ICD-10-CM | POA: Diagnosis not present

## 2022-10-22 DIAGNOSIS — K219 Gastro-esophageal reflux disease without esophagitis: Secondary | ICD-10-CM | POA: Diagnosis not present

## 2022-10-22 DIAGNOSIS — R110 Nausea: Secondary | ICD-10-CM | POA: Diagnosis not present

## 2022-10-22 DIAGNOSIS — K5900 Constipation, unspecified: Secondary | ICD-10-CM | POA: Diagnosis not present

## 2022-10-22 DIAGNOSIS — K50912 Crohn's disease, unspecified, with intestinal obstruction: Secondary | ICD-10-CM | POA: Diagnosis not present

## 2022-10-22 DIAGNOSIS — K591 Functional diarrhea: Secondary | ICD-10-CM | POA: Diagnosis not present

## 2022-10-22 DIAGNOSIS — E538 Deficiency of other specified B group vitamins: Secondary | ICD-10-CM | POA: Diagnosis not present

## 2022-10-23 ENCOUNTER — Telehealth: Payer: Self-pay | Admitting: Internal Medicine

## 2022-10-23 ENCOUNTER — Other Ambulatory Visit: Payer: Self-pay | Admitting: Internal Medicine

## 2022-10-23 NOTE — Telephone Encounter (Signed)
Pt would like to be called. Pt did not want to tell me what it was about. Pt stated the cma knows what it is about

## 2022-10-23 NOTE — Telephone Encounter (Signed)
MyChart messgae sent to patient. 

## 2022-10-24 NOTE — Telephone Encounter (Signed)
Pt is aware and gave a verbal understanding.
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
## 2022-11-08 ENCOUNTER — Other Ambulatory Visit: Payer: Self-pay | Admitting: Internal Medicine

## 2022-11-14 ENCOUNTER — Ambulatory Visit: Payer: Medicare Other | Admitting: Family Medicine

## 2022-11-15 ENCOUNTER — Encounter: Payer: Self-pay | Admitting: Internal Medicine

## 2022-11-15 ENCOUNTER — Ambulatory Visit (INDEPENDENT_AMBULATORY_CARE_PROVIDER_SITE_OTHER): Payer: Medicare Other | Admitting: Internal Medicine

## 2022-11-15 VITALS — BP 136/70 | HR 102 | Temp 98.1°F | Ht 63.0 in | Wt 93.4 lb

## 2022-11-15 DIAGNOSIS — G8929 Other chronic pain: Secondary | ICD-10-CM | POA: Diagnosis not present

## 2022-11-15 DIAGNOSIS — E538 Deficiency of other specified B group vitamins: Secondary | ICD-10-CM | POA: Diagnosis not present

## 2022-11-15 DIAGNOSIS — K50912 Crohn's disease, unspecified, with intestinal obstruction: Secondary | ICD-10-CM

## 2022-11-15 DIAGNOSIS — T380X5A Adverse effect of glucocorticoids and synthetic analogues, initial encounter: Secondary | ICD-10-CM | POA: Diagnosis not present

## 2022-11-15 DIAGNOSIS — K219 Gastro-esophageal reflux disease without esophagitis: Secondary | ICD-10-CM

## 2022-11-15 DIAGNOSIS — R109 Unspecified abdominal pain: Secondary | ICD-10-CM

## 2022-11-15 DIAGNOSIS — E441 Mild protein-calorie malnutrition: Secondary | ICD-10-CM | POA: Diagnosis not present

## 2022-11-15 DIAGNOSIS — M818 Other osteoporosis without current pathological fracture: Secondary | ICD-10-CM

## 2022-11-15 DIAGNOSIS — R1013 Epigastric pain: Secondary | ICD-10-CM | POA: Diagnosis not present

## 2022-11-15 DIAGNOSIS — I700 Atherosclerosis of aorta: Secondary | ICD-10-CM | POA: Diagnosis not present

## 2022-11-15 LAB — COMPREHENSIVE METABOLIC PANEL
ALT: 20 U/L (ref 0–35)
AST: 24 U/L (ref 0–37)
Albumin: 4.5 g/dL (ref 3.5–5.2)
Alkaline Phosphatase: 48 U/L (ref 39–117)
BUN: 17 mg/dL (ref 6–23)
CO2: 30 mEq/L (ref 19–32)
Calcium: 10.3 mg/dL (ref 8.4–10.5)
Chloride: 101 mEq/L (ref 96–112)
Creatinine, Ser: 0.74 mg/dL (ref 0.40–1.20)
GFR: 79.36 mL/min (ref >60.00)
Glucose, Bld: 117 mg/dL — ABNORMAL HIGH (ref 70–99)
Potassium: 4.1 mEq/L (ref 3.5–5.1)
Sodium: 139 mEq/L (ref 135–145)
Total Bilirubin: 0.4 mg/dL (ref 0.2–1.2)
Total Protein: 6.3 g/dL (ref 6.0–8.3)

## 2022-11-15 LAB — CBC WITH DIFFERENTIAL/PLATELET
Basophils Absolute: 0 10*3/uL (ref 0.0–0.1)
Basophils Relative: 0.4 % (ref 0.0–3.0)
Eosinophils Absolute: 0 10*3/uL (ref 0.0–0.7)
Eosinophils Relative: 0.4 % (ref 0.0–5.0)
HCT: 39 % (ref 36.0–46.0)
Hemoglobin: 12.9 g/dL (ref 12.0–15.0)
Lymphocytes Relative: 50.4 % — ABNORMAL HIGH (ref 12.0–46.0)
Lymphs Abs: 4.3 10*3/uL — ABNORMAL HIGH (ref 0.7–4.0)
MCHC: 33.1 g/dL (ref 30.0–36.0)
MCV: 100.5 fl — ABNORMAL HIGH (ref 78.0–100.0)
Monocytes Absolute: 0.7 10*3/uL (ref 0.1–1.0)
Monocytes Relative: 8.7 % (ref 3.0–12.0)
Neutro Abs: 3.4 10*3/uL (ref 1.4–7.7)
Neutrophils Relative %: 40.1 % — ABNORMAL LOW (ref 43.0–77.0)
Platelets: 298 10*3/uL (ref 150.0–400.0)
RBC: 3.88 Mil/uL (ref 3.87–5.11)
RDW: 15.2 % (ref 11.5–15.5)
WBC: 8.5 10*3/uL (ref 4.0–10.5)

## 2022-11-15 LAB — H. PYLORI ANTIBODY, IGG: H Pylori IgG: NEGATIVE

## 2022-11-15 LAB — LIPASE: Lipase: 33 U/L (ref 11.0–59.0)

## 2022-11-15 MED ORDER — SUCRALFATE 1 GM/10ML PO SUSP: 1.0000 g | Freq: Three times a day (TID) | ORAL | 0 refills | Status: DC

## 2022-11-15 NOTE — Assessment & Plan Note (Signed)
Reviewed her need for statin use due to  findings of prior CT scan today..  Patient is tolerating statin therapy with 10 mg crestor once daily and LDL is at goal.  LFTS are normal   Lab Results  Component Value Date   CHOL 131 09/12/2022   HDL 65.60 09/12/2022   LDLCALC 43 09/12/2022   LDLDIRECT 43.0 09/12/2022   TRIG 110.0 09/12/2022   CHOLHDL 2 09/12/2022     Lab Results  Component Value Date   ALT 20 11/15/2022   AST 24 11/15/2022   ALKPHOS 48 11/15/2022   BILITOT 0.4 11/15/2022

## 2022-11-15 NOTE — Patient Instructions (Addendum)
I am prescribing carafate to use three times daily on an empty stomach.  Continue omeprazole twice daily .  Take your omeprazole at least 30 minutes prior to your carafate dose   Pleas obtain a stool sample for the fecal occult blood test I sent you home with.   Please keep the appointment with Dr Alice Reichert in mid January    Please call the pulmonary clinic to set up your CT scan

## 2022-11-15 NOTE — Progress Notes (Unsigned)
Subjective:  Patient ID: Tamara [name], female    DOB: 03-08-1946  Age: 77 y.o. MRN: [id number]  CC: There were no encounter diagnoses.   HPI CHALSEA DARKO presents for  Chief Complaint  Patient presents with   Abdominal Pain    77 yr old female with Crohn's disease, tobacco abuse , GERD and hiatal hernia s/p Nissen fundoplication  presents with persistent nausea and  abd pain since   mid December.  States that her stools have been liquid since Dec ,  averages 2 stools daily.  No solid stools for the last month.  . Weight loss of at let 3 lbs since November  DUE TO PAIN and nausea brought eating and smoking .  The burning sensation is 24/7 . She has been  TAKING OMEPRAZOLE 40 MG BID AND has not taken any NSAIDS FOR YEARS.     Denies use of alcohol.   Remote history of peptic ulcer (per patient(.  Last 2 EGDS in 2019 and 2017 reviewed.   Has been dry heaving daily.   Increased emotional stress due to family issues.   Self administers  b12 monthly                  Outpatient Medications Prior to Visit  Medication Sig Dispense Refill   clonazePAM (KLONOPIN) 0.5 MG tablet Take 1 tablet (0.5 mg total) by mouth daily as needed for anxiety. 30 tablet 5   cyanocobalamin (,VITAMIN B-12,) 1000 MCG/ML injection Inject 1 mL (1,000 mcg total) into the muscle every 30 (thirty) days. 10 mL 2   nortriptyline (PAMELOR) 25 MG capsule TAKE 1 CAPSULE BY MOUTH AT BEDTIME 90 capsule 1   omeprazole (PRILOSEC) 40 MG capsule TAKE 1 CAPSULE BY MOUTH TWICE DAILY 180 capsule 1   ondansetron (ZOFRAN-ODT) 4 MG disintegrating tablet Take 4 mg by mouth every 8 (eight) hours as needed.     rosuvastatin (CRESTOR) 10 MG tablet TAKE 1 TABLET BY MOUTH AT BEDTIME 90 tablet 1   sertraline (ZOLOFT) 100 MG tablet TAKE 1 TABLET BY MOUTH ONCE DAILY 90 tablet 1   Syringe/Needle, Disp, (SYRINGE 3CC/25GX1") 25G X 1" 3 ML MISC Use for b12 injections 50 each 0   traZODone (DESYREL) 100 MG tablet TAKE 1 TABLET BY MOUTH AT  BEDTIME 90 tablet 1   traZODone (DESYREL) 50 MG tablet Take 2 tablets (100 mg total) by mouth at bedtime. 180 tablet 1   No facility-administered medications prior to visit.    Review of Systems;  Patient denies headache, fevers, malaise, unintentional weight loss, skin rash, eye pain, sinus congestion and sinus pain, sore throat, dysphagia,  hemoptysis , cough, dyspnea, wheezing, chest pain, palpitations, orthopnea, edema, abdominal pain, nausea, melena, diarrhea, constipation, flank pain, dysuria, hematuria, urinary  Frequency, nocturia, numbness, tingling, seizures,  Focal weakness, Loss of consciousness,  Tremor, insomnia, depression, anxiety, and suicidal ideation.      Objective:  BP 136/70   Pulse (!) 102   Temp 98.1 F (36.7 C) (Oral)   Ht '5\' 3"'$  (1.6 m)   Wt 93 lb 6.4 oz (42.4 kg)   SpO2 97%   BMI 16.55 kg/m   BP Readings from Last 3 Encounters:  11/15/22 136/70  09/12/22 120/68  06/27/22 124/70    Wt Readings from Last 3 Encounters:  11/15/22 93 lb 6.4 oz (42.4 kg)  09/12/22 96 lb 6.4 oz (43.7 kg)  06/27/22 92 lb (41.7 kg)    Physical Exam  Lab Results  Component Value Date   HGBA1C 6.3 09/12/2022   HGBA1C 6.1 09/21/2020    Lab Results  Component Value Date   CREATININE 0.74 09/12/2022   CREATININE 0.78 04/19/2022   CREATININE 0.75 04/01/2022    Lab Results  Component Value Date   WBC 9.1 09/12/2022   HGB 11.7 (L) 09/12/2022   HCT 35.6 (L) 09/12/2022   PLT 292.0 09/12/2022   GLUCOSE 94 09/12/2022   CHOL 131 09/12/2022   TRIG 110.0 09/12/2022   HDL 65.60 09/12/2022   LDLDIRECT 43.0 09/12/2022   LDLCALC 43 09/12/2022   ALT 18 09/12/2022   AST 22 09/12/2022   NA 140 09/12/2022   K 4.4 09/12/2022   CL 107 09/12/2022   CREATININE 0.74 09/12/2022   BUN 16 09/12/2022   CO2 26 09/12/2022   TSH 1.32 09/12/2022   INR 1.0 04/19/2022   HGBA1C 6.3 09/12/2022    MR LUMBAR SPINE WO CONTRAST  Result Date: 07/09/2022 CLINICAL DATA:  Low back pain  radiating down the right leg for the past 2 months. Prior fusion in May. EXAM: MRI LUMBAR SPINE WITHOUT CONTRAST TECHNIQUE: Multiplanar, multisequence MR imaging of the lumbar spine was performed. No intravenous contrast was administered. COMPARISON:  Lumbar spine x-rays dated June 27, 2022. MRI lumbar spine dated February 05, 2022. FINDINGS: Segmentation:  Standard. Alignment:  New trace retrolisthesis at L3-L4. Vertebrae:  No fracture, evidence of discitis, or bone lesion. Conus medullaris and cauda equina: Conus extends to the L1 level. Conus and cauda equina appear normal. Paraspinal and other soft tissues: New postsurgical changes in the lower lumbar paraspinous soft tissues. No fluid collection. Disc levels: T12-L1:  Negative. L1-L2:  Negative. L2-L3: Unchanged mild disc bulging with superimposed small left foraminal disc protrusion. No stenosis. L3-L4: Progressive mild disc bulging and unchanged bilateral facet arthropathy. Unchanged mild spinal canal and bilateral lateral recess stenosis. New mild right and unchanged mild left neuroforaminal stenosis. L4-L5: Interval PLIF. Resolved right lateral recess and neuroforaminal stenosis. No spinal canal or left neuroforaminal stenosis. L5-S1: Unchanged mild disc bulging with central annular fissure. Unchanged mild bilateral facet arthropathy. No stenosis. IMPRESSION: 1. Interval PLIF at L4-L5 with resolved right lateral recess and neuroforaminal stenosis. 2. Progressive mild degenerative disc disease at L3-L4 with new mild right neuroforaminal stenosis. Electronically Signed   By: Titus Dubin M.D.   On: 07/09/2022 12:24    Assessment & Plan:  .There are no diagnoses linked to this encounter.   I provided 30 minutes of face-to-face time during this encounter reviewing patient's last visit with me, patient's  most recent visit with cardiology,  nephrology,  and neurology,  recent surgical and non surgical procedures, previous  labs and imaging studies,  counseling on currently addressed issues,  and post visit ordering to diagnostics and therapeutics .   Follow-up: No follow-ups on file.   Crecencio Mc, MD

## 2022-11-16 ENCOUNTER — Encounter: Payer: Self-pay | Admitting: Internal Medicine

## 2022-11-16 NOTE — Assessment & Plan Note (Addendum)
He has lost several lbs over the last 4 weeks due to recurrent post prandial abdominal pain .  Ordering CT abd and pelvis to expedite workup

## 2022-11-16 NOTE — Assessment & Plan Note (Signed)
Secondary to Crohn's of the small intestine. She has been reminded that she  will need to continue lifelong monthly injections

## 2022-11-16 NOTE — Assessment & Plan Note (Signed)
Secondary to IBD and adhesions.  However her current pain is of a different quality and suggests peptic ulcer disease despite bid use of PPI.  Adding carafate as a trial; she has an appt with GI in 2 weeks .  Given her aortic atherosclerosis, and continued tobacco abuse,   mesenteric ischemia is also a  possibility

## 2022-11-16 NOTE — Assessment & Plan Note (Signed)
Managed by to [facility] , last Reclast infusion in April 2022.  

## 2022-11-16 NOTE — Assessment & Plan Note (Addendum)
Her disease is managed by Dr Alice Reichert,  Last colonoscopy done in 2021. Her current pain complaint is atypical for Crohn's flare.

## 2022-11-16 NOTE — Assessment & Plan Note (Signed)
Managed with PPI bid and s/p Nissen fundoplication .  Adding carafate for symptoms suggestive of peptic ulcer disease

## 2022-11-21 IMAGING — MR MR KNEE*L* W/O CM
7 series · 40 of 40 positions shown · non-contrast
Comparison: 05/27/2020

CLINICAL DATA: Left posterior knee pain.

EXAM:
MRI OF THE LEFT KNEE WITHOUT CONTRAST
TECHNIQUE: Multiplanar, multisequence MR imaging of the knee was performed. No
intravenous contrast was administered.

[Series 12: T2 fat-sat · axial · left · 4.0mm · 0.50mm/px · z∈[-101,+24]mm · 6 of 26 slices shown (1 of 3)]
[im 1/26]
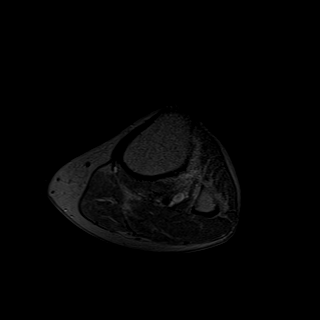
[im 6/26]
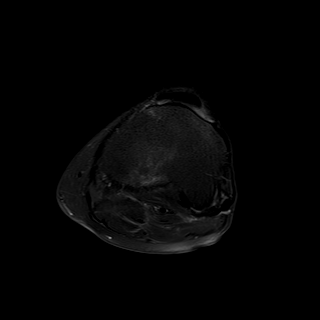
[im 11/26]
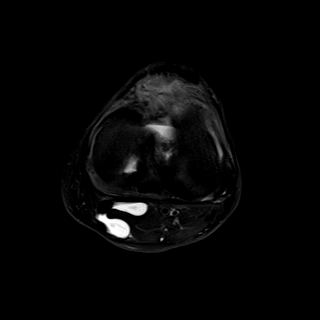
[im 16/26]
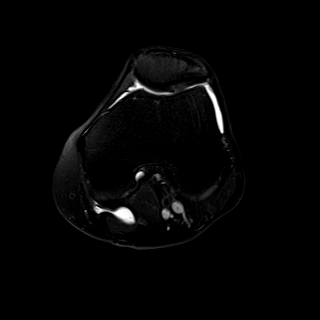
[im 21/26]
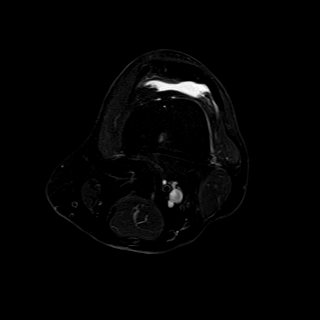
[im 26/26]
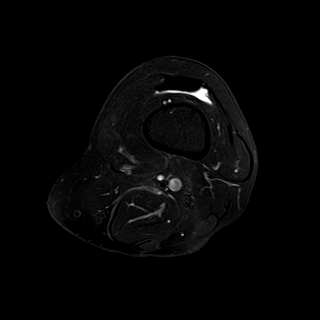

[Series 13: T2 fat-sat · coronal · left · 4.0mm · 0.59mm/px · 5 of 22 slices shown (2 of 3)]
[im 1/22]
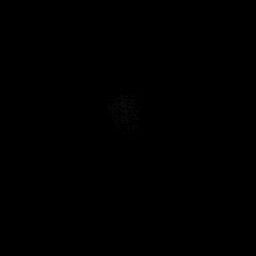
[im 6/22]
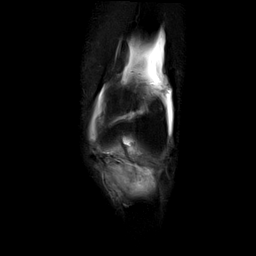
[im 11/22]
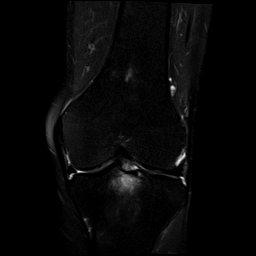
[im 16/22]
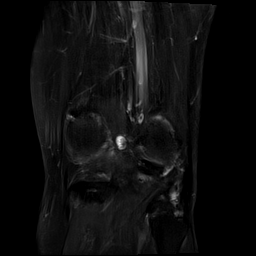
[im 22/22]
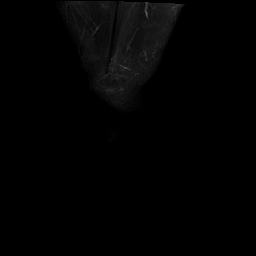

[Series 14: T1 · coronal · left · 4.0mm · 0.59mm/px · 6 of 22 slices shown]
[im 1/22]
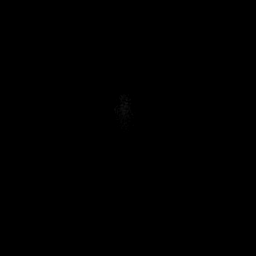
[im 5/22]
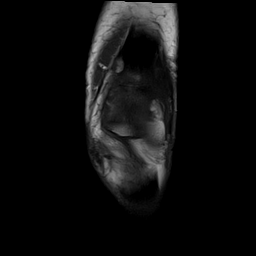
[im 9/22]
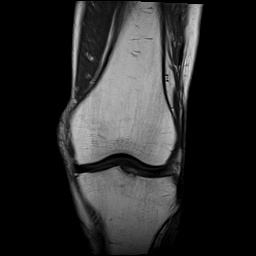
[im 13/22]
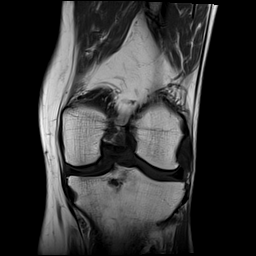
[im 17/22]
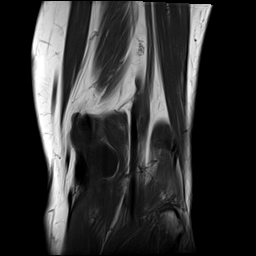
[im 22/22]
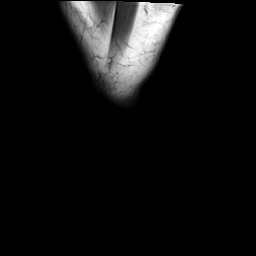

[Series 15: PD fat-sat · coronal · left · 4.0mm · 0.59mm/px · 6 of 22 slices shown (1 of 2)]
[im 1/22]
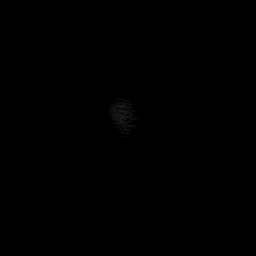
[im 5/22]
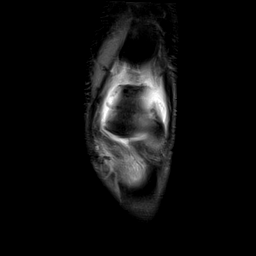
[im 9/22]
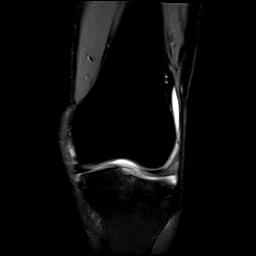
[im 13/22]
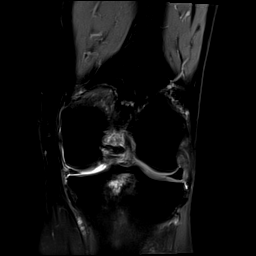
[im 17/22]
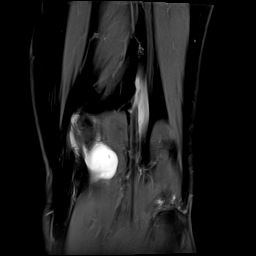
[im 22/22]
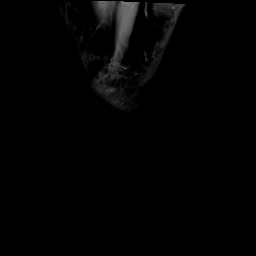

[Series 16: PD fat-sat · sagittal · left · 3.0mm · 0.59mm/px · 6 of 25 slices shown (2 of 2)]
[im 1/25]
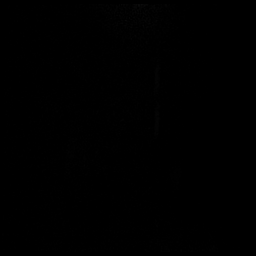
[im 5/25]
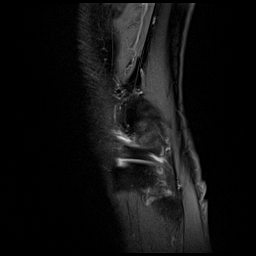
[im 10/25]
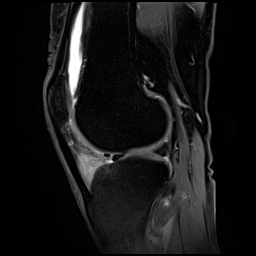
[im 15/25]
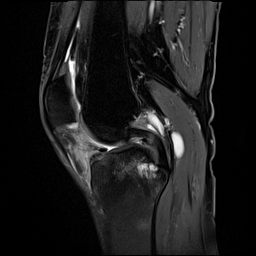
[im 20/25]
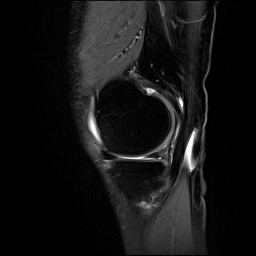
[im 25/25]
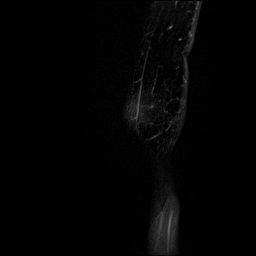

[Series 17: T2 fat-sat · sagittal · left · 3.0mm · 0.59mm/px · 7 of 26 slices shown (3 of 3)]
[im 1/26]
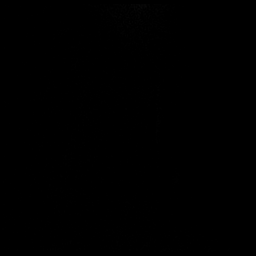
[im 5/26]
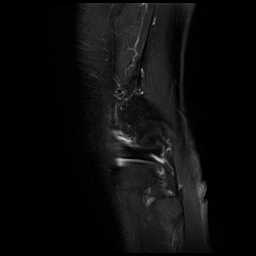
[im 9/26]
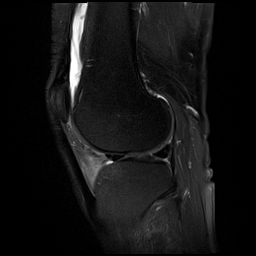
[im 13/26]
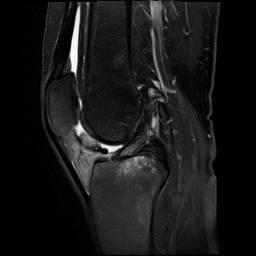
[im 17/26]
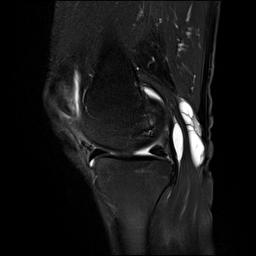
[im 21/26]
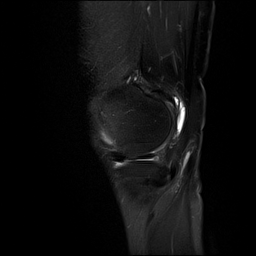
[im 26/26]
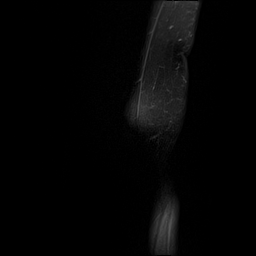

[Series 18: PD · oblique · left · 2.0mm · 0.47mm/px · 4 of 16 slices shown]
[im 1/16]
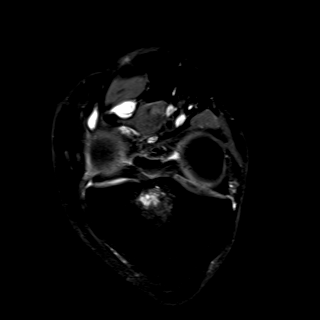
[im 6/16]
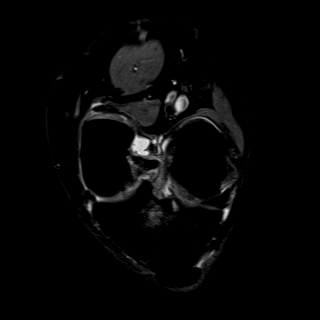
[im 11/16]
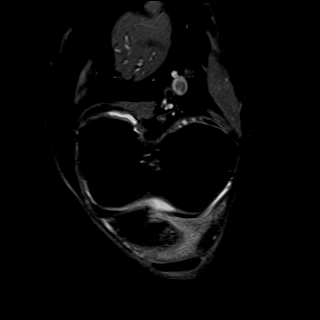
[im 16/16]
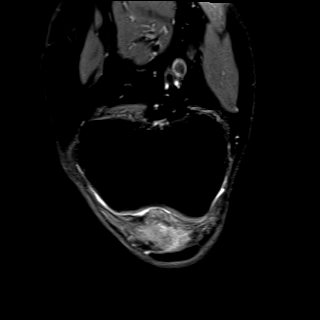

[40 of 40 positions shown; findings below may reference images not displayed]

FINDINGS: MENISCI

Medial: Attenuation of the body and posterior horn of the medial
meniscus consistent with prior partial meniscectomy. No new medial
meniscal tear.

Lateral: Degeneration of the posterior horn of the lateral meniscus
towards the meniscal root. No discrete lateral meniscal tear.

LIGAMENTS

Cruciates: Intact ACL and PCL. Mild increased signal in the ACL
likely reflecting mild degeneration. Subcortical reactive marrow
changes at the ACL insertion.

Collaterals: Medial collateral ligament is intact. Lateral
collateral ligament complex is intact.

CARTILAGE

Patellofemoral:  Cartilage fissuring of the lateral patellar facet.

Medial: Partial-thickness cartilage loss of the medial femorotibial
compartment.

Lateral: Mild partial-thickness cartilage loss of the lateral
femorotibial compartment.

JOINT: Moderate joint effusion. Mild synovitis around the ACL and
PCL. Severe extensive edema in Hoffa's fat as can be seen with
Hoffitis. No plical thickening.

POPLITEAL FOSSA: Popliteus tendon is intact. Moderate size Baker's
cyst.

EXTENSOR MECHANISM: Intact quadriceps tendon. Intact patellar
tendon. Intact lateral patellar retinaculum. Intact medial patellar
retinaculum. Intact MPFL.

BONES: No aggressive osseous lesion. No fracture or dislocation.

Other: No fluid collection or hematoma. Muscles are normal.
IMPRESSION: 1. Attenuation of the body and posterior horn of the medial meniscus
consistent with prior partial meniscectomy. No new medial meniscal
tear.
2. Degeneration of the posterior horn of the lateral meniscus
towards the meniscal root. No discrete lateral meniscal tear.
3. Moderate joint effusion.
4. Moderate size Baker's cyst.
5. Severe extensive edema in Hoffa's fat as can be seen with
Hoffitis.
6. Tricompartmental cartilage abnormalities as described above.

## 2022-11-27 ENCOUNTER — Ambulatory Visit
Admission: RE | Admit: 2022-11-27 | Discharge: 2022-11-27 | Disposition: A | Discharge status: Home / Self Care | Payer: Medicare Other | Source: Ambulatory Visit | Attending: Internal Medicine | Admitting: Internal Medicine

## 2022-11-27 DIAGNOSIS — R1013 Epigastric pain: Secondary | ICD-10-CM | POA: Diagnosis not present

## 2022-11-27 DIAGNOSIS — G8929 Other chronic pain: Secondary | ICD-10-CM | POA: Insufficient documentation

## 2022-11-27 DIAGNOSIS — I700 Atherosclerosis of aorta: Secondary | ICD-10-CM

## 2022-11-27 DIAGNOSIS — K50912 Crohn's disease, unspecified, with intestinal obstruction: Secondary | ICD-10-CM | POA: Insufficient documentation

## 2022-11-27 DIAGNOSIS — R109 Unspecified abdominal pain: Secondary | ICD-10-CM | POA: Insufficient documentation

## 2022-11-27 MED ORDER — IOHEXOL 300 MG/ML  SOLN
80.0000 mL | Freq: Once | INTRAMUSCULAR | Status: AC | PRN
  Administered 2022-11-27: 80 mL via INTRAVENOUS

## 2022-11-28 DIAGNOSIS — K5090 Crohn's disease, unspecified, without complications: Secondary | ICD-10-CM | POA: Diagnosis not present

## 2022-11-28 DIAGNOSIS — K219 Gastro-esophageal reflux disease without esophagitis: Secondary | ICD-10-CM | POA: Diagnosis not present

## 2022-11-28 DIAGNOSIS — K50912 Crohn's disease, unspecified, with intestinal obstruction: Secondary | ICD-10-CM | POA: Diagnosis not present

## 2022-11-28 DIAGNOSIS — Y638 Failure in dosage during other surgical and medical care: Secondary | ICD-10-CM | POA: Diagnosis not present

## 2022-11-28 DIAGNOSIS — Z8619 Personal history of other infectious and parasitic diseases: Secondary | ICD-10-CM | POA: Diagnosis not present

## 2022-11-28 DIAGNOSIS — R1314 Dysphagia, pharyngoesophageal phase: Secondary | ICD-10-CM | POA: Diagnosis not present

## 2022-11-28 DIAGNOSIS — R110 Nausea: Secondary | ICD-10-CM | POA: Diagnosis not present

## 2022-11-28 DIAGNOSIS — R636 Underweight: Secondary | ICD-10-CM | POA: Diagnosis not present

## 2022-12-10 ENCOUNTER — Other Ambulatory Visit: Payer: Self-pay | Admitting: Internal Medicine

## 2022-12-11 IMAGING — US US EXTREM LOW VENOUS*L*
1 series · 13 of 24 positions shown · non-contrast
Comparison: None.

CLINICAL DATA: Left lower extremity pain, edema and bruising.
Recent surgery 1 week ago



[Series 1: us venous img lower uni left (dvt) · portal-venous · 13 of 36 slices shown]
[im 1/36]
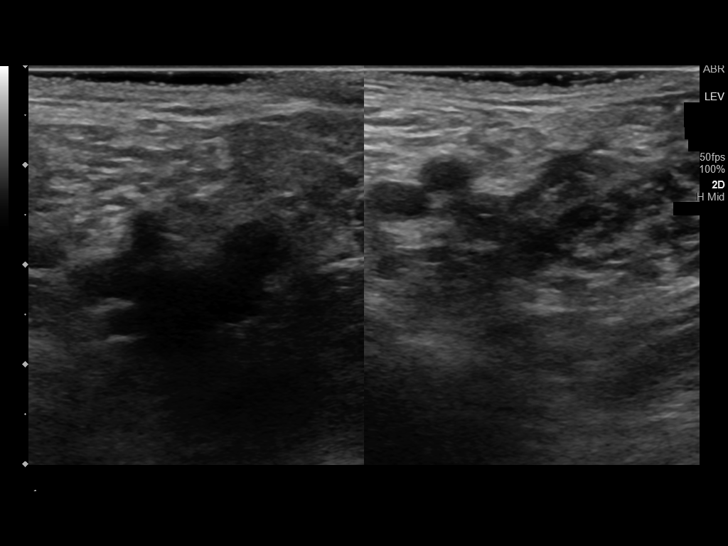
[im 4/36]
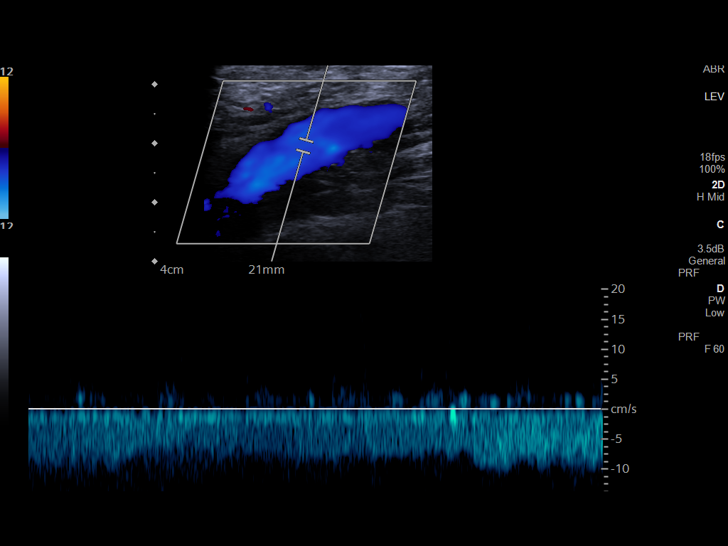
[im 7/36]
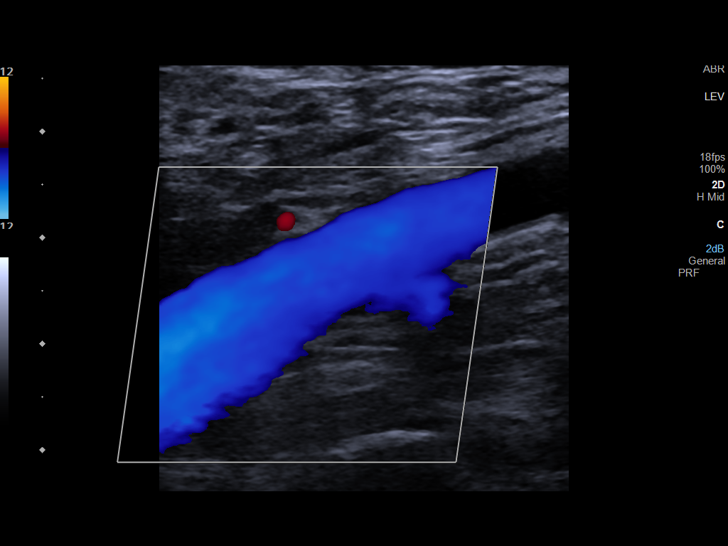
[im 10/36]
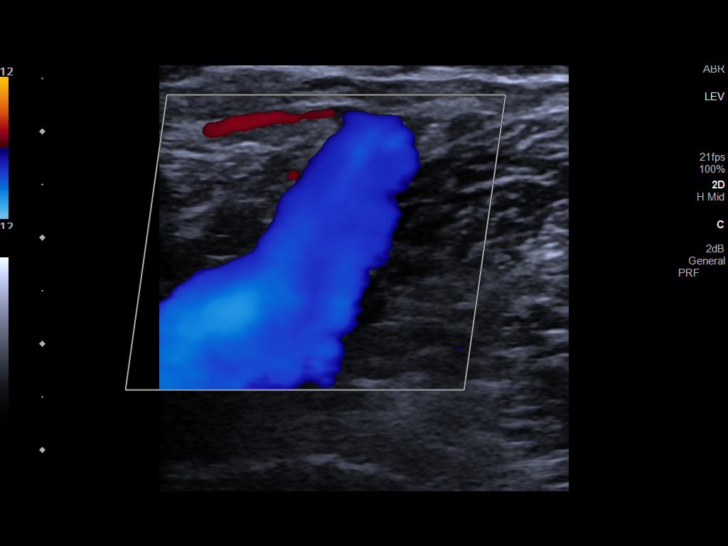
[im 13/36]
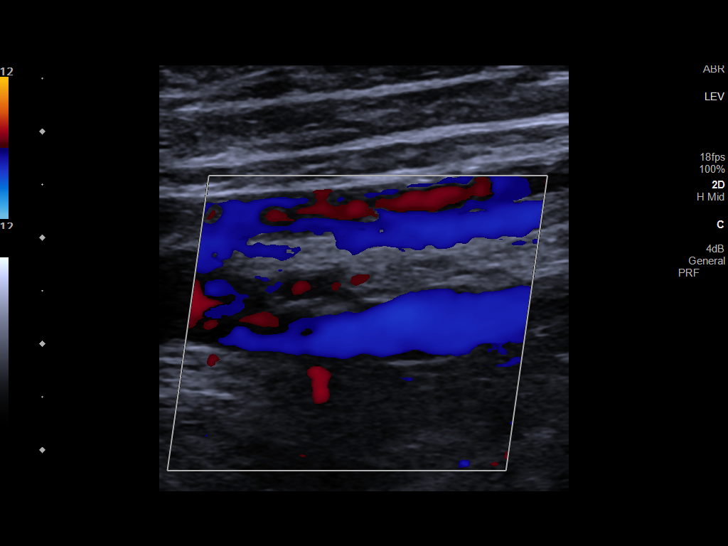
[im 16/36]
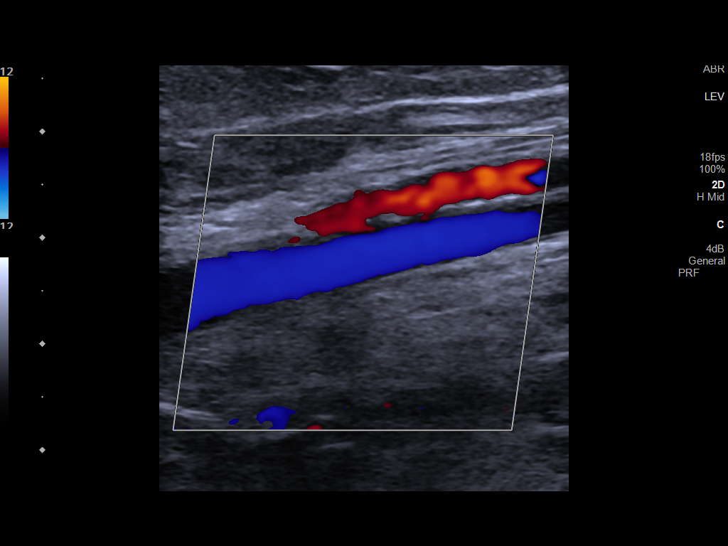
[im 19/36]
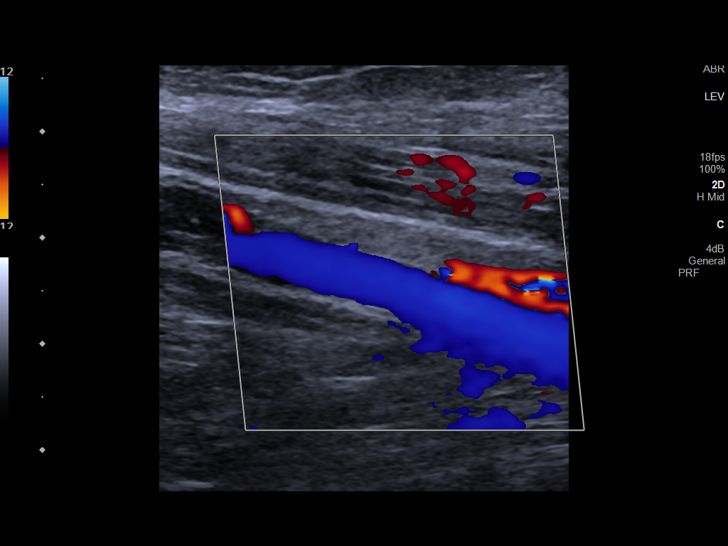
[im 20/36]
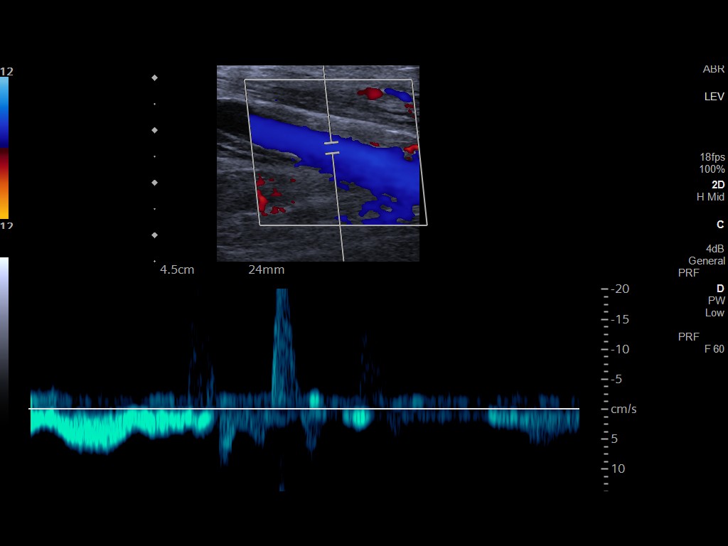
[im 23/36]
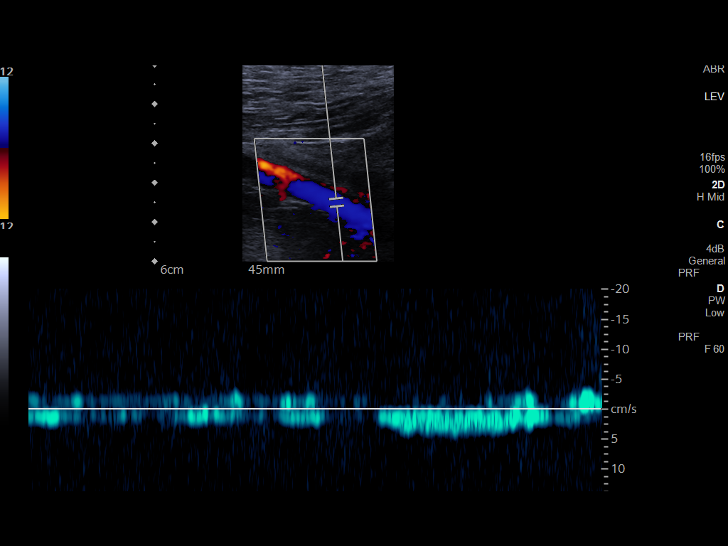
[im 26/36]
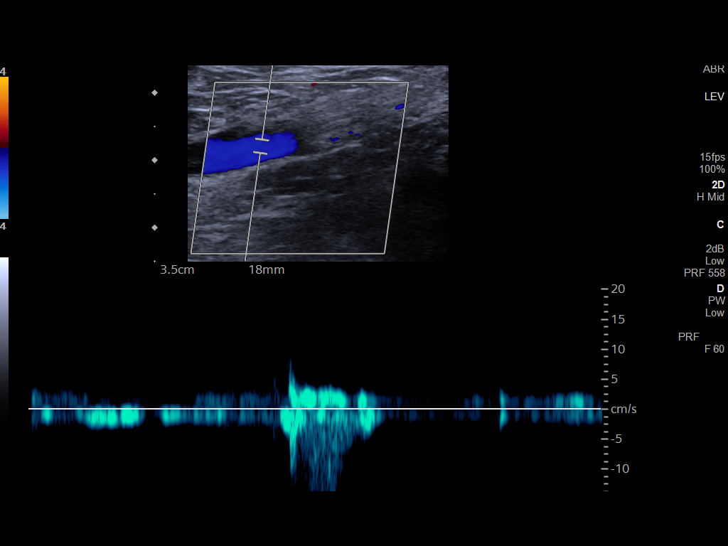
[im 29/36]
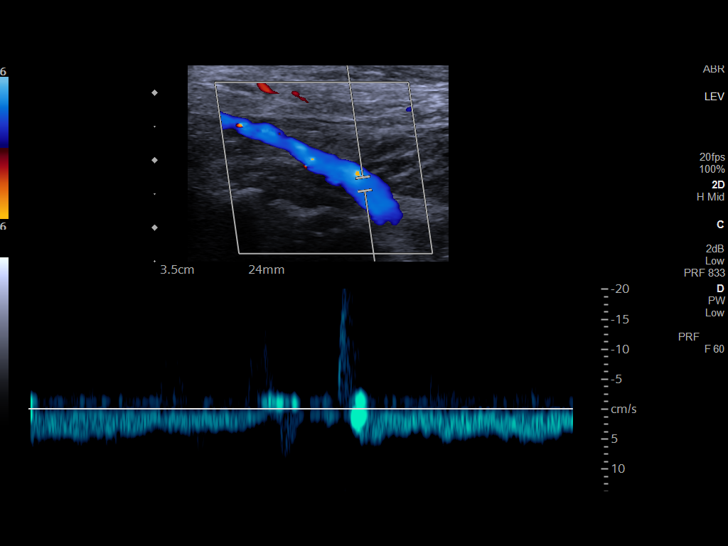
[im 32/36]
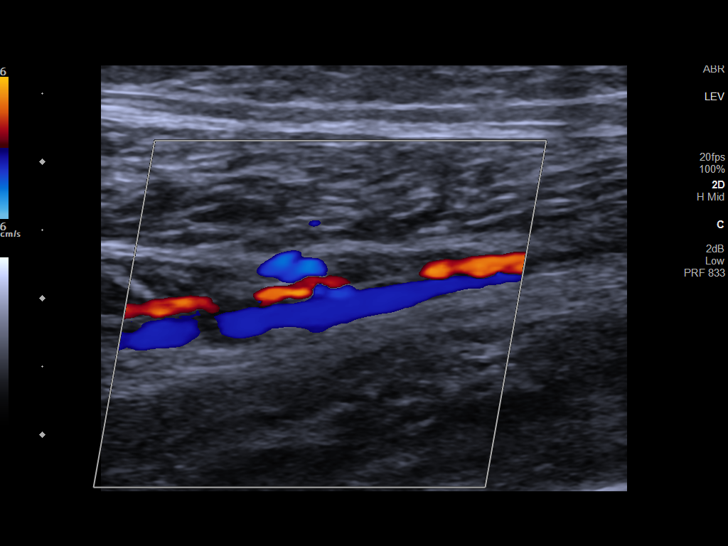
[im 36/36]
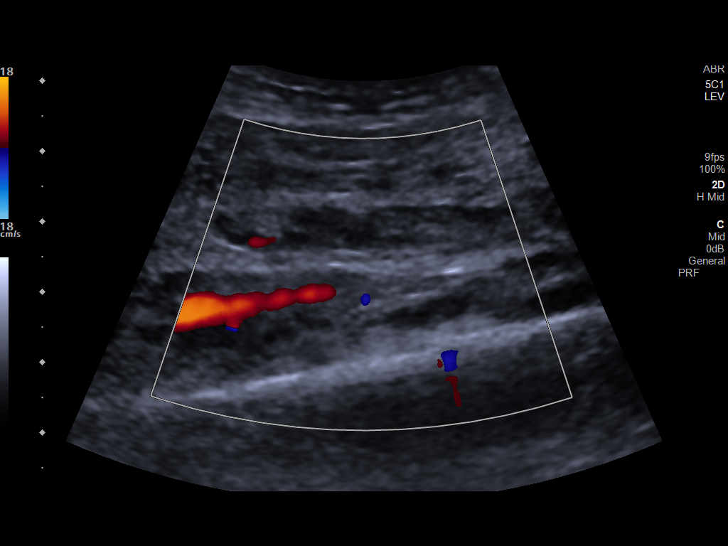

[13 of 24 positions shown; findings below may reference images not displayed]

FINDINGS: Contralateral Common Femoral Vein: Respiratory phasicity is normal
and symmetric with the symptomatic side. No evidence of thrombus.
Normal compressibility.

Common Femoral Vein: No evidence of thrombus. Normal
compressibility, respiratory phasicity and response to augmentation.

Saphenofemoral Junction: No evidence of thrombus. Normal
compressibility and flow on color Doppler imaging.

Profunda Femoral Vein: No evidence of thrombus. Normal
compressibility and flow on color Doppler imaging.

Femoral Vein: No evidence of thrombus. Normal compressibility,
respiratory phasicity and response to augmentation.

Popliteal Vein: No evidence of thrombus. Normal compressibility,
respiratory phasicity and response to augmentation.

Calf Veins: Posterior tibial vein appears patent and compressible.
Left peroneal vein is noncompressible without definite color flow
with augmentation compatible with calf region left peroneal DVT.
Very low thrombus burden. No propagation into the popliteal or
femoral veins.
IMPRESSION: Isolated left peroneal calf DVT.  Very low thrombus burden.

## 2022-12-12 ENCOUNTER — Other Ambulatory Visit: Payer: Self-pay | Admitting: Internal Medicine

## 2022-12-17 ENCOUNTER — Other Ambulatory Visit: Payer: Self-pay | Admitting: Internal Medicine

## 2022-12-17 DIAGNOSIS — R011 Cardiac murmur, unspecified: Secondary | ICD-10-CM | POA: Diagnosis not present

## 2022-12-17 DIAGNOSIS — Z8673 Personal history of transient ischemic attack (TIA), and cerebral infarction without residual deficits: Secondary | ICD-10-CM | POA: Diagnosis not present

## 2022-12-17 DIAGNOSIS — K219 Gastro-esophageal reflux disease without esophagitis: Secondary | ICD-10-CM | POA: Diagnosis not present

## 2022-12-17 DIAGNOSIS — I700 Atherosclerosis of aorta: Secondary | ICD-10-CM | POA: Diagnosis not present

## 2022-12-17 DIAGNOSIS — R0789 Other chest pain: Secondary | ICD-10-CM | POA: Diagnosis not present

## 2022-12-17 DIAGNOSIS — R079 Chest pain, unspecified: Secondary | ICD-10-CM

## 2022-12-17 DIAGNOSIS — R0602 Shortness of breath: Secondary | ICD-10-CM

## 2022-12-17 DIAGNOSIS — G4733 Obstructive sleep apnea (adult) (pediatric): Secondary | ICD-10-CM | POA: Diagnosis not present

## 2022-12-17 DIAGNOSIS — J449 Chronic obstructive pulmonary disease, unspecified: Secondary | ICD-10-CM | POA: Diagnosis not present

## 2022-12-17 DIAGNOSIS — Z86718 Personal history of other venous thrombosis and embolism: Secondary | ICD-10-CM | POA: Diagnosis not present

## 2022-12-19 DIAGNOSIS — M810 Age-related osteoporosis without current pathological fracture: Secondary | ICD-10-CM | POA: Diagnosis not present

## 2022-12-19 DIAGNOSIS — E559 Vitamin D deficiency, unspecified: Secondary | ICD-10-CM | POA: Diagnosis not present

## 2022-12-25 DIAGNOSIS — M810 Age-related osteoporosis without current pathological fracture: Secondary | ICD-10-CM | POA: Diagnosis not present

## 2023-01-04 ENCOUNTER — Ambulatory Visit
Admission: EM | Admit: 2023-01-04 | Discharge: 2023-01-04 | Disposition: A | Discharge status: Home / Self Care | Payer: Medicare Other | Attending: Emergency Medicine | Admitting: Emergency Medicine

## 2023-01-04 ENCOUNTER — Ambulatory Visit (INDEPENDENT_AMBULATORY_CARE_PROVIDER_SITE_OTHER): Payer: Medicare Other

## 2023-01-04 DIAGNOSIS — J439 Emphysema, unspecified: Secondary | ICD-10-CM | POA: Diagnosis not present

## 2023-01-04 DIAGNOSIS — J441 Chronic obstructive pulmonary disease with (acute) exacerbation: Secondary | ICD-10-CM | POA: Insufficient documentation

## 2023-01-04 DIAGNOSIS — R059 Cough, unspecified: Secondary | ICD-10-CM

## 2023-01-04 DIAGNOSIS — R0602 Shortness of breath: Secondary | ICD-10-CM

## 2023-01-04 LAB — GROUP A STREP BY PCR: Group A Strep by PCR: NOT DETECTED

## 2023-01-04 MED ORDER — LEVOFLOXACIN 500 MG PO TABS: 500.0000 mg | ORAL_TABLET | Freq: Every day | ORAL | 0 refills | Status: DC

## 2023-01-04 MED ORDER — GUAIFENESIN-CODEINE 100-10 MG/5ML PO SYRP: 5.0000 mL | ORAL_SOLUTION | Freq: Three times a day (TID) | ORAL | 0 refills | Status: DC | PRN

## 2023-01-04 MED ORDER — AEROCHAMBER MV MISC: 2 refills | Status: DC

## 2023-01-04 MED ORDER — BENZONATATE 100 MG PO CAPS: 200.0000 mg | ORAL_CAPSULE | Freq: Three times a day (TID) | ORAL | 0 refills | Status: DC

## 2023-01-04 MED ORDER — PREDNISONE 20 MG PO TABS: 60.0000 mg | ORAL_TABLET | Freq: Every day | ORAL | 0 refills | Status: AC

## 2023-01-04 MED ORDER — ALBUTEROL SULFATE HFA 108 (90 BASE) MCG/ACT IN AERS: 2.0000 | INHALATION_SPRAY | RESPIRATORY_TRACT | 0 refills | Status: DC | PRN

## 2023-01-04 NOTE — ED Triage Notes (Signed)
Pt reports that she's had a cough, fever, sore throat, congestion.Headache x1 week. Hx of pneumonia. She's taken dayquil and nyquil. Asprin and nasal spray.  She states nothing is going to help with the cough but cough syrup with codeine. Chest is sore from coughing. Took tylenol at 4am.

## 2023-01-04 NOTE — Discharge Instructions (Addendum)
Take the Levaquin 500 mg once daily for 7 days for treatment of your COPD exacerbation.  Use your albuterol inhaler, 1-2 puffs every 4-6 hours as needed for shortness breath or wheezing.  Take the prednisone 60 mg daily at breakfast time starting today.  Use the Tessalon Perles every 8 hours as needed for cough during the day.  They will sometimes cause numbness to the base of your tongue or give you metallic taste in her mouth.  This is normal.  You will need to take them with a small sip of water.  Use the Cheratussin at bedtime as needed for cough and congestion.  Be mindful of this medication will make you drowsy so have to get up in the middle of night to use the bathroom make sure you have your bearings about you before you stand up and try to walk.  If you develop any new symptoms please return for reevaluation or see your primary care provider.  If you develop any worsening shortness of breath, fevers, or chest pain please go to the ER for evaluation.

## 2023-01-04 NOTE — ED Provider Notes (Signed)
[facility]    CSN: [id number] Arrival date & time: 01/04/23  0800      History   Chief Complaint Chief Complaint  Patient presents with   Fever   Cough   Nasal Congestion   Headache   Sore Throat    HPI Tamara [name] is a 77 y.o. female.   HPI  77 year old female here for evaluation of respiratory complaints.  The patient has a past medical history that is significant for emphysema, cerebrovascular and cardiovascular disease, obstructive sleep apnea, steroid-induced osteoporosis and pneumonia who presents for evaluation of 1 week of fever with a Tmax of 101 last night, nasal congestion, sore throat, headache, nonproductive cough, shortness breath, and wheezing.  The patient is a smoker but states she has not been smoking for the past week due to the cough.  Past Medical History:  Diagnosis Date   Acute deep vein thrombosis (DVT) of left lower extremity ([place]) 05/2021   Acute deep vein thrombosis (DVT) of right lower extremity ([place]) 01/2007   a.) occurred postoperatively; s/p IVC filter placement   Acute deep vein thrombosis (DVT) of right lower extremity ([place]) 04/24/2018   Amaurosis fugax 12/13/2014    Brain MRA suggested vertebral artery partial occlusion   75 to 90% stenosed. Will treat for prevention of embolic stroke,  Refer to [facility] for CTA and stent and refer to cardiology for ECHO    Anticoagulated on Coumadin 07/06/2021   New regimen 6 mg 5 days per week,  5 mg 2 days per week given INR of 1.9  On 5/6alternating         Lab Results  Component  Value  Date     INR  1.9 (H)  07/06/2021     INR  2.9 (H)  06/18/2021     INR  1.6 (H)  06/11/2021     3.1 on 12/06/21 INR, held one dose of coumadin since on 5 additional days of augmentin, she has restarted her current dose of Coumadin '5mg'$  tablet once with coumadin '1mg'$  table   Anxiety    a.) on BZO (clonazepam) PRN   Aortic atherosclerosis (HCC)    Arthritis    B12 deficiency    Carpal  tunnel syndrome    Carpal tunnel syndrome 03/08/2014   Cataracts, bilateral    Clostridium difficile infection 05/24/2021   COPD (chronic obstructive pulmonary disease) (HCC)    no inhalers   Crohn's disease ([place])    Depression    Dog bite 12/06/2021   Dysphagia 12/04/2017   Facial twitching 07/11/2018   GERD (gastroesophageal reflux disease)    History of cardiac catheterization 06/14/1997   a.) LHC 06/14/1997 at [facility]: EF 60%; normal coronaries; no obstructive CAD.   History of hiatal hernia    History of kidney stones    Hx of splenectomy    a.) reports spleen was "ruptured" during routine colonoscopy in [place], which necessitated surgical intervention   Intractable chronic migraine without aura and without status migrainosus 05/20/2019   Leukocytosis    Long term current use of anticoagulant    a.) warfarin   Moderate tricuspid insufficiency    MRSA colonization 12/2016   Osteoporosis    Pneumonia    Reflux esophagitis 01/24/2018   Sleep apnea    Small bowel obstruction ([place])    a.) 2001, 2004 (reversal of jejunal bypass). b.) s/p laparotomy and lysis of adhesions 01/2007   Spinal stenosis of lumbar region    Vitamin  D deficiency     Patient Active Problem List   Diagnosis Date Noted   S/P spinal fusion 04/01/2022   Apraxia 03/31/2022   Generalized anxiety disorder 05/06/2021   Cerebrovascular disease 07/31/2020   Aortic atherosclerosis ([place]) 07/19/2020   Coronary atherosclerosis due to calcified coronary lesion 07/19/2020   Emphysema with chronic bronchitis 01/28/2020   OSA (obstructive sleep apnea) 08/01/2019   GERD (gastroesophageal reflux disease) 04/28/2019   Insomnia due to anxiety and fear 07/11/2018   History of repair of laceration 04/28/2018   Malnutrition ([place]) 02/20/2018   Macrocytosis 12/04/2017   Primary osteoarthritis of left knee 06/24/2017   Spinal stenosis, lumbar region, with neurogenic claudication 11/08/2016   Polycythemia, secondary 10/24/2016    Medicare annual wellness visit, subsequent 04/16/2016   Underweight 08/27/2015   Vitamin D deficiency 08/27/2015   Uric acid arthropathy 10/08/2014   B12 deficiency 01/26/2014   History of tobacco abuse 01/26/2014   Encounter for preventive health examination 05/11/2013   Steroid-induced osteoporosis 11/19/2011   Chronic abdominal pain    H/O small bowel obstruction    Crohn's disease of intestine, with intestinal obstruction ([place])    History of DVT (deep vein thrombosis) 01/10/2007    Past Surgical History:  Procedure Laterality Date   ANAL SPHINCTER PROSTHESIS PLACEMENT     APPENDECTOMY     CARDIAC CATHETERIZATION Left 06/14/1997   Procedure: CARDIAC CATHETERIZATION; Location: [facility]; Surgeon: Reynolds Bowl, MD   CARPAL TUNNEL RELEASE Right 04/22/2014   CARPAL TUNNEL RELEASE Left    CATARACT EXTRACTION Bilateral    CERVICAL FUSION  2005   C6-7, anterior approach   CHOLECYSTECTOMY     COLONOSCOPY     2005, 2007, 2010, 2015, 2020, 2021   COLONOSCOPY WITH PROPOFOL N/A 05/10/2019   Procedure: COLONOSCOPY WITH PROPOFOL;  Surgeon: Manya Silvas, MD;  Location: [facility] ENDOSCOPY;  Service: Endoscopy;  Laterality: N/A;   ESOPHAGEAL MANOMETRY N/A 02/11/2018   Procedure: ESOPHAGEAL MANOMETRY (EM);  Surgeon: Lucilla Lame, MD;  Location: [facility] ENDOSCOPY;  Service: Endoscopy;  Laterality: N/A;   ESOPHAGOGASTRODUODENOSCOPY     1996, 2006, 2008, 2010, 2014, 2018, 2021   ESOPHAGOGASTRODUODENOSCOPY (EGD) WITH PROPOFOL N/A 12/17/2017   Procedure: ESOPHAGOGASTRODUODENOSCOPY (EGD) WITH PROPOFOL;  Surgeon: Robert Bellow, MD;  Location: [facility] ENDOSCOPY;  Service: Endoscopy;  Laterality: N/A;   ESOPHAGOGASTRODUODENOSCOPY (EGD) WITH PROPOFOL N/A 01/08/2018   Procedure: ESOPHAGOGASTRODUODENOSCOPY (EGD) WITH PROPOFOL;  Surgeon: Robert Bellow, MD;  Location: [facility] ENDOSCOPY;  Service: Endoscopy;  Laterality: N/A;   EXCISION NEUROMA Left 09/18/2021   Procedure: Excision of traumatic neuroma  infrapatellar branch saphenous nerve left knee.;  Surgeon: Corky Mull, MD;  Location: [facility] ORS;  Service: Orthopedics;  Laterality: Left;   GIVENS CAPSULE STUDY     2005, 2006   INSERTION OF VENA CAVA FILTER Right    KNEE ARTHROSCOPY WITH MEDIAL MENISECTOMY Left 06/22/2020   Procedure: Left knee arthroscopic partial medial meniscectomy;  Surgeon: Leim Fabry, MD;  Location: [place];  Service: Orthopedics;  Laterality: Left;   KNEE ARTHROSCOPY WITH MEDIAL MENISECTOMY Left 05/25/2021   Procedure: Left knee arthroscopy, infrapatellar fat pad excision;  Surgeon: Leim Fabry, MD;  Location: [facility] ORS;  Service: Orthopedics;  Laterality: Left;   LAPAROTOMY  01/30/2007   for bowel obstruction with LOA   LUMBAR LAMINECTOMY/DECOMPRESSION MICRODISCECTOMY N/A 12/25/2016   Procedure: LUMBAR DECOMPRESSION L4-5;  Surgeon: Meade Maw, MD;  Location: [facility] ORS;  Service: Neurosurgery;  Laterality: N/A;   NISSEN FUNDOPLICATION     ROTATOR CUFF  REPAIR Bilateral    SPLENECTOMY     secondary to colonoscopy   TONSILLECTOMY AND ADENOIDECTOMY     TRANSFORAMINAL LUMBAR INTERBODY FUSION (TLIF) WITH PEDICLE SCREW FIXATION 1 LEVEL N/A 04/01/2022   Procedure: OPEN L4-5 TRANSFORAMINAL LUMBAR INTERBODY FUSION (TLIF);  Surgeon: Meade Maw, MD;  Location: [facility] ORS;  Service: Neurosurgery;  Laterality: N/A;   TRIGGER FINGER RELEASE Left 08/25/2015   TRIGGER FINGER RELEASE Right 09/18/2015   VAGINAL HYSTERECTOMY      OB History   No obstetric history on file.      Home Medications    Prior to Admission medications   Medication Sig Start Date End Date Taking? Authorizing Provider  albuterol (VENTOLIN HFA) 108 (90 Base) MCG/ACT inhaler Inhale 2 puffs into the lungs every 4 (four) hours as needed. 01/04/23  Yes Margarette Canada, NP  benzonatate (TESSALON) 100 MG capsule Take 2 capsules (200 mg total) by mouth every 8 (eight) hours. 01/04/23  Yes Margarette Canada, NP  clonazePAM (KLONOPIN) 0.5 MG  tablet Take 1 tablet (0.5 mg total) by mouth daily as needed for anxiety. 09/12/22  Yes Crecencio Mc, MD  cyanocobalamin (VITAMIN B12) 1000 MCG/ML injection USE 1 ML INTO MUSCLE EVERY 30 DAY AS DIRECTED 12/12/22  Yes Crecencio Mc, MD  guaiFENesin-codeine (ROBITUSSIN AC) 100-10 MG/5ML syrup Take 5 mLs by mouth 3 (three) times daily as needed for cough. 01/04/23  Yes Margarette Canada, NP  levofloxacin (LEVAQUIN) 500 MG tablet Take 1 tablet (500 mg total) by mouth daily. 01/04/23  Yes Margarette Canada, NP  nortriptyline (PAMELOR) 25 MG capsule TAKE 1 CAPSULE BY MOUTH AT BEDTIME 12/10/22  Yes Crecencio Mc, MD  omeprazole (PRILOSEC) 40 MG capsule TAKE 1 CAPSULE BY MOUTH TWICE DAILY 11/08/22  Yes Crecencio Mc, MD  ondansetron (ZOFRAN-ODT) 4 MG disintegrating tablet Take 4 mg by mouth every 8 (eight) hours as needed. 10/22/22  Yes [provider]  predniSONE (DELTASONE) 20 MG tablet Take 3 tablets (60 mg total) by mouth daily with breakfast for 5 days. 3 tablets daily for 5 days. 01/04/23 01/09/23 Yes Margarette Canada, NP  rosuvastatin (CRESTOR) 10 MG tablet TAKE 1 TABLET BY MOUTH AT BEDTIME 10/23/22  Yes Crecencio Mc, MD  sertraline (ZOLOFT) 100 MG tablet TAKE 1 TABLET BY MOUTH ONCE DAILY 10/23/22  Yes Crecencio Mc, MD  Spacer/Aero-Holding Chambers (AEROCHAMBER MV) inhaler Use as instructed 01/04/23  Yes Margarette Canada, NP  traZODone (DESYREL) 100 MG tablet TAKE 1 TABLET BY MOUTH AT BEDTIME 09/30/22  Yes Crecencio Mc, MD    Family History Family History  Problem Relation Age of Onset   Diabetes Mother        type 2   Heart disease Mother    Hypertension Mother    Coronary artery disease Father    Heart attack Sister     Social History Social History   Tobacco Use   Smoking status: Every Day    Packs/day: 1.00    Years: 50.00    Total pack years: 50.00    Types: Cigarettes    Start date: 06/13/2022   Smokeless tobacco: Never  Vaping Use   Vaping Use: Never used  Substance Use  Topics   Alcohol use: No    Alcohol/week: 0.0 standard drinks of alcohol   Drug use: No     Allergies   Eliquis [apixaban], Meloxicam, and Sulfa antibiotics   Review of Systems Review of Systems  Constitutional:  Positive for fever.  HENT:  Positive for congestion, rhinorrhea and sore throat. Negative for ear pain.   Respiratory:  Positive for cough, shortness of breath and wheezing.   Neurological:  Positive for headaches.  Hematological: Negative.   Psychiatric/Behavioral: Negative.       Physical Exam Triage Vital Signs ED Triage Vitals  Enc Vitals Group     BP --      Pulse --      Resp --      Temp --      Temp src --      SpO2 --      Weight 01/04/23 0813 93 lb (42.2 kg)     Height --      Head Circumference --      Peak Flow --      Pain Score 01/04/23 0811 7     Pain Loc --      Pain Edu? --      Excl. in [place]? --    No data found.  Updated Vital Signs BP 116/71 (BP Location: Left Arm)   Pulse 92   Temp 97.9 F (36.6 C) (Oral)   Resp 18   Wt 93 lb (42.2 kg)   SpO2 94%   BMI 16.47 kg/m   Visual Acuity Right Eye Distance:   Left Eye Distance:   Bilateral Distance:    Right Eye Near:   Left Eye Near:    Bilateral Near:     Physical Exam Vitals and nursing note reviewed.  Constitutional:      Appearance: Normal appearance. She is not ill-appearing.  HENT:     Head: Normocephalic and atraumatic.     Right Ear: Tympanic membrane, ear canal and external ear normal. There is no impacted cerumen.     Left Ear: Tympanic membrane, ear canal and external ear normal. There is no impacted cerumen.     Nose: Congestion and rhinorrhea present.     Comments: Nasal mucosa is mildly edematous but no erythema and no discharge noted.    Mouth/Throat:     Mouth: Mucous membranes are moist.     Pharynx: Oropharynx is clear. Posterior oropharyngeal erythema present. No oropharyngeal exudate.     Comments: Mild erythema to the posterior oropharynx with mild  clear postnasal drip.  No injection. Cardiovascular:     Rate and Rhythm: Normal rate and regular rhythm.     Pulses: Normal pulses.     Heart sounds: Normal heart sounds. No murmur heard.    No friction rub. No gallop.  Pulmonary:     Effort: Pulmonary effort is normal.     Breath sounds: Rhonchi present. No wheezing or rales.  Musculoskeletal:     Cervical back: Normal range of motion and neck supple.  Lymphadenopathy:     Cervical: No cervical adenopathy.  Skin:    General: Skin is warm and dry.     Capillary Refill: Capillary refill takes less than 2 seconds.  Neurological:     General: No focal deficit present.     Mental Status: She is alert and oriented to person, place, and time.      UC Treatments / Results  Labs (all labs ordered are listed, but only abnormal results are displayed) Labs Reviewed  GROUP A STREP BY PCR    EKG   Radiology DG Chest 2 View  Result Date: 01/04/2023 CLINICAL DATA:  77 year old female with history of cough and shortness of breath. History of COPD. EXAM: CHEST - 2 VIEW COMPARISON:  Chest  x-ray 01/26/2020. FINDINGS: Lung volumes are hyperexpanded with emphysematous changes. No consolidative airspace disease. No pleural effusions. No pneumothorax. No pulmonary nodule or mass noted. Pulmonary vasculature and the cardiomediastinal silhouette are within normal limits. Atherosclerosis in the thoracic aorta. Orthopedic fixation hardware in the lower cervical spine and numerous surgical clips projecting over the upper abdomen incidentally noted. IMPRESSION: 1. No radiographic evidence of acute cardiopulmonary disease. 2. Emphysema. 3. Aortic atherosclerosis. Electronically Signed   By: Vinnie Langton M.D.   On: 01/04/2023 08:43    Procedures Procedures (including critical care time)  Medications Ordered in UC Medications - No data to display  Initial Impression / Assessment and Plan / UC Course  I have reviewed the triage vital signs and the  nursing notes.  Pertinent labs & imaging results that were available during my care of the patient were reviewed by me and considered in my medical decision making (see chart for details).   Patient is a pleasant and nontoxic-appearing 77 year old female with significant respiratory and cardiovascular history presenting for evaluation of 1 week of upper and lower respiratory symptoms as outlined in HPI above.  She has not any respiratory distress and she is able to speak in full sentences without dyspnea or tachypnea.  Her oxygen saturation on room air is 94% which is a mild decrease from her baseline.  Her respirations are 18.  Given the fact that she is complaining of a sore throat I will order a strep PCR though I think her cough is what is aggravating her throat as there is minimal erythema but no injection or exudate noted in the posterior oropharynx.  She has normal work of breathing but she does have scattered rhonchi.  With a history of pneumonia and COPD I will obtain a chest x-ray to look for any acute cardiopulmonary pathology.  The patient is still smoking but has not smoked in the last week due to the cough.  I suspect that she is having a COPD exacerbation.  Chest x-ray independently reviewed and evaluated by me.  Impression: There is streaky linear opacity in the right lung base as well as blunting of both costophrenic angles.  Questionable atelectasis versus developing infiltrate in the right lower lobe. Radiology impression states there is no radiographic evidence of acute cardiopulmonary disease.  Emphysema.  Aortic atherosclerosis.  Lung volumes are hyperexpanded with emphysematous changes.  Strep PCR is negative.  I will discharge patient home with a diagnosis of COPD exacerbation and cover for bacterial sources with Levaquin 500 milligrams once daily for 7 days.  I am also going to put her on a 5-day burst dose of 60 mg prednisone per day to help decrease pulmonary inflammation.  She  has no active prescription for an inhaler so I will prescribe an albuterol inhaler with a spacer and she can do 1 to 2 puffs every 4-6 hours as needed for shortness breath and wheezing.  Tessalon Perles as needed for cough during the day and Cheratussin cough syrup as needed at bedtime.  Return and ER precautions reviewed.   Final Clinical Impressions(s) / UC Diagnoses   Final diagnoses:  None     Discharge Instructions      Take the Levaquin 500 mg once daily for 7 days for treatment of your COPD exacerbation.  Use your albuterol inhaler, 1-2 puffs every 4-6 hours as needed for shortness breath or wheezing.  Take the prednisone 60 mg daily at breakfast time starting today.  Use the [employer] every 8  hours as needed for cough during the day.  They will sometimes cause numbness to the base of your tongue or give you metallic taste in her mouth.  This is normal.  You will need to take them with a small sip of water.  Use the Cheratussin at bedtime as needed for cough and congestion.  Be mindful of this medication will make you drowsy so have to get up in the middle of night to use the bathroom make sure you have your bearings about you before you stand up and try to walk.  If you develop any new symptoms please return for reevaluation or see your primary care provider.  If you develop any worsening shortness of breath, fevers, or chest pain please go to the ER for evaluation.     ED Prescriptions     Medication Sig Dispense Auth. Provider   levofloxacin (LEVAQUIN) 500 MG tablet Take 1 tablet (500 mg total) by mouth daily. 7 tablet Margarette Canada, NP   predniSONE (DELTASONE) 20 MG tablet Take 3 tablets (60 mg total) by mouth daily with breakfast for 5 days. 3 tablets daily for 5 days. 15 tablet Margarette Canada, NP   albuterol (VENTOLIN HFA) 108 (90 Base) MCG/ACT inhaler Inhale 2 puffs into the lungs every 4 (four) hours as needed. 18 g Margarette Canada, NP   Spacer/Aero-Holding Chambers  (AEROCHAMBER MV) inhaler Use as instructed 1 each Margarette Canada, NP   benzonatate (TESSALON) 100 MG capsule Take 2 capsules (200 mg total) by mouth every 8 (eight) hours. 21 capsule Margarette Canada, NP   guaiFENesin-codeine (ROBITUSSIN AC) 100-10 MG/5ML syrup Take 5 mLs by mouth 3 (three) times daily as needed for cough. 120 mL Margarette Canada, NP      I have reviewed the PDMP during this encounter.   Margarette Canada, NP 01/04/23 [phone]

## 2023-01-07 ENCOUNTER — Encounter (HOSPITAL_COMMUNITY): Payer: Self-pay

## 2023-01-07 ENCOUNTER — Telehealth (HOSPITAL_COMMUNITY): Payer: Self-pay | Admitting: *Deleted

## 2023-01-07 MED ORDER — METOPROLOL TARTRATE 100 MG PO TABS: ORAL_TABLET | ORAL | 0 refills | Status: DC

## 2023-01-07 NOTE — Telephone Encounter (Signed)
Reaching out to patient to offer assistance regarding upcoming cardiac imaging study; pt verbalizes understanding of appt date/time, parking situation and where to check in, pre-test NPO status and medications ordered, and verified current allergies; name and call back number provided for further questions should they arise  Gordy Clement RN Navigator Cardiac [facility] [phone] office [phone] cell  Patient unsure of her HR. Last HR was 69. Patient to take '100mg'$  metoprolol tartrate two hours prior to her cardiac CT scan.

## 2023-01-07 NOTE — Telephone Encounter (Signed)
Attempted to call patient regarding upcoming cardiac CT appointment. °Left message on voicemail with name and callback number ° °Oluwaseyi Raffel RN Navigator Cardiac Imaging °[facility] °[phone] Office °[phone] Cell ° °

## 2023-01-09 ENCOUNTER — Ambulatory Visit
Admission: RE | Admit: 2023-01-09 | Discharge: 2023-01-09 | Disposition: A | Discharge status: Home / Self Care | Payer: Medicare Other | Source: Ambulatory Visit | Attending: Internal Medicine | Admitting: Internal Medicine

## 2023-01-09 DIAGNOSIS — I700 Atherosclerosis of aorta: Secondary | ICD-10-CM | POA: Diagnosis not present

## 2023-01-09 DIAGNOSIS — R0602 Shortness of breath: Secondary | ICD-10-CM | POA: Insufficient documentation

## 2023-01-09 DIAGNOSIS — R079 Chest pain, unspecified: Secondary | ICD-10-CM | POA: Insufficient documentation

## 2023-01-09 DIAGNOSIS — J439 Emphysema, unspecified: Secondary | ICD-10-CM | POA: Insufficient documentation

## 2023-01-09 MED ORDER — IOHEXOL 300 MG/ML  SOLN: 75.0000 mL | Freq: Once | INTRAMUSCULAR | Status: DC | PRN

## 2023-01-09 MED ORDER — IOHEXOL 350 MG/ML SOLN
75.0000 mL | Freq: Once | INTRAVENOUS | Status: AC | PRN
  Administered 2023-01-09: 75 mL via INTRAVENOUS

## 2023-01-09 MED ORDER — NITROGLYCERIN 0.4 MG SL SUBL
0.4000 mg | SUBLINGUAL_TABLET | Freq: Once | SUBLINGUAL | Status: AC
  Administered 2023-01-09: 0.4 mg via SUBLINGUAL

## 2023-01-09 NOTE — Progress Notes (Signed)
Patient tolerated procedure well. Ambulate w/o difficulty. Denies light headedness or being dizzy. Sitting in chair drinking water provided. Encouraged to drink extra water today and reasoning explained. Verbalized understanding. All questions answered. ABC intact. No further needs. Discharge from procedure area w/o issues.   °

## 2023-01-14 DIAGNOSIS — R0602 Shortness of breath: Secondary | ICD-10-CM | POA: Diagnosis not present

## 2023-01-14 DIAGNOSIS — R0789 Other chest pain: Secondary | ICD-10-CM | POA: Diagnosis not present

## 2023-01-24 ENCOUNTER — Other Ambulatory Visit: Payer: Self-pay

## 2023-01-24 DIAGNOSIS — Z981 Arthrodesis status: Secondary | ICD-10-CM

## 2023-01-28 ENCOUNTER — Encounter: Payer: Self-pay | Admitting: Neurosurgery

## 2023-01-28 ENCOUNTER — Ambulatory Visit
Admission: RE | Admit: 2023-01-28 | Discharge: 2023-01-28 | Disposition: A | Discharge status: Home / Self Care | Payer: Medicare Other | Attending: Neurosurgery | Admitting: Neurosurgery

## 2023-01-28 ENCOUNTER — Ambulatory Visit: Payer: Medicare Other | Admitting: Neurosurgery

## 2023-01-28 ENCOUNTER — Ambulatory Visit
Admission: RE | Admit: 2023-01-28 | Discharge: 2023-01-28 | Disposition: A | Discharge status: Home / Self Care | Payer: Medicare Other | Source: Ambulatory Visit | Attending: Neurosurgery | Admitting: Neurosurgery

## 2023-01-28 VITALS — BP 112/58 | Wt 93.0 lb

## 2023-01-28 DIAGNOSIS — Z981 Arthrodesis status: Secondary | ICD-10-CM | POA: Diagnosis not present

## 2023-01-28 DIAGNOSIS — M4126 Other idiopathic scoliosis, lumbar region: Secondary | ICD-10-CM | POA: Diagnosis not present

## 2023-01-28 DIAGNOSIS — M545 Low back pain, unspecified: Secondary | ICD-10-CM | POA: Diagnosis not present

## 2023-01-28 NOTE — Progress Notes (Signed)
   DOS: 04/01/22 (Open L4-5 TLIF)  HISTORY OF PRESENT ILLNESS: 01/28/2023 She is doing well.  She has occasional aches and pains when active.  She continues to smoke.  She has left knee pain and left shoulder pain.  06/27/2022 She was doing well until she started physical therapy.  She now having pain in her right buttock and down the back of the leg.  This been bothersome for her.  She is very frustrated.  She did start smoking again 2 to 4 weeks ago.  05/16/22 [name] is status post transforaminal lumbar interbody fusion.  In her postoperative period, she had a Baker's cyst that was drained.  This caused severe pain in her right knee.  She is seeing orthopedics tomorrow.  She still having some residual radiculopathy, but her pain is much better than it was prior to surgery.   PHYSICAL EXAMINATION:   Vitals:   01/28/23 1023  BP: (!) 112/58   General: Patient is well developed, well nourished, calm, collected, and in no apparent distress.  NEUROLOGICAL:  General: In no acute distress.  Awake, alert, oriented to person, place, and time. Pupils equal round and reactive to light.   Strength:  Side Iliopsoas Quads Hamstring PF DF EHL  R 5 5 5 5 5 5   L 5 5 5 5 5 5    Incision c/d/i   ROS (Neurologic): Negative except as noted above  IMAGING: No complications noted  ASSESSMENT/PLAN:  Tamara [name] is doing well after lumbar surgery.  She is not taking any pain medication.  She occasionally takes NSAIDs.  I have recommended that she discontinue smoking.  She has left shoulder and left knee pain.  I have recommended that she reach out to Dr. Roland Rack if she would like to have her shoulder evaluated.  She previously saw him for knee surgery.   I will see her back on an as-needed basis   Meade Maw MD, [facility] Department of Neurosurgery

## 2023-01-29 ENCOUNTER — Encounter: Payer: Self-pay | Admitting: Neurosurgery

## 2023-02-03 DIAGNOSIS — M7522 Bicipital tendinitis, left shoulder: Secondary | ICD-10-CM | POA: Diagnosis not present

## 2023-02-03 DIAGNOSIS — M25812 Other specified joint disorders, left shoulder: Secondary | ICD-10-CM | POA: Diagnosis not present

## 2023-02-03 DIAGNOSIS — Z9889 Other specified postprocedural states: Secondary | ICD-10-CM | POA: Diagnosis not present

## 2023-02-03 DIAGNOSIS — M7582 Other shoulder lesions, left shoulder: Secondary | ICD-10-CM | POA: Diagnosis not present

## 2023-02-03 DIAGNOSIS — M7532 Calcific tendinitis of left shoulder: Secondary | ICD-10-CM | POA: Diagnosis not present

## 2023-02-20 ENCOUNTER — Telehealth: Payer: Self-pay | Admitting: Internal Medicine

## 2023-02-20 ENCOUNTER — Other Ambulatory Visit: Payer: Self-pay | Admitting: Orthopedic Surgery

## 2023-02-20 DIAGNOSIS — M25812 Other specified joint disorders, left shoulder: Secondary | ICD-10-CM

## 2023-02-20 DIAGNOSIS — M7582 Other shoulder lesions, left shoulder: Secondary | ICD-10-CM

## 2023-02-20 DIAGNOSIS — M7522 Bicipital tendinitis, left shoulder: Secondary | ICD-10-CM

## 2023-02-20 NOTE — Telephone Encounter (Signed)
Contacted [name] to schedule their annual wellness visit. Call back at later date: 02/26/2023  Patient has several doctor appointments and tests.  Thank you,  [facility] Support [facility] Direct dial  [phone]

## 2023-02-23 ENCOUNTER — Ambulatory Visit
Admission: RE | Admit: 2023-02-23 | Discharge: 2023-02-23 | Disposition: A | Discharge status: Home / Self Care | Payer: Medicare Other | Source: Ambulatory Visit | Attending: Orthopedic Surgery | Admitting: Orthopedic Surgery

## 2023-02-23 DIAGNOSIS — M7582 Other shoulder lesions, left shoulder: Secondary | ICD-10-CM | POA: Diagnosis not present

## 2023-02-23 DIAGNOSIS — M19012 Primary osteoarthritis, left shoulder: Secondary | ICD-10-CM | POA: Diagnosis not present

## 2023-02-23 DIAGNOSIS — M7522 Bicipital tendinitis, left shoulder: Secondary | ICD-10-CM | POA: Insufficient documentation

## 2023-02-23 DIAGNOSIS — M25812 Other specified joint disorders, left shoulder: Secondary | ICD-10-CM | POA: Diagnosis not present

## 2023-02-24 IMAGING — MR MR KNEE*L* W/O CM
6 series · 40 of 40 positions shown · non-contrast
Comparison: MR knee [DATE].  MR knee 05/12/2021.

CLINICAL DATA: Patient reports on [REDACTED] she had left knee
surgery. No known injury.

EXAM:
MRI OF THE LEFT KNEE WITHOUT CONTRAST
TECHNIQUE: Multiplanar, multisequence MR imaging of the knee was performed. No
intravenous contrast was administered.

[Series 3: T2 fat-sat · axial · 4.0mm · 0.50mm/px · z∈[-97,+73]mm · 9 of 35 slices shown (1 of 3)]
[im 1/35]
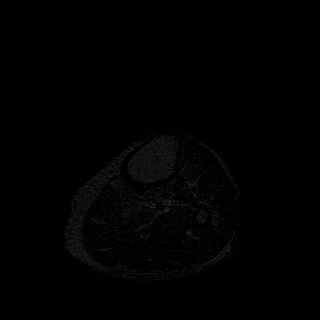
[im 5/35]
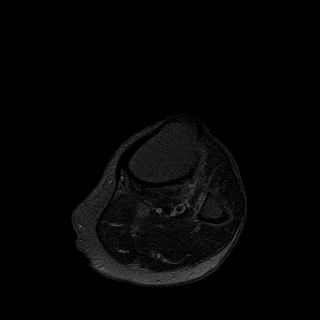
[im 9/35]
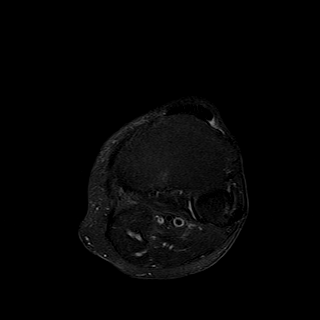
[im 13/35]
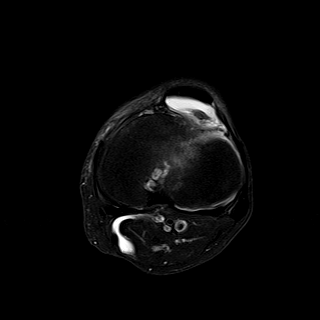
[im 18/35]
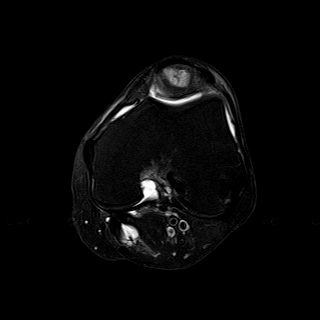
[im 22/35]
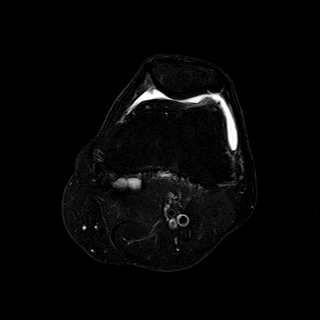
[im 26/35]
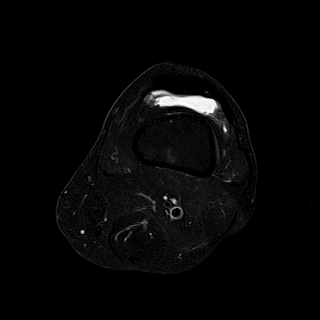
[im 30/35]
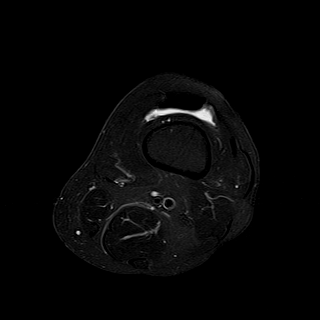
[im 35/35]
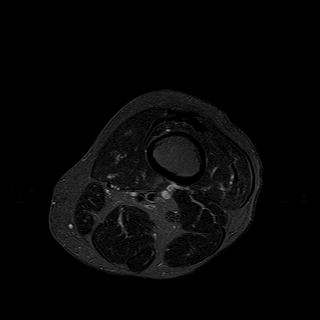

[Series 4: T1 · coronal · 4.0mm · 0.59mm/px · 7 of 26 slices shown]
[im 1/26]
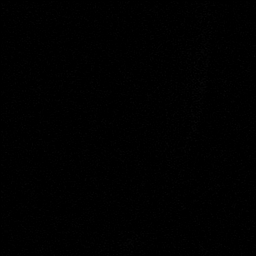
[im 5/26]
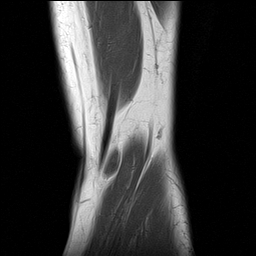
[im 9/26]
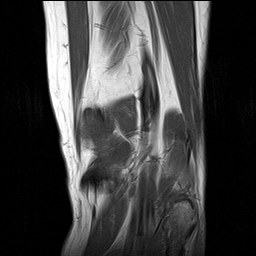
[im 13/26]
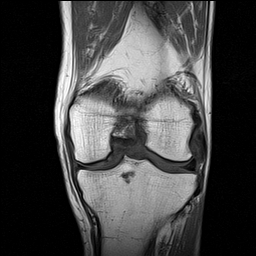
[im 17/26]
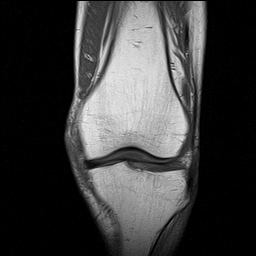
[im 21/26]
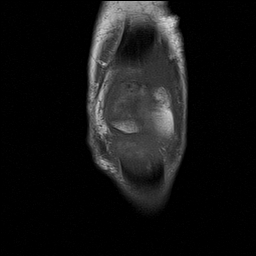
[im 26/26]
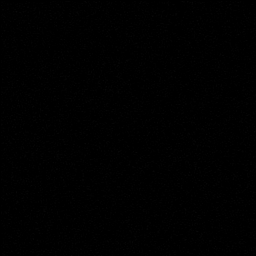

[Series 5: T2 fat-sat · coronal · 4.0mm · 0.59mm/px · 6 of 26 slices shown (2 of 3)]
[im 1/26]
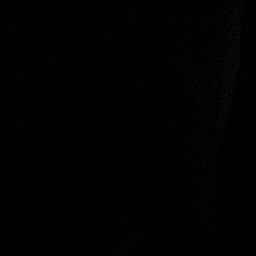
[im 6/26]
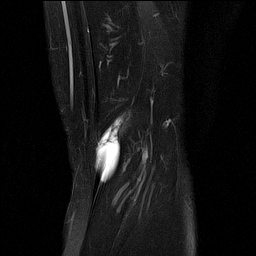
[im 11/26]
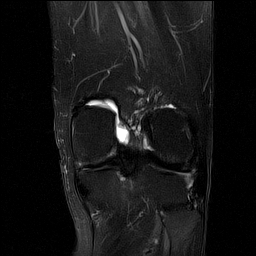
[im 16/26]
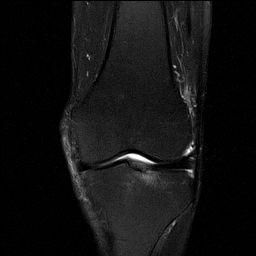
[im 21/26]
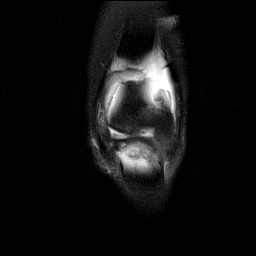
[im 26/26]
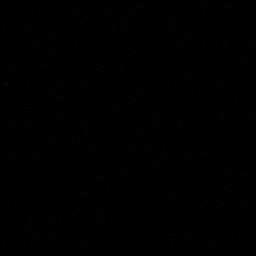

[Series 6: PD fat-sat · sagittal · 3.0mm · 0.59mm/px · 6 of 27 slices shown (1 of 2)]
[im 1/27]
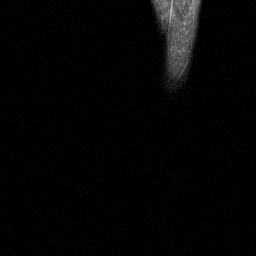
[im 6/27]
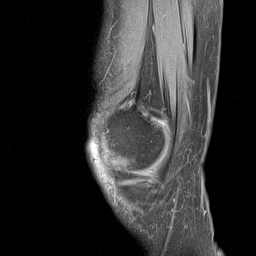
[im 11/27]
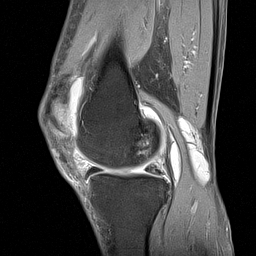
[im 16/27]
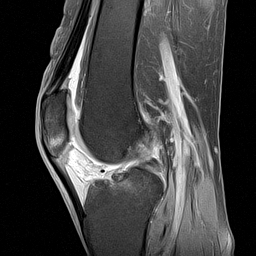
[im 21/27]
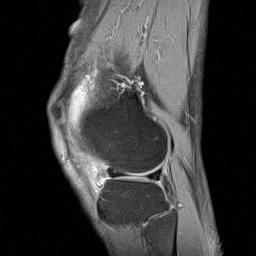
[im 27/27]
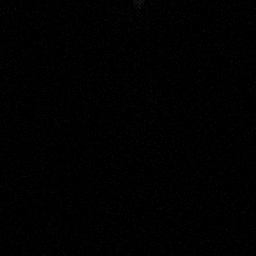

[Series 7: PD fat-sat · coronal · 4.0mm · 0.59mm/px · 6 of 26 slices shown (2 of 2)]
[im 1/26]
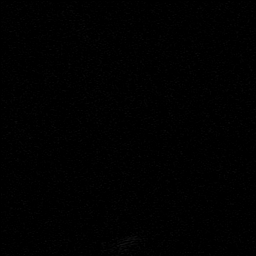
[im 6/26]
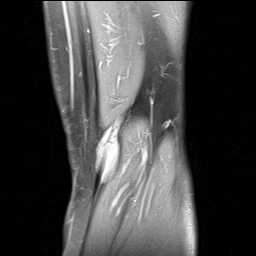
[im 11/26]
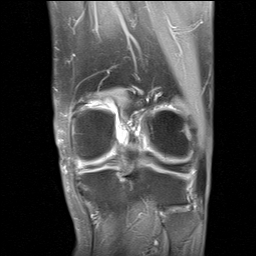
[im 16/26]
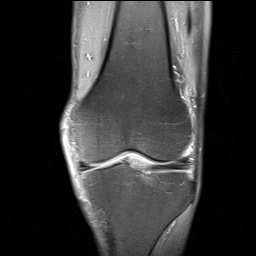
[im 21/26]
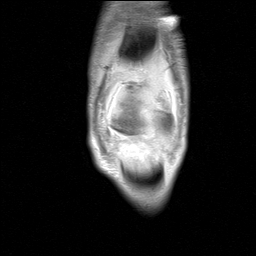
[im 26/26]
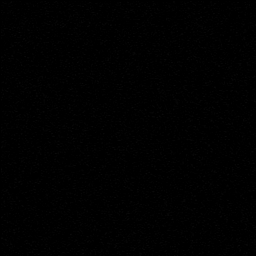

[Series 8: T2 fat-sat · sagittal · 3.0mm · 0.59mm/px · 6 of 27 slices shown (3 of 3)]
[im 1/27]
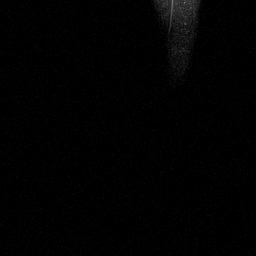
[im 6/27]
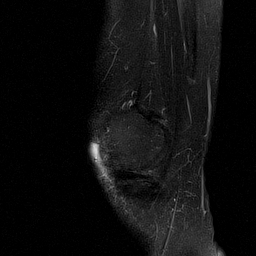
[im 11/27]
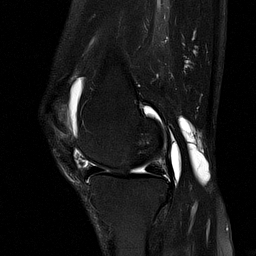
[im 16/27]
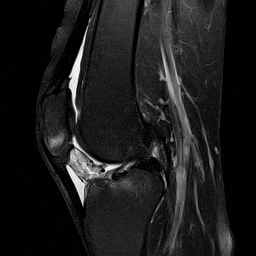
[im 21/27]
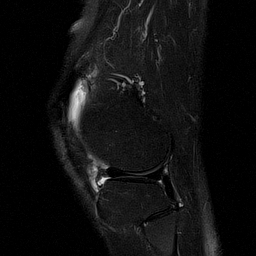
[im 27/27]
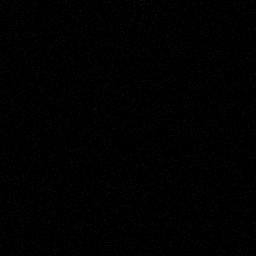

[40 of 40 positions shown; findings below may reference images not displayed]

FINDINGS: MENISCI

Medial meniscus: Unchanged appearance of the medial meniscus with
diminutive posterior horn and body compatible with partial
meniscectomy changes. No evidence to suggest new or recurrent tear.

Lateral meniscus:  Intrasubstance degeneration without tear.

LIGAMENTS

Cruciates:  Intact ACL and PCL.

Collaterals: Medial collateral ligament is intact. Lateral
collateral ligament complex is intact.

CARTILAGE

Patellofemoral: Near full-thickness chondral fissure along the
peripheral aspect of the lateral patellar facet, new from prior
(series 3, image 14). Partial thickness chondral loss along the
lateral patellar facet and patellar apex is similar to prior.

Medial: Similar degree of chondral thinning of the weight-bearing
medial compartment.

Lateral: Similar degree of chondral thinning of the weight-bearing
lateral compartment.

Joint: Interval postsurgical changes within Hoffa's fat related to
debridement with synovectomy. Residual fat tissue within Hoffa's fat
appears edematous with probable scarring. There is a moderate-sized
joint effusion.

Popliteal Fossa: Moderate-sized Baker's cyst. Intact popliteus
tendon.

Extensor Mechanism: Small focal defect within the medial patellar
retinaculum inferiorly (series 3, image 20) with a small amount of
fluid extending into the anteromedial soft tissues (series 5, image
19). Intact quadriceps tendon and patellar tendon.

Bones: Focal bone marrow edema within the inferior pole of the
patella with a linear low signal intensity component suspicious for
a nondisplaced fracture (series 8, image 15). Fracture does not
appear to extend to the articular surface. Small subchondral cysts
or intraosseous ganglia within the proximal tibia. No suspicious
bone lesion.

Other: None.
IMPRESSION: 1. Focal bone marrow edema within the inferior pole of the patella
with a linear low signal intensity component suspicious for a
nondisplaced extra-articular fracture.
2. Interval postsurgical changes within Hoffa's fat related to
debridement and synovectomy. Residual tissue within Hoffa's fat
appears edematous with probable scarring.
3. Small focal defect within the medial patellar retinaculum
inferiorly with a small amount of joint fluid extending into the
anteromedial soft tissues.
4. Moderate-sized joint effusion and moderate-sized Baker's cyst.
5. Tricompartmental chondral abnormalities, greatest in the
patellofemoral compartment where there is a new near full-thickness
chondral fissure along the peripheral aspect of the lateral patellar
facet.
6. Unchanged appearance of the medial meniscus with diminutive
posterior horn and body compatible with partial meniscectomy
changes. No evidence to suggest new or recurrent tear.

## 2023-02-27 ENCOUNTER — Telehealth: Payer: Self-pay | Admitting: Internal Medicine

## 2023-02-27 NOTE — Telephone Encounter (Signed)
Called patient to schedule Medicare Annual Wellness Visit (AWV). Left message for patient to call back and schedule Medicare Annual Wellness Visit (AWV).  Last date of AWV: 02/21/2022   Please schedule an AWVS appointment at any time with [facility] VISIT.  If any questions, please contact me at [phone].    Thank you,  [facility] Direct dial  [phone]

## 2023-03-03 ENCOUNTER — Other Ambulatory Visit: Payer: Self-pay | Admitting: Surgery

## 2023-03-03 DIAGNOSIS — S46102A Unspecified injury of muscle, fascia and tendon of long head of biceps, left arm, initial encounter: Secondary | ICD-10-CM | POA: Diagnosis not present

## 2023-03-03 DIAGNOSIS — M7582 Other shoulder lesions, left shoulder: Secondary | ICD-10-CM | POA: Diagnosis not present

## 2023-03-03 DIAGNOSIS — M75122 Complete rotator cuff tear or rupture of left shoulder, not specified as traumatic: Secondary | ICD-10-CM | POA: Diagnosis not present

## 2023-03-03 DIAGNOSIS — M25312 Other instability, left shoulder: Secondary | ICD-10-CM | POA: Diagnosis not present

## 2023-03-03 DIAGNOSIS — S40012A Contusion of left shoulder, initial encounter: Secondary | ICD-10-CM | POA: Diagnosis not present

## 2023-03-03 MED ORDER — CHLORHEXIDINE GLUCONATE 0.12 % MT SOLN
15.0000 mL | Freq: Once | OROMUCOSAL | Status: AC
  Administered 2023-03-04: 15 mL via OROMUCOSAL

## 2023-03-03 MED ORDER — ORAL CARE MOUTH RINSE: 15.0000 mL | Freq: Once | OROMUCOSAL | Status: AC

## 2023-03-03 MED ORDER — CEFAZOLIN SODIUM-DEXTROSE 2-4 GM/100ML-% IV SOLN
2.0000 g | INTRAVENOUS | Status: AC
  Administered 2023-03-04: 2 g via INTRAVENOUS

## 2023-03-03 MED ORDER — LACTATED RINGERS IV SOLN: INTRAVENOUS | Status: DC

## 2023-03-04 ENCOUNTER — Encounter: Payer: Self-pay | Admitting: Surgery

## 2023-03-04 ENCOUNTER — Other Ambulatory Visit: Payer: Self-pay

## 2023-03-04 ENCOUNTER — Ambulatory Visit: Payer: Medicare Other

## 2023-03-04 ENCOUNTER — Ambulatory Visit: Payer: Medicare Other | Admitting: Anesthesiology

## 2023-03-04 ENCOUNTER — Encounter
Admission: RE | Disposition: A | Discharge status: Home / Self Care | Payer: Self-pay | Source: Home / Self Care | Attending: Surgery

## 2023-03-04 ENCOUNTER — Ambulatory Visit
Admission: RE | Admit: 2023-03-04 | Discharge: 2023-03-04 | Disposition: A | Discharge status: Home / Self Care | Payer: Medicare Other | Attending: Surgery | Admitting: Surgery

## 2023-03-04 DIAGNOSIS — J449 Chronic obstructive pulmonary disease, unspecified: Secondary | ICD-10-CM | POA: Diagnosis not present

## 2023-03-04 DIAGNOSIS — M25812 Other specified joint disorders, left shoulder: Secondary | ICD-10-CM | POA: Diagnosis not present

## 2023-03-04 DIAGNOSIS — I2510 Atherosclerotic heart disease of native coronary artery without angina pectoris: Secondary | ICD-10-CM | POA: Insufficient documentation

## 2023-03-04 DIAGNOSIS — Z0000 Encounter for general adult medical examination without abnormal findings: Secondary | ICD-10-CM

## 2023-03-04 DIAGNOSIS — G4733 Obstructive sleep apnea (adult) (pediatric): Secondary | ICD-10-CM | POA: Insufficient documentation

## 2023-03-04 DIAGNOSIS — X58XXXA Exposure to other specified factors, initial encounter: Secondary | ICD-10-CM | POA: Diagnosis not present

## 2023-03-04 DIAGNOSIS — M24112 Other articular cartilage disorders, left shoulder: Secondary | ICD-10-CM | POA: Diagnosis not present

## 2023-03-04 DIAGNOSIS — K219 Gastro-esophageal reflux disease without esophagitis: Secondary | ICD-10-CM | POA: Diagnosis not present

## 2023-03-04 DIAGNOSIS — F418 Other specified anxiety disorders: Secondary | ICD-10-CM | POA: Insufficient documentation

## 2023-03-04 DIAGNOSIS — M19012 Primary osteoarthritis, left shoulder: Secondary | ICD-10-CM | POA: Insufficient documentation

## 2023-03-04 DIAGNOSIS — M75122 Complete rotator cuff tear or rupture of left shoulder, not specified as traumatic: Secondary | ICD-10-CM | POA: Insufficient documentation

## 2023-03-04 DIAGNOSIS — K449 Diaphragmatic hernia without obstruction or gangrene: Secondary | ICD-10-CM | POA: Diagnosis not present

## 2023-03-04 DIAGNOSIS — G8918 Other acute postprocedural pain: Secondary | ICD-10-CM | POA: Diagnosis not present

## 2023-03-04 DIAGNOSIS — F17210 Nicotine dependence, cigarettes, uncomplicated: Secondary | ICD-10-CM | POA: Diagnosis not present

## 2023-03-04 DIAGNOSIS — M7582 Other shoulder lesions, left shoulder: Secondary | ICD-10-CM | POA: Diagnosis not present

## 2023-03-04 DIAGNOSIS — M25312 Other instability, left shoulder: Secondary | ICD-10-CM | POA: Diagnosis not present

## 2023-03-04 DIAGNOSIS — G709 Myoneural disorder, unspecified: Secondary | ICD-10-CM | POA: Insufficient documentation

## 2023-03-04 DIAGNOSIS — S40012A Contusion of left shoulder, initial encounter: Secondary | ICD-10-CM | POA: Diagnosis not present

## 2023-03-04 HISTORY — PX: SHOULDER ARTHROSCOPY WITH SUBACROMIAL DECOMPRESSION, ROTATOR CUFF REPAIR AND BICEP TENDON REPAIR: SHX5687

## 2023-03-04 SURGERY — SHOULDER ARTHROSCOPY WITH SUBACROMIAL DECOMPRESSION, ROTATOR CUFF REPAIR AND BICEP TENDON REPAIR
Anesthesia: General | Site: Shoulder | Laterality: Left

## 2023-03-04 MED ORDER — ACETAMINOPHEN 325 MG PO TABS: 325.0000 mg | ORAL_TABLET | Freq: Four times a day (QID) | ORAL | Status: DC | PRN

## 2023-03-04 MED ORDER — LIDOCAINE HCL (PF) 1 % IJ SOLN
INTRAMUSCULAR | Status: DC | PRN
  Administered 2023-03-04: 3 mL

## 2023-03-04 MED ORDER — CHLORHEXIDINE GLUCONATE 0.12 % MT SOLN
OROMUCOSAL | Status: AC
  Filled 2023-03-04: qty 15

## 2023-03-04 MED ORDER — FENTANYL CITRATE PF 50 MCG/ML IJ SOSY
50.0000 ug | PREFILLED_SYRINGE | Freq: Once | INTRAMUSCULAR | Status: AC
  Administered 2023-03-04: 50 ug via INTRAVENOUS

## 2023-03-04 MED ORDER — PROPOFOL 10 MG/ML IV BOLUS
INTRAVENOUS | Status: DC | PRN
  Administered 2023-03-04: 100 mg via INTRAVENOUS

## 2023-03-04 MED ORDER — BUPIVACAINE-EPINEPHRINE 0.5% -1:200000 IJ SOLN
INTRAMUSCULAR | Status: DC | PRN
  Administered 2023-03-04: 30 mL

## 2023-03-04 MED ORDER — FENTANYL CITRATE (PF) 100 MCG/2ML IJ SOLN: 25.0000 ug | INTRAMUSCULAR | Status: DC | PRN

## 2023-03-04 MED ORDER — SUGAMMADEX SODIUM 200 MG/2ML IV SOLN
INTRAVENOUS | Status: DC | PRN
  Administered 2023-03-04: 120 mg via INTRAVENOUS

## 2023-03-04 MED ORDER — BUPIVACAINE LIPOSOME 1.3 % IJ SUSP
INTRAMUSCULAR | Status: AC
  Filled 2023-03-04: qty 10

## 2023-03-04 MED ORDER — EPHEDRINE SULFATE (PRESSORS) 50 MG/ML IJ SOLN
INTRAMUSCULAR | Status: DC | PRN
  Administered 2023-03-04: 5 mg via INTRAVENOUS
  Administered 2023-03-04: 10 mg via INTRAVENOUS

## 2023-03-04 MED ORDER — BUPIVACAINE HCL (PF) 0.5 % IJ SOLN
INTRAMUSCULAR | Status: AC
  Filled 2023-03-04: qty 30

## 2023-03-04 MED ORDER — ACETAMINOPHEN 10 MG/ML IV SOLN
INTRAVENOUS | Status: AC
  Filled 2023-03-04: qty 100

## 2023-03-04 MED ORDER — METOCLOPRAMIDE HCL 10 MG PO TABS: 5.0000 mg | ORAL_TABLET | Freq: Three times a day (TID) | ORAL | Status: DC | PRN

## 2023-03-04 MED ORDER — DEXAMETHASONE SODIUM PHOSPHATE 10 MG/ML IJ SOLN
INTRAMUSCULAR | Status: DC | PRN
  Administered 2023-03-04: 4 mg via INTRAVENOUS

## 2023-03-04 MED ORDER — FENTANYL CITRATE (PF) 100 MCG/2ML IJ SOLN
INTRAMUSCULAR | Status: DC | PRN
  Administered 2023-03-04: 25 ug via INTRAVENOUS
  Administered 2023-03-04: 50 ug via INTRAVENOUS
  Administered 2023-03-04: 25 ug via INTRAVENOUS

## 2023-03-04 MED ORDER — OXYCODONE HCL 5 MG PO TABS: 5.0000 mg | ORAL_TABLET | Freq: Once | ORAL | Status: DC | PRN

## 2023-03-04 MED ORDER — CEFAZOLIN SODIUM-DEXTROSE 2-4 GM/100ML-% IV SOLN
INTRAVENOUS | Status: AC
  Filled 2023-03-04: qty 100

## 2023-03-04 MED ORDER — METOCLOPRAMIDE HCL 5 MG/ML IJ SOLN: 5.0000 mg | Freq: Three times a day (TID) | INTRAMUSCULAR | Status: DC | PRN

## 2023-03-04 MED ORDER — 0.9 % SODIUM CHLORIDE (POUR BTL) OPTIME
TOPICAL | Status: DC | PRN
  Administered 2023-03-04: 500 mL

## 2023-03-04 MED ORDER — SODIUM CHLORIDE 0.9 % IV SOLN: INTRAVENOUS | Status: DC

## 2023-03-04 MED ORDER — OXYCODONE HCL 5 MG/5ML PO SOLN: 5.0000 mg | Freq: Once | ORAL | Status: DC | PRN

## 2023-03-04 MED ORDER — BUPIVACAINE HCL (PF) 0.5 % IJ SOLN
INTRAMUSCULAR | Status: DC | PRN
  Administered 2023-03-04: 10 mL

## 2023-03-04 MED ORDER — PHENYLEPHRINE HCL-NACL 20-0.9 MG/250ML-% IV SOLN
INTRAVENOUS | Status: DC | PRN
  Administered 2023-03-04: 50 ug/min via INTRAVENOUS

## 2023-03-04 MED ORDER — HYDROCODONE-ACETAMINOPHEN 5-325 MG PO TABS: 1.0000 | ORAL_TABLET | ORAL | Status: DC | PRN

## 2023-03-04 MED ORDER — EPINEPHRINE PF 1 MG/ML IJ SOLN
INTRAMUSCULAR | Status: AC
  Filled 2023-03-04: qty 2

## 2023-03-04 MED ORDER — BUPIVACAINE HCL (PF) 0.5 % IJ SOLN
INTRAMUSCULAR | Status: AC
  Filled 2023-03-04: qty 10

## 2023-03-04 MED ORDER — LIDOCAINE HCL (PF) 1 % IJ SOLN
INTRAMUSCULAR | Status: AC
  Filled 2023-03-04: qty 5

## 2023-03-04 MED ORDER — ONDANSETRON HCL 4 MG/2ML IJ SOLN
INTRAMUSCULAR | Status: DC | PRN
  Administered 2023-03-04: 4 mg via INTRAVENOUS

## 2023-03-04 MED ORDER — LIDOCAINE HCL (CARDIAC) PF 100 MG/5ML IV SOSY
PREFILLED_SYRINGE | INTRAVENOUS | Status: DC | PRN
  Administered 2023-03-04: 50 mg via INTRAVENOUS

## 2023-03-04 MED ORDER — KETOROLAC TROMETHAMINE 15 MG/ML IJ SOLN: 15.0000 mg | Freq: Once | INTRAMUSCULAR | Status: DC

## 2023-03-04 MED ORDER — FENTANYL CITRATE PF 50 MCG/ML IJ SOSY
PREFILLED_SYRINGE | INTRAMUSCULAR | Status: AC
  Filled 2023-03-04: qty 1

## 2023-03-04 MED ORDER — BUPIVACAINE LIPOSOME 1.3 % IJ SUSP
INTRAMUSCULAR | Status: DC | PRN
  Administered 2023-03-04: 10 mL

## 2023-03-04 MED ORDER — ROCURONIUM BROMIDE 100 MG/10ML IV SOLN
INTRAVENOUS | Status: DC | PRN
  Administered 2023-03-04: 45 mg via INTRAVENOUS

## 2023-03-04 MED ORDER — ONDANSETRON HCL 4 MG PO TABS: 4.0000 mg | ORAL_TABLET | Freq: Four times a day (QID) | ORAL | Status: DC | PRN

## 2023-03-04 MED ORDER — LACTATED RINGERS IV SOLN
INTRAVENOUS | Status: DC | PRN
  Administered 2023-03-04: 3001 mL

## 2023-03-04 MED ORDER — ACETAMINOPHEN 10 MG/ML IV SOLN
INTRAVENOUS | Status: DC | PRN
  Administered 2023-03-04: 650 mg via INTRAVENOUS

## 2023-03-04 MED ORDER — ONDANSETRON HCL 4 MG/2ML IJ SOLN: 4.0000 mg | Freq: Four times a day (QID) | INTRAMUSCULAR | Status: DC | PRN

## 2023-03-04 MED ORDER — PHENYLEPHRINE HCL (PRESSORS) 10 MG/ML IV SOLN
INTRAVENOUS | Status: DC | PRN
  Administered 2023-03-04 (×2): 160 ug via INTRAVENOUS

## 2023-03-04 MED ORDER — FENTANYL CITRATE (PF) 100 MCG/2ML IJ SOLN
INTRAMUSCULAR | Status: AC
  Filled 2023-03-04: qty 2

## 2023-03-04 SURGICAL SUPPLY — 60 items
ANCH SUT 5.5 KNTLS (Anchor) ×1 IMPLANT
ANCH SUT BN ASCP DLV (Anchor) ×1 IMPLANT
ANCH SUT Q-FX 2.8 (Anchor) ×2 IMPLANT
ANCH SUT RGNRT REGENETEN (Staple) ×1 IMPLANT
ANCHOR ALL-SUT Q-FIX 2.8 (Anchor) IMPLANT
ANCHOR BONE REGENETEN (Anchor) IMPLANT
ANCHOR HEALICOIL REGEN 5.5 (Anchor) IMPLANT
ANCHOR TENDON REGENETEN (Staple) IMPLANT
APL PRP STRL LF DISP 70% ISPRP (MISCELLANEOUS) ×1
BIT DRILL JUGRKNT W/NDL BIT2.9 (DRILL) IMPLANT
BLADE FULL RADIUS 3.5 (BLADE) ×1 IMPLANT
BUR ACROMIONIZER 4.0 (BURR) ×1 IMPLANT
CANNULA SHAVER 8MMX76MM (CANNULA) ×1 IMPLANT
CHLORAPREP W/TINT 26 (MISCELLANEOUS) ×1 IMPLANT
COVER MAYO STAND REUSABLE (DRAPES) ×1 IMPLANT
DILATOR 5.5 THREADED HEALICOIL (MISCELLANEOUS) IMPLANT
DRILL JUGGERKNOT W/NDL BIT 2.9 (DRILL)
ELECT CAUTERY BLADE 6.4 (BLADE) ×1 IMPLANT
ELECT REM PT RETURN 9FT ADLT (ELECTROSURGICAL) ×1
ELECTRODE REM PT RTRN 9FT ADLT (ELECTROSURGICAL) ×1 IMPLANT
GAUZE SPONGE 4X4 12PLY STRL (GAUZE/BANDAGES/DRESSINGS) ×1 IMPLANT
GAUZE XEROFORM 1X8 LF (GAUZE/BANDAGES/DRESSINGS) ×1 IMPLANT
GLOVE BIO SURGEON STRL SZ7.5 (GLOVE) ×2 IMPLANT
GLOVE BIO SURGEON STRL SZ8 (GLOVE) ×2 IMPLANT
GLOVE BIOGEL PI IND STRL 8 (GLOVE) ×1 IMPLANT
GLOVE INDICATOR 8.0 STRL GRN (GLOVE) ×1 IMPLANT
GOWN STRL REUS W/ TWL LRG LVL3 (GOWN DISPOSABLE) ×1 IMPLANT
GOWN STRL REUS W/ TWL XL LVL3 (GOWN DISPOSABLE) ×1 IMPLANT
GOWN STRL REUS W/TWL LRG LVL3 (GOWN DISPOSABLE) ×1
GOWN STRL REUS W/TWL XL LVL3 (GOWN DISPOSABLE) ×1
GRASPER SUT 15 45D LOW PRO (SUTURE) IMPLANT
IMPL REGENETEN MEDIUM (Shoulder) IMPLANT
IMPLANT REGENETEN MEDIUM (Shoulder) ×1 IMPLANT
IV LACTATED RINGER IRRG 3000ML (IV SOLUTION) ×1
IV LR IRRIG 3000ML ARTHROMATIC (IV SOLUTION) ×2 IMPLANT
KIT CANNULA 8X76-LX IN CANNULA (CANNULA) ×1 IMPLANT
KIT SUTURE 2.8 Q-FIX DISP (MISCELLANEOUS) IMPLANT
MANIFOLD NEPTUNE II (INSTRUMENTS) ×2 IMPLANT
MASK FACE SPIDER DISP (MASK) ×1 IMPLANT
MAT ABSORB  FLUID 56X50 GRAY (MISCELLANEOUS) ×1
MAT ABSORB FLUID 56X50 GRAY (MISCELLANEOUS) ×1 IMPLANT
PACK ARTHROSCOPY SHOULDER (MISCELLANEOUS) ×1 IMPLANT
PAD ABD DERMACEA PRESS 5X9 (GAUZE/BANDAGES/DRESSINGS) ×2 IMPLANT
PASSER SUT FIRSTPASS SELF (INSTRUMENTS) IMPLANT
SLEEVE REMOTE CONTROL 5X12 (DRAPES) IMPLANT
SLING ARM LRG DEEP (SOFTGOODS) ×1 IMPLANT
SLING ULTRA II LG (MISCELLANEOUS) ×1 IMPLANT
SPONGE T-LAP 18X18 ~~LOC~~+RFID (SPONGE) ×1 IMPLANT
STAPLER SKIN PROX 35W (STAPLE) ×1 IMPLANT
STRAP SAFETY 5IN WIDE (MISCELLANEOUS) ×1 IMPLANT
SUT ETHIBOND 0 MO6 C/R (SUTURE) ×1 IMPLANT
SUT ULTRABRAID 2 COBRAID 38 (SUTURE) IMPLANT
SUT VIC AB 2-0 CT1 27 (SUTURE) ×2
SUT VIC AB 2-0 CT1 TAPERPNT 27 (SUTURE) ×2 IMPLANT
TAPE MICROFOAM 4IN (TAPE) ×1 IMPLANT
TRAP FLUID SMOKE EVACUATOR (MISCELLANEOUS) ×1 IMPLANT
TUBE SET DOUBLEFLO INFLOW (TUBING) ×1 IMPLANT
TUBING CONNECTING 10 (TUBING) ×1 IMPLANT
WAND WEREWOLF FLOW 90D (MISCELLANEOUS) ×1 IMPLANT
WATER STERILE IRR 500ML POUR (IV SOLUTION) ×1 IMPLANT

## 2023-03-04 NOTE — Anesthesia Procedure Notes (Signed)
Procedure Name: Intubation Date/Time: 03/04/2023 2:49 PM  Performed by: Henrietta Hoover, CRNAPre-anesthesia Checklist: Patient identified, Emergency Drugs available, Suction available and Patient being monitored Patient Re-evaluated:Patient Re-evaluated prior to induction Oxygen Delivery Method: Circle system utilized Preoxygenation: Pre-oxygenation with 100% oxygen Induction Type: IV induction Ventilation: Mask ventilation without difficulty Laryngoscope Size: 3 and McGraph Grade View: Grade I Tube type: Oral Tube size: 6.5 mm Number of attempts: 1 Airway Equipment and Method: Stylet and Video-laryngoscopy Placement Confirmation: ETT inserted through vocal cords under direct vision, positive ETCO2 and breath sounds checked- equal and bilateral Secured at: 20 cm Tube secured with: Tape Dental Injury: Teeth and Oropharynx as per pre-operative assessment

## 2023-03-04 NOTE — Transfer of Care (Signed)
Immediate Anesthesia Transfer of Care Note  Patient: Tamara [name]  Procedure(s) Performed: LEFT SHOULDER ARTHROSCOPY WITH DEBRIDEMENT, DECOMPRESSION, ROATOR CUFF REPAIR, AND POSSIBLE BICEPS TENODESIS. - RNFA (Left: Shoulder)  Patient Location: PACU  Anesthesia Type:GA combined with regional for post-op pain  Level of Consciousness: awake, drowsy, and patient cooperative  Airway & Oxygen Therapy: Patient Spontanous Breathing and Patient connected to face mask  Post-op Assessment: Report given to RN and Post -op Vital signs reviewed and stable  Post vital signs: Reviewed and stable  Last Vitals:  Vitals Value Taken Time  BP 145/62 03/04/23 1630  Temp 36.3 C 03/04/23 1630  Pulse 100 03/04/23 1633  Resp 19 03/04/23 1633  SpO2 100 % 03/04/23 1633  Vitals shown include unvalidated device data.  Last Pain:  Vitals:   03/04/23 1254  TempSrc: Oral  PainSc: 8          Complications: No notable events documented.

## 2023-03-04 NOTE — Discharge Instructions (Addendum)
Orthopedic discharge instructions: Keep dressing dry and intact.  May shower after dressing changed on post-op day #4 (Saturday).  Cover staples with Band-Aids after drying off. Apply ice frequently to shoulder. Take ibuprofen 600 mg TID with meals for 3-5 days, then as necessary. Take hydrocodone as prescribed or extra strength Tylenol when needed.  Keep shoulder immobilizer on at all times except may remove for bathing purposes. Follow-up in 10-14 days or as scheduled.  AMBULATORY SURGERY  DISCHARGE INSTRUCTIONS   The drugs that you were given will stay in your system until tomorrow so for the next 24 hours you should not:  Drive an automobile Make any legal decisions Drink any alcoholic beverage   You may resume regular meals tomorrow.  Today it is better to start with liquids and gradually work up to solid foods.  You may eat anything you prefer, but it is better to start with liquids, then soup and crackers, and gradually work up to solid foods.   Please notify your doctor immediately if you have any unusual bleeding, trouble breathing, redness and pain at the surgery site, drainage, fever, or pain not relieved by medication.    Additional Instructions: PLEASE LEAVE TEAL (EXPAREL) ARMBAND ON FOR 4 DAYS    Please contact your physician with any problems or Same Day Surgery at [phone], Monday through Friday 6 am to 4 pm, or [place] at [facility] number at [phone].     Interscalene Nerve Block with Exparel   For your surgery you have received an Interscalene Nerve Block with Exparel. Nerve Blocks affect many types of nerves, including nerves that control movement, pain and normal sensation.  You may experience feelings such as numbness, tingling, heaviness, weakness or the inability to move your arm or the feeling or sensation that your arm has "fallen asleep". A nerve block with Exparel can last up to 5 days.  Usually the weakness wears off first.  The  tingling and heaviness usually wear off next.  Finally you may start to notice pain.  Keep in mind that this may occur in any order.  Once a nerve block starts to wear off it is usually completely gone within 60 minutes. ISNB may cause mild shortness of breath, a hoarse voice, blurry vision, unequal pupils, or drooping of the face on the same side as the nerve block.  These symptoms will usually resolve with the numbness.  Very rarely the procedure itself can cause mild seizures. If needed, your surgeon will give you a prescription for pain medication.  It will take about 60 minutes for the oral pain medication to become fully effective.  So, it is recommended that you start taking this medication before the nerve block first begins to wear off, or when you first begin to feel discomfort. Take your pain medication only as prescribed.  Pain medication can cause sedation and decrease your breathing if you take more than you need for the level of pain that you have. Nausea is a common side effect of many pain medications.  You may want to eat something before taking your pain medicine to prevent nausea. After an Interscalene nerve block, you cannot feel pain, pressure or extremes in temperature in the effected arm.  Because your arm is numb it is at an increased risk for injury.  To decrease the possibility of injury, please practice the following:  While you are awake change the position of your arm frequently to prevent too much pressure on any one area  for prolonged periods of time.  If you have a cast or tight dressing, check the color or your fingers every couple of hours.  Call your surgeon with the appearance of any discoloration (white or blue). If you are given a sling to wear before you go home, please wear it  at all times until the block has completely worn off.  Do not get up at night without your sling. Please contact [facility] or your surgeon if you do not begin to regain sensation after  7 days from the surgery.  Anesthesia may be contacted by calling the Same Day Surgery Department, Mon. through Fri., 6 am to 4 pm at [phone].   If you experience any other problems or concerns, please contact your surgeon's office. If you experience severe or prolonged shortness of breath go to the nearest emergency department. SHOULDER SLING IMMOBILIZER   VIDEO Slingshot 2 Shoulder Brace Application - YouTube ---[email or web address].info/  INSTRUCTIONS While supporting the injured arm, slide the forearm into the sling. Wrap the adjustable shoulder strap around the neck and shoulders and attach the strap end to the sling using  the "alligator strap tab."  Adjust the shoulder strap to the required length. Position the shoulder pad behind the neck. To secure the shoulder pad location (optional), pull the shoulder strap away from the shoulder pad, unfold the hook material on the top of the pad, then press the shoulder strap back onto the hook material to secure the pad in place. Attach the closure strap across the open top of the sling. Position the strap so that it holds the arm securely in the sling. Next, attach the thumb strap to the open end of the sling between the thumb and fingers. After sling has been fit, it may be easily removed and reapplied using the quick release buckle on shoulder strap. If a neutral pillow or 15 abduction pillow is included, place the pillow at the waistline. Attach the sling to the pillow, lining up hook material on the pillow with the loop on sling. Adjust the waist strap to fit.  If waist strap is too long, cut it to fit. Use the small piece of double sided hook material (located on top of the pillow) to secure the strap end. Place the double sided hook material on the inside of the cut strap end and secure it to the waist strap.     If no pillow is included, attach the waist strap to the sling and adjust to fit.    Washing Instructions:  Straps and sling must be removed and cleaned regularly depending on your activity level and perspiration. Hand wash straps and sling in cold water with mild detergent, rinse, air dry

## 2023-03-04 NOTE — H&P (Signed)
History of Present Illness:  Tamara [name] is a 77 y.o. female who presents today as a result of a referral from [facility], [place], for left shoulder pain and weakness.   The patient's symptoms began several months ago and developed without any space to cause or injury. The patient saw Horris Latino, PA-C, last month who gave her steroid injection. This injection provided little if any relief of her symptoms, so she was sent for an MRI scan and referred to me for further evaluation and treatment. This morning, the patient apparently lost her balance and fell onto her left shoulder, further aggravating her symptoms. The patient describes the symptoms as marked (major pain with significant limitations) and have the quality of being aching, constant, miserable, nagging, stabbing, tender, and throbbing. The pain is localized to the lateral arm/shoulder. These symptoms are aggravated constantly, with normal daily activities, with sleeping, and activity in general. She has tried non-steroidal anti-inflammatories (Celebrex and Naproxen ), narcotics , oral steroids , and Neurontin with limited benefit. She has tried rest with no significant benefit. She has tried the one injection described above with little if any benefit. She is status post a prior left shoulder arthroscopy with decompression and apparent rotator cuff repair from which she had done well until the recent flare up of her symptoms.  This complaint is not work related. She is a sports non-participant.  Shoulder Surgical History:  The patient has had a rotator cuff repair and a decompression in the past.  PMH/PSH/Family History/Social History/Meds/Allergies:  I have reviewed past medical, surgical, social and family history, medications and allergies as documented in the EMR.  Current Outpatient Medications:  albuterol MDI, PROVENTIL, VENTOLIN, PROAIR, HFA 90 mcg/actuation inhaler Inhale into the lungs  aspirin 81 MG EC tablet Take 1 tablet (81  mg total) by mouth once daily  clonazePAM (KLONOPIN) 0.5 MG tablet Take 0.5 mg by mouth once daily.  cyanocobalamin (VITAMIN B12) 1,000 mcg/mL injection Inject into the muscle monthly  ergocalciferol, vitamin D2, 1,250 mcg (50,000 unit) capsule Take 1 capsule (50,000 Units total) by mouth once a week 12 capsule 1  ferrous sulfate 220 mg (44 mg iron)/5 mL oral solution Take 220 mg by mouth once daily  gabapentin (NEURONTIN) 100 MG capsule Take 100 mg by mouth 3 (three) times daily  levoFLOXacin (LEVAQUIN) 500 MG tablet Take by mouth  methocarbamoL (ROBAXIN) 500 MG tablet Take 1 tablet by mouth every 6 (six) hours as needed  metoprolol tartrate (LOPRESSOR) 100 MG tablet  naproxen (NAPROSYN) 500 MG tablet Take 1 tablet (500 mg total) by mouth 2 (two) times daily with meals for 30 days 60 tablet 0  nortriptyline (PAMELOR) 25 MG capsule Take 25 mg by mouth nightly  omeprazole (PRILOSEC) 40 MG DR capsule Take 1 capsule (40 mg total) by mouth 2 (two) times daily  ondansetron (ZOFRAN-ODT) 4 MG disintegrating tablet Take 1 tablet (4 mg total) by mouth 4 (four) times daily as needed for Nausea 30 tablet 2  rosuvastatin (CRESTOR) 10 MG tablet Take 10 mg by mouth once daily  sertraline (ZOLOFT) 100 MG tablet Take 100 mg by mouth once daily  traZODone (DESYREL) 100 MG tablet Take 100 mg by mouth at bedtime   Allergies:  Eliquis [Apixaban] Nausea and Dizziness  Meloxicam Itching  Sulfa (Sulfonamide Antibiotics) Rash   Past Medical History:  Abdominal pain 10/18/2014  Last Assessment & Plan: Secondary to IBD and adhesions.  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 Refills given Lab Results Component Value Date VITAMINB12 263 01/26/2014  Anxiety state 05/06/2021  Last Assessment & Plan: Formatting of this note might be different from the original. secondary to son's recent AMI. She has been taking sertraline for years, 50 mg dose  along with clonazepam for insomnia. Advised to increase zoloft to 100 mg daily  Aortic atherosclerosis (CMS-HCC) 07/19/2020  Last Assessment & Plan: Formatting of this note might be different from the original. Reviewed findings of prior CT scan today.. Patient is willing to Initiate statin therpay starting with 10 mg crestor once daily Last Assessment & Plan: Formatting of this note might be different from the original. Reviewed findings of prior CT scan today.. Patient is tolerating statin therapy with 10 mg cre  B12 deficiency 01/26/2014  Last Assessment & Plan: Formatting of this note is different from the original. Advised to increase oral intake to daily. Repeat level needed Lab Results Component Value Date VITAMINB12 290 04/15/2016 Last Assessment & Plan: Formatting of this note is different from the original. Recurrent due to patient nonadherence to monthly injection schedule. She has crohn's of the small intestine an  Carpal tunnel syndrome 03/08/2014  Last Assessment & Plan: S/p CT release sugery by Micheal Minz. Last month. Doing well post operatively  Cataracts, bilateral  Chest pain 05/11/2013  Last Assessment & Plan: Recurrent, Atypical. Multiple CRFs. EKG and stat cardiac enzymes were negative for ischemic changes. Refer to [facility] for further evaluation  Chronic migraine without aura 09/10/2014  Last Assessment & Plan: Workup for vascular cause done after last visit, Advised her to increase pamelor to 25 mg DAILY.  Cough 03/08/2014  Last Assessment & Plan: Lungs clear but given long term cough in smoker, will get CXR today.  Crohn's disease (CMS/HHS-HCC)  diagnosed 2005  Crohn's disease in remission (CMS/HHS-HCC) 04/28/2019  DVT (deep venous thrombosis) (CMS/HHS-HCC) 04/24/2018  Emphysema with chronic bronchitis 01/28/2020  Last Assessment & Plan: Formatting of this note might be different from the original. She is asymptomatic currently and have been prescribed maintenance doses of Spiriva and  Symbicort  Encounter for preventive health examination 05/11/2013  Last Assessment & Plan: Sh eis at high risk for stroke and CAD . wellbutrin prescribed today after long discussio nof pros and cons of pharmacotherapy. Last Assessment & Plan: Formatting of this note is different from the original. She declines mammograms due to pain of procedure. She [place] up to date on other screenings. age appropriate education and counseling updated, referrals for preventat  Extremity pain 03/17/2014  GERD (gastroesophageal reflux disease)  Gouty arthropathy 10/08/2014  Headache 03/17/2014  Hematochezia 09/07/2020  History of bone density study 06/01/2009  History of bone density study 03/24/2012  History of Clostridium difficile infection 05/24/2021  History of hepatic disease 10/18/2014  Overview: s/p laparotomy/ LOA 3/08, prior reversal of jejunal bypasss 2004  History of tobacco abuse 01/26/2014  Last Assessment & Plan: Formatting of this note might be different from the original. She stopped smoking Oct 2021 using Wellbutrin  Intractable chronic migraine without aura and without status migrainosus 05/20/2019  Low vitamin D level 05/01/2019  Malaise 03/08/2014  Last Assessment & Plan: With several other long term complaints. We made an appointment with her PCP, Dr. Darrick Huntsman for tomorrow to discuss these concerns.  Malnutrition (CMS/HHS-HCC) 02/20/2018  Last Assessment & Plan: Formatting of this note might be different from the original. I have reviewed her diet and recommended that she increase her protein and fat intake while  monitoring her carbohydrates.  Moderate tricuspid insufficiency 12/15/2014  Numbness 03/17/2014  Orthostatic hypotension 12/13/2014  Last Assessment & Plan: Symptomatic ,reviewed list of meds and not taking a diuretic. Her Am cortisol was normal today, Advised to increase hydration and stop prn use of spironolactone, whci she has NOT been taking, . Will consider referral to cardiology for POTS if  persistent.  OSA (obstructive sleep apnea) 08/01/2019  Formatting of this note might be different from the original. Mild by home 2 night study Done in June 2020. autotitrating CPAP ordered Sept 21 2020 Last Assessment & Plan: Formatting of this note might be different from the original. Diagnosed by prior sleep study. Patient is using CPAP every night a minimum of 6 hours per night and notes improved daytime wakefulness and decreased fatigue  Osteoporosis  Pain in both upper extremities 08/24/2015  Personal history of other diseases of the digestive system 02/20/2015  Overview: s/p laparotomy/ LOA 3/08, prior reversal of jejunal bypasss 2004  Personal history of venous thrombosis and embolism 01/10/2007  Overview: post operative right leg, s/p vena cava filter  Pharyngoesophageal dysphagia 09/07/2020  Primary osteoarthritis of left knee 06/24/2017  Reflux esophagitis 01/24/2018  Last Assessment & Plan: Formatting of this note might be different from the original. Managed with omeprazole 20 mg daily  Regional enteritis (CMS/HHS-HCC) 10/18/2014  Last Assessment & Plan: Managed by Dr. Mechele Collin with mesalamine  Right lower quadrant abdominal pain 01/30/2021  S/P carpal tunnel release 05/18/2014  SBO (small bowel obstruction) (CMS/HHS-HCC) 2001 and 2004  Spinal stenosis, lumbar region, with neurogenic claudication 11/08/2016  Last Assessment & Plan: Formatting of this note might be different from the original. Confirmed by MRI with bilateral L4 nervie root impingement as well. Recommending neurosurgical referral.  Status post carpal tunnel release 04/29/2014  Steroid-induced osteoporosis 11/19/2011  Last Assessment & Plan: Formatting of this note might be different from the original. Given her smoking (ongoing) and history of hiatal hernia repair, Her options appear to be limited to Reclast given failure of Prolia due to cost. She sees endocrinology next week. Reviewed calcium and vitamin D needs. Last  Assessment & Plan: Formatting of this note might be different from the original. Her op  Tingling 03/01/2015  Tobacco abuse 03/23/2014  Underweight 08/27/2015  Last Assessment & Plan: Formatting of this note might be different from the original. Chronic, with slight improvement following resolution of dysphagia with Nissen funduplication takedown Last Assessment & Plan: Formatting of this note might be different from the original. She refuses to regard er low body weight as pathologic and does not want to gai weight. She is not bulemic or anorexic.  Vision problems 03/17/2014  Vitamin D deficiency 08/27/2015  Last Assessment & Plan: Formatting of this note might be different from the original. Recurrent, Will continue weekly vitamin d 50,000 IUs Last Assessment & Plan: Formatting of this note is different from the original. With osteoporosis and recent fall and fracture. Continue current supplementation Last vitamin D Lab Results Component Value Date VD25OH 43.98 01/29/2021  Weight loss, abnormal 09/07/2020   Past Surgical History:  SIGMOIDOSCOPY FLEXIBLE 05/22/1998  COLONOSCOPY 04/25/2004 (Hyperplastic Polyp)  CAPSULE ENDOSCOPY 05/02/2004  FUSION OF C6-7 ANTERIOR CERVICAL APPROACH 10/30/2004  CAPSULE ENDOSCOPY 08/28/2005  Right Carpal Tunnel Release 04/22/2014  right long finger trigger release Right 09/18/2015  BACK SURGERY 12/25/2016  L4-L5 decompression  EGD 07/22/2017 (No repeat per RTE)  COLONOSCOPY 05/10/2019 (PH Crohn's; Adenomatous Polyp: CBF 04/2024)  Left knee arthroscopy, partial medial menisectomy, chondroplasty of patellofemoral  and medial compartments Left 06/22/2020 (Dr. Allena Katz)  COLONOSCOPY 09/26/2020 (Negative colon biopsies/PHx Crohn's/Repeat 89yrs/TKT)  EGD 09/26/2020 (Normal EGD biopsy/PHx GERD/REpeat 63yrs/TKT)  Left knee arthroscopy, partial synovectomy with infrapatellar fat pad debridement, chondroplasty of patellofemoral and medial compartments Left 05/25/2021 (Dr. Allena Katz)   Excision and reimplantation of traumatic neuroma of infrapatellar branch of saphenous nerve, repair of medial retinacular defect, left knee Left 09/18/2021 (Dr.Shedrick Sarli)  Right L4-5 far lateral discectomy 11/06/2021 (Dr Venetia Night at [facility])  L4-5 transforaminal lumbar interbody fusion 04/01/2022 (Dr Myer Haff [facility] [place])  ANAL SPHINCTER PROSTHESIS PLACEMENT  APPENDECTOMY  CHOLECYSTECTOMY OPEN  COLONOSCOPY 03/21/2014, 02/01/2009, 01/29/2006  PH Crohn's Disease: CBF 03/2019: Recall ltr mailed  EGD 09/01/2013, 02/07/2009, 01/05/2007, 05/06/2005, 03/17/1995  No repeat per RTE  HYSTERECTOMY VAGINAL  Left Long Finger Trigger Release 08/25/15 (Dr Rosita Kea)  Spleen removal (patient had a colonoscopy in [place] and they ruptured her spleen)  TONSILLECTOMY AND ADENOIDECTOMY   Family History:  Myocardial Infarction (Heart attack) Mother  Diabetes Mother  Osteoporosis (Thinning of bones) Mother  Rheum arthritis Mother  Myocardial Infarction (Heart attack) Father  Myocardial Infarction (Heart attack) Sister   Social History:   Socioeconomic History:  Marital status: Widowed  Tobacco Use  Smoking status: Every Day  Current packs/day: 0.00  Average packs/day: 1 pack/day for 13.0 years (13.0 ttl pk-yrs)  Types: Cigarettes  Start date: 03/22/2009  Last attempt to quit: 03/22/2022  Years since quitting: 0.9  Smokeless tobacco: Never  Vaping Use  Vaping status: Never Used  Substance and Sexual Activity  Alcohol use: No  Alcohol/week: 0.0 standard drinks of alcohol  Drug use: Never  Sexual activity: Not Currently   Social Determinants of Health:   Financial Resource Strain: Low Risk (02/21/2022)  Received from [facility], [place]  Overall Financial Resource Strain (CARDIA)  Difficulty of Paying Living Expenses: Not hard at all  Food Insecurity: No Food Insecurity (02/21/2022)  Received from [facility], [place]  Hunger Vital Sign  Worried About Running Out of Food in the  Last Year: Never true  Ran Out of Food in the Last Year: Never true  Transportation Needs: No Transportation Needs (02/21/2022)  Received from [facility], [place]  PRAPARE - Transportation  Lack of Transportation (Medical): No  Lack of Transportation (Non-Medical): No   Review of Systems:  A comprehensive 14 point ROS was performed, reviewed, and the pertinent orthopaedic findings are documented in the HPI.  Physical Exam:  Vitals:  03/03/23 1441  BP: 132/70  Weight: (!) 41.3 kg (91 lb)  Height: 154.9 cm (5\' 1" )  PainSc: 8  PainLoc: Shoulder   General/Constitutional: The patient appears to be well-nourished, well-developed, and in no acute distress. Neuro/Psych: Normal mood and affect, oriented to person, place and time. Eyes: Non-icteric. Pupils are equal, round, and reactive to light, and exhibit synchronous movement. ENT: Unremarkable. Lymphatic: No palpable adenopathy. Respiratory: Lungs clear to auscultation, Normal chest excursion, No wheezes, and Non-labored breathing Cardiovascular: Regular rate and rhythm. No murmurs. and No edema, swelling or tenderness, except as noted in detailed exam. Integumentary: No impressive skin lesions present, except as noted in detailed exam. Musculoskeletal: Unremarkable, except as noted in detailed exam.  Left shoulder exam: SKIN: Well-healed arthroscopic portal sites, as well as a small contusion over the posterolateral corner of the acromion, otherwise unremarkable. SWELLING: mild WARMTH: none LYMPH NODES: no adenopathy palpable CREPITUS: none TENDERNESS: Moderately tender over superior aspect of shoulder ROM (active): Patient refuses to perform any active motion of the shoulder due  to pain. ROM (passive): Patient refuses to allow me to perform any passive range of motion of the shoulder due to pain.  STRENGTH: Forward flexion: Not tested today Abduction: Not tested today External rotation: 3/5 Internal rotation: 3/5 Pain with  RC testing: Yes  STABILITY: Not evaluated  SPECIAL TESTS: Juanetta Gosling' test: Not evaluated Speed's test: Not evaluated Capsulitis - pain w/ passive ER: Not evaluated Crossed arm test: Not evaluated Crank: Not evaluated Anterior apprehension: Not evaluated Posterior apprehension: Not evaluated  She is neurovascularly intact to the left upper extremity.  Imaging:  Shoulder X-Ray Imaging: True AP, Y-scapular, and axillary views of the left shoulder are obtained. These films demonstrate no evidence for fractures, lytic lesions, or significant degenerative changes. The subacromial space is mildly decreased. There is no subacromial or infra-clavicular spurring. She demonstrates a Type I-II acromion.  Left Shoulder Imaging, MRI: MRI Shoulder Cartilage: No cartilage abnormality. MRI Shoulder Rotator Cuff: Full thickness tear of the supraspinatus. Retracted to the humeral head. MRI Shoulder Labrum / Biceps: Biceps tendinopathy. MRI Shoulder Bone: Osteopenic bone, otherwise unremarkable.  Both the films and report were reviewed by myself and discussed with the patient and her son.  Assessment:  1. Nontraumatic complete tear of left rotator cuff.  2. Rotator cuff tendinitis, left.  3. Contusion of left shoulder.  4. Injury of tendon of long head of left biceps.   Plan:  The treatment options were discussed with the patient and her son. In addition, patient educational materials were provided regarding the diagnosis and treatment options. The patient is quite frustrated by her symptoms and functional limitations, and is ready to consider more aggressive treatment options. Therefore, I have recommended a surgical procedure, specifically a left shoulder arthroscopy with debridement, decompression, rotator cuff repair, and possible biceps tenodesis. The procedure was discussed with the patient, as were the potential risks (including bleeding, infection, nerve and/or blood vessel injury, persistent or  recurrent pain, failure of the repair, progression of arthritis, need for further surgery, blood clots, strokes, heart attacks and/or arhythmias, pneumonia, etc.) and benefits. The patient states her understanding and wishes to proceed. All of the patient's questions and concerns were answered. She can call any time with further concerns. She will follow up post-surgery, routine.    H&P reviewed and patient re-examined. No changes.

## 2023-03-04 NOTE — Anesthesia Preprocedure Evaluation (Addendum)
Anesthesia Evaluation  Patient identified by MRN, date of birth, ID band Patient awake    Reviewed: Allergy & Precautions, NPO status , Patient's Chart, lab work & pertinent test results  History of Anesthesia Complications Negative for: history of anesthetic complications  Airway Mallampati: III  TM Distance: >3 FB Neck ROM: full    Dental  (+) Poor Dentition   Pulmonary sleep apnea , COPD, Current Smoker   Pulmonary exam normal        Cardiovascular + CAD  Normal cardiovascular exam+ Valvular Problems/Murmurs   INTERPRETATION  NORMAL LEFT VENTRICULAR SYSTOLIC FUNCTION WITH AN ESTIMATED EF = >55 %  NORMAL RIGHT VENTRICULAR SYSTOLIC FUNCTION  MILD-TO-MODERATE TRICUSPID VALVE INSUFFICIENCY  MILD MITRAL VALVE INSUFFICIENCY  NO VALVULAR STENOSIS  TECHNICALLY DIFFICULT STUDY     Neuro/Psych  Headaches PSYCHIATRIC DISORDERS Anxiety Depression    TIA Neuromuscular disease    GI/Hepatic Neg liver ROS, hiatal hernia,GERD  Medicated,,  Endo/Other  negative endocrine ROS    Renal/GU      Musculoskeletal   Abdominal   Peds  Hematology negative hematology ROS (+)   Anesthesia Other Findings Past Medical History: 05/2021: Acute deep vein thrombosis (DVT) of left lower extremity 01/2007: Acute deep vein thrombosis (DVT) of right lower extremity     Comment:  a.) occurred postoperatively; s/p IVC filter placement 04/24/2018: Acute deep vein thrombosis (DVT) of right lower extremity 12/13/2014: Amaurosis fugax     Comment:   Brain MRA suggested vertebral artery partial occlusion               75 to 90% stenosed. Will treat for prevention of embolic               stroke,  Refer to [facility] for CTA and               stent and refer to cardiology for ECHO  07/06/2021: Anticoagulated on Coumadin     Comment:  New regimen 6 mg 5 days per week,  5 mg 2 days per week               given INR of 1.9  On  5/6alternating        Lab Results                Component  Value  Date    INR  1.9 (H)  07/06/2021                  INR  2.9 (H)  06/18/2021    INR  1.6 (H)  06/11/2021                 3.1 on 12/06/21 INR, held one dose of coumadin since on 5               additional days of augmentin, she has restarted her               current dose of Coumadin  tablet once with coumadin                table No date: Anxiety     Comment:  a.) on BZO (clonazepam) PRN No date: Aortic atherosclerosis No date: Arthritis No date: B12 deficiency No date: Carpal tunnel syndrome 03/08/2014: Carpal tunnel syndrome No date: Cataracts, bilateral 05/24/2021: Clostridium difficile infection No date: COPD (chronic obstructive pulmonary disease)     Comment:  no inhalers No date: Crohn's disease No date: Depression 12/06/2021: Dog  bite 12/04/2017: Dysphagia 07/11/2018: Facial twitching No date: GERD (gastroesophageal reflux disease) 06/14/1997: History of cardiac catheterization     Comment:  a.) LHC 06/14/1997 at [facility]: EF 60%; normal coronaries;               no obstructive CAD. No date: History of hiatal hernia No date: History of kidney stones No date: Hx of splenectomy     Comment:  a.) reports spleen was "ruptured" during routine               colonoscopy in [place], which necessitated surgical               intervention 05/20/2019: Intractable chronic migraine without aura and without  status migrainosus No date: Leukocytosis No date: Long term current use of anticoagulant     Comment:  a.) warfarin No date: Moderate tricuspid insufficiency 12/2016: MRSA colonization No date: Osteoporosis No date: Pneumonia 01/24/2018: Reflux esophagitis No date: Small bowel obstruction     Comment:  a.) 2001, 2004 (reversal of jejunal bypass). b.) s/p               laparotomy and lysis of adhesions 01/2007 No date: Spinal stenosis of lumbar region No date: Vitamin D deficiency  Past Surgical  History: No date: ANAL SPHINCTER PROSTHESIS PLACEMENT No date: APPENDECTOMY 06/14/1997: CARDIAC CATHETERIZATION; Left     Comment:  Procedure: CARDIAC CATHETERIZATION; Location: [facility];               Surgeon: Lieutenant Diego, MD 04/22/2014: CARPAL TUNNEL RELEASE; Right No date: CARPAL TUNNEL RELEASE; Left No date: CATARACT EXTRACTION; Bilateral 2005: CERVICAL FUSION     Comment:  C6-7, anterior approach No date: CHOLECYSTECTOMY No date: COLONOSCOPY     Comment:  2005, 2007, 2010, 2015, 2020, 2021 05/10/2019: COLONOSCOPY WITH PROPOFOL; N/A     Comment:  Procedure: COLONOSCOPY WITH PROPOFOL;  Surgeon: Scot Jun, MD;  Location: [facility] ENDOSCOPY;  Service:               Endoscopy;  Laterality: N/A; 02/11/2018: ESOPHAGEAL MANOMETRY; N/A     Comment:  Procedure: ESOPHAGEAL MANOMETRY (EM);  Surgeon: Midge Minium, MD;  Location: [facility] ENDOSCOPY;  Service:               Endoscopy;  Laterality: N/A; No date: ESOPHAGOGASTRODUODENOSCOPY     Comment:  1996, 2006, 2008, 2010, 2014, 2018, 2021 12/17/2017: ESOPHAGOGASTRODUODENOSCOPY (EGD) WITH PROPOFOL; N/A     Comment:  Procedure: ESOPHAGOGASTRODUODENOSCOPY (EGD) WITH               PROPOFOL;  Surgeon: Earline Mayotte, MD;  Location:               [facility] ENDOSCOPY;  Service: Endoscopy;  Laterality: N/A; 01/08/2018: ESOPHAGOGASTRODUODENOSCOPY (EGD) WITH PROPOFOL; N/A     Comment:  Procedure: ESOPHAGOGASTRODUODENOSCOPY (EGD) WITH               PROPOFOL;  Surgeon: Earline Mayotte, MD;  Location:               [facility] ENDOSCOPY;  Service: Endoscopy;  Laterality: N/A; 09/18/2021: EXCISION NEUROMA; Left     Comment:  Procedure: Excision of traumatic neuroma infrapatellar               branch saphenous nerve left knee.;  Surgeon: Leron Croak  J, MD;  Location: [facility] ORS;  Service: Orthopedics;                Laterality: Left; No date: GIVENS CAPSULE STUDY     Comment:  2005, 2006 No date: INSERTION OF VENA CAVA  FILTER; Right 06/22/2020: KNEE ARTHROSCOPY WITH MEDIAL MENISECTOMY; Left     Comment:  Procedure: Left knee arthroscopic partial medial               meniscectomy;  Surgeon: Signa Kell, MD;  Location:               [facility];  Service: Orthopedics;  Laterality:               Left; 05/25/2021: KNEE ARTHROSCOPY WITH MEDIAL MENISECTOMY; Left     Comment:  Procedure: Left knee arthroscopy, infrapatellar fat pad               excision;  Surgeon: Signa Kell, MD;  Location: [facility]               ORS;  Service: Orthopedics;  Laterality: Left; 01/30/2007: LAPAROTOMY     Comment:  for bowel obstruction with LOA 12/25/2016: LUMBAR LAMINECTOMY/DECOMPRESSION MICRODISCECTOMY; N/A     Comment:  Procedure: LUMBAR DECOMPRESSION L4-5;  Surgeon: Venetia Night, MD;  Location: [facility] ORS;  Service:               Neurosurgery;  Laterality: N/A; No date: NISSEN FUNDOPLICATION No date: ROTATOR CUFF REPAIR; Bilateral No date: SPLENECTOMY     Comment:  secondary to colonoscopy No date: TONSILLECTOMY AND ADENOIDECTOMY 04/01/2022: TRANSFORAMINAL LUMBAR INTERBODY FUSION (TLIF) WITH PEDICLE  SCREW FIXATION 1 LEVEL; N/A     Comment:  Procedure: OPEN L4-5 TRANSFORAMINAL LUMBAR INTERBODY               FUSION (TLIF);  Surgeon: Venetia Night, MD;                Location: [facility] ORS;  Service: Neurosurgery;  Laterality:               N/A; 08/25/2015: TRIGGER FINGER RELEASE; Left 09/18/2015: TRIGGER FINGER RELEASE; Right No date: VAGINAL HYSTERECTOMY  BMI    Body Mass Index: 15.94 kg/m      Reproductive/Obstetrics negative OB ROS                             Anesthesia Physical Anesthesia Plan  ASA: 3  Anesthesia Plan: General ETT   Post-op Pain Management: Regional block*, Ofirmev IV (intra-op)* and Toradol IV (intra-op)*   Induction: Intravenous  PONV Risk Score and Plan: 3 and Ondansetron, Dexamethasone, Midazolam and Treatment may vary due to age  or medical condition  Airway Management Planned: Oral ETT  Additional Equipment:   Intra-op Plan:   Post-operative Plan: Extubation in OR  Informed Consent: I have reviewed the patients History and Physical, chart, labs and discussed the procedure including the risks, benefits and alternatives for the proposed anesthesia with the patient or authorized representative who has indicated his/her understanding and acceptance.     Dental Advisory Given  Plan Discussed with: Anesthesiologist, CRNA and Surgeon  Anesthesia Plan Comments: (Patient consented for risks of anesthesia including but not limited to:  - adverse reactions to medications - damage to eyes, teeth, lips or other oral mucosa - nerve damage due to positioning  -  sore throat or hoarseness - Damage to heart, brain, nerves, lungs, other parts of body or loss of life  Patient voiced understanding.)        Anesthesia Quick Evaluation

## 2023-03-04 NOTE — Anesthesia Procedure Notes (Signed)
Anesthesia Regional Block: Interscalene brachial plexus block   Pre-Anesthetic Checklist: , timeout performed,  Correct Patient, Correct Site, Correct Laterality,  Correct Procedure, Correct Position, site marked,  Risks and benefits discussed,  Surgical consent,  Pre-op evaluation,  At surgeon's request and post-op pain management  Laterality: Left  Prep: alcohol swabs       Needles:  Injection technique: Single-shot  Needle Type: Echogenic Needle     Needle Length: 4cm  Needle Gauge: 25     Additional Needles:   Procedures:,,,, ultrasound used (permanent image in chart),,    Narrative:  Start time: 03/04/2023 1:58 PM End time: 03/04/2023 2:02 PM Injection made incrementally with aspirations every 5 mL.  Performed by: Personally  Anesthesiologist: Louie Boston, MD  Additional Notes: Patient's chart reviewed and they were deemed appropriate candidate for procedure, at surgeon's request. Patient educated about risks, benefits, and alternatives of the block including but not limited to: temporary or permanent nerve damage, bleeding, infection, damage to surround tissues, pneumothorax, hemidiaphragmatic paralysis, unilateral Horner's syndrome, block failure, local anesthetic toxicity. Patient expressed understanding. A formal time-out was conducted consistent with institution rules.  Monitors were applied, and minimal sedation used (see nursing record). The site was prepped with skin prep and allowed to dry, and sterile gloves were used. A high frequency linear ultrasound probe with probe cover was utilized throughout. C5-7 nerve roots located and appeared anatomically normal, local anesthetic injected around them, and echogenic block needle trajectory was monitored throughout. Aspiration performed every 5ml. Lung and blood vessels were avoided. All injections were performed without resistance and free of blood and paresthesias. The patient tolerated the procedure well.  Injectate:  10ml exparel + 10ml 0.5% bupivacaine

## 2023-03-04 NOTE — Op Note (Signed)
03/04/2023  4:37 PM  Patient:   Tamara [name]  Pre-Op Diagnosis:   Impingement/tendinopathy with rotator cuff tear and biceps tendinopathy, left shoulder.  Post-Op Diagnosis:   Impingement/tendinopathy with full-thickness rotator cuff tear, degenerative labral fraying, and biceps tendinopathy, left shoulder.  Procedure:   Extensive arthroscopic debridement, arthroscopic subacromial decompression, and mini-open rotator cuff repair, left shoulder.  Anesthesia:   General endotracheal with interscalene block using Exparel placed preoperatively by the anesthesiologist.  Surgeon:   Maryagnes Amos, MD  Assistant:   Patient's Cheree Ditto, RNFA  Findings:   As above. There was a full-thickness tear involving the anterior and mid insertional fibers of the supraspinatus tendon. The remainder of the rotator cuff was in satisfactory condition. The long head of the biceps tendon had ruptured and retracted distally, leaving an approximately 3 cm stump of tissue in the joint. There was moderate fraying of the labrum superiorly and posterior superiorly without frank detachment from the glenoid rim. The articular surfaces of the glenoid and humerus both were in satisfactory condition.  Complications:   None  Fluids:   700 cc  Estimated blood loss:   5 cc  Tourniquet time:   None  Drains:   None  Closure:   Staples      Brief clinical note:   The patient is a 78 year old female with a several month history of worsening left shoulder pain without antecedent injury. The patient's symptoms have progressed despite medications, activity modification, etc. The patient's history and examination are consistent with impingement/tendinopathy with a probable rotator cuff tear. These findings were confirmed by MRI scan. The patient presents at this time for definitive management of these shoulder symptoms.  Procedure:   The patient underwent placement of an interscalene block using Exparel by the anesthesiologist  in the preoperative holding area before being brought into the operating room and lain in the supine position. The patient then underwent general endotracheal intubation and anesthesia before being repositioned in the beach chair position using the beach chair positioner. The left shoulder and upper extremity were prepped with ChloraPrep solution before being draped sterilely. Preoperative antibiotics were administered. A timeout was performed to confirm the proper surgical site before the expected portal sites and incision site were injected with 0.5% Sensorcaine with epinephrine.   A posterior portal was created and the glenohumeral joint thoroughly inspected with the findings as described above. An anterior portal was created using an outside-in technique. The labrum and rotator cuff were further probed, again confirming the above-noted findings. The areas of labral fraying were debrided back to stable margins using the full-radius resector, as were areas of synovitis. The torn margin of the rotator cuff also was debrided back to stable margins using the full-radius resector. Finally, the portion of the biceps tendon remaining intra-articularly was released from its attachment to the glenoid rim and removed. The ArthroCare wand was inserted and used to obtain hemostasis as well as to "anneal" the labrum superiorly and anteriorly. The instruments were removed from the joint after suctioning the excess fluid.  The camera was repositioned through the posterior portal into the subacromial space. A separate lateral portal was created using an outside-in technique. The 3.5 mm full-radius resector was introduced and used to perform a subtotal bursectomy. The ArthroCare wand was then inserted and used to remove the periosteal tissue off the undersurface of the anterior third of the acromion as well as to recess the coracoacromial ligament from its attachment along the anterior and lateral margins of the  acromion. The  4.0 mm acromionizing bur was introduced and used to complete the decompression by removing the undersurface of the anterior third of the acromion. The full radius resector was reintroduced to remove any residual bony debris before the ArthroCare wand was reintroduced to obtain hemostasis. The instruments were then removed from the subacromial space after suctioning the excess fluid.  An approximately 4-5 cm incision was made over the anterolateral aspect of the shoulder beginning at the anterolateral corner of the acromion and extending distally in line with the bicipital groove. This incision was carried down through the subcutaneous tissues to expose the deltoid fascia. The raphae between the anterior and middle thirds was identified and this plane developed to provide access into the subacromial space. Additional bursal tissues were debrided sharply using Metzenbaum scissors. The rotator cuff tear was readily identified. The margins were debrided sharply with a #15 blade and the exposed greater tuberosity roughened with a rongeur. The tear was repaired using two [employer] 2.9 mm Q-Fix anchors. These sutures were then brought back laterally and secured using one [employer] Nephew Healicoil knotless RegeneSorb anchor to create a two-layer closure. An apparent watertight closure was obtained.  Given the relative poor quality of rotator cuff tendon tissue, it was felt best to augment this repair with a [employer] Nephew Regeneten patch in order to optimize the healing potential of this repair.  Therefore, a medium-sized patch was applied over the rotator cuff repair and secured using the appropriate bone and soft tissue staples.  The addition of this procedure added an additional level of complexity to the case and increasing the length of time by approximately 20 minutes.  The wound was copiously irrigated with sterile saline solution before the deltoid raphae was reapproximated using 2-0 Vicryl interrupted  sutures. The subcutaneous tissues also were closed using 2-0 Vicryl interrupted sutures before the skin was closed using staples. The portal sites also were closed using staples. A sterile bulky dressing was applied to the shoulder before the arm was placed into a shoulder immobilizer. The patient was then awakened, extubated, and returned to the recovery room in satisfactory condition after tolerating the procedure well.

## 2023-03-05 ENCOUNTER — Encounter: Payer: Self-pay | Admitting: Surgery

## 2023-03-10 ENCOUNTER — Encounter: Payer: Self-pay | Admitting: Surgery

## 2023-03-13 ENCOUNTER — Other Ambulatory Visit: Payer: Self-pay | Admitting: Internal Medicine

## 2023-03-13 DIAGNOSIS — R011 Cardiac murmur, unspecified: Secondary | ICD-10-CM | POA: Diagnosis not present

## 2023-03-13 DIAGNOSIS — R634 Abnormal weight loss: Secondary | ICD-10-CM | POA: Diagnosis not present

## 2023-03-13 DIAGNOSIS — I700 Atherosclerosis of aorta: Secondary | ICD-10-CM | POA: Diagnosis not present

## 2023-03-13 DIAGNOSIS — R0789 Other chest pain: Secondary | ICD-10-CM | POA: Diagnosis not present

## 2023-03-13 DIAGNOSIS — G4733 Obstructive sleep apnea (adult) (pediatric): Secondary | ICD-10-CM | POA: Diagnosis not present

## 2023-03-13 DIAGNOSIS — R110 Nausea: Secondary | ICD-10-CM | POA: Diagnosis not present

## 2023-03-13 DIAGNOSIS — Z8673 Personal history of transient ischemic attack (TIA), and cerebral infarction without residual deficits: Secondary | ICD-10-CM | POA: Diagnosis not present

## 2023-03-13 DIAGNOSIS — Z86718 Personal history of other venous thrombosis and embolism: Secondary | ICD-10-CM | POA: Diagnosis not present

## 2023-03-13 DIAGNOSIS — J449 Chronic obstructive pulmonary disease, unspecified: Secondary | ICD-10-CM | POA: Diagnosis not present

## 2023-03-13 DIAGNOSIS — K219 Gastro-esophageal reflux disease without esophagitis: Secondary | ICD-10-CM | POA: Diagnosis not present

## 2023-03-13 DIAGNOSIS — R0602 Shortness of breath: Secondary | ICD-10-CM | POA: Diagnosis not present

## 2023-03-13 DIAGNOSIS — K2100 Gastro-esophageal reflux disease with esophagitis, without bleeding: Secondary | ICD-10-CM | POA: Diagnosis not present

## 2023-03-13 DIAGNOSIS — R636 Underweight: Secondary | ICD-10-CM | POA: Diagnosis not present

## 2023-03-13 DIAGNOSIS — K5090 Crohn's disease, unspecified, without complications: Secondary | ICD-10-CM | POA: Diagnosis not present

## 2023-03-13 DIAGNOSIS — K50912 Crohn's disease, unspecified, with intestinal obstruction: Secondary | ICD-10-CM | POA: Diagnosis not present

## 2023-03-13 NOTE — Anesthesia Postprocedure Evaluation (Signed)
Anesthesia Post Note  Patient: EVERLYNN SAGUN  Procedure(s) Performed: LEFT SHOULDER ARTHROSCOPY WITH DEBRIDEMENT, DECOMPRESSION, ROATOR CUFF REPAIR, AND POSSIBLE BICEPS TENODESIS. - RNFA (Left: Shoulder)  Patient location during evaluation: PACU Anesthesia Type: General Level of consciousness: awake and alert Pain management: pain level controlled Vital Signs Assessment: post-procedure vital signs reviewed and stable Respiratory status: spontaneous breathing, nonlabored ventilation, respiratory function stable and patient connected to nasal cannula oxygen Cardiovascular status: blood pressure returned to baseline and stable Postop Assessment: no apparent nausea or vomiting Anesthetic complications: no   No notable events documented.   Last Vitals:  Vitals:   03/04/23 1700 03/04/23 1724  BP: 135/66 120/67  Pulse: 98 93  Resp: 19 20  Temp:  36.6 C  SpO2: 96% 94%    Last Pain:  Vitals:   03/05/23 0845  TempSrc:   PainSc: 0-No pain                 Yevette Edwards

## 2023-03-14 DIAGNOSIS — M25512 Pain in left shoulder: Secondary | ICD-10-CM | POA: Diagnosis not present

## 2023-03-14 DIAGNOSIS — M25412 Effusion, left shoulder: Secondary | ICD-10-CM | POA: Diagnosis not present

## 2023-03-18 DIAGNOSIS — M25512 Pain in left shoulder: Secondary | ICD-10-CM | POA: Diagnosis not present

## 2023-03-18 DIAGNOSIS — M25412 Effusion, left shoulder: Secondary | ICD-10-CM | POA: Diagnosis not present

## 2023-03-19 IMAGING — MR MR LUMBAR SPINE W/O CM
5 series · 31 of 48 positions shown · non-contrast
Comparison: MR lumbar 11/07/2016; X-ray lumbar 10/23/2016.

CLINICAL DATA: Low back pain that radiates from right hip to foot
with leg numbness. Status post knee surgery in [REDACTED].

EXAM:
MRI LUMBAR SPINE WITHOUT CONTRAST
TECHNIQUE: Multiplanar, multisequence MR imaging of the lumbar spine was
performed. No intravenous contrast was administered.

[Series 5: T2 · sagittal · 4.0mm · 0.81mm/px · 6 of 17 slices shown (1 of 2)]
[im 1/17]
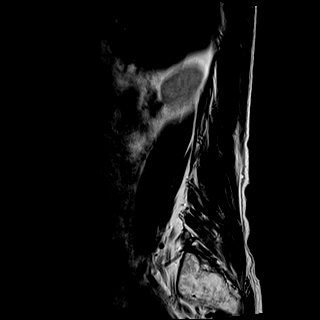
[im 4/17]
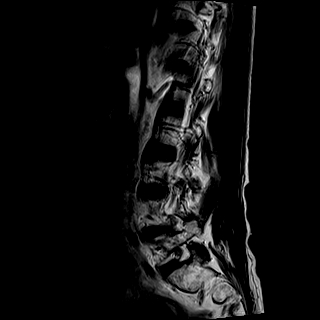
[im 7/17]
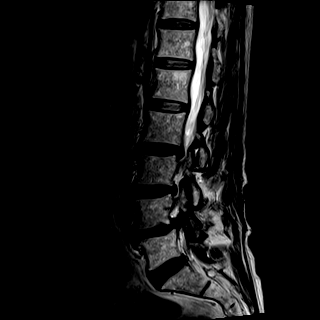
[im 10/17]
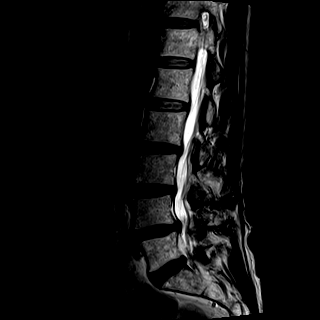
[im 13/17]
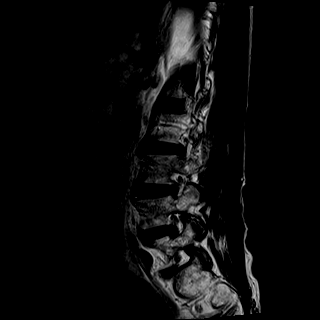
[im 17/17]
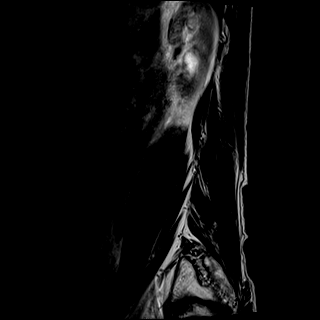

[Series 6: T1 · sagittal · 4.0mm · 0.81mm/px · 7 of 17 slices shown (1 of 2)]
[im 1/17]
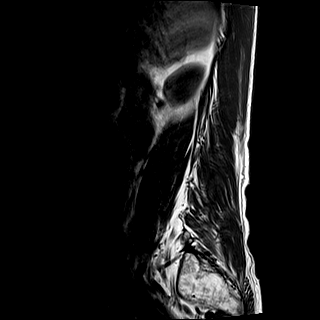
[im 3/17]
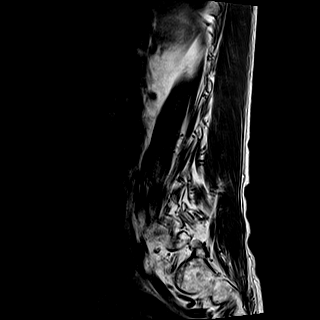
[im 6/17]
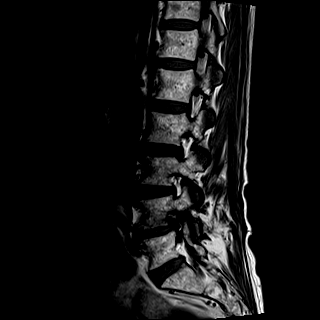
[im 9/17]
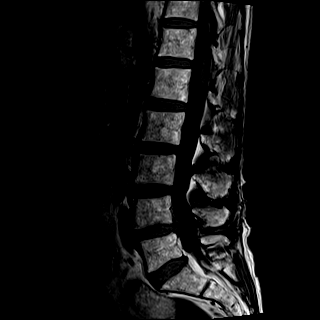
[im 11/17]
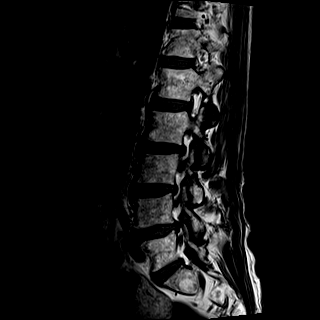
[im 14/17]
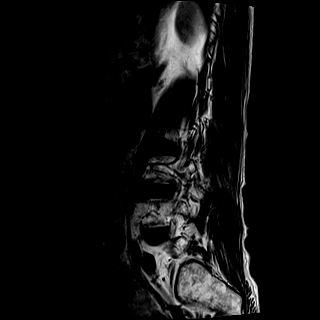
[im 17/17]
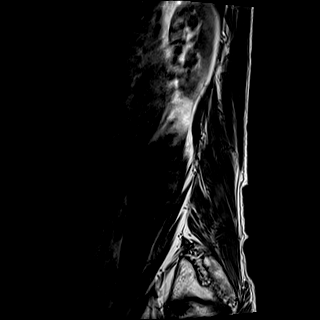

[Series 7: STIR · sagittal · 4.0mm · 0.41mm/px · 2 of 17 slices shown]
[im 1/17]
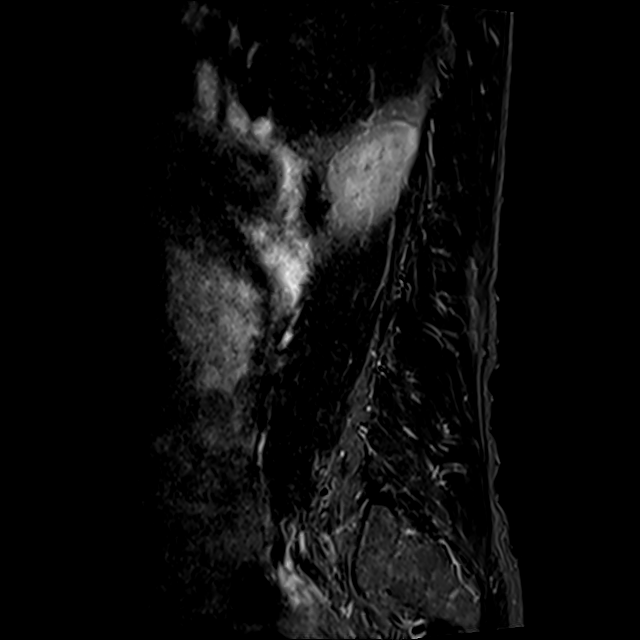
[im 3/17]
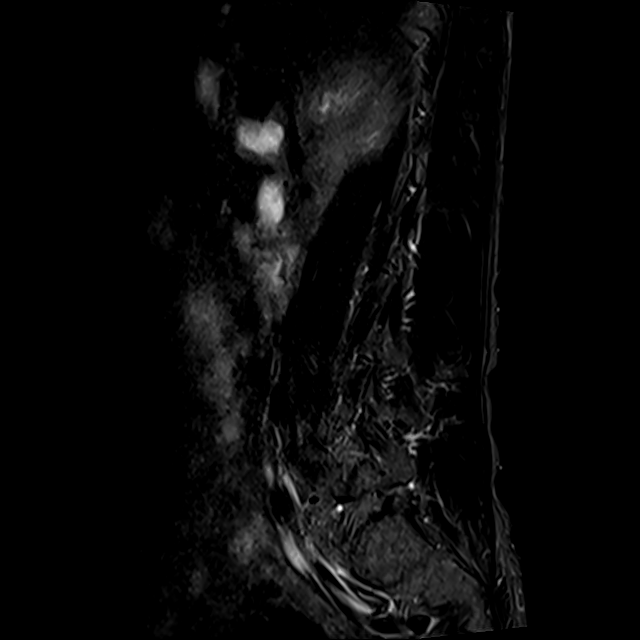

[Series 8: T2 · axial · 4.0mm · 0.78mm/px · z∈[-193,+24]mm · 8 of 36 slices shown (2 of 2)]
[im 1/36]
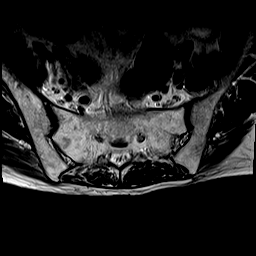
[im 6/36]
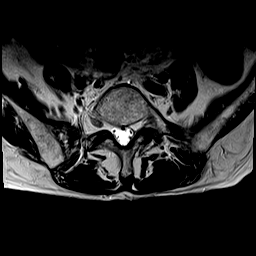
[im 11/36]
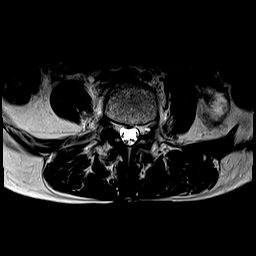
[im 17/36]
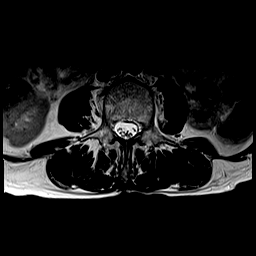
[im 19/36]
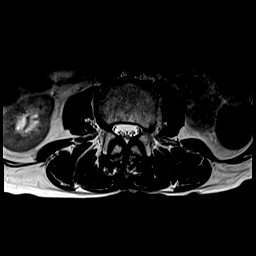
[im 25/36]
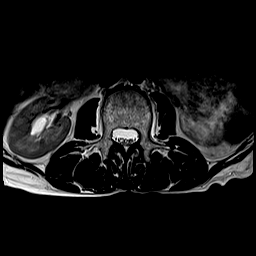
[im 30/36]
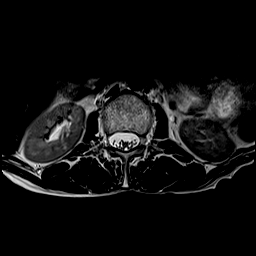
[im 36/36]
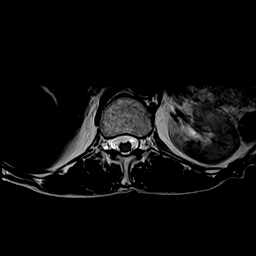

[Series 9: T1 · axial · 4.0mm · 0.39mm/px · z∈[-193,+24]mm · 8 of 36 slices shown (2 of 2)]
[im 1/36]
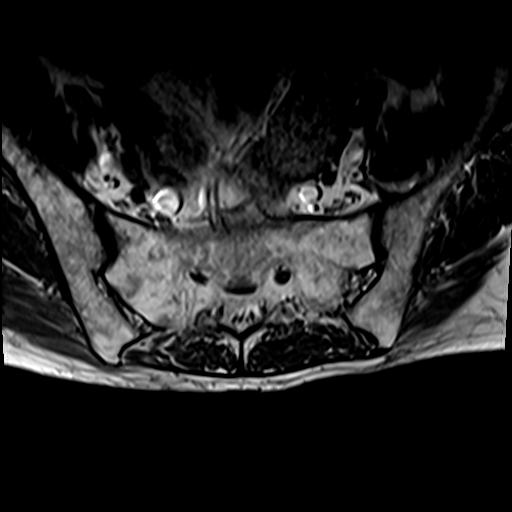
[im 6/36]
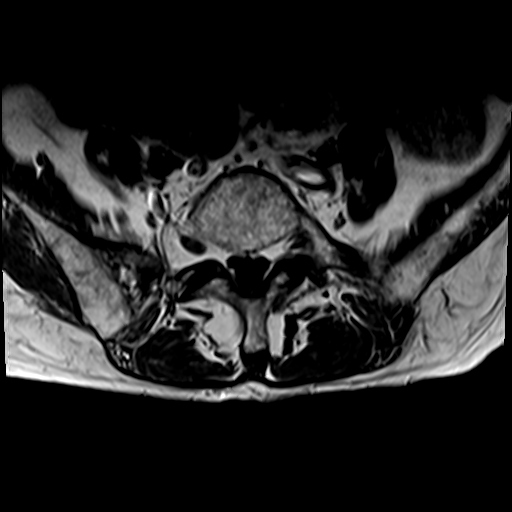
[im 11/36]
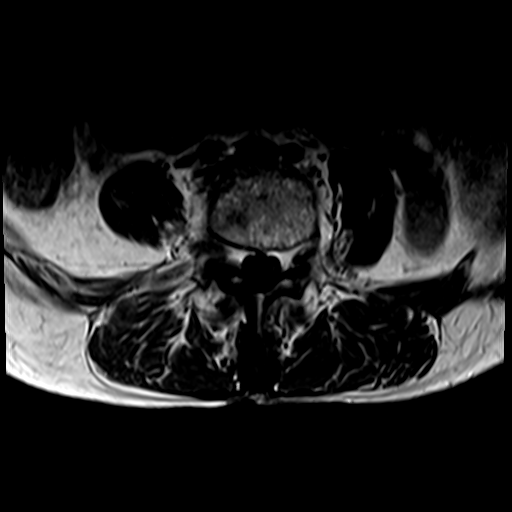
[im 17/36]
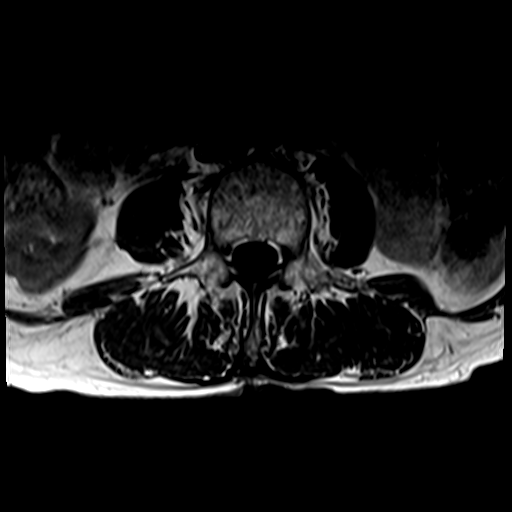
[im 19/36]
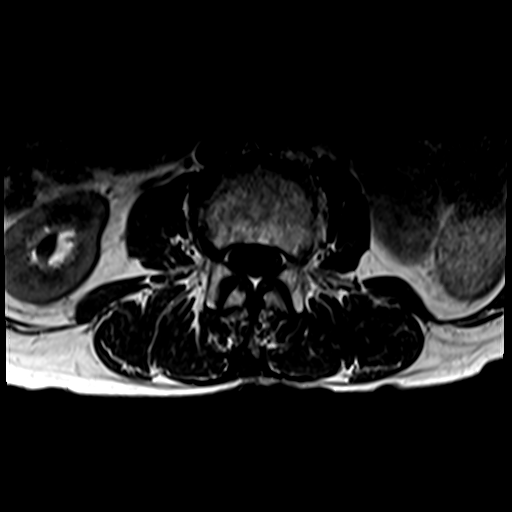
[im 25/36]
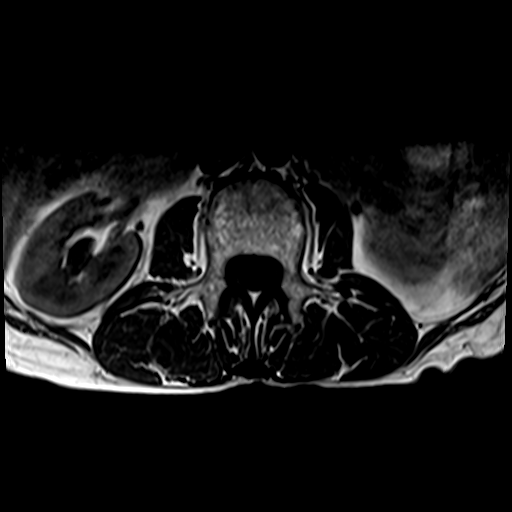
[im 30/36]
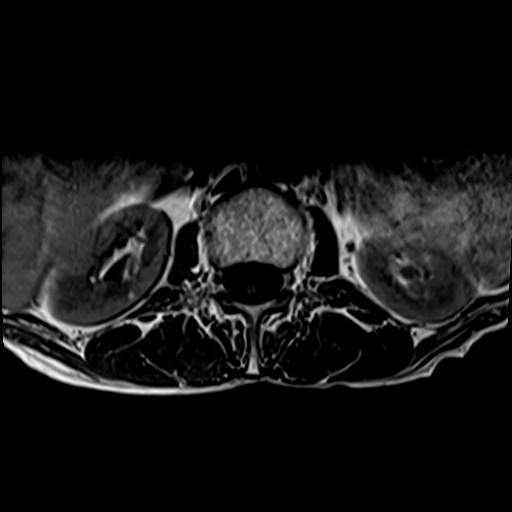
[im 36/36]
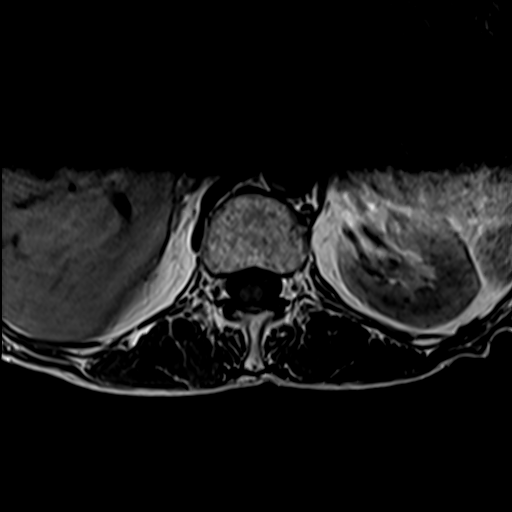

[31 of 48 positions shown; findings below may reference images not displayed]

FINDINGS: Segmentation:  5 lumbar vertebrae.  Rudimentary ribs at T12.

Alignment:  Slight lumbar levoscoliosis.  No significant listhesis.

Vertebrae: No fracture or suspicious marrow lesion. Mild
degenerative endplate edema at L4-5.

Conus medullaris and cauda equina: Conus extends to the L1 level.
Conus and cauda equina appear normal.

Paraspinal and other soft tissues: Unremarkable.

Disc levels:

Disc desiccation from L2-3 to L5-S1 with up to mild disc space
narrowing, greatest at L4-5.

T12-L1 and L1-2: Negative.

L2-3: Mild disc bulging, new right and larger left subarticular to
foraminal disc protrusions, and mild facet hypertrophy result in new
mild left neural foraminal stenosis with disc contacting the left L2
nerve root near the lateral aspect of the foramen. No spinal
stenosis.

L3-4: Disc bulging and mild facet hypertrophy result in mild left
neural foraminal stenosis without spinal stenosis, unchanged.

L4-5: Interval posterior decompression. Right eccentric disc
bulging, a right paracentral to subarticular disc protrusion, a new
right foraminal disc extrusion, and moderate facet hypertrophy
result in moderate bilateral lateral recess stenosis and severe
right neural foraminal stenosis with right L4 nerve root impingement
in the neural foramen. No significant generalized spinal stenosis.

L5-S1: Small central disc protrusion and mild left facet hypertrophy
without stenosis, unchanged.
IMPRESSION: 1. Interval posterior decompression at L4-5. New right foraminal
disc extrusion with severe foraminal stenosis and L4 nerve root
impingement.
2. New/larger biforaminal disc protrusions at L2-3 with mild left
foraminal stenosis.

## 2023-03-25 DIAGNOSIS — M25512 Pain in left shoulder: Secondary | ICD-10-CM | POA: Diagnosis not present

## 2023-03-25 DIAGNOSIS — M25412 Effusion, left shoulder: Secondary | ICD-10-CM | POA: Diagnosis not present

## 2023-03-28 ENCOUNTER — Other Ambulatory Visit: Payer: Self-pay | Admitting: Internal Medicine

## 2023-04-01 ENCOUNTER — Other Ambulatory Visit: Payer: Self-pay | Admitting: Internal Medicine

## 2023-04-01 DIAGNOSIS — M25412 Effusion, left shoulder: Secondary | ICD-10-CM | POA: Diagnosis not present

## 2023-04-01 DIAGNOSIS — M25512 Pain in left shoulder: Secondary | ICD-10-CM | POA: Diagnosis not present

## 2023-04-01 NOTE — Telephone Encounter (Signed)
LOV: 11/15/2022   NOV: N/A

## 2023-04-02 ENCOUNTER — Other Ambulatory Visit: Payer: Self-pay | Admitting: Internal Medicine

## 2023-04-02 NOTE — Telephone Encounter (Signed)
Refilled on 04/01/2023. Please refuse duplicate request.

## 2023-04-08 DIAGNOSIS — M25512 Pain in left shoulder: Secondary | ICD-10-CM | POA: Diagnosis not present

## 2023-04-08 DIAGNOSIS — M25412 Effusion, left shoulder: Secondary | ICD-10-CM | POA: Diagnosis not present

## 2023-04-15 DIAGNOSIS — M25512 Pain in left shoulder: Secondary | ICD-10-CM | POA: Diagnosis not present

## 2023-04-15 DIAGNOSIS — M25412 Effusion, left shoulder: Secondary | ICD-10-CM | POA: Diagnosis not present

## 2023-04-22 ENCOUNTER — Other Ambulatory Visit: Payer: Self-pay | Admitting: Internal Medicine

## 2023-04-29 DIAGNOSIS — M25512 Pain in left shoulder: Secondary | ICD-10-CM | POA: Diagnosis not present

## 2023-04-29 DIAGNOSIS — M25412 Effusion, left shoulder: Secondary | ICD-10-CM | POA: Diagnosis not present

## 2023-05-03 ENCOUNTER — Other Ambulatory Visit: Payer: Self-pay | Admitting: Internal Medicine

## 2023-05-05 NOTE — Telephone Encounter (Signed)
Requesting: Clonazepam Contract: No UDS: NO Last Visit: 11/15/2022 Next Visit: 06/03/2023 Last Refill: 04/01/2023  Please Advise

## 2023-05-05 NOTE — Telephone Encounter (Signed)
Prescription Request  05/05/2023  LOV: 11/15/2022  What is the name of the medication or equipment? [email or web address]   Have you contacted your pharmacy to request a refill? Yes   Which pharmacy would you like this sent to?  [facility] [place], [place] - [address] [place] [address] Phone: [phone] Fax: [phone]    Patient notified that their request is being sent to the clinical staff for review and that they should receive a response within 2 business days.   Please advise at Mobile [phone] (mobile)

## 2023-05-06 ENCOUNTER — Other Ambulatory Visit: Payer: Self-pay | Admitting: Internal Medicine

## 2023-05-13 DIAGNOSIS — M25612 Stiffness of left shoulder, not elsewhere classified: Secondary | ICD-10-CM | POA: Diagnosis not present

## 2023-05-18 IMAGING — RF DG LUMBAR SPINE 2-3V
1 series · 2 of 2 positions shown · non-contrast
Comparison: None.

CLINICAL DATA: Minimally invasive L4-L5 discectomy.

EXAM:
LUMBAR SPINE - 2-3 VIEW

[Series 1: dg x-ray · 0.20mm/px · 2 of 2 slices shown]
[im 1/2]
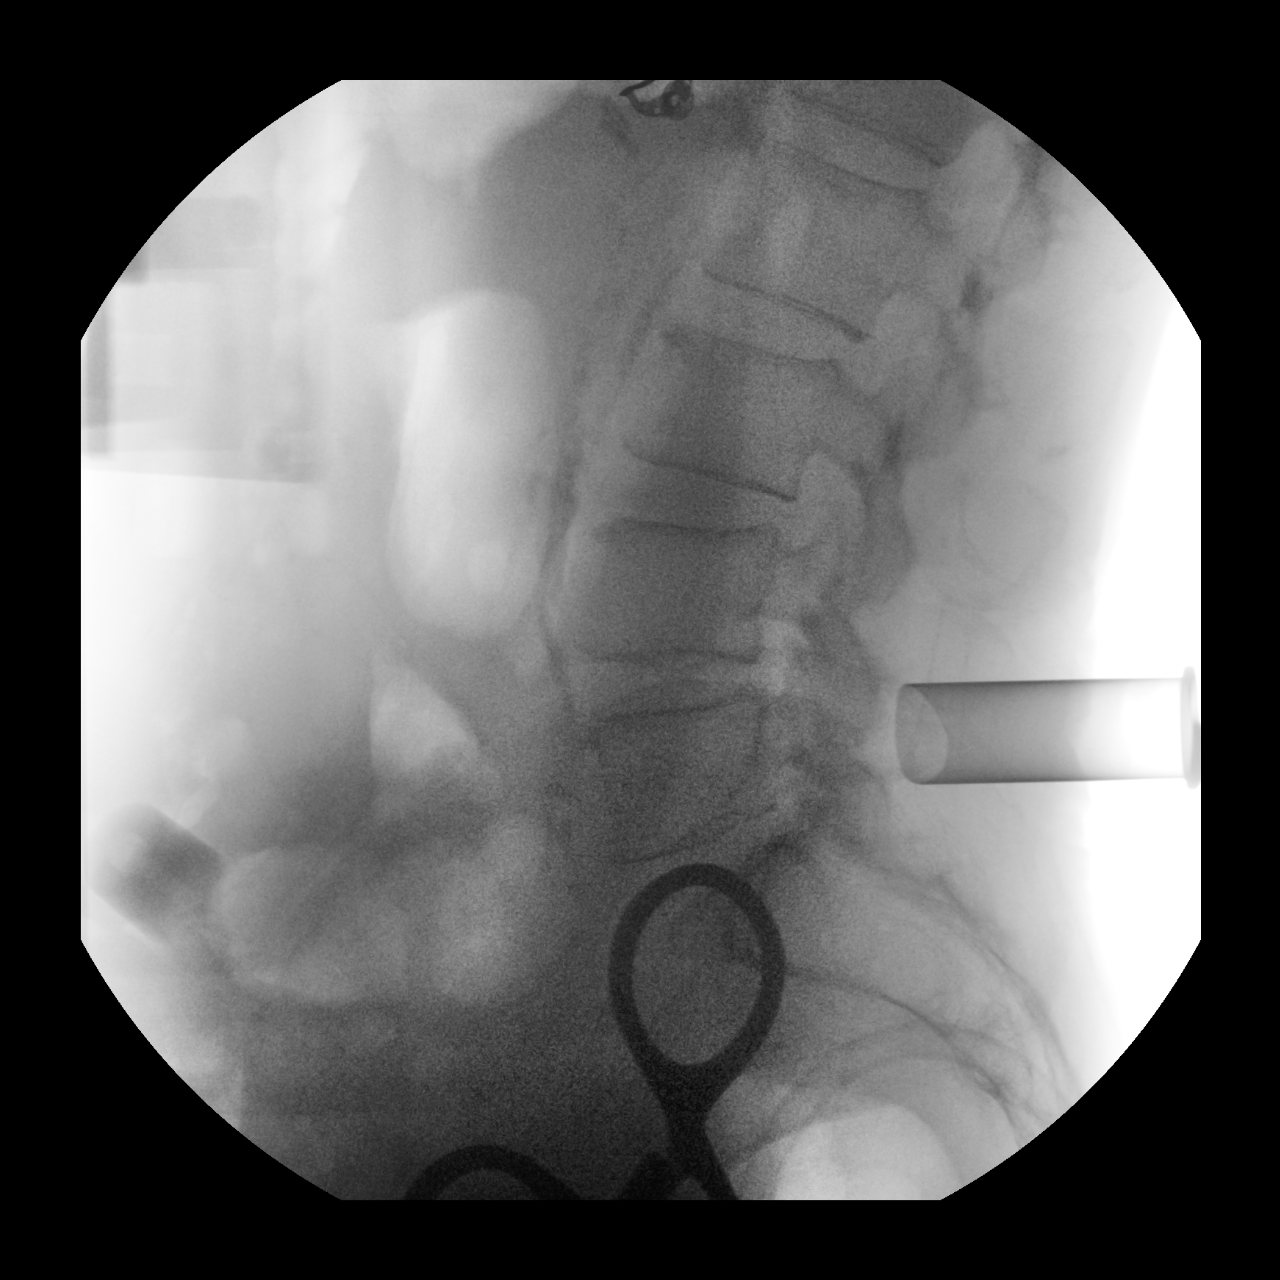
[im 2/2]
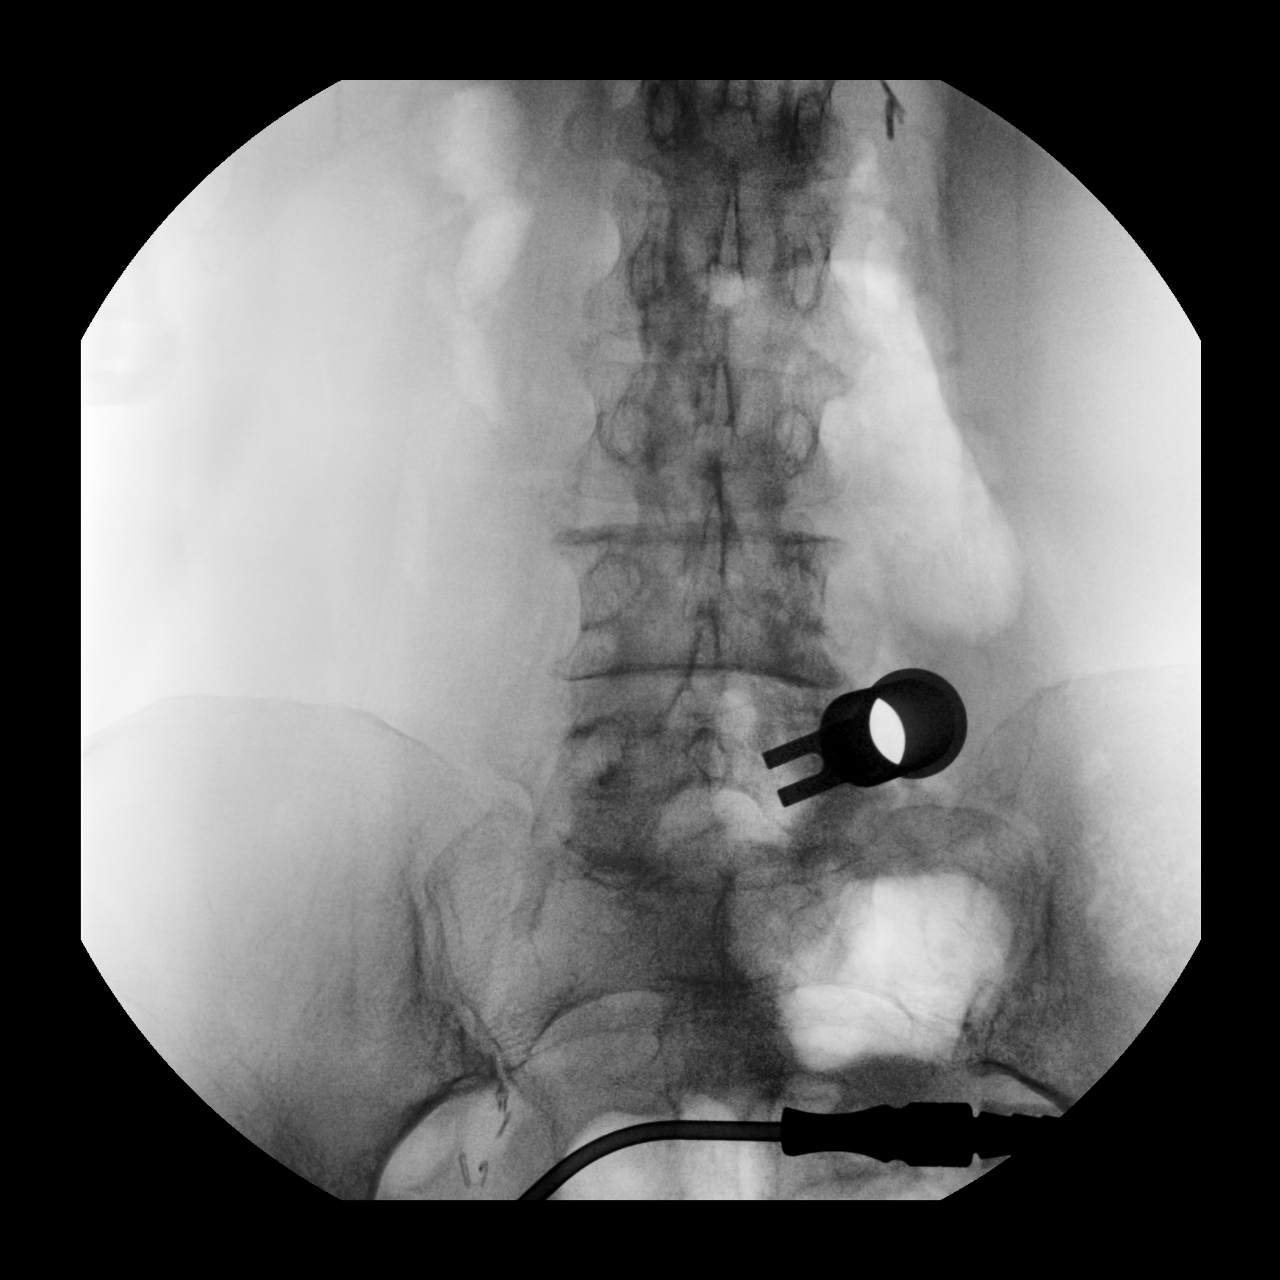

[2 of 2 positions shown; findings below may reference images not displayed]

FINDINGS: Two intraoperative fluoroscopic images are provided. Surgical
hardware overlies the L5 vertebral body.
IMPRESSION: Surgical hardware overlying the L5 vertebral body.

## 2023-05-19 DIAGNOSIS — M25612 Stiffness of left shoulder, not elsewhere classified: Secondary | ICD-10-CM | POA: Diagnosis not present

## 2023-05-27 DIAGNOSIS — M25612 Stiffness of left shoulder, not elsewhere classified: Secondary | ICD-10-CM | POA: Diagnosis not present

## 2023-06-02 DIAGNOSIS — M7542 Impingement syndrome of left shoulder: Secondary | ICD-10-CM | POA: Diagnosis not present

## 2023-06-02 DIAGNOSIS — M25512 Pain in left shoulder: Secondary | ICD-10-CM | POA: Diagnosis not present

## 2023-06-02 DIAGNOSIS — G8929 Other chronic pain: Secondary | ICD-10-CM | POA: Diagnosis not present

## 2023-06-02 DIAGNOSIS — M7552 Bursitis of left shoulder: Secondary | ICD-10-CM | POA: Diagnosis not present

## 2023-06-02 DIAGNOSIS — M75122 Complete rotator cuff tear or rupture of left shoulder, not specified as traumatic: Secondary | ICD-10-CM | POA: Diagnosis not present

## 2023-06-03 ENCOUNTER — Ambulatory Visit: Payer: Medicare Other | Admitting: Internal Medicine

## 2023-06-03 ENCOUNTER — Encounter: Payer: Self-pay | Admitting: Internal Medicine

## 2023-06-03 VITALS — BP 110/56 | HR 77 | Temp 98.0°F | Ht 63.0 in | Wt 89.4 lb

## 2023-06-03 DIAGNOSIS — T380X5A Adverse effect of glucocorticoids and synthetic analogues, initial encounter: Secondary | ICD-10-CM

## 2023-06-03 DIAGNOSIS — R7303 Prediabetes: Secondary | ICD-10-CM | POA: Diagnosis not present

## 2023-06-03 DIAGNOSIS — K50912 Crohn's disease, unspecified, with intestinal obstruction: Secondary | ICD-10-CM | POA: Diagnosis not present

## 2023-06-03 DIAGNOSIS — F5105 Insomnia due to other mental disorder: Secondary | ICD-10-CM

## 2023-06-03 DIAGNOSIS — Z720 Tobacco use: Secondary | ICD-10-CM

## 2023-06-03 DIAGNOSIS — R636 Underweight: Secondary | ICD-10-CM | POA: Diagnosis not present

## 2023-06-03 DIAGNOSIS — F409 Phobic anxiety disorder, unspecified: Secondary | ICD-10-CM

## 2023-06-03 DIAGNOSIS — E559 Vitamin D deficiency, unspecified: Secondary | ICD-10-CM

## 2023-06-03 DIAGNOSIS — E538 Deficiency of other specified B group vitamins: Secondary | ICD-10-CM | POA: Diagnosis not present

## 2023-06-03 DIAGNOSIS — I2584 Coronary atherosclerosis due to calcified coronary lesion: Secondary | ICD-10-CM

## 2023-06-03 DIAGNOSIS — E441 Mild protein-calorie malnutrition: Secondary | ICD-10-CM

## 2023-06-03 DIAGNOSIS — Z5320 Procedure and treatment not carried out because of patient's decision for unspecified reasons: Secondary | ICD-10-CM

## 2023-06-03 DIAGNOSIS — F411 Generalized anxiety disorder: Secondary | ICD-10-CM

## 2023-06-03 DIAGNOSIS — M818 Other osteoporosis without current pathological fracture: Secondary | ICD-10-CM

## 2023-06-03 DIAGNOSIS — I2510 Atherosclerotic heart disease of native coronary artery without angina pectoris: Secondary | ICD-10-CM

## 2023-06-03 LAB — COMPREHENSIVE METABOLIC PANEL
ALT: 18 U/L (ref 0–35)
AST: 21 U/L (ref 0–37)
Albumin: 4 g/dL (ref 3.5–5.2)
Alkaline Phosphatase: 40 U/L (ref 39–117)
BUN: 10 mg/dL (ref 6–23)
CO2: 24 mEq/L (ref 19–32)
Calcium: 9 mg/dL (ref 8.4–10.5)
Chloride: 112 mEq/L (ref 96–112)
Creatinine, Ser: 0.94 mg/dL (ref 0.40–1.20)
GFR: 59.33 mL/min — ABNORMAL LOW (ref >60.00)
Glucose, Bld: 65 mg/dL — ABNORMAL LOW (ref 70–99)
Potassium: 3.8 mEq/L (ref 3.5–5.1)
Sodium: 142 mEq/L (ref 135–145)
Total Bilirubin: 0.4 mg/dL (ref 0.2–1.2)
Total Protein: 5.7 g/dL — ABNORMAL LOW (ref 6.0–8.3)

## 2023-06-03 LAB — B12 AND FOLATE PANEL
Folate: >23.8 ng/mL (ref >5.9)
Vitamin B-12: 649 pg/mL (ref 211–911)

## 2023-06-03 LAB — HEMOGLOBIN A1C: Hgb A1c MFr Bld: 6.1 % (ref 4.6–6.5)

## 2023-06-03 LAB — MAGNESIUM: Magnesium: 1.8 mg/dL (ref 1.5–2.5)

## 2023-06-03 LAB — VITAMIN D 25 HYDROXY (VIT D DEFICIENCY, FRACTURES): VITD: 57.9 ng/mL (ref 30.00–100.00)

## 2023-06-03 NOTE — Patient Instructions (Signed)
You need 1200 mg calcium daily through diet and supplements .  Try chewable calcium (Viactiv)   Referral to the lung cancer screening  clinic   Try  a wedge pillow to prevent nighttime reflux

## 2023-06-03 NOTE — Assessment & Plan Note (Signed)
Her disease is managed by Dr Alice Reichert,  Last colonoscopy done in 2021. Her current pain complaint is atypical for Crohn's flare.

## 2023-06-03 NOTE — Assessment & Plan Note (Signed)
She has been taking sertraline for years,  Now taking 100 mg doaily aong with Pamelor for insomnia.  Use of clonazepam reviewed.  The risks and benefits of  Chronic  benzodiazepine use were discussed with patient today including increased risk of dementia,  Addiction, and seizures if abruptly withdrawn  .  Patient was encouraged to reduce use of clonazepam gradually,  Starting with reduction of one dose to 1/2 tablet and use buspirone if needed  .  Refill history confirmed via [place] Controlled Substance databas, accessed by me today.. her risk of uninentional overdose is low at 030 and last refill of clonazepam was June 25.  Last opioid refill from [facility]) was May 7

## 2023-06-03 NOTE — Assessment & Plan Note (Addendum)
Managed by to [facility] , last Reclast infusion in Feb 2024  without side effects.  Not taking any calcium supplements.  Taking vitamin D. Prescried weekly by [facility].    BMD is increasing by 2022 DEXA

## 2023-06-03 NOTE — Assessment & Plan Note (Signed)
She refuses to regard her low body weight as pathologic and does not want to gain weight.  She is not bulemic or anorexic.

## 2023-06-03 NOTE — Assessment & Plan Note (Signed)
Secondary to Crohn's of the small intestine. She has self administering monthly injections

## 2023-06-03 NOTE — Assessment & Plan Note (Addendum)
Managed with stable doses of  clonazepam and trazodone.   No medication changes today Refill history confirmed via [place] Controlled Substance databas, accessed by me today.Marland Kitchen

## 2023-06-03 NOTE — Assessment & Plan Note (Signed)
She has been counselled about smoking and has no intention of quitting.  Continue statin

## 2023-06-03 NOTE — Assessment & Plan Note (Addendum)
She has been Taking a mega dose since Feb for level of 13 ([facility])

## 2023-06-03 NOTE — Progress Notes (Signed)
Subjective:  Patient ID: Tamara [name], female    DOB: 04-20-1946  Age: 77 y.o. MRN: [id number]  CC: The primary encounter diagnosis was Generalized anxiety disorder. Diagnoses of Mammogram declined, Tobacco abuse disorder, Steroid-induced osteoporosis, Vitamin D deficiency, Mild protein-calorie malnutrition (HCC), Prediabetes, B12 deficiency, Coronary atherosclerosis due to calcified coronary lesion, Crohn's disease of intestine, with intestinal obstruction (HCC), Insomnia due to anxiety and fear, and Underweight were also pertinent to this visit.   HPI AURIEL KIST presents for  Chief Complaint  Patient presents with   Medical Management of Chronic Issues    6 month follow up     1) GAD: managed with sertraline and clonazepam .  Taking clonazepam at night along with pamelor.   2) chronic shoulder pain:  now 3 months status post an extensive arthroscopic debridement, arthroscopic subacromial decompression, and mini-open rotator cuff repair of her left shoulder by Dr. Joice Lofts .Marland Kitchen  Her last visit with him was yesterday .  Biceps muscle was not able to be reattached so she still has a bulge.   Caused by a History of fall  over 3 months ago resulting in left shoulder injury, biceps tear, etc occurred after standing up too fast . Passed out on the way to the kitchen  3) Crohn's : last colonoscopy Nov 021.  5 yr follow up advised .  Taking PPI for GERD  , no Baretts; on last EGD   4) HLD:  taking Crestor without side effects   5)  Underweight,  has gained 4 lb since her surgery.  Reviewed diet.  Eats 3 meals daily .  Reviewed diet, which is notably lacking in fruits and vegetables and dairy   Outpatient Medications Prior to Visit  Medication Sig Dispense Refill   albuterol (VENTOLIN HFA) 108 (90 Base) MCG/ACT inhaler Inhale 2 puffs into the lungs every 4 (four) hours as needed. 18 g 0   clonazePAM (KLONOPIN) 0.5 MG tablet TAKE 1 TABLET BY MOUTH ONCE DAILY AS NEEDED FOR ANXIETY 30 tablet  0   cyanocobalamin (VITAMIN B12) 1000 MCG/ML injection USE 1 ML INTO MUSCLE EVERY 30 DAY AS DIRECTED 10 mL 2   nortriptyline (PAMELOR) 25 MG capsule TAKE 1 CAPSULE BY MOUTH AT BEDTIME 90 capsule 0   omeprazole (PRILOSEC) 40 MG capsule TAKE 1 CAPSULE BY MOUTH TWICE DAILY 180 capsule 1   ondansetron (ZOFRAN-ODT) 4 MG disintegrating tablet Take 4 mg by mouth every 8 (eight) hours as needed.     rosuvastatin (CRESTOR) 10 MG tablet TAKE 1 TABLET BY MOUTH AT BEDTIME 90 tablet 1   sertraline (ZOLOFT) 100 MG tablet TAKE 1 TABLET BY MOUTH ONCE DAILY 90 tablet 1   Spacer/Aero-Holding Chambers (AEROCHAMBER MV) inhaler Use as instructed 1 each 2   traZODone (DESYREL) 100 MG tablet TAKE 1 TABLET BY MOUTH AT BEDTIME 90 tablet 0   HYDROcodone-acetaminophen (NORCO/VICODIN) 5-325 MG tablet Take 1 tablet by mouth every 6 (six) hours as needed for moderate pain. (Patient not taking: Reported on 06/03/2023)     No facility-administered medications prior to visit.    Review of Systems;  Patient denies headache, fevers, malaise, unintentional weight loss, skin rash, eye pain, sinus congestion and sinus pain, sore throat, dysphagia,  hemoptysis , cough, dyspnea, wheezing, chest pain, palpitations, orthopnea, edema, abdominal pain, nausea, melena, diarrhea, constipation, flank pain, dysuria, hematuria, urinary  Frequency, nocturia, numbness, tingling, seizures,  Focal weakness, Loss of consciousness,  Tremor, insomnia, depression, anxiety, and suicidal ideation.  Objective:  BP (!) 110/56   Pulse 77   Temp 98 F (36.7 C) (Oral)   Ht 5\' 3"  (1.6 m)   Wt 89 lb 6.4 oz (40.6 kg)   SpO2 99%   BMI 15.84 kg/m   BP Readings from Last 3 Encounters:  06/03/23 (!) 110/56  03/04/23 120/67  01/28/23 (!) 112/58    Wt Readings from Last 3 Encounters:  06/03/23 89 lb 6.4 oz (40.6 kg)  03/04/23 90 lb (40.8 kg)  01/28/23 93 lb (42.2 kg)    Physical Exam Vitals reviewed.  Constitutional:      General: She is  not in acute distress.    Appearance: Normal appearance. She is normal weight. She is not ill-appearing, toxic-appearing or diaphoretic.  HENT:     Head: Normocephalic.  Eyes:     General: No scleral icterus.       Right eye: No discharge.        Left eye: No discharge.     Conjunctiva/sclera: Conjunctivae normal.  Cardiovascular:     Rate and Rhythm: Normal rate and regular rhythm.     Heart sounds: Normal heart sounds.  Pulmonary:     Effort: Pulmonary effort is normal. No respiratory distress.     Breath sounds: Normal breath sounds.  Musculoskeletal:        General: Normal range of motion.  Skin:    General: Skin is warm and dry.  Neurological:     General: No focal deficit present.     Mental Status: She is alert and oriented to person, place, and time. Mental status is at baseline.  Psychiatric:        Mood and Affect: Mood normal.        Behavior: Behavior normal.        Thought Content: Thought content normal.        Judgment: Judgment normal.     Lab Results  Component Value Date   HGBA1C 6.1 06/03/2023   HGBA1C 6.3 09/12/2022   HGBA1C 6.1 09/21/2020    Lab Results  Component Value Date   CREATININE 0.94 06/03/2023   CREATININE 0.74 11/15/2022   CREATININE 0.74 09/12/2022    Lab Results  Component Value Date   WBC 8.5 11/15/2022   HGB 12.9 11/15/2022   HCT 39.0 11/15/2022   PLT 298.0 11/15/2022   GLUCOSE 65 (L) 06/03/2023   CHOL 131 09/12/2022   TRIG 110.0 09/12/2022   HDL 65.60 09/12/2022   LDLDIRECT 43.0 09/12/2022   LDLCALC 43 09/12/2022   ALT 18 06/03/2023   AST 21 06/03/2023   NA 142 06/03/2023   K 3.8 06/03/2023   CL 112 06/03/2023   CREATININE 0.94 06/03/2023   BUN 10 06/03/2023   CO2 24 06/03/2023   TSH 1.32 09/12/2022   INR 1.0 04/19/2022   HGBA1C 6.1 06/03/2023    [place] OR NERVE BLOCK-IMAGE ONLY [facility])  Result Date: 03/04/2023 There is no interpretation for this exam.  This order is for images obtained during a surgical procedure.   Please See "Surgeries" Tab for more information regarding the procedure.    Assessment & Plan:  .Generalized anxiety disorder Assessment & Plan: She has been taking sertraline for years,  Now taking 100 mg doaily aong with Pamelor for insomnia.  Use of clonazepam reviewed.  The risks and benefits of  Chronic  benzodiazepine use were discussed with patient today including increased risk of dementia,  Addiction, and seizures if abruptly withdrawn  .  Patient was  encouraged to reduce use of clonazepam gradually,  Starting with reduction of one dose to 1/2 tablet and use buspirone if needed  .  Refill history confirmed via [place] Controlled Substance databas, accessed by me today.. her risk of uninentional overdose is low at 030 and last refill of clonazepam was June 25.  Last opioid refill from [facility]) was May 7    Mammogram declined  Tobacco abuse disorder -     Ambulatory referral to Pulmonology  Steroid-induced osteoporosis Assessment & Plan: Managed by to [facility] , last Reclast infusion in Feb 2024  without side effects.  Not taking any calcium supplements.  Taking vitamin D. Prescried weekly by [facility].    BMD is increasing by 2022 DEXA    Vitamin D deficiency Assessment & Plan: She has been Taking a mega dose since Feb for level of 13 ([facility])  Orders: -     VITAMIN D 25 Hydroxy (Vit-D Deficiency, Fractures)  Mild protein-calorie malnutrition (HCC) -     Comprehensive metabolic panel -     B12 and Folate Panel -     Magnesium  Prediabetes -     Hemoglobin A1c -     Lipid Panel w/reflex Direct LDL  B12 deficiency Assessment & Plan: Secondary to Crohn's of the small intestine. She has self administering monthly injections     Coronary atherosclerosis due to calcified coronary lesion Assessment & Plan: She has been counselled about smoking and has no intention of quitting.  Continue statin    Crohn's disease of intestine, with intestinal obstruction  [facility]) Assessment & Plan: Her disease is managed by Dr Norma Fredrickson,  Last colonoscopy done in 2021. Her current pain complaint is atypical for Crohn's flare.   Insomnia due to anxiety and fear Assessment & Plan: Managed with stable doses of  clonazepam and trazodone.   No medication changes today Refill history confirmed via [place] Controlled Substance databas, accessed by me today..    Underweight Assessment & Plan: She refuses to regard her low body weight as pathologic and does not want to gain weight.  She is not bulemic or anorexic.     Follow-up: No follow-ups on file.   Sherlene Shams, MD

## 2023-06-04 LAB — LIPID PANEL W/REFLEX DIRECT LDL
Cholesterol: 128 mg/dL (ref <200)
HDL: 72 mg/dL (ref >=50)
LDL Cholesterol (Calc): 31 mg/dL (calc)
Non-HDL Cholesterol (Calc): 56 mg/dL (calc) (ref <130)
Total CHOL/HDL Ratio: 1.8 (calc) (ref <5.0)
Triglycerides: 174 mg/dL — ABNORMAL HIGH (ref <150)

## 2023-06-05 ENCOUNTER — Encounter: Payer: Self-pay | Admitting: Internal Medicine

## 2023-06-05 ENCOUNTER — Other Ambulatory Visit: Payer: Self-pay | Admitting: Internal Medicine

## 2023-06-05 MED ORDER — CLONAZEPAM 0.5 MG PO TABS: 0.5000 mg | ORAL_TABLET | Freq: Every day | ORAL | 5 refills | Status: DC | PRN

## 2023-06-05 NOTE — Telephone Encounter (Signed)
Refilled: 05/05/2023 Last OV: 06/03/2023 Next OV: not scheduled

## 2023-06-05 NOTE — Telephone Encounter (Signed)
DIDNT RECEIVE RX FOR JULY PLEASE RESEND NEW RX THANK YOU   LOV 06/03/23  NOV: N/A

## 2023-06-10 ENCOUNTER — Ambulatory Visit: Payer: Self-pay

## 2023-06-10 NOTE — Patient Outreach (Signed)
  Care Coordination   Initial Visit Note   06/10/2023 Name: Tamara [name] MRN: [id number] DOB: August 23, 1946  Tamara [name] is a 77 y.o. year old female who sees Tamara [name], Tamara Daring, MD for primary care. I spoke with  Tamara [name] by phone today.  What matters to the patients health and wellness today?  CM educated patient on [facility] services.  Patient declines services and feels that she is able to manage her medical needs.  Patient agreed to contact her provider in the future if she needs [facility] services.    Goals Addressed   None     SDOH assessments and interventions completed:  No     Care Coordination Interventions:  No, not indicated   Follow up plan: No further intervention required.   Encounter Outcome:  Pt. Refused

## 2023-06-11 ENCOUNTER — Other Ambulatory Visit: Payer: Self-pay | Admitting: Internal Medicine

## 2023-06-17 IMAGING — DX DG FOREARM 2V*R*
2 series · 2 of 2 positions shown · non-contrast
Comparison: None.

CLINICAL DATA: Dog bite to the right upper extremity.

EXAM:
RIGHT FOREARM - 2 VIEW

[forearm ap]
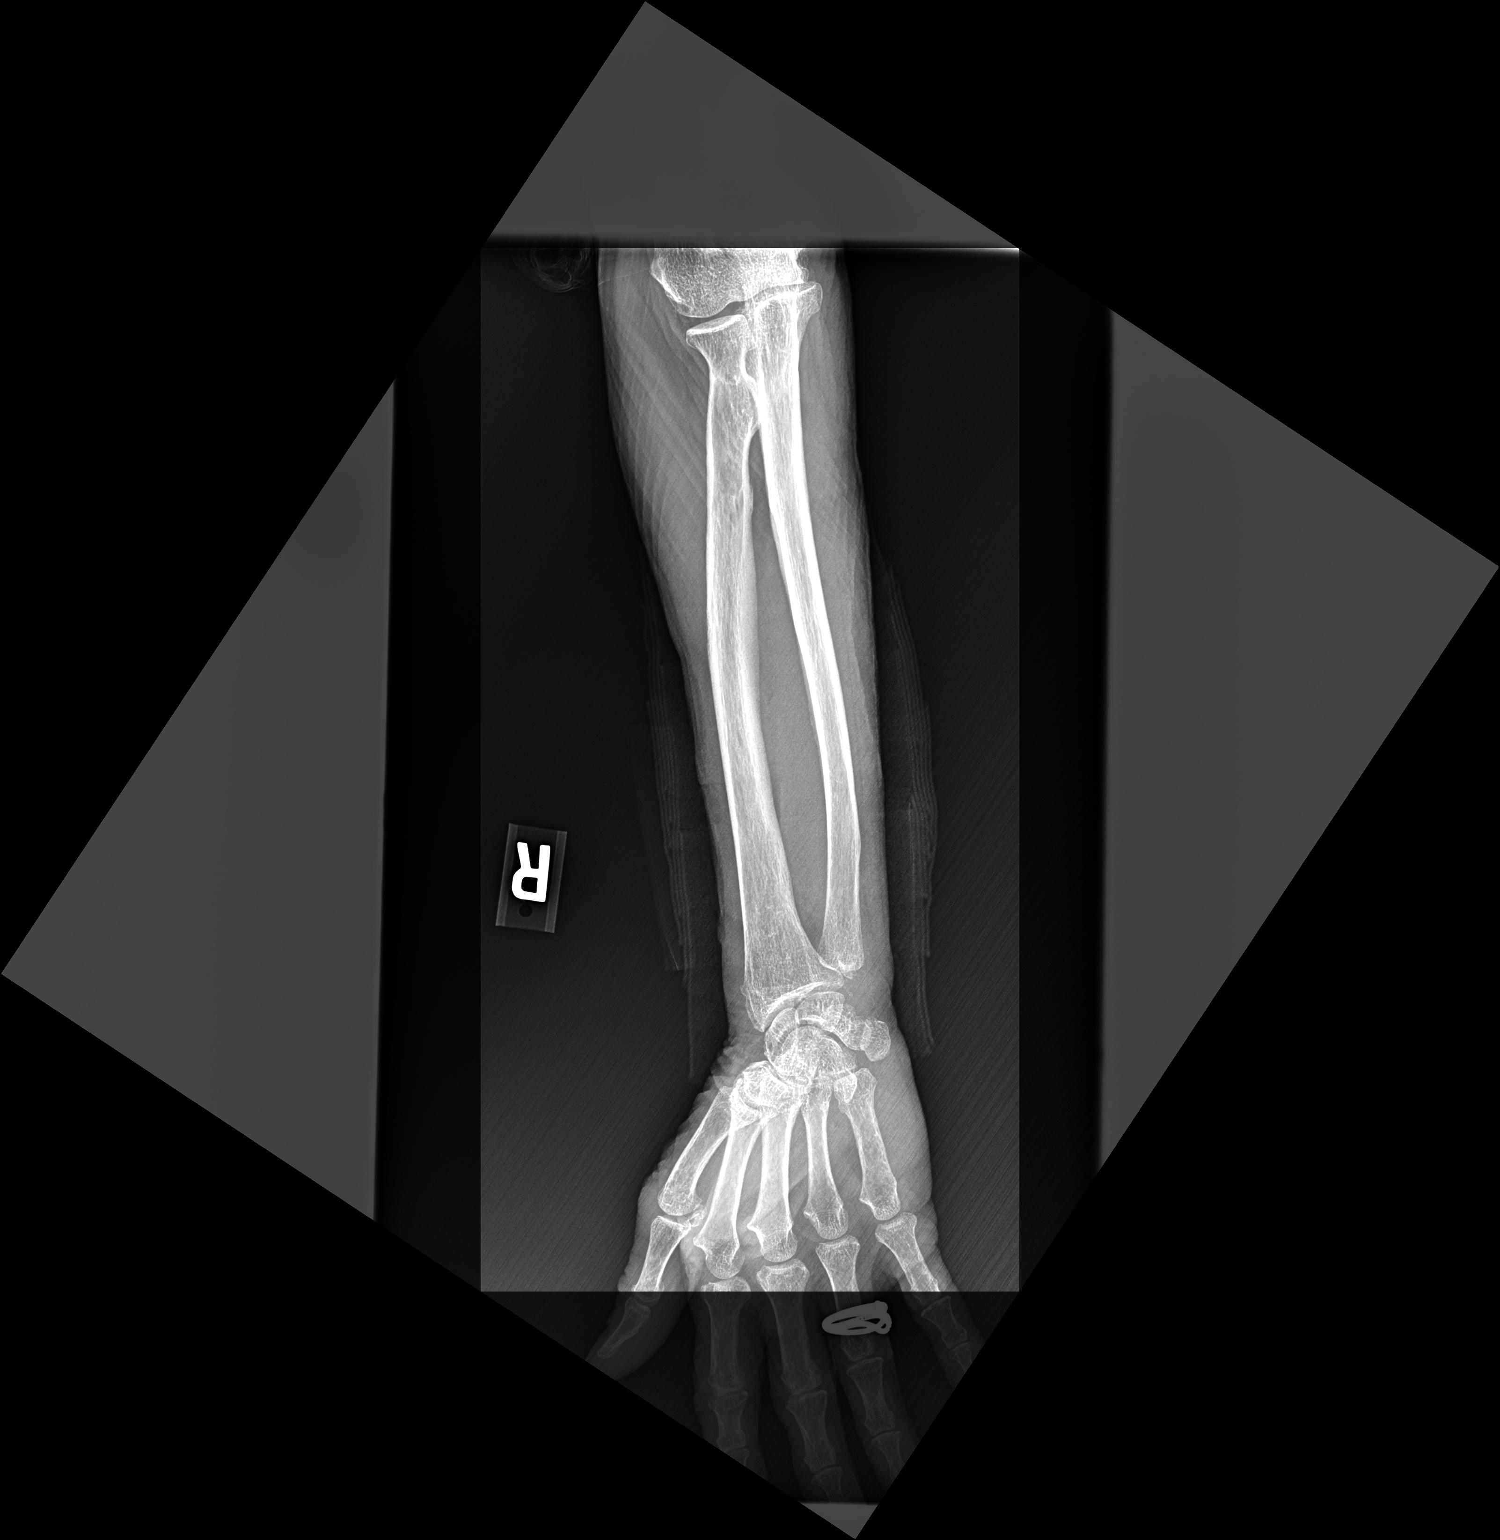

[forearm lat]
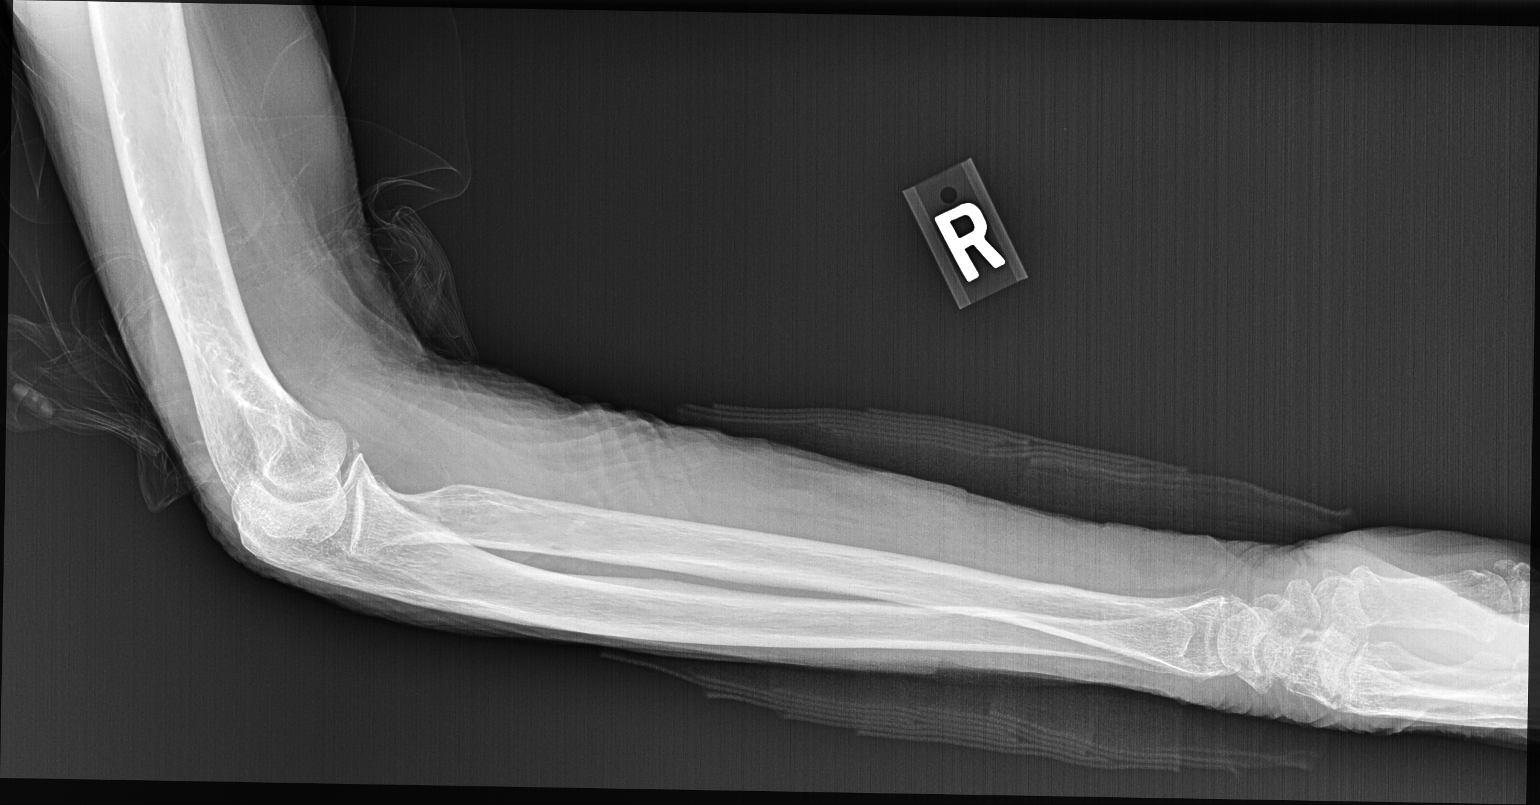

[2 of 2 positions shown; findings below may reference images not displayed]

FINDINGS: There is no acute fracture or dislocation. The bones are osteopenic.
The soft tissues are unremarkable. No effusion object soft tissue
gas.
IMPRESSION: No acute findings.

## 2023-07-04 DIAGNOSIS — S46102D Unspecified injury of muscle, fascia and tendon of long head of biceps, left arm, subsequent encounter: Secondary | ICD-10-CM | POA: Diagnosis not present

## 2023-07-04 DIAGNOSIS — M1712 Unilateral primary osteoarthritis, left knee: Secondary | ICD-10-CM | POA: Diagnosis not present

## 2023-07-04 DIAGNOSIS — M75122 Complete rotator cuff tear or rupture of left shoulder, not specified as traumatic: Secondary | ICD-10-CM | POA: Diagnosis not present

## 2023-07-04 DIAGNOSIS — M24112 Other articular cartilage disorders, left shoulder: Secondary | ICD-10-CM | POA: Diagnosis not present

## 2023-07-08 DIAGNOSIS — L298 Other pruritus: Secondary | ICD-10-CM | POA: Diagnosis not present

## 2023-07-08 DIAGNOSIS — L570 Actinic keratosis: Secondary | ICD-10-CM | POA: Diagnosis not present

## 2023-07-08 DIAGNOSIS — L821 Other seborrheic keratosis: Secondary | ICD-10-CM | POA: Diagnosis not present

## 2023-07-08 DIAGNOSIS — L720 Epidermal cyst: Secondary | ICD-10-CM | POA: Diagnosis not present

## 2023-07-23 ENCOUNTER — Other Ambulatory Visit: Payer: Self-pay | Admitting: Internal Medicine

## 2023-07-25 ENCOUNTER — Encounter: Payer: Self-pay | Admitting: Neurosurgery

## 2023-07-25 NOTE — Telephone Encounter (Signed)
  Media Information  Document Information  I spoke to the patient. She thinks she has a blood clot. She mowed yesterday. At night she had pain in her leg. This morning she woke up with a knot behind her right knee cap that is warm to the touch and has a bluish color. Can you order an [place] today?

## 2023-07-25 NOTE — Telephone Encounter (Signed)
Spoke with patient. Behind her knee is swollen and painful. She's had a DVT in the past and this feels the same.   Advised to go to ED immediately for evaluation of possible DVT.   Told her we would call her later today to schedule f/u appt for her back. She is having more pain.   Please call her after lunch today or on Monday to schedule an appointment for her back.

## 2023-07-25 NOTE — Telephone Encounter (Signed)
No answer left her message that I was following up to see how she was doing. I will call her Monday morning.

## 2023-07-28 ENCOUNTER — Emergency Department: Payer: Medicare Other

## 2023-07-28 ENCOUNTER — Emergency Department
Admission: EM | Admit: 2023-07-28 | Discharge: 2023-07-28 | Disposition: A | Discharge status: Home / Self Care | Payer: Medicare Other | Attending: Emergency Medicine | Admitting: Emergency Medicine

## 2023-07-28 ENCOUNTER — Other Ambulatory Visit: Payer: Self-pay

## 2023-07-28 ENCOUNTER — Encounter: Payer: Self-pay | Admitting: Emergency Medicine

## 2023-07-28 DIAGNOSIS — Z86718 Personal history of other venous thrombosis and embolism: Secondary | ICD-10-CM | POA: Diagnosis not present

## 2023-07-28 DIAGNOSIS — M7121 Synovial cyst of popliteal space [Baker], right knee: Secondary | ICD-10-CM | POA: Diagnosis not present

## 2023-07-28 DIAGNOSIS — M7989 Other specified soft tissue disorders: Secondary | ICD-10-CM | POA: Diagnosis not present

## 2023-07-28 DIAGNOSIS — I82401 Acute embolism and thrombosis of unspecified deep veins of right lower extremity: Secondary | ICD-10-CM | POA: Diagnosis not present

## 2023-07-28 DIAGNOSIS — M79604 Pain in right leg: Secondary | ICD-10-CM | POA: Diagnosis present

## 2023-07-28 DIAGNOSIS — I82511 Chronic embolism and thrombosis of right femoral vein: Secondary | ICD-10-CM | POA: Insufficient documentation

## 2023-07-28 MED ORDER — RIVAROXABAN (XARELTO) VTE STARTER PACK (15 & 20 MG): ORAL_TABLET | ORAL | 0 refills | Status: DC

## 2023-07-28 NOTE — ED Notes (Signed)
See triage note  Presents with some swelling and pain behind right knee /lower leg  Denies any injury

## 2023-07-28 NOTE — Telephone Encounter (Signed)
Looks like the patient went to the ER today.

## 2023-07-28 NOTE — Telephone Encounter (Signed)
ED notes state she has a chronic DVT along with a Baker's cyst. She was started on xarelto and was to follow up with Dr. Wyn Quaker.   If her complaint is right leg pain/knee pain, then she should follow up with ortho.   If she is having back pain, then she should have f/u with [place]. Please schedule if needed.   Thanks!

## 2023-07-28 NOTE — ED Triage Notes (Signed)
Patient to ED via POV for pain behind right knee. States redness and swelling x4 days. Concerned for blood clot hx of same. Not currently taking blood thinners.

## 2023-07-28 NOTE — ED Provider Notes (Signed)
   [facility] Provider Note    Event Date/Time   First MD Initiated Contact with Patient 07/28/23 [phone]     (approximate)   History   Leg Pain   HPI  LARAMIE VANWORMER is a 77 y.o. female with history of DVTs as well as a Baker's cyst presents with complaints of pain behind her right knee for approximately 3 to 4 days now.  No shortness of breath or chest pain or pleurisy.     Physical Exam   Triage Vital Signs: ED Triage Vitals [07/28/23 0853]  Encounter Vitals Group     BP 138/74     Systolic BP Percentile      Diastolic BP Percentile      Pulse Rate 90     Resp 18     Temp 97.6 F (36.4 C)     Temp Source Oral     SpO2 100 %     Weight 40.4 kg (89 lb)     Height 1.6 m (5\' 3" )     Head Circumference      Peak Flow      Pain Score 7     Pain Loc      Pain Education      Exclude from Growth Chart     Most recent vital signs: Vitals:   07/28/23 0853  BP: 138/74  Pulse: 90  Resp: 18  Temp: 97.6 F (36.4 C)  SpO2: 100%     General: Awake, no distress.  CV:  Good peripheral perfusion.  Resp:  Normal effort.  Abd:  No distention.  Other:  Right leg: Warm and well-perfused, no swelling noted, mild tenderness popliteal fossa   ED Results / Procedures / Treatments   Labs (all labs ordered are listed, but only abnormal results are displayed) Labs Reviewed - No data to display   EKG     RADIOLOGY Ultrasound pending    PROCEDURES:  Critical Care performed:   Procedures   MEDICATIONS ORDERED IN ED: Medications - No data to display   IMPRESSION / MDM / ASSESSMENT AND PLAN / ED COURSE  I reviewed the triage vital signs and the nursing notes. Patient's presentation is most consistent with acute presentation with potential threat to life or bodily function.  Patient presents with right leg pain as above, differential includes DVT, Baker's cyst, no evidence of infection, warm and well-perfused  Ultrasound  demonstrates likely chronic nonobstructive DVT but also Baker's cyst.  I suspect Baker's cyst is the cause of her acute pain.  She is not on anticoagulation, does not tolerate Eliquis, will start Xarelto, follow-up with Dr. Wyn Quaker of vascular surgery who she has seen before, as well as Dr. Trinda Pascal of orthopedics      FINAL CLINICAL IMPRESSION(S) / ED DIAGNOSES   Final diagnoses:  Chronic deep vein thrombosis (DVT) of femoral vein of right lower extremity (HCC)  Baker's cyst of knee, right     Rx / DC Orders   ED Discharge Orders          Ordered    RIVAROXABAN (XARELTO) VTE STARTER PACK (15 & 20 MG)        07/28/23 1026             Note:  This document was prepared using Dragon voice recognition software and may include unintentional dictation errors.   Jene Every, MD 07/28/23 1050

## 2023-07-30 NOTE — Telephone Encounter (Signed)
Left message to call back  

## 2023-07-30 NOTE — Telephone Encounter (Signed)
She declined appt for her back at this time. She wants to take care of the bakers cyst first. She will call if she needs to be seen for her back pain.

## 2023-08-01 DIAGNOSIS — M11261 Other chondrocalcinosis, right knee: Secondary | ICD-10-CM | POA: Diagnosis not present

## 2023-08-01 DIAGNOSIS — M7121 Synovial cyst of popliteal space [Baker], right knee: Secondary | ICD-10-CM | POA: Diagnosis not present

## 2023-08-01 DIAGNOSIS — M1711 Unilateral primary osteoarthritis, right knee: Secondary | ICD-10-CM | POA: Diagnosis not present

## 2023-08-01 DIAGNOSIS — G8929 Other chronic pain: Secondary | ICD-10-CM | POA: Diagnosis not present

## 2023-08-01 DIAGNOSIS — M25561 Pain in right knee: Secondary | ICD-10-CM | POA: Diagnosis not present

## 2023-08-07 ENCOUNTER — Telehealth: Payer: Self-pay | Admitting: Internal Medicine

## 2023-08-07 DIAGNOSIS — M818 Other osteoporosis without current pathological fracture: Secondary | ICD-10-CM | POA: Diagnosis not present

## 2023-08-07 DIAGNOSIS — K5090 Crohn's disease, unspecified, without complications: Secondary | ICD-10-CM | POA: Diagnosis not present

## 2023-08-07 DIAGNOSIS — K219 Gastro-esophageal reflux disease without esophagitis: Secondary | ICD-10-CM | POA: Diagnosis not present

## 2023-08-07 DIAGNOSIS — E46 Unspecified protein-calorie malnutrition: Secondary | ICD-10-CM | POA: Diagnosis not present

## 2023-08-07 DIAGNOSIS — R1319 Other dysphagia: Secondary | ICD-10-CM | POA: Diagnosis not present

## 2023-08-07 DIAGNOSIS — T380X5A Adverse effect of glucocorticoids and synthetic analogues, initial encounter: Secondary | ICD-10-CM | POA: Diagnosis not present

## 2023-08-07 DIAGNOSIS — R110 Nausea: Secondary | ICD-10-CM | POA: Diagnosis not present

## 2023-08-07 NOTE — Telephone Encounter (Signed)
Copied from CRM [id number]. Topic: Medicare AWV >> Aug 07, 2023 11:50 AM Payton Doughty wrote: Reason for CRM: Called LM 08/07/2023 to schedule AWV   Verlee Rossetti; Care Guide Ambulatory Clinical Support [place] [facility] Direct Dial: [phone]

## 2023-08-11 ENCOUNTER — Encounter: Payer: Self-pay | Admitting: Emergency Medicine

## 2023-08-17 IMAGING — MR MR LUMBAR SPINE W/O CM
5 series · 31 of 48 positions shown · non-contrast
Comparison: CT same day.  MRI 09/07/2021.

CLINICAL DATA: Lumbar surgery Monday October, 2021. Fell 2-3 days ago
with right sided pain

EXAM:
MRI LUMBAR SPINE WITHOUT CONTRAST
TECHNIQUE: Multiplanar, multisequence MR imaging of the lumbar spine was
performed. No intravenous contrast was administered.

[Series 5: T2 · sagittal · 4.0mm · 0.81mm/px · 6 of 17 slices shown (1 of 2)]
[im 1/17]
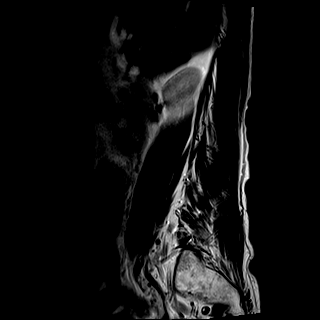
[im 4/17]
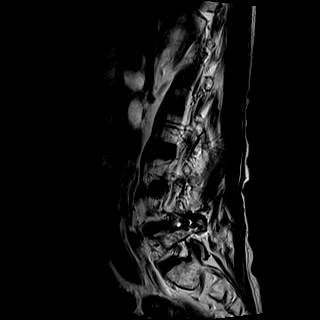
[im 7/17]
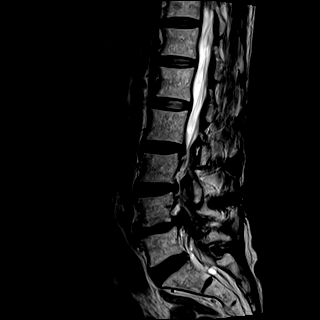
[im 10/17]
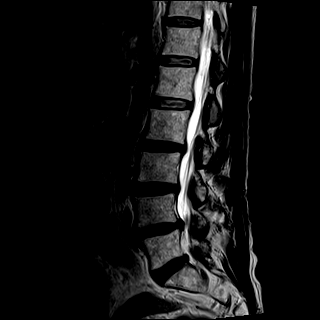
[im 13/17]
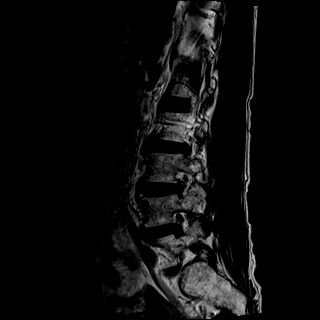
[im 17/17]
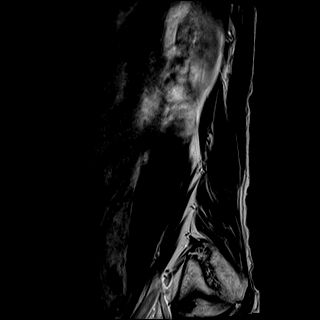

[Series 6: T1 · sagittal · 4.0mm · 0.81mm/px · 7 of 17 slices shown (1 of 2)]
[im 1/17]
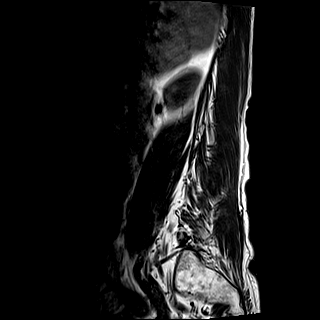
[im 3/17]
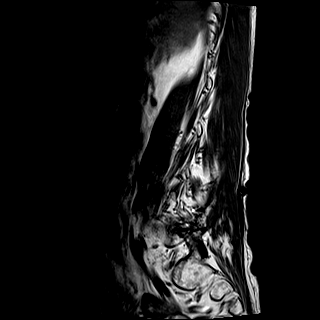
[im 6/17]
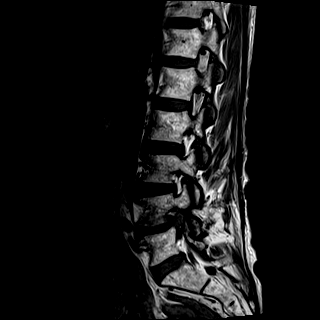
[im 9/17]
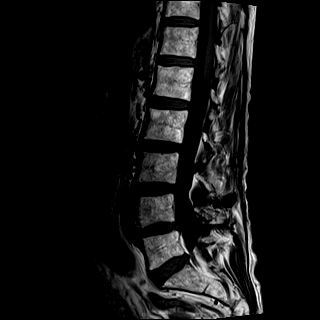
[im 11/17]
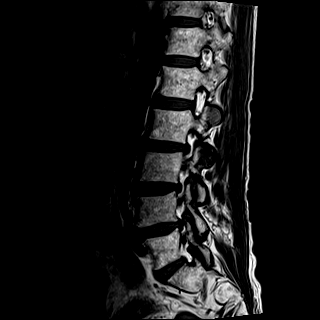
[im 14/17]
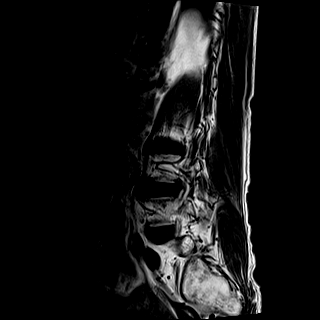
[im 17/17]
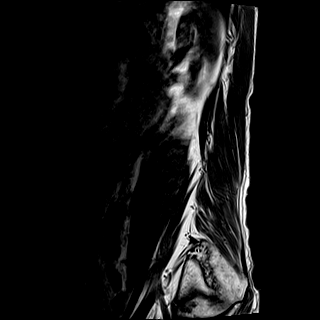

[Series 7: STIR · sagittal · 4.0mm · 0.41mm/px · 2 of 17 slices shown]
[im 1/17]
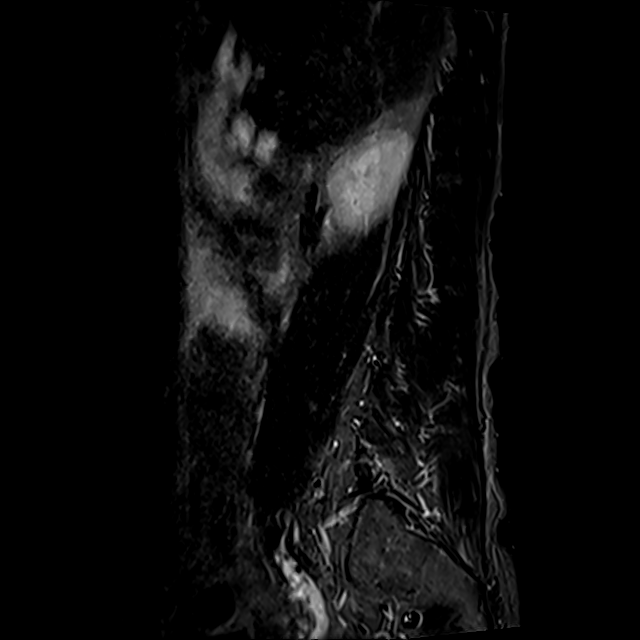
[im 3/17]
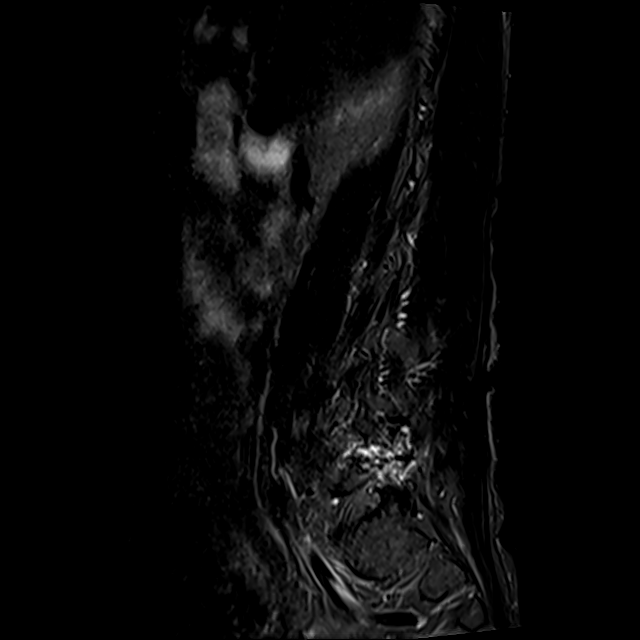

[Series 8: T2 · axial · 4.0mm · 0.78mm/px · z∈[-568,-359]mm · 8 of 36 slices shown (2 of 2)]
[im 1/36]
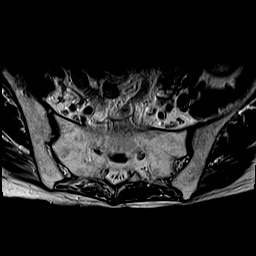
[im 6/36]
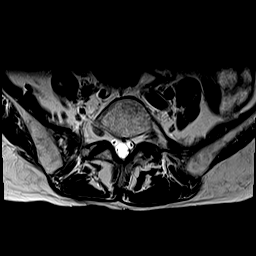
[im 11/36]
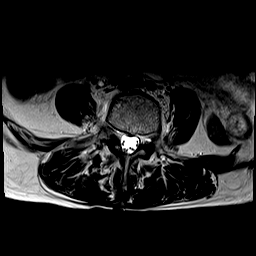
[im 17/36]
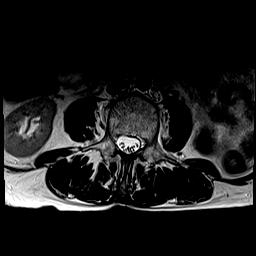
[im 19/36]
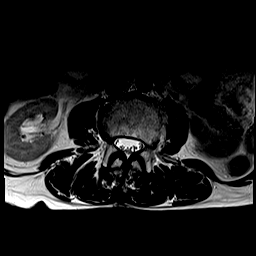
[im 25/36]
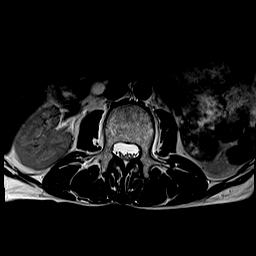
[im 30/36]
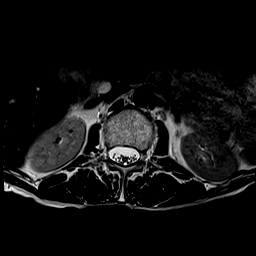
[im 36/36]
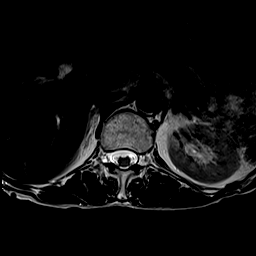

[Series 9: T1 · axial · 4.0mm · 0.39mm/px · z∈[-568,-359]mm · 8 of 36 slices shown (2 of 2)]
[im 1/36]
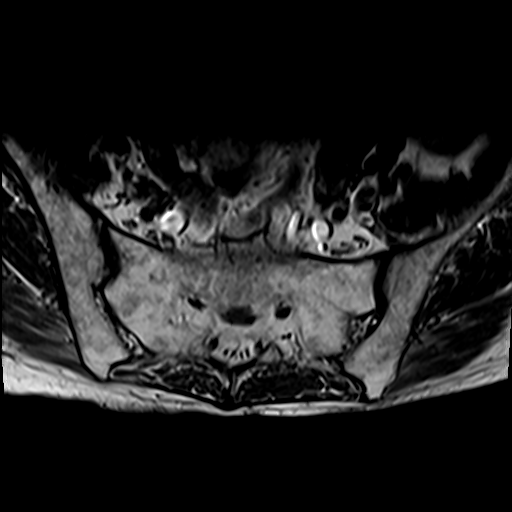
[im 6/36]
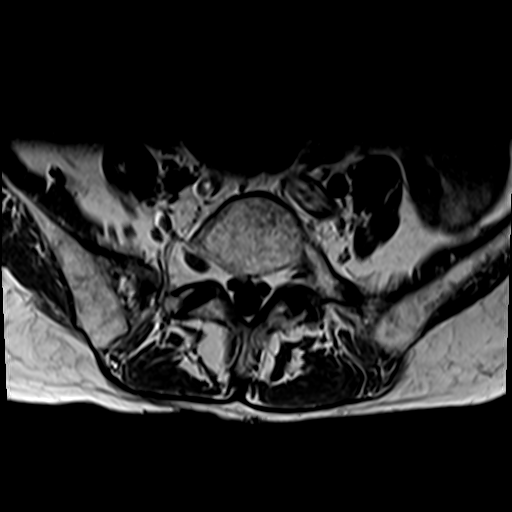
[im 11/36]
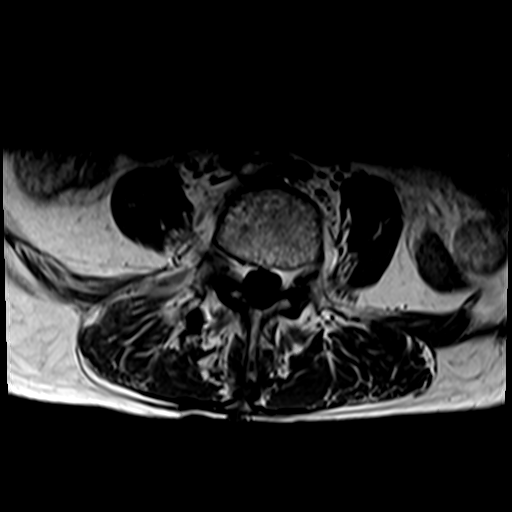
[im 17/36]
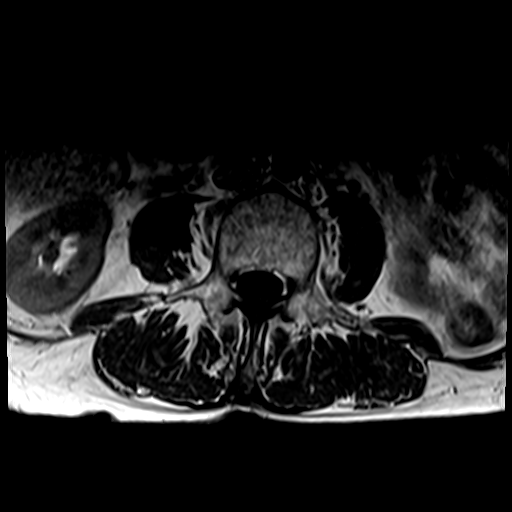
[im 19/36]
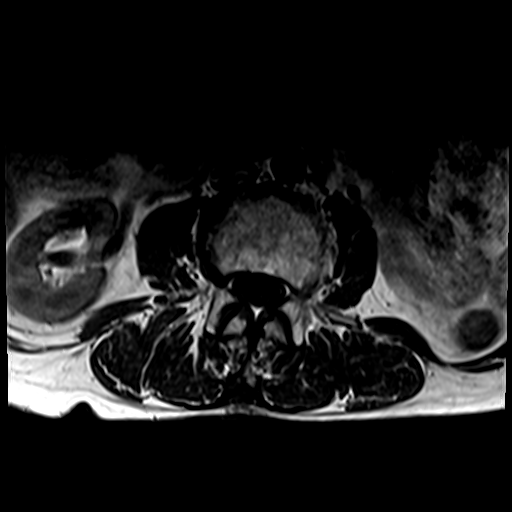
[im 25/36]
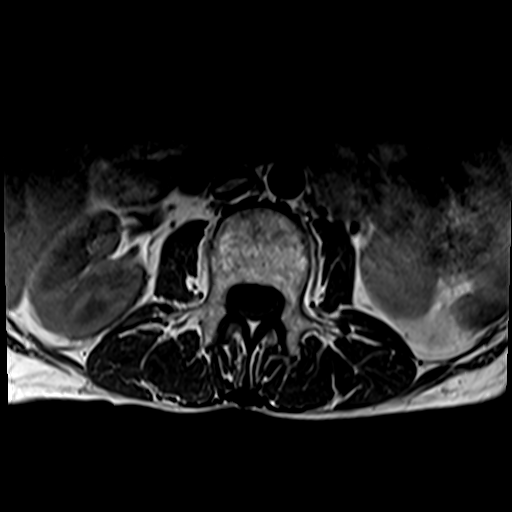
[im 30/36]
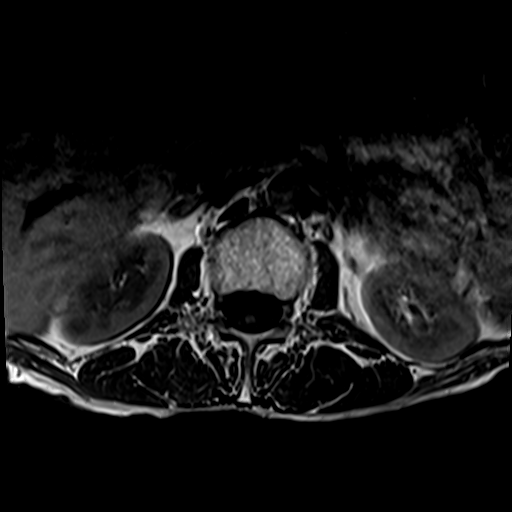
[im 36/36]
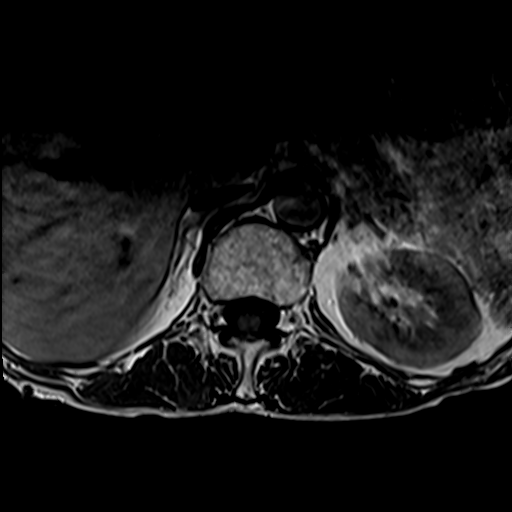

[31 of 48 positions shown; findings below may reference images not displayed]

FINDINGS: Segmentation:  Five lumbar type vertebral bodies.

Alignment:  1 or 2 mm of anterolisthesis at L4-5.

Vertebrae:  No fracture or focal bone lesion.

Conus medullaris and cauda equina: Conus extends to the L1 level.
Conus and cauda equina appear normal.

Paraspinal and other soft tissues: Negative

Disc levels:

No disc abnormality at T12-L1 or L1-2.

L2-3: Desiccation and mild bulging of the disc with slight
prominence in the left foraminal region. No change. No compressive
stenosis.

L3-4: Mild bulging of the disc. Mild facet and ligamentous
hypertrophy. No compressive stenosis.

L4-5: Previous right hemilaminotomy and partial facetectomy.
Recurrent right posterolateral to foraminal disc herniation which
could compress the right L4 and L5 nerves.

L5-S1: Mild bulging of the disc. Mild facet osteoarthritis. No
stenosis.
IMPRESSION: Previous right hemilaminotomy and partial facetectomy at L4-5.
Recurrent right posterolateral to foraminal disc herniation which
could compress the right L4 and L5 nerves.

## 2023-08-17 IMAGING — CT CT L SPINE W/O CM
3 of 4 series · 12 of 33 positions shown, 13 images · non-contrast
Comparison: MRI same day.  Preoperative MRI 09/07/2021.

CLINICAL DATA: Chronic low back pain radiating to both legs. Recent
low back surgery in Monday October, 2021. fell 2 days ago. Right-sided
pain.



[Series 3: l spine soft · axial · 0.33mm/px · z∈[-335,-169]mm · 4 of 121 slices shown, 5 images]
[im 19/121  soft-tissue]
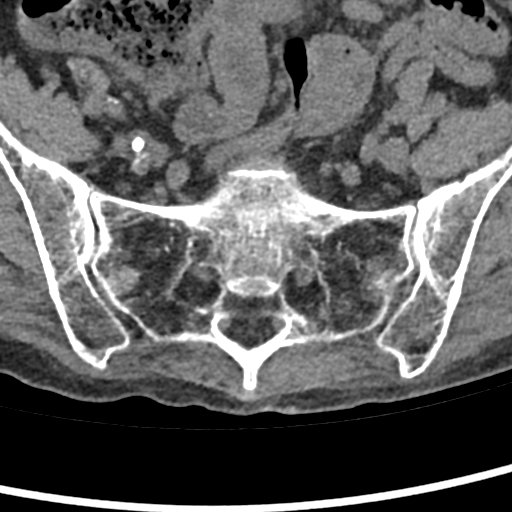
[im 19/121  bone]
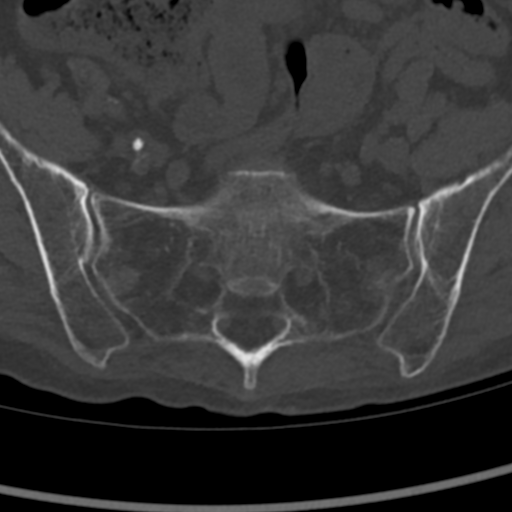
[im 47/121  bone]
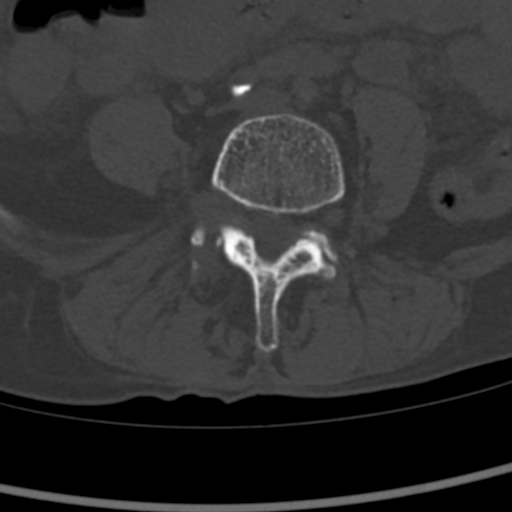
[im 74/121  bone]
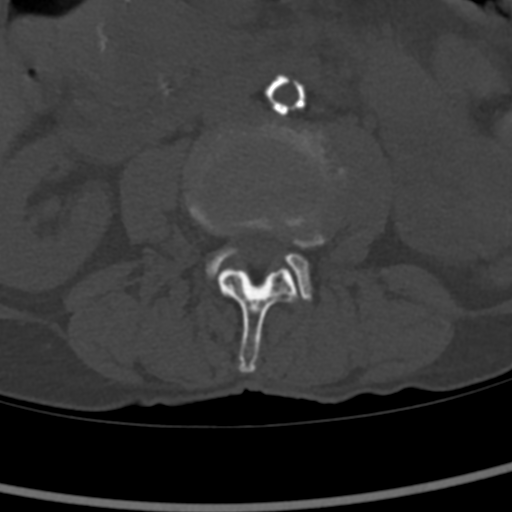
[im 102/121  bone]
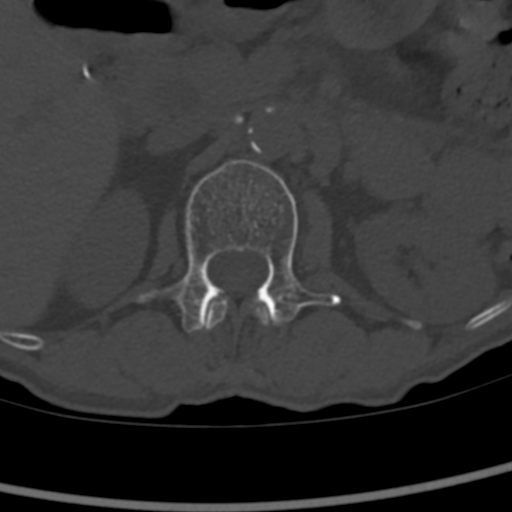

[Series 5: sagittal bone · sagittal · 0.33mm/px · 5 of 59 slices shown]
[im 10/59  bone]
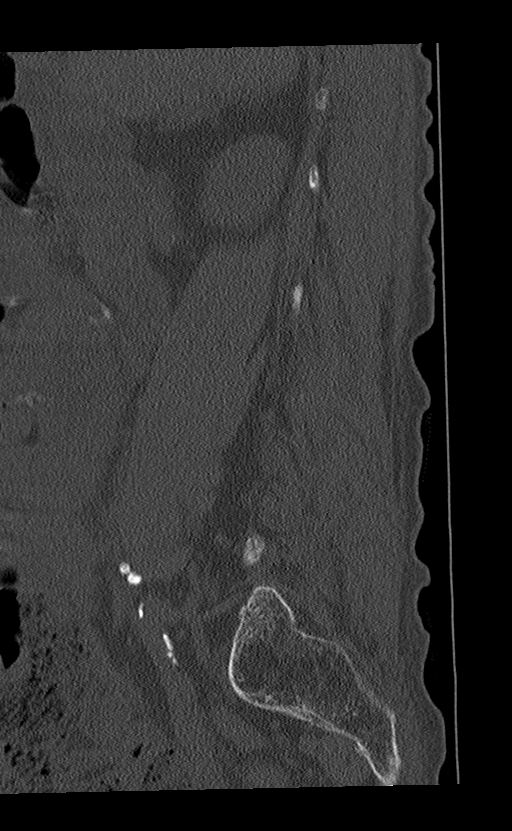
[im 20/59  bone]
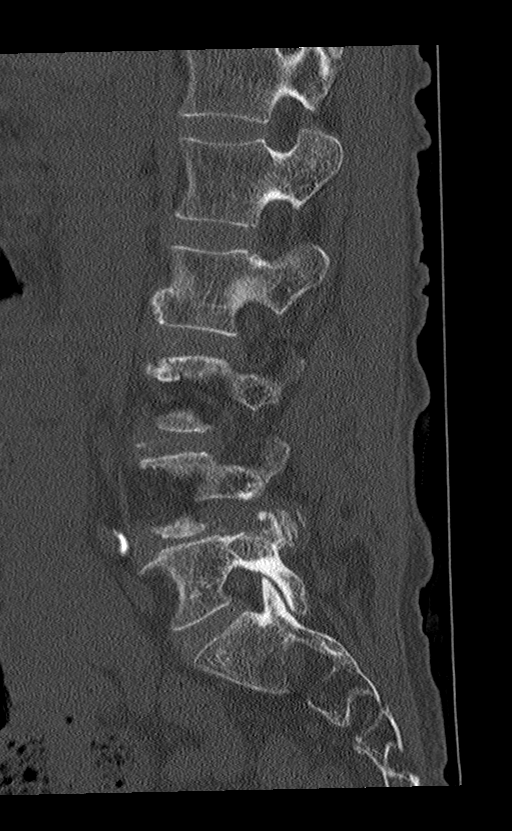
[im 30/59  bone]
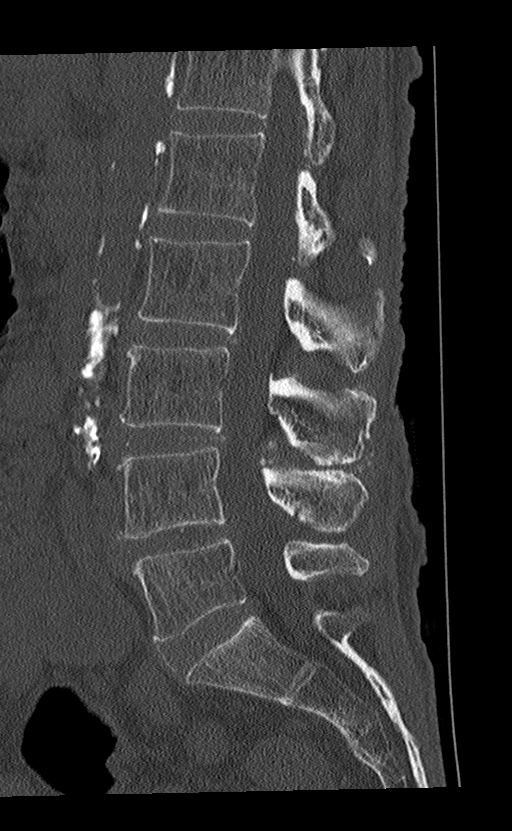
[im 39/59  bone]
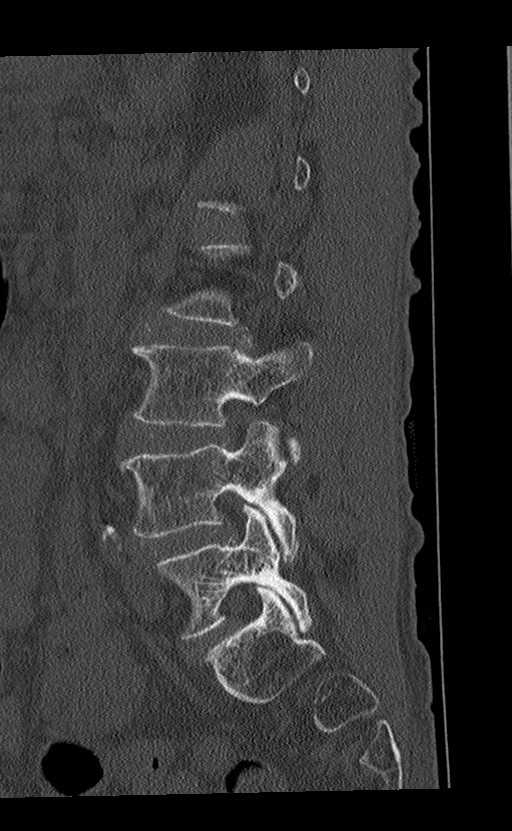
[im 49/59  bone]
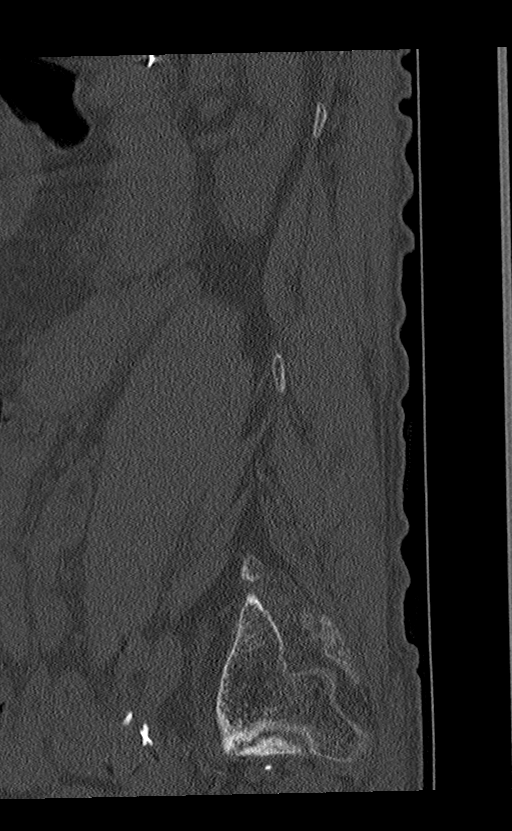

[Series 7: coronal bone · coronal · 0.25mm/px · 3 of 68 slices shown]
[im 14/68  bone]
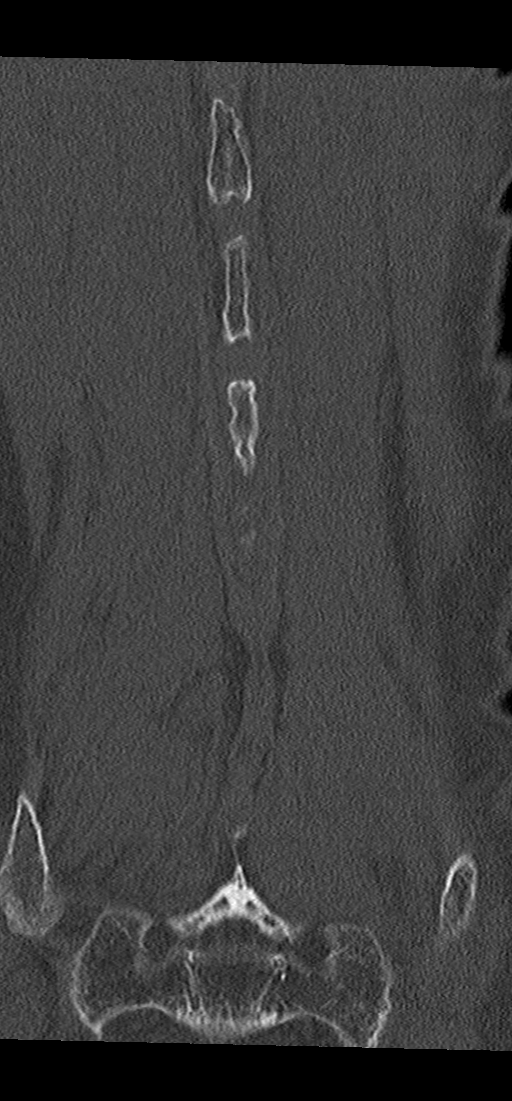
[im 27/68  bone]
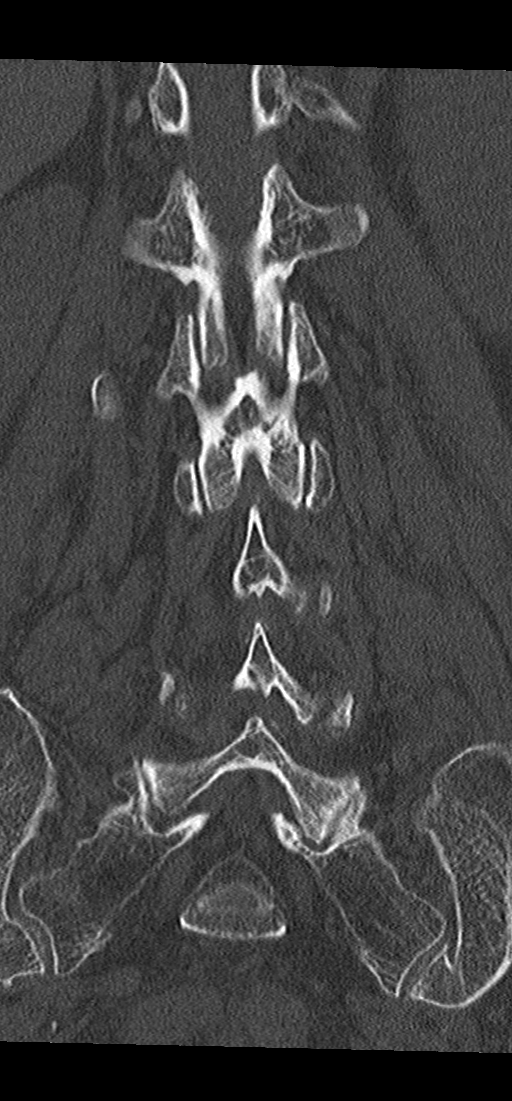
[im 41/68  bone]
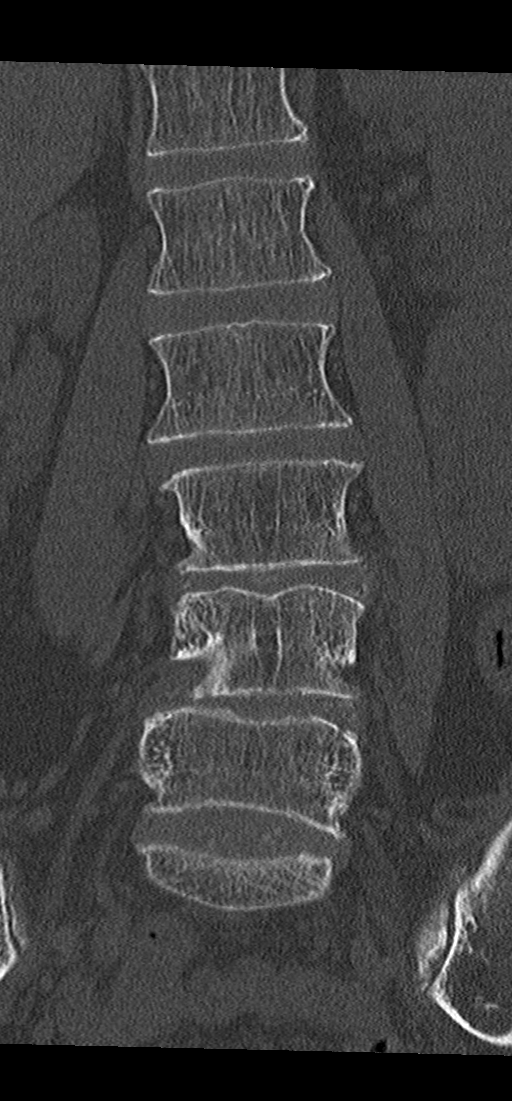

[12 of 33 positions shown; findings below may reference images not displayed]

FINDINGS: Segmentation: 5 lumbar type vertebral bodies as numbered previously.

Alignment: Scoliotic curvature convex to the left.

Vertebrae: No fracture or focal bone lesion.

Paraspinal and other soft tissues: Aortic atherosclerosis. Otherwise
negative.

Disc levels: No significant finding at L2-3 or above.

L3-4: Mild bulging of the disc. Mild facet and ligamentous
prominence. Mild narrowing of the lateral recesses without likely
neural compression.

L4-5: Interval right hemilaminotomy. CT concerning for a recurrent
right posterolateral to foraminal disc herniation. See results of
MRI study.

L5-S1: Mild bulging of the disc. Mild facet osteoarthritis. No
compressive stenosis.
IMPRESSION: Recent right L4-5 hemilaminotomy, partial facetectomy and
discectomy. CT findings worrisome for recurrent right posterolateral
to foraminal disc herniation. See results of MRI.

## 2023-08-19 ENCOUNTER — Encounter (INDEPENDENT_AMBULATORY_CARE_PROVIDER_SITE_OTHER): Payer: Self-pay | Admitting: Vascular Surgery

## 2023-08-19 ENCOUNTER — Ambulatory Visit (INDEPENDENT_AMBULATORY_CARE_PROVIDER_SITE_OTHER): Payer: Medicare Other | Admitting: Vascular Surgery

## 2023-08-19 VITALS — BP 120/69 | HR 88 | Resp 18 | Ht 63.0 in | Wt 88.4 lb

## 2023-08-19 DIAGNOSIS — K50912 Crohn's disease, unspecified, with intestinal obstruction: Secondary | ICD-10-CM | POA: Diagnosis not present

## 2023-08-19 DIAGNOSIS — I82511 Chronic embolism and thrombosis of right femoral vein: Secondary | ICD-10-CM | POA: Diagnosis not present

## 2023-08-19 NOTE — Progress Notes (Signed)
MRN : [id number]  Tamara [name] is a 77 y.o. (1946/08/13) female who presents with chief complaint of  Chief Complaint  Patient presents with   Follow-up    ED follow up  .  History of Present Illness: Patient returns today in follow up on referral from the emergency department.  She has not been seen in our office in over 5 years.  She has previously been seen for DVT and had a filter placed in the past.  She went to the hospital with leg pain and was found to have a large Baker's cyst.  She also had a DVT study that was interpreted as chronic nonocclusive thrombus in the right common femoral vein. A large Bakers cyst was seen as well.  She has a previous history of DVT in both legs.  She was started on full dose Xarelto which she is taking currently.  She has a lot of swelling behind her knee with a Baker's cyst but not overtly in the lower leg.  She has pain in her knees but not her legs proper.  Current Outpatient Medications  Medication Sig Dispense Refill   albuterol (VENTOLIN HFA) 108 (90 Base) MCG/ACT inhaler Inhale 2 puffs into the lungs every 4 (four) hours as needed. 18 g 0   clonazePAM (KLONOPIN) 0.5 MG tablet Take 1 tablet (0.5 mg total) by mouth daily as needed for anxiety. 30 tablet 5   cyanocobalamin (VITAMIN B12) 1000 MCG/ML injection USE 1 ML INTO MUSCLE EVERY 30 DAY AS DIRECTED 10 mL 2   nortriptyline (PAMELOR) 25 MG capsule TAKE 1 CAPSULE BY MOUTH AT BEDTIME 90 capsule 1   omeprazole (PRILOSEC) 40 MG capsule TAKE 1 CAPSULE BY MOUTH TWICE DAILY 180 capsule 1   ondansetron (ZOFRAN-ODT) 4 MG disintegrating tablet Take 4 mg by mouth every 8 (eight) hours as needed.     RIVAROXABAN (XARELTO) VTE STARTER PACK (15 & 20 MG) Follow package directions: Take one 15mg  tablet by mouth twice a day. On day 22, switch to one 20mg  tablet once a day. Take with food. 51 each 0   rosuvastatin (CRESTOR) 10 MG tablet TAKE 1 TABLET BY MOUTH AT BEDTIME 90 tablet 1   sertraline (ZOLOFT) 100  MG tablet TAKE 1 TABLET BY MOUTH ONCE DAILY 90 tablet 1   Spacer/Aero-Holding Chambers (AEROCHAMBER MV) inhaler Use as instructed 1 each 2   traZODone (DESYREL) 100 MG tablet TAKE 1 TABLET BY MOUTH AT BEDTIME 90 tablet 0   No current facility-administered medications for this visit.    Past Medical History:  Diagnosis Date   Acute deep vein thrombosis (DVT) of left lower extremity (HCC) 05/2021   Acute deep vein thrombosis (DVT) of right lower extremity (HCC) 01/2007   a.) occurred postoperatively; s/p IVC filter placement   Acute deep vein thrombosis (DVT) of right lower extremity (HCC) 04/24/2018   Amaurosis fugax 12/13/2014    Brain MRA suggested vertebral artery partial occlusion   75 to 90% stenosed. Will treat for prevention of embolic stroke,  Refer to [facility] for CTA and stent and refer to cardiology for ECHO    Anticoagulated on Coumadin 07/06/2021   New regimen 6 mg 5 days per week,  5 mg 2 days per week given INR of 1.9  On 5/6alternating         Lab Results  Component  Value  Date     INR  1.9 (H)  07/06/2021     INR  2.9 (H)  06/18/2021     INR  1.6 (H)  06/11/2021     3.1 on 12/06/21 INR, held one dose of coumadin since on 5 additional days of augmentin, she has restarted her current dose of Coumadin 5mg  tablet once with coumadin 1mg  table   Anxiety    a.) on BZO (clonazepam) PRN   Aortic atherosclerosis (HCC)    Arthritis    B12 deficiency    Carpal tunnel syndrome    Carpal tunnel syndrome 03/08/2014   Cataracts, bilateral    Clostridium difficile infection 05/24/2021   COPD (chronic obstructive pulmonary disease) (HCC)    no inhalers   Crohn's disease (HCC)    Depression    Dog bite 12/06/2021   Dysphagia 12/04/2017   Facial twitching 07/11/2018   GERD (gastroesophageal reflux disease)    History of cardiac catheterization 06/14/1997   a.) LHC 06/14/1997 at [facility]: EF 60%; normal coronaries; no obstructive CAD.   History of hiatal hernia     History of kidney stones    Hx of splenectomy    a.) reports spleen was "ruptured" during routine colonoscopy in [place], which necessitated surgical intervention   Intractable chronic migraine without aura and without status migrainosus 05/20/2019   Leukocytosis    Long term current use of anticoagulant    a.) warfarin   Moderate tricuspid insufficiency    MRSA colonization 12/2016   Osteoporosis    Pneumonia    Reflux esophagitis 01/24/2018   Small bowel obstruction (HCC)    a.) 2001, 2004 (reversal of jejunal bypass). b.) s/p laparotomy and lysis of adhesions 01/2007   Spinal stenosis of lumbar region    Vitamin D deficiency     Past Surgical History:  Procedure Laterality Date   ANAL SPHINCTER PROSTHESIS PLACEMENT     APPENDECTOMY     CARDIAC CATHETERIZATION Left 06/14/1997   Procedure: CARDIAC CATHETERIZATION; Location: [facility]; Surgeon: Lieutenant Diego, MD   CARPAL TUNNEL RELEASE Right 04/22/2014   CARPAL TUNNEL RELEASE Left    CATARACT EXTRACTION Bilateral    CERVICAL FUSION  2005   C6-7, anterior approach   CHOLECYSTECTOMY     COLONOSCOPY     2005, 2007, 2010, 2015, 2020, 2021   COLONOSCOPY WITH PROPOFOL N/A 05/10/2019   Procedure: COLONOSCOPY WITH PROPOFOL;  Surgeon: Scot Jun, MD;  Location: [facility] ENDOSCOPY;  Service: Endoscopy;  Laterality: N/A;   ESOPHAGEAL MANOMETRY N/A 02/11/2018   Procedure: ESOPHAGEAL MANOMETRY (EM);  Surgeon: Midge Minium, MD;  Location: [facility] ENDOSCOPY;  Service: Endoscopy;  Laterality: N/A;   ESOPHAGOGASTRODUODENOSCOPY     1996, 2006, 2008, 2010, 2014, 2018, 2021   ESOPHAGOGASTRODUODENOSCOPY (EGD) WITH PROPOFOL N/A 12/17/2017   Procedure: ESOPHAGOGASTRODUODENOSCOPY (EGD) WITH PROPOFOL;  Surgeon: Earline Mayotte, MD;  Location: [facility] ENDOSCOPY;  Service: Endoscopy;  Laterality: N/A;   ESOPHAGOGASTRODUODENOSCOPY (EGD) WITH PROPOFOL N/A 01/08/2018   Procedure: ESOPHAGOGASTRODUODENOSCOPY (EGD) WITH PROPOFOL;  Surgeon: Earline Mayotte, MD;   Location: [facility] ENDOSCOPY;  Service: Endoscopy;  Laterality: N/A;   EXCISION NEUROMA Left 09/18/2021   Procedure: Excision of traumatic neuroma infrapatellar branch saphenous nerve left knee.;  Surgeon: Christena Flake, MD;  Location: [facility] ORS;  Service: Orthopedics;  Laterality: Left;   GIVENS CAPSULE STUDY     2005, 2006   INSERTION OF VENA CAVA FILTER Right    KNEE ARTHROSCOPY WITH MEDIAL MENISECTOMY Left 06/22/2020   Procedure: Left knee arthroscopic partial medial meniscectomy;  Surgeon: Signa Kell, MD;  Location: [facility];  Service: Orthopedics;  Laterality: Left;   KNEE ARTHROSCOPY WITH MEDIAL MENISECTOMY Left 05/25/2021   Procedure: Left knee arthroscopy, infrapatellar fat pad excision;  Surgeon: Signa Kell, MD;  Location: [facility] ORS;  Service: Orthopedics;  Laterality: Left;   LAPAROTOMY  01/30/2007   for bowel obstruction with LOA   LUMBAR LAMINECTOMY/DECOMPRESSION MICRODISCECTOMY N/A 12/25/2016   Procedure: LUMBAR DECOMPRESSION L4-5;  Surgeon: Venetia Night, MD;  Location: [facility] ORS;  Service: Neurosurgery;  Laterality: N/A;   NISSEN FUNDOPLICATION     ROTATOR CUFF REPAIR Bilateral    SHOULDER ARTHROSCOPY WITH SUBACROMIAL DECOMPRESSION, ROTATOR CUFF REPAIR AND BICEP TENDON REPAIR Left 03/04/2023   Procedure: LEFT SHOULDER ARTHROSCOPY WITH DEBRIDEMENT, DECOMPRESSION, ROATOR CUFF REPAIR, AND POSSIBLE BICEPS TENODESIS. - RNFA;  Surgeon: Christena Flake, MD;  Location: [facility] ORS;  Service: Orthopedics;  Laterality: Left;   SPLENECTOMY     secondary to colonoscopy   TONSILLECTOMY AND ADENOIDECTOMY     TRANSFORAMINAL LUMBAR INTERBODY FUSION (TLIF) WITH PEDICLE SCREW FIXATION 1 LEVEL N/A 04/01/2022   Procedure: OPEN L4-5 TRANSFORAMINAL LUMBAR INTERBODY FUSION (TLIF);  Surgeon: Venetia Night, MD;  Location: [facility] ORS;  Service: Neurosurgery;  Laterality: N/A;   TRIGGER FINGER RELEASE Left 08/25/2015   TRIGGER FINGER RELEASE Right 09/18/2015   VAGINAL HYSTERECTOMY        Social History   Tobacco Use   Smoking status: Every Day    Current packs/day: 1.00    Average packs/day: 1 pack/day for 50.2 years (50.2 ttl pk-yrs)    Types: Cigarettes    Start date: 06/13/2022   Smokeless tobacco: Never  Vaping Use   Vaping status: Never Used  Substance Use Topics   Alcohol use: No    Alcohol/week: 0.0 standard drinks of alcohol   Drug use: No      Family History  Problem Relation Age of Onset   Diabetes Mother        type 2   Heart disease Mother    Hypertension Mother    Coronary artery disease Father    Heart attack Sister     Allergies  Allergen Reactions   Eliquis [Apixaban] Nausea Only    Dizziness   Meloxicam Itching   Sulfa Antibiotics Rash     REVIEW OF SYSTEMS (Negative unless checked)  Constitutional: [x] Weight loss  [] Fever  [] Chills Cardiac: [] Chest pain   [] Chest pressure   [] Palpitations   [] Shortness of breath when laying flat   [] Shortness of breath at rest   [] Shortness of breath with exertion. Vascular:  [] Pain in legs with walking   [] Pain in legs at rest   [] Pain in legs when laying flat   [] Claudication   [] Pain in feet when walking  [] Pain in feet at rest  [] Pain in feet when laying flat   [x] History of DVT   [] Phlebitis   [] Swelling in legs   [] Varicose veins   [] Non-healing ulcers Pulmonary:   [] Uses home oxygen   [] Productive cough   [] Hemoptysis   [] Wheeze  [] COPD   [] Asthma Neurologic:  [] Dizziness  [] Blackouts   [] Seizures   [] History of stroke   [] History of TIA  [] Aphasia   [] Temporary blindness   [] Dysphagia   [] Weakness or numbness in arms   [] Weakness or numbness in legs Musculoskeletal:  [x] Arthritis   [] Joint swelling   [x] Joint pain   [] Low back pain Hematologic:  [] Easy bruising  [] Easy bleeding   [] Hypercoagulable state   [] Anemic   Gastrointestinal:  [] Blood in stool   [] Vomiting blood  [] Gastroesophageal reflux/heartburn   [  x]Abdominal pain Genitourinary:  [] Chronic kidney disease   [] Difficult  urination  [] Frequent urination  [] Burning with urination   [] Hematuria Skin:  [] Rashes   [] Ulcers   [] Wounds Psychological:  [] History of anxiety   []  History of major depression.  Physical Examination  BP 120/69 (BP Location: Right Arm)   Pulse 88   Resp 18   Ht 5\' 3"  (1.6 m)   Wt 88 lb 6.4 oz (40.1 kg)   BMI 15.66 kg/m  Gen:  Thin, NAD Head: [place]/AT, No temporalis wasting. Ear/Nose/Throat: Hearing grossly intact, nares w/o erythema or drainage Eyes: Conjunctiva clear. Sclera non-icteric Neck: Supple.  Trachea midline Pulmonary:  Good air movement, no use of accessory muscles.  Cardiac: RRR, no JVD Vascular:  Vessel Right Left  Radial Palpable Palpable           Musculoskeletal: M/S 5/5 throughout.  No deformity or atrophy. Bakers cyst is palpable in right popliteal fossa.  No edema. Neurologic: Sensation grossly intact in extremities.  Symmetrical.  Speech is fluent.  Psychiatric: Judgment intact, Mood & affect appropriate for pt's clinical situation. Dermatologic: No rashes or ulcers noted.  No cellulitis or open wounds.      Labs Recent Results (from the past 2160 hour(s))  Comprehensive metabolic panel     Status: Abnormal   Collection Time: 06/03/23 12:02 PM  Result Value Ref Range   Sodium 142 135 - 145 mEq/L   Potassium 3.8 3.5 - 5.1 mEq/L   Chloride 112 96 - 112 mEq/L   CO2 24 19 - 32 mEq/L   Glucose, Bld 65 (L) 70 - 99 mg/dL   BUN 10 6 - 23 mg/dL   Creatinine, Ser 7.82 0.40 - 1.20 mg/dL   Total Bilirubin 0.4 0.2 - 1.2 mg/dL   Alkaline Phosphatase 40 39 - 117 U/L   AST 21 0 - 37 U/L   ALT 18 0 - 35 U/L   Total Protein 5.7 (L) 6.0 - 8.3 g/dL   Albumin 4.0 3.5 - 5.2 g/dL   GFR 95.62 (L) >13.08 mL/min    Comment: Calculated using the CKD-EPI Creatinine Equation (2021)   Calcium 9.0 8.4 - 10.5 mg/dL  VITAMIN D 25 Hydroxy (Vit-D Deficiency, Fractures)     Status: None   Collection Time: 06/03/23 12:02 PM  Result Value Ref Range   VITD 57.90 30.00 - 100.00  ng/mL  Hemoglobin A1c     Status: None   Collection Time: 06/03/23 12:02 PM  Result Value Ref Range   Hgb A1c MFr Bld 6.1 4.6 - 6.5 %    Comment: Glycemic Control Guidelines for People with Diabetes:Non Diabetic:  <6%Goal of Therapy: <7%Additional Action Suggested:  >8%   Lipid Panel w/reflex Direct LDL     Status: Abnormal   Collection Time: 06/03/23 12:02 PM  Result Value Ref Range   Cholesterol 128 <200 mg/dL   HDL 72 > OR = 50 mg/dL   Triglycerides 657 (H) <150 mg/dL   LDL Cholesterol (Calc) 31 mg/dL (calc)    Comment: Reference range: <100 . Desirable range <100 mg/dL for primary prevention;   <70 mg/dL for patients with CHD or diabetic patients  with > or = 2 CHD risk factors. Marland Kitchen LDL-C is now calculated using the Martin-Hopkins  calculation, which is a validated novel method providing  better accuracy than the Friedewald equation in the  estimation of LDL-C.  Horald Pollen et al. Lenox Ahr. 8469;629(52): 2061-2068  (http://education.[employer].com/faq/FAQ164)    Total CHOL/HDL Ratio 1.8 <5.0 (calc)  Non-HDL Cholesterol (Calc) 56 <161 mg/dL (calc)    Comment: For patients with diabetes plus 1 major ASCVD risk  factor, treating to a non-HDL-C goal of <100 mg/dL  (LDL-C of <09 mg/dL) is considered a therapeutic  option.   B12 and Folate Panel     Status: None   Collection Time: 06/03/23 12:02 PM  Result Value Ref Range   Vitamin B-12 649 211 - 911 pg/mL   Folate >23.8 >5.9 ng/mL  Magnesium     Status: None   Collection Time: 06/03/23 12:02 PM  Result Value Ref Range   Magnesium 1.8 1.5 - 2.5 mg/dL    Radiology US Venous Img Lower Unilateral Right  Result Date: 07/28/2023 CLINICAL DATA:  Swelling previous DVT. EXAM: Right LOWER EXTREMITY VENOUS DOPPLER ULTRASOUND TECHNIQUE: Gray-scale sonography with compression, as well as color and duplex ultrasound, were performed to evaluate the deep venous system(s) from the level of the common femoral vein through the popliteal and  proximal calf veins. COMPARISON:  None Available. FINDINGS: VENOUS There is incomplete compression of the right common femoral vein. There is some flow. Similar appearance of the saphenofemoral junction. Pelvis is heterogeneous with some areas of echogenicity. More normal appearance with compression involving the femoral vein, popliteal vein, posterior tibial and peroneal veins. Preserved flow on color and spectral Doppler. OTHER Well-defined fluid collection identified in the popliteal fossa without blood flow on Doppler measuring 4.1 x 5.4 x 2.1 cm. Possible Baker's cyst. Limitations: none IMPRESSION: Chronic appearing nonocclusive thrombus in the common femoral and saphenofemoral junction. No acute DVT otherwise identified in the right lower extremity. Possible Baker's cyst.  Confirmatory study as clinically appropriate Electronically Signed   By: Karen Kays M.D.   On: 07/28/2023 10:07    Assessment/Plan  DVT (deep venous thrombosis) (HCC) She had a DVT study that was interpreted as chronic nonocclusive thrombus in the right common femoral vein. A large Bakers cyst was seen as well.  She has previous history of extensive DVT of the lower extremities, and this chronic nonocclusive thrombus is likely a sequela of that.  This would not require full dose anticoagulation.  Either a prophylactic dose of anticoagulation or aspirin would be acceptable options for reduction of risk of recurrent acute DVT.  She does not like taking the blood thinners and say they make her feel poorly, and would like to take an aspirin a day so that is reasonable.  I will see her back as needed.  Crohn's disease of intestine, with intestinal obstruction (HCC) Lots of questions about the current general surgeons in our hospital since her previous general surgeon has retired.  No need for recurrent surgery.    Festus Barren, MD  08/19/2023 11:07 AM    This note was created with Dragon medical transcription system.  Any errors  from dictation are purely unintentional

## 2023-08-19 NOTE — Assessment & Plan Note (Signed)
Lots of questions about the current general surgeons in our hospital since her previous general surgeon has retired.  No need for recurrent surgery.

## 2023-08-19 NOTE — Assessment & Plan Note (Signed)
She had a DVT study that was interpreted as chronic nonocclusive thrombus in the right common femoral vein. A large Bakers cyst was seen as well.  She has previous history of extensive DVT of the lower extremities, and this chronic nonocclusive thrombus is likely a sequela of that.  This would not require full dose anticoagulation.  Either a prophylactic dose of anticoagulation or aspirin would be acceptable options for reduction of risk of recurrent acute DVT.  She does not like taking the blood thinners and say they make her feel poorly, and would like to take an aspirin a day so that is reasonable.  I will see her back as needed.

## 2023-08-26 DIAGNOSIS — M1711 Unilateral primary osteoarthritis, right knee: Secondary | ICD-10-CM | POA: Diagnosis not present

## 2023-08-26 DIAGNOSIS — G8929 Other chronic pain: Secondary | ICD-10-CM | POA: Diagnosis not present

## 2023-08-26 DIAGNOSIS — M11261 Other chondrocalcinosis, right knee: Secondary | ICD-10-CM | POA: Diagnosis not present

## 2023-08-26 DIAGNOSIS — M7121 Synovial cyst of popliteal space [Baker], right knee: Secondary | ICD-10-CM | POA: Diagnosis not present

## 2023-09-05 DIAGNOSIS — M1711 Unilateral primary osteoarthritis, right knee: Secondary | ICD-10-CM | POA: Diagnosis not present

## 2023-09-05 DIAGNOSIS — M11261 Other chondrocalcinosis, right knee: Secondary | ICD-10-CM | POA: Diagnosis not present

## 2023-09-05 DIAGNOSIS — M7582 Other shoulder lesions, left shoulder: Secondary | ICD-10-CM | POA: Diagnosis not present

## 2023-09-05 DIAGNOSIS — M7121 Synovial cyst of popliteal space [Baker], right knee: Secondary | ICD-10-CM | POA: Diagnosis not present

## 2023-09-05 DIAGNOSIS — M75122 Complete rotator cuff tear or rupture of left shoulder, not specified as traumatic: Secondary | ICD-10-CM | POA: Diagnosis not present

## 2023-09-05 DIAGNOSIS — M24112 Other articular cartilage disorders, left shoulder: Secondary | ICD-10-CM | POA: Diagnosis not present

## 2023-09-05 DIAGNOSIS — S46102D Unspecified injury of muscle, fascia and tendon of long head of biceps, left arm, subsequent encounter: Secondary | ICD-10-CM | POA: Diagnosis not present

## 2023-09-08 DIAGNOSIS — K5090 Crohn's disease, unspecified, without complications: Secondary | ICD-10-CM | POA: Diagnosis not present

## 2023-09-08 DIAGNOSIS — K5989 Other specified functional intestinal disorders: Secondary | ICD-10-CM | POA: Diagnosis not present

## 2023-09-22 ENCOUNTER — Other Ambulatory Visit: Payer: Self-pay

## 2023-09-22 ENCOUNTER — Emergency Department
Admission: EM | Admit: 2023-09-22 | Discharge: 2023-09-22 | Disposition: A | Discharge status: Home / Self Care | Payer: Medicare Other | Attending: Emergency Medicine | Admitting: Emergency Medicine

## 2023-09-22 ENCOUNTER — Encounter (INDEPENDENT_AMBULATORY_CARE_PROVIDER_SITE_OTHER): Payer: Self-pay | Admitting: Vascular Surgery

## 2023-09-22 DIAGNOSIS — K2900 Acute gastritis without bleeding: Secondary | ICD-10-CM | POA: Diagnosis not present

## 2023-09-22 DIAGNOSIS — R1013 Epigastric pain: Secondary | ICD-10-CM

## 2023-09-22 DIAGNOSIS — J449 Chronic obstructive pulmonary disease, unspecified: Secondary | ICD-10-CM | POA: Diagnosis not present

## 2023-09-22 LAB — URINALYSIS, ROUTINE W REFLEX MICROSCOPIC
Bilirubin Urine: NEGATIVE
Glucose, UA: NEGATIVE mg/dL
Hgb urine dipstick: NEGATIVE
Ketones, ur: NEGATIVE mg/dL
Nitrite: POSITIVE — AB
Protein, ur: NEGATIVE mg/dL
RBC / HPF: 0 RBC/hpf (ref 0–5)
Specific Gravity, Urine: 1.005 (ref 1.005–1.030)
pH: 5 (ref 5.0–8.0)

## 2023-09-22 LAB — LIPASE, BLOOD: Lipase: 48 U/L (ref 11–51)

## 2023-09-22 LAB — COMPREHENSIVE METABOLIC PANEL
ALT: 45 U/L — ABNORMAL HIGH (ref 0–44)
AST: 35 U/L (ref 15–41)
Albumin: 4.1 g/dL (ref 3.5–5.0)
Alkaline Phosphatase: 42 U/L (ref 38–126)
Anion gap: 9 (ref 5–15)
BUN: 11 mg/dL (ref 8–23)
CO2: 23 mmol/L (ref 22–32)
Calcium: 9.1 mg/dL (ref 8.9–10.3)
Chloride: 107 mmol/L (ref 98–111)
Creatinine, Ser: 0.75 mg/dL (ref 0.44–1.00)
GFR, Estimated: >60 mL/min (ref >60)
Glucose, Bld: 116 mg/dL — ABNORMAL HIGH (ref 70–99)
Potassium: 3.8 mmol/L (ref 3.5–5.1)
Sodium: 139 mmol/L (ref 135–145)
Total Bilirubin: 0.2 mg/dL (ref <1.2)
Total Protein: 6.4 g/dL — ABNORMAL LOW (ref 6.5–8.1)

## 2023-09-22 LAB — CBC
HCT: 41.2 % (ref 36.0–46.0)
Hemoglobin: 13.7 g/dL (ref 12.0–15.0)
MCH: 34.1 pg — ABNORMAL HIGH (ref 26.0–34.0)
MCHC: 33.3 g/dL (ref 30.0–36.0)
MCV: 102.5 fL — ABNORMAL HIGH (ref 80.0–100.0)
Platelets: 231 10*3/uL (ref 150–400)
RBC: 4.02 MIL/uL (ref 3.87–5.11)
RDW: 14.8 % (ref 11.5–15.5)
WBC: 8.2 10*3/uL (ref 4.0–10.5)
nRBC: 0 % (ref 0.0–0.2)

## 2023-09-22 MED ORDER — ONDANSETRON 4 MG PO TBDP
4.0000 mg | ORAL_TABLET | Freq: Once | ORAL | Status: AC
  Administered 2023-09-22: 4 mg via ORAL
  Filled 2023-09-22: qty 1

## 2023-09-22 MED ORDER — ALUM & MAG HYDROXIDE-SIMETH 200-200-20 MG/5ML PO SUSP
30.0000 mL | Freq: Once | ORAL | Status: AC
  Administered 2023-09-22: 30 mL via ORAL
  Filled 2023-09-22: qty 30

## 2023-09-22 MED ORDER — RABEPRAZOLE SODIUM 20 MG PO TBEC: 20.0000 mg | DELAYED_RELEASE_TABLET | Freq: Two times a day (BID) | ORAL | 1 refills | Status: DC

## 2023-09-22 MED ORDER — LIDOCAINE VISCOUS HCL 2 % MT SOLN
15.0000 mL | Freq: Once | OROMUCOSAL | Status: AC
  Administered 2023-09-22: 15 mL via ORAL
  Filled 2023-09-22: qty 15

## 2023-09-22 NOTE — ED Triage Notes (Signed)
Pt comes with belly pain and nausea for about 2-3 weeks. Pt states she thinks it is ulcer. Pt has hx of chron's. Pt states she can't eat.

## 2023-09-22 NOTE — Discharge Instructions (Signed)
You should stop taking the prednisone as this is likely making your symptoms worse.  You may accelerate the taper by taking 1 tablet daily for the next 7 days and then stop.  We will switch your medication from omeprazole to to rabeprazole and you should schedule follow-up with your GI doctor as soon as possible.  Please return to the ER for any worsening symptoms.

## 2023-09-22 NOTE — ED Notes (Signed)
See triage notes. Patient c/o abdominal discomfort and burning times 2-3 weeks. Patient stated she has had N/V/D with this and a lack of appetite

## 2023-09-22 NOTE — ED Provider Notes (Signed)
[facility] Provider Note    Event Date/Time   First MD Initiated Contact with Patient 09/22/23 1033     (approximate)   History   Chief Complaint Abdominal Pain   HPI  Tamara [name] is a 77 y.o. female with past medical history of Crohn's, DVT, GERD, and COPD who presents to the ED complaining of abdominal pain.  Patient reports that she has had 3 weeks of increasing burning discomfort in her epigastrium which is worse when she eats.  She states that makes it very difficult for her to eat anything and it has been associated with nausea, but no vomiting.  She denies any changes in her bowel movements and has not noticed any blood in her stool.  She also denies any difficulty urinating and has not had any dysuria or flank pain.  She denies any fevers, cough, chest pain, shortness of breath.  She states her GI provider recently started her on prednisone due to concern for Crohn's flare, however symptoms have only been worsening since then.     Physical Exam   Triage Vital Signs: ED Triage Vitals  Encounter Vitals Group     BP 09/22/23 0937 110/70     Systolic BP Percentile --      Diastolic BP Percentile --      Pulse Rate 09/22/23 0937 79     Resp 09/22/23 0937 18     Temp 09/22/23 0937 97.7 F (36.5 C)     Temp src --      SpO2 09/22/23 0937 100 %     Weight 09/22/23 0935 85 lb (38.6 kg)     Height 09/22/23 0935 5\' 3"  (1.6 m)     Head Circumference --      Peak Flow --      Pain Score 09/22/23 0935 8     Pain Loc --      Pain Education --      Exclude from Growth Chart --     Most recent vital signs: Vitals:   09/22/23 0937  BP: 110/70  Pulse: 79  Resp: 18  Temp: 97.7 F (36.5 C)  SpO2: 100%    Constitutional: Alert and oriented. Eyes: Conjunctivae are normal. Head: Atraumatic. Nose: No congestion/rhinnorhea. Mouth/Throat: Mucous membranes are moist.  Cardiovascular: Normal rate, regular rhythm. Grossly normal heart sounds.  2+  radial pulses bilaterally. Respiratory: Normal respiratory effort.  No retractions. Lungs CTAB. Gastrointestinal: Soft and tender to palpation in the epigastrium with no rebound or guarding. No distention. Musculoskeletal: No lower extremity tenderness nor edema.  Neurologic:  Normal speech and language. No gross focal neurologic deficits are appreciated.    ED Results / Procedures / Treatments   Labs (all labs ordered are listed, but only abnormal results are displayed) Labs Reviewed  COMPREHENSIVE METABOLIC PANEL - Abnormal; Notable for the following components:      Result Value   Glucose, Bld 116 (*)    Total Protein 6.4 (*)    ALT 45 (*)    All other components within normal limits  CBC - Abnormal; Notable for the following components:   MCV 102.5 (*)    MCH 34.1 (*)    All other components within normal limits  URINALYSIS, ROUTINE W REFLEX MICROSCOPIC - Abnormal; Notable for the following components:   Color, Urine YELLOW (*)    APPearance CLEAR (*)    Nitrite POSITIVE (*)    Leukocytes,Ua TRACE (*)    Bacteria, UA RARE (*)  All other components within normal limits  LIPASE, BLOOD    PROCEDURES:  Critical Care performed: No  Procedures   MEDICATIONS ORDERED IN ED: Medications  alum & mag hydroxide-simeth (MAALOX/MYLANTA) 200-200-20 MG/5ML suspension 30 mL (30 mLs Oral Given 09/22/23 1048)    And  lidocaine (XYLOCAINE) 2 % viscous mouth solution 15 mL (15 mLs Oral Given 09/22/23 1048)  ondansetron (ZOFRAN-ODT) disintegrating tablet 4 mg (4 mg Oral Given 09/22/23 1049)     IMPRESSION / MDM / ASSESSMENT AND PLAN / ED COURSE  I reviewed the triage vital signs and the nursing notes.                              77 y.o. female with past medical history of Crohn's disease, DVT, GERD, and COPD who presents to the ED with increasing burning discomfort in her epigastrium over the past 3 weeks.  Patient's presentation is most consistent with acute presentation  with potential threat to life or bodily function.  Differential diagnosis includes, but is not limited to, gastritis, GERD, pancreatitis, hepatitis, cholecystitis, biliary colic, bowel obstruction.  Patient well-appearing and in no acute distress, vital signs are unremarkable.  Her abdomen is soft with some tenderness in her epigastrium.  Labs are reassuring with no significant anemia, leukocytosis, electrolyte abnormality, or AKI.  LFTs and lipase are also unremarkable.  Symptoms seem most consistent with gastritis/GERD, potentially exacerbated by steroids she was prescribed for Crohn's disease.  With her complicated abdominal surgical history, I recommended that we proceed with CT imaging of her abdomen, however patient declined this and stated she did not want to wait that long.  Patient prefers that we see how she responds to GI cocktail and dose of Zofran.  Patient reports feeling better following GI cocktail, symptoms seem consistent with gastritis and I have advised her to stop steroids as soon as possible.  She may accelerate her taper and we will change PPI to rabeprazole, which had previously been recommended by GI but patient states that she never ended up taking it.  She was counseled to follow-up with GI and to return to the ED for new or worsening symptoms, patient agrees with plan.      FINAL CLINICAL IMPRESSION(S) / ED DIAGNOSES   Final diagnoses:  Epigastric pain  Acute gastritis without hemorrhage, unspecified gastritis type     Rx / DC Orders   ED Discharge Orders          Ordered    RABEprazole (ACIPHEX) 20 MG tablet  2 times daily        09/22/23 1121             Note:  This document was prepared using Dragon voice recognition software and may include unintentional dictation errors.   Chesley Noon, MD 09/22/23 1124

## 2023-09-23 NOTE — Telephone Encounter (Signed)
-  Spoke to patient and she stated she mentioned having a history of blood clot to her orthopedist. She is asking for a note stating she is okay to have surgery. I informed her she needs a medical clearance sent to be signed. She stated we have to send something.   -I called [facility] to figure out what she needed and asked for a clearance for to be sent over.  Patient talked to someone there to schedule and appt but there isn't a date set yet.

## 2023-09-25 ENCOUNTER — Other Ambulatory Visit: Payer: Self-pay | Admitting: Internal Medicine

## 2023-10-02 DIAGNOSIS — R109 Unspecified abdominal pain: Secondary | ICD-10-CM | POA: Diagnosis not present

## 2023-10-02 DIAGNOSIS — Z8619 Personal history of other infectious and parasitic diseases: Secondary | ICD-10-CM | POA: Diagnosis not present

## 2023-10-02 DIAGNOSIS — K5090 Crohn's disease, unspecified, without complications: Secondary | ICD-10-CM | POA: Diagnosis not present

## 2023-10-02 DIAGNOSIS — G8929 Other chronic pain: Secondary | ICD-10-CM | POA: Diagnosis not present

## 2023-10-02 DIAGNOSIS — K50912 Crohn's disease, unspecified, with intestinal obstruction: Secondary | ICD-10-CM | POA: Diagnosis not present

## 2023-10-02 DIAGNOSIS — K219 Gastro-esophageal reflux disease without esophagitis: Secondary | ICD-10-CM | POA: Diagnosis not present

## 2023-10-10 ENCOUNTER — Telehealth: Payer: Self-pay

## 2023-10-10 NOTE — Telephone Encounter (Signed)
Transition Care Management Unsuccessful Follow-up Telephone Call  Date of discharge and from where:  [place] 11/11  Attempts:  1st Attempt  Reason for unsuccessful TCM follow-up call:  No answer/busy   Tamara [name] [place]  [facility], [facility] Guide, Phone: [phone] Website: [email or web address]

## 2023-10-11 IMAGING — RF DG LUMBAR SPINE 2-3V
1 series · 3 of 3 positions shown · non-contrast
Comparison: MRI 02/05/2022

CLINICAL DATA: L4-5 fixation

EXAM:
LUMBAR SPINE - 2-3 VIEW

[Series 1: dg x-ray · 0.20mm/px · 3 of 3 slices shown]
[im 1/3]
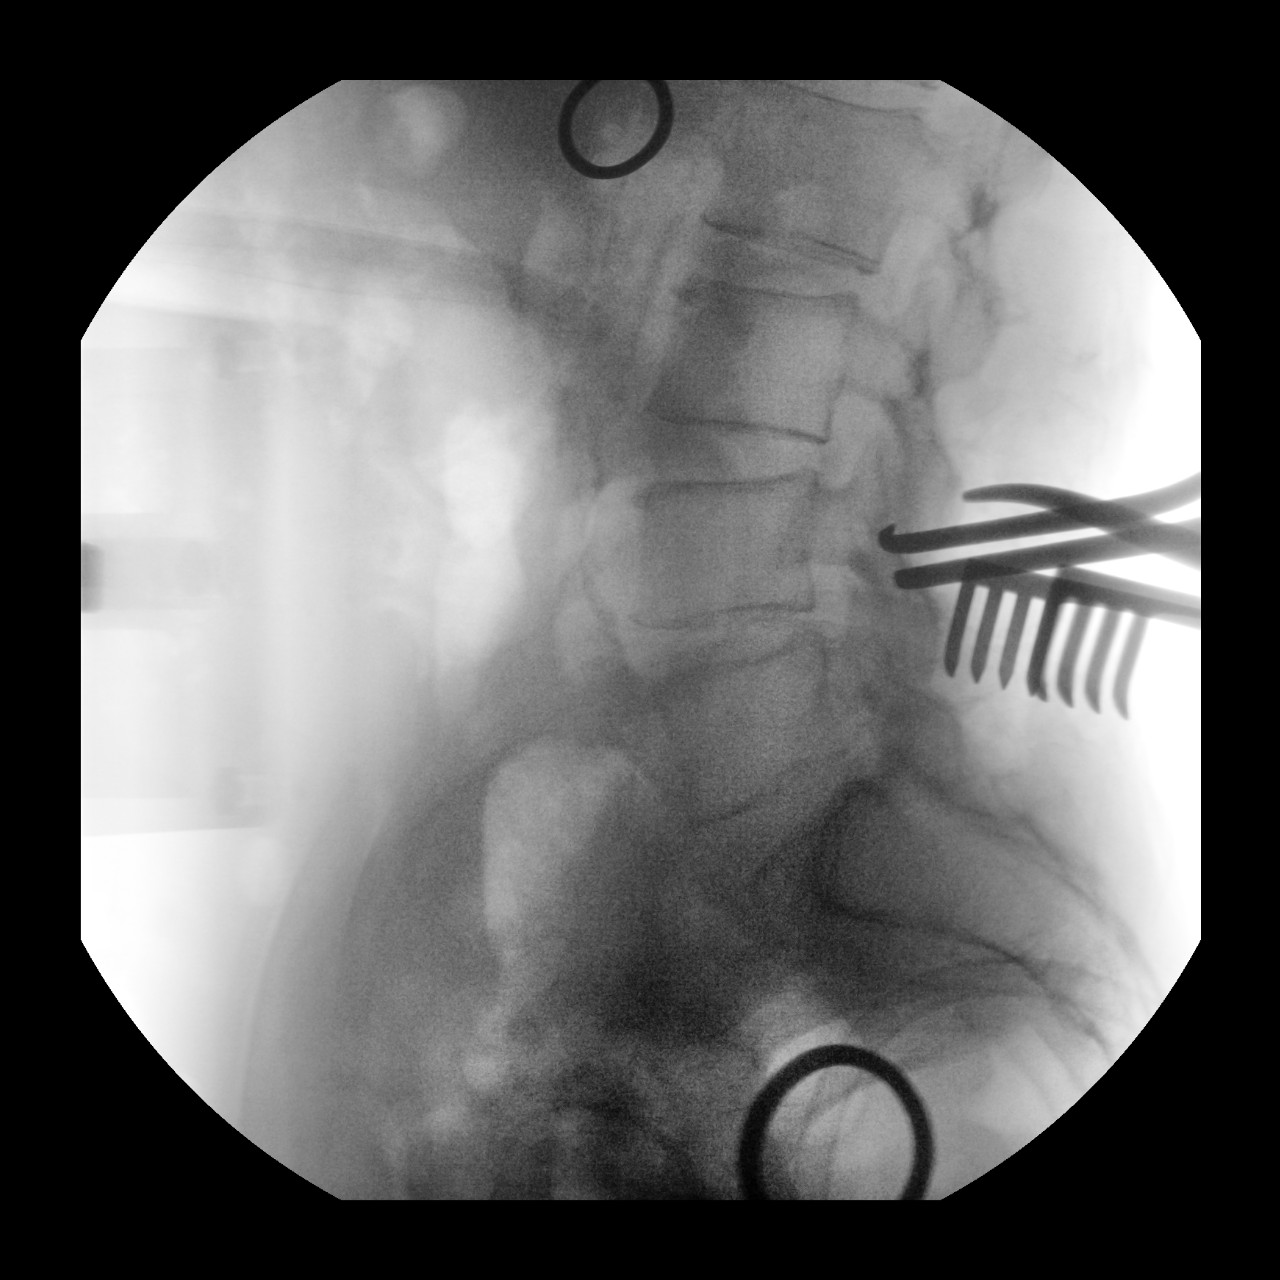
[im 2/3]
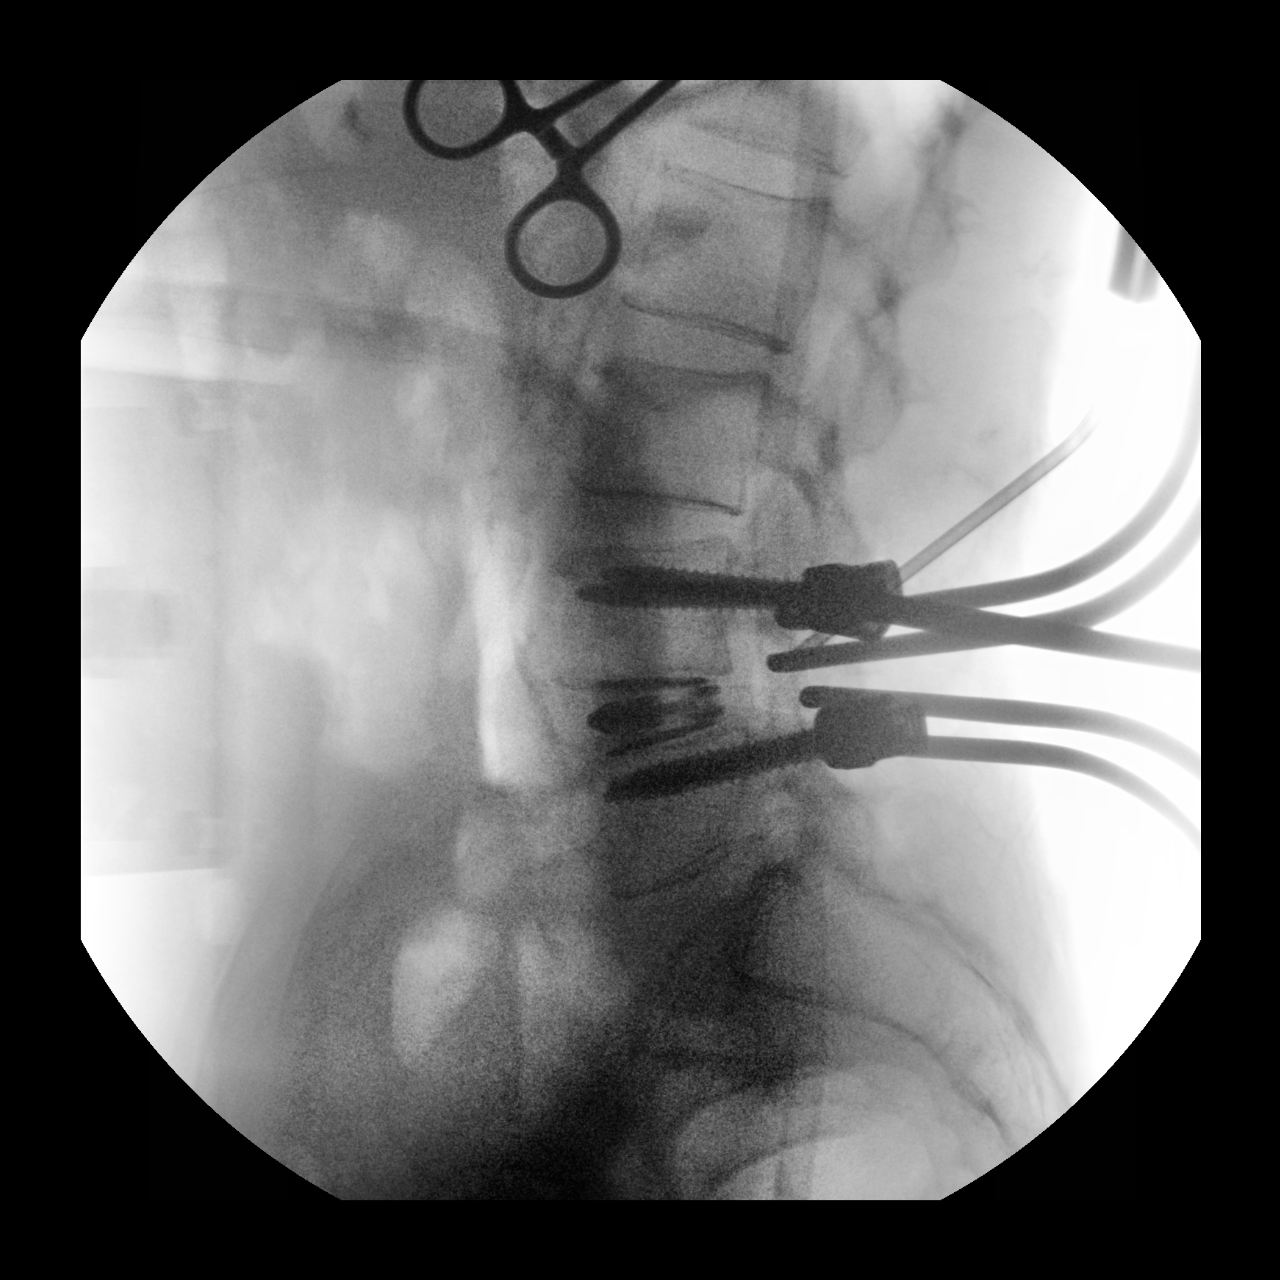
[im 3/3]
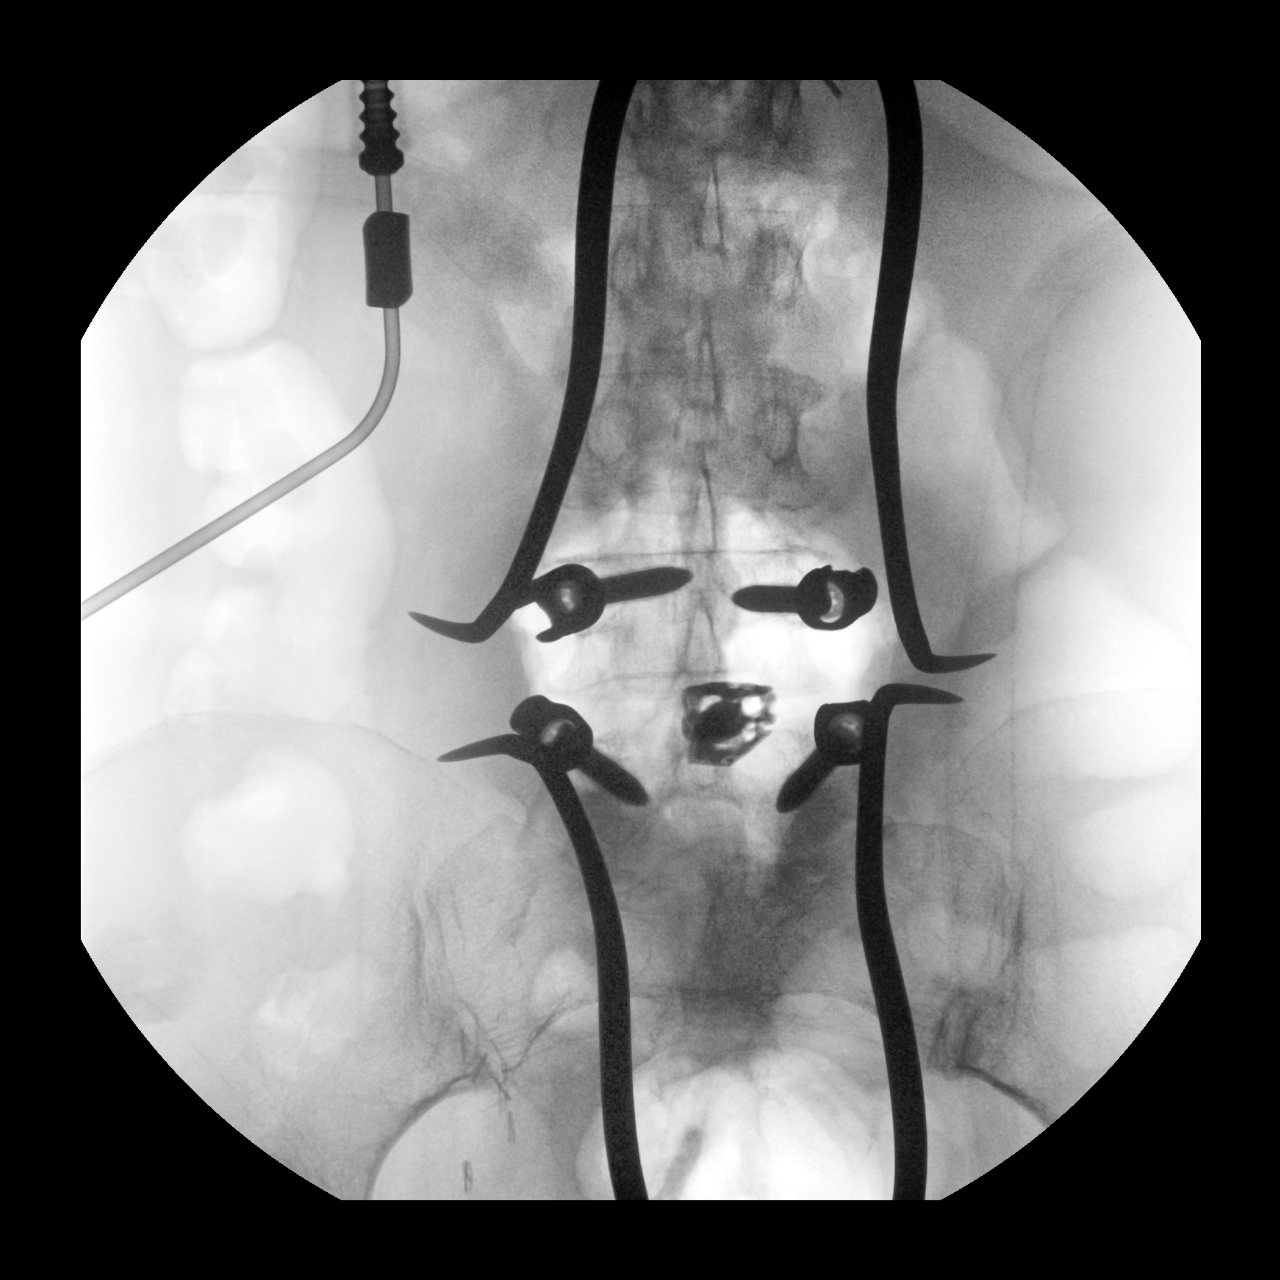

[3 of 3 positions shown; findings below may reference images not displayed]

FINDINGS: Three intraoperative views demonstrate placement of trans pedicle
screws and interbody fusion material at L4-5.
IMPRESSION: Intraoperative imaging of L4-5 fixation.

## 2023-10-13 ENCOUNTER — Telehealth: Payer: Self-pay

## 2023-10-13 NOTE — Telephone Encounter (Signed)
Transition Care Management Unsuccessful Follow-up Telephone Call  Date of discharge and from where:  [place] 11/11  Attempts:  2nd Attempt  Reason for unsuccessful TCM follow-up call:  No answer/busy   Lenard Forth [place]  [facility], [facility] Guide, Phone: [phone] Website: [email or web address]

## 2023-10-15 ENCOUNTER — Encounter: Payer: Self-pay | Admitting: Internal Medicine

## 2023-10-20 ENCOUNTER — Telehealth (INDEPENDENT_AMBULATORY_CARE_PROVIDER_SITE_OTHER): Payer: Medicare Other | Admitting: Internal Medicine

## 2023-10-20 ENCOUNTER — Encounter: Payer: Self-pay | Admitting: Internal Medicine

## 2023-10-20 VITALS — Ht 63.0 in | Wt 83.0 lb

## 2023-10-20 DIAGNOSIS — J4489 Other specified chronic obstructive pulmonary disease: Secondary | ICD-10-CM | POA: Diagnosis not present

## 2023-10-20 DIAGNOSIS — I700 Atherosclerosis of aorta: Secondary | ICD-10-CM | POA: Diagnosis not present

## 2023-10-20 DIAGNOSIS — E441 Mild protein-calorie malnutrition: Secondary | ICD-10-CM

## 2023-10-20 DIAGNOSIS — D751 Secondary polycythemia: Secondary | ICD-10-CM | POA: Diagnosis not present

## 2023-10-20 DIAGNOSIS — R1319 Other dysphagia: Secondary | ICD-10-CM

## 2023-10-20 DIAGNOSIS — Z903 Acquired absence of stomach [part of]: Secondary | ICD-10-CM

## 2023-10-20 DIAGNOSIS — R636 Underweight: Secondary | ICD-10-CM | POA: Diagnosis not present

## 2023-10-20 DIAGNOSIS — K50912 Crohn's disease, unspecified, with intestinal obstruction: Secondary | ICD-10-CM

## 2023-10-20 NOTE — Telephone Encounter (Signed)
noted 

## 2023-10-20 NOTE — Progress Notes (Unsigned)
Virtual Visit via Caregility   Note   This format is felt to be most appropriate for this patient at this time.  All issues noted in this document were discussed and addressed.  No physical exam was performed (except for noted visual exam findings with Video Visits).   I connected with  Tamara [name]  on  10/20/23 at  5:00 PM EST by a video enabled telemedicine application  and verified that I am speaking with the correct person using two identifiers. Location patient: home Location provider: work or home office Persons participating in the virtual visit: patient, provider  I discussed the limitations, risks, security and privacy concerns of performing an evaluation and management service by telephone and the availability of in person appointments. I also discussed with the patient that there may be a patient responsible charge related to this service. The patient expressed understanding and agreed to proceed.  Reason for visit: Gi issues    HPI:  Tamara [name] is a 77 yr old female with a history of Crohn's disease resulting in intestinal obstruction,  malnutrition, GERD , hiatal hernia with histor yof Nissen funcuplication reversal in 2019 by Gen Surg [place] ,  and chronic  abdominal pain stricture who presents today with request for referral to prior surgeon   She was recently seen by [facility] for worsening abdominal pain and was treated for chronic abdominal pain after serologies were negative for Crohn's flare.   She states that she has been having esophageal dysphagia with all solid food,  but has not regurgitated or vomited.  Her abdomen becomes bloated after every meal ad remains that way for 24 hours.  She has not been eating solid food.  Drinking gatorade and water.  Stools alternate between constipation and loose /liquid .    She states that after her surgery in 2019  her surgeon told her to return to him only; not to see other surgeons.   ROS: See pertinent positives and negatives per  HPI.  Past Medical History:  Diagnosis Date   Acute deep vein thrombosis (DVT) of left lower extremity (HCC) 05/2021   Acute deep vein thrombosis (DVT) of right lower extremity (HCC) 01/2007   a.) occurred postoperatively; s/p IVC filter placement   Acute deep vein thrombosis (DVT) of right lower extremity (HCC) 04/24/2018   Amaurosis fugax 12/13/2014    Brain MRA suggested vertebral artery partial occlusion   75 to 90% stenosed. Will treat for prevention of embolic stroke,  Refer to [facility] for CTA and stent and refer to cardiology for ECHO    Anticoagulated on Coumadin 07/06/2021   New regimen 6 mg 5 days per week,  5 mg 2 days per week given INR of 1.9  On 5/6alternating         Lab Results  Component  Value  Date     INR  1.9 (H)  07/06/2021     INR  2.9 (H)  06/18/2021     INR  1.6 (H)  06/11/2021     3.1 on 12/06/21 INR, held one dose of coumadin since on 5 additional days of augmentin, she has restarted her current dose of Coumadin 5mg  tablet once with coumadin 1mg  table   Anxiety    a.) on BZO (clonazepam) PRN   Aortic atherosclerosis (HCC)    Arthritis    B12 deficiency    Carpal tunnel syndrome    Carpal tunnel syndrome 03/08/2014   Cataracts, bilateral    Clostridium difficile  infection 05/24/2021   COPD (chronic obstructive pulmonary disease) (HCC)    no inhalers   Crohn's disease (HCC)    Depression    Dog bite 12/06/2021   Dysphagia 12/04/2017   Facial twitching 07/11/2018   GERD (gastroesophageal reflux disease)    History of cardiac catheterization 06/14/1997   a.) LHC 06/14/1997 at [facility]: EF 60%; normal coronaries; no obstructive CAD.   History of hiatal hernia    History of kidney stones    Hx of splenectomy    a.) reports spleen was "ruptured" during routine colonoscopy in [place], which necessitated surgical intervention   Intractable chronic migraine without aura and without status migrainosus 05/20/2019   Leukocytosis    Long term current use of  anticoagulant    a.) warfarin   Moderate tricuspid insufficiency    MRSA colonization 12/2016   Osteoporosis    Pneumonia    Reflux esophagitis 01/24/2018   Small bowel obstruction (HCC)    a.) 2001, 2004 (reversal of jejunal bypass). b.) s/p laparotomy and lysis of adhesions 01/2007   Spinal stenosis of lumbar region    Vitamin D deficiency     Past Surgical History:  Procedure Laterality Date   ANAL SPHINCTER PROSTHESIS PLACEMENT     APPENDECTOMY     CARDIAC CATHETERIZATION Left 06/14/1997   Procedure: CARDIAC CATHETERIZATION; Location: [facility]; Surgeon: Lieutenant Diego, MD   CARPAL TUNNEL RELEASE Right 04/22/2014   CARPAL TUNNEL RELEASE Left    CATARACT EXTRACTION Bilateral    CERVICAL FUSION  2005   C6-7, anterior approach   CHOLECYSTECTOMY     COLONOSCOPY     2005, 2007, 2010, 2015, 2020, 2021   COLONOSCOPY WITH PROPOFOL N/A 05/10/2019   Procedure: COLONOSCOPY WITH PROPOFOL;  Surgeon: Scot Jun, MD;  Location: [facility] ENDOSCOPY;  Service: Endoscopy;  Laterality: N/A;   ESOPHAGEAL MANOMETRY N/A 02/11/2018   Procedure: ESOPHAGEAL MANOMETRY (EM);  Surgeon: Midge Minium, MD;  Location: [facility] ENDOSCOPY;  Service: Endoscopy;  Laterality: N/A;   ESOPHAGOGASTRODUODENOSCOPY     1996, 2006, 2008, 2010, 2014, 2018, 2021   ESOPHAGOGASTRODUODENOSCOPY (EGD) WITH PROPOFOL N/A 12/17/2017   Procedure: ESOPHAGOGASTRODUODENOSCOPY (EGD) WITH PROPOFOL;  Surgeon: Earline Mayotte, MD;  Location: [facility] ENDOSCOPY;  Service: Endoscopy;  Laterality: N/A;   ESOPHAGOGASTRODUODENOSCOPY (EGD) WITH PROPOFOL N/A 01/08/2018   Procedure: ESOPHAGOGASTRODUODENOSCOPY (EGD) WITH PROPOFOL;  Surgeon: Earline Mayotte, MD;  Location: [facility] ENDOSCOPY;  Service: Endoscopy;  Laterality: N/A;   EXCISION NEUROMA Left 09/18/2021   Procedure: Excision of traumatic neuroma infrapatellar branch saphenous nerve left knee.;  Surgeon: Christena Flake, MD;  Location: [facility] ORS;  Service: Orthopedics;  Laterality: Left;   GIVENS  CAPSULE STUDY     2005, 2006   INSERTION OF VENA CAVA FILTER Right    KNEE ARTHROSCOPY WITH MEDIAL MENISECTOMY Left 06/22/2020   Procedure: Left knee arthroscopic partial medial meniscectomy;  Surgeon: Signa Kell, MD;  Location: [facility];  Service: Orthopedics;  Laterality: Left;   KNEE ARTHROSCOPY WITH MEDIAL MENISECTOMY Left 05/25/2021   Procedure: Left knee arthroscopy, infrapatellar fat pad excision;  Surgeon: Signa Kell, MD;  Location: [facility] ORS;  Service: Orthopedics;  Laterality: Left;   LAPAROTOMY  01/30/2007   for bowel obstruction with LOA   LUMBAR LAMINECTOMY/DECOMPRESSION MICRODISCECTOMY N/A 12/25/2016   Procedure: LUMBAR DECOMPRESSION L4-5;  Surgeon: Venetia Night, MD;  Location: [facility] ORS;  Service: Neurosurgery;  Laterality: N/A;   NISSEN FUNDOPLICATION     ROTATOR CUFF REPAIR Bilateral    SHOULDER ARTHROSCOPY WITH SUBACROMIAL DECOMPRESSION,  ROTATOR CUFF REPAIR AND BICEP TENDON REPAIR Left 03/04/2023   Procedure: LEFT SHOULDER ARTHROSCOPY WITH DEBRIDEMENT, DECOMPRESSION, ROATOR CUFF REPAIR, AND POSSIBLE BICEPS TENODESIS. - RNFA;  Surgeon: Christena Flake, MD;  Location: [facility] ORS;  Service: Orthopedics;  Laterality: Left;   SPLENECTOMY     secondary to colonoscopy   TONSILLECTOMY AND ADENOIDECTOMY     TRANSFORAMINAL LUMBAR INTERBODY FUSION (TLIF) WITH PEDICLE SCREW FIXATION 1 LEVEL N/A 04/01/2022   Procedure: OPEN L4-5 TRANSFORAMINAL LUMBAR INTERBODY FUSION (TLIF);  Surgeon: Venetia Night, MD;  Location: [facility] ORS;  Service: Neurosurgery;  Laterality: N/A;   TRIGGER FINGER RELEASE Left 08/25/2015   TRIGGER FINGER RELEASE Right 09/18/2015   VAGINAL HYSTERECTOMY      Family History  Problem Relation Age of Onset   Diabetes Mother        type 2   Heart disease Mother    Hypertension Mother    Coronary artery disease Father    Heart attack Sister     SOCIAL HX:  reports that she has been smoking cigarettes. She started smoking about 16 months ago. She has  a 50.4 pack-year smoking history. She has never used smokeless tobacco. She reports that she does not drink alcohol and does not use drugs.    Current Outpatient Medications:    albuterol (VENTOLIN HFA) 108 (90 Base) MCG/ACT inhaler, Inhale 2 puffs into the lungs every 4 (four) hours as needed., Disp: 18 g, Rfl: 0   clonazePAM (KLONOPIN) 0.5 MG tablet, Take 1 tablet (0.5 mg total) by mouth daily as needed for anxiety., Disp: 30 tablet, Rfl: 5   cyanocobalamin (VITAMIN B12) 1000 MCG/ML injection, USE 1 ML INTO MUSCLE EVERY 30 DAY AS DIRECTED, Disp: 10 mL, Rfl: 2   dicyclomine (BENTYL) 10 MG capsule, Take 10 mg by mouth 3 (three) times daily before meals., Disp: , Rfl:    nortriptyline (PAMELOR) 25 MG capsule, TAKE 1 CAPSULE BY MOUTH AT BEDTIME, Disp: 90 capsule, Rfl: 1   omeprazole (PRILOSEC) 40 MG capsule, Take 40 mg by mouth daily., Disp: , Rfl:    ondansetron (ZOFRAN-ODT) 4 MG disintegrating tablet, Take 4 mg by mouth every 8 (eight) hours as needed., Disp: , Rfl:    rosuvastatin (CRESTOR) 10 MG tablet, TAKE 1 TABLET BY MOUTH AT BEDTIME, Disp: 90 tablet, Rfl: 1   sertraline (ZOLOFT) 100 MG tablet, TAKE 1 TABLET BY MOUTH ONCE DAILY, Disp: 90 tablet, Rfl: 1   Spacer/Aero-Holding Chambers (AEROCHAMBER MV) inhaler, Use as instructed, Disp: 1 each, Rfl: 2   traZODone (DESYREL) 100 MG tablet, TAKE 1 TABLET BY MOUTH AT BEDTIME, Disp: 90 tablet, Rfl: 0  EXAM:  VITALS per patient if applicable:  GENERAL: alert, oriented, appears well and in no acute distress  HEENT: atraumatic, conjunttiva clear, no obvious abnormalities on inspection of external nose and ears  NECK: normal movements of the head and neck  LUNGS: on inspection no signs of respiratory distress, breathing rate appears normal, no obvious gross SOB, gasping or wheezing  CV: no obvious cyanosis  MS: moves all visible extremities without noticeable abnormality  PSYCH/NEURO: pleasant and cooperative, no obvious depression or  anxiety, speech and thought processing grossly intact  ASSESSMENT AND PLAN: Esophageal dysphagia -     Ambulatory referral to General Surgery  S/P subtotal gastrectomy -     Ambulatory referral to General Surgery  Underweight Assessment & Plan: She refuses to regard her low body weight as pathologic and does not want to gain weight.  She is not  bulemic or anorexic.  Advised her to supplement diet with Boost or Ensure as she is a poor candidate for tissue healing at current nutritional state    Polycythemia, secondary Assessment & Plan: Secondary to tobacco abuse   Lab Results  Component Value Date   WBC 8.2 09/22/2023   HGB 13.7 09/22/2023   HCT 41.2 09/22/2023   MCV 102.5 (H) 09/22/2023   PLT 231 09/22/2023      Mild protein-calorie malnutrition (HCC) Assessment & Plan:  He has lost several lbs over the last 4 weeks due to recurrent post prandial abdominal pain .  Referring to Gen Surg as requested to rule out EGJ obstruction    Emphysema with chronic bronchitis (HCC) Assessment & Plan: Secondary  to ongoing tobacco abuse.  she is asymptomatic currently and have been prescribed maintenance doses of Spiriva and Symbicort   Crohn's disease of intestine, with intestinal obstruction [facility]) Assessment & Plan: Her disease has been  managed by Dr Norma Fredrickson,  Last colonoscopy done in 2021. Her current pain complaint is atypical for Crohn's flare and serologies were normal.  Referring to dr Lorin Picket per patient preference .   Aortic atherosclerosis (HCC) Assessment & Plan: Reviewed her need for statin use due to  findings of prior CT scan today..  Patient is tolerating statin therapy with 10 mg crestor once daily and LDL is at goal.  LFTS are normal   Lab Results  Component Value Date   CHOL 128 06/03/2023   HDL 72 06/03/2023   LDLCALC 31 06/03/2023   LDLDIRECT 43.0 09/12/2022   TRIG 174 (H) 06/03/2023   CHOLHDL 1.8 06/03/2023     Lab Results  Component Value Date    ALT 45 (H) 09/22/2023   AST 35 09/22/2023   ALKPHOS 42 09/22/2023   BILITOT 0.2 09/22/2023          I discussed the assessment and treatment plan with the patient. The patient was provided an opportunity to ask questions and all were answered. The patient agreed with the plan and demonstrated an understanding of the instructions.   The patient was advised to call back or seek an in-person evaluation if the symptoms worsen or if the condition fails to improve as anticipated.   I spent 30 minutes dedicated to the care of this patient on the date of this encounter to include pre-visit review of his medical history,  Face-to-face time with the patient , and post visit ordering of testing and therapeutics.    Sherlene Shams, MD

## 2023-10-21 ENCOUNTER — Other Ambulatory Visit: Payer: Self-pay | Admitting: Internal Medicine

## 2023-10-21 NOTE — Assessment & Plan Note (Signed)
Secondary to tobacco abuse   Lab Results  Component Value Date   WBC 8.2 09/22/2023   HGB 13.7 09/22/2023   HCT 41.2 09/22/2023   MCV 102.5 (H) 09/22/2023   PLT 231 09/22/2023

## 2023-10-21 NOTE — Assessment & Plan Note (Signed)
Her disease has been  managed by Dr Norma Fredrickson,  Last colonoscopy done in 2021. Her current pain complaint is atypical for Crohn's flare and serologies were normal.  Referring to dr Lorin Picket per patient preference .

## 2023-10-21 NOTE — Assessment & Plan Note (Signed)
Reviewed her need for statin use due to  findings of prior CT scan today..  Patient is tolerating statin therapy with 10 mg crestor once daily and LDL is at goal.  LFTS are normal   Lab Results  Component Value Date   CHOL 128 06/03/2023   HDL 72 06/03/2023   LDLCALC 31 06/03/2023   LDLDIRECT 43.0 09/12/2022   TRIG 174 (H) 06/03/2023   CHOLHDL 1.8 06/03/2023     Lab Results  Component Value Date   ALT 45 (H) 09/22/2023   AST 35 09/22/2023   ALKPHOS 42 09/22/2023   BILITOT 0.2 09/22/2023

## 2023-10-21 NOTE — Assessment & Plan Note (Signed)
He has lost several lbs over the last 4 weeks due to recurrent post prandial abdominal pain .  Referring to Gen Surg as requested to rule out EGJ obstruction

## 2023-10-21 NOTE — Assessment & Plan Note (Signed)
She refuses to regard her low body weight as pathologic and does not want to gain weight.  She is not bulemic or anorexic.  Advised her to supplement diet with Boost or Ensure as she is a poor candidate for tissue healing at current nutritional state

## 2023-10-21 NOTE — Assessment & Plan Note (Signed)
Secondary  to ongoing tobacco abuse.  she is asymptomatic currently and have been prescribed maintenance doses of Spiriva and Symbicort

## 2023-10-25 ENCOUNTER — Other Ambulatory Visit: Payer: Self-pay | Admitting: Internal Medicine

## 2023-10-29 IMAGING — US US EXTREM LOW VENOUS*R*
1 series · 14 of 24 positions shown · non-contrast
Comparison: Right lower extremity Doppler ultrasound 07/21/2007

CLINICAL DATA: Right leg pain.  Recent surgery.  History of DVT.

EXAM:
RIGHT LOWER EXTREMITY VENOUS DOPPLER ULTRASOUND
TECHNIQUE: Gray-scale sonography with compression, as well as color and duplex
ultrasound, were performed to evaluate the deep venous system(s)
from the level of the common femoral vein through the popliteal and
proximal calf veins.

[Series 1: us venous img lower uni right (dvt) · portal-venous · 14 of 39 slices shown]
[im 1/39]
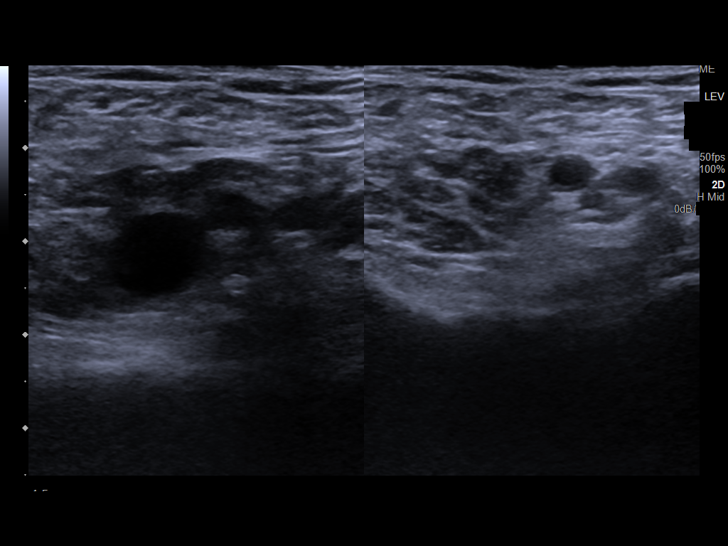
[im 4/39]
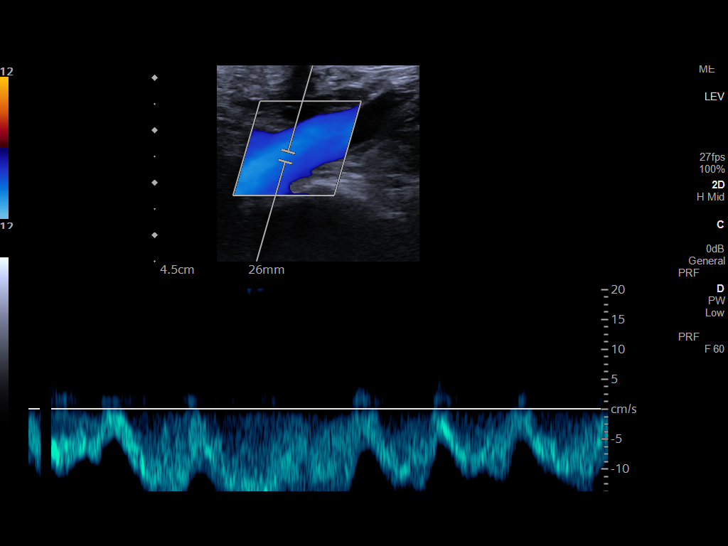
[im 7/39]
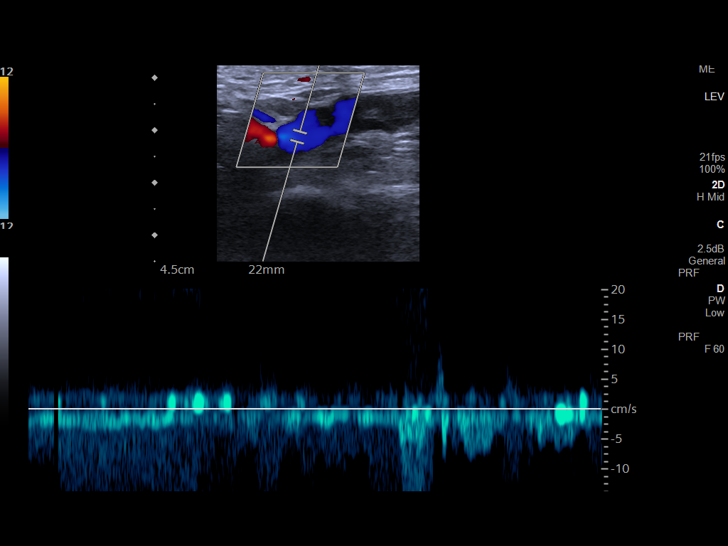
[im 10/39]
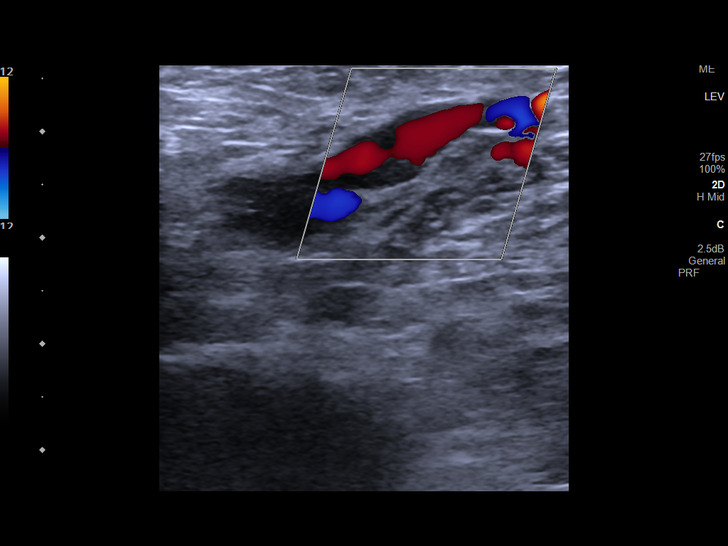
[im 12/39]
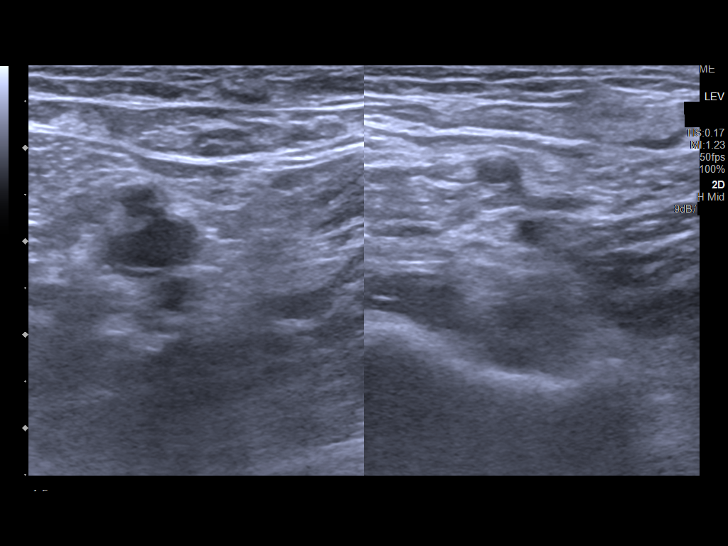
[im 15/39]
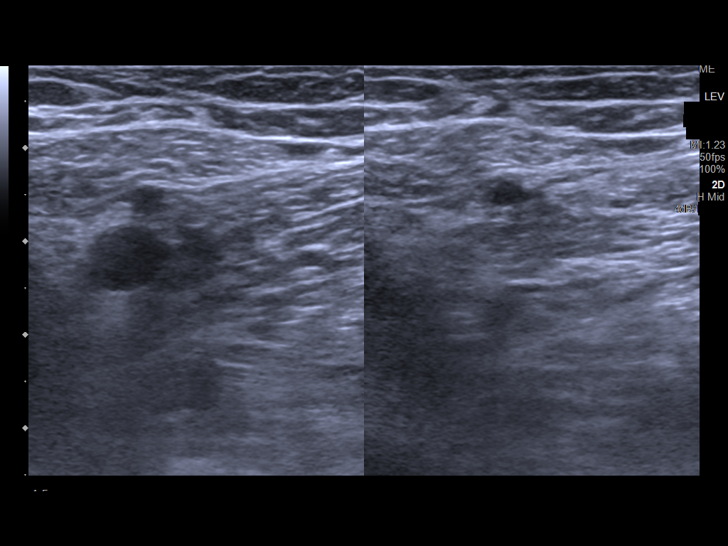
[im 19/39]
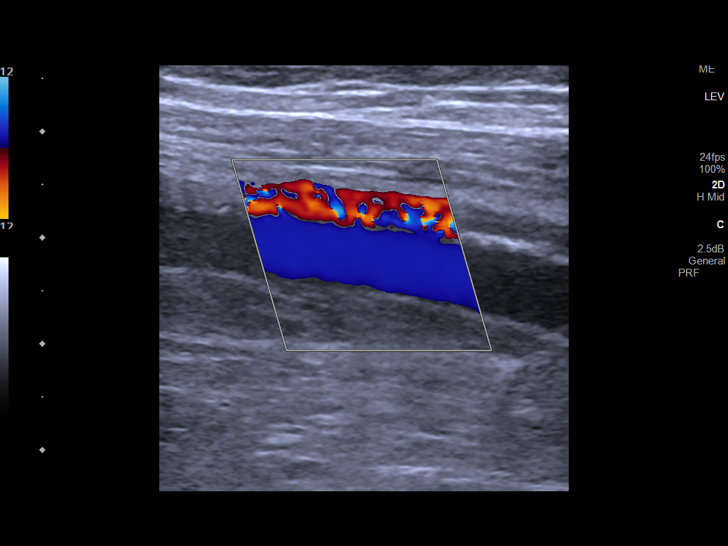
[im 20/39]
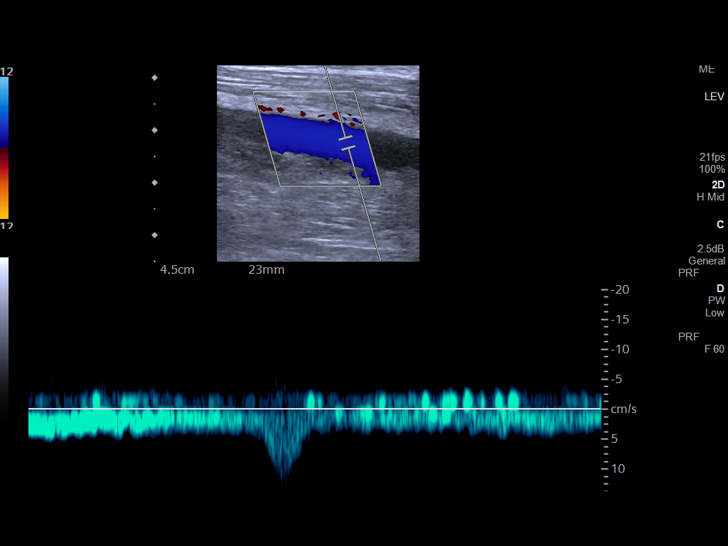
[im 24/39]
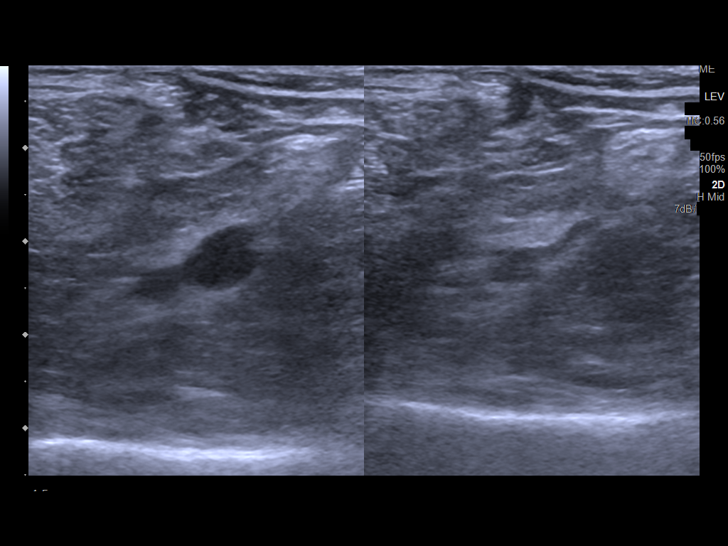
[im 27/39]
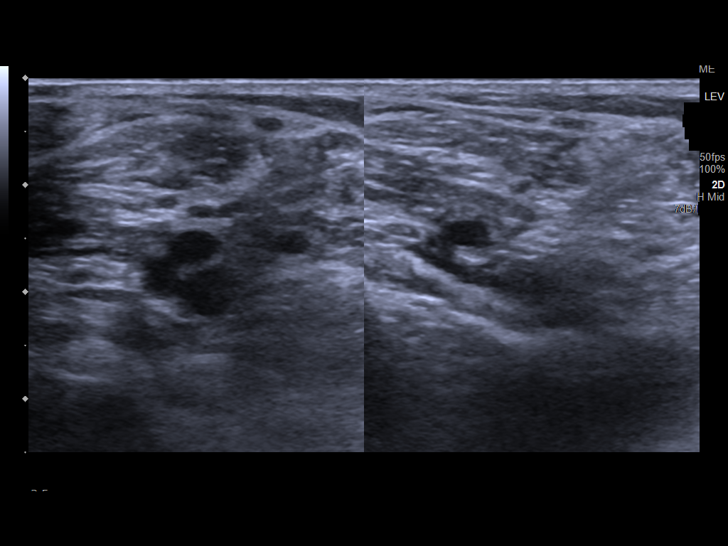
[im 30/39]
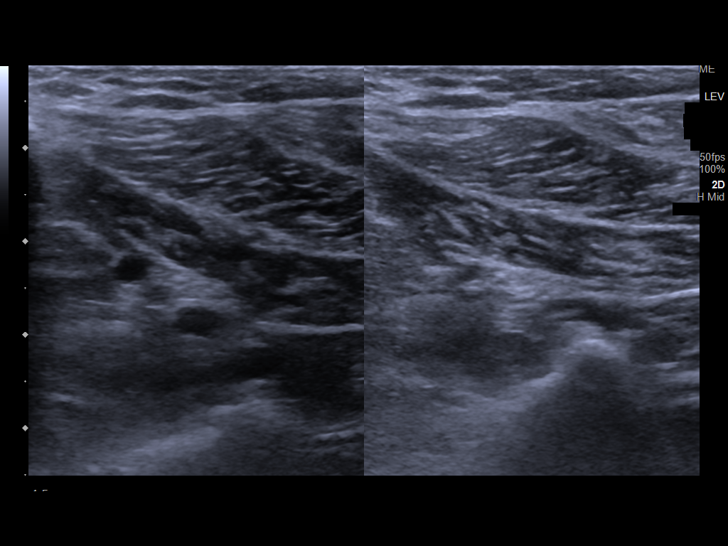
[im 32/39]
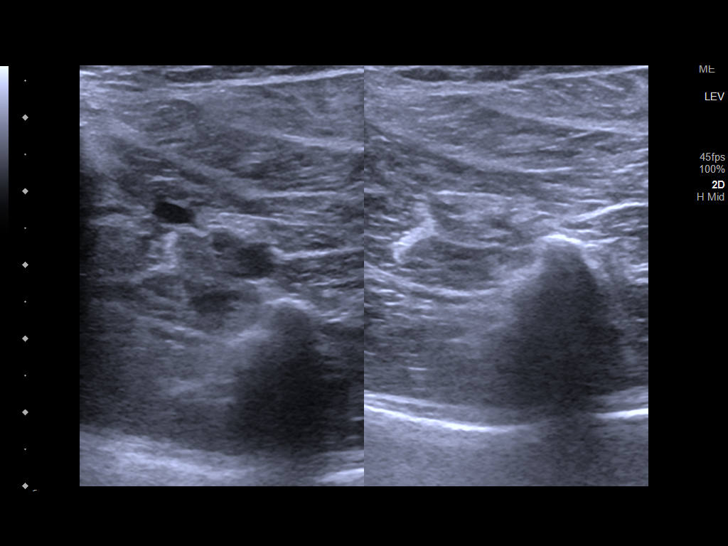
[im 35/39]
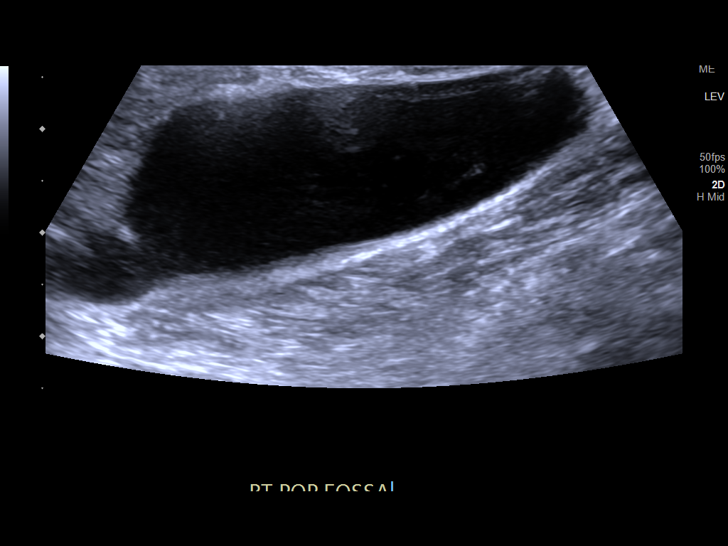
[im 39/39]
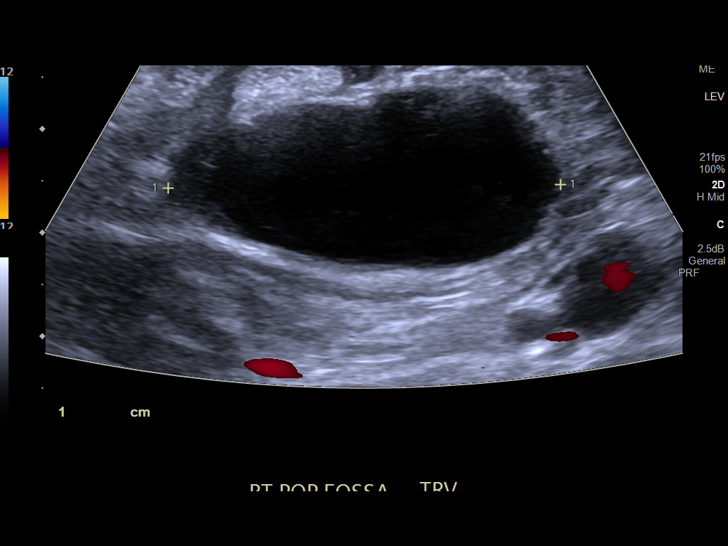

[14 of 24 positions shown; findings below may reference images not displayed]

FINDINGS: VENOUS

Normal compressibility of the common femoral, superficial femoral,
and popliteal veins, as well as the visualized calf veins.
Visualized portions of profunda femoral vein and great saphenous
vein unremarkable. No filling defects to suggest DVT on grayscale or
color Doppler imaging. Doppler waveforms show normal direction of
venous flow, normal respiratory plasticity and response to
augmentation.

Limited views of the contralateral common femoral vein are
unremarkable.

OTHER

5.4 x 1.7 x 3.8 cm cyst in the right popliteal fossa.

Limitations: none
IMPRESSION: 1. No evidence of deep venous thrombosis in the right lower
extremity.
2. 5.4 cm right Baker's cyst.

## 2023-11-25 ENCOUNTER — Other Ambulatory Visit: Payer: Self-pay | Admitting: Surgery

## 2023-11-25 DIAGNOSIS — M1711 Unilateral primary osteoarthritis, right knee: Secondary | ICD-10-CM | POA: Diagnosis not present

## 2023-11-27 ENCOUNTER — Encounter
Admission: RE | Admit: 2023-11-27 | Discharge: 2023-11-27 | Disposition: A | Discharge status: Home / Self Care | Payer: Medicare HMO | Source: Ambulatory Visit | Attending: Surgery | Admitting: Surgery

## 2023-11-27 ENCOUNTER — Other Ambulatory Visit: Payer: Self-pay

## 2023-11-27 VITALS — BP 133/71 | HR 101 | Temp 98.0°F | Resp 16 | Ht 63.0 in | Wt 87.7 lb

## 2023-11-27 DIAGNOSIS — R8290 Unspecified abnormal findings in urine: Secondary | ICD-10-CM | POA: Diagnosis not present

## 2023-11-27 DIAGNOSIS — Z01818 Encounter for other preprocedural examination: Secondary | ICD-10-CM | POA: Insufficient documentation

## 2023-11-27 DIAGNOSIS — Z01810 Encounter for preprocedural cardiovascular examination: Secondary | ICD-10-CM | POA: Diagnosis not present

## 2023-11-27 DIAGNOSIS — Z87440 Personal history of urinary (tract) infections: Secondary | ICD-10-CM | POA: Diagnosis not present

## 2023-11-27 DIAGNOSIS — Z01812 Encounter for preprocedural laboratory examination: Secondary | ICD-10-CM

## 2023-11-27 HISTORY — DX: Cerebral infarction, unspecified: I63.9

## 2023-11-27 HISTORY — DX: Chronic migraine without aura, intractable, with status migrainosus: G43.711

## 2023-11-27 HISTORY — DX: Melena: K92.1

## 2023-11-27 LAB — CBC WITH DIFFERENTIAL/PLATELET
Abs Immature Granulocytes: 0.01 10*3/uL (ref 0.00–0.07)
Basophils Absolute: 0.1 10*3/uL (ref 0.0–0.1)
Basophils Relative: 1 %
Eosinophils Absolute: 0 10*3/uL (ref 0.0–0.5)
Eosinophils Relative: 1 %
HCT: 36.4 % (ref 36.0–46.0)
Hemoglobin: 12.4 g/dL (ref 12.0–15.0)
Immature Granulocytes: 0 %
Lymphocytes Relative: 48 %
Lymphs Abs: 4.2 10*3/uL — ABNORMAL HIGH (ref 0.7–4.0)
MCH: 34.8 pg — ABNORMAL HIGH (ref 26.0–34.0)
MCHC: 34.1 g/dL (ref 30.0–36.0)
MCV: 102.2 fL — ABNORMAL HIGH (ref 80.0–100.0)
Monocytes Absolute: 0.9 10*3/uL (ref 0.1–1.0)
Monocytes Relative: 11 %
Neutro Abs: 3.4 10*3/uL (ref 1.7–7.7)
Neutrophils Relative %: 39 %
Platelets: 254 10*3/uL (ref 150–400)
RBC: 3.56 MIL/uL — ABNORMAL LOW (ref 3.87–5.11)
RDW: 14.4 % (ref 11.5–15.5)
WBC: 8.6 10*3/uL (ref 4.0–10.5)
nRBC: 0 % (ref 0.0–0.2)

## 2023-11-27 LAB — COMPREHENSIVE METABOLIC PANEL
ALT: 33 U/L (ref 0–44)
AST: 32 U/L (ref 15–41)
Albumin: 4.1 g/dL (ref 3.5–5.0)
Alkaline Phosphatase: 45 U/L (ref 38–126)
Anion gap: 8 (ref 5–15)
BUN: 10 mg/dL (ref 8–23)
CO2: 21 mmol/L — ABNORMAL LOW (ref 22–32)
Calcium: 8.8 mg/dL — ABNORMAL LOW (ref 8.9–10.3)
Chloride: 110 mmol/L (ref 98–111)
Creatinine, Ser: 0.7 mg/dL (ref 0.44–1.00)
GFR, Estimated: >60 mL/min (ref >60)
Glucose, Bld: 62 mg/dL — ABNORMAL LOW (ref 70–99)
Potassium: 3.5 mmol/L (ref 3.5–5.1)
Sodium: 139 mmol/L (ref 135–145)
Total Bilirubin: 0.4 mg/dL (ref 0.0–1.2)
Total Protein: 6.2 g/dL — ABNORMAL LOW (ref 6.5–8.1)

## 2023-11-27 LAB — URINALYSIS, ROUTINE W REFLEX MICROSCOPIC
Bilirubin Urine: NEGATIVE
Glucose, UA: 50 mg/dL — AB
Hgb urine dipstick: NEGATIVE
Ketones, ur: NEGATIVE mg/dL
Leukocytes,Ua: NEGATIVE
Nitrite: POSITIVE — AB
Protein, ur: NEGATIVE mg/dL
Specific Gravity, Urine: 1.008 (ref 1.005–1.030)
Squamous Epithelial / HPF: 0 /[HPF] (ref 0–5)
pH: 5 (ref 5.0–8.0)

## 2023-11-27 LAB — SURGICAL PCR SCREEN
MRSA, PCR: NEGATIVE
Staphylococcus aureus: NEGATIVE

## 2023-11-27 NOTE — Patient Instructions (Addendum)
Your procedure is scheduled on: Tuesday 12/02/23  Report to the Registration Desk on the 1st floor of the [facility]. To find out your arrival time, please call [phone] between 1PM - 3PM on: Monday 12/01/23  If your arrival time is 6:00 am, do not arrive before that time as the [facility] entrance doors do not open until 6:00 am.  REMEMBER: Instructions that are not followed completely may result in serious medical risk, up to and including death; or upon the discretion of your surgeon and anesthesiologist your surgery may need to be rescheduled.  Do not eat food after midnight the night before surgery.  No gum chewing or hard candies.  You may however, drink CLEAR liquids up to 2 hours before you are scheduled to arrive for your surgery. Do not drink anything within 2 hours of your scheduled arrival time.  Clear liquids include: - water  - apple juice without pulp - gatorade (not RED colors) - black coffee or tea (Do NOT add milk or creamers to the coffee or tea) Do NOT drink anything that is not on this list.  In addition, your doctor has ordered for you to drink the provided:  Ensure Pre-Surgery Clear Carbohydrate Drink  Drinking this carbohydrate drink up to two hours before surgery helps to reduce insulin resistance and improve patient outcomes. Please complete drinking 2 hours before scheduled arrival time.  One week prior to surgery: Stop Anti-inflammatories (NSAIDS) such as Advil, Aleve, Ibuprofen, Motrin, Naproxen, Naprosyn and Aspirin based products such as Excedrin, Goody's Powder, BC Powder. Stop ANY OVER THE COUNTER supplements until after surgery.  You may however, continue to take Tylenol if needed for pain up until the day of surgery.   Continue taking all of your other prescription medications up until the day of surgery.  ON THE DAY OF SURGERY ONLY TAKE THESE MEDICATIONS WITH SIPS OF WATER:  omeprazole (PRILOSEC) 40 MG   Use inhalers on the day of  surgery and bring to the hospital.   No Alcohol for 24 hours before or after surgery.  No Smoking including e-cigarettes for 24 hours before surgery.  No chewable tobacco products for at least 6 hours before surgery.  No nicotine patches on the day of surgery.  Do not use any "recreational" drugs for at least a week (preferably 2 weeks) before your surgery.  Please be advised that the combination of cocaine and anesthesia may have negative outcomes, up to and including death. If you test positive for cocaine, your surgery will be cancelled.  On the morning of surgery brush your teeth with toothpaste and water, you may rinse your mouth with mouthwash if you wish. Do not swallow any toothpaste or mouthwash.  Use CHG Soap or wipes as directed on instruction sheet.  Do not wear jewelry, make-up, hairpins, clips or nail polish.  For welded (permanent) jewelry: bracelets, anklets, waist bands, etc.  Please have this removed prior to surgery.  If it is not removed, there is a chance that hospital personnel will need to cut it off on the day of surgery.  Do not wear lotions, powders, or perfumes.   Do not shave body hair from the neck down 48 hours before surgery.  Contact lenses, hearing aids and dentures may not be worn into surgery.  Do not bring valuables to the hospital. [facility] is not responsible for any missing/lost belongings or valuables.   Total Shoulder Arthroplasty:  use Benzoyl Peroxide 5% Gel as directed on instruction  sheet.  Bring your C-PAP to the hospital in case you may have to spend the night.   Notify your doctor if there is any change in your medical condition (cold, fever, infection).  Wear comfortable clothing (specific to your surgery type) to the hospital.  After surgery, you can help prevent lung complications by doing breathing exercises.  Take deep breaths and cough every 1-2 hours. Your doctor may order a device called an Incentive Spirometer to help  you take deep breaths. When coughing or sneezing, hold a pillow firmly against your incision with both hands. This is called "splinting." Doing this helps protect your incision. It also decreases belly discomfort.  If you are being admitted to the hospital overnight, leave your suitcase in the car. After surgery it may be brought to your room.  In case of increased patient census, it may be necessary for you, the patient, to continue your postoperative care in the Same Day Surgery department.  If you are being discharged the day of surgery, you will not be allowed to drive home. You will need a responsible individual to drive you home and stay with you for 24 hours after surgery.   If you are taking public transportation, you will need to have a responsible individual with you.  Please call the Pre-admissions Testing Dept. at [phone] if you have any questions about these instructions.  Surgery Visitation Policy:  Patients having surgery or a procedure may have two visitors.  Children under the age of 50 must have an adult with them who is not the patient.  Temporary Visitor Restrictions Due to increasing cases of flu, RSV and COVID-19: Children ages 46 and under will not be able to visit patients in [facility] hospitals under most circumstances.  Inpatient Visitation:    Visiting hours are 7 a.m. to 8 p.m. Up to four visitors are allowed at one time in a patient room. The visitors may rotate out with other people during the day. One visitor age 71 or older may stay with the patient overnight and must be in the room by 8 p.m.    Pre-operative 5 CHG Bath Instructions   You can play a key role in reducing the risk of infection after surgery. Your skin needs to be as free of germs as possible. You can reduce the number of germs on your skin by washing with CHG (chlorhexidine gluconate) soap before surgery. CHG is an antiseptic soap that kills germs and continues to kill germs  even after washing.   DO NOT use if you have an allergy to chlorhexidine/CHG or antibacterial soaps. If your skin becomes reddened or irritated, stop using the CHG and notify one of our RNs at [phone].   Please shower with the CHG soap starting 4 days before surgery using the following schedule:     Please keep in mind the following:  DO NOT shave, including legs and underarms, starting the day of your first shower.   You may shave your face at any point before/day of surgery.  Place clean sheets on your bed the day you start using CHG soap. Use a clean washcloth (not used since being washed) for each shower. DO NOT sleep with pets once you start using the CHG.   CHG Shower Instructions:  If you choose to wash your hair and private area, wash first with your normal shampoo/soap.  After you use shampoo/soap, rinse your hair and body thoroughly to remove shampoo/soap residue.  Turn the water  OFF and apply about 3 tablespoons (45 ml) of CHG soap to a CLEAN washcloth.  Apply CHG soap ONLY FROM YOUR NECK DOWN TO YOUR TOES (washing for 3-5 minutes)  DO NOT use CHG soap on face, private areas, open wounds, or sores.  Pay special attention to the area where your surgery is being performed.  If you are having back surgery, having someone wash your back for you may be helpful. Wait 2 minutes after CHG soap is applied, then you may rinse off the CHG soap.  Pat dry with a clean towel  Put on clean clothes/pajamas   If you choose to wear lotion, please use ONLY the CHG-compatible lotions on the back of this paper.     Additional instructions for the day of surgery: DO NOT APPLY any lotions, deodorants, cologne, or perfumes.   Put on clean/comfortable clothes.  Brush your teeth.  Ask your nurse before applying any prescription medications to the skin.      CHG Compatible Lotions   Aveeno Moisturizing lotion  Cetaphil Moisturizing Cream  Cetaphil Moisturizing Lotion  Clairol Herbal  Essence Moisturizing Lotion, Dry Skin  Clairol Herbal Essence Moisturizing Lotion, Extra Dry Skin  Clairol Herbal Essence Moisturizing Lotion, Normal Skin  Curel Age Defying Therapeutic Moisturizing Lotion with Alpha Hydroxy  Curel Extreme Care Body Lotion  Curel Soothing Hands Moisturizing Hand Lotion  Curel Therapeutic Moisturizing Cream, Fragrance-Free  Curel Therapeutic Moisturizing Lotion, Fragrance-Free  Curel Therapeutic Moisturizing Lotion, Original Formula  Eucerin Daily Replenishing Lotion  Eucerin Dry Skin Therapy Plus Alpha Hydroxy Crme  Eucerin Dry Skin Therapy Plus Alpha Hydroxy Lotion  Eucerin Original Crme  Eucerin Original Lotion  Eucerin Plus Crme Eucerin Plus Lotion  Eucerin TriLipid Replenishing Lotion  Keri Anti-Bacterial Hand Lotion  Keri Deep Conditioning Original Lotion Dry Skin Formula Softly Scented  Keri Deep Conditioning Original Lotion, Fragrance Free Sensitive Skin Formula  Keri Lotion Fast Absorbing Fragrance Free Sensitive Skin Formula  Keri Lotion Fast Absorbing Softly Scented Dry Skin Formula  Keri Original Lotion  Keri Skin Renewal Lotion Keri Silky Smooth Lotion  Keri Silky Smooth Sensitive Skin Lotion  Nivea Body Creamy Conditioning Oil  Nivea Body Extra Enriched Lotion  Nivea Body Original Lotion  Nivea Body Sheer Moisturizing Lotion Nivea Crme  Nivea Skin Firming Lotion  NutraDerm 30 Skin Lotion  NutraDerm Skin Lotion  NutraDerm Therapeutic Skin Cream  NutraDerm Therapeutic Skin Lotion  ProShield Protective Hand Cream  Provon moisturizing lotio  Please go to the following website to access important education materials concerning your upcoming joint replacement.                                   [email or web address] go to the following website to access important education materials concerning your upcoming joint replacement.   [email or web address]

## 2023-11-28 DIAGNOSIS — N390 Urinary tract infection, site not specified: Secondary | ICD-10-CM

## 2023-11-28 DIAGNOSIS — Z01812 Encounter for preprocedural laboratory examination: Secondary | ICD-10-CM

## 2023-11-28 MED ORDER — CIPROFLOXACIN HCL 500 MG PO TABS: 500.0000 mg | ORAL_TABLET | Freq: Two times a day (BID) | ORAL | 0 refills | Status: AC

## 2023-11-28 NOTE — Progress Notes (Signed)
[facility] Perioperative Services: Pre-Admission/Anesthesia Testing  Abnormal Lab Notification and Treatment Plan of Care   Date: 11/28/23  Name: Tamara [name] MRN:   [id number]  Re: Abnormal labs noted during PAT appointment   Notified:  Provider Name Provider Role Notification Mode  Poggi, Jonny Ruiz, MD Orthopedics (Surgeon) Routed and/or faxed via [facility]   Abnormal Lab Value(s):   Lab Results  Component Value Date   COLORURINE YELLOW (A) 11/27/2023   APPEARANCEUR CLEAR (A) 11/27/2023   LABSPEC 1.008 11/27/2023   PHURINE 5.0 11/27/2023   GLUCOSEU 50 (A) 11/27/2023   HGBUR NEGATIVE 11/27/2023   BILIRUBINUR NEGATIVE 11/27/2023   KETONESUR NEGATIVE 11/27/2023   PROTEINUR NEGATIVE 11/27/2023   UROBILINOGEN 0.2 03/22/2016   NITRITE POSITIVE (A) 11/27/2023   LEUKOCYTESUR NEGATIVE 11/27/2023   EPIU 0 11/27/2023   WBCU 0-5 11/27/2023   RBCU 0-5 11/27/2023   BACTERIA RARE (A) 11/27/2023   CULT (A) 11/27/2023    >=100,000 COLONIES/mL GRAM NEGATIVE RODS IDENTIFICATION AND SUSCEPTIBILITIES TO FOLLOW Performed at [facility], [address], [place], [place] [address]     Clinical Information and Notes:  Patient is scheduled for TOTAL KNEE ARTHROPLASTY (Right: Knee) on 12/02/2023.    UA performed in PAT consistent with/concerning for infection.  No leukocytosis noted on CBC; WBC 8.6 Renal function: BUN 10 mg/dL and creatinine 1.91 mg/dL; eGFR >47 mL/min Urine C&S added to assess for pathogenically significant growth.  Impression and Plan:  SHAQUANTA KUJALA with a UA that was (+) for infection. Sample nitrate (+) with bacteria noted. Reflex culture sent. Preliminary results showed (+) significant GNR coliform counts. (+) history of E.coli urinary tract infection. Contacted patient to discuss. Patient reporting that she is experiencing minor LUTS; (+) frequency, urgency, and voiding small amounts. No abdominal/suprapubic or back pain. No nausea,  vomiting, fevers, or chills. No gross hematuria.   Patient with surgery scheduled soon. In efforts to avoid delaying patient's procedure, or have her experience any potentially significant perioperative complications related to the aforementioned, I would like to proceed with empiric treatment for urinary tract infection. Limited time to treat between now and surgery.   Allergies reviewed. Allergic to sulfa. In efforts to maximize preoperative Tx in the setting of patient with minor LUTS. Abbreviated Tx regimen deemed appropriate. Additional coverage will be provided with antimicrobial used for presurgical prophylaxis. Will treat with a 3 day course of CIPROFLOXACIN. Patient encouraged to complete the entire course of antibiotics even if she begins to feel better. She was advised that if culture demonstrates resistance to the prescribed antibiotic, she will be contacted and advised of the need to change the antibiotic being used to treat her infection.   Meds ordered this encounter  Medications   ciprofloxacin (CIPRO) 500 MG tablet    Sig: Take 1 tablet (500 mg total) by mouth 2 (two) times daily for 3 days. Increase water intake while taking this medication.    Dispense:  6 tablet    Refill:  0    Please contact when ready for pick up. Rx for preoperative UTI Tx and needs to be started ASAP.   Patient encouraged to increase her fluid intake as much as possible. Discussed that water is always best to flush the urinary tract. She was advised to avoid caffeine containing fluids until her infections clears, as caffeine can cause her to experience painful bladder spasms.   May use Tylenol as needed for pain/fever should she experience these symptoms.   Patient  instructed to call surgeon's office or PAT with any questions or concerns related to the above outlined course of treatment. Additionally, she was instructed to call if she feels like she is getting worse overall while on treatment. Results and  treatment plan of care forwarded to primary attending surgeon to make them aware.   Encounter Diagnoses  Name Primary?   Pre-operative laboratory examination Yes   E. coli UTI (urinary tract infection)    Quentin Mulling, MSN, APRN, FNP-C, CEN [facility]  Perioperative Services Nurse Practitioner Phone: [phone] Fax: [phone] 11/28/23 9:55 PM  NOTE: This note has been prepared using Dragon dictation software. Despite my best ability to proofread, there is always the potential that unintentional transcriptional errors may still occur from this process.

## 2023-11-29 ENCOUNTER — Other Ambulatory Visit: Payer: Self-pay | Admitting: Internal Medicine

## 2023-11-29 LAB — URINE CULTURE: Culture: >=100000 — AB

## 2023-11-30 NOTE — Telephone Encounter (Signed)
LOV: 10/20/23  NOV: N/A

## 2023-12-02 ENCOUNTER — Ambulatory Visit: Payer: Medicare HMO | Admitting: Urgent Care

## 2023-12-02 ENCOUNTER — Encounter
Admission: RE | Disposition: A | Discharge status: Home / Self Care | Payer: Self-pay | Source: Home / Self Care | Attending: Surgery

## 2023-12-02 ENCOUNTER — Ambulatory Visit
Admission: RE | Admit: 2023-12-02 | Discharge: 2023-12-02 | Disposition: A | Discharge status: Home / Self Care | Payer: Medicare HMO | Attending: Surgery | Admitting: Surgery

## 2023-12-02 ENCOUNTER — Encounter: Payer: Self-pay | Admitting: Surgery

## 2023-12-02 ENCOUNTER — Other Ambulatory Visit: Payer: Self-pay

## 2023-12-02 ENCOUNTER — Ambulatory Visit: Payer: Medicare HMO

## 2023-12-02 ENCOUNTER — Ambulatory Visit: Payer: Self-pay

## 2023-12-02 DIAGNOSIS — Z9081 Acquired absence of spleen: Secondary | ICD-10-CM | POA: Insufficient documentation

## 2023-12-02 DIAGNOSIS — K219 Gastro-esophageal reflux disease without esophagitis: Secondary | ICD-10-CM | POA: Diagnosis not present

## 2023-12-02 DIAGNOSIS — F419 Anxiety disorder, unspecified: Secondary | ICD-10-CM | POA: Diagnosis not present

## 2023-12-02 DIAGNOSIS — Z86718 Personal history of other venous thrombosis and embolism: Secondary | ICD-10-CM | POA: Insufficient documentation

## 2023-12-02 DIAGNOSIS — K449 Diaphragmatic hernia without obstruction or gangrene: Secondary | ICD-10-CM | POA: Insufficient documentation

## 2023-12-02 DIAGNOSIS — I2510 Atherosclerotic heart disease of native coronary artery without angina pectoris: Secondary | ICD-10-CM | POA: Insufficient documentation

## 2023-12-02 DIAGNOSIS — F32A Depression, unspecified: Secondary | ICD-10-CM | POA: Diagnosis not present

## 2023-12-02 DIAGNOSIS — J439 Emphysema, unspecified: Secondary | ICD-10-CM | POA: Insufficient documentation

## 2023-12-02 DIAGNOSIS — E46 Unspecified protein-calorie malnutrition: Secondary | ICD-10-CM | POA: Diagnosis not present

## 2023-12-02 DIAGNOSIS — Z471 Aftercare following joint replacement surgery: Secondary | ICD-10-CM | POA: Diagnosis not present

## 2023-12-02 DIAGNOSIS — M11261 Other chondrocalcinosis, right knee: Secondary | ICD-10-CM | POA: Diagnosis not present

## 2023-12-02 DIAGNOSIS — F17210 Nicotine dependence, cigarettes, uncomplicated: Secondary | ICD-10-CM | POA: Diagnosis not present

## 2023-12-02 DIAGNOSIS — G4733 Obstructive sleep apnea (adult) (pediatric): Secondary | ICD-10-CM | POA: Diagnosis not present

## 2023-12-02 DIAGNOSIS — Z96651 Presence of right artificial knee joint: Secondary | ICD-10-CM | POA: Diagnosis not present

## 2023-12-02 DIAGNOSIS — M1711 Unilateral primary osteoarthritis, right knee: Secondary | ICD-10-CM | POA: Diagnosis not present

## 2023-12-02 DIAGNOSIS — I679 Cerebrovascular disease, unspecified: Secondary | ICD-10-CM | POA: Diagnosis not present

## 2023-12-02 DIAGNOSIS — R482 Apraxia: Secondary | ICD-10-CM | POA: Diagnosis not present

## 2023-12-02 HISTORY — PX: TOTAL KNEE ARTHROPLASTY: SHX125

## 2023-12-02 SURGERY — ARTHROPLASTY, KNEE, TOTAL
Anesthesia: General | Site: Knee | Laterality: Right

## 2023-12-02 MED ORDER — DEXAMETHASONE SODIUM PHOSPHATE 10 MG/ML IJ SOLN
INTRAMUSCULAR | Status: AC
  Filled 2023-12-02: qty 1

## 2023-12-02 MED ORDER — SODIUM CHLORIDE FLUSH 0.9 % IV SOLN
INTRAVENOUS | Status: AC
  Filled 2023-12-02: qty 40

## 2023-12-02 MED ORDER — SODIUM CHLORIDE 0.9 % BOLUS PEDS
250.0000 mL | Freq: Once | INTRAVENOUS | Status: AC
  Administered 2023-12-02: 250 mL via INTRAVENOUS

## 2023-12-02 MED ORDER — TRANEXAMIC ACID-NACL 1000-0.7 MG/100ML-% IV SOLN
INTRAVENOUS | Status: AC
  Filled 2023-12-02: qty 100

## 2023-12-02 MED ORDER — HYDROCODONE-ACETAMINOPHEN 5-325 MG PO TABS: 1.0000 | ORAL_TABLET | Freq: Four times a day (QID) | ORAL | 0 refills | Status: DC | PRN

## 2023-12-02 MED ORDER — MIDAZOLAM HCL 2 MG/2ML IJ SOLN
INTRAMUSCULAR | Status: DC | PRN
  Administered 2023-12-02: 2 mg via INTRAVENOUS

## 2023-12-02 MED ORDER — ONDANSETRON HCL 4 MG PO TABS: 4.0000 mg | ORAL_TABLET | Freq: Four times a day (QID) | ORAL | Status: DC | PRN

## 2023-12-02 MED ORDER — KETOROLAC TROMETHAMINE 15 MG/ML IJ SOLN
INTRAMUSCULAR | Status: AC
  Filled 2023-12-02: qty 1

## 2023-12-02 MED ORDER — SODIUM CHLORIDE 0.9 % BOLUS PEDS: 250.0000 mL | Freq: Once | INTRAVENOUS | Status: DC

## 2023-12-02 MED ORDER — HYDROMORPHONE HCL 1 MG/ML IJ SOLN
INTRAMUSCULAR | Status: AC
  Filled 2023-12-02: qty 1

## 2023-12-02 MED ORDER — MIDAZOLAM HCL 2 MG/2ML IJ SOLN
INTRAMUSCULAR | Status: AC
  Filled 2023-12-02: qty 2

## 2023-12-02 MED ORDER — LIDOCAINE HCL (PF) 2 % IJ SOLN
INTRAMUSCULAR | Status: AC
  Filled 2023-12-02: qty 5

## 2023-12-02 MED ORDER — ONDANSETRON HCL 4 MG/2ML IJ SOLN: 4.0000 mg | Freq: Four times a day (QID) | INTRAMUSCULAR | Status: DC | PRN

## 2023-12-02 MED ORDER — ACETAMINOPHEN 10 MG/ML IV SOLN
INTRAVENOUS | Status: AC
  Filled 2023-12-02: qty 100

## 2023-12-02 MED ORDER — PROPOFOL 1000 MG/100ML IV EMUL
INTRAVENOUS | Status: AC
  Filled 2023-12-02: qty 100

## 2023-12-02 MED ORDER — DEXMEDETOMIDINE HCL IN NACL 80 MCG/20ML IV SOLN
INTRAVENOUS | Status: AC
  Filled 2023-12-02: qty 20

## 2023-12-02 MED ORDER — PHENYLEPHRINE HCL-NACL 20-0.9 MG/250ML-% IV SOLN
INTRAVENOUS | Status: DC | PRN
  Administered 2023-12-02: 25 ug/min via INTRAVENOUS

## 2023-12-02 MED ORDER — PHENYLEPHRINE HCL-NACL 20-0.9 MG/250ML-% IV SOLN
INTRAVENOUS | Status: AC
  Filled 2023-12-02: qty 250

## 2023-12-02 MED ORDER — KETAMINE HCL 50 MG/5ML IJ SOSY
PREFILLED_SYRINGE | INTRAMUSCULAR | Status: DC | PRN
  Administered 2023-12-02: 10 mg via INTRAVENOUS
  Administered 2023-12-02: 20 mg via INTRAVENOUS

## 2023-12-02 MED ORDER — CEFAZOLIN SODIUM-DEXTROSE 2-4 GM/100ML-% IV SOLN
INTRAVENOUS | Status: AC
  Filled 2023-12-02: qty 100

## 2023-12-02 MED ORDER — CEFAZOLIN SODIUM-DEXTROSE 2-4 GM/100ML-% IV SOLN
2.0000 g | INTRAVENOUS | Status: AC
  Administered 2023-12-02: 2 g via INTRAVENOUS

## 2023-12-02 MED ORDER — TRIAMCINOLONE ACETONIDE 40 MG/ML IJ SUSP
INTRAMUSCULAR | Status: DC | PRN
  Administered 2023-12-02: 93 mL

## 2023-12-02 MED ORDER — RIVAROXABAN 10 MG PO TABS: 10.0000 mg | ORAL_TABLET | Freq: Every day | ORAL | 0 refills | Status: DC

## 2023-12-02 MED ORDER — FENTANYL CITRATE (PF) 100 MCG/2ML IJ SOLN
INTRAMUSCULAR | Status: DC | PRN
  Administered 2023-12-02: 100 ug via INTRAVENOUS

## 2023-12-02 MED ORDER — ONDANSETRON HCL 4 MG/2ML IJ SOLN
INTRAMUSCULAR | Status: DC | PRN
  Administered 2023-12-02: 4 mg via INTRAVENOUS

## 2023-12-02 MED ORDER — HYDROMORPHONE HCL 1 MG/ML IJ SOLN
INTRAMUSCULAR | Status: DC | PRN
  Administered 2023-12-02 (×2): .5 mg via INTRAVENOUS

## 2023-12-02 MED ORDER — FENTANYL CITRATE (PF) 100 MCG/2ML IJ SOLN
INTRAMUSCULAR | Status: AC
  Filled 2023-12-02: qty 2

## 2023-12-02 MED ORDER — BUPIVACAINE-EPINEPHRINE (PF) 0.5% -1:200000 IJ SOLN
INTRAMUSCULAR | Status: AC
  Filled 2023-12-02: qty 30

## 2023-12-02 MED ORDER — SEVOFLURANE IN SOLN
RESPIRATORY_TRACT | Status: AC
  Filled 2023-12-02: qty 250

## 2023-12-02 MED ORDER — KETOROLAC TROMETHAMINE 15 MG/ML IJ SOLN
15.0000 mg | Freq: Once | INTRAMUSCULAR | Status: AC
  Administered 2023-12-02: 15 mg via INTRAVENOUS

## 2023-12-02 MED ORDER — PHENYLEPHRINE 80 MCG/ML (10ML) SYRINGE FOR IV PUSH (FOR BLOOD PRESSURE SUPPORT)
PREFILLED_SYRINGE | INTRAVENOUS | Status: AC
  Filled 2023-12-02: qty 10

## 2023-12-02 MED ORDER — HYDROCODONE-ACETAMINOPHEN 5-325 MG PO TABS
ORAL_TABLET | ORAL | Status: AC
  Filled 2023-12-02: qty 1

## 2023-12-02 MED ORDER — ONDANSETRON 4 MG PO TBDP: 4.0000 mg | ORAL_TABLET | Freq: Three times a day (TID) | ORAL | 1 refills | Status: AC | PRN

## 2023-12-02 MED ORDER — CHLORHEXIDINE GLUCONATE 0.12 % MT SOLN
OROMUCOSAL | Status: AC
Start: 2023-12-02 — End: ?
  Filled 2023-12-02: qty 15

## 2023-12-02 MED ORDER — FENTANYL CITRATE (PF) 100 MCG/2ML IJ SOLN
25.0000 ug | INTRAMUSCULAR | Status: DC | PRN
  Administered 2023-12-02 (×4): 25 ug via INTRAVENOUS

## 2023-12-02 MED ORDER — BUPIVACAINE LIPOSOME 1.3 % IJ SUSP
INTRAMUSCULAR | Status: AC
  Filled 2023-12-02: qty 20

## 2023-12-02 MED ORDER — DEXAMETHASONE SODIUM PHOSPHATE 10 MG/ML IJ SOLN
INTRAMUSCULAR | Status: DC | PRN
  Administered 2023-12-02: 10 mg via INTRAVENOUS

## 2023-12-02 MED ORDER — ONDANSETRON HCL 4 MG/2ML IJ SOLN: 4.0000 mg | Freq: Once | INTRAMUSCULAR | Status: DC | PRN

## 2023-12-02 MED ORDER — CEFAZOLIN SODIUM-DEXTROSE 2-4 GM/100ML-% IV SOLN
2.0000 g | Freq: Four times a day (QID) | INTRAVENOUS | Status: DC
  Administered 2023-12-02: 2 g via INTRAVENOUS

## 2023-12-02 MED ORDER — METOCLOPRAMIDE HCL 10 MG PO TABS: 5.0000 mg | ORAL_TABLET | Freq: Three times a day (TID) | ORAL | Status: DC | PRN

## 2023-12-02 MED ORDER — KETOROLAC TROMETHAMINE 30 MG/ML IJ SOLN
INTRAMUSCULAR | Status: AC
  Filled 2023-12-02: qty 1

## 2023-12-02 MED ORDER — LIDOCAINE HCL (CARDIAC) PF 100 MG/5ML IV SOSY
PREFILLED_SYRINGE | INTRAVENOUS | Status: DC | PRN
  Administered 2023-12-02: 60 mg via INTRAVENOUS

## 2023-12-02 MED ORDER — KETAMINE HCL 50 MG/5ML IJ SOSY
PREFILLED_SYRINGE | INTRAMUSCULAR | Status: AC
  Filled 2023-12-02: qty 5

## 2023-12-02 MED ORDER — HYDROCODONE-ACETAMINOPHEN 5-325 MG PO TABS
1.0000 | ORAL_TABLET | ORAL | Status: DC | PRN
  Administered 2023-12-02 (×2): 1 via ORAL

## 2023-12-02 MED ORDER — ACETAMINOPHEN 10 MG/ML IV SOLN: 15.0000 mg/kg | Freq: Once | INTRAVENOUS | Status: DC | PRN

## 2023-12-02 MED ORDER — ACETAMINOPHEN 10 MG/ML IV SOLN
INTRAVENOUS | Status: DC | PRN
  Administered 2023-12-02: 1000 mg via INTRAVENOUS

## 2023-12-02 MED ORDER — ORAL CARE MOUTH RINSE: 15.0000 mL | Freq: Once | OROMUCOSAL | Status: AC

## 2023-12-02 MED ORDER — ACETAMINOPHEN 325 MG PO TABS: 325.0000 mg | ORAL_TABLET | Freq: Four times a day (QID) | ORAL | Status: DC | PRN

## 2023-12-02 MED ORDER — TRIAMCINOLONE ACETONIDE 40 MG/ML IJ SUSP
INTRAMUSCULAR | Status: AC
  Filled 2023-12-02: qty 2

## 2023-12-02 MED ORDER — SUGAMMADEX SODIUM 200 MG/2ML IV SOLN
INTRAVENOUS | Status: DC | PRN
  Administered 2023-12-02: 200 mg via INTRAVENOUS

## 2023-12-02 MED ORDER — LACTATED RINGERS IV SOLN: INTRAVENOUS | Status: DC

## 2023-12-02 MED ORDER — OXYCODONE HCL 5 MG PO TABS
5.0000 mg | ORAL_TABLET | Freq: Once | ORAL | Status: AC | PRN
  Administered 2023-12-02: 5 mg via ORAL

## 2023-12-02 MED ORDER — CHLORHEXIDINE GLUCONATE 0.12 % MT SOLN
15.0000 mL | Freq: Once | OROMUCOSAL | Status: AC
  Administered 2023-12-02: 15 mL via OROMUCOSAL

## 2023-12-02 MED ORDER — DEXTROSE-SODIUM CHLORIDE 5-0.9 % IV SOLN: INTRAVENOUS | Status: DC

## 2023-12-02 MED ORDER — OXYCODONE HCL 5 MG/5ML PO SOLN: 5.0000 mg | Freq: Once | ORAL | Status: AC | PRN

## 2023-12-02 MED ORDER — PHENYLEPHRINE 80 MCG/ML (10ML) SYRINGE FOR IV PUSH (FOR BLOOD PRESSURE SUPPORT)
PREFILLED_SYRINGE | INTRAVENOUS | Status: DC | PRN
  Administered 2023-12-02: 40 ug via INTRAVENOUS
  Administered 2023-12-02 (×3): 80 ug via INTRAVENOUS

## 2023-12-02 MED ORDER — TRANEXAMIC ACID-NACL 1000-0.7 MG/100ML-% IV SOLN
1000.0000 mg | INTRAVENOUS | Status: AC
  Administered 2023-12-02: 1000 mg via INTRAVENOUS

## 2023-12-02 MED ORDER — ONDANSETRON HCL 4 MG/2ML IJ SOLN
INTRAMUSCULAR | Status: AC
  Filled 2023-12-02: qty 2

## 2023-12-02 MED ORDER — ROCURONIUM BROMIDE 100 MG/10ML IV SOLN
INTRAVENOUS | Status: DC | PRN
  Administered 2023-12-02: 50 mg via INTRAVENOUS
  Administered 2023-12-02 (×2): 10 mg via INTRAVENOUS

## 2023-12-02 MED ORDER — SERTRALINE HCL 100 MG PO TABS: 100.0000 mg | ORAL_TABLET | Freq: Every day | ORAL | Status: DC

## 2023-12-02 MED ORDER — METOCLOPRAMIDE HCL 5 MG/ML IJ SOLN: 5.0000 mg | Freq: Three times a day (TID) | INTRAMUSCULAR | Status: DC | PRN

## 2023-12-02 MED ORDER — OXYCODONE HCL 5 MG PO TABS
ORAL_TABLET | ORAL | Status: AC
  Filled 2023-12-02: qty 1

## 2023-12-02 MED ORDER — LACTATED RINGERS IV SOLN: INTRAVENOUS | Status: AC

## 2023-12-02 MED ORDER — PROPOFOL 10 MG/ML IV BOLUS
INTRAVENOUS | Status: DC | PRN
  Administered 2023-12-02: 20 ug/kg/min via INTRAVENOUS
  Administered 2023-12-02: 100 mg via INTRAVENOUS
  Administered 2023-12-02: 50 mg via INTRAVENOUS

## 2023-12-02 MED ORDER — SODIUM CHLORIDE 0.9 % IR SOLN
Status: DC | PRN
  Administered 2023-12-02: 3000 mL

## 2023-12-02 MED ORDER — SODIUM CHLORIDE 0.9 % IV SOLN: INTRAVENOUS | Status: DC | PRN

## 2023-12-02 SURGICAL SUPPLY — 58 items
BLADE SAW 90X13X1.19 OSCILLAT (BLADE) ×1 IMPLANT
BLADE SAW SAG 25X90X1.19 (BLADE) ×1 IMPLANT
BLADE SURG SZ20 CARB STEEL (BLADE) ×1 IMPLANT
BNDG ELASTIC 6X5.8 VLCR NS LF (GAUZE/BANDAGES/DRESSINGS) ×1 IMPLANT
CEMENT BONE R 1X40 (Cement) ×2 IMPLANT
CEMENT VACUUM MIXING SYSTEM (MISCELLANEOUS) ×1 IMPLANT
CHLORAPREP W/TINT 26 (MISCELLANEOUS) ×1 IMPLANT
COMP FEM CEMT PERS NARROW 6 RT (Joint) ×1 IMPLANT
COMP PATELLAR PEGX3 28X6.2 (Knees) ×1 IMPLANT
COMP TIB PSN SZ C 0 DEG RT (Joint) ×1 IMPLANT
COMPONENT FEM CMT PERS NRW 6RT (Joint) IMPLANT
COMPONENT PTLLAR PEGX3 28X6.2 (Knees) IMPLANT
COMPONET TIB PSN SZ C 0 DEG RT (Joint) IMPLANT
COOLER POLAR GLACIER W/PUMP (MISCELLANEOUS) ×1 IMPLANT
COVER MAYO STAND STRL (DRAPES) ×1 IMPLANT
CUFF TRNQT CYL 24X4X16.5-23 (TOURNIQUET CUFF) IMPLANT
CUFF TRNQT CYL 34X4.125X (TOURNIQUET CUFF) IMPLANT
DRAPE IMP U-DRAPE 54X76 (DRAPES) ×1 IMPLANT
DRAPE SHEET LG 3/4 BI-LAMINATE (DRAPES) ×1 IMPLANT
DRAPE U-SHAPE 47X51 STRL (DRAPES) ×1 IMPLANT
DRSG MEPILEX SACRM 8.7X9.8 (GAUZE/BANDAGES/DRESSINGS) IMPLANT
DRSG OPSITE POSTOP 4X10 (GAUZE/BANDAGES/DRESSINGS) ×1 IMPLANT
DRSG OPSITE POSTOP 4X8 (GAUZE/BANDAGES/DRESSINGS) ×1 IMPLANT
ELECT CAUTERY BLADE 6.4 (BLADE) ×1 IMPLANT
ELECT REM PT RETURN 9FT ADLT (ELECTROSURGICAL) ×1
ELECTRODE REM PT RTRN 9FT ADLT (ELECTROSURGICAL) ×1 IMPLANT
GAUZE XEROFORM 1X8 LF (GAUZE/BANDAGES/DRESSINGS) ×1 IMPLANT
GLOVE BIO SURGEON STRL SZ7.5 (GLOVE) ×4 IMPLANT
GLOVE BIO SURGEON STRL SZ8 (GLOVE) ×4 IMPLANT
GLOVE BIOGEL PI IND STRL 8 (GLOVE) ×1 IMPLANT
GLOVE INDICATOR 8.0 STRL GRN (GLOVE) ×1 IMPLANT
GOWN STRL REUS W/ TWL LRG LVL3 (GOWN DISPOSABLE) ×1 IMPLANT
GOWN STRL REUS W/ TWL XL LVL3 (GOWN DISPOSABLE) ×1 IMPLANT
HOOD PEEL AWAY T7 (MISCELLANEOUS) ×3 IMPLANT
INSERT TIB ASF PERS CD6-7 RT (Insert) IMPLANT
IV NS IRRIG 3000ML ARTHROMATIC (IV SOLUTION) ×1 IMPLANT
KIT TURNOVER KIT A (KITS) ×1 IMPLANT
MANIFOLD NEPTUNE II (INSTRUMENTS) ×1 IMPLANT
NDL SPNL 20GX3.5 QUINCKE YW (NEEDLE) ×1 IMPLANT
NEEDLE SPNL 20GX3.5 QUINCKE YW (NEEDLE) ×1
NS IRRIG 500ML POUR BTL (IV SOLUTION) ×1 IMPLANT
PACK TOTAL KNEE (MISCELLANEOUS) ×1 IMPLANT
PAD WRAPON POLAR KNEE (MISCELLANEOUS) ×1 IMPLANT
PENCIL SMOKE EVACUATOR (MISCELLANEOUS) ×1 IMPLANT
PIN DRILL HDLS TROCAR 75 4PK (PIN) IMPLANT
PULSAVAC PLUS IRRIG FAN TIP (DISPOSABLE) ×1
SCREW FEMALE HEX FIX 25X2.5 (ORTHOPEDIC DISPOSABLE SUPPLIES) IMPLANT
STAPLER SKIN PROX 35W (STAPLE) ×1 IMPLANT
SUCTION TUBE FRAZIER 10FR DISP (SUCTIONS) ×1 IMPLANT
SUT VIC AB 0 CT1 36 (SUTURE) ×3 IMPLANT
SUT VIC AB 2-0 CT1 TAPERPNT 27 (SUTURE) ×3 IMPLANT
SYR 10ML LL (SYRINGE) ×1 IMPLANT
SYR 20ML LL LF (SYRINGE) ×1 IMPLANT
SYR 30ML LL (SYRINGE) IMPLANT
TIP FAN IRRIG PULSAVAC PLUS (DISPOSABLE) ×1 IMPLANT
TRAP FLUID SMOKE EVACUATOR (MISCELLANEOUS) ×1 IMPLANT
WATER STERILE IRR 500ML POUR (IV SOLUTION) ×1 IMPLANT
WRAPON POLAR PAD KNEE (MISCELLANEOUS) ×1

## 2023-12-02 NOTE — Evaluation (Addendum)
Physical Therapy Evaluation Patient Details Name: Tamara [name] MRN: [id number] DOB: 1946/03/15 Today's Date: 12/02/2023  History of Present Illness  Tamara [name] is a 77yoF who comes to [facility] on 12/02/23 for elective Rt TKA. PMH: OSA, COPD, CAD, HA, DVT, amanurosis fugax, GAD, carpal tunnel syndrome, Crohn's disease, facial twitching, SBO. PSH: L4/5 TLIF May 2023, Rt L4/5 lateral discectomy, Rt TSH 2024.  Clinical Impression  Pt in gurney awaiting PT arrival, reports pain at goal. Pt reports knee and foot largely numb, she fails tactile localization in stance, but has no signs of motor weakness during AMB, no foot drop, no buckling. Pt requires no physical assistance for mobility tasks, educated on safe RW use for transfers and AMB. Pt givens full attention to leaving as soon as possible and resuming her independence ASAP, encouraged pt to exercise prudence and caution as she recovers, including not making her decision on RW based on personal preference and pride alone. VSS in session, AMB twice without adverse event. Son educated on precautions with handout. ROM in knee and gait speed both above what is typical. DME and services needs made known to TOC/RN.       If plan is discharge home, recommend the following: A little help with walking and/or transfers;Assist for transportation;Assistance with cooking/housework;Help with stairs or ramp for entrance   Can travel by private vehicle        Equipment Recommendations Rolling walker (2 wheels);BSC/3in1  Recommendations for Other Services       Functional Status Assessment Patient has had a recent decline in their functional status and demonstrates the ability to make significant improvements in function in a reasonable and predictable amount of time.     Precautions / Restrictions Precautions Precautions: Fall Restrictions Weight Bearing Restrictions Per Provider Order: Yes RLE Weight Bearing Per Provider Order: Weight bearing as  tolerated      Mobility  Bed Mobility Overal bed mobility: Modified Independent                  Transfers Overall transfer level: Needs assistance Equipment used: Rolling walker (2 wheels) Transfers: Sit to/from Stand Sit to Stand: Contact guard assist, Supervision                Ambulation/Gait   Gait Distance (Feet): 200 Feet (19ft, then 272ft 2nd walk) Assistive device: Rolling walker (2 wheels) Gait Pattern/deviations: Step-through pattern, WFL(Within Functional Limits) Gait velocity: 0.38m/s     General Gait Details: cues to not pass wheels with feet; no LOB; able to achieve symetrical gait.  Stairs Stairs:  (pt planning on using ramp entrance)          Wheelchair Mobility     Tilt Bed    Modified Rankin (Stroke Patients Only)       Balance Overall balance assessment: Modified Independent                                           Pertinent Vitals/Pain Pain Assessment Pain Assessment: Faces Faces Pain Scale: Hurts a little bit Pain Location: does not rate Pain Intervention(s): Limited activity within patient's tolerance, Monitored during session, Premedicated before session    Home Living Family/patient expects to be discharged to:: Private residence Living Arrangements: Alone Available Help at Discharge: Family (son lives adjacent, planning to assist) Type of Home: House Home Access: Ramped entrance       Home  Layout: One level Home Equipment: None      Prior Function Prior Level of Function : Independent/Modified Independent             Mobility Comments: no use of device prior to surgery ADLs Comments: independent     Extremity/Trunk Assessment                Communication      Cognition Arousal: Alert Behavior During Therapy: Anxious, Impulsive Overall Cognitive Status: Within Functional Limits for tasks assessed                                          General  Comments      Exercises Total Joint Exercises Long Arc Quad: AROM Knee Flexion: AROM Goniometric ROM: Rt knee ROM: 15-100 degrees in recliner (no pain, pt reports her leg is bascially numb still) General Exercises - Lower Extremity Ankle Circles/Pumps: AROM Straight Leg Raises: AROM   Assessment/Plan    PT Assessment Patient needs continued PT services  PT Problem List Decreased strength;Decreased range of motion;Decreased activity tolerance;Decreased balance;Decreased mobility;Decreased knowledge of use of DME       PT Treatment Interventions DME instruction;Gait training;Stair training;Functional mobility training;Therapeutic exercise;Therapeutic activities;Balance training;Patient/family education    PT Goals (Current goals can be found in the Care Plan section)  Acute Rehab PT Goals Patient Stated Goal: leave immediately ASAP PT Goal Formulation: With patient Time For Goal Achievement: 12/16/23 Potential to Achieve Goals: Good    Frequency 7X/week     Co-evaluation               AM-PAC PT "6 Clicks" Mobility  Outcome Measure Help needed turning from your back to your side while in a flat bed without using bedrails?: None Help needed moving from lying on your back to sitting on the side of a flat bed without using bedrails?: None Help needed moving to and from a bed to a chair (including a wheelchair)?: None Help needed standing up from a chair using your arms (e.g., wheelchair or bedside chair)?: None Help needed to walk in hospital room?: A Little Help needed climbing 3-5 steps with a railing? : A Little 6 Click Score: 22    End of Session Equipment Utilized During Treatment: Gait belt Activity Tolerance: Patient tolerated treatment well;No increased pain Patient left: in chair;with call bell/phone within reach;with family/visitor present Nurse Communication: Mobility status PT Visit Diagnosis: Unsteadiness on feet (R26.81);Other abnormalities of gait and  mobility (R26.89)    Time: 1610-9604 PT Time Calculation (min) (ACUTE ONLY): 46 min   Charges:   PT Evaluation $PT Eval Moderate Complexity: 1 Mod PT Treatments $Gait Training: 8-22 mins $Therapeutic Activity: 8-22 mins PT General Charges $$ ACUTE PT VISIT: 1 Visit        3:31 PM, 12/02/23 Rosamaria Lints, PT, DPT Physical Therapist - [facility]  [phone] (ASCOM)    Keyston Ardolino C 12/02/2023, 3:27 PM

## 2023-12-02 NOTE — Transfer of Care (Signed)
Immediate Anesthesia Transfer of Care Note  Patient: Tamara [name]  Procedure(s) Performed: TOTAL KNEE ARTHROPLASTY (Right: Knee)  Patient Location: PACU  Anesthesia Type:General  Level of Consciousness: awake, alert , oriented, and patient cooperative  Airway & Oxygen Therapy: Patient Spontanous Breathing and Patient connected to face mask oxygen  Post-op Assessment: Report given to RN, Post -op Vital signs reviewed and stable, and Patient moving all extremities X 4  Post vital signs: Reviewed and stable  Last Vitals:  Vitals Value Taken Time  BP 148/60 12/02/23 1005  Temp    Pulse 114 12/02/23 1007  Resp 15 12/02/23 1007  SpO2 99 % 12/02/23 1007  Vitals shown include unfiled device data.  Last Pain:  Vitals:   12/02/23 0616  TempSrc: Oral         Complications:  Encounter Notable Events  Notable Event Outcome Phase Comment  Difficult to intubate - unexpected  Intraprocedure Filed from anesthesia note documentation.

## 2023-12-02 NOTE — Anesthesia Postprocedure Evaluation (Signed)
Anesthesia Post Note  Patient: Tamara [name]  Procedure(s) Performed: TOTAL KNEE ARTHROPLASTY (Right: Knee)  Patient location during evaluation: PACU Anesthesia Type: General Level of consciousness: awake and alert, oriented and patient cooperative Pain management: pain level controlled Vital Signs Assessment: post-procedure vital signs reviewed and stable Respiratory status: spontaneous breathing, nonlabored ventilation and respiratory function stable Cardiovascular status: blood pressure returned to baseline and stable Postop Assessment: adequate PO intake Anesthetic complications: yes   Encounter Notable Events  Notable Event Outcome Phase Comment  Difficult to intubate - unexpected  Intraprocedure Filed from anesthesia note documentation.     Last Vitals:  Vitals:   12/02/23 1045 12/02/23 1101  BP: 124/63 128/70  Pulse: (!) 106 (!) 105  Resp: 19 17  Temp:  36.7 C  SpO2: 95% 96%    Last Pain:  Vitals:   12/02/23 1045  TempSrc:   PainSc: 0-No pain                 Reed Breech

## 2023-12-02 NOTE — H&P (Signed)
History of Present Illness: Tamara [name] is a 78 y.o. who presents today for history and physical. She is to undergo a right total knee arthroplasty on 12/01/2013. Since her last visit at the clinic there has been no improvement in her condition. The patient expresses her desire to proceed with surgery.  Patient has a chronic right knee pain and swelling. The patient has been evaluated past and diagnosed with right knee osteoarthritic changes in addition to evidence of previous pseudogout and has a rather large Baker's cyst. The patient has been evaluated the past and has undergone a right Baker's cyst aspiration,. The patient states that the cyst has gradually returned. The patient does still report a moderate aching throbbing pain throughout the right knee. She does report pressure along the posterior aspect knee in addition to increased pain walking and standing. The patient denies any trauma or injury affecting the right knee since her last evaluation. The patient has been evaluated by vascular surgery and does have evidence of a chronic DVT in the right lower extremity as well.   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 Refills given Lab Results Component Value Date VITAMINB12 263 01/26/2014)  Anxiety state 05/06/2021  Last Assessment & Plan: Formatting of this note might be different from the original. secondary to son's recent AMI. She has been taking sertraline for years, 50 mg dose along with clonazepam for insomnia. Advised to increase zoloft to 100 mg daily  Aortic atherosclerosis (CMS-HCC) 07/19/2020  Last Assessment & Plan: Formatting of this note might be different from the original. Reviewed findings of prior CT scan today.. Patient is willing to Initiate statin therpay starting with 10 mg crestor once daily  Last Assessment & Plan: Formatting of this note might be different from the original. Reviewed findings of prior CT scan today.. Patient is tolerating statin therapy with 10 mg cre  B12 deficiency 01/26/2014  Last Assessment & Plan: Formatting of this note is different from the original. Advised to increase oral intake to daily. Repeat level needed Lab Results Component Value Date VITAMINB12 290 04/15/2016 Last Assessment & Plan: Formatting of this note is different from the original. Recurrent due to patient nonadherence to monthly injection schedule. She has crohn's of the small intestine an  Carpal tunnel syndrome 03/08/2014  Last Assessment & Plan: S/p CT release sugery by Micheal Minz. Last month. Doing well post operatively  Cataracts, bilateral  Chest pain 05/11/2013  Last Assessment & Plan: Recurrent, Atypical. Multiple CRFs. EKG and stat cardiac enzymes were negative for ischemic changes. Refer to [facility] for further evaluation  Chronic migraine without aura 09/10/2014  Last Assessment & Plan: Workup for vascular cause done after last visit, Advised her to increase pamelor to 25 mg DAILY.  Cough 03/08/2014  Last Assessment & Plan: Lungs clear but given long term cough in smoker, will get CXR today.  Crohn's disease (CMS/HHS-HCC)  diagnosed 2005  Crohn's disease in remission (CMS/HHS-HCC) 04/28/2019  DVT (deep venous thrombosis) (CMS/HHS-HCC) 04/24/2018  Emphysema with chronic bronchitis (CMS/HHS-HCC) 01/28/2020  Last Assessment & Plan: Formatting of this note might be different from the original. She is asymptomatic currently and have been prescribed maintenance doses of Spiriva and Symbicort  Encounter for preventive health examination 05/11/2013  Last Assessment & Plan: Sh eis at high risk for stroke and CAD . wellbutrin prescribed today after long discussio  nof pros and cons of pharmacotherapy. Last Assessment & Plan: Formatting of this note is different from the original. She declines mammograms due  to pain of procedure. She [place] up to date on other screenings. age appropriate education and counseling updated, referrals for preventat  Extremity pain 03/17/2014  GERD (gastroesophageal reflux disease)  Gouty arthropathy 10/08/2014  Headache 03/17/2014  Hematochezia 09/07/2020  History of bone density study 06/01/2009  History of bone density study 03/24/2012  History of Clostridium difficile infection 05/24/2021  History of hepatic disease 10/18/2014  Overview: s/p laparotomy/ LOA 3/08, prior reversal of jejunal bypasss 2004  History of tobacco abuse 01/26/2014  Last Assessment & Plan: Formatting of this note might be different from the original. She stopped smoking Oct 2021 using Wellbutrin  Intractable chronic migraine without aura and without status migrainosus 05/20/2019  Low vitamin D level 05/01/2019  Malaise 03/08/2014  Last Assessment & Plan: With several other long term complaints. We made an appointment with her PCP, Dr. Darrick Huntsman for tomorrow to discuss these concerns.  Malnutrition (CMS/HHS-HCC) 02/20/2018  Last Assessment & Plan: Formatting of this note might be different from the original. I have reviewed her diet and recommended that she increase her protein and fat intake while monitoring her carbohydrates.  Moderate tricuspid insufficiency 12/15/2014  Numbness 03/17/2014  Orthostatic hypotension 12/13/2014  Last Assessment & Plan: Symptomatic ,reviewed list of meds and not taking a diuretic. Her Am cortisol was normal today, Advised to increase hydration and stop prn use of spironolactone, whci she has NOT been taking, . Will consider referral to cardiology for POTS if persistent.  OSA (obstructive sleep apnea) 08/01/2019  Formatting of this note might be different from the original. Mild by home 2 night study Done in June 2020. autotitrating CPAP ordered Sept 21 2020 Last Assessment & Plan: Formatting of this note might be different from the original. Diagnosed by prior sleep study. Patient is  using CPAP every night a minimum of 6 hours per night and notes improved daytime wakefulness and decreased fatigue  Osteoporosis  Pain in both upper extremities 08/24/2015  Personal history of other diseases of the digestive system 02/20/2015  Overview: s/p laparotomy/ LOA 3/08, prior reversal of jejunal bypasss 2004  Personal history of venous thrombosis and embolism 01/10/2007  Overview: post operative right leg, s/p vena cava filter  Pharyngoesophageal dysphagia 09/07/2020  Primary osteoarthritis of left knee 06/24/2017  Reflux esophagitis 01/24/2018  Last Assessment & Plan: Formatting of this note might be different from the original. Managed with omeprazole 20 mg daily  Regional enteritis (CMS/HHS-HCC) 10/18/2014  Last Assessment & Plan: Managed by Dr. Mechele Collin with mesalamine  Right lower quadrant abdominal pain 01/30/2021  S/P carpal tunnel release 05/18/2014  SBO (small bowel obstruction) (CMS/HHS-HCC) 2001 and 2004  Spinal stenosis, lumbar region, with neurogenic claudication 11/08/2016  Last Assessment & Plan: Formatting of this note might be different from the original. Confirmed by MRI with bilateral L4 nervie root impingement as well. Recommending neurosurgical referral.  Status post carpal tunnel release 04/29/2014  Steroid-induced osteoporosis 11/19/2011  Last Assessment & Plan: Formatting of this note might be different from the original. Given her smoking (ongoing) and history of hiatal hernia repair, Her options appear to be limited to Reclast given failure of Prolia due to cost. She sees endocrinology next week. Reviewed calcium and vitamin D needs. Last Assessment & Plan: Formatting of this note might be different from the original. Her op  Tingling 03/01/2015  Tobacco abuse 03/23/2014  Underweight 08/27/2015  Last Assessment & Plan: Formatting of this note might be different from the original. Chronic, with slight improvement following resolution of dysphagia with Nissen funduplication  takedown Last Assessment & Plan: Formatting of this note might be different from the original. She refuses to regard er low body weight as pathologic and does not want to gai weight. She is not bulemic or anorexic.  Vision problems 03/17/2014  Vitamin D deficiency 08/27/2015  Last Assessment & Plan: Formatting of this note might be different from the original. Recurrent, Will continue weekly vitamin d 50,000 IUs Last Assessment & Plan: Formatting of this note is different from the original. With osteoporosis and recent fall and fracture. Continue current supplementation Last vitamin D Lab Results Component Value Date VD25OH 43.98 01/29/2021  Weight loss, abnormal 09/07/2020   Past Surgical History: SIGMOIDOSCOPY FLEXIBLE 05/22/1998  COLONOSCOPY 04/25/2004  Hyperplastic Polyp  CAPSULE ENDOSCOPY 05/02/2004  FUSION OF C6-7 ANTERIOR CERVICAL APPROACH 10/30/2004  CAPSULE ENDOSCOPY 08/28/2005  Right Carpal Tunnel Release 04/22/2014  Right long finger trigger release 09/18/2015  BACK SURGERY 12/25/2016 (L4-L5 decompression)  EGD 07/22/2017 (No repeat per RTE)  COLONOSCOPY 05/10/2019 (PH Crohn's; Adenomatous Polyp: CBF 04/2024  Left knee arthroscopy, partial medial menisectomy, chondroplasty of patellofemoral and medial compartments Left 06/22/2020  Dr. Allena Katz  COLONOSCOPY 09/26/2020  Negative colon biopsies/PHx Crohn's/Repeat 52yrs/TKT  EGD 09/26/2020  Normal EGD biopsy/PHx GERD/REpeat 37yrs/TKT  Left knee arthroscopy, partial synovectomy with infrapatellar fat pad debridement, chondroplasty of patellofemoral and medial compartments Left 05/25/2021 (Dr. Allena Katz)  Excision and reimplantation of traumatic neuroma of infrapatellar branch of saphenous nerve, repair of medial retinacular defect, left knee Left 09/18/2021 (Dr. Joice Lofts)  Right L4-5 far lateral discectomy 11/06/2021 (Dr Venetia Night at [facility])  L4-5 transforaminal lumbar interbody fusion 04/01/2022 (Dr Venetia Night at [facility], [place]   Extensive arthroscopic debridement, arthroscopic subacromial decompression, and mini-open rotator cuff repair, left shoulder Left 03/04/2023 (Dr. Joice Lofts)  ANAL SPHINCTER PROSTHESIS PLACEMENT  APPENDECTOMY  CHOLECYSTECTOMY OPEN  COLONOSCOPY 03/21/2014, 02/01/2009, 01/29/2006 (PH Crohn's Disease: CBF 03/2019) EGD 09/01/2013, 02/07/2009, 01/05/2007, 05/06/2005, 03/17/1995  No repeat per RTE  HYSTERECTOMY VAGINAL  Left Long Finger Trigger Release 08/25/15 (Dr Rosita Kea)  spleen removal (patient had a colonoscopy in [place] and they ruptured her spleen)  TONSILLECTOMY AND ADENOIDECTOMY   Past Family History: Myocardial Infarction (Heart attack) Mother  Diabetes Mother  Osteoporosis (Thinning of bones) Mother  Rheum arthritis Mother  Myocardial Infarction (Heart attack) Father  Myocardial Infarction (Heart attack) Sister   Medications: albuterol MDI, PROVENTIL, VENTOLIN, PROAIR, HFA 90 mcg/actuation inhaler Inhale 2 inhalations into the lungs every 4 (four) hours as needed for Wheezing or Shortness of Breath  clonazePAM (KLONOPIN) 0.5 MG tablet Take 0.5 mg by mouth at bedtime as needed for Anxiety  cyanocobalamin (VITAMIN B12) 1,000 mcg/mL injection Inject into the muscle monthly  dicyclomine (BENTYL) 10 mg capsule Take 1 capsule (10 mg total) by mouth 3 (three) times daily as needed 90 capsule 1  nortriptyline (PAMELOR) 25 MG capsule Take 25 mg by mouth nightly  omeprazole (PRILOSEC) 40 MG DR capsule Take 40 mg by mouth 2 (two) times daily before meals  ondansetron (ZOFRAN-ODT) 4 MG disintegrating tablet Take 1 tablet (4 mg total) by mouth 4 (four) times daily as needed for Nausea 30 tablet 2  rosuvastatin (CRESTOR) 10 MG tablet Take 10 mg by mouth once daily  sertraline (ZOLOFT) 100 MG tablet Take 100 mg by mouth once daily  traZODone (DESYREL) 100 MG tablet Take 100 mg by mouth at bedtime  Allergies: Eliquis [Apixaban] Nausea and Dizziness  Meloxicam Itching  Sulfa (Sulfonamide  Antibiotics) Rash   Review of Systems A comprehensive 14 point ROS was performed, reviewed, and the pertinent orthopaedic findings are documented in the HPI.  Physical Exam: BP 110/70 (BP Location: Left upper arm, Patient Position: Sitting, BP Cuff Size: Adult)  Ht 160 cm (5\' 3" )  Wt (!) 40.5 kg (89 lb 3.2 oz)  BMI 15.80 kg/m   General: Well-developed well-nourished female seen in no acute distress.   HEENT: Atraumatic,normocephalic. Pupils are equal and reactive to light. Oropharynx is clear with moist mucosa  Lungs: Clear to auscultation bilaterally   Cardiovascular: Regular rate and rhythm. Normal S1, S2. No murmurs. No appreciable gallops or rubs. Peripheral pulses are palpable.  Abdomen: Soft, non-tender, nondistended. Bowel sounds present  Right knee exam: The patient presents today without any significant limp favoring the right leg. Skin examination of the right knee demonstrates no open wound, erythema or ecchymosis. There is no significant effusion identified to the right knee. The patient does have pain with palpation along the medial and lateral joint line of the right knee. The patient is able to fully extend with underlying crepitus. The patient is able to flex close to 100 degrees of moderate pain. The patient does have moderate pain on palpation along the popliteal fossa. A moderate-sized Baker's cyst can be palpated in this area. The patient is intact to light touch about the right lower extremity   Neurological: The patient is alert and oriented Sensation to light touch appears to be intact and within normal limits Gross motor strength appeared to be equal to 5/5  Vascular: Peripheral pulses felt to be palpable. Capillary refill appears to be intact and within normal limits  X-ray Imaging: X-rays taken in [facility] on 08/26/2023 demonstrated significant calcific changes involving both the medial and lateral meniscus indicative of previous history of  pseudogout. There is subchondral sclerosis present to the medial tibial plateau. Osteophyte formation off of the superior aspect of the right patella. There does not appear to be significant joint space loss to the patellofemoral compartment at this time  Right LOWER EXTREMITY VENOUS DOPPLER ULTRASOUND:  Chronic appearing nonocclusive thrombus in the common femoral and saphenofemoral junction. No acute DVT otherwise identified in the right lower extremity. Possible Baker's cyst. Confirmatory study as clinically appropriate.  Impression: 1. Degenerative arthrosis right knee  Plan:  1    H&P reviewed and patient re-examined. No changes.

## 2023-12-02 NOTE — Anesthesia Preprocedure Evaluation (Addendum)
Anesthesia Evaluation  Patient identified by MRN, date of birth, ID band Patient awake    Reviewed: Allergy & Precautions, NPO status , Patient's Chart, lab work & pertinent test results  History of Anesthesia Complications Negative for: history of anesthetic complications  Airway Mallampati: II   Neck ROM: Full    Dental no notable dental hx.    Pulmonary sleep apnea , COPD, Current Smoker (1 ppd) and Patient abstained from smoking.   Pulmonary exam normal breath sounds clear to auscultation       Cardiovascular + CAD  Normal cardiovascular exam Rhythm:Regular Rate:Normal  ECG 11/27/23: normal  Myocardial perfusion 01/14/23:  Normal myocardial fusion scan no evidence of stress-induced myocardial ischemia ejection fraction of 65% conclusion negative scan   Echo 01/14/23:  NORMAL LEFT VENTRICULAR SYSTOLIC FUNCTION WITH AN ESTIMATED EF = >55 %  NORMAL RIGHT VENTRICULAR SYSTOLIC FUNCTION  MILD-TO-MODERATE TRICUSPID VALVE INSUFFICIENCY  MILD MITRAL VALVE INSUFFICIENCY  NO VALVULAR STENOSIS  TECHNICALLY DIFFICULT STUDY     Neuro/Psych  Headaches PSYCHIATRIC DISORDERS Anxiety Depression       GI/Hepatic hiatal hernia,GERD  ,,  Endo/Other  negative endocrine ROS    Renal/GU Renal disease (nephrolithiasis)     Musculoskeletal   Abdominal   Peds  Hematology Hx multiple DVTs; hx splenectomy   Anesthesia Other Findings   Reproductive/Obstetrics                             Anesthesia Physical Anesthesia Plan  ASA: 3  Anesthesia Plan: General   Post-op Pain Management:    Induction: Intravenous  PONV Risk Score and Plan: 2 and Treatment may vary due to age or medical condition, Ondansetron and Dexamethasone  Airway Management Planned: Oral ETT  Additional Equipment:   Intra-op Plan:   Post-operative Plan: Extubation in OR  Informed Consent: I have reviewed the patients History and  Physical, chart, labs and discussed the procedure including the risks, benefits and alternatives for the proposed anesthesia with the patient or authorized representative who has indicated his/her understanding and acceptance.     Dental advisory given  Plan Discussed with: CRNA  Anesthesia Plan Comments: (Patient refuses spinal.  Patient consented for risks of anesthesia including but not limited to:  - adverse reactions to medications - damage to eyes, teeth, lips or other oral mucosa - nerve damage due to positioning  - sore throat or hoarseness - damage to heart, brain, nerves, lungs, other parts of body or loss of life  Informed patient about role of CRNA in peri- and intra-operative care.  Patient voiced understanding.)        Anesthesia Quick Evaluation

## 2023-12-02 NOTE — Op Note (Signed)
12/02/2023  9:51 AM  Patient:   Tamara [name]  Pre-Op Diagnosis:   Degenerative joint disease with chondrocalcinosis, right knee.  Post-Op Diagnosis:   Same  Procedure:   Right TKA using all-cemented Zimmer Persona system with a #6 narrow PCR femur, a(n) C-sized  tibial tray with an  11 mm medial congruent E-poly insert, and a 6.2 x 28 mm all-poly 3-pegged domed Vanguard patella.  Surgeon:   Maryagnes Amos, MD  Assistant:   Horris Latino, PA-C; Juanetta Snow, PA-S  Anesthesia:   GET  Findings:   As above  Complications:   None  EBL:   5 cc  Fluids:   300 cc crystalloid  UOP:   None  TT:   95 minutes at 250 mmHg  Drains:   None  Closure:   Staples  Implants:   As above  Brief Clinical Note:   The patient is a 78 year old female with a long history of progressively worsening right knee pain. The patient's symptoms have progressed despite medications, activity modification, injections, etc. The patient's history and examination were consistent with degenerative joint disease and chondrocalcinosis of the right knee confirmed by plain radiographs. The patient presents at this time for a right total knee arthroplasty.  Procedure:   The patient was brought into the operating room. After adequate spinal anesthesia was obtained, the patient was repositioned in the supine position on the operating room table. The right lower extremity was prepped with ChloraPrep solution and draped sterilely. Preoperative antibiotics were administered. A timeout was performed to verify the appropriate surgical site before the limb was exsanguinated with an Esmarch and the tourniquet inflated to 250 mmHg.   A standard anterior approach to the knee was made through an approximately 5-6 inch incision. The incision was carried down through the subcutaneous tissues to expose superficial retinaculum. This was split the length of the incision and the medial flap elevated sufficiently to expose the medial  retinaculum. The medial retinaculum was incised, leaving a 3-4 mm cuff of tissue on the patella. This was extended distally along the medial border of the patellar tendon and proximally through the medial third of the quadriceps tendon. A subtotal fat pad excision was performed before the soft tissues were elevated off the anteromedial and anterolateral aspects of the proximal tibia to the level of the collateral ligaments. The anterior portions of the medial and lateral menisci were removed, as was the anterior cruciate ligament. With the knee flexed to 90, the external tibial guide was positioned and the appropriate proximal tibial cut made. This piece was taken to the back table where it was measured and found to be optimally replicated by a(n) C-sized component.  Attention was directed to the distal femur. The intramedullary canal was accessed through a 3/8" drill hole. The intramedullary guide was inserted and placed at 5 of valgus alignment. Using the +0 slot, the distal cut was made. The distal femur was measured and found to be optimally replicated by the #6 component. The #6 4-in-1 cutting block was positioned and first the posterior, then the posterior chamfer, the anterior, and finally the anterior chamfer cuts were made after verifying that the anterior cortex would not be notched.   At this point, the posterior portions medial and lateral menisci were removed. A trial reduction was performed using the appropriate femoral and tibial components with the  first the 10 mm and then the 11 mm  insert. The 11 mm insert demonstrated excellent stability to varus and  valgus stressing both in flexion and extension while permitting full extension. Patellar tracking was assessed and found to be excellent. Therefore, the tibial trial position was marked on the proximal tibia. The patella thickness was measured and found to be 18 mm. Therefore, the appropriate cut was made. The patellar surface was measured and  found to be optimally replicated by the  28  mm Vanguard component. The three peg holes were drilled in place before the trial button was inserted. Patella tracking was assessed and found to be excellent, passing the "no thumb test". The lug holes were drilled into the distal femur before the trial component was removed.  The tibial tray was repositioned before the keel was created using the appropriate tower, reamer, and punch.  The bony surfaces were prepared for cementing by irrigating them thoroughly with sterile saline solution via the jet lavage system. A bone plug was fashioned from some of the bone that had been removed previously and used to plug the distal femoral canal. In addition, a "cocktail" of 20 cc of Exparel, 30 cc of 0.5% Sensorcaine, 2 cc of Kenalog 40 (80 mg), and 30 mg of Toradol diluted out to 90 cc with normal saline was injected into the postero-medial and postero-lateral aspects of the knee, the medial and lateral gutter regions, and the peri-incisional tissues to help with postoperative analgesia. Meanwhile, the cement was being mixed on the back table.   When the cement was ready, the tibial tray was cemented in first. The excess cement was removed using Personal assistant. Next, the femoral component was impacted into place. Again, the excess cement was removed using Personal assistant. The 11 mm trial insert was positioned and the knee brought into extension while the cement hardened. Finally, the patella was cemented into place and secured using the patellar clamp. Again, the excess cement was removed using Personal assistant. Once the cement had hardened, the knee was placed through a range of motion with the findings as described above. Therefore, the trial insert was removed and, after verifying that no cement had been retained posteriorly, the permanent 11 mm medial congruent E-polyethylene insert was snapped into place with care taken to ensure appropriate locking of the insert. Again  the knee was placed through a range of motion with the findings as described above.  The wound was copiously irrigated with sterile saline solution using the jet lavage system before the quadriceps tendon and retinacular layer were reapproximated using #0 Vicryl interrupted sutures. The superficial retinacular layer also was closed using a running #0 Vicryl suture. The subcutaneous tissues were closed in several layers using 2-0 Vicryl interrupted sutures. The skin was closed using staples. A sterile honeycomb dressing was applied to the skin before the leg was wrapped with an Ace wrap to accommodate the Polar Care device. The patient was then awakened, extubated, and returned to the recovery room in satisfactory condition after tolerating the procedure well.

## 2023-12-02 NOTE — Anesthesia Procedure Notes (Signed)
Procedure Name: Intubation Date/Time: 12/02/2023 7:40 AM  Performed by: Marcy Siren, RNPre-anesthesia Checklist: Patient identified, Emergency Drugs available, Suction available and Patient being monitored Patient Re-evaluated:Patient Re-evaluated prior to induction Oxygen Delivery Method: Circle system utilized Preoxygenation: Pre-oxygenation with 100% oxygen Induction Type: IV induction Ventilation: Mask ventilation without difficulty Laryngoscope Size: McGrath and 3 Grade View: Grade III Tube type: Oral Tube size: 6.5 mm Number of attempts: 1 Airway Equipment and Method: Stylet and Video-laryngoscopy Placement Confirmation: ETT inserted through vocal cords under direct vision, positive ETCO2 and breath sounds checked- equal and bilateral Secured at: 19 cm Tube secured with: Tape Dental Injury: Teeth and Oropharynx as per pre-operative assessment  Difficulty Due To: Difficulty was unanticipated

## 2023-12-02 NOTE — Discharge Instructions (Addendum)
Orthopedic discharge instructions: May shower with intact OpSite dressing. Apply ice frequently to knee or use Polar Care. Start Xarelto 1 tablet (10 mg) once daily on Wednesday, 12/03/2023, for 2 weeks, then take EC-aspirin 325 mg daily for 4 weeks. Take pain medication as prescribed when needed.  May supplement with ES Tylenol if necessary. May weight-bear as tolerated on right leg - use walker for balance and support. Follow-up in 10-14 days or as scheduled.   Information for Discharge Teaching: EXPAREL (bupivacaine liposome injectable suspension)   Pain relief is important to your recovery. The goal is to control your pain so you can move easier and return to your normal activities as soon as possible after your procedure. Your physician may use several types of medicines to manage pain, swelling, and more.  Your surgeon or anesthesiologist gave you EXPAREL(bupivacaine) to help control your pain after surgery.  EXPAREL is a local anesthetic designed to release slowly over an extended period of time to provide pain relief by numbing the tissue around the surgical site. EXPAREL is designed to release pain medication over time and can control pain for up to 72 hours. Depending on how you respond to EXPAREL, you may require less pain medication during your recovery. EXPAREL can help reduce or eliminate the need for opioids during the first few days after surgery when pain relief is needed the most. EXPAREL is not an opioid and is not addictive. It does not cause sleepiness or sedation.   Important! A teal colored band has been placed on your arm with the date, time and amount of EXPAREL you have received. Please leave this armband in place for the full 96 hours following administration, and then you may remove the band. If you return to the hospital for any reason within 96 hours following the administration of EXPAREL, the armband provides important information that your health care providers  to know, and alerts them that you have received this anesthetic.    Possible side effects of EXPAREL: Temporary loss of sensation or ability to move in the area where medication was injected. Nausea, vomiting, constipation Rarely, numbness and tingling in your mouth or lips, lightheadedness, or anxiety may occur. Call your doctor right away if you think you may be experiencing any of these sensations, or if you have other questions regarding possible side effects.  Follow all other discharge instructions given to you by your surgeon or nurse. Eat a healthy diet and drink plenty of water or other fluids.   POLAR CARE INFORMATION  MassAdvertisement.it  How to use [employer]reg Polar Care [facility] Therapy System?  YouTube   ShippingScam.co.uk  OPERATING INSTRUCTIONS  Start the product With dry hands, connect the transformer to the electrical connection located on the top of the cooler. Next, plug the transformer into an appropriate electrical outlet. The unit will automatically start running at this point.  To stop the pump, disconnect electrical power.  Unplug to stop the product when not in use. Unplugging the Polar Care unit turns it off. Always unplug immediately after use. Never leave it plugged in while unattended. Remove pad.    FIRST ADD WATER TO FILL LINE, THEN ICE---Replace ice when existing ice is almost melted  1 Discuss Treatment with your Licensed Health Care Practitioner and Use Only as Prescribed 2 Apply Insulation Barrier & Cold Therapy Pad 3 Check for Moisture 4 Inspect Skin Regularly  Tips and Trouble Shooting Usage Tips 1. Use cubed or chunked ice for optimal performance. 2. It  is recommended to drain the Pad between uses. To drain the pad, hold the Pad upright with the hose pointed toward the ground. Depress the black plunger and allow water to drain out. 3. You may disconnect the Pad from the unit without removing the pad from the affected area by  depressing the silver tabs on the hose coupling and gently pulling the hoses apart. The Pad and unit will seal itself and will not leak. Note: Some dripping during release is normal. 4. DO NOT RUN PUMP WITHOUT WATER! The pump in this unit is designed to run with water. Running the unit without water will cause permanent damage to the pump. 5. Unplug unit before removing lid.  TROUBLESHOOTING GUIDE Pump not running, Water not flowing to the pad, Pad is not getting cold 1. Make sure the transformer is plugged into the wall outlet. 2. Confirm that the ice and water are filled to the indicated levels. 3. Make sure there are no kinks in the pad. 4. Gently pull on the blue tube to make sure the tube/pad junction is straight. 5. Remove the pad from the treatment site and ll it while the pad is lying at; then reapply. 6. Confirm that the pad couplings are securely attached to the unit. Listen for the double clicks (Figure 1) to confirm the pad couplings are securely attached.  Leaks    Note: Some condensation on the lines, controller, and pads is unavoidable, especially in warmer climates. 1. If using a Breg Polar Care Cold Therapy unit with a detachable Cold Therapy Pad, and a leak exists (other than condensation on the lines) disconnect the pad couplings. Make sure the silver tabs on the couplings are depressed before reconnecting the pad to the pump hose; then confirm both sides of the coupling are properly clicked in. 2. If the coupling continues to leak or a leak is detected in the pad itself, stop using it and call Breg Customer Care at [phone].  Cleaning After use, empty and dry the unit with a soft cloth. Warm water and mild detergent may be used occasionally to clean the pump and tubes.  WARNING: The Polar Care Cube can be cold enough to cause serious injury, including full skin necrosis. Follow these Operating Instructions, and carefully read the Product Insert (see pouch on side of  unit) and the Cold Therapy Pad Fitting Instructions (provided with each Cold Therapy Pad) prior to use.    [facility].  [address] , [place], [place], [address]. [phone] They will call you to arrange when they can come to see you

## 2023-12-03 ENCOUNTER — Encounter: Payer: Self-pay | Admitting: Surgery

## 2023-12-04 DIAGNOSIS — H269 Unspecified cataract: Secondary | ICD-10-CM | POA: Diagnosis not present

## 2023-12-04 DIAGNOSIS — Z96651 Presence of right artificial knee joint: Secondary | ICD-10-CM | POA: Diagnosis not present

## 2023-12-04 DIAGNOSIS — F17200 Nicotine dependence, unspecified, uncomplicated: Secondary | ICD-10-CM | POA: Diagnosis not present

## 2023-12-04 DIAGNOSIS — G43719 Chronic migraine without aura, intractable, without status migrainosus: Secondary | ICD-10-CM | POA: Diagnosis not present

## 2023-12-04 DIAGNOSIS — K219 Gastro-esophageal reflux disease without esophagitis: Secondary | ICD-10-CM | POA: Diagnosis not present

## 2023-12-04 DIAGNOSIS — E46 Unspecified protein-calorie malnutrition: Secondary | ICD-10-CM | POA: Diagnosis not present

## 2023-12-04 DIAGNOSIS — Z471 Aftercare following joint replacement surgery: Secondary | ICD-10-CM | POA: Diagnosis not present

## 2023-12-04 DIAGNOSIS — I700 Atherosclerosis of aorta: Secondary | ICD-10-CM | POA: Diagnosis not present

## 2023-12-04 DIAGNOSIS — M48062 Spinal stenosis, lumbar region with neurogenic claudication: Secondary | ICD-10-CM | POA: Diagnosis not present

## 2023-12-04 DIAGNOSIS — K5000 Crohn's disease of small intestine without complications: Secondary | ICD-10-CM | POA: Diagnosis not present

## 2023-12-04 DIAGNOSIS — G4733 Obstructive sleep apnea (adult) (pediatric): Secondary | ICD-10-CM | POA: Diagnosis not present

## 2023-12-04 DIAGNOSIS — Z981 Arthrodesis status: Secondary | ICD-10-CM | POA: Diagnosis not present

## 2023-12-04 DIAGNOSIS — I951 Orthostatic hypotension: Secondary | ICD-10-CM | POA: Diagnosis not present

## 2023-12-04 DIAGNOSIS — Z7901 Long term (current) use of anticoagulants: Secondary | ICD-10-CM | POA: Diagnosis not present

## 2023-12-04 DIAGNOSIS — M1712 Unilateral primary osteoarthritis, left knee: Secondary | ICD-10-CM | POA: Diagnosis not present

## 2023-12-04 DIAGNOSIS — I071 Rheumatic tricuspid insufficiency: Secondary | ICD-10-CM | POA: Diagnosis not present

## 2023-12-04 DIAGNOSIS — E538 Deficiency of other specified B group vitamins: Secondary | ICD-10-CM | POA: Diagnosis not present

## 2023-12-04 DIAGNOSIS — R1314 Dysphagia, pharyngoesophageal phase: Secondary | ICD-10-CM | POA: Diagnosis not present

## 2023-12-04 DIAGNOSIS — J42 Unspecified chronic bronchitis: Secondary | ICD-10-CM | POA: Diagnosis not present

## 2023-12-04 DIAGNOSIS — G4700 Insomnia, unspecified: Secondary | ICD-10-CM | POA: Diagnosis not present

## 2023-12-04 DIAGNOSIS — E559 Vitamin D deficiency, unspecified: Secondary | ICD-10-CM | POA: Diagnosis not present

## 2023-12-04 DIAGNOSIS — F411 Generalized anxiety disorder: Secondary | ICD-10-CM | POA: Diagnosis not present

## 2023-12-04 DIAGNOSIS — J439 Emphysema, unspecified: Secondary | ICD-10-CM | POA: Diagnosis not present

## 2023-12-04 DIAGNOSIS — M1000 Idiopathic gout, unspecified site: Secondary | ICD-10-CM | POA: Diagnosis not present

## 2023-12-06 DIAGNOSIS — M48062 Spinal stenosis, lumbar region with neurogenic claudication: Secondary | ICD-10-CM | POA: Diagnosis not present

## 2023-12-06 DIAGNOSIS — E538 Deficiency of other specified B group vitamins: Secondary | ICD-10-CM | POA: Diagnosis not present

## 2023-12-06 DIAGNOSIS — E559 Vitamin D deficiency, unspecified: Secondary | ICD-10-CM | POA: Diagnosis not present

## 2023-12-06 DIAGNOSIS — I071 Rheumatic tricuspid insufficiency: Secondary | ICD-10-CM | POA: Diagnosis not present

## 2023-12-06 DIAGNOSIS — Z981 Arthrodesis status: Secondary | ICD-10-CM | POA: Diagnosis not present

## 2023-12-06 DIAGNOSIS — M1000 Idiopathic gout, unspecified site: Secondary | ICD-10-CM | POA: Diagnosis not present

## 2023-12-06 DIAGNOSIS — K5000 Crohn's disease of small intestine without complications: Secondary | ICD-10-CM | POA: Diagnosis not present

## 2023-12-06 DIAGNOSIS — Z96651 Presence of right artificial knee joint: Secondary | ICD-10-CM | POA: Diagnosis not present

## 2023-12-06 DIAGNOSIS — I700 Atherosclerosis of aorta: Secondary | ICD-10-CM | POA: Diagnosis not present

## 2023-12-06 DIAGNOSIS — J439 Emphysema, unspecified: Secondary | ICD-10-CM | POA: Diagnosis not present

## 2023-12-06 DIAGNOSIS — Z471 Aftercare following joint replacement surgery: Secondary | ICD-10-CM | POA: Diagnosis not present

## 2023-12-06 DIAGNOSIS — I951 Orthostatic hypotension: Secondary | ICD-10-CM | POA: Diagnosis not present

## 2023-12-06 DIAGNOSIS — G4733 Obstructive sleep apnea (adult) (pediatric): Secondary | ICD-10-CM | POA: Diagnosis not present

## 2023-12-06 DIAGNOSIS — K219 Gastro-esophageal reflux disease without esophagitis: Secondary | ICD-10-CM | POA: Diagnosis not present

## 2023-12-06 DIAGNOSIS — F411 Generalized anxiety disorder: Secondary | ICD-10-CM | POA: Diagnosis not present

## 2023-12-06 DIAGNOSIS — E46 Unspecified protein-calorie malnutrition: Secondary | ICD-10-CM | POA: Diagnosis not present

## 2023-12-06 DIAGNOSIS — Z7901 Long term (current) use of anticoagulants: Secondary | ICD-10-CM | POA: Diagnosis not present

## 2023-12-06 DIAGNOSIS — H269 Unspecified cataract: Secondary | ICD-10-CM | POA: Diagnosis not present

## 2023-12-06 DIAGNOSIS — M1712 Unilateral primary osteoarthritis, left knee: Secondary | ICD-10-CM | POA: Diagnosis not present

## 2023-12-06 DIAGNOSIS — R1314 Dysphagia, pharyngoesophageal phase: Secondary | ICD-10-CM | POA: Diagnosis not present

## 2023-12-06 DIAGNOSIS — F17200 Nicotine dependence, unspecified, uncomplicated: Secondary | ICD-10-CM | POA: Diagnosis not present

## 2023-12-06 DIAGNOSIS — G4700 Insomnia, unspecified: Secondary | ICD-10-CM | POA: Diagnosis not present

## 2023-12-06 DIAGNOSIS — G43719 Chronic migraine without aura, intractable, without status migrainosus: Secondary | ICD-10-CM | POA: Diagnosis not present

## 2023-12-06 DIAGNOSIS — J42 Unspecified chronic bronchitis: Secondary | ICD-10-CM | POA: Diagnosis not present

## 2023-12-08 DIAGNOSIS — M48062 Spinal stenosis, lumbar region with neurogenic claudication: Secondary | ICD-10-CM | POA: Diagnosis not present

## 2023-12-08 DIAGNOSIS — Z981 Arthrodesis status: Secondary | ICD-10-CM | POA: Diagnosis not present

## 2023-12-08 DIAGNOSIS — I951 Orthostatic hypotension: Secondary | ICD-10-CM | POA: Diagnosis not present

## 2023-12-08 DIAGNOSIS — M1000 Idiopathic gout, unspecified site: Secondary | ICD-10-CM | POA: Diagnosis not present

## 2023-12-08 DIAGNOSIS — F411 Generalized anxiety disorder: Secondary | ICD-10-CM | POA: Diagnosis not present

## 2023-12-08 DIAGNOSIS — E559 Vitamin D deficiency, unspecified: Secondary | ICD-10-CM | POA: Diagnosis not present

## 2023-12-08 DIAGNOSIS — J439 Emphysema, unspecified: Secondary | ICD-10-CM | POA: Diagnosis not present

## 2023-12-08 DIAGNOSIS — E46 Unspecified protein-calorie malnutrition: Secondary | ICD-10-CM | POA: Diagnosis not present

## 2023-12-08 DIAGNOSIS — M1712 Unilateral primary osteoarthritis, left knee: Secondary | ICD-10-CM | POA: Diagnosis not present

## 2023-12-08 DIAGNOSIS — G4733 Obstructive sleep apnea (adult) (pediatric): Secondary | ICD-10-CM | POA: Diagnosis not present

## 2023-12-08 DIAGNOSIS — G4700 Insomnia, unspecified: Secondary | ICD-10-CM | POA: Diagnosis not present

## 2023-12-08 DIAGNOSIS — R1314 Dysphagia, pharyngoesophageal phase: Secondary | ICD-10-CM | POA: Diagnosis not present

## 2023-12-08 DIAGNOSIS — E538 Deficiency of other specified B group vitamins: Secondary | ICD-10-CM | POA: Diagnosis not present

## 2023-12-08 DIAGNOSIS — F17200 Nicotine dependence, unspecified, uncomplicated: Secondary | ICD-10-CM | POA: Diagnosis not present

## 2023-12-08 DIAGNOSIS — K5000 Crohn's disease of small intestine without complications: Secondary | ICD-10-CM | POA: Diagnosis not present

## 2023-12-08 DIAGNOSIS — I071 Rheumatic tricuspid insufficiency: Secondary | ICD-10-CM | POA: Diagnosis not present

## 2023-12-08 DIAGNOSIS — I700 Atherosclerosis of aorta: Secondary | ICD-10-CM | POA: Diagnosis not present

## 2023-12-08 DIAGNOSIS — H269 Unspecified cataract: Secondary | ICD-10-CM | POA: Diagnosis not present

## 2023-12-08 DIAGNOSIS — Z471 Aftercare following joint replacement surgery: Secondary | ICD-10-CM | POA: Diagnosis not present

## 2023-12-08 DIAGNOSIS — J42 Unspecified chronic bronchitis: Secondary | ICD-10-CM | POA: Diagnosis not present

## 2023-12-08 DIAGNOSIS — Z7901 Long term (current) use of anticoagulants: Secondary | ICD-10-CM | POA: Diagnosis not present

## 2023-12-08 DIAGNOSIS — K219 Gastro-esophageal reflux disease without esophagitis: Secondary | ICD-10-CM | POA: Diagnosis not present

## 2023-12-08 DIAGNOSIS — Z96651 Presence of right artificial knee joint: Secondary | ICD-10-CM | POA: Diagnosis not present

## 2023-12-08 DIAGNOSIS — G43719 Chronic migraine without aura, intractable, without status migrainosus: Secondary | ICD-10-CM | POA: Diagnosis not present

## 2023-12-10 DIAGNOSIS — Z981 Arthrodesis status: Secondary | ICD-10-CM | POA: Diagnosis not present

## 2023-12-10 DIAGNOSIS — Z96651 Presence of right artificial knee joint: Secondary | ICD-10-CM | POA: Diagnosis not present

## 2023-12-10 DIAGNOSIS — M1712 Unilateral primary osteoarthritis, left knee: Secondary | ICD-10-CM | POA: Diagnosis not present

## 2023-12-10 DIAGNOSIS — G43719 Chronic migraine without aura, intractable, without status migrainosus: Secondary | ICD-10-CM | POA: Diagnosis not present

## 2023-12-10 DIAGNOSIS — I951 Orthostatic hypotension: Secondary | ICD-10-CM | POA: Diagnosis not present

## 2023-12-10 DIAGNOSIS — E559 Vitamin D deficiency, unspecified: Secondary | ICD-10-CM | POA: Diagnosis not present

## 2023-12-10 DIAGNOSIS — Z471 Aftercare following joint replacement surgery: Secondary | ICD-10-CM | POA: Diagnosis not present

## 2023-12-10 DIAGNOSIS — F17200 Nicotine dependence, unspecified, uncomplicated: Secondary | ICD-10-CM | POA: Diagnosis not present

## 2023-12-10 DIAGNOSIS — G4700 Insomnia, unspecified: Secondary | ICD-10-CM | POA: Diagnosis not present

## 2023-12-10 DIAGNOSIS — J42 Unspecified chronic bronchitis: Secondary | ICD-10-CM | POA: Diagnosis not present

## 2023-12-10 DIAGNOSIS — E538 Deficiency of other specified B group vitamins: Secondary | ICD-10-CM | POA: Diagnosis not present

## 2023-12-10 DIAGNOSIS — M1000 Idiopathic gout, unspecified site: Secondary | ICD-10-CM | POA: Diagnosis not present

## 2023-12-10 DIAGNOSIS — K5000 Crohn's disease of small intestine without complications: Secondary | ICD-10-CM | POA: Diagnosis not present

## 2023-12-10 DIAGNOSIS — I071 Rheumatic tricuspid insufficiency: Secondary | ICD-10-CM | POA: Diagnosis not present

## 2023-12-10 DIAGNOSIS — Z7901 Long term (current) use of anticoagulants: Secondary | ICD-10-CM | POA: Diagnosis not present

## 2023-12-10 DIAGNOSIS — E46 Unspecified protein-calorie malnutrition: Secondary | ICD-10-CM | POA: Diagnosis not present

## 2023-12-10 DIAGNOSIS — I700 Atherosclerosis of aorta: Secondary | ICD-10-CM | POA: Diagnosis not present

## 2023-12-10 DIAGNOSIS — R1314 Dysphagia, pharyngoesophageal phase: Secondary | ICD-10-CM | POA: Diagnosis not present

## 2023-12-10 DIAGNOSIS — H269 Unspecified cataract: Secondary | ICD-10-CM | POA: Diagnosis not present

## 2023-12-10 DIAGNOSIS — G4733 Obstructive sleep apnea (adult) (pediatric): Secondary | ICD-10-CM | POA: Diagnosis not present

## 2023-12-10 DIAGNOSIS — M48062 Spinal stenosis, lumbar region with neurogenic claudication: Secondary | ICD-10-CM | POA: Diagnosis not present

## 2023-12-10 DIAGNOSIS — F411 Generalized anxiety disorder: Secondary | ICD-10-CM | POA: Diagnosis not present

## 2023-12-10 DIAGNOSIS — J439 Emphysema, unspecified: Secondary | ICD-10-CM | POA: Diagnosis not present

## 2023-12-10 DIAGNOSIS — K219 Gastro-esophageal reflux disease without esophagitis: Secondary | ICD-10-CM | POA: Diagnosis not present

## 2023-12-12 DIAGNOSIS — J42 Unspecified chronic bronchitis: Secondary | ICD-10-CM | POA: Diagnosis not present

## 2023-12-12 DIAGNOSIS — R1314 Dysphagia, pharyngoesophageal phase: Secondary | ICD-10-CM | POA: Diagnosis not present

## 2023-12-12 DIAGNOSIS — E559 Vitamin D deficiency, unspecified: Secondary | ICD-10-CM | POA: Diagnosis not present

## 2023-12-12 DIAGNOSIS — K5000 Crohn's disease of small intestine without complications: Secondary | ICD-10-CM | POA: Diagnosis not present

## 2023-12-12 DIAGNOSIS — Z96651 Presence of right artificial knee joint: Secondary | ICD-10-CM | POA: Diagnosis not present

## 2023-12-12 DIAGNOSIS — M1712 Unilateral primary osteoarthritis, left knee: Secondary | ICD-10-CM | POA: Diagnosis not present

## 2023-12-12 DIAGNOSIS — G4700 Insomnia, unspecified: Secondary | ICD-10-CM | POA: Diagnosis not present

## 2023-12-12 DIAGNOSIS — G43719 Chronic migraine without aura, intractable, without status migrainosus: Secondary | ICD-10-CM | POA: Diagnosis not present

## 2023-12-12 DIAGNOSIS — E538 Deficiency of other specified B group vitamins: Secondary | ICD-10-CM | POA: Diagnosis not present

## 2023-12-12 DIAGNOSIS — Z471 Aftercare following joint replacement surgery: Secondary | ICD-10-CM | POA: Diagnosis not present

## 2023-12-12 DIAGNOSIS — I071 Rheumatic tricuspid insufficiency: Secondary | ICD-10-CM | POA: Diagnosis not present

## 2023-12-12 DIAGNOSIS — M48062 Spinal stenosis, lumbar region with neurogenic claudication: Secondary | ICD-10-CM | POA: Diagnosis not present

## 2023-12-12 DIAGNOSIS — I951 Orthostatic hypotension: Secondary | ICD-10-CM | POA: Diagnosis not present

## 2023-12-12 DIAGNOSIS — G4733 Obstructive sleep apnea (adult) (pediatric): Secondary | ICD-10-CM | POA: Diagnosis not present

## 2023-12-12 DIAGNOSIS — I700 Atherosclerosis of aorta: Secondary | ICD-10-CM | POA: Diagnosis not present

## 2023-12-12 DIAGNOSIS — H269 Unspecified cataract: Secondary | ICD-10-CM | POA: Diagnosis not present

## 2023-12-12 DIAGNOSIS — J439 Emphysema, unspecified: Secondary | ICD-10-CM | POA: Diagnosis not present

## 2023-12-12 DIAGNOSIS — M1000 Idiopathic gout, unspecified site: Secondary | ICD-10-CM | POA: Diagnosis not present

## 2023-12-12 DIAGNOSIS — F17200 Nicotine dependence, unspecified, uncomplicated: Secondary | ICD-10-CM | POA: Diagnosis not present

## 2023-12-12 DIAGNOSIS — Z7901 Long term (current) use of anticoagulants: Secondary | ICD-10-CM | POA: Diagnosis not present

## 2023-12-12 DIAGNOSIS — E46 Unspecified protein-calorie malnutrition: Secondary | ICD-10-CM | POA: Diagnosis not present

## 2023-12-12 DIAGNOSIS — F411 Generalized anxiety disorder: Secondary | ICD-10-CM | POA: Diagnosis not present

## 2023-12-12 DIAGNOSIS — Z981 Arthrodesis status: Secondary | ICD-10-CM | POA: Diagnosis not present

## 2023-12-12 DIAGNOSIS — K219 Gastro-esophageal reflux disease without esophagitis: Secondary | ICD-10-CM | POA: Diagnosis not present

## 2023-12-15 DIAGNOSIS — Z471 Aftercare following joint replacement surgery: Secondary | ICD-10-CM | POA: Diagnosis not present

## 2023-12-16 DIAGNOSIS — G4700 Insomnia, unspecified: Secondary | ICD-10-CM | POA: Diagnosis not present

## 2023-12-16 DIAGNOSIS — Z981 Arthrodesis status: Secondary | ICD-10-CM | POA: Diagnosis not present

## 2023-12-16 DIAGNOSIS — F17200 Nicotine dependence, unspecified, uncomplicated: Secondary | ICD-10-CM | POA: Diagnosis not present

## 2023-12-16 DIAGNOSIS — H269 Unspecified cataract: Secondary | ICD-10-CM | POA: Diagnosis not present

## 2023-12-16 DIAGNOSIS — I951 Orthostatic hypotension: Secondary | ICD-10-CM | POA: Diagnosis not present

## 2023-12-16 DIAGNOSIS — I700 Atherosclerosis of aorta: Secondary | ICD-10-CM | POA: Diagnosis not present

## 2023-12-16 DIAGNOSIS — M1000 Idiopathic gout, unspecified site: Secondary | ICD-10-CM | POA: Diagnosis not present

## 2023-12-16 DIAGNOSIS — K5000 Crohn's disease of small intestine without complications: Secondary | ICD-10-CM | POA: Diagnosis not present

## 2023-12-16 DIAGNOSIS — J42 Unspecified chronic bronchitis: Secondary | ICD-10-CM | POA: Diagnosis not present

## 2023-12-16 DIAGNOSIS — R1314 Dysphagia, pharyngoesophageal phase: Secondary | ICD-10-CM | POA: Diagnosis not present

## 2023-12-16 DIAGNOSIS — Z7901 Long term (current) use of anticoagulants: Secondary | ICD-10-CM | POA: Diagnosis not present

## 2023-12-16 DIAGNOSIS — K219 Gastro-esophageal reflux disease without esophagitis: Secondary | ICD-10-CM | POA: Diagnosis not present

## 2023-12-16 DIAGNOSIS — Z96651 Presence of right artificial knee joint: Secondary | ICD-10-CM | POA: Diagnosis not present

## 2023-12-16 DIAGNOSIS — J439 Emphysema, unspecified: Secondary | ICD-10-CM | POA: Diagnosis not present

## 2023-12-16 DIAGNOSIS — Z471 Aftercare following joint replacement surgery: Secondary | ICD-10-CM | POA: Diagnosis not present

## 2023-12-16 DIAGNOSIS — E538 Deficiency of other specified B group vitamins: Secondary | ICD-10-CM | POA: Diagnosis not present

## 2023-12-16 DIAGNOSIS — F411 Generalized anxiety disorder: Secondary | ICD-10-CM | POA: Diagnosis not present

## 2023-12-16 DIAGNOSIS — M1712 Unilateral primary osteoarthritis, left knee: Secondary | ICD-10-CM | POA: Diagnosis not present

## 2023-12-16 DIAGNOSIS — G4733 Obstructive sleep apnea (adult) (pediatric): Secondary | ICD-10-CM | POA: Diagnosis not present

## 2023-12-16 DIAGNOSIS — G43719 Chronic migraine without aura, intractable, without status migrainosus: Secondary | ICD-10-CM | POA: Diagnosis not present

## 2023-12-16 DIAGNOSIS — M48062 Spinal stenosis, lumbar region with neurogenic claudication: Secondary | ICD-10-CM | POA: Diagnosis not present

## 2023-12-16 DIAGNOSIS — E559 Vitamin D deficiency, unspecified: Secondary | ICD-10-CM | POA: Diagnosis not present

## 2023-12-16 DIAGNOSIS — E46 Unspecified protein-calorie malnutrition: Secondary | ICD-10-CM | POA: Diagnosis not present

## 2023-12-16 DIAGNOSIS — I071 Rheumatic tricuspid insufficiency: Secondary | ICD-10-CM | POA: Diagnosis not present

## 2023-12-17 ENCOUNTER — Other Ambulatory Visit: Payer: Self-pay | Admitting: Internal Medicine

## 2023-12-17 DIAGNOSIS — M25561 Pain in right knee: Secondary | ICD-10-CM | POA: Diagnosis not present

## 2023-12-17 DIAGNOSIS — Z96651 Presence of right artificial knee joint: Secondary | ICD-10-CM | POA: Diagnosis not present

## 2023-12-19 DIAGNOSIS — Z96651 Presence of right artificial knee joint: Secondary | ICD-10-CM | POA: Diagnosis not present

## 2023-12-22 DIAGNOSIS — Z96651 Presence of right artificial knee joint: Secondary | ICD-10-CM | POA: Diagnosis not present

## 2023-12-22 DIAGNOSIS — M25561 Pain in right knee: Secondary | ICD-10-CM | POA: Diagnosis not present

## 2023-12-24 DIAGNOSIS — Z96651 Presence of right artificial knee joint: Secondary | ICD-10-CM | POA: Diagnosis not present

## 2023-12-26 ENCOUNTER — Other Ambulatory Visit: Payer: Self-pay | Admitting: Internal Medicine

## 2023-12-26 DIAGNOSIS — Z96651 Presence of right artificial knee joint: Secondary | ICD-10-CM | POA: Diagnosis not present

## 2023-12-26 DIAGNOSIS — M25561 Pain in right knee: Secondary | ICD-10-CM | POA: Diagnosis not present

## 2023-12-29 DIAGNOSIS — M25561 Pain in right knee: Secondary | ICD-10-CM | POA: Diagnosis not present

## 2023-12-29 DIAGNOSIS — Z96651 Presence of right artificial knee joint: Secondary | ICD-10-CM | POA: Diagnosis not present

## 2024-01-02 DIAGNOSIS — Z96651 Presence of right artificial knee joint: Secondary | ICD-10-CM | POA: Diagnosis not present

## 2024-01-12 ENCOUNTER — Other Ambulatory Visit: Payer: Self-pay | Admitting: Surgery

## 2024-01-12 DIAGNOSIS — M25561 Pain in right knee: Secondary | ICD-10-CM | POA: Diagnosis not present

## 2024-01-12 DIAGNOSIS — G8929 Other chronic pain: Secondary | ICD-10-CM | POA: Diagnosis not present

## 2024-01-13 ENCOUNTER — Ambulatory Visit: Admitting: Anesthesiology

## 2024-01-13 ENCOUNTER — Ambulatory Visit
Admission: RE | Admit: 2024-01-13 | Discharge: 2024-01-13 | Disposition: A | Discharge status: Home / Self Care | Attending: Surgery | Admitting: Surgery

## 2024-01-13 ENCOUNTER — Encounter
Admission: RE | Disposition: A | Discharge status: Home / Self Care | Payer: Self-pay | Source: Home / Self Care | Attending: Surgery

## 2024-01-13 ENCOUNTER — Other Ambulatory Visit: Payer: Self-pay

## 2024-01-13 ENCOUNTER — Encounter: Payer: Self-pay | Admitting: Surgery

## 2024-01-13 DIAGNOSIS — I2510 Atherosclerotic heart disease of native coronary artery without angina pectoris: Secondary | ICD-10-CM | POA: Diagnosis not present

## 2024-01-13 DIAGNOSIS — Z8673 Personal history of transient ischemic attack (TIA), and cerebral infarction without residual deficits: Secondary | ICD-10-CM | POA: Insufficient documentation

## 2024-01-13 DIAGNOSIS — Y831 Surgical operation with implant of artificial internal device as the cause of abnormal reaction of the patient, or of later complication, without mention of misadventure at the time of the procedure: Secondary | ICD-10-CM | POA: Insufficient documentation

## 2024-01-13 DIAGNOSIS — K449 Diaphragmatic hernia without obstruction or gangrene: Secondary | ICD-10-CM | POA: Diagnosis not present

## 2024-01-13 DIAGNOSIS — Z419 Encounter for procedure for purposes other than remedying health state, unspecified: Secondary | ICD-10-CM

## 2024-01-13 DIAGNOSIS — K219 Gastro-esophageal reflux disease without esophagitis: Secondary | ICD-10-CM | POA: Diagnosis not present

## 2024-01-13 DIAGNOSIS — T8131XA Disruption of external operation (surgical) wound, not elsewhere classified, initial encounter: Secondary | ICD-10-CM | POA: Diagnosis not present

## 2024-01-13 DIAGNOSIS — F17210 Nicotine dependence, cigarettes, uncomplicated: Secondary | ICD-10-CM | POA: Insufficient documentation

## 2024-01-13 DIAGNOSIS — Z96651 Presence of right artificial knee joint: Secondary | ICD-10-CM | POA: Diagnosis not present

## 2024-01-13 DIAGNOSIS — F17200 Nicotine dependence, unspecified, uncomplicated: Secondary | ICD-10-CM | POA: Diagnosis not present

## 2024-01-13 DIAGNOSIS — T8130XA Disruption of wound, unspecified, initial encounter: Secondary | ICD-10-CM | POA: Diagnosis not present

## 2024-01-13 HISTORY — PX: I & D EXTREMITY: SHX5045

## 2024-01-13 HISTORY — PX: APPLICATION OF WOUND VAC: SHX5189

## 2024-01-13 SURGERY — IRRIGATION AND DEBRIDEMENT EXTREMITY
Anesthesia: General | Site: Knee | Laterality: Right

## 2024-01-13 MED ORDER — BUPIVACAINE LIPOSOME 1.3 % IJ SUSP
INTRAMUSCULAR | Status: AC
  Filled 2024-01-13: qty 10

## 2024-01-13 MED ORDER — FENTANYL CITRATE (PF) 100 MCG/2ML IJ SOLN
25.0000 ug | INTRAMUSCULAR | Status: DC | PRN
  Administered 2024-01-13 (×2): 25 ug via INTRAVENOUS

## 2024-01-13 MED ORDER — FENTANYL CITRATE (PF) 100 MCG/2ML IJ SOLN
INTRAMUSCULAR | Status: DC | PRN
Start: 2024-01-13 — End: 2024-01-13
  Administered 2024-01-13: 25 ug via INTRAVENOUS

## 2024-01-13 MED ORDER — KETOROLAC TROMETHAMINE 15 MG/ML IJ SOLN
INTRAMUSCULAR | Status: AC
  Filled 2024-01-13: qty 1

## 2024-01-13 MED ORDER — FENTANYL CITRATE (PF) 100 MCG/2ML IJ SOLN
INTRAMUSCULAR | Status: AC
  Filled 2024-01-13: qty 2

## 2024-01-13 MED ORDER — CEFAZOLIN SODIUM-DEXTROSE 2-4 GM/100ML-% IV SOLN
2.0000 g | INTRAVENOUS | Status: AC
  Administered 2024-01-13: 2 g via INTRAVENOUS

## 2024-01-13 MED ORDER — CEFAZOLIN SODIUM-DEXTROSE 2-4 GM/100ML-% IV SOLN
INTRAVENOUS | Status: AC
  Filled 2024-01-13: qty 100

## 2024-01-13 MED ORDER — VANCOMYCIN HCL 1000 MG IV SOLR
INTRAVENOUS | Status: DC | PRN
  Administered 2024-01-13: 1000 mg via TOPICAL

## 2024-01-13 MED ORDER — ACETAMINOPHEN 10 MG/ML IV SOLN
INTRAVENOUS | Status: DC | PRN
  Administered 2024-01-13: 1000 mg via INTRAVENOUS

## 2024-01-13 MED ORDER — HYDROCODONE-ACETAMINOPHEN 5-325 MG PO TABS: 1.0000 | ORAL_TABLET | Freq: Four times a day (QID) | ORAL | 0 refills | Status: DC | PRN

## 2024-01-13 MED ORDER — VANCOMYCIN HCL 1000 MG IV SOLR
INTRAVENOUS | Status: AC
  Filled 2024-01-13: qty 20

## 2024-01-13 MED ORDER — DEXAMETHASONE SODIUM PHOSPHATE 10 MG/ML IJ SOLN
INTRAMUSCULAR | Status: DC | PRN
  Administered 2024-01-13: 10 mg via INTRAVENOUS

## 2024-01-13 MED ORDER — LIDOCAINE HCL (CARDIAC) PF 100 MG/5ML IV SOSY
PREFILLED_SYRINGE | INTRAVENOUS | Status: DC | PRN
  Administered 2024-01-13: 60 mg via INTRAVENOUS

## 2024-01-13 MED ORDER — KETOROLAC TROMETHAMINE 15 MG/ML IJ SOLN
15.0000 mg | Freq: Once | INTRAMUSCULAR | Status: AC
  Administered 2024-01-13: 15 mg via INTRAVENOUS

## 2024-01-13 MED ORDER — CHLORHEXIDINE GLUCONATE 0.12 % MT SOLN
OROMUCOSAL | Status: AC
Start: 2024-01-13 — End: ?
  Filled 2024-01-13: qty 15

## 2024-01-13 MED ORDER — BUPIVACAINE-EPINEPHRINE (PF) 0.5% -1:200000 IJ SOLN
INTRAMUSCULAR | Status: AC
  Filled 2024-01-13: qty 30

## 2024-01-13 MED ORDER — ONDANSETRON HCL 4 MG/2ML IJ SOLN
INTRAMUSCULAR | Status: DC | PRN
  Administered 2024-01-13 (×2): 4 mg via INTRAVENOUS

## 2024-01-13 MED ORDER — OXYCODONE HCL 5 MG PO TABS
ORAL_TABLET | ORAL | Status: AC
  Filled 2024-01-13: qty 1

## 2024-01-13 MED ORDER — CHLORHEXIDINE GLUCONATE 0.12 % MT SOLN
15.0000 mL | Freq: Once | OROMUCOSAL | Status: AC
  Administered 2024-01-13: 15 mL via OROMUCOSAL

## 2024-01-13 MED ORDER — LACTATED RINGERS IV SOLN: INTRAVENOUS | Status: DC | PRN

## 2024-01-13 MED ORDER — 0.9 % SODIUM CHLORIDE (POUR BTL) OPTIME
TOPICAL | Status: DC | PRN
Start: 2024-01-13 — End: 2024-01-13
  Administered 2024-01-13: 500 mL

## 2024-01-13 MED ORDER — OXYCODONE HCL 5 MG/5ML PO SOLN: 5.0000 mg | Freq: Once | ORAL | Status: AC | PRN

## 2024-01-13 MED ORDER — CLONAZEPAM 0.5 MG PO TABS: 0.5000 mg | ORAL_TABLET | Freq: Every day | ORAL | Status: DC

## 2024-01-13 MED ORDER — LACTATED RINGERS IV SOLN: INTRAVENOUS | Status: DC

## 2024-01-13 MED ORDER — PROPOFOL 10 MG/ML IV BOLUS
INTRAVENOUS | Status: DC | PRN
  Administered 2024-01-13: 20 mg via INTRAVENOUS
  Administered 2024-01-13: 80 mg via INTRAVENOUS

## 2024-01-13 MED ORDER — DROPERIDOL 2.5 MG/ML IJ SOLN
0.6250 mg | Freq: Once | INTRAMUSCULAR | Status: AC
  Administered 2024-01-13: 0.625 mg via INTRAVENOUS

## 2024-01-13 MED ORDER — BUPIVACAINE HCL (PF) 0.5 % IJ SOLN
INTRAMUSCULAR | Status: AC
  Filled 2024-01-13: qty 30

## 2024-01-13 MED ORDER — GLYCOPYRROLATE 0.2 MG/ML IJ SOLN
INTRAMUSCULAR | Status: DC | PRN
Start: 2024-01-13 — End: 2024-01-13
  Administered 2024-01-13: .2 mg via INTRAVENOUS

## 2024-01-13 MED ORDER — OXYCODONE HCL 5 MG PO TABS
5.0000 mg | ORAL_TABLET | Freq: Once | ORAL | Status: AC | PRN
  Administered 2024-01-13: 5 mg via ORAL

## 2024-01-13 MED ORDER — BUPIVACAINE LIPOSOME 1.3 % IJ SUSP
INTRAMUSCULAR | Status: DC | PRN
  Administered 2024-01-13: 10 mL

## 2024-01-13 MED ORDER — DOXYCYCLINE HYCLATE 100 MG PO CAPS: 100.0000 mg | ORAL_CAPSULE | Freq: Two times a day (BID) | ORAL | 0 refills | Status: DC

## 2024-01-13 MED ORDER — ORAL CARE MOUTH RINSE: 15.0000 mL | Freq: Once | OROMUCOSAL | Status: AC

## 2024-01-13 MED ORDER — DROPERIDOL 2.5 MG/ML IJ SOLN
INTRAMUSCULAR | Status: AC
  Filled 2024-01-13: qty 2

## 2024-01-13 SURGICAL SUPPLY — 34 items
BLADE SURG SZ10 CARB STEEL (BLADE) ×2 IMPLANT
BNDG COHESIVE 4X5 TAN STRL LF (GAUZE/BANDAGES/DRESSINGS) IMPLANT
BNDG ELASTIC 4INX 5YD STR LF (GAUZE/BANDAGES/DRESSINGS) ×2 IMPLANT
BNDG ESMARCH 4X12 STRL LF (GAUZE/BANDAGES/DRESSINGS) ×2 IMPLANT
CHLORAPREP W/TINT 26 (MISCELLANEOUS) ×2 IMPLANT
CUFF TOURN SGL QUICK 18X4 (TOURNIQUET CUFF) IMPLANT
CUFF TRNQT CYL 24X4X16.5-23 (TOURNIQUET CUFF) IMPLANT
ELECT REM PT RETURN 9FT ADLT (ELECTROSURGICAL) ×2 IMPLANT
ELECTRODE REM PT RTRN 9FT ADLT (ELECTROSURGICAL) ×2 IMPLANT
GLOVE BIO SURGEON STRL SZ8 (GLOVE) ×2 IMPLANT
GLOVE INDICATOR 8.0 STRL GRN (GLOVE) ×2 IMPLANT
GLOVE SURG ORTHO 8.5 STRL (GLOVE) ×2 IMPLANT
GOWN STRL REUS W/ TWL LRG LVL3 (GOWN DISPOSABLE) ×2 IMPLANT
GOWN STRL REUS W/ TWL XL LVL3 (GOWN DISPOSABLE) ×2 IMPLANT
IMMBOLIZER KNEE 19 BLUE UNIV (SOFTGOODS) IMPLANT
KIT PREVENA INCISION MGT 13 (CANNISTER) IMPLANT
KIT TURNOVER KIT A (KITS) ×2 IMPLANT
LABEL OR SOLS (LABEL) ×2 IMPLANT
MANIFOLD NEPTUNE II (INSTRUMENTS) ×2 IMPLANT
NDL SAFETY ECLIPSE 18X1.5 (NEEDLE) ×2 IMPLANT
NS IRRIG 1000ML POUR BTL (IV SOLUTION) ×2 IMPLANT
PACK EXTREMITY ARMC (MISCELLANEOUS) ×2 IMPLANT
PAD CAST 4YDX4 CTTN HI CHSV (CAST SUPPLIES) ×2 IMPLANT
PADDING CAST BLEND 4X4 STRL (MISCELLANEOUS) ×2 IMPLANT
SPLINT CAST 1 STEP 3X12 (MISCELLANEOUS) ×2 IMPLANT
SPONGE T-LAP 18X18 ~~LOC~~+RFID (SPONGE) ×2 IMPLANT
STAPLER SKIN PROX 35W (STAPLE) ×2 IMPLANT
STOCKINETTE BIAS CUT 4 980044 (GAUZE/BANDAGES/DRESSINGS) ×2 IMPLANT
STOCKINETTE IMPERVIOUS 9X36 MD (GAUZE/BANDAGES/DRESSINGS) ×2 IMPLANT
SUT PROLENE 4 0 PS 2 18 (SUTURE) ×4 IMPLANT
SWAB CULTURE AMIES ANAERIB BLU (MISCELLANEOUS) IMPLANT
SYR 10ML LL (SYRINGE) ×2 IMPLANT
TRAP FLUID SMOKE EVACUATOR (MISCELLANEOUS) ×2 IMPLANT
WATER STERILE IRR 500ML POUR (IV SOLUTION) ×2 IMPLANT

## 2024-01-13 NOTE — Op Note (Signed)
 01/13/2024  10:36 AM  Patient:   Tamara [name]  Pre-Op Diagnosis:   Superficial wound dehiscence status post right total knee replacement.  Post-Op Diagnosis:   Same  Procedure:   Irrigation and debridement with delayed primary closure of right knee wound.  Surgeon:   Maryagnes Amos, MD  Assistant:   None  Anesthesia:   General LMA  Findings:   As above.  Complications:   None  Fluids:   400 cc crystalloid  EBL:   3 cc  UOP:   None  TT:   28 minutes at 300 mmHg  Drains:   Prevena x 1  Closure:   #0 Prolene interrupted sutures  Brief Clinical Note:   The patient is a 78 year old female who is now 6 weeks status post a right total knee arthroplasty.  The postoperative course has been notable for an area of scabbing over the midportion of the wound in the area of the patella.  Despite local care, this area has broken down, creating a focal wound dehiscence with superficial wound infection.  The patient presents at this time for formal irrigation and debridement with delayed primary closure of the wound.  Procedure:   The patient was brought into the operating room and laid in the supine position.  After adequate general laryngeal mask anesthesia was obtained, the patient's right knee wound was cultured before preoperative antibiotics were started.  The knee was prepped with a ChloraPrep solution before being draped sterilely.  A timeout was performed to verify the appropriate surgical site before the limb was exsanguinated with an Esmarch and tourniquet inflated to 300 mmHg.  The knee was noted to be lacking about 15 degrees of extension so an attempt was made to manipulate the knee into extension.  The knee was able to be extended to within 7 to 8 degrees of full extension.  The margins of the area of wound dehiscence were debrided sharply with a 15 blade.  The incision was extended proximally and distally for approximate 1.5 cm in each direction in order to better mobilize the  tissues.  The deeper tissues also were debrided sharply using a #15 blade.  These tissues also were sent for culture and sensitivity.  The wound was copiously irrigated with sterile saline solution before the skin was reapproximated using #0 Prolene interrupted sutures.  A Prevena wound VAC was applied over the incision before the patient was placed into a knee immobilizer maintaining the knee in extension.  The patient was then awakened, extubated, and returned to the recovery room in satisfactory condition after tolerating the procedure well.

## 2024-01-13 NOTE — Anesthesia Preprocedure Evaluation (Signed)
 Anesthesia Evaluation  Patient identified by MRN, date of birth, ID band Patient awake    Reviewed: Allergy & Precautions, NPO status , Patient's Chart, lab work & pertinent test results  History of Anesthesia Complications (+) DIFFICULT AIRWAY and history of anesthetic complications  Airway Mallampati: III  TM Distance: <3 FB Neck ROM: full    Dental  (+) Chipped   Pulmonary neg shortness of breath, sleep apnea , COPD, Current Smoker   Pulmonary exam normal        Cardiovascular (-) angina + CAD  Normal cardiovascular exam     Neuro/Psych  Headaches PSYCHIATRIC DISORDERS       Neuromuscular disease CVA    GI/Hepatic Neg liver ROS, hiatal hernia,GERD  Controlled,,  Endo/Other  negative endocrine ROS    Renal/GU      Musculoskeletal   Abdominal   Peds  Hematology negative hematology ROS (+)   Anesthesia Other Findings Past Medical History: 05/2021: Acute deep vein thrombosis (DVT) of left lower extremity  (HCC) 01/2007: Acute deep vein thrombosis (DVT) of right lower extremity  (HCC)     Comment:  a.) occurred postoperatively; s/p IVC filter placement 04/24/2018: Acute deep vein thrombosis (DVT) of right lower extremity  (HCC) 12/13/2014: Amaurosis fugax     Comment:   Brain MRA suggested vertebral artery partial occlusion               75 to 90% stenosed. Will treat for prevention of embolic               stroke,  Refer to [facility] for CTA and               stent and refer to cardiology for ECHO  No date: Anxiety     Comment:  a.) on BZO (clonazepam) PRN No date: Aortic atherosclerosis (HCC) No date: Arthritis No date: B12 deficiency 03/08/2014: Carpal tunnel syndrome No date: Cataracts, bilateral No date: Chronic migraine without aura, with intractable migraine, so  stated, with status migrainosus 05/24/2021: Clostridium difficile infection No date: COPD (chronic obstructive pulmonary  disease) (HCC) No date: Crohn's disease (HCC) No date: Depression 12/06/2021: Dog bite 04/24/2018: DVT (deep venous thrombosis) (HCC) 12/04/2017: Dysphagia 07/11/2018: Facial twitching No date: GERD (gastroesophageal reflux disease) No date: Hematochezia 06/14/1997: History of cardiac catheterization     Comment:  a.) LHC 06/14/1997 at [facility]: EF 60%; normal coronaries;               no obstructive CAD. No date: History of hiatal hernia No date: History of kidney stones No date: Hx of splenectomy     Comment:  a.) reports spleen was "ruptured" during routine               colonoscopy in [place] -- necessitated surgical resection 05/20/2019: Intractable chronic migraine without aura and without  status migrainosus No date: Leukocytosis No date: Moderate tricuspid insufficiency 12/2016: MRSA colonization No date: Osteoporosis No date: Pneumonia 01/24/2018: Reflux esophagitis No date: Small bowel obstruction (HCC)     Comment:  a.) 2001, 2004 (reversal of jejunal bypass). b.) s/p               laparotomy and lysis of adhesions 01/2007 2001: Small bowel obstruction (HCC) No date: Spinal stenosis of lumbar region No date: Stroke [facility]) No date: Vitamin D deficiency  Past Surgical History: No date: ANAL SPHINCTER PROSTHESIS PLACEMENT No date: APPENDECTOMY No date: BACK SURGERY     Comment:  spinal fusion 06/14/1997:  CARDIAC CATHETERIZATION; Left     Comment:  Procedure: CARDIAC CATHETERIZATION; Location: [facility];               Surgeon: Lieutenant Diego, MD 04/22/2014: CARPAL TUNNEL RELEASE; Right No date: CARPAL TUNNEL RELEASE; Left No date: CATARACT EXTRACTION; Bilateral 2005: CERVICAL FUSION     Comment:  C6-7, anterior approach No date: CHOLECYSTECTOMY No date: COLONOSCOPY     Comment:  2005, 2007, 2010, 2015, 2020, 2021 05/10/2019: COLONOSCOPY WITH PROPOFOL; N/A     Comment:  Procedure: COLONOSCOPY WITH PROPOFOL;  Surgeon: Scot Jun, MD;  Location: [facility]  ENDOSCOPY;  Service:               Endoscopy;  Laterality: N/A; 02/11/2018: ESOPHAGEAL MANOMETRY; N/A     Comment:  Procedure: ESOPHAGEAL MANOMETRY (EM);  Surgeon: Midge Minium, MD;  Location: [facility] ENDOSCOPY;  Service:               Endoscopy;  Laterality: N/A; No date: ESOPHAGOGASTRODUODENOSCOPY     Comment:  1996, 2006, 2008, 2010, 2014, 2018, 2021 12/17/2017: ESOPHAGOGASTRODUODENOSCOPY (EGD) WITH PROPOFOL; N/A     Comment:  Procedure: ESOPHAGOGASTRODUODENOSCOPY (EGD) WITH               PROPOFOL;  Surgeon: Earline Mayotte, MD;  Location:               [facility] ENDOSCOPY;  Service: Endoscopy;  Laterality: N/A; 01/08/2018: ESOPHAGOGASTRODUODENOSCOPY (EGD) WITH PROPOFOL; N/A     Comment:  Procedure: ESOPHAGOGASTRODUODENOSCOPY (EGD) WITH               PROPOFOL;  Surgeon: Earline Mayotte, MD;  Location:               [facility] ENDOSCOPY;  Service: Endoscopy;  Laterality: N/A; 09/18/2021: EXCISION NEUROMA; Left     Comment:  Procedure: Excision of traumatic neuroma infrapatellar               branch saphenous nerve left knee.;  Surgeon: Christena Flake, MD;  Location: [facility] ORS;  Service: Orthopedics;                Laterality: Left; No date: GIVENS CAPSULE STUDY     Comment:  2005, 2006 No date: INSERTION OF VENA CAVA FILTER; Right 06/22/2020: KNEE ARTHROSCOPY WITH MEDIAL MENISECTOMY; Left     Comment:  Procedure: Left knee arthroscopic partial medial               meniscectomy;  Surgeon: Signa Kell, MD;  Location:               [facility];  Service: Orthopedics;  Laterality:               Left; 05/25/2021: KNEE ARTHROSCOPY WITH MEDIAL MENISECTOMY; Left     Comment:  Procedure: Left knee arthroscopy, infrapatellar fat pad               excision;  Surgeon: Signa Kell, MD;  Location: [facility]               ORS;  Service: Orthopedics;  Laterality: Left; 01/30/2007: LAPAROTOMY     Comment:  for bowel obstruction with LOA 12/25/2016: LUMBAR  LAMINECTOMY/DECOMPRESSION MICRODISCECTOMY; N/A     Comment:  Procedure:  LUMBAR DECOMPRESSION L4-5;  Surgeon: Venetia Night, MD;  Location: [facility] ORS;  Service:               Neurosurgery;  Laterality: N/A; No date: NISSEN FUNDOPLICATION No date: ROTATOR CUFF REPAIR; Bilateral 03/04/2023: SHOULDER ARTHROSCOPY WITH SUBACROMIAL DECOMPRESSION,  ROTATOR CUFF REPAIR AND BICEP TENDON REPAIR; Left     Comment:  Procedure: LEFT SHOULDER ARTHROSCOPY WITH DEBRIDEMENT,               DECOMPRESSION, ROATOR CUFF REPAIR, AND POSSIBLE BICEPS               TENODESIS. - RNFA;  Surgeon: Christena Flake, MD;                Location: [facility] ORS;  Service: Orthopedics;  Laterality:               Left; No date: SPLENECTOMY     Comment:  secondary to colonoscopy No date: TONSILLECTOMY AND ADENOIDECTOMY 12/02/2023: TOTAL KNEE ARTHROPLASTY; Right     Comment:  Procedure: TOTAL KNEE ARTHROPLASTY;  Surgeon: Christena Flake, MD;  Location: [facility] ORS;  Service: Orthopedics;                Laterality: Right; 04/01/2022: TRANSFORAMINAL LUMBAR INTERBODY FUSION (TLIF) WITH  PEDICLE SCREW FIXATION 1 LEVEL; N/A     Comment:  Procedure: OPEN L4-5 TRANSFORAMINAL LUMBAR INTERBODY               FUSION (TLIF);  Surgeon: Venetia Night, MD;                Location: [facility] ORS;  Service: Neurosurgery;  Laterality:               N/A; 08/25/2015: TRIGGER FINGER RELEASE; Left 09/18/2015: TRIGGER FINGER RELEASE; Right No date: VAGINAL HYSTERECTOMY     Reproductive/Obstetrics negative OB ROS                             Anesthesia Physical Anesthesia Plan  ASA: 3  Anesthesia Plan: General LMA   Post-op Pain Management:    Induction: Intravenous  PONV Risk Score and Plan: Dexamethasone, Ondansetron, Midazolam and Treatment may vary due to age or medical condition  Airway Management Planned: LMA  Additional Equipment:   Intra-op Plan:   Post-operative Plan:  Extubation in OR  Informed Consent: I have reviewed the patients History and Physical, chart, labs and discussed the procedure including the risks, benefits and alternatives for the proposed anesthesia with the patient or authorized representative who has indicated his/her understanding and acceptance.     Dental Advisory Given  Plan Discussed with: Anesthesiologist, CRNA and Surgeon  Anesthesia Plan Comments: (Patient consented for risks of anesthesia including but not limited to:  - adverse reactions to medications - damage to eyes, teeth, lips or other oral mucosa - nerve damage due to positioning  - sore throat or hoarseness - Damage to heart, brain, nerves, lungs, other parts of body or loss of life  Patient voiced understanding and assent.)       Anesthesia Quick Evaluation

## 2024-01-13 NOTE — Anesthesia Postprocedure Evaluation (Signed)
 Anesthesia Post Note  Patient: EMBERLY TOMASSO  Procedure(s) Performed: IRRIGATION AND DEBRIDEMENT WITH DELAYED PRIMARY CLOSURE OF RIGHT KNEE WOUND. (Right: Knee) APPLICATION, WOUND VAC  Patient location during evaluation: PACU Anesthesia Type: General Level of consciousness: awake and alert Pain management: pain level controlled Vital Signs Assessment: post-procedure vital signs reviewed and stable Respiratory status: spontaneous breathing, nonlabored ventilation, respiratory function stable and patient connected to nasal cannula oxygen Cardiovascular status: blood pressure returned to baseline and stable Postop Assessment: no apparent nausea or vomiting Anesthetic complications: no   No notable events documented.   Last Vitals:  Vitals:   01/13/24 1100 01/13/24 1115  BP: 129/66 134/62  Pulse: 91 88  Resp: 13 15  Temp: 36.4 C 36.8 C  SpO2: 100% 100%    Last Pain:  Vitals:   01/13/24 1115  TempSrc: Temporal  PainSc: 0-No pain                 Cleda Mccreedy Shanieka Blea

## 2024-01-13 NOTE — Transfer of Care (Signed)
 Immediate Anesthesia Transfer of Care Note  Patient: Tamara [name]  Procedure(s) Performed: IRRIGATION AND DEBRIDEMENT WITH DELAYED PRIMARY CLOSURE OF RIGHT KNEE WOUND. (Right: Knee) APPLICATION, WOUND VAC  Patient Location: PACU  Anesthesia Type:General  Level of Consciousness: awake, drowsy, and patient cooperative  Airway & Oxygen Therapy: Patient Spontanous Breathing and Patient connected to face mask oxygen  Post-op Assessment: Report given to RN and Post -op Vital signs reviewed and stable  Post vital signs: Reviewed and stable  Last Vitals:  Vitals Value Taken Time  BP 139/68 01/13/24 1036  Temp 37 C 01/13/24 1036  Pulse 97 01/13/24 1044  Resp 11 01/13/24 1044  SpO2 100 % 01/13/24 1044  Vitals shown include unfiled device data.  Last Pain:  Vitals:   01/13/24 1036  TempSrc:   PainSc: 8       Patients Stated Pain Goal: 0 (01/13/24 1036)  Complications: No notable events documented.

## 2024-01-13 NOTE — H&P (Signed)
 History of Present Illness:  Tamara [name] is a 78 y.o. female who presents for follow-up now 6 weeks status post a right total knee arthroplasty. Overall, the patient feels that she is doing reasonably well. She still notes moderate pain in her knee which she rates as high as 6/10, and for which she has been taking only Tylenol as necessary with temporary partial relief of her symptoms. She had been attending physical therapy, but this was discontinued 2 weeks ago due to an area of superficial wound dehiscence along the mid incision over her patella. The patient continues to note moderate discomfort in this area and notes that this area has not been healing despite her daily dressing changes. She has completed a course of oral antibiotics and was using bacitracin, all with no significant benefit. She denies any reinjury to the knee, and denies any fevers or chills. She admits that she continues to smoke regularly.  Current Outpatient Medications:  albuterol MDI, PROVENTIL, VENTOLIN, PROAIR, HFA 90 mcg/actuation inhaler Inhale 2 inhalations into the lungs every 4 (four) hours as needed for Wheezing or Shortness of Breath  bacitracin zinc-polymyxin B (POLYSPORIN) ointment Apply topically once daily 28 g 0  clonazePAM (KLONOPIN) 0.5 MG tablet Take 0.5 mg by mouth at bedtime as needed for Anxiety  cyanocobalamin (VITAMIN B12) 1,000 mcg/mL injection Inject into the muscle monthly  dicyclomine (BENTYL) 10 mg capsule Take 1 capsule (10 mg total) by mouth 3 (three) times daily as needed 90 capsule 1  HYDROcodone-acetaminophen (NORCO) 5-325 mg tablet Take 1 tablet by mouth every 6 (six) hours as needed for Pain 30 tablet 0  nortriptyline (PAMELOR) 25 MG capsule Take 25 mg by mouth nightly  nortriptyline (PAMELOR) 25 MG capsule Take 1 capsule by mouth at bedtime  omeprazole (PRILOSEC) 40 MG DR capsule Take 40 mg by mouth 2 (two) times daily before meals  ondansetron (ZOFRAN-ODT) 4 MG disintegrating tablet  Take 1 tablet (4 mg total) by mouth 4 (four) times daily as needed for Nausea 30 tablet 2  rosuvastatin (CRESTOR) 10 MG tablet Take 10 mg by mouth once daily  sertraline (ZOLOFT) 100 MG tablet Take 100 mg by mouth once daily  traZODone (DESYREL) 100 MG tablet Take 100 mg by mouth at bedtime  traZODone (DESYREL) 100 MG tablet Take 1 tablet by mouth at bedtime   Allergies:  Eliquis [Apixaban] Nausea and Dizziness  Meloxicam Itching  Sulfa (Sulfonamide Antibiotics) Rash   Past Medical History:  Abdominal pain 10/18/2014  Last Assessment & Plan: Secondary to IBD and adhesions.  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 Refills given Lab Results Component Value Date VITAMINB12 263 01/26/2014  Anxiety state 05/06/2021  Last Assessment & Plan: Formatting of this note might be different from the original. secondary to son's recent AMI. She has been taking sertraline for years, 50 mg dose along with clonazepam for insomnia. Advised to increase zoloft to 100 mg daily  Aortic atherosclerosis (CMS-HCC) 07/19/2020  Last Assessment & Plan: Formatting of this note might be different from the original. Reviewed findings of prior CT scan today.. Patient is willing to Initiate statin therpay starting with 10 mg crestor once daily Last Assessment & Plan: Formatting of this note might be different from the original. Reviewed findings of prior CT scan today.. Patient is tolerating statin therapy with 10 mg cre  B12 deficiency 01/26/2014  Last Assessment & Plan: Formatting of this note is different from the original.  Advised to increase oral intake to daily. Repeat level needed Lab Results Component Value Date VITAMINB12 290 04/15/2016 Last Assessment & Plan: Formatting of this note is different from the original. Recurrent due to patient nonadherence to monthly injection schedule. She has crohn's of the small intestine an  Carpal tunnel syndrome  03/08/2014  Last Assessment & Plan: S/p CT release sugery by Micheal Minz. Last month. Doing well post operatively  Cataracts, bilateral  Chest pain 05/11/2013  Last Assessment & Plan: Recurrent, Atypical. Multiple CRFs. EKG and stat cardiac enzymes were negative for ischemic changes. Refer to [facility] for further evaluation  Chronic migraine without aura 09/10/2014  Last Assessment & Plan: Workup for vascular cause done after last visit, Advised her to increase pamelor to 25 mg DAILY.  Cough 03/08/2014  Last Assessment & Plan: Lungs clear but given long term cough in smoker, will get CXR today.  Crohn's disease (CMS/HHS-HCC)  diagnosed 2005  Crohn's disease in remission (CMS/HHS-HCC) 04/28/2019  DVT (deep venous thrombosis) (CMS/HHS-HCC) 04/24/2018  Emphysema with chronic bronchitis (CMS/HHS-HCC) 01/28/2020  Last Assessment & Plan: Formatting of this note might be different from the original. She is asymptomatic currently and have been prescribed maintenance doses of Spiriva and Symbicort  Encounter for preventive health examination 05/11/2013  Last Assessment & Plan: Sh eis at high risk for stroke and CAD . wellbutrin prescribed today after long discussio nof pros and cons of pharmacotherapy. Last Assessment & Plan: Formatting of this note is different from the original. She declines mammograms due to pain of procedure. She [place] up to date on other screenings. age appropriate education and counseling updated, referrals for preventat  Extremity pain 03/17/2014  GERD (gastroesophageal reflux disease)  Gouty arthropathy 10/08/2014  Headache 03/17/2014  Hematochezia 09/07/2020  History of bone density study 06/01/2009  History of bone density study 03/24/2012  History of Clostridium difficile infection 05/24/2021  History of hepatic disease 10/18/2014  Overview: s/p laparotomy/ LOA 3/08, prior reversal of jejunal bypasss 2004  History of tobacco abuse 01/26/2014  Last Assessment & Plan: Formatting of this note  might be different from the original. She stopped smoking Oct 2021 using Wellbutrin  Intractable chronic migraine without aura and without status migrainosus 05/20/2019  Low vitamin D level 05/01/2019  Malaise 03/08/2014  Last Assessment & Plan: With several other long term complaints. We made an appointment with her PCP, Dr. Darrick Huntsman for tomorrow to discuss these concerns.  Malnutrition (CMS/HHS-HCC) 02/20/2018  Last Assessment & Plan: Formatting of this note might be different from the original. I have reviewed her diet and recommended that she increase her protein and fat intake while monitoring her carbohydrates.  Moderate tricuspid insufficiency 12/15/2014  Numbness 03/17/2014  Orthostatic hypotension 12/13/2014  Last Assessment & Plan: Symptomatic ,reviewed list of meds and not taking a diuretic. Her Am cortisol was normal today, Advised to increase hydration and stop prn use of spironolactone, whci she has NOT been taking, . Will consider referral to cardiology for POTS if persistent.  OSA (obstructive sleep apnea) 08/01/2019  Formatting of this note might be different from the original. Mild by home 2 night study Done in June 2020. autotitrating CPAP ordered Sept 21 2020 Last Assessment & Plan: Formatting of this note might be different from the original. Diagnosed by prior sleep study. Patient is using CPAP every night a minimum of 6 hours per night and notes improved daytime wakefulness and decreased fatigue  Osteoporosis  Pain in both upper extremities 08/24/2015  Personal history of other diseases  of the digestive system 02/20/2015  Overview: s/p laparotomy/ LOA 3/08, prior reversal of jejunal bypasss 2004  Personal history of venous thrombosis and embolism 01/10/2007  Overview: post operative right leg, s/p vena cava filter  Pharyngoesophageal dysphagia 09/07/2020  Primary osteoarthritis of left knee 06/24/2017  Reflux esophagitis 01/24/2018  Last Assessment & Plan: Formatting of this note might be  different from the original. Managed with omeprazole 20 mg daily  Regional enteritis (CMS/HHS-HCC) 10/18/2014  Last Assessment & Plan: Managed by Dr. Mechele Collin with mesalamine  Right lower quadrant abdominal pain 01/30/2021  S/P carpal tunnel release 05/18/2014  SBO (small bowel obstruction) (CMS/HHS-HCC) 2001 and 2004  Spinal stenosis, lumbar region, with neurogenic claudication 11/08/2016  Last Assessment & Plan: Formatting of this note might be different from the original. Confirmed by MRI with bilateral L4 nervie root impingement as well. Recommending neurosurgical referral.  Status post carpal tunnel release 04/29/2014  Steroid-induced osteoporosis 11/19/2011  Last Assessment & Plan: Formatting of this note might be different from the original. Given her smoking (ongoing) and history of hiatal hernia repair, Her options appear to be limited to Reclast given failure of Prolia due to cost. She sees endocrinology next week. Reviewed calcium and vitamin D needs. Last Assessment & Plan: Formatting of this note might be different from the original. Her op  Tingling 03/01/2015  Tobacco abuse 03/23/2014  Underweight 08/27/2015  Last Assessment & Plan: Formatting of this note might be different from the original. Chronic, with slight improvement following resolution of dysphagia with Nissen funduplication takedown Last Assessment & Plan: Formatting of this note might be different from the original. She refuses to regard er low body weight as pathologic and does not want to gai weight. She is not bulemic or anorexic.  Vision problems 03/17/2014  Vitamin D deficiency 08/27/2015  Last Assessment & Plan: Formatting of this note might be different from the original. Recurrent, Will continue weekly vitamin d 50,000 IUs Last Assessment & Plan: Formatting of this note is different from the original. With osteoporosis and recent fall and fracture. Continue current supplementation Last vitamin D Lab Results Component Value  Date VD25OH 43.98 01/29/2021  Weight loss, abnormal 09/07/2020   Past Surgical History:  SIGMOIDOSCOPY FLEXIBLE 05/22/1998  COLONOSCOPY 04/25/2004 (Hyperplastic Polyp)  CAPSULE ENDOSCOPY 05/02/2004  FUSION OF C6-7 ANTERIOR CERVICAL APPROACH 10/30/2004  CAPSULE ENDOSCOPY 08/28/2005  Right Carpal Tunnel Release 04/22/2014  Right long finger trigger release 09/18/2015  BACK SURGERY 12/25/2016 (L4-L5 decompression)  EGD 07/22/2017 (No repeat per RTE)  COLONOSCOPY 05/10/2019 (PH Crohn's; Adenomatous Polyp: CBF 04/2024)  Left knee arthroscopy, partial medial menisectomy, chondroplasty of patellofemoral and medial compartments Left 06/22/2020 (Dr. Allena Katz)  COLONOSCOPY 09/26/2020 (Negative colon biopsies/PHx Crohn's/Repeat 81yrs/TKT)  EGD 09/26/2020 (Normal EGD biopsy/PHx GERD/REpeat 46yrs/TKT)  Left knee arthroscopy, partial synovectomy with infrapatellar fat pad debridement, chondroplasty of patellofemoral and medial compartments Left 05/25/2021 (Dr. Allena Katz)  Excision and reimplantation of traumatic neuroma of infrapatellar branch of saphenous nerve, repair of medial retinacular defect, left knee Left 09/18/2021 (Dr. Joice Lofts)  Right L4-5 far lateral discectomy 11/06/2021 (Dr Myer Haff at [facility])  L4-5 transforaminal lumbar interbody fusion 04/01/2022 (Dr. Myer Haff at [facility] [place])  Extensive arthroscopic debridement, arthroscopic subacromial decompression, and mini-open rotator cuff repair, left shoulder Left 03/04/2023 (Dr. Joice Lofts)  ANAL SPHINCTER PROSTHESIS PLACEMENT  APPENDECTOMY  CHOLECYSTECTOMY OPEN  COLONOSCOPY 03/21/2014, 02/01/2009, 01/29/2006 (PH Crohn's Disease: CBF 03/2019)  EGD 09/01/2013, 02/07/2009, 01/05/2007, 05/06/2005, 03/17/1995 (No repeat per RTE)  HYSTERECTOMY VAGINAL  Left Long Finger Trigger Release 08/25/15 (Dr. Rosita Kea)  Spleen removal (patient had a colonoscopy in [place] and they ruptured her spleen)  TONSILLECTOMY AND ADENOIDECTOMY   Family History:  Myocardial Infarction  (Heart attack) Mother  Diabetes Mother  Osteoporosis (Thinning of bones) Mother  Rheum arthritis Mother  Myocardial Infarction (Heart attack) Father  Myocardial Infarction (Heart attack) Sister   Social History:   Socioeconomic History:  Marital status: Widowed  Tobacco Use  Smoking status: Every Day  Current packs/day: 0.00  Average packs/day: 1 pack/day for 13.0 years (13.0 ttl pk-yrs)  Types: Cigarettes  Start date: 03/22/2009  Last attempt to quit: 03/22/2022  Years since quitting: 1.8  Smokeless tobacco: Never  Vaping Use  Vaping status: Never Used  Substance and Sexual Activity  Alcohol use: No  Alcohol/week: 0.0 standard drinks of alcohol  Drug use: Never  Sexual activity: Not Currently   Social Drivers of Health:   Physicist, medical Strain: Low Risk (02/21/2022)  Received from [facility] [place]  Overall Financial Resource Strain (CARDIA)  Difficulty of Paying Living Expenses: Not hard at all  Food Insecurity: No Food Insecurity (02/21/2022)  Received from [facility], [place]  Hunger Vital Sign  Worried About Running Out of Food in the Last Year: Never true  Ran Out of Food in the Last Year: Never true  Transportation Needs: No Transportation Needs (02/21/2022)  Received from [facility],   PRAPARE - Transportation  Lack of Transportation (Medical): No  Lack of Transportation (Non-Medical): No   Review of Systems:  A comprehensive 14 point ROS was performed, reviewed, and the pertinent orthopaedic findings are documented in the HPI.  Physical Exam: Vitals:  01/12/24 1040 01/12/24 1041  BP: 122/60  Weight: (!) 38.6 kg (85 lb 3.2 oz)  Height: 160 cm (5\' 3" )  PainSc: 6 6  PainLoc: Knee Knee   General/Constitutional: The patient appears to be well-nourished, well-developed, and in no acute distress. Neuro/Psych: Normal mood and affect, oriented to person, place and time. Eyes: Non-icteric. Pupils are equal, round, and reactive to light,  and exhibit synchronous movement. ENT: Unremarkable. Lymphatic: No palpable adenopathy. Respiratory: Lungs clear to auscultation, Normal chest excursion, No wheezes, and Non-labored breathing Cardiovascular: Regular rate and rhythm. No murmurs. and No edema, swelling or tenderness, except as noted in detailed exam. Integumentary: No impressive skin lesions present, except as noted in detailed exam. Musculoskeletal: Unremarkable, except as noted in detailed exam.  Right knee exam: The patient ambulates with a moderate limp, favoring her right leg, but is not using any assistive devices. Skin inspection of the right knee demonstrates an area of approximately 1.5 x 2.5 cm of superficial wound dehiscence over the prepatellar region along the mid portion of her incision. There is a scant yellowish discharge from the area but no surrounding erythema is noted. She has mild tenderness diffusely around the knee, but there is no knee effusion. She is able to range her knee from 5 to 90 degrees with mild pain anteriorly. She is neurovascularly intact to the right lower extremity and foot.  X-rays/MRI/Lab data:  Standing AP and lateral x-rays of the right knee, as well as a sunrise view, are obtained. These films demonstrate excellent position of the femoral, tibial, patellar components which are without evidence of loosening. On the sunrise view, her patella tracks centrally within the femoral trochlea. No new acute bony abnormalities are identified.  Assessment: 1. Superficial wound dehiscence right knee. 2. Primary osteoarthritis of right knee.  3. Status post total knee replacement using cement, right.  Plan: The treatment options were discussed with the patient. In addition, patient educational materials were provided regarding the diagnosis and treatment options. The patient is quite frustrated by her symptoms and function limitations, especially as they pertain to the area of wound dehiscence.  Therefore, I have recommended a surgical procedure, specifically a formal irrigation debridement of the wound with delayed primary closure. The procedure was discussed with the patient, as were the potential risks (including bleeding, infection, nerve and/or blood vessel injury, persistent or recurrent pain, recurrent wound dehiscence, loosening of and/or failure of the components, need for further surgery, blood clots, strokes, heart attacks and/or arhythmias, pneumonia, etc.) and benefits. The patient states her understanding and wishes to proceed. All of the patient's questions and concerns were answered. She can call any time with further concerns. She will follow up post-surgery, routine.    H&P reviewed and patient re-examined. No changes.

## 2024-01-13 NOTE — Discharge Instructions (Addendum)
 Orthopedic discharge instructions: Keep Prevena dressing dry and intact.  May sponge bath with intact OpSite/Prevena dressing. Apply ice frequently to knee or use Polar Care device. Take ibuprofen 600 mg TID with meals for 3-5 days, then as necessary. Take pain medication as prescribed or ES Tylenol when needed.  Take oral antibiotics twice daily as prescribed. May weight-bear as tolerated in knee immobilizer - use crutches or walker as needed. Follow-up in 10-14 days or as scheduled.

## 2024-01-14 ENCOUNTER — Encounter: Payer: Self-pay | Admitting: Surgery

## 2024-01-18 LAB — AEROBIC/ANAEROBIC CULTURE W GRAM STAIN (SURGICAL/DEEP WOUND)
Culture: NO GROWTH
Gram Stain: NONE SEEN

## 2024-01-20 ENCOUNTER — Other Ambulatory Visit: Payer: Self-pay | Admitting: Internal Medicine

## 2024-02-11 DIAGNOSIS — M25561 Pain in right knee: Secondary | ICD-10-CM | POA: Diagnosis not present

## 2024-02-11 DIAGNOSIS — Z96651 Presence of right artificial knee joint: Secondary | ICD-10-CM | POA: Diagnosis not present

## 2024-02-13 DIAGNOSIS — Z96651 Presence of right artificial knee joint: Secondary | ICD-10-CM | POA: Diagnosis not present

## 2024-02-13 DIAGNOSIS — M25561 Pain in right knee: Secondary | ICD-10-CM | POA: Diagnosis not present

## 2024-02-16 DIAGNOSIS — M25561 Pain in right knee: Secondary | ICD-10-CM | POA: Diagnosis not present

## 2024-02-16 DIAGNOSIS — Z96651 Presence of right artificial knee joint: Secondary | ICD-10-CM | POA: Diagnosis not present

## 2024-02-20 DIAGNOSIS — M25561 Pain in right knee: Secondary | ICD-10-CM | POA: Diagnosis not present

## 2024-02-20 DIAGNOSIS — Z96651 Presence of right artificial knee joint: Secondary | ICD-10-CM | POA: Diagnosis not present

## 2024-02-23 DIAGNOSIS — Z96651 Presence of right artificial knee joint: Secondary | ICD-10-CM | POA: Diagnosis not present

## 2024-02-23 DIAGNOSIS — M25561 Pain in right knee: Secondary | ICD-10-CM | POA: Diagnosis not present

## 2024-03-02 DIAGNOSIS — Z96651 Presence of right artificial knee joint: Secondary | ICD-10-CM | POA: Diagnosis not present

## 2024-03-02 DIAGNOSIS — M25561 Pain in right knee: Secondary | ICD-10-CM | POA: Diagnosis not present

## 2024-03-05 DIAGNOSIS — M25561 Pain in right knee: Secondary | ICD-10-CM | POA: Diagnosis not present

## 2024-03-05 DIAGNOSIS — Z96651 Presence of right artificial knee joint: Secondary | ICD-10-CM | POA: Diagnosis not present

## 2024-03-16 ENCOUNTER — Ambulatory Visit (INDEPENDENT_AMBULATORY_CARE_PROVIDER_SITE_OTHER)

## 2024-03-16 ENCOUNTER — Other Ambulatory Visit: Payer: Self-pay

## 2024-03-16 ENCOUNTER — Telehealth: Payer: Self-pay

## 2024-03-16 VITALS — BP 118/62 | HR 89 | Temp 97.8°F | Wt 84.8 lb

## 2024-03-16 DIAGNOSIS — R059 Cough, unspecified: Secondary | ICD-10-CM | POA: Diagnosis not present

## 2024-03-16 DIAGNOSIS — R051 Acute cough: Secondary | ICD-10-CM

## 2024-03-16 DIAGNOSIS — J449 Chronic obstructive pulmonary disease, unspecified: Secondary | ICD-10-CM | POA: Diagnosis not present

## 2024-03-16 DIAGNOSIS — J301 Allergic rhinitis due to pollen: Secondary | ICD-10-CM | POA: Diagnosis not present

## 2024-03-16 DIAGNOSIS — R079 Chest pain, unspecified: Secondary | ICD-10-CM | POA: Diagnosis not present

## 2024-03-16 LAB — CBC WITH DIFFERENTIAL/PLATELET
Basophils Absolute: 0.1 10*3/uL (ref 0.0–0.1)
Basophils Relative: 0.7 % (ref 0.0–3.0)
Eosinophils Absolute: 0.1 10*3/uL (ref 0.0–0.7)
Eosinophils Relative: 1.5 % (ref 0.0–5.0)
HCT: 38.6 % (ref 36.0–46.0)
Hemoglobin: 12.8 g/dL (ref 12.0–15.0)
Lymphocytes Relative: 49.7 % — ABNORMAL HIGH (ref 12.0–46.0)
Lymphs Abs: 4.1 10*3/uL — ABNORMAL HIGH (ref 0.7–4.0)
MCHC: 33.1 g/dL (ref 30.0–36.0)
MCV: 105 fl — ABNORMAL HIGH (ref 78.0–100.0)
Monocytes Absolute: 0.9 10*3/uL (ref 0.1–1.0)
Monocytes Relative: 10.3 % (ref 3.0–12.0)
Neutro Abs: 3.1 10*3/uL (ref 1.4–7.7)
Neutrophils Relative %: 37.8 % — ABNORMAL LOW (ref 43.0–77.0)
Platelets: 263 10*3/uL (ref 150.0–400.0)
RBC: 3.68 Mil/uL — ABNORMAL LOW (ref 3.87–5.11)
RDW: 12.6 % (ref 11.5–15.5)
WBC: 8.3 10*3/uL (ref 4.0–10.5)

## 2024-03-16 MED ORDER — FLUTICASONE PROPIONATE 50 MCG/ACT NA SUSP: 2.0000 | Freq: Every day | NASAL | 2 refills | Status: DC

## 2024-03-16 MED ORDER — UMECLIDINIUM BROMIDE 62.5 MCG/ACT IN AEPB: 1.0000 | INHALATION_SPRAY | Freq: Every day | RESPIRATORY_TRACT | 2 refills | Status: DC

## 2024-03-16 MED ORDER — SPIRIVA RESPIMAT 2.5 MCG/ACT IN AERS: 2.0000 | INHALATION_SPRAY | Freq: Every day | RESPIRATORY_TRACT | 3 refills | Status: DC

## 2024-03-16 MED ORDER — GUAIFENESIN ER 600 MG PO TB12: 600.0000 mg | ORAL_TABLET | Freq: Two times a day (BID) | ORAL | 0 refills | Status: AC

## 2024-03-16 NOTE — Progress Notes (Addendum)
 Acute Office Visit  Subjective:    Patient ID: Tamara [name], female    DOB: 01-22-1946, 78 y.o.   MRN: [id number]  Chief Complaint  Patient presents with   Lung Concern    Patient has been having issues with her L lower back area. Patient is concerned it may be her lung giving her issues. Patient say she has a constant cough if she doesn't take any medication. Patient says she has been having the issue for the past two weeks. Patient says she has also noticed some swelling in her eyes as well.     Cough This is a new problem. Episode onset: 2 weeks. The problem has been waxing and waning. Episode frequency: coughing all day. The cough is Non-productive. Associated symptoms include nasal congestion, rhinorrhea and weight loss (for about 5 months). Pertinent negatives include no chest pain, fever, heartburn, myalgias, rash, shortness of breath, sweats or wheezing. Associated symptoms comments: Cough is associated pain on left lower back. The symptoms are aggravated by cold air and pollens. Risk factors for lung disease include smoking/tobacco exposure (Patient smokes 1 pack of ciggerate per day, for about 50 plus years). Treatments tried: Dayquill which helps a little. Her past medical history is significant for COPD and environmental allergies (pollen related).  Patient reports she has noted lost in appetite since January. She has noted weight loss since then as well. She continues to smoke 1 pack of cigarettes daily. She is not on any medication for seasonal allergy. Patient reports she also noted bright red blood in stool for the last 4 days. She has medical h/o crohn's disease, h/o c dif, GERD, chronic abdominal pain. She sees [facility] (Dr. Corky Diener), last appointment was on 10/02/23, she is supposed to f/u with them in 6 months.   Patient is in today for following acute concern:  Had right knee arthroplasty in 12/2023 followed by superficial wound dehiscence treated with Doxycycline .     Review of Systems  Constitutional:  Positive for weight loss (for about 5 months). Negative for fever.  HENT:  Positive for rhinorrhea.   Respiratory:  Positive for cough. Negative for shortness of breath and wheezing.   Cardiovascular:  Negative for chest pain.  Gastrointestinal:  Negative for heartburn.  Musculoskeletal:  Negative for myalgias.  Skin:  Negative for rash.  Endo/Heme/Allergies:  Positive for environmental allergies (pollen related).   As per HPI    Objective:    BP 118/62   Pulse 89   Temp 97.8 F (36.6 C) (Oral)   Wt 84 lb 12.8 oz (38.5 kg)   SpO2 98%   BMI 15.02 kg/m    Physical Exam Constitutional:      Appearance: She is underweight.  HENT:     Head: Normocephalic and atraumatic.     Right Ear: Tympanic membrane normal.     Left Ear: Tympanic membrane normal.     Nose: Rhinorrhea present.     Mouth/Throat:     Mouth: Mucous membranes are moist.  Neck:     Thyroid : No thyroid  mass or thyroid  tenderness.  Cardiovascular:     Rate and Rhythm: Normal rate and regular rhythm.  Pulmonary:     Effort: Pulmonary effort is normal.     Breath sounds: Wheezing (basilar wheezes b/l) present.  Abdominal:     General: Bowel sounds are normal.     Palpations: Abdomen is soft.     Tenderness: There is no guarding or rebound.  Musculoskeletal:  Cervical back: Neck supple. No rigidity.     Right lower leg: No edema.     Left lower leg: No edema.  Skin:    General: Skin is warm.  Neurological:     Mental Status: She is alert and oriented to person, place, and time.  Psychiatric:        Mood and Affect: Mood normal.        Behavior: Behavior normal. Behavior is cooperative.     No results found for any visits on 03/16/24.     Assessment & Plan:  Pleasant 78 year old female presenting to the clinic for evaluation of non-productive cough a/w left lower back pain. D/D includes COPD, pneumonia, msk pain from cough, postnasal drip.   Acute  cough Assessment & Plan: - Recommend chest x-ray. Guaifenesin  600 mg BID for 7 days for cough. Stop other OTC cough medications.  - Tiotropium Bromide Monohydrate  2.5 mcg/act, inhale 2 puffs into the lungs daily.  - Check CBC.  - If continues to have cough recommend PFT.  - Smoking cessation counseling provided. Patient is currently not interested in quitting smoking.   Orders: -     DG Chest 2 View; Future -     guaiFENesin  ER; Take 1 tablet (600 mg total) by mouth 2 (two) times daily for 7 days.  Dispense: 14 tablet; Refill: 0 -     CBC with Differential/Platelet  Seasonal allergic rhinitis due to pollen Assessment & Plan: - Start nasal Flonase , 2 puffs daily. Prescription sent to the pharmacy.  Orders: -     Fluticasone  Propionate; Place 2 sprays into both nostrils daily.  Dispense: 16 g; Refill: 2  Chronic obstructive pulmonary disease, unspecified COPD type (HCC) Assessment & Plan: Plan as per acute cough.   Orders: -     Spiriva  Respimat; Inhale 2 puffs into the lungs daily.  Dispense: 4 g; Refill: 3  Patient has h/o Crohn's disease. She is due for a 6 M f/u with her GI. I discussed the d/d for blood in stool including Crohn's flare up, hemorrhoids, colon cancer. She is recommended to f/u with GI with in a week. Patient verbalizes understanding and states she will call her GI doctor to schedule urgent appointment.  Return in about 2 weeks (around 03/30/2024) for With Dr. Tullo for chronic follow up. I spent 45 minutes on the day of this face to face encounter reviewing patient's most recent visit with GI, ortho, prior relevant surgical and non surgical procedures, smoking history, h/o cough and treatment for this in the past, recent  labs and imaging studies, reviewing the assessment and plan with patient, and post visit ordering and reviewing of  diagnostics and therapeutics with patient .    Jacklin Mascot, MD ---------------------------------- Addendum:  Patient updated on  x-ray result. Patient also reported that her insurance does not cover for Spiriva . Reached out to [facility] out patient pharmacist who reported patient's insurance covers for [employer] with copay of $89.16. Will change therapy to Incruse Ellipta, 62.5 micrograms once daily. Prescription sent to the pharmacy.   Jacklin Mascot, MD

## 2024-03-16 NOTE — Assessment & Plan Note (Signed)
-   Start nasal Flonase , 2 puffs daily. Prescription sent to the pharmacy.

## 2024-03-16 NOTE — Addendum Note (Signed)
 Addended by: Dayjah Selman on: 03/16/2024 12:14 PM   Modules accepted: Orders

## 2024-03-16 NOTE — Progress Notes (Signed)
 Please let the patient know the chest x-ray was negative for pneumonia. Continue treatment as discussed during her office visit.   Jacklin Mascot, MD

## 2024-03-16 NOTE — Patient Instructions (Addendum)
--   We are getting a chest x-ray today. I will reach out to you with results and see if we need to add antibiotic to the current treatment.  -- For COPD, cough, I recommend we start you on Spiriva , 2 puffs daily. I also recommend we stop smoking at least for the time when you have this cough.  -- I also recommend starting nasal Flonase , 2 puffs in each nostril daily to help with seasonal allergy.  -- For cough I also recommend we try Mucinex  600 mg, twice a day for 7 days.  -- Please schedule an appointment with Dr. Corky Diener for GI concern within 1 week.  -- Please schedule a follow up appointment with Dr. Madelon Scheuermann in 2 weeks.

## 2024-03-16 NOTE — Assessment & Plan Note (Addendum)
 Plan as per acute cough.  --------------- Addendum: After patient left her appointment:  insurance does not cover for Spiriva . Reached out to [facility] out patient pharmacist who reported patient's insurance covers for [employer] with copay of $89.16. Will change therapy to Incruse Ellipta, 62.5 micrograms once daily. Prescription sent to the pharmacy.

## 2024-03-16 NOTE — Telephone Encounter (Signed)
 Pharmacy Patient Advocate Encounter   Received notification from CoverMyMeds that prior authorization for Spiriva  Respimat 2.5MCG/ACT aerosol is required/requested.   Insurance verification completed.   The patient is insured through [facility] .   Per test claim: PA required; PA started via CoverMyMeds. KEY [id number] . Waiting for clinical questions to populate.

## 2024-03-16 NOTE — Assessment & Plan Note (Addendum)
-   Recommend chest x-ray. Guaifenesin  600 mg BID for 7 days for cough. Stop other OTC cough medications.  - Tiotropium Bromide Monohydrate  2.5 mcg/act, inhale 2 puffs into the lungs daily.  - Check CBC.  - If continues to have cough recommend PFT.  - Smoking cessation counseling provided. Patient is currently not interested in quitting smoking.

## 2024-03-17 ENCOUNTER — Other Ambulatory Visit (HOSPITAL_COMMUNITY): Payer: Self-pay

## 2024-03-17 NOTE — Telephone Encounter (Signed)
 Pharmacy Patient Advocate Encounter  Received notification from [employer] that Prior Authorization for Spiriva  Respimat 2.5MCG/ACT aerosol has been CANCELLED due to CHANGE THERAPY TO [facility] PREFERRED DRUG     PA #/Case ID/Reference #: [id number]

## 2024-03-25 ENCOUNTER — Other Ambulatory Visit: Payer: Self-pay | Admitting: Internal Medicine

## 2024-03-30 ENCOUNTER — Ambulatory Visit (INDEPENDENT_AMBULATORY_CARE_PROVIDER_SITE_OTHER): Admitting: Internal Medicine

## 2024-03-30 ENCOUNTER — Encounter: Payer: Self-pay | Admitting: Internal Medicine

## 2024-03-30 ENCOUNTER — Encounter (INDEPENDENT_AMBULATORY_CARE_PROVIDER_SITE_OTHER): Payer: Self-pay

## 2024-03-30 VITALS — BP 106/54 | HR 88 | Ht 63.0 in | Wt 83.4 lb

## 2024-03-30 DIAGNOSIS — E538 Deficiency of other specified B group vitamins: Secondary | ICD-10-CM | POA: Diagnosis not present

## 2024-03-30 DIAGNOSIS — M818 Other osteoporosis without current pathological fracture: Secondary | ICD-10-CM | POA: Diagnosis not present

## 2024-03-30 DIAGNOSIS — R197 Diarrhea, unspecified: Secondary | ICD-10-CM | POA: Diagnosis not present

## 2024-03-30 DIAGNOSIS — R051 Acute cough: Secondary | ICD-10-CM | POA: Diagnosis not present

## 2024-03-30 DIAGNOSIS — T380X5A Adverse effect of glucocorticoids and synthetic analogues, initial encounter: Secondary | ICD-10-CM | POA: Diagnosis not present

## 2024-03-30 DIAGNOSIS — R634 Abnormal weight loss: Secondary | ICD-10-CM

## 2024-03-30 DIAGNOSIS — J432 Centrilobular emphysema: Secondary | ICD-10-CM | POA: Diagnosis not present

## 2024-03-30 MED ORDER — DOXYCYCLINE HYCLATE 100 MG PO TABS: 100.0000 mg | ORAL_TABLET | Freq: Two times a day (BID) | ORAL | 0 refills | Status: DC

## 2024-03-30 MED ORDER — ROSUVASTATIN CALCIUM 10 MG PO TABS: 10.0000 mg | ORAL_TABLET | Freq: Every day | ORAL | 1 refills | Status: DC

## 2024-03-30 NOTE — Assessment & Plan Note (Signed)
 Secondary to Crohn's of the small intestine. She has been advised to continue self administering monthly injections

## 2024-03-30 NOTE — Assessment & Plan Note (Signed)
 Treating for bronchitis given persistent symptoms,  normal chest x ray . Doxycycline   prescribed.

## 2024-03-30 NOTE — Assessment & Plan Note (Addendum)
 Managed by to [facility] Endocrinology , last Reclast  infusion in Feb 2024  without side effects.  Not taking any calcium  supplements.  Taking vitamin D . Prescribed weekly by [facility].    BMD is increasing by 2022 DEXA ,but he is due for repeat DEXa

## 2024-03-30 NOTE — Patient Instructions (Addendum)
 Continue flonase  and consider adding  allegra   at bedtime FOR THE ALLERGIES   Doxycycline   twice daily for 14 days  TAKE WITH A FULL MEAL TO AVOID NAUSEA   RETURN FOR BLOOD WORK WHEN YOU ARE ABLE

## 2024-03-30 NOTE — Assessment & Plan Note (Signed)
 Continue  Incruse Ellipta  daily.  .  Strongly urged to quit smoking

## 2024-03-30 NOTE — Progress Notes (Signed)
 Subjective:  Patient ID: Tamara [name], female    DOB: [birth year]  Age: 78 y.o. MRN: [id number]  CC: The primary encounter diagnosis was Unintentional weight loss. Diagnoses of Bloody diarrhea, Acute cough, B12 deficiency, Centrilobular emphysema (HCC), and Steroid-induced osteoporosis were also pertinent to this visit.   HPI Tamara [name] presents for  Chief Complaint  Patient presents with   Medical Management of Chronic Issues   1) COPD:   prescribed Incruse .Ellipa  once daily 2 weeks ago.  Chest x ray no acute finding, but  still having productive cough, sinus drainage  is yellow . Still smoking  1 pack daily .Aaron Aas Reports no dyspne with activity       2) hematochezia:  she has postponed her appt with [facility]  because of $$$ . . Bleeding has stopped   3) weight loss: has lost 10 lbs in the past year.  baseline weight  is currently  80-81 lbs  despite 3 meals daily  and a good appetite.  Last colonosopy was done in  2021 . Last thyroid  check  was > 1 year ago.    Lab Results  Component Value Date   TSH 1.32 09/12/2022      Outpatient Medications Prior to Visit  Medication Sig Dispense Refill   acetaminophen  (TYLENOL ) 500 MG tablet Take 500-1,000 mg by mouth every 6 (six) hours as needed (pain.).     clonazePAM  (KLONOPIN ) 0.5 MG tablet Take 1 tablet (0.5 mg total) by mouth at bedtime.     cyanocobalamin  (VITAMIN B12) 1000 MCG/ML injection USE 1 ML INTO MUSCLE EVERY 30 DAY AS DIRECTED 10 mL 2   fluticasone  (FLONASE ) 50 MCG/ACT nasal spray Place 2 sprays into both nostrils daily. 16 g 2   nortriptyline  (PAMELOR ) 25 MG capsule TAKE 1 CAPSULE BY MOUTH AT BEDTIME 90 capsule 1   omeprazole (PRILOSEC) 40 MG capsule TAKE 1 CAPSULE BY MOUTH TWICE DAILY 180 capsule 1   ondansetron  (ZOFRAN -ODT) 4 MG disintegrating tablet Take 1 tablet (4 mg total) by mouth every 8 (eight) hours as needed for nausea or vomiting. 20 tablet 1   sertraline  (ZOLOFT ) 100 MG tablet Take 1 tablet (100 mg  total) by mouth at bedtime.     traZODone  (DESYREL ) 100 MG tablet TAKE 1 TABLET BY MOUTH AT BEDTIME 90 tablet 0   umeclidinium bromide  (INCRUSE ELLIPTA ) 62.5 MCG/ACT AEPB Inhale 1 puff into the lungs daily. 30 each 2   rosuvastatin  (CRESTOR ) 10 MG tablet TAKE 1 TABLET BY MOUTH AT BEDTIME 90 tablet 1   No facility-administered medications prior to visit.    Review of Systems;  Patient denies headache, fevers, malaise, , skin rash, eye pain, sinus congestion and sinus pain, sore throat, dysphagia,  hemoptysis ,, dyspnea, wheezing, chest pain, palpitations, orthopnea, edema, abdominal pain, nausea, melena, diarrhea, constipation, flank pain, dysuria, hematuria, urinary  Frequency, nocturia, numbness, tingling, seizures,  Focal weakness, Loss of consciousness,  Tremor, insomnia, depression, anxiety, and suicidal ideation.      Objective:  BP (!) 106/54   Pulse 88   Ht 5\' 3"  (1.6 m)   Wt 83 lb 6.4 oz (37.8 kg)   SpO2 98%   BMI 14.77 kg/m   BP Readings from Last 3 Encounters:  03/30/24 (!) 106/54  03/16/24 118/62  01/13/24 134/62    Wt Readings from Last 3 Encounters:  03/30/24 83 lb 6.4 oz (37.8 kg)  03/16/24 84 lb 12.8 oz (38.5 kg)  12/02/23 87 lb 11.9 oz (39.8  kg)    Physical Exam Vitals reviewed.  Constitutional:      General: She is not in acute distress.    Appearance: She is cachectic. She is not ill-appearing, toxic-appearing or diaphoretic.  HENT:     Head: Normocephalic.     Right Ear: Tympanic membrane, ear canal and external ear normal. There is no impacted cerumen.     Left Ear: Tympanic membrane, ear canal and external ear normal. There is no impacted cerumen.     Nose: Nose normal.     Mouth/Throat:     Mouth: Mucous membranes are moist.     Pharynx: Oropharynx is clear.  Eyes:     General: No scleral icterus.       Right eye: No discharge.        Left eye: No discharge.     Conjunctiva/sclera: Conjunctivae normal.     Pupils: Pupils are equal, round, and  reactive to light.  Neck:     Thyroid : No thyromegaly.     Vascular: No carotid bruit or JVD.  Cardiovascular:     Rate and Rhythm: Normal rate and regular rhythm.     Heart sounds: Normal heart sounds.  Pulmonary:     Effort: Pulmonary effort is normal. No respiratory distress.     Breath sounds: Normal breath sounds.  Chest:  Breasts:    Breasts are symmetrical.     Right: Normal. No swelling, inverted nipple, mass, nipple discharge, skin change or tenderness.     Left: Normal. No swelling, inverted nipple, mass, nipple discharge, skin change or tenderness.  Abdominal:     General: Bowel sounds are normal.     Palpations: Abdomen is soft. There is no mass.     Tenderness: There is no abdominal tenderness. There is no guarding or rebound.  Musculoskeletal:        General: Normal range of motion.     Cervical back: Normal range of motion and neck supple.  Lymphadenopathy:     Cervical: No cervical adenopathy.     Upper Body:     Right upper body: No supraclavicular, axillary or pectoral adenopathy.     Left upper body: No supraclavicular, axillary or pectoral adenopathy.  Skin:    General: Skin is warm and dry.  Neurological:     General: No focal deficit present.     Mental Status: She is alert and oriented to person, place, and time. Mental status is at baseline.  Psychiatric:        Mood and Affect: Mood normal.        Behavior: Behavior normal.        Thought Content: Thought content normal.        Judgment: Judgment normal.    Lab Results  Component Value Date   HGBA1C 6.1 06/03/2023   HGBA1C 6.3 09/12/2022   HGBA1C 6.1 09/21/2020    Lab Results  Component Value Date   CREATININE 0.70 11/27/2023   CREATININE 0.75 09/22/2023   CREATININE 0.94 06/03/2023    Lab Results  Component Value Date   WBC 8.3 03/16/2024   HGB 12.8 03/16/2024   HCT 38.6 03/16/2024   PLT 263.0 03/16/2024   GLUCOSE 62 (L) 11/27/2023   CHOL 128 06/03/2023   TRIG 174 (H) 06/03/2023    HDL 72 06/03/2023   LDLDIRECT 43.0 09/12/2022   LDLCALC 31 06/03/2023   ALT 33 11/27/2023   AST 32 11/27/2023   NA 139 11/27/2023   K 3.5 11/27/2023  CL 110 11/27/2023   CREATININE 0.70 11/27/2023   BUN 10 11/27/2023   CO2 21 (L) 11/27/2023   TSH 1.32 09/12/2022   INR 1.0 04/19/2022   HGBA1C 6.1 06/03/2023    No results found.  Assessment & Plan:  .Unintentional weight loss -     TSH; Future -     Comprehensive metabolic panel with GFR; Future  Bloody diarrhea -     Sedimentation rate; Future -     C-reactive protein; Future  Acute cough Assessment & Plan: Treating for bronchitis given persistent symptoms,  normal chest x ray . Doxycycline   prescribed.    B12 deficiency Assessment & Plan: Secondary to Crohn's of the small intestine. She has been advised to continue self administering monthly injections     Centrilobular emphysema (HCC) Assessment & Plan: Continue  Incruse Ellipta  daily.  .  Strongly urged to quit smoking    Steroid-induced osteoporosis Assessment & Plan: Managed by to [facility] End[facility]inology , last Reclast  infusion in Feb 2024  without side effects.  Not taking any calcium  supplements.  Taking vitamin D . Prescribed weekly by [facility].    BMD is increasing by 2022 DEXA ,but he is due for repeat DEXa    Other orders -     Rosuvastatin  Calcium ; Take 1 tablet (10 mg total) by mouth at bedtime.  Dispense: 90 tablet; Refill: 1 -     Doxycycline  Hyclate; Take 1 tablet (100 mg total) by mouth 2 (two) times daily.  Dispense: 14 tablet; Refill: 0     I spent 34 minutes on the day of this face to face encounter reviewing patient's  most recent visit with Endocrinology, GI,   prior relevant surgical and non surgical procedures, recent  labs and imaging studies, counseling on weight management,  reviewing the assessment and plan with patient, and post visit ordering and reviewing of  diagnostics and therapeutics with patient  .   Follow-up: Return in about  6 months (around 09/30/2024).   Thersia Flax, MD

## 2024-04-01 ENCOUNTER — Other Ambulatory Visit (INDEPENDENT_AMBULATORY_CARE_PROVIDER_SITE_OTHER)

## 2024-04-01 DIAGNOSIS — R634 Abnormal weight loss: Secondary | ICD-10-CM

## 2024-04-01 DIAGNOSIS — R197 Diarrhea, unspecified: Secondary | ICD-10-CM | POA: Diagnosis not present

## 2024-04-01 LAB — COMPREHENSIVE METABOLIC PANEL WITH GFR
ALT: 23 U/L (ref 0–35)
AST: 21 U/L (ref 0–37)
Albumin: 4.2 g/dL (ref 3.5–5.2)
Alkaline Phosphatase: 54 U/L (ref 39–117)
BUN: 13 mg/dL (ref 6–23)
CO2: 23 meq/L (ref 19–32)
Calcium: 8.7 mg/dL (ref 8.4–10.5)
Chloride: 112 meq/L (ref 96–112)
Creatinine, Ser: 0.6 mg/dL (ref 0.40–1.20)
GFR: 87.2 mL/min (ref >60.00)
Glucose, Bld: 98 mg/dL (ref 70–99)
Potassium: 3.6 meq/L (ref 3.5–5.1)
Sodium: 140 meq/L (ref 135–145)
Total Bilirubin: 0.4 mg/dL (ref 0.2–1.2)
Total Protein: 6 g/dL (ref 6.0–8.3)

## 2024-04-01 LAB — SEDIMENTATION RATE: Sed Rate: <1 mm/h (ref 0–30)

## 2024-04-01 LAB — TSH: TSH: 2.45 u[IU]/mL (ref 0.35–5.50)

## 2024-04-01 LAB — C-REACTIVE PROTEIN: CRP: <1 mg/dL (ref 0.5–20.0)

## 2024-04-03 ENCOUNTER — Ambulatory Visit: Payer: Self-pay | Admitting: Internal Medicine

## 2024-04-30 ENCOUNTER — Other Ambulatory Visit: Payer: Self-pay | Admitting: Internal Medicine

## 2024-05-03 NOTE — Telephone Encounter (Signed)
 Refilled: 01/13/2024 Last OV: 03/30/2024 Next OV: not scheduled

## 2024-05-10 DIAGNOSIS — G8929 Other chronic pain: Secondary | ICD-10-CM | POA: Diagnosis not present

## 2024-05-10 DIAGNOSIS — M25562 Pain in left knee: Secondary | ICD-10-CM | POA: Diagnosis not present

## 2024-05-10 DIAGNOSIS — M1712 Unilateral primary osteoarthritis, left knee: Secondary | ICD-10-CM | POA: Diagnosis not present

## 2024-05-17 DIAGNOSIS — M1712 Unilateral primary osteoarthritis, left knee: Secondary | ICD-10-CM | POA: Diagnosis not present

## 2024-05-17 DIAGNOSIS — M1612 Unilateral primary osteoarthritis, left hip: Secondary | ICD-10-CM | POA: Diagnosis not present

## 2024-05-17 DIAGNOSIS — G8929 Other chronic pain: Secondary | ICD-10-CM | POA: Diagnosis not present

## 2024-05-24 DIAGNOSIS — M25562 Pain in left knee: Secondary | ICD-10-CM | POA: Diagnosis not present

## 2024-05-24 DIAGNOSIS — M1712 Unilateral primary osteoarthritis, left knee: Secondary | ICD-10-CM | POA: Diagnosis not present

## 2024-05-24 DIAGNOSIS — G8929 Other chronic pain: Secondary | ICD-10-CM | POA: Diagnosis not present

## 2024-06-14 DIAGNOSIS — M810 Age-related osteoporosis without current pathological fracture: Secondary | ICD-10-CM | POA: Diagnosis not present

## 2024-06-14 DIAGNOSIS — I2510 Atherosclerotic heart disease of native coronary artery without angina pectoris: Secondary | ICD-10-CM | POA: Diagnosis not present

## 2024-06-14 DIAGNOSIS — G709 Myoneural disorder, unspecified: Secondary | ICD-10-CM | POA: Diagnosis not present

## 2024-06-14 DIAGNOSIS — K5090 Crohn's disease, unspecified, without complications: Secondary | ICD-10-CM | POA: Diagnosis not present

## 2024-06-14 DIAGNOSIS — F325 Major depressive disorder, single episode, in full remission: Secondary | ICD-10-CM | POA: Diagnosis not present

## 2024-06-14 DIAGNOSIS — Z7982 Long term (current) use of aspirin: Secondary | ICD-10-CM | POA: Diagnosis not present

## 2024-06-14 DIAGNOSIS — G4733 Obstructive sleep apnea (adult) (pediatric): Secondary | ICD-10-CM | POA: Diagnosis not present

## 2024-06-14 DIAGNOSIS — J4489 Other specified chronic obstructive pulmonary disease: Secondary | ICD-10-CM | POA: Diagnosis not present

## 2024-06-14 DIAGNOSIS — I700 Atherosclerosis of aorta: Secondary | ICD-10-CM | POA: Diagnosis not present

## 2024-06-14 DIAGNOSIS — M1990 Unspecified osteoarthritis, unspecified site: Secondary | ICD-10-CM | POA: Diagnosis not present

## 2024-06-14 DIAGNOSIS — E46 Unspecified protein-calorie malnutrition: Secondary | ICD-10-CM | POA: Diagnosis not present

## 2024-06-14 DIAGNOSIS — E785 Hyperlipidemia, unspecified: Secondary | ICD-10-CM | POA: Diagnosis not present

## 2024-06-15 ENCOUNTER — Other Ambulatory Visit: Payer: Self-pay | Admitting: Internal Medicine

## 2024-06-24 ENCOUNTER — Other Ambulatory Visit: Payer: Self-pay | Admitting: Surgery

## 2024-06-25 ENCOUNTER — Other Ambulatory Visit: Payer: Self-pay | Admitting: Internal Medicine

## 2024-06-29 DIAGNOSIS — M25562 Pain in left knee: Secondary | ICD-10-CM | POA: Diagnosis not present

## 2024-06-29 DIAGNOSIS — M1712 Unilateral primary osteoarthritis, left knee: Secondary | ICD-10-CM | POA: Diagnosis not present

## 2024-06-29 DIAGNOSIS — G8929 Other chronic pain: Secondary | ICD-10-CM | POA: Diagnosis not present

## 2024-07-01 ENCOUNTER — Other Ambulatory Visit: Payer: Self-pay

## 2024-07-01 ENCOUNTER — Encounter
Admission: RE | Admit: 2024-07-01 | Discharge: 2024-07-01 | Disposition: A | Discharge status: Home / Self Care | Source: Ambulatory Visit | Attending: Surgery | Admitting: Surgery

## 2024-07-01 DIAGNOSIS — Z01818 Encounter for other preprocedural examination: Secondary | ICD-10-CM | POA: Diagnosis present

## 2024-07-01 DIAGNOSIS — Z01812 Encounter for preprocedural laboratory examination: Secondary | ICD-10-CM

## 2024-07-01 HISTORY — DX: Unilateral primary osteoarthritis, left knee: M17.12

## 2024-07-01 HISTORY — DX: Transient cerebral ischemic attack, unspecified: G45.9

## 2024-07-01 LAB — COMPREHENSIVE METABOLIC PANEL WITH GFR
ALT: 30 U/L (ref 0–44)
AST: 29 U/L (ref 15–41)
Albumin: 4.1 g/dL (ref 3.5–5.0)
Alkaline Phosphatase: 52 U/L (ref 38–126)
Anion gap: 11 (ref 5–15)
BUN: 18 mg/dL (ref 8–23)
CO2: 22 mmol/L (ref 22–32)
Calcium: 9.4 mg/dL (ref 8.9–10.3)
Chloride: 106 mmol/L (ref 98–111)
Creatinine, Ser: 0.76 mg/dL (ref 0.44–1.00)
GFR, Estimated: >60 mL/min (ref >60)
Glucose, Bld: 110 mg/dL — ABNORMAL HIGH (ref 70–99)
Potassium: 3.5 mmol/L (ref 3.5–5.1)
Sodium: 139 mmol/L (ref 135–145)
Total Bilirubin: 0.6 mg/dL (ref 0.0–1.2)
Total Protein: 6.7 g/dL (ref 6.5–8.1)

## 2024-07-01 LAB — URINALYSIS, ROUTINE W REFLEX MICROSCOPIC
Bilirubin Urine: NEGATIVE
Glucose, UA: NEGATIVE mg/dL
Hgb urine dipstick: NEGATIVE
Ketones, ur: NEGATIVE mg/dL
Leukocytes,Ua: NEGATIVE
Nitrite: NEGATIVE
Protein, ur: NEGATIVE mg/dL
Specific Gravity, Urine: 1.017 (ref 1.005–1.030)
pH: 5 (ref 5.0–8.0)

## 2024-07-01 LAB — CBC WITH DIFFERENTIAL/PLATELET
Abs Immature Granulocytes: 0.02 K/uL (ref 0.00–0.07)
Basophils Absolute: 0.1 K/uL (ref 0.0–0.1)
Basophils Relative: 1 %
Eosinophils Absolute: 0.1 K/uL (ref 0.0–0.5)
Eosinophils Relative: 1 %
HCT: 41.4 % (ref 36.0–46.0)
Hemoglobin: 13.5 g/dL (ref 12.0–15.0)
Immature Granulocytes: 0 %
Lymphocytes Relative: 43 %
Lymphs Abs: 4.7 K/uL — ABNORMAL HIGH (ref 0.7–4.0)
MCH: 34.4 pg — ABNORMAL HIGH (ref 26.0–34.0)
MCHC: 32.6 g/dL (ref 30.0–36.0)
MCV: 105.6 fL — ABNORMAL HIGH (ref 80.0–100.0)
Monocytes Absolute: 1.2 K/uL — ABNORMAL HIGH (ref 0.1–1.0)
Monocytes Relative: 11 %
Neutro Abs: 4.8 K/uL (ref 1.7–7.7)
Neutrophils Relative %: 44 %
Platelets: 259 K/uL (ref 150–400)
RBC: 3.92 MIL/uL (ref 3.87–5.11)
RDW: 13.7 % (ref 11.5–15.5)
WBC: 10.9 K/uL — ABNORMAL HIGH (ref 4.0–10.5)
nRBC: 0.2 % (ref 0.0–0.2)

## 2024-07-01 LAB — SURGICAL PCR SCREEN
MRSA, PCR: NEGATIVE
Staphylococcus aureus: NEGATIVE

## 2024-07-01 NOTE — Patient Instructions (Signed)
 Your procedure is scheduled on: Thursday, August 28 Report to the Registration Desk on the 1st floor of the [facility] To find out your arrival time, please call [phone] between 1PM - 3PM on: Wednesday, August 27 If your arrival time is 6:00 am, do not arrive before that time as the [facility] entrance doors do not open until 6:00 am.  REMEMBER: Instructions that are not followed completely may result in serious medical risk, up to and including death; or upon the discretion of your surgeon and anesthesiologist your surgery may need to be rescheduled.  Do not eat food after midnight the night before surgery.  No gum chewing or hard candies.  You may however, drink CLEAR liquids up to 2 hours before you are scheduled to arrive for your surgery. Do not drink anything within 2 hours of your scheduled arrival time.  Clear liquids include: - water  - apple juice without pulp - gatorade (not RED colors) - black coffee or tea (Do NOT add milk or creamers to the coffee or tea) Do NOT drink anything that is not on this list.  In addition, your doctor has ordered for you to drink the provided:  Ensure Pre-Surgery Clear Carbohydrate Drink  Drinking this carbohydrate drink up to two hours before surgery helps to reduce insulin resistance and improve patient outcomes. Please complete drinking 2 hours before scheduled arrival time.  One week prior to surgery: starting August 21 Stop Anti-inflammatories (NSAIDS) such as Advil , Aleve, Ibuprofen , Motrin , Naproxen, Naprosyn and Aspirin  based products such as Excedrin, Goody's Powder, BC Powder. Stop ANY OVER THE COUNTER supplements until after surgery. Stop vitamins.  You may however, continue to take Tylenol  if needed for pain up until the day of surgery.  Continue taking all of your other prescription medications up until the day of surgery.  ON THE DAY OF SURGERY ONLY TAKE THESE MEDICATIONS WITH SIPS OF WATER:  omeprazole (PRILOSEC)    No Alcohol for 24 hours before or after surgery.  No Smoking including e-cigarettes for 24 hours before surgery.  No chewable tobacco products for at least 6 hours before surgery.  No nicotine  patches on the day of surgery.  Do not use any recreational drugs for at least a week (preferably 2 weeks) before your surgery.  Please be advised that the combination of cocaine and anesthesia may have negative outcomes, up to and including death. If you test positive for cocaine, your surgery will be cancelled.  On the morning of surgery brush your teeth with toothpaste and water, you may rinse your mouth with mouthwash if you wish. Do not swallow any toothpaste or mouthwash.  Use CHG Soap as directed on instruction sheet.  Do not wear jewelry, make-up, hairpins, clips or nail polish.  For welded (permanent) jewelry: bracelets, anklets, waist bands, etc.  Please have this removed prior to surgery.  If it is not removed, there is a chance that hospital personnel will need to cut it off on the day of surgery.  Do not wear lotions, powders, or perfumes.   Do not shave body hair from the neck down 48 hours before surgery.  Contact lenses, hearing aids and dentures may not be worn into surgery.  Do not bring valuables to the hospital. [facility] is not responsible for any missing/lost belongings or valuables.   Bring your C-PAP to the hospital in case you may have to spend the night.   Notify your doctor if there is any change in your  medical condition (cold, fever, infection).  Wear comfortable clothing (specific to your surgery type) to the hospital.  After surgery, you can help prevent lung complications by doing breathing exercises.  Take deep breaths and cough every 1-2 hours. Your doctor may order a device called an Incentive Spirometer to help you take deep breaths.  If you are being admitted to the hospital overnight, leave your suitcase in the car. After surgery it may be  brought to your room.  In case of increased patient census, it may be necessary for you, the patient, to continue your postoperative care in the Same Day Surgery department.  If you are being discharged the day of surgery, you will not be allowed to drive home. You will need a responsible individual to drive you home and stay with you for 24 hours after surgery.   If you are taking public transportation, you will need to have a responsible individual with you.  Please call the Pre-admissions Testing Dept. at [phone] if you have any questions about these instructions.  Surgery Visitation Policy:  Patients having surgery or a procedure may have two visitors.  Children under the age of 36 must have an adult with them who is not the patient.  Inpatient Visitation:    Visiting hours are 7 a.m. to 8 p.m. Up to four visitors are allowed at one time in a patient room. The visitors may rotate out with other people during the day.  One visitor age 48 or older may stay with the patient overnight and must be in the room by 8 p.m.   Merchandiser, retail to address health-related social needs:  [email or web address]        Pre-operative 5 CHG Bath Instructions   You can play a key role in reducing the risk of infection after surgery. Your skin needs to be as free of germs as possible. You can reduce the number of germs on your skin by washing with CHG (chlorhexidine  gluconate) soap before surgery. CHG is an antiseptic soap that kills germs and continues to kill germs even after washing.   DO NOT use if you have an allergy to chlorhexidine /CHG or antibacterial soaps. If your skin becomes reddened or irritated, stop using the CHG and notify one of our RNs at [phone].   Please shower with the CHG soap starting 4 days before surgery using the following schedule:     Please keep in mind the following:  DO NOT shave, including legs and underarms, starting the day  of your first shower.   You may shave your face at any point before/day of surgery.  Place clean sheets on your bed the day you start using CHG soap. Use a clean washcloth (not used since being washed) for each shower. DO NOT sleep with pets once you start using the CHG.   CHG Shower Instructions:  If you choose to wash your hair and private area, wash first with your normal shampoo/soap.  After you use shampoo/soap, rinse your hair and body thoroughly to remove shampoo/soap residue.  Turn the water OFF and apply about 3 tablespoons (45 ml) of CHG soap to a CLEAN washcloth.  Apply CHG soap ONLY FROM YOUR NECK DOWN TO YOUR TOES (washing for 3-5 minutes)  DO NOT use CHG soap on face, private areas, open wounds, or sores.  Pay special attention to the area where your surgery is being performed.  If you are having back surgery, having someone wash your back for you may  be helpful. Wait 2 minutes after CHG soap is applied, then you may rinse off the CHG soap.  Pat dry with a clean towel  Put on clean clothes/pajamas   If you choose to wear lotion, please use ONLY the CHG-compatible lotions on the back of this paper.     Additional instructions for the day of surgery: DO NOT APPLY any lotions, deodorants, cologne, or perfumes.   Put on clean/comfortable clothes.  Brush your teeth.  Ask your nurse before applying any prescription medications to the skin.      CHG Compatible Lotions   Aveeno Moisturizing lotion  Cetaphil Moisturizing Cream  Cetaphil Moisturizing Lotion  Clairol Herbal Essence Moisturizing Lotion, Dry Skin  Clairol Herbal Essence Moisturizing Lotion, Extra Dry Skin  Clairol Herbal Essence Moisturizing Lotion, Normal Skin  Curel Age Defying Therapeutic Moisturizing Lotion with Alpha Hydroxy  Curel Extreme Care Body Lotion  Curel Soothing Hands Moisturizing Hand Lotion  Curel Therapeutic Moisturizing Cream, Fragrance-Free  Curel Therapeutic Moisturizing Lotion,  Fragrance-Free  Curel Therapeutic Moisturizing Lotion, Original Formula  Eucerin Daily Replenishing Lotion  Eucerin Dry Skin Therapy Plus Alpha Hydroxy Crme  Eucerin Dry Skin Therapy Plus Alpha Hydroxy Lotion  Eucerin Original Crme  Eucerin Original Lotion  Eucerin Plus Crme Eucerin Plus Lotion  Eucerin TriLipid Replenishing Lotion  Keri Anti-Bacterial Hand Lotion  Keri Deep Conditioning Original Lotion Dry Skin Formula Softly Scented  Keri Deep Conditioning Original Lotion, Fragrance Free Sensitive Skin Formula  Keri Lotion Fast Absorbing Fragrance Free Sensitive Skin Formula  Keri Lotion Fast Absorbing Softly Scented Dry Skin Formula  Keri Original Lotion  Keri Skin Renewal Lotion Keri Silky Smooth Lotion  Keri Silky Smooth Sensitive Skin Lotion  Nivea Body Creamy Conditioning Oil  Nivea Body Extra Enriched Lotion  Nivea Body Original Lotion  Nivea Body Sheer Moisturizing Lotion Nivea Crme  Nivea Skin Firming Lotion  NutraDerm 30 Skin Lotion  NutraDerm Skin Lotion  NutraDerm Therapeutic Skin Cream  NutraDerm Therapeutic Skin Lotion  ProShield Protective Hand Cream  Provon moisturizing lotion     Preoperative Educational Videos for Total Hip, Knee and Shoulder Replacements  To better prepare for surgery, please view our videos that explain the physical activity and discharge planning required to have the best surgical recovery at [facility].  [email or web address]  Questions? Call [phone] or email [email or web address]       How to Use an Incentive Spirometer  An incentive spirometer is a tool that measures how well you are filling your lungs with each breath. Learning to take long, deep breaths using this tool can help you keep your lungs clear and active. This may help to reverse or lessen your chance of developing breathing (pulmonary) problems, especially infection. You  may be asked to use a spirometer: After a surgery. If you have a lung problem or a history of smoking. After a long period of time when you have been unable to move or be active. If the spirometer includes an indicator to show the highest number that you have reached, your health care provider or respiratory therapist will help you set a goal. Keep a log of your progress as told by your health care provider. What are the risks? Breathing too quickly may cause dizziness or cause you to pass out. Take your time so you do not get dizzy or light-headed. If you are in pain, you may need to take pain medicine before doing incentive spirometry. It is harder to take a deep breath if you  are having pain. How to use your incentive spirometer  Sit up on the edge of your bed or on a chair. Hold the incentive spirometer so that it is in an upright position. Before you use the spirometer, breathe out normally. Place the mouthpiece in your mouth. Make sure your lips are closed tightly around it. Breathe in slowly and as deeply as you can through your mouth, causing the piston or the ball to rise toward the top of the chamber. Hold your breath for 3-5 seconds, or for as long as possible. If the spirometer includes a coach indicator, use this to guide you in breathing. Slow down your breathing if the indicator goes above the marked areas. Remove the mouthpiece from your mouth and breathe out normally. The piston or ball will return to the bottom of the chamber. Rest for a few seconds, then repeat the steps 10 or more times. Take your time and take a few normal breaths between deep breaths so that you do not get dizzy or light-headed. Do this every 1-2 hours when you are awake. If the spirometer includes a goal marker to show the highest number you have reached (best effort), use this as a goal to work toward during each repetition. After each set of 10 deep breaths, cough a few times. This will help to make  sure that your lungs are clear. If you have an incision on your chest or abdomen from surgery, place a pillow or a rolled-up towel firmly against the incision when you cough. This can help to reduce pain while taking deep breaths and coughing. General tips When you are able to get out of bed: Walk around often. Continue to take deep breaths and cough in order to clear your lungs. Keep using the incentive spirometer until your health care provider says it is okay to stop using it. If you have been in the hospital, you may be told to keep using the spirometer at home. Contact a health care provider if: You are having difficulty using the spirometer. You have trouble using the spirometer as often as instructed. Your pain medicine is not giving enough relief for you to use the spirometer as told. You have a fever. Get help right away if: You develop shortness of breath. You develop a cough with bloody mucus from the lungs. You have fluid or blood coming from an incision site after you cough. Summary An incentive spirometer is a tool that can help you learn to take long, deep breaths to keep your lungs clear and active. You may be asked to use a spirometer after a surgery, if you have a lung problem or a history of smoking, or if you have been inactive for a long period of time. Use your incentive spirometer as instructed every 1-2 hours while you are awake. If you have an incision on your chest or abdomen, place a pillow or a rolled-up towel firmly against your incision when you cough. This will help to reduce pain. Get help right away if you have shortness of breath, you cough up bloody mucus, or blood comes from your incision when you cough. This information is not intended to replace advice given to you by your health care provider. Make sure you discuss any questions you have with your health care provider. Document Revised: 01/17/2020 Document Reviewed: 01/17/2020 Elsevier Patient Education   2023 [employer].

## 2024-07-08 ENCOUNTER — Ambulatory Visit: Payer: Self-pay | Admitting: Urgent Care

## 2024-07-08 ENCOUNTER — Encounter
Admission: RE | Disposition: A | Discharge status: Home / Self Care | Payer: Self-pay | Source: Home / Self Care | Attending: Surgery

## 2024-07-08 ENCOUNTER — Ambulatory Visit

## 2024-07-08 ENCOUNTER — Other Ambulatory Visit: Payer: Self-pay

## 2024-07-08 ENCOUNTER — Encounter: Payer: Self-pay | Admitting: Surgery

## 2024-07-08 ENCOUNTER — Ambulatory Visit
Admission: RE | Admit: 2024-07-08 | Discharge: 2024-07-08 | Disposition: A | Discharge status: Home / Self Care | Attending: Surgery | Admitting: Surgery

## 2024-07-08 DIAGNOSIS — J449 Chronic obstructive pulmonary disease, unspecified: Secondary | ICD-10-CM | POA: Diagnosis not present

## 2024-07-08 DIAGNOSIS — Z5986 Financial insecurity: Secondary | ICD-10-CM | POA: Insufficient documentation

## 2024-07-08 DIAGNOSIS — I2510 Atherosclerotic heart disease of native coronary artery without angina pectoris: Secondary | ICD-10-CM | POA: Insufficient documentation

## 2024-07-08 DIAGNOSIS — Z471 Aftercare following joint replacement surgery: Secondary | ICD-10-CM | POA: Diagnosis not present

## 2024-07-08 DIAGNOSIS — K219 Gastro-esophageal reflux disease without esophagitis: Secondary | ICD-10-CM | POA: Insufficient documentation

## 2024-07-08 DIAGNOSIS — G4733 Obstructive sleep apnea (adult) (pediatric): Secondary | ICD-10-CM | POA: Insufficient documentation

## 2024-07-08 DIAGNOSIS — M1712 Unilateral primary osteoarthritis, left knee: Secondary | ICD-10-CM | POA: Insufficient documentation

## 2024-07-08 DIAGNOSIS — F17210 Nicotine dependence, cigarettes, uncomplicated: Secondary | ICD-10-CM | POA: Insufficient documentation

## 2024-07-08 DIAGNOSIS — Z8249 Family history of ischemic heart disease and other diseases of the circulatory system: Secondary | ICD-10-CM | POA: Insufficient documentation

## 2024-07-08 DIAGNOSIS — Z96652 Presence of left artificial knee joint: Secondary | ICD-10-CM | POA: Diagnosis not present

## 2024-07-08 DIAGNOSIS — Z86718 Personal history of other venous thrombosis and embolism: Secondary | ICD-10-CM | POA: Diagnosis not present

## 2024-07-08 DIAGNOSIS — Z01812 Encounter for preprocedural laboratory examination: Secondary | ICD-10-CM

## 2024-07-08 DIAGNOSIS — G709 Myoneural disorder, unspecified: Secondary | ICD-10-CM | POA: Insufficient documentation

## 2024-07-08 HISTORY — PX: TOTAL KNEE ARTHROPLASTY: SHX125

## 2024-07-08 SURGERY — ARTHROPLASTY, KNEE, TOTAL
Anesthesia: General | Site: Knee | Laterality: Left

## 2024-07-08 MED ORDER — HYDROCODONE-ACETAMINOPHEN 5-325 MG PO TABS: 1.0000 | ORAL_TABLET | Freq: Four times a day (QID) | ORAL | 0 refills | Status: DC | PRN

## 2024-07-08 MED ORDER — SODIUM CHLORIDE 0.9 % BOLUS PEDS: 250.0000 mL | Freq: Once | INTRAVENOUS | Status: DC

## 2024-07-08 MED ORDER — MIDAZOLAM HCL 2 MG/2ML IJ SOLN
INTRAMUSCULAR | Status: AC
  Filled 2024-07-08: qty 2

## 2024-07-08 MED ORDER — KETAMINE HCL 50 MG/5ML IJ SOSY
PREFILLED_SYRINGE | INTRAMUSCULAR | Status: AC
  Filled 2024-07-08: qty 5

## 2024-07-08 MED ORDER — LIDOCAINE HCL (CARDIAC) PF 100 MG/5ML IV SOSY
PREFILLED_SYRINGE | INTRAVENOUS | Status: DC | PRN
  Administered 2024-07-08: 60 mg via INTRAVENOUS

## 2024-07-08 MED ORDER — ONDANSETRON HCL 4 MG PO TABS: 4.0000 mg | ORAL_TABLET | Freq: Four times a day (QID) | ORAL | Status: DC | PRN

## 2024-07-08 MED ORDER — OXYCODONE HCL 5 MG/5ML PO SOLN: 5.0000 mg | Freq: Once | ORAL | Status: AC | PRN

## 2024-07-08 MED ORDER — CLONAZEPAM 0.5 MG PO TABS: 0.5000 mg | ORAL_TABLET | Freq: Every day | ORAL | Status: DC

## 2024-07-08 MED ORDER — LACTATED RINGERS IV SOLN: INTRAVENOUS | Status: DC

## 2024-07-08 MED ORDER — KETOROLAC TROMETHAMINE 30 MG/ML IJ SOLN
INTRAMUSCULAR | Status: DC | PRN
  Administered 2024-07-08: 30 mg via INTRAMUSCULAR

## 2024-07-08 MED ORDER — FENTANYL CITRATE (PF) 100 MCG/2ML IJ SOLN
INTRAMUSCULAR | Status: AC
  Filled 2024-07-08: qty 2

## 2024-07-08 MED ORDER — PHENYLEPHRINE HCL-NACL 20-0.9 MG/250ML-% IV SOLN
INTRAVENOUS | Status: DC | PRN
  Administered 2024-07-08: 40 ug/min via INTRAVENOUS

## 2024-07-08 MED ORDER — SODIUM CHLORIDE 0.9 % BOLUS PEDS
250.0000 mL | Freq: Once | INTRAVENOUS | Status: AC
  Administered 2024-07-08: 250 mL via INTRAVENOUS

## 2024-07-08 MED ORDER — PHENYLEPHRINE HCL-NACL 20-0.9 MG/250ML-% IV SOLN
INTRAVENOUS | Status: AC
Start: 2024-07-08 — End: 2024-07-08
  Filled 2024-07-08: qty 250

## 2024-07-08 MED ORDER — SODIUM CHLORIDE 0.9 % IV SOLN: INTRAVENOUS | Status: DC

## 2024-07-08 MED ORDER — PHENYLEPHRINE 80 MCG/ML (10ML) SYRINGE FOR IV PUSH (FOR BLOOD PRESSURE SUPPORT)
PREFILLED_SYRINGE | INTRAVENOUS | Status: AC
Start: 2024-07-08 — End: 2024-07-08
  Filled 2024-07-08: qty 10

## 2024-07-08 MED ORDER — TRANEXAMIC ACID-NACL 1000-0.7 MG/100ML-% IV SOLN
1000.0000 mg | INTRAVENOUS | Status: AC
  Administered 2024-07-08: 1000 mg via INTRAVENOUS

## 2024-07-08 MED ORDER — PROPOFOL 10 MG/ML IV BOLUS
INTRAVENOUS | Status: DC | PRN
  Administered 2024-07-08: 120 mg via INTRAVENOUS
  Administered 2024-07-08: 40 mg via INTRAVENOUS

## 2024-07-08 MED ORDER — ORAL CARE MOUTH RINSE: 15.0000 mL | Freq: Once | OROMUCOSAL | Status: AC

## 2024-07-08 MED ORDER — FENTANYL CITRATE (PF) 100 MCG/2ML IJ SOLN: 25.0000 ug | INTRAMUSCULAR | Status: DC | PRN

## 2024-07-08 MED ORDER — MIDAZOLAM HCL 2 MG/2ML IJ SOLN
INTRAMUSCULAR | Status: DC | PRN
  Administered 2024-07-08 (×2): 1 mg via INTRAVENOUS

## 2024-07-08 MED ORDER — CEFAZOLIN SODIUM-DEXTROSE 2-4 GM/100ML-% IV SOLN
2.0000 g | INTRAVENOUS | Status: AC
  Administered 2024-07-08: 2 g via INTRAVENOUS

## 2024-07-08 MED ORDER — BUPIVACAINE-EPINEPHRINE (PF) 0.5% -1:200000 IJ SOLN
INTRAMUSCULAR | Status: DC | PRN
Start: 2024-07-08 — End: 2024-07-08
  Administered 2024-07-08: 30 mL

## 2024-07-08 MED ORDER — KETAMINE HCL 50 MG/5ML IJ SOSY
PREFILLED_SYRINGE | INTRAMUSCULAR | Status: DC | PRN
  Administered 2024-07-08: 20 mg via INTRAVENOUS
  Administered 2024-07-08: 10 mg via INTRAVENOUS

## 2024-07-08 MED ORDER — METOCLOPRAMIDE HCL 10 MG PO TABS: 5.0000 mg | ORAL_TABLET | Freq: Three times a day (TID) | ORAL | Status: DC | PRN

## 2024-07-08 MED ORDER — SODIUM CHLORIDE 0.9 % IR SOLN
Status: DC | PRN
  Administered 2024-07-08: 3000 mL

## 2024-07-08 MED ORDER — DEXMEDETOMIDINE HCL IN NACL 80 MCG/20ML IV SOLN
INTRAVENOUS | Status: AC
Start: 2024-07-08 — End: 2024-07-08
  Filled 2024-07-08: qty 20

## 2024-07-08 MED ORDER — ONDANSETRON HCL 4 MG/2ML IJ SOLN
INTRAMUSCULAR | Status: DC | PRN
  Administered 2024-07-08: 4 mg via INTRAVENOUS

## 2024-07-08 MED ORDER — ACETAMINOPHEN 10 MG/ML IV SOLN
INTRAVENOUS | Status: AC
  Filled 2024-07-08: qty 100

## 2024-07-08 MED ORDER — KETOROLAC TROMETHAMINE 15 MG/ML IJ SOLN
INTRAMUSCULAR | Status: AC
  Filled 2024-07-08: qty 1

## 2024-07-08 MED ORDER — CEFAZOLIN SODIUM-DEXTROSE 2-4 GM/100ML-% IV SOLN
2.0000 g | Freq: Four times a day (QID) | INTRAVENOUS | Status: DC
  Administered 2024-07-08: 2 g via INTRAVENOUS

## 2024-07-08 MED ORDER — FENTANYL CITRATE (PF) 100 MCG/2ML IJ SOLN
INTRAMUSCULAR | Status: AC
Start: 2024-07-08 — End: 2024-07-08
  Filled 2024-07-08: qty 2

## 2024-07-08 MED ORDER — OXYCODONE HCL 5 MG PO TABS
5.0000 mg | ORAL_TABLET | Freq: Once | ORAL | Status: AC | PRN
  Administered 2024-07-08: 5 mg via ORAL

## 2024-07-08 MED ORDER — OXYCODONE HCL 5 MG PO TABS
ORAL_TABLET | ORAL | Status: AC
  Filled 2024-07-08: qty 1

## 2024-07-08 MED ORDER — SODIUM CHLORIDE 0.9 % IV SOLN
INTRAVENOUS | Status: DC | PRN
  Administered 2024-07-08: 60 mL

## 2024-07-08 MED ORDER — SODIUM CHLORIDE (PF) 0.9 % IJ SOLN
INTRAMUSCULAR | Status: AC
  Filled 2024-07-08: qty 40

## 2024-07-08 MED ORDER — DEXMEDETOMIDINE HCL IN NACL 80 MCG/20ML IV SOLN
INTRAVENOUS | Status: DC | PRN
Start: 2024-07-08 — End: 2024-07-08
  Administered 2024-07-08: 8 ug via INTRAVENOUS

## 2024-07-08 MED ORDER — CEFAZOLIN SODIUM-DEXTROSE 2-4 GM/100ML-% IV SOLN
INTRAVENOUS | Status: AC
  Filled 2024-07-08: qty 100

## 2024-07-08 MED ORDER — HYDROCODONE-ACETAMINOPHEN 5-325 MG PO TABS: 1.0000 | ORAL_TABLET | ORAL | Status: DC | PRN

## 2024-07-08 MED ORDER — ACETAMINOPHEN 10 MG/ML IV SOLN
INTRAVENOUS | Status: DC | PRN
Start: 2024-07-08 — End: 2024-07-08
  Administered 2024-07-08: 1000 mg via INTRAVENOUS

## 2024-07-08 MED ORDER — TRIAMCINOLONE ACETONIDE 40 MG/ML IJ SUSP
INTRAMUSCULAR | Status: AC
  Filled 2024-07-08: qty 2

## 2024-07-08 MED ORDER — KETOROLAC TROMETHAMINE 15 MG/ML IJ SOLN
15.0000 mg | Freq: Once | INTRAMUSCULAR | Status: AC
  Administered 2024-07-08: 15 mg via INTRAVENOUS

## 2024-07-08 MED ORDER — TRIAMCINOLONE ACETONIDE 40 MG/ML IJ SUSP
INTRAMUSCULAR | Status: DC | PRN
  Administered 2024-07-08: 80 mg via INTRAMUSCULAR

## 2024-07-08 MED ORDER — FENTANYL CITRATE (PF) 100 MCG/2ML IJ SOLN
INTRAMUSCULAR | Status: DC | PRN
  Administered 2024-07-08: 25 ug via INTRAVENOUS
  Administered 2024-07-08 (×3): 50 ug via INTRAVENOUS
  Administered 2024-07-08: 25 ug via INTRAVENOUS

## 2024-07-08 MED ORDER — METOCLOPRAMIDE HCL 5 MG/ML IJ SOLN: 5.0000 mg | Freq: Three times a day (TID) | INTRAMUSCULAR | Status: DC | PRN

## 2024-07-08 MED ORDER — ONDANSETRON HCL 4 MG/2ML IJ SOLN: 4.0000 mg | Freq: Four times a day (QID) | INTRAMUSCULAR | Status: DC | PRN

## 2024-07-08 MED ORDER — BUPIVACAINE-EPINEPHRINE (PF) 0.5% -1:200000 IJ SOLN
INTRAMUSCULAR | Status: AC
  Filled 2024-07-08: qty 30

## 2024-07-08 MED ORDER — CHLORHEXIDINE GLUCONATE 0.12 % MT SOLN
OROMUCOSAL | Status: AC
  Filled 2024-07-08: qty 15

## 2024-07-08 MED ORDER — PHENYLEPHRINE HCL (PRESSORS) 10 MG/ML IV SOLN
INTRAVENOUS | Status: DC | PRN
  Administered 2024-07-08 (×2): 160 ug via INTRAVENOUS
  Administered 2024-07-08: 50 ug via INTRAVENOUS
  Administered 2024-07-08: 80 ug via INTRAVENOUS

## 2024-07-08 MED ORDER — BUPIVACAINE LIPOSOME 1.3 % IJ SUSP
INTRAMUSCULAR | Status: AC
  Filled 2024-07-08: qty 20

## 2024-07-08 MED ORDER — 0.9 % SODIUM CHLORIDE (POUR BTL) OPTIME
TOPICAL | Status: DC | PRN
  Administered 2024-07-08: 1000 mL

## 2024-07-08 MED ORDER — LIDOCAINE HCL (PF) 2 % IJ SOLN
INTRAMUSCULAR | Status: AC
  Filled 2024-07-08: qty 5

## 2024-07-08 MED ORDER — EPHEDRINE SULFATE-NACL 50-0.9 MG/10ML-% IV SOSY
PREFILLED_SYRINGE | INTRAVENOUS | Status: DC | PRN
  Administered 2024-07-08 (×2): 5 mg via INTRAVENOUS

## 2024-07-08 MED ORDER — CHLORHEXIDINE GLUCONATE 0.12 % MT SOLN
15.0000 mL | Freq: Once | OROMUCOSAL | Status: AC
  Administered 2024-07-08: 15 mL via OROMUCOSAL

## 2024-07-08 MED ORDER — SUCCINYLCHOLINE CHLORIDE 200 MG/10ML IV SOSY
PREFILLED_SYRINGE | INTRAVENOUS | Status: AC
  Filled 2024-07-08: qty 10

## 2024-07-08 MED ORDER — RIVAROXABAN 10 MG PO TABS: 10.0000 mg | ORAL_TABLET | Freq: Every day | ORAL | 0 refills | Status: DC

## 2024-07-08 MED ORDER — ONDANSETRON HCL 4 MG/2ML IJ SOLN
INTRAMUSCULAR | Status: AC
  Filled 2024-07-08: qty 2

## 2024-07-08 MED ORDER — ACETAMINOPHEN 325 MG PO TABS: 325.0000 mg | ORAL_TABLET | Freq: Four times a day (QID) | ORAL | Status: DC | PRN

## 2024-07-08 MED ORDER — ROCURONIUM BROMIDE 100 MG/10ML IV SOLN
INTRAVENOUS | Status: DC | PRN
  Administered 2024-07-08: 5 mg via INTRAVENOUS

## 2024-07-08 MED ORDER — SUCCINYLCHOLINE CHLORIDE 200 MG/10ML IV SOSY
PREFILLED_SYRINGE | INTRAVENOUS | Status: DC | PRN
  Administered 2024-07-08: 60 mg via INTRAVENOUS

## 2024-07-08 MED ORDER — DEXAMETHASONE SODIUM PHOSPHATE 10 MG/ML IJ SOLN
INTRAMUSCULAR | Status: AC
  Filled 2024-07-08: qty 1

## 2024-07-08 MED ORDER — DEXAMETHASONE SODIUM PHOSPHATE 10 MG/ML IJ SOLN
INTRAMUSCULAR | Status: DC | PRN
  Administered 2024-07-08: 5 mg via INTRAVENOUS

## 2024-07-08 MED ORDER — TRANEXAMIC ACID-NACL 1000-0.7 MG/100ML-% IV SOLN
INTRAVENOUS | Status: AC
  Filled 2024-07-08: qty 100

## 2024-07-08 SURGICAL SUPPLY — 56 items
BENZOIN TINCTURE PRP APPL 2/3 (GAUZE/BANDAGES/DRESSINGS) IMPLANT
BLADE SAW 90X13X1.19 OSCILLAT (BLADE) ×1 IMPLANT
BLADE SAW SAG 25X90X1.19 (BLADE) ×1 IMPLANT
BLADE SURG SZ20 CARB STEEL (BLADE) ×1 IMPLANT
BNDG COMPR 6X5.8 VLCR NS LF (GAUZE/BANDAGES/DRESSINGS) ×1 IMPLANT
CEMENT BONE R 1X40 (Cement) ×2 IMPLANT
CEMENT VACUUM MIXING SYSTEM (MISCELLANEOUS) ×1 IMPLANT
CHLORAPREP W/TINT 26 (MISCELLANEOUS) ×1 IMPLANT
COMPONENT PTLLAR PEGX3 28X6.2 (Knees) IMPLANT
COMPONENT TIB CMT KNEE C NP LT (Joint) IMPLANT
COOLER ICEMAN CLASSIC (MISCELLANEOUS) ×1 IMPLANT
COVER MAYO STAND STRL (DRAPES) ×1 IMPLANT
CUFF TOURN SGL QUICK 18 (TOURNIQUET CUFF) IMPLANT
DRAPE IMP U-DRAPE 54X76 (DRAPES) ×1 IMPLANT
DRAPE SHEET LG 3/4 BI-LAMINATE (DRAPES) ×1 IMPLANT
DRAPE U-SHAPE 47X51 STRL (DRAPES) ×1 IMPLANT
DRSG MEPILEX SACRM 8.7X9.8 (GAUZE/BANDAGES/DRESSINGS) IMPLANT
DRSG OPSITE POSTOP 4X10 (GAUZE/BANDAGES/DRESSINGS) ×1 IMPLANT
DRSG OPSITE POSTOP 4X8 (GAUZE/BANDAGES/DRESSINGS) ×1 IMPLANT
ELECT CAUTERY BLADE 6.4 (BLADE) ×1 IMPLANT
ELECTRODE REM PT RTRN 9FT ADLT (ELECTROSURGICAL) ×1 IMPLANT
GAUZE XEROFORM 1X8 LF (GAUZE/BANDAGES/DRESSINGS) ×1 IMPLANT
GLOVE BIO SURGEON STRL SZ7.5 (GLOVE) ×4 IMPLANT
GLOVE BIO SURGEON STRL SZ8 (GLOVE) ×4 IMPLANT
GLOVE BIOGEL PI IND STRL 8 (GLOVE) ×1 IMPLANT
GLOVE INDICATOR 8.0 STRL GRN (GLOVE) ×1 IMPLANT
GOWN STRL REUS W/ TWL LRG LVL3 (GOWN DISPOSABLE) ×1 IMPLANT
GOWN STRL REUS W/ TWL XL LVL3 (GOWN DISPOSABLE) ×1 IMPLANT
HOOD PEEL AWAY T7 (MISCELLANEOUS) ×3 IMPLANT
IMMOB KNEE 14 THIGH 24 706614 (SOFTGOODS) IMPLANT
INSERT ARTISURF SZ 6-7 LT (Insert) IMPLANT
KIT TURNOVER KIT A (KITS) ×1 IMPLANT
KNEE SYSTEM FEMUR SZ 6 LT (Knees) IMPLANT
MANIFOLD NEPTUNE II (INSTRUMENTS) ×1 IMPLANT
NDL SPNL 20GX3.5 QUINCKE YW (NEEDLE) ×1 IMPLANT
NEEDLE SPNL 20GX3.5 QUINCKE YW (NEEDLE) ×1 IMPLANT
NS IRRIG 500ML POUR BTL (IV SOLUTION) ×1 IMPLANT
PACK TOTAL KNEE (MISCELLANEOUS) ×1 IMPLANT
PAD COLD UNI WRAP-ON (PAD) ×1 IMPLANT
PENCIL SMOKE EVACUATOR (MISCELLANEOUS) ×1 IMPLANT
PIN DRILL HDLS TROCAR 75 4PK (PIN) IMPLANT
SCREW FEMALE HEX FIX 25X2.5 (ORTHOPEDIC DISPOSABLE SUPPLIES) IMPLANT
SOL .9 NS 3000ML IRR UROMATIC (IV SOLUTION) ×1 IMPLANT
STAPLER SKIN PROX 35W (STAPLE) ×1 IMPLANT
STOCKINETTE IMPERV 14X48 (MISCELLANEOUS) ×1 IMPLANT
STRIP CLOSURE SKIN 1/4X4 (GAUZE/BANDAGES/DRESSINGS) IMPLANT
SUCTION TUBE FRAZIER 10FR DISP (SUCTIONS) ×1 IMPLANT
SUT VIC AB 0 CT1 36 (SUTURE) ×3 IMPLANT
SUT VIC AB 2-0 CT1 TAPERPNT 27 (SUTURE) ×3 IMPLANT
SUT VIC AB 3-0 SH 27X BRD (SUTURE) IMPLANT
SYR 10ML LL (SYRINGE) ×1 IMPLANT
SYR 20ML LL LF (SYRINGE) ×1 IMPLANT
SYR 30ML LL (SYRINGE) IMPLANT
TIP FAN IRRIG PULSAVAC PLUS (DISPOSABLE) ×1 IMPLANT
TRAP FLUID SMOKE EVACUATOR (MISCELLANEOUS) ×1 IMPLANT
WATER STERILE IRR 500ML POUR (IV SOLUTION) ×1 IMPLANT

## 2024-07-08 NOTE — Discharge Instructions (Addendum)
 Orthopedic discharge instructions: May shower with intact OpSite dressing. Apply ice frequently to knee or use Polar Care. Start Xarelto  1 tablet (10 mg) daily on Friday, 06/25/2022, for 2 weeks, then take aspirin  325 mg daily for 4 weeks. Take pain medication as prescribed when needed.  May supplement with ES Tylenol  if necessary. May weight-bear as tolerated on left leg - use walker for balance and support. Follow-up in 10-14 days or as scheduled.

## 2024-07-08 NOTE — Anesthesia Preprocedure Evaluation (Signed)
 Anesthesia Evaluation  Patient identified by MRN, date of birth, ID band Patient awake    Reviewed: Allergy & Precautions, NPO status , Patient's Chart, lab work & pertinent test results  Airway Mallampati: III  TM Distance: <3 FB Neck ROM: full    Dental  (+) Chipped   Pulmonary sleep apnea , COPD, Current Smoker and Patient abstained from smoking.   Pulmonary exam normal        Cardiovascular (-) angina + CAD  (-) Past MI Normal cardiovascular exam     Neuro/Psych  Headaches  Neuromuscular disease  negative psych ROS   GI/Hepatic Neg liver ROS, hiatal hernia,GERD  Controlled,,  Endo/Other  negative endocrine ROS    Renal/GU      Musculoskeletal   Abdominal   Peds  Hematology negative hematology ROS (+)   Anesthesia Other Findings Past Medical History: 05/2021: Acute deep vein thrombosis (DVT) of left lower extremity  (HCC) 01/2007: Acute deep vein thrombosis (DVT) of right lower extremity  (HCC)     Comment:  a.) occurred postoperatively; s/p IVC filter placement 04/24/2018: Acute deep vein thrombosis (DVT) of right lower extremity  (HCC) 12/13/2014: Amaurosis fugax     Comment:   Brain MRA suggested vertebral artery partial occlusion               75 to 90% stenosed. Will treat for prevention of embolic               stroke,  Refer to [facility] for CTA and               stent and refer to cardiology for ECHO  No date: Anxiety     Comment:  a.) on BZO (clonazepam ) PRN No date: Aortic atherosclerosis (HCC) No date: Arthritis No date: B12 deficiency 03/08/2014: Carpal tunnel syndrome No date: Cataracts, bilateral No date: Chronic migraine without aura, with intractable migraine, so  stated, with status migrainosus 05/24/2021: Clostridium difficile infection No date: COPD (chronic obstructive pulmonary disease) (HCC) No date: Crohn's disease (HCC) No date: Depression 12/06/2021: Dog  bite 04/24/2018: DVT (deep venous thrombosis) (HCC) 12/04/2017: Dysphagia 07/11/2018: Facial twitching No date: GERD (gastroesophageal reflux disease) No date: Hematochezia 06/14/1997: History of cardiac catheterization     Comment:  a.) LHC 06/14/1997 at [facility]: EF 60%; normal coronaries;               no obstructive CAD. No date: History of hiatal hernia No date: History of kidney stones No date: Hx of splenectomy     Comment:  a.) reports spleen was ruptured during routine               colonoscopy in [place] -- necessitated surgical resection 05/20/2019: Intractable chronic migraine without aura and without  status migrainosus No date: Leukocytosis No date: Moderate tricuspid insufficiency 12/2016: MRSA colonization No date: Osteoporosis No date: Pneumonia No date: Primary osteoarthritis of left knee 01/24/2018: Reflux esophagitis No date: Small bowel obstruction (HCC)     Comment:  a.) 2001, 2004 (reversal of jejunal bypass). b.) s/p               laparotomy and lysis of adhesions 01/2007 2001: Small bowel obstruction (HCC) No date: Spinal stenosis of lumbar region No date: TIA (transient ischemic attack) No date: Vitamin D  deficiency  Past Surgical History: No date: ANAL SPHINCTER PROSTHESIS PLACEMENT No date: APPENDECTOMY 01/13/2024: APPLICATION OF WOUND VAC     Comment:  Procedure: APPLICATION, WOUND VAC;  Surgeon: Edie,  Norleen PARAS, MD;  Location: [facility] OR[facility];  Service: Orthopedics;; No date: BACK SURGERY     Comment:  spinal fusion 06/14/1997: CARDIAC CATHETERIZATION; Left     Comment:  Procedure: CARDIAC CATHETERIZATION; Location: [facility];               Surgeon: Lynwood Falls, MD 04/22/2014: CARPAL TUNNEL RELEASE; Right No date: CARPAL TUNNEL RELEASE; Left No date: CATARACT EXTRACTION; Bilateral 2005: CERVICAL FUSION     Comment:  C6-7, anterior approach No date: CHOLECYSTECTOMY No date: COLONOSCOPY     Comment:  2005, 2007, 2010, 2015, 2020,  2021 05/10/2019: COLONOSCOPY WITH PROPOFOL ; N/A     Comment:  Procedure: COLONOSCOPY WITH PROPOFOL ;  Surgeon: Viktoria Lamar DASEN, MD;  Location: [facility] ENDOSCOPY;  Service:               Endoscopy;  Laterality: N/A; 02/11/2018: ESOPHAGEAL MANOMETRY; N/A     Comment:  Procedure: ESOPHAGEAL MANOMETRY (EM);  Surgeon: Jinny Carmine, MD;  Location: [facility] ENDOSCOPY;  Service:               Endoscopy;  Laterality: N/A; No date: ESOPHAGOGASTRODUODENOSCOPY     Comment:  1996, 2006, 2008, 2010, 2014, 2018, 2021 12/17/2017: ESOPHAGOGASTRODUODENOSCOPY (EGD) WITH PROPOFOL ; N/A     Comment:  Procedure: ESOPHAGOGASTRODUODENOSCOPY (EGD) WITH               PROPOFOL ;  Surgeon: Dessa Reyes ORN, MD;  Location:               [facility] ENDOSCOPY;  Service: Endoscopy;  Laterality: N/A; 01/08/2018: ESOPHAGOGASTRODUODENOSCOPY (EGD) WITH PROPOFOL ; N/A     Comment:  Procedure: ESOPHAGOGASTRODUODENOSCOPY (EGD) WITH               PROPOFOL ;  Surgeon: Dessa Reyes ORN, MD;  Location:               [facility] ENDOSCOPY;  Service: Endoscopy;  Laterality: N/A; 09/18/2021: EXCISION NEUROMA; Left     Comment:  Procedure: Excision of traumatic neuroma infrapatellar               branch saphenous nerve left knee.;  Surgeon: Edie Norleen PARAS, MD;  Location: [facility] ORS;  Service: Orthopedics;                Laterality: Left; No date: GIVENS CAPSULE STUDY     Comment:  2005, 2006 01/13/2024: I & D EXTREMITY; Right     Comment:  Procedure: IRRIGATION AND DEBRIDEMENT WITH DELAYED               PRIMARY CLOSURE OF RIGHT KNEE WOUND.;  Surgeon: Edie Norleen PARAS, MD;  Location: [facility];  Service: Orthopedics;                Laterality: Right; No date: INSERTION OF VENA CAVA FILTER; Right 06/22/2020: KNEE ARTHROSCOPY WITH MEDIAL MENISECTOMY; Left     Comment:  Procedure: Left knee arthroscopic partial medial               meniscectomy;  Surgeon: Tobie Priest, MD;  Location:  [facility];  Service: Orthopedics;  Laterality:               Left; 05/25/2021: KNEE ARTHROSCOPY WITH MEDIAL MENISECTOMY; Left     Comment:  Procedure: Left knee arthroscopy, infrapatellar fat pad               excision;  Surgeon: Tobie Priest, MD;  Location: [facility]               ORS;  Service: Orthopedics;  Laterality: Left; 01/30/2007: LAPAROTOMY     Comment:  for bowel obstruction with LOA 12/25/2016: LUMBAR LAMINECTOMY/DECOMPRESSION MICRODISCECTOMY; N/A     Comment:  Procedure: LUMBAR DECOMPRESSION L4-5;  Surgeon: Reeves Daisy, MD;  Location: [facility] ORS;  Service:               Neurosurgery;  Laterality: N/A; 11/06/2021: LUMBAR, FAR LATERAL DISCECTOMY     Comment:  L4-5 No date: NISSEN FUNDOPLICATION No date: ROTATOR CUFF REPAIR; Bilateral 03/04/2023: SHOULDER ARTHROSCOPY WITH SUBACROMIAL DECOMPRESSION,  ROTATOR CUFF REPAIR AND BICEP TENDON REPAIR; Left     Comment:  Procedure: LEFT SHOULDER ARTHROSCOPY WITH DEBRIDEMENT,               DECOMPRESSION, ROATOR CUFF REPAIR, AND POSSIBLE BICEPS               TENODESIS. - RNFA;  Surgeon: Edie Norleen PARAS, MD;                Location: [facility] OR[facility];  Service: Orthopedics;  Laterality:               Left; No date: SPLENECTOMY     Comment:  secondary to colonoscopy No date: TONSILLECTOMY AND ADENOIDECTOMY 12/02/2023: TOTAL KNEE ARTHROPLASTY; Right     Comment:  Procedure: TOTAL KNEE ARTHROPLASTY;  Surgeon: Edie Norleen PARAS, MD;  Location: [facility];  Service: Orthopedics;                Laterality: Right; 04/01/2022: TRANSFORAMINAL LUMBAR INTERBODY FUSION (TLIF) WITH  PEDICLE SCREW FIXATION 1 LEVEL; N/A     Comment:  Procedure: OPEN L4-5 TRANSFORAMINAL LUMBAR INTERBODY               FUSION (TLIF);  Surgeon: Daisy Reeves, MD;                Location: [facility] ORS;  Service: Neurosurgery;  Laterality:               N/A; 08/25/2015: TRIGGER FINGER RELEASE; Left 09/18/2015: TRIGGER FINGER RELEASE; Right No  date: VAGINAL HYSTERECTOMY  BMI    Body Mass Index: 14.53 kg/m      Reproductive/Obstetrics negative OB ROS                              Anesthesia Physical Anesthesia Plan  ASA: 3  Anesthesia Plan: General ETT   Post-op Pain Management:    Induction: Intravenous  PONV Risk Score and Plan: Ondansetron , Dexamethasone , Midazolam  and Treatment may vary due to age or medical condition  Airway Management Planned: Oral ETT  Additional Equipment:   Intra-op Plan:   Post-operative Plan: Extubation in OR  Informed Consent: I have reviewed the patients History and Physical, chart, labs and discussed the procedure including the risks, benefits and alternatives for the proposed  anesthesia with the patient or authorized representative who has indicated his/her understanding and acceptance.     Dental Advisory Given  Plan Discussed with: Anesthesiologist, CRNA and Surgeon  Anesthesia Plan Comments: (Patient declines spinal  Patient consented for risks of anesthesia including but not limited to:  - adverse reactions to medications - damage to eyes, teeth, lips or other oral mucosa - nerve damage due to positioning  - sore throat or hoarseness - Damage to heart, brain, nerves, lungs, other parts of body or loss of life  Patient voiced understanding and assent.)        Anesthesia Quick Evaluation

## 2024-07-08 NOTE — Op Note (Signed)
 07/08/2024  10:04 AM  Patient:   Tamara [name]  Pre-Op Diagnosis:   Degenerative joint disease, left knee.  Post-Op Diagnosis:   Same  Procedure:   Left TKA using all-cemented Zimmer Persona system with a # 6 narrow PCR femur, a(n) C-sized  tibial tray with a 10 mm medial congruent E-poly insert, and a 6.2 x 28 mm Vanguard all-poly 3-pegged domed patella.  Surgeon:   DOROTHA Reyes Maltos, MD  Assistant:   Gustavo Level, PA-C   Anesthesia:   GET  Findings:   As above  Complications:   None  EBL:   10 cc  Fluids:   600 cc crystalloid  UOP:   None  TT:   95 minutes at 300 mmHg  Drains:   None  Closure:   Staples  Implants:   As above  Brief Clinical Note:   The patient is a 78 year old female with a long history of progressively worsening primarily lateral sided left knee pain. The patient's symptoms have progressed despite medications, activity modification, injections, etc. The patient's history and examination were consistent with advanced degenerative joint disease of the left knee confirmed by plain radiographs. The patient presents at this time for a left total knee arthroplasty.  Procedure:   The patient was brought into the operating room and laid in the supine position.  After adequate general endotracheal intubation and anesthesia was obtained, the left lower extremity was prepped with ChloraPrep solution and draped sterilely. Preoperative antibiotics were administered. A timeout was performed to verify the appropriate surgical site before the limb was exsanguinated with an Esmarch and the tourniquet inflated to 300 mmHg.   A standard anterior approach to the knee was made through an approximately 5-6 inch incision. The incision was carried down through the subcutaneous tissues to expose superficial retinaculum. This was split the length of the incision and the medial flap elevated sufficiently to expose the medial retinaculum. The medial retinaculum was incised, leaving a  3-4 mm cuff of tissue on the patella. This was extended distally along the medial border of the patellar tendon and proximally through the medial third of the quadriceps tendon. A subtotal fat pad excision was performed before the soft tissues were elevated off the anteromedial and anterolateral aspects of the proximal tibia to the level of the collateral ligaments. The anterior portions of the medial and lateral menisci were removed, as was the anterior cruciate ligament. With the knee flexed to 90, the external tibial guide was positioned and the appropriate proximal tibial cut made. This piece was taken to the back table where it was measured and found to be optimally replicated by a(n) C-sized component.  Attention was directed to the distal femur. The intramedullary canal was accessed through a 3/8 drill hole. The intramedullary guide was inserted and placed at 5 of valgus alignment. Using the +0 slot, the distal cut was made. The distal femur was measured and found to be optimally replicated by the #6 component. The #6 4-in-1 cutting block was positioned and first the posterior, then the posterior chamfer, the anterior, and finally the anterior chamfer cuts were made after verifying that the anterior cortex would not be notched.   At this point, the posterior portions medial and lateral menisci were removed. A trial reduction was performed using the appropriate femoral and tibial components with the 10 mm insert. This demonstrated excellent stability to varus and valgus stressing both in flexion and extension while permitting full extension. Patellar tracking was assessed and found  to be excellent. Therefore, the tibial trial position was marked on the proximal tibia. The patella thickness was measured and found to be 18 mm. Therefore, the appropriate cut was made. The patellar surface was measured and found to be optimally replicated by the 28 mm Vanguard component. The three peg holes were drilled in  place before the trial button was inserted. Patella tracking was assessed and found to be excellent, passing the no thumb test. The lug holes were drilled into the distal femur before the trial component was removed.  The tibial tray was repositioned before the keel was created using the appropriate tower, reamer, and punch.  The bony surfaces were prepared for cementing by irrigating them thoroughly with sterile saline solution via the jet lavage system. A bone plug was fashioned from some of the bone that had been removed previously and used to plug the distal femoral canal. In addition, a cocktail of 20 cc of Exparel , 30 cc of 0.5% Sensorcaine , 2 cc of Kenalog  40 (80 mg), and 30 mg of Toradol  diluted out to 90 cc with normal saline was injected into the postero-medial and postero-lateral aspects of the knee, the medial and lateral gutter regions, and the peri-incisional tissues to help with postoperative analgesia. Meanwhile, the cement was being mixed on the back table.   When the cement was ready, the tibial tray was cemented in first. The excess cement was removed using Personal assistant. Next, the femoral component was impacted into place. Again, the excess cement was removed using Personal assistant. The 10 mm trial insert was positioned and the knee brought into extension while the cement hardened. Finally, the patella was cemented into place and secured using the patellar clamp. Again, the excess cement was removed using Personal assistant. Once the cement had hardened, the knee was placed through a range of motion with the findings as described above. Therefore, the trial insert was removed and, after verifying that no cement had been retained posteriorly, the permanent 10 mm medial congruent E-polyethylene insert was snapped into place with care taken to ensure appropriate locking of the insert. Again the knee was placed through a range of motion with the findings as described above.  The wound was  copiously irrigated with sterile saline solution using the jet lavage system before the quadriceps tendon and retinacular layer were reapproximated using #0 Vicryl interrupted sutures. The superficial retinacular layer also was closed using a running #0 Vicryl suture. The subcutaneous tissues were closed in several layers using 2-0 and 3-0 Vicryl interrupted sutures. The skin was closed using benzoin and Steri-Strips. A sterile honeycomb dressing was applied to the skin before the leg was wrapped with an Ace wrap to accommodate the Polar Care device. The patient was placed into a knee immobilizer before being awakened, extubated, and returned to the recovery room in satisfactory condition after tolerating the procedure well.

## 2024-07-08 NOTE — H&P (Signed)
 HPI:  Tamara [name] is a 78 y.o. female who presents today for her surgical history and physical for upcoming left total knee arthroplasty. Surgery was scheduled Dr. Edie on 07/08/2024. The patient denies any changes in her medical history since she was last evaluated. She denies any falls or trauma affecting left knee since last appointment. She denies any numbness or ting to the left lower extremity. She does continue reporting moderate aching and throbbing discomfort throughout the left knee involving the medial and lateral joint line. The patient denies any personal history of heart attack, stroke, asthma or COPD. She does have a history of a DVT in the past. She is currently not on any blood thinning medication at this time.  Current Outpatient Medications:  acetaminophen  (TYLENOL ) 500 MG tablet Take 500 mg by mouth every 6 (six) hours  albuterol  MDI, PROVENTIL , VENTOLIN , PROAIR , HFA 90 mcg/actuation inhaler Inhale 2 inhalations into the lungs every 4 (four) hours as needed for Wheezing or Shortness of Breath (Patient not taking: Reported on 06/29/2024)  bacitracin  zinc-polymyxin B  (POLYSPORIN) ointment Apply topically once daily (Patient not taking: Reported on 06/29/2024) 28 g 0  clonazePAM  (KLONOPIN ) 0.5 MG tablet Take 0.5 mg by mouth at bedtime as needed for Anxiety  cyanocobalamin  (VITAMIN B12) 1,000 mcg/mL injection Inject into the muscle monthly  dicyclomine (BENTYL) 10 mg capsule Take 1 capsule (10 mg total) by mouth 3 (three) times daily as needed (Patient not taking: Reported on 06/29/2024) 90 capsule 1  doxycycline  (VIBRAMYCIN ) 100 MG capsule Take 100 mg by mouth 2 (two) times daily (Patient not taking: Reported on 06/29/2024)  fluticasone  propionate (FLONASE ) 50 mcg/actuation nasal spray Place 2 sprays into one nostril once daily (Patient not taking: Reported on 06/29/2024)  nortriptyline  (PAMELOR ) 25 MG capsule Take 25 mg by mouth nightly  omeprazole (PRILOSEC) 40 MG DR capsule Take 40  mg by mouth 2 (two) times daily before meals  rosuvastatin  (CRESTOR ) 10 MG tablet Take 10 mg by mouth once daily  sertraline  (ZOLOFT ) 100 MG tablet Take 100 mg by mouth once daily  traZODone  (DESYREL ) 100 MG tablet Take 100 mg by mouth at bedtime  umeclidinium (INCRUSE ELLIPTA ) 62.5 mcg/actuation DsDv inhalation unit Inhale 1 Puff into the lungs once daily   Allergies:  Eliquis [Apixaban] Nausea and Dizziness  Meloxicam  Itching  Sulfa (Sulfonamide Antibiotics) Rash   Past Medical History:  Abdominal pain 10/18/2014  Last Assessment & Plan: Secondary to IBD and adhesions.  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 Refills given Lab Results Component Value Date VITAMINB12 263 01/26/2014  Anxiety state 05/06/2021  Last Assessment & Plan: Formatting of this note might be different from the original. secondary to son's recent AMI. She has been taking sertraline  for years, 50 mg dose along with clonazepam  for insomnia. Advised to increase zoloft  to 100 mg daily  Aortic atherosclerosis () 07/19/2020  Last Assessment & Plan: Formatting of this note might be different from the original. Reviewed findings of prior CT scan today.. Patient is willing to Initiate statin therpay starting with 10 mg crestor  once daily Last Assessment & Plan: Formatting of this note might be different from the original. Reviewed findings of prior CT scan today.. Patient is tolerating statin therapy with 10 mg cre  B12 deficiency 01/26/2014  Last Assessment & Plan: Formatting of this note is different from the original. Advised to increase oral intake to daily. Repeat level needed Lab Results Component Value Date VITAMINB12  290 04/15/2016 Last Assessment & Plan: Formatting of this note is different from the original. Recurrent due to patient nonadherence to monthly injection schedule. She has crohn's of the small intestine an  Carpal tunnel syndrome  03/08/2014  Last Assessment & Plan: S/p CT release sugery by Micheal Minz. Last month. Doing well post operatively  Cataracts, bilateral  Chest pain 05/11/2013  Last Assessment & Plan: Recurrent, Atypical. Multiple CRFs. EKG and stat cardiac enzymes were negative for ischemic changes. Refer to [facility] for further evaluation  Chronic migraine without aura 09/10/2014  Last Assessment & Plan: Workup for vascular cause done after last visit, Advised her to increase pamelor  to 25 mg DAILY.  Cough 03/08/2014  Last Assessment & Plan: Lungs clear but given long term cough in smoker, will get CXR today.  Crohn's disease (CMS/HHS-HCC)  diagnosed 2005  Crohn's disease in remission (CMS/HHS-HCC) 04/28/2019  DVT (deep venous thrombosis) (CMS/HHS-HCC) 04/24/2018  Emphysema with chronic bronchitis (CMS/HHS-HCC) 01/28/2020  Last Assessment & Plan: Formatting of this note might be different from the original. She is asymptomatic currently and have been prescribed maintenance doses of Spiriva  and Symbicort  Encounter for preventive health examination 05/11/2013  Last Assessment & Plan: Sh eis at high risk for stroke and CAD . wellbutrin  prescribed today after long discussio nof pros and cons of pharmacotherapy. Last Assessment & Plan: Formatting of this note is different from the original. She declines mammograms due to pain of procedure. She us  up to date on other screenings. age appropriate education and counseling updated, referrals for preventat  Extremity pain 03/17/2014  GERD (gastroesophageal reflux disease)  Gouty arthropathy 10/08/2014  Headache 03/17/2014  Hematochezia 09/07/2020  History of bone density study 06/01/2009  History of bone density study 03/24/2012  History of Clostridium difficile infection 05/24/2021  History of hepatic disease 10/18/2014  Overview: s/p laparotomy/ LOA 3/08, prior reversal of jejunal bypasss 2004  History of tobacco abuse 01/26/2014  Last Assessment & Plan: Formatting of this note  might be different from the original. She stopped smoking Oct 2021 using Wellbutrin   Intractable chronic migraine without aura and without status migrainosus 05/20/2019  Low vitamin D  level 05/01/2019  Malaise 03/08/2014  Last Assessment & Plan: With several other long term complaints. We made an appointment with her PCP, Dr. Marylynn for tomorrow to discuss these concerns.  Malnutrition (CMS/HHS-HCC) 02/20/2018  Last Assessment & Plan: Formatting of this note might be different from the original. I have reviewed her diet and recommended that she increase her protein and fat intake while monitoring her carbohydrates.  Moderate tricuspid insufficiency 12/15/2014  Numbness 03/17/2014  Orthostatic hypotension 12/13/2014  Last Assessment & Plan: Symptomatic ,reviewed list of meds and not taking a diuretic. Her Am cortisol was normal today, Advised to increase hydration and stop prn use of spironolactone , whci she has NOT been taking, . Will consider referral to cardiology for POTS if persistent.  OSA (obstructive sleep apnea) 08/01/2019  Formatting of this note might be different from the original. Mild by home 2 night study Done in June 2020. autotitrating CPAP ordered Sept 21 2020 Last Assessment & Plan: Formatting of this note might be different from the original. Diagnosed by prior sleep study. Patient is using CPAP every night a minimum of 6 hours per night and notes improved daytime wakefulness and decreased fatigue  Osteoporosis  Pain in both upper extremities 08/24/2015  Personal history of other diseases of the digestive system 02/20/2015  Overview: s/p laparotomy/ LOA 3/08, prior reversal of jejunal bypasss  2004  Personal history of venous thrombosis and embolism 01/10/2007  Overview: post operative right leg, s/p vena cava filter  Pharyngoesophageal dysphagia 09/07/2020  Primary osteoarthritis of left knee 06/24/2017  Reflux esophagitis 01/24/2018  Last Assessment & Plan: Formatting of this note might be  different from the original. Managed with omeprazole 20 mg daily  Regional enteritis (CMS/HHS-HCC) 10/18/2014  Last Assessment & Plan: Managed by Dr. Viktoria with mesalamine  Right lower quadrant abdominal pain 01/30/2021  S/P carpal tunnel release 05/18/2014  SBO (small bowel obstruction) (CMS/HHS-HCC) 2001 and 2004  Spinal stenosis, lumbar region, with neurogenic claudication 11/08/2016  Last Assessment & Plan: Formatting of this note might be different from the original. Confirmed by MRI with bilateral L4 nervie root impingement as well. Recommending neurosurgical referral.  Status post carpal tunnel release 04/29/2014  Steroid-induced osteoporosis 11/19/2011  Last Assessment & Plan: Formatting of this note might be different from the original. Given her smoking (ongoing) and history of hiatal hernia repair, Her options appear to be limited to Reclast  given failure of Prolia  due to cost. She sees endocrinology next week. Reviewed calcium  and vitamin D  needs. Last Assessment & Plan: Formatting of this note might be different from the original. Her op  Tingling 03/01/2015  Tobacco abuse 03/23/2014  Underweight 08/27/2015  Last Assessment & Plan: Formatting of this note might be different from the original. Chronic, with slight improvement following resolution of dysphagia with Nissen funduplication takedown Last Assessment & Plan: Formatting of this note might be different from the original. She refuses to regard er low body weight as pathologic and does not want to gai weight. She is not bulemic or anorexic.  Vision problems 03/17/2014  Vitamin D  deficiency 08/27/2015  Last Assessment & Plan: Formatting of this note might be different from the original. Recurrent, Will continue weekly vitamin d  50,000 IUs Last Assessment & Plan: Formatting of this note is different from the original. With osteoporosis and recent fall and fracture. Continue current supplementation Last vitamin D  Lab Results Component Value  Date VD25OH 43.98 01/29/2021  Weight loss, abnormal 09/07/2020   Past Surgical History:  SIGMOIDOSCOPY FLEXIBLE 05/22/1998  COLONOSCOPY 04/25/2004 (Hyperplastic Polyp)  CAPSULE ENDOSCOPY 05/02/2004  FUSION OF C6-7 ANTERIOR CERVICAL APPROACH 10/30/2004  CAPSULE ENDOSCOPY 08/28/2005  Right Carpal Tunnel Release 04/22/2014  Right long finger trigger release Right 09/18/2015  BACK SURGERY 12/25/2016 (L4-L5 decompression)  EGD 07/22/2017 (No repeat per RTE)  COLONOSCOPY 05/10/2019 (PH Crohn's; Adenomatous Polyp: CBF 04/2024)  Left knee arthroscopy, partial medial menisectomy, chondroplasty of patellofemoral and medial compartments Left 06/22/2020 (Dr. Tobie)  COLONOSCOPY 09/26/2020 (Negative colon biopsies/PHx Crohn's/Repeat 28yrs/TKT)  EGD 09/26/2020 (Normal EGD biopsy/PHx GERD/REpeat 4yrs/TKT)  Left knee arthroscopy, partial synovectomy with infrapatellar fat pad debridement, chondroplasty of patellofemoral and medial compartments Left 05/25/2021 (Dr. Tobie)  Excision and reimplantation of traumatic neuroma of infrapatellar branch of saphenous nerve, repair of medial retinacular defect, left knee Left 09/18/2021 (Dr.Julienne Vogler)  Right L4-5 far lateral discectomy 11/06/2021 (Dr Reeves Daisy at [facility])  L4-5 transforaminal lumbar interbody fusion 04/01/2022 (Dr Reeves Daisy at [facility] [place])  Extensive arthroscopic debridement, arthroscopic subacromial decompression, and mini-open rotator cuff repair, left shoulder Left 03/04/2023 (Dr. Edie)  I&D - RT KNEE WOUND 01/13/2024 (Dr. Edie)  ANAL SPHINCTER PROSTHESIS PLACEMENT  APPENDECTOMY  CHOLECYSTECTOMY OPEN  COLONOSCOPY 03/21/2014, 02/01/2009, 01/29/2006 (PH Crohn's Disease: CBF 03/2019) EGD 09/01/2013, 02/07/2009, 01/05/2007, 05/06/2005, 03/17/1995 (No repeat per RTE)  HYSTERECTOMY VAGINAL  Left Long Finger Trigger Release 08/25/15 (Dr Kathlynn)  Spleen removal (patient had a  colonoscopy in [place] and they ruptured her spleen)   TONSILLECTOMY AND ADENOIDECTOMY   Family History  Problem Relation Name Age of Onset  Myocardial Infarction (Heart attack) Mother  Diabetes Mother  Osteoporosis (Thinning of bones) Mother  Rheum arthritis Mother  Myocardial Infarction (Heart attack) Father  Myocardial Infarction (Heart attack) Sister   Social History:   Socioeconomic History:  Marital status: Widowed  Tobacco Use  Smoking status: Every Day  Current packs/day: 0.00  Average packs/day: 1 pack/day for 13.0 years (13.0 ttl pk-yrs)  Types: Cigarettes  Start date: 03/22/2009  Last attempt to quit: 03/22/2022  Years since quitting: 2.2  Smokeless tobacco: Never  Vaping Use  Vaping status: Never Used  Substance and Sexual Activity  Alcohol use: No  Alcohol/week: 0.0 standard drinks of alcohol  Drug use: Never  Sexual activity: Not Currently   Social Drivers of Health:   Physicist, medical Strain: Medium Risk (05/10/2024)  Overall Financial Resource Strain (CARDIA)  Difficulty of Paying Living Expenses: Somewhat hard  Food Insecurity: No Food Insecurity (05/10/2024)  Hunger Vital Sign  Worried About Running Out of Food in the Last Year: Never true  Ran Out of Food in the Last Year: Never true  Recent Concern: Food Insecurity - Food Insecurity Present (03/15/2024)  Received from [facility]  Hunger Vital Sign  Within the past 12 months, you worried that your food would run out before you got the money to buy more.: Never true  Within the past 12 months, the food you bought just didn't last and you didn't have money to get more.: Sometimes true  Transportation Needs: No Transportation Needs (05/10/2024)  PRAPARE - Risk analyst (Medical): No  Lack of Transportation (Non-Medical): No   Review of Systems:  A comprehensive 14 point ROS was performed, reviewed, and the pertinent orthopaedic findings are documented in the HPI.  Physical Exam: BP 122/70  Ht 160 cm (5' 3)  Wt (!) 37.9 kg  (83 lb 9.6 oz)  BMI 14.81 kg/m  General/Constitutional: The patient appears to be well-nourished, well-developed, and in no acute distress. Neuro/Psych: Normal mood and affect, oriented to person, place and time. Eyes: Non-icteric. Pupils are equal, round, and reactive to light, and exhibit synchronous movement. ENT: Unremarkable. Lymphatic: No palpable adenopathy. Respiratory: Lungs clear to auscultation, Normal chest excursion, No wheezes, and Non-labored breathing Cardiovascular: Regular rate and rhythm. No murmurs. and No edema, swelling or tenderness, except as noted in detailed exam. Integumentary: No impressive skin lesions present, except as noted in detailed exam. Musculoskeletal: Unremarkable, except as noted in detailed exam.  Left knee exam: Skin examination of the left knee demonstrates a well-healed previous left knee surgical incision site. There is no evidence for infection such as erythema or purulent drainage. The patient does have moderate tenderness to palpation along the medial lateral joint line left knee. The patient is able to fully extend the left knee is able to flex close to 110 degrees. The patient does have moderate crepitus of range of motion activities.  Imaging: Previous x-rays of the left knee do demonstrate moderate left knee osteoarthritic changes with loss of joint space along the lateral aspect of the knee. Moderate calcific changes involving both the medial and lateral meniscus indicative of underlying pseudogout. No acute fractures identified.  Assessment: 1. Primary osteoarthritis of left knee.    Plan: 1. Treatment options were discussed today with the patient. 2. The patient is scheduled to undergo a left total knee arthroplasty with Dr. Edie  on 07/08/2024. 3. The patient was instructed on the risk and benefits of surgical intervention and wishes to proceed at this time. 4. This document will serve as a surgical history and physical for the  patient. 5. The patient will follow-up per standard postop protocol. 6. She can contact the clinic if she has any questions, new symptoms develop or symptoms worsen.  The procedure was discussed with the patient, as were the potential risks (including bleeding, infection, nerve and/or blood vessel injury, persistent or recurrent pain, failure of the hardware, instability, stiffness, need for further surgery, blood clots, strokes, heart attacks and/or arhythmias, pneumonia, etc.) and benefits. The patient states her understanding and wishes to proceed.    H&P reviewed and patient re-examined. No changes.

## 2024-07-08 NOTE — Anesthesia Postprocedure Evaluation (Signed)
 Anesthesia Post Note  Patient: Tamara [name]  Procedure(s) Performed: ARTHROPLASTY, KNEE, TOTAL (Left: Knee)  Patient location during evaluation: PACU Anesthesia Type: General Level of consciousness: awake and alert Pain management: pain level controlled Vital Signs Assessment: post-procedure vital signs reviewed and stable Respiratory status: spontaneous breathing, nonlabored ventilation and respiratory function stable Cardiovascular status: blood pressure returned to baseline and stable Postop Assessment: no apparent nausea or vomiting Anesthetic complications: no   No notable events documented.   Last Vitals:  Vitals:   07/08/24 1115 07/08/24 1133  BP: (!) 110/54 110/64  Pulse: 90 95  Resp: 14 16  Temp:  (!) 36.2 C  SpO2: 97% 99%    Last Pain:  Vitals:   07/08/24 1133  TempSrc: Temporal  PainSc: 0-No pain                 Fairy POUR Shalimar Mcclain

## 2024-07-08 NOTE — Progress Notes (Signed)
 Patient meets all requirements to be discharged home safely per plan. Patient tolerated po intake, passed PT, voided and received instructions with friend present. Per patient, she is in contact with home health and they will call her tomorrow (Friday) to establish care. Ted hose on patient, KI and iceman.

## 2024-07-08 NOTE — Anesthesia Procedure Notes (Signed)
 Procedure Name: Intubation Date/Time: 07/08/2024 7:42 AM  Performed by: Derick Jansky, RNPre-anesthesia Checklist: Patient identified, Emergency Drugs available, Suction available and Patient being monitored Patient Re-evaluated:Patient Re-evaluated prior to induction Oxygen Delivery Method: Circle system utilized Preoxygenation: Pre-oxygenation with 100% oxygen Induction Type: IV induction Ventilation: Mask ventilation without difficulty Laryngoscope Size: McGrath Grade View: Grade II Tube type: Oral Tube size: 6.5 mm Number of attempts: 1 Airway Equipment and Method: Stylet and Video-laryngoscopy Placement Confirmation: ETT inserted through vocal cords under direct vision, positive ETCO2 and breath sounds checked- equal and bilateral Secured at: 21 cm Tube secured with: Tape Dental Injury: Teeth and Oropharynx as per pre-operative assessment

## 2024-07-08 NOTE — Transfer of Care (Signed)
 Immediate Anesthesia Transfer of Care Note  Patient: Tamara [name]  Procedure(s) Performed: ARTHROPLASTY, KNEE, TOTAL (Left: Knee)  Patient Location: PACU  Anesthesia Type:General  Level of Consciousness: awake, alert , and oriented  Airway & Oxygen Therapy: Patient Spontanous Breathing and Patient connected to face mask oxygen  Post-op Assessment: Report given to RN and Post -op Vital signs reviewed and stable  Post vital signs: stable  Last Vitals:  Vitals Value Taken Time  BP 131/62 07/08/24 10:07  Temp    Pulse 116 07/08/24 10:09  Resp 13 07/08/24 10:09  SpO2 96 % 07/08/24 10:09  Vitals shown include unfiled device data.  Last Pain: There were no vitals filed for this visit.       Complications: No notable events documented.

## 2024-07-08 NOTE — Evaluation (Signed)
 Physical Therapy Evaluation Patient Details Name: Tamara [name] MRN: [id number] DOB: [birth year] Today's Date: 07/08/2024  History of Present Illness  78 y/o female patient presents to hospital for L TKA. She is POD 0 for this evaluation (07/08/2024).  Clinical Impression  Pt is a pleasant 78 year old female who was POD 0 for L TKA. Pt performs bed mobility, transfers, and ambulation with a RW with supervision. Prior to hospitalization, she was independent with ADLs and did not use AD for ambulation. She lives alone, however her son lives close by and is available to help when needed. Pt is still limited in her L knee ROM (15-94degrees). Pt was educated about her weight bearing precautions and was given an HEP packet. Pt demonstrated understanding of the exercises however when performing ankle pumps, she was unable to dorsiflex her LLE citing that her sensation felt like pins and needles from her knee to her feet. While ambulating she demonstrated decreased dorsiflexion on her L and dragged her foot at times during swing phase of gait. She is currently having to look down to make sure she clears her left foot. Would benefit from skilled PT to address above deficits and promote optimal return to PLOF.         If plan is discharge home, recommend the following: A little help with walking and/or transfers;A little help with bathing/dressing/bathroom;Assistance with cooking/housework;Help with stairs or ramp for entrance   Can travel by private vehicle        Equipment Recommendations None recommended by PT  Recommendations for Other Services       Functional Status Assessment Patient has not had a recent decline in their functional status     Precautions / Restrictions Precautions Precautions: Fall Recall of Precautions/Restrictions: Intact Required Braces or Orthoses: Knee Immobilizer - Left (Knee immobilizer in room and donned on pt prior to PT session. No formal orders in  chart.) Restrictions Weight Bearing Restrictions Per Provider Order: Yes LLE Weight Bearing Per Provider Order: Weight bearing as tolerated      Mobility  Bed Mobility Overal bed mobility: Needs Assistance Bed Mobility: Supine to Sit, Sit to Supine     Supine to sit: Supervision Sit to supine: Supervision   General bed mobility comments: Supervision for safety    Transfers Overall transfer level: Needs assistance Equipment used: Rolling walker (2 wheels) Transfers: Sit to/from Stand, Bed to chair/wheelchair/BSC Sit to Stand: Contact guard assist   Step pivot transfers: Contact guard assist       General transfer comment: Verbal cues for sequencing. CGA  for safety.    Ambulation/Gait Ambulation/Gait assistance: Contact guard assist Gait Distance (Feet): 100 Feet Assistive device: Rolling walker (2 wheels) Gait Pattern/deviations: Step-to pattern, Decreased dorsiflexion - left       General Gait Details: Pt demonstrates inability to dorsiflex. Presents with foot drag during swing phase. Required looking at her LEs to safely ambulate d/t decreased sensation and motor.  Stairs Stairs: Yes Stairs assistance: Contact guard assist Stair Management: Two rails, Step to pattern Number of Stairs: 4 General stair comments: CGA for safety. Pt able to perform stairs using step to pattern.  Wheelchair Mobility     Tilt Bed    Modified Rankin (Stroke Patients Only)       Balance Overall balance assessment: No apparent balance deficits (not formally assessed)  Pertinent Vitals/Pain Pain Assessment Pain Assessment: No/denies pain    Home Living Family/patient expects to be discharged to:: Private residence Living Arrangements: Alone Available Help at Discharge: Family (son lives close by and plans to help, if needed) Type of Home: House Home Access: Ramped entrance       Home Layout: One level Home  Equipment: Agricultural consultant (2 wheels);Grab bars - tub/shower      Prior Function Prior Level of Function : Independent/Modified Independent             Mobility Comments: No use of AD for ambulation ADLs Comments: independent     Extremity/Trunk Assessment   Upper Extremity Assessment Upper Extremity Assessment: Overall WFL for tasks assessed    Lower Extremity Assessment Lower Extremity Assessment: LLE deficits/detail LLE Deficits / Details: POD 0 L TKA. Pt reports she was given a nerve block. Gross sensation was assessed and pt unable to feel light touch from superior to knee distal segments. She reports sensation on the plantar aspect of her foot felt like pins and needles. Unable to DF at this time, but able to wiggle her toes LLE Sensation: decreased light touch    Cervical / Trunk Assessment Cervical / Trunk Assessment: Normal  Communication   Communication Communication: No apparent difficulties    Cognition Arousal: Alert Behavior During Therapy: WFL for tasks assessed/performed   PT - Cognitive impairments: No apparent impairments                       PT - Cognition Comments: Pt is pleasant and agreeable to therapy. Following commands: Intact       Cueing Cueing Techniques: Verbal cues     General Comments General comments (skin integrity, edema, etc.): Ice machine and knee immobilizer donned pre and post session. Pillow case inserted between skin and ice machine for skin protection    Exercises Total Joint Exercises Ankle Circles/Pumps: Strengthening, 5 reps Quad Sets: Strengthening, 5 reps Heel Slides: AROM, 5 reps Hip ABduction/ADduction: Strengthening, 5 reps Goniometric ROM: 15-94 degrees   Assessment/Plan    PT Assessment Patient needs continued PT services  PT Problem List Decreased strength;Decreased range of motion;Decreased mobility;Impaired sensation       PT Treatment Interventions DME instruction;Gait training;Functional  mobility training;Therapeutic activities;Therapeutic exercise;Balance training;Neuromuscular re-education;Patient/family education    PT Goals (Current goals can be found in the Care Plan section)  Acute Rehab PT Goals Patient Stated Goal: to go home PT Goal Formulation: With patient Time For Goal Achievement: 07/22/24 Potential to Achieve Goals: Good    Frequency BID     Co-evaluation               AM-PAC PT 6 Clicks Mobility  Outcome Measure Help needed turning from your back to your side while in a flat bed without using bedrails?: None Help needed moving from lying on your back to sitting on the side of a flat bed without using bedrails?: None Help needed moving to and from a bed to a chair (including a wheelchair)?: None Help needed standing up from a chair using your arms (e.g., wheelchair or bedside chair)?: A Little Help needed to walk in hospital room?: A Little Help needed climbing 3-5 steps with a railing? : A Little 6 Click Score: 21    End of Session Equipment Utilized During Treatment: Gait belt Activity Tolerance: Patient tolerated treatment well Patient left: in bed;with family/visitor present Nurse Communication: Mobility status PT Visit Diagnosis: Muscle weakness (generalized) (M62.81);Difficulty  in walking, not elsewhere classified (R26.2)    Time: 8665-8641 PT Time Calculation (min) (ACUTE ONLY): 24 min   Charges:   PT Evaluation $PT Eval Low Complexity: 1 Low PT Treatments $Gait Training: 8-22 mins PT General Charges $$ ACUTE PT VISIT: 1 Visit         Luvenia Avers, SPT   Tamara [name] 07/08/2024, 4:26 PM

## 2024-07-09 ENCOUNTER — Encounter: Payer: Self-pay | Admitting: Surgery

## 2024-07-10 DIAGNOSIS — R1314 Dysphagia, pharyngoesophageal phase: Secondary | ICD-10-CM | POA: Diagnosis not present

## 2024-07-10 DIAGNOSIS — E785 Hyperlipidemia, unspecified: Secondary | ICD-10-CM | POA: Diagnosis not present

## 2024-07-10 DIAGNOSIS — F17210 Nicotine dependence, cigarettes, uncomplicated: Secondary | ICD-10-CM | POA: Diagnosis not present

## 2024-07-10 DIAGNOSIS — G43709 Chronic migraine without aura, not intractable, without status migrainosus: Secondary | ICD-10-CM | POA: Diagnosis not present

## 2024-07-10 DIAGNOSIS — F411 Generalized anxiety disorder: Secondary | ICD-10-CM | POA: Diagnosis not present

## 2024-07-10 DIAGNOSIS — I10 Essential (primary) hypertension: Secondary | ICD-10-CM | POA: Diagnosis not present

## 2024-07-10 DIAGNOSIS — E46 Unspecified protein-calorie malnutrition: Secondary | ICD-10-CM | POA: Diagnosis not present

## 2024-07-10 DIAGNOSIS — I361 Nonrheumatic tricuspid (valve) insufficiency: Secondary | ICD-10-CM | POA: Diagnosis not present

## 2024-07-10 DIAGNOSIS — K5090 Crohn's disease, unspecified, without complications: Secondary | ICD-10-CM | POA: Diagnosis not present

## 2024-07-10 DIAGNOSIS — I700 Atherosclerosis of aorta: Secondary | ICD-10-CM | POA: Diagnosis not present

## 2024-07-10 DIAGNOSIS — E538 Deficiency of other specified B group vitamins: Secondary | ICD-10-CM | POA: Diagnosis not present

## 2024-07-10 DIAGNOSIS — M810 Age-related osteoporosis without current pathological fracture: Secondary | ICD-10-CM | POA: Diagnosis not present

## 2024-07-10 DIAGNOSIS — Z981 Arthrodesis status: Secondary | ICD-10-CM | POA: Diagnosis not present

## 2024-07-10 DIAGNOSIS — M48062 Spinal stenosis, lumbar region with neurogenic claudication: Secondary | ICD-10-CM | POA: Diagnosis not present

## 2024-07-10 DIAGNOSIS — G4733 Obstructive sleep apnea (adult) (pediatric): Secondary | ICD-10-CM | POA: Diagnosis not present

## 2024-07-10 DIAGNOSIS — Z96652 Presence of left artificial knee joint: Secondary | ICD-10-CM | POA: Diagnosis not present

## 2024-07-10 DIAGNOSIS — Z9989 Dependence on other enabling machines and devices: Secondary | ICD-10-CM | POA: Diagnosis not present

## 2024-07-10 DIAGNOSIS — J439 Emphysema, unspecified: Secondary | ICD-10-CM | POA: Diagnosis not present

## 2024-07-10 DIAGNOSIS — Z681 Body mass index (BMI) 19 or less, adult: Secondary | ICD-10-CM | POA: Diagnosis not present

## 2024-07-10 DIAGNOSIS — Z471 Aftercare following joint replacement surgery: Secondary | ICD-10-CM | POA: Diagnosis not present

## 2024-07-10 DIAGNOSIS — Z7901 Long term (current) use of anticoagulants: Secondary | ICD-10-CM | POA: Diagnosis not present

## 2024-07-10 DIAGNOSIS — Z86718 Personal history of other venous thrombosis and embolism: Secondary | ICD-10-CM | POA: Diagnosis not present

## 2024-07-10 DIAGNOSIS — K219 Gastro-esophageal reflux disease without esophagitis: Secondary | ICD-10-CM | POA: Diagnosis not present

## 2024-07-13 DIAGNOSIS — E46 Unspecified protein-calorie malnutrition: Secondary | ICD-10-CM | POA: Diagnosis not present

## 2024-07-14 DIAGNOSIS — Z471 Aftercare following joint replacement surgery: Secondary | ICD-10-CM | POA: Diagnosis not present

## 2024-07-16 DIAGNOSIS — K219 Gastro-esophageal reflux disease without esophagitis: Secondary | ICD-10-CM | POA: Diagnosis not present

## 2024-07-21 DIAGNOSIS — M810 Age-related osteoporosis without current pathological fracture: Secondary | ICD-10-CM | POA: Diagnosis not present

## 2024-07-21 DIAGNOSIS — E785 Hyperlipidemia, unspecified: Secondary | ICD-10-CM | POA: Diagnosis not present

## 2024-07-21 DIAGNOSIS — Z981 Arthrodesis status: Secondary | ICD-10-CM | POA: Diagnosis not present

## 2024-07-21 DIAGNOSIS — Z7901 Long term (current) use of anticoagulants: Secondary | ICD-10-CM | POA: Diagnosis not present

## 2024-07-21 DIAGNOSIS — K5090 Crohn's disease, unspecified, without complications: Secondary | ICD-10-CM | POA: Diagnosis not present

## 2024-07-21 DIAGNOSIS — F17210 Nicotine dependence, cigarettes, uncomplicated: Secondary | ICD-10-CM | POA: Diagnosis not present

## 2024-07-21 DIAGNOSIS — Z471 Aftercare following joint replacement surgery: Secondary | ICD-10-CM | POA: Diagnosis not present

## 2024-07-21 DIAGNOSIS — E46 Unspecified protein-calorie malnutrition: Secondary | ICD-10-CM | POA: Diagnosis not present

## 2024-07-21 DIAGNOSIS — G4733 Obstructive sleep apnea (adult) (pediatric): Secondary | ICD-10-CM | POA: Diagnosis not present

## 2024-07-21 DIAGNOSIS — G43709 Chronic migraine without aura, not intractable, without status migrainosus: Secondary | ICD-10-CM | POA: Diagnosis not present

## 2024-07-21 DIAGNOSIS — E538 Deficiency of other specified B group vitamins: Secondary | ICD-10-CM | POA: Diagnosis not present

## 2024-07-21 DIAGNOSIS — R1314 Dysphagia, pharyngoesophageal phase: Secondary | ICD-10-CM | POA: Diagnosis not present

## 2024-07-21 DIAGNOSIS — F411 Generalized anxiety disorder: Secondary | ICD-10-CM | POA: Diagnosis not present

## 2024-07-21 DIAGNOSIS — I10 Essential (primary) hypertension: Secondary | ICD-10-CM | POA: Diagnosis not present

## 2024-07-21 DIAGNOSIS — K219 Gastro-esophageal reflux disease without esophagitis: Secondary | ICD-10-CM | POA: Diagnosis not present

## 2024-07-21 DIAGNOSIS — Z96652 Presence of left artificial knee joint: Secondary | ICD-10-CM | POA: Diagnosis not present

## 2024-07-21 DIAGNOSIS — I700 Atherosclerosis of aorta: Secondary | ICD-10-CM | POA: Diagnosis not present

## 2024-07-21 DIAGNOSIS — Z9989 Dependence on other enabling machines and devices: Secondary | ICD-10-CM | POA: Diagnosis not present

## 2024-07-21 DIAGNOSIS — Z86718 Personal history of other venous thrombosis and embolism: Secondary | ICD-10-CM | POA: Diagnosis not present

## 2024-07-21 DIAGNOSIS — J439 Emphysema, unspecified: Secondary | ICD-10-CM | POA: Diagnosis not present

## 2024-07-21 DIAGNOSIS — Z681 Body mass index (BMI) 19 or less, adult: Secondary | ICD-10-CM | POA: Diagnosis not present

## 2024-07-21 DIAGNOSIS — I361 Nonrheumatic tricuspid (valve) insufficiency: Secondary | ICD-10-CM | POA: Diagnosis not present

## 2024-07-23 DIAGNOSIS — M25562 Pain in left knee: Secondary | ICD-10-CM | POA: Diagnosis not present

## 2024-07-23 DIAGNOSIS — Z96652 Presence of left artificial knee joint: Secondary | ICD-10-CM | POA: Diagnosis not present

## 2024-07-26 DIAGNOSIS — Z96652 Presence of left artificial knee joint: Secondary | ICD-10-CM | POA: Diagnosis not present

## 2024-07-26 DIAGNOSIS — M25562 Pain in left knee: Secondary | ICD-10-CM | POA: Diagnosis not present

## 2024-07-28 DIAGNOSIS — M25562 Pain in left knee: Secondary | ICD-10-CM | POA: Diagnosis not present

## 2024-07-28 DIAGNOSIS — Z96652 Presence of left artificial knee joint: Secondary | ICD-10-CM | POA: Diagnosis not present

## 2024-07-30 ENCOUNTER — Other Ambulatory Visit: Payer: Self-pay | Admitting: Internal Medicine

## 2024-07-30 DIAGNOSIS — M25562 Pain in left knee: Secondary | ICD-10-CM | POA: Diagnosis not present

## 2024-07-30 DIAGNOSIS — Z96652 Presence of left artificial knee joint: Secondary | ICD-10-CM | POA: Diagnosis not present

## 2024-08-02 ENCOUNTER — Ambulatory Visit
Admission: RE | Admit: 2024-08-02 | Discharge: 2024-08-02 | Disposition: A | Discharge status: Home / Self Care | Source: Ambulatory Visit | Attending: Surgery | Admitting: Surgery

## 2024-08-02 ENCOUNTER — Encounter: Payer: Self-pay | Admitting: Surgery

## 2024-08-02 ENCOUNTER — Other Ambulatory Visit: Payer: Self-pay | Admitting: Surgery

## 2024-08-02 DIAGNOSIS — M7989 Other specified soft tissue disorders: Secondary | ICD-10-CM | POA: Diagnosis not present

## 2024-08-02 DIAGNOSIS — Z96652 Presence of left artificial knee joint: Secondary | ICD-10-CM | POA: Diagnosis not present

## 2024-08-02 DIAGNOSIS — M25562 Pain in left knee: Secondary | ICD-10-CM | POA: Diagnosis not present

## 2024-08-04 DIAGNOSIS — Z96652 Presence of left artificial knee joint: Secondary | ICD-10-CM | POA: Diagnosis not present

## 2024-08-04 DIAGNOSIS — Z471 Aftercare following joint replacement surgery: Secondary | ICD-10-CM | POA: Diagnosis not present

## 2024-08-04 DIAGNOSIS — M25562 Pain in left knee: Secondary | ICD-10-CM | POA: Diagnosis not present

## 2024-08-09 DIAGNOSIS — M25562 Pain in left knee: Secondary | ICD-10-CM | POA: Diagnosis not present

## 2024-08-09 DIAGNOSIS — Z96652 Presence of left artificial knee joint: Secondary | ICD-10-CM | POA: Diagnosis not present

## 2024-08-11 DIAGNOSIS — Z96652 Presence of left artificial knee joint: Secondary | ICD-10-CM | POA: Diagnosis not present

## 2024-08-11 DIAGNOSIS — M25562 Pain in left knee: Secondary | ICD-10-CM | POA: Diagnosis not present

## 2024-08-13 DIAGNOSIS — Z96652 Presence of left artificial knee joint: Secondary | ICD-10-CM | POA: Diagnosis not present

## 2024-08-13 DIAGNOSIS — M25562 Pain in left knee: Secondary | ICD-10-CM | POA: Diagnosis not present

## 2024-08-20 DIAGNOSIS — Z96652 Presence of left artificial knee joint: Secondary | ICD-10-CM | POA: Diagnosis not present

## 2024-08-20 DIAGNOSIS — M25562 Pain in left knee: Secondary | ICD-10-CM | POA: Diagnosis not present

## 2024-08-24 DIAGNOSIS — Z96652 Presence of left artificial knee joint: Secondary | ICD-10-CM | POA: Diagnosis not present

## 2024-08-24 DIAGNOSIS — M25562 Pain in left knee: Secondary | ICD-10-CM | POA: Diagnosis not present

## 2024-08-26 ENCOUNTER — Other Ambulatory Visit: Payer: Self-pay | Admitting: Internal Medicine

## 2024-08-26 DIAGNOSIS — Z96652 Presence of left artificial knee joint: Secondary | ICD-10-CM | POA: Diagnosis not present

## 2024-08-26 DIAGNOSIS — M25562 Pain in left knee: Secondary | ICD-10-CM | POA: Diagnosis not present

## 2024-09-06 DIAGNOSIS — Z96652 Presence of left artificial knee joint: Secondary | ICD-10-CM | POA: Diagnosis not present

## 2024-09-06 DIAGNOSIS — M25562 Pain in left knee: Secondary | ICD-10-CM | POA: Diagnosis not present

## 2024-09-08 DIAGNOSIS — R197 Diarrhea, unspecified: Secondary | ICD-10-CM | POA: Diagnosis not present

## 2024-09-08 DIAGNOSIS — M25562 Pain in left knee: Secondary | ICD-10-CM | POA: Diagnosis not present

## 2024-09-08 DIAGNOSIS — R1084 Generalized abdominal pain: Secondary | ICD-10-CM | POA: Diagnosis not present

## 2024-09-08 DIAGNOSIS — Z96652 Presence of left artificial knee joint: Secondary | ICD-10-CM | POA: Diagnosis not present

## 2024-09-14 ENCOUNTER — Ambulatory Visit (INDEPENDENT_AMBULATORY_CARE_PROVIDER_SITE_OTHER): Admitting: Internal Medicine

## 2024-09-14 ENCOUNTER — Encounter: Payer: Self-pay | Admitting: Internal Medicine

## 2024-09-14 VITALS — BP 126/56 | HR 104 | Ht 63.0 in | Wt 80.2 lb

## 2024-09-14 DIAGNOSIS — R7303 Prediabetes: Secondary | ICD-10-CM | POA: Diagnosis not present

## 2024-09-14 DIAGNOSIS — L659 Nonscarring hair loss, unspecified: Secondary | ICD-10-CM

## 2024-09-14 DIAGNOSIS — M818 Other osteoporosis without current pathological fracture: Secondary | ICD-10-CM

## 2024-09-14 DIAGNOSIS — R636 Underweight: Secondary | ICD-10-CM | POA: Diagnosis not present

## 2024-09-14 DIAGNOSIS — K529 Noninfective gastroenteritis and colitis, unspecified: Secondary | ICD-10-CM

## 2024-09-14 DIAGNOSIS — M1712 Unilateral primary osteoarthritis, left knee: Secondary | ICD-10-CM

## 2024-09-14 DIAGNOSIS — R5383 Other fatigue: Secondary | ICD-10-CM

## 2024-09-14 DIAGNOSIS — T380X5A Adverse effect of glucocorticoids and synthetic analogues, initial encounter: Secondary | ICD-10-CM | POA: Diagnosis not present

## 2024-09-14 DIAGNOSIS — G8929 Other chronic pain: Secondary | ICD-10-CM

## 2024-09-14 DIAGNOSIS — R140 Abdominal distension (gaseous): Secondary | ICD-10-CM

## 2024-09-14 DIAGNOSIS — D7589 Other specified diseases of blood and blood-forming organs: Secondary | ICD-10-CM

## 2024-09-14 DIAGNOSIS — E559 Vitamin D deficiency, unspecified: Secondary | ICD-10-CM

## 2024-09-14 DIAGNOSIS — E441 Mild protein-calorie malnutrition: Secondary | ICD-10-CM

## 2024-09-14 DIAGNOSIS — E538 Deficiency of other specified B group vitamins: Secondary | ICD-10-CM | POA: Diagnosis not present

## 2024-09-14 DIAGNOSIS — K2100 Gastro-esophageal reflux disease with esophagitis, without bleeding: Secondary | ICD-10-CM

## 2024-09-14 DIAGNOSIS — Z23 Encounter for immunization: Secondary | ICD-10-CM | POA: Diagnosis not present

## 2024-09-14 DIAGNOSIS — E785 Hyperlipidemia, unspecified: Secondary | ICD-10-CM | POA: Diagnosis not present

## 2024-09-14 MED ORDER — ROSUVASTATIN CALCIUM 10 MG PO TABS: 10.0000 mg | ORAL_TABLET | Freq: Every day | ORAL | 1 refills | Status: AC

## 2024-09-14 MED ORDER — OMEPRAZOLE 40 MG PO CPDR
40.0000 mg | DELAYED_RELEASE_CAPSULE | Freq: Two times a day (BID) | ORAL | 1 refills | Status: AC
Start: 2024-09-14 — End: ?

## 2024-09-14 MED ORDER — TRAZODONE HCL 100 MG PO TABS: 100.0000 mg | ORAL_TABLET | Freq: Every day | ORAL | 1 refills | Status: DC

## 2024-09-14 MED ORDER — MINOXIDIL 2.5 MG PO TABS: 2.5000 mg | ORAL_TABLET | Freq: Every day | ORAL | 2 refills | Status: DC

## 2024-09-14 NOTE — Patient Instructions (Signed)
 I have ordered a gastric emptying study to see if your stomach Is emptying the way it should  I will make a referral to  GI

## 2024-09-14 NOTE — Progress Notes (Unsigned)
 Subjective:  Patient ID: Tamara [name], female    DOB: 1946-08-31  Age: 78 y.o. MRN: [id number]  CC: The primary encounter diagnosis was Prediabetes. Diagnoses of Hyperlipidemia, unspecified hyperlipidemia type, Other fatigue, Abdominal bloating, Mild protein-calorie malnutrition, Gastroesophageal reflux disease with esophagitis without hemorrhage, Chronic diarrhea, Need for influenza vaccination, Chronic abdominal pain, Primary osteoarthritis of left knee, Underweight, Macrocytosis without anemia, B12 deficiency, Steroid-induced osteoporosis, Vitamin D  deficiency, and Thinning hair were also pertinent to this visit.   HPI SACHI BOULAY presents for  Chief Complaint  Patient presents with   Medical Management of Chronic Issues    6 month follow up     Tamara [name] is a 78 yr old female with a history of Crohn's disease , in remission,  chronic abdominal pain and GERD,  tobacco abuse and GAD who  presents today for medication management of anxiety   She is frustrated today because of her continued abdominal symptoms despite evaluation by Dr Francis Boss.  She was last seen by GI  Sep 08 2024.  Prior to that her last GI visit was Oct 03 2023    H/o she has a Crohn's  with a  remote  h/o  laparotomy for SBO,   hiatal hernia s/p Nissen funduplication with subsequent release/reversal  who reports that her colitis has been quiescent for years; however she has been unable to maintain a healthy weight  due to chronic GI complaints.  .  She reports abd bloating  occurring after every meal.  Has chronic diarrhea and averages 2 liquid stools daily , with some episodes occurring during sleep.  No change with dietary restrictions.  Has  occasional nausea and GERD symptoms occurring daily.   Has been seeing [facility],  recent  plain films normal except for increased stool burden.   Took an enema as directed  which temporarily relieved the bloating and the constipation  but symptoms have returned.  Last  colonoscopy 2020  , no active disease CT abd and pelvis 2024:  Stable postsurgical changes within the stomach and small bowel  with unchanged mild small bowel distension around the anastomosis. There are several areas of long segment circumferential wall thickening and incomplete distension of the colon near the hepatic flexure, mid transverse colon and descending-sigmoid junction which are nonspecific and could be secondary to incomplete distension or  colonic inflammation Was given rifaximin at some point  last year  with no effect .    Has tried stool softeners,  Laxatives .  Testing for c dif colitis was ordered by [facility]  and is currently pending  given her use of abx in February 2025 following a postoperative infection of knee prothesis.  . She is not sure what the plan if the c dif test is negative .  She is requesting a second opinion from [facility]   3) h/o left knee replacement   July 08 2024  still receiving PT  4) GAD:  she reports that her symptoms are stable.  Review of meds notes use of trazodone , clonazepam  , sertraline  and nortriptyline .   Outpatient Medications Prior to Visit  Medication Sig Dispense Refill   acetaminophen  (TYLENOL ) 500 MG tablet Take 500-1,000 mg by mouth every 6 (six) hours as needed (pain.).     Biotin w/ Vitamins C & E (HAIR SKIN & NAILS GUMMIES PO) Take 2 each by mouth in the morning.     clonazePAM  (KLONOPIN ) 0.5 MG tablet TAKE 1 TABLET BY MOUTH ONCE DAILY  AS NEEDED FOR ANXIETY 30 tablet 2   ondansetron  (ZOFRAN -ODT) 4 MG disintegrating tablet Take 1 tablet (4 mg total) by mouth every 8 (eight) hours as needed for nausea or vomiting. 20 tablet 1   sertraline  (ZOLOFT ) 100 MG tablet TAKE 1 TABLET BY MOUTH ONCE DAILY 90 tablet 1   nortriptyline  (PAMELOR ) 25 MG capsule TAKE 1 CAPSULE BY MOUTH AT BEDTIME 90 capsule 0   omeprazole (PRILOSEC) 40 MG capsule TAKE 1 CAPSULE BY MOUTH TWICE DAILY 180 capsule 1   rivaroxaban  (XARELTO ) 10 MG TABS tablet Take 1 tablet (10  mg total) by mouth daily. 15 tablet 0   rosuvastatin  (CRESTOR ) 10 MG tablet Take 1 tablet (10 mg total) by mouth at bedtime. 90 tablet 1   traZODone  (DESYREL ) 100 MG tablet TAKE 1 TABLET BY MOUTH AT BEDTIME 90 tablet 0   HYDROcodone -acetaminophen  (NORCO/VICODIN) 5-325 MG tablet Take 1-2 tablets by mouth every 6 (six) hours as needed for moderate pain (pain score 4-6) or severe pain (pain score 7-10). (Patient not taking: Reported on 09/14/2024) 40 tablet 0   No facility-administered medications prior to visit.    Review of Systems;  Patient denies headache, fevers, malaise, unintentional weight loss, skin rash, eye pain, sinus congestion and sinus pain, sore throat, dysphagia,  hemoptysis , cough, dyspnea, wheezing, chest pain, palpitations, orthopnea, edema, abdominal pain, nausea, melena, diarrhea, constipation, flank pain, dysuria, hematuria, urinary  Frequency, nocturia, numbness, tingling, seizures,  Focal weakness, Loss of consciousness,  Tremor, insomnia, depression, anxiety, and suicidal ideation.      Objective:  BP (!) 126/56   Pulse (!) 104   Ht 5' 3 (1.6 m)   Wt 80 lb 3.2 oz (36.4 kg)   SpO2 99%   BMI 14.21 kg/m   BP Readings from Last 3 Encounters:  09/14/24 (!) 126/56  07/08/24 109/61  07/01/24 121/68    Wt Readings from Last 3 Encounters:  09/14/24 80 lb 3.2 oz (36.4 kg)  07/08/24 82 lb (37.2 kg)  07/01/24 82 lb (37.2 kg)    Physical Exam Vitals reviewed.  Constitutional:      General: She is not in acute distress.    Appearance: Normal appearance. She is normal weight. She is not ill-appearing, toxic-appearing or diaphoretic.  HENT:     Head: Normocephalic.  Eyes:     General: No scleral icterus.       Right eye: No discharge.        Left eye: No discharge.     Conjunctiva/sclera: Conjunctivae normal.  Cardiovascular:     Rate and Rhythm: Normal rate and regular rhythm.     Heart sounds: Normal heart sounds.  Pulmonary:     Effort: Pulmonary effort  is normal. No respiratory distress.     Breath sounds: Normal breath sounds.  Musculoskeletal:        General: Normal range of motion.  Skin:    General: Skin is warm and dry.  Neurological:     General: No focal deficit present.     Mental Status: She is alert and oriented to person, place, and time. Mental status is at baseline.  Psychiatric:        Mood and Affect: Mood normal.        Behavior: Behavior normal.        Thought Content: Thought content normal.        Judgment: Judgment normal.     Lab Results  Component Value Date   HGBA1C 5.8 09/16/2024   HGBA1C  6.1 06/03/2023   HGBA1C 6.3 09/12/2022    Lab Results  Component Value Date   CREATININE 0.74 09/16/2024   CREATININE 0.76 07/01/2024   CREATININE 0.60 04/01/2024    Lab Results  Component Value Date   WBC 7.2 09/16/2024   HGB 13.0 09/16/2024   HCT 39.2 09/16/2024   PLT 250.0 09/16/2024   GLUCOSE 150 (H) 09/16/2024   CHOL 128 09/16/2024   TRIG 98.0 09/16/2024   HDL 69.40 09/16/2024   LDLDIRECT 37.0 09/16/2024   LDLCALC 39 09/16/2024   ALT 22 09/16/2024   AST 21 09/16/2024   NA 140 09/16/2024   K 3.7 09/16/2024   CL 108 09/16/2024   CREATININE 0.74 09/16/2024   BUN 13 09/16/2024   CO2 25 09/16/2024   TSH 2.02 09/16/2024   INR 1.0 04/19/2022   HGBA1C 5.8 09/16/2024    US  Venous Img Lower Unilateral Left (DVT) Addendum Date: 08/02/2024 ADDENDUM REPORT: 08/02/2024 14:32 ADDENDUM: Minimal eccentric wall thickening in the contralateral right common femoral vein likely reflecting sequelae of remote chronic DVT. Electronically Signed   By: Wilkie Lent M.D.   On: 08/02/2024 14:32   Result Date: 08/02/2024 CLINICAL DATA:  Left knee pain and swelling. History of knee replacement 07/08/2024. EXAM: LEFT LOWER EXTREMITY VENOUS DOPPLER ULTRASOUND TECHNIQUE: Gray-scale sonography with compression, as well as color and duplex ultrasound, were performed to evaluate the deep venous system(s) from the level of  the common femoral vein through the popliteal and proximal calf veins. COMPARISON:  None Available. FINDINGS: VENOUS Normal compressibility of the common femoral, superficial femoral, and popliteal veins, as well as the visualized calf veins. Visualized portions of profunda femoral vein and great saphenous vein unremarkable. No filling defects to suggest DVT on grayscale or color Doppler imaging. Doppler waveforms show normal direction of venous flow, normal respiratory plasticity and response to augmentation. Limited views of the contralateral common femoral vein are unremarkable. OTHER None. Limitations: none IMPRESSION: Negative. Electronically Signed: By: Wilkie Lent M.D. On: 08/02/2024 13:50    Assessment & Plan:  .Prediabetes -     Comprehensive metabolic panel with GFR; Future -     Hemoglobin A1c; Future  Hyperlipidemia, unspecified hyperlipidemia type -     LDL cholesterol, direct; Future -     Lipid panel; Future  Other fatigue -     CBC with Differential/Platelet; Future -     TSH; Future  Abdominal bloating -     NM GASTRIC EMPTYING; Future -     Ambulatory referral to Gastroenterology  Mild protein-calorie malnutrition -     Ambulatory referral to Gastroenterology -     IBC + Ferritin  Gastroesophageal reflux disease with esophagitis without hemorrhage -     Ambulatory referral to Gastroenterology  Chronic diarrhea -     Ambulatory referral to Gastroenterology  Need for influenza vaccination -     Flu vaccine HIGH DOSE PF(Fluzone Trivalent)  Chronic abdominal pain Assessment & Plan: Etiology likely multifactorial and includes IBC/constipation/diarrhea , adhesions fron prior surgery,,  need to rule out mesenteric ischemia and gastroparesis.  Will order gastric emptying study and refer to vascular for evaluation of mesenteric circulation if negative.  Second opinion requested from patient; currently seeing keith toledo but feels she is being brushed off;  referral  to [facility] made    Primary osteoarthritis of left knee Assessment & Plan: S/p TKR  sept 12 2025 by Poggi ;  doing well in rehab   Underweight Assessment & Plan: Secondary  to chronic abd pain  now with hair loss.     Macrocytosis without anemia -     B12 and Folate Panel; Future  B12 deficiency Assessment & Plan: Secondary to Crohn's of the small intestine. She has been advised to continue self administering monthly injections  but has not had a level checked since 2024   Lab Results  Component Value Date   VITAMINB12 649 06/03/2023      Steroid-induced osteoporosis Assessment & Plan: Managed by to [facility] , last Reclast  infusion in Feb 2024  without side effects.  Not taking any calcium  supplements.  Taking vitamin D . Prescribed weekly by [facility].    BMD is increasing by 2022 DEXA ,   Vitamin D  deficiency -     VITAMIN D  25 Hydroxy (Vit-D Deficiency, Fractures); Future  Thinning hair Assessment & Plan: Likely due to weight loss and chronic malnutrition .  Adding minoxidil low dose    Other orders -     Omeprazole; Take 1 capsule (40 mg total) by mouth 2 (two) times daily.  Dispense: 180 capsule; Refill: 1 -     Rosuvastatin  Calcium ; Take 1 tablet (10 mg total) by mouth at bedtime.  Dispense: 90 tablet; Refill: 1 -     traZODone  HCl; Take 1 tablet (100 mg total) by mouth at bedtime.  Dispense: 90 tablet; Refill: 1 -     Minoxidil; Take 1 tablet (2.5 mg total) by mouth daily.  Dispense: 30 tablet; Refill: 2    I personally spent a total of 45 minutes in the care of the patient today including preparing to see the patient, getting/reviewing separately obtained history, performing a medically appropriate exam/evaluation, counseling and educating, placing orders, documenting clinical information in the EHR, independently interpreting results, and communicating results.   Follow-up: No follow-ups on file.   Verneita LITTIE Kettering, MD

## 2024-09-15 ENCOUNTER — Encounter: Payer: Self-pay | Admitting: Internal Medicine

## 2024-09-16 ENCOUNTER — Encounter: Payer: Self-pay | Admitting: Internal Medicine

## 2024-09-16 ENCOUNTER — Other Ambulatory Visit (INDEPENDENT_AMBULATORY_CARE_PROVIDER_SITE_OTHER)

## 2024-09-16 ENCOUNTER — Ambulatory Visit: Payer: Self-pay | Admitting: Internal Medicine

## 2024-09-16 DIAGNOSIS — R5383 Other fatigue: Secondary | ICD-10-CM | POA: Diagnosis not present

## 2024-09-16 DIAGNOSIS — R7303 Prediabetes: Secondary | ICD-10-CM

## 2024-09-16 DIAGNOSIS — E785 Hyperlipidemia, unspecified: Secondary | ICD-10-CM

## 2024-09-16 DIAGNOSIS — L659 Nonscarring hair loss, unspecified: Secondary | ICD-10-CM | POA: Insufficient documentation

## 2024-09-16 LAB — LIPID PANEL
Cholesterol: 128 mg/dL (ref 0–200)
HDL: 69.4 mg/dL (ref >39.00)
LDL Cholesterol: 39 mg/dL (ref 0–99)
NonHDL: 58.29
Total CHOL/HDL Ratio: 2
Triglycerides: 98 mg/dL (ref 0.0–149.0)
VLDL: 19.6 mg/dL (ref 0.0–40.0)

## 2024-09-16 LAB — CBC WITH DIFFERENTIAL/PLATELET
Basophils Absolute: 0 K/uL (ref 0.0–0.1)
Basophils Relative: 0.5 % (ref 0.0–3.0)
Eosinophils Absolute: 0.1 K/uL (ref 0.0–0.7)
Eosinophils Relative: 0.8 % (ref 0.0–5.0)
HCT: 39.2 % (ref 36.0–46.0)
Hemoglobin: 13 g/dL (ref 12.0–15.0)
Lymphocytes Relative: 28.9 % (ref 12.0–46.0)
Lymphs Abs: 2.1 K/uL (ref 0.7–4.0)
MCHC: 33.3 g/dL (ref 30.0–36.0)
MCV: 104.6 fl — ABNORMAL HIGH (ref 78.0–100.0)
Monocytes Absolute: 0.7 K/uL (ref 0.1–1.0)
Monocytes Relative: 10.1 % (ref 3.0–12.0)
Neutro Abs: 4.3 K/uL (ref 1.4–7.7)
Neutrophils Relative %: 59.7 % (ref 43.0–77.0)
Platelets: 250 K/uL (ref 150.0–400.0)
RBC: 3.75 Mil/uL — ABNORMAL LOW (ref 3.87–5.11)
RDW: 14 % (ref 11.5–15.5)
WBC: 7.2 K/uL (ref 4.0–10.5)

## 2024-09-16 LAB — COMPREHENSIVE METABOLIC PANEL WITH GFR
ALT: 22 U/L (ref 0–35)
AST: 21 U/L (ref 0–37)
Albumin: 4.1 g/dL (ref 3.5–5.2)
Alkaline Phosphatase: 54 U/L (ref 39–117)
BUN: 13 mg/dL (ref 6–23)
CO2: 25 meq/L (ref 19–32)
Calcium: 8.7 mg/dL (ref 8.4–10.5)
Chloride: 108 meq/L (ref 96–112)
Creatinine, Ser: 0.74 mg/dL (ref 0.40–1.20)
GFR: 78.34 mL/min (ref >60.00)
Glucose, Bld: 150 mg/dL — ABNORMAL HIGH (ref 70–99)
Potassium: 3.7 meq/L (ref 3.5–5.1)
Sodium: 140 meq/L (ref 135–145)
Total Bilirubin: 0.3 mg/dL (ref 0.2–1.2)
Total Protein: 5.8 g/dL — ABNORMAL LOW (ref 6.0–8.3)

## 2024-09-16 LAB — HEMOGLOBIN A1C: Hgb A1c MFr Bld: 5.8 % (ref 4.6–6.5)

## 2024-09-16 LAB — TSH: TSH: 2.02 u[IU]/mL (ref 0.35–5.50)

## 2024-09-16 LAB — LDL CHOLESTEROL, DIRECT: Direct LDL: 37 mg/dL

## 2024-09-16 NOTE — Assessment & Plan Note (Signed)
 Managed by to [facility] , last Reclast  infusion in Feb 2024  without side effects.  Not taking any calcium  supplements.  Taking vitamin D . Prescribed weekly by [facility].    BMD is increasing by 2022 DEXA ,

## 2024-09-16 NOTE — Assessment & Plan Note (Signed)
 Secondary to Crohn's of the small intestine. She has been advised to continue self administering monthly injections  but has not had a level checked since 2024   Lab Results  Component Value Date   VITAMINB12 649 06/03/2023

## 2024-09-16 NOTE — Assessment & Plan Note (Addendum)
 S/p TKR  sept 12 2025 by Poggi ;  doing well in rehab

## 2024-09-16 NOTE — Assessment & Plan Note (Signed)
 Etiology likely multifactorial and includes IBC/constipation/diarrhea , adhesions fron prior surgery,,  need to rule out mesenteric ischemia and gastroparesis.  Will order gastric emptying study and refer to vascular for evaluation of mesenteric circulation if negative.  Second opinion requested from patient; currently seeing keith toledo but feels she is being brushed off;  referral to [facility] made

## 2024-09-16 NOTE — Assessment & Plan Note (Signed)
 Secondary to chronic abd pain  now with hair loss.

## 2024-09-16 NOTE — Assessment & Plan Note (Signed)
 Likely due to weight loss and chronic malnutrition .  Adding minoxidil low dose

## 2024-09-17 DIAGNOSIS — M25562 Pain in left knee: Secondary | ICD-10-CM | POA: Diagnosis not present

## 2024-09-17 DIAGNOSIS — Z96652 Presence of left artificial knee joint: Secondary | ICD-10-CM | POA: Diagnosis not present

## 2024-09-17 NOTE — Progress Notes (Signed)
 Pt notified and reviewed via MyChart. Last read by Tilton JONELLE Lindau at 7:38AM on 09/17/2024.

## 2024-09-22 DIAGNOSIS — M25562 Pain in left knee: Secondary | ICD-10-CM | POA: Diagnosis not present

## 2024-09-22 DIAGNOSIS — Z96652 Presence of left artificial knee joint: Secondary | ICD-10-CM | POA: Diagnosis not present

## 2024-10-01 ENCOUNTER — Ambulatory Visit

## 2024-10-04 ENCOUNTER — Ambulatory Visit
Admission: EM | Admit: 2024-10-04 | Discharge: 2024-10-04 | Disposition: A | Discharge status: Home / Self Care | Attending: Emergency Medicine | Admitting: Emergency Medicine

## 2024-10-04 ENCOUNTER — Encounter: Payer: Self-pay | Admitting: Emergency Medicine

## 2024-10-04 DIAGNOSIS — J069 Acute upper respiratory infection, unspecified: Secondary | ICD-10-CM | POA: Diagnosis not present

## 2024-10-04 MED ORDER — AMOXICILLIN-POT CLAVULANATE 875-125 MG PO TABS: 1.0000 | ORAL_TABLET | Freq: Two times a day (BID) | ORAL | 0 refills | Status: AC

## 2024-10-04 MED ORDER — IPRATROPIUM BROMIDE 0.06 % NA SOLN: 2.0000 | Freq: Four times a day (QID) | NASAL | 12 refills | Status: AC

## 2024-10-04 MED ORDER — PROMETHAZINE-DM 6.25-15 MG/5ML PO SYRP: 5.0000 mL | ORAL_SOLUTION | Freq: Four times a day (QID) | ORAL | 0 refills | Status: AC | PRN

## 2024-10-04 MED ORDER — BENZONATATE 100 MG PO CAPS: 200.0000 mg | ORAL_CAPSULE | Freq: Three times a day (TID) | ORAL | 0 refills | Status: AC

## 2024-10-04 NOTE — Discharge Instructions (Addendum)
 Take the Augmentin  twice daily with food for 7 days for treatment of your upper respiratory tract infection.  Use over-the-counter Tylenol  according to the package instructions for any body aches or fever.  Use the Atrovent  nasal spray, 2 squirts in each nostril every 6 hours, as needed for runny nose and postnasal drip.  Use the Tessalon  Perles every 8 hours during the day.  Take them with a small sip of water.  They may give you some numbness to the base of your tongue or a metallic taste in your mouth, this is normal.  Use the Promethazine  DM cough syrup at bedtime for cough and congestion.  It will make you drowsy so do not take it during the day.  Return for reevaluation or see your primary care provider for any new or worsening symptoms.

## 2024-10-04 NOTE — ED Triage Notes (Signed)
 Pt c/o non productive cough, chest congestion and dizziness x 1 week. Pt has taken OTC cold medication for her symptoms.

## 2024-10-04 NOTE — ED Provider Notes (Signed)
 [facility]    CSN: [id number] Arrival date & time: 10/04/24  1040      History   Chief Complaint Chief Complaint  Patient presents with   Cough   Dizziness    HPI Tamara [name] is a 78 y.o. female.   HPI  78 year old female with past medical history significant for chronic abdominal pain, history of DVT, steroid-induced osteoporosis, GERD, Crohn's disease, COPD presents for evaluation of 1 week worth of respiratory symptoms that include runny nose and nasal congestion for Klindt nasal discharge, intermittent fevers up to 101, and a nonproductive cough.  She denies any ear pain, shortness breath, or wheezing.  She has had some intermittent dizziness with change of positions.  She has been using over-the-counter NyQuil, DayQuil, Mucinex  without relief of symptoms.  Past Medical History:  Diagnosis Date   Acute deep vein thrombosis (DVT) of left lower extremity (HCC) 05/2021   Acute deep vein thrombosis (DVT) of right lower extremity (HCC) 01/2007   a.) occurred postoperatively; s/p IVC filter placement   Acute deep vein thrombosis (DVT) of right lower extremity (HCC) 04/24/2018   Amaurosis fugax 12/13/2014    Brain MRA suggested vertebral artery partial occlusion   75 to 90% stenosed. Will treat for prevention of embolic stroke,  Refer to [facility] for CTA and stent and refer to cardiology for ECHO    Anxiety    a.) on BZO (clonazepam ) PRN   Aortic atherosclerosis    Arthritis    B12 deficiency    Carpal tunnel syndrome 03/08/2014   Cataracts, bilateral    Chronic migraine without aura, with intractable migraine, so stated, with status migrainosus    Clostridium difficile infection 05/24/2021   COPD (chronic obstructive pulmonary disease) (HCC)    Crohn's disease (HCC)    Depression    Dog bite 12/06/2021   DVT (deep venous thrombosis) (HCC) 04/24/2018   Dysphagia 12/04/2017   Facial twitching 07/11/2018   GERD (gastroesophageal reflux  disease)    Hematochezia    History of cardiac catheterization 06/14/1997   a.) LHC 06/14/1997 at [facility]: EF 60%; normal coronaries; no obstructive CAD.   History of hiatal hernia    History of kidney stones    Hx of splenectomy    a.) reports spleen was ruptured during routine colonoscopy in [place] -- necessitated surgical resection   Intractable chronic migraine without aura and without status migrainosus 05/20/2019   Leukocytosis    Moderate tricuspid insufficiency    MRSA colonization 12/2016   Osteoporosis    Pneumonia    Primary osteoarthritis of left knee    Reflux esophagitis 01/24/2018   Small bowel obstruction (HCC)    a.) 2001, 2004 (reversal of jejunal bypass). b.) s/p laparotomy and lysis of adhesions 01/2007   Small bowel obstruction (HCC) 2001   Spinal stenosis of lumbar region    TIA (transient ischemic attack)    Vitamin D  deficiency     Patient Active Problem List   Diagnosis Date Noted   Thinning hair 09/16/2024   Seasonal allergic rhinitis due to pollen 03/16/2024   Chronic obstructive pulmonary disease (HCC) 03/16/2024   Mammogram declined 06/03/2023   S/P spinal fusion 04/01/2022   Generalized anxiety disorder 05/06/2021   Cerebrovascular disease 07/31/2020   Aortic atherosclerosis 07/19/2020   Coronary atherosclerosis due to calcified coronary lesion 07/19/2020   Emphysema with chronic bronchitis (HCC) 01/28/2020   OSA (obstructive sleep apnea) 08/01/2019   GERD (gastroesophageal reflux disease) 04/28/2019  Insomnia due to anxiety and fear 07/11/2018   History of repair of laceration 04/28/2018   DVT (deep venous thrombosis) (HCC) 04/24/2018   Malnutrition 02/20/2018   Macrocytosis 12/04/2017   Primary osteoarthritis of left knee 06/24/2017   Spinal stenosis, lumbar region, with neurogenic claudication 11/08/2016   Polycythemia, secondary 10/24/2016   Medicare annual wellness visit, subsequent 04/16/2016   Underweight 08/27/2015   Vitamin D   deficiency 08/27/2015   Uric acid arthropathy 10/08/2014   B12 deficiency 01/26/2014   Encounter for preventive health examination 05/11/2013   Steroid-induced osteoporosis 11/19/2011   Chronic abdominal pain    H/O small bowel obstruction    Crohn's disease of intestine, with intestinal obstruction (HCC)    History of DVT (deep vein thrombosis) 01/10/2007    Past Surgical History:  Procedure Laterality Date   ANAL SPHINCTER PROSTHESIS PLACEMENT     APPENDECTOMY     APPLICATION OF WOUND VAC  01/13/2024   Procedure: APPLICATION, WOUND VAC;  Surgeon: Edie Norleen PARAS, MD;  Location: [facility];  Service: Orthopedics;;   BACK SURGERY     spinal fusion   CARDIAC CATHETERIZATION Left 06/14/1997   Procedure: CARDIAC CATHETERIZATION; Location: [facility]; Surgeon: Lynwood Falls, MD   CARPAL TUNNEL RELEASE Right 04/22/2014   CARPAL TUNNEL RELEASE Left    CATARACT EXTRACTION Bilateral    CERVICAL FUSION  2005   C6-7, anterior approach   CHOLECYSTECTOMY     COLONOSCOPY     2005, 2007, 2010, 2015, 2020, 2021   COLONOSCOPY WITH PROPOFOL  N/A 05/10/2019   Procedure: COLONOSCOPY WITH PROPOFOL ;  Surgeon: Viktoria Lamar DASEN, MD;  Location: [facility];  Service: Endoscopy;  Laterality: N/A;   ESOPHAGEAL MANOMETRY N/A 02/11/2018   Procedure: ESOPHAGEAL MANOMETRY (EM);  Surgeon: Jinny Carmine, MD;  Location: [facility];  Service: Endoscopy;  Laterality: N/A;   ESOPHAGOGASTRODUODENOSCOPY     1996, 2006, 2008, 2010, 2014, 2018, 2021   ESOPHAGOGASTRODUODENOSCOPY (EGD) WITH PROPOFOL  N/A 12/17/2017   Procedure: ESOPHAGOGASTRODUODENOSCOPY (EGD) WITH PROPOFOL ;  Surgeon: Dessa Reyes ORN, MD;  Location: [facility];  Service: Endoscopy;  Laterality: N/A;   ESOPHAGOGASTRODUODENOSCOPY (EGD) WITH PROPOFOL  N/A 01/08/2018   Procedure: ESOPHAGOGASTRODUODENOSCOPY (EGD) WITH PROPOFOL ;  Surgeon: Dessa Reyes ORN, MD;  Location: [facility];  Service: Endoscopy;  Laterality: N/A;   EXCISION NEUROMA Left  09/18/2021   Procedure: Excision of traumatic neuroma infrapatellar branch saphenous nerve left knee.;  Surgeon: Edie Norleen PARAS, MD;  Location: [facility];  Service: Orthopedics;  Laterality: Left;   GIVENS CAPSULE STUDY     2005, 2006   I & D EXTREMITY Right 01/13/2024   Procedure: IRRIGATION AND DEBRIDEMENT WITH DELAYED PRIMARY CLOSURE OF RIGHT KNEE WOUND.;  Surgeon: Edie Norleen PARAS, MD;  Location: [facility];  Service: Orthopedics;  Laterality: Right;   INSERTION OF VENA CAVA FILTER Right    KNEE ARTHROSCOPY WITH MEDIAL MENISECTOMY Left 06/22/2020   Procedure: Left knee arthroscopic partial medial meniscectomy;  Surgeon: Tobie Priest, MD;  Location: [facility];  Service: Orthopedics;  Laterality: Left;   KNEE ARTHROSCOPY WITH MEDIAL MENISECTOMY Left 05/25/2021   Procedure: Left knee arthroscopy, infrapatellar fat pad excision;  Surgeon: Tobie Priest, MD;  Location: [facility];  Service: Orthopedics;  Laterality: Left;   LAPAROTOMY  01/30/2007   for bowel obstruction with LOA   LUMBAR LAMINECTOMY/DECOMPRESSION MICRODISCECTOMY N/A 12/25/2016   Procedure: LUMBAR DECOMPRESSION L4-5;  Surgeon: Reeves Daisy, MD;  Location: [facility];  Service: Neurosurgery;  Laterality: N/A;   LUMBAR, FAR LATERAL DISCECTOMY  11/06/2021  L4-5   NISSEN FUNDOPLICATION     ROTATOR CUFF REPAIR Bilateral    SHOULDER ARTHROSCOPY WITH SUBACROMIAL DECOMPRESSION, ROTATOR CUFF REPAIR AND BICEP TENDON REPAIR Left 03/04/2023   Procedure: LEFT SHOULDER ARTHROSCOPY WITH DEBRIDEMENT, DECOMPRESSION, ROATOR CUFF REPAIR, AND POSSIBLE BICEPS TENODESIS. - RNFA;  Surgeon: Edie Norleen PARAS, MD;  Location: [facility];  Service: Orthopedics;  Laterality: Left;   SPLENECTOMY     secondary to colonoscopy   TONSILLECTOMY AND ADENOIDECTOMY     TOTAL KNEE ARTHROPLASTY Right 12/02/2023   Procedure: TOTAL KNEE ARTHROPLASTY;  Surgeon: Edie Norleen PARAS, MD;  Location: [facility];  Service: Orthopedics;  Laterality: Right;   TOTAL KNEE ARTHROPLASTY  Left 07/08/2024   Procedure: ARTHROPLASTY, KNEE, TOTAL;  Surgeon: Edie Norleen PARAS, MD;  Location: [facility];  Service: Orthopedics;  Laterality: Left;   TRANSFORAMINAL LUMBAR INTERBODY FUSION (TLIF) WITH PEDICLE SCREW FIXATION 1 LEVEL N/A 04/01/2022   Procedure: OPEN L4-5 TRANSFORAMINAL LUMBAR INTERBODY FUSION (TLIF);  Surgeon: Clois Fret, MD;  Location: [facility];  Service: Neurosurgery;  Laterality: N/A;   TRIGGER FINGER RELEASE Left 08/25/2015   TRIGGER FINGER RELEASE Right 09/18/2015   VAGINAL HYSTERECTOMY      OB History   No obstetric history on file.      Home Medications    Prior to Admission medications   Medication Sig Start Date End Date Taking? Authorizing Provider  amoxicillin -clavulanate (AUGMENTIN ) 875-125 MG tablet Take 1 tablet by mouth every 12 (twelve) hours for 7 days. 10/04/24 10/11/24 Yes Bernardino Ditch, NP  benzonatate  (TESSALON ) 100 MG capsule Take 2 capsules (200 mg total) by mouth every 8 (eight) hours. 10/04/24  Yes Bernardino Ditch, NP  ipratropium (ATROVENT ) 0.06 % nasal spray Place 2 sprays into both nostrils 4 (four) times daily. 10/04/24  Yes Bernardino Ditch, NP  promethazine -dextromethorphan (PROMETHAZINE -DM) 6.25-15 MG/5ML syrup Take 5 mLs by mouth 4 (four) times daily as needed. 10/04/24  Yes Bernardino Ditch, NP  acetaminophen  (TYLENOL ) 500 MG tablet Take 500-1,000 mg by mouth every 6 (six) hours as needed (pain.).    [provider]  Biotin w/ Vitamins C & E (HAIR SKIN & NAILS GUMMIES PO) Take 2 each by mouth in the morning.    [provider]  clonazePAM  (KLONOPIN ) 0.5 MG tablet TAKE 1 TABLET BY MOUTH ONCE DAILY AS NEEDED FOR ANXIETY 08/27/24   Marylynn Verneita CROME, MD  minoxidil  (LONITEN ) 2.5 MG tablet Take 1 tablet (2.5 mg total) by mouth daily. 09/14/24   Marylynn Verneita CROME, MD  omeprazole  (PRILOSEC) 40 MG capsule Take 1 capsule (40 mg total) by mouth 2 (two) times daily. 09/14/24   Marylynn Verneita CROME, MD  ondansetron  (ZOFRAN -ODT) 4 MG disintegrating  tablet Take 1 tablet (4 mg total) by mouth every 8 (eight) hours as needed for nausea or vomiting. 12/02/23   Poggi, Norleen PARAS, MD  rosuvastatin  (CRESTOR ) 10 MG tablet Take 1 tablet (10 mg total) by mouth at bedtime. 09/14/24   Marylynn Verneita CROME, MD  sertraline  (ZOLOFT ) 100 MG tablet TAKE 1 TABLET BY MOUTH ONCE DAILY 07/31/24   Marylynn Verneita CROME, MD  traZODone  (DESYREL ) 100 MG tablet Take 1 tablet (100 mg total) by mouth at bedtime. 09/14/24   Marylynn Verneita CROME, MD    Family History Family History  Problem Relation Age of Onset   Diabetes Mother        type 2   Heart disease Mother    Hypertension Mother    Coronary artery disease Father    Heart attack  Sister     Social History Social History   Tobacco Use   Smoking status: Every Day    Current packs/day: 1.00    Average packs/day: 1 pack/day for 51.3 years (51.3 ttl pk-yrs)    Types: Cigarettes    Start date: 06/13/2022   Smokeless tobacco: Never  Vaping Use   Vaping status: Never Used  Substance Use Topics   Alcohol use: No    Alcohol/week: 0.0 standard drinks of alcohol   Drug use: No     Allergies   Eliquis [apixaban], Meloxicam , and Sulfa antibiotics   Review of Systems Review of Systems  Constitutional:  Positive for fever.  HENT:  Positive for congestion and rhinorrhea. Negative for ear pain.   Respiratory:  Positive for cough. Negative for shortness of breath and wheezing.      Physical Exam Triage Vital Signs ED Triage Vitals  Encounter Vitals Group     BP      Girls Systolic BP Percentile      Girls Diastolic BP Percentile      Boys Systolic BP Percentile      Boys Diastolic BP Percentile      Pulse      Resp      Temp      Temp src      SpO2      Weight      Height      Head Circumference      Peak Flow      Pain Score      Pain Loc      Pain Education      Exclude from Growth Chart    No data found.  Updated Vital Signs BP 115/66 (BP Location: Right Arm)   Pulse 80   Temp 98 F (36.7 C)  (Oral)   Resp 15   Wt 85 lb (38.6 kg)   SpO2 98%   BMI 15.06 kg/m   Visual Acuity Right Eye Distance:   Left Eye Distance:   Bilateral Distance:    Right Eye Near:   Left Eye Near:    Bilateral Near:     Physical Exam Vitals and nursing note reviewed.  Constitutional:      Appearance: Normal appearance. She is not ill-appearing.  HENT:     Head: Normocephalic and atraumatic.     Right Ear: Tympanic membrane, ear canal and external ear normal. There is no impacted cerumen.     Left Ear: Tympanic membrane, ear canal and external ear normal. There is no impacted cerumen.     Ears:     Comments: Serous effusion present behind left tympanic membrane.  Left TM is pearly gray in appearance.    Nose: Congestion and rhinorrhea present.     Comments: This mucosa is erythematous and edematous with clear discharge in both nares.    Mouth/Throat:     Mouth: Mucous membranes are moist.     Pharynx: Oropharynx is clear. No oropharyngeal exudate or posterior oropharyngeal erythema.  Cardiovascular:     Rate and Rhythm: Normal rate and regular rhythm.     Pulses: Normal pulses.     Heart sounds: Normal heart sounds. No murmur heard.    No friction rub. No gallop.  Pulmonary:     Effort: Pulmonary effort is normal.     Breath sounds: Normal breath sounds. No wheezing, rhonchi or rales.  Musculoskeletal:     Cervical back: Normal range of motion and neck supple. No tenderness.  Lymphadenopathy:     Cervical: No cervical adenopathy.  Skin:    General: Skin is warm and dry.     Capillary Refill: Capillary refill takes less than 2 seconds.     Findings: No erythema or rash.  Neurological:     General: No focal deficit present.     Mental Status: She is alert and oriented to person, place, and time.      UC Treatments / Results  Labs (all labs ordered are listed, but only abnormal results are displayed) Labs Reviewed - No data to display  EKG   Radiology No results  found.  Procedures Procedures (including critical care time)  Medications Ordered in UC Medications - No data to display  Initial Impression / Assessment and Plan / UC Course  I have reviewed the triage vital signs and the nursing notes.  Pertinent labs & imaging results that were available during my care of the patient were reviewed by me and considered in my medical decision making (see chart for details).   Patient is a pleasant, nontoxic-appearing 78 year old female presenting for evaluation of respiratory symptoms as outlined in HPI above.  She is requesting a shot of penicillin and states that that is what they typically give her.  She is complaining of dizziness but it is only intermittent and with changing positions.  She does have a small serous effusion behind her left tympanic membrane which is most likely the cause of the dizziness.  She also has inflammation of her nasal mucosa but her nasal discharge is clear.  Oropharyngeal exam is benign.  Cardiopulmonary exam reveals clear lung sounds in all fields.  Given her history of COPD and the fact that she has had symptoms over a week I do feel a trial of antibiotics is warranted so I will start her on Augmentin  875 mg twice daily with food for a week.  I will prescribe Atrovent  nasal spray to help with the nasal congestion and the serous effusion as well as Tessalon  Perles and Promethazine  DM cough syrup.  Return precautions reviewed.   Final Clinical Impressions(s) / UC Diagnoses   Final diagnoses:  URI with cough and congestion     Discharge Instructions      Take the Augmentin  twice daily with food for 7 days for treatment of your upper respiratory tract infection.  Use over-the-counter Tylenol  according to the package instructions for any body aches or fever.  Use the Atrovent  nasal spray, 2 squirts in each nostril every 6 hours, as needed for runny nose and postnasal drip.  Use the Tessalon  Perles every 8 hours during  the day.  Take them with a small sip of water.  They may give you some numbness to the base of your tongue or a metallic taste in your mouth, this is normal.  Use the Promethazine  DM cough syrup at bedtime for cough and congestion.  It will make you drowsy so do not take it during the day.  Return for reevaluation or see your primary care provider for any new or worsening symptoms.      ED Prescriptions     Medication Sig Dispense Auth. Provider   amoxicillin -clavulanate (AUGMENTIN ) 875-125 MG tablet Take 1 tablet by mouth every 12 (twelve) hours for 7 days. 14 tablet Bernardino Ditch, NP   benzonatate  (TESSALON ) 100 MG capsule Take 2 capsules (200 mg total) by mouth every 8 (eight) hours. 21 capsule Bernardino Ditch, NP   ipratropium (ATROVENT ) 0.06 % nasal spray Place 2  sprays into both nostrils 4 (four) times daily. 15 mL Bernardino Ditch, NP   promethazine -dextromethorphan (PROMETHAZINE -DM) 6.25-15 MG/5ML syrup Take 5 mLs by mouth 4 (four) times daily as needed. 118 mL Bernardino Ditch, NP      PDMP not reviewed this encounter.   Bernardino Ditch, NP 10/04/24 1145

## 2024-10-11 ENCOUNTER — Other Ambulatory Visit: Payer: Self-pay

## 2024-10-11 ENCOUNTER — Emergency Department

## 2024-10-11 ENCOUNTER — Emergency Department
Admission: EM | Admit: 2024-10-11 | Discharge: 2024-10-11 | Disposition: A | Discharge status: Home / Self Care | Attending: Emergency Medicine | Admitting: Emergency Medicine

## 2024-10-11 DIAGNOSIS — Z23 Encounter for immunization: Secondary | ICD-10-CM | POA: Insufficient documentation

## 2024-10-11 DIAGNOSIS — W540XXA Bitten by dog, initial encounter: Secondary | ICD-10-CM | POA: Diagnosis not present

## 2024-10-11 DIAGNOSIS — M19042 Primary osteoarthritis, left hand: Secondary | ICD-10-CM | POA: Diagnosis not present

## 2024-10-11 DIAGNOSIS — J449 Chronic obstructive pulmonary disease, unspecified: Secondary | ICD-10-CM | POA: Diagnosis not present

## 2024-10-11 DIAGNOSIS — S61215A Laceration without foreign body of left ring finger without damage to nail, initial encounter: Secondary | ICD-10-CM | POA: Insufficient documentation

## 2024-10-11 DIAGNOSIS — S61412A Laceration without foreign body of left hand, initial encounter: Secondary | ICD-10-CM | POA: Diagnosis not present

## 2024-10-11 MED ORDER — CEFUROXIME AXETIL 500 MG PO TABS: 500.0000 mg | ORAL_TABLET | Freq: Two times a day (BID) | ORAL | 0 refills | Status: AC

## 2024-10-11 MED ORDER — LIDOCAINE HCL (PF) 1 % IJ SOLN
30.0000 mL | Freq: Once | INTRAMUSCULAR | Status: DC
  Filled 2024-10-11 (×2): qty 30

## 2024-10-11 MED ORDER — TETANUS-DIPHTH-ACELL PERTUSSIS 5-2-15.5 LF-MCG/0.5 IM SUSP
0.5000 mL | Freq: Once | INTRAMUSCULAR | Status: AC
  Administered 2024-10-11: 0.5 mL via INTRAMUSCULAR
  Filled 2024-10-11: qty 0.5

## 2024-10-11 MED ORDER — MORPHINE SULFATE (PF) 4 MG/ML IV SOLN
4.0000 mg | Freq: Once | INTRAVENOUS | Status: AC
  Administered 2024-10-11: 4 mg via INTRAMUSCULAR
  Filled 2024-10-11: qty 1

## 2024-10-11 MED ORDER — METRONIDAZOLE 500 MG PO TABS: 500.0000 mg | ORAL_TABLET | Freq: Three times a day (TID) | ORAL | 0 refills | Status: AC

## 2024-10-11 NOTE — Discharge Instructions (Addendum)
 The stitches in your hand will need to be removed in 7 to 10 days.  Please wash this wound with soap and water daily, pat dry and then wrapped with gauze.  I recommend having a wound check in about 3 days.  Please watch for signs of infection including redness, warmth, swelling, pain or pus.  If this develops please be seen by her primary care provider, urgent care or the emergency department.  I sent some antibiotics for you to take to help prevent infection.  Please take all the medication even if you begin to feel better.  Return to the ED with any worsening symptoms.

## 2024-10-11 NOTE — ED Triage Notes (Signed)
 Bitten by dog this morning at 0400.  Laceration to palm left and finger.  Dog up to date on vaccines.  Dog is shiatzu

## 2024-10-11 NOTE — ED Notes (Signed)
Animal control notified 

## 2024-10-11 NOTE — ED Provider Notes (Signed)
 [facility] Provider Note    Event Date/Time   First MD Initiated Contact with Patient 10/11/24 [phone]     (approximate)   History   Animal Bite   HPI  Tamara [name] is a 78 y.o. female with PMH of Crohn's disease, COPD and as listed in her chart who presents for evaluation of a dog bite to the left hand. Patient was breaking up a fight between her own dogs who are up to date on vaccinations. She reports significant pain to her hand.       Physical Exam   Triage Vital Signs: ED Triage Vitals  Encounter Vitals Group     BP 10/11/24 0932 136/80     Girls Systolic BP Percentile --      Girls Diastolic BP Percentile --      Boys Systolic BP Percentile --      Boys Diastolic BP Percentile --      Pulse Rate 10/11/24 0932 (!) 117     Resp 10/11/24 0932 16     Temp 10/11/24 0932 98.2 F (36.8 C)     Temp Source 10/11/24 0932 Oral     SpO2 10/11/24 0932 99 %     Weight 10/11/24 0933 84 lb 14 oz (38.5 kg)     Height --      Head Circumference --      Peak Flow --      Pain Score 10/11/24 0933 10     Pain Loc --      Pain Education --      Exclude from Growth Chart --     Most recent vital signs: Vitals:   10/11/24 0932  BP: 136/80  Pulse: (!) 117  Resp: 16  Temp: 98.2 F (36.8 C)  SpO2: 99%   General: Awake, no distress.  CV:  Good peripheral perfusion.  Resp:  Normal effort.  Abd:  No distention.  Other:  Laceration to the tip of the ring finger, large laceration/skin tear over the dorsal left hand, smaller lacerations and skin tear on the ulnar palmar side of the hand, patient able to fully flex and extend the fingers although this does elicit significant pain. Radial pulse 2+ and regular, patient neurovascularly intact in all finger tips   ED Results / Procedures / Treatments   Labs (all labs ordered are listed, but only abnormal results are displayed) Labs Reviewed - No data to display  RADIOLOGY  Xray of the left hand  obtained, I interpreted the images as well as reviewed the radiologist report which was negative for fractures and retained foreign bodies.   PROCEDURES:  Critical Care performed: No  .Laceration Repair  Date/Time: 10/11/2024 2:55 PM  Performed by: Cleaster Tinnie LABOR, PA-C Authorized by: Cleaster Tinnie LABOR, PA-C   Consent:    Consent obtained:  Verbal   Consent given by:  Patient   Risks, benefits, and alternatives were discussed: yes     Risks discussed:  Infection, pain, poor cosmetic result, poor wound healing and need for additional repair   Alternatives discussed:  No treatment Universal protocol:    Patient identity confirmed:  Verbally with patient Anesthesia:    Anesthesia method:  Local infiltration   Local anesthetic:  Lidocaine  1% w/o epi Laceration details:    Location:  Hand   Hand location:  L hand, dorsum   Length (cm):  9   Depth (mm):  5 Pre-procedure details:    Preparation:  Patient was  prepped and draped in usual sterile fashion Exploration:    Limited defect created (wound extended): no     Hemostasis achieved with:  Direct pressure   Imaging obtained: x-ray     Imaging outcome: foreign body not noted     Wound exploration: entire depth of wound visualized     Contaminated: yes   Treatment:    Area cleansed with:  Chlorhexidine    Amount of cleaning:  Extensive (Patient soaked her hand in soapy water for at least 15 minutes.)   Irrigation solution:  Sterile saline   Irrigation method:  Syringe Skin repair:    Repair method:  Sutures   Suture size:  5-0   Suture material:  Nylon (Ethilon)   Suture technique:  Simple interrupted   Number of sutures:  11 Approximation:    Approximation:  Loose Repair type:    Repair type:  Intermediate Post-procedure details:    Dressing:  Bulky dressing   Procedure completion:  Tolerated well, no immediate complications .Laceration Repair  Date/Time: 10/11/2024 2:58 PM  Performed by: Cleaster Tinnie LABOR,  PA-C Authorized by: Cleaster Tinnie LABOR, PA-C   Consent:    Consent obtained:  Verbal   Consent given by:  Patient   Risks, benefits, and alternatives were discussed: yes     Risks discussed:  Infection, poor cosmetic result, pain and poor wound healing   Alternatives discussed:  No treatment Universal protocol:    Patient identity confirmed:  Verbally with patient Anesthesia:    Anesthesia method:  Local infiltration   Local anesthetic:  Lidocaine  1% w/o epi Laceration details:    Location:  Finger   Finger location:  L ring finger   Length (cm):  1.5   Depth (mm):  5 Pre-procedure details:    Preparation:  Patient was prepped and draped in usual sterile fashion Exploration:    Hemostasis achieved with:  Direct pressure   Imaging obtained: x-ray     Imaging outcome: foreign body not noted     Wound exploration: entire depth of wound visualized     Contaminated: yes   Treatment:    Area cleansed with:  Povidone-iodine   Amount of cleaning:  Standard   Irrigation solution:  Sterile saline   Irrigation method:  Syringe Skin repair:    Repair method:  Sutures   Suture size:  5-0   Suture material:  Nylon   Suture technique:  Simple interrupted   Number of sutures:  2 Approximation:    Approximation:  Loose Repair type:    Repair type:  Intermediate Post-procedure details:    Dressing:  Adhesive bandage   Procedure completion:  Tolerated well, no immediate complications    MEDICATIONS ORDERED IN ED: Medications  lidocaine  (PF) (XYLOCAINE ) 1 % injection 30 mL (has no administration in time range)  Tdap (ADACEL) injection 0.5 mL (0.5 mLs Intramuscular Given 10/11/24 1113)  morphine (PF) 4 MG/ML injection 4 mg (4 mg Intramuscular Given 10/11/24 1113)     IMPRESSION / MDM / ASSESSMENT AND PLAN / ED COURSE  I reviewed the triage vital signs and the nursing notes.                             78 year old female presents for evaluation of a dog bite to the left hand.  Patient was tachycardic on initial presentation and she is very uncomfortable on exam. VSS otherwise.   Differential diagnosis includes, but is  not limited to, need for tetanus prophylaxis, laceration, fracture, tendon injury, vascular injury, nerve injury.   Patient's presentation is most consistent with acute complicated illness / injury requiring diagnostic workup.  Xray of the hand obtianed to rule out underlying fracture and retained foreign body, was negative.   Patient's dogs were up-to-date on their shots so no concern for rabies today.  Patient has not had a tetanus shot recently so we will update this today.  Patient given a shot of morphine given her significant level of pain.  The laceration to patient's ring fingertip was closed with sutures.  The laceration to the dorsal side of her hand was closed with both sutures and Steri-Strips.  The smaller abrasions and lacerations were closed with Steri-Strips.  Did explain to patient this is very high risk for infection so we will place patient on oral antibiotics. She just finished an antibiotic course of Augmentin  for a URI yesterday so we will place her on Flagyl and Ceftin instead.  Discussed being seen by orthopedics for a wound check.  Patient is well-established with Dr. Edie so she would prefer to see him.  We discussed wound care.  She voiced understanding, all questions were answered and she stable at discharge.      FINAL CLINICAL IMPRESSION(S) / ED DIAGNOSES   Final diagnoses:  Dog bite, initial encounter  Laceration of left hand without foreign body, initial encounter     Rx / DC Orders   ED Discharge Orders          Ordered    metroNIDAZOLE (FLAGYL) 500 MG tablet  3 times daily        10/11/24 1312    cefUROXime (CEFTIN) 500 MG tablet  2 times daily with meals        10/11/24 1312             Note:  This document was prepared using Dragon voice recognition software and may include unintentional dictation  errors.   Cleaster Tinnie LABOR, PA-C 10/11/24 1506    Arlander Charleston, MD 10/12/24 0700

## 2024-10-11 NOTE — ED Notes (Signed)
 Pt states her dog got her this morning and has lacs to left hand and finger area. Bandage in place and bleeding controlled at this time.

## 2024-10-14 DIAGNOSIS — W540XXA Bitten by dog, initial encounter: Secondary | ICD-10-CM | POA: Diagnosis not present

## 2024-10-14 DIAGNOSIS — S61412A Laceration without foreign body of left hand, initial encounter: Secondary | ICD-10-CM | POA: Diagnosis not present

## 2024-10-15 ENCOUNTER — Other Ambulatory Visit: Payer: Self-pay | Admitting: Internal Medicine

## 2024-10-21 DIAGNOSIS — W540XXA Bitten by dog, initial encounter: Secondary | ICD-10-CM | POA: Diagnosis not present

## 2024-10-21 DIAGNOSIS — S61219A Laceration without foreign body of unspecified finger without damage to nail, initial encounter: Secondary | ICD-10-CM | POA: Diagnosis not present

## 2024-10-22 ENCOUNTER — Ambulatory Visit
Admission: RE | Admit: 2024-10-22 | Discharge: 2024-10-22 | Disposition: A | Source: Ambulatory Visit | Attending: Internal Medicine | Admitting: Internal Medicine

## 2024-10-22 DIAGNOSIS — R140 Abdominal distension (gaseous): Secondary | ICD-10-CM | POA: Diagnosis not present

## 2024-10-22 DIAGNOSIS — Z0389 Encounter for observation for other suspected diseases and conditions ruled out: Secondary | ICD-10-CM | POA: Diagnosis not present

## 2024-10-22 MED ORDER — TECHNETIUM TC 99M SULFUR COLLOID
1.8700 | Freq: Once | INTRAVENOUS | Status: AC | PRN
  Administered 2024-10-22: 1.87 via ORAL

## 2024-10-26 ENCOUNTER — Other Ambulatory Visit: Payer: Self-pay | Admitting: Internal Medicine

## 2024-10-28 DIAGNOSIS — S61219A Laceration without foreign body of unspecified finger without damage to nail, initial encounter: Secondary | ICD-10-CM | POA: Diagnosis not present

## 2024-10-28 DIAGNOSIS — W540XXD Bitten by dog, subsequent encounter: Secondary | ICD-10-CM | POA: Diagnosis not present

## 2024-12-14 ENCOUNTER — Other Ambulatory Visit: Payer: Self-pay | Admitting: Internal Medicine
